# Patient Record
Sex: Male | Born: 1955
Health system: Southern US, Community
[De-identification: ages and names within clinical notes are randomized; demographics above are authoritative.]

## PROBLEM LIST (undated history)

## (undated) DIAGNOSIS — E119 Type 2 diabetes mellitus without complications: Secondary | ICD-10-CM

## (undated) DIAGNOSIS — T8859XA Other complications of anesthesia, initial encounter: Secondary | ICD-10-CM

## (undated) DIAGNOSIS — R011 Cardiac murmur, unspecified: Secondary | ICD-10-CM

## (undated) DIAGNOSIS — U071 COVID-19: Secondary | ICD-10-CM

## (undated) DIAGNOSIS — E785 Hyperlipidemia, unspecified: Secondary | ICD-10-CM

## (undated) DIAGNOSIS — G4730 Sleep apnea, unspecified: Secondary | ICD-10-CM

## (undated) DIAGNOSIS — I10 Essential (primary) hypertension: Secondary | ICD-10-CM

## (undated) HISTORY — PX: EYE SURGERY: SHX253

## (undated) HISTORY — DX: Type 2 diabetes mellitus without complications: E11.9

## (undated) HISTORY — DX: Cardiac murmur, unspecified: R01.1

## (undated) HISTORY — PX: CORNEAL TRANSPLANT: SHX108

## (undated) HISTORY — DX: Sleep apnea, unspecified: G47.30

## (undated) HISTORY — DX: Essential (primary) hypertension: I10

## (undated) HISTORY — DX: Hyperlipidemia, unspecified: E78.5

---

## 2004-05-31 ENCOUNTER — Ambulatory Visit: Payer: Self-pay | Admitting: Family Medicine

## 2004-06-11 ENCOUNTER — Encounter: Payer: Self-pay | Admitting: Family Medicine

## 2006-06-09 ENCOUNTER — Encounter: Payer: Self-pay | Admitting: Anesthesiology

## 2006-06-11 ENCOUNTER — Encounter: Payer: Self-pay | Admitting: Anesthesiology

## 2007-01-26 ENCOUNTER — Encounter: Payer: Self-pay | Admitting: General Practice

## 2007-02-11 ENCOUNTER — Encounter: Payer: Self-pay | Admitting: General Practice

## 2007-02-16 ENCOUNTER — Ambulatory Visit: Payer: Self-pay | Admitting: Family Medicine

## 2007-03-11 ENCOUNTER — Encounter: Payer: Self-pay | Admitting: General Practice

## 2007-12-21 ENCOUNTER — Ambulatory Visit: Payer: Self-pay | Admitting: Family Medicine

## 2008-01-09 ENCOUNTER — Ambulatory Visit: Payer: Self-pay | Admitting: Nephrology

## 2008-03-26 ENCOUNTER — Ambulatory Visit: Payer: Self-pay | Admitting: Family Medicine

## 2012-12-11 ENCOUNTER — Ambulatory Visit: Payer: Self-pay | Admitting: Unknown Physician Specialty

## 2012-12-11 LAB — BASIC METABOLIC PANEL
Anion Gap: 7 (ref 7–16)
BUN: 28 mg/dL — ABNORMAL HIGH (ref 7–18)
Calcium, Total: 9.2 mg/dL (ref 8.5–10.1)
Chloride: 100 mmol/L (ref 98–107)
Co2: 25 mmol/L (ref 21–32)
Creatinine: 1.58 mg/dL — ABNORMAL HIGH (ref 0.60–1.30)
EGFR (African American): 56 — ABNORMAL LOW
EGFR (Non-African Amer.): 48 — ABNORMAL LOW
Glucose: 396 mg/dL — ABNORMAL HIGH (ref 65–99)
Osmolality: 287 (ref 275–301)
Potassium: 3.7 mmol/L (ref 3.5–5.1)
Sodium: 132 mmol/L — ABNORMAL LOW (ref 136–145)

## 2012-12-14 ENCOUNTER — Encounter: Payer: Self-pay | Admitting: *Deleted

## 2012-12-17 ENCOUNTER — Encounter: Payer: Self-pay | Admitting: Cardiovascular Disease

## 2012-12-17 ENCOUNTER — Encounter: Payer: Self-pay | Admitting: *Deleted

## 2012-12-17 ENCOUNTER — Ambulatory Visit (INDEPENDENT_AMBULATORY_CARE_PROVIDER_SITE_OTHER): Payer: BC Managed Care – PPO | Admitting: Cardiovascular Disease

## 2012-12-17 ENCOUNTER — Encounter (INDEPENDENT_AMBULATORY_CARE_PROVIDER_SITE_OTHER): Payer: Self-pay

## 2012-12-17 VITALS — BP 138/94 | HR 76 | Ht 76.0 in | Wt 336.5 lb

## 2012-12-17 DIAGNOSIS — I10 Essential (primary) hypertension: Secondary | ICD-10-CM

## 2012-12-17 DIAGNOSIS — Z01818 Encounter for other preprocedural examination: Secondary | ICD-10-CM

## 2012-12-17 NOTE — Assessment & Plan Note (Addendum)
Trevor [name] presents for preoperative evaluation prior to having urologic surgery. Has history of hypertension, diabetes mellitus and hyperlipidemia. His son have a mildly abnormal EKG. His EKG reveals mild left ventricular hypertrophy. He was also found to have possible incomplete right bundle branch block.  He has no cardiac symptoms.  He's had an echocardiogram which reveals moderate left ventricular hypertrophy but otherwise the echocardiogram was fairly unremarkable.  We did discuss at some length a healthy diet and exercise regimen. I've given him some guidelines for exercise program and also some some dietary changes. He will followup with his general medical Dr. Diamantina Providence see him on an as-needed basis.   he is at low risk for his upcoming circumcision.

## 2012-12-17 NOTE — Progress Notes (Signed)
     Trevor [name] Date of Birth  15-Sep-1955       [facility] Office [address]  [address] [place], [place]  [address]   [place], [place]  [address] [phone]     [phone]   Fax  [phone]    Fax [phone]  Problem List: 1. Hypertension 2. Diabetes mellitus 3. Hyperlipidemia 4. Obstructive sleep apnea  History of Present Illness:  Trevor [name] is a 57 yo who presents for pre-op  Evaluation prior to having urologic surgery. He has a history of hypertension, diabetes and hyperlipidemia.  He's able to do all of his normal activities without any difficulties.  He does not get any regular exercise. 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  He eats a fairly unrestricted diet.    No current outpatient prescriptions on file prior to visit.   No current facility-administered medications on file prior to visit.    No Active Allergies  Past Medical History  Diagnosis Date  . Hyperlipidemia   . Diabetes mellitus without complication   . Heart murmur   . Hypertension   . Sleep apnea     Past Surgical History  Procedure Laterality Date  . Corneal transplant      History  Smoking status  . Never Smoker   Smokeless tobacco  . Not on file    History  Alcohol Use No    Family History  Problem Relation Age of Onset  . Family history unknown: Yes    Reviw of Systems:  Reviewed in the HPI.  All other systems are negative.  Physical Exam: [name] pressure 138/94, pulse 76, height 6\' 4"  (1.93 m), weight 336 lb 8 oz (152.635 kg). General: Well developed, well nourished, in no acute distress.  Head: Normocephalic, atraumatic, sclera non-icteric, mucus membranes are moist,   Neck: Supple. Carotids are 2 + without bruits. No JVD   Lungs: Clear   Heart: RR, normal S1, S2  Abdomen: Soft, non-tender, non-distended with normal bowel sounds.  Msk:  Strength and tone are normal   Extremities: No clubbing  or cyanosis. No edema.  Distal pedal pulses are 2+ and equal   Neuro: CN II - XII intact.  Alert and oriented X 3.   Psych:  Normal   ECG: Dec. 8,2014:  NSR at 77,  LAD   Assessment / Plan:

## 2012-12-17 NOTE — Patient Instructions (Addendum)
  Follow up as needed     [facility] Low Glycemic Diet (Source: [facility], 2006) Low Glycemic Foods (20-49) (Decrease risk of developing heart disease) Breakfast Cereals: All-Bran All-Bran Fruit 'n Oats Fiber One Oatmeal (not instant) Oat bran Fruits and fruit juices: (Limit to 1-2 servings per day) Apples Apricots (fresh & dried) Blackberries Blueberries Cherries Cranberries Peaches Pears Plums Prunes Grapefruit Raspberries Strawberries Tangerine Apple juice Grapefruit juice Tomato juice Beans and legumes (fresh-cooked): Black-eyed peas Butter beans Chick peas Lentils  Green beans Lima beans Kidney beans Navy beans Pinto beans Snow peas Non-starchy vegetables: Asparagus, avocado, broccoli, cabbage, cauliflower, celery, cucumber, greens, lettuce, mushrooms, peppers, tomatoes, okra, onions, spinach, summer squash Grains: Barley Bulgur Rye Wild rice Nuts and oils : Almonds Peanuts Sunflower seeds Hazelnuts Pecans Walnuts Oils that are liquid at room temperature Dairy, fish, meat, soy, and eggs: Milk, skim Lowfat cheese Yogurt, lowfat, fruit sugar sweetened Lean red meat Fish  Skinless chicken & [place] Shellfish Egg whites (up to 3 daily) Soy products  Egg yolks (up to 7 or _____ per week) Moderate Glycemic Foods (50-69) Breakfast Cereals: Bran Buds Bran Chex Just Right Mini-Wheats  Special K Swiss muesli Fruits: Banana (under-ripe) Dates Figs Grapes Kiwi Mango Oranges Raisins Fruit Juices: Cranberry juice Orange juice Beans and legumes: Boston-type baked beans Canned pinto, kidney, or navy beans Green peas Vegetables: Beets Carrots  Sweet potato Yam Corn on the cob Breads: Pita (pocket) bread Oat bran bread Pumpernickel bread Rye bread Wheat bread, high fiber  Grains: Cornmeal Rice, brown Rice, white Couscous Pasta: Macaroni Pizza, cheese Ravioli, meat filled Spaghetti, white  Nuts: Cashews Macadamia Snacks: Chocolate  Ice cream, lowfat Muffin Popcorn High Glycemic Foods (70-100)  Breakfast Cereals: Cheerios Corn Chex Corn Flakes Cream of Wheat Grape Nuts Grape Nut Flakes Grits Nutri-Grain Puffed Rice Puffed Wheat Rice Chex Rice Krispies Shredded Wheat Team Total Fruits: Pineapple Watermelon Banana (over-ripe) Beverages: Sodas, sweet tea, pineapple juice Vegetables: Potato, baked, boiled, fried, mashed [place] fries Canned or frozen corn Parsnips Winter squash Breads: Most breads (white and whole grain) Bagels Bread sticks Bread stuffing Kaiser roll Dinner rolls Grains: Rice, instant Tapioca, with milk Candy and most cookies Snacks: Donuts Corn chips Jelly beans Pretzels Pastries

## 2012-12-21 ENCOUNTER — Ambulatory Visit: Payer: Self-pay | Admitting: Urology

## 2013-01-04 ENCOUNTER — Encounter: Payer: Self-pay | Admitting: *Deleted

## 2013-05-10 ENCOUNTER — Ambulatory Visit (INDEPENDENT_AMBULATORY_CARE_PROVIDER_SITE_OTHER): Payer: BC Managed Care – PPO | Admitting: Podiatry

## 2013-05-10 ENCOUNTER — Encounter: Payer: Self-pay | Admitting: Podiatry

## 2013-05-10 VITALS — Resp 16 | Ht 76.0 in | Wt 333.0 lb

## 2013-05-10 DIAGNOSIS — M79609 Pain in unspecified limb: Secondary | ICD-10-CM

## 2013-05-10 DIAGNOSIS — Q828 Other specified congenital malformations of skin: Secondary | ICD-10-CM

## 2013-05-10 DIAGNOSIS — E1159 Type 2 diabetes mellitus with other circulatory complications: Secondary | ICD-10-CM

## 2013-05-10 DIAGNOSIS — B351 Tinea unguium: Secondary | ICD-10-CM

## 2013-05-10 DIAGNOSIS — M2010 Hallux valgus (acquired), unspecified foot: Secondary | ICD-10-CM

## 2013-05-10 NOTE — Progress Notes (Signed)
   Subjective:    Patient ID: Trevor [name], male    DOB: [birth year], 58 y.o.   MRN: [id number]  HPI Comments: i need my toenails and callus trimmed. My toenails do not hurt but my callus does hurt. They have been growing out for years. i shave the callus off and i have pedicures.      Review of Systems  Constitutional:       Weight change  All other systems reviewed and are negative.      Objective:   Physical Exam        Assessment & Plan:

## 2013-05-12 NOTE — Progress Notes (Signed)
Subjective:     Patient ID: Trevor [name], male   DOB: 23-Aug-1955, 58 y.o.   MRN: [id number]  HPI patient presents stating that he is a diabetic and cannot take care of his nails or his calluses and they become bothersome and make it hard for him to walk. Also complains of structural bunion deformity right over left it can bother him at different times and that this is been an ongoing issue   Review of Systems  All other systems reviewed and are negative.      Objective:   Physical Exam  Nursing note and vitals reviewed. Constitutional: He is oriented to person, place, and time.  Cardiovascular: Intact distal pulses.   Musculoskeletal: Normal range of motion.  Neurological: He is oriented to person, place, and time.  Skin: Skin is dry.   patient is found to have diminishment of circulatory status DP and PT pulses with digits well perfused and slight diminishment in hair growth. Neurological there is mild diminishment of sharp toe vibratory and there is found to be normal range of motion and slightly reduce muscle strength of all extensors and flexors. 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 I have recommended debridement which was accomplished of the lesions on both feet and nailbeds 1-5 both feet with no bleeding noted and long-term diabetic shoes to try to prevent him from ulcerating. He is casted for diabetic shoes

## 2013-06-21 ENCOUNTER — Ambulatory Visit (INDEPENDENT_AMBULATORY_CARE_PROVIDER_SITE_OTHER): Payer: BC Managed Care – PPO | Admitting: *Deleted

## 2013-06-21 ENCOUNTER — Encounter: Payer: Self-pay | Admitting: *Deleted

## 2013-06-21 VITALS — BP 167/97 | HR 81 | Resp 16

## 2013-06-21 DIAGNOSIS — E1159 Type 2 diabetes mellitus with other circulatory complications: Secondary | ICD-10-CM

## 2013-06-21 DIAGNOSIS — M2010 Hallux valgus (acquired), unspecified foot: Secondary | ICD-10-CM

## 2013-06-21 NOTE — Progress Notes (Signed)
Pt presents to pick up diabetic shoes. Shoes do fit patient properly.

## 2013-09-13 ENCOUNTER — Ambulatory Visit: Payer: BC Managed Care – PPO | Admitting: Podiatry

## 2013-09-24 DIAGNOSIS — M79609 Pain in unspecified limb: Secondary | ICD-10-CM

## 2013-12-13 ENCOUNTER — Ambulatory Visit: Payer: BC Managed Care – PPO | Admitting: Podiatry

## 2014-05-02 NOTE — Op Note (Signed)
PATIENT NAME:  AKAI, DOLLARD MR#:  [id number] DATE OF BIRTH:  11/15/1955  DATE OF PROCEDURE:  12/21/2012   PREOPERATIVE DIAGNOSIS:  Phimosis.   POSTOPERATIVE DIAGNOSIS:  Phimosis.  PROCEDURE:  Adult circumcision.   ANESTHESIA: General.   DESCRIPTION OF PROCEDURE:  The patient was sterilely prepped and draped in the supine position to do a circumferential block in the base of the penis. I marked the area to be excised from his foreskin. Grouped the foreskin making it very flat, around circumferentially and then with a sharp knife and a #15 Bard-Parker blade circumferential incision on the skin. I then incised around the lateral bands all the way around so I can remove the underlying fascia and foreskin. Foreskin is not sent for pathology. Bleeding is controlled with electrocautery. The subcoronal mucosa is reattached to the skin of the penis utilizing interrupted 2-0 chromic sutures. A sterile dressing was placed and the patient sent to recovery room in satisfactory condition.    ____________________________ Janice Coffin. Elnoria Howard, [place] rdh:ce D: 12/21/2012 19:40:47 ET T: 12/21/2012 20:01:05 ET JOB#: [id number]  cc: Janice Coffin. Elnoria Howard, DO, <Dictator> RICHARD D HART DO ELECTRONICALLY SIGNED 12/28/2012 15:48

## 2015-02-13 DIAGNOSIS — M48061 Spinal stenosis, lumbar region without neurogenic claudication: Secondary | ICD-10-CM | POA: Insufficient documentation

## 2015-03-27 ENCOUNTER — Encounter (INDEPENDENT_AMBULATORY_CARE_PROVIDER_SITE_OTHER): Payer: Self-pay

## 2015-03-27 ENCOUNTER — Ambulatory Visit: Payer: Self-pay

## 2016-08-09 ENCOUNTER — Telehealth: Payer: Self-pay | Admitting: Pharmacist

## 2016-08-09 NOTE — Telephone Encounter (Signed)
08/09/16 Faxed Novo Nordisk form for Dose Change on Levemir Vials Max daily dose 120 units, Inject 60 units under the skin two times daily, # 15 vials. [name]

## 2016-09-01 ENCOUNTER — Ambulatory Visit: Payer: Self-pay | Admitting: Pharmacist

## 2016-09-01 ENCOUNTER — Encounter: Payer: Self-pay | Admitting: Pharmacist

## 2016-09-01 ENCOUNTER — Encounter (INDEPENDENT_AMBULATORY_CARE_PROVIDER_SITE_OTHER): Payer: Self-pay

## 2016-09-01 VITALS — BP 140/79 | Wt 326.0 lb

## 2016-09-01 DIAGNOSIS — Z79899 Other long term (current) drug therapy: Secondary | ICD-10-CM

## 2016-09-01 NOTE — Progress Notes (Signed)
[facility] Visit Note  Patient: Trevor [name] MRN: [id number] Date of Birth: [birth year] PCP: [name], MD   Trevor [name] 61 y.o. male presents for an initial MTM visit today.  BP 140/79   Wt (!) 326 lb (147.9 kg)   BMI 39.68 kg/m   Patient Information   Past Medical History:  Diagnosis Date  . Diabetes mellitus without complication ([place])   . Heart murmur   . Hyperlipidemia   . Hypertension   . Sleep apnea       Past Surgical History:  Procedure Laterality Date  . CORNEAL TRANSPLANT      No family history on file.  New Diagnoses (since last visit): n/a   Lifestyle Diet: Breakfast:over easy eggs with brown rice Lunch:skips Dinner:chicken and veggies, summer squash, zucchni, onions Drinks:kool-aid w/stevia, soda    Current Exercise Habits: The patient does not participate in regular exercise at present       History  Alcohol Use No      History  Smoking Status  . Never Smoker  Smokeless Tobacco  . Never Used      Health Maintenance  Topic Date Due  . HEMOGLOBIN A1C  October 28, 1955  . Hepatitis C Screening  11-May-1955  . PNEUMOCOCCAL POLYSACCHARIDE VACCINE (1) 05/20/1958  . FOOT EXAM  05/20/1966  . OPHTHALMOLOGY EXAM  05/20/1966  . HIV Screening  05/20/1971  . TETANUS/TDAP  05/20/1975  . COLONOSCOPY  05/20/2006  . INFLUENZA VACCINE  08/10/2016   Outpatient Encounter Prescriptions as of 09/01/2016  Medication Sig  . atenolol (TENORMIN) 100 MG tablet Take 100 mg by mouth daily.  [name] atorvastatin (LIPITOR) 10 MG tablet Take 10 mg by mouth at bedtime.  . cyclobenzaprine (FLEXERIL) 10 MG tablet Take 10 mg by mouth 3 (three) times daily as needed for muscle spasms.  . fluticasone (FLONASE) 50 MCG/ACT nasal spray Place 2 sprays into both nostrils daily.  . furosemide (LASIX) 40 MG tablet Take 40 mg by mouth as needed.  [name] glipiZIDE (GLUCOTROL) 10 MG tablet Take 10 mg by mouth 2 (two) times daily.  . hydrochlorothiazide  (HYDRODIURIL) 25 MG tablet Take 25 mg by mouth daily.  . insulin detemir (LEVEMIR) 100 UNIT/ML injection Inject 60 Units into the skin 2 (two) times daily.  [name] losartan (COZAAR) 25 MG tablet Take 100 mg by mouth daily.  . meloxicam (MOBIC) 15 MG tablet Take 15 mg by mouth daily.  . metFORMIN (GLUCOPHAGE) 1000 MG tablet Take 1,000 mg by mouth 2 (two) times daily.  [name] NIFEdipine (PROCARDIA XL/ADALAT-CC) 90 MG 24 hr tablet Take 90 mg by mouth daily.  . potassium chloride SA (K-DUR,KLOR-CON) 20 MEQ tablet Take 20 mEq by mouth 2 (two) times daily.  . traMADol (ULTRAM) 50 MG tablet Take 50 mg by mouth every 6 (six) hours as needed.  . [DISCONTINUED] atenolol (TENORMIN) 100 MG tablet Take 100 mg by mouth daily.  . [DISCONTINUED] furosemide (LASIX) 40 MG tablet Take 40 mg by mouth daily.  . [DISCONTINUED] glipiZIDE (GLUCOTROL) 10 MG tablet Take 10 mg by mouth 2 (two) times daily before a meal.   . [DISCONTINUED] LOSARTAN POTASSIUM PO Take by mouth daily.  . [DISCONTINUED] metFORMIN (GLUCOPHAGE) 1000 MG tablet Take 1,000 mg by mouth 2 (two) times daily with a meal.  . [DISCONTINUED] NIFEdipine (PROCARDIA XL/ADALAT-CC) 90 MG 24 hr tablet   . [DISCONTINUED] potassium chloride SA (K-DUR,KLOR-CON) 20 MEQ tablet Take 20 mEq by mouth 2 (two) times daily.  . [DISCONTINUED] spironolactone (  ALDACTONE) 25 MG tablet Take 25 mg by mouth daily.   No facility-administered encounter medications on file as of 09/01/2016.     Assessment and Plan:  DM: could not find recent A1c. Pt states that he checks BG 2x/day. BG range 180 in the am high 300's twice a week.  HTN: BP today was 140/79 pt at goal BP of <140/90. Stable on regimen of ARB, CCB, HCTZ. Pt denies any SEs such as dizziness or h/a.  HLD: pt on Atrovastatin and pt is on a low-fat diet.  Sleep Apnea: pt states he sleeps about 6 hours a day but it many times has trouble falling asleep. Pt is not on a CPAP, does not have a tv in his room. He does work 3rd shift  and this can contribute to his sleep issues.  Chronic Back Pain: pain today 0/10. Denies discomfort while standing and walking. States that he does  Not get much relief from Tramadol in the past. Has a current RX for prednisone which he states he stopped taking. Counseled pt that prednisone may increase BG but would resolve after treatment is finished. Also reminded pt that he could see some mild swelling also.   Netta Neat, PharmD, [facility]) [phone]

## 2016-09-07 ENCOUNTER — Telehealth: Payer: Self-pay | Admitting: Pharmacy Technician

## 2016-09-07 NOTE — Telephone Encounter (Signed)
Patient eligible to receive medication assistance at [facility] through 2018, as long as eligibility requirements continue to be met.  Zachrey Deutscher J. Tavia Stave Care Manager [facility] 

## 2016-10-14 ENCOUNTER — Telehealth: Payer: Self-pay | Admitting: Pharmacist

## 2016-10-14 NOTE — Telephone Encounter (Signed)
10/14/16 Received patient portion of Novo Nordisk application back, faxed complete application to [employer] for [employer] Max daily dose 120 units--Inject 60 units under the skin two times daily # 150 vials.[name]

## 2016-11-18 ENCOUNTER — Ambulatory Visit: Payer: Self-pay | Admitting: Nurse Practitioner

## 2016-11-22 ENCOUNTER — Ambulatory Visit: Payer: Self-pay | Attending: Nurse Practitioner | Admitting: Nurse Practitioner

## 2016-11-22 ENCOUNTER — Encounter: Payer: Self-pay | Admitting: Nurse Practitioner

## 2016-11-22 ENCOUNTER — Ambulatory Visit: Payer: Self-pay | Admitting: Licensed Clinical Social Worker

## 2016-11-22 VITALS — BP 102/68 | HR 64 | Temp 98.3°F | Resp 18 | Ht 76.0 in | Wt 327.0 lb

## 2016-11-22 DIAGNOSIS — F439 Reaction to severe stress, unspecified: Secondary | ICD-10-CM

## 2016-11-22 DIAGNOSIS — L97519 Non-pressure chronic ulcer of other part of right foot with unspecified severity: Secondary | ICD-10-CM | POA: Insufficient documentation

## 2016-11-22 DIAGNOSIS — Z794 Long term (current) use of insulin: Secondary | ICD-10-CM | POA: Insufficient documentation

## 2016-11-22 DIAGNOSIS — E785 Hyperlipidemia, unspecified: Secondary | ICD-10-CM | POA: Insufficient documentation

## 2016-11-22 DIAGNOSIS — E1165 Type 2 diabetes mellitus with hyperglycemia: Secondary | ICD-10-CM | POA: Insufficient documentation

## 2016-11-22 DIAGNOSIS — E1169 Type 2 diabetes mellitus with other specified complication: Secondary | ICD-10-CM | POA: Insufficient documentation

## 2016-11-22 DIAGNOSIS — Z79899 Other long term (current) drug therapy: Secondary | ICD-10-CM | POA: Insufficient documentation

## 2016-11-22 DIAGNOSIS — G4730 Sleep apnea, unspecified: Secondary | ICD-10-CM | POA: Insufficient documentation

## 2016-11-22 DIAGNOSIS — R011 Cardiac murmur, unspecified: Secondary | ICD-10-CM | POA: Insufficient documentation

## 2016-11-22 DIAGNOSIS — G4733 Obstructive sleep apnea (adult) (pediatric): Secondary | ICD-10-CM | POA: Insufficient documentation

## 2016-11-22 DIAGNOSIS — E13621 Other specified diabetes mellitus with foot ulcer: Secondary | ICD-10-CM

## 2016-11-22 DIAGNOSIS — E11621 Type 2 diabetes mellitus with foot ulcer: Secondary | ICD-10-CM | POA: Insufficient documentation

## 2016-11-22 DIAGNOSIS — L97509 Non-pressure chronic ulcer of other part of unspecified foot with unspecified severity: Secondary | ICD-10-CM

## 2016-11-22 DIAGNOSIS — I10 Essential (primary) hypertension: Secondary | ICD-10-CM | POA: Insufficient documentation

## 2016-11-22 LAB — POCT URINALYSIS DIPSTICK

## 2016-11-22 LAB — POCT GLYCOSYLATED HEMOGLOBIN (HGB A1C): HEMOGLOBIN A1C: 10.2

## 2016-11-22 LAB — GLUCOSE, POCT (MANUAL RESULT ENTRY): POC Glucose: 164 mg/dl — AB (ref 70–99)

## 2016-11-22 MED ORDER — GLUCOSE BLOOD VI STRP: ORAL_STRIP | 12 refills | Status: DC

## 2016-11-22 MED FILL — TRUE METRIX TEST STRIP: 30 days supply | Qty: 100 | Fill #0

## 2016-11-22 NOTE — Patient Instructions (Addendum)
Cholesterol Cholesterol is a fat. Your body needs a small amount of cholesterol. Cholesterol (plaque) may build up in your blood vessels (arteries). That makes you more likely to have a heart attack or stroke. You cannot feel your cholesterol level. Having a blood test is the only way to find out if your level is high. Keep your test results. Work with your doctor to keep your cholesterol at a good level. What do the results mean?  Total cholesterol is how much cholesterol is in your blood.  LDL is bad cholesterol. This is the type that can build up. Try to have low LDL.  HDL is good cholesterol. It cleans your blood vessels and carries LDL away. Try to have high HDL.  Triglycerides are fat that the body can store or burn for energy. What are good levels of cholesterol?  Total cholesterol below 200.  LDL below 100 is good for people who have health risks. LDL below 70 is good for people who have very high risks.  HDL above 40 is good. It is best to have HDL of 60 or higher.  Triglycerides below 150. How can I lower my cholesterol? Diet Follow your diet program as told by your doctor.  Choose fish, white meat chicken, or [place] that is roasted or baked. Try not to eat red meat, fried foods, sausage, or lunch meats.  Eat lots of fresh fruits and vegetables.  Choose whole grains, beans, pasta, potatoes, and cereals.  Choose olive oil, corn oil, or canola oil. Only use small amounts.  Try not to eat butter, mayonnaise, shortening, or palm kernel oils.  Try not to eat foods with trans fats.  Choose low-fat or nonfat dairy foods. ? Drink skim or nonfat milk. ? Eat low-fat or nonfat yogurt and cheeses. ? Try not to drink whole milk or cream. ? Try not to eat ice cream, egg yolks, or full-fat cheeses.  Healthy desserts include angel food cake, ginger snaps, animal crackers, hard candy, popsicles, and low-fat or nonfat frozen yogurt. Try not to eat pastries, cakes, pies, and  cookies.  Exercise Follow your exercise program as told by your doctor.  Be more active. Try gardening, walking, and taking the stairs.  Ask your doctor about ways that you can be more active.  Medicine  Take over-the-counter and prescription medicines only as told by your doctor.  This information is not intended to replace advice given to you by your health care provider. Make sure you discuss any questions you have with your health care provider. Document Released: 03/25/2008 Document Revised: 07/29/2015 Document Reviewed: 07/09/2015 Elsevier Interactive Patient Education  2017 [employer]  Dyslipidemia Dyslipidemia is an imbalance of waxy, fat-like substances (lipids) in the blood. The body needs lipids in small amounts. Dyslipidemia often involves a high level of cholesterol or triglycerides, which are types of lipids. Common forms of dyslipidemia include:  High levels of bad cholesterol (LDL cholesterol). LDL is the type of cholesterol that causes fatty deposits (plaques) to build up in the blood vessels that carry blood away from your heart (arteries).  Low levels of good cholesterol (HDL cholesterol). HDL cholesterol is the type of cholesterol that protects against heart disease. High levels of HDL remove the LDL buildup from arteries.  High levels of triglycerides. Triglycerides are a fatty substance in the blood that is linked to a buildup of plaques in the arteries.  You can develop dyslipidemia because of the genes you are born with (primary dyslipidemia) or changes that  occur during your life (secondary dyslipidemia), or as a side effect of certain medical treatments. What are the causes? Primary dyslipidemia is caused by changes (mutations) in genes that are passed down through families (inherited). These mutations cause several types of dyslipidemia. Mutations can result in disorders that make the body produce too much LDL cholesterol or triglycerides, or not enough HDL  cholesterol. These disorders may lead to heart disease, arterial disease, or stroke at an early age. Causes of secondary dyslipidemia include certain lifestyle choices and diseases that lead to dyslipidemia, such as:  Eating a diet that is high in animal fat.  Not getting enough activity or exercise (having a sedentary lifestyle).  Having diabetes, kidney disease, liver disease, or thyroid disease.  Drinking large amounts of alcohol.  Using certain types of drugs.  What increases the risk? You may be at greater risk for dyslipidemia if you are an older man or if you are a woman who has gone through menopause. Other risk factors include:  Having a family history of dyslipidemia.  Taking certain medicines, including birth control pills, steroids, some diuretics, beta-blockers, and some medicines forHIV.  Smoking cigarettes.  Eating a high-fat diet.  Drinking large amounts of alcohol.  Having certain medical conditions such as diabetes, polycystic ovary syndrome (PCOS), pregnancy, kidney disease, liver disease, or hypothyroidism.  Not exercising regularly.  Being overweight or obese with too much belly fat.  What are the signs or symptoms? Dyslipidemia does not usually cause any symptoms. Very high lipid levels can cause fatty bumps under the skin (xanthomas) or a white or gray ring around the black center (pupil) of the eye. Very high triglyceride levels can cause inflammation of the pancreas (pancreatitis). How is this diagnosed? Your health care provider may diagnose dyslipidemia based on a routine blood test (fasting blood test). Because most people do not have symptoms of the condition, this blood testing (lipid profile) is done on adults age 58 and older and is repeated every 5 years. This test checks:  Total cholesterol. This is a measure of the total amount of cholesterol in your blood, including LDL cholesterol, HDL cholesterol, and triglycerides. A healthy number is  below 200.  LDL cholesterol. The target number for LDL cholesterol is different for each person, depending on individual risk factors. For most people, a number below 100 is healthy. Ask your health care provider what your LDL cholesterol number should be.  HDL cholesterol. An HDL level of 60 or higher is best because it helps to protect against heart disease. A number below 48 for men or below 57 for women increases the risk for heart disease.  Triglycerides. A healthy triglyceride number is below 150.  If your lipid profile is abnormal, your health care provider may do other blood tests to get more information about your condition. How is this treated? Treatment depends on the type of dyslipidemia that you have and your other risk factors for heart disease and stroke. Your health care provider will have a target range for your lipid levels based on this information. For many people, treatment starts with lifestyle changes, such as diet and exercise. Your health care provider may recommend that you:  Get regular exercise.  Make changes to your diet.  Quit smoking if you smoke.  If diet changes and exercise do not help you reach your goals, your health care provider may also prescribe medicine to lower lipids. The most commonly prescribed type of medicine lowers your LDL cholesterol (statin drug). If  you have a high triglyceride level, your provider may prescribe another type of drug (fibrate) or an omega-3 fish oil supplement, or both. Follow these instructions at home:  Take over-the-counter and prescription medicines only as told by your health care provider. This includes supplements.  Get regular exercise. Start an aerobic exercise and strength training program as told by your health care provider. Ask your health care provider what activities are safe for you. Your health care provider may recommend: ? 30 minutes of aerobic activity 4-6 days a week. Brisk walking is an example of  aerobic activity. ? Strength training 2 days a week.  Eat a healthy diet as told by your health care provider. This can help you reach and maintain a healthy weight, lower your LDL cholesterol, and raise your HDL cholesterol. It may help to work with a diet and nutrition specialist (dietitian) to make a plan that is right for you. Your dietitian or health care provider may recommend: ? Limiting your calories, if you are overweight. ? Eating more fruits, vegetables, whole grains, fish, and lean meats. ? Limiting saturated fat, trans fat, and cholesterol.  Follow instructions from your health care provider or dietitian about eating or drinking restrictions.  Limit alcohol intake to no more than one drink per day for nonpregnant women and two drinks per day for men. One drink equals 12 oz of beer, 5 oz of wine, or 1 oz of hard liquor.  Do not use any products that contain nicotine or tobacco, such as cigarettes and e-cigarettes. If you need help quitting, ask your health care provider.  Keep all follow-up visits as told by your health care provider. This is important. Contact a health care provider if:  You are having trouble sticking to your exercise or diet plan.  You are struggling to quit smoking or control your use of alcohol. Summary  Dyslipidemia is an imbalance of waxy, fat-like substances (lipids) in the blood. The body needs lipids in small amounts. Dyslipidemia often involves a high level of cholesterol or triglycerides, which are types of lipids.  Treatment depends on the type of dyslipidemia that you have and your other risk factors for heart disease and stroke.  For many people, treatment starts with lifestyle changes, such as diet and exercise. Your health care provider may also prescribe medicine to lower lipids. This information is not intended to replace advice given to you by your health care provider. Make sure you discuss any questions you have with your health care  provider. Document Released: 01/01/2013 Document Revised: 08/24/2015 Document Reviewed: 08/24/2015 Elsevier Interactive Patient Education  2018 [employer].  How to Avoid Diabetes Mellitus Problems You can take action to prevent or slow down problems that are caused by diabetes (diabetes mellitus). Following your diabetes plan and taking care of yourself can reduce your risk of serious or life-threatening complications. Manage your diabetes  Follow instructions from your health care providers about managing your diabetes. Your diabetes may be managed by a team of health care providers who can teach you how to care for yourself and can answer questions that you have.  Educate yourself about your condition so you can make healthy choices about eating and physical activity.  Check your blood sugar (glucose) levels as often as directed. Your health care provider will help you decide how often to check your blood glucose level depending on your treatment goals and how well you are meeting them.  Ask your health care provider if you should take  low-dose aspirin daily and what dose is recommended for you. Taking low-dose aspirin daily is recommended to help prevent cardiovascular disease. Do not use nicotine or tobacco Do not use any products that contain nicotine or tobacco, such as cigarettes and e-cigarettes. If you need help quitting, ask your health care provider. Nicotine raises your risk for diabetes problems. If you quit using nicotine:  You will lower your risk for heart attack, stroke, nerve disease, and kidney disease.  Your cholesterol and blood pressure may improve.  Your blood circulation will improve.  Keep your blood pressure under control To control your blood pressure:  Follow instructions from your health care provider about meal planning, exercise, and medicines.  Make sure your health care provider checks your blood pressure at every medical visit.  A blood pressure  reading consists of two numbers. Generally, the goal is to keep your top number (systolic pressure) at or below 130, and your bottom number (diastolic pressure) at or below 80. Your health care provider may recommend a lower target blood pressure. Your individualized target blood pressure is determined based on:  Your age.  Your medicines.  How long you have had diabetes.  Any other medical conditions you have.  Keep your cholesterol under control To control your cholesterol:  Follow instructions from your health care provider about meal planning, exercise, and medicines.  Have your cholesterol checked at least once a year.  You may be prescribed medicine to lower cholesterol (statin). If you are not taking a statin, ask your health care provider if you should be.  Controlling your cholesterol may:  Help prevent heart disease and stroke. These are the most common health problems for people with diabetes.  Improve your blood flow.  Schedule and keep yearly physical exams and eye exams Your health care provider will tell you how often you need medical visits depending on your diabetes management plan. Keep all follow-up visits as directed. This is important so possible problems can be identified early and complications can be avoided or treated.  Every visit with your health care provider should include measuring your: ? Weight. ? Blood pressure. ? Blood glucose control.  Your A1c (hemoglobin A1c) level should be checked: ? At least 2 times a year, if you are meeting your treatment goals. ? 4 times a year, if you are not meeting treatment goals or if your treatment goals have changed.  Your blood lipids (lipid profile) should be checked yearly. You should also be checked yearly for protein in your urine (urine microalbumin).  If you have type 1 diabetes, get an eye exam 3-5 years after you are diagnosed, and then once a year after your first exam.  If you have type 2 diabetes,  get an eye exam as soon as you are diagnosed, and then once a year after your first exam.  Keep your vaccines current It is recommended that you receive:  pneumonia (pneumococcal) vaccine and a hepatitis B vaccine. If you are age 103 or older, you may get the pneumonia vaccine as a series of two separate shots.  Ask your health care provider which other vaccines may be recommended. Take care of your feet Diabetes may cause you to have poor blood circulation to your legs and feet. Because of this, taking care of your feet is very important. Diabetes can cause:  The skin on the feet to get thinner, break more easily, and heal more slowly.  Nerve damage in your legs and feet, which results in  decreased feeling. You may not notice minor injuries that could lead to serious problems.  To avoid foot problems:  Check your skin and feet every day for cuts, bruises, redness, blisters, or sores.  Schedule a foot exam with your health care provider once every year. This exam includes: ? Inspecting of the structure and skin of your feet. ? Checking the pulses and sensation in your feet.  Make sure that your health care provider performs a visual foot exam at every medical visit.  Take care of your teeth People with poorly controlled diabetes are more likely to have gum (periodontal) disease. Diabetes can make periodontal diseases harder to control. If not treated, periodontal diseases can lead to tooth loss. To prevent this:  Brush your teeth twice a day.  Floss at least once a day.  Visit your dentist 2 times a year.  Drink responsibly Limit alcohol intake to no more than 1 drink a day for nonpregnant women and 2 drinks a day for men. One drink equals 12 oz of beer, 5 oz of wine, or 1 oz of hard liquor. It is important to eat food when you drink alcohol to avoid low blood glucose (hypoglycemia). Avoid alcohol if you:  Have a history of alcohol abuse or dependence.  Are pregnant.  Have  liver disease, pancreatitis, advanced neuropathy, or severe hypertriglyceridemia.  Lessen stress Living with diabetes can be stressful. When you are experiencing stress, your blood glucose may be affected in two ways:  Stress hormones may cause your blood glucose to rise.  You may be distracted from taking good care of yourself.  Be aware of your stress level and make changes to help you manage challenging situations. To lower your stress levels:  Consider joining a support group.  Do planned relaxation or meditation.  Do a hobby that you enjoy.  Maintain healthy relationships.  Exercise regularly.  Work with your health care provider or a mental health professional.  Summary  You can take action to prevent or slow down problems that are caused by diabetes (diabetes mellitus). Following your diabetes plan and taking care of yourself can reduce your risk of serious or life-threatening complications.  Follow instructions from your health care providers about managing your diabetes. Your diabetes may be managed by a team of health care providers who can teach you how to care for yourself and can answer questions that you have.  Your health care provider will tell you how often you need medical visits depending on your diabetes management plan. Keep all follow-up visits as directed. This is important so possible problems can be identified early and complications can be avoided or treated. This information is not intended to replace advice given to you by your health care provider. Make sure you discuss any questions you have with your health care provider. Document Released: 09/14/2010 Document Revised: 09/26/2015 Document Reviewed: 09/26/2015 Elsevier Interactive Patient Education  2018 [employer].  Hypertension Hypertension is another name for high blood pressure. High blood pressure forces your heart to work harder to pump blood. This can cause problems over time. There are two  numbers in a blood pressure reading. There is a top number (systolic) over a bottom number (diastolic). It is best to have a blood pressure below 120/80. Healthy choices can help lower your blood pressure. You may need medicine to help lower your blood pressure if:  Your blood pressure cannot be lowered with healthy choices.  Your blood pressure is higher than 130/80.  Follow  these instructions at home: Eating and drinking  If directed, follow the DASH eating plan. This diet includes: ? Filling half of your plate at each meal with fruits and vegetables. ? Filling one quarter of your plate at each meal with whole grains. Whole grains include whole wheat pasta, brown rice, and whole grain bread. ? Eating or drinking low-fat dairy products, such as skim milk or low-fat yogurt. ? Filling one quarter of your plate at each meal with low-fat (lean) proteins. Low-fat proteins include fish, skinless chicken, eggs, beans, and tofu. ? Avoiding fatty meat, cured and processed meat, or chicken with skin. ? Avoiding premade or processed food.  Eat less than 1,500 mg of salt (sodium) a day.  Limit alcohol use to no more than 1 drink a day for nonpregnant women and 2 drinks a day for men. One drink equals 12 oz of beer, 5 oz of wine, or 1 oz of hard liquor. Lifestyle  Work with your doctor to stay at a healthy weight or to lose weight. Ask your doctor what the best weight is for you.  Get at least 30 minutes of exercise that causes your heart to beat faster (aerobic exercise) most days of the week. This may include walking, swimming, or biking.  Get at least 30 minutes of exercise that strengthens your muscles (resistance exercise) at least 3 days a week. This may include lifting weights or pilates.  Do not use any products that contain nicotine or tobacco. This includes cigarettes and e-cigarettes. If you need help quitting, ask your doctor.  Check your blood pressure at home as told by your  doctor.  Keep all follow-up visits as told by your doctor. This is important. Medicines  Take over-the-counter and prescription medicines only as told by your doctor. Follow directions carefully.  Do not skip doses of blood pressure medicine. The medicine does not work as well if you skip doses. Skipping doses also puts you at risk for problems.  Ask your doctor about side effects or reactions to medicines that you should watch for. Contact a doctor if:  You think you are having a reaction to the medicine you are taking.  You have headaches that keep coming back (recurring).  You feel dizzy.  You have swelling in your ankles.  You have trouble with your vision. Get help right away if:  You get a very bad headache.  You start to feel confused.  You feel weak or numb.  You feel faint.  You get very bad pain in your: ? Chest. ? Belly (abdomen).  You throw up (vomit) more than once.  You have trouble breathing. Summary  Hypertension is another name for high blood pressure.  Making healthy choices can help lower blood pressure. If your blood pressure cannot be controlled with healthy choices, you may need to take medicine. This information is not intended to replace advice given to you by your health care provider. Make sure you discuss any questions you have with your health care provider. Document Released: 06/15/2007 Document Revised: 11/25/2015 Document Reviewed: 11/25/2015 Elsevier Interactive Patient Education  2018 [place] for Eating Away From Home If You Have Diabetes Controlling your level of blood glucose, also known as blood sugar, can be challenging. It can be even more difficult when you do not prepare your own meals. The following tips can help you manage your diabetes when you eat away from home. Planning ahead Plan ahead if you know you will be eating  away from home:  Ask your health care provider how to time meals and medicine if you are  taking insulin.  Make a list of restaurants near you that offer healthy choices. If they have a carry-out menu, take it home and plan what you will order ahead of time.  Look up the restaurant you want to eat at online. Many chain and fast-food restaurants list nutritional information online. Use this information to choose the healthiest options and to calculate how many carbohydrates will be in your meal.  Use a carbohydrate-counting book or mobile app to look up the carbohydrate content and serving size of the foods you want to eat.  Become familiar with serving sizes and learn to recognize how many servings are in a portion. This will allow you to estimate how many carbohydrates you can eat.  Free foods A "free food" is any food or drink that has less than 5 g of carbohydrates per serving. Free foods include:  Many vegetables.  Hard boiled eggs.  Nuts or seeds.  Olives.  Cheeses.  Meats.  These types of foods make good appetizer choices and are often available at salad bars. Lemon juice, vinegar, or a low-calorie salad dressing of fewer than 20 calories per serving can be used as a "free" salad dressing. Choices to reduce carbohydrates  Substitute nonfat sweetened yogurt with a sugar-free yogurt. Yogurt made from soy milk may also be used, but you will still want a sugar-free or plain option to choose a lower carbohydrate amount.  Ask your server to take away the bread basket or chips from your table.  Order fresh fruit. A salad bar often offers fresh fruit choices. Avoid canned fruit because it is usually packed in sugar or syrup.  Order a salad, and eat it without dressing. Or, create a "free" salad dressing.  Ask for substitutions. For example, instead of [place] fries, request an order of a vegetable such as salad, green beans, or broccoli. Other tips  If you take insulin, take the insulin once your food arrives to your table. This will ensure your insulin and food are  timed correctly.  Ask your server about the portion size before your order, and ask for a take-out box if the portion has more servings than you should have. When your food comes, leave the amount you should have on the plate, and put the rest in the take-out box.  Consider splitting an entree with someone and ordering a side salad. This information is not intended to replace advice given to you by your health care provider. Make sure you discuss any questions you have with your health care provider. Document Released: 12/27/2004 Document Revised: 06/04/2015 Document Reviewed: 03/26/2013 Elsevier Interactive Patient Education  2018 [employer].  Diabetes and Foot Care Diabetes may cause you to have problems because of poor blood supply (circulation) to your feet and legs. This may cause the skin on your feet to become thinner, break easier, and heal more slowly. Your skin may become dry, and the skin may peel and crack. You may also have nerve damage in your legs and feet causing decreased feeling in them. You may not notice minor injuries to your feet that could lead to infections or more serious problems. Taking care of your feet is one of the most important things you can do for yourself. Follow these instructions at home:  Wear shoes at all times, even in the house. Do not go barefoot. Bare feet are easily injured.  Check your feet daily for blisters, cuts, and redness. If you cannot see the bottom of your feet, use a mirror or ask someone for help.  Wash your feet with warm water (do not use hot water) and mild soap. Then pat your feet and the areas between your toes until they are completely dry. Do not soak your feet as this can dry your skin.  Apply a moisturizing lotion or petroleum jelly (that does not contain alcohol and is unscented) to the skin on your feet and to dry, brittle toenails. Do not apply lotion between your toes.  Trim your toenails straight across. Do not dig under  them or around the cuticle. File the edges of your nails with an emery board or nail file.  Do not cut corns or calluses or try to remove them with medicine.  Wear clean socks or stockings every day. Make sure they are not too tight. Do not wear knee-high stockings since they may decrease blood flow to your legs.  Wear shoes that fit properly and have enough cushioning. To break in new shoes, wear them for just a few hours a day. This prevents you from injuring your feet. Always look in your shoes before you put them on to be sure there are no objects inside.  Do not cross your legs. This may decrease the blood flow to your feet.  If you find a minor scrape, cut, or break in the skin on your feet, keep it and the skin around it clean and dry. These areas may be cleansed with mild soap and water. Do not cleanse the area with peroxide, alcohol, or iodine.  When you remove an adhesive bandage, be sure not to damage the skin around it.  If you have a wound, look at it several times a day to make sure it is healing.  Do not use heating pads or hot water bottles. They may burn your skin. If you have lost feeling in your feet or legs, you may not know it is happening until it is too late.  Make sure your health care provider performs a complete foot exam at least annually or more often if you have foot problems. Report any cuts, sores, or bruises to your health care provider immediately. Contact a health care provider if:  You have an injury that is not healing.  You have cuts or breaks in the skin.  You have an ingrown nail.  You notice redness on your legs or feet.  You feel burning or tingling in your legs or feet.  You have pain or cramps in your legs and feet.  Your legs or feet are numb.  Your feet always feel cold. Get help right away if:  There is increasing redness, swelling, or pain in or around a wound.  There is a red line that goes up your leg.  Pus is coming from a  wound.  You develop a fever or as directed by your health care provider.  You notice a bad smell coming from an ulcer or wound. This information is not intended to replace advice given to you by your health care provider. Make sure you discuss any questions you have with your health care provider. Document Released: 12/25/1999 Document Revised: 06/04/2015 Document Reviewed: 06/05/2012 Elsevier Interactive Patient Education  2017 [employer].

## 2016-11-22 NOTE — BH Specialist Note (Signed)
Type of Service: Clinical Social Work Consult  Trevor [name] is a 61 yo male referred by [employer] for stress. Patient was accompanied by self. Patient reports :concerns with Financial difficulties Health problems.  He has type 2 diabetes resulting in foot ulcer. Pt shared that he has been referred to a Podiatrist; however, he is unable to pay the copay of $100. Pt disclosed stress managing physical health and financial strain.   Family & Social:patient lives alone  School / Work /Fun: Pt is employed  Life changes: Pt experiencing stress from management of health and finances Issues discussed: strengthening support system and linking to community resources  Intervention: Screening Tool(s)  Administered, Supportive Counseling and Referral to [employer] ; Solution-Focused Strategies  ASSESSMENT:.Patient may benefit from psychoeducation on correlation between one's physical and mental health. Pt is experiencing increased stress from managing health and financial strain. He is not interested in initiating psychotherapy at this time. Pt is in agreement to scheduling appointment with Financial Counseling to apply for [facility] and [facility] to assist with referrals to specialists. PLAN: 1. Pt will call on Monday, November 19, 18 to schedule appointment with Financial Counseling 2. Pt was encouraged to contact [facility] if symptoms worsen or fail to improve to schedule behavioral appointments at [facility] 3. Referral: Community Resources:  Finances 4. No other services needs at this time

## 2016-11-22 NOTE — Progress Notes (Signed)
P 

## 2016-11-22 NOTE — Progress Notes (Signed)
Assessment & Plan:  Trevor [name] was seen today for establish care.  Diagnoses and all orders for this visit:  Foot ulcer due to secondary DM [facility]) -     Ambulatory referral to Podiatry  Essential hypertension -     Basic metabolic panel -     potassium chloride SA (K-DUR,KLOR-CON) 20 MEQ tablet; Take 1 tablet (20 mEq total) 2 (two) times daily by mouth.  Check blood pressure daily at the same time. Keep a log of all blood pressure readings.  DASH DIET; No salt or low sodium diet  If pressure if greater than 150/100 notify me here at the office.  If it's persistently greater 180/90, go to the Emergency Department.  Take all medication as prescribed.   Avoid smoked meats which are high in sodium content.  Avoid soda which contains sodium and are high in sugar which increases your risk for diabetes. Hyperlipidemia associated with type 2 diabetes mellitus (HCC) -     HgB A1c -     Glucose (CBG) -     Urinalysis Dipstick -     Lipid panel -     CBC -     Microalbumin / creatinine urine ratio Exercise at least 150 minutes per week.  Take all medications as prescribed. Do not skip doses.   Other orders -     glucose blood (TRUE METRIX BLOOD GLUCOSE TEST) test strip; Use as instructed     Subjective:   Chief Complaint  Patient presents with  . Establish Care    Patient is here for ulcer on right foot  Trevor [name] 61 y.o. male presents to office today for follow up and with concerns of right foot ulcer.    Checking fasting blood sugars 120 30 day average 250  Hypertension Patient here for follow-up of hypertension. He is not exercising and is adherent to low salt diet.  He does not have a blood pressure log today.  Blood pressure is well controlled at home.  Cardiac symptoms none. Patient denies chest pain, dyspnea and lower extremity edema.  Cardiovascular risk factors: advanced age (older than 46 for men, 57 for women), diabetes mellitus, dyslipidemia,  hypertension, male gender and obesity (BMI >= 30 kg/m2). Use of agents associated with hypertension: none.  History of target organ damage: none. BP Readings from Last 3 Encounters: 11/22/16 : 102/68 09/01/16 : 140/79 06/21/13 : (!) 167/97   Hyperlipidemia Patient presents for follow up to hyperlipidemia.  He  denies chest pain, dyspnea, exertional chest pressure/discomfort and lower extremity edema or statin intolerance including myalgias.  Lab Results      Component                Value               Date                      CHOL                     192                 11/22/2016           Lab Results      Component                Value               Date  HDL                      40                  11/22/2016           Lab Results      Component                Value               Date                      LDLCALC                  101 (H)             11/22/2016           Lab Results      Component                Value               Date                      TRIG                     257 (H)             11/22/2016           Lab Results      Component                Value               Date                      CHOLHDL                  4.8                 11/22/2016            Diabetes Mellitus Type II Patient here for follow-up of Type 2 diabetes mellitus.  Current symptoms/problems include hyperglycemia and have been improving.  Known diabetic complications: cardiovascular disease Eye exam current (within one year): no Weight trend: stable Current monitoring regimen: home blood tests - daily Home blood sugar records: fasting range: 140 Any episodes of hypoglycemia? no Is He on ACE inhibitor or angiotensin II receptor blocker?  Yes  Lab Results      Component                Value               Date                      HGBA1C                   10.2                11/22/2016                Foot Ulcer: Patient complains of a foot ulcer. Reports he has  very dry calloused feet. He was attempting to remove some of the calloused area from the plantar side of his foot with a pocket knife. He attempted to remove too much skin and his foot immediately began bleeding profusely.  He was seen in the ED and treated. Today his foot has healed but still has some bruising under the skin and there still remains a moderate amount of raised calloused area. The patient has had an ulcer of right plantar foot for several weeks. This has been treated in the hospital with silvadene dressing changes. Past history significant for diabetes, morbid obesity and peripheral vascular disease. The patient  denies drainage, fever, pain and redness. Patient does have a history of diabetes mellitus.    Past Medical History:  Diagnosis Date  . Diabetes mellitus without complication ([place])   . Heart murmur   . Hyperlipidemia   . Hypertension   . Sleep apnea     Past Surgical History:  Procedure Laterality Date  . CORNEAL TRANSPLANT      History reviewed. No pertinent family history.  Social History   Socioeconomic History  . Marital status: Single    Spouse name: Not on file  . Number of children: Not on file  . Years of education: Not on file  . Highest education level: Not on file  Social Needs  . Financial resource strain: Not on file  . Food insecurity - worry: Not on file  . Food insecurity - inability: Not on file  . Transportation needs - medical: Not on file  . Transportation needs - non-medical: Not on file  Occupational History  . Not on file  Tobacco Use  . Smoking status: Never Smoker  . Smokeless tobacco: Never Used  Substance and Sexual Activity  . Alcohol use: No  . Drug use: No  . Sexual activity: Not on file  Other Topics Concern  . Not on file  Social History Narrative  . Not on file    Outpatient Medications Prior to Visit  Medication Sig Dispense Refill  . atenolol (TENORMIN) 100 MG tablet Take 100 mg by mouth daily.    .  fluticasone (FLONASE) 50 MCG/ACT nasal spray Place 2 sprays into both nostrils daily.    . furosemide (LASIX) 40 MG tablet Take 40 mg by mouth as needed.    [name] glipiZIDE (GLUCOTROL) 10 MG tablet Take 10 mg by mouth 2 (two) times daily.    . insulin detemir (LEVEMIR) 100 UNIT/ML injection Inject 60 Units into the skin 2 (two) times daily.    [name] losartan (COZAAR) 25 MG tablet Take 100 mg by mouth daily.    . metFORMIN (GLUCOPHAGE) 1000 MG tablet Take 1,000 mg by mouth 2 (two) times daily.    [name] NIFEdipine (PROCARDIA XL/ADALAT-CC) 90 MG 24 hr tablet Take 90 mg by mouth daily.    . hydrochlorothiazide (HYDRODIURIL) 25 MG tablet Take 25 mg by mouth daily.    . potassium chloride SA (K-DUR,KLOR-CON) 20 MEQ tablet Take 20 mEq by mouth 2 (two) times daily.    [name] atorvastatin (LIPITOR) 10 MG tablet Take 10 mg by mouth at bedtime.    . cyclobenzaprine (FLEXERIL) 10 MG tablet Take 10 mg by mouth 3 (three) times daily as needed for muscle spasms.    . meloxicam (MOBIC) 15 MG tablet Take 15 mg by mouth daily.    . traMADol (ULTRAM) 50 MG tablet Take 50 mg by mouth every 6 (six) hours as needed.     No facility-administered medications prior to visit.     Allergies  Allergen Reactions  . Enalapril Cough    Review of Systems  Constitutional: Negative for fever, malaise/fatigue and weight loss.  HENT: Negative.  Negative for nosebleeds.  Eyes: Negative.  Negative for blurred vision, double vision and photophobia.  Respiratory: Negative.  Negative for cough and shortness of breath.   Cardiovascular: Negative.  Negative for chest pain, palpitations and leg swelling.  Gastrointestinal: Negative.  Negative for abdominal pain, constipation, diarrhea, heartburn, nausea and vomiting.  Musculoskeletal: Negative.  Negative for myalgias.  Skin:       Diabetic foot ulcer   Neurological: Negative.  Negative for dizziness, focal weakness, seizures and headaches.  Endo/Heme/Allergies: Negative for environmental  allergies.  Psychiatric/Behavioral: Negative.  Negative for suicidal ideas.       Objective:    Physical Exam  Constitutional: He is oriented to person, place, and time. He appears well-developed and well-nourished. He is cooperative.  HENT:  Head: Normocephalic and atraumatic.  Eyes: EOM are normal.  Neck: Normal range of motion.  Cardiovascular: Normal rate, regular rhythm, normal heart sounds and intact distal pulses. Exam reveals no gallop and no friction rub.  No murmur heard. Pulmonary/Chest: Effort normal and breath sounds normal. No tachypnea. No respiratory distress. He has no decreased breath sounds. He has no wheezes. He has no rhonchi. He has no rales. He exhibits no tenderness.  Abdominal: Soft. Bowel sounds are normal.  Musculoskeletal: Normal range of motion. He exhibits no edema.       Feet:  Neurological: He is alert and oriented to person, place, and time. Coordination normal.  Skin: Skin is warm and dry.  Psychiatric: He has a normal mood and affect. His behavior is normal. Judgment and thought content normal.  Nursing note and vitals reviewed.   BP 102/68 (BP Location: Right Arm, Patient Position: Sitting, Cuff Size: Normal)   Pulse 64   Temp 98.3 F (36.8 C) (Oral)   Resp 18   Ht 6\' 4"  (1.93 m)   Wt (!) 327 lb (148.3 kg)   SpO2 96%   BMI 39.80 kg/m  Wt Readings from Last 3 Encounters:  11/22/16 (!) 327 lb (148.3 kg)  09/01/16 (!) 326 lb (147.9 kg)  05/10/13 (!) 333 lb (151 kg)        Patient has been counseled extensively about nutrition and exercise as well as the importance of adherence with medications and regular follow-up. The patient was given clear instructions to go to ER or return to medical center if symptoms don't improve, worsen or new problems develop. The patient verbalized understanding.   Follow-up: Return in about 3 months (around 02/22/2017).   [name], FNP-BC [facility] and [facility] [place],  [place]

## 2016-11-23 LAB — CBC
HEMATOCRIT: 42.1 % (ref 37.5–51.0)
HEMOGLOBIN: 14 g/dL (ref 13.0–17.7)
MCH: 27 pg (ref 26.6–33.0)
MCHC: 33.3 g/dL (ref 31.5–35.7)
MCV: 81 fL (ref 79–97)
Platelets: 260 10*3/uL (ref 150–379)
RBC: 5.19 x10E6/uL (ref 4.14–5.80)
RDW: 14 % (ref 12.3–15.4)
WBC: 8 10*3/uL (ref 3.4–10.8)

## 2016-11-23 LAB — LIPID PANEL
CHOL/HDL RATIO: 4.8 ratio (ref 0.0–5.0)
CHOLESTEROL TOTAL: 192 mg/dL (ref 100–199)
HDL: 40 mg/dL (ref >39)
LDL Calculated: 101 mg/dL — ABNORMAL HIGH (ref 0–99)
TRIGLYCERIDES: 257 mg/dL — AB (ref 0–149)
VLDL Cholesterol Cal: 51 mg/dL — ABNORMAL HIGH (ref 5–40)

## 2016-11-23 LAB — MICROALBUMIN / CREATININE URINE RATIO
Creatinine, Urine: 265.1 mg/dL
MICROALBUM., U, RANDOM: 13.9 ug/mL
Microalb/Creat Ratio: 5.2 mg/g creat (ref 0.0–30.0)

## 2016-11-23 LAB — BASIC METABOLIC PANEL
BUN/Creatinine Ratio: 17 (ref 10–24)
BUN: 38 mg/dL — ABNORMAL HIGH (ref 8–27)
CO2: 28 mmol/L (ref 20–29)
Calcium: 9.7 mg/dL (ref 8.6–10.2)
Chloride: 98 mmol/L (ref 96–106)
Creatinine, Ser: 2.18 mg/dL — ABNORMAL HIGH (ref 0.76–1.27)
GFR calc Af Amer: 37 mL/min/{1.73_m2} — ABNORMAL LOW (ref >59)
GFR calc non Af Amer: 32 mL/min/{1.73_m2} — ABNORMAL LOW (ref >59)
GLUCOSE: 115 mg/dL — AB (ref 65–99)
POTASSIUM: 3 mmol/L — AB (ref 3.5–5.2)
SODIUM: 143 mmol/L (ref 134–144)

## 2016-11-23 MED ORDER — POTASSIUM CHLORIDE CRYS ER 20 MEQ PO TBCR: 20.0000 meq | EXTENDED_RELEASE_TABLET | Freq: Two times a day (BID) | ORAL | 3 refills | Status: DC

## 2016-11-24 ENCOUNTER — Telehealth: Payer: Self-pay

## 2016-11-24 NOTE — Telephone Encounter (Signed)
Patient informed on lab result. Patient is aware of stop taking HCTZ medication and new Rx was sent to his pharmacy. Patient is ware to take Cholesterol medication at night and start taking 20 mg.  Patient verified DOB.

## 2016-11-24 NOTE — Telephone Encounter (Signed)
-----   Message from [name], NP sent at 11/23/2016 11:55 PM EST ----- Your Cr+ (creatinine) and GFR which are indicators of your kidney function have worsened since 3 years ago. I will need to place a referral to nephrology. At this time I would like for you to STOP taking the HCTZ.  Also your potassium is low. I will send in a prescription for you to take for the next few weeks. Please make sure you are drinking plenty of water and avoiding excessive salt in your diet as well as avoiding medications like ibuprofen, advil, aleve, naproxen or any NSAIDs as they can also affect your kidney function. Please call patient: Tests show increased cholesterol/lipid levels. Will need to increase your statin or cholesterol/lipid lowering medication to 20mg  instead of 10. Please make sure you take your cholesterol medicine at night.  Prescription has been sent to the pharmacy. Patient should continue to work on low fat, heart healthy diet and participate in regular aerobic exercise program to control as well. Please come into the office in 2 weeks to have your labs drawn again to make sure your potassium has increased and your renal function has improved.

## 2016-11-26 ENCOUNTER — Encounter: Payer: Self-pay | Admitting: Nurse Practitioner

## 2016-11-29 ENCOUNTER — Other Ambulatory Visit: Payer: Self-pay | Admitting: Nurse Practitioner

## 2016-11-29 MED ORDER — TRUEPLUS LANCETS 28G MISC: 3 refills | Status: DC

## 2016-11-29 MED ORDER — TRUE METRIX METER W/DEVICE KIT: PACK | 0 refills | Status: DC

## 2016-11-29 MED FILL — !TRUE METRIX BLOOD GLUCOSE: 1 days supply | Qty: 1 | Fill #0

## 2016-11-29 MED FILL — TRUEplus LANCETS 28G MISC: 30 days supply | Qty: 100 | Fill #0

## 2016-12-12 ENCOUNTER — Ambulatory Visit: Payer: Self-pay | Attending: Nurse Practitioner

## 2017-01-25 ENCOUNTER — Telehealth: Payer: Self-pay | Admitting: Pharmacist

## 2017-01-25 NOTE — Telephone Encounter (Signed)
01/25/17 Faxed Novo Nordisk a refill request for Levemir Vial-Inject 60 units twice daily # 15 (Max daily dose 120 units).[name]

## 2017-03-29 ENCOUNTER — Emergency Department (HOSPITAL_COMMUNITY): Payer: Medicare Other

## 2017-03-29 ENCOUNTER — Other Ambulatory Visit: Payer: Self-pay

## 2017-03-29 ENCOUNTER — Ambulatory Visit (HOSPITAL_BASED_OUTPATIENT_CLINIC_OR_DEPARTMENT_OTHER): Payer: Medicare Other | Admitting: Physician Assistant

## 2017-03-29 ENCOUNTER — Inpatient Hospital Stay (HOSPITAL_COMMUNITY)
Admission: EM | Admit: 2017-03-29 | Discharge: 2017-04-04 | DRG: 853 | Disposition: A | Discharge status: Rehabilitation Facility | Payer: Medicare Other | Attending: Internal Medicine | Admitting: Internal Medicine

## 2017-03-29 ENCOUNTER — Encounter (HOSPITAL_COMMUNITY): Payer: Self-pay | Admitting: Emergency Medicine

## 2017-03-29 VITALS — BP 110/72 | HR 79 | Temp 98.4°F | Resp 16

## 2017-03-29 DIAGNOSIS — E1142 Type 2 diabetes mellitus with diabetic polyneuropathy: Secondary | ICD-10-CM | POA: Diagnosis not present

## 2017-03-29 DIAGNOSIS — E1165 Type 2 diabetes mellitus with hyperglycemia: Secondary | ICD-10-CM | POA: Insufficient documentation

## 2017-03-29 DIAGNOSIS — E0810 Diabetes mellitus due to underlying condition with ketoacidosis without coma: Secondary | ICD-10-CM

## 2017-03-29 DIAGNOSIS — Z888 Allergy status to other drugs, medicaments and biological substances status: Secondary | ICD-10-CM | POA: Insufficient documentation

## 2017-03-29 DIAGNOSIS — E1169 Type 2 diabetes mellitus with other specified complication: Secondary | ICD-10-CM | POA: Diagnosis not present

## 2017-03-29 DIAGNOSIS — N179 Acute kidney failure, unspecified: Secondary | ICD-10-CM | POA: Diagnosis present

## 2017-03-29 DIAGNOSIS — Z89511 Acquired absence of right leg below knee: Secondary | ICD-10-CM | POA: Diagnosis not present

## 2017-03-29 DIAGNOSIS — Z79899 Other long term (current) drug therapy: Secondary | ICD-10-CM

## 2017-03-29 DIAGNOSIS — A408 Other streptococcal sepsis: Principal | ICD-10-CM | POA: Diagnosis present

## 2017-03-29 DIAGNOSIS — E11621 Type 2 diabetes mellitus with foot ulcer: Secondary | ICD-10-CM | POA: Diagnosis present

## 2017-03-29 DIAGNOSIS — R7881 Bacteremia: Secondary | ICD-10-CM | POA: Diagnosis not present

## 2017-03-29 DIAGNOSIS — I878 Other specified disorders of veins: Secondary | ICD-10-CM

## 2017-03-29 DIAGNOSIS — E08621 Diabetes mellitus due to underlying condition with foot ulcer: Secondary | ICD-10-CM | POA: Diagnosis not present

## 2017-03-29 DIAGNOSIS — G8918 Other acute postprocedural pain: Secondary | ICD-10-CM | POA: Diagnosis not present

## 2017-03-29 DIAGNOSIS — K5900 Constipation, unspecified: Secondary | ICD-10-CM | POA: Diagnosis not present

## 2017-03-29 DIAGNOSIS — E1152 Type 2 diabetes mellitus with diabetic peripheral angiopathy with gangrene: Secondary | ICD-10-CM | POA: Diagnosis not present

## 2017-03-29 DIAGNOSIS — I10 Essential (primary) hypertension: Secondary | ICD-10-CM

## 2017-03-29 DIAGNOSIS — G4730 Sleep apnea, unspecified: Secondary | ICD-10-CM

## 2017-03-29 DIAGNOSIS — Z794 Long term (current) use of insulin: Secondary | ICD-10-CM

## 2017-03-29 DIAGNOSIS — I129 Hypertensive chronic kidney disease with stage 1 through stage 4 chronic kidney disease, or unspecified chronic kidney disease: Secondary | ICD-10-CM | POA: Diagnosis not present

## 2017-03-29 DIAGNOSIS — N183 Chronic kidney disease, stage 3 unspecified: Secondary | ICD-10-CM | POA: Diagnosis present

## 2017-03-29 DIAGNOSIS — G9349 Other encephalopathy: Secondary | ICD-10-CM | POA: Diagnosis not present

## 2017-03-29 DIAGNOSIS — E785 Hyperlipidemia, unspecified: Secondary | ICD-10-CM | POA: Diagnosis present

## 2017-03-29 DIAGNOSIS — Z978 Presence of other specified devices: Secondary | ICD-10-CM | POA: Diagnosis not present

## 2017-03-29 DIAGNOSIS — I96 Gangrene, not elsewhere classified: Secondary | ICD-10-CM | POA: Diagnosis not present

## 2017-03-29 DIAGNOSIS — R143 Flatulence: Secondary | ICD-10-CM | POA: Diagnosis not present

## 2017-03-29 DIAGNOSIS — Z6836 Body mass index (BMI) 36.0-36.9, adult: Secondary | ICD-10-CM

## 2017-03-29 DIAGNOSIS — Z23 Encounter for immunization: Secondary | ICD-10-CM

## 2017-03-29 DIAGNOSIS — Z89611 Acquired absence of right leg above knee: Secondary | ICD-10-CM | POA: Diagnosis not present

## 2017-03-29 DIAGNOSIS — E1122 Type 2 diabetes mellitus with diabetic chronic kidney disease: Secondary | ICD-10-CM | POA: Diagnosis present

## 2017-03-29 DIAGNOSIS — Z9119 Patient's noncompliance with other medical treatment and regimen: Secondary | ICD-10-CM

## 2017-03-29 DIAGNOSIS — D62 Acute posthemorrhagic anemia: Secondary | ICD-10-CM | POA: Diagnosis not present

## 2017-03-29 DIAGNOSIS — B955 Unspecified streptococcus as the cause of diseases classified elsewhere: Secondary | ICD-10-CM | POA: Diagnosis present

## 2017-03-29 DIAGNOSIS — R000 Tachycardia, unspecified: Secondary | ICD-10-CM

## 2017-03-29 DIAGNOSIS — A419 Sepsis, unspecified organism: Secondary | ICD-10-CM | POA: Diagnosis not present

## 2017-03-29 DIAGNOSIS — E669 Obesity, unspecified: Secondary | ICD-10-CM | POA: Diagnosis present

## 2017-03-29 DIAGNOSIS — M86271 Subacute osteomyelitis, right ankle and foot: Secondary | ICD-10-CM

## 2017-03-29 DIAGNOSIS — L03115 Cellulitis of right lower limb: Secondary | ICD-10-CM | POA: Diagnosis present

## 2017-03-29 DIAGNOSIS — G9340 Encephalopathy, unspecified: Secondary | ICD-10-CM | POA: Diagnosis not present

## 2017-03-29 DIAGNOSIS — L02611 Cutaneous abscess of right foot: Secondary | ICD-10-CM | POA: Diagnosis present

## 2017-03-29 DIAGNOSIS — S88111S Complete traumatic amputation at level between knee and ankle, right lower leg, sequela: Secondary | ICD-10-CM | POA: Diagnosis not present

## 2017-03-29 DIAGNOSIS — M869 Osteomyelitis, unspecified: Secondary | ICD-10-CM | POA: Diagnosis present

## 2017-03-29 DIAGNOSIS — Z1339 Encounter for screening examination for other mental health and behavioral disorders: Secondary | ICD-10-CM | POA: Diagnosis not present

## 2017-03-29 DIAGNOSIS — E876 Hypokalemia: Secondary | ICD-10-CM | POA: Diagnosis present

## 2017-03-29 DIAGNOSIS — H548 Legal blindness, as defined in USA: Secondary | ICD-10-CM | POA: Diagnosis not present

## 2017-03-29 DIAGNOSIS — E46 Unspecified protein-calorie malnutrition: Secondary | ICD-10-CM | POA: Diagnosis not present

## 2017-03-29 DIAGNOSIS — K922 Gastrointestinal hemorrhage, unspecified: Secondary | ICD-10-CM | POA: Diagnosis not present

## 2017-03-29 DIAGNOSIS — R509 Fever, unspecified: Secondary | ICD-10-CM | POA: Diagnosis not present

## 2017-03-29 DIAGNOSIS — L97509 Non-pressure chronic ulcer of other part of unspecified foot with unspecified severity: Secondary | ICD-10-CM | POA: Diagnosis not present

## 2017-03-29 DIAGNOSIS — L97519 Non-pressure chronic ulcer of other part of right foot with unspecified severity: Secondary | ICD-10-CM

## 2017-03-29 DIAGNOSIS — B954 Other streptococcus as the cause of diseases classified elsewhere: Secondary | ICD-10-CM | POA: Diagnosis not present

## 2017-03-29 DIAGNOSIS — E1110 Type 2 diabetes mellitus with ketoacidosis without coma: Secondary | ICD-10-CM | POA: Diagnosis present

## 2017-03-29 DIAGNOSIS — Z4781 Encounter for orthopedic aftercare following surgical amputation: Secondary | ICD-10-CM | POA: Diagnosis not present

## 2017-03-29 DIAGNOSIS — R7309 Other abnormal glucose: Secondary | ICD-10-CM | POA: Diagnosis not present

## 2017-03-29 DIAGNOSIS — R011 Cardiac murmur, unspecified: Secondary | ICD-10-CM | POA: Diagnosis not present

## 2017-03-29 DIAGNOSIS — M19071 Primary osteoarthritis, right ankle and foot: Secondary | ICD-10-CM | POA: Diagnosis not present

## 2017-03-29 LAB — URINALYSIS, ROUTINE W REFLEX MICROSCOPIC
BILIRUBIN URINE: NEGATIVE
Ketones, ur: NEGATIVE mg/dL
LEUKOCYTES UA: NEGATIVE
Nitrite: NEGATIVE
PH: 5 (ref 5.0–8.0)
Protein, ur: NEGATIVE mg/dL
SPECIFIC GRAVITY, URINE: 1.009 (ref 1.005–1.030)

## 2017-03-29 LAB — I-STAT CG4 LACTIC ACID, ED
Lactic Acid, Venous: 3.21 mmol/L (ref 0.5–1.9)
Lactic Acid, Venous: 4.67 mmol/L (ref 0.5–1.9)

## 2017-03-29 LAB — BASIC METABOLIC PANEL
ANION GAP: 17 — AB (ref 5–15)
BUN: 53 mg/dL — ABNORMAL HIGH (ref 6–20)
CALCIUM: 8.9 mg/dL (ref 8.9–10.3)
CO2: 22 mmol/L (ref 22–32)
CREATININE: 2.8 mg/dL — AB (ref 0.61–1.24)
Chloride: 85 mmol/L — ABNORMAL LOW (ref 101–111)
GFR calc non Af Amer: 23 mL/min — ABNORMAL LOW (ref >60)
GFR, EST AFRICAN AMERICAN: 27 mL/min — AB (ref >60)
Glucose, Bld: 542 mg/dL (ref 65–99)
Potassium: 3.4 mmol/L — ABNORMAL LOW (ref 3.5–5.1)
SODIUM: 124 mmol/L — AB (ref 135–145)

## 2017-03-29 LAB — CBG MONITORING, ED: Glucose-Capillary: 362 mg/dL — ABNORMAL HIGH (ref 65–99)

## 2017-03-29 LAB — CBC
HCT: 37.8 % — ABNORMAL LOW (ref 39.0–52.0)
HEMOGLOBIN: 13.3 g/dL (ref 13.0–17.0)
MCH: 27.4 pg (ref 26.0–34.0)
MCHC: 35.2 g/dL (ref 30.0–36.0)
MCV: 77.9 fL — ABNORMAL LOW (ref 78.0–100.0)
PLATELETS: 533 10*3/uL — AB (ref 150–400)
RBC: 4.85 MIL/uL (ref 4.22–5.81)
RDW: 12.9 % (ref 11.5–15.5)
WBC: 23.8 10*3/uL — ABNORMAL HIGH (ref 4.0–10.5)

## 2017-03-29 LAB — POCT GLYCOSYLATED HEMOGLOBIN (HGB A1C): Hemoglobin A1C: 12.5

## 2017-03-29 LAB — GLUCOSE, POCT (MANUAL RESULT ENTRY): POC Glucose: 516 mg/dl — AB (ref 70–99)

## 2017-03-29 MED ORDER — INSULIN ASPART 100 UNIT/ML ~~LOC~~ SOLN
10.0000 [IU] | Freq: Once | SUBCUTANEOUS | Status: AC
  Administered 2017-03-29: 10 [IU] via SUBCUTANEOUS
  Filled 2017-03-29: qty 1

## 2017-03-29 MED ORDER — SODIUM CHLORIDE 0.9 % IV BOLUS (SEPSIS)
1000.0000 mL | Freq: Once | INTRAVENOUS | Status: AC
  Administered 2017-03-29: 1000 mL via INTRAVENOUS

## 2017-03-29 MED ORDER — SODIUM CHLORIDE 0.45 % IV SOLN: INTRAVENOUS | Status: DC

## 2017-03-29 MED ORDER — SENNOSIDES-DOCUSATE SODIUM 8.6-50 MG PO TABS: 1.0000 | ORAL_TABLET | Freq: Every evening | ORAL | Status: DC | PRN

## 2017-03-29 MED ORDER — LIDOCAINE-EPINEPHRINE (PF) 2 %-1:200000 IJ SOLN
10.0000 mL | Freq: Once | INTRAMUSCULAR | Status: AC
  Administered 2017-03-29: 10 mL via INTRADERMAL
  Filled 2017-03-29: qty 20

## 2017-03-29 MED ORDER — VANCOMYCIN HCL 10 G IV SOLR
2000.0000 mg | Freq: Once | INTRAVENOUS | Status: AC
  Administered 2017-03-29: 2000 mg via INTRAVENOUS
  Filled 2017-03-29: qty 2000

## 2017-03-29 MED ORDER — LACTATED RINGERS IV SOLN
INTRAVENOUS | Status: AC
  Administered 2017-03-29: 23:00:00 via INTRAVENOUS

## 2017-03-29 MED ORDER — VANCOMYCIN HCL 10 G IV SOLR: 2000.0000 mg | INTRAVENOUS | Status: DC

## 2017-03-29 MED ORDER — VANCOMYCIN HCL IN DEXTROSE 1-5 GM/200ML-% IV SOLN
1000.0000 mg | Freq: Once | INTRAVENOUS | Status: DC
  Filled 2017-03-29: qty 200

## 2017-03-29 MED ORDER — INSULIN ASPART 100 UNIT/ML ~~LOC~~ SOLN: 4.0000 [IU] | Freq: Three times a day (TID) | SUBCUTANEOUS | Status: DC

## 2017-03-29 MED ORDER — INSULIN GLARGINE 100 UNIT/ML ~~LOC~~ SOLN
45.0000 [IU] | Freq: Two times a day (BID) | SUBCUTANEOUS | Status: DC
  Filled 2017-03-29 (×3): qty 0.45

## 2017-03-29 MED ORDER — POTASSIUM CHLORIDE 10 MEQ/100ML IV SOLN
10.0000 meq | INTRAVENOUS | Status: AC
  Administered 2017-03-29 – 2017-03-30 (×4): 10 meq via INTRAVENOUS
  Filled 2017-03-29 (×5): qty 100

## 2017-03-29 MED ORDER — INSULIN ASPART 100 UNIT/ML ~~LOC~~ SOLN
20.0000 [IU] | Freq: Once | SUBCUTANEOUS | Status: AC
Start: 2017-03-29 — End: 2017-03-29
  Administered 2017-03-29: 20 [IU] via SUBCUTANEOUS

## 2017-03-29 MED ORDER — PIPERACILLIN-TAZOBACTAM 3.375 G IVPB
3.3750 g | Freq: Three times a day (TID) | INTRAVENOUS | Status: DC
  Administered 2017-03-30: 3.375 g via INTRAVENOUS
  Filled 2017-03-29 (×3): qty 50

## 2017-03-29 MED ORDER — TETANUS-DIPHTH-ACELL PERTUSSIS 5-2.5-18.5 LF-MCG/0.5 IM SUSP
0.5000 mL | Freq: Once | INTRAMUSCULAR | Status: AC
  Administered 2017-03-29: 0.5 mL via INTRAMUSCULAR
  Filled 2017-03-29: qty 0.5

## 2017-03-29 MED ORDER — DEXTROSE-NACL 5-0.45 % IV SOLN: INTRAVENOUS | Status: DC

## 2017-03-29 MED ORDER — SENNOSIDES-DOCUSATE SODIUM 8.6-50 MG PO TABS
1.0000 | ORAL_TABLET | Freq: Every day | ORAL | Status: DC
  Administered 2017-03-29 – 2017-04-01 (×4): 1 via ORAL
  Filled 2017-03-29 (×5): qty 1

## 2017-03-29 MED ORDER — SODIUM CHLORIDE 0.9 % IV SOLN
INTRAVENOUS | Status: DC
  Administered 2017-03-29: 3 [IU]/h via INTRAVENOUS
  Filled 2017-03-29: qty 1

## 2017-03-29 MED ORDER — HYDROMORPHONE HCL 1 MG/ML IJ SOLN
1.0000 mg | INTRAMUSCULAR | Status: DC | PRN
Start: 2017-03-29 — End: 2017-04-04
  Administered 2017-03-30 – 2017-04-03 (×11): 1 mg via INTRAVENOUS
  Filled 2017-03-29 (×13): qty 1

## 2017-03-29 MED ORDER — SODIUM CHLORIDE 0.9 % IV SOLN
INTRAVENOUS | Status: DC
  Filled 2017-03-29: qty 1

## 2017-03-29 MED ORDER — ACETAMINOPHEN 325 MG PO TABS
650.0000 mg | ORAL_TABLET | Freq: Four times a day (QID) | ORAL | Status: DC | PRN
  Administered 2017-03-30 – 2017-04-03 (×5): 650 mg via ORAL
  Filled 2017-03-29 (×6): qty 2

## 2017-03-29 MED ORDER — INSULIN ASPART 100 UNIT/ML ~~LOC~~ SOLN
0.0000 [IU] | SUBCUTANEOUS | Status: AC
  Administered 2017-03-30: 11 [IU] via SUBCUTANEOUS

## 2017-03-29 MED ORDER — PIPERACILLIN-TAZOBACTAM 3.375 G IVPB 30 MIN
3.3750 g | Freq: Once | INTRAVENOUS | Status: AC
  Administered 2017-03-29: 3.375 g via INTRAVENOUS
  Filled 2017-03-29: qty 50

## 2017-03-29 MED ORDER — SODIUM CHLORIDE 0.9 % IV BOLUS (SEPSIS)
500.0000 mL | Freq: Once | INTRAVENOUS | Status: AC
  Administered 2017-03-29: 500 mL via INTRAVENOUS

## 2017-03-29 MED ORDER — ONDANSETRON HCL 4 MG/2ML IJ SOLN
4.0000 mg | Freq: Once | INTRAMUSCULAR | Status: AC
  Administered 2017-03-29: 4 mg via INTRAVENOUS
  Filled 2017-03-29: qty 2

## 2017-03-29 MED ORDER — INSULIN ASPART 100 UNIT/ML ~~LOC~~ SOLN: 0.0000 [IU] | Freq: Three times a day (TID) | SUBCUTANEOUS | Status: DC

## 2017-03-29 MED ORDER — ACETAMINOPHEN 650 MG RE SUPP: 650.0000 mg | Freq: Four times a day (QID) | RECTAL | Status: DC | PRN

## 2017-03-29 MED ORDER — MORPHINE SULFATE (PF) 4 MG/ML IV SOLN
6.0000 mg | Freq: Once | INTRAVENOUS | Status: AC
  Administered 2017-03-29: 6 mg via INTRAVENOUS
  Filled 2017-03-29: qty 2

## 2017-03-29 MED ORDER — POLYETHYLENE GLYCOL 3350 17 G PO PACK
17.0000 g | PACK | Freq: Every day | ORAL | Status: DC
  Administered 2017-04-02: 17 g via ORAL
  Filled 2017-03-29 (×3): qty 1

## 2017-03-29 MED ORDER — HEPARIN SODIUM (PORCINE) 5000 UNIT/ML IJ SOLN: 5000.0000 [IU] | Freq: Three times a day (TID) | INTRAMUSCULAR | Status: DC

## 2017-03-29 NOTE — ED Notes (Signed)
Admitting at the bedside.  

## 2017-03-29 NOTE — ED Notes (Signed)
Dr Tomi Bamberger and Gertie Fey, PA at bedside

## 2017-03-29 NOTE — ED Notes (Addendum)
Doppler, suture cart, and lidocaine at bedside

## 2017-03-29 NOTE — ED Notes (Signed)
Phlebotomy at bedside.

## 2017-03-29 NOTE — ED Notes (Signed)
Unsuccessful attempt to start second IV x 2

## 2017-03-29 NOTE — ED Notes (Signed)
Sepsis @ 1954 (RN aware)

## 2017-03-29 NOTE — ED Provider Notes (Signed)
Pt presents with complains of foot pain.  History of diabetes and poorly controlled blood sugar.    Exam shows a very malodorous wound on the foot.  Obvious fluctuant tissue.  Sx concerning for deep space infection in the foot.  No signs of severe sepsis with normal BP but lactic acid elevated.  Will start abx.  Likely will need surgical debridement.  Medical screening examination/treatment/procedure(s) were conducted as a shared visit with non-physician practitioner(s) and myself.  I personally evaluated the patient during the encounter.     Dorie Rank, MD 03/29/17 [id number]

## 2017-03-29 NOTE — ED Notes (Signed)
Pt given urinal.

## 2017-03-29 NOTE — Progress Notes (Signed)
Pharmacy Antibiotic Note  Trevor [name] is a 62 y.o. male admitted on 03/29/2017 with sepsis and cellulitis.  Pharmacy has been consulted for Vancomycin and Zosyn dosing. WBC 23.8, LA 3.21. SCr 2.80, normalized CrCl ~28.5 mL/min.   Plan: Zosyn 3.375g IV x1 over 14min, then 3.375g IV q8h-4hr infusion. Vancomycin 2g IV every 48 hours.  Follow-up renal function, culture results, and clinical status.   Height: 6\' 4"  (193 cm) Weight: (!) 330 lb (149.7 kg) IBW/kg (Calculated) : 86.8  Temp (24hrs), Avg:98.2 F (36.8 C), Min:98 F (36.7 C), Max:98.4 F (36.9 C)  Recent Labs  Lab 03/29/17 1638 03/29/17 1707  WBC 23.8*  --   CREATININE 2.80*  --   LATICACIDVEN  --  3.21*    Estimated Creatinine Clearance: 44.4 mL/min (A) (by C-G formula based on SCr of 2.8 mg/dL (H)).    Allergies  Allergen Reactions  . Enalapril Cough    Antimicrobials this admission: Vancomycin 3/20 >> Zosyn 3/20 >>  Dose adjustments this admission:   Microbiology results: 3/20 Blood >> 3/20 Urine >>  Thank you for allowing pharmacy to be a part of this patient's care.  Sloan Leiter, PharmD, BCPS, BCCCP Clinical Pharmacist Clinical phone 03/29/2017 until 11PM [phone] After hours, please call #[phone] 03/29/2017 7:57 PM

## 2017-03-29 NOTE — ED Provider Notes (Signed)
[facility] EMERGENCY DEPARTMENT Provider Note   CSN: [id number] Arrival date & time: 03/29/17  1604     History   Chief Complaint Chief Complaint  Patient presents with  . Hyperglycemia  . Foot Ulcer    HPI Trevor [name] is a 62 y.o. male.  HPI   62 year old male with history of insulin-dependent diabetes, hypertension, hyperlipidemia presenting for evaluation of right foot infection.  Patient report last year around November, he was cutting a callus of his right foot when it started to bleed.  Initially it was healing but within the past month, he noticed increasing pain swelling redness and discharge coming from his right foot with associated foul odor.  Pain is currently 9 out of 10, throbbing and achy and have been persistent.  Denies numbness.  Endorsed chills, and generalized fatigue.  Also endorsed decrease in appetite.  Patient mentioned not taking his diabetic medication for the past 3 weeks due to persistent foot pain, not due to lack of medication access.  He was brought here by his nephew for further evaluation.  He has not been evaluated for this wound before.  He is not up-to-date with tetanus.  He admits that his blood sugar have not been well controlled due to noncompliance.  He does check his sugar on an inconsistent basis and is usually in the 200s.  He denies any specific treatment tried at home.  Past Medical History:  Diagnosis Date  . Diabetes mellitus without complication ([place])   . Heart murmur   . Hyperlipidemia   . Hypertension   . Sleep apnea     Patient Active Problem List   Diagnosis Date Noted  . Heart murmur 11/22/2016  . Hyperlipidemia associated with type 2 diabetes mellitus ([place]) 11/22/2016  . Obstructive sleep apnea syndrome 11/22/2016  . Essential hypertension 12/17/2012    Past Surgical History:  Procedure Laterality Date  . CORNEAL TRANSPLANT         Home Medications    Prior to Admission medications     Medication Sig Start Date End Date Taking? Authorizing Provider  atenolol (TENORMIN) 100 MG tablet Take 100 mg by mouth daily.    [provider]  atorvastatin (LIPITOR) 10 MG tablet Take 10 mg by mouth at bedtime.    [provider]  Blood Glucose Monitoring Suppl (TRUE METRIX METER) w/Device KIT Use as instructed 11/29/16   [name], NP  cyclobenzaprine (FLEXERIL) 10 MG tablet Take 10 mg by mouth 3 (three) times daily as needed for muscle spasms.    [provider]  fluticasone (FLONASE) 50 MCG/ACT nasal spray Place 2 sprays into both nostrils daily.    [provider]  furosemide (LASIX) 40 MG tablet Take 40 mg by mouth as needed.    [provider]  glipiZIDE (GLUCOTROL) 10 MG tablet Take 10 mg by mouth 2 (two) times daily.    [provider]  glucose blood (TRUE METRIX BLOOD GLUCOSE TEST) test strip Use as instructed 11/22/16   [name], NP  insulin detemir (LEVEMIR) 100 UNIT/ML injection Inject 60 Units into the skin 2 (two) times daily.    [provider]  losartan (COZAAR) 25 MG tablet Take 100 mg by mouth daily.    [provider]  meloxicam (MOBIC) 15 MG tablet Take 15 mg by mouth daily. 03/25/15   [provider]  metFORMIN (GLUCOPHAGE) 1000 MG tablet Take 1,000 mg by mouth 2 (two) times daily.  [provider]  NIFEdipine (PROCARDIA XL/ADALAT-CC) 90 MG 24 hr tablet Take 90 mg by mouth daily. 04/24/13   [provider]  potassium chloride SA (K-DUR,KLOR-CON) 20 MEQ tablet Take 1 tablet (20 mEq total) 2 (two) times daily by mouth. 11/23/16 12/23/16  [name], NP  traMADol (ULTRAM) 50 MG tablet Take 50 mg by mouth every 6 (six) hours as needed. 01/16/15   [provider]  TRUEPLUS LANCETS 28G MISC Use as instructed 11/29/16   [name], NP    Family History No family history on file.  Social History Social History   Tobacco Use  . Smoking  status: Never Smoker  . Smokeless tobacco: Never Used  Substance Use Topics  . Alcohol use: No  . Drug use: No     Allergies   Enalapril   Review of Systems Review of Systems  All other systems reviewed and are negative.    Physical Exam Updated Vital Signs BP 139/69 (BP Location: Right Arm)   Pulse 81   Temp 98 F (36.7 C) (Oral)   Resp 20   Ht 6' 4"  (1.93 m)   Wt (!) 149.7 kg (330 lb)   SpO2 98%   BMI 40.17 kg/m   Physical Exam  Constitutional: He appears well-developed and well-nourished. No distress.  Ill-appearing male laying in bed  HENT:  Head: Atraumatic.  Eyes: Conjunctivae are normal.  Neck: Neck supple.  Cardiovascular: Normal rate and regular rhythm. Exam reveals no gallop and no friction rub.  Murmur (3 out of 6 systolic heart murmur) heard. Pulmonary/Chest: Effort normal and breath sounds normal.  Abdominal: Soft. He exhibits no distension. There is no tenderness.  Musculoskeletal: He exhibits tenderness (Right foot: Gangrene appearing skin changes noted to the first MTP as evidenced by necrotic skin that is indurated, fluctuated with foul odor and surrounding 2+ pitting edema extending towards the mid calf.  Pedal pulses difficult to palpate).  Neurological: He is alert.  Skin: No rash noted.  Psychiatric: He has a normal mood and affect.  Nursing note and vitals reviewed.          ED Treatments / Results  Labs (all labs ordered are listed, but only abnormal results are displayed) Labs Reviewed  BASIC METABOLIC PANEL - Abnormal; Notable for the following components:      Result Value   Sodium 124 (*)    Potassium 3.4 (*)    Chloride 85 (*)    Glucose, Bld 542 (*)    BUN 53 (*)    Creatinine, Ser 2.80 (*)    GFR calc non Af Amer 23 (*)    GFR calc Af Amer 27 (*)    Anion gap 17 (*)    All other components within normal limits  CBC - Abnormal; Notable for the following components:   WBC 23.8 (*)    HCT 37.8 (*)    MCV 77.9 (*)     Platelets 533 (*)    All other components within normal limits  CBG MONITORING, ED - Abnormal; Notable for the following components:   Glucose-Capillary 362 (*)    All other components within normal limits  I-STAT CG4 LACTIC ACID, ED - Abnormal; Notable for the following components:   Lactic Acid, Venous 3.21 (*)    All other components within normal limits  I-STAT CG4 LACTIC ACID, ED - Abnormal; Notable for the following components:   Lactic Acid, Venous 4.67 (*)    All other components within normal  limits  CULTURE, BLOOD (ROUTINE X 2)  CULTURE, BLOOD (ROUTINE X 2)  URINE CULTURE  URINALYSIS, ROUTINE W REFLEX MICROSCOPIC    EKG  EKG Interpretation None       Radiology Dg Chest Port 1 View  Result Date: 03/29/2017 CLINICAL DATA:  Fever EXAM: PORTABLE CHEST 1 VIEW COMPARISON:  None. FINDINGS: Mildly low lung volumes with atelectasis at the left base. No focal consolidation. Heart size upper normal. No pneumothorax. IMPRESSION: Low lung volumes with minimal atelectasis at the left base. Electronically Signed   By: Donavan Foil M.D.   On: 03/29/2017 20:58   Dg Foot Complete Right  Result Date: 03/29/2017 CLINICAL DATA:  Patient with a wound on the plantar surface of the right foot for 3 weeks. Pain and swelling. EXAM: RIGHT FOOT COMPLETE - 3+ VIEW COMPARISON:  None. FINDINGS: Soft tissue wound is seen adjacent to the head of the first metatarsal. Soft tissues are swollen and there is gas extending from the IP joint of the great toe to medial aspect of the talar neck. There is bony destructive change in the metatarsal head medially consistent with osteomyelitis. Bony destructive change is also seen in the base of the proximal phalanx of the great toe. No fracture is identified. IMPRESSION: Findings consistent with osteomyelitis in the head of the first metatarsal and base of the proximal phalanx of the great toe. Marked soft tissue swelling with extensive soft tissue gas extending  from the IP joint of the great toe to the neck of the talus. Electronically Signed   By: Inge Rise M.D.   On: 03/29/2017 18:24    Procedures .Marland KitchenIncision and Drainage Date/Time: 03/29/2017 9:15 PM Performed by: Domenic Moras, PA-C Authorized by: Domenic Moras, PA-C   Consent:    Consent obtained:  Verbal   Consent given by:  Patient   Risks discussed:  Incomplete drainage   Alternatives discussed:  No treatment Location:    Type:  Abscess   Size:  4cm   Location:  Lower extremity   Lower extremity location:  Foot   Foot location:  R foot Pre-procedure details:    Skin preparation:  Betadine Anesthesia (see MAR for exact dosages):    Anesthesia method:  Local infiltration   Local anesthetic:  Lidocaine 2% WITH epi Procedure type:    Complexity:  Complex Procedure details:    Incision types:  Stab incision   Incision depth:  Subcutaneous   Scalpel blade:  11   Wound management:  Probed and deloculated   Drainage:  Purulent   Drainage amount:  Moderate   Wound treatment:  Wound left open   Packing materials:  None Post-procedure details:    Patient tolerance of procedure:  Tolerated well, no immediate complications   (including critical care time)  Medications Ordered in ED Medications  sodium chloride 0.9 % bolus 1,000 mL (0 mLs Intravenous Stopped 03/29/17 2150)    And  sodium chloride 0.9 % bolus 1,000 mL (0 mLs Intravenous Stopped 03/29/17 2033)    And  sodium chloride 0.9 % bolus 1,000 mL (1,000 mLs Intravenous New Bag/Given 03/29/17 2034)    And  sodium chloride 0.9 % bolus 1,000 mL (1,000 mLs Intravenous New Bag/Given 03/29/17 2151)    And  sodium chloride 0.9 % bolus 500 mL (not administered)  dextrose 5 %-0.45 % sodium chloride infusion (not administered)  insulin regular (NOVOLIN R,HUMULIN R) 100 Units in sodium chloride 0.9 % 100 mL (1 Units/mL) infusion (not administered)  vancomycin (VANCOCIN)  2,000 mg in sodium chloride 0.9 % 500 mL IVPB (2,000 mg  Intravenous New Bag/Given 03/29/17 2012)    Followed by  vancomycin (VANCOCIN) 2,000 mg in sodium chloride 0.9 % 500 mL IVPB (not administered)  piperacillin-tazobactam (ZOSYN) IVPB 3.375 g (not administered)  piperacillin-tazobactam (ZOSYN) IVPB 3.375 g (0 g Intravenous Stopped 03/29/17 2042)  Tdap (BOOSTRIX) injection 0.5 mL (0.5 mLs Intramuscular Given 03/29/17 2028)  lidocaine-EPINEPHrine (XYLOCAINE W/EPI) 2 %-1:200000 (PF) injection 10 mL (10 mLs Intradermal Given by Other 03/29/17 2028)  morphine 4 MG/ML injection 6 mg (6 mg Intravenous Given 03/29/17 2028)  ondansetron (ZOFRAN) injection 4 mg (4 mg Intravenous Given 03/29/17 2028)     Initial Impression / Assessment and Plan / ED Course  I have reviewed the triage vital signs and the nursing notes.  Pertinent labs & imaging results that were available during my care of the patient were reviewed by me and considered in my medical decision making (see chart for details).  Clinical Course as of Mar 29 2112  Wed Mar 29, 2017  2102 Repeat lactic is 4.67.  BP remains stable.  Abx and fluids have been ordered  [JK]    Clinical Course User Index [JK] Dorie Rank, MD    BP 123/67   Pulse 78   Temp 98 F (36.7 C) (Oral)   Resp 14   Ht 6' 4"  (1.93 m)   Wt (!) 149.7 kg (330 lb)   SpO2 97%   BMI 40.17 kg/m    Final Clinical Impressions(s) / ED Diagnoses   Final diagnoses:  Osteomyelitis of right foot, unspecified type ([place])  Type 2 diabetes mellitus with right diabetic foot ulcer ([place])  Diabetic ketoacidosis without coma associated with diabetes mellitus due to underlying condition ([place])  Sepsis affecting skin [facility])    ED Discharge Orders    None     8:04 PM uncontrolled diabetic patient not taking his diabetic medication here with right foot pain concerning for gangrene diabetic foot ulcer.  Otherwise vital signs stable, he has an elevated lactic acid of 3.21, and elevated white count of 23.8,And a right foot x-ray with finding  consistent with osteomyelitis involving the head of the first metatarsal and the base of the proximal phalanx of the great toe.  He also has evidence of an abscess amenable for incision and drainage.  Will perform I&D.  His current CBG is 542 with an anion gap of 17.  Code sepsis initiated, IV fluid bolus at 30 mL/kg, antibiotic started including vancomycin and Zosyn.  Will update Tdap.  Plan to consult orthopedist as well as admit patient for further management.  Care discussed with Dr. Tomi Bamberger  9:14 PM Incision and drainage performed by me with moderate amount of purulent discharge and foul gas.  Appreciate consultation from on-call orthopedist, Dr.Xu who agrees patient will need to be admitted.  Dr. Sharol Given will see patient in the morning.   9:43 PM Appreciate consultation from Internal Medicine resident who agrees to see pt in the ER and will admit for further management of his condition.  Pt is made aware of plan and agrees with plan.  Pt currently receiving IVF and abx.  Repeat lactic acid at 4.67, increased from prior.  BP stable, no hypotension or tachycardia.  Doubt septic shock.    9:45 PM Sepsis reassessment done.    CRITICAL CARE Performed by: Domenic Moras Total critical care time: 35 minutes Critical care time was exclusive of separately billable procedures and treating other patients.  Critical care was necessary to treat or prevent imminent or life-threatening deterioration. Critical care was time spent personally by me on the following activities: development of treatment plan with patient and/or surrogate as well as nursing, discussions with consultants, evaluation of patient's response to treatment, examination of patient, obtaining history from patient or surrogate, ordering and performing treatments and interventions, ordering and review of laboratory studies, ordering and review of radiographic studies, pulse oximetry and re-evaluation of patient's condition.    Domenic Moras,  PA-C 03/29/17 2156    Dorie Rank, MD 03/30/17 [phone]

## 2017-03-29 NOTE — ED Provider Notes (Signed)
Patient placed in Quick Look pathway, seen and evaluated   Chief Complaint: foot ulcer  HPI:   Chronic right foot wound worsening x 2 weeks associated with pain, swelling, redness, order, drainage.   ROS: No fevers. H/o DM.  Physical Exam:   Gen: No distress  Neuro: Awake and Alert  Skin: Warm    Focused Exam: Fould smelling draining ulcer to plantar aspect of right MTP with edema, warmth and erythema. Erythema extend past right ankle. Afebrile. HR and SBP normal.          Initiation of care has begun. The patient has been counseled on the process, plan, and necessity for staying for the completion/evaluation, and the remainder of the medical screening examination    Kinnie Feil, PA-C 03/29/17 1654

## 2017-03-29 NOTE — H&P (Addendum)
Date: 03/29/2017               Patient Name:  Trevor [name] MRN: [id number]  DOB: 11/24/1955 Age / Sex: 62 y.o., male   PCP: [name], NP         Medical Service: Internal Medicine Teaching Service         Attending Physician: Dr. Dorie Rank, MD;Hoffman, *    First Contact: Dr. Colbert Ewing Pager: [phone]  Second Contact: Dr. Alphonzo Grieve Pager: [phone]       After Hours (After 5p/  First Contact Pager: [phone]  weekends / holidays): Second Contact Pager: [phone]    Chief Complaint: R foot pain  History of Present Illness:61 yo male with PMH significant for T2DM poorly controlled, HTN, presents with a worsening R diabetic foot ulcer.  He reports first developing this back in October when he saw what appeared to be a callous on his right first metatarsal.  He picked it off and felt it "never really healed right".  He reports two weeks ago it started "looking terrible" and became painful and more edematous.  He denies any recent fevers or chills, SOB, CP, abd pain.  He has not had a bowel movement for the last 4 days.    ED course: Pt arrived with normal vitals and gangrenous right foot found by x-ray to have osteomyelitis.  He had an elevated lactate 3.21, and white blood cell count 23.8k with an increase in his creatinine from a baseline of 2 to 2.80.  He was hyperglycemic with anion gap of 17 with bicarb of 22.  He was given 4L of NS, 10 units of novolog, started on vanc/zosyn and ortho was consulted.    Meds:  Current Meds  Medication Sig  . atenolol (TENORMIN) 100 MG tablet Take 100 mg by mouth daily.  . cyclobenzaprine (FLEXERIL) 10 MG tablet Take 10 mg by mouth 3 (three) times daily as needed for muscle spasms.  . fluticasone (FLONASE) 50 MCG/ACT nasal spray Place 2 sprays into both nostrils daily.  . furosemide (LASIX) 40 MG tablet Take 40 mg by mouth as needed.  [name] glipiZIDE (GLUCOTROL) 10 MG tablet Take 10 mg by mouth 2 (two) times daily.  . insulin  detemir (LEVEMIR) 100 UNIT/ML injection Inject 60 Units into the skin 2 (two) times daily.  [name] losartan (COZAAR) 25 MG tablet Take 100 mg by mouth daily.  . meloxicam (MOBIC) 15 MG tablet Take 15 mg by mouth daily.  . metFORMIN (GLUCOPHAGE) 1000 MG tablet Take 1,000 mg by mouth 2 (two) times daily.  [name] NIFEdipine (PROCARDIA XL/ADALAT-CC) 90 MG 24 hr tablet Take 90 mg by mouth daily.  . potassium chloride SA (K-DUR,KLOR-CON) 20 MEQ tablet Take 1 tablet (20 mEq total) 2 (two) times daily by mouth.  . silver sulfADIAZINE (SILVADENE) 1 % cream Apply 1 application topically daily.  . [DISCONTINUED] traMADol (ULTRAM) 50 MG tablet Take 50 mg by mouth every 6 (six) hours as needed.     Allergies: Allergies as of 03/29/2017 - Review Complete 03/29/2017  Allergen Reaction Noted  . Enalapril Cough 11/22/2016   Past Medical History:  Diagnosis Date  . Diabetes mellitus without complication ([place])   . Heart murmur   . Hyperlipidemia   . Hypertension   . Sleep apnea     Family History: No family history on file.  Social History:  Social History   Socioeconomic History  . Marital status: Single  Spouse name: Not on file  . Number of children: Not on file  . Years of education: Not on file  . Highest education level: Not on file  Social Needs  . Financial resource strain: Not on file  . Food insecurity - worry: Not on file  . Food insecurity - inability: Not on file  . Transportation needs - medical: Not on file  . Transportation needs - non-medical: Not on file  Occupational History  . Not on file  Tobacco Use  . Smoking status: Never Smoker  . Smokeless tobacco: Never Used  Substance and Sexual Activity  . Alcohol use: No  . Drug use: No  . Sexual activity: Not on file  Other Topics Concern  . Not on file  Social History Narrative  . Not on file   Never smoker No alcohol use No illicit drug use  Review of Systems: A complete ROS was negative except as per HPI.    Physical Exam: Blood pressure 136/69, pulse 77, temperature 98 F (36.7 C), temperature source Oral, resp. rate 16, height 6\' 4"  (1.93 m), weight (!) 330 lb (149.7 kg), SpO2 97 %. Physical Exam  Constitutional: He is oriented to person, place, and time. He appears well-developed and well-nourished.  HENT:  Head: Normocephalic and atraumatic.  Eyes: Right eye exhibits no discharge. Left eye exhibits no discharge. No scleral icterus.  Neck: JVD (0.5-1cm above clavicle) present.  Cardiovascular: Normal rate, regular rhythm and intact distal pulses. Exam reveals no gallop and no friction rub.  Murmur (2/6 tricuspid area systolic) heard. Pulmonary/Chest: Effort normal and breath sounds normal. No respiratory distress. He has no wheezes. He has no rales.  Abdominal: Soft. Bowel sounds are normal. He exhibits no distension and no mass. There is no tenderness. There is no guarding.  Musculoskeletal: He exhibits edema (RLE edema 2+).  Neurological: He is alert and oriented to person, place, and time.  Skin:  Right foot erythematous, status pots I and D now draining, edematous small ulcer with deep hole at base see photos  Media Information   Document Information   Photos    03/29/2017 16:43  Attached To:  Hospital Encounter on 03/29/17  Source Information   Arlean Hopping  [facility]    Media Information   Document Information   Photos    03/29/2017 16:42  Attached To:  Hospital Encounter on 03/29/17  Source Information   Kinnie Feil, PA-C  [facility]     EKG: personally reviewed my interpretation is none available  CXR: personally reviewed my interpretation is extremely poor study, can't confidently comment about findings  Assessment & Plan by Problem: Active Problems:   Osteomyelitis (HCC)  R gangrenous foot w/osteomyelitis: pt started on empiric abx, IV fluids, orthopedic surgery consulted  -continue vanc/zosyn -continue IV  fluids -trend lactate -NPO after midnight -AM dvt prophylaxis held -Dilaudid PRN for pain -Dr. Sharol Given will see in am  T2DM: poorly controlled at baseline, arrived hyperglycemic with no ketones, anion gap elevated but normal bicarb.    -SSI resistant Q4    HTN: initially low to normotensive  -will restart bp meds when bp comes up avoiding renal toxic medications    AKI on CKD 3: pt appeared dehydrated on initial admission likely 2/2 hyperglycemia and infection  -fluid resuscitated appropriately -continue maintenance fluids    Dispo: Admit patient to Inpatient with expected length of stay greater than 2 midnights.  Signed: Katherine Roan, MD 03/29/2017, 10:11 PM

## 2017-03-29 NOTE — ED Notes (Signed)
XR at bedside

## 2017-03-29 NOTE — ED Triage Notes (Signed)
Pt in from [facility] health clinic via [place] with CBG of 554 and open, draining dorsal foot ulcer x 2 wks - sent by physician. Afebrile, BP 110/72. A&ox4

## 2017-03-29 NOTE — Progress Notes (Signed)
Patient ID: Trevor [name], male   DOB: 01/12/1955, 62 y.o.   MRN: [id number]    Trevor [name], is a 62 y.o. male  TFT:[id number]  HCW:[id number]  DOB - July 18, 1955  Subjective:  Chief Complaint and HPI: Trevor [name] is a 62 y.o. male here today for C/o 2 week h/o growing ulceration on his R foot.  +uncontrolled DM.  Not compliant with medication regimen.  No f/c.  Unable to ambulate and difficult to get in and out of car.  His great nephew is with him.  Not taking insulin in at least 3 days.    ROS:   Constitutional:  No f/c, No night sweats, No unexplained weight loss. EENT:  No vision changes, No blurry vision, No hearing changes. No mouth, throat, or ear problems.  Respiratory: No cough, No SOB Cardiac: No CP, no palpitations GI:  No abd pain, No N/V/D. GU: No Urinary s/sx Musculoskeletal: +R foot and leg pain Neuro: No headache, no dizziness, no motor weakness.  Skin: No rash Endocrine:  No polydipsia. No polyuria.  Psych: Denies SI/HI  No problems updated.  ALLERGIES: Allergies  Allergen Reactions  . Enalapril Cough    PAST MEDICAL HISTORY: Past Medical History:  Diagnosis Date  . Diabetes mellitus without complication ([place])   . Heart murmur   . Hyperlipidemia   . Hypertension   . Sleep apnea     MEDICATIONS AT HOME: Prior to Admission medications   Medication Sig Start Date End Date Taking? Authorizing Provider  atenolol (TENORMIN) 100 MG tablet Take 100 mg by mouth daily.    [provider]  atorvastatin (LIPITOR) 10 MG tablet Take 10 mg by mouth at bedtime.    [provider]  Blood Glucose Monitoring Suppl (TRUE METRIX METER) w/Device KIT Use as instructed 11/29/16   [name], NP  cyclobenzaprine (FLEXERIL) 10 MG tablet Take 10 mg by mouth 3 (three) times daily as needed for muscle spasms.    [provider]  fluticasone (FLONASE) 50 MCG/ACT nasal spray Place 2 sprays into both nostrils daily.    [provider]  furosemide (LASIX) 40 MG tablet Take 40 mg by mouth as needed.    [provider]  glipiZIDE (GLUCOTROL) 10 MG tablet Take 10 mg by mouth 2 (two) times daily.    [provider]  glucose blood (TRUE METRIX BLOOD GLUCOSE TEST) test strip Use as instructed 11/22/16   [name], NP  insulin detemir (LEVEMIR) 100 UNIT/ML injection Inject 60 Units into the skin 2 (two) times daily.    [provider]  losartan (COZAAR) 25 MG tablet Take 100 mg by mouth daily.    [provider]  meloxicam (MOBIC) 15 MG tablet Take 15 mg by mouth daily. 03/25/15   [provider]  metFORMIN (GLUCOPHAGE) 1000 MG tablet Take 1,000 mg by mouth 2 (two) times daily.    [provider]  NIFEdipine (PROCARDIA XL/ADALAT-CC) 90 MG 24 hr tablet Take 90 mg by mouth daily. 04/24/13   [provider]  potassium chloride SA (K-DUR,KLOR-CON) 20 MEQ tablet Take 1 tablet (20 mEq total) 2 (two) times daily by mouth. 11/23/16 12/23/16  [name], NP  traMADol (ULTRAM) 50 MG tablet Take 50 mg by mouth every 6 (six) hours as needed. 01/16/15   [provider]  TRUEPLUS LANCETS 28G MISC Use as instructed 11/29/16   [name], NP     Objective:  Jasmine December:   Vitals:  03/29/17 1530  BP: 110/72  Pulse: 79  Resp: 16  Temp: 98.4 F (36.9 C)  TempSrc: Oral  SpO2: 94%    General appearance : A&OX3. NAD. Non-toxic-appearing.  In wheel chair HEENT: Atraumatic and Normocephalic.  PERRLA. EOM intact.   Neck: supple, no JVD. No cervical lymphadenopathy. No thyromegaly Chest/Lungs:  Breathing-non-labored, Good air entry bilaterally, breath sounds normal without rales, rhonchi, or wheezing  CVS: S1 S2 regular, no murmurs, gallops, rubs  Extremities: R foot swollen, foul odor, large ulceration over MTP.  Poor vascularization and skin is dark and mottled. Neurology:  CN II-XII grossly intact, Non focal.   Psych:  TP linear. J/I WNL. Normal speech.  Appropriate eye contact and affect.  Skin:  No Rash  Data Review Lab Results  Component Value Date   HGBA1C 10.2 11/22/2016     Assessment & Plan   1. Diabetic ulcer of other part of right foot associated with diabetes mellitus due to underlying condition, unspecified ulcer stage ([place]) To ED via EMS-concern for gangrene.    2. Type 2 diabetes mellitus with hyperglycemia, unspecified whether long term insulin use ([place]) A1C=12.5 today Compliance imperative but to ED today via EMS(per family preference).  No dose changes today because he isn't taking his current doses.   - Glucose (CBG) - HgB A1c - insulin aspart (novoLOG) injection 20 Units now   Patient have been counseled extensively about nutrition and exercise  Return for f/up after hospital stay with Geryl Rankins.  The patient was given clear instructions to go to ER or return to medical center if symptoms don't improve, worsen or new problems develop. The patient verbalized understanding. The patient was told to call to get lab results if they haven't heard anything in the next week.     Freeman Caldron, PA-C [facility] [place], [place]   03/29/2017, 3:41 PM

## 2017-03-30 ENCOUNTER — Encounter (HOSPITAL_COMMUNITY): Payer: Self-pay | Admitting: General Practice

## 2017-03-30 ENCOUNTER — Other Ambulatory Visit (INDEPENDENT_AMBULATORY_CARE_PROVIDER_SITE_OTHER): Payer: Self-pay | Admitting: Orthopedic Surgery

## 2017-03-30 ENCOUNTER — Other Ambulatory Visit: Payer: Self-pay

## 2017-03-30 DIAGNOSIS — L97519 Non-pressure chronic ulcer of other part of right foot with unspecified severity: Secondary | ICD-10-CM

## 2017-03-30 DIAGNOSIS — N179 Acute kidney failure, unspecified: Secondary | ICD-10-CM | POA: Diagnosis present

## 2017-03-30 DIAGNOSIS — E1152 Type 2 diabetes mellitus with diabetic peripheral angiopathy with gangrene: Secondary | ICD-10-CM

## 2017-03-30 DIAGNOSIS — I96 Gangrene, not elsewhere classified: Secondary | ICD-10-CM

## 2017-03-30 DIAGNOSIS — E1165 Type 2 diabetes mellitus with hyperglycemia: Secondary | ICD-10-CM

## 2017-03-30 DIAGNOSIS — N183 Chronic kidney disease, stage 3 unspecified: Secondary | ICD-10-CM | POA: Diagnosis present

## 2017-03-30 DIAGNOSIS — I129 Hypertensive chronic kidney disease with stage 1 through stage 4 chronic kidney disease, or unspecified chronic kidney disease: Secondary | ICD-10-CM

## 2017-03-30 DIAGNOSIS — M86271 Subacute osteomyelitis, right ankle and foot: Secondary | ICD-10-CM

## 2017-03-30 DIAGNOSIS — M869 Osteomyelitis, unspecified: Secondary | ICD-10-CM

## 2017-03-30 DIAGNOSIS — Z79899 Other long term (current) drug therapy: Secondary | ICD-10-CM

## 2017-03-30 DIAGNOSIS — E1142 Type 2 diabetes mellitus with diabetic polyneuropathy: Secondary | ICD-10-CM | POA: Diagnosis present

## 2017-03-30 DIAGNOSIS — E1122 Type 2 diabetes mellitus with diabetic chronic kidney disease: Secondary | ICD-10-CM | POA: Diagnosis present

## 2017-03-30 DIAGNOSIS — A419 Sepsis, unspecified organism: Secondary | ICD-10-CM | POA: Diagnosis present

## 2017-03-30 DIAGNOSIS — E876 Hypokalemia: Secondary | ICD-10-CM

## 2017-03-30 DIAGNOSIS — E11621 Type 2 diabetes mellitus with foot ulcer: Secondary | ICD-10-CM

## 2017-03-30 DIAGNOSIS — Z794 Long term (current) use of insulin: Secondary | ICD-10-CM

## 2017-03-30 DIAGNOSIS — R011 Cardiac murmur, unspecified: Secondary | ICD-10-CM

## 2017-03-30 LAB — CBC
HCT: 33.9 % — ABNORMAL LOW (ref 39.0–52.0)
Hemoglobin: 11.4 g/dL — ABNORMAL LOW (ref 13.0–17.0)
MCH: 26.4 pg (ref 26.0–34.0)
MCHC: 33.6 g/dL (ref 30.0–36.0)
MCV: 78.5 fL (ref 78.0–100.0)
Platelets: 454 10*3/uL — ABNORMAL HIGH (ref 150–400)
RBC: 4.32 MIL/uL (ref 4.22–5.81)
RDW: 13.2 % (ref 11.5–15.5)
WBC: 21.6 10*3/uL — ABNORMAL HIGH (ref 4.0–10.5)

## 2017-03-30 LAB — SURGICAL PCR SCREEN
MRSA, PCR: NEGATIVE
Staphylococcus aureus: NEGATIVE

## 2017-03-30 LAB — HIV ANTIBODY (ROUTINE TESTING W REFLEX): HIV Screen 4th Generation wRfx: NONREACTIVE

## 2017-03-30 LAB — BASIC METABOLIC PANEL
ANION GAP: 11 (ref 5–15)
Anion gap: 9 (ref 5–15)
BUN: 43 mg/dL — ABNORMAL HIGH (ref 6–20)
BUN: 44 mg/dL — ABNORMAL HIGH (ref 6–20)
CHLORIDE: 101 mmol/L (ref 101–111)
CO2: 25 mmol/L (ref 22–32)
CO2: 22 mmol/L (ref 22–32)
CREATININE: 2.06 mg/dL — AB (ref 0.61–1.24)
Calcium: 8.3 mg/dL — ABNORMAL LOW (ref 8.9–10.3)
Calcium: 8.4 mg/dL — ABNORMAL LOW (ref 8.9–10.3)
Chloride: 99 mmol/L — ABNORMAL LOW (ref 101–111)
Creatinine, Ser: 2 mg/dL — ABNORMAL HIGH (ref 0.61–1.24)
GFR calc Af Amer: 39 mL/min — ABNORMAL LOW (ref >60)
GFR calc non Af Amer: 33 mL/min — ABNORMAL LOW (ref >60)
GFR, EST AFRICAN AMERICAN: 40 mL/min — AB (ref >60)
GFR, EST NON AFRICAN AMERICAN: 35 mL/min — AB (ref >60)
GLUCOSE: 296 mg/dL — AB (ref 65–99)
Glucose, Bld: 100 mg/dL — ABNORMAL HIGH (ref 65–99)
POTASSIUM: 3.2 mmol/L — AB (ref 3.5–5.1)
Potassium: 2.8 mmol/L — ABNORMAL LOW (ref 3.5–5.1)
SODIUM: 135 mmol/L (ref 135–145)
Sodium: 132 mmol/L — ABNORMAL LOW (ref 135–145)

## 2017-03-30 LAB — BLOOD CULTURE ID PANEL (REFLEXED)
ACINETOBACTER BAUMANNII: NOT DETECTED
CANDIDA ALBICANS: NOT DETECTED
CANDIDA KRUSEI: NOT DETECTED
CANDIDA PARAPSILOSIS: NOT DETECTED
CANDIDA TROPICALIS: NOT DETECTED
Candida glabrata: NOT DETECTED
ENTEROBACTERIACEAE SPECIES: NOT DETECTED
ENTEROCOCCUS SPECIES: NOT DETECTED
ESCHERICHIA COLI: NOT DETECTED
Enterobacter cloacae complex: NOT DETECTED
Haemophilus influenzae: NOT DETECTED
KLEBSIELLA PNEUMONIAE: NOT DETECTED
Klebsiella oxytoca: NOT DETECTED
LISTERIA MONOCYTOGENES: NOT DETECTED
Neisseria meningitidis: NOT DETECTED
Proteus species: NOT DETECTED
Pseudomonas aeruginosa: NOT DETECTED
SERRATIA MARCESCENS: NOT DETECTED
STREPTOCOCCUS PYOGENES: NOT DETECTED
Staphylococcus aureus (BCID): NOT DETECTED
Staphylococcus species: NOT DETECTED
Streptococcus agalactiae: NOT DETECTED
Streptococcus pneumoniae: NOT DETECTED
Streptococcus species: DETECTED — AB

## 2017-03-30 LAB — LACTIC ACID, PLASMA
LACTIC ACID, VENOUS: 2.8 mmol/L — AB (ref 0.5–1.9)
Lactic Acid, Venous: 1.9 mmol/L (ref 0.5–1.9)

## 2017-03-30 LAB — GLUCOSE, CAPILLARY
GLUCOSE-CAPILLARY: 130 mg/dL — AB (ref 65–99)
GLUCOSE-CAPILLARY: 197 mg/dL — AB (ref 65–99)
Glucose-Capillary: 266 mg/dL — ABNORMAL HIGH (ref 65–99)
Glucose-Capillary: 289 mg/dL — ABNORMAL HIGH (ref 65–99)
Glucose-Capillary: 291 mg/dL — ABNORMAL HIGH (ref 65–99)

## 2017-03-30 MED ORDER — INSULIN GLARGINE 100 UNIT/ML ~~LOC~~ SOLN: 15.0000 [IU] | Freq: Every day | SUBCUTANEOUS | Status: DC

## 2017-03-30 MED ORDER — INSULIN DETEMIR 100 UNIT/ML ~~LOC~~ SOLN
26.0000 [IU] | Freq: Every day | SUBCUTANEOUS | Status: DC
  Administered 2017-03-30 – 2017-04-03 (×4): 26 [IU] via SUBCUTANEOUS
  Filled 2017-03-30 (×5): qty 0.26

## 2017-03-30 MED ORDER — INFLUENZA VAC SPLIT QUAD 0.5 ML IM SUSY
0.5000 mL | PREFILLED_SYRINGE | INTRAMUSCULAR | Status: AC
  Administered 2017-04-02: 0.5 mL via INTRAMUSCULAR
  Filled 2017-03-30: qty 0.5

## 2017-03-30 MED ORDER — INSULIN ASPART 100 UNIT/ML ~~LOC~~ SOLN: 0.0000 [IU] | Freq: Three times a day (TID) | SUBCUTANEOUS | Status: DC

## 2017-03-30 MED ORDER — INSULIN ASPART 100 UNIT/ML ~~LOC~~ SOLN: 0.0000 [IU] | Freq: Every day | SUBCUTANEOUS | Status: DC

## 2017-03-30 MED ORDER — POTASSIUM CHLORIDE CRYS ER 20 MEQ PO TBCR
40.0000 meq | EXTENDED_RELEASE_TABLET | Freq: Two times a day (BID) | ORAL | Status: DC
  Administered 2017-03-30: 40 meq via ORAL
  Filled 2017-03-30: qty 2

## 2017-03-30 MED ORDER — METRONIDAZOLE IN NACL 5-0.79 MG/ML-% IV SOLN
500.0000 mg | Freq: Three times a day (TID) | INTRAVENOUS | Status: DC
  Administered 2017-03-30 – 2017-04-01 (×6): 500 mg via INTRAVENOUS
  Filled 2017-03-30 (×8): qty 100

## 2017-03-30 MED ORDER — CHLORHEXIDINE GLUCONATE 4 % EX LIQD
60.0000 mL | Freq: Once | CUTANEOUS | Status: AC
  Administered 2017-03-31: 4 via TOPICAL
  Filled 2017-03-30: qty 60

## 2017-03-30 MED ORDER — INSULIN ASPART 100 UNIT/ML ~~LOC~~ SOLN
0.0000 [IU] | Freq: Three times a day (TID) | SUBCUTANEOUS | Status: DC
  Administered 2017-03-30: 11 [IU] via SUBCUTANEOUS
  Administered 2017-03-31: 7 [IU] via SUBCUTANEOUS
  Administered 2017-03-31: 4 [IU] via SUBCUTANEOUS
  Administered 2017-04-01: 7 [IU] via SUBCUTANEOUS
  Administered 2017-04-01: 11 [IU] via SUBCUTANEOUS
  Administered 2017-04-01 – 2017-04-02 (×2): 4 [IU] via SUBCUTANEOUS
  Administered 2017-04-02: 11 [IU] via SUBCUTANEOUS
  Administered 2017-04-02 – 2017-04-03 (×4): 7 [IU] via SUBCUTANEOUS
  Administered 2017-04-04: 4 [IU] via SUBCUTANEOUS
  Administered 2017-04-04: 7 [IU] via SUBCUTANEOUS
  Administered 2017-04-04: 11 [IU] via SUBCUTANEOUS

## 2017-03-30 MED ORDER — INSULIN ASPART 100 UNIT/ML ~~LOC~~ SOLN
6.0000 [IU] | Freq: Three times a day (TID) | SUBCUTANEOUS | Status: DC
  Administered 2017-03-30 – 2017-04-02 (×7): 6 [IU] via SUBCUTANEOUS

## 2017-03-30 MED ORDER — SODIUM CHLORIDE 0.9 % IV SOLN
INTRAVENOUS | Status: AC
  Administered 2017-03-30: 16:00:00 via INTRAVENOUS

## 2017-03-30 MED ORDER — POTASSIUM CHLORIDE 10 MEQ/100ML IV SOLN
10.0000 meq | INTRAVENOUS | Status: AC
  Administered 2017-03-30 (×4): 10 meq via INTRAVENOUS
  Filled 2017-03-30 (×4): qty 100

## 2017-03-30 MED ORDER — INSULIN ASPART 100 UNIT/ML ~~LOC~~ SOLN
0.0000 [IU] | Freq: Three times a day (TID) | SUBCUTANEOUS | Status: DC
  Administered 2017-03-30: 4 [IU] via SUBCUTANEOUS

## 2017-03-30 MED ORDER — SODIUM CHLORIDE 0.9 % IV SOLN
2.0000 g | INTRAVENOUS | Status: DC
  Administered 2017-03-30 – 2017-04-01 (×2): 2 g via INTRAVENOUS
  Filled 2017-03-30 (×4): qty 20

## 2017-03-30 MED ORDER — POTASSIUM CHLORIDE CRYS ER 20 MEQ PO TBCR
40.0000 meq | EXTENDED_RELEASE_TABLET | Freq: Once | ORAL | Status: AC
Start: 2017-03-30 — End: 2017-03-30
  Administered 2017-03-30: 40 meq via ORAL
  Filled 2017-03-30: qty 2

## 2017-03-30 MED ORDER — VANCOMYCIN HCL 10 G IV SOLR
1500.0000 mg | INTRAVENOUS | Status: DC
  Filled 2017-03-30: qty 1500

## 2017-03-30 MED ORDER — VANCOMYCIN HCL 10 G IV SOLR
1500.0000 mg | Freq: Once | INTRAVENOUS | Status: AC
  Administered 2017-03-31: 1500 mg via INTRAVENOUS
  Filled 2017-03-30: qty 1500

## 2017-03-30 MED ORDER — INSULIN ASPART 100 UNIT/ML ~~LOC~~ SOLN
0.0000 [IU] | Freq: Every day | SUBCUTANEOUS | Status: DC
  Administered 2017-03-30 – 2017-04-01 (×3): 3 [IU] via SUBCUTANEOUS

## 2017-03-30 MED ORDER — INSULIN ASPART 100 UNIT/ML ~~LOC~~ SOLN: 0.0000 [IU] | SUBCUTANEOUS | Status: DC

## 2017-03-30 MED ORDER — CEFAZOLIN SODIUM-DEXTROSE 2-4 GM/100ML-% IV SOLN
2.0000 g | INTRAVENOUS | Status: DC
  Filled 2017-03-30 (×2): qty 100

## 2017-03-30 NOTE — Progress Notes (Addendum)
   Subjective:  Trevor [name] was seen lying in bed this morning. He endorses pain in his right foot, but feels that the pain medication is helping some. Discussed need for amputation, patient amenable to plan. Plan for surgery Friday.  Objective:  Vital signs in last 24 hours: Vitals:   03/29/17 2315 03/29/17 2330 03/30/17 0035 03/30/17 0408  BP: (!) 157/78 (!) 157/80 (!) 157/81 112/61  Pulse: 82 77 93 81  Resp: 20 19 19 18   Temp:   99.7 F (37.6 C) 98.6 F (37 C)  TempSrc:   Oral Oral  SpO2: 94% 94% 95% 92%  Weight:   296 lb 15.4 oz (134.7 kg)   Height:   6\' 4"  (1.93 m)    GEN: Well-nourished. Alert and oriented. No acute distress.  RESP: Clear to auscultation bilaterally. No wheezes, rales, or rhonchi. No increased work of breathing. CV: Normal rate and regular rhythm. 2/6 systolic murmur in tricuspid area. Swelling of RLE. ABD: Soft. Non-tender. Non-distended. Normoactive bowel sounds. EXT: RLE edema. Warm and well perfused. Right foot erythematous and warm up to mid-shin. Foot wrapped in bandage. NEURO: Cranial nerves II-XII grossly intact. Able to lift all four extremities against gravity. No apparent audiovisual hallucinations. Speech fluent and appropriate. PSYCH: Patient is calm and pleasant. Appropriate affect. Well-groomed; speech is appropriate and on-subject.  Assessment/Plan:  Active Problems:   Osteomyelitis [facility])  Mr. Geraghty is a 62yo male with PMH of poorly controlled T2DM and HTN who presented with 5 months of worsening R foot ulcer, found to have osteomyelitis, currently on Vanc/Zosyn. Noted to have AKI and lactic acidosis, improving with fluids. Ortho has been consulted, plan for amputation 3/22.  R gangrenous foot with osteomyelitis Currently on Vanc/Zosyn. Afebrile, but found to have elevated WBC. Lactic acidosis resolved with fluids. Ortho has been consulted for assistance. BCx drawn and pending. Will change empiric antibiotics to vancomycin, ceftriaxone, and  metronidazole. - Ortho consulted; appreciate their assistance - Ortho to take him to the OR Friday - NPO at MN - Switch antibiotics to vancomycin, ceftriaxone, and metronidazole (3/20 -> ) - F/u BCx 3/20 -> pending - IV dilaudid 1mg  q3h PRN for pain - Monitor fever curve  AKI on CKD 3 Cr 2.8 on admission, improved to 2.08 this AM with IVF. baseline unclear, but most recent labs were 1.5-2.1. Most likely pre-renal secondary to dehydration  - Continue IV LR 140mL/hr - BMP in AM - Avoid nephrotoxic agents  Hypokalemia K 2.8 this AM - PO K 6mEq x2 - IV K 17mEq x4 - BMP in AM  T2DM, poorly controlled at baseline A1c 12.5. Hyperglycemic on admission, now improved after insulin. BG 100-362. - SSI-R TID WC + QHS - CBG monitoring  HTN BP 112/61. Home regimen includes losartan 100mg  daily, nifedipine 90mg  daily, and lasix 40mg  daily PRN - Hold home losartan 2/2 AKI - Hold home nifedipine given normotension, can restart if elevated BP  Dispo: Anticipated discharge in approximately 2-3 day(s).  Colbert Ewing, MD 03/30/2017, 6:34 AM Pager: Mamie Nick [phone]

## 2017-03-30 NOTE — Progress Notes (Signed)
Subjective: The medical team met with Trevor [name] today who was admitted overnight for worsening R diabetic foot ulcer. Patient was lying in bed and tired during the interview, likely due to recent pain medication administration. Discussed the possibility of foot amputation with patient pending ortho consult. Patient was amenable to the plan. Patient scheduled to undergo surgery on Friday.   Objective: Vital signs in last 24 hours: Vitals:   03/29/17 2315 03/29/17 2330 03/30/17 0035 03/30/17 0408  BP: (!) 157/78 (!) 157/80 (!) 157/81 112/61  Pulse: 82 77 93 81  Resp: _0 Temp:   99.7 F (37.6 C) 98.6 F (37 C)  TempSrc:   Oral Oral  SpO2: 94% 94% 95% 92%  Weight:   296 lb 15.4 oz (134.7 kg)   Height:   _1  (1.93 m)    Weight change:   Intake/Output Summary (Last 24 hours) at 03/30/2017 1337 Last data filed at 03/29/2017 2255 Gross per 24 hour  Intake 4546 ml  Output -  Net 4546 ml   BP 112/61 (BP Location: Left Arm)   Pulse 81   Temp 98.6 F (37 C) (Oral)   Resp 18   Ht _2  (1.93 m)   Wt 296 lb 15.4 oz (134.7 kg)   SpO2 92%   BMI 36.15 kg/m   General Appearance:    Alert, cooperative, no distress, appears stated age, lying comfortably in bed, noticeably tired  Lungs:     Clear to auscultation bilaterally, respirations unlabored  Chest wall:    No tenderness or deformity  Heart:    Regular rate and rhythm, S1 and S2 normal, no murmur, rub   or gallop  Abdomen:     Soft, non-tender, bowel sounds active all four quadrants,    no masses, no organomegaly  Extremities:   RLE edematous, warm, and well-perfused. Wrapped in bandage, malodorous  Pulses:   2+ and symmetric all extremities   Lab Results: Recent Results (from the past 2160 hour(s))  Glucose (CBG)     Status: Abnormal   Collection Time: 03/29/17  3:43 PM  Result Value Ref Range   POC Glucose 516 (A) 70 - 99 mg/dl  HgB A1c     Status: Abnormal   Collection Time: 03/29/17  3:43 PM  Result Value Ref  Range   Hemoglobin A1C 26.2   Basic metabolic panel     Status: Abnormal   Collection Time: 03/29/17  4:38 PM  Result Value Ref Range   Sodium 124 (L) 135 - 145 mmol/L   Potassium 3.4 (L) 3.5 - 5.1 mmol/L   Chloride 85 (L) 101 - 111 mmol/L   CO2 22 22 - 32 mmol/L   Glucose, Bld 542 (HH) 65 - 99 mg/dL    Comment: CRITICAL RESULT CALLED TO, READ BACK BY AND VERIFIED WITH: BSabra Heck RN [phone] 1718 GREEN R    BUN 53 (H) 6 - 20 mg/dL   Creatinine, Ser 2.80 (H) 0.61 - 1.24 mg/dL   Calcium 8.9 8.9 - 10.3 mg/dL   GFR calc non Af Amer 23 (L) >60 mL/min   GFR calc Af Amer 27 (L) >60 mL/min    Comment: (NOTE) The eGFR has been calculated using the CKD EPI equation. This calculation has not been validated in all clinical situations. eGFR's persistently <60 mL/min signify possible Chronic Kidney Disease.    Anion gap 17 (H) 5 - 15    Comment: Performed at [facility], [place]  [address] [place], [place] [address]  CBC     Status: Abnormal   Collection Time: 03/29/17  4:38 PM  Result Value Ref Range   WBC 23.8 (H) 4.0 - 10.5 K/uL   RBC 4.85 4.22 - 5.81 MIL/uL   Hemoglobin 13.3 13.0 - 17.0 g/dL   HCT 37.8 (L) 39.0 - 52.0 %   MCV 77.9 (L) 78.0 - 100.0 fL   MCH 27.4 26.0 - 34.0 pg   MCHC 35.2 30.0 - 36.0 g/dL   RDW 12.9 11.5 - 15.5 %   Platelets 533 (H) 150 - 400 K/uL    Comment: Performed at [facility], [place] [address], [place], [place] [address]  I-Stat CG4 Lactic Acid, ED     Status: Abnormal   Collection Time: 03/29/17  5:07 PM  Result Value Ref Range   Lactic Acid, Venous 3.21 (HH) 0.5 - 1.9 mmol/L   Comment NOTIFIED PHYSICIAN   I-Stat CG4 Lactic Acid, ED     Status: Abnormal   Collection Time: 03/29/17  9:00 PM  Result Value Ref Range   Lactic Acid, Venous 4.67 (HH) 0.5 - 1.9 mmol/L   Comment NOTIFIED PHYSICIAN   Urinalysis, Routine w reflex microscopic     Status: Abnormal   Collection Time: 03/29/17  9:37 PM  Result Value Ref Range   Color, Urine YELLOW YELLOW    APPearance CLEAR CLEAR   Specific Gravity, Urine 1.009 1.005 - 1.030   pH 5.0 5.0 - 8.0   Glucose, UA >=500 (A) NEGATIVE mg/dL   Hgb urine dipstick LARGE (A) NEGATIVE   Bilirubin Urine NEGATIVE NEGATIVE   Ketones, ur NEGATIVE NEGATIVE mg/dL   Protein, ur NEGATIVE NEGATIVE mg/dL   Nitrite NEGATIVE NEGATIVE   Leukocytes, UA NEGATIVE NEGATIVE   RBC / HPF TOO NUMEROUS TO COUNT 0 - 5 RBC/hpf   WBC, UA 0-5 0 - 5 WBC/hpf   Bacteria, UA RARE (A) NONE SEEN   Squamous Epithelial / LPF 0-5 (A) NONE SEEN   Mucus PRESENT    Hyaline Casts, UA PRESENT     Comment: Performed at [facility], [address] [place],  [address]  CBG monitoring, ED     Status: Abnormal   Collection Time: 03/29/17  9:48 PM  Result Value Ref Range   Glucose-Capillary 362 (H) 65 - 99 mg/dL  Glucose, capillary     Status: Abnormal   Collection Time: 03/30/17 12:37 AM  Result Value Ref Range   Glucose-Capillary 266 (H) 65 - 99 mg/dL  HIV antibody (Routine Testing)     Status: None   Collection Time: 03/30/17  2:44 AM  Result Value Ref Range   HIV Screen 4th Generation wRfx Non Reactive Non Reactive    Comment: (NOTE) Performed At: [facility] [place], [place] [id number] [address] Trevor [name] [phone] Performed at [facility], [place] [address], [place], [place] [address]   Lactic acid, plasma     Status: Abnormal   Collection Time: 03/30/17  2:44 AM  Result Value Ref Range   Lactic Acid, Venous 2.8 (HH) 0.5 - 1.9 mmol/L    Comment: CRITICAL RESULT CALLED TO, READ BACK BY AND VERIFIED WITH: Marylouise Stacks RN 03/30/2017 0345 JORDANS Performed at [facility], [place] [address] [place], [place] [address]   Glucose, capillary     Status: Abnormal   Collection Time: 03/30/17  4:06 AM  Result Value Ref Range   Glucose-Capillary 130 (H) 65 - 99  mg/dL  Basic metabolic panel     Status: Abnormal   Collection Time: 03/30/17  6:03 AM  Result Value Ref Range   Sodium 135 135  - 145 mmol/L   Potassium 2.8 (L) 3.5 - 5.1 mmol/L   Chloride 101 101 - 111 mmol/L   CO2 25 22 - 32 mmol/L   Glucose, Bld 100 (H) 65 - 99 mg/dL   BUN 43 (H) 6 - 20 mg/dL   Creatinine, Ser 2.06 (H) 0.61 - 1.24 mg/dL   Calcium 8.3 (L) 8.9 - 10.3 mg/dL   GFR calc non Af Amer 33 (L) >60 mL/min   GFR calc Af Amer 39 (L) >60 mL/min    Comment: (NOTE) The eGFR has been calculated using the CKD EPI equation. This calculation has not been validated in all clinical situations. eGFR's persistently <60 mL/min signify possible Chronic Kidney Disease.    Anion gap 9.0 5 - 15    Comment: Performed at [facility] [address], [place], [place] [address]  Lactic acid, plasma     Status: None   Collection Time: 03/30/17  6:03 AM  Result Value Ref Range   Lactic Acid, Venous 1.9 0.5 - 1.9 mmol/L    Comment: Performed at [facility] [address] [place], [place] [address]  CBC     Status: Abnormal   Collection Time: 03/30/17  6:03 AM  Result Value Ref Range   WBC 21.6 (H) 4.0 - 10.5 K/uL   RBC 4.32 4.22 - 5.81 MIL/uL   Hemoglobin 11.4 (L) 13.0 - 17.0 g/dL   HCT 33.9 (L) 39.0 - 52.0 %   MCV 78.5 78.0 - 100.0 fL   MCH 26.4 26.0 - 34.0 pg   MCHC 33.6 30.0 - 36.0 g/dL   RDW 13.2 11.5 - 15.5 %   Platelets 454 (H) 150 - 400 K/uL    Comment: Performed at [facility], [place] [address] [place], [place] [address]  Glucose, capillary     Status: Abnormal   Collection Time: 03/30/17 12:47 PM  Result Value Ref Range   Glucose-Capillary 197 (H) 65 - 99 mg/dL    Micro Results: No results found for this or any previous visit (from the past 240 hour(s)). Studies/Results: Dg Chest Port 1 View  Result Date: 03/29/2017 CLINICAL DATA:  Fever EXAM: PORTABLE CHEST 1 VIEW COMPARISON:  None. FINDINGS: Mildly low lung volumes with atelectasis at the left base. No focal consolidation. Heart size upper normal. No pneumothorax. IMPRESSION: Low lung volumes with minimal atelectasis at the left  base. Electronically Signed   By: Donavan Foil M.D.   On: 03/29/2017 20:58   Dg Foot Complete Right  Result Date: 03/29/2017 CLINICAL DATA:  Patient with a wound on the plantar surface of the right foot for 3 weeks. Pain and swelling. EXAM: RIGHT FOOT COMPLETE - 3+ VIEW COMPARISON:  None. FINDINGS: Soft tissue wound is seen adjacent to the head of the first metatarsal. Soft tissues are swollen and there is gas extending from the IP joint of the great toe to medial aspect of the talar neck. There is bony destructive change in the metatarsal head medially consistent with osteomyelitis. Bony destructive change is also seen in the base of the proximal phalanx of the great toe. No fracture is identified. IMPRESSION: Findings consistent with osteomyelitis in the head of the first metatarsal and base of the proximal phalanx of the great toe. Marked soft tissue swelling with extensive soft  tissue gas extending from the IP joint of the great toe to the neck of the talus. Electronically Signed   By: Inge Rise M.D.   On: 03/29/2017 18:24   Medications: I have reviewed the patient's current medications. Scheduled Meds: . [START ON 03/31/2017] Influenza vac split quadrivalent PF  0.5 mL Intramuscular Tomorrow-1000  . insulin aspart  0-20 Units Subcutaneous TID WC  . insulin aspart  0-5 Units Subcutaneous QHS  . polyethylene glycol  17 g Oral Daily  . potassium chloride  40 mEq Oral BID  . senna-docusate  1 tablet Oral QHS   Continuous Infusions: . cefTRIAXone (ROCEPHIN)  IV    . lactated ringers 125 mL/hr at 03/29/17 2316  . metronidazole     PRN Meds:.acetaminophen **OR** acetaminophen, HYDROmorphone (DILAUDID) injection Assessment/Plan: Active Problems:   Osteomyelitis [facility])  Trevor [name] is a 62 y.o. Male with PMH of poorly controlled T2DM and HTN p/w 5 months of worsening R foot ulcer. On imaging, the patient was found to have osteomyelitis and started on Vanc/Zosyn. Ortho has been consulted  with plan to amputate on 03/22.   #R Gangrenous Foot with Osteomyelitis. Currently on Vancomycin/Zosyn. Afebrile, but has an elevated WBC this morning at 21.6 (down from 23.8 from 03/20). Lactic acid levels at 1.9, stabilized from yesterday. Ortho has seen patient and planning on surgery Friday to salvage foot. Blood cultures are pending. Empiric abx changed to vancomycin, ceftriaxone, and metronidazole.  - Ortho consulted, recs appreciated.  - Surgery on 03/22, NPO at midnight - Abx switched from Vanc/Zosyn >> Vanc/Ceftriaxone/Metronidazole - F/u blood cultures - Monitor fevers - IV Dilaudid 1 mg q3h PRN for pain  #AKI on CKD Stage 3. Most recent Creatinine at 2.06 (down from 2.80). Baseline creatinine unclear.  - Continue IV LR 125 cc/hr - Repeat BMP in AM - Avoid nephrotoxic agents  #Hypokalemia K 2.8 this AM - PO Potassium 104mq x2 - IV Potassium 143m x4 - BMP in AM  #T2DM Current A1c at 12.5, hyperglycemic on admission with CBG via [facility] at 554. Currently improved after insulin with BGs at 100-362 while admitted. - SSI-R TID WC + QHS - CBG monitoring  #Hypertension BP 112/61. Home regimen includes losartan 1001maily, nifedipine 99m60mily, and lasix 40mg91mly PRN - Hold home losartan 2/2 AKI - Hold home nifedipine given normotension, can restart if elevated BP   Dispo: Anticipated ~2-3 days  This is a [employer] information officer.  The care of the patient was discussed with Dr. HuangRonalee Redthe assessment and plan formulated with their assistance.  Please see their attached note for official documentation of the daily encounter.   LOS: 1 day   GhodkToy Cookeyical Student 03/30/2017, 1:37 PM

## 2017-03-30 NOTE — Progress Notes (Signed)
PHARMACY - PHYSICIAN COMMUNICATION CRITICAL VALUE ALERT - BLOOD CULTURE IDENTIFICATION (BCID)  Trevor [name] is an 63 y.o. male who presented to [facility] on 03/29/2017.  Assessment:  Gangrenous foot with osteomyelitis   Name of physician (or Provider) Contacted: IM resident  Current antibiotics: Ceftriaxone/Flagyl/vancomycin  Changes to prescribed antibiotics recommended:  Patient is on recommended antibiotics - No changes needed  Results for orders placed or performed during the hospital encounter of 03/29/17  Blood Culture ID Panel (Reflexed) (Collected: 03/29/2017  8:15 PM)  Result Value Ref Range   Enterococcus species NOT DETECTED NOT DETECTED   Listeria monocytogenes NOT DETECTED NOT DETECTED   Staphylococcus species NOT DETECTED NOT DETECTED   Staphylococcus aureus NOT DETECTED NOT DETECTED   Streptococcus species DETECTED (A) NOT DETECTED   Streptococcus agalactiae NOT DETECTED NOT DETECTED   Streptococcus pneumoniae NOT DETECTED NOT DETECTED   Streptococcus pyogenes NOT DETECTED NOT DETECTED   Acinetobacter baumannii NOT DETECTED NOT DETECTED   Enterobacteriaceae species NOT DETECTED NOT DETECTED   Enterobacter cloacae complex NOT DETECTED NOT DETECTED   Escherichia coli NOT DETECTED NOT DETECTED   Klebsiella oxytoca NOT DETECTED NOT DETECTED   Klebsiella pneumoniae NOT DETECTED NOT DETECTED   Proteus species NOT DETECTED NOT DETECTED   Serratia marcescens NOT DETECTED NOT DETECTED   Haemophilus influenzae NOT DETECTED NOT DETECTED   Neisseria meningitidis NOT DETECTED NOT DETECTED   Pseudomonas aeruginosa NOT DETECTED NOT DETECTED   Candida albicans NOT DETECTED NOT DETECTED   Candida glabrata NOT DETECTED NOT DETECTED   Candida krusei NOT DETECTED NOT DETECTED   Candida parapsilosis NOT DETECTED NOT DETECTED   Candida tropicalis NOT DETECTED NOT DETECTED    Harvel Quale 03/30/2017  11:07 PM

## 2017-03-30 NOTE — H&P (View-Only) (Signed)
ORTHOPAEDIC CONSULTATION  REQUESTING PHYSICIAN: Lucious Groves, DO  Chief Complaint: Purulent abscess and swelling right foot.  HPI: Trevor [name] is a 62 y.o. male who presents with diabetic insensate neuropathy uncontrolled states he has had a 68-month history of ulceration drainage swelling right great toe first metatarsal head.  Past Medical History:  Diagnosis Date  . Diabetes mellitus without complication ([place])   . Heart murmur   . Hyperlipidemia   . Hypertension   . Sleep apnea    Past Surgical History:  Procedure Laterality Date  . CORNEAL TRANSPLANT     Social History   Socioeconomic History  . Marital status: Single    Spouse name: Not on file  . Number of children: Not on file  . Years of education: Not on file  . Highest education level: Not on file  Occupational History  . Not on file  Social Needs  . Financial resource strain: Not on file  . Food insecurity:    Worry: Not on file    Inability: Not on file  . Transportation needs:    Medical: Not on file    Non-medical: Not on file  Tobacco Use  . Smoking status: Never Smoker  . Smokeless tobacco: Never Used  Substance and Sexual Activity  . Alcohol use: No  . Drug use: No  . Sexual activity: Not on file  Lifestyle  . Physical activity:    Days per week: Not on file    Minutes per session: Not on file  . Stress: Not on file  Relationships  . Social connections:    Talks on phone: Not on file    Gets together: Not on file    Attends religious service: Not on file    Active member of club or organization: Not on file    Attends meetings of clubs or organizations: Not on file    Relationship status: Not on file  Other Topics Concern  . Not on file  Social History Narrative  . Not on file   History reviewed. No pertinent family history. - negative except otherwise stated in the family history section Allergies  Allergen Reactions  . Enalapril Cough   Prior to Admission  medications   Medication Sig Start Date End Date Taking? Authorizing Provider  atenolol (TENORMIN) 100 MG tablet Take 100 mg by mouth daily.   Yes [provider]  cyclobenzaprine (FLEXERIL) 10 MG tablet Take 10 mg by mouth 3 (three) times daily as needed for muscle spasms.   Yes [provider]  fluticasone (FLONASE) 50 MCG/ACT nasal spray Place 2 sprays into both nostrils daily.   Yes [provider]  furosemide (LASIX) 40 MG tablet Take 40 mg by mouth as needed.   Yes [provider]  glipiZIDE (GLUCOTROL) 10 MG tablet Take 10 mg by mouth 2 (two) times daily.   Yes [provider]  insulin detemir (LEVEMIR) 100 UNIT/ML injection Inject 60 Units into the skin 2 (two) times daily.   Yes [provider]  losartan (COZAAR) 25 MG tablet Take 100 mg by mouth daily.   Yes [provider]  meloxicam (MOBIC) 15 MG tablet Take 15 mg by mouth daily. 03/25/15  Yes [provider]  metFORMIN (GLUCOPHAGE) 1000 MG tablet Take 1,000 mg by mouth 2 (two) times daily.   Yes [provider]  NIFEdipine (PROCARDIA XL/ADALAT-CC) 90 MG 24 hr tablet Take 90 mg by mouth daily. 04/24/13  Yes [provider]  potassium chloride SA (K-DUR,KLOR-CON) 20 MEQ tablet Take 1 tablet (20 mEq total) 2 (two) times daily by mouth. 11/23/16 03/29/17 Yes [name], NP  silver sulfADIAZINE (SILVADENE) 1 % cream Apply 1 application topically daily.   Yes [provider]  glucose blood (TRUE METRIX BLOOD GLUCOSE TEST) test strip Use as instructed Patient not taking: Reported on 03/29/2017 11/22/16   [name], NP   Dg Chest Port 1 View  Result Date: 03/29/2017 CLINICAL DATA:  Fever EXAM: PORTABLE CHEST 1 VIEW COMPARISON:  None. FINDINGS: Mildly low lung volumes with atelectasis at the left base. No focal consolidation. Heart size upper normal. No pneumothorax. IMPRESSION: Low lung volumes with minimal atelectasis at the left base.  Electronically Signed   By: Donavan Foil M.D.   On: 03/29/2017 20:58   Dg Foot Complete Right  Result Date: 03/29/2017 CLINICAL DATA:  Patient with a wound on the plantar surface of the right foot for 3 weeks. Pain and swelling. EXAM: RIGHT FOOT COMPLETE - 3+ VIEW COMPARISON:  None. FINDINGS: Soft tissue wound is seen adjacent to the head of the first metatarsal. Soft tissues are swollen and there is gas extending from the IP joint of the great toe to medial aspect of the talar neck. There is bony destructive change in the metatarsal head medially consistent with osteomyelitis. Bony destructive change is also seen in the base of the proximal phalanx of the great toe. No fracture is identified. IMPRESSION: Findings consistent with osteomyelitis in the head of the first metatarsal and base of the proximal phalanx of the great toe. Marked soft tissue swelling with extensive soft tissue gas extending from the IP joint of the great toe to the neck of the talus. Electronically Signed   By: Inge Rise M.D.   On: 03/29/2017 18:24   - pertinent xrays, CT, MRI studies were reviewed and independently interpreted  Positive ROS: All other systems have been reviewed and were otherwise negative with the exception of those mentioned in the HPI and as above.  Physical Exam: General: Alert, no acute distress Psychiatric: Patient is competent for consent with normal mood and affect Lymphatic: No axillary or cervical lymphadenopathy Cardiovascular: No pedal edema Respiratory: No cyanosis, no use of accessory musculature GI: No organomegaly, abdomen is soft and non-tender  Skin: Patient has necrotic tissue globally around the first metatarsal head and second metatarsal head with purulent drainage cellulitis blister and necrotic skin.  Images:  @ENCIMAGES @   Neurologic: Patient does not have protective sensation bilateral lower extremities.   MUSCULOSKELETAL:  Patient has a good dorsalis pedis and  posterior tibial pulse.  There is purulent drainage from the first metatarsal head.  Patient has infected soft tissue envelope around the forefoot with no ascending cellulitis.  Radiographs are consistent with osteomyelitis of the first metatarsal head.  Patient has uncontrolled diabetes with a hemoglobin A1c of 12.5.  Assessment: Assessment: Diabetic insensate neuropathy with large abscess around the first metatarsal head right foot.  Plan: Plan: We will plan for foot salvage attempt with amputation of the first and second ray right foot.  Discussed that if the infection extends beyond the first and second rays that with patient's size he would not be a candidate for any further foot salvage intervention.  Plan for surgery on Friday.  Thank you for the consult and the opportunity to see Trevor [name], [place] [phone] 8:55 AM

## 2017-03-30 NOTE — Progress Notes (Signed)
Chaplain visited with PT for prayer and to complete an AD.  Family was present at signing and asked for prayer

## 2017-03-30 NOTE — Consult Note (Signed)
ORTHOPAEDIC CONSULTATION  REQUESTING PHYSICIAN: Lucious Groves, DO  Chief Complaint: Purulent abscess and swelling right foot.  HPI: Trevor [name] is a 62 y.o. male who presents with diabetic insensate neuropathy uncontrolled states he has had a 38-month history of ulceration drainage swelling right great toe first metatarsal head.  Past Medical History:  Diagnosis Date  . Diabetes mellitus without complication ([place])   . Heart murmur   . Hyperlipidemia   . Hypertension   . Sleep apnea    Past Surgical History:  Procedure Laterality Date  . CORNEAL TRANSPLANT     Social History   Socioeconomic History  . Marital status: Single    Spouse name: Not on file  . Number of children: Not on file  . Years of education: Not on file  . Highest education level: Not on file  Occupational History  . Not on file  Social Needs  . Financial resource strain: Not on file  . Food insecurity:    Worry: Not on file    Inability: Not on file  . Transportation needs:    Medical: Not on file    Non-medical: Not on file  Tobacco Use  . Smoking status: Never Smoker  . Smokeless tobacco: Never Used  Substance and Sexual Activity  . Alcohol use: No  . Drug use: No  . Sexual activity: Not on file  Lifestyle  . Physical activity:    Days per week: Not on file    Minutes per session: Not on file  . Stress: Not on file  Relationships  . Social connections:    Talks on phone: Not on file    Gets together: Not on file    Attends religious service: Not on file    Active member of club or organization: Not on file    Attends meetings of clubs or organizations: Not on file    Relationship status: Not on file  Other Topics Concern  . Not on file  Social History Narrative  . Not on file   History reviewed. No pertinent family history. - negative except otherwise stated in the family history section Allergies  Allergen Reactions  . Enalapril Cough   Prior to Admission  medications   Medication Sig Start Date End Date Taking? Authorizing Provider  atenolol (TENORMIN) 100 MG tablet Take 100 mg by mouth daily.   Yes [provider]  cyclobenzaprine (FLEXERIL) 10 MG tablet Take 10 mg by mouth 3 (three) times daily as needed for muscle spasms.   Yes [provider]  fluticasone (FLONASE) 50 MCG/ACT nasal spray Place 2 sprays into both nostrils daily.   Yes [provider]  furosemide (LASIX) 40 MG tablet Take 40 mg by mouth as needed.   Yes [provider]  glipiZIDE (GLUCOTROL) 10 MG tablet Take 10 mg by mouth 2 (two) times daily.   Yes [provider]  insulin detemir (LEVEMIR) 100 UNIT/ML injection Inject 60 Units into the skin 2 (two) times daily.   Yes [provider]  losartan (COZAAR) 25 MG tablet Take 100 mg by mouth daily.   Yes [provider]  meloxicam (MOBIC) 15 MG tablet Take 15 mg by mouth daily. 03/25/15  Yes [provider]  metFORMIN (GLUCOPHAGE) 1000 MG tablet Take 1,000 mg by mouth 2 (two) times daily.   Yes [provider]  NIFEdipine (PROCARDIA XL/ADALAT-CC) 90 MG 24 hr tablet Take 90 mg by mouth daily. 04/24/13  Yes [provider]  potassium chloride SA (K-DUR,KLOR-CON) 20 MEQ tablet Take 1 tablet (20 mEq total) 2 (two) times daily by mouth. 11/23/16 03/29/17 Yes [name], NP  silver sulfADIAZINE (SILVADENE) 1 % cream Apply 1 application topically daily.   Yes [provider]  glucose blood (TRUE METRIX BLOOD GLUCOSE TEST) test strip Use as instructed Patient not taking: Reported on 03/29/2017 11/22/16   [name], NP   Dg Chest Port 1 View  Result Date: 03/29/2017 CLINICAL DATA:  Fever EXAM: PORTABLE CHEST 1 VIEW COMPARISON:  None. FINDINGS: Mildly low lung volumes with atelectasis at the left base. No focal consolidation. Heart size upper normal. No pneumothorax. IMPRESSION: Low lung volumes with minimal atelectasis at the left base.  Electronically Signed   By: Donavan Foil M.D.   On: 03/29/2017 20:58   Dg Foot Complete Right  Result Date: 03/29/2017 CLINICAL DATA:  Patient with a wound on the plantar surface of the right foot for 3 weeks. Pain and swelling. EXAM: RIGHT FOOT COMPLETE - 3+ VIEW COMPARISON:  None. FINDINGS: Soft tissue wound is seen adjacent to the head of the first metatarsal. Soft tissues are swollen and there is gas extending from the IP joint of the great toe to medial aspect of the talar neck. There is bony destructive change in the metatarsal head medially consistent with osteomyelitis. Bony destructive change is also seen in the base of the proximal phalanx of the great toe. No fracture is identified. IMPRESSION: Findings consistent with osteomyelitis in the head of the first metatarsal and base of the proximal phalanx of the great toe. Marked soft tissue swelling with extensive soft tissue gas extending from the IP joint of the great toe to the neck of the talus. Electronically Signed   By: Inge Rise M.D.   On: 03/29/2017 18:24   - pertinent xrays, CT, MRI studies were reviewed and independently interpreted  Positive ROS: All other systems have been reviewed and were otherwise negative with the exception of those mentioned in the HPI and as above.  Physical Exam: General: Alert, no acute distress Psychiatric: Patient is competent for consent with normal mood and affect Lymphatic: No axillary or cervical lymphadenopathy Cardiovascular: No pedal edema Respiratory: No cyanosis, no use of accessory musculature GI: No organomegaly, abdomen is soft and non-tender  Skin: Patient has necrotic tissue globally around the first metatarsal head and second metatarsal head with purulent drainage cellulitis blister and necrotic skin.  Images:  @ENCIMAGES @   Neurologic: Patient does not have protective sensation bilateral lower extremities.   MUSCULOSKELETAL:  Patient has a good dorsalis pedis and  posterior tibial pulse.  There is purulent drainage from the first metatarsal head.  Patient has infected soft tissue envelope around the forefoot with no ascending cellulitis.  Radiographs are consistent with osteomyelitis of the first metatarsal head.  Patient has uncontrolled diabetes with a hemoglobin A1c of 12.5.  Assessment: Assessment: Diabetic insensate neuropathy with large abscess around the first metatarsal head right foot.  Plan: Plan: We will plan for foot salvage attempt with amputation of the first and second ray right foot.  Discussed that if the infection extends beyond the first and second rays that with patient's size he would not be a candidate for any further foot salvage intervention.  Plan for surgery on Friday.  Thank you for the consult and the opportunity to see Trevor [name], [place] [phone] 8:55 AM

## 2017-03-30 NOTE — Progress Notes (Signed)
Pharmacy Antibiotic Note  Trevor [name] is a 62 y.o. male admitted on 03/29/2017 with sepsis and cellulitis.  Pharmacy dosing Vancomycin and Zosyn. WBC 23.8>21.6, LA 3.21>1.9. SCr 2.80>2.06, normalized CrCl 35-40 mL/min.   Plan: Zosyn 3.375g IV x1 over 43min, then 3.375g IV q8h-4hr infusion. Increase vancomycin to 1250 mg IV Q 24 hours  Follow-up renal function, culture results, and clinical status.   Height: 6\' 4"  (193 cm) Weight: 296 lb 15.4 oz (134.7 kg) IBW/kg (Calculated) : 86.8  Temp (24hrs), Avg:98.7 F (37.1 C), Min:98 F (36.7 C), Max:99.7 F (37.6 C)  Recent Labs  Lab 03/29/17 1638 03/29/17 1707 03/29/17 2100 03/30/17 0244 03/30/17 0603  WBC 23.8*  --   --   --  21.6*  CREATININE 2.80*  --   --   --  2.06*  LATICACIDVEN  --  3.21* 4.67* 2.8* 1.9    Estimated Creatinine Clearance: 57.2 mL/min (A) (by C-G formula based on SCr of 2.06 mg/dL (H)).    Allergies  Allergen Reactions  . Enalapril Cough    Antimicrobials this admission: Vancomycin 3/20 >> Zosyn 3/20 >>  Dose adjustments this admission:   Microbiology results: 3/20 Blood >> 3/20 Urine >>  Thank you for allowing pharmacy to be a part of this patient's care.  Albertina Parr, PharmD., BCPS Clinical Pharmacist Clinical phone for 03/30/17 until 3:30pm: [phone] If after 3:30pm, please call main pharmacy at: [phone]

## 2017-03-31 ENCOUNTER — Encounter (HOSPITAL_COMMUNITY): Payer: Self-pay | Admitting: *Deleted

## 2017-03-31 ENCOUNTER — Inpatient Hospital Stay (HOSPITAL_COMMUNITY): Payer: Medicare Other | Admitting: Anesthesiology

## 2017-03-31 ENCOUNTER — Encounter (HOSPITAL_COMMUNITY): Payer: Self-pay

## 2017-03-31 ENCOUNTER — Encounter (HOSPITAL_COMMUNITY)
Admission: EM | Disposition: A | Discharge status: Rehabilitation Facility | Payer: Self-pay | Source: Home / Self Care | Attending: Internal Medicine

## 2017-03-31 DIAGNOSIS — Z89421 Acquired absence of other right toe(s): Secondary | ICD-10-CM

## 2017-03-31 DIAGNOSIS — B955 Unspecified streptococcus as the cause of diseases classified elsewhere: Secondary | ICD-10-CM

## 2017-03-31 DIAGNOSIS — E1165 Type 2 diabetes mellitus with hyperglycemia: Secondary | ICD-10-CM

## 2017-03-31 HISTORY — PX: AMPUTATION: SHX166

## 2017-03-31 LAB — GLUCOSE, CAPILLARY
GLUCOSE-CAPILLARY: 225 mg/dL — AB (ref 65–99)
GLUCOSE-CAPILLARY: 193 mg/dL — AB (ref 65–99)
GLUCOSE-CAPILLARY: 253 mg/dL — AB (ref 65–99)
Glucose-Capillary: 187 mg/dL — ABNORMAL HIGH (ref 65–99)
Glucose-Capillary: 173 mg/dL — ABNORMAL HIGH (ref 65–99)

## 2017-03-31 LAB — BASIC METABOLIC PANEL
ANION GAP: 12 (ref 5–15)
BUN: 33 mg/dL — ABNORMAL HIGH (ref 6–20)
CALCIUM: 8.2 mg/dL — AB (ref 8.9–10.3)
CHLORIDE: 101 mmol/L (ref 101–111)
CO2: 21 mmol/L — AB (ref 22–32)
Creatinine, Ser: 1.62 mg/dL — ABNORMAL HIGH (ref 0.61–1.24)
GFR calc Af Amer: 52 mL/min — ABNORMAL LOW (ref >60)
GFR calc non Af Amer: 45 mL/min — ABNORMAL LOW (ref >60)
GLUCOSE: 220 mg/dL — AB (ref 65–99)
Potassium: 3.1 mmol/L — ABNORMAL LOW (ref 3.5–5.1)
Sodium: 134 mmol/L — ABNORMAL LOW (ref 135–145)

## 2017-03-31 LAB — URINE CULTURE: Culture: <10000 — AB

## 2017-03-31 LAB — CBC
HCT: 32.9 % — ABNORMAL LOW (ref 39.0–52.0)
HEMOGLOBIN: 11 g/dL — AB (ref 13.0–17.0)
MCH: 26.3 pg (ref 26.0–34.0)
MCHC: 33.4 g/dL (ref 30.0–36.0)
MCV: 78.5 fL (ref 78.0–100.0)
Platelets: 461 10*3/uL — ABNORMAL HIGH (ref 150–400)
RBC: 4.19 MIL/uL — ABNORMAL LOW (ref 4.22–5.81)
RDW: 13.3 % (ref 11.5–15.5)
WBC: 16.3 10*3/uL — ABNORMAL HIGH (ref 4.0–10.5)

## 2017-03-31 SURGERY — AMPUTATION, FOOT, RAY
Anesthesia: Monitor Anesthesia Care | Site: Foot | Laterality: Right

## 2017-03-31 MED ORDER — KETOROLAC TROMETHAMINE 30 MG/ML IJ SOLN: 30.0000 mg | Freq: Once | INTRAMUSCULAR | Status: DC | PRN

## 2017-03-31 MED ORDER — ONDANSETRON HCL 4 MG/2ML IJ SOLN: 4.0000 mg | Freq: Four times a day (QID) | INTRAMUSCULAR | Status: DC | PRN

## 2017-03-31 MED ORDER — SODIUM CHLORIDE 0.9 % IV SOLN
INTRAVENOUS | Status: DC | PRN
  Administered 2017-03-31: 12:00:00 via INTRAVENOUS

## 2017-03-31 MED ORDER — FENTANYL CITRATE (PF) 100 MCG/2ML IJ SOLN: 25.0000 ug | INTRAMUSCULAR | Status: DC | PRN

## 2017-03-31 MED ORDER — DOCUSATE SODIUM 100 MG PO CAPS
100.0000 mg | ORAL_CAPSULE | Freq: Two times a day (BID) | ORAL | Status: DC
  Administered 2017-03-31: 100 mg via ORAL
  Filled 2017-03-31: qty 1

## 2017-03-31 MED ORDER — PROPOFOL 10 MG/ML IV BOLUS
INTRAVENOUS | Status: DC | PRN
  Administered 2017-03-31: 20 mg via INTRAVENOUS
  Administered 2017-03-31: 30 mg via INTRAVENOUS
  Administered 2017-03-31: 20 mg via INTRAVENOUS

## 2017-03-31 MED ORDER — FENTANYL CITRATE (PF) 100 MCG/2ML IJ SOLN
INTRAMUSCULAR | Status: AC
  Administered 2017-03-31: 50 ug via INTRAVENOUS
  Filled 2017-03-31: qty 2

## 2017-03-31 MED ORDER — OXYCODONE HCL 5 MG/5ML PO SOLN: 5.0000 mg | Freq: Once | ORAL | Status: DC | PRN

## 2017-03-31 MED ORDER — ROPIVACAINE HCL 5 MG/ML IJ SOLN
INTRAMUSCULAR | Status: DC | PRN
  Administered 2017-03-31 (×6): 5 mL via PERINEURAL

## 2017-03-31 MED ORDER — POLYETHYLENE GLYCOL 3350 17 G PO PACK: 17.0000 g | PACK | Freq: Every day | ORAL | Status: DC | PRN

## 2017-03-31 MED ORDER — METOCLOPRAMIDE HCL 5 MG PO TABS: 5.0000 mg | ORAL_TABLET | Freq: Three times a day (TID) | ORAL | Status: DC | PRN

## 2017-03-31 MED ORDER — MIDAZOLAM HCL 2 MG/2ML IJ SOLN
1.0000 mg | Freq: Once | INTRAMUSCULAR | Status: AC
  Administered 2017-03-31: 1 mg via INTRAVENOUS

## 2017-03-31 MED ORDER — OXYCODONE HCL 5 MG PO TABS: 5.0000 mg | ORAL_TABLET | Freq: Once | ORAL | Status: DC | PRN

## 2017-03-31 MED ORDER — METOCLOPRAMIDE HCL 5 MG/ML IJ SOLN: 5.0000 mg | Freq: Three times a day (TID) | INTRAMUSCULAR | Status: DC | PRN

## 2017-03-31 MED ORDER — POTASSIUM CHLORIDE CRYS ER 20 MEQ PO TBCR
40.0000 meq | EXTENDED_RELEASE_TABLET | Freq: Two times a day (BID) | ORAL | Status: AC
  Administered 2017-03-31 (×2): 40 meq via ORAL
  Filled 2017-03-31 (×2): qty 2

## 2017-03-31 MED ORDER — SODIUM CHLORIDE 0.9 % IV SOLN
Freq: Once | INTRAVENOUS | Status: AC
  Administered 2017-03-31: 11:00:00 via INTRAVENOUS

## 2017-03-31 MED ORDER — METHOCARBAMOL 500 MG PO TABS
500.0000 mg | ORAL_TABLET | Freq: Four times a day (QID) | ORAL | Status: DC | PRN
  Filled 2017-03-31: qty 1

## 2017-03-31 MED ORDER — ACETAMINOPHEN 325 MG PO TABS: 325.0000 mg | ORAL_TABLET | ORAL | Status: DC | PRN

## 2017-03-31 MED ORDER — ONDANSETRON HCL 4 MG/2ML IJ SOLN
INTRAMUSCULAR | Status: AC
  Filled 2017-03-31: qty 2

## 2017-03-31 MED ORDER — BISACODYL 10 MG RE SUPP: 10.0000 mg | Freq: Every day | RECTAL | Status: DC | PRN

## 2017-03-31 MED ORDER — FENTANYL CITRATE (PF) 250 MCG/5ML IJ SOLN
INTRAMUSCULAR | Status: AC
  Filled 2017-03-31: qty 5

## 2017-03-31 MED ORDER — ACETAMINOPHEN 160 MG/5ML PO SOLN: 325.0000 mg | ORAL | Status: DC | PRN

## 2017-03-31 MED ORDER — VANCOMYCIN HCL IN DEXTROSE 1-5 GM/200ML-% IV SOLN
1000.0000 mg | INTRAVENOUS | Status: DC
  Administered 2017-04-01: 1000 mg via INTRAVENOUS
  Filled 2017-03-31: qty 200

## 2017-03-31 MED ORDER — ONDANSETRON HCL 4 MG PO TABS: 4.0000 mg | ORAL_TABLET | Freq: Four times a day (QID) | ORAL | Status: DC | PRN

## 2017-03-31 MED ORDER — FENTANYL CITRATE (PF) 100 MCG/2ML IJ SOLN
50.0000 ug | Freq: Once | INTRAMUSCULAR | Status: AC
  Administered 2017-03-31: 50 ug via INTRAVENOUS

## 2017-03-31 MED ORDER — CEFAZOLIN SODIUM-DEXTROSE 2-3 GM-%(50ML) IV SOLR
INTRAVENOUS | Status: DC | PRN
  Administered 2017-03-31: 2 g via INTRAVENOUS

## 2017-03-31 MED ORDER — ONDANSETRON HCL 4 MG/2ML IJ SOLN
INTRAMUSCULAR | Status: DC | PRN
  Administered 2017-03-31: 4 mg via INTRAVENOUS

## 2017-03-31 MED ORDER — PROPOFOL 10 MG/ML IV BOLUS
INTRAVENOUS | Status: AC
  Filled 2017-03-31: qty 20

## 2017-03-31 MED ORDER — MIDAZOLAM HCL 2 MG/2ML IJ SOLN
INTRAMUSCULAR | Status: AC
  Administered 2017-03-31: 1 mg via INTRAVENOUS
  Filled 2017-03-31: qty 2

## 2017-03-31 MED ORDER — 0.9 % SODIUM CHLORIDE (POUR BTL) OPTIME
TOPICAL | Status: DC | PRN
  Administered 2017-03-31: 1000 mL

## 2017-03-31 MED ORDER — ONDANSETRON HCL 4 MG/2ML IJ SOLN: 4.0000 mg | Freq: Once | INTRAMUSCULAR | Status: DC | PRN

## 2017-03-31 MED ORDER — MEPERIDINE HCL 50 MG/ML IJ SOLN: 6.2500 mg | INTRAMUSCULAR | Status: DC | PRN

## 2017-03-31 MED ORDER — MAGNESIUM CITRATE PO SOLN: 1.0000 | Freq: Once | ORAL | Status: DC | PRN

## 2017-03-31 MED ORDER — METHOCARBAMOL 1000 MG/10ML IJ SOLN
500.0000 mg | Freq: Four times a day (QID) | INTRAVENOUS | Status: DC | PRN
  Filled 2017-03-31: qty 5

## 2017-03-31 MED ORDER — SODIUM CHLORIDE 0.9 % IV SOLN
INTRAVENOUS | Status: DC
  Administered 2017-03-31: 14:00:00 via INTRAVENOUS

## 2017-03-31 SURGICAL SUPPLY — 31 items
BLADE SAW SGTL MED 73X18.5 STR (BLADE) IMPLANT
BLADE SURG 21 STRL SS (BLADE) ×3 IMPLANT
BNDG COHESIVE 4X5 TAN STRL (GAUZE/BANDAGES/DRESSINGS) ×3 IMPLANT
BNDG GAUZE ELAST 4 BULKY (GAUZE/BANDAGES/DRESSINGS) ×5 IMPLANT
COVER SURGICAL LIGHT HANDLE (MISCELLANEOUS) ×6 IMPLANT
DRAPE U-SHAPE 47X51 STRL (DRAPES) ×6 IMPLANT
DRSG ADAPTIC 3X8 NADH LF (GAUZE/BANDAGES/DRESSINGS) ×3 IMPLANT
DRSG PAD ABDOMINAL 8X10 ST (GAUZE/BANDAGES/DRESSINGS) ×6 IMPLANT
DURAPREP 26ML APPLICATOR (WOUND CARE) ×3 IMPLANT
ELECT CAUTERY BLADE 6.4 (BLADE) ×2 IMPLANT
ELECT REM PT RETURN 9FT ADLT (ELECTROSURGICAL) ×3
ELECTRODE REM PT RTRN 9FT ADLT (ELECTROSURGICAL) ×1 IMPLANT
GAUZE SPONGE 4X4 12PLY STRL (GAUZE/BANDAGES/DRESSINGS) ×3 IMPLANT
GLOVE BIOGEL PI IND STRL 9 (GLOVE) ×1 IMPLANT
GLOVE BIOGEL PI INDICATOR 9 (GLOVE) ×2
GLOVE SURG ORTHO 9.0 STRL STRW (GLOVE) ×3 IMPLANT
GOWN STRL REUS W/ TWL XL LVL3 (GOWN DISPOSABLE) ×2 IMPLANT
GOWN STRL REUS W/TWL XL LVL3 (GOWN DISPOSABLE) ×4
KIT BASIN OR (CUSTOM PROCEDURE TRAY) ×3 IMPLANT
KIT ROOM TURNOVER OR (KITS) ×3 IMPLANT
NS IRRIG 1000ML POUR BTL (IV SOLUTION) ×3 IMPLANT
PACK ORTHO EXTREMITY (CUSTOM PROCEDURE TRAY) ×3 IMPLANT
PAD ABD 8X10 STRL (GAUZE/BANDAGES/DRESSINGS) ×2 IMPLANT
PAD ARMBOARD 7.5X6 YLW CONV (MISCELLANEOUS) ×6 IMPLANT
SPONGE LAP 18X18 X RAY DECT (DISPOSABLE) ×2 IMPLANT
STOCKINETTE IMPERVIOUS LG (DRAPES) IMPLANT
SUT ETHILON 2 0 PSLX (SUTURE) ×3 IMPLANT
TOWEL OR 17X26 10 PK STRL BLUE (TOWEL DISPOSABLE) ×3 IMPLANT
TUBE CONNECTING 12'X1/4 (SUCTIONS) ×1
TUBE CONNECTING 12X1/4 (SUCTIONS) ×2 IMPLANT
YANKAUER SUCT BULB TIP NO VENT (SUCTIONS) ×3 IMPLANT

## 2017-03-31 NOTE — Anesthesia Preprocedure Evaluation (Signed)
Anesthesia Evaluation  Patient identified by MRN, date of birth, ID band Patient awake    Reviewed: Allergy & Precautions, NPO status , Patient's Chart, lab work & pertinent test results  Airway Mallampati: I       Dental no notable dental hx. (+) Teeth Intact   Pulmonary sleep apnea ,    Pulmonary exam normal breath sounds clear to auscultation       Cardiovascular hypertension, Pt. on medications and Pt. on home beta blockers Normal cardiovascular exam Rhythm:Regular Rate:Normal     Neuro/Psych negative neurological ROS  negative psych ROS   GI/Hepatic negative GI ROS, Neg liver ROS,   Endo/Other  diabetes, Poorly Controlled, Type 2, Insulin Dependent, Oral Hypoglycemic Agents  Renal/GU Renal InsufficiencyRenal disease  negative genitourinary   Musculoskeletal negative musculoskeletal ROS (+)   Abdominal (+) + obese,   Peds  Hematology  (+) Blood dyscrasia, anemia ,   Anesthesia Other Findings   Reproductive/Obstetrics                             Anesthesia Physical Anesthesia Plan  ASA: III  Anesthesia Plan: Regional and MAC   Post-op Pain Management:    Induction:   PONV Risk Score and Plan: 1 and Ondansetron  Airway Management Planned: Natural Airway and Nasal Cannula  Additional Equipment:   Intra-op Plan:   Post-operative Plan:   Informed Consent: I have reviewed the patients History and Physical, chart, labs and discussed the procedure including the risks, benefits and alternatives for the proposed anesthesia with the patient or authorized representative who has indicated his/her understanding and acceptance.     Plan Discussed with: CRNA and Surgeon  Anesthesia Plan Comments:         Anesthesia Quick Evaluation

## 2017-03-31 NOTE — Progress Notes (Signed)
Subjective: The medical team met with Trevor [name] this morning prior to his surgery. He reports poor sleep d/t not being able to turn off the tv overnight and due to his position in bed. He feels good otherwise and denies any apprehension to the upcoming surgery. We discussed the importance of blood sugar management following the procedure.   Objective: Vital signs in last 24 hours: Vitals:   03/31/17 1130 03/31/17 1135 03/31/17 1140 03/31/17 1145  BP: (!) 157/93 (!) 158/96 (!) 153/87 139/78  Pulse: 90 92 89 85  Resp: 13 15 12 12   Temp:      TempSrc:      SpO2: 97% 97% 96% 98%  Weight:      Height:       Weight change:   Intake/Output Summary (Last 24 hours) at 03/31/2017 1303 Last data filed at 03/31/2017 0650 Gross per 24 hour  Intake 4876.67 ml  Output 5000 ml  Net -123.33 ml   BP 139/78   Pulse 85   Temp 98.8 F (37.1 C)   Resp 12   Ht 6' 4"  (1.93 m)   Wt 296 lb 15.4 oz (134.7 kg)   SpO2 98%   BMI 36.15 kg/m   General Appearance:    Alert, cooperative, no distress, appears stated age  Back:     Symmetric, no curvature, ROM normal, no CVA tenderness  Lungs:     Clear to auscultation bilaterally, respirations unlabored  Chest wall:    No tenderness or deformity  Heart:    Regular rate and rhythm, S1 and S2 normal, no murmur, rub   or gallop  Abdomen:     Soft, non-tender, bowel sounds active all four quadrants,    no masses, no organomegaly  Extremities:   RLE edematous, warm, but well-perfused. Wrapped in bandage, still malodorous.  Neurologic:   AO x 3, no focal neurological deficits noted.    Lab Results: Recent Results  Basic metabolic panel     Status: Abnormal   Collection Time: 03/31/17  6:08 AM  Result Value Ref Range   Sodium 134 (L) 135 - 145 mmol/L   Potassium 3.1 (L) 3.5 - 5.1 mmol/L   Chloride 101 101 - 111 mmol/L   CO2 21 (L) 22 - 32 mmol/L   Glucose, Bld 220 (H) 65 - 99 mg/dL   BUN 33 (H) 6 - 20 mg/dL   Creatinine, Ser 1.62 (H) 0.61 - 1.24  mg/dL   Calcium 8.2 (L) 8.9 - 10.3 mg/dL   GFR calc non Af Amer 45 (L) >60 mL/min   GFR calc Af Amer 52 (L) >60 mL/min    Comment: (NOTE) The eGFR has been calculated using the CKD EPI equation. This calculation has not been validated in all clinical situations. eGFR's persistently <60 mL/min signify possible Chronic Kidney Disease.    Anion gap 12 5 - 15    Comment: Performed at [facility] [address] [place], [place] [address]  CBC     Status: Abnormal   Collection Time: 03/31/17  6:08 AM  Result Value Ref Range   WBC 16.3 (H) 4.0 - 10.5 K/uL   RBC 4.19 (L) 4.22 - 5.81 MIL/uL   Hemoglobin 11.0 (L) 13.0 - 17.0 g/dL   HCT 32.9 (L) 39.0 - 52.0 %   MCV 78.5 78.0 - 100.0 fL   MCH 26.3 26.0 - 34.0 pg   MCHC 33.4 30.0 - 36.0 g/dL   RDW 13.3 11.5 - 15.5 %  Platelets 461 (H) 150 - 400 K/uL    Comment: Performed at [facility], [place] [address] [place], [place] [address]  Glucose, capillary     Status: Abnormal   Collection Time: 03/31/17  6:29 AM  Result Value Ref Range   Glucose-Capillary 225 (H) 65 - 99 mg/dL  Glucose, capillary     Status: Abnormal   Collection Time: 03/31/17 10:14 AM  Result Value Ref Range   Glucose-Capillary 187 (H) 65 - 99 mg/dL    Micro Results: Recent Results (from the past 240 hour(s))  Blood Culture (routine x 2)     Status: None (Preliminary result)   Collection Time: 03/29/17  8:15 PM  Result Value Ref Range Status   Specimen Description BLOOD BLOOD RIGHT FOREARM  Final   Special Requests Blood Culture adequate volume IN PEDIATRIC BOTTLE  Final   Culture   Final    NO GROWTH < 24 HOURS Performed at [facility], [place] [address] [place], [place] [address]    Report Status PENDING  Incomplete  Blood Culture (routine x 2)     Status: None (Preliminary result)   Collection Time: 03/29/17  8:15 PM  Result Value Ref Range Status   Specimen Description BLOOD RIGHT ANTECUBITAL  Final   Special Requests   Final    Blood  Culture adequate volume BOTTLES DRAWN AEROBIC AND ANAEROBIC   Culture  Setup Time   Final    GRAM POSITIVE COCCI IN CHAINS AEROBIC BOTTLE ONLY CRITICAL RESULT CALLED TO, READ BACK BY AND VERIFIED WITH: A. MASTERS PHARMD, AT 2304 03/30/17 BY D. VANHOOK    Culture   Final    GRAM POSITIVE COCCI CULTURE REINCUBATED FOR BETTER GROWTH Performed at [facility], [place] [address], [place],  [address]    Report Status PENDING  Incomplete  Blood Culture ID Panel (Reflexed)     Status: Abnormal   Collection Time: 03/29/17  8:15 PM  Result Value Ref Range Status   Enterococcus species NOT DETECTED NOT DETECTED Final   Listeria monocytogenes NOT DETECTED NOT DETECTED Final   Staphylococcus species NOT DETECTED NOT DETECTED Final   Staphylococcus aureus NOT DETECTED NOT DETECTED Final   Streptococcus species DETECTED (A) NOT DETECTED Final    Comment: Not Enterococcus species, Streptococcus agalactiae, Streptococcus pyogenes, or Streptococcus pneumoniae. CRITICAL RESULT CALLED TO, READ BACK BY AND VERIFIED WITH: A. MASTERS PHARMD, AT 2304 03/30/17 BY D. VANHOOK    Streptococcus agalactiae NOT DETECTED NOT DETECTED Final   Streptococcus pneumoniae NOT DETECTED NOT DETECTED Final   Streptococcus pyogenes NOT DETECTED NOT DETECTED Final   Acinetobacter baumannii NOT DETECTED NOT DETECTED Final   Enterobacteriaceae species NOT DETECTED NOT DETECTED Final   Enterobacter cloacae complex NOT DETECTED NOT DETECTED Final   Escherichia coli NOT DETECTED NOT DETECTED Final   Klebsiella oxytoca NOT DETECTED NOT DETECTED Final   Klebsiella pneumoniae NOT DETECTED NOT DETECTED Final   Proteus species NOT DETECTED NOT DETECTED Final   Serratia marcescens NOT DETECTED NOT DETECTED Final   Haemophilus influenzae NOT DETECTED NOT DETECTED Final   Neisseria meningitidis NOT DETECTED NOT DETECTED Final   Pseudomonas aeruginosa NOT DETECTED NOT DETECTED Final   Candida albicans NOT DETECTED NOT DETECTED  Final   Candida glabrata NOT DETECTED NOT DETECTED Final   Candida krusei NOT DETECTED NOT DETECTED Final   Candida parapsilosis NOT DETECTED NOT DETECTED Final   Candida tropicalis NOT DETECTED NOT DETECTED Final    Comment:  Performed at [facility], [place] [address], [place], [place] [address]  Urine culture     Status: Abnormal   Collection Time: 03/29/17  9:52 PM  Result Value Ref Range Status   Specimen Description URINE, CLEAN CATCH  Final   Special Requests NONE  Final   Culture (A)  Final    <10,000 COLONIES/mL INSIGNIFICANT GROWTH Performed at [facility], [place] [address] [place], [place] [address]    Report Status 03/31/2017 FINAL  Final  Surgical pcr screen     Status: None   Collection Time: 03/30/17 12:22 PM  Result Value Ref Range Status   MRSA, PCR NEGATIVE NEGATIVE Final   Staphylococcus aureus NEGATIVE NEGATIVE Final    Comment: (NOTE) The Xpert SA Assay (FDA approved for NASAL specimens in patients 4 years of age and older), is one component of a comprehensive surveillance program. It is not intended to diagnose infection nor to guide or monitor treatment. Performed at [facility], [place] [address] [place], [place] [address]    Studies/Results: Dg Chest Port 1 View  Result Date: 03/29/2017 CLINICAL DATA:  Fever EXAM: PORTABLE CHEST 1 VIEW COMPARISON:  None. FINDINGS: Mildly low lung volumes with atelectasis at the left base. No focal consolidation. Heart size upper normal. No pneumothorax. IMPRESSION: Low lung volumes with minimal atelectasis at the left base. Electronically Signed   By: Donavan Foil M.D.   On: 03/29/2017 20:58   Dg Foot Complete Right  Result Date: 03/29/2017 CLINICAL DATA:  Patient with a wound on the plantar surface of the right foot for 3 weeks. Pain and swelling. EXAM: RIGHT FOOT COMPLETE - 3+ VIEW COMPARISON:  None. FINDINGS: Soft tissue wound is seen adjacent to the head of the first metatarsal. Soft tissues are  swollen and there is gas extending from the IP joint of the great toe to medial aspect of the talar neck. There is bony destructive change in the metatarsal head medially consistent with osteomyelitis. Bony destructive change is also seen in the base of the proximal phalanx of the great toe. No fracture is identified. IMPRESSION: Findings consistent with osteomyelitis in the head of the first metatarsal and base of the proximal phalanx of the great toe. Marked soft tissue swelling with extensive soft tissue gas extending from the IP joint of the great toe to the neck of the talus. Electronically Signed   By: Inge Rise M.D.   On: 03/29/2017 18:24   Medications: I have reviewed the patient's current medications. Scheduled Meds: . [MAR Hold] Influenza vac split quadrivalent PF  0.5 mL Intramuscular Tomorrow-1000  . [MAR Hold] insulin aspart  0-20 Units Subcutaneous TID WC  . [MAR Hold] insulin aspart  0-5 Units Subcutaneous QHS  . [MAR Hold] insulin aspart  6 Units Subcutaneous TID WC  . [MAR Hold] insulin detemir  26 Units Subcutaneous QHS  . [MAR Hold] polyethylene glycol  17 g Oral Daily  . [MAR Hold] senna-docusate  1 tablet Oral QHS   Continuous Infusions: .  ceFAZolin (ANCEF) IV    . [MAR Hold] cefTRIAXone (ROCEPHIN)  IV Stopped (03/30/17 1633)  . [MAR Hold] metronidazole Stopped (03/31/17 0721)  . [MAR Hold] vancomycin     PRN Meds:.0.9 % irrigation (POUR BTL), [MAR Hold] acetaminophen **OR** [MAR Hold] acetaminophen, [MAR Hold]  HYDROmorphone (DILAUDID) injection Assessment/Plan: Principal Problem:   Diabetic foot ulcer with osteomyelitis (HCC) Active Problems:   Essential hypertension   Hypokalemia   Uncontrolled type 2 diabetes  mellitus with hyperglycemia, with long-term current use of insulin (HCC)   AKI (acute kidney injury) ([place])   CKD stage 3 due to type 2 diabetes mellitus ([place])   Sepsis [facility])  Trevor [name] is a 62 y.o. Male w/ PMH of poorly controlled T2DM and HTN p/w  5-6 months of worsening R foot ulcer. Patient found to have osteomyelitis on imaging and started on broad-spectrum abx, currently on Ceftriaxone/Flagyl/Vancomycin. Ortho performing surgery today with plan for R foot 1st/2nd ray amputation.   #Gangrenous Foot with Osteomyelitis. Prior to surgery, patient on Vancomycin/Flagyl/Ceftriaxone. Afebrile. Elevated WBC this morning at 16.3, but down-trending from prior few days. Lactic acid levels stable at 1.9, stabilized. Ortho completing surgery today.  - Ortho consulted, appreciate assistance - Resume diet post-surgery - Monitor fevers - Continue Vancomycin, Ceftriaxone, Metronidazole - D/c Abx within 48 hours per patient stability - IV Dilaudid 1 mg q3hrs PRN for pain - F/u blood cultures  #Hypokalemia. Potassium at 3.1 today.  - PO Potassium 40 mEq x 2 - IV Potassium 10 mEq x 4 - BMP in AM  #T2DM. Current A1c at 12.5. Hyperglycemic on admission with CBG via [facility] at 554. Much improved on insulin regimen, BG at 225 this morning. BGs trending between 100 - 362.  - Levemir 26 units / daily - Novolog 6 units TIDAC, will titrate accordingly following re-initiation of regular diet - CBG monitoring  #AKI/CKD Stage 3. Most recent Creatinine stabilizing at 2.00, BUN at 44. Baseline creatinine unclear at this time.  - Continue IV LR 125 cc/hr - Repeat BMP in AM - Avoid nephrotoxic agents  #Sepsis. Blood cultures showed Streptococcus species in one of two blood draws. Patient's temperature has been stable throughout admission, most recent at 98.18F. No signs of bacteremia at this time. Will continue abx though due to concurrent osteomyelitis.  - Continue to monitor  - D/c abx 24-48 hrs after surgery  #Essential HTN. BP elevated recently at 149/69. Home regimen includes Losartan 100 mg /daily. Nifedipine 90 mg daily and Furosemide 40 mg daily PRN.  - Hold home Losartan 2/2 AKI - Hold home Nifedipine, restart if BP continues to be elevated  Dispo:  Approximately 2-3 days  This is a Careers information officer Note.  The care of the patient was discussed with Dr. Ronalee Red and the assessment and plan formulated with their assistance.  Please see their attached note for official documentation of the daily encounter.   LOS: 2 days   Toy Cookey, Medical Student 03/31/2017, 1:03 PM

## 2017-03-31 NOTE — Social Work (Signed)
RNCM and CSW met with patient at bedside along with niece, and friend as they had questions about medicaid. CSW advied that financial counseling would assist with the application process for medicaid and patient that financial counselor has met with him briefly. CSW validated and indicated that they will f/u with him. Pt then inquired of utility assistance. CSW referred patient to his local social service office in [place] for assistance. Pt confirmed that he has a Education officer, museum in the community. Niece also confirmed that they are following up for social security benefits.  CSW answered all questions.  CSW will sign off for now as social work intervention is no longer needed. Please consult [place] again if new need arises.  Elissa Hefty, LCSW Clinical Social Worker [phone]

## 2017-03-31 NOTE — Progress Notes (Addendum)
Subjective:  Trevor [name] was seen lying in bed this morning. He endorses pain in his right foot, but feels that the pain medication helps bring his pain down to a 2/10. Endorsed subjective chills earlier this morning, but improved after receiving a couple of warm blankets. Discussed plan for amputation today.  Objective:  Vital signs in last 24 hours: Vitals:   03/30/17 0408 03/30/17 1409 03/30/17 2030 03/31/17 0600  BP: 112/61 (!) 122/59 (!) 145/94 (!) 149/69  Pulse: 81 86 90 (!) 101  Resp: 18 18    Temp: 98.6 F (37 C) 97.9 F (36.6 C) 98.7 F (37.1 C) 98.8 F (37.1 C)  TempSrc: Oral Oral Oral   SpO2: 92% 92% 92% 92%  Weight:      Height:       GEN: Well-nourished. Alert and oriented. No acute distress.  RESP: Clear to auscultation bilaterally. No wheezes, rales, or rhonchi. No increased work of breathing. CV: Normal rate and regular rhythm. 2/6 systolic murmur in tricuspid area.  ABD: Soft. Non-tender. Non-distended. Normoactive bowel sounds. EXT: RLE edema. Warm and well perfused. Right foot erythematous and warm up to mid-shin. Foot wrapped in bandage. NEURO: Cranial nerves II-XII grossly intact. Able to lift all four extremities against gravity. No apparent audiovisual hallucinations. Speech fluent and appropriate. PSYCH: Patient is calm and pleasant. Appropriate affect. Well-groomed; speech is appropriate and on-subject.  Assessment/Plan:  Principal Problem:   Diabetic foot ulcer with osteomyelitis ([place]) Active Problems:   Essential hypertension   Hypokalemia   Uncontrolled type 2 diabetes mellitus with hyperglycemia, with long-term current use of insulin (HCC)   AKI (acute kidney injury) ([place])   CKD stage 3 due to type 2 diabetes mellitus ([place])   Sepsis [facility])  Trevor [name] is a 62yo male with PMH of poorly controlled T2DM and HTN who presented with 5 months of worsening R foot ulcer, found to have osteomyelitis. Initially on Vanc/zosyn, which has been changed to  vancomycin, ceftriaxone, and metronidazole. 1/2 BCx drawn on admission growing Strep species, likely contaminant. Will continue antibiotics and re-assess plan after surgery. Ortho has been consulted, plan for amputation today. AKI continuing to improve with fluids.  R gangrenous foot with osteomyelitis, s/p 1st and 2nd metatarsal amputation 3/22 Currently afebrile. WBC elevated at 16. Plan for amputation today, per Ortho. 1/2 BCx drawn on admission grew Strep species, likely contaminant. Will f/u on sensitivities and continue empiric antibiotics for now. 1st and 2nd metatarsal ray amputation today, will need transtibial amputation either Sat or Sun this weekend, per Ortho. - Ortho consulted; appreciate their assistance - Back to OR for transtibial amputation Sat or Sun, per Ortho - Resume diet after surgery - NPO at MN - Continue vancomycin, ceftriaxone, and metronidazole (3/20 -> ) - F/u surgical Cx 3/22 -> pending - Continue to follow BCx 3/20 - IV dilaudid 1mg  q3h PRN for pain - Monitor fever curve - CBC, BMP in AM  AKI, resolved CKD 3 Cr 2.8 on admission, continues to improve to 1.62 this AM with IVF (likely back to baseline of ~1.5-2.1). - Continue IV LR 161mL/hr - BMP in AM - Avoid nephrotoxic agents  Hypokalemia K 3.1 this AM - PO K 61mEq x2 - BMP and Mg in AM  T2DM, poorly controlled at baseline A1c 12.5. Hyperglycemic to 500s on admission, now improved after insulin. BG 187-291. - Started on levemir 26u QHS and Novolog 6u TID WC yesterday, will likely increase post-op when eating again - SSI-R TID WC +  QHS - CBG monitoring  HTN BP 149/69. Home regimen includes losartan 100mg  daily, nifedipine 90mg  daily, and lasix 40mg  daily PRN - Hold home losartan - Hold home nifedipine given normotension, can restart if elevated BP  Dispo: Anticipated discharge in approximately 2-3 day(s).  Colbert Ewing, MD 03/31/2017, 7:28 AM Pager: Mamie Nick [phone]

## 2017-03-31 NOTE — Transfer of Care (Signed)
Immediate Anesthesia Transfer of Care Note  Patient: Trevor [name]  Procedure(s) Performed: RIGHT FOOT 1ST AND 2ND RAY AMPUTATION (Right Foot)  Patient Location: PACU  Anesthesia Type:MAC combined with regional for post-op pain  Level of Consciousness: awake, alert  and oriented  Airway & Oxygen Therapy: Patient Spontanous Breathing and Patient connected to face mask oxygen  Post-op Assessment: Report given to RN and Post -op Vital signs reviewed and stable  Post vital signs: Reviewed and stable  Last Vitals:  Vitals Value Taken Time  BP 138/84 03/31/2017  1:29 PM  Temp    Pulse 82 03/31/2017  1:31 PM  Resp 18 03/31/2017  1:31 PM  SpO2 100 % 03/31/2017  1:31 PM  Vitals shown include unvalidated device data.  Last Pain:  Vitals:   03/31/17 1145  TempSrc:   PainSc: 0-No pain      Patients Stated Pain Goal: 3 (67/20/94 7096)  Complications: No apparent anesthesia complications

## 2017-03-31 NOTE — H&P (View-Only) (Signed)
Patient ID: Trevor [name], male   DOB: 12/23/1955, 62 y.o.   MRN: [id number] Plan for a right transtibial amputation tomorrow Saturday morning.  I have discussed this with the patient's niece and have reviewed this with the patient.  N.p.o. after midnight.  Patient had an abscess that extended proximal to the ankle.

## 2017-03-31 NOTE — Anesthesia Procedure Notes (Addendum)
Anesthesia Regional Block: Popliteal block   Pre-Anesthetic Checklist: ,, timeout performed, Correct Patient, Correct Site, Correct Laterality, Correct Procedure, Correct Position, site marked, Risks and benefits discussed,  Surgical consent,  Pre-op evaluation,  At surgeon's request and post-op pain management  Laterality: Right and Lower  Prep: chloraprep       Needles:  Injection technique: Single-shot  Needle Type: Echogenic Stimulator Needle     Needle Length: 10cm  Needle Gauge: 21   Needle insertion depth: 2.5 cm   Additional Needles:   Procedures:,,,, ultrasound used (permanent image in chart),,,,  Narrative:  Start time: 03/31/2017 11:35 AM End time: 03/31/2017 11:43 AM Injection made incrementally with aspirations every 5 mL.  Performed by: Personally  Anesthesiologist: Lyn Hollingshead, MD

## 2017-03-31 NOTE — Op Note (Addendum)
03/31/2017  1:25 PM  PATIENT:  Trevor [name]    PRE-OPERATIVE DIAGNOSIS:  Osteomyelitis , Abscess Right Foot  POST-OPERATIVE DIAGNOSIS:  Same  PROCEDURE:  RIGHT FOOT 1ST AND 2ND RAY AMPUTATION  SURGEON:  Newt Minion, MD  PHYSICIAN ASSISTANT:None ANESTHESIA:   General  PREOPERATIVE INDICATIONS:  Trevor [name] is a  62 y.o. male with a diagnosis of Osteomyelitis , Abscess Right Foot who failed conservative measures and elected for surgical management.    The risks benefits and alternatives were discussed with the patient preoperatively including but not limited to the risks of infection, bleeding, nerve injury, cardiopulmonary complications, the need for revision surgery, among others, and the patient was willing to proceed.  OPERATIVE IMPLANTS: None.  @ENCIMAGES @  OPERATIVE FINDINGS: Abscess extended proximal to the ankle.  OPERATIVE PROCEDURE: Patient was brought the operating room after undergoing a regional block.  After adequate levels of anesthesia were obtained patient's right lower extremity was prepped using DuraPrep draped into a sterile field a timeout was called.  A racquet incision was made along the medial column of the right foot to encompass the first and second toes.  Patient had a large abscess cultures were obtained and sent.  The first and second rays were resected.  The wound was irrigated electrocautery was used for hemostasis.  Dissection more proximal showed the abscess extended proximal to the ankle.  This was opened irrigated and debrided.  The wound was packed open with gauze and a dressing was applied.  Patient was taken the PACU in stable condition.  Patient will require a transtibial amputation.  We will plan for surgery this weekend either Saturday or Sunday.   DISCHARGE PLANNING:  Antibiotic duration: Continue IV antibiotics new cultures obtained.  Weightbearing: Nonweightbearing right lower extremity.  Pain medication: Continue current  medications.  Dressing care/ Wound VAC: Reinforce dressing as needed.  Ambulatory devices: Walker.  Discharge to: Plan for return to the operating room for transtibial amputation either Saturday or Sunday.  Follow-up: In the office 1 week post operative.

## 2017-03-31 NOTE — Anesthesia Postprocedure Evaluation (Signed)
Anesthesia Post Note  Patient: Trevor [name]  Procedure(s) Performed: RIGHT FOOT 1ST AND 2ND RAY AMPUTATION (Right Foot)     Patient location during evaluation: PACU Anesthesia Type: Regional and MAC Level of consciousness: awake and sedated Pain management: pain level controlled Vital Signs Assessment: post-procedure vital signs reviewed and stable Respiratory status: spontaneous breathing Cardiovascular status: stable Postop Assessment: no apparent nausea or vomiting Anesthetic complications: no    Last Vitals:  Vitals:   03/31/17 1408 03/31/17 1424  BP:  (!) 152/68  Pulse: 85 88  Resp: 15 16  Temp: (!) 36.1 C 36.7 C  SpO2: 96% 97%    Last Pain:  Vitals:   03/31/17 1424  TempSrc: Axillary  PainSc: 6    Pain Goal: Patients Stated Pain Goal: 3 (03/30/17 2149)               Trevor [name],JOHN Mateo Flow

## 2017-03-31 NOTE — Interval H&P Note (Signed)
History and Physical Interval Note:  03/31/2017 7:25 AM  Trevor [name]  has presented today for surgery, with the diagnosis of Osteomyelitis , Abscess Right Foot  The various methods of treatment have been discussed with the patient and family. After consideration of risks, benefits and other options for treatment, the patient has consented to  Procedure(s): RIGHT FOOT 1ST AND 2ND RAY AMPUTATION (Right) as a surgical intervention .  The patient's history has been reviewed, patient examined, no change in status, stable for surgery.  I have reviewed the patient's chart and labs.  Questions were answered to the patient's satisfaction.     Newt Minion

## 2017-03-31 NOTE — Discharge Summary (Signed)
Name: Trevor [name] MRN: [id number] DOB: [birth year] 62 y.o. PCP: [name], NP  Date of Admission: 03/29/2017  4:07 PM Date of Discharge: 04/04/2017 Attending Physician: Lucious Groves, DO  Discharge Diagnosis: 1. Strep sanguinis bacteremia secondary to 2. 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 Lactic acidosis 4. Type 2 diabetes 5. Hypokalemia 6. AKI 7. HTN 8. Acute blood loss anemia  Principal Problem:   Diabetic foot ulcer with osteomyelitis ([place]) Active Problems:   Essential hypertension   Hypokalemia   Uncontrolled type 2 diabetes mellitus with hyperglycemia, with long-term current use of insulin (HCC)   AKI (acute kidney injury) ([place])   CKD stage 3 due to type 2 diabetes mellitus (HCC)   Sepsis ([employer])   Streptococcal bacteremia   Type 2 diabetes mellitus with right diabetic foot ulcer (HCC)   Post-operative pain   Legally blind   Acute blood loss anemia   Discharge Medications: Allergies as of 04/04/2017      Reactions   Enalapril Cough      Medication List    STOP taking these medications   atenolol 100 MG tablet Commonly known as:  TENORMIN   furosemide 40 MG tablet Commonly known as:  LASIX   glipiZIDE 10 MG tablet Commonly known as:  GLUCOTROL   meloxicam 15 MG tablet Commonly known as:  MOBIC   potassium chloride SA 20 MEQ tablet Commonly known as:  K-DUR,KLOR-CON   silver sulfADIAZINE 1 % cream Commonly known as:  SILVADENE     TAKE these medications   ceFAZolin 2-4 GM/100ML-% IVPB Commonly known as:  ANCEF Inject 100 mLs (2 g total) into the vein every 6 (six) hours for 11 days.   cyclobenzaprine 10 MG tablet Commonly known as:  FLEXERIL Take 10 mg by mouth 3 (three) times daily as needed for muscle spasms.   fluticasone 50 MCG/ACT nasal spray Commonly known as:  FLONASE Place 2 sprays into both nostrils daily.   glucose blood test strip Commonly known as:  TRUE METRIX BLOOD GLUCOSE TEST Use as  instructed   guaiFENesin 600 MG 12 hr tablet Commonly known as:  MUCINEX Take 1 tablet (600 mg total) by mouth 2 (two) times daily as needed for cough or to loosen phlegm.   hydrochlorothiazide 12.5 MG capsule Commonly known as:  MICROZIDE Take 1 capsule (12.5 mg total) by mouth daily. Start taking on:  04/05/2017   insulin aspart 100 UNIT/ML injection Commonly known as:  novoLOG Inject 0-20 Units into the skin 3 (three) times daily with meals.   insulin aspart 100 UNIT/ML injection Commonly known as:  novoLOG Inject 0-5 Units into the skin at bedtime.   insulin aspart 100 UNIT/ML injection Commonly known as:  novoLOG Inject 10 Units into the skin 3 (three) times daily with meals.   insulin detemir 100 UNIT/ML injection Commonly known as:  LEVEMIR Inject 0.4 mLs (40 Units total) into the skin at bedtime. What changed:    how much to take  when to take this   losartan 25 MG tablet Commonly known as:  COZAAR Take 100 mg by mouth daily.   metFORMIN 1000 MG tablet Commonly known as:  GLUCOPHAGE Take 1,000 mg by mouth 2 (two) times daily.   NIFEdipine 90 MG 24 hr tablet Commonly known as:  PROCARDIA XL/ADALAT-CC Take 1 tablet (90 mg total) by mouth daily. Start taking on:  04/05/2017   oxyCODONE-acetaminophen 5-325 MG tablet Commonly known as:  PERCOCET/ROXICET Take 1-2  tablets by mouth every 6 (six) hours as needed for moderate pain or severe pain.   polyethylene glycol packet Commonly known as:  MIRALAX / GLYCOLAX Take 17 g by mouth daily as needed for mild constipation.   senna-docusate 8.6-50 MG tablet Commonly known as:  Senokot-S Take 1 tablet by mouth at bedtime.            Durable Medical Equipment  (From admission, onward)        Start     Ordered   04/03/17 1519  For home use only DME wheelchair cushion (seat and back)  Once     04/03/17 1518   04/03/17 1515  For home use only DME Negative pressure wound device  Once    Comments:  Praveena  wound vac, per Ortho Dr. Sharol Given  Question Answer Comment  Frequency of dressing change Other see comments   Length of need Other see comments   Dressing type Foam   Amount of suction Other(see comments)   Pressure application Intermittent pressure   Supplies 10 canisters and 15 dressings per month for duration of therapy      04/03/17 1515      Disposition and follow-up:   Trevor [name] was discharged from [facility] in Stable condition.  At the hospital follow up visit please address:  1.  Diabetes. Please ensure compliance with diabetes medications and adjust as needed. Discharged from hospital with metformin 1000mg  BID, levemir 40 units QHS, and novolog 10 units TID WC. Glipizide discontinued.  2.  HTN. Please manage BP - discharged with losartan 100mg  daily, HCTZ 12.5mg  daily, and nifedipine 90mg  daily. Can uptitrate HCTZ as needed.  3.  Viridans strep bacteremia s/p R BKA. Please check and ensure he is getting appropriate rehab/therapy for R BKA. PICC placed for IV cefazolin x2 weeks (3/23 -> 4/6). Please make sure he sees Orthopedics for f/u in 1 week.  4.  AKI. Creatinine 1.17 on discharge. He had a prior diagnosis of CKD 3 secondary to type 2 diabetes. Please re-check Cr to see whether he truly does have CKD.  5.  Acute blood loss anemia. Hb 9.7 prior to discharge (baseline ~13-14). Please re-check CBC.  6.  Cough. Patient complaining of chronic cough and phlegm during his stay. Gave Mucinex to see if it would help. Advised patient to continue following up with PCP to evaluate for etiologies of chronic cough.  7.  Labs / imaging needed at time of follow-up: BMP (Cr), BP, CBC  8.  Pending labs/ test needing follow-up: BCx 3/23 (NGTD x3d on discharge), Surgical Cx 3/22 (moderate viridans strep; holding for anaerobic growth on discharge)  Follow-up Appointments: Follow-up Information    Newt Minion, MD In 1 week.   Specialty:  Orthopedic  Surgery Contact information: [place] [address] [phone]        [facility] Follow up.   Specialty:  [place] Why:  A representative from [facility] will contact you to arrange date and time for Home Health Nurse to come out for IV antibiotics. Contact information: [address] [place] [address] [place] [facility] Course by problem list: Principal Problem:   Diabetic foot ulcer with osteomyelitis ([place]) Active Problems:   Essential hypertension   Hypokalemia   Uncontrolled type 2 diabetes mellitus with hyperglycemia, with long-term current use of insulin (HCC)   AKI (acute kidney injury) (  [place])   CKD stage 3 due to type 2 diabetes mellitus ([place])   Sepsis ([place])   Streptococcal bacteremia   Type 2 diabetes mellitus with right diabetic foot ulcer (HCC)   Post-operative pain   Legally blind   Acute blood loss anemia   1. 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 He had a leukocytosis and lactic acidosis. He was afebrile and HDS on admission. He had a bedside I&D performed by EDP on 3/20. He was started on empiric vancomycin and zosyn, which was changed to vancomycin, ceftriaxone, and flagyl the following day. Lactic acidosis resolved with IVF administration. Orthopedics was consulted and he had 1st and 2nd ray amputation performed on 3/22. During the procedure, it was noted that the abscess extended proximal to the ankle, and thus plans for more proximal amputation were needed. On 3/23, he underwent transtibial amputation. He did spike a fever to 101.8 on 3/22 PM, which resolved the following day and he continued to remain afebrile. One out of 2 blood culture bottles on admission grew Strep sanguinis, which was the same species that grew on surgical culture from 3/22. His antibiotics were  thus narrowed to IV cefazolin on 3/23. Due to bacteremia, this was discussed with on-call ID providers, who recommended continuing IV cefazolin for 2 weeks (3/23 -> 4/6). Leukocytosis improved with IV antibiotics. PICC line was placed to receive outpatient IV antibiotics. Repeat BCx 3/23 were NGTD x3d on discharge. PT/OT were consulted and recommended CIR. He was discharged to [facility] for continued rehab. He was discharged with plan for 1 week post-op follow up with Orthopedics, as well as plan for portable Praveena pump on R BKA x1 week, then can discontinue.  2. Type 2 diabetes Poorly controlled at baseline. Hemoglobin A1c 12.5. He was hyperglycemic to 500s on admission without evidence of DKA or HHS. His home regimen was levemir 60u BID, metformin 1000mg  BID, and glipizide 10mg  BID. He was managed on a much lower dose of insulin while inpatient, thus he likely was non-compliant with his home regimen. We did achieve better glycemic control while he was inpatient. He was discharged on metformin 1000mg  BID, levemir 40u QHS, and Novolog 10u TID. We emphasized the importance of good glycemic control to prevent further complications. Please continue to adjust anti-glycemics as an outpatient as needed.  3. Hypokalemia He was hypokalemic during his admission, which was repleted with PO and IV potassium.  4. AKI Cr 2.8 on admission. Prior to admission, he had a variable baseline of ~1.5-2.1 and had a diagnosis of CKD stage III. AKI was likely secondary to hypovolemia, as his Cr improved to 1.17 on discharge with administration of IVF. Recommend follow up on whether he has a true diagnosis of CKD.  5. HTN BP elevated throughout hospital stay. He was discharged on losartan 100mg  daily, nifedipine 90mg  daily, and started on HCTZ 12.5mg  daily. Please continue to uptitrate as needed.  6. Acute blood loss anemia Likely post-op. Hb 9.7 on discharge (baseline ~13-14). Please re-check CBC as needed.  Discharge Vitals:    BP (!) 175/110 (BP Location: Left Arm)   Pulse (!) 121   Temp 98.2 F (36.8 C) (Oral)   Resp 18   Ht 6\' 4"  (1.93 m)   Wt 296 lb 15.4 oz (134.7 kg)   SpO2 99%   BMI 36.15 kg/m   Pertinent Labs, Studies, and Procedures:  CBC Latest Ref  Rng & Units 04/02/2017 04/01/2017 03/31/2017  WBC 4.0 - 10.5 K/uL 8.1 14.8(H) 16.3(H)  Hemoglobin 13.0 - 17.0 g/dL 9.7(L) 10.6(L) 11.0(L)  Hematocrit 39.0 - 52.0 % 29.0(L) 32.1(L) 32.9(L)  Platelets 150 - 400 K/uL 431(H) 431(H) 461(H)   CMP Latest Ref Rng & Units 04/04/2017 04/03/2017 04/02/2017  Glucose 65 - 99 mg/dL 168(H) 220(H) 159(H)  BUN 6 - 20 mg/dL 21(H) 19 20  Creatinine 0.61 - 1.24 mg/dL 1.17 1.30(H) 1.44(H)  Sodium 135 - 145 mmol/L 138 136 135  Potassium 3.5 - 5.1 mmol/L 3.7 3.6 3.3(L)  Chloride 101 - 111 mmol/L 106 105 105  CO2 22 - 32 mmol/L 24 23 24   Calcium 8.9 - 10.3 mg/dL 8.0(L) 8.1(L) 8.0(L)   Lactic acid 4.67 -> 2.8 -> 1.9 UA with 500 glucose, large Hgb, rare bacteria, 0-5 squam epithelial cells HIV negative BCx 3/20 with 1/2 Viridans streptococcus BCx 3/23 NGTD x3d Surgical Cx 3/22 with moderate Viridans streptococcus  Right foot x-ray 03/29/2017 - Findings consistent with osteomyelitis in the head of the first metatarsal and base of the proximal phalanx of the great toe. - Marked soft tissue swelling with extensive soft tissue gas extending from the IP joint of the great toe to the neck of the talus.  CXR 03/29/2017 Low lung volumes with minimal atelectasis at the left base.  Discharge Instructions: Discharge Instructions    Call MD for:  redness, tenderness, or signs of infection (pain, swelling, redness, odor or green/yellow discharge around incision site)   Complete by:  As directed    Call MD for:  temperature >100.4   Complete by:  As directed    Diet - low sodium heart healthy   Complete by:  As directed    Discharge instructions   Complete by:  As directed    Trevor [name],  It was a pleasure to take care of  you while you were in the hospital. While you were here, we treated you for an infection of your foot that caused an infection in your blood. You required an amputation of your right foot and will continue IV antibiotics until April 6. You will follow up with the surgeons in 1 week. You will also continue to work on therapy at inpatient rehab.  To try to help prevent infections like this from happening again, it is very important to take care of your blood sugar and your blood pressure. It is important to follow up with your primary doctor so that they can help manage this. We have made a number of changes to your medicines. - Please START hydrochlorothiazide 12.5mg  once a day - Please CONTINUE losartan and nifedipine. - For your insulin, please take Levemir 40 units ONCE A DAY. Please use Novolog 10 units three times a day with meals.  - Please CONTINUE metformin 1000mg  twice a day. - Please STOP glipizide.  Please ask your primary doctor about your cough and phlegm. They will be able to help figure out why you are having a cough.  Please call your primary care doctor to schedule a hospital follow up appointment for as soon as you can after you leave the inpatient rehab center.   Increase activity slowly   Complete by:  As directed      Signed: Colbert Ewing, MD 04/04/2017, 2:04 PM   Pager: Mamie Nick [phone]

## 2017-03-31 NOTE — Progress Notes (Signed)
Patient ID: Trevor [name], male   DOB: 06/09/1955, 62 y.o.   MRN: [id number] Plan for a right transtibial amputation tomorrow Saturday morning.  I have discussed this with the patient's niece and have reviewed this with the patient.  N.p.o. after midnight.  Patient had an abscess that extended proximal to the ankle.

## 2017-04-01 ENCOUNTER — Inpatient Hospital Stay (HOSPITAL_COMMUNITY): Payer: Medicare Other | Admitting: Certified Registered"

## 2017-04-01 ENCOUNTER — Encounter (HOSPITAL_COMMUNITY)
Admission: EM | Disposition: A | Discharge status: Rehabilitation Facility | Payer: Self-pay | Source: Home / Self Care | Attending: Internal Medicine

## 2017-04-01 ENCOUNTER — Encounter (HOSPITAL_COMMUNITY): Payer: Self-pay | Admitting: Orthopedic Surgery

## 2017-04-01 DIAGNOSIS — R7881 Bacteremia: Secondary | ICD-10-CM | POA: Diagnosis present

## 2017-04-01 DIAGNOSIS — Z89511 Acquired absence of right leg below knee: Secondary | ICD-10-CM

## 2017-04-01 DIAGNOSIS — B955 Unspecified streptococcus as the cause of diseases classified elsewhere: Secondary | ICD-10-CM | POA: Diagnosis present

## 2017-04-01 DIAGNOSIS — E1169 Type 2 diabetes mellitus with other specified complication: Secondary | ICD-10-CM

## 2017-04-01 DIAGNOSIS — B954 Other streptococcus as the cause of diseases classified elsewhere: Secondary | ICD-10-CM

## 2017-04-01 DIAGNOSIS — L97509 Non-pressure chronic ulcer of other part of unspecified foot with unspecified severity: Secondary | ICD-10-CM

## 2017-04-01 HISTORY — PX: AMPUTATION: SHX166

## 2017-04-01 LAB — CBC
HCT: 32.1 % — ABNORMAL LOW (ref 39.0–52.0)
Hemoglobin: 10.6 g/dL — ABNORMAL LOW (ref 13.0–17.0)
MCH: 26.1 pg (ref 26.0–34.0)
MCHC: 33 g/dL (ref 30.0–36.0)
MCV: 79.1 fL (ref 78.0–100.0)
PLATELETS: 431 10*3/uL — AB (ref 150–400)
RBC: 4.06 MIL/uL — ABNORMAL LOW (ref 4.22–5.81)
RDW: 13.4 % (ref 11.5–15.5)
WBC: 14.8 10*3/uL — ABNORMAL HIGH (ref 4.0–10.5)

## 2017-04-01 LAB — GLUCOSE, CAPILLARY
GLUCOSE-CAPILLARY: 202 mg/dL — AB (ref 65–99)
GLUCOSE-CAPILLARY: 203 mg/dL — AB (ref 65–99)
GLUCOSE-CAPILLARY: 207 mg/dL — AB (ref 65–99)
Glucose-Capillary: 221 mg/dL — ABNORMAL HIGH (ref 65–99)
Glucose-Capillary: 165 mg/dL — ABNORMAL HIGH (ref 65–99)
Glucose-Capillary: 196 mg/dL — ABNORMAL HIGH (ref 65–99)
Glucose-Capillary: 267 mg/dL — ABNORMAL HIGH (ref 65–99)
Glucose-Capillary: 257 mg/dL — ABNORMAL HIGH (ref 65–99)

## 2017-04-01 LAB — BASIC METABOLIC PANEL
Anion gap: 11 (ref 5–15)
BUN: 21 mg/dL — AB (ref 6–20)
CO2: 22 mmol/L (ref 22–32)
CREATININE: 1.44 mg/dL — AB (ref 0.61–1.24)
Calcium: 7.9 mg/dL — ABNORMAL LOW (ref 8.9–10.3)
Chloride: 99 mmol/L — ABNORMAL LOW (ref 101–111)
GFR calc Af Amer: 60 mL/min — ABNORMAL LOW (ref >60)
GFR, EST NON AFRICAN AMERICAN: 51 mL/min — AB (ref >60)
GLUCOSE: 220 mg/dL — AB (ref 65–99)
Potassium: 3.5 mmol/L (ref 3.5–5.1)
SODIUM: 132 mmol/L — AB (ref 135–145)

## 2017-04-01 LAB — MAGNESIUM: MAGNESIUM: 1.7 mg/dL (ref 1.7–2.4)

## 2017-04-01 SURGERY — AMPUTATION BELOW KNEE
Anesthesia: General | Site: Leg Lower | Laterality: Right

## 2017-04-01 MED ORDER — HYDROMORPHONE HCL 1 MG/ML IJ SOLN
0.2500 mg | INTRAMUSCULAR | Status: DC | PRN
  Administered 2017-04-01 (×2): 0.5 mg via INTRAVENOUS

## 2017-04-01 MED ORDER — ONDANSETRON HCL 4 MG/2ML IJ SOLN
INTRAMUSCULAR | Status: AC
  Filled 2017-04-01: qty 2

## 2017-04-01 MED ORDER — ONDANSETRON HCL 4 MG PO TABS: 4.0000 mg | ORAL_TABLET | Freq: Four times a day (QID) | ORAL | Status: DC | PRN

## 2017-04-01 MED ORDER — DOCUSATE SODIUM 100 MG PO CAPS
100.0000 mg | ORAL_CAPSULE | Freq: Two times a day (BID) | ORAL | Status: DC
  Administered 2017-04-01 – 2017-04-04 (×6): 100 mg via ORAL
  Filled 2017-04-01 (×6): qty 1

## 2017-04-01 MED ORDER — MIDAZOLAM HCL 2 MG/2ML IJ SOLN
INTRAMUSCULAR | Status: DC | PRN
  Administered 2017-04-01: 2 mg via INTRAVENOUS

## 2017-04-01 MED ORDER — ONDANSETRON HCL 4 MG/2ML IJ SOLN
INTRAMUSCULAR | Status: DC | PRN
  Administered 2017-04-01: 4 mg via INTRAVENOUS

## 2017-04-01 MED ORDER — BISACODYL 10 MG RE SUPP: 10.0000 mg | Freq: Every day | RECTAL | Status: DC | PRN

## 2017-04-01 MED ORDER — SODIUM CHLORIDE 0.9 % IV SOLN
INTRAVENOUS | Status: DC
  Administered 2017-04-01: 08:00:00 via INTRAVENOUS

## 2017-04-01 MED ORDER — ONDANSETRON HCL 4 MG/2ML IJ SOLN: 4.0000 mg | Freq: Once | INTRAMUSCULAR | Status: DC | PRN

## 2017-04-01 MED ORDER — VANCOMYCIN HCL 500 MG IV SOLR
500.0000 mg | Freq: Once | INTRAVENOUS | Status: AC
  Administered 2017-04-01: 500 mg via INTRAVENOUS
  Filled 2017-04-01: qty 500

## 2017-04-01 MED ORDER — METHOCARBAMOL 1000 MG/10ML IJ SOLN
500.0000 mg | Freq: Four times a day (QID) | INTRAVENOUS | Status: DC | PRN
  Filled 2017-04-01: qty 5

## 2017-04-01 MED ORDER — VANCOMYCIN HCL 10 G IV SOLR: 1500.0000 mg | INTRAVENOUS | Status: DC

## 2017-04-01 MED ORDER — METOCLOPRAMIDE HCL 5 MG/ML IJ SOLN: 5.0000 mg | Freq: Three times a day (TID) | INTRAMUSCULAR | Status: DC | PRN

## 2017-04-01 MED ORDER — LIDOCAINE HCL (CARDIAC) 20 MG/ML IV SOLN
INTRAVENOUS | Status: AC
  Filled 2017-04-01: qty 5

## 2017-04-01 MED ORDER — PROPOFOL 10 MG/ML IV BOLUS
INTRAVENOUS | Status: AC
  Filled 2017-04-01: qty 20

## 2017-04-01 MED ORDER — SODIUM CHLORIDE 0.9 % IV SOLN
INTRAVENOUS | Status: DC
  Administered 2017-04-01: 12:00:00 via INTRAVENOUS

## 2017-04-01 MED ORDER — MAGNESIUM CITRATE PO SOLN: 1.0000 | Freq: Once | ORAL | Status: DC | PRN

## 2017-04-01 MED ORDER — CEFAZOLIN SODIUM-DEXTROSE 2-4 GM/100ML-% IV SOLN
2.0000 g | Freq: Three times a day (TID) | INTRAVENOUS | Status: DC
  Administered 2017-04-01 – 2017-04-04 (×8): 2 g via INTRAVENOUS
  Filled 2017-04-01 (×11): qty 100

## 2017-04-01 MED ORDER — METOCLOPRAMIDE HCL 5 MG PO TABS: 5.0000 mg | ORAL_TABLET | Freq: Three times a day (TID) | ORAL | Status: DC | PRN

## 2017-04-01 MED ORDER — METHOCARBAMOL 500 MG PO TABS
500.0000 mg | ORAL_TABLET | Freq: Four times a day (QID) | ORAL | Status: DC | PRN
  Administered 2017-04-02 – 2017-04-04 (×4): 500 mg via ORAL
  Filled 2017-04-01 (×4): qty 1

## 2017-04-01 MED ORDER — ONDANSETRON HCL 4 MG/2ML IJ SOLN: 4.0000 mg | Freq: Four times a day (QID) | INTRAMUSCULAR | Status: DC | PRN

## 2017-04-01 MED ORDER — FENTANYL CITRATE (PF) 250 MCG/5ML IJ SOLN
INTRAMUSCULAR | Status: DC | PRN
  Administered 2017-04-01: 100 ug via INTRAVENOUS

## 2017-04-01 MED ORDER — MEPERIDINE HCL 50 MG/ML IJ SOLN: 6.2500 mg | INTRAMUSCULAR | Status: DC | PRN

## 2017-04-01 MED ORDER — PROPOFOL 10 MG/ML IV BOLUS
INTRAVENOUS | Status: DC | PRN
  Administered 2017-04-01: 150 mg via INTRAVENOUS

## 2017-04-01 MED ORDER — HYDROMORPHONE HCL 1 MG/ML IJ SOLN
INTRAMUSCULAR | Status: AC
  Filled 2017-04-01: qty 1

## 2017-04-01 MED ORDER — LIDOCAINE 2% (20 MG/ML) 5 ML SYRINGE
INTRAMUSCULAR | Status: DC | PRN
  Administered 2017-04-01: 100 mg via INTRAVENOUS

## 2017-04-01 MED ORDER — POLYETHYLENE GLYCOL 3350 17 G PO PACK: 17.0000 g | PACK | Freq: Every day | ORAL | Status: DC | PRN

## 2017-04-01 MED ORDER — 0.9 % SODIUM CHLORIDE (POUR BTL) OPTIME
TOPICAL | Status: DC | PRN
  Administered 2017-04-01: 1000 mL

## 2017-04-01 MED ORDER — MIDAZOLAM HCL 2 MG/2ML IJ SOLN
INTRAMUSCULAR | Status: AC
  Filled 2017-04-01: qty 2

## 2017-04-01 MED ORDER — FENTANYL CITRATE (PF) 250 MCG/5ML IJ SOLN
INTRAMUSCULAR | Status: AC
Start: 2017-04-01 — End: 2017-04-01
  Filled 2017-04-01: qty 5

## 2017-04-01 SURGICAL SUPPLY — 35 items
BENZOIN TINCTURE PRP APPL 2/3 (GAUZE/BANDAGES/DRESSINGS) ×6 IMPLANT
BLADE SAW RECIP 87.9 MT (BLADE) ×3 IMPLANT
BLADE SURG 21 STRL SS (BLADE) ×3 IMPLANT
BNDG COHESIVE 6X5 TAN STRL LF (GAUZE/BANDAGES/DRESSINGS) ×6 IMPLANT
BNDG GAUZE ELAST 4 BULKY (GAUZE/BANDAGES/DRESSINGS) ×6 IMPLANT
CANISTER PREVENA PLUS 150 (CANNISTER) ×2 IMPLANT
COVER SURGICAL LIGHT HANDLE (MISCELLANEOUS) ×3 IMPLANT
CUFF TOURNIQUET SINGLE 34IN LL (TOURNIQUET CUFF) IMPLANT
CUFF TOURNIQUET SINGLE 44IN (TOURNIQUET CUFF) IMPLANT
DRAPE INCISE IOBAN 66X45 STRL (DRAPES) IMPLANT
DRAPE U-SHAPE 47X51 STRL (DRAPES) ×3 IMPLANT
DRESSING PREVENA PLUS CUSTOM (GAUZE/BANDAGES/DRESSINGS) ×1 IMPLANT
DRSG PREVENA PLUS CUSTOM (GAUZE/BANDAGES/DRESSINGS) ×3
ELECT REM PT RETURN 9FT ADLT (ELECTROSURGICAL) ×3
ELECTRODE REM PT RTRN 9FT ADLT (ELECTROSURGICAL) ×1 IMPLANT
GLOVE BIOGEL PI IND STRL 9 (GLOVE) ×1 IMPLANT
GLOVE BIOGEL PI INDICATOR 9 (GLOVE) ×2
GLOVE SURG ORTHO 9.0 STRL STRW (GLOVE) ×3 IMPLANT
GOWN STRL REUS W/ TWL XL LVL3 (GOWN DISPOSABLE) ×2 IMPLANT
GOWN STRL REUS W/TWL XL LVL3 (GOWN DISPOSABLE) ×4
KIT BASIN OR (CUSTOM PROCEDURE TRAY) ×3 IMPLANT
KIT DRSG PREVENA PLUS 7DAY 125 (MISCELLANEOUS) ×2 IMPLANT
KIT ROOM TURNOVER OR (KITS) ×3 IMPLANT
MANIFOLD NEPTUNE II (INSTRUMENTS) ×3 IMPLANT
NS IRRIG 1000ML POUR BTL (IV SOLUTION) ×3 IMPLANT
PACK ORTHO EXTREMITY (CUSTOM PROCEDURE TRAY) ×3 IMPLANT
PAD ARMBOARD 7.5X6 YLW CONV (MISCELLANEOUS) ×3 IMPLANT
SPONGE LAP 18X18 X RAY DECT (DISPOSABLE) IMPLANT
STAPLER VISISTAT 35W (STAPLE) IMPLANT
STOCKINETTE IMPERVIOUS LG (DRAPES) ×3 IMPLANT
SUT ETHILON 2 0 PSLX (SUTURE) ×2 IMPLANT
SUT SILK 2 0 (SUTURE) ×2
SUT SILK 2-0 18XBRD TIE 12 (SUTURE) ×1 IMPLANT
SUT VIC AB 1 CTX 27 (SUTURE) ×4 IMPLANT
TOWEL OR 17X26 10 PK STRL BLUE (TOWEL DISPOSABLE) ×3 IMPLANT

## 2017-04-01 NOTE — Progress Notes (Signed)
Pharmacy Antibiotic Note  Trevor [name] is a 62 y.o. male admitted on 03/29/2017 with bacteremia.  Pharmacy has been consulted for cefazolin dosing.  Blood culture from 3/20 and abscess from R foot growing viridans streptococcus. Has been on vancomycin, zosyn>CTX + metronidazole since 3/20. WBC has decreased from 23.8 to 14.8. Scr was 2.8 down to 1.44 (CrCl ~80, normCrCl ~50 mL/min). Underwent BKA today on 3/23.  Plan to change to cefazolin.   Plan: Cefazolin 2 g every 8 hours starting on 3/23 at 1700  Monitor renal fx, clinical pic, cx results  Height: 6\' 4"  (193 cm) Weight: 296 lb 15.4 oz (134.7 kg) IBW/kg (Calculated) : 86.8  Temp (24hrs), Avg:98.9 F (37.2 C), Min:97.6 F (36.4 C), Max:101.8 F (38.8 C)  Recent Labs  Lab 03/29/17 1638 03/29/17 1707 03/29/17 2100 03/30/17 0244 03/30/17 0603 03/30/17 1543 03/31/17 0608 04/01/17 0721  WBC 23.8*  --   --   --  21.6*  --  16.3* 14.8*  CREATININE 2.80*  --   --   --  2.06* 2.00* 1.62* 1.44*  LATICACIDVEN  --  3.21* 4.67* 2.8* 1.9  --   --   --     Estimated Creatinine Clearance: 81.8 mL/min (A) (by C-G formula based on SCr of 1.44 mg/dL (H)).    Allergies  Allergen Reactions  . Enalapril Cough    Antimicrobials this admission: Zosyn 3/20 >> 3/21 CTX 3/21 >> 3/23 Flagyl 3/21 >> 3/23 Vanc 3/20 >> 3/21, restarted 3/22 >>3/23 Cefazolin 3/23 >>  Dose adjustments this admission: N/A  Microbiology results: 3/20 BCx - 1 of 2 viridans strep (BCID Strep spp) 3/20 UCx - negative 3/21 surgical screen PCR - negative 3/22 right foot abscess (deep cx) - viridans strep  Thank you for allowing pharmacy to be a part of this patient's care.  Doylene Canard, PharmD Clinical Pharmacist  Pager: [phone] Clinical Phone for 04/01/2017 until 3:30pm: [phone] If after 3:30pm, please call main pharmacy at [phone] 04/01/2017 3:53 PM

## 2017-04-01 NOTE — Progress Notes (Signed)
   Subjective:  Patient seen post-op. He denies pain.  Objective:  Vital signs in last 24 hours: Vitals:   04/01/17 1030 04/01/17 1045 04/01/17 1100 04/01/17 1130  BP:  (!) 152/70  (!) 161/79  Pulse: 86 87 86 89  Resp: 11 12 12 15   Temp:   97.9 F (36.6 C) 97.6 F (36.4 C)  TempSrc:    Oral  SpO2: 97% 98% 98% 97%  Weight:      Height:       Constitutional: NAD CV: RRR, no M/R/G Resp: no increased work of breathing, CTAB Abd: soft, NDNT Ext: s/p R BKA  Assessment/Plan:  Principal Problem:   Diabetic foot ulcer with osteomyelitis (HCC) Active Problems:   Essential hypertension   Hypokalemia   Uncontrolled type 2 diabetes mellitus with hyperglycemia, with long-term current use of insulin (HCC)   AKI (acute kidney injury) ([place])   CKD stage 3 due to type 2 diabetes mellitus ([place])   Sepsis ([place])  Trevor [name] is a 62yo male with PMH of poorly controlled T2DM and HTN who presented with 5 months of worsening R foot ulcer, found to have osteomyelitis. Initially on Vanc/zosyn, which has been changed to vancomycin, ceftriaxone, and metronidazole. 1/2 BCx drawn at admission growing viridans strep, same as from intraoperative wound culture. He is s/p R BKA with Dr. Sharol Given 3/23. AKI continuing to improve with fluids.  R gangrenous foot with osteomyelitis s/p R BKA Viridans strep bacteremia Blood cultures positive 1/2 for strep sanguinis; surgical culture from 3/22 growing viridans strep - strep sanguinis confirmed on phone with micro lab. - Stop vancomycin, ceftriaxone, and metronidazole (3/20 ->3/23) - ancef x2 wks; will f/u susceptibilitiesfollow - IV dilaudid 1mg  q3h PRN for pain - Monitor fever curve - CBC, BMP in AM - CM consulted for HH abx - repeat BCx ordered; will need PICC placed if neg  AKI, resolved CKD 3 Cr 2.8 on admission, continues to improve to 1.4 this AM with IVF (likely back to baseline of ~1.5-2.1). - BMP in AM - Avoid nephrotoxic  agents  Hypokalemia Resolved - f/u AM Bmet  T2DM, poorly controlled at baseline A1c 12.5. Hyperglycemic to 500s on admission, now improved after insulin. BG 187-291. - On levemir 26u QHS and Novolog 6u TID WC - SSI-R TID WC + QHS - CBG monitoring  HTN BP 149/69. Home regimen includes losartan 100mg  daily, nifedipine 90mg  daily, and lasix 40mg  daily PRN - Hold home losartan - Hold home nifedipine given normotension, can restart if elevated BP  Dispo: Anticipated discharge in approximately 1-2 day(s).  Trevor Grieve, MD 04/01/2017, 2:44 PM Pager: Mamie Nick [phone]

## 2017-04-01 NOTE — Anesthesia Postprocedure Evaluation (Signed)
Anesthesia Post Note  Patient: Trevor [name]  Procedure(s) Performed: AMPUTATION BELOW KNEE (Right Leg Lower)     Patient location during evaluation: PACU Anesthesia Type: General Level of consciousness: awake and alert Pain management: pain level controlled Vital Signs Assessment: post-procedure vital signs reviewed and stable Respiratory status: spontaneous breathing, nonlabored ventilation, respiratory function stable and patient connected to nasal cannula oxygen Cardiovascular status: stable and blood pressure returned to baseline Postop Assessment: no apparent nausea or vomiting Anesthetic complications: no    Last Vitals:  Vitals:   04/01/17 1100 04/01/17 1130  BP:  (!) 161/79  Pulse: 86 89  Resp: 12 15  Temp: 36.6 C 36.4 C  SpO2: 98% 97%    Last Pain:  Vitals:   04/01/17 1130  TempSrc: Oral  PainSc: 2                  [name] DAVID

## 2017-04-01 NOTE — Transfer of Care (Signed)
Immediate Anesthesia Transfer of Care Note  Patient: Trevor [name]  Procedure(s) Performed: AMPUTATION BELOW KNEE (Right Leg Lower)  Patient Location: PACU  Anesthesia Type:General  Level of Consciousness: lethargic and responds to stimulation  Airway & Oxygen Therapy: Patient Spontanous Breathing and Patient connected to nasal cannula oxygen  Post-op Assessment: Report given to RN  Post vital signs: Reviewed and stable  Last Vitals:  Vitals Value Taken Time  BP    Temp    Pulse 95 04/01/2017  9:48 AM  Resp 12 04/01/2017  9:48 AM  SpO2 97 % 04/01/2017  9:48 AM  Vitals shown include unvalidated device data.  Last Pain:  Vitals:   04/01/17 0735  TempSrc:   PainSc: 3       Patients Stated Pain Goal: 3 (75/17/00 1749)  Complications: No apparent anesthesia complications

## 2017-04-01 NOTE — Progress Notes (Signed)
PT Cancellation Note  Patient Details Name: JASAIAH KARWOWSKI MRN: [id number] DOB: [birth year]   Cancelled Treatment:    Reason Eval/Treat Not Completed: Patient at procedure or test/unavailable. Pt in OR. Undergoing R BKA this AM. PT to re-attempt eval at later time/date.   Lorriane Shire 04/01/2017, 8:00 AM   Lorrin Goodell, PT  Office # [phone] Pager [phone]

## 2017-04-01 NOTE — Anesthesia Preprocedure Evaluation (Signed)
Anesthesia Evaluation  Patient identified by MRN, date of birth, ID band Patient awake    Reviewed: Allergy & Precautions, NPO status , Patient's Chart, lab work & pertinent test results  Airway Mallampati: I  TM Distance: >3 FB Neck ROM: Full    Dental   Pulmonary sleep apnea ,    Pulmonary exam normal        Cardiovascular hypertension, Pt. on medications Normal cardiovascular exam     Neuro/Psych    GI/Hepatic   Endo/Other  diabetes, Type 2, Insulin Dependent  Renal/GU Renal InsufficiencyRenal disease     Musculoskeletal   Abdominal   Peds  Hematology   Anesthesia Other Findings   Reproductive/Obstetrics                             Anesthesia Physical Anesthesia Plan  ASA: III  Anesthesia Plan: General   Post-op Pain Management:    Induction: Intravenous  PONV Risk Score and Plan: 2 and Ondansetron and Treatment may vary due to age or medical condition  Airway Management Planned: LMA  Additional Equipment:   Intra-op Plan:   Post-operative Plan: Extubation in OR  Informed Consent: I have reviewed the patients History and Physical, chart, labs and discussed the procedure including the risks, benefits and alternatives for the proposed anesthesia with the patient or authorized representative who has indicated his/her understanding and acceptance.     Plan Discussed with: CRNA and Surgeon  Anesthesia Plan Comments:         Anesthesia Quick Evaluation

## 2017-04-01 NOTE — Op Note (Signed)
   Date of Surgery: 04/01/2017  INDICATIONS: Mr. Trevor [name] is a 62 y.o.-year-old male who underwent attempted foot salvage intervention however patient had an abscess that extended proximal to his ankle.  The wound was packed open and patient presents at this time for definitive transtibial amputation.[name]  PREOPERATIVE DIAGNOSIS: Osteomyelitis abscess right foot.  POSTOPERATIVE DIAGNOSIS: Same.  PROCEDURE: Transtibial amputation Application of Prevena wound VAC  SURGEON: Sharol Given, M.D.  ANESTHESIA:  general  IV FLUIDS AND URINE: See anesthesia.  ESTIMATED BLOOD LOSS: Minimal mL.  COMPLICATIONS: None.  DESCRIPTION OF PROCEDURE: The patient was brought to the operating room and underwent a general anesthetic. After adequate levels of anesthesia were obtained patient's lower extremity was prepped using DuraPrep draped into a sterile field. A timeout was called. The foot was draped out of the sterile field with impervious stockinette. A transverse incision was made 11 cm distal to the tibial tubercle. This curved proximally and a large posterior flap was created. The tibia was transected 1 cm proximal to the skin incision. The fibula was transected just proximal to the tibial incision. The tibia was beveled anteriorly. A large posterior flap was created. The sciatic nerve was pulled cut and allowed to retract. The vascular bundles were suture ligated with 2-0 silk. The deep and superficial fascial layers were closed using #1 Vicryl. The skin was closed using staples and 2-0 nylon. The wound was covered with a Prevena wound VAC. There was a good suction fit. . Patient was extubated taken to the PACU in stable condition.   DISCHARGE PLANNING:  Antibiotic duration: Continue antibiotics for 24 hours postoperatively.  Weightbearing: Nonweightbearing on the right.  Pain medication: As needed.  Dressing care/ Wound VAC: Patient will be discharged with a Praveena portable wound VAC.  Discharge to:  Patient states he does not have insurance and does have family support should be able to discharge to home.  Follow-up: In the office 1 week post operative.  Meridee Score, MD [place] 9:49 AM

## 2017-04-01 NOTE — Interval H&P Note (Signed)
History and Physical Interval Note:  04/01/2017 8:18 AM  Trevor [name]  has presented today for surgery, with the diagnosis of ABCESS RIGHT FOOT  The various methods of treatment have been discussed with the patient and family. After consideration of risks, benefits and other options for treatment, the patient has consented to  Procedure(s): AMPUTATION BELOW KNEE (Right) as a surgical intervention .  The patient's history has been reviewed, patient examined, no change in status, stable for surgery.  I have reviewed the patient's chart and labs.  Questions were answered to the patient's satisfaction.     Newt Minion

## 2017-04-01 NOTE — Anesthesia Procedure Notes (Signed)
Procedure Name: LMA Insertion Date/Time: 04/01/2017 8:51 AM Performed by: Barrington Ellison, CRNA Pre-anesthesia Checklist: Patient identified, Emergency Drugs available, Suction available and Patient being monitored Patient Re-evaluated:Patient Re-evaluated prior to induction Oxygen Delivery Method: Circle System Utilized Preoxygenation: Pre-oxygenation with 100% oxygen Induction Type: IV induction Ventilation: Mask ventilation without difficulty LMA: LMA inserted LMA Size: 5.0 Number of attempts: 1 Placement Confirmation: positive ETCO2 Tube secured with: Tape Dental Injury: Teeth and Oropharynx as per pre-operative assessment

## 2017-04-01 NOTE — Plan of Care (Signed)
  Problem: Elimination: Goal: Will not experience complications related to bowel motility Outcome: Progressing   Problem: Safety: Goal: Ability to remain free from injury will improve Outcome: Progressing   Problem: Skin Integrity: Goal: Risk for impaired skin integrity will decrease Outcome: Progressing   Problem: Education: Goal: Knowledge of General Education information will improve Outcome: Progressing   Problem: Pain Managment: Goal: General experience of comfort will improve Outcome: Progressing

## 2017-04-02 ENCOUNTER — Encounter (HOSPITAL_COMMUNITY): Payer: Self-pay | Admitting: Orthopedic Surgery

## 2017-04-02 DIAGNOSIS — Z978 Presence of other specified devices: Secondary | ICD-10-CM

## 2017-04-02 LAB — BASIC METABOLIC PANEL
Anion gap: 6 (ref 5–15)
BUN: 20 mg/dL (ref 6–20)
CO2: 24 mmol/L (ref 22–32)
Calcium: 8 mg/dL — ABNORMAL LOW (ref 8.9–10.3)
Chloride: 105 mmol/L (ref 101–111)
Creatinine, Ser: 1.44 mg/dL — ABNORMAL HIGH (ref 0.61–1.24)
GFR calc Af Amer: 60 mL/min — ABNORMAL LOW (ref >60)
GFR calc non Af Amer: 51 mL/min — ABNORMAL LOW (ref >60)
Glucose, Bld: 159 mg/dL — ABNORMAL HIGH (ref 65–99)
Potassium: 3.3 mmol/L — ABNORMAL LOW (ref 3.5–5.1)
Sodium: 135 mmol/L (ref 135–145)

## 2017-04-02 LAB — CBC
HCT: 29 % — ABNORMAL LOW (ref 39.0–52.0)
Hemoglobin: 9.7 g/dL — ABNORMAL LOW (ref 13.0–17.0)
MCH: 26.6 pg (ref 26.0–34.0)
MCHC: 33.4 g/dL (ref 30.0–36.0)
MCV: 79.5 fL (ref 78.0–100.0)
Platelets: 431 10*3/uL — ABNORMAL HIGH (ref 150–400)
RBC: 3.65 MIL/uL — ABNORMAL LOW (ref 4.22–5.81)
RDW: 13.9 % (ref 11.5–15.5)
WBC: 8.1 10*3/uL (ref 4.0–10.5)

## 2017-04-02 LAB — CULTURE, BLOOD (ROUTINE X 2): SPECIAL REQUESTS: ADEQUATE

## 2017-04-02 LAB — GLUCOSE, CAPILLARY
GLUCOSE-CAPILLARY: 247 mg/dL — AB (ref 65–99)
GLUCOSE-CAPILLARY: 276 mg/dL — AB (ref 65–99)
Glucose-Capillary: 178 mg/dL — ABNORMAL HIGH (ref 65–99)
Glucose-Capillary: 187 mg/dL — ABNORMAL HIGH (ref 65–99)

## 2017-04-02 MED ORDER — HYDRALAZINE HCL 20 MG/ML IJ SOLN
10.0000 mg | Freq: Once | INTRAMUSCULAR | Status: AC
  Administered 2017-04-02: 10 mg via INTRAVENOUS
  Filled 2017-04-02: qty 1

## 2017-04-02 MED ORDER — HYDRALAZINE HCL 20 MG/ML IJ SOLN: 10.0000 mg | INTRAMUSCULAR | Status: DC | PRN

## 2017-04-02 MED ORDER — ZOLPIDEM TARTRATE 5 MG PO TABS: 5.0000 mg | ORAL_TABLET | Freq: Every evening | ORAL | Status: DC | PRN

## 2017-04-02 MED ORDER — HYDROMORPHONE HCL 2 MG PO TABS
2.0000 mg | ORAL_TABLET | ORAL | Status: DC | PRN
Start: 2017-04-02 — End: 2017-04-04
  Administered 2017-04-02 – 2017-04-04 (×4): 2 mg via ORAL
  Filled 2017-04-02 (×4): qty 1

## 2017-04-02 MED ORDER — POTASSIUM CHLORIDE 20 MEQ PO PACK
40.0000 meq | PACK | Freq: Once | ORAL | Status: AC
  Administered 2017-04-02: 40 meq via ORAL
  Filled 2017-04-02: qty 2

## 2017-04-02 MED ORDER — POTASSIUM CHLORIDE CRYS ER 20 MEQ PO TBCR
40.0000 meq | EXTENDED_RELEASE_TABLET | Freq: Once | ORAL | Status: DC
  Filled 2017-04-02: qty 2

## 2017-04-02 MED ORDER — ENOXAPARIN SODIUM 40 MG/0.4ML ~~LOC~~ SOLN
40.0000 mg | SUBCUTANEOUS | Status: DC
  Administered 2017-04-02 – 2017-04-04 (×3): 40 mg via SUBCUTANEOUS
  Filled 2017-04-02 (×3): qty 0.4

## 2017-04-02 NOTE — Evaluation (Signed)
Physical Therapy Evaluation Patient Details Name: Trevor [name] MRN: [id number] DOB: May 05, 1955 Today's Date: 04/02/2017   History of Present Illness  Pt is a 62 year old male with a past medical history of poorly controlled type 2 diabetes, hypertension, CKD stage III, hyperlipidemia, and sleep apnea.  He presented for evaluation of right foot pain and ulcer.  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     Clinical Impression  Pt admitted with above diagnosis. Pt currently with functional limitations due to the deficits listed below (see PT Problem List). PTA pt was independent with all functional mobility. On eval, he required min guard assist bed mobility. Pt sat EOB x 3-5 minutes with good sitting balance to complete LE exercises. He declined OOB to chair due to pain. Pt will benefit from skilled PT to increase their independence and safety with mobility to allow discharge to the venue listed below.       Follow Up Recommendations CIR    Equipment Recommendations  (TBD)    Recommendations for Other Services       Precautions / Restrictions Precautions Precautions: Fall Restrictions RLE Weight Bearing: Non weight bearing      Mobility  Bed Mobility Overal bed mobility: Needs Assistance Bed Mobility: Supine to Sit;Sit to Supine     Supine to sit: Min guard;HOB elevated Sit to supine: Min guard;HOB elevated   General bed mobility comments: +rail, increased time and effort, verbal cues for sequencing   Transfers                 General transfer comment: unable to progress beyond EOB due to pain/pt refusal  Ambulation/Gait             General Gait Details: unable  Stairs            Wheelchair Mobility    Modified Rankin (Stroke Patients Only)       Balance Overall balance assessment: Needs assistance Sitting-balance support: No upper extremity supported;Feet unsupported Sitting balance-Leahy Scale:  Good Sitting balance - Comments: Pt sat EOB x 3-5 minutes for LE exercises.                                     Pertinent Vitals/Pain Pain Assessment: 0-10 Pain Score: 6  Pain Location: RLE Pain Descriptors / Indicators: Sore Pain Intervention(s): Limited activity within patient's tolerance;Monitored during session    Home Living Family/patient expects to be discharged to:: Private residence Living Arrangements: Other relatives Available Help at Discharge: Family;Available PRN/intermittently Type of Home: House Home Access: Ramped entrance     Home Layout: One level Home Equipment: None      Prior Function Level of Independence: Independent               Hand Dominance        Extremity/Trunk Assessment   Upper Extremity Assessment Upper Extremity Assessment: Overall WFL for tasks assessed    Lower Extremity Assessment Lower Extremity Assessment: RLE deficits/detail RLE Deficits / Details: s/p BKA with wound vac in place    Cervical / Trunk Assessment Cervical / Trunk Assessment: Normal  Communication   Communication: No difficulties  Cognition Arousal/Alertness: Awake/alert Behavior During Therapy: WFL for tasks assessed/performed Overall Cognitive Status: Within Functional Limits for tasks assessed  General Comments      Exercises Amputee Exercises Quad Sets: AROM;Both;10 reps;Supine Gluteal Sets: AROM;Both;10 reps;Supine Hip ABduction/ADduction: AROM;Right;5 reps;Seated Knee Flexion: AROM;Right;Left;5 reps;Seated Knee Extension: AROM;Right;Left;5 reps;Seated   Assessment/Plan    PT Assessment Patient needs continued PT services  PT Problem List Decreased mobility;Decreased knowledge of precautions;Decreased activity tolerance;Pain;Decreased balance;Decreased knowledge of use of DME       PT Treatment Interventions DME instruction;Therapeutic activities;Therapeutic  exercise;Gait training;Patient/family education;Balance training;Functional mobility training    PT Goals (Current goals can be found in the Care Plan section)  Acute Rehab PT Goals Patient Stated Goal: not stated PT Goal Formulation: With patient Time For Goal Achievement: 04/16/17 Potential to Achieve Goals: Good    Frequency Min 3X/week   Barriers to discharge        Co-evaluation               AM-PAC PT "6 Clicks" Daily Activity  Outcome Measure Difficulty turning over in bed (including adjusting bedclothes, sheets and blankets)?: A Little Difficulty moving from lying on back to sitting on the side of the bed? : A Little Difficulty sitting down on and standing up from a chair with arms (e.g., wheelchair, bedside commode, etc,.)?: Unable Help needed moving to and from a bed to chair (including a wheelchair)?: Total Help needed walking in hospital room?: Total Help needed climbing 3-5 steps with a railing? : Total 6 Click Score: 10    End of Session   Activity Tolerance: Patient limited by pain Patient left: in bed;with call bell/phone within reach Nurse Communication: Mobility status PT Visit Diagnosis: Other abnormalities of gait and mobility (R26.89);Difficulty in walking, not elsewhere classified (R26.2);Pain Pain - Right/Left: Right Pain - part of body: Leg    Time: 6122-4497 PT Time Calculation (min) (ACUTE ONLY): 17 min   Charges:   PT Evaluation $PT Eval Moderate Complexity: 1 Mod     PT G Codes:        Lorrin Goodell, PT  Office # [phone] Pager [phone]   Lorriane Shire 04/02/2017, 12:27 PM

## 2017-04-02 NOTE — Plan of Care (Signed)
  Problem: Nutrition: Goal: Adequate nutrition will be maintained Outcome: Progressing   Problem: Elimination: Goal: Will not experience complications related to bowel motility Outcome: Progressing   Problem: Safety: Goal: Ability to remain free from injury will improve Outcome: Progressing   Problem: Education: Goal: Knowledge of General Education information will improve Outcome: Progressing

## 2017-04-02 NOTE — Progress Notes (Signed)
Subjective: 1 Day Post-Op Procedure(s) (LRB): AMPUTATION BELOW KNEE (Right) Patient reports pain as moderate.    Objective: Vital signs in last 24 hours: Temp:  [97.6 F (36.4 C)-98.8 F (37.1 C)] 98.3 F (36.8 C) (03/24 0441) Pulse Rate:  [84-96] 86 (03/24 0441) Resp:  [11-19] 16 (03/23 1636) BP: (136-161)/(70-94) 149/84 (03/24 0441) SpO2:  [96 %-99 %] 98 % (03/24 0441)  Intake/Output from previous day: 03/23 0701 - 03/24 0700 In: 537.3 [I.V.:537.3] Out: 950 [Urine:700; Blood:250] Intake/Output this shift: No intake/output data recorded.  Recent Labs    03/31/17 0608 04/01/17 0721  HGB 11.0* 10.6*   Recent Labs    03/31/17 0608 04/01/17 0721  WBC 16.3* 14.8*  RBC 4.19* 4.06*  HCT 32.9* 32.1*  PLT 461* 431*   Recent Labs    03/31/17 0608 04/01/17 0721  NA 134* 132*  K 3.1* 3.5  CL 101 99*  CO2 21* 22  BUN 33* 21*  CREATININE 1.62* 1.44*  GLUCOSE 220* 220*  CALCIUM 8.2* 7.9*   No results for input(s): LABPT, INR in the last 72 hours.  ABD soft No cellulitis present Compartment soft  Assessment/Plan: 1 Day Post-Op Procedure(s) (LRB): AMPUTATION BELOW KNEE (Right) Will Rx dilaudid po for pain and ambien for sleep-comfortable  Garald Balding 04/02/2017, 6:54 AM

## 2017-04-02 NOTE — Progress Notes (Addendum)
Trevor [name] called stating social worker wanted him to sign a document and he is legally blind she was very upset I checked for signs on door and notified her it would not happen again, and spoke with her son.

## 2017-04-02 NOTE — Care Management Note (Addendum)
Case Management Note  Patient Details  Name: Trevor [name] MRN: [id number] Date of Birth: 04/10/1955  Subjective/Objective:    Pt presented from home for right foot ulcer and underwent BKA 04/01/17.  Pt lives with a teenage nephew at home and is uninsured.  Pt has walker and cane at home,and lives in a 1-story home with a ramp.        Discussed d/c plans with patient.  He states his first choice would be to go to [facility] and is open to other SNFs or CIR.  CIR recommended by PT.  Pt states his nephew's mother may be willing to help at home if no other arrangements can be made.        Pt states he is in the process of applying for disability and hopes that his lawyer will get it straightened out so that he has a payor for these services.         Action/Plan: Advised patient he may need 2 weeks of IV abx and CM will follow to assist with D/c planning.  CSW also consulted to assist with SNF placement if CIR unable to take patient.   Expected Discharge Date:  04/04/17               Expected Discharge Plan:  [place]  In-House Referral:  NA  Discharge planning Services  CM Consult  Post Acute Care Choice:    Choice offered to:     DME Arranged:    DME Agency:     HH Arranged:    HH Agency:     Status of Service:  In process, will continue to follow  If discussed at Long Length of Stay Meetings, dates discussed:    Additional Comments:  Claudie Leach, RN 04/02/2017, 5:31 PM

## 2017-04-02 NOTE — Progress Notes (Addendum)
   Subjective:  Trevor [name] was seen lying in bed comfortably this morning. He endorsed mild pain in his RLE that was relieved by his pain medication regimen. He denies subjective fevers or chills. Has not had a BM, advised him that he has Miralax PRN. Tolerating PO intake.  Objective:  Vital signs in last 24 hours: Vitals:   04/01/17 1636 04/01/17 2006 04/02/17 0026 04/02/17 0441  BP: (!) 149/85 (!) 143/91 (!) 158/94 (!) 149/84  Pulse: 90 84 87 86  Resp: 16     Temp: 98 F (36.7 C) 98.1 F (36.7 C) 98.8 F (37.1 C) 98.3 F (36.8 C)  TempSrc: Oral Oral Oral Oral  SpO2: 96% 99% 99% 98%  Weight:      Height:       GEN: lying comfortably in bed in NAD CV: NR & RR, no M/R/G RESP: no increased work of breathing, CTAB ABD: soft, non-tender, non-distended, normal BS EXT: s/p R BKA. Wound vac in place.  Assessment/Plan:  Principal Problem:   Diabetic foot ulcer with osteomyelitis ([place]) Active Problems:   Essential hypertension   Hypokalemia   Uncontrolled type 2 diabetes mellitus with hyperglycemia, with long-term current use of insulin ([facility])   AKI (acute kidney injury) ([place])   CKD stage 3 due to type 2 diabetes mellitus ([place])   Sepsis ([place])   Streptococcal bacteremia  Trevor [name] is a 62yo male with PMH of poorly controlled T2DM and HTN who presented with 5 months of worsening R foot ulcer, found to have osteomyelitis, s/p R BKA 3/23. 1/2 BCx drawn at admission growing viridans strep, same as from intraoperative wound culture. Empiric vancomycin, ceftriaxone, and metronidazole changed to cefazolin given culture results.  R gangrenous foot with osteomyelitis, s/p R BKA 3/23 Viridans strep bacteremia Blood cultures positive 1/2 for strep sanguinis; surgical culture from 3/22 growing viridans strep - strep sanguinis confirmed on phone with micro lab. No leukocytosis, afebrile, HDS. - Continue IV cefazolin x2 wks (3/23 -> 4/6) - BCx positive 1/2 for strep sanguinis, surgical  culture 3/22 with same species - F/u susceptibilities - PO dilaudid 2mg  q4h PRN for severe pain - IV dilaudid 1mg  q3h PRN for breakthrough pain - Monitor fever curve - CBC, BMP in AM - CM consulted for HH abx - F/u repeat BCx 3/23; will need PICC placed if neg - PT/OT eval - F/u with Ortho in 1 week  AKI, resolved CKD 3 Cr 2.8 on admission, stable at 1.44 this AM (likely back to baseline of ~1.5-2.1). - BMP in AM - Avoid nephrotoxic agents  Hypokalemia K 3.3 this AM - PO K 79mEq x1 - BMP in AM  T2DM, poorly controlled at baseline A1c 12.5. Hyperglycemic to 500s on admission, now improved after insulin. BG 159-267. - On levemir 26u QHS and Novolog 6u TID WC - SSI-R TID WC + QHS - CBG monitoring  HTN BP 149/84. Home regimen includes losartan 100mg  daily, nifedipine 90mg  daily, and lasix 40mg  daily PRN - Hold home losartan, can restart if BP continues to be elevated - Hold home nifedipine given normotension  Dispo: Anticipated discharge in approximately 2-3 day(s).  Colbert Ewing, MD 04/02/2017, 7:07 AM Pager: Mamie Nick [phone]

## 2017-04-03 ENCOUNTER — Inpatient Hospital Stay: Payer: Self-pay

## 2017-04-03 DIAGNOSIS — R000 Tachycardia, unspecified: Secondary | ICD-10-CM

## 2017-04-03 DIAGNOSIS — E1122 Type 2 diabetes mellitus with diabetic chronic kidney disease: Secondary | ICD-10-CM

## 2017-04-03 DIAGNOSIS — G8918 Other acute postprocedural pain: Secondary | ICD-10-CM

## 2017-04-03 DIAGNOSIS — R7881 Bacteremia: Secondary | ICD-10-CM

## 2017-04-03 DIAGNOSIS — D62 Acute posthemorrhagic anemia: Secondary | ICD-10-CM

## 2017-04-03 DIAGNOSIS — L97519 Non-pressure chronic ulcer of other part of right foot with unspecified severity: Secondary | ICD-10-CM

## 2017-04-03 DIAGNOSIS — K922 Gastrointestinal hemorrhage, unspecified: Secondary | ICD-10-CM | POA: Diagnosis not present

## 2017-04-03 DIAGNOSIS — E11621 Type 2 diabetes mellitus with foot ulcer: Secondary | ICD-10-CM | POA: Diagnosis present

## 2017-04-03 DIAGNOSIS — N183 Chronic kidney disease, stage 3 (moderate): Secondary | ICD-10-CM

## 2017-04-03 DIAGNOSIS — I10 Essential (primary) hypertension: Secondary | ICD-10-CM

## 2017-04-03 DIAGNOSIS — H548 Legal blindness, as defined in USA: Secondary | ICD-10-CM | POA: Diagnosis present

## 2017-04-03 LAB — GLUCOSE, CAPILLARY
GLUCOSE-CAPILLARY: 189 mg/dL — AB (ref 65–99)
GLUCOSE-CAPILLARY: 196 mg/dL — AB (ref 65–99)
GLUCOSE-CAPILLARY: 213 mg/dL — AB (ref 65–99)
Glucose-Capillary: 239 mg/dL — ABNORMAL HIGH (ref 65–99)
Glucose-Capillary: 242 mg/dL — ABNORMAL HIGH (ref 65–99)
Glucose-Capillary: 166 mg/dL — ABNORMAL HIGH (ref 65–99)

## 2017-04-03 LAB — BASIC METABOLIC PANEL
Anion gap: 8 (ref 5–15)
BUN: 19 mg/dL (ref 6–20)
CO2: 23 mmol/L (ref 22–32)
Calcium: 8.1 mg/dL — ABNORMAL LOW (ref 8.9–10.3)
Chloride: 105 mmol/L (ref 101–111)
Creatinine, Ser: 1.3 mg/dL — ABNORMAL HIGH (ref 0.61–1.24)
GFR calc Af Amer: >60 mL/min (ref >60)
GFR calc non Af Amer: 58 mL/min — ABNORMAL LOW (ref >60)
Glucose, Bld: 220 mg/dL — ABNORMAL HIGH (ref 65–99)
Potassium: 3.6 mmol/L (ref 3.5–5.1)
Sodium: 136 mmol/L (ref 135–145)

## 2017-04-03 LAB — CULTURE, BLOOD (ROUTINE X 2)
CULTURE: NO GROWTH
SPECIAL REQUESTS: ADEQUATE

## 2017-04-03 MED ORDER — INSULIN DETEMIR 100 UNIT/ML ~~LOC~~ SOLN
36.0000 [IU] | Freq: Every day | SUBCUTANEOUS | Status: DC
  Administered 2017-04-03: 36 [IU] via SUBCUTANEOUS
  Filled 2017-04-03: qty 0.36

## 2017-04-03 MED ORDER — NIFEDIPINE ER OSMOTIC RELEASE 90 MG PO TB24
90.0000 mg | ORAL_TABLET | Freq: Every day | ORAL | Status: DC
  Administered 2017-04-03 – 2017-04-04 (×2): 90 mg via ORAL
  Filled 2017-04-03 (×3): qty 1

## 2017-04-03 MED ORDER — LOSARTAN POTASSIUM 50 MG PO TABS
100.0000 mg | ORAL_TABLET | Freq: Every day | ORAL | Status: DC
  Administered 2017-04-03 – 2017-04-04 (×2): 100 mg via ORAL
  Filled 2017-04-03 (×3): qty 2

## 2017-04-03 MED ORDER — INSULIN ASPART 100 UNIT/ML ~~LOC~~ SOLN
8.0000 [IU] | Freq: Three times a day (TID) | SUBCUTANEOUS | Status: DC
  Administered 2017-04-03 – 2017-04-04 (×3): 8 [IU] via SUBCUTANEOUS

## 2017-04-03 NOTE — Progress Notes (Signed)
Spoke with patient's daughter who was in the room and she explained that she is the POA and would like for all updates regarding the patient for her to be called to discuss anything further regarding his care [phone].

## 2017-04-03 NOTE — Progress Notes (Signed)
Physical Therapy Note  Discussed case with RN, and reviewed most recent PT note;   Patient suffers from R BKA 04-01-17 due to R foot ulcer with sepsis and osteomyelitis which impairs their ability to perform daily activities like walking, bathing, dressing, transfers in the home.  A walker alone will not resolve the issues with performing activities of daily living. A wheelchair will allow patient to safely perform daily activities.  The patient can self propel in the home or has a caregiver who can provide assistance.    PT will continue to follow,   Roney Marion, [place] Pager [phone] Office [phone]

## 2017-04-03 NOTE — Progress Notes (Signed)
Inpatient Rehabilitation  Please see consult by Dr. Posey Pronto for full details.  I will plan to follow up with patient tomorrow, 3/26 in hopes of better pain control (patient's need to be transitioned to oral pain medications prior to an IP Rehab admission) and post acute rehab therapy needs.    Carmelia Roller., CCC/SLP Admission Coordinator  [facility]  Cell [phone]

## 2017-04-03 NOTE — Progress Notes (Signed)
Inpatient Diabetes Program Recommendations  AACE/ADA: New Consensus Statement on Inpatient Glycemic Control (2015)  Target Ranges:  Prepandial:   less than 140 mg/dL      [facility] postprandial:   less than 180 mg/dL (1-2 hours)      Critically ill patients:  140 - 180 mg/dL   Lab Results  Component Value Date   GLUCAP 239 (H) 04/03/2017   HGBA1C 12.5 03/29/2017    Results for [name], [name] (MRN [id number]) as of 04/03/2017 14:27  Ref. Range 04/02/2017 10:57 04/02/2017 16:54 04/03/2017 00:18 04/03/2017 06:38 04/03/2017 11:53  Glucose-Capillary Latest Ref Range: 65 - 99 mg/dL 247 (H) 276 (H) 196 (H) 213 (H) 239 (H)  Review of Glycemic Control  Diabetes history: Type 2 DM Outpatient Diabetes medications: Levemir 60 units bid, Glipizide 10 mg bid, Metformin 1000 mg bid Current orders for Inpatient glycemic control:  Novolog resistant tid with meals and HS, Levemir 26 units q HS, Novolog 8 units tid with meals  Inpatient Diabetes Program Recommendations:    Note upon d/c patient had not taken insulin for atleast 3 days.  Blood sugars remain elevated.  May consider increasing Levemir to 20 units bid (note this is much less than home dose).  Paged MD to discuss.  Thanks,  Adah Perl, RN, BC-ADM Inpatient Diabetes Coordinator Pager [phone] (8a-5p)

## 2017-04-03 NOTE — Clinical Social Work Note (Signed)
Clinical Social Work Assessment  Patient Details  Name: Trevor [name] MRN: [id number] Date of Birth: Feb 16, 1955  Date of referral:  04/03/17               Reason for consult:  Facility Placement                Permission sought to share information with:  Chartered certified accountant granted to share information::  Yes, Verbal Permission Granted  Name::     Marcine Matar  Agency::     Relationship::     Contact Information:     Housing/Transportation Living arrangements for the past 2 months:  [place] of Information:  Patient Patient Interpreter Needed:  None Criminal Activity/Legal Involvement Pertinent to Current Situation/Hospitalization:  No - Comment as needed Significant Relationships:  Other Family Members, Friend Lives with:  Relatives Do you feel safe going back to the place where you live?  No Need for family participation in patient care:  Yes (Comment)  Care giving concerns:  Pt has transtibial amputation and will need SNF placement. Pt agreeable to SNF.  Social Worker assessment / plan:  Pt resides with family, however given new impairment and health needs, will need short term rehab. Pt is legally blind. 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 CSW explained the Insurance barrier and patient indicated that he is following up for disability and has not received it yet. CSW obtained permission to speak with niece. CSW explained the CSW role, SNF process/placement. CSW advised that there will be barriers due to insurance. Pt desires [facility] as that is near home, but is amenable to [facility] as well.   CSW will f/u with Asst. Clin. Director on [place] for SNF.  Employment status:  Unemployed Forensic scientist:  Self Pay (Medicaid Pending) PT Recommendations:  [place] / Referral to community resources:  [place]  Patient/Family's  Response to care:  Pt appreciative of CSW meeting to discuss SNF options and is agreeable to SNF placement near home.  Patient/Family's Understanding of and Emotional Response to Diagnosis, Current Treatment, and Prognosis:  Patient has good understanding of his impairment and acknowledges that he needs SNF placement. Pt desires [place] as that is close to family and church. CSW will f/u and assist with LOG placement as patient is uninsured. No issues at this time. CSW will f/u for disposition.  Emotional Assessment Appearance:  Appears older than stated age Attitude/Demeanor/Rapport:  (Cooperative) Affect (typically observed):  Accepting, Appropriate Orientation:  Oriented to Situation, Oriented to  Time, Oriented to Place, Oriented to Self Alcohol / Substance use:  Not Applicable Psych involvement (Current and /or in the community):  No (Comment)  Discharge Needs  Concerns to be addressed:  Discharge Planning Concerns Readmission within the last 30 days:  No Current discharge risk:  Dependent with Mobility, Physical Impairment Barriers to Discharge:  No Barriers Identified   Normajean Baxter, LCSW 04/03/2017, 4:15 PM

## 2017-04-03 NOTE — Consult Note (Signed)
Physical Medicine and Rehabilitation Consult Reason for Consult: Decreased functional mobility Referring Physician: Internal medicine   HPI: Trevor [name] is a 62 y.o. right-handed male with history of diabetes mellitus, hypertension,legally blind. Per chart review and patient, patient lives with nephew in [place]. Nephew attends school and works. Family in area work. Presented 03/29/2017 with findings of osteomyelitis abscess of the right foot.  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 Sharol Given.  Hospital course pain management.  Acute blood loss anemia 9.7 and monitored.SQ lovenox for DVT prophylaxis.  Physical therapy evaluation completed with recommendations of physical medicine rehab consult.  Review of Systems  Constitutional: Negative for chills and fever.  HENT: Negative for hearing loss.   Eyes: Negative for blurred vision and double vision.       Legally blind  Respiratory: Negative for cough and shortness of breath.   Cardiovascular: Negative for chest pain and palpitations.  Gastrointestinal: Positive for constipation. Negative for nausea and vomiting.  Genitourinary: Negative for dysuria, flank pain and hematuria.  Musculoskeletal: Positive for joint pain and myalgias.  All other systems reviewed and are negative.  Past Medical History:  Diagnosis Date  . Diabetes mellitus without complication ([place])   . Heart murmur   . Hyperlipidemia   . Hypertension   . Sleep apnea    Past Surgical History:  Procedure Laterality Date  . AMPUTATION Right 03/31/2017   Procedure: RIGHT FOOT 1ST AND 2ND RAY AMPUTATION;  Surgeon: Newt Minion, MD;  Location: [facility];  Service: Orthopedics;  Laterality: Right;  . AMPUTATION Right 04/01/2017   Procedure: AMPUTATION BELOW KNEE;  Surgeon: Newt Minion, MD;  Location: [facility];  Service:  Orthopedics;  Laterality: Right;  . CORNEAL TRANSPLANT     No pertinent family history of amputation. Social History:  reports that he has never smoked. He has never used smokeless tobacco. He reports that he does not drink alcohol or use drugs. Allergies:  Allergies  Allergen Reactions  . Enalapril Cough   Medications Prior to Admission  Medication Sig Dispense Refill  . atenolol (TENORMIN) 100 MG tablet Take 100 mg by mouth daily.    . cyclobenzaprine (FLEXERIL) 10 MG tablet Take 10 mg by mouth 3 (three) times daily as needed for muscle spasms.    . fluticasone (FLONASE) 50 MCG/ACT nasal spray Place 2 sprays into both nostrils daily.    . furosemide (LASIX) 40 MG tablet Take 40 mg by mouth as needed.    [name] glipiZIDE (GLUCOTROL) 10 MG tablet Take 10 mg by mouth 2 (two) times daily.    . insulin detemir (LEVEMIR) 100 UNIT/ML injection Inject 60 Units into the skin 2 (two) times daily.    [name] losartan (COZAAR) 25 MG tablet Take 100 mg by mouth daily.    . meloxicam (MOBIC) 15 MG tablet Take 15 mg by mouth daily.    . metFORMIN (GLUCOPHAGE) 1000 MG tablet Take 1,000 mg by mouth 2 (two) times daily.    [name] NIFEdipine (PROCARDIA XL/ADALAT-CC) 90 MG 24 hr tablet Take 90 mg by mouth daily.    . potassium chloride SA (K-DUR,KLOR-CON) 20 MEQ tablet Take 1 tablet (20 mEq total) 2 (two) times daily by mouth. 60 tablet 3  . silver sulfADIAZINE (SILVADENE) 1 % cream Apply 1 application topically daily.    [name] glucose blood (TRUE METRIX BLOOD GLUCOSE TEST) test strip  Use as instructed (Patient not taking: Reported on 03/29/2017) 100 each 12    Home: [place] expects to be discharged to:: Private residence Living Arrangements: Other relatives Available Help at Discharge: Family, Available PRN/intermittently Type of Home: House Home Access: Ramped entrance [place]: One level Bathroom Shower/Tub: Chiropodist: Standard Home Equipment: None  Functional  History: Prior Function Level of Independence: Independent Functional Status:  Mobility: Bed Mobility Overal bed mobility: Needs Assistance Bed Mobility: Supine to Sit, Sit to Supine Supine to sit: Min guard, HOB elevated Sit to supine: Min guard, HOB elevated General bed mobility comments: +rail, increased time and effort, verbal cues for sequencing  Transfers General transfer comment: unable to progress beyond EOB due to pain/pt refusal Ambulation/Gait General Gait Details: unable    ADL:    Cognition: Cognition Overall Cognitive Status: Within Functional Limits for tasks assessed Orientation Level: Oriented X4 Cognition Arousal/Alertness: Awake/alert Behavior During Therapy: WFL for tasks assessed/performed Overall Cognitive Status: Within Functional Limits for tasks assessed  Blood pressure (!) 154/100, pulse 100, temperature 98.1 F (36.7 C), temperature source Oral, resp. rate 16, height 6\' 4"  (1.93 m), weight 134.7 kg (296 lb 15.4 oz), SpO2 98 %. Physical Exam  Vitals reviewed. Constitutional: He is oriented to person, place, and time. He appears well-developed.  Obese  HENT:  Head: Normocephalic and atraumatic.  Eyes: EOM are normal.  Pupils sluggish to light  Neck: Normal range of motion. Neck supple. No thyromegaly present.  Cardiovascular: Regular rhythm and normal heart sounds.  +Tachycardia  Respiratory: Effort normal and breath sounds normal. No respiratory distress.  GI: Soft. Bowel sounds are normal. He exhibits no distension.  Musculoskeletal:  Right stump TTP  Neurological: He is alert and oriented to person, place, and time.  Motor: B/l UE, LLE 5/5 proximal to distal RLE: HF 4/5 (pain inhibition)  Skin:  Right AKA with VAC and appropriately tender  Psychiatric: His affect is blunt.    Results for orders placed or performed during the hospital encounter of 03/29/17 (from the past 24 hour(s))  Glucose, capillary     Status: Abnormal    Collection Time: 04/02/17 10:57 AM  Result Value Ref Range   Glucose-Capillary 247 (H) 65 - 99 mg/dL  Glucose, capillary     Status: Abnormal   Collection Time: 04/02/17  4:54 PM  Result Value Ref Range   Glucose-Capillary 276 (H) 65 - 99 mg/dL  Glucose, capillary     Status: Abnormal   Collection Time: 04/03/17 12:18 AM  Result Value Ref Range   Glucose-Capillary 196 (H) 65 - 99 mg/dL  Glucose, capillary     Status: Abnormal   Collection Time: 04/03/17  6:38 AM  Result Value Ref Range   Glucose-Capillary 213 (H) 65 - 99 mg/dL  Basic metabolic panel     Status: Abnormal   Collection Time: 04/03/17  6:40 AM  Result Value Ref Range   Sodium 136 135 - 145 mmol/L   Potassium 3.6 3.5 - 5.1 mmol/L   Chloride 105 101 - 111 mmol/L   CO2 23 22 - 32 mmol/L   Glucose, Bld 220 (H) 65 - 99 mg/dL   BUN 19 6 - 20 mg/dL   Creatinine, Ser 1.30 (H) 0.61 - 1.24 mg/dL   Calcium 8.1 (L) 8.9 - 10.3 mg/dL   GFR calc non Af Amer 58 (L) >60 mL/min   GFR calc Af Amer >60 >60 mL/min   Anion gap 8 5 - 15  No results found.  Assessment/Plan: Diagnosis: Right BKA Labs independently reviewed.  Records reviewed and summated above. Clean amputation daily with soap and water Monitor incision site for signs of infection or impending skin breakdown. Staples to remain in place for 3-4 weeks Stump shrinker, for edema control  Scar mobilization massaging to prevent soft tissue adherence Stump protector during therapies Prevent flexion contractures by implementing the following:   Encourage prone lying for 20-30 mins per day BID to avoid hip flexion  Contractures if medically appropriate;  Avoid pillow under knees when patient is lying in bed in order to prevent both  knee and hip flexion contractures;  Avoid prolonged sitting Post surgical pain control with oral medication Phantom limb pain control with physical modalities including desensitization techniques (gentle self massage to the residual stump,hot  packs if sensation intact, [place]) and mirror therapy, TENS. If ineffective, consider pharmacological treatment for neuropathic pain (e.g gabapentin, pregabalin, amytriptalyine, duloxetine).  When using wheelchair, patient should have knee on amputated side fully extended with board under the seat cushion. Avoid injury to contralateral side  1. Does the need for close, 24 hr/day medical supervision in concert with the patient's rehab needs make it unreasonable for this patient to be served in a less intensive setting? Potentially  2. Co-Morbidities requiring supervision/potential complications: diabetes mellitus (Monitor in accordance with exercise and adjust meds as necessary), HTN (monitor and provide prns in accordance with increased physical exertion and pain), legally blind, post-op pain management (Biofeedback training with therapies to help reduce reliance on opiate pain medications, particularly IV dilaudid, monitor pain control during therapies, and sedation at rest and titrate to maximum efficacy to ensure participation and gains in therapies), Acute blood loss anemia (transfuse if necessary to ensure appropriate perfusion for increased activity tolerance), Tachycardia (monitor in accordance with pain and increasing, CKD (avoid nephrotoxic meds) 3. Due to safety, skin/wound care, disease management, pain management and patient education, does the patient require 24 hr/day rehab nursing? Potentially 4. Does the patient require coordinated care of a physician, rehab nurse, PT (1-2 hrs/day, 5 days/week) and OT (1-2 hrs/day, 5 days/week) to address physical and functional deficits in the context of the above medical diagnosis(es)? Potentially Addressing deficits in the following areas: balance, endurance, locomotion, strength, bathing, dressing, toileting and psychosocial support 5. Can the patient actively participate in an intensive therapy program of at least 3 hrs of therapy per day at least 5 days  per week? Yes 6. The potential for patient to make measurable gains while on inpatient rehab is good and fair 7. Anticipated functional outcomes upon discharge from inpatient rehab are modified independent  with PT, modified independent with OT, n/a with SLP. 8. Estimated rehab length of stay to reach the above functional goals is: 3-5 days. 9. Anticipated D/C setting: Home 10. Anticipated post D/C treatments: HH therapy and Home excercise program 11. Overall Rehab/Functional Prognosis: good  RECOMMENDATIONS: This patient's condition is appropriate for continued rehabilitative care in the following setting: Anticipate pt will not require CIR once pain better controlled.  Recommend home with HH. Further, pt would still like to consider if he wants CIR, but not today, as he prefers no one else speak to him today regarding rehab.   Patient has agreed to participate in recommended program. Potentially Note that insurance prior authorization may be required for reimbursement for recommended care.  Comment: Rehab Admissions Coordinator to follow up tomorrow.  Delice Lesch, MD, ABPMR Lavon Paganini Angiulli, PA-C 04/03/2017

## 2017-04-03 NOTE — Progress Notes (Signed)
   Subjective:  Trevor [name] was seen lying in bed comfortably this morning. He endorsed pain in his RLE that is relieved by his pain medication regimen. Denies subjective fevers or chills currently but did feel warm earlier. Tolerating PO intake. Amenable to discharge to inpatient rehab if accepted. Reports that he is in the process of filling out Medicaid application.  Objective:  Vital signs in last 24 hours: Vitals:   04/02/17 0441 04/02/17 1317 04/02/17 2004 04/03/17 0443  BP: (!) 149/84 (!) 151/94 (!) 194/100 (!) 154/100  Pulse: 86 84 97 100  Resp:  18 18 16   Temp: 98.3 F (36.8 C) 98 F (36.7 C) 98.3 F (36.8 C) 98.1 F (36.7 C)  TempSrc: Oral Oral Oral Oral  SpO2: 98% 97% 100% 98%  Weight:      Height:       GEN: lying comfortably in bed in NAD CV: NR & RR, no M/R/G RESP: no increased work of breathing, CTAB ABD: soft, non-tender, non-distended, normal BS EXT: s/p R BKA. Surgical site appears clean and dry with wound vac in place.  Assessment/Plan:  Principal Problem:   Diabetic foot ulcer with osteomyelitis ([place]) Active Problems:   Essential hypertension   Hypokalemia   Uncontrolled type 2 diabetes mellitus with hyperglycemia, with long-term current use of insulin ([facility])   AKI (acute kidney injury) ([place])   CKD stage 3 due to type 2 diabetes mellitus ([place])   Sepsis ([place])   Streptococcal bacteremia  Trevor [name] is a 62yo male with PMH of poorly controlled T2DM and HTN who presented with 5 months of worsening R foot ulcer, found to have osteomyelitis, s/p R BKA 3/23 with Viridans strep bacteremia. Empiric vancomycin, ceftriaxone, and metronidazole changed to cefazolin given culture results. Repeat BCx drawn 3/23 with NGTD x24h  Viridans strep bacteremia 2/2 R gangrenous foot with osteomyelitis, s/p R BKA 3/23 Blood cultures positive 1/2 for Strep sanguinis; surgical culture from 3/22 growing Strep sanguinis, sensitive to penicillin. No leukocytosis, afebrile, HDS.  Repeat BCx 3/23 NGTD x24h - Ortho consulted; appreciate their assistance - Continue IV cefazolin x2 wks (3/23 -> 4/6) - BCx positive 1/2 for strep sanguinis, surgical culture 3/22 with same species - PO dilaudid 2mg  q4h PRN for severe pain - IV dilaudid 1mg  q3h PRN for breakthrough pain - Restarted lovenox for DVT PPx 24h post-op - Monitor fever curve - CBC, BMP in AM - F/u repeat BCx 3/23 (NGTD x24h); will need PICC placed if neg - PT/OT eval -> CIR - F/u with Ortho outpatient in 1 week - CIR consulted  AKI, resolved CKD 3 Cr 2.8 on admission, stable at 1.30 this AM (likely back to baseline of ~1.5-2.1). - BMP in AM - Avoid nephrotoxic agents  Hypokalemia K 3.6 this AM - BMP in AM  T2DM, poorly controlled at baseline A1c 12.5. Hyperglycemic to 500s on admission, now improved after insulin. BG 196-276 - On levemir 26u QHS - Increase Novolog to 8u TID WC - SSI-R TID WC + QHS - CBG monitoring  HTN BP elevated to 194/100 overnight, given hydralazine PRN with improvement to 154/100. Home regimen includes losartan 100mg  daily, nifedipine 90mg  daily, and lasix 40mg  daily PRN - D/c PRN hydralazine - Restart home losartan 100mg  daily - Hold home nifedipine, can restart if elevated BP  Dispo: Anticipated discharge in approximately 2-3 day(s).  Colbert Ewing, MD 04/03/2017, 6:36 AM Pager: Mamie Nick [phone]

## 2017-04-03 NOTE — Social Work (Signed)
CSW obtained approval for LOG SNF bed.  [facility] and left message for admission staff and CSW sent to facilities in [employer] as well.  CSW f/u with SNF's on placement.  Elissa Hefty, LCSW Clinical Social Worker [phone]

## 2017-04-03 NOTE — NC FL2 (Signed)
[place] LEVEL OF CARE SCREENING TOOL     IDENTIFICATION  Patient Name: Trevor [name] Birthdate: 29-Mar-1955 Sex: male Admission Date (Current Location): 03/29/2017  [facility] and [place] Number:  [facility] [employer] and Address:  The [facility] [place] [address] [place], [place] [address]      Provider Number: [id number]  Attending Physician Name and Address:  Lucious Groves, DO  Relative Name and Phone Number:  Marcine Matar, neice, phone#    Current Level of Care: Hospital Recommended Level of Care: [place] Prior Approval Number:    Date Approved/Denied:   PASRR Number: [id number]  Discharge Plan: SNF    Current Diagnoses: Patient Active Problem List   Diagnosis Date Noted  . Type 2 diabetes mellitus with right diabetic foot ulcer ([place])   . Post-operative pain   . Legally blind   . Acute blood loss anemia   . Tachycardia   . Streptococcal bacteremia 04/01/2017  . Hypokalemia 03/30/2017  . Uncontrolled type 2 diabetes mellitus with hyperglycemia, with long-term current use of insulin ([place]) 03/30/2017  . Diabetic foot ulcer with osteomyelitis ([place]) 03/30/2017  . AKI (acute kidney injury) ([place]) 03/30/2017  . CKD stage 3 due to type 2 diabetes mellitus ([place]) 03/30/2017  . Sepsis ([place]) 03/30/2017  . Heart murmur 11/22/2016  . Hyperlipidemia associated with type 2 diabetes mellitus ([place]) 11/22/2016  . Obstructive sleep apnea syndrome 11/22/2016  . Essential hypertension 12/17/2012    Orientation RESPIRATION BLADDER Height & Weight     Self, Time, Situation, Place  Normal Continent Weight: 296 lb 15.4 oz (134.7 kg) Height:  6\' 4"  (193 cm)  BEHAVIORAL SYMPTOMS/MOOD NEUROLOGICAL BOWEL NUTRITION STATUS      Continent Diet(See DC Summary)  AMBULATORY STATUS COMMUNICATION OF NEEDS Skin   Extensive Assist Verbally Surgical wounds, Wound Vac                       Personal Care Assistance Level of Assistance   Dressing, Bathing, Feeding Bathing Assistance: Limited assistance Feeding assistance: Independent Dressing Assistance: Limited assistance     Functional Limitations Info  Sight, Hearing, Speech Sight Info: Impaired Hearing Info: Adequate Speech Info: Adequate    SPECIAL CARE FACTORS FREQUENCY  PT (By licensed PT), OT (By licensed OT)     PT Frequency: 5x week OT Frequency: 5x week            Contractures      Additional Factors Info  Code Status, Allergies, Insulin Sliding Scale Code Status Info: Full  Allergies Info: Enalapril   Insulin Sliding Scale Info: Insulin Daily       Current Medications (04/03/2017):  This is the current hospital active medication list Current Facility-Administered Medications  Medication Dose Route Frequency Provider Last Rate Last Dose  . 0.9 %  sodium chloride infusion   Intravenous Continuous Newt Minion, MD 10 mL/hr at 03/31/17 1424    . 0.9 %  sodium chloride infusion   Intravenous Continuous Newt Minion, MD 10 mL/hr at 04/01/17 [phone]    . 0.9 %  sodium chloride infusion   Intravenous Continuous Newt Minion, MD 10 mL/hr at 04/01/17 1216    . acetaminophen (TYLENOL) tablet 650 mg  650 mg Oral Q6H PRN Newt Minion, MD   650 mg at 04/03/17 1244   Or  . acetaminophen (TYLENOL) suppository 650 mg  650 mg Rectal Q6H PRN Newt Minion, MD      .  bisacodyl (DULCOLAX) suppository 10 mg  10 mg Rectal Daily PRN Newt Minion, MD      . ceFAZolin (ANCEF) IVPB 2g/100 mL premix  2 g Intravenous Q8H Oda Kilts, MD   Stopped at 04/03/17 1015  . docusate sodium (COLACE) capsule 100 mg  100 mg Oral BID Newt Minion, MD   100 mg at 04/03/17 0943  . enoxaparin (LOVENOX) injection 40 mg  40 mg Subcutaneous Q24H Colbert Ewing, MD   40 mg at 04/03/17 1239  . HYDROmorphone (DILAUDID) injection 1 mg  1 mg Intravenous Q3H PRN Newt Minion, MD   1 mg at 04/03/17 0734  . HYDROmorphone (DILAUDID) tablet 2 mg  2 mg Oral Q4H PRN Garald Balding, MD   2 mg at 04/02/17 2052  . insulin aspart (novoLOG) injection 0-20 Units  0-20 Units Subcutaneous TID WC Newt Minion, MD   7 Units at 04/03/17 1650  . insulin aspart (novoLOG) injection 0-5 Units  0-5 Units Subcutaneous QHS Newt Minion, MD   3 Units at 04/01/17 2230  . insulin aspart (novoLOG) injection 8 Units  8 Units Subcutaneous TID WC Colbert Ewing, MD   8 Units at 04/03/17 1650  . insulin detemir (LEVEMIR) injection 36 Units  36 Units Subcutaneous QHS Colbert Ewing, MD      . losartan (COZAAR) tablet 100 mg  100 mg Oral Daily Colbert Ewing, MD   100 mg at 04/03/17 1242  . magnesium citrate solution 1 Bottle  1 Bottle Oral Once PRN Newt Minion, MD      . methocarbamol (ROBAXIN) tablet 500 mg  500 mg Oral Q6H PRN Newt Minion, MD   500 mg at 04/03/17 1740   Or  . methocarbamol (ROBAXIN) 500 mg in dextrose 5 % 50 mL IVPB  500 mg Intravenous Q6H PRN Newt Minion, MD      . metoCLOPramide (REGLAN) tablet 5-10 mg  5-10 mg Oral Q8H PRN Newt Minion, MD       Or  . metoCLOPramide (REGLAN) injection 5-10 mg  5-10 mg Intravenous Q8H PRN Newt Minion, MD      . NIFEdipine (PROCARDIA XL/ADALAT-CC) 24 hr tablet 90 mg  90 mg Oral Daily Alphonzo Grieve, MD   90 mg at 04/03/17 1244  . ondansetron (ZOFRAN) tablet 4 mg  4 mg Oral Q6H PRN Newt Minion, MD       Or  . ondansetron [facility]) injection 4 mg  4 mg Intravenous Q6H PRN Newt Minion, MD      . polyethylene glycol (MIRALAX / GLYCOLAX) packet 17 g  17 g Oral Daily Newt Minion, MD   17 g at 04/02/17 0909  . polyethylene glycol (MIRALAX / GLYCOLAX) packet 17 g  17 g Oral Daily PRN Newt Minion, MD      . senna-docusate (Senokot-S) tablet 1 tablet  1 tablet Oral QHS Newt Minion, MD   1 tablet at 04/01/17 2203  . zolpidem (AMBIEN) tablet 5 mg  5 mg Oral QHS PRN Garald Balding, MD         Discharge Medications: Please see discharge summary for a list of discharge medications.  Relevant Imaging  Results:  Relevant Lab Results:   Additional Information SS#: [place], LCSW

## 2017-04-03 NOTE — Progress Notes (Signed)
Patient ID: Trevor [name], male   DOB: April 04, 1955, 62 y.o.   MRN: [id number] Patient is status post transtibial amputation on Saturday.  The wound VAC has no drainage.  From an orthopedic standpoint patient may discontinue the IV antibiotics.  Anticipate discharge with home health physical therapy.  Patient states he has no insurance.  Ideally skilled nursing discharge would be the best.

## 2017-04-03 NOTE — Progress Notes (Signed)
Spoke with [facility] Internal medicine doctor who stated patient is to receive IV antibiotic and will go home with PICC line.

## 2017-04-04 ENCOUNTER — Inpatient Hospital Stay (HOSPITAL_COMMUNITY)
Admission: RE | Admit: 2017-04-04 | Discharge: 2017-04-08 | DRG: 560 | Disposition: A | Discharge status: Other Acute Inpatient Hospital | Payer: Medicare Other | Source: Intra-hospital | Attending: Physical Medicine & Rehabilitation | Admitting: Physical Medicine & Rehabilitation

## 2017-04-04 ENCOUNTER — Encounter (HOSPITAL_COMMUNITY): Payer: Self-pay

## 2017-04-04 DIAGNOSIS — N183 Chronic kidney disease, stage 3 unspecified: Secondary | ICD-10-CM

## 2017-04-04 DIAGNOSIS — G4730 Sleep apnea, unspecified: Secondary | ICD-10-CM | POA: Diagnosis present

## 2017-04-04 DIAGNOSIS — K5900 Constipation, unspecified: Secondary | ICD-10-CM | POA: Diagnosis present

## 2017-04-04 DIAGNOSIS — I10 Essential (primary) hypertension: Secondary | ICD-10-CM | POA: Diagnosis not present

## 2017-04-04 DIAGNOSIS — E1122 Type 2 diabetes mellitus with diabetic chronic kidney disease: Secondary | ICD-10-CM | POA: Diagnosis present

## 2017-04-04 DIAGNOSIS — I129 Hypertensive chronic kidney disease with stage 1 through stage 4 chronic kidney disease, or unspecified chronic kidney disease: Secondary | ICD-10-CM | POA: Diagnosis present

## 2017-04-04 DIAGNOSIS — E1169 Type 2 diabetes mellitus with other specified complication: Secondary | ICD-10-CM | POA: Diagnosis not present

## 2017-04-04 DIAGNOSIS — Z8619 Personal history of other infectious and parasitic diseases: Secondary | ICD-10-CM | POA: Diagnosis not present

## 2017-04-04 DIAGNOSIS — E669 Obesity, unspecified: Secondary | ICD-10-CM | POA: Diagnosis present

## 2017-04-04 DIAGNOSIS — K5903 Drug induced constipation: Secondary | ICD-10-CM

## 2017-04-04 DIAGNOSIS — H5509 Other forms of nystagmus: Secondary | ICD-10-CM | POA: Diagnosis not present

## 2017-04-04 DIAGNOSIS — Z89511 Acquired absence of right leg below knee: Secondary | ICD-10-CM | POA: Diagnosis not present

## 2017-04-04 DIAGNOSIS — Z794 Long term (current) use of insulin: Secondary | ICD-10-CM | POA: Diagnosis not present

## 2017-04-04 DIAGNOSIS — Z23 Encounter for immunization: Secondary | ICD-10-CM

## 2017-04-04 DIAGNOSIS — B954 Other streptococcus as the cause of diseases classified elsewhere: Secondary | ICD-10-CM | POA: Diagnosis present

## 2017-04-04 DIAGNOSIS — E1142 Type 2 diabetes mellitus with diabetic polyneuropathy: Secondary | ICD-10-CM | POA: Diagnosis present

## 2017-04-04 DIAGNOSIS — E785 Hyperlipidemia, unspecified: Secondary | ICD-10-CM | POA: Diagnosis present

## 2017-04-04 DIAGNOSIS — E873 Alkalosis: Secondary | ICD-10-CM | POA: Diagnosis not present

## 2017-04-04 DIAGNOSIS — H548 Legal blindness, as defined in USA: Secondary | ICD-10-CM | POA: Diagnosis present

## 2017-04-04 DIAGNOSIS — Z888 Allergy status to other drugs, medicaments and biological substances status: Secondary | ICD-10-CM

## 2017-04-04 DIAGNOSIS — R7881 Bacteremia: Secondary | ICD-10-CM

## 2017-04-04 DIAGNOSIS — Z4781 Encounter for orthopedic aftercare following surgical amputation: Secondary | ICD-10-CM | POA: Diagnosis present

## 2017-04-04 DIAGNOSIS — Z947 Corneal transplant status: Secondary | ICD-10-CM | POA: Diagnosis not present

## 2017-04-04 DIAGNOSIS — I739 Peripheral vascular disease, unspecified: Secondary | ICD-10-CM

## 2017-04-04 DIAGNOSIS — G9349 Other encephalopathy: Secondary | ICD-10-CM | POA: Diagnosis not present

## 2017-04-04 DIAGNOSIS — R5383 Other fatigue: Secondary | ICD-10-CM

## 2017-04-04 DIAGNOSIS — D62 Acute posthemorrhagic anemia: Secondary | ICD-10-CM | POA: Diagnosis present

## 2017-04-04 DIAGNOSIS — R7309 Other abnormal glucose: Secondary | ICD-10-CM

## 2017-04-04 DIAGNOSIS — B955 Unspecified streptococcus as the cause of diseases classified elsewhere: Secondary | ICD-10-CM | POA: Diagnosis not present

## 2017-04-04 DIAGNOSIS — Z7984 Long term (current) use of oral hypoglycemic drugs: Secondary | ICD-10-CM | POA: Diagnosis not present

## 2017-04-04 DIAGNOSIS — Z6836 Body mass index (BMI) 36.0-36.9, adult: Secondary | ICD-10-CM | POA: Diagnosis not present

## 2017-04-04 DIAGNOSIS — Z9889 Other specified postprocedural states: Secondary | ICD-10-CM | POA: Diagnosis not present

## 2017-04-04 DIAGNOSIS — E119 Type 2 diabetes mellitus without complications: Secondary | ICD-10-CM | POA: Diagnosis not present

## 2017-04-04 DIAGNOSIS — Z791 Long term (current) use of non-steroidal anti-inflammatories (NSAID): Secondary | ICD-10-CM | POA: Diagnosis not present

## 2017-04-04 DIAGNOSIS — Z7951 Long term (current) use of inhaled steroids: Secondary | ICD-10-CM | POA: Diagnosis not present

## 2017-04-04 DIAGNOSIS — R079 Chest pain, unspecified: Secondary | ICD-10-CM | POA: Diagnosis not present

## 2017-04-04 DIAGNOSIS — Z89611 Acquired absence of right leg above knee: Secondary | ICD-10-CM

## 2017-04-04 DIAGNOSIS — D649 Anemia, unspecified: Secondary | ICD-10-CM | POA: Diagnosis not present

## 2017-04-04 DIAGNOSIS — R4182 Altered mental status, unspecified: Secondary | ICD-10-CM

## 2017-04-04 DIAGNOSIS — M869 Osteomyelitis, unspecified: Secondary | ICD-10-CM | POA: Diagnosis not present

## 2017-04-04 DIAGNOSIS — R011 Cardiac murmur, unspecified: Secondary | ICD-10-CM | POA: Diagnosis present

## 2017-04-04 DIAGNOSIS — G9340 Encephalopathy, unspecified: Secondary | ICD-10-CM | POA: Diagnosis not present

## 2017-04-04 DIAGNOSIS — R195 Other fecal abnormalities: Secondary | ICD-10-CM | POA: Diagnosis not present

## 2017-04-04 DIAGNOSIS — R0989 Other specified symptoms and signs involving the circulatory and respiratory systems: Secondary | ICD-10-CM

## 2017-04-04 DIAGNOSIS — Z79899 Other long term (current) drug therapy: Secondary | ICD-10-CM | POA: Diagnosis not present

## 2017-04-04 LAB — GLUCOSE, CAPILLARY
GLUCOSE-CAPILLARY: 291 mg/dL — AB (ref 65–99)
GLUCOSE-CAPILLARY: 169 mg/dL — AB (ref 65–99)
Glucose-Capillary: 242 mg/dL — ABNORMAL HIGH (ref 65–99)
Glucose-Capillary: 155 mg/dL — ABNORMAL HIGH (ref 65–99)

## 2017-04-04 LAB — BASIC METABOLIC PANEL
Anion gap: 8 (ref 5–15)
BUN: 21 mg/dL — ABNORMAL HIGH (ref 6–20)
CO2: 24 mmol/L (ref 22–32)
Calcium: 8 mg/dL — ABNORMAL LOW (ref 8.9–10.3)
Chloride: 106 mmol/L (ref 101–111)
Creatinine, Ser: 1.17 mg/dL (ref 0.61–1.24)
GFR calc Af Amer: >60 mL/min (ref >60)
GFR calc non Af Amer: >60 mL/min (ref >60)
Glucose, Bld: 168 mg/dL — ABNORMAL HIGH (ref 65–99)
Potassium: 3.7 mmol/L (ref 3.5–5.1)
Sodium: 138 mmol/L (ref 135–145)

## 2017-04-04 MED ORDER — INSULIN ASPART 100 UNIT/ML ~~LOC~~ SOLN: 10.0000 [IU] | Freq: Three times a day (TID) | SUBCUTANEOUS | 11 refills | Status: DC

## 2017-04-04 MED ORDER — SORBITOL 70 % SOLN: 30.0000 mL | Freq: Every day | Status: DC | PRN

## 2017-04-04 MED ORDER — ONDANSETRON 4 MG PO TBDP: 4.0000 mg | ORAL_TABLET | Freq: Three times a day (TID) | ORAL | Status: DC | PRN

## 2017-04-04 MED ORDER — METHOCARBAMOL 1000 MG/10ML IJ SOLN
500.0000 mg | Freq: Four times a day (QID) | INTRAVENOUS | Status: DC | PRN
  Filled 2017-04-04: qty 5

## 2017-04-04 MED ORDER — POLYETHYLENE GLYCOL 3350 17 G PO PACK: 17.0000 g | PACK | Freq: Every day | ORAL | 0 refills | Status: AC | PRN

## 2017-04-04 MED ORDER — INSULIN ASPART 100 UNIT/ML ~~LOC~~ SOLN
10.0000 [IU] | Freq: Three times a day (TID) | SUBCUTANEOUS | Status: DC
  Administered 2017-04-04: 10 [IU] via SUBCUTANEOUS

## 2017-04-04 MED ORDER — ONDANSETRON HCL 4 MG/2ML IJ SOLN: 4.0000 mg | Freq: Three times a day (TID) | INTRAMUSCULAR | Status: DC | PRN

## 2017-04-04 MED ORDER — FLEET ENEMA 7-19 GM/118ML RE ENEM: 1.0000 | ENEMA | Freq: Every day | RECTAL | Status: DC | PRN

## 2017-04-04 MED ORDER — OXYCODONE-ACETAMINOPHEN 5-325 MG PO TABS: 1.0000 | ORAL_TABLET | Freq: Four times a day (QID) | ORAL | 0 refills | Status: DC | PRN

## 2017-04-04 MED ORDER — ACETAMINOPHEN 650 MG RE SUPP: 650.0000 mg | Freq: Four times a day (QID) | RECTAL | Status: DC | PRN

## 2017-04-04 MED ORDER — TRAZODONE HCL 50 MG PO TABS: 50.0000 mg | ORAL_TABLET | Freq: Every evening | ORAL | Status: DC | PRN

## 2017-04-04 MED ORDER — SENNOSIDES-DOCUSATE SODIUM 8.6-50 MG PO TABS
1.0000 | ORAL_TABLET | Freq: Every day | ORAL | Status: DC
Start: 2017-04-04 — End: 2017-04-08
  Filled 2017-04-04 (×2): qty 1

## 2017-04-04 MED ORDER — DOCUSATE SODIUM 100 MG PO CAPS
100.0000 mg | ORAL_CAPSULE | Freq: Two times a day (BID) | ORAL | Status: DC
  Filled 2017-04-04 (×6): qty 1

## 2017-04-04 MED ORDER — LOSARTAN POTASSIUM 50 MG PO TABS
100.0000 mg | ORAL_TABLET | Freq: Every day | ORAL | Status: DC
  Administered 2017-04-05 – 2017-04-08 (×4): 100 mg via ORAL
  Filled 2017-04-04 (×4): qty 2

## 2017-04-04 MED ORDER — CEFAZOLIN SODIUM-DEXTROSE 2-4 GM/100ML-% IV SOLN
2.0000 g | Freq: Four times a day (QID) | INTRAVENOUS | Status: DC
  Administered 2017-04-04 (×2): 2 g via INTRAVENOUS
  Filled 2017-04-04 (×4): qty 100

## 2017-04-04 MED ORDER — NIFEDIPINE ER OSMOTIC RELEASE 90 MG PO TB24: 90.0000 mg | ORAL_TABLET | Freq: Every day | ORAL | 0 refills | Status: DC

## 2017-04-04 MED ORDER — ENOXAPARIN SODIUM 40 MG/0.4ML ~~LOC~~ SOLN
40.0000 mg | SUBCUTANEOUS | Status: DC
  Administered 2017-04-05: 40 mg via SUBCUTANEOUS
  Filled 2017-04-04: qty 0.4

## 2017-04-04 MED ORDER — GUAIFENESIN ER 600 MG PO TB12: 600.0000 mg | ORAL_TABLET | Freq: Two times a day (BID) | ORAL | 0 refills | Status: DC | PRN

## 2017-04-04 MED ORDER — GUAIFENESIN ER 600 MG PO TB12
600.0000 mg | ORAL_TABLET | Freq: Two times a day (BID) | ORAL | Status: DC
  Administered 2017-04-04: 600 mg via ORAL
  Filled 2017-04-04: qty 1

## 2017-04-04 MED ORDER — SENNOSIDES-DOCUSATE SODIUM 8.6-50 MG PO TABS: 2.0000 | ORAL_TABLET | Freq: Every evening | ORAL | Status: DC | PRN

## 2017-04-04 MED ORDER — POLYETHYLENE GLYCOL 3350 17 G PO PACK
17.0000 g | PACK | Freq: Every day | ORAL | Status: DC
  Administered 2017-04-05: 17 g via ORAL
  Filled 2017-04-04 (×3): qty 1

## 2017-04-04 MED ORDER — INSULIN DETEMIR 100 UNIT/ML ~~LOC~~ SOLN: 40.0000 [IU] | Freq: Every day | SUBCUTANEOUS | 11 refills | Status: DC

## 2017-04-04 MED ORDER — INSULIN ASPART 100 UNIT/ML ~~LOC~~ SOLN
10.0000 [IU] | Freq: Three times a day (TID) | SUBCUTANEOUS | Status: DC
  Administered 2017-04-05 – 2017-04-07 (×6): 10 [IU] via SUBCUTANEOUS

## 2017-04-04 MED ORDER — METHOCARBAMOL 500 MG PO TABS
500.0000 mg | ORAL_TABLET | Freq: Four times a day (QID) | ORAL | Status: DC | PRN
  Administered 2017-04-05 – 2017-04-07 (×6): 500 mg via ORAL
  Filled 2017-04-04 (×6): qty 1

## 2017-04-04 MED ORDER — NIFEDIPINE ER OSMOTIC RELEASE 90 MG PO TB24
90.0000 mg | ORAL_TABLET | Freq: Every day | ORAL | Status: DC
Start: 2017-04-05 — End: 2017-04-08
  Administered 2017-04-05 – 2017-04-08 (×4): 90 mg via ORAL
  Filled 2017-04-04 (×5): qty 1

## 2017-04-04 MED ORDER — HYDROCHLOROTHIAZIDE 12.5 MG PO CAPS: 12.5000 mg | ORAL_CAPSULE | Freq: Every day | ORAL | 0 refills | Status: DC

## 2017-04-04 MED ORDER — HYDROCHLOROTHIAZIDE 12.5 MG PO CAPS
12.5000 mg | ORAL_CAPSULE | Freq: Every day | ORAL | Status: DC
  Administered 2017-04-04: 12.5 mg via ORAL
  Filled 2017-04-04: qty 1

## 2017-04-04 MED ORDER — OXYCODONE-ACETAMINOPHEN 5-325 MG PO TABS
1.0000 | ORAL_TABLET | Freq: Four times a day (QID) | ORAL | Status: DC | PRN
  Administered 2017-04-05 – 2017-04-06 (×3): 1 via ORAL
  Administered 2017-04-06: 2 via ORAL
  Filled 2017-04-04: qty 2
  Filled 2017-04-04 (×2): qty 1
  Filled 2017-04-04: qty 2

## 2017-04-04 MED ORDER — ALUM & MAG HYDROXIDE-SIMETH 200-200-20 MG/5ML PO SUSP
30.0000 mL | ORAL | Status: DC | PRN
Start: 2017-04-04 — End: 2017-04-08

## 2017-04-04 MED ORDER — SENNOSIDES-DOCUSATE SODIUM 8.6-50 MG PO TABS: 1.0000 | ORAL_TABLET | Freq: Every day | ORAL | 0 refills | Status: DC

## 2017-04-04 MED ORDER — SODIUM CHLORIDE 0.9% FLUSH: 10.0000 mL | INTRAVENOUS | Status: DC | PRN

## 2017-04-04 MED ORDER — CEFAZOLIN SODIUM-DEXTROSE 2-4 GM/100ML-% IV SOLN: 2.0000 g | Freq: Four times a day (QID) | INTRAVENOUS | Status: DC

## 2017-04-04 MED ORDER — OXYCODONE-ACETAMINOPHEN 5-325 MG PO TABS
1.0000 | ORAL_TABLET | Freq: Four times a day (QID) | ORAL | Status: DC | PRN
  Administered 2017-04-04: 2 via ORAL
  Filled 2017-04-04: qty 2

## 2017-04-04 MED ORDER — INSULIN ASPART 100 UNIT/ML ~~LOC~~ SOLN
0.0000 [IU] | Freq: Three times a day (TID) | SUBCUTANEOUS | Status: DC
  Administered 2017-04-05: 4 [IU] via SUBCUTANEOUS
  Administered 2017-04-05: 7 [IU] via SUBCUTANEOUS
  Administered 2017-04-06: 15 [IU] via SUBCUTANEOUS
  Administered 2017-04-06 (×2): 3 [IU] via SUBCUTANEOUS
  Administered 2017-04-07: 4 [IU] via SUBCUTANEOUS
  Administered 2017-04-08: 3 [IU] via SUBCUTANEOUS

## 2017-04-04 MED ORDER — INSULIN DETEMIR 100 UNIT/ML ~~LOC~~ SOLN
40.0000 [IU] | Freq: Every day | SUBCUTANEOUS | Status: DC
  Administered 2017-04-04 – 2017-04-07 (×4): 40 [IU] via SUBCUTANEOUS
  Filled 2017-04-04 (×5): qty 0.4

## 2017-04-04 MED ORDER — INSULIN ASPART 100 UNIT/ML ~~LOC~~ SOLN: 0.0000 [IU] | Freq: Every day | SUBCUTANEOUS | 11 refills | Status: DC

## 2017-04-04 MED ORDER — INSULIN DETEMIR 100 UNIT/ML ~~LOC~~ SOLN
40.0000 [IU] | Freq: Every day | SUBCUTANEOUS | Status: DC
  Filled 2017-04-04: qty 0.4

## 2017-04-04 MED ORDER — POLYETHYLENE GLYCOL 3350 17 G PO PACK: 17.0000 g | PACK | Freq: Every day | ORAL | Status: DC | PRN

## 2017-04-04 MED ORDER — INSULIN ASPART 100 UNIT/ML ~~LOC~~ SOLN: 0.0000 [IU] | Freq: Three times a day (TID) | SUBCUTANEOUS | 11 refills | Status: DC

## 2017-04-04 MED ORDER — ACETAMINOPHEN 325 MG PO TABS: 650.0000 mg | ORAL_TABLET | Freq: Four times a day (QID) | ORAL | Status: DC | PRN

## 2017-04-04 MED ORDER — CEFAZOLIN SODIUM-DEXTROSE 2-4 GM/100ML-% IV SOLN
2.0000 g | Freq: Four times a day (QID) | INTRAVENOUS | Status: DC
  Administered 2017-04-05 – 2017-04-08 (×14): 2 g via INTRAVENOUS
  Filled 2017-04-04 (×16): qty 100

## 2017-04-04 MED ORDER — BISACODYL 10 MG RE SUPP: 10.0000 mg | Freq: Every day | RECTAL | Status: DC | PRN

## 2017-04-04 MED ORDER — ACETAMINOPHEN 325 MG PO TABS
650.0000 mg | ORAL_TABLET | Freq: Four times a day (QID) | ORAL | Status: DC | PRN
  Administered 2017-04-05 – 2017-04-07 (×3): 650 mg via ORAL
  Filled 2017-04-04 (×2): qty 2

## 2017-04-04 MED ORDER — HYDROMORPHONE HCL 1 MG/ML IJ SOLN: 1.0000 mg | Freq: Four times a day (QID) | INTRAMUSCULAR | Status: DC | PRN

## 2017-04-04 NOTE — Progress Notes (Signed)
Peripherally Inserted Central Catheter/Midline Placement  The IV Nurse has discussed with the patient and/or persons authorized to consent for the patient, the purpose of this procedure and the potential benefits and risks involved with this procedure.  The benefits include less needle sticks, lab draws from the catheter, and the patient may be discharged home with the catheter. Risks include, but not limited to, infection, bleeding, blood clot (thrombus formation), and puncture of an artery; nerve damage and irregular heartbeat and possibility to perform a PICC exchange if needed/ordered by physician.  Alternatives to this procedure were also discussed.  Bard Power PICC patient education guide, fact sheet on infection prevention and patient information card has been provided to patient /or left at bedside.    PICC/Midline Placement Documentation  PICC Single Lumen 20/94/70 PICC Right Basilic 48 cm 0 cm (Active)  Indication for Insertion or Continuance of Line Prolonged intravenous therapies 04/04/2017 11:00 AM  Exposed Catheter (cm) 0 cm 04/04/2017 11:00 AM  Site Assessment Dry;Clean;Intact 04/04/2017 11:00 AM  Line Status Flushed;Blood return noted 04/04/2017 11:00 AM  Dressing Type Transparent 04/04/2017 11:00 AM  Dressing Status Intact;Dry;Clean;Antimicrobial disc in place 04/04/2017 11:00 AM  Dressing Intervention New dressing 04/04/2017 11:00 AM  Dressing Change Due 04/11/17 04/04/2017 11:00 AM       Trevor [name] 04/04/2017, 11:35 AM

## 2017-04-04 NOTE — Progress Notes (Signed)
Subjective:  Trevor [name] was seen lying in bed comfortably this morning. He endorsed pain in his RLE that was a 5/10 in severity. He had a compression sleeve placed this morning and stated that was causing more pain in his RLE. Denies subjective fevers or chills. Tolerating PO intake. Continues to endorse phlegm that is chronic. Amenable to discharge to inpatient rehab if accepted. Discussed the importance of taking insulin as prescribed and the plan for IV antibiotics for 2 weeks. PICC to be placed today.  Objective:  Vital signs in last 24 hours: Vitals:   04/03/17 0443 04/03/17 1845 04/03/17 2102 04/04/17 0552  BP: (!) 154/100  (!) 152/87 (!) 153/86  Pulse: 100  98 (!) 103  Resp: 16  17 18   Temp: 98.1 F (36.7 C) 98.1 F (36.7 C) 98.7 F (37.1 C) 98.8 F (37.1 C)  TempSrc: Oral Oral Oral Oral  SpO2: 98%  100% 97%  Weight:      Height:       GEN: lying comfortably in bed in NAD CV: NR & RR, 2/6 systolic murmur, best heard at tricuspid area. No rubs/gallops RESP: no increased work of breathing on RA, CTAB ABD: soft, non-tender, non-distended, normal BS EXT: s/p R BKA. Surgical site appears clean and dry with compression sleeve in place.  Assessment/Plan:  Principal Problem:   Diabetic foot ulcer with osteomyelitis ([place]) Active Problems:   Essential hypertension   Hypokalemia   Uncontrolled type 2 diabetes mellitus with hyperglycemia, with long-term current use of insulin (HCC)   AKI (acute kidney injury) ([place])   CKD stage 3 due to type 2 diabetes mellitus ([place])   Sepsis ([place])   Streptococcal bacteremia   Type 2 diabetes mellitus with right diabetic foot ulcer ([place])   Post-operative pain   Legally blind   Acute blood loss anemia   Tachycardia  Trevor [name] is a 62yo male with PMH of poorly controlled T2DM and HTN who presented with 5 months of worsening R foot ulcer, found to have osteomyelitis, s/p R BKA 3/23 with Viridans strep bacteremia. Empiric vancomycin,  ceftriaxone, and metronidazole changed to cefazolin given culture results. Repeat BCx drawn 3/23 with NGTD x3d. Given patient's bacteremia, will need 2 weeks of IV cefazolin (3/23 -> 4/6). PICC line ordered.  Viridans strep bacteremia 2/2 R gangrenous foot with osteomyelitis, s/p R BKA 3/23 Blood cultures positive 1/2 for Strep sanguinis; surgical culture from 3/22 growing Strep sanguinis, sensitive to penicillin. No leukocytosis, afebrile, HDS. Repeat BCx 3/23 NGTD x3d. Given patient's bacteremia, have discussed case with ID and they agree with 2 weeks of IV cefazolin. PICC line has been ordered. - Ortho consulted; appreciate their assistance - Continue IV cefazolin x2 wks (3/23 -> 4/6) - BCx positive 1/2 for strep sanguinis, surgical culture 3/22 with same species - Changing pain regimen to PO oxy 5-10mg  q6h PRN for pain - Monitor fever curve - F/u repeat BCx 3/23 NGTD x3d - F/u surgical Cx 3/22 with moderate Viridans Streptococcus, holding for anaerobe - PICC line ordered - PT/OT eval -> CIR - CIR consulted - Wound VAC -> portable Praveena pump x1 week, then d/c - F/u with Ortho outpatient in 1 week  AKI, resolved Cr 2.8 on admission, has improved to normal at 1.17 this AM. Had a prior diagnosis of CKD, this may not be the case. Will continue to monitor. - BMP in AM  Hypokalemia, resolved K 3.7 this AM  T2DM, poorly controlled at baseline A1c 12.5. Hyperglycemic to 500s  on admission, now improved after insulin. BG better controlled with adjustment of his insulin regimen. BG [id number]. Will increase basal and bolus doses today. - On levemir 40u QHS - Increase Novolog to 10u TID WC - SSI-R TID WC + QHS - CBG monitoring - Discharge with metformin - Will discontinue sulfonylurea on discharge  HTN BP 153/86. Home regimen includes losartan 100mg  daily, nifedipine 90mg  daily, and lasix 40mg  daily PRN - Continue home losartan 100mg  daily - Continue home nifedipine 90mg  daily - Start  HCTZ 12.5mg  daily  Dispo: Anticipated discharge in approximately 0-1 day(s).  Trevor Ewing, MD 04/04/2017, 7:26 AM Pager: Mamie Nick [phone]

## 2017-04-04 NOTE — Progress Notes (Signed)
Orthopedic Tech Progress Note Patient Details:  CLOYD RAGAS 1955-07-29 [id number]  Patient ID: [name], male   DOB: [birth year], 62 y.o.   MRN: [id number]   Hildred Priest 04/04/2017, 9:21 AM Called in bio-tech brace order; spoke with Bella Kennedy

## 2017-04-04 NOTE — Social Work (Addendum)
CSW discussed case with IP Rehab and it appears that patient will be accepted. The family will meet with IP Rehab staff to discuss the admission process.  CSW will continue to follow as needed.  11:50am: [facility] denied to take patient with letter of guarantee at short term rehab facility.   Elissa Hefty, LCSW Clinical Social Worker [phone]

## 2017-04-04 NOTE — Progress Notes (Signed)
Inpatient Rehabilitation  Discussed rehab options with patient and PT this morning; both are in agreement with plan for IP Rehab.  Later returned to meet with patient, niece-Chavonne, and pastor at bedside to discuss team's recommendation for IP Rehab.  Shared booklets, insurance verification letter, answered questions, and toured the rehab center.  All are in agreement with plan for IP rehab in order to maximize functional independence prior to discharge home with assist from niece and church members.  I have discussed case with MD and received medical clearance for admit today.  Have a bed to offer and will proceed with admission.  Discussed with team; call if questions.   Carmelia Roller., CCC/SLP Admission Coordinator  [facility]  Cell [phone]

## 2017-04-04 NOTE — IPOC Note (Addendum)
Overall Plan of Care [facility]) Patient Details Name: Trevor [name] MRN: [id number] DOB: September 05, 1955  Admitting Diagnosis: Right BKA  Hospital Problems: Active Problems:   Type 2 diabetes mellitus with peripheral neuropathy (HCC)   S/P unilateral BKA (below knee amputation), right (HCC)   PAD (peripheral artery disease) ([place])   Drug induced constipation   Stage 3 chronic kidney disease ([place])   Bacteremia     Functional Problem List: Nursing Medication Management, Pain, Safety, Skin Integrity, Motor  PT Balance, Endurance, Motor, Nutrition, Safety, Edema, Behavior, Sensory, Skin Integrity, Pain, Perception  OT Balance, Cognition, Endurance, Motor, Pain, Safety  SLP    TR         Basic ADL's: OT Grooming, Dressing, Bathing, Toileting     Advanced  ADL's: OT Simple Meal Preparation     Transfers: PT Bed Mobility, Bed to Chair, Car, Furniture, Floor  OT Toilet, Metallurgist: PT Ambulation, Emergency planning/management officer, Stairs     Additional Impairments: OT    SLP        TR      Anticipated Outcomes Item Anticipated Outcome  Self Feeding    Swallowing      Basic self-care  Mod I  Toileting  Mod I   Bathroom Transfers Mod I - supervision shower transfers  Bowel/Bladder  Continent of bowel and bladder during admission  Transfers  Supervision assist   Locomotion  Mod I in [place]. Min assist ambulatory for short distances.   Communication     Cognition     Pain  Pain free or pain less than 3  Safety/Judgment  Free from fall and adhere to safety plan   Therapy Plan: PT Intensity: Minimum of 1-2 x/day ,45 to 90 minutes PT Frequency: 5 out of 7 days PT Duration Estimated Length of Stay: 8-12days  OT Intensity: Minimum of 1-2 x/day, 45 to 90 minutes OT Frequency: 5 out of 7 days OT Duration/Estimated Length of Stay: 8-12 days      Team Interventions: Nursing Interventions Patient/Family Education, Pain Management, Skin Care/Wound Management,  Psychosocial Support, Disease Management/Prevention  PT interventions Ambulation/gait training, Training and development officer, Cognitive remediation/compensation, Community reintegration, Discharge planning, DME/adaptive equipment instruction, Functional mobility training, Patient/family education, Pain management, Skin care/wound management, Splinting/orthotics, Stair training, Therapeutic Activities, Therapeutic Exercise, Visual/perceptual remediation/compensation, Disease management/prevention, UE/LE Coordination activities, UE/LE Strength taining/ROM, Wheelchair propulsion/positioning, Psychosocial support  OT Interventions Training and development officer, Cognitive remediation/compensation, Discharge planning, DME/adaptive equipment instruction, Disease mangement/prevention, Functional mobility training, Pain management, Patient/family education, Psychosocial support, Self Care/advanced ADL retraining, Skin care/wound managment, Splinting/orthotics, Therapeutic Activities, Therapeutic Exercise, UE/LE Strength taining/ROM, Wheelchair propulsion/positioning  SLP Interventions    TR Interventions    SW/CM Interventions Discharge Planning, Psychosocial Support, Patient/Family Education   Barriers to Discharge MD  Medical stability, Home enviroment access/loayout, Lack of/limited family support and Weight bearing restrictions  Nursing      PT Decreased caregiver support, Home environment access/layout, Weight, IV antibiotics    OT Decreased caregiver support, Home environment access/layout    SLP      SW       Team Discharge Planning: Destination: PT-Home ,OT-   Home, SLP-  Projected Follow-up: PT-Home health PT, OT-   Home Health OT, SLP-  Projected Equipment Needs: PT-Wheelchair cushion (measurements), Wheelchair (measurements), Rolling walker with 5" wheels, OT-  Tub/shower bench, Drop arm BSC, SLP-  Equipment Details: PT- , OT-  Patient/family involved in discharge planning: PT- Patient,  OT-  Patient, Family member (niece and nephew), SLP-  MD ELOS: 8-13 days. Medical Rehab Prognosis:  Good  Assessment: 62 y.o.right-handed malewith history of diabetes mellitus, hypertension,legally blind, CKD stage III. Presented 03/29/2017 with findings of osteomyelitis abscess of the right foot. 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 Sharol Given. Hospital course pain managementand transition to oral Dilaudid as needed.Wound VAC appliedfor 1 week postoperatively.Plan stump shrinker per [employer] prosthetics.Blood cultures +1 of 2 for strep VIRIDANSbacteremia and advised IV ceftezole and through 04/15/2017.Acute blood loss anemia monitored. Creatinine stable with history of CKD stage III. Patient with resulting functional deficits with mobility, transfers, self-care.  Will set goals for Mod I/Supervision with PT/OT.  See Team Conference Notes for weekly updates to the plan of care

## 2017-04-04 NOTE — PMR Pre-admission (Signed)
PMR Admission Coordinator Pre-Admission Assessment  Patient: Trevor [name] is an 62 y.o., male MRN: [id number] DOB: 18-Jan-1955 Height: 6\' 4"  (193 cm) Weight: 134.7 kg (296 lb 15.4 oz)              Insurance Information HMO:     PPO:      PCP:      IPA:      80/20:      OTHER:  PRIMARY: Uninsured/Self-pay      Policy#:       Subscriber:  CM Name:       Phone#:      Fax#:  Pre-Cert#:       Employer:  Benefits:  Phone #:      Name:  Eff. Date:      Deduct:       Out of Pocket Max:       Life Max:  CIR:       SNF:  Outpatient:      Co-Pay:  Home Health:       Co-Pay:  DME:      Co-Pay:  Providers:   Medicaid Application Date:       Case Manager:  Disability Application Date:       Case Worker:  Applications are being handled by an attorney the niece is working with  Emergency Contact Information Contact Information    Name Relation Home Work Trevor [name], [place] Niece   [phone]   [place],Trevor [name] Aunt [phone]       Current Medical History  Patient Admitting Diagnosis: Right BKA   History of Present Illness: Trevor [name] a 61 y.o.right-handed malewith history of diabetes mellitus, hypertension, legally blind, CKD stage III.Per chartreview and patient, nephew has stayed with patient in [place], but recently got a girlfriend. Nephew attends school and works.Supportive niece in the area also works.Presented 03/29/2017 with findings of osteomyelitis abscess of the right foot. 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 Sharol Given. Hospital course pain management and transition to oral Dilaudid as needed. Wound VAC applied for 1 week postoperatively.  Plan stump shrinker per [employer] prosthetics.  Blood cultures +1 of 2 for strep VIRIDANS bacteremia and advised IV ceftezole and through 04/15/2017.Acute blood loss anemia 9.7 and  monitored. SQ lovenox forDVT prophylaxis. Creatinine stable with history of CKD stage III latest creatinine 1.17 from baseline 2.06.Physical therapy evaluation completed with recommendations of physical medicine rehab consult.  Patient was admitted for a comprehensive rehab program 04/04/17.         Past Medical History  Past Medical History:  Diagnosis Date  . Diabetes mellitus without complication ([place])   . Heart murmur   . Hyperlipidemia   . Hypertension   . Sleep apnea     Family History  family history is not on file.  Prior Rehab/Hospitalizations:  Has the patient had major surgery during 100 days prior to admission? No  Current Medications   Current Facility-Administered Medications:  .  0.9 %  sodium chloride infusion, , Intravenous, Continuous, Newt Minion, MD, Last Rate: 10 mL/hr at 03/31/17 1424 .  0.9 %  sodium chloride infusion, , Intravenous, Continuous, Newt Minion, MD, Last Rate: 10 mL/hr at 04/01/17 0823 .  0.9 %  sodium chloride infusion, , Intravenous, Continuous, Newt Minion, MD, Last Rate: 10 mL/hr at 04/01/17 1216 .  acetaminophen (TYLENOL) tablet 650 mg, 650 mg,  Oral, Q6H PRN, 650 mg at 04/03/17 1838 **OR** acetaminophen (TYLENOL) suppository 650 mg, 650 mg, Rectal, Q6H PRN, Newt Minion, MD .  bisacodyl (DULCOLAX) suppository 10 mg, 10 mg, Rectal, Daily PRN, Newt Minion, MD .  ceFAZolin (ANCEF) IVPB 2g/100 mL premix, 2 g, Intravenous, Q6H, Karren Cobble, RPH, Last Rate: 200 mL/hr at 04/04/17 1321, 2 g at 04/04/17 1321 .  docusate sodium (COLACE) capsule 100 mg, 100 mg, Oral, BID, Newt Minion, MD, 100 mg at 04/04/17 0930 .  enoxaparin (LOVENOX) injection 40 mg, 40 mg, Subcutaneous, Q24H, Colbert Ewing, MD, 40 mg at 04/04/17 1322 .  guaiFENesin (MUCINEX) 12 hr tablet 600 mg, 600 mg, Oral, BID, Svalina, Gorica, MD, 600 mg at 04/04/17 1332 .  hydrochlorothiazide (MICROZIDE) capsule 12.5 mg, 12.5 mg, Oral, Daily, Colbert Ewing, MD,  12.5 mg at 04/04/17 1332 .  insulin aspart (novoLOG) injection 0-20 Units, 0-20 Units, Subcutaneous, TID WC, Newt Minion, MD, 7 Units at 04/04/17 1320 .  insulin aspart (novoLOG) injection 0-5 Units, 0-5 Units, Subcutaneous, QHS, Newt Minion, MD, 3 Units at 04/01/17 2230 .  insulin aspart (novoLOG) injection 10 Units, 10 Units, Subcutaneous, TID WC, Alphonzo Grieve, MD, 10 Units at 04/04/17 1304 .  insulin detemir (LEVEMIR) injection 40 Units, 40 Units, Subcutaneous, QHS, Svalina, Gorica, MD .  losartan (COZAAR) tablet 100 mg, 100 mg, Oral, Daily, Colbert Ewing, MD, 100 mg at 04/04/17 0929 .  magnesium citrate solution 1 Bottle, 1 Bottle, Oral, Once PRN, Newt Minion, MD .  methocarbamol (ROBAXIN) tablet 500 mg, 500 mg, Oral, Q6H PRN, 500 mg at 04/04/17 1033 **OR** methocarbamol (ROBAXIN) 500 mg in dextrose 5 % 50 mL IVPB, 500 mg, Intravenous, Q6H PRN, Newt Minion, MD .  metoCLOPramide (REGLAN) tablet 5-10 mg, 5-10 mg, Oral, Q8H PRN **OR** metoCLOPramide (REGLAN) injection 5-10 mg, 5-10 mg, Intravenous, Q8H PRN, Newt Minion, MD .  NIFEdipine (PROCARDIA XL/ADALAT-CC) 24 hr tablet 90 mg, 90 mg, Oral, Daily, Svalina, Gorica, MD, 90 mg at 04/04/17 1232 .  ondansetron (ZOFRAN) tablet 4 mg, 4 mg, Oral, Q6H PRN **OR** ondansetron (ZOFRAN) injection 4 mg, 4 mg, Intravenous, Q6H PRN, Newt Minion, MD .  oxyCODONE-acetaminophen (PERCOCET/ROXICET) 5-325 MG per tablet 1-2 tablet, 1-2 tablet, Oral, Q6H PRN, Jari Favre, Gorica, MD .  polyethylene glycol (MIRALAX / GLYCOLAX) packet 17 g, 17 g, Oral, Daily, Newt Minion, MD, 17 g at 04/02/17 0909 .  polyethylene glycol (MIRALAX / GLYCOLAX) packet 17 g, 17 g, Oral, Daily PRN, Newt Minion, MD .  senna-docusate (Senokot-S) tablet 1 tablet, 1 tablet, Oral, QHS, Newt Minion, MD, 1 tablet at 04/01/17 2203 .  sodium chloride flush (NS) 0.9 % injection 10-40 mL, 10-40 mL, Intracatheter, PRN, Lucious Groves, DO .  zolpidem (AMBIEN) tablet 5 mg, 5 mg,  Oral, QHS PRN, Garald Balding, MD  Patients Current Diet: Diet Carb Modified Fluid consistency: Thin; Room service appropriate? 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  He has a niece that is local and helps out as needed.    Home Assistive Devices / Equipment Home Assistive Devices/Equipment: Cane (specify quad or straight) Home Equipment:  None  Prior Device Use: Indicate devices/aids used by the patient prior to current illness, exacerbation or injury? None of the above  Prior Functional Level Prior Function Level of Independence: Independent  Self Care: Did the patient need help bathing, dressing, using the toilet or eating? Independent  Indoor Mobility: Did the patient need assistance with walking from room to room (with or without device)? Independent  Stairs: Did the patient need assistance with internal or external stairs (with or without device)? Independent  Functional Cognition: Did the patient need help planning regular tasks such as shopping or remembering to take medications? Independent  Current Functional Level Cognition  Overall Cognitive Status: Within Functional Limits for tasks assessed Orientation Level: Oriented X4    Extremity Assessment (includes Sensation/Coordination)  Upper Extremity Assessment: Overall WFL for tasks assessed  Lower Extremity Assessment: RLE deficits/detail RLE Deficits / Details: s/p BKA with wound vac in place    ADLs       Mobility  Overal bed mobility: Needs Assistance Bed Mobility: Supine to Sit, Sit to Supine Supine to sit: Min guard, HOB elevated Sit to supine:  Min guard, HOB elevated General bed mobility comments: +rail, increased time and effort, verbal cues for sequencing     Transfers  Overall transfer level: Needs assistance Equipment used: Rolling walker (2 wheeled) Transfers: Sit to/from Stand Sit to Stand: Mod assist General transfer comment: Light mod assist to power up; cues for hand placement, then took time for pt to try different options for hand placement    Ambulation / Gait / Stairs / Wheelchair Mobility  Ambulation/Gait General Gait Details: unable    Posture / Balance Dynamic Sitting Balance Sitting balance - Comments: Pt sat EOB x 3-5 minutes for LE exercises. Balance Overall balance assessment: Needs assistance Sitting-balance support: No upper extremity supported, Feet unsupported Sitting balance-Leahy Scale: Good Sitting balance - Comments: Pt sat EOB x 3-5 minutes for LE exercises. Standing balance-Leahy Scale: Poor    Special needs/care consideration BiPAP/CPAP: No CPM: No Continuous Drip IV: No Dialysis: No         Life Vest: No Oxygen: No Special Bed: No Trach Size: No Wound Vac (area): Yes      Location: Right BKA stump Skin: Dry and monitoring left foot, may require podiatry follow up as an outpatient for management of toe nails.                                 Bowel mgmt: Continent, last BM 04/04/17 Bladder mgmt: External catheter  Diabetic mgmt: Yes, PTA occasional CBG checks, oral medications, and Levamir; HgbA1c 12.5     Previous Home Environment Living Arrangements: Other relatives Available Help at Discharge: Family, Available PRN/intermittently Type of Home: House Home Layout: One level Home Access: Ramped entrance Bathroom Shower/Tub: Chiropodist: [place]: No  Discharge Living Setting Plans for Discharge Living Setting: Patient's home, House Type of Home at Discharge: House Discharge Home Layout: One level Discharge Home Access: [place]  entrance Discharge Bathroom Shower/Tub: Pender unit, Curtain Discharge Bathroom Toilet: Standard Discharge Bathroom Accessibility: No Does the patient have any problems obtaining your medications?: Yes (Describe)(he is uninsured with pending SSI/SSD with an attorney )  [place] Patient Roles: Other (Comment)(Uncle) Contact Information: Niece, Dixon Boos [phone] Anticipated Caregiver: Niece and fellow church memebers as needed  Anticipated Caregiver's Contact Information: See above  Ability/Limitations of Caregiver: Niece has 3 children and works  part time  Caregiver Availability: Intermittent Discharge Plan Discussed with Primary Caregiver: Yes Is Caregiver In Agreement with Plan?: Yes Does Caregiver/Family have Issues with Lodging/Transportation while Pt is in Rehab?: No  Goals/Additional Needs Patient/Family Goal for Rehab: PT/OT: Mod I  Expected length of stay: 5-7 days  Cultural Considerations: Christian with observance on Saturday with no therapy  Dietary Needs: Carb. Mod. diet restrictions  Equipment Needs: TBD Pt/Family Agrees to Admission and willing to participate: Yes Program Orientation Provided & Reviewed with Pt/Caregiver Including Roles  & Responsibilities: Yes Additional Information Needs: Pt. with attorney for SSI/SSD  Information Needs to be Provided By: FYI for CSW and niece has been handeling   Barriers to Discharge: Home environment access/layout, Medication compliance, Decreased caregiver support  Decrease burden of Care through IP rehab admission: No  Possible need for SNF placement upon discharge: No  Patient Condition: This patient's medical and functional status has changed since the consult dated 04/03/17 in which the Rehabilitation Physician determined and documented that the patient was potentially appropriate for intensive rehabilitative care in an inpatient rehabilitation facility. Issues have been addressed and update  has been discussed with Dr. Naaman Plummer and patient now appropriate for inpatient rehabilitation. Will admit to inpatient rehab today.   Preadmission Screen Completed By:  Gunnar Fusi, 04/04/2017 2:02 PM ______________________________________________________________________   Discussed status with Dr. Naaman Plummer on 04/04/17 at 1400 and received telephone approval for admission today.  Admission Coordinator:  Gunnar Fusi, time 1400/Date 04/04/17

## 2017-04-04 NOTE — H&P (Signed)
Physical Medicine and Rehabilitation Admission H&P       Chief Complaint  Patient presents with  . Code Sepsis  . Hyperglycemia  : HPI: Trevor Dolores Murdockis a 62 y.o.right-handed malewith history of diabetes mellitus, hypertension,legally blind, CKD stage III.Per chartreview and patient, patientlives with nephew in [name]. Nephew attends school and works.Family in area work.Presented 03/29/2017 with findings of osteomyelitis abscess of the right foot. 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 Sharol Given. Hospital course pain management and transition to oral Dilaudid as needed.Wound VAC applied for 1 week postoperatively.  Plan stump shrinker per [employer] prosthetics.  Blood cultures +1 of 2 for strep VIRIDANS bacteremia and advised IV ceftezole and through 04/15/2017.Acute blood loss anemia 9.7 and monitored.SQ lovenox forDVT prophylaxis.  Creatinine stable with history of CKD stage III latest creatinine 1.17 from baseline 2.06Physical therapy evaluation completed with recommendations of physical medicine rehab consult.  Patient was admitted for a comprehensive rehab program    Review of Systems  Constitutional: Negative for chills and fever.  HENT: Negative for hearing loss.   Eyes:       Legally blind  Respiratory: Negative for cough and shortness of breath.   Cardiovascular: Positive for leg swelling. Negative for chest pain and palpitations.  Gastrointestinal: Positive for constipation. Negative for nausea and vomiting.  Genitourinary: Positive for urgency. Negative for dysuria, flank pain and hematuria.  Musculoskeletal: Positive for joint pain and myalgias.  Skin: Negative for rash.  All other systems reviewed and are negative.      Past Medical History:  Diagnosis Date  . Diabetes mellitus without complication ([place])     . Heart murmur   . Hyperlipidemia   . Hypertension   . Sleep apnea         Past Surgical History:  Procedure Laterality Date  . AMPUTATION Right 03/31/2017   Procedure: RIGHT FOOT 1ST AND 2ND RAY AMPUTATION;  Surgeon: Newt Minion, MD;  Location: [facility];  Service: Orthopedics;  Laterality: Right;  . AMPUTATION Right 04/01/2017   Procedure: AMPUTATION BELOW KNEE;  Surgeon: Newt Minion, MD;  Location: [facility];  Service: Orthopedics;  Laterality: Right;  . CORNEAL TRANSPLANT     History reviewed. No pertinent family history. Social History:  reports that he has never smoked. He has never used smokeless tobacco. He reports that he does not drink alcohol or use drugs. Allergies:      Allergies  Allergen Reactions  . Enalapril Cough   Medications Prior to Admission  Medication Sig Dispense Refill  . atenolol (TENORMIN) 100 MG tablet Take 100 mg by mouth daily.    . cyclobenzaprine (FLEXERIL) 10 MG tablet Take 10 mg by mouth 3 (three) times daily as needed for muscle spasms.    . fluticasone (FLONASE) 50 MCG/ACT nasal spray Place 2 sprays into both nostrils daily.    . furosemide (LASIX) 40 MG tablet Take 40 mg by mouth as needed.    [name] glipiZIDE (GLUCOTROL) 10 MG tablet Take 10 mg by mouth 2 (two) times daily.    . insulin detemir (LEVEMIR) 100 UNIT/ML injection Inject 60 Units into the skin 2 (two) times daily.    [name] losartan (COZAAR) 25 MG tablet Take 100 mg by mouth daily.    . meloxicam (MOBIC) 15 MG tablet Take 15 mg by mouth daily.    . metFORMIN (GLUCOPHAGE) 1000 MG tablet Take 1,000  mg by mouth 2 (two) times daily.    [name] NIFEdipine (PROCARDIA XL/ADALAT-CC) 90 MG 24 hr tablet Take 90 mg by mouth daily.    . potassium chloride SA (K-DUR,KLOR-CON) 20 MEQ tablet Take 1 tablet (20 mEq total) 2 (two) times daily by mouth. 60 tablet 3  . silver sulfADIAZINE (SILVADENE) 1 % cream Apply 1 application topically daily.    [name] glucose blood (TRUE METRIX BLOOD  GLUCOSE TEST) test strip Use as instructed (Patient not taking: Reported on 03/29/2017) 100 each 12    Drug Regimen Review Drug regimen was reviewed and remains appropriate with no significant issues identified  Home: Home Living Family/patient expects to be discharged to:: Private residence Living Arrangements: Other relatives Available Help at Discharge: Family, Available PRN/intermittently Type of Home: House Home Access: Ramped entrance Home Layout: One level Bathroom Shower/Tub: Chiropodist: Standard Home Equipment: None   Functional History: Prior Function Level of Independence: Independent  Functional Status:  Mobility: Bed Mobility Overal bed mobility: Needs Assistance Bed Mobility: Supine to Sit, Sit to Supine Supine to sit: Min guard, HOB elevated Sit to supine: Min guard, HOB elevated General bed mobility comments: +rail, increased time and effort, verbal cues for sequencing  Transfers General transfer comment: unable to progress beyond EOB due to pain/pt refusal Ambulation/Gait General Gait Details: unable  ADL:  Cognition: Cognition Overall Cognitive Status: Within Functional Limits for tasks assessed Orientation Level: Oriented X4 Cognition Arousal/Alertness: Awake/alert Behavior During Therapy: WFL for tasks assessed/performed Overall Cognitive Status: Within Functional Limits for tasks assessed  Physical Exam: Blood pressure (!) 153/86, pulse (!) 103, temperature 98.8 F (37.1 C), temperature source Oral, resp. rate 18, height 6' 4"  (1.93 m), weight 134.7 kg (296 lb 15.4 oz), SpO2 97 %. Physical Exam  Vitals reviewed. Constitutional:  obese  HENT:  Head: Normocephalic and atraumatic.  Eyes: EOM are normal. Right eye exhibits no discharge. Left eye exhibits no discharge.  Vision limited but functional  Neck: Normal range of motion. Neck supple. No tracheal deviation present. No thyromegaly present.  Cardiovascular:  Regular rhythm. Exam reveals no friction rub.  No murmur heard. tachy  Respiratory: Effort normal. No respiratory distress.  GI: Soft. Bowel sounds are normal. He exhibits no distension.  Skin.  Right AKA , stump protector in place.  Neurological.  Patient is alert and oriented to person place and time.  Follows simple commands.  Bilateral upper extremities 5/5 prox to distal. LLE: 4/5 HF, KE and ADF/PF. Can lift Right leg against gravity.  Psych: pleasant and appropriate     LabResultsLast48Hours        Results for orders placed or performed during the hospital encounter of 03/29/17 (from the past 48 hour(s))  CBC     Status: Abnormal   Collection Time: 04/02/17  6:51 AM  Result Value Ref Range   WBC 8.1 4.0 - 10.5 K/uL   RBC 3.65 (L) 4.22 - 5.81 MIL/uL   Hemoglobin 9.7 (L) 13.0 - 17.0 g/dL   HCT 29.0 (L) 39.0 - 52.0 %   MCV 79.5 78.0 - 100.0 fL   MCH 26.6 26.0 - 34.0 pg   MCHC 33.4 30.0 - 36.0 g/dL   RDW 13.9 11.5 - 15.5 %   Platelets 431 (H) 150 - 400 K/uL    Comment: Performed at [facility], [place] [address], [place],  [address]  Basic metabolic panel     Status: Abnormal   Collection Time: 04/02/17  6:51 AM  Result  Value Ref Range   Sodium 135 135 - 145 mmol/L   Potassium 3.3 (L) 3.5 - 5.1 mmol/L   Chloride 105 101 - 111 mmol/L   CO2 24 22 - 32 mmol/L   Glucose, Bld 159 (H) 65 - 99 mg/dL   BUN 20 6 - 20 mg/dL   Creatinine, Ser 1.44 (H) 0.61 - 1.24 mg/dL   Calcium 8.0 (L) 8.9 - 10.3 mg/dL   GFR calc non Af Amer 51 (L) >60 mL/min   GFR calc Af Amer 60 (L) >60 mL/min    Comment: (NOTE) The eGFR has been calculated using the CKD EPI equation. This calculation has not been validated in all clinical situations. eGFR's persistently <60 mL/min signify possible Chronic Kidney Disease.    Anion gap 6 5 - 15    Comment: Performed at [facility] [address], [place], [place] [address]  Glucose, capillary      Status: Abnormal   Collection Time: 04/02/17 10:57 AM  Result Value Ref Range   Glucose-Capillary 247 (H) 65 - 99 mg/dL  Glucose, capillary     Status: Abnormal   Collection Time: 04/02/17  4:54 PM  Result Value Ref Range   Glucose-Capillary 276 (H) 65 - 99 mg/dL  Glucose, capillary     Status: Abnormal   Collection Time: 04/03/17 12:18 AM  Result Value Ref Range   Glucose-Capillary 196 (H) 65 - 99 mg/dL  Glucose, capillary     Status: Abnormal   Collection Time: 04/03/17  6:38 AM  Result Value Ref Range   Glucose-Capillary 213 (H) 65 - 99 mg/dL  Basic metabolic panel     Status: Abnormal   Collection Time: 04/03/17  6:40 AM  Result Value Ref Range   Sodium 136 135 - 145 mmol/L   Potassium 3.6 3.5 - 5.1 mmol/L   Chloride 105 101 - 111 mmol/L   CO2 23 22 - 32 mmol/L   Glucose, Bld 220 (H) 65 - 99 mg/dL   BUN 19 6 - 20 mg/dL   Creatinine, Ser 1.30 (H) 0.61 - 1.24 mg/dL   Calcium 8.1 (L) 8.9 - 10.3 mg/dL   GFR calc non Af Amer 58 (L) >60 mL/min   GFR calc Af Amer >60 >60 mL/min    Comment: (NOTE) The eGFR has been calculated using the CKD EPI equation. This calculation has not been validated in all clinical situations. eGFR's persistently <60 mL/min signify possible Chronic Kidney Disease.    Anion gap 8 5 - 15    Comment: Performed at [facility] [address], [place], [place] [address]  Glucose, capillary     Status: Abnormal   Collection Time: 04/03/17 11:53 AM  Result Value Ref Range   Glucose-Capillary 239 (H) 65 - 99 mg/dL  Glucose, capillary     Status: Abnormal   Collection Time: 04/03/17  4:36 PM  Result Value Ref Range   Glucose-Capillary 242 (H) 65 - 99 mg/dL  Glucose, capillary     Status: Abnormal   Collection Time: 04/03/17  9:30 PM  Result Value Ref Range   Glucose-Capillary 189 (H) 65 - 99 mg/dL  Glucose, capillary     Status: Abnormal   Collection Time: 04/03/17 11:05 PM  Result Value Ref Range    Glucose-Capillary 166 (H) 65 - 99 mg/dL      ImagingResults(Last48hours)  [place] Ekg Site Rite  Result Date: 04/03/2017 If Site Rite image not attached, placement could not be confirmed due to  current cardiac rhythm.       Medical Problem List and Plan: 1.  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  Status post wound VAC             -beginning therapies today 2.  DVT Prophylaxis/Anticoagulation: Subcutaneous Lovenox.  Monitor for any bleeding episodes 3. Pain Management: Dilaudid 2 mg every 4 hours and Robaxin as needed 4. Mood: Provide emotional support 5. Neuropsych: This patient is capable of making decisions on his own behalf. 6. Skin/Wound Care: Routine skin checks 7. Fluids/Electrolytes/Nutrition: Routine I&O's with follow-up chemistries 8.  Acute blood loss anemia.  Follow-up CBC 9.ID.  Blood cultures 1 of 2 viridans strep bacteremia.  Plan to continue Ancef through 04/15/2017             -afebrile at present 10.  Hypertension.  Cozaar 100 mg daily, nifedipine 90 mg daily, HCTZ 12.5 mg daily.  Monitor with increased mobility 11.  Diabetes mellitus with peripheral neuropathy.  Hemoglobin A1c 12.5.  Levemir 40 units nightly, NovoLog 8 units 3 times daily.  Check blood sugars before meals and at bedtime.  Diabetic teaching 12.CKD III.Follow up chemestries 13.  Legally blind.  Patient has adapted well to home environment 14.  Constipation.  Laxative assist    Post Admission Physician Evaluation: 1. Functional deficits secondary  to right BKA. 2. Patient is admitted to receive collaborative, interdisciplinary care between the physiatrist, rehab nursing staff, and therapy team. 3. Patient's level of medical complexity and substantial therapy needs in context of that medical necessity cannot be provided at a lesser intensity of care such as a SNF. 4. Patient has experienced substantial functional loss from his/her baseline which  was documented above under the "Functional History" and "Functional Status" headings.  Judging by the patient's diagnosis, physical exam, and functional history, the patient has potential for functional progress which will result in measurable gains while on inpatient rehab.  These gains will be of substantial and practical use upon discharge  in facilitating mobility and self-care at the household level. 5. Physiatrist will provide 24 hour management of medical needs as well as oversight of the therapy plan/treatment and provide guidance as appropriate regarding the interaction of the two. 6. The Preadmission Screening has been reviewed and patient status is unchanged unless otherwise stated above. 7. 24 hour rehab nursing will assist with bladder management, bowel management, safety, skin/wound care, disease management, medication administration, pain management and patient education  and help integrate therapy concepts, techniques,education, etc. 8. PT will assess and treat for/with: Lower extremity strength, range of motion, stamina, balance, functional mobility, safety, adaptive techniques and equipment, pre-prosthetic education, pain control, wound care.   Goals are: mod I. 9. OT will assess and treat for/with: ADL's, functional mobility, safety, upper extremity strength, adaptive techniques and equipment, pain mgt, pre-prosthetic education, community reentry.   Goals are: mod I. Therapy may proceed with showering this patient. 10. SLP will assess and treat for/with: n/a.  Goals are: n/a. 11. Case Management and Social Worker will assess and treat for psychological issues and discharge planning. 12. Team conference will be held weekly to assess progress toward goals and to determine barriers to discharge. 13. Patient will receive at least 3 hours of therapy per day at least 5 days per week. 14. ELOS: 7 days       15. Prognosis:  excellent     Meredith Staggers, MD, [facility]  Physical Medicine & Rehabilitation 04/04/2017  Lavon Paganini  [place], PA-C 04/04/2017

## 2017-04-04 NOTE — Progress Notes (Signed)
ANTIBIOTIC CONSULT NOTE   Pharmacy Consult for ancef Indication: Viridans streip bacteremia  Allergies  Allergen Reactions  . Enalapril Cough    Patient Measurements: Height: 6\' 4"  (193 cm) Weight: 296 lb 15.4 oz (134.7 kg) IBW/kg (Calculated) : 86.8 Adjusted Body Weight:    Vital Signs: Temp: 98.8 F (37.1 C) (03/26 0552) Temp Source: Oral (03/26 0552) BP: 153/86 (03/26 0552) Pulse Rate: 103 (03/26 0552) Intake/Output from previous day: 03/25 0701 - 03/26 0700 In: 878 [P.O.:360; I.V.:284; IV Piggyback:100] Out: 2350 [Urine:2350] Intake/Output from this shift: No intake/output data recorded.  Labs: Recent Labs    04/02/17 0651 04/03/17 0640 04/04/17 0557  WBC 8.1  --   --   HGB 9.7*  --   --   PLT 431*  --   --   CREATININE 1.44* 1.30* 1.17   Estimated Creatinine Clearance: 100.7 mL/min (by C-G formula based on SCr of 1.17 mg/dL). No results for input(s): VANCOTROUGH, VANCOPEAK, VANCORANDOM, GENTTROUGH, GENTPEAK, GENTRANDOM, TOBRATROUGH, TOBRAPEAK, TOBRARND, AMIKACINPEAK, AMIKACINTROU, AMIKACIN in the last 72 hours.   Microbiology:   Medical History: Past Medical History:  Diagnosis Date  . Diabetes mellitus without complication ([employer])   . Heart murmur   . Hyperlipidemia   . Hypertension   . Sleep apnea     Assessment:  ID: D#[id number] for viridans strep bacteremia - now afebrile, WBC normalized, LA down 1.9 Diabetic foot infxn + first MT/great toe osteo, s/p right 1st/2nd ray amputation 3/22 and R BKA 3/23 >> source controlled. Scr 1.17 down - 3/26: lost IV access. Awaiting PICC.  Ancef 3/23 >> (4/6) Zosyn 3/20 >> 3/21 CTX 3/21 >> 3/23 Flagyl 3/21 >> 3/23 Vanc 3/20 >> 3/21, restarted 3/22 >> 3/23 *loading dose 3/20, order d/c'ed by MD by mistake on 3/21 so didn't get a dose  3/20 BCx - 1 of 2 viridans strep (BCID Strep spp) 3/20 UCx - negative 3/21 surgical screen PCR - negative 3/22 right foot abscess (deep cx) - viridans strep 3/23 BCx -   Goal of  Therapy:  Eradication of infection  Plan:  Increase to Ancef 2gm IV Q6H.  Awaiting PICC Rehab  Trevor Talarico S. Trevor [name], PharmD, BCPS Clinical Staff Pharmacist Pager [phone]  Trevor [name] 04/04/2017,11:04 AM

## 2017-04-04 NOTE — Progress Notes (Signed)
Physical Therapy Treatment Patient Details Name: Trevor [name] MRN: [id number] DOB: 1955/12/07 Today's Date: 04/04/2017    History of Present Illness Pt is a 62 year old male with a past medical history of poorly controlled type 2 diabetes, hypertension, CKD stage III, hyperlipidemia, and sleep apnea.  He presented for evaluation of right foot pain and ulcer.  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     PT Comments    Continuing work on functional mobility and activity tolerance;  Much improved activity tolerance today, including therex and initial transfer training; Able to stand with RW in single limb stance approx 1 minute at bedside; Able to simulate later scoot transfers at EOB; Continue to recommend comprehensive inpatient rehab (CIR) for post-acute therapy needs.    Follow Up Recommendations  CIR     Equipment Recommendations  Rolling walker with 5" wheels;3in1 (PT)    Recommendations for Other Services       Precautions / Restrictions Precautions Precautions: Fall Required Braces or Orthoses: Other Brace/Splint Other Brace/Splint: shrinker ordered Restrictions Weight Bearing Restrictions: Yes RLE Weight Bearing: Non weight bearing    Mobility  Bed Mobility Overal bed mobility: Needs Assistance Bed Mobility: Supine to Sit;Sit to Supine     Supine to sit: Min guard;HOB elevated Sit to supine: Min guard;HOB elevated   General bed mobility comments: +rail, increased time and effort, verbal cues for sequencing   Transfers Overall transfer level: Needs assistance Equipment used: Rolling walker (2 wheeled) Transfers: Sit to/from Stand Sit to Stand: Mod assist         General transfer comment: Light mod assist to power up; cues for hand placement, then took time for pt to try different options for hand placement  Ambulation/Gait                 Stairs            Wheelchair Mobility    Modified Rankin  (Stroke Patients Only)       Balance Overall balance assessment: Needs assistance   Sitting balance-Leahy Scale: Good       Standing balance-Leahy Scale: Poor                              Cognition Arousal/Alertness: Awake/alert Behavior During Therapy: WFL for tasks assessed/performed;Flat affect Overall Cognitive Status: Within Functional Limits for tasks assessed                                        Exercises Amputee Exercises Quad Sets: AROM;Both;10 reps;Supine Knee Flexion: AROM;Right;10 reps;Seated Knee Extension: AROM;Right;10 reps;Seated    General Comments General comments (skin integrity, edema, etc.): Discussed desensitization of R residual limb      Pertinent Vitals/Pain Pain Assessment: 0-10 Pain Score: 5  Pain Location: RLE Pain Descriptors / Indicators: Sore Pain Intervention(s): Monitored during session;Patient requesting pain meds-RN notified    Home Living                      Prior Function            PT Goals (current goals can now be found in the care plan section) Acute Rehab PT Goals Patient Stated Goal: not stated PT Goal Formulation: With patient Time For Goal Achievement: 04/16/17 Potential to Achieve Goals: Good  Progress towards PT goals: Progressing toward goals    Frequency    Min 3X/week      PT Plan Current plan remains appropriate    Co-evaluation              AM-PAC PT "6 Clicks" Daily Activity  Outcome Measure  Difficulty turning over in bed (including adjusting bedclothes, sheets and blankets)?: A Little Difficulty moving from lying on back to sitting on the side of the bed? : A Little Difficulty sitting down on and standing up from a chair with arms (e.g., wheelchair, bedside commode, etc,.)?: Unable Help needed moving to and from a bed to chair (including a wheelchair)?: A Lot Help needed walking in hospital room?: Total Help needed climbing 3-5 steps with a  railing? : Total 6 Click Score: 11    End of Session Equipment Utilized During Treatment: Gait belt Activity Tolerance: Patient tolerated treatment well Patient left: in bed;with call bell/phone within reach Nurse Communication: Mobility status PT Visit Diagnosis: Other abnormalities of gait and mobility (R26.89);Difficulty in walking, not elsewhere classified (R26.2);Pain Pain - Right/Left: Right Pain - part of body: Leg     Time: 4098-1191 PT Time Calculation (min) (ACUTE ONLY): 35 min  Charges:  $Therapeutic Exercise: 8-22 mins $Therapeutic Activity: 8-22 mins                    G Codes:       Roney Marion, PT  Acute Rehabilitation Services Pager [phone] Office [phone]    Colletta [place] 04/04/2017, 12:42 PM

## 2017-04-04 NOTE — Progress Notes (Signed)
Patient ID: Trevor [name], male   DOB: Mar 09, 1955, 62 y.o.   MRN: [id number] No drainage in the wound VAC.  At time of discharge patient's hospital wound VAC will be changed to the portable Praveena pump that is in his room.  This will remain in place for 1 week and then discontinue.  Orders are written for  [employer] to provide a stump shrinker and a wound protector.  With patient being legally blind I feel he is an increased risk of falling.  Possible discharge to inpatient versus outpatient rehabilitation.  Patient requests advanced home care for home health needs.

## 2017-04-04 NOTE — H&P (Signed)
Physical Medicine and Rehabilitation Admission H&P    Chief Complaint  Patient presents with  . Code Sepsis  . Hyperglycemia  : HPI: Trevor [name] is a 62 y.o. right-handed male with history of diabetes mellitus, hypertension,legally blind, CKD stage III. Per chart review and patient, patient lives with nephew in [place]. Nephew attends school and works. Family in area work. Presented 03/29/2017 with findings of osteomyelitis abscess of the right foot.  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 Sharol Given.  Hospital course pain management and transition to oral Dilaudid as needed.Wound VAC applied for 1 week postoperatively.  Plan stump shrinker per [employer] prosthetics.  Blood cultures +1 of 2 for strep VIRIDANS bacteremia and advised IV ceftezole and through 04/15/2017. Acute blood loss anemia 9.7 and monitored.SQ lovenox for DVT prophylaxis.  Creatinine stable with history of CKD stage III latest creatinine 1.17 from baseline 2.06 Physical therapy evaluation completed with recommendations of physical medicine rehab consult.  Patient was admitted for a comprehensive rehab program    Review of Systems  Constitutional: Negative for chills and fever.  HENT: Negative for hearing loss.   Eyes:       Legally blind  Respiratory: Negative for cough and shortness of breath.   Cardiovascular: Positive for leg swelling. Negative for chest pain and palpitations.  Gastrointestinal: Positive for constipation. Negative for nausea and vomiting.  Genitourinary: Positive for urgency. Negative for dysuria, flank pain and hematuria.  Musculoskeletal: Positive for joint pain and myalgias.  Skin: Negative for rash.  All other systems reviewed and are negative.  Past Medical History:  Diagnosis Date  . Diabetes mellitus without complication ([place])   . Heart murmur     . Hyperlipidemia   . Hypertension   . Sleep apnea    Past Surgical History:  Procedure Laterality Date  . AMPUTATION Right 03/31/2017   Procedure: RIGHT FOOT 1ST AND 2ND RAY AMPUTATION;  Surgeon: Newt Minion, MD;  Location: [place];  Service: Orthopedics;  Laterality: Right;  . AMPUTATION Right 04/01/2017   Procedure: AMPUTATION BELOW KNEE;  Surgeon: Newt Minion, MD;  Location: [facility];  Service: Orthopedics;  Laterality: Right;  . CORNEAL TRANSPLANT     History reviewed. No pertinent family history. Social History:  reports that he has never smoked. He has never used smokeless tobacco. He reports that he does not drink alcohol or use drugs. Allergies:  Allergies  Allergen Reactions  . Enalapril Cough   Medications Prior to Admission  Medication Sig Dispense Refill  . atenolol (TENORMIN) 100 MG tablet Take 100 mg by mouth daily.    . cyclobenzaprine (FLEXERIL) 10 MG tablet Take 10 mg by mouth 3 (three) times daily as needed for muscle spasms.    . fluticasone (FLONASE) 50 MCG/ACT nasal spray Place 2 sprays into both nostrils daily.    . furosemide (LASIX) 40 MG tablet Take 40 mg by mouth as needed.    [name] glipiZIDE (GLUCOTROL) 10 MG tablet Take 10 mg by mouth 2 (two) times daily.    . insulin detemir (LEVEMIR) 100 UNIT/ML injection Inject 60 Units into the skin 2 (two) times daily.    [name] losartan (COZAAR) 25 MG tablet Take 100 mg by mouth daily.    . meloxicam (MOBIC) 15 MG tablet Take 15 mg by mouth daily.    . metFORMIN (GLUCOPHAGE) 1000 MG tablet Take 1,000 mg by  mouth 2 (two) times daily.    [name] NIFEdipine (PROCARDIA XL/ADALAT-CC) 90 MG 24 hr tablet Take 90 mg by mouth daily.    . potassium chloride SA (K-DUR,KLOR-CON) 20 MEQ tablet Take 1 tablet (20 mEq total) 2 (two) times daily by mouth. 60 tablet 3  . silver sulfADIAZINE (SILVADENE) 1 % cream Apply 1 application topically daily.    [name] glucose blood (TRUE METRIX BLOOD GLUCOSE TEST) test strip Use as instructed (Patient not taking:  Reported on 03/29/2017) 100 each 12    Drug Regimen Review Drug regimen was reviewed and remains appropriate with no significant issues identified  Home: Home Living Family/patient expects to be discharged to:: Private residence Living Arrangements: Other relatives Available Help at Discharge: Family, Available PRN/intermittently Type of Home: House Home Access: Ramped entrance Home Layout: One level Bathroom Shower/Tub: Chiropodist: Standard Home Equipment: None   Functional History: Prior Function Level of Independence: Independent  Functional Status:  Mobility: Bed Mobility Overal bed mobility: Needs Assistance Bed Mobility: Supine to Sit, Sit to Supine Supine to sit: Min guard, HOB elevated Sit to supine: Min guard, HOB elevated General bed mobility comments: +rail, increased time and effort, verbal cues for sequencing  Transfers General transfer comment: unable to progress beyond EOB due to pain/pt refusal Ambulation/Gait General Gait Details: unable    ADL:    Cognition: Cognition Overall Cognitive Status: Within Functional Limits for tasks assessed Orientation Level: Oriented X4 Cognition Arousal/Alertness: Awake/alert Behavior During Therapy: WFL for tasks assessed/performed Overall Cognitive Status: Within Functional Limits for tasks assessed  Physical Exam: Blood pressure (!) 153/86, pulse (!) 103, temperature 98.8 F (37.1 C), temperature source Oral, resp. rate 18, height '6\' 4"'$  (1.93 m), weight 134.7 kg (296 lb 15.4 oz), SpO2 97 %. Physical Exam  Vitals reviewed. Constitutional:  obese  HENT:  Head: Normocephalic and atraumatic.  Eyes: EOM are normal. Right eye exhibits no discharge. Left eye exhibits no discharge.  Vision limited but functional  Neck: Normal range of motion. Neck supple. No tracheal deviation present. No thyromegaly present.  Cardiovascular: Regular rhythm. Exam reveals no friction rub.  No murmur  heard. tachy  Respiratory: Effort normal. No respiratory distress.  GI: Soft. Bowel sounds are normal. He exhibits no distension.  Skin.  Right AKA , stump protector in place.  Neurological.  Patient is alert and oriented to person place and time.  Follows simple commands.  Bilateral upper extremities 5/5 prox to distal. LLE: 4/5 HF, KE and ADF/PF. Can lift Right leg against gravity.  Psych: pleasant and appropriate     Results for orders placed or performed during the hospital encounter of 03/29/17 (from the past 48 hour(s))  CBC     Status: Abnormal   Collection Time: 04/02/17  6:51 AM  Result Value Ref Range   WBC 8.1 4.0 - 10.5 K/uL   RBC 3.65 (L) 4.22 - 5.81 MIL/uL   Hemoglobin 9.7 (L) 13.0 - 17.0 g/dL   HCT 29.0 (L) 39.0 - 52.0 %   MCV 79.5 78.0 - 100.0 fL   MCH 26.6 26.0 - 34.0 pg   MCHC 33.4 30.0 - 36.0 g/dL   RDW 13.9 11.5 - 15.5 %   Platelets 431 (H) 150 - 400 K/uL    Comment: Performed at [facility], [place] [address] [place], [place] [address]  Basic metabolic panel     Status: Abnormal   Collection Time: 04/02/17  6:51 AM  Result Value Ref Range   Sodium  135 135 - 145 mmol/L   Potassium 3.3 (L) 3.5 - 5.1 mmol/L   Chloride 105 101 - 111 mmol/L   CO2 24 22 - 32 mmol/L   Glucose, Bld 159 (H) 65 - 99 mg/dL   BUN 20 6 - 20 mg/dL   Creatinine, Ser 1.44 (H) 0.61 - 1.24 mg/dL   Calcium 8.0 (L) 8.9 - 10.3 mg/dL   GFR calc non Af Amer 51 (L) >60 mL/min   GFR calc Af Amer 60 (L) >60 mL/min    Comment: (NOTE) The eGFR has been calculated using the CKD EPI equation. This calculation has not been validated in all clinical situations. eGFR's persistently <60 mL/min signify possible Chronic Kidney Disease.    Anion gap 6 5 - 15    Comment: Performed at [facility] [address] [place], [place] [address]  Glucose, capillary     Status: Abnormal   Collection Time: 04/02/17 10:57 AM  Result Value Ref Range   Glucose-Capillary 247 (H) 65 - 99 mg/dL   Glucose, capillary     Status: Abnormal   Collection Time: 04/02/17  4:54 PM  Result Value Ref Range   Glucose-Capillary 276 (H) 65 - 99 mg/dL  Glucose, capillary     Status: Abnormal   Collection Time: 04/03/17 12:18 AM  Result Value Ref Range   Glucose-Capillary 196 (H) 65 - 99 mg/dL  Glucose, capillary     Status: Abnormal   Collection Time: 04/03/17  6:38 AM  Result Value Ref Range   Glucose-Capillary 213 (H) 65 - 99 mg/dL  Basic metabolic panel     Status: Abnormal   Collection Time: 04/03/17  6:40 AM  Result Value Ref Range   Sodium 136 135 - 145 mmol/L   Potassium 3.6 3.5 - 5.1 mmol/L   Chloride 105 101 - 111 mmol/L   CO2 23 22 - 32 mmol/L   Glucose, Bld 220 (H) 65 - 99 mg/dL   BUN 19 6 - 20 mg/dL   Creatinine, Ser 1.30 (H) 0.61 - 1.24 mg/dL   Calcium 8.1 (L) 8.9 - 10.3 mg/dL   GFR calc non Af Amer 58 (L) >60 mL/min   GFR calc Af Amer >60 >60 mL/min    Comment: (NOTE) The eGFR has been calculated using the CKD EPI equation. This calculation has not been validated in all clinical situations. eGFR's persistently <60 mL/min signify possible Chronic Kidney Disease.    Anion gap 8 5 - 15    Comment: Performed at [facility] [address], [place], [place] [address]  Glucose, capillary     Status: Abnormal   Collection Time: 04/03/17 11:53 AM  Result Value Ref Range   Glucose-Capillary 239 (H) 65 - 99 mg/dL  Glucose, capillary     Status: Abnormal   Collection Time: 04/03/17  4:36 PM  Result Value Ref Range   Glucose-Capillary 242 (H) 65 - 99 mg/dL  Glucose, capillary     Status: Abnormal   Collection Time: 04/03/17  9:30 PM  Result Value Ref Range   Glucose-Capillary 189 (H) 65 - 99 mg/dL  Glucose, capillary     Status: Abnormal   Collection Time: 04/03/17 11:05 PM  Result Value Ref Range   Glucose-Capillary 166 (H) 65 - 99 mg/dL   [place] Ekg Site Rite  Result Date: 04/03/2017 If Site Rite image not attached, placement could not be confirmed due to current  cardiac rhythm.      Medical Problem List  and Plan: 1.  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  Status post wound VAC   -beginning therapies today 2.  DVT Prophylaxis/Anticoagulation: Subcutaneous Lovenox.  Monitor for any bleeding episodes 3. Pain Management: Dilaudid 2 mg every 4 hours and Robaxin as needed 4. Mood: Provide emotional support 5. Neuropsych: This patient is capable of making decisions on his own behalf. 6. Skin/Wound Care: Routine skin checks 7. Fluids/Electrolytes/Nutrition: Routine I&O's with follow-up chemistries 8.  Acute blood loss anemia.  Follow-up CBC 9.ID.  Blood cultures 1 of 2 viridans strep bacteremia.  Plan to continue Ancef through 04/15/2017   -afebrile at present 10.  Hypertension.  Cozaar 100 mg daily, nifedipine 90 mg daily, HCTZ 12.5 mg daily.  Monitor with increased mobility 11.  Diabetes mellitus with peripheral neuropathy.  Hemoglobin A1c 12.5.  Levemir 40 units nightly, NovoLog 8 units 3 times daily.  Check blood sugars before meals and at bedtime.  Diabetic teaching 12.CKD III.Follow up chemestries 13.  Legally blind.  Patient has adapted well to home environment 14.  Constipation.  Laxative assist    Post Admission Physician Evaluation: 1. Functional deficits secondary  to right BKA. 2. Patient is admitted to receive collaborative, interdisciplinary care between the physiatrist, rehab nursing staff, and therapy team. 3. Patient's level of medical complexity and substantial therapy needs in context of that medical necessity cannot be provided at a lesser intensity of care such as a SNF. 4. Patient has experienced substantial functional loss from his/her baseline which was documented above under the "Functional History" and "Functional Status" headings.  Judging by the patient's diagnosis, physical exam, and functional history, the patient has potential for functional progress which will result in  measurable gains while on inpatient rehab.  These gains will be of substantial and practical use upon discharge  in facilitating mobility and self-care at the household level. 5. Physiatrist will provide 24 hour management of medical needs as well as oversight of the therapy plan/treatment and provide guidance as appropriate regarding the interaction of the two. 6. The Preadmission Screening has been reviewed and patient status is unchanged unless otherwise stated above. 7. 24 hour rehab nursing will assist with bladder management, bowel management, safety, skin/wound care, disease management, medication administration, pain management and patient education  and help integrate therapy concepts, techniques,education, etc. 8. PT will assess and treat for/with: Lower extremity strength, range of motion, stamina, balance, functional mobility, safety, adaptive techniques and equipment, pre-prosthetic education, pain control, wound care.   Goals are: mod I. 9. OT will assess and treat for/with: ADL's, functional mobility, safety, upper extremity strength, adaptive techniques and equipment, pain mgt, pre-prosthetic education, community reentry.   Goals are: mod I. Therapy may proceed with showering this patient. 10. SLP will assess and treat for/with: n/a.  Goals are: n/a. 11. Case Management and Social Worker will assess and treat for psychological issues and discharge planning. 12. Team conference will be held weekly to assess progress toward goals and to determine barriers to discharge. 13. Patient will receive at least 3 hours of therapy per day at least 5 days per week. 14. ELOS: 7 days       15. Prognosis:  excellent     Meredith Staggers, MD, [facility] 04/04/2017  Lavon Paganini [place], PA-C 04/04/2017

## 2017-04-04 NOTE — Progress Notes (Signed)
Inpatient Diabetes Program Recommendations  AACE/ADA: New Consensus Statement on Inpatient Glycemic Control (2015)  Target Ranges:  Prepandial:   less than 140 mg/dL      Peak postprandial:   less than 180 mg/dL (1-2 hours)      Critically ill patients:  140 - 180 mg/dL   Lab Results  Component Value Date   GLUCAP 242 (H) 04/04/2017   HGBA1C 12.5 03/29/2017    Review of Glycemic ControlResults for MABRY, TIFT (MRN [id number]) as of 04/04/2017 16:18  Ref. Range 04/03/2017 16:36 04/03/2017 21:30 04/03/2017 23:05 04/04/2017 09:04 04/04/2017 12:07  Glucose-Capillary Latest Ref Range: 65 - 99 mg/dL 242 (H) 189 (H) 166 (H) 291 (H) 242 (H)    Diabetes history: Type 2 DM Outpatient Diabetes medications: Levemir 60 units bid, Glipizide 10 mg bid, Metformin 1000 mg bid Current orders for Inpatient glycemic control:  Novolog resistant tid with meals and HS, Levemir 40 units q HS, Novolog 10 units tid with meals  Inpatient Diabetes Program Recommendations:    Please consider increasing Levemir to 30 units bid.  Talked with patient regarding A1C results.  He states that his blood sugars have been "bad" the last few days.  I explained the results of the A1C and the goal A1C.  Patient states that he gets his insulin from the "medication clinic" in [place].  He see's MD at the [facility] clinic.  We discussed the importance of controlling blood sugars and diabetes. I encouraged patient to check blood sugars and follow-up with PCP.  He verbalized understanding.     Thanks,  Adah Perl, RN, BC-ADM Inpatient Diabetes Coordinator Pager [phone] (8a-5p)

## 2017-04-05 ENCOUNTER — Telehealth: Payer: Self-pay | Admitting: Pharmacist

## 2017-04-05 ENCOUNTER — Other Ambulatory Visit: Payer: Self-pay

## 2017-04-05 ENCOUNTER — Inpatient Hospital Stay (HOSPITAL_COMMUNITY): Payer: Medicare Other | Admitting: Occupational Therapy

## 2017-04-05 ENCOUNTER — Inpatient Hospital Stay (HOSPITAL_COMMUNITY): Payer: Self-pay | Admitting: Occupational Therapy

## 2017-04-05 ENCOUNTER — Inpatient Hospital Stay (HOSPITAL_COMMUNITY): Payer: Self-pay | Admitting: Physical Therapy

## 2017-04-05 DIAGNOSIS — K5903 Drug induced constipation: Secondary | ICD-10-CM

## 2017-04-05 DIAGNOSIS — N183 Chronic kidney disease, stage 3 unspecified: Secondary | ICD-10-CM

## 2017-04-05 DIAGNOSIS — D62 Acute posthemorrhagic anemia: Secondary | ICD-10-CM

## 2017-04-05 DIAGNOSIS — H548 Legal blindness, as defined in USA: Secondary | ICD-10-CM

## 2017-04-05 DIAGNOSIS — R7881 Bacteremia: Secondary | ICD-10-CM

## 2017-04-05 LAB — COMPREHENSIVE METABOLIC PANEL
ALT: 44 U/L (ref 17–63)
AST: 75 U/L — ABNORMAL HIGH (ref 15–41)
Albumin: 2.1 g/dL — ABNORMAL LOW (ref 3.5–5.0)
Alkaline Phosphatase: 77 U/L (ref 38–126)
Anion gap: 10 (ref 5–15)
BUN: 22 mg/dL — ABNORMAL HIGH (ref 6–20)
CO2: 24 mmol/L (ref 22–32)
Calcium: 8.5 mg/dL — ABNORMAL LOW (ref 8.9–10.3)
Chloride: 102 mmol/L (ref 101–111)
Creatinine, Ser: 1.25 mg/dL — ABNORMAL HIGH (ref 0.61–1.24)
Glucose, Bld: 231 mg/dL — ABNORMAL HIGH (ref 65–99)
POTASSIUM: 3.6 mmol/L (ref 3.5–5.1)
SODIUM: 136 mmol/L (ref 135–145)
Total Bilirubin: 0.5 mg/dL (ref 0.3–1.2)
Total Protein: 6.2 g/dL — ABNORMAL LOW (ref 6.5–8.1)

## 2017-04-05 LAB — GLUCOSE, CAPILLARY
GLUCOSE-CAPILLARY: 167 mg/dL — AB (ref 65–99)
GLUCOSE-CAPILLARY: 226 mg/dL — AB (ref 65–99)
GLUCOSE-CAPILLARY: 183 mg/dL — AB (ref 65–99)
GLUCOSE-CAPILLARY: 168 mg/dL — AB (ref 65–99)

## 2017-04-05 LAB — CBC WITH DIFFERENTIAL/PLATELET
BASOS ABS: 0 10*3/uL (ref 0.0–0.1)
Basophils Relative: 0 %
EOS ABS: 0.1 10*3/uL (ref 0.0–0.7)
Eosinophils Relative: 1 %
HCT: 27.8 % — ABNORMAL LOW (ref 39.0–52.0)
Hemoglobin: 9.2 g/dL — ABNORMAL LOW (ref 13.0–17.0)
LYMPHS ABS: 1.7 10*3/uL (ref 0.7–4.0)
LYMPHS PCT: 10 %
MCH: 26.5 pg (ref 26.0–34.0)
MCHC: 33.1 g/dL (ref 30.0–36.0)
MCV: 80.1 fL (ref 78.0–100.0)
MONO ABS: 1.1 10*3/uL — AB (ref 0.1–1.0)
Monocytes Relative: 6 %
Neutro Abs: 15 10*3/uL — ABNORMAL HIGH (ref 1.7–7.7)
Neutrophils Relative %: 83 %
Platelets: 588 10*3/uL — ABNORMAL HIGH (ref 150–400)
RBC: 3.47 MIL/uL — AB (ref 4.22–5.81)
RDW: 14 % (ref 11.5–15.5)
WBC: 17.9 10*3/uL — AB (ref 4.0–10.5)

## 2017-04-05 MED ORDER — SODIUM CHLORIDE 0.9% FLUSH
10.0000 mL | INTRAVENOUS | Status: DC | PRN
  Administered 2017-04-06: 10 mL
  Filled 2017-04-05: qty 40

## 2017-04-05 MED ORDER — PNEUMOCOCCAL VAC POLYVALENT 25 MCG/0.5ML IJ INJ
0.5000 mL | INJECTION | INTRAMUSCULAR | Status: AC
Start: 2017-04-06 — End: 2017-04-06
  Administered 2017-04-06: 0.5 mL via INTRAMUSCULAR
  Filled 2017-04-05: qty 0.5

## 2017-04-05 NOTE — Progress Notes (Signed)
PMR Admission Coordinator Pre-Admission Assessment  Patient: Trevor [name] is an 62 y.o., male MRN: [id number] DOB: 1955/10/28 Height: 6\' 4"  (193 cm) Weight: 134.7 kg (296 lb 15.4 oz)                                                                                                                                                  Insurance Information HMO:     PPO:      PCP:      IPA:      80/20:      OTHER:  PRIMARY: Uninsured/Self-pay      Policy#:       Subscriber:  CM Name:       Phone#:      Fax#:  Pre-Cert#:       Employer:  Benefits:  Phone #:      Name:  Eff. Date:      Deduct:       Out of Pocket Max:       Life Max:  CIR:       SNF:  Outpatient:      Co-Pay:  Home Health:       Co-Pay:  DME:      Co-Pay:  Providers:   Medicaid Application Date:       Case Manager:  Disability Application Date:       Case Worker:  Applications are being handled by an attorney the niece is working with  Emergency Contact Information         Contact Information    Name Relation Home Work [place], [place] Niece   [phone]   Trevor [name],Trevor [name] Aunt [phone]       Current Medical History  Patient Admitting Diagnosis: Right BKA   History of Present Illness: Trevor Ruan Murdockis a 62 y.o.right-handed malewith history of diabetes mellitus, hypertension, legally blind, CKD stage III.Per chartreview and patient, nephew has stayed with patient in [place], but recently got a girlfriend. Nephew attends school and works.Supportive niece in the area also works.Presented 03/29/2017 with findings of osteomyelitis abscess of the right foot. 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 Sharol Given. Hospital course pain managementand transition to oral Dilaudid as needed. Wound VAC appliedfor 1 week postoperatively.Plan stump shrinker per  [employer] prosthetics.Blood cultures +1 of 2 for strep VIRIDANSbacteremia and advised IV ceftezole and through 04/15/2017.Acute blood loss anemia 9.7 and monitored. SQ lovenox forDVT prophylaxis.Creatinine stable with history of CKD stage III latest creatinine 1.40from baseline 2.06.Physical therapy evaluation completed with recommendations of physical medicine rehab consult.Patient was admitted for a comprehensive rehab program 04/04/17.     Past Medical History      Past Medical History:  Diagnosis Date  . Diabetes mellitus without complication ([place])   .  Heart murmur   . Hyperlipidemia   . Hypertension   . Sleep apnea     Family History  family history is not on file.  Prior Rehab/Hospitalizations:  Has the patient had major surgery during 100 days prior to admission? No  Current Medications   Current Facility-Administered Medications:  .  0.9 %  sodium chloride infusion, , Intravenous, Continuous, Newt Minion, MD, Last Rate: 10 mL/hr at 03/31/17 1424 .  0.9 %  sodium chloride infusion, , Intravenous, Continuous, Newt Minion, MD, Last Rate: 10 mL/hr at 04/01/17 0823 .  0.9 %  sodium chloride infusion, , Intravenous, Continuous, Newt Minion, MD, Last Rate: 10 mL/hr at 04/01/17 1216 .  acetaminophen (TYLENOL) tablet 650 mg, 650 mg, Oral, Q6H PRN, 650 mg at 04/03/17 1838 **OR** acetaminophen (TYLENOL) suppository 650 mg, 650 mg, Rectal, Q6H PRN, Newt Minion, MD .  bisacodyl (DULCOLAX) suppository 10 mg, 10 mg, Rectal, Daily PRN, Newt Minion, MD .  ceFAZolin (ANCEF) IVPB 2g/100 mL premix, 2 g, Intravenous, Q6H, Robertson, Crystal S, RPH, Last Rate: 200 mL/hr at 04/04/17 1321, 2 g at 04/04/17 1321 .  docusate sodium (COLACE) capsule 100 mg, 100 mg, Oral, BID, Newt Minion, MD, 100 mg at 04/04/17 0930 .  enoxaparin (LOVENOX) injection 40 mg, 40 mg, Subcutaneous, Q24H, Colbert Ewing, MD, 40 mg at 04/04/17 1322 .  guaiFENesin (MUCINEX) 12 hr tablet 600  mg, 600 mg, Oral, BID, Svalina, Gorica, MD, 600 mg at 04/04/17 1332 .  hydrochlorothiazide (MICROZIDE) capsule 12.5 mg, 12.5 mg, Oral, Daily, Colbert Ewing, MD, 12.5 mg at 04/04/17 1332 .  insulin aspart (novoLOG) injection 0-20 Units, 0-20 Units, Subcutaneous, TID WC, Newt Minion, MD, 7 Units at 04/04/17 1320 .  insulin aspart (novoLOG) injection 0-5 Units, 0-5 Units, Subcutaneous, QHS, Newt Minion, MD, 3 Units at 04/01/17 2230 .  insulin aspart (novoLOG) injection 10 Units, 10 Units, Subcutaneous, TID WC, Alphonzo Grieve, MD, 10 Units at 04/04/17 1304 .  insulin detemir (LEVEMIR) injection 40 Units, 40 Units, Subcutaneous, QHS, Svalina, Gorica, MD .  losartan (COZAAR) tablet 100 mg, 100 mg, Oral, Daily, Colbert Ewing, MD, 100 mg at 04/04/17 0929 .  magnesium citrate solution 1 Bottle, 1 Bottle, Oral, Once PRN, Newt Minion, MD .  methocarbamol (ROBAXIN) tablet 500 mg, 500 mg, Oral, Q6H PRN, 500 mg at 04/04/17 1033 **OR** methocarbamol (ROBAXIN) 500 mg in dextrose 5 % 50 mL IVPB, 500 mg, Intravenous, Q6H PRN, Newt Minion, MD .  metoCLOPramide (REGLAN) tablet 5-10 mg, 5-10 mg, Oral, Q8H PRN **OR** metoCLOPramide (REGLAN) injection 5-10 mg, 5-10 mg, Intravenous, Q8H PRN, Newt Minion, MD .  NIFEdipine (PROCARDIA XL/ADALAT-CC) 24 hr tablet 90 mg, 90 mg, Oral, Daily, Svalina, Gorica, MD, 90 mg at 04/04/17 1232 .  ondansetron (ZOFRAN) tablet 4 mg, 4 mg, Oral, Q6H PRN **OR** ondansetron (ZOFRAN) injection 4 mg, 4 mg, Intravenous, Q6H PRN, Newt Minion, MD .  oxyCODONE-acetaminophen (PERCOCET/ROXICET) 5-325 MG per tablet 1-2 tablet, 1-2 tablet, Oral, Q6H PRN, Jari Favre, Gorica, MD .  polyethylene glycol (MIRALAX / GLYCOLAX) packet 17 g, 17 g, Oral, Daily, Newt Minion, MD, 17 g at 04/02/17 0909 .  polyethylene glycol (MIRALAX / GLYCOLAX) packet 17 g, 17 g, Oral, Daily PRN, Newt Minion, MD .  senna-docusate (Senokot-S) tablet 1 tablet, 1 tablet, Oral, QHS, Newt Minion, MD, 1 tablet  at 04/01/17 2203 .  sodium chloride flush (NS) 0.9 % injection 10-40 mL, 10-40 mL, Intracatheter,  PRN, Lucious Groves, DO .  zolpidem (AMBIEN) tablet 5 mg, 5 mg, Oral, QHS PRN, Garald Balding, MD  Patients Current Diet: Diet Carb Modified Fluid consistency: Thin; Room service appropriate? 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  He has a niece that is local and helps out as needed.    Home Assistive Devices / Equipment Home Assistive Devices/Equipment: Cane (specify quad or straight) Home Equipment: None  Prior Device Use: Indicate devices/aids used by the patient prior to current illness, exacerbation or injury? None of the above  Prior Functional Level Prior Function Level of Independence: Independent  Self Care: Did the patient need help bathing, dressing, using the toilet or eating? Independent  Indoor Mobility: Did the patient need assistance with walking from room to room (with or without device)? Independent  Stairs: Did the patient need assistance with internal or external stairs (with or without device)? Independent  Functional Cognition: Did the patient need help planning regular tasks such as shopping or remembering to take medications? Independent  Current Functional Level Cognition  Overall Cognitive Status: Within Functional Limits for tasks assessed Orientation Level: Oriented X4    Extremity Assessment (includes Sensation/Coordination)  Upper Extremity Assessment: Overall WFL for tasks assessed  Lower Extremity Assessment: RLE deficits/detail RLE Deficits / Details: s/p BKA with wound vac  in place    ADLs       Mobility  Overal bed mobility: Needs Assistance Bed Mobility: Supine to Sit, Sit to Supine Supine to sit: Min guard, HOB elevated Sit to supine: Min guard, HOB elevated General bed mobility comments: +rail, increased time and effort, verbal cues for sequencing     Transfers  Overall transfer level: Needs assistance Equipment used: Rolling walker (2 wheeled) Transfers: Sit to/from Stand Sit to Stand: Mod assist General transfer comment: Light mod assist to power up; cues for hand placement, then took time for pt to try different options for hand placement    Ambulation / Gait / Stairs / Wheelchair Mobility  Ambulation/Gait General Gait Details: unable    Posture / Balance Dynamic Sitting Balance Sitting balance - Comments: Pt sat EOB x 3-5 minutes for LE exercises. Balance Overall balance assessment: Needs assistance Sitting-balance support: No upper extremity supported, Feet unsupported Sitting balance-Leahy Scale: Good Sitting balance - Comments: Pt sat EOB x 3-5 minutes for LE exercises. Standing balance-Leahy Scale: Poor    Special needs/care consideration BiPAP/CPAP: No CPM: No Continuous Drip IV: No Dialysis: No         Life Vest: No Oxygen: No Special Bed: No Trach Size: No Wound Vac (area): Yes      Location: Right BKA stump Skin: Dry and monitoring left foot, may require podiatry follow up as an outpatient for management of toe nails.                                 Bowel mgmt: Continent, last BM 04/04/17 Bladder mgmt: External catheter  Diabetic mgmt: Yes, PTA occasional CBG checks, oral medications, and Levamir; HgbA1c 12.5     Previous Home Environment Living Arrangements: Other relatives Available Help at  Discharge: Family, Available PRN/intermittently Type of Home: House Home Layout: One level Home Access: Ramped entrance Bathroom Shower/Tub: Chiropodist: [place]:  No  Discharge Living Setting Plans for Discharge Living Setting: Patient's home, House Type of Home at Discharge: House Discharge Home Layout: One level Discharge Home Access: [place] entrance Discharge Bathroom Shower/Tub: [place] unit, Curtain Discharge Bathroom Toilet: Standard Discharge Bathroom Accessibility: No Does the patient have any problems obtaining your medications?: Yes (Describe)(he is uninsured with pending SSI/SSD with an attorney )  [place] Patient Roles: Other (Comment)(Uncle) Contact Information: Niece, Dixon Boos [phone] Anticipated Caregiver: Niece and fellow church memebers as needed  Anticipated Caregiver's Contact Information: See above  Ability/Limitations of Caregiver: Niece has 3 children and works part time  Careers adviser: Intermittent Discharge Plan Discussed with Primary Caregiver: Yes Is Caregiver In Agreement with Plan?: Yes Does Caregiver/Family have Issues with Lodging/Transportation while Pt is in Rehab?: No  Goals/Additional Needs Patient/Family Goal for Rehab: PT/OT: Mod I  Expected length of stay: 5-7 days  Cultural Considerations: Christian with observance on Saturday with no therapy  Dietary Needs: Carb. Mod. diet restrictions  Equipment Needs: TBD Pt/Family Agrees to Admission and willing to participate: Yes Program Orientation Provided & Reviewed with Pt/Caregiver Including Roles  & Responsibilities: Yes Additional Information Needs: Pt. with attorney for SSI/SSD  Information Needs to be Provided By: FYI for CSW and niece has been handeling   Barriers to Discharge: Home environment access/layout, Medication compliance, Decreased caregiver support  Decrease burden of Care through IP rehab admission: No  Possible need for SNF placement upon discharge: No  Patient Condition: This patient's medical and functional status has changed since the consult dated 04/03/17 in which the Rehabilitation  Physician determined and documented that the patient was potentially appropriate for intensive rehabilitative care in an inpatient rehabilitation facility. Issues have been addressed and update has been discussed with Dr. Naaman Plummer and patient now appropriate for inpatient rehabilitation. Will admit to inpatient rehab today.   Preadmission Screen Completed By:  Gunnar Fusi, 04/04/2017 2:02 PM ______________________________________________________________________   Discussed status with Dr. Naaman Plummer on 04/04/17 at 1400 and received telephone approval for admission today.  Admission Coordinator:  Gunnar Fusi, time 1400/Date 04/04/17             Cosigned by: Meredith Staggers, MD at 04/04/2017 2:40 PM  Revision History

## 2017-04-05 NOTE — Evaluation (Signed)
Physical Therapy Assessment and Plan  Patient Details  Name: Trevor [name] MRN: [id number] Date of Birth: 01-Nov-1955  PT Diagnosis: Difficulty walking, Impaired cognition and Muscle weakness Rehab Potential: Good ELOS: 8-12days    Today's Date: 04/06/2017 PT Individual Time: 800-915    75 min   Problem List:  Patient Active Problem List   Diagnosis Date Noted  . Drug induced constipation   . Stage 3 chronic kidney disease ([place])   . Bacteremia   . S/P unilateral BKA (below knee amputation), right ([place]) 04/04/2017  . PAD (peripheral artery disease) ([place])   . Type 2 diabetes mellitus with right diabetic foot ulcer ([place])   . Post-operative pain   . Legally blind   . Acute blood loss anemia   . Streptococcal bacteremia 04/01/2017  . Hypokalemia 03/30/2017  . Uncontrolled type 2 diabetes mellitus with hyperglycemia, with long-term current use of insulin ([place]) 03/30/2017  . Type 2 diabetes mellitus with peripheral neuropathy ([place]) 03/30/2017  . AKI (acute kidney injury) ([place]) 03/30/2017  . CKD stage 3 due to type 2 diabetes mellitus ([place]) 03/30/2017  . Sepsis ([place]) 03/30/2017  . Heart murmur 11/22/2016  . Hyperlipidemia associated with type 2 diabetes mellitus ([place]) 11/22/2016  . Obstructive sleep apnea syndrome 11/22/2016  . Essential hypertension 12/17/2012    Past Medical History:  Past Medical History:  Diagnosis Date  . Diabetes mellitus without complication ([place])   . Heart murmur   . Hyperlipidemia   . Hypertension   . Sleep apnea    Past Surgical History:  Past Surgical History:  Procedure Laterality Date  . AMPUTATION Right 03/31/2017   Procedure: RIGHT FOOT 1ST AND 2ND RAY AMPUTATION;  Surgeon: Newt Minion, MD;  Location: [facility];  Service: Orthopedics;  Laterality: Right;  . AMPUTATION Right 04/01/2017   Procedure: AMPUTATION BELOW KNEE;  Surgeon: Newt Minion, MD;  Location: [facility];  Service: Orthopedics;  Laterality: Right;  . CORNEAL TRANSPLANT       Assessment & Plan Clinical Impression: Patient is a 62 y.o.right-handed malewith history of diabetes mellitus, hypertension,legally blind, CKD stage III.Per chartreview and patient, patientlives with nephew in [place]. Nephew attends school and works.Family in area work.Presented 03/29/2017 with findings of osteomyelitis abscess of the right foot. 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 Sharol Given. Hospital course pain managementand transition to oral Dilaudid as needed.Wound VAC appliedfor 1 week postoperatively.Plan stump shrinker per [employer] prost[employer].Blood cultures +1 of 2 for strep VIRIDANSbacteremia and advised IV ceftezole and through 04/15/2017.Acute blood loss anemia 9.7 and monitored.  Patient transferred to CIR on 04/04/2017 .   Patient currently requires mod with mobility secondary to muscle weakness, decreased cardiorespiratoy endurance, decreased awareness, decreased problem solving, decreased safety awareness, decreased memory and delayed processing and decreased sitting balance, decreased standing balance and decreased balance strategies.  Prior to hospitalization, patient was independent  with mobility and lived with   in a House home.  Home access is  Ramped entrance.  Patient will benefit from skilled PT intervention to maximize safe functional mobility, minimize fall risk and decrease caregiver burden for planned discharge home with intermittent assist.  Anticipate patient will benefit from follow up [facility] at discharge.  PT - End of Session Activity Tolerance: Tolerates 10 - 20 min activity with multiple rests Endurance Deficit: Yes PT Assessment Rehab Potential (ACUTE/IP ONLY): Good PT Barriers to Discharge: Decreased caregiver support;Home environment access/layout;Weight;IV  antibiotics PT Patient demonstrates impairments  in the following area(s): Balance;Endurance;Motor;Nutrition;Safety;Edema;Behavior;Sensory;Skin Integrity;Pain;Perception PT Transfers Functional Problem(s): Bed Mobility;Bed to Chair;Car;Furniture;Floor PT Locomotion Functional Problem(s): Ambulation;Wheelchair Mobility;Stairs PT Plan PT Intensity: Minimum of 1-2 x/day ,45 to 90 minutes PT Frequency: 5 out of 7 days PT Duration Estimated Length of Stay: 8-12days  PT Treatment/Interventions: Ambulation/gait training;Balance/vestibular training;Cognitive remediation/compensation;Community reintegration;Discharge planning;DME/adaptive equipment instruction;Functional mobility training;Patient/family education;Pain management;Skin care/wound management;Splinting/orthotics;Stair training;Therapeutic Activities;Therapeutic Exercise;Visual/perceptual remediation/compensation;Disease management/prevention;UE/LE Coordination activities;UE/LE Strength taining/ROM;Wheelchair propulsion/positioning;Psychosocial support PT Transfers Anticipated Outcome(s): Supervision assist  PT Locomotion Anticipated Outcome(s): Mod I in [place]. Min assist ambulatory for short distances.  PT Recommendation Recommendations for Other Services: Speech consult;Neuropsych consult;Therapeutic Recreation consult Follow Up Recommendations: Home health PT Patient destination: Home Equipment Recommended: Wheelchair cushion (measurements);Wheelchair (measurements);Rolling walker with 5" wheels  Skilled Therapeutic Intervention Pt received supine in bed and agreeable to PT. Supine>sit transfer with supervision assist and min cues for safety. PT instructed patient in PT Evaluation and initiated treatment intervention; see below for results. PT educated patient in [place], rehab potential, rehab goals, and discharge recommendations. Pt instructed in WC mobility, bed mobility, and transfers as listed below. Unable to initiate gait training due to fatigue and BUE weakness. Pt returned to room and  performed SB transfer to bed with mod assist and cues for safety and technque. Sit>supine completed with supervision assist with cues to improve safety awareness and protection of RLE. Pt left supine in bed with call bell in reach and all needs met.       PT Evaluation Precautions/Restrictions Precautions Precautions: Fall Required Braces or Orthoses: Other Brace/Splint Other Brace/Splint: shrinker ordered Restrictions Weight Bearing Restrictions: Yes RLE Weight Bearing: Non weight bearing General   Vital Signs Pain Pain Assessment Pain Scale: 0-10 Pain Score: 2  Pain Location: Leg Pain Orientation: Right Pain Descriptors / Indicators: Aching Home Living/Prior Functioning Home Living Available Help at Discharge: Family;Available PRN/intermittently Type of Home: House Home Access: Ramped entrance Home Layout: One level Bathroom Shower/Tub: Chiropodist: Standard Prior Function Level of Independence: Independent with basic ADLs;Independent with homemaking with ambulation;Independent with gait  Able to Take Stairs?: Yes Driving: Yes Vocation: Part time employment 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  Perception Perception: Within Functional Limits Praxis Praxis: Intact  Cognition Overall Cognitive Status: Impaired/Different from baseline Orientation Level: Oriented X4 Memory: Impaired Memory Impairment: Decreased short term memory Awareness: Appears intact Problem Solving: Impaired Problem Solving Impairment: Functional complex;Verbal complex Safety/Judgment: Impaired Comments: new short term memory deficits.  Sensation Sensation Light Touch: Impaired by gross assessment(mild distal neuropathy in the LLE and mild numbness near incisiion site of the RLE) Proprioception: Appears Intact Coordination Gross Motor Movements are Fluid and  Coordinated: Yes Fine Motor Movements are Fluid and Coordinated: Yes Finger Nose Finger Test: Visual deficts limit smoothness Motor  Motor Motor: Other (comment) Motor - Skilled Clinical Observations: Generalized weakness  Mobility Bed Mobility Bed Mobility: Rolling Right;Rolling Left;Sit to Supine;Supine to Sit Rolling Right: 5: Supervision;5: Set up Rolling Right Details: Verbal cues for technique;Verbal cues for precautions/safety Rolling Left: 5: Supervision;5: Set up Rolling Left Details: Verbal cues for technique;Verbal cues for precautions/safety Supine to Sit: 5: Supervision Supine to Sit Details: Verbal cues for technique;Verbal cues for precautions/safety Transfers Transfers: Yes Sit to Stand: 3: Mod assist Sit to Stand Details: Manual facilitation for weight shifting;Manual facilitation for placement;Verbal cues for precautions/safety;Visual cues/gestures for sequencing;Verbal cues for sequencing Stand to Sit: 3: Mod assist  Stand to Sit Details (indicate cue type and reason): Manual facilitation for weight shifting;Manual facilitation for placement;Verbal cues for technique;Verbal cues for precautions/safety;Verbal cues for safe use of DME/AE;Verbal cues for sequencing Squat Pivot Transfers: 3: Mod assist Squat Pivot Transfer Details: Verbal cues for technique;Verbal cues for precautions/safety;Verbal cues for safe use of DME/AE;Manual facilitation for weight shifting;Manual facilitation for placement Lateral/Scoot Transfers: 4: Min assist Lateral/Scoot Transfer Details: Verbal cues for technique;Verbal cues for precautions/safety;Verbal cues for safe use of DME/AE;Manual facilitation for weight shifting;Manual facilitation for placement Locomotion  Ambulation Ambulation: No Gait Gait: No Stairs / Additional Locomotion Stairs: No Wheelchair Mobility Wheelchair Mobility: Yes Wheelchair Assistance: 5: Supervision Wheelchair Assistance Details: Verbal cues for  technique;Verbal cues for safe use of DME/AE Wheelchair Propulsion: Both upper extremities Distance: 190f  Trunk/Postural Assessment  Cervical Assessment Cervical Assessment: Within Functional Limits Thoracic Assessment Thoracic Assessment: Within Functional Limits Lumbar Assessment Lumbar Assessment: Exceptions to WFL(decreased lorosis) Postural Control Postural Control: Within Functional Limits  Balance Balance Balance Assessed: Yes Static Sitting Balance Static Sitting - Level of Assistance: 6: Modified independent (Device/Increase time) Dynamic Sitting Balance Dynamic Sitting - Level of Assistance: 5: Stand by assistance Static Standing Balance Static Standing - Level of Assistance: 4: Min assist Extremity Assessment      RLE Assessment RLE Assessment: Exceptions to [facility] Strength RLE Overall Strength Comments: Grossly 4/5 in hip, unable to assess Knee due to pain and limb guard LLE Assessment LLE Assessment: Within Functional Limits   See Function Navigator for Current Functional Status.   Refer to Care Plan for Long Term Goals  Recommendations for other services: Neuropsych and Therapeutic Recreation  Stress management and Outing/community reintegration  Discharge Criteria: Patient will be discharged from PT if patient refuses treatment 3 consecutive times without medical reason, if treatment goals not met, if there is a change in medical status, if patient makes no progress towards goals or if patient is discharged from hospital.  The above assessment, treatment plan, treatment alternatives and goals were discussed and mutually agreed upon: by patient  ALorie Phenix3/27/2019, 10:11 AM

## 2017-04-05 NOTE — Progress Notes (Signed)
Patient admitted at [facility] on prior shift alert oriented x4 denies pains ,Wound vac intact to right lel, Admission process reviewed with patient verbalized understanding of admission process. Call bell and bed alarm in place,Continue plan of care Trevor [name]

## 2017-04-05 NOTE — Telephone Encounter (Signed)
04/05/2017 11:32:22 AM - Novolog Vials New Med  04/05/17 I have received a pharmacy printout for New Med-Novolog Vials Inject 10 units three times a day with meals, use sliding scale as directed with meals and at bedtime. Max daily dose 70 units, 9 vials. Printed page 3 of application with New Med on top, mailing to provider Dr. Tomasa Hose @ [facility] to sign and return, will then fax to [employer] since patient already enrolled with Novo, patient id# [id number]  04/05/2017 11:31:05 AM - Levemir Vials DOSE CHANGE  04/05/17 Received a pharmacy printout for Levemir Vials-Dose Change Inject 40 units under the skin daily at bedtime, #5. -- Novo Nordisk shipped [place] 15 vials 01/27/17, patient picked up this med 02/13/17, since dose decreased [from 60 units twice daily] patient will have plenty till reorder end of April.[name]

## 2017-04-05 NOTE — Progress Notes (Signed)
Occupational Therapy Session Note  Patient Details  Name: BLESS BELSHE MRN: [id number] Date of Birth: 1955/11/02  Today's Date: 04/05/2017 OT Individual Time: 1310-1410 OT Individual Time Calculation (min): 60 min    Short Term Goals: Week 1:  OT Short Term Goal 1 (Week 1): Pt will complete LB dressing with min assist OT Short Term Goal 2 (Week 1): Pt will complete toilet transfer with min assist with RW OT Short Term Goal 3 (Week 1): Pt will complete clothing management with toileting with min assist OT Short Term Goal 4 (Week 1): Pt will complete bathing with min assist at sit > stand level  Skilled Therapeutic Interventions/Progress Updates:  Balance/vestibular training;Cognitive remediation/compensation;Discharge planning;DME/adaptive equipment instruction;Disease mangement/prevention;Functional mobility training;Pain management;Patient/family education;Psychosocial support;Self Care/advanced ADL retraining;Skin care/wound managment;Splinting/orthotics;Therapeutic Activities;Therapeutic Exercise;UE/LE Strength taining/ROM;Wheelchair propulsion/positioning   1:1 Focus on transfer training. Pt performed squat pivot transfer with min A with extra time. AT EOB pt performed sit to stands with RW for UB support. Pt required min to mod A and min A to maintain standing balance; performed 3 times. Pt also able to transition to small hops forwards and backwards. Transitioned then from EOB to w/c with min A stand step transfer. Pt does require prolonged rest breaks in between each activity. Left resting up in w/c with call bell.   Therapy Documentation Precautions:  Precautions Precautions: Fall Required Braces or Orthoses: Other Brace/Splint Other Brace/Splint: shrinker ordered Restrictions Weight Bearing Restrictions: Yes RLE Weight Bearing: Non weight bearing General:   Vital Signs: Therapy Vitals Temp: 97.6 F (36.4 C) Temp Source: Oral Pulse Rate: (!) 114 Resp: 18 BP:  125/61 Patient Position (if appropriate): Sitting Oxygen Therapy SpO2: 100 % O2 Device: Room Air Pain:   ADL:   Vision Wears Glasses: (wears contacts, has glasses here) Perception    Praxis Praxis: Intact Exercises:   Other Treatments:    See Function Navigator for Current Functional Status.   Therapy/Group: Individual Therapy  [facility] 04/05/2017, 3:36 PM

## 2017-04-05 NOTE — Progress Notes (Signed)
[place] PHYSICAL MEDICINE & REHABILITATION     PROGRESS NOTE  Subjective/Complaints:  Patient seen sitting up in bed this morning. He states he did not sleep well overnight due to frequent bowel movements secondary to having an increase in his laxatives yesterday.  ROS: Denies CP, SOB, nausea, vomiting.  Objective: Vital Signs: Blood pressure (!) 169/81, pulse (!) 114, temperature 98 F (36.7 C), temperature source Oral, resp. rate 17, weight (!) 136.9 kg (301 lb 13 oz), SpO2 98 %. [place] Ekg Site Rite  Result Date: 04/03/2017 If Site Rite image not attached, placement could not be confirmed due to current cardiac rhythm.  No results for input(s): WBC, HGB, HCT, PLT in the last 72 hours. Recent Labs    04/03/17 0640 04/04/17 0557  NA 136 138  K 3.6 3.7  CL 105 106  GLUCOSE 220* 168*  BUN 19 21*  CREATININE 1.30* 1.17  CALCIUM 8.1* 8.0*   CBG (last 3)  Recent Labs    04/04/17 1621 04/04/17 2156 04/05/17 0659  GLUCAP 155* 169* 167*    Wt Readings from Last 3 Encounters:  04/04/17 (!) 136.9 kg (301 lb 13 oz)  03/31/17 134.7 kg (296 lb 15.4 oz)  11/22/16 (!) 148.3 kg (327 lb)    Physical Exam:  BP (!) 169/81 (BP Location: Left Arm)   Pulse (!) 114   Temp 98 F (36.7 C) (Oral)   Resp 17   Wt (!) 136.9 kg (301 lb 13 oz)   SpO2 98%   BMI 36.74 kg/m  Constitutional: NAD. Well-developed. Obese HENT: Normocephalicand atraumatic.  Eyes:EOMare normal. No discharge.Vision limited but functional. Cardiovascular:+ tachycardia. Regular rhythm.  Respiratory:Effort normal. Norespiratory distress.  GI: Bowel sounds are normal. He exhibitsno distension. Musc: Left stump TTP Neurological. Patient is alert and oriented Follows simple commands.  Bilateral upper extremities, LLE 5/5prox to distal.  RLE: HF 4/5 Skin. 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 Psych: pleasant and appropriate  Assessment/Plan: 1. Functional deficits secondary to right BKA which  require 3+ hours per day of interdisciplinary therapy in a comprehensive inpatient rehab setting. Physiatrist is providing close team supervision and 24 hour management of active medical problems listed below. Physiatrist and rehab team continue to assess barriers to discharge/monitor patient progress toward functional and medical goals.  Function:  Bathing Bathing position      Bathing parts      Bathing assist        Upper Body Dressing/Undressing Upper body dressing                    Upper body assist        Lower Body Dressing/Undressing Lower body dressing                                  Lower body assist        Toileting Toileting          Toileting assist     Transfers Chair/bed transfer             Locomotion Ambulation           Wheelchair          Cognition Comprehension    Expression    Social Interaction    Problem Solving    Memory      Medical Problem List and Plan: 1.Decreased functional mobilitysecondary to 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    Begin  CIR 2. DVT Prophylaxis/Anticoagulation: Subcutaneous Lovenox. Monitor for any bleeding episodes 3. Pain Management:Percocet every 6 hours and Robaxin as needed 4. Mood:Provide emotional support 5. Neuropsych: This patientiscapable of making decisions on hisown behalf. 6. Skin/Wound Care:Routine skin checks   Plan to D/c Vac on 3/30 7. Fluids/Electrolytes/Nutrition:Routine I&O's  8.Acute blood loss anemia.    Hemoglobin 9.7 on 3/24, labs pending for today 9.ID.Blood cultures 1 of 2 viridans strep bacteremia.  Plan to continue Ancef through 04/15/2017 10.Hypertension. Cozaar 100 mg daily, nifedipine 90 mg daily, HCTZ 12.5 mg daily.    Monitor with increased mobility 11.Diabetes mellitus with peripheral neuropathy. Hemoglobin A1c 12.5. Levemir 40units nightly, NovoLog 8 units 3 times daily. Check blood sugars before meals and at  bedtime. Diabetic teaching   Monitor with increased mobility 12.CKD III.    Creatinine 1.7 on 3/26, labs pending for today 13.Legally blind. Patient has adapted well to home environment 14.Constipation due to drugs. Laxative assist  LOS (Days) 1 A FACE TO FACE EVALUATION WAS PERFORMED  Trevor [name] Lorie Phenix 04/05/2017 8:43 AM

## 2017-04-05 NOTE — Progress Notes (Signed)
Physical Medicine and Rehabilitation Consult Reason for Consult: Decreased functional mobility Referring Physician: Internal medicine   HPI: Trevor [name] is a 62 y.o. right-handed male with history of diabetes mellitus, hypertension,legally blind. Per chart review and patient, patient lives with nephew in [place]. Nephew attends school and works. Family in area work. Presented 03/29/2017 with findings of osteomyelitis abscess of the right foot.  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 Sharol Given.  Hospital course pain management.  Acute blood loss anemia 9.7 and monitored.SQ lovenox for DVT prophylaxis.  Physical therapy evaluation completed with recommendations of physical medicine rehab consult.  Review of Systems  Constitutional: Negative for chills and fever.  HENT: Negative for hearing loss.   Eyes: Negative for blurred vision and double vision.       Legally blind  Respiratory: Negative for cough and shortness of breath.   Cardiovascular: Negative for chest pain and palpitations.  Gastrointestinal: Positive for constipation. Negative for nausea and vomiting.  Genitourinary: Negative for dysuria, flank pain and hematuria.  Musculoskeletal: Positive for joint pain and myalgias.  All other systems reviewed and are negative.      Past Medical History:  Diagnosis Date  . Diabetes mellitus without complication ([place])   . Heart murmur   . Hyperlipidemia   . Hypertension   . Sleep apnea         Past Surgical History:  Procedure Laterality Date  . AMPUTATION Right 03/31/2017   Procedure: RIGHT FOOT 1ST AND 2ND RAY AMPUTATION;  Surgeon: Newt Minion, MD;  Location: [place];  Service: Orthopedics;  Laterality: Right;  . AMPUTATION Right 04/01/2017   Procedure: AMPUTATION BELOW KNEE;  Surgeon: Newt Minion, MD;  Location: [place];   Service: Orthopedics;  Laterality: Right;  . CORNEAL TRANSPLANT     No pertinent family history of amputation. Social History:  reports that he has never smoked. He has never used smokeless tobacco. He reports that he does not drink alcohol or use drugs. Allergies:      Allergies  Allergen Reactions  . Enalapril Cough   Medications Prior to Admission  Medication Sig Dispense Refill  . atenolol (TENORMIN) 100 MG tablet Take 100 mg by mouth daily.    . cyclobenzaprine (FLEXERIL) 10 MG tablet Take 10 mg by mouth 3 (three) times daily as needed for muscle spasms.    . fluticasone (FLONASE) 50 MCG/ACT nasal spray Place 2 sprays into both nostrils daily.    . furosemide (LASIX) 40 MG tablet Take 40 mg by mouth as needed.    [name] glipiZIDE (GLUCOTROL) 10 MG tablet Take 10 mg by mouth 2 (two) times daily.    . insulin detemir (LEVEMIR) 100 UNIT/ML injection Inject 60 Units into the skin 2 (two) times daily.    [name] losartan (COZAAR) 25 MG tablet Take 100 mg by mouth daily.    . meloxicam (MOBIC) 15 MG tablet Take 15 mg by mouth daily.    . metFORMIN (GLUCOPHAGE) 1000 MG tablet Take 1,000 mg by mouth 2 (two) times daily.    [name] NIFEdipine (PROCARDIA XL/ADALAT-CC) 90 MG 24 hr tablet Take 90 mg by mouth daily.    . potassium chloride SA (K-DUR,KLOR-CON) 20 MEQ tablet Take 1 tablet (20 mEq total) 2 (two) times daily by mouth. 60 tablet 3  . silver sulfADIAZINE (SILVADENE) 1 % cream Apply 1 application topically daily.    [name]  glucose blood (TRUE METRIX BLOOD GLUCOSE TEST) test strip Use as instructed (Patient not taking: Reported on 03/29/2017) 100 each 12    Home: [place] expects to be discharged to:: Private residence Living Arrangements: Other relatives Available Help at Discharge: Family, Available PRN/intermittently Type of Home: House Home Access: Ramped entrance Dietrich: One level Bathroom Shower/Tub: Chiropodist:  Standard Home Equipment: None  Functional History: Prior Function Level of Independence: Independent Functional Status:  Mobility: Bed Mobility Overal bed mobility: Needs Assistance Bed Mobility: Supine to Sit, Sit to Supine Supine to sit: Min guard, HOB elevated Sit to supine: Min guard, HOB elevated General bed mobility comments: +rail, increased time and effort, verbal cues for sequencing  Transfers General transfer comment: unable to progress beyond EOB due to pain/pt refusal Ambulation/Gait General Gait Details: unable  ADL:  Cognition: Cognition Overall Cognitive Status: Within Functional Limits for tasks assessed Orientation Level: Oriented X4 Cognition Arousal/Alertness: Awake/alert Behavior During Therapy: WFL for tasks assessed/performed Overall Cognitive Status: Within Functional Limits for tasks assessed  Blood pressure (!) 154/100, pulse 100, temperature 98.1 F (36.7 C), temperature source Oral, resp. rate 16, height 6\' 4"  (1.93 m), weight 134.7 kg (296 lb 15.4 oz), SpO2 98 %. Physical Exam  Vitals reviewed. Constitutional: He is oriented to person, place, and time. He appears well-developed.  Obese  HENT:  Head: Normocephalic and atraumatic.  Eyes: EOM are normal.  Pupils sluggish to light  Neck: Normal range of motion. Neck supple. No thyromegaly present.  Cardiovascular: Regular rhythm and normal heart sounds.  +Tachycardia  Respiratory: Effort normal and breath sounds normal. No respiratory distress.  GI: Soft. Bowel sounds are normal. He exhibits no distension.  Musculoskeletal:  Right stump TTP  Neurological: He is alert and oriented to person, place, and time.  Motor: B/l UE, LLE 5/5 proximal to distal RLE: HF 4/5 (pain inhibition)  Skin:  Right AKA with VAC and appropriately tender  Psychiatric: His affect is blunt.  Assessment/Plan: Diagnosis: Right BKA Labs independently reviewed.  Records reviewed and summated above. Clean amputation  daily with soap and water Monitor incision site for signs of infection or impending skin breakdown. Staples to remain in place for 3-4 weeks Stump shrinker, for edema control  Scar mobilization massaging to prevent soft tissue adherence Stump protector during therapies Prevent flexion contractures by implementing the following:              Encourage prone lying for 20-30 mins per day BID to avoid hip flexion       Contractures if medically appropriate;             Avoid pillow under knees when patient is lying in bed in order to prevent both        knee and hip flexion contractures;             Avoid prolonged sitting Post surgical pain control with oral medication Phantom limb pain control with physical modalities including desensitization techniques (gentle self massage to the residual stump,hot packs if sensation intact, [place]) and mirror therapy, TENS. If ineffective, consider pharmacological treatment for neuropathic pain (e.g gabapentin, pregabalin, amytriptalyine, duloxetine).  When using wheelchair, patient should have knee on amputated side fully extended with board under the seat cushion. Avoid injury to contralateral side  1. Does the need for close, 24 hr/day medical supervision in concert with the patient's rehab needs make it unreasonable for this patient to be served in a less intensive setting? Potentially  2. Co-Morbidities requiring supervision/potential complications: diabetes mellitus (Monitor in accordance with exercise and adjust meds as necessary), HTN (monitor and provide prns in accordance with increased physical exertion and pain), legally blind, post-op pain management (Biofeedback training with therapies to help reduce reliance on opiate pain medications, particularly IV dilaudid, monitor pain control during therapies, and sedation at rest and titrate to maximum efficacy to ensure participation and gains in therapies), Acute blood loss anemia (transfuse if necessary to  ensure appropriate perfusion for increased activity tolerance), Tachycardia (monitor in accordance with pain and increasing, CKD (avoid nephrotoxic meds) 3. Due to safety, skin/wound care, disease management, pain management and patient education, does the patient require 24 hr/day rehab nursing? Potentially 4. Does the patient require coordinated care of a physician, rehab nurse, PT (1-2 hrs/day, 5 days/week) and OT (1-2 hrs/day, 5 days/week) to address physical and functional deficits in the context of the above medical diagnosis(es)? Potentially Addressing deficits in the following areas: balance, endurance, locomotion, strength, bathing, dressing, toileting and psychosocial support 5. Can the patient actively participate in an intensive therapy program of at least 3 hrs of therapy per day at least 5 days per week? Yes 6. The potential for patient to make measurable gains while on inpatient rehab is good and fair 7. Anticipated functional outcomes upon discharge from inpatient rehab are modified independent  with PT, modified independent with OT, n/a with SLP. 8. Estimated rehab length of stay to reach the above functional goals is: 3-5 days. 9. Anticipated D/C setting: Home 10. Anticipated post D/C treatments: HH therapy and Home excercise program 11. Overall Rehab/Functional Prognosis: good  RECOMMENDATIONS: This patient's condition is appropriate for continued rehabilitative care in the following setting: Anticipate pt will not require CIR once pain better controlled.  Recommend home with HH. Further, pt would still like to consider if he wants CIR, but not today, as he prefers no one else speak to him today regarding rehab.   Patient has agreed to participate in recommended program. Potentially Note that insurance prior authorization may be required for reimbursement for recommended care.  Comment: Rehab Admissions Coordinator to follow up tomorrow.  Delice Lesch, MD, ABPMR Lavon Paganini  Angiulli, PA-C 04/03/2017          Revision History                        Routing History

## 2017-04-05 NOTE — Evaluation (Signed)
Occupational Therapy Assessment and Plan  Patient Details  Name: Trevor [name] MRN: [id number] Date of Birth: 07/23/1955  OT Diagnosis: acute pain, cognitive deficits and muscle weakness (generalized) Rehab Potential: Rehab Potential (ACUTE ONLY): Good ELOS: 8-12 days   Today's Date: 04/05/2017 OT Individual Time: 6144-3154 OT Individual Time Calculation (min): 80 min     Problem List:  Patient Active Problem List   Diagnosis Date Noted  . Drug induced constipation   . Stage 3 chronic kidney disease ([place])   . Bacteremia   . S/P unilateral BKA (below knee amputation), right ([place]) 04/04/2017  . PAD (peripheral artery disease) ([place])   . Type 2 diabetes mellitus with right diabetic foot ulcer ([place])   . Post-operative pain   . Legally blind   . Acute blood loss anemia   . Streptococcal bacteremia 04/01/2017  . Hypokalemia 03/30/2017  . Uncontrolled type 2 diabetes mellitus with hyperglycemia, with long-term current use of insulin ([place]) 03/30/2017  . Type 2 diabetes mellitus with peripheral neuropathy ([place]) 03/30/2017  . AKI (acute kidney injury) ([place]) 03/30/2017  . CKD stage 3 due to type 2 diabetes mellitus ([place]) 03/30/2017  . Sepsis ([place]) 03/30/2017  . Heart murmur 11/22/2016  . Hyperlipidemia associated with type 2 diabetes mellitus ([place]) 11/22/2016  . Obstructive sleep apnea syndrome 11/22/2016  . Essential hypertension 12/17/2012    Past Medical History:  Past Medical History:  Diagnosis Date  . Diabetes mellitus without complication ([place])   . Heart murmur   . Hyperlipidemia   . Hypertension   . Sleep apnea    Past Surgical History:  Past Surgical History:  Procedure Laterality Date  . AMPUTATION Right 03/31/2017   Procedure: RIGHT FOOT 1ST AND 2ND RAY AMPUTATION;  Surgeon: Newt Minion, MD;  Location: [facility];  Service: Orthopedics;  Laterality: Right;  . AMPUTATION Right 04/01/2017   Procedure: AMPUTATION BELOW KNEE;  Surgeon: Newt Minion, MD;  Location: [facility];  Service: Orthopedics;  Laterality: Right;  . CORNEAL TRANSPLANT      Assessment & Plan Clinical Impression: Patient is a 62 y.o. right-handed malewith history of diabetes mellitus, hypertension,legally blind, CKD stage III.Per chartreview and patient, patientlives with nephew in [place]. Nephew attends school and works.Family in area work.Presented 03/29/2017 with findings of osteomyelitis abscess of the right foot. 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 Sharol Given. Hospital course pain managementand transition to oral Dilaudid as needed.Wound VAC appliedfor 1 week postoperatively.Plan stump shrinker per [employer] prosthetics.Blood cultures +1 of 2 for strep VIRIDANSbacteremia and advised IV ceftezole and through 04/15/2017.Acute blood loss anemia 9.7 and monitored.SQ lovenox forDVT prophylaxis.Creatinine stable with history of CKD stage III latest creatinine 1.80fom baseline 2.06Physical therapy evaluation completed with recommendations of physical medicine rehab consult.Patient was admitted for a comprehensive rehab program.   Patient transferred to CIR on 04/04/2017 .    Patient currently requires max with basic self-care skills secondary to muscle weakness, decreased cardiorespiratoy endurance, decreased awareness, decreased problem solving, decreased safety awareness, decreased memory and delayed processing and decreased standing balance and decreased balance strategies.  Prior to hospitalization, patient could complete ADLs with independent .  Patient will benefit from skilled intervention to decrease level of assist with basic self-care skills and increase independence with basic self-care skills prior to discharge home with care partner.  Anticipate patient will require intermittent supervision and follow up home health.  OT - End  of Session Activity Tolerance: Tolerates 30+ min activity with multiple rests Endurance Deficit: Yes Endurance Deficit Description: required frequent rest breaks OT Assessment Rehab Potential (ACUTE ONLY): Good OT Barriers to Discharge: Decreased caregiver support;Home environment access/layout OT Patient demonstrates impairments in the following area(s): Balance;Cognition;Endurance;Motor;Pain;Safety OT Basic ADL's Functional Problem(s): Grooming;Dressing;Bathing;Toileting OT Advanced ADL's Functional Problem(s): Simple Meal Preparation OT Transfers Functional Problem(s): Toilet;Tub/Shower OT Plan OT Intensity: Minimum of 1-2 x/day, 45 to 90 minutes OT Frequency: 5 out of 7 days OT Duration/Estimated Length of Stay: 8-12 days OT Treatment/Interventions: Balance/vestibular training;Cognitive remediation/compensation;Discharge planning;DME/adaptive equipment instruction;Disease mangement/prevention;Functional mobility training;Pain management;Patient/family education;Psychosocial support;Self Care/advanced ADL retraining;Skin care/wound managment;Splinting/orthotics;Therapeutic Activities;Therapeutic Exercise;UE/LE Strength taining/ROM;Wheelchair propulsion/positioning OT Basic Self-Care Anticipated Outcome(s): Mod I OT Toileting Anticipated Outcome(s): Mod I OT Bathroom Transfers Anticipated Outcome(s): Mod I - supervision shower transfers   Skilled Therapeutic Intervention OT eval completed with discussion of rehab process, OT purpose, POC, ELOS, and goals.  ADL assessment completed with bathing seated at EOB with lateral leans for hygiene and LB dressing at sit > stand level.  Mod-max assist sit > stand, requiring +2 for clothing management due to fatigue and decreased dynamic standing balance.  Utilized slide board for transfer bed > w/c with min-mod assist, pt demonstrating good weight shifting and ability to lift buttocks during transitional movement.  Question pt's cognition as he was  often slow with word finding and pt's family reports cognition is different from baseline.  OT Evaluation Precautions/Restrictions  Precautions Precautions: Fall Required Braces or Orthoses: Other Brace/Splint Other Brace/Splint: shrinker ordered Restrictions Weight Bearing Restrictions: Yes RLE Weight Bearing: Non weight bearing Pain Pain Assessment Pain Scale: 0-10 Pain Score: 2  Pain Location: Leg Pain Orientation: Right Pain Descriptors / Indicators: Aching Home Living/Prior Functioning Home Living Available Help at Discharge: Family, Available PRN/intermittently Type of Home: House Home Access: Ramped entrance Home Layout: One level Bathroom Shower/Tub: Tub/shower unit, Architectural technologist: Standard  Lives With: Family(nephew) IADL History Homemaking Responsibilities: Yes Meal Prep Responsibility: Primary Laundry Responsibility: Primary Cleaning Responsibility: Primary Prior Function Level of Independence: Independent with basic ADLs, Independent with homemaking with ambulation, Independent with gait  Able to Take Stairs?: Yes Driving: Yes Vocation: Part time employment 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  Perception  Perception: Within Functional Limits Praxis Praxis: Intact Cognition Overall Cognitive Status: Impaired/Different from baseline Arousal/Alertness: Awake/alert Orientation Level: Person;Place;Situation Person: Oriented Place: Oriented Situation: Oriented Year: 2019 Month: March Day of Week: Incorrect(Thursday) Memory: Impaired Memory Impairment: Decreased short term memory Immediate Memory Recall: Sock;Blue;Bed Memory Recall: Sock;Blue Memory  Recall Sock: Without Cue Memory Recall Blue: Without Cue Awareness: Appears intact Problem Solving: Impaired Problem Solving Impairment: Functional complex;Verbal complex Safety/Judgment: Impaired Comments: new short term memory deficits and difficulty with word finding Sensation Sensation Light Touch: Impaired by gross assessment(mild distal neuropathy in the LLE and mild numbness near incisiion site of the RLE) Proprioception: Appears Intact Coordination Gross Motor Movements are Fluid and Coordinated: Yes Fine Motor Movements are Fluid and Coordinated: Yes Finger Nose Finger Test: Visual deficts limit smoothness Motor  Motor Motor: Other (comment) Motor - Skilled Clinical Observations: Generalized weakness Mobility  Bed Mobility Bed Mobility: Rolling Right;Rolling Left;Sit to Supine;Supine to Sit Rolling Right: 5: Supervision;5: Set up Rolling Right Details: Verbal cues for technique;Verbal cues for precautions/safety Rolling  Left: 5: Supervision;5: Set up Rolling Left Details: Verbal cues for technique;Verbal cues for precautions/safety Supine to Sit: 5: Supervision Supine to Sit Details: Verbal cues for technique;Verbal cues for precautions/safety Transfers Sit to Stand: 3: Mod assist Sit to Stand Details: Manual facilitation for weight shifting;Manual facilitation for placement;Verbal cues for precautions/safety;Visual cues/gestures for sequencing;Verbal cues for sequencing Stand to Sit: 3: Mod assist Stand to Sit Details (indicate cue type and reason): Manual facilitation for weight shifting;Manual facilitation for placement;Verbal cues for technique;Verbal cues for precautions/safety;Verbal cues for safe use of DME/AE;Verbal cues for sequencing  Trunk/Postural Assessment  Cervical Assessment Cervical Assessment: Within Functional Limits Thoracic Assessment Thoracic Assessment: Within Functional Limits Lumbar Assessment Lumbar Assessment: Exceptions to WFL(decreased  lorosis) Postural Control Postural Control: Within Functional Limits  Balance Balance Balance Assessed: Yes Static Sitting Balance Static Sitting - Level of Assistance: 6: Modified independent (Device/Increase time) Dynamic Sitting Balance Dynamic Sitting - Level of Assistance: 5: Stand by assistance Static Standing Balance Static Standing - Level of Assistance: 4: Min assist Extremity/Trunk Assessment RUE Assessment RUE Assessment: Within Functional Limits LUE Assessment LUE Assessment: Within Functional Limits   See Function Navigator for Current Functional Status.   Refer to Care Plan for Long Term Goals  Recommendations for other services: None    Discharge Criteria: Patient will be discharged from OT if patient refuses treatment 3 consecutive times without medical reason, if treatment goals not met, if there is a change in medical status, if patient makes no progress towards goals or if patient is discharged from hospital.  The above assessment, treatment plan, treatment alternatives and goals were discussed and mutually agreed upon: by patient and by family  Ellwood Dense [facility] 04/05/2017, 12:33 PM

## 2017-04-06 ENCOUNTER — Inpatient Hospital Stay (HOSPITAL_COMMUNITY): Payer: Self-pay | Admitting: Occupational Therapy

## 2017-04-06 ENCOUNTER — Inpatient Hospital Stay (HOSPITAL_COMMUNITY): Payer: Self-pay | Admitting: Physical Therapy

## 2017-04-06 DIAGNOSIS — G8918 Other acute postprocedural pain: Secondary | ICD-10-CM

## 2017-04-06 DIAGNOSIS — D72829 Elevated white blood cell count, unspecified: Secondary | ICD-10-CM

## 2017-04-06 LAB — GLUCOSE, CAPILLARY
GLUCOSE-CAPILLARY: 125 mg/dL — AB (ref 65–99)
GLUCOSE-CAPILLARY: 175 mg/dL — AB (ref 65–99)
Glucose-Capillary: 147 mg/dL — ABNORMAL HIGH (ref 65–99)
Glucose-Capillary: 303 mg/dL — ABNORMAL HIGH (ref 65–99)

## 2017-04-06 LAB — CBC
HEMATOCRIT: 25.6 % — AB (ref 39.0–52.0)
Hemoglobin: 8.2 g/dL — ABNORMAL LOW (ref 13.0–17.0)
MCH: 25.8 pg — ABNORMAL LOW (ref 26.0–34.0)
MCHC: 32 g/dL (ref 30.0–36.0)
MCV: 80.5 fL (ref 78.0–100.0)
Platelets: 582 10*3/uL — ABNORMAL HIGH (ref 150–400)
RBC: 3.18 MIL/uL — ABNORMAL LOW (ref 4.22–5.81)
RDW: 13.8 % (ref 11.5–15.5)
WBC: 14 10*3/uL — ABNORMAL HIGH (ref 4.0–10.5)

## 2017-04-06 LAB — CULTURE, BLOOD (ROUTINE X 2)
Culture: NO GROWTH
Culture: NO GROWTH
Special Requests: ADEQUATE
Special Requests: ADEQUATE

## 2017-04-06 LAB — AEROBIC/ANAEROBIC CULTURE (SURGICAL/DEEP WOUND)

## 2017-04-06 LAB — AEROBIC/ANAEROBIC CULTURE W GRAM STAIN (SURGICAL/DEEP WOUND)

## 2017-04-06 LAB — OCCULT BLOOD X 1 CARD TO LAB, STOOL: FECAL OCCULT BLD: POSITIVE — AB

## 2017-04-06 MED ORDER — PANTOPRAZOLE SODIUM 40 MG PO TBEC
40.0000 mg | DELAYED_RELEASE_TABLET | Freq: Two times a day (BID) | ORAL | Status: DC
  Administered 2017-04-06 – 2017-04-08 (×5): 40 mg via ORAL
  Filled 2017-04-06 (×5): qty 1

## 2017-04-06 NOTE — Progress Notes (Addendum)
Occupational Therapy Session Note  Patient Details  Name: Trevor [name] MRN: [id number] Date of Birth: [birth year]  Today's Date: 04/06/2017 OT Individual Time: 6153-7943 OT Individual Time Calculation (min): 75 min    Short Term Goals: Week 1:  OT Short Term Goal 1 (Week 1): Pt will complete LB dressing with min assist OT Short Term Goal 2 (Week 1): Pt will complete toilet transfer with min assist with RW OT Short Term Goal 3 (Week 1): Pt will complete clothing management with toileting with min assist OT Short Term Goal 4 (Week 1): Pt will complete bathing with min assist at sit > stand level  Skilled Therapeutic Interventions/Progress Updates:    Pt presents supine in bed agreeable to OT tx session. No c/o pain. Pt completing bed mobility to sit EOB with overall supervision. Focus of session on bathing/dressing ADLs completed while sitting EOB. Pt washing UB with setup assist, washes LB with close minguard for safety; with cues pt utilizing lateral leans to wash buttocks. Pt dons overhead shirt with setup assist, though found shirt to be dirty, trialed paper scrub top however pt ultimately most comfortable wearing hospital gown. Pt dons underwear and pants with overall modA to thread over LEs, pt using lateral leans sitting EOB to advance clothing over hips with increased time/effort, assist provided for footwear. Pt with increased fatigue after ADL completion and requiring intermittent rest breaks. Pt reporting experiencing discomfort yesterday with sitting on current w/c cushion. Obtained new w/c cushion to trial while OOB today. Pt completed squat pivot transfer EOB>w/c to the L with verbal cues for technique and overall MinA. Pt does require increased time to process instructions and to verbalize needs during session. Pt left seated in w/c end of session, call bell and needs within reach, RN present for labwork.   Therapy Documentation Precautions:  Precautions Precautions:  Fall Required Braces or Orthoses: Other Brace/Splint Other Brace/Splint: shrinker ordered Restrictions Weight Bearing Restrictions: Yes RLE Weight Bearing: Non weight bearing  See Function Navigator for Current Functional Status.   Therapy/Group: Individual Therapy  Raymondo Band 04/06/2017, 8:14 AM

## 2017-04-06 NOTE — Progress Notes (Signed)
Occupational Therapy Session Note  Patient Details  Name: Trevor [name] MRN: [id number] Date of Birth: 1955-06-10  Today's Date: 04/06/2017 OT Individual Time: 1310-1405 OT Individual Time Calculation (min): 55 min    Short Term Goals: Week 1:  OT Short Term Goal 1 (Week 1): Pt will complete LB dressing with min assist OT Short Term Goal 2 (Week 1): Pt will complete toilet transfer with min assist with RW OT Short Term Goal 3 (Week 1): Pt will complete clothing management with toileting with min assist OT Short Term Goal 4 (Week 1): Pt will complete bathing with min assist at sit > stand level  Skilled Therapeutic Interventions/Progress Updates:    Treatment session with focus on BUE strengthening, endurance, and sit > stand. Pt received upright in w/c with niece present - but leaving to go to school.  Engaged in sit > stand with min assist and stand step transfer to therapy mat with RW with min assist. Engaged in BUE strengthening and endurance with 4# dowel rod completing 3 sets of 10 chest presses, bicep curls, and overhead presses with prolonged rest break between each set.  Utilized 4# medicine ball completing 3 sets of 10 PNF pattern diagonals in sitting for BUE strengthening.  3 sets of 10 tricep/chair pushups to increase strength and independence with sit > stand.  Upon attempt to stand from therapy mat at end of session, pt required mod assist due to fatigue from BUE strengthening exercises.  Once upright, min assist stand pivot transfer with RW back to w/c.  Pt continued to demonstrate slowed speech with increased time for word finding, but was overall appropriate and able to recall therapists' names and a few activities completed during treatment sessions.  Therapy Documentation Precautions:  Precautions Precautions: Fall Required Braces or Orthoses: Other Brace/Splint Other Brace/Splint: shrinker ordered Restrictions Weight Bearing Restrictions: Yes RLE Weight Bearing: Non  weight bearing Pain:  Pt with no c/o pain  See Function Navigator for Current Functional Status.   Therapy/Group: Individual Therapy  Simonne Come 04/06/2017, 2:21 PM

## 2017-04-06 NOTE — Discharge Instructions (Signed)
Inpatient Rehab Discharge Instructions  Trevor [name] Discharge date and time:    Activities/Precautions/ Functional Status: Activity: activity as tolerated Diet: diabetic diet Wound Care: keep wound clean and dry   Functional status:  ___ No restrictions     ___ Walk up steps independently ___ 24/7 supervision/assistance   ___ Walk up steps with assistance ___ Intermittent supervision/assistance  ___ Bathe/dress independently ___ Walk with walker     _x__ Bathe/dress with assistance ___ Walk Independently    ___ Shower independently ___ Walk with assistance    ___ Shower with assistance ___ No alcohol     ___ Return to work/school ________  Special Instructions:    My questions have been answered and I understand these instructions. I will adhere to these goals and the provided educational materials after my discharge from the hospital.  Patient/Caregiver Signature _______________________________ Date __________  Clinician Signature _______________________________________ Date __________  Please bring this form and your medication list with you to all your follow-up doctor's appointments.

## 2017-04-06 NOTE — Progress Notes (Signed)
[facility]     PROGRESS NOTE  Subjective/Complaints:  Pt seen lying in bed this AM.  He slept fairly overnight.  He just had a bowel movement, which was noted to be black.    ROS: denies CP, SOB, nausea, vomiting.  Objective: Vital Signs: Blood pressure (!) 159/83, pulse 100, temperature 97.9 F (36.6 C), temperature source Oral, resp. rate 17, weight (!) 136.9 kg (301 lb 13 oz), SpO2 100 %. No results found. Recent Labs    04/05/17 1140  WBC 17.9*  HGB 9.2*  HCT 27.8*  PLT 588*   Recent Labs    04/04/17 0557 04/05/17 1140  NA 138 136  K 3.7 3.6  CL 106 102  GLUCOSE 168* 231*  BUN 21* 22*  CREATININE 1.17 1.25*  CALCIUM 8.0* 8.5*   CBG (last 3)  Recent Labs    04/05/17 1649 04/05/17 2115 04/06/17 0617  GLUCAP 183* 168* 125*    Wt Readings from Last 3 Encounters:  04/04/17 (!) 136.9 kg (301 lb 13 oz)  03/31/17 134.7 kg (296 lb 15.4 oz)  11/22/16 (!) 148.3 kg (327 lb)    Physical Exam:  BP (!) 159/83 (BP Location: Left Arm)   Pulse 100   Temp 97.9 F (36.6 C) (Oral)   Resp 17   Wt (!) 136.9 kg (301 lb 13 oz)   SpO2 100%   BMI 36.74 kg/m  Constitutional: NAD. Well-developed. Obese HENT: Normocephalicand atraumatic.  Eyes:EOMare normal. No discharge.Vision limited but functional. Cardiovascular:+ tachycardia. Regular rhythm.  Respiratory:Effort normal. Norespiratory distress.  GI: Bowel sounds are normal. He exhibitsno distension. Musc: Left stump TTP Neurological. Patient is alert and oriented Follows simple commands.  Bilateral upper extremities, LLE 5/5prox to distal.  RLE: HF 4/5 (stable) Skin. 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 Psych: pleasant and appropriate  Assessment/Plan: 1. Functional deficits secondary to right BKA which require 3+ hours per day of interdisciplinary therapy in a comprehensive inpatient rehab setting. Physiatrist is providing close team supervision and 24 hour  management of active medical problems listed below. Physiatrist and rehab team continue to assess barriers to discharge/monitor patient progress toward functional and medical goals.  Function:  Bathing Bathing position   Position: Sitting EOB  Bathing parts Body parts bathed by patient: Right arm, Left arm, Chest, Abdomen, Front perineal area, Right upper leg, Left upper leg Body parts bathed by helper: Buttocks, Left lower leg, Back  Bathing assist Assist Level: Touching or steadying assistance(Pt > 75%)      Upper Body Dressing/Undressing Upper body dressing   What is the patient wearing?: Pull over shirt/dress     Pull over shirt/dress - Perfomed by patient: Thread/unthread right sleeve, Thread/unthread left sleeve, Put head through opening          Upper body assist Assist Level: Set up, Touching or steadying assistance(Pt > 75%)   Set up : To obtain clothing/put away  Lower Body Dressing/Undressing Lower body dressing   What is the patient wearing?: Pants, Non-skid slipper socks     Pants- Performed by patient: Thread/unthread left pants leg Pants- Performed by helper: Thread/unthread right pants leg, Pull pants up/down Non-skid slipper socks- Performed by patient: Don/doff left sock                    Lower body assist Assist for lower body dressing: 2 Helpers      Toileting Toileting          Toileting assist  Transfers Chair/bed transfer   Chair/bed transfer method: Squat pivot, Lateral scoot Chair/bed transfer assist level: Moderate assist (Pt 50 - 74%/lift or lower) Chair/bed transfer assistive device: Armrests, Sliding board     Locomotion Ambulation Ambulation activity did not occur: Safety/medical concerns         Wheelchair   Type: Manual Max wheelchair distance: 150ft  Assist Level: Supervision or verbal cues  Cognition Comprehension Comprehension assist level: Follows basic conversation/direction with extra time/assistive  device  Expression Expression assist level: Expresses basic needs/ideas: With extra time/assistive device  Social Interaction Social Interaction assist level: Interacts appropriately 75 - 89% of the time - Needs redirection for appropriate language or to initiate interaction.  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    Continue CIR 2. DVT Prophylaxis/Anticoagulation:    Subcutaneous Lovenox DC'd on 3/28 due to black stools.    Monitor for any bleeding episodes 3. Pain Management:Percocet every 6 hours and Robaxin as needed 4. Mood:Provide emotional support 5. Neuropsych: This patientiscapable of making decisions on hisown behalf. 6. Skin/Wound Care:Routine skin checks   Plan to D/c Vac on 3/30 7. Fluids/Electrolytes/Nutrition:Routine I&O's  8.Acute blood loss anemia.    Hemoglobin 9.2 on 3/27, trending down labs ordered for today   Hemoccult ordered   PPI twice a day started on 3/28   Will discuss with GI 9.ID.Blood cultures 1 of 2 viridans strep bacteremia.  Plan to continue Ancef through 04/15/2017 10.Hypertension. Cozaar 100 mg daily, nifedipine 90 mg daily, HCTZ 12.5 mg daily.    Monitor with increased mobility 11.Diabetes mellitus with peripheral neuropathy. Hemoglobin A1c 12.5. Levemir 40units nightly, NovoLog 8 units 3 times daily. Check blood sugars before meals and at bedtime. Diabetic teaching   Labile on 3/28 12.CKD III.    Creatinine 1.25 on 3/27   Encourage fluids 13.Legally blind. Patient has adapted well to home environment 14.Constipation due to drugs. Laxative assist 15. Leukocytosis   WBCs 17.9 on 3/27, labs ordered for tomorrow    Afebrile   On IV antibiotics   UA/Ucx ordered   CXR ordered     LOS (Days) 2 A FACE TO FACE EVALUATION WAS PERFORMED  Shamir Sedlar Lorie Phenix 04/06/2017 8:41 AM

## 2017-04-06 NOTE — Consult Note (Signed)
[facility] Gastroenterology Consultation Note  Referring Provider: Dr. Delice Lesch Primary Care Physician:  [name], NP  Reason for Consultation:  Anemia, melena  HPI: Trevor [name] is a 62 y.o. male admitted to rehab from hospital after osteomyelitis requiring right below knee amputation.  Is on enoxaparin for DVT prophylaxis; no prior anticoagulants.  Hgb slow downtrend over past several days.  Not on PPI previously just started today.  Reports of possible dark stools; hemoccult-positive.  Prior issues pre-admission from constipation, but controlled at present.  No prior known GI bleeding.  No prior endoscopy or colonoscopy.   Past Medical History:  Diagnosis Date  . Diabetes mellitus without complication ([place])   . Heart murmur   . Hyperlipidemia   . Hypertension   . Sleep apnea     Past Surgical History:  Procedure Laterality Date  . AMPUTATION Right 03/31/2017   Procedure: RIGHT FOOT 1ST AND 2ND RAY AMPUTATION;  Surgeon: Newt Minion, MD;  Location: [place];  Service: Orthopedics;  Laterality: Right;  . AMPUTATION Right 04/01/2017   Procedure: AMPUTATION BELOW KNEE;  Surgeon: Newt Minion, MD;  Location: [place];  Service: Orthopedics;  Laterality: Right;  . CORNEAL TRANSPLANT      Prior to Admission medications   Medication Sig Start Date End Date Taking? Authorizing Provider  ceFAZolin (ANCEF) 2-4 GM/100ML-% IVPB Inject 100 mLs (2 g total) into the vein every 6 (six) hours for 11 days. 04/04/17 04/15/17  Colbert Ewing, MD  cyclobenzaprine (FLEXERIL) 10 MG tablet Take 10 mg by mouth 3 (three) times daily as needed for muscle spasms.    [provider]  fluticasone (FLONASE) 50 MCG/ACT nasal spray Place 2 sprays into both nostrils daily.    [provider]  glucose blood (TRUE METRIX BLOOD GLUCOSE TEST) test strip Use as instructed Patient not taking: Reported on 03/29/2017 11/22/16   [name], NP  guaiFENesin (MUCINEX) 600 MG 12 hr tablet Take 1  tablet (600 mg total) by mouth 2 (two) times daily as needed for cough or to loosen phlegm. 04/04/17   Colbert Ewing, MD  hydrochlorothiazide (MICROZIDE) 12.5 MG capsule Take 1 capsule (12.5 mg total) by mouth daily. 04/05/17   Colbert Ewing, MD  insulin aspart (NOVOLOG) 100 UNIT/ML injection Inject 0-20 Units into the skin 3 (three) times daily with meals. 04/04/17   Colbert Ewing, MD  insulin aspart (NOVOLOG) 100 UNIT/ML injection Inject 0-5 Units into the skin at bedtime. 04/04/17   Colbert Ewing, MD  insulin aspart (NOVOLOG) 100 UNIT/ML injection Inject 10 Units into the skin 3 (three) times daily with meals. 04/04/17   Colbert Ewing, MD  insulin detemir (LEVEMIR) 100 UNIT/ML injection Inject 0.4 mLs (40 Units total) into the skin at bedtime. 04/04/17   Colbert Ewing, MD  losartan (COZAAR) 25 MG tablet Take 100 mg by mouth daily.    [provider]  metFORMIN (GLUCOPHAGE) 1000 MG tablet Take 1,000 mg by mouth 2 (two) times daily.    [provider]  NIFEdipine (PROCARDIA XL/ADALAT-CC) 90 MG 24 hr tablet Take 1 tablet (90 mg total) by mouth daily. 04/05/17   Colbert Ewing, MD  oxyCODONE-acetaminophen (PERCOCET/ROXICET) 5-325 MG tablet Take 1-2 tablets by mouth every 6 (six) hours as needed for moderate pain or severe pain. 04/04/17   Colbert Ewing, MD  polyethylene glycol [facility] / Floria Raveling) packet Take 17 g by mouth daily as needed for mild constipation. 04/04/17   Colbert Ewing, MD  senna-docusate (SENOKOT-S) 8.6-50 MG  tablet Take 1 tablet by mouth at bedtime. 04/04/17   Colbert Ewing, MD    Current Facility-Administered Medications  Medication Dose Route Frequency Provider Last Rate Last Dose  . acetaminophen (TYLENOL) tablet 650 mg  650 mg Oral Q6H PRN Cathlyn Parsons, PA-C   650 mg at 04/05/17 2344   Or  . acetaminophen (TYLENOL) suppository 650 mg  650 mg Rectal Q6H PRN Angiulli, Lavon Paganini, PA-C      . alum & mag hydroxide-simeth (MAALOX/MYLANTA) 200-200-20  MG/5ML suspension 30 mL  30 mL Oral Q4H PRN Love, Pamela S, PA-C      . bisacodyl (DULCOLAX) suppository 10 mg  10 mg Rectal Daily PRN Angiulli, Lavon Paganini, PA-C      . ceFAZolin (ANCEF) IVPB 2g/100 mL premix  2 g Intravenous Q6H Jamse Arn, MD 200 mL/hr at 04/06/17 1235 2 g at 04/06/17 1235  . docusate sodium (COLACE) capsule 100 mg  100 mg Oral BID Angiulli, Lavon Paganini, PA-C      . insulin aspart (novoLOG) injection 0-20 Units  0-20 Units Subcutaneous TID WC AngiulliLavon Paganini, PA-C   3 Units at 04/06/17 1235  . insulin aspart (novoLOG) injection 10 Units  10 Units Subcutaneous TID WC AngiulliLavon Paganini, PA-C   10 Units at 04/06/17 1235  . insulin detemir (LEVEMIR) injection 40 Units  40 Units Subcutaneous QHS Cathlyn Parsons, PA-C   40 Units at 04/05/17 2105  . losartan (COZAAR) tablet 100 mg  100 mg Oral Daily Cathlyn Parsons, PA-C   100 mg at 04/06/17 0815  . methocarbamol (ROBAXIN) tablet 500 mg  500 mg Oral Q6H PRN Cathlyn Parsons, PA-C   500 mg at 04/06/17 0019   Or  . methocarbamol (ROBAXIN) 500 mg in dextrose 5 % 50 mL IVPB  500 mg Intravenous Q6H PRN Angiulli, Lavon Paganini, PA-C      . NIFEdipine (PROCARDIA XL/ADALAT-CC) 24 hr tablet 90 mg  90 mg Oral Daily Cathlyn Parsons, PA-C   90 mg at 04/06/17 0815  . ondansetron (ZOFRAN-ODT) disintegrating tablet 4 mg  4 mg Oral Q8H PRN Love, Pamela S, PA-C       Or  . ondansetron (ZOFRAN) injection 4 mg  4 mg Intravenous Q8H PRN Love, Pamela S, PA-C      . oxyCODONE-acetaminophen (PERCOCET/ROXICET) 5-325 MG per tablet 1-2 tablet  1-2 tablet Oral Q6H PRN Cathlyn Parsons, PA-C   2 tablet at 04/06/17 0831  . pantoprazole (PROTONIX) EC tablet 40 mg  40 mg Oral BID Jamse Arn, MD   40 mg at 04/06/17 1231  . polyethylene glycol (MIRALAX / GLYCOLAX) packet 17 g  17 g Oral Daily PRN Angiulli, Lavon Paganini, PA-C      . polyethylene glycol (MIRALAX / GLYCOLAX) packet 17 g  17 g Oral Daily Cathlyn Parsons, PA-C   17 g at 04/05/17 1052  .  senna-docusate (Senokot-S) tablet 1 tablet  1 tablet Oral QHS Angiulli, Lavon Paganini, PA-C      . senna-docusate (Senokot-S) tablet 2 tablet  2 tablet Oral QHS PRN Love, Pamela S, PA-C      . sodium chloride flush (NS) 0.9 % injection 10-40 mL  10-40 mL Intracatheter PRN Jamse Arn, MD   10 mL at 04/06/17 0041  . sodium phosphate (FLEET) 7-19 GM/118ML enema 1 enema  1 enema Rectal Daily PRN Love, Pamela S, PA-C      . sorbitol 70 % solution 30 mL  30 mL Oral Daily PRN Love, Pamela S, PA-C      . traZODone (DESYREL) tablet 50 mg  50 mg Oral QHS PRN Bary Leriche, PA-C        Allergies as of 04/04/2017 - Review Complete 04/04/2017  Allergen Reaction Noted  . Enalapril Cough 11/22/2016    History reviewed. No pertinent family history.  Social History   Socioeconomic History  . Marital status: Single    Spouse name: Not on file  . Number of children: Not on file  . Years of education: Not on file  . Highest education level: Not on file  Occupational History  . Not on file  Social Needs  . Financial resource strain: Not on file  . Food insecurity:    Worry: Not on file    Inability: Not on file  . Transportation needs:    Medical: Not on file    Non-medical: Not on file  Tobacco Use  . Smoking status: Never Smoker  . Smokeless tobacco: Never Used  Substance and Sexual Activity  . Alcohol use: No  . Drug use: No  . Sexual activity: Not on file  Lifestyle  . Physical activity:    Days per week: Not on file    Minutes per session: Not on file  . Stress: Not on file  Relationships  . Social connections:    Talks on phone: Not on file    Gets together: Not on file    Attends religious service: Not on file    Active member of club or organization: Not on file    Attends meetings of clubs or organizations: Not on file    Relationship status: Not on file  . Intimate partner violence:    Fear of current or ex partner: Not on file    Emotionally abused: Not on file     Physically abused: Not on file    Forced sexual activity: Not on file  Other Topics Concern  . Not on file  Social History Narrative  . Not on file    Review of Systems: As per HPI, all others negative.  Physical Exam: Vital signs in last 24 hours: Temp:  [97.6 F (36.4 C)-97.9 F (36.6 C)] 97.9 F (36.6 C) (03/28 0322) Pulse Rate:  [100-114] 100 (03/28 0322) Resp:  [17-18] 17 (03/28 0322) BP: (125-159)/(61-83) 159/83 (03/28 0322) SpO2:  [100 %] 100 % (03/28 0322) Last BM Date: 04/06/17 General:   Alert, overweight, Well-developed, well-nourished, pleasant and cooperative in NAD Head:  Normocephalic and atraumatic. Eyes:  Sclera clear, no icterus.   Conjunctiva pink. Ears:  Normal auditory acuity. Nose:  No deformity, discharge,  or lesions. Mouth:  No deformity or lesions.  Oropharynx pink & moist. Neck:  Supple; no masses or thyromegaly. Lungs:  Clear throughout to auscultation.   No wheezes, crackles, or rhonchi. No acute distress. Heart:  Regular rate and rhythm; no murmurs, clicks, rubs,  or gallops. Abdomen:  Soft, protuberant, nontender and nondistended. No masses, hepatosplenomegaly or hernias noted. Normal bowel sounds, without guarding, and without rebound.     Msk:  Symmetrical without gross deformities. Normal posture. Pulses:  Normal pulses noted. Extremities:  Right below knee amputation; Without clubbing or edema. Neurologic:  Alert and  oriented x4; diffusely weak, otherwise grossly normal neurologically. Skin:  Intact without significant lesions or rashes. Psych:  Alert and cooperative. Normal mood and affect.   Lab Results: Recent Labs    04/05/17 1140 04/06/17 1031  WBC  17.9* 14.0*  HGB 9.2* 8.2*  HCT 27.8* 25.6*  PLT 588* 582*   BMET Recent Labs    04/04/17 0557 04/05/17 1140  NA 138 136  K 3.7 3.6  CL 106 102  CO2 24 24  GLUCOSE 168* 231*  BUN 21* 22*  CREATININE 1.17 1.25*  CALCIUM 8.0* 8.5*   LFT Recent Labs    04/05/17 1140   PROT 6.2*  ALBUMIN 2.1*  AST 75*  ALT 44  ALKPHOS 77  BILITOT 0.5   PT/INR No results for input(s): LABPROT, INR in the last 72 hours.  Studies/Results: No results found.  Impression:  1.  Anemia, slowly progressive. 2.  Hemoccult positive stools. 3.  Dark stools per report. 4.  Recent right below knee amputation for complications from diabetic-related right lower extremity osteomyelitis. 5.  Diabetes, sleep apnea, other comorbidities.  Plan:  1.  Supportive care:  PPI, serial CBCs, IV fluids. 2.  Since patient hasn't even been on a PPI, and since patient seems to have only had one episode of possible melena, would like to see how things do on PPI prior to consideration of endoscopy.  If ongoing melena and worsening anemia, then would do anemia; if melena does not persist and hgb stabilizes on PPI, then would probably treat medically. 3.  [facility] will follow.   LOS: 2 days   Xaviera Flaten M  04/06/2017, 2:16 PM  Cell [phone] If no answer or after 5 PM call [phone]

## 2017-04-06 NOTE — Progress Notes (Signed)
Physical Therapy Session Note  Patient Details  Name: Trevor [name] MRN: [id number] Date of Birth: 1955/01/15  Today's Date: 04/06/2017 PT Individual Time: 1108-1203  PT Individual Time Calculation (min): 55 min   Short Term Goals: Week 1:  PT Short Term Goal 1 (Week 1): Pt will perform bed<>wc trasnfer to min assist PT Short Term Goal 2 (Week 1): Pt will ambulate 5 ft with mod assist and LRAD  PT Short Term Goal 3 (Week 1): Pt will perform bed mobility without cues or assist from PT PT Short Term Goal 4 (Week 1): Pt will propell WC 130f without cues or assist from PT.   Skilled Therapeutic Interventions/Progress Updates:      Pt received sitting in WC and agreeable to PT  PT instructed pt in WC mobility x 1573fwith supervision assist to rehab gym. Min cues from PT to improve pt awareness of obstacles due to visual impairments. PT provided pt with 20x20WC to improve seating position and reduce risk of pressure ulcers and safety while sitting in WC.   Sit<>stand from WC x 8 with RW and mod assist progressing to min assist. Moderate cues for proper UE placement to  Improve WB through LLE as well as improve anterior weight shift.   Stand pivot with RW and mod assist x 2. Moderate cues for AD mangement, step pattern, and posture to improve safety.   Scooting EOB with supervision assist. Sit<>supine with supervision assist. Pt required min cues for safety awareness and proper use of BUE .   Supine BLE therex:  SAQ SLR Hip abduction/adduction. Bridge through thighs on bolster with 3 sec hold.  All exercises completed 2x 12 BLE with moderate cues for proper ROM and decreased speed of eccentric movement to improve strengthening aspect of exercise.   [facility] returned to room and [facility] reach and all needs met.          Therapy Documentation Precautions:  Precautions Precautions: Fall Required Braces or Orthoses: Other Brace/Splint Other  Brace/Splint: shrinker ordered Restrictions Weight Bearing Restrictions: Yes RLE Weight Bearing: Non weight bearing General:   Vital Signs:  HR:105bpm. SpO2 100% Pain: 0/10  See Function Navigator for Current Functional Status.   Therapy/Group: Individual Therapy  AuLorie Phenix/28/2019, 12:01 PM

## 2017-04-07 ENCOUNTER — Inpatient Hospital Stay (HOSPITAL_COMMUNITY): Payer: Self-pay | Admitting: Physical Therapy

## 2017-04-07 ENCOUNTER — Inpatient Hospital Stay (HOSPITAL_COMMUNITY): Payer: Self-pay | Admitting: Occupational Therapy

## 2017-04-07 DIAGNOSIS — R0989 Other specified symptoms and signs involving the circulatory and respiratory systems: Secondary | ICD-10-CM

## 2017-04-07 DIAGNOSIS — R7309 Other abnormal glucose: Secondary | ICD-10-CM

## 2017-04-07 LAB — PREPARE RBC (CROSSMATCH)

## 2017-04-07 LAB — GLUCOSE, CAPILLARY
GLUCOSE-CAPILLARY: 308 mg/dL — AB (ref 65–99)
Glucose-Capillary: 103 mg/dL — ABNORMAL HIGH (ref 65–99)
Glucose-Capillary: 86 mg/dL (ref 65–99)
Glucose-Capillary: 176 mg/dL — ABNORMAL HIGH (ref 65–99)

## 2017-04-07 LAB — CBC WITH DIFFERENTIAL/PLATELET
Basophils Absolute: 0 10*3/uL (ref 0.0–0.1)
Basophils Relative: 0 %
EOS ABS: 0.2 10*3/uL (ref 0.0–0.7)
EOS PCT: 2 %
HCT: 22.1 % — ABNORMAL LOW (ref 39.0–52.0)
Hemoglobin: 7.1 g/dL — ABNORMAL LOW (ref 13.0–17.0)
LYMPHS PCT: 28 %
Lymphs Abs: 2.6 10*3/uL (ref 0.7–4.0)
MCH: 25.8 pg — AB (ref 26.0–34.0)
MCHC: 32.1 g/dL (ref 30.0–36.0)
MCV: 80.4 fL (ref 78.0–100.0)
MONO ABS: 0.6 10*3/uL (ref 0.1–1.0)
Monocytes Relative: 7 %
Neutro Abs: 5.6 10*3/uL (ref 1.7–7.7)
Neutrophils Relative %: 63 %
PLATELETS: 454 10*3/uL — AB (ref 150–400)
RBC: 2.75 MIL/uL — ABNORMAL LOW (ref 4.22–5.81)
RDW: 14.1 % (ref 11.5–15.5)
WBC: 9 10*3/uL (ref 4.0–10.5)

## 2017-04-07 LAB — ABO/RH: ABO/RH(D): O POS

## 2017-04-07 MED ORDER — DIPHENHYDRAMINE HCL 25 MG PO CAPS
25.0000 mg | ORAL_CAPSULE | Freq: Once | ORAL | Status: AC
Start: 2017-04-07 — End: 2017-04-07
  Administered 2017-04-07: 25 mg via ORAL
  Filled 2017-04-07: qty 1

## 2017-04-07 MED ORDER — SODIUM CHLORIDE 0.9 % IV SOLN
Freq: Once | INTRAVENOUS | Status: AC
  Administered 2017-04-07: 23:00:00 via INTRAVENOUS

## 2017-04-07 MED ORDER — FUROSEMIDE 10 MG/ML IJ SOLN
20.0000 mg | Freq: Once | INTRAMUSCULAR | Status: AC
  Administered 2017-04-08: 20 mg via INTRAVENOUS
  Filled 2017-04-07: qty 2

## 2017-04-07 MED ORDER — ACETAMINOPHEN 325 MG PO TABS
650.0000 mg | ORAL_TABLET | Freq: Once | ORAL | Status: AC
  Administered 2017-04-07: 650 mg via ORAL
  Filled 2017-04-07: qty 2

## 2017-04-07 NOTE — Progress Notes (Signed)
No reported melena (but patient has difficulty seeing); Hgb down another gram; no other complaints.  Would continue PPI and revisit tomorrow.  If continued drop in Hgb despite PPI and no anticoagulation, would consider endoscopy some time over the next few days.  [facility] will follow.

## 2017-04-07 NOTE — Patient Care Conference (Signed)
Inpatient RehabilitationTeam Conference and Plan of Care Update Date: 04/05/2017   Time: 2:55 PM    Patient Name: Trevor [name]      Medical Record Number: [id number]  Date of Birth: Nov 28, 1955 Sex: Male         Room/Bed: 4M09C/4M09C-01 Payor Info: Payor: MEDICAID POTENTIAL / Plan: MEDICAID POTENTIAL / Product Type: *No Product type* /    Admitting Diagnosis: R BKA  Admit Date/Time:  04/04/2017  6:32 PM Admission Comments: No comment available   Primary Diagnosis:  <principal problem not specified> Principal Problem: <principal problem not specified>  Patient Active Problem List   Diagnosis Date Noted  . Labile blood pressure   . Labile blood glucose   . Drug induced constipation   . Stage 3 chronic kidney disease ([place])   . Bacteremia   . S/P unilateral BKA (below knee amputation), right ([place]) 04/04/2017  . PAD (peripheral artery disease) ([place])   . Type 2 diabetes mellitus with right diabetic foot ulcer ([place])   . Post-operative pain   . Legally blind   . Acute blood loss anemia   . Streptococcal bacteremia 04/01/2017  . Hypokalemia 03/30/2017  . Uncontrolled type 2 diabetes mellitus with hyperglycemia, with long-term current use of insulin ([place]) 03/30/2017  . Type 2 diabetes mellitus with peripheral neuropathy ([place]) 03/30/2017  . AKI (acute kidney injury) ([place]) 03/30/2017  . CKD stage 3 due to type 2 diabetes mellitus ([place]) 03/30/2017  . Sepsis ([place]) 03/30/2017  . Heart murmur 11/22/2016  . Hyperlipidemia associated with type 2 diabetes mellitus ([place]) 11/22/2016  . Obstructive sleep apnea syndrome 11/22/2016  . Essential hypertension 12/17/2012    Expected Discharge Date: Expected Discharge Date: 04/15/17  Team Members Present: Physician leading conference: Dr. Delice Lesch Social Worker Present: Lennart Pall, LCSW Nurse Present: Arelia Sneddon, RN PT Present: Roderic Ovens, PT;Rosita Dechalus, PTA;Barrie Folk, PT OT Present: Simonne Come, OT SLP Present: Windell Moulding, SLP PPS Coordinator present : [name], RN, CRRN     Current Status/Progress Goal Weekly Team Focus  Medical   Decreased functional mobility secondary to right transtibial amputation 04/01/2017  Improve mobility, transfers, self-care, wound, bacteremia, HTN  See above   Bowel/Bladder   Bowel/bladder continence LBM 04/04/2017, on regular schedule meds and PRN stool softener/ laxatives  Maintain continence  Assess QS and prn medicate   Swallow/Nutrition/ Hydration             ADL's   mod assist transfer with slide board, setup bathing and UB dressing, +2 LB dressing at sit > stand level  Mod I dressing and toileting, Supervision bathing and shower transfer  ADL retraining, transfers, sit <> stand   Mobility   Mod assist Squat pivot transfer. Supervision assist WC mobility, supervision assist bed mobilty   Mod I WC and bed mobility, Supervision assist transfers, Min assist ambulation for short distances,   Safety with transfers and WC mobility, improved strength and endurance to progress to stand pivot transfers and initiation of gait training,    Communication             Safety/Cognition/ Behavioral Observations            Pain   No complain of pain  < 3  Assess QS and prn, medicate with medication and re- evaluate   Skin   s/p Right BKA with wound vac   maintain and improve skin integrity  Assess QS and prn    Rehab Goals Patient on target to meet rehab  goals: Yes *See Care Plan and progress notes for long and short-term goals.     Barriers to Discharge  Current Status/Progress Possible Resolutions Date Resolved   Physician    Medical stability;Wound Care;Weight bearing restrictions     See above  Therapies, d/c VAC this week, follow labs, IV abx, optimize HTN/DM meds      Nursing                  PT  Decreased caregiver support;Home environment access/layout;Weight;IV antibiotics                 OT Decreased caregiver support;Home environment access/layout                 SLP                SW                Discharge Planning/Teaching Needs:  Family only able to provide intermittent support - will discuss further.      Team Discussion:  Tyler Aas;  MD notes medical issues of HTN, CKD and bactaremia. Noted plans for VAC and abx.  Fatigues easily and showing some confusion.  Per family, the confusion is improving.  +2 for LB b/d on eval.  By family report there may be concerns about home environment and team not sure if he could reach a mod ind level of function.  SW to confirm family support and environment.  See above for goals.  Revisions to Treatment Plan:  None    Continued Need for Acute Rehabilitation Level of Care: The patient requires daily medical management by a physician with specialized training in physical medicine and rehabilitation for the following conditions: Daily direction of a multidisciplinary physical rehabilitation program to ensure safe treatment while eliciting the highest outcome that is of practical value to the patient.: Yes Daily medical management of patient stability for increased activity during participation in an intensive rehabilitation regime.: Yes Daily analysis of laboratory values and/or radiology reports with any subsequent need for medication adjustment of medical intervention for : Post surgical problems;Wound care problems;Blood pressure problems;Renal problems;Diabetes problems  Trevor [name], [place] 04/07/2017, 4:01 PM

## 2017-04-07 NOTE — Progress Notes (Addendum)
Pt and Pt's niece were educated to the risks and benefits of transfusion with PRBC all questions answered. Pt was sleeping easily arouses.  . Remains confused blood transfusion started VSS see CHL Lungs clear diminished at 2305, 2320 blood at 15 minute infusion VSS see [facility] pt denies any complaints.

## 2017-04-07 NOTE — Progress Notes (Signed)
[place] PHYSICAL MEDICINE & REHABILITATION     PROGRESS NOTE  Subjective/Complaints:  Patient seen lying in bed this morning. He states he slept well overnight. He then states that he was disturbed by his breakfast tray this morning and wanted to sleep more.  ROS: denies CP, SOB, nausea, vomiting.  Objective: Vital Signs: Blood pressure (!) 141/82, pulse (!) 106, temperature 98.1 F (36.7 C), temperature source Oral, resp. rate 18, weight (!) 137.8 kg (303 lb 12.7 oz), SpO2 98 %. No results found. Recent Labs    04/06/17 1031 04/07/17 0455  WBC 14.0* 9.0  HGB 8.2* 7.1*  HCT 25.6* 22.1*  PLT 582* 454*   Recent Labs    04/05/17 1140  NA 136  K 3.6  CL 102  GLUCOSE 231*  BUN 22*  CREATININE 1.25*  CALCIUM 8.5*   CBG (last 3)  Recent Labs    04/06/17 1633 04/06/17 2202 04/07/17 0640  GLUCAP 303* 175* 103*    Wt Readings from Last 3 Encounters:  04/07/17 (!) 137.8 kg (303 lb 12.7 oz)  03/31/17 134.7 kg (296 lb 15.4 oz)  11/22/16 (!) 148.3 kg (327 lb)    Physical Exam:  BP (!) 141/82 (BP Location: Left Arm)   Pulse (!) 106   Temp 98.1 F (36.7 C) (Oral)   Resp 18   Wt (!) 137.8 kg (303 lb 12.7 oz)   SpO2 98%   BMI 36.98 kg/m  Constitutional: NAD. Well-developed. Obese HENT: Normocephalicand atraumatic.  Eyes:EOMare normal. No discharge.Vision limited but functional. Cardiovascular:+Tachycardia. Regular rhythm.  Respiratory:Effort normal. Norespiratory distress.  GI: Bowel sounds are normal. He exhibitsno distension. Musc: Left stump TTP Neurological. Patient is alert and oriented Follows simple commands.  Bilateral upper extremities LLE 4-4+5/5prox to distal.  RLE: HF 4/5 (unchanged) Skin. Right BKA VAC , stump protector in place. Psych: flat and slowed.  Assessment/Plan: 1. Functional deficits secondary to right BKA which require 3+ hours per day of interdisciplinary therapy in a comprehensive inpatient rehab  setting. Physiatrist is providing close team supervision and 24 hour management of active medical problems listed below. Physiatrist and rehab team continue to assess barriers to discharge/monitor patient progress toward functional and medical goals.  Function:  Bathing Bathing position   Position: Sitting EOB  Bathing parts Body parts bathed by patient: Right arm, Left arm, Chest, Abdomen, Front perineal area, Right upper leg, Left upper leg, Left lower leg, Buttocks Body parts bathed by helper: Back  Bathing assist Assist Level: Touching or steadying assistance(Pt > 75%)      Upper Body Dressing/Undressing Upper body dressing   What is the patient wearing?: Hospital gown, Pull over shirt/dress     Pull over shirt/dress - Perfomed by patient: Thread/unthread right sleeve, Thread/unthread left sleeve, Put head through opening          Upper body assist Assist Level: Set up   Set up : To obtain clothing/put away  Lower Body Dressing/Undressing Lower body dressing   What is the patient wearing?: Pants, Non-skid slipper socks, Underwear Underwear - Performed by patient: Thread/unthread left underwear leg, Pull underwear up/down Underwear - Performed by helper: Thread/unthread right underwear leg Pants- Performed by patient: Pull pants up/down Pants- Performed by helper: Thread/unthread right pants leg, Thread/unthread left pants leg Non-skid slipper socks- Performed by patient: Don/doff left sock Non-skid slipper socks- Performed by helper: Don/doff left sock                  Lower body assist Assist  for lower body dressing: Touching or steadying assistance (Pt > 75%)      Toileting Toileting          Toileting assist     Transfers Chair/bed transfer   Chair/bed transfer method: Squat pivot Chair/bed transfer assist level: Touching or steadying assistance (Pt > 75%) Chair/bed transfer assistive device: Armrests     Locomotion Ambulation Ambulation activity  did not occur: Safety/medical concerns         Wheelchair   Type: Manual Max wheelchair distance: 137ft  Assist Level: Supervision or verbal cues  Cognition Comprehension Comprehension assist level: Follows basic conversation/direction with extra time/assistive device  Expression Expression assist level: Expresses basic needs/ideas: With no assist  Social Interaction Social Interaction assist level: Interacts appropriately 90% of the time - Needs monitoring or encouragement for participation or interaction.  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    Continue CIR 2. DVT Prophylaxis/Anticoagulation:    Subcutaneous Lovenox DC'd on 3/28 due to black stools.    Monitor for any bleeding episodes 3. Pain Management:Percocet every 6 hours and Robaxin as needed 4. Mood:Provide emotional support 5. Neuropsych: This patientiscapable of making decisions on hisown behalf. 6. Skin/Wound Care:Routine skin checks   Plan to D/c Vac on 3/30 7. Fluids/Electrolytes/Nutrition:Routine I&O's  8.Acute blood loss anemia.    Hemoglobin 7.1 on 3/29, continues to trend down   Hemoccult positive   PPI twice a day started on 3/28   Appreciate GI recs, recommended IVF, plan for endoscopy   Daily CBCs ordered 9.ID.Blood cultures 1 of 2 viridans strep bacteremia.  Plan to continue Ancef through 04/15/2017 10.Hypertension. Cozaar 100 mg daily, nifedipine 90 mg daily, HCTZ 12.5 mg daily.    Slightly labile on 3/29 11.Diabetes mellitus with peripheral neuropathy. Hemoglobin A1c 12.5. Levemir 40units nightly, NovoLog 8 units 3 times daily. Check blood sugars before meals and at bedtime.  Diabetic teaching   Labile on 3/29, with one significant elevation 12.CKD III.    Creatinine 1.25 on 3/27   Labs ordered for Monday   Encourage fluids 13.Legally blind. Patient has adapted well to home environment 14.Constipation due to drugs. Laxative assist 15. Leukocytosis:? Resolved   WBCs 9.0 on 3/29   Afebrile   On IV antibiotics    LOS (Days) 3 A FACE TO FACE EVALUATION WAS PERFORMED  Trevor [name] 04/07/2017 8:42 AM

## 2017-04-07 NOTE — Progress Notes (Signed)
Physical Therapy Session Note  Patient Details  Name: Trevor [name] MRN: [id number] Date of Birth: August 03, 1955  Today's Date: 04/07/2017 PT Individual Time: 0800-0915 AND 1445-1545 PT Individual Time Calculation (min): 75 min   Short Term Goals: Week 1:  PT Short Term Goal 1 (Week 1): Pt will perform bed<>wc trasnfer to min assist PT Short Term Goal 2 (Week 1): Pt will ambulate 5 ft with mod assist and LRAD  PT Short Term Goal 3 (Week 1): Pt will perform bed mobility without cues or assist from PT PT Short Term Goal 4 (Week 1): Pt will propell WC 170f without cues or assist from PT.   Skilled Therapeutic Interventions/Progress Updates:   Pt received supine in bed and agreeable to PT. Supine>sit transfer with supervision assist with bed rail and min cues.   Lower body dressing EOB with sit<>stand technique. PT required to manage clothing. min assist for sit<>stand at EOB with RW with moderate cues for LE positioning and proper UE placement.    [facility]. Moderate cues and facilitation from PT to improve anterior weight shift and technique to improve safety. Pt perseverative on "washing up" prior to mobility training. PT educated pt on purpose of PT treatment. Pt then agreeable to initiate mobility training, but when asked to initiate WC mobility pt continued to request to perform bathing task first.   Personal care at sink with moderate cues organization of task from PT to perform facial and oral hygiene. Throughout bathing task, pt repeatedly told PT that he could leave, regardless of education on therapeutic aspect of bathing task and need for PT present.   Upon completion of bathing task, Pt transported to rehab gym. Gait in parallel bars 165fforward and backward with min assist, moderate verbal and tactile cues for safety awareness and proper use of parallel bars to allow advancement of sound LE. Gait with RW 1048fith min assist and max cues for AD management  and safety.     Patient returned to room and [facility] reach and all needs met.    Session 2.     Pt received sitting in WC and agreeable to PT  PT instructed pt in WC [facility] training with supervision assist and WC parts management perform by PT. Min cues to maintain straight path as well as awareness of obstacles due to visual impairments.   PT instructed pt in gait training with RW x [id number] min assist. Moderate cues for gait pattern, improve use of BUE, and AD management to improve safety of gait training. Pt required prolonged rest break following gait training. While PT obtaining equipment for balance tasks, pt noted to have near LOB sitting on EOB by another therapist. He reports that he was trying to fix IV line, and unaware of near LOB. PT provided education to Pt encouraging him to allow hospital RN to manage all IV issues.   Stand pivot transfer x 2 with RW. Min assist from PT and moderate verbal and tactile cues from PT to control RW and improve safety.   Car transfer instructed by PT with Min assist. Moderate verbal and tactile cues to improve safety of transfer. Pt noted to have increased difficulty to exit car, due to poor problem solving abilities.   Patient returned to room and left sitting in WC [facility] in reach and all needs met.     Therapy Documentation Precautions:  Precautions Precautions: Fall  Required Braces or Orthoses: Other Brace/Splint Other Brace/Splint: shrinker ordered Restrictions Weight Bearing Restrictions: Yes RLE Weight Bearing: Non weight bearing  Pain: Pain Assessment Pain Score: 0-No pain   See Function Navigator for Current Functional Status.   Therapy/Group: Individual Therapy  Lorie Phenix 04/07/2017, 9:27 AM

## 2017-04-07 NOTE — Progress Notes (Signed)
Occupational Therapy Session Note  Patient Details  Name: Trevor [name] MRN: [id number] Date of Birth: November 26, 1955  Today's Date: 04/07/2017 OT Individual Time: 0630-1601 OT Individual Time Calculation (min): 55 min    Short Term Goals: Week 1:  OT Short Term Goal 1 (Week 1): Pt will complete LB dressing with min assist OT Short Term Goal 2 (Week 1): Pt will complete toilet transfer with min assist with RW OT Short Term Goal 3 (Week 1): Pt will complete clothing management with toileting with min assist OT Short Term Goal 4 (Week 1): Pt will complete bathing with min assist at sit > stand level  Skilled Therapeutic Interventions/Progress Updates:    Treatment session with focus on ADL retraining with bathing/dressing and transitional movement of sit <> stand.  Pt received upright in w/c, perseverating on not being able to wash prior to first therapy (PT) session this AM.  Engaged in bathing seated at sink with increased time due to impaired organization and sequencing of familiar bathing task.  Pt required question cues for sequencing and organization of task.  Pt required increased time for initiation of tasks.  Engaged in sit > stand with focus on proper hand placement to increase safety.  Despite multimodal cues and explanation, pt required same amount of cuing (max) each attempt for hand placement due to decreased recall of new information.  Pt reports fatigue post sit <> stand practice.  Pt left seated upright in w/c with all needs in reach and chair alarm on.  Therapy Documentation Precautions:  Precautions Precautions: Fall Required Braces or Orthoses: Other Brace/Splint Other Brace/Splint: shrinker ordered Restrictions Weight Bearing Restrictions: Yes RLE Weight Bearing: Non weight bearing General:   Vital Signs: Therapy Vitals Temp: 97.9 F (36.6 C) Temp Source: Oral Pulse Rate: (!) 108 Resp: 18 BP: 130/76 Patient Position (if appropriate): Sitting Oxygen  Therapy SpO2: 99 % O2 Device: Room Air Pain:  Pt with no c/o pain  See Function Navigator for Current Functional Status.   Therapy/Group: Individual Therapy  Simonne Come 04/07/2017, 3:50 PM

## 2017-04-08 ENCOUNTER — Inpatient Hospital Stay (HOSPITAL_COMMUNITY)
Admission: AD | Admit: 2017-04-08 | Discharge: 2017-04-12 | DRG: 092 | Disposition: A | Discharge status: Rehabilitation Facility | Payer: Medicare Other | Source: Ambulatory Visit | Attending: Internal Medicine | Admitting: Internal Medicine

## 2017-04-08 ENCOUNTER — Inpatient Hospital Stay (HOSPITAL_COMMUNITY): Payer: Medicare Other

## 2017-04-08 DIAGNOSIS — T361X5A Adverse effect of cephalosporins and other beta-lactam antibiotics, initial encounter: Secondary | ICD-10-CM | POA: Diagnosis present

## 2017-04-08 DIAGNOSIS — Z6837 Body mass index (BMI) 37.0-37.9, adult: Secondary | ICD-10-CM

## 2017-04-08 DIAGNOSIS — B955 Unspecified streptococcus as the cause of diseases classified elsewhere: Secondary | ICD-10-CM | POA: Diagnosis present

## 2017-04-08 DIAGNOSIS — G9349 Other encephalopathy: Secondary | ICD-10-CM | POA: Insufficient documentation

## 2017-04-08 DIAGNOSIS — R079 Chest pain, unspecified: Secondary | ICD-10-CM

## 2017-04-08 DIAGNOSIS — G92 Toxic encephalopathy: Principal | ICD-10-CM | POA: Diagnosis present

## 2017-04-08 DIAGNOSIS — Z79899 Other long term (current) drug therapy: Secondary | ICD-10-CM

## 2017-04-08 DIAGNOSIS — I10 Essential (primary) hypertension: Secondary | ICD-10-CM | POA: Diagnosis present

## 2017-04-08 DIAGNOSIS — N183 Chronic kidney disease, stage 3 unspecified: Secondary | ICD-10-CM | POA: Diagnosis present

## 2017-04-08 DIAGNOSIS — I129 Hypertensive chronic kidney disease with stage 1 through stage 4 chronic kidney disease, or unspecified chronic kidney disease: Secondary | ICD-10-CM | POA: Diagnosis present

## 2017-04-08 DIAGNOSIS — K922 Gastrointestinal hemorrhage, unspecified: Secondary | ICD-10-CM | POA: Diagnosis present

## 2017-04-08 DIAGNOSIS — E1122 Type 2 diabetes mellitus with diabetic chronic kidney disease: Secondary | ICD-10-CM | POA: Diagnosis present

## 2017-04-08 DIAGNOSIS — E785 Hyperlipidemia, unspecified: Secondary | ICD-10-CM | POA: Diagnosis present

## 2017-04-08 DIAGNOSIS — Z794 Long term (current) use of insulin: Secondary | ICD-10-CM

## 2017-04-08 DIAGNOSIS — D62 Acute posthemorrhagic anemia: Secondary | ICD-10-CM

## 2017-04-08 DIAGNOSIS — G9340 Encephalopathy, unspecified: Secondary | ICD-10-CM | POA: Diagnosis present

## 2017-04-08 DIAGNOSIS — H5509 Other forms of nystagmus: Secondary | ICD-10-CM

## 2017-04-08 DIAGNOSIS — Z888 Allergy status to other drugs, medicaments and biological substances status: Secondary | ICD-10-CM

## 2017-04-08 DIAGNOSIS — R4182 Altered mental status, unspecified: Secondary | ICD-10-CM

## 2017-04-08 DIAGNOSIS — E669 Obesity, unspecified: Secondary | ICD-10-CM | POA: Diagnosis present

## 2017-04-08 DIAGNOSIS — Z9889 Other specified postprocedural states: Secondary | ICD-10-CM

## 2017-04-08 DIAGNOSIS — E873 Alkalosis: Secondary | ICD-10-CM | POA: Diagnosis present

## 2017-04-08 DIAGNOSIS — R7881 Bacteremia: Secondary | ICD-10-CM | POA: Diagnosis present

## 2017-04-08 DIAGNOSIS — G4733 Obstructive sleep apnea (adult) (pediatric): Secondary | ICD-10-CM | POA: Diagnosis present

## 2017-04-08 DIAGNOSIS — E1165 Type 2 diabetes mellitus with hyperglycemia: Secondary | ICD-10-CM

## 2017-04-08 DIAGNOSIS — E119 Type 2 diabetes mellitus without complications: Secondary | ICD-10-CM

## 2017-04-08 DIAGNOSIS — R195 Other fecal abnormalities: Secondary | ICD-10-CM | POA: Diagnosis present

## 2017-04-08 DIAGNOSIS — Z89511 Acquired absence of right leg below knee: Secondary | ICD-10-CM

## 2017-04-08 LAB — BASIC METABOLIC PANEL
ANION GAP: 15 (ref 5–15)
BUN: 11 mg/dL (ref 6–20)
CO2: 22 mmol/L (ref 22–32)
Calcium: 8.8 mg/dL — ABNORMAL LOW (ref 8.9–10.3)
Chloride: 100 mmol/L — ABNORMAL LOW (ref 101–111)
Creatinine, Ser: 1.17 mg/dL (ref 0.61–1.24)
GFR calc Af Amer: >60 mL/min (ref >60)
GFR calc non Af Amer: >60 mL/min (ref >60)
GLUCOSE: 158 mg/dL — AB (ref 65–99)
POTASSIUM: 3.4 mmol/L — AB (ref 3.5–5.1)
Sodium: 137 mmol/L (ref 135–145)

## 2017-04-08 LAB — GLUCOSE, CAPILLARY
GLUCOSE-CAPILLARY: 146 mg/dL — AB (ref 65–99)
GLUCOSE-CAPILLARY: 108 mg/dL — AB (ref 65–99)
GLUCOSE-CAPILLARY: 148 mg/dL — AB (ref 65–99)
Glucose-Capillary: 126 mg/dL — ABNORMAL HIGH (ref 65–99)
Glucose-Capillary: 154 mg/dL — ABNORMAL HIGH (ref 65–99)

## 2017-04-08 LAB — CBC
HCT: 30.4 % — ABNORMAL LOW (ref 39.0–52.0)
HEMATOCRIT: 28.6 % — AB (ref 39.0–52.0)
HEMOGLOBIN: 9.3 g/dL — AB (ref 13.0–17.0)
HEMOGLOBIN: 9.9 g/dL — AB (ref 13.0–17.0)
MCH: 26.6 pg (ref 26.0–34.0)
MCH: 26.8 pg (ref 26.0–34.0)
MCHC: 32.5 g/dL (ref 30.0–36.0)
MCHC: 32.6 g/dL (ref 30.0–36.0)
MCV: 81.9 fL (ref 78.0–100.0)
MCV: 82.4 fL (ref 78.0–100.0)
PLATELETS: 506 10*3/uL — AB (ref 150–400)
Platelets: 545 10*3/uL — ABNORMAL HIGH (ref 150–400)
RBC: 3.49 MIL/uL — AB (ref 4.22–5.81)
RBC: 3.69 MIL/uL — AB (ref 4.22–5.81)
RDW: 15.2 % (ref 11.5–15.5)
RDW: 15.2 % (ref 11.5–15.5)
WBC: 7.9 10*3/uL (ref 4.0–10.5)
WBC: 10.9 10*3/uL — ABNORMAL HIGH (ref 4.0–10.5)

## 2017-04-08 LAB — URINALYSIS, ROUTINE W REFLEX MICROSCOPIC
Bacteria, UA: NONE SEEN
Bilirubin Urine: NEGATIVE
GLUCOSE, UA: 50 mg/dL — AB
KETONES UR: NEGATIVE mg/dL
LEUKOCYTES UA: NEGATIVE
Nitrite: NEGATIVE
PH: 7 (ref 5.0–8.0)
Protein, ur: NEGATIVE mg/dL
SPECIFIC GRAVITY, URINE: 1.006 (ref 1.005–1.030)

## 2017-04-08 LAB — BLOOD GAS, ARTERIAL
ACID-BASE EXCESS: 4 mmol/L — AB (ref 0.0–2.0)
Bicarbonate: 27.4 mmol/L (ref 20.0–28.0)
Drawn by: 365291
FIO2: 21
O2 Saturation: 95.6 %
PATIENT TEMPERATURE: 98.6
PO2 ART: 75.8 mmHg — AB (ref 83.0–108.0)
pCO2 arterial: 36.8 mmHg (ref 32.0–48.0)
pH, Arterial: 7.484 — ABNORMAL HIGH (ref 7.350–7.450)

## 2017-04-08 LAB — OCCULT BLOOD X 1 CARD TO LAB, STOOL: Fecal Occult Bld: POSITIVE — AB

## 2017-04-08 LAB — TROPONIN I: Troponin I: <0.03 ng/mL (ref <0.03)

## 2017-04-08 LAB — CK: Total CK: 69 U/L (ref 49–397)

## 2017-04-08 MED ORDER — INSULIN ASPART 100 UNIT/ML ~~LOC~~ SOLN
0.0000 [IU] | SUBCUTANEOUS | Status: DC
  Administered 2017-04-09: 3 [IU] via SUBCUTANEOUS
  Administered 2017-04-09: 5 [IU] via SUBCUTANEOUS

## 2017-04-08 MED ORDER — ENOXAPARIN SODIUM 40 MG/0.4ML ~~LOC~~ SOLN
40.0000 mg | SUBCUTANEOUS | Status: DC
  Administered 2017-04-08 – 2017-04-11 (×4): 40 mg via SUBCUTANEOUS
  Filled 2017-04-08 (×4): qty 0.4

## 2017-04-08 MED ORDER — ACETAMINOPHEN 650 MG RE SUPP: 650.0000 mg | Freq: Four times a day (QID) | RECTAL | Status: DC | PRN

## 2017-04-08 MED ORDER — CEFAZOLIN SODIUM-DEXTROSE 2-4 GM/100ML-% IV SOLN
2.0000 g | Freq: Four times a day (QID) | INTRAVENOUS | Status: DC
  Administered 2017-04-08 (×2): 2 g via INTRAVENOUS
  Filled 2017-04-08 (×4): qty 100

## 2017-04-08 MED ORDER — INSULIN DETEMIR 100 UNIT/ML ~~LOC~~ SOLN
40.0000 [IU] | Freq: Every day | SUBCUTANEOUS | Status: DC
  Administered 2017-04-09 – 2017-04-11 (×3): 40 [IU] via SUBCUTANEOUS
  Filled 2017-04-08 (×5): qty 0.4

## 2017-04-08 MED ORDER — STROKE: EARLY STAGES OF RECOVERY BOOK
Freq: Once | Status: AC
  Administered 2017-04-09: 02:00:00

## 2017-04-08 MED ORDER — CEFAZOLIN SODIUM-DEXTROSE 2-4 GM/100ML-% IV SOLN
2.0000 g | Freq: Three times a day (TID) | INTRAVENOUS | Status: DC
  Administered 2017-04-09 – 2017-04-10 (×5): 2 g via INTRAVENOUS
  Filled 2017-04-08 (×7): qty 100

## 2017-04-08 MED ORDER — ACETAMINOPHEN 325 MG PO TABS
650.0000 mg | ORAL_TABLET | Freq: Four times a day (QID) | ORAL | Status: DC | PRN
  Administered 2017-04-09 – 2017-04-11 (×2): 650 mg via ORAL
  Filled 2017-04-08 (×3): qty 2

## 2017-04-08 MED ORDER — CEFAZOLIN SODIUM-DEXTROSE 2-4 GM/100ML-% IV SOLN
2.0000 g | Freq: Four times a day (QID) | INTRAVENOUS | Status: DC
  Filled 2017-04-08 (×2): qty 100

## 2017-04-08 MED ORDER — HYDROCHLOROTHIAZIDE 10 MG/ML ORAL SUSPENSION
6.2500 mg | Freq: Every day | ORAL | Status: DC
  Administered 2017-04-08: 6.25 mg via ORAL
  Filled 2017-04-08 (×2): qty 1.25

## 2017-04-08 NOTE — Progress Notes (Signed)
Speech Language Pathology Note  Patient Details  Name: MURICE BARBAR MRN: [id number] Date of Birth: 1955/10/26 Today's Date: 04/08/2017  Received orders for SLP evaluation and will follow up at earliest available appointment.     Matisyn Cabeza, Selinda Orion 04/08/2017, 4:37 PM

## 2017-04-08 NOTE — Consult Note (Signed)
NEURO HOSPITALIST CONSULT NOTE   Requesting physician: Dr. Heber   Reason for Consult: Aphasia  History obtained from:  Chart    HPI:                                                                                                                                          Trevor [name] is an 62 y.o. male who was recently discharged to [facility] on 3/26 after admission for diabetic foot ulcer and osteomyelitis, s/p right BKA complicated by bacteremia. He was in his USOH until Friday when his niece noted that his speech was becoming slower and he was not swallowing his food. Also had flat affect with very slow responses to questions. He required suction as he was not swallowing to command. On Saturday morning he was incontinent x 2. Deficits worsened so rapid response was called with the following noted: "Patient is fully alert but slow to verbally respond.  When asked who the ladies in the room were, he was able to state their names but it took a while to answer.  When asked to identify objects he never answered.  No focal weakness identified."  The patient was readmitted to the hospital floor under the internal medicine teaching service. They note that he has been afebrile but hemodynamically stable. Chest x-ray had revealed a left lower lobe opacity concerning for atelectasis versus pneumonia.    PMHx includes uncontrolled DM, HTN, HLD, sleep apnea and acute blood loss anemia.   Past Medical History:  Diagnosis Date  . Diabetes mellitus without complication ([place])   . Heart murmur   . Hyperlipidemia   . Hypertension   . Sleep apnea     Past Surgical History:  Procedure Laterality Date  . AMPUTATION Right 03/31/2017   Procedure: RIGHT FOOT 1ST AND 2ND RAY AMPUTATION;  Surgeon: Newt Minion, MD;  Location: [facility];  Service: Orthopedics;  Laterality: Right;  . AMPUTATION Right 04/01/2017   Procedure: AMPUTATION BELOW KNEE;  Surgeon: Newt Minion, MD;  Location: [facility];   Service: Orthopedics;  Laterality: Right;  . CORNEAL TRANSPLANT      No family history on file.            Social History:  reports that he has never smoked. He has never used smokeless tobacco. He reports that he does not drink alcohol or use drugs.  Allergies  Allergen Reactions  . Enalapril Cough    MEDICATIONS:  Scheduled: .  stroke: mapping our early stages of recovery book   Does not apply Once  . enoxaparin (LOVENOX) injection  40 mg Subcutaneous Q24H  . insulin aspart  0-9 Units Subcutaneous Q4H  . insulin detemir  40 Units Subcutaneous QHS   Continuous: . [START ON 04/09/2017] ceFAZolin     ZDG:[id number] **OR** acetaminophen   ROS:                                                                                                                                       Unable to obtain due to aphasia.    Blood pressure (!) 163/86, pulse (!) 113, temperature 98.7 F (37.1 C), temperature source Oral, resp. rate 18, height 6\' 4"  (1.93 m), weight (!) 138.3 kg (304 lb 14.3 oz), SpO2 100 %.   General Examination:                                                                                                      HEENT-  [place]/AT    Lungs- Respirations unlabored Extremities- Right BKA  Neurological Examination Mental Status: Awake and alert. One word answers to some questions only, with speech output not consistently pertaining to questions asked or commands given. Increased latency of verbal responses. Was able to name "thumb" and lift LUE to grip examiner's hand; otherwise, does not follow verbal commands. Cranial Nerves: II: Blinks to threat bilaterally. Fixates on examiner's face and objects presented to him. PERRL.  III,IV, VI: No ptosis. Will visually track objects to left and right, up and down. No nystagmus.  V,VII: Smile symmetric. Reacts to tactile  stimulation bilaterally  VIII: hearing intact to voice IX,X: no hoarseness or hypophonia XI: head at midline  XII: Does not follow command Motor/Sensory: Raises bilateral upper extremities antigravity without drift or asymmetry. Does not follow commands for strength testing.  RLE: Does not move to command. Guarding when examiner palpates thigh.  LLE: Withdraws briskly to noxious stimuli. Does not follow commands for volitional movement.  Deep Tendon Reflexes: 1+ brachioradialis, biceps bilaterally. 1+ left patella. 0 left achilles. RLE patellar reflex not tested due to device placement over amputation stub.  Plantars: Left toe downgoing.  Cerebellar: Does not follow commands for testing.  Gait: Unable to assess.    Lab Results: Basic Metabolic Panel: Recent Labs  Lab 04/02/17 0651 04/03/17 0640 04/04/17 0557 04/05/17 1140  NA 135 136 138 136  K 3.3* 3.6 3.7 3.6  CL 105 105 106 102  CO2  24 23 24 24   GLUCOSE 159* 220* 168* 231*  BUN 20 19 21* 22*  CREATININE 1.44* 1.30* 1.17 1.25*  CALCIUM 8.0* 8.1* 8.0* 8.5*    CBC: Recent Labs  Lab 04/02/17 0651 04/05/17 1140 04/06/17 1031 04/07/17 0455 04/08/17 1050  WBC 8.1 17.9* 14.0* 9.0 7.9  NEUTROABS  --  15.0*  --  5.6  --   HGB 9.7* 9.2* 8.2* 7.1* 9.3*  HCT 29.0* 27.8* 25.6* 22.1* 28.6*  MCV 79.5 80.1 80.5 80.4 81.9  PLT 431* 588* 582* 454* 506*    Cardiac Enzymes: No results for input(s): CKTOTAL, CKMB, CKMBINDEX, TROPONINI in the last 168 hours.  Lipid Panel: No results for input(s): CHOL, TRIG, HDL, CHOLHDL, VLDL, LDLCALC in the last 168 hours.  Imaging: Dg Chest 2 View  Result Date: 04/08/2017 CLINICAL DATA:  Lethargy. EXAM: CHEST - 2 VIEW COMPARISON:  03/29/2017 FINDINGS: Right arm PICC line tip projects over the SVC. Mild cardiac enlargement a new opacity overlies the lateral left lower lobe. Right lung appears clear. IMPRESSION: 1. New opacity overlying the lateral left lower lobe may represent pneumonia or  atelectasis. Electronically Signed   By: Kerby Moors M.D.   On: 04/08/2017 15:45   Ct Head Wo Contrast  Result Date: 04/08/2017 CLINICAL DATA:  Acute confusion. EXAM: CT HEAD WITHOUT CONTRAST TECHNIQUE: Contiguous axial images were obtained from the base of the skull through the vertex without intravenous contrast. COMPARISON:  None. FINDINGS: Brain: There is no evidence of acute infarct, intracranial hemorrhage, mass, midline shift, or extra-axial fluid collection. The ventricles and sulci are normal. Vascular: No hyperdense vessel. Skull: No fracture or focal osseous lesion. Sinuses/Orbits: Visualized paranasal sinuses and mastoid air cells are clear. Visualized orbits are unremarkable. Other: None. IMPRESSION: Negative head CT. Electronically Signed   By: Logan Bores M.D.   On: 04/08/2017 19:18    Assessment: 62 year old male with new onset of dense receptive and expressive aphasia.  1. Unclear regarding exact time of onset. Noted to be confused Saturday AM, with speech deficit first noted by family on Friday evening; may have had deficits > 24 hours ago. Gradual worsening would be atypical for stroke. Has infection, which would predispose to encephalopathy. DDx includes toxic/metabolic encephalopathy, subclinical seizure with postictal state and new distal left MCA stroke involving frontal operculum and/or the perisylvian cortices. No motor findings, neglect, gaze deviation, visual field cut or facial droop to suggest LVO.  2. S/P right BKA  Recommendations: 1. MRI brain without contrast.  2. EEG in AM (ordered).   Electronically signed: Dr. Kerney Elbe 04/08/2017, 9:51 PM

## 2017-04-08 NOTE — Progress Notes (Signed)
Second blood transfusion completed. Pt has VSS no complaints no s/sx of adverse reaction noted.

## 2017-04-08 NOTE — Significant Event (Signed)
Rapid Response Event Note  Overview: Time Called: 1741 Arrival Time: 3762 Event Type: Neurologic  Initial Focused Assessment: Patient is fully alert but slow to verbally respond.  When asked who the ladies in the room were, he was able to state their names but it took a while to answer.  When asked to identify objects he never answered.  No focal weakness identified.  CBG 126 BP 158/92  HR 106  RR 18  O2 sat 99% on RA Lung sounds clear, heart tones regular.    Interventions: ABG done:  7.4/36/75/27 Labs done, UA done, PCXR done Head CT done  Plan readmit to hospital:  MD at bedside to assess patient RN to call if assistance needed.   Plan of Care (if not transferred):  Event Summary: Name of Physician Notified: Kirstens at    Name of Consulting Physician Notified: svalina at 1740  Outcome: Transferred (Comment)  Event End Time: 1815  Raliegh Ip

## 2017-04-08 NOTE — Progress Notes (Signed)
PHARMACY NOTE:  ANTIMICROBIAL RENAL DOSAGE ADJUSTMENT  Current antimicrobial regimen includes a mismatch between antimicrobial dosage and estimated renal function.  As per policy approved by the Pharmacy & Therapeutics and Medical Executive Committees, the antimicrobial dosage will be adjusted accordingly.  Current antimicrobial dosage:  Cefazolin 2g IV q6h  Indication: strep viridans bacteremia  Renal Function:  Estimated Creatinine Clearance: 95.5 mL/min (A) (by C-G formula based on SCr of 1.25 mg/dL (H)).    Antimicrobial dosage has been changed to:  Cefazolin 2g IV q8h   Elicia Lamp, PharmD, BCPS Clinical Pharmacist 04/08/2017 8:37 PM

## 2017-04-08 NOTE — Progress Notes (Signed)
Patient with poor appetite and seems to have processing difficulties. Patient incontinent x2 this a.m Patient alert and oriented to self only for me. Patient slow to respond and seems to have some word finding difficulties. Generalized weakness noted. MD Kirstiens notified of patient current status. Continue to monitor.

## 2017-04-08 NOTE — Progress Notes (Signed)
MD Kirstiens notified of patient continued processing difficulties, delayed responses, and confusion. Family at bedside. New orders received. Continue to monitor.

## 2017-04-08 NOTE — H&P (Signed)
Date: 04/08/2017               Patient Name:  Trevor [name] MRN: [id number]  DOB: 02-Dec-1955 Age / Sex: 62 y.o., male   PCP: [name], NP         Medical Service: Internal Medicine Teaching Service         Attending Physician: Dr. Lucious Groves, DO    First Contact: Dr. Frederico Hamman  Pager: [phone]  Second Contact: Dr. Jari Favre  Pager: [phone]       After Hours (After 5p/  First Contact Pager: [phone]  weekends / holidays): Second Contact Pager: [phone]   Chief Complaint: Altered mental status   History of Present Illness:  Trevor [name] is a 62 year old male with history of uncontrolled T2DM, ?CKD, HTN, acute blood loss anemia and recent admission for diabetic foot ulcer, osteomyelitis s/p R BKA complicated by Strep.sanguinis bacteremia who was discharged to CIR on 3/26 on Ancef x 4 weeks. Patient was in his usual state of health until yesterday morning when his niece noticed he was not swallowing his food and his speech was becoming slower. At 9PM yesterday she found him in his room with his phone in his hands confused and without know what to do with it. This morning he did not recognize her, his speech worsened and he stopped responding to questions. Per CIR attending, patient appeared lethargic and somnolent.  Has been afebrile hemodynamically stable.  Chest x-ray revealed a left lower lobe opacity concerning for atelectasis versus pneumonia.  CIR hospital course is remarkable for ongoing blood loss requiring 2 units of blood with appropriate response in Hgb 7.1-->9.3. FOBT positive. He does report dark stools as well as chest pain (which he later denied), though his very unreliable. GI was consulted by CIR.    Review of Systems: A complete ROS was negative except as per HPI.   Meds:  No outpatient medications have been marked as taking for the 04/08/17 encounter [facility] Encounter).     Allergies: Allergies as of 04/08/2017 - Review Complete 04/04/2017    Allergen Reaction Noted  . Enalapril Cough 11/22/2016   Past Medical History:  Diagnosis Date  . Diabetes mellitus without complication ([place])   . Heart murmur   . Hyperlipidemia   . Hypertension   . Sleep apnea     Family History:  No family history on file.  Social History:  Social History   Tobacco Use  . Smoking status: Never Smoker  . Smokeless tobacco: Never Used  Substance Use Topics  . Alcohol use: No  . Drug use: No     Physical Exam: Blood pressure (!) 163/86, pulse (!) 113, temperature 98.7 F (37.1 C), temperature source Oral, resp. rate 18, height 6\' 4"  (1.93 m), weight (!) 304 lb 14.3 oz (138.3 kg), SpO2 100 %.  Physical Exam  Constitutional:  Obese, well-developed male who appears confused sitting up in bed in no acute distress  HENT:  Head: Normocephalic and atraumatic.  Mouth/Throat: Oropharynx is clear and moist. No oropharyngeal exudate.  Eyes: Conjunctivae and EOM are normal.  Neck: Normal range of motion. Neck supple.  Cardiovascular: Regular rhythm and normal heart sounds. Tachycardia present. Exam reveals no gallop and no friction rub.  No murmur heard. Pulmonary/Chest:  Poor air movement due to poor patient effort.  No wheezes or crackles noted.  No increased work of breathing.  Abdominal: Soft. Bowel sounds are normal. He exhibits no distension. There is  no tenderness.  Musculoskeletal: He exhibits no edema.  Neurological:  Patient is alert and oriented to self and place.  Neurological exam difficult to perform due to patient's inability to follow commands. He is able to hold his hands up and lift both lower extremities against gravity. He does have a slight tremor on bilateral upper extremities when lifting arms. No dysmetria noted. He is unable to name objects and appears to have receptive and expressive aphasia.     EKG: personally reviewed my interpretation is sinus tachycardia, LAD no ST changes   CXR: personally reviewed my  interpretation is L opacity on lower lobe    Assessment & Plan by Problem: Principal Problem:   Other encephalopathy Active Problems:   Essential hypertension   Uncontrolled type 2 diabetes mellitus with hyperglycemia, with long-term current use of insulin (HCC)   CKD stage 3 due to type 2 diabetes mellitus ([place])   Streptococcal bacteremia   Upper GI bleed   Encephalopathy  # Altered mental status: Patient presents with a 2-day history of altered mentation including expressive aphasia, slowed speech, and trouble swallowing and following commands.  He is tachycardic and hypertensive, but has been afebrile and hemodynamically stable.  Neurological exam difficult to perform as patient is unable to follow commands at times. Workup remarkable for Hgb 9.3 s/p 2 unit of blood and CXR with LLL opacity (atelectasis). ABG 7.48/36.8/75.8/27. UA with glucosuria and Hgb but no nitrites, leuks or bacteria. Head CT negative. He is not on any narcotics/pain medications. Infectious etiology possible given for recent Strep sanguinis bacteremia but currently on Ancef. Unclear etiology at this time.  Workup as below. - Neurology consulted, appreciate recommendations  - CBC and BMP  - MRI brain  - SLP evaluation   # Normocytic anemia: Patient was noted to be anemic during previous admission that was thought to be secondary to acute blood loss after surgery for R BKA. While at CIR, his Hgb has continued to drop and required 2 units of blood today. FOBT positive and he does report melena though he is unreliable due to confusion. GI consulted recommended monitoring H/H and EGD on 4/1 if hemoglobin continues to drop.  - GI following, appreciate recommendations  - Will continue to monitor Hgb    # Chest pain: Patient reports chest pain but then denies it 10 minutes after. Unclear time of onset or associated symptoms. EKG with ST but no ST changed concerning for acute ischemia.  - On telemetry  - Trending troponin    - Repeat EKG in AM   # Osteomyelitis of R foot s/p R BKA 3/23:  - PT/OT consult for ongoing therapy   # History of Streptococcus sanguinis:  - Continue Ancef until 4/6 - Consider blood cultures and broadening antibiotics if clinically decompensated   # HTN: Currently hypertensive  - Holding given concern for acute CVA  - Will resume home medications if negative MRI brain   # Uncontrolled T2DM: Hemoglobin A1c 12.5. His home regimen was levemir 60u BID, metformin 1000mg  BID, and glipizide 10mg  BID.  He was discharged on metformin 1000mg  BID, levemir 40u QHS, and Novolog 10u TID.   - Levemir 40U QHS  - SSI-S + CBG monitoring    # CKD Stage III: Presented with AKI during previous admission with Cr 2.8, which improved to 1.17 on day of discharge. No history of CKD prior. Most recent BMP on 3/27 with Cr 1.25. Will continue to monitor renal function.  - BMP in AM  F: none  E: monitoring  N: NPO pending  SLP eval --> CM diet   VTE ppx: SQ Lovenox   Code status: Full code, not confirmed on admission     Dispo: Admit patient to Observation with expected length of stay less than 2 midnights.  SignedWelford Roche, MD 04/08/2017, 9:07 PM  Pager: [phone]

## 2017-04-08 NOTE — Progress Notes (Signed)
[facility] Progress Note  Trevor [name] 62 y.o. 04-06-1955   Subjective: Denies abdominal pain. Feels ok. Sitting up in bed eating breakfast.   Objective: Vital signs: Vitals:   04/08/17 0427 04/08/17 0800  BP: (!) 165/86 (!) 165/91  Pulse: 100 (!) 101  Resp: 18 18  Temp: 97.9 F (36.6 C) 98.2 F (36.8 C)  SpO2: 97% 97%    Physical Exam: Gen: lethargic, no acute distress, well-nourished HEENT: anicteric sclera CV: RRR Chest: CTA B Abd: soft, nontender, nondistended, +BS Ext: right BKA; 1+ pitting edema in LLE  Lab Results: Recent Labs    04/05/17 1140  NA 136  K 3.6  CL 102  CO2 24  GLUCOSE 231*  BUN 22*  CREATININE 1.25*  CALCIUM 8.5*   Recent Labs    04/05/17 1140  AST 75*  ALT 44  ALKPHOS 77  BILITOT 0.5  PROT 6.2*  ALBUMIN 2.1*   Recent Labs    04/05/17 1140 04/06/17 1031 04/07/17 0455  WBC 17.9* 14.0* 9.0  NEUTROABS 15.0*  --  5.6  HGB 9.2* 8.2* 7.1*  HCT 27.8* 25.6* 22.1*  MCV 80.1 80.5 80.4  PLT 588* 582* 454*      Assessment/Plan: Anemia with heme positive stool - Hgb drop to 7.1 yesterday and s/p 2U PRBCs last night. Post-transfusion Hgb 9.3. Follow H/Hs and continue supportive care. No signs of overt bleeding. If Hgb continues to drop then consider an EGD on 04/10/17.   [employer] C. 04/08/2017, 11:07 AM  Pager [phone]  AFTER 5 PM or on weekends please call [phone] ID: Trevor [name], male   DOB: October 16, 1955, 62 y.o.   MRN: [id number]

## 2017-04-08 NOTE — Plan of Care (Signed)
  Problem: Consults Goal: RH LIMB LOSS PATIENT EDUCATION Description Description: See Patient Education module for eduction specifics. Outcome: Progressing Goal: Skin Care Protocol Initiated - if Braden Score 18 or less Description If consults are not indicated, leave blank or document N/A Outcome: Progressing Goal: Diabetes Guidelines if Diabetic/Glucose > 140 Description If diabetic or lab glucose is > 140 mg/dl - Initiate Diabetes/Hyperglycemia Guidelines & Document Interventions  Outcome: Progressing   Problem: RH BOWEL ELIMINATION Goal: RH STG MANAGE BOWEL WITH ASSISTANCE Description STG Manage Bowel with Min. Assistance.  Outcome: Progressing   Problem: RH SKIN INTEGRITY Goal: RH STG SKIN FREE OF INFECTION/BREAKDOWN Description With min. assist.  Outcome: Progressing Goal: RH STG MAINTAIN SKIN INTEGRITY WITH ASSISTANCE Description STG Maintain Skin Integrity With Min.Assistance.  Outcome: Progressing Goal: RH STG ABLE TO PERFORM INCISION/WOUND CARE W/ASSISTANCE Description STG Able To Perform Incision/Wound Care With Mod. Assistance.  Outcome: Progressing   Problem: RH SAFETY Goal: RH STG ADHERE TO SAFETY PRECAUTIONS W/ASSISTANCE/DEVICE Description STG Adhere to Safety Precautions With Mod.Assistance/Device.  Outcome: Progressing   Problem: RH PAIN MANAGEMENT Goal: RH STG PAIN MANAGED AT OR BELOW PT'S PAIN GOAL Description Less than 3, on 1 to 10 scale.  Outcome: Progressing   Problem: RH KNOWLEDGE DEFICIT LIMB LOSS Goal: RH STG INCREASE KNOWLEDGE OF SELF CARE AFTER LIMB LOSS Description Family and pt. Will be able to verbalized the education about limb care,dressing and precautions.  Outcome: Progressing   Problem: RH BLADDER ELIMINATION Goal: RH STG MANAGE BLADDER WITH ASSISTANCE Description STG Manage Bladder With Min.Assistance  Outcome: Not Progressing

## 2017-04-08 NOTE — Progress Notes (Signed)
Approximately 1730 patient noted by family to not be able to swallow his food. Patient alert but flat affect and very slow/delayed to respond to questions. Patient not swallow on command. Patient required suction. MD Kirsteins notified again. New orders received. Rapid response notified and was able to see patient. Internal medicine contacted by MD and saw patient this evening. Patient to be transferred to 3W34. Family at bedside.

## 2017-04-08 NOTE — Progress Notes (Addendum)
[place] PHYSICAL MEDICINE & REHABILITATION     PROGRESS NOTE  Subjective/Complaints:   Blood just finsihed transfusing denies abd pain no dyspnea or orthopnea  ROS: denies CP, SOB, nausea, vomiting.  Objective: Vital Signs: Blood pressure (!) 165/86, pulse 100, temperature 97.9 F (36.6 C), temperature source Oral, resp. rate 18, weight (!) 137.2 kg (302 lb 8 oz), SpO2 97 %. No results found. Recent Labs    04/06/17 1031 04/07/17 0455  WBC 14.0* 9.0  HGB 8.2* 7.1*  HCT 25.6* 22.1*  PLT 582* 454*   Recent Labs    04/05/17 1140  NA 136  K 3.6  CL 102  GLUCOSE 231*  BUN 22*  CREATININE 1.25*  CALCIUM 8.5*   CBG (last 3)  Recent Labs    04/07/17 1646 04/07/17 2107 04/08/17 0620  GLUCAP 176* 308* 146*    Wt Readings from Last 3 Encounters:  04/08/17 (!) 137.2 kg (302 lb 8 oz)  03/31/17 134.7 kg (296 lb 15.4 oz)  11/22/16 (!) 148.3 kg (327 lb)    Physical Exam:  BP (!) 165/86   Pulse 100   Temp 97.9 F (36.6 C) (Oral)   Resp 18   Wt (!) 137.2 kg (302 lb 8 oz)   SpO2 97%   BMI 36.82 kg/m  Constitutional: NAD. Well-developed. Obese HENT: Normocephalicand atraumatic.  Eyes:EOMare normal. No discharge.Vision limited but functional. Cardiovascular:+Tachycardia. Regular rhythm.  Respiratory:Effort normal. Norespiratory distress.  GI: Bowel sounds are normal. He exhibitsno distension. Musc: Left stump TTP Neurological. Patient is alert and oriented Follows simple commands.  Bilateral upper extremities LLE 4-4+5/5prox to distal.  RLE: HF 4/5 (unchanged) Skin. Right BKA VAC , stump protector in place. Psych: flat and slowed.  Assessment/Plan: 1. Functional deficits secondary to right BKA which require 3+ hours per day of interdisciplinary therapy in a comprehensive inpatient rehab setting. Physiatrist is providing close team supervision and 24 hour management of active medical problems listed below. Physiatrist and rehab team continue  to assess barriers to discharge/monitor patient progress toward functional and medical goals.  Function:  Bathing Bathing position   Position: Sitting EOB  Bathing parts Body parts bathed by patient: Right arm, Left arm, Chest, Abdomen, Front perineal area, Right upper leg, Left upper leg, Left lower leg, Buttocks Body parts bathed by helper: Back  Bathing assist Assist Level: Touching or steadying assistance(Pt > 75%)      Upper Body Dressing/Undressing Upper body dressing   What is the patient wearing?: Hospital gown, Pull over shirt/dress     Pull over shirt/dress - Perfomed by patient: Thread/unthread right sleeve, Thread/unthread left sleeve, Put head through opening          Upper body assist Assist Level: Set up   Set up : To obtain clothing/put away  Lower Body Dressing/Undressing Lower body dressing   What is the patient wearing?: Pants, Non-skid slipper socks, Underwear Underwear - Performed by patient: Thread/unthread left underwear leg, Pull underwear up/down Underwear - Performed by helper: Thread/unthread right underwear leg Pants- Performed by patient: Pull pants up/down Pants- Performed by helper: Thread/unthread right pants leg, Thread/unthread left pants leg Non-skid slipper socks- Performed by patient: Don/doff left sock Non-skid slipper socks- Performed by helper: Don/doff left sock                  Lower body assist Assist for lower body dressing: Touching or steadying assistance (Pt > 75%)      Toileting Toileting  Toileting assist     Transfers Chair/bed transfer   Chair/bed transfer method: Squat pivot Chair/bed transfer assist level: Touching or steadying assistance (Pt > 75%) Chair/bed transfer assistive device: Armrests     Locomotion Ambulation Ambulation activity did not occur: Safety/medical concerns         Wheelchair   Type: Manual Max wheelchair distance: 177ft  Assist Level: Supervision or verbal cues   Cognition Comprehension Comprehension assist level: Follows basic conversation/direction with extra time/assistive device  Expression Expression assist level: Expresses basic needs/ideas: With extra time/assistive device  Social Interaction Social Interaction assist level: Interacts appropriately 75 - 89% of the time - Needs redirection for appropriate language or to initiate interaction.  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    Continue CIR PT, OT 2. DVT Prophylaxis/Anticoagulation:    Subcutaneous Lovenox DC'd on 3/28 due to black stools.    Monitor for any bleeding episodes 3. Pain Management:Percocet every 6 hours and Robaxin as needed 4. Mood:Provide emotional support 5. Neuropsych: This patientiscapable of making decisions on hisown behalf. 6. Skin/Wound Care:Routine skin checks   Plan to D/c Vac on 3/30 7. Fluids/Electrolytes/Nutrition:Routine I&O's  8.Acute blood loss anemia.    Hemoglobin 7.1 on 3/29,Transfusion just finished    Hemoccult positive   PPI twice a day started on 3/28   Appreciate GI recs, recommended IVF, plan for endoscopy   Daily CBCs ordered 9.ID.Blood cultures 1 of 2 viridans strep bacteremia.  Plan to continue Ancef through 04/15/2017 10.Hypertension. Cozaar 100 mg daily, nifedipine 90 mg daily, (HCTZ 12.5 mg daily is on hold but will resume).     Vitals:   04/08/17 0427 04/08/17 0800  BP: (!) 165/86 (!) 165/91  Pulse: 100 (!) 101  Resp: 18 18  Temp: 97.9 F (36.6 C) 98.2 F (36.8 C)  SpO2: 97% 97%   11.Diabetes mellitus with peripheral neuropathy. Hemoglobin A1c 12.5. Levemir 40units nightly, NovoLog 8 units 3 times daily.  Check blood sugars before meals and at bedtime. Diabetic teaching    CBG (last 3)  Recent Labs    04/07/17 1646 04/07/17 2107 04/08/17 0620  GLUCAP 176* 308* 146*  Still labile 3/30 monitor for pattern       12.CKD III.    Creatinine 1.25 on 3/27   Labs ordered for Monday   Encourage fluids 13.Legally blind. Patient has adapted well to home environment 14.Constipation due to drugs. Laxative assist    15,  Per nursing very slow today, difficulty using urinal, slow to respond, not oriented.  On IV abx for bacteremia due to LE infection, no focal neuro def, Afebrile, nl WBCs, no sedating meds.  Will check UA C and S to R/O UTI, CHeck CXR to R/o infiltrate  LOS (Days) 4 A FACE TO FACE EVALUATION WAS PERFORMED  Charlett Blake 04/08/2017 8:10 AM

## 2017-04-09 ENCOUNTER — Observation Stay (HOSPITAL_COMMUNITY): Payer: Medicare Other

## 2017-04-09 ENCOUNTER — Inpatient Hospital Stay (HOSPITAL_COMMUNITY): Payer: Self-pay

## 2017-04-09 ENCOUNTER — Encounter (HOSPITAL_COMMUNITY): Payer: Self-pay

## 2017-04-09 LAB — GLUCOSE, CAPILLARY
GLUCOSE-CAPILLARY: 166 mg/dL — AB (ref 65–99)
GLUCOSE-CAPILLARY: 208 mg/dL — AB (ref 65–99)
Glucose-Capillary: 203 mg/dL — ABNORMAL HIGH (ref 65–99)
Glucose-Capillary: 186 mg/dL — ABNORMAL HIGH (ref 65–99)
Glucose-Capillary: 270 mg/dL — ABNORMAL HIGH (ref 65–99)
Glucose-Capillary: 220 mg/dL — ABNORMAL HIGH (ref 65–99)

## 2017-04-09 LAB — BASIC METABOLIC PANEL
ANION GAP: 16 — AB (ref 5–15)
BUN: 8 mg/dL (ref 6–20)
CO2: 23 mmol/L (ref 22–32)
Calcium: 8.5 mg/dL — ABNORMAL LOW (ref 8.9–10.3)
Chloride: 99 mmol/L — ABNORMAL LOW (ref 101–111)
Creatinine, Ser: 1.17 mg/dL (ref 0.61–1.24)
GLUCOSE: 204 mg/dL — AB (ref 65–99)
POTASSIUM: 3.3 mmol/L — AB (ref 3.5–5.1)
Sodium: 138 mmol/L (ref 135–145)

## 2017-04-09 LAB — BPAM RBC
BLOOD PRODUCT EXPIRATION DATE: 201904242359
Blood Product Expiration Date: 201904242359
ISSUE DATE / TIME: 201903300359
ISSUE DATE / TIME: 201903292239
Unit Type and Rh: 5100
Unit Type and Rh: 5100

## 2017-04-09 LAB — TYPE AND SCREEN
ABO/RH(D): O POS
ANTIBODY SCREEN: NEGATIVE
UNIT DIVISION: 0
Unit division: 0

## 2017-04-09 LAB — CBC
HCT: 29.5 % — ABNORMAL LOW (ref 39.0–52.0)
Hemoglobin: 9.5 g/dL — ABNORMAL LOW (ref 13.0–17.0)
MCH: 26.8 pg (ref 26.0–34.0)
MCHC: 32.2 g/dL (ref 30.0–36.0)
MCV: 83.3 fL (ref 78.0–100.0)
Platelets: 541 10*3/uL — ABNORMAL HIGH (ref 150–400)
RBC: 3.54 MIL/uL — ABNORMAL LOW (ref 4.22–5.81)
RDW: 15.5 % (ref 11.5–15.5)
WBC: 9.3 10*3/uL (ref 4.0–10.5)

## 2017-04-09 LAB — TROPONIN I

## 2017-04-09 LAB — AMMONIA: Ammonia: 20 umol/L (ref 9–35)

## 2017-04-09 MED ORDER — LOSARTAN POTASSIUM 50 MG PO TABS
100.0000 mg | ORAL_TABLET | Freq: Every day | ORAL | Status: DC
  Administered 2017-04-09 – 2017-04-12 (×4): 100 mg via ORAL
  Filled 2017-04-09 (×4): qty 2

## 2017-04-09 MED ORDER — SODIUM CHLORIDE 0.9% FLUSH
10.0000 mL | Freq: Two times a day (BID) | INTRAVENOUS | Status: DC
  Administered 2017-04-09 – 2017-04-12 (×6): 10 mL

## 2017-04-09 MED ORDER — IOPAMIDOL (ISOVUE-370) INJECTION 76%
INTRAVENOUS | Status: AC
  Administered 2017-04-09: 100 mL
  Filled 2017-04-09: qty 100

## 2017-04-09 MED ORDER — INSULIN ASPART 100 UNIT/ML ~~LOC~~ SOLN
10.0000 [IU] | Freq: Three times a day (TID) | SUBCUTANEOUS | Status: DC
  Administered 2017-04-09 – 2017-04-12 (×5): 10 [IU] via SUBCUTANEOUS

## 2017-04-09 MED ORDER — THIAMINE HCL 100 MG/ML IJ SOLN
500.0000 mg | Freq: Three times a day (TID) | INTRAVENOUS | Status: AC
  Administered 2017-04-09 – 2017-04-12 (×9): 500 mg via INTRAVENOUS
  Filled 2017-04-09 (×9): qty 5

## 2017-04-09 MED ORDER — POTASSIUM CHLORIDE 10 MEQ/100ML IV SOLN
10.0000 meq | INTRAVENOUS | Status: AC
  Administered 2017-04-09 (×4): 10 meq via INTRAVENOUS
  Filled 2017-04-09 (×4): qty 100

## 2017-04-09 MED ORDER — NIFEDIPINE ER OSMOTIC RELEASE 90 MG PO TB24: 90.0000 mg | ORAL_TABLET | Freq: Every day | ORAL | Status: DC

## 2017-04-09 MED ORDER — INSULIN ASPART 100 UNIT/ML ~~LOC~~ SOLN
0.0000 [IU] | Freq: Three times a day (TID) | SUBCUTANEOUS | Status: DC
  Administered 2017-04-09: 5 [IU] via SUBCUTANEOUS
  Administered 2017-04-10: 3 [IU] via SUBCUTANEOUS
  Administered 2017-04-11: 11 [IU] via SUBCUTANEOUS
  Administered 2017-04-12 (×2): 5 [IU] via SUBCUTANEOUS
  Administered 2017-04-12: 2 [IU] via SUBCUTANEOUS

## 2017-04-09 NOTE — Evaluation (Signed)
Speech Language Pathology Evaluation Patient Details Name: Trevor [name] MRN: [id number] DOB: 07/21/1955 Today's Date: 04/09/2017 Time: 1300-1320 SLP Time Calculation (min) (ACUTE ONLY): 20 min  Problem List:  Patient Active Problem List   Diagnosis Date Noted  . Other encephalopathy 04/08/2017  . Encephalopathy 04/08/2017  . Altered mental status 04/08/2017  . Labile blood pressure   . Labile blood glucose   . Drug induced constipation   . Stage 3 chronic kidney disease ([place])   . Bacteremia   . S/P unilateral BKA (below knee amputation), right ([place]) 04/04/2017  . PAD (peripheral artery disease) ([place])   . Type 2 diabetes mellitus with right diabetic foot ulcer ([place])   . Post-operative pain   . Legally blind   . Upper GI bleed   . Streptococcal bacteremia 04/01/2017  . Hypokalemia 03/30/2017  . Uncontrolled type 2 diabetes mellitus with hyperglycemia, with long-term current use of insulin ([place]) 03/30/2017  . Type 2 diabetes mellitus with peripheral neuropathy ([place]) 03/30/2017  . AKI (acute kidney injury) ([place]) 03/30/2017  . CKD stage 3 due to type 2 diabetes mellitus ([place]) 03/30/2017  . Sepsis ([place]) 03/30/2017  . Heart murmur 11/22/2016  . Hyperlipidemia associated with type 2 diabetes mellitus ([place]) 11/22/2016  . Obstructive sleep apnea syndrome 11/22/2016  . Essential hypertension 12/17/2012   Past Medical History:  Past Medical History:  Diagnosis Date  . Diabetes mellitus without complication ([place])   . Heart murmur   . Hyperlipidemia   . Hypertension   . Sleep apnea    Past Surgical History:  Past Surgical History:  Procedure Laterality Date  . AMPUTATION Right 03/31/2017   Procedure: RIGHT FOOT 1ST AND 2ND RAY AMPUTATION;  Surgeon: Newt Minion, MD;  Location: [place];  Service: Orthopedics;  Laterality: Right;  . AMPUTATION Right 04/01/2017   Procedure: AMPUTATION BELOW KNEE;  Surgeon: Newt Minion, MD;  Location: [place];  Service: Orthopedics;  Laterality:  Right;  . CORNEAL TRANSPLANT     HPI:  62 year old male admitted 04/08/17. PMH significant for DM, HTN, legally blind, CKD3. MRI normal   Assessment / Plan / Recommendation Clinical Impression  Pt presents with mild receptive and significant expressive language deficits. Pt yes/no responses are not reliable. Pt could answer simple yes/no questions with  only ~50% accuracy. "No" responses were not elicited - pt stated the correct answer rather than answer "no". Pt could follow simple one step directions, but accuracy declined with increased complexity. Gestures facilitate accuracy.   Expressively, pt was able to complete some automatic sequences (name, social exchanges, counting), but not others (days, months) despite max cues. He could repeat single words, but not phrase length material. Pt was able to name common objects, but significant processing time was required. Responsive naming tasks were also highly inconsistent. Pt exhibits language of confusion, perseverating on being a "part of the miracle", but then has periods where he will stare at you, making no verbal responses at all. ST will continue to follow acutely.     SLP Assessment  SLP Recommendation/Assessment: Patient needs continued Speech Language Pathology Services SLP Visit Diagnosis: Cognitive communication deficit (R41.841)    Follow Up Recommendations  Inpatient Rehab    Frequency and Duration min 2x/week  2 weeks      SLP Evaluation Cognition  Overall Cognitive Status: Impaired/Different from baseline(unable to fully assess due to expressive language deficits) Arousal/Alertness: Awake/alert       Comprehension  Auditory Comprehension Overall Auditory Comprehension: Impaired Yes/No  Questions: Impaired Basic Biographical Questions: 26-50% accurate Basic Immediate Environment Questions: 25-49% accurate Complex Questions: 25-49% accurate Commands: Impaired One Step Basic Commands: 75-100% accurate Two Step Basic  Commands: 25-49% accurate Conversation: Simple Interfering Components: Processing speed;Visual impairments EffectiveTechniques: Extra processing time Visual Recognition/Discrimination Discrimination: Not tested Reading Comprehension Reading Status: Not tested    Expression Expression Primary Mode of Expression: Verbal Verbal Expression Overall Verbal Expression: Impaired Initiation: Impaired Automatic Speech: Name;Social Response;Counting(unable to provide DOW, MOY) Level of Generative/Spontaneous Verbalization: Phrase Repetition: No impairment Naming: Impairment Responsive: 26-50% accurate Confrontation: (significant delay in response time) Verbal Errors: Echolalia;Language of confusion Written Expression Dominant Hand: Right Written Expression: Not tested   Oral / Motor  Oral Motor/Sensory Function Overall Oral Motor/Sensory Function: Within functional limits Motor Speech Overall Motor Speech: Appears within functional limits for tasks assessed   GO                   Celia B. Quentin Ore [facility], CCC-SLP Speech Language Pathologist [phone]  Shonna Chock 04/09/2017, 1:53 PM

## 2017-04-09 NOTE — Progress Notes (Signed)
Dr. Reesa Chew was made aware that Trevor [name], Pt's niece, wants to know whats going on with the pt and the results of pt's tests.

## 2017-04-09 NOTE — Progress Notes (Signed)
EEG Completed; Results Pending  

## 2017-04-09 NOTE — Discharge Summary (Deleted)
  The note originally documented on this encounter has been moved the the encounter in which it belongs.  

## 2017-04-09 NOTE — Discharge Summary (Signed)
Discharge summary job # [id number]

## 2017-04-09 NOTE — Progress Notes (Signed)
Report received, agree with assessment as documented. 

## 2017-04-09 NOTE — Progress Notes (Signed)
SLP Cancellation Note  Patient Details Name: Trevor [name] MRN: [id number] DOB: April 08, 1955   Cancelled treatment:       Reason Eval/Treat Not Completed: Patient at procedure or test/unavailable. Will continue efforts.  Shanya Ferriss B. Quentin Ore [facility], [place] Speech Language Pathologist [phone]  Shonna Chock 04/09/2017, 10:56 AM

## 2017-04-09 NOTE — Progress Notes (Signed)
[facility] Gastroenterology Progress Note  Trevor [name] 62 y.o. 10-09-1955   Subjective: Resting in bedside chair. Easily arousable. Denies abdominal pain. Aphasia yesterday and transferred to 3W for workup for a possible stroke.  Objective: Vital signs: Vitals:   04/09/17 0556 04/09/17 0839  BP: (!) 165/102 (!) 179/100  Pulse:  (!) 115  Resp: 16 18  Temp: (!) 97.2 F (36.2 C) 98.8 F (37.1 C)  SpO2: 98%     Physical Exam: Gen: somnolent, obese, no acute distress  HEENT: anicteric sclera CV: RRR Chest: CTA B Abd: soft, nontender, nondistended, +BS   Lab Results: Recent Labs    04/08/17 2108 04/09/17 0731  NA 137 138  K 3.4* 3.3*  CL 100* 99*  CO2 22 23  GLUCOSE 158* 204*  BUN 11 8  CREATININE 1.17 1.17  CALCIUM 8.8* 8.5*   No results for input(s): AST, ALT, ALKPHOS, BILITOT, PROT, ALBUMIN in the last 72 hours. Recent Labs    04/07/17 0455  04/08/17 2108 04/09/17 0731  WBC 9.0   < > 10.9* 9.3  NEUTROABS 5.6  --   --   --   HGB 7.1*   < > 9.9* 9.5*  HCT 22.1*   < > 30.4* 29.5*  MCV 80.4   < > 82.4 83.3  PLT 454*   < > 545* 541*   < > = values in this interval not displayed.      Assessment/Plan: Anemia - stable. S/P 2 U PRBCs on 04/07/17. Since Hgb is stable and with evaluation for possible stroke would NOT recommend an EGD. NPO due to stroke work up. Supportive care. [facility] will sign off. Call us back if questions arise.   [place] C. 04/09/2017, 10:16 AM  Pager [phone]  AFTER 5 PM or on weekends please call [phone] ID: Trevor [name], male   DOB: 1955/03/07, 62 y.o.   MRN: [id number]

## 2017-04-09 NOTE — Plan of Care (Signed)
  Problem: Education: Goal: Knowledge of disease or condition will improve Outcome: Progressing   

## 2017-04-09 NOTE — Evaluation (Signed)
Clinical/Bedside Swallow Evaluation Patient Details  Name: Trevor [name] MRN: [id number] Date of Birth: 12-Oct-1955  Today's Date: 04/09/2017 Time: SLP Start Time (ACUTE ONLY): 32 SLP Stop Time (ACUTE ONLY): 1340 SLP Time Calculation (min) (ACUTE ONLY): 20 min  Past Medical History:  Past Medical History:  Diagnosis Date  . Diabetes mellitus without complication ([place])   . Heart murmur   . Hyperlipidemia   . Hypertension   . Sleep apnea    Past Surgical History:  Past Surgical History:  Procedure Laterality Date  . AMPUTATION Right 03/31/2017   Procedure: RIGHT FOOT 1ST AND 2ND RAY AMPUTATION;  Surgeon: Newt Minion, MD;  Location: [place];  Service: Orthopedics;  Laterality: Right;  . AMPUTATION Right 04/01/2017   Procedure: AMPUTATION BELOW KNEE;  Surgeon: Newt Minion, MD;  Location: [place];  Service: Orthopedics;  Laterality: Right;  . CORNEAL TRANSPLANT     HPI:  Trevor [name] admitted 04/08/17. PMH significant for DM, HTN, legally blind, CKD3. MRI normal   Assessment / Plan / Recommendation Clinical Impression  Pt presents with adequate oral motor strength and function. Trials of thin liquid, puree, and solid consistencies were presented. Pt tolerated all items without oral difficulty or overt s/s aspiration. Recommend beginning regular diet and thin liquids. ST will follow for assessment of diet tolerance and education.  SLP Visit Diagnosis: Dysphagia, unspecified (R13.10)    Aspiration Risk  Mild aspiration risk    Diet Recommendation Regular;Thin liquid   Liquid Administration via: Cup;Straw Medication Administration: Whole meds with liquid Supervision: Patient able to self feed Compensations: Minimize environmental distractions;Small sips/bites;Slow rate Postural Changes: Seated upright at 90 degrees    Other  Recommendations Oral Care Recommendations: Oral care BID   Follow up Recommendations Inpatient Rehab      Frequency and Duration min 2x/week  2  weeks       Prognosis Prognosis for Safe Diet Advancement: Good      Swallow Study   General Date of Onset: 04/08/17 HPI: Trevor [name] admitted 04/08/17. PMH significant for DM, HTN, legally blind, CKD3. MRI normal Type of Study: Bedside Swallow Evaluation Previous Swallow Assessment: none Diet Prior to this Study: NPO Temperature Spikes Noted: No Respiratory Status: Room air History of Recent Intubation: No Behavior/Cognition: Alert;Doesn't follow directions;Requires cueing Oral Cavity Assessment: Within Functional Limits Oral Care Completed by SLP: No Oral Cavity - Dentition: Adequate natural dentition Vision: Functional for self-feeding Self-Feeding Abilities: Able to feed self Patient Positioning: Upright in chair Baseline Vocal Quality: Normal Volitional Cough: Strong Volitional Swallow: Able to elicit    Oral/Motor/Sensory Function Overall Oral Motor/Sensory Function: Within functional limits   Ice Chips Ice chips: Not tested   Thin Liquid Thin Liquid: Within functional limits Presentation: Straw    Nectar Thick Nectar Thick Liquid: Not tested   Honey Thick Honey Thick Liquid: Not tested   Puree Puree: Within functional limits Presentation: Spoon   Solid   GO   Solid: Within functional limits Presentation: Trevor [name], [facility], [place] Speech Language Pathologist [phone]  Shonna Chock 04/09/2017,1:59 PM

## 2017-04-09 NOTE — Progress Notes (Signed)
Rehab Admissions Coordinator Note:  Patient was screened by Retta Diones for appropriateness for an Inpatient Acute Rehab Consult.  At this time, we are recommending Inpatient Rehab consult.  Retta Diones 04/09/2017, 9:24 AM  I can be reached at [phone].

## 2017-04-09 NOTE — Progress Notes (Addendum)
Subjective:  No acute events overnight.  Patient remains confused but states he is feeling better than yesterday.  When asked what happened yesterday he stated he told the nurses they were going to witness a miracle. Denies chest pain and shortness of breath.  Did not voice any other complaints this morning.  Objective:  Vital signs in last 24 hours: Vitals:   04/09/17 0237 04/09/17 0430 04/09/17 0556 04/09/17 0839  BP: (!) 167/96 (!) 159/101 (!) 165/102 (!) 179/100  Pulse:    (!) 115  Resp:  18 16 18   Temp:   (!) 97.2 F (36.2 C) 98.8 F (37.1 C)  TempSrc:   Axillary Oral  SpO2: 99% 97% 98%   Weight:      Height:       Physical Exam  Constitutional:  Elderly, obese male sitting up in chair in no acute distress.  HENT:  Head: Normocephalic and atraumatic.  Eyes: EOM are normal. No scleral icterus.  Neck: Normal range of motion. Neck supple.  Cardiovascular: Regular rhythm and normal heart sounds. Tachycardia present. Exam reveals no gallop and no friction rub.  No murmur heard. Pulmonary/Chest: Breath sounds normal. No respiratory distress. He has no wheezes. He has no rales.  Abdominal: Soft. Bowel sounds are normal. He exhibits no distension. There is no tenderness.  Musculoskeletal: He exhibits deformity (S/p R BKA). He exhibits no edema.  Neurological: He is alert.  Patient is alert and oriented to self and place.  Thought process remains delayed but improved from yesterday.  Unable to recall yesterday's events and states he is here to witness a miracle.  He is able to follow commands today.  No cranial nerve deficits noted.  He does have L beating nystagmus.  He is able to move all 4 extremities and has full strength on bilateral upper extremities.  No sensory deficits noted.     Assessment/Plan:  Principal Problem:   Other encephalopathy Active Problems:   Essential hypertension   Uncontrolled type 2 diabetes mellitus with hyperglycemia, with long-term current use  of insulin (HCC)   CKD stage 3 due to type 2 diabetes mellitus ([place])   Streptococcal bacteremia   Upper GI bleed   Encephalopathy   #Acute encephalopathy of unclear etiology:Patient presented with a 2-day history of altered mentation including expressive aphasia, slow speech, and trouble swallowing and following commands.Initial concern for acute intracranial process and neurology consulted.  Head CT and MRI brain without evidence of acute intracranial process.  Neurology recommended EEG to assess for seizure activity.  Expressive aphasia appears to be resolved today and he is able to follow commands, but he continues to have significant delayed thought process.  He is also unable to recall yesterday's events. He remains afebrile but is tachycardic with HR in the 140s concerning for a systemic infectious process vs PE in the setting of recent surgery.  He has been on Ancef since 3/22 for Streptococcus sanguinous bacteremia during previous admission.  His repeat blood cultures were negative after starting antibiotic therapy.  Has been afebrile and hemodynamically stable at CIR, but lab work yesterday revealed a mild leukocytosis.  We are repeating blood cultures from both PIV and [place] line. CXR with LLL opacity (atelectasis vs PNA) but does not report respiratory symptoms.  We will continue Ancef as below and consider broadening antibiotic therapy if he decompensates clinically. - Neurology following, appreciate assistance and recommendations  - SLP evaluation - Follow up EEG  - Follow up blood cultures   -  Checking ammonia levels  - Spoke to niece Dixon Boos [phone]) at 1030AM and updated her on patient's status and workup. She would like to be updated daily.   # Normocytic anemia:Patient was noted to be anemic during previous admission that was thought to be secondary to acute blood loss after surgery for R BKA. While at CIR, his Hgb has continued to drop and required 2 units of blood  today. FOBT positive and he does report melena though he is unreliable due to confusion. GI consulted and recommended monitoring H/H and EGD on 4/1 if hemoglobin continues to drop.  Hemoglobin stable 9.9 this morning.  Per GI, no EGD tomorrow as hemoglobin is stable patient is undergoing workup for acute encephalopathy.   - GI signed off, recommended continuing supportive care - Will continue to monitor Hgb   # Chest pain: Continues to be tachycardic on exam, but denies chest pain this morning. Troponin negative x 3. Telemetry reviewed this morning with episodes of sinus tachycardia with HR up to 140s.  No A. fib noted. We will continue to monitor chest pain and telemetry. Unclear etiology for tachycardia, but high suspicion for ongoing systemic infectious process.  Infectious workup as above. Will order CTA chest to r/o PE given recent surgery for R BKA on 3/23. ABG with respiratory alkalosis. [place] 7.  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  CBC yesterday revealed a mild leukocytosis of 10.9.  We will continue to monitor.  We will get blood cultures as below.  We will continue current antibiotic therapy with Ancef and consider broadening if he becomes febrile and clinically decompensates. - Continue Ancef until 4/6 - Follow up blood cx from PIV and PICC line  - Consider broadening antibiotic therapy if patient becomes febrile and clinically decompensates  # HTN:  Continues to be hypertensive in the setting of holding antihypertensive medications due to stroke workup.  CT and brain MRI without evidence of acute acute process.  Will resume home medications. - Resume home losartan 25 mg QD  - Holding home HCTZ 12.5 mg QD and nifedipine 90 mg QD as concern for possible infectious etiology as the cause of  his acute encephalopathy  # Uncontrolled T2DM: Hemoglobin A1c 12.5. His home regimen was levemir 60u BID,metformin 1000mg  BID, and glipizide 10mg  BID. He was discharged on 3/26 onmetformin 1000mg  BID, levemir 40u QHS, and Novolog 10u TID.   CBGs at goal on current regimen.  Will continue as below. - Levemir 40U QHS  - SSI-S + CBG monitoring    # CKD Stage III: Presented with AKI during previous admission with Cr 2.8, which improved to 1.17 on day of discharge. No history of CKD prior. Most recent BMP on 3/27 with Cr 1.25. Renal function currently stable with Cr 1.1 today.  Will continue to monitor renal function.    Dispo: Anticipated discharge in approximately 2-4 day(s).   Welford Roche, MD 04/09/2017, 10:44 AM Pager: [phone]

## 2017-04-09 NOTE — Progress Notes (Signed)
Pt was transferred from [place]. Pt Alert but aphasic. VS taken. Pt placed on tele. NIH stroke scale completed. Neuro assessment initiated (See Epic). Pt is NPO

## 2017-04-09 NOTE — Progress Notes (Signed)
Pt's insulin coverage held; Pt is NPO. On call IM cover was made aware.

## 2017-04-09 NOTE — Evaluation (Signed)
Physical Therapy Evaluation Patient Details Name: Trevor [name] MRN: [id number] DOB: Sep 24, 1955 Today's Date: 04/09/2017   History of Present Illness  Patient is a 62 y/o male who was recently d/ced to CIR on 3/26 after Right BKA (7/02) complicated by bacteremia presents back with AMS- aphasia, difficulty swallowing and not able to follow commands. Head CT and MRI-unremarkable. PMH includes DM, HTN, CKD stage III, HLD, sleep apnea.  Clinical Impression  Patient presents with severe cognitive deficits- impaired communication (appears to have both expressive/receptive aphasia?), difficulty following commands consistently, apraxia, delayed processing, impaired attention- tachycardia and impaired mobility s/p above. Does well with repetition of cues mixed with demonstrational cues to perform functional tasks. Easily distracted. Able to perform lateral scoot transfer to chair with Min A with constant cues for sequencing. Recommend pt return to CIR to maximize independence and mobility prior to return home.     Follow Up Recommendations CIR    Equipment Recommendations  Other (comment)(defer to next venue)    Recommendations for Other Services       Precautions / Restrictions Precautions Precautions: Fall Required Braces or Orthoses: Other Brace/Splint Other Brace/Splint: Shrinker      Mobility  Bed Mobility Overal bed mobility: Needs Assistance Bed Mobility: Supine to Sit;Sit to Supine     Supine to sit: Min assist;HOB elevated Sit to supine: Min guard;HOB elevated   General bed mobility comments: +rail, increased time and effort, verbal/tactile cues for sequencing to perform task. Difficulty sequencing movement- got to EOB x2 and quickly returned to supine x2 despite cues to stay sitting up. Not able to follow verbal commands to sit EOB, needed tactile and gestural cues. Apraxia noted.  Transfers Overall transfer level: Needs assistance Equipment used: None Transfers:  Lateral/Scoot Transfers          Lateral/Scoot Transfers: Min assist General transfer comment: Able to scoot to chair towards left with arm rest lowered with constant cues for sequencing to scoot, if no cues, pt returning to bed.   Ambulation/Gait             General Gait Details: unable  Stairs            Wheelchair Mobility    Modified Rankin (Stroke Patients Only) Modified Rankin (Stroke Patients Only) Pre-Morbid Rankin Score: No significant disability Modified Rankin: Severe disability     Balance Overall balance assessment: Needs assistance Sitting-balance support: Feet supported;No upper extremity supported Sitting balance-Leahy Scale: Fair                                       Pertinent Vitals/Pain Pain Assessment: Faces Faces Pain Scale: No hurt    Home Living Family/patient expects to be discharged to:: Private residence Living Arrangements: Other relatives Available Help at Discharge: Family;Available PRN/intermittently Type of Home: House Home Access: Ramped entrance     Home Layout: One level Home Equipment: None      Prior Function Level of Independence: Independent               Hand Dominance        Extremity/Trunk Assessment   Upper Extremity Assessment Upper Extremity Assessment: Defer to OT evaluation    Lower Extremity Assessment Lower Extremity Assessment: RLE deficits/detail;LLE deficits/detail RLE Deficits / Details: s/p BKA with wound vac in place LLE Deficits / Details: Able to lift LLE off bed against gravity without difficulty.     Cervical /  Trunk Assessment Cervical / Trunk Assessment: Normal  Communication   Communication: Expressive difficulties  Cognition Arousal/Alertness: Awake/alert Behavior During Therapy: WFL for tasks assessed/performed Overall Cognitive Status: Impaired/Different from baseline Area of Impairment: Orientation;Attention;Problem solving;Following commands                  Orientation Level: Disoriented to;Time Current Attention Level: Sustained   Following Commands: Follows one step commands with increased time;Follows one step commands inconsistently     Problem Solving: Slow processing;Requires verbal cues;Difficulty sequencing;Requires tactile cues General Comments: Easily distracted. Requires repetition and demonstrational cues to perform tasks. Demonstrates some motor apraxia. Delayed response time and processing to answer questions.  Expressive/receptive deficits noted.       General Comments General comments (skin integrity, edema, etc.): HR ranged from 110-152 bpm during session.    Exercises     Assessment/Plan    PT Assessment Patient needs continued PT services  PT Problem List Decreased mobility;Decreased knowledge of precautions;Decreased activity tolerance;Pain;Decreased balance;Decreased knowledge of use of DME;Decreased cognition;Decreased skin integrity       PT Treatment Interventions DME instruction;Therapeutic activities;Therapeutic exercise;Gait training;Patient/family education;Balance training;Functional mobility training;Cognitive remediation;Neuromuscular re-education;Wheelchair mobility training    PT Goals (Current goals can be found in the Care Plan section)  Acute Rehab PT Goals Patient Stated Goal: not stated PT Goal Formulation: With patient Time For Goal Achievement: 04/23/17 Potential to Achieve Goals: Good    Frequency Min 3X/week   Barriers to discharge        Co-evaluation               AM-PAC PT "6 Clicks" Daily Activity  Outcome Measure Difficulty turning over in bed (including adjusting bedclothes, sheets and blankets)?: None Difficulty moving from lying on back to sitting on the side of the bed? : Unable Difficulty sitting down on and standing up from a chair with arms (e.g., wheelchair, bedside commode, etc,.)?: Unable Help needed moving to and from a bed to chair (including a  wheelchair)?: A Little Help needed walking in hospital room?: Total Help needed climbing 3-5 steps with a railing? : Total 6 Click Score: 11    End of Session Equipment Utilized During Treatment: Other (comment)(shrinker) Activity Tolerance: Treatment limited secondary to medical complications (Comment)(tachycardia) Patient left: in chair;with call bell/phone within reach;with chair alarm set Nurse Communication: Mobility status;Other (comment)(need for wound vac cord) PT Visit Diagnosis: Other abnormalities of gait and mobility (R26.89);Difficulty in walking, not elsewhere classified (R26.2);Apraxia (R48.2)    Time: 4656-8127 PT Time Calculation (min) (ACUTE ONLY): 24 min   Charges:   PT Evaluation $PT Eval Moderate Complexity: 1 Mod PT Treatments $Therapeutic Activity: 8-22 mins   PT G Codes:        Wray Kearns, PT, DPT [phone]    Trevor [name] 04/09/2017, 9:15 AM

## 2017-04-09 NOTE — Progress Notes (Addendum)
NEUROHOSPITALISTS - DAILY PROGRESS NOTE  SUBJECTIVE: No family is at the bedside. Patient is found laying in bed in NAD. Overall he feels his condition is unchanged. Voices no new complaints. No new/acute events reported overnight. Patient tell examiner, "You will witness a miracle today, when the weatherman gives you time off because of the weather, you will see a miracle today".  Physical Examination   Vitals:   04/09/17 0237 04/09/17 0430 04/09/17 0556 04/09/17 0839  BP: (!) 167/96 (!) 159/101 (!) 165/102 (!) 179/100  Pulse:    (!) 115  Resp:  18 16 18   Temp:   (!) 97.2 F (36.2 C) 98.8 F (37.1 C)  TempSrc:   Axillary Oral  SpO2: 99% 97% 98%   Weight:      Height:       General - Well nourished, well developed, in no apparent distress HEENT-  Normocephalic, Cardiovascular - Regular rate and rhythm  Respiratory -  No wheezing. Abdomen - soft and non-tender, Extremities- no edema. Right BKA  Neurological Examination  Mental Status: Alert, oriented to self only, thought content is off topic and not appropriate.  Speech fluent without evidence of aphasia, dysarthria or neglect.  Able to follow simple commands without difficulty, not cooperative with 2-3 step commands Cranial Nerves: II: Visual Fields appear full. Pupils are equal, round, and reactive to light.   III,IV, VI: EOMI without ptosis. No nystagmus.  +tracking V: Facial sensation appears symmetric to touch/ temperature VII: Facial movement is symmetric.  VIII: hearing is intact to voice X: does not cooperate for testing. No hoarseness or hypophonia noted XI: Shoulder shrug is symmetric. XII: does not cooperate for testing Motor: Tone is normal. Bulk is normal.  Does not full cooperate for strength testing. Antigravity in UE's.   RLE: Does not move to command. Guarding when examiner palpates thigh.  LLE: Withdraws briskly to noxious stimuli. Does not follow commands for movement/mimic  Sensor: withdraws  briskly to noxious stimuli throughout.  Cerebellar: not cooperate for testing Gait: not tested. No obvious truncal ataxia  Laboratory Results  CBC:  Recent Labs  Lab 04/08/17 1050 04/08/17 2108 04/09/17 0731  WBC 7.9 10.9* 9.3  HGB 9.3* 9.9* 9.5*  HCT 28.6* 30.4* 29.5*  MCV 81.9 82.4 83.3  PLT 506* 545* 541*   BMP: Recent Labs  Lab 04/03/17 0640 04/04/17 0557 04/05/17 1140 04/08/17 2108 04/09/17 0731  NA 136 138 136 137 138  K 3.6 3.7 3.6 3.4* 3.3*  CL 105 106 102 100* 99*  CO2 23 24 24 22 23   GLUCOSE 220* 168* 231* 158* 204*  BUN 19 21* 22* 11 8  CREATININE 1.30* 1.17 1.25* 1.17 1.17  CALCIUM 8.1* 8.0* 8.5* 8.8* 8.5*   Liver Function Tests:  Recent Labs  Lab 04/05/17 1140  AST 75*  ALT 44  ALKPHOS 77  BILITOT 0.5  PROT 6.2*  ALBUMIN 2.1*   Cardiac Enzymes:  Recent Labs  Lab 04/08/17 2108 04/09/17 0216 04/09/17 0731  CKTOTAL 69  --   --   TROPONINI <0.03 <0.03 <0.03   Microbiology:  Recent Results (from the past 240 hour(s))  Surgical pcr screen     Status: None   Collection Time: 03/30/17 12:22 PM  Result Value Ref Range Status   MRSA, PCR NEGATIVE NEGATIVE Final   Staphylococcus aureus NEGATIVE NEGATIVE Final    Comment: (NOTE) The Xpert SA Assay (FDA approved for NASAL specimens in patients 85 years of age and older), is one component of  a comprehensive surveillance program. It is not intended to diagnose infection nor to guide or monitor treatment. Performed at [facility], [place] [address] [place], [place] [address]   Aerobic/Anaerobic Culture (surgical/deep wound)     Status: None   Collection Time: 03/31/17  1:09 PM  Result Value Ref Range Status   Specimen Description ABSCESS RIGHT FOOT  Final   Special Requests PATIENT ON ANCEF  Final   Gram Stain   Final    ABUNDANT WBC PRESENT, PREDOMINANTLY PMN ABUNDANT GRAM POSITIVE COCCI MODERATE GRAM NEGATIVE RODS Performed at [facility], [place] [address], [place], [place]  [address]    Culture   Final    MODERATE STREPTOCOCCUS ANGINOSIS MODERATE BACTEROIDES FRAGILIS BETA LACTAMASE POSITIVE MODERATE PEPTOSTREPTOCOCCUS MICROS    Report Status 04/06/2017 FINAL  Final   Organism ID, Bacteria STREPTOCOCCUS ANGINOSIS  Final      Susceptibility   Streptococcus anginosis - MIC*    PENICILLIN 0.12 SENSITIVE Sensitive     CEFTRIAXONE 0.5 SENSITIVE Sensitive     ERYTHROMYCIN <=0.12 SENSITIVE Sensitive     LEVOFLOXACIN 0.5 SENSITIVE Sensitive     VANCOMYCIN 0.5 SENSITIVE Sensitive     * MODERATE STREPTOCOCCUS ANGINOSIS  Culture, blood (routine x 2)     Status: None   Collection Time: 04/01/17  4:12 PM  Result Value Ref Range Status   Specimen Description BLOOD RIGHT HAND  Final   Special Requests   Final    BOTTLES DRAWN AEROBIC ONLY Blood Culture adequate volume   Culture   Final    NO GROWTH 5 DAYS Performed at [facility], [place] [address], [place], [place] [address]    Report Status 04/06/2017 FINAL  Final  Culture, blood (routine x 2)     Status: None   Collection Time: 04/01/17  4:16 PM  Result Value Ref Range Status   Specimen Description BLOOD RIGHT HAND  Final   Special Requests   Final    BOTTLES DRAWN AEROBIC ONLY Blood Culture adequate volume   Culture   Final    NO GROWTH 5 DAYS Performed at [facility], [place] [address] [place], [place] [address]    Report Status 04/06/2017 FINAL  Final    Imaging Results  Dg Chest 2 View  Result Date: 04/08/2017 CLINICAL DATA:  Lethargy. EXAM: CHEST - 2 VIEW COMPARISON:  03/29/2017 FINDINGS: Right arm PICC line tip projects over the SVC. Mild cardiac enlargement a new opacity overlies the lateral left lower lobe. Right lung appears clear. IMPRESSION: 1. New opacity overlying the lateral left lower lobe may represent pneumonia or atelectasis. Electronically Signed   By: Kerby Moors M.D.   On: 04/08/2017 15:45   Ct Head Wo Contrast  Result Date: 04/08/2017 CLINICAL DATA:  Acute confusion.  EXAM: CT HEAD WITHOUT CONTRAST TECHNIQUE: Contiguous axial images were obtained from the base of the skull through the vertex without intravenous contrast. COMPARISON:  None. FINDINGS: Brain: There is no evidence of acute infarct, intracranial hemorrhage, mass, midline shift, or extra-axial fluid collection. The ventricles and sulci are normal. Vascular: No hyperdense vessel. Skull: No fracture or focal osseous lesion. Sinuses/Orbits: Visualized paranasal sinuses and mastoid air cells are clear. Visualized orbits are unremarkable. Other: None. IMPRESSION: Negative head CT. Electronically Signed   By: Logan Bores M.D.   On: 04/08/2017 19:18   Mr Brain Wo Contrast  Result Date: 04/09/2017 CLINICAL DATA:  Dysphagia, altered speech and altered mental status. History of  diabetes, recent osteomyelitis and RIGHT BKA, hypertension. EXAM: MRI HEAD WITHOUT CONTRAST TECHNIQUE: Multiplanar, multiecho pulse sequences of the brain and surrounding structures were obtained without intravenous contrast. COMPARISON:  CT HEAD April 08, 2017 FINDINGS: INTRACRANIAL CONTENTS: No reduced diffusion to suggest acute ischemia. No susceptibility artifact to suggest hemorrhage. The ventricles and sulci are normal for patient's age. A few punctate supratentorial white matter FLAIR T2 hyperintensities are nonspecific, normal for age. No suspicious parenchymal signal, masses, mass effect. No abnormal extra-axial fluid collections. No extra-axial masses. VASCULAR: Normal major intracranial vascular flow voids present at skull base. SKULL AND UPPER CERVICAL SPINE: No abnormal sellar expansion. No suspicious calvarial bone marrow signal. Craniocervical junction maintained. SINUSES/ORBITS: Trace paranasal sinus mucosal thickening without air-fluid levels. Mastoid air cells are well aerated. Included ocular globes and orbital contents are non-suspicious. OTHER: None. IMPRESSION: Normal noncontrast MRI of the head for age. Electronically Signed    By: Elon Alas M.D.   On: 04/09/2017 01:43    IMPRESSION AND PLAN   Assessment: 62 year old male with new onset of dense receptive and expressive aphasia. Presents with acute onset confusion and aphasia. All imaging thus far negative for acute findings.   Toxic/metabolic encephalopathy    MRI and CT negative for acute findings. EEG suggestive of encephalopathy, epileptiform activity was seen. No aphasia or obvious weakness noted on exam today. Remains confused and somewhat uncooperative for formal neurological testing. No seizure activity reported overnight. Unknown baseline Mentation and Psych history.Patient also being treated for osteomyelitis and on cefazolin which can also cause encephalopathy.   Check B12, TSH, B1 levels, folate Will empirically treat with high dose Thiamine x 3days     Hospital day # 0 FAMILY UPDATES: No family at bedside  TEAM UPDATES: Lucious Groves, DO    Assessment & plan discussed with with attending physician and they are in agreement.    Mary A Costello. ANP-C [facility] 04/09/2017, 10:21 AM   04/09/2017:  ATTENDING ASSESSMENT  Seen and examined the patient today. Patient does not recall event or why he is in the hospital. Keeps stating that a miracle will happen. No other focal neurological deficits on exam. Given recent history of blood loss anemia will check B12, folate, B1 levels.  EEG consistent with encephalopathy and MRI negative for acute stroke. Also on antibiotics and has infection which could play a role.    I reviewed the above history and exam as documented by ARNP, and agree with the above documentation as well as formulated plan.  Karena Addison Aroor MD [facility] [phone]  If 7pm to 7am, please call on call as listed on AMION.

## 2017-04-09 NOTE — Procedures (Signed)
EEG Report  Clinical History:  Altered mental status and confusion.  MRI Brain is negative.  Technical Summary:  A 19 channel digital EEG recording was performed using the 10-20 international system of electrode placement.  Bipolar and Referential montages were used.  The total recording time was approx 20 minutes.  Findings:  There is a posterior dominant rhythm of 9-10 Hz reactive to eye opening and closure. While awake, there is intermittent frontally predominant rhythmic delta activity (FIRDA) as well as theta frequency intermittent generalized slowing.  There are no epileptiform discharges or electrographic seizures.  Sleep is not recorded.   Impression:  This is an abnormal EEG.  There is evidence of intermittent moderate to severe generalized slowing of brain activity consistent with a global encephalopathy with involvement of deep midline structures (eg thalamus).  This may be due to etiologies such as  toxic, infectious, metabolic, or raised intracranial pressure.  Clinical correlation is advised.  The patient is not in non-convulsive status epilepticus.    Rogue Jury, MS, MD

## 2017-04-10 ENCOUNTER — Inpatient Hospital Stay (HOSPITAL_COMMUNITY): Payer: Self-pay

## 2017-04-10 ENCOUNTER — Observation Stay (HOSPITAL_COMMUNITY): Payer: Medicare Other

## 2017-04-10 ENCOUNTER — Inpatient Hospital Stay (HOSPITAL_COMMUNITY): Payer: Self-pay | Admitting: Occupational Therapy

## 2017-04-10 ENCOUNTER — Inpatient Hospital Stay (HOSPITAL_COMMUNITY): Payer: Self-pay | Admitting: Physical Therapy

## 2017-04-10 DIAGNOSIS — E876 Hypokalemia: Secondary | ICD-10-CM | POA: Diagnosis not present

## 2017-04-10 DIAGNOSIS — R7881 Bacteremia: Secondary | ICD-10-CM | POA: Diagnosis present

## 2017-04-10 DIAGNOSIS — I10 Essential (primary) hypertension: Secondary | ICD-10-CM | POA: Diagnosis not present

## 2017-04-10 DIAGNOSIS — E46 Unspecified protein-calorie malnutrition: Secondary | ICD-10-CM | POA: Diagnosis present

## 2017-04-10 DIAGNOSIS — R4701 Aphasia: Secondary | ICD-10-CM

## 2017-04-10 DIAGNOSIS — B955 Unspecified streptococcus as the cause of diseases classified elsewhere: Secondary | ICD-10-CM | POA: Diagnosis present

## 2017-04-10 DIAGNOSIS — H548 Legal blindness, as defined in USA: Secondary | ICD-10-CM | POA: Diagnosis present

## 2017-04-10 DIAGNOSIS — Z79899 Other long term (current) drug therapy: Secondary | ICD-10-CM | POA: Diagnosis not present

## 2017-04-10 DIAGNOSIS — G8918 Other acute postprocedural pain: Secondary | ICD-10-CM | POA: Diagnosis not present

## 2017-04-10 DIAGNOSIS — I129 Hypertensive chronic kidney disease with stage 1 through stage 4 chronic kidney disease, or unspecified chronic kidney disease: Secondary | ICD-10-CM | POA: Diagnosis present

## 2017-04-10 DIAGNOSIS — Z888 Allergy status to other drugs, medicaments and biological substances status: Secondary | ICD-10-CM | POA: Diagnosis not present

## 2017-04-10 DIAGNOSIS — T361X5A Adverse effect of cephalosporins and other beta-lactam antibiotics, initial encounter: Secondary | ICD-10-CM | POA: Diagnosis present

## 2017-04-10 DIAGNOSIS — D649 Anemia, unspecified: Secondary | ICD-10-CM | POA: Diagnosis not present

## 2017-04-10 DIAGNOSIS — K5900 Constipation, unspecified: Secondary | ICD-10-CM | POA: Diagnosis present

## 2017-04-10 DIAGNOSIS — G9349 Other encephalopathy: Secondary | ICD-10-CM | POA: Diagnosis not present

## 2017-04-10 DIAGNOSIS — K921 Melena: Secondary | ICD-10-CM | POA: Diagnosis present

## 2017-04-10 DIAGNOSIS — G4733 Obstructive sleep apnea (adult) (pediatric): Secondary | ICD-10-CM | POA: Diagnosis present

## 2017-04-10 DIAGNOSIS — D62 Acute posthemorrhagic anemia: Secondary | ICD-10-CM | POA: Diagnosis present

## 2017-04-10 DIAGNOSIS — E873 Alkalosis: Secondary | ICD-10-CM | POA: Diagnosis present

## 2017-04-10 DIAGNOSIS — Z794 Long term (current) use of insulin: Secondary | ICD-10-CM | POA: Diagnosis not present

## 2017-04-10 DIAGNOSIS — G92 Toxic encephalopathy: Secondary | ICD-10-CM | POA: Diagnosis present

## 2017-04-10 DIAGNOSIS — Y92230 Patient room in hospital as the place of occurrence of the external cause: Secondary | ICD-10-CM | POA: Diagnosis present

## 2017-04-10 DIAGNOSIS — S88111S Complete traumatic amputation at level between knee and ankle, right lower leg, sequela: Secondary | ICD-10-CM | POA: Diagnosis not present

## 2017-04-10 DIAGNOSIS — E1122 Type 2 diabetes mellitus with diabetic chronic kidney disease: Secondary | ICD-10-CM | POA: Diagnosis present

## 2017-04-10 DIAGNOSIS — R011 Cardiac murmur, unspecified: Secondary | ICD-10-CM | POA: Diagnosis present

## 2017-04-10 DIAGNOSIS — M869 Osteomyelitis, unspecified: Secondary | ICD-10-CM | POA: Diagnosis not present

## 2017-04-10 DIAGNOSIS — Z89511 Acquired absence of right leg below knee: Secondary | ICD-10-CM | POA: Diagnosis not present

## 2017-04-10 DIAGNOSIS — G4730 Sleep apnea, unspecified: Secondary | ICD-10-CM | POA: Diagnosis present

## 2017-04-10 DIAGNOSIS — Z4781 Encounter for orthopedic aftercare following surgical amputation: Secondary | ICD-10-CM | POA: Diagnosis present

## 2017-04-10 DIAGNOSIS — Z6837 Body mass index (BMI) 37.0-37.9, adult: Secondary | ICD-10-CM | POA: Diagnosis not present

## 2017-04-10 DIAGNOSIS — E785 Hyperlipidemia, unspecified: Secondary | ICD-10-CM | POA: Diagnosis present

## 2017-04-10 DIAGNOSIS — R195 Other fecal abnormalities: Secondary | ICD-10-CM | POA: Diagnosis present

## 2017-04-10 DIAGNOSIS — E1165 Type 2 diabetes mellitus with hyperglycemia: Secondary | ICD-10-CM | POA: Diagnosis present

## 2017-04-10 DIAGNOSIS — E1169 Type 2 diabetes mellitus with other specified complication: Secondary | ICD-10-CM | POA: Diagnosis not present

## 2017-04-10 DIAGNOSIS — G9340 Encephalopathy, unspecified: Secondary | ICD-10-CM | POA: Diagnosis present

## 2017-04-10 DIAGNOSIS — Z6834 Body mass index (BMI) 34.0-34.9, adult: Secondary | ICD-10-CM | POA: Diagnosis not present

## 2017-04-10 DIAGNOSIS — R1310 Dysphagia, unspecified: Secondary | ICD-10-CM

## 2017-04-10 DIAGNOSIS — Z947 Corneal transplant status: Secondary | ICD-10-CM | POA: Diagnosis not present

## 2017-04-10 DIAGNOSIS — E669 Obesity, unspecified: Secondary | ICD-10-CM | POA: Diagnosis present

## 2017-04-10 DIAGNOSIS — N183 Chronic kidney disease, stage 3 (moderate): Secondary | ICD-10-CM | POA: Diagnosis present

## 2017-04-10 DIAGNOSIS — Z79891 Long term (current) use of opiate analgesic: Secondary | ICD-10-CM | POA: Diagnosis not present

## 2017-04-10 DIAGNOSIS — E1142 Type 2 diabetes mellitus with diabetic polyneuropathy: Secondary | ICD-10-CM | POA: Diagnosis present

## 2017-04-10 LAB — GLUCOSE, CAPILLARY
GLUCOSE-CAPILLARY: 166 mg/dL — AB (ref 65–99)
GLUCOSE-CAPILLARY: 94 mg/dL (ref 65–99)
Glucose-Capillary: 72 mg/dL (ref 65–99)
Glucose-Capillary: 93 mg/dL (ref 65–99)

## 2017-04-10 LAB — CBC
HEMATOCRIT: 29.7 % — AB (ref 39.0–52.0)
HEMOGLOBIN: 9.6 g/dL — AB (ref 13.0–17.0)
MCH: 27.5 pg (ref 26.0–34.0)
MCHC: 32.3 g/dL (ref 30.0–36.0)
MCV: 85.1 fL (ref 78.0–100.0)
Platelets: 551 10*3/uL — ABNORMAL HIGH (ref 150–400)
RBC: 3.49 MIL/uL — ABNORMAL LOW (ref 4.22–5.81)
RDW: 16.6 % — ABNORMAL HIGH (ref 11.5–15.5)
WBC: 11.8 10*3/uL — AB (ref 4.0–10.5)

## 2017-04-10 LAB — BASIC METABOLIC PANEL
ANION GAP: 8 (ref 5–15)
BUN: 15 mg/dL (ref 6–20)
CHLORIDE: 102 mmol/L (ref 101–111)
CO2: 28 mmol/L (ref 22–32)
Calcium: 8.7 mg/dL — ABNORMAL LOW (ref 8.9–10.3)
Creatinine, Ser: 1.31 mg/dL — ABNORMAL HIGH (ref 0.61–1.24)
GFR calc non Af Amer: 58 mL/min — ABNORMAL LOW (ref >60)
Glucose, Bld: 176 mg/dL — ABNORMAL HIGH (ref 65–99)
POTASSIUM: 3.4 mmol/L — AB (ref 3.5–5.1)
Sodium: 138 mmol/L (ref 135–145)

## 2017-04-10 LAB — TSH: TSH: 3.913 u[IU]/mL (ref 0.350–4.500)

## 2017-04-10 LAB — VITAMIN B12: Vitamin B-12: 1119 pg/mL — ABNORMAL HIGH (ref 180–914)

## 2017-04-10 MED ORDER — SODIUM CHLORIDE 0.9 % IV SOLN
3.0000 g | Freq: Four times a day (QID) | INTRAVENOUS | Status: DC
  Administered 2017-04-10 – 2017-04-12 (×8): 3 g via INTRAVENOUS
  Filled 2017-04-10 (×12): qty 3

## 2017-04-10 MED ORDER — NIFEDIPINE ER OSMOTIC RELEASE 90 MG PO TB24
90.0000 mg | ORAL_TABLET | Freq: Every day | ORAL | Status: DC
  Administered 2017-04-10 – 2017-04-12 (×3): 90 mg via ORAL
  Filled 2017-04-10 (×5): qty 1

## 2017-04-10 NOTE — Progress Notes (Signed)
Carotid artery duplex has been completed. 1-39% ICA stenosis bilaterally.  04/10/17 12:20 PM Trevor [name] RVT

## 2017-04-10 NOTE — Progress Notes (Addendum)
Subjective:  No acute events overnight.  Patient remains confused, aphasic.   Objective:  Vital signs in last 24 hours: Vitals:   04/09/17 2025 04/10/17 0009 04/10/17 0100 04/10/17 0408  BP: (!) 162/98 (!) 205/100 (!) 168/105 (!) 182/100  Pulse: (!) 105 98 (!) 104 (!) 105  Resp: 18 18 17 20   Temp: 98.3 F (36.8 C) 98.1 F (36.7 C)  98.5 F (36.9 C)  TempSrc: Oral Oral  Oral  SpO2: 98% 99% 98% 99%  Weight:      Height:       Physical Exam  Constitutional:  Elderly, obese male sitting up in chair in no acute distress.  HENT:  Head: Normocephalic and atraumatic.  Eyes: EOM are normal. No scleral icterus.  Neck: Normal range of motion. Neck supple.  Cardiovascular: Regular rhythm and normal heart sounds. Tachycardia present. Exam reveals no gallop and no friction rub.  No murmur heard. Pulmonary/Chest: Breath sounds normal. No respiratory distress. He has no wheezes. He has no rales.  Abdominal: Soft. Bowel sounds are normal. He exhibits no distension. There is no tenderness.  Musculoskeletal: He exhibits deformity (S/p R BKA). He exhibits no edema.  Neurological: He is alert.  Patient is alert and oriented to self and place.  Thought process remains delayed and he will also say phrases that are not relevant to the question posed.   He is able to follow commands.  No cranial nerve deficits noted.  Normal strength, No focal weakness   CBC Latest Ref Rng & Units 04/10/2017 04/09/2017 04/08/2017  WBC 4.0 - 10.5 K/uL 11.8(H) 9.3 10.9(H)  Hemoglobin 13.0 - 17.0 g/dL 9.6(L) 9.5(L) 9.9(L)  Hematocrit 39.0 - 52.0 % 29.7(L) 29.5(L) 30.4(L)  Platelets 150 - 400 K/uL 551(H) 541(H) 545(H)     Assessment/Plan:  Principal Problem:   Other encephalopathy Active Problems:   Essential hypertension   Uncontrolled type 2 diabetes mellitus with hyperglycemia, with long-term current use of insulin (HCC)   CKD stage 3 due to type 2 diabetes mellitus (HCC)   Streptococcal bacteremia   Upper  GI bleed   Encephalopathy   Acute encephalopathy of unclear etiology:Patient presented with a 2-day history of altered mentation including expressive aphasia, slow speech, and trouble swallowing and following commands.Initial concern for acute intracranial process and neurology consulted.  Head CT and MRI brain without evidence of acute intracranial process.  Neurology recommended EEG to assess for seizure activity.  Expressive aphasia appears to be resolved today and he is able to follow commands, but he continues to have significant delayed thought process.  He is also unable to recall yesterday's events. He remains afebrile but is tachycardic with HR in the 140s concerning for a systemic infectious process vs PE in the setting of recent surgery.  He has been on Ancef since 3/22 for Streptococcus sanguinous bacteremia during previous admission.  His repeat blood cultures were negative after starting antibiotic therapy.  Has been afebrile and hemodynamically stable at CIR, but lab work yesterday revealed a mild leukocytosis.  We are repeating blood cultures from both PIV and [place] line. CXR with LLL opacity (atelectasis vs PNA) but does not report respiratory symptoms.  We will continue Ancef as below and consider broadening antibiotic therapy if he decompensates clinically.  - Neurology following, appreciate assistance and recommendations  - ancef continued - passed swallow eval - EEG shows intermittent moderate to severe generalized slowing consistent with global encephalopathy ddx toxic, infectious, metabolic, increased ICP - Blood cultures recollected 3/30 no  results yet   - WBC 11.8k today - ammonia level normal - ECHO pending - Further workup to include lumbar puncture  - Will attempt to speak to niece Dixon Boos [phone])   Normocytic anemia:Patient was noted to be anemic during previous admission that was thought to be secondary to acute blood loss after surgery for R BKA. While  at CIR, his Hgb has continued to drop and required 2 units of blood today. FOBT positive and he does report melena though he is unreliable due to confusion. GI consulted and recommended monitoring H/H and EGD on 4/1 if hemoglobin continues to drop.  Hemoglobin stable.  Per GI, no EGD  as hemoglobin is stable patient is undergoing workup for acute encephalopathy.    - GI signed off, recommended continuing supportive care - Will continue to monitor Hgb   Chest pain: Continues to be tachycardic on exam, but denies chest pain this morning. Troponin negative x 3. high suspicion for ongoing systemic infectious process causing tachycardia.  Infectious workup as above. Will order CTA chest to r/o PE given recent surgery for R BKA on 3/23. ABG with respiratory alkalosis. [place] 7.  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  CBC yesterday revealed a mild leukocytosis of 10.9.  We will continue to monitor.  We will get blood cultures as below.  We will continue current antibiotic therapy with Ancef and consider broadening if he becomes febrile and clinically decompensates. - Continue Ancef until 4/6 - Follow up blood cx from PIV and PICC line  - Consider broadening antibiotic therapy if patient becomes febrile and clinically decompensates  HTN:  Continues to be hypertensive in the setting of holding antihypertensive medications due to stroke workup.  CT and brain MRI without evidence of acute acute process.  Will resume home medications. - Resume home losartan 25 mg QD - restarted home nifedipine 90mg  - Holding home HCTZ 12.5 mg QD    Uncontrolled T2DM: Hemoglobin A1c 12.5. His home regimen was levemir 60u BID,metformin 1000mg  BID, and glipizide 10mg  BID. He was discharged on 3/26 onmetformin  1000mg  BID, levemir 40u QHS, and Novolog 10u TID.   CBGs at goal on current regimen.  Will continue as below. - Levemir 40U QHS  - SSI-S + CBG monitoring    CKD Stage III: Presented with AKI during previous admission with Cr 2.8, which improved to 1.17 on day of discharge. No history of CKD prior. Most recent BMP on 3/27 with Cr 1.25. Renal function currently stable.   -continue to monitor  Dispo: Anticipated discharge in approximately 2-4 day(s).   Katherine Roan, MD 04/10/2017, 6:36 AM Vickki Muff MD PGY-1 Internal Medicine Pager # [phone]

## 2017-04-10 NOTE — Plan of Care (Signed)
  Problem: Health Behavior/Discharge Planning: Goal: Ability to manage health-related needs will improve Outcome: Progressing   Problem: Education: Goal: Knowledge of disease or condition will improve Outcome: Progressing   

## 2017-04-10 NOTE — Discharge Summary (Signed)
NAMEMIKLE, STERNBERG               ACCOUNT NO.:  [id number]  MEDICAL RECORD NO.:  [id number]  LOCATION:                                 FACILITY:  PHYSICIAN:  Delice Lesch, MD        DATE OF BIRTH:  [birth year]  DATE OF ADMISSION:  04/04/2017 DATE OF DISCHARGE:  04/08/2017                              DISCHARGE SUMMARY   DISCHARGE DIAGNOSES: 1. 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 2. Encephalopathy. 3. Subcutaneous Lovenox for deep venous thrombosis prophylaxis. 4. Acute blood loss anemia. 5. Blood cultures viridans strep bacteremia. 6. Hypertension. 7. Diabetes mellitus. 8. Peripheral neuropathy. 9. Legally blind. 10.Constipation.  HISTORY OF PRESENT ILLNESS:  This is a 62 year old right-handed male with history of diabetes mellitus, hypertension, legally blind, and CKD, stage 3, who lives with his nephew.  Presented on March 29, 2017, with continued progressive ischemic changes.  Osteomyelitis abscess of the right foot after right first  __________ ray amputation on March 31, 2017, and later underwent right transtibial amputation on April 01, 2017, per Dr. Sharol Given.  Hospital course, pain management transitioned from intravenous narcotics to oral Dilaudid.  Wound cultures had a VAC applied for 1 week postoperatively.  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  Blood cultures showed strep viridans bacteremia.  Placed on intravenous Ancef through April 15, 2017.  Acute blood loss anemia 9.7 and monitored.  Subcutaneous Lovenox for DVT prophylaxis.  Creatinine monitored.  CKD, stage 3.  Physical and occupational therapy ongoing. The patient was admitted for comprehensive rehab program.  PAST MEDICAL HISTORY:  See discharge diagnoses.  SOCIAL HISTORY:  Lives with nephew.  Nephew attends school and works. Functional status upon admission to rehab services was minimal guard sit to supine, minimal guard supine to sit, mod to max assist activities of daily living.  PHYSICAL  EXAMINATION:  VITAL SIGNS:  Blood pressure 153/86, pulse 103, temperature 98, and respirations 18. GENERAL:  This was an alert male, mood was flat, but appropriate. Legally blind.  He could provide his name and age. EXTREMITIES:  Bilateral upper extremities 5/5.  Right AKA site was dressed.  REHABILITATION HOSPITAL COURSE:  The patient was admitted to Inpatient Rehab Services.  Therapies initiated on a 3-hour daily basis, consisting of physical therapy, occupational therapy, and rehabilitation nursing. The following issues were addressed during the patient's rehabilitation stay.  Pertaining to Mr. Moat's right transtibial amputation on April 01, 2017, surgical site healing nicely.  He would follow up Dr. Sharol Given. Initially on subcutaneous Lovenox for DVT prophylaxis.  Discontinued on March 28, due to black tarry stools of which Gastroenterology was consulted.  He did require transfusion and supportive care.  PPI is advised.  Due to the fact the patient had only 1 episode of melena, would like to see how things went prior to any plan for endoscopy at this time.  As noted, the patient had been transfused.  Blood pressures were monitored.  Continued on Cozaar and nifedipine.  CKD, stage 3. Monitoring of renal function.  Hemoglobin A1c of 12.5.  Insulin therapy is recommended.  Noted on April 08, 2017, the patient more somnolent. Rapid response consulted.  CBC had improved to 9.3.  Urine culture was pending.  Troponin negative.  Neurology Service was consulted.  MRI and EEG were ordered.  Due to the patient's change in mental status, he was discharged to [name] for ongoing monitoring.  All medication changes made as per medicine service.     Lauraine Rinne, P.A.   ______________________________ Delice Lesch, MD    DA/MEDQ  D:  04/09/2017  T:  04/09/2017  Job:  [id number]

## 2017-04-10 NOTE — Evaluation (Signed)
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 Head CT and MRI-unremarkable. PMH includes DM, HTN, CKD stage III, HLD, sleep apnea.   Clinical Impression   PTA, pt was independent prior to recent admission for BKA and dc to CIR. Pt currently requiring set up and supervision for grooming at bed level and Max A for LB ADLs at bed level. Pt presenting with significant cognitive deficits and perseverating on specific topics. Pt becoming agitated when therapist touched IV and stated "Lose. License." Pt will require acute OT to increase safety and independence with ADLs and functional mobility. Recommend dc to CIR for intensive OT to optimize safety and independence with ADLs, decreased caregiver burden, and facilitate progress to Mod I level before transitioning home.     Follow Up Recommendations  CIR;Supervision/Assistance - 24 hour    Equipment Recommendations  Other (comment)(Defer to next venue)    Recommendations for Other Services Rehab consult;PT consult;Speech consult     Precautions / Restrictions Precautions Precautions: Fall Required Braces or Orthoses: Other Brace/Splint Other Brace/Splint: Shrinker Restrictions Weight Bearing Restrictions: Yes RLE Weight Bearing: Non weight bearing      Mobility Bed Mobility               General bed mobility comments: Pt declining bed mobility. Able to adduct LLE with tactile cues  Transfers                 General transfer comment: Declining to sit in bed repeating "mistake, mistake. mistake"    Balance                                           ADL either performed or assessed with clinical  judgement   ADL Overall ADL's : Needs assistance/impaired   Eating/Feeding Details (indicate cue type and reason): Pt stating "I hate the food" when moving his table to bedside.  Grooming: Dance movement psychotherapist;Set up;Bed level Grooming Details (indicate cue type and reason): Pt washing his face at bed level demosntrating WFL for BUEs. Pt able to follow simple cue to "take wash clothe" and "wash your face"             Lower Body Dressing: Maximal assistance;Bed level Lower Body Dressing Details (indicate cue type and reason): Pt able to elevate LLE for donning sock with Max A.               General ADL Comments: Limited evaluation due to pt aggitation and perseveration. Pt with increased awarness of deficits and became frustrated with expressive aphasia.     Vision Baseline Vision/History: Legally blind;Wears glasses Wears Glasses: (wears contacts, has glasses here) Vision Assessment?: Vision impaired- to be further tested in functional context Additional Comments: Per chart, pt legally blind. Pt able to make eye contact duirng session and tracking therapist arounsa room     Perception     Praxis      Pertinent Vitals/Pain Pain Assessment: No/denies pain Pain Intervention(s): Monitored during session     Hand Dominance Right   Extremity/Trunk Assessment Upper Extremity Assessment Upper Extremity Assessment: Overall WFL for tasks assessed  Lower Extremity Assessment Lower Extremity Assessment: Defer to PT evaluation RLE Deficits / Details: s/p BKA with wound vac in place LLE Deficits / Details: Able to lift LLE off bed against gravity without difficulty.    Cervical / Trunk Assessment Cervical / Trunk Assessment: Normal   Communication Communication Communication: Expressive difficulties   Cognition Arousal/Alertness: Awake/alert Behavior During Therapy: WFL for tasks assessed/performed Overall Cognitive Status: Impaired/Different from baseline(unable to fully  assess due to expressive language deficits) Area of Impairment: Orientation;Attention;Problem solving;Following commands;Awareness                 Orientation Level: Disoriented to;Time Current Attention Level: Focused   Following Commands: Follows one step commands inconsistently   Awareness: Intellectual Problem Solving: Slow processing;Requires verbal cues;Difficulty sequencing;Requires tactile cues;Decreased initiation General Comments: Perseverating on multiple topics including "echo location" "lose your lisence" "please remember you are part of the miracle". Inconsistantly answering simple questions. Pt becoming aggitated when therpist would silence IV.    General Comments       Exercises     Shoulder Instructions      Home Living Family/patient expects to be discharged to:: Private residence Living Arrangements: Other relatives Available Help at Discharge: Family;Available PRN/intermittently Type of Home: House Home Access: Ramped entrance     Home Layout: One level     Bathroom Shower/Tub: Tub/shower unit;Curtain   Biochemist, clinical: Standard     Home Equipment: None      Lives With: Family    Prior Functioning/Environment Level of Independence: Independent        Comments: Independent prior to BKA and dc to CIR.         OT Problem List: Decreased activity tolerance;Impaired balance (sitting and/or standing);Decreased cognition      OT Treatment/Interventions: Self-care/ADL training;Therapeutic exercise;Energy conservation;DME and/or AE instruction;Therapeutic activities;Patient/family education    OT Goals(Current goals can be found in the care plan section) Acute Rehab OT Goals Patient Stated Goal: not stated OT Goal Formulation: With patient Time For Goal Achievement: 04/24/17 Potential to Achieve Goals: Good ADL Goals Pt Will Perform Grooming: with min guard assist;sitting Pt Will Perform Upper Body Dressing: with min guard  assist;sitting Pt Will Transfer to Toilet: with mod assist;with transfer board;bedside commode(drop arm) Additional ADL Goal #1: Pt will perform bed mobility with Min A +2 in preparation for ADLs  OT Frequency: Min 3X/week   Barriers to D/C:            Co-evaluation              AM-PAC PT "6 Clicks" Daily Activity     Outcome Measure Help from another person eating meals?: A Little Help from another person taking care of personal grooming?: A Little Help from another person toileting, which includes using toliet, bedpan, or urinal?: A Lot Help from another person bathing (including washing, rinsing, drying)?: A Lot Help from another person to put on and taking off regular upper body clothing?: A Lot Help from another person to put on and taking off regular lower body clothing?: A Lot 6 Click Score: 14   End of Session    Activity Tolerance:   Patient left:    OT Visit Diagnosis: Unsteadiness on feet (R26.81);Other abnormalities of gait and mobility (R26.89);Other symptoms and signs involving cognitive function                Time: 0922-0953 OT Time Calculation (min): 31 min Charges:  OT General Charges $OT Visit: 1 Visit OT Evaluation $OT Eval  Moderate Complexity: 1 Mod G-Codes:     Myonna Chisom MSOT, OTR/L Acute Rehab Pager: [phone] Office: [place] 04/10/2017, 10:14 AM

## 2017-04-10 NOTE — Plan of Care (Signed)
progressing 

## 2017-04-10 NOTE — Progress Notes (Signed)
Physical Therapy Treatment Patient Details Name: Trevor [name] MRN: [id number] DOB: 08-09-1955 Today's Date: 04/10/2017    History of Present Illness Patient is a 62 y/o male who was recently d/ced to CIR on 3/26 after Right BKA (2/70) complicated by bacteremia presents back with AMS- aphasia, difficulty swallowing and not able to follow commands. Head CT and MRI-unremarkable. EEG-abnormal global encephalopathy. PMH includes DM, HTN, CKD stage III, HLD, sleep apnea.    PT Comments    Patient emotionally labile upon arrival due to having church member present praying with him. Session limited today secondary to irritation and perseveration of certain phrases leading to agitation with pt yelling "mistake, mistake," "lose your license, " with any sort of progression with mobility or movement of lines. Able to follow simple 1 step commands inconsistently with repetition and tactile cues. More verbal today but not always appropriate or in context. Impaired attention and awareness. Does well with automatic tasks- able to wash face with wash cloth and lift LE when sock held up. Easily distracted. Still with aphasia. Refused any mobility despite max cues and effort for greater than 15 mins. Will follow.   Follow Up Recommendations  CIR     Equipment Recommendations  None recommended by PT    Recommendations for Other Services       Precautions / Restrictions Precautions Precautions: Fall Required Braces or Orthoses: Other Brace/Splint Other Brace/Splint: Shrinker Restrictions Weight Bearing Restrictions: Yes RLE Weight Bearing: Non weight bearing    Mobility  Bed Mobility               General bed mobility comments: Attempted mobility with max verbal and tactile cues, able to initiate movement of LLE but moved back into bed each attempted movement. Declined any further movement after 15 mins of effort.   Transfers                 General transfer comment: Declining to  sit in bed repeating "mistake, mistake. mistake"  Ambulation/Gait                 Stairs            Wheelchair Mobility    Modified Rankin (Stroke Patients Only) Modified Rankin (Stroke Patients Only) Pre-Morbid Rankin Score: No significant disability Modified Rankin: Severe disability     Balance Overall balance assessment: No apparent balance deficits (not formally assessed)                                          Cognition Arousal/Alertness: Awake/alert Behavior During Therapy: (irritated; emotionally labile) Overall Cognitive Status: Impaired/Different from baseline Area of Impairment: Orientation;Attention;Problem solving;Following commands;Awareness                 Orientation Level: Disoriented to;Time Current Attention Level: Focused   Following Commands: (able to lift RLE with tactile cues to donn sock)   Awareness: Intellectual Problem Solving: Slow processing;Requires verbal cues;Difficulty sequencing;Requires tactile cues;Decreased initiation General Comments: Perseverating on multiple topics including "echo location" "lose your license" "please remember you are part of the miracle".  Inconsistantly answering simple questions. Easily distracted. Pt becoming irritated and agitated when therapist would silence IV or move foley bag. Squeezing therapist's hands aggressviely at times during irritation.       Exercises      General Comments        Pertinent Vitals/Pain Pain Assessment: Faces  Faces Pain Scale: No hurt Pain Intervention(s): Monitored during session    Home Living Family/patient expects to be discharged to:: Private residence Living Arrangements: Other relatives Available Help at Discharge: Family;Available PRN/intermittently Type of Home: House Home Access: Ramped entrance   Home Layout: One level Home Equipment: None      Prior Function Level of Independence: Independent      Comments:  Independent prior to BKA and dc to CIR.    PT Goals (current goals can now be found in the care plan section) Acute Rehab PT Goals Patient Stated Goal: not stated Progress towards PT goals: Not progressing toward goals - comment(secondary to irritation, not cooperative)    Frequency    Min 3X/week      PT Plan Current plan remains appropriate    Co-evaluation              AM-PAC PT "6 Clicks" Daily Activity  Outcome Measure  Difficulty turning over in bed (including adjusting bedclothes, sheets and blankets)?: None Difficulty moving from lying on back to sitting on the side of the bed? : Unable Difficulty sitting down on and standing up from a chair with arms (e.g., wheelchair, bedside commode, etc,.)?: Unable Help needed moving to and from a bed to chair (including a wheelchair)?: A Little Help needed walking in hospital room?: Total Help needed climbing 3-5 steps with a railing? : Total 6 Click Score: 11    End of Session Equipment Utilized During Treatment: Other (comment)(shrinker) Activity Tolerance: Treatment limited secondary to agitation Patient left: in bed;with call bell/phone within reach;with bed alarm set Nurse Communication: Other (comment)(irritation; not cooperative) PT Visit Diagnosis: Other abnormalities of gait and mobility (R26.89);Difficulty in walking, not elsewhere classified (R26.2);Apraxia (R48.2)     Time: 4193-7902 PT Time Calculation (min) (ACUTE ONLY): 29 min  Charges:  $Therapeutic Activity: 8-22 mins                    G Codes:       Trevor [name], PT, DPT [phone]     Trevor [name] 04/10/2017, 10:26 AM

## 2017-04-10 NOTE — Progress Notes (Signed)
Subjective: Continues to be encephaloapthic  Exam: Vitals:   04/10/17 0408 04/10/17 0816  BP: (!) 182/100 (!) 143/104  Pulse: (!) 105 95  Resp: 20 14  Temp: 98.5 F (36.9 C) 98.4 F (36.9 C)  SpO2: 99% 99%   Gen: In bed, NAD Resp: non-labored breathing, no acute distress Abd: soft, nt  Neuro: MS: Awake, perseverative sepech, but when he does have an idea, he communicates it clearly with fluent speech.  CN:VFF, face symmetric Motor: moves all extremities well.  Sensory:intact to LT  Pertinent Labs: CMP - cr 1.3 Ammonia 20 TSH normal.   Impression: 62 yo M with likely gram negative associated encephalopathy.  Also possible given his renal dysfunction  Would be cephalosporin associated neurotoxicity which would fit with the timeframe. CNS infection is less likley, but I do think that given AMS with known bacteremia, CSF sampling would be reasonable.   Recommendations: 1) Agree with LP for protein, glucose, cells, hsv, cultures.  2) If no other cause identified, would consider changing antibiotics to a non-fluoroquinalone antibiotic.  3) Will continue to follow  Roland Rack, MD [facility] [phone]  If 7pm- 7am, please page neurology on call as listed in [place].

## 2017-04-11 ENCOUNTER — Encounter (HOSPITAL_COMMUNITY): Payer: Self-pay | Admitting: General Practice

## 2017-04-11 DIAGNOSIS — M869 Osteomyelitis, unspecified: Secondary | ICD-10-CM

## 2017-04-11 DIAGNOSIS — Z8619 Personal history of other infectious and parasitic diseases: Secondary | ICD-10-CM

## 2017-04-11 DIAGNOSIS — N183 Chronic kidney disease, stage 3 (moderate): Secondary | ICD-10-CM

## 2017-04-11 DIAGNOSIS — I129 Hypertensive chronic kidney disease with stage 1 through stage 4 chronic kidney disease, or unspecified chronic kidney disease: Secondary | ICD-10-CM

## 2017-04-11 DIAGNOSIS — G9349 Other encephalopathy: Secondary | ICD-10-CM

## 2017-04-11 DIAGNOSIS — E1169 Type 2 diabetes mellitus with other specified complication: Secondary | ICD-10-CM

## 2017-04-11 DIAGNOSIS — E1122 Type 2 diabetes mellitus with diabetic chronic kidney disease: Secondary | ICD-10-CM

## 2017-04-11 DIAGNOSIS — D649 Anemia, unspecified: Secondary | ICD-10-CM

## 2017-04-11 LAB — COMPREHENSIVE METABOLIC PANEL
ALK PHOS: 58 U/L (ref 38–126)
ALT: 17 U/L (ref 17–63)
ANION GAP: 11 (ref 5–15)
AST: 30 U/L (ref 15–41)
Albumin: 2.4 g/dL — ABNORMAL LOW (ref 3.5–5.0)
BILIRUBIN TOTAL: 0.4 mg/dL (ref 0.3–1.2)
BUN: 10 mg/dL (ref 6–20)
CALCIUM: 8.8 mg/dL — AB (ref 8.9–10.3)
CO2: 29 mmol/L (ref 22–32)
CREATININE: 1.14 mg/dL (ref 0.61–1.24)
Chloride: 102 mmol/L (ref 101–111)
GFR calc non Af Amer: >60 mL/min (ref >60)
GLUCOSE: 158 mg/dL — AB (ref 65–99)
Potassium: 3.1 mmol/L — ABNORMAL LOW (ref 3.5–5.1)
Sodium: 142 mmol/L (ref 135–145)
Total Protein: 5.7 g/dL — ABNORMAL LOW (ref 6.5–8.1)

## 2017-04-11 LAB — GLUCOSE, CAPILLARY
GLUCOSE-CAPILLARY: 90 mg/dL (ref 65–99)
Glucose-Capillary: 117 mg/dL — ABNORMAL HIGH (ref 65–99)
Glucose-Capillary: 145 mg/dL — ABNORMAL HIGH (ref 65–99)
Glucose-Capillary: 322 mg/dL — ABNORMAL HIGH (ref 65–99)
Glucose-Capillary: 95 mg/dL (ref 65–99)

## 2017-04-11 LAB — FOLATE RBC
FOLATE, RBC: 1043 ng/mL (ref >498)
Folate, Hemolysate: 331.6 ng/mL
HEMATOCRIT: 31.8 % — AB (ref 37.5–51.0)

## 2017-04-11 MED ORDER — POTASSIUM CHLORIDE 20 MEQ PO PACK
40.0000 meq | PACK | Freq: Two times a day (BID) | ORAL | Status: AC
  Administered 2017-04-11: 40 meq via ORAL
  Filled 2017-04-11 (×2): qty 2

## 2017-04-11 MED ORDER — TRAMADOL HCL 50 MG PO TABS
50.0000 mg | ORAL_TABLET | Freq: Once | ORAL | Status: AC
  Administered 2017-04-11: 50 mg via ORAL
  Filled 2017-04-11: qty 1

## 2017-04-11 MED ORDER — KETOROLAC TROMETHAMINE 15 MG/ML IJ SOLN
15.0000 mg | Freq: Once | INTRAMUSCULAR | Status: AC
  Administered 2017-04-11: 15 mg via INTRAVENOUS
  Filled 2017-04-11: qty 1

## 2017-04-11 NOTE — Care Management Note (Signed)
Case Management Note  Patient Details  Name: Trevor [name] MRN: [id number] Date of Birth: September 24, 1955  Subjective/Objective:     Pt admitted with encephalopathy. He was admitted from CIR. He has a supportive niece and sister.    Pt will continue to need IV therapy at d/c and currently still has wound vac.            Action/Plan: PT/OT recommending return to CIR when medically ready. CM following for d/c disposition.   Expected Discharge Date:                  Expected Discharge Plan:  [place]  In-House Referral:     Discharge planning Services  CM Consult  Post Acute Care Choice:    Choice offered to:     DME Arranged:    DME Agency:     HH Arranged:    [place] Agency:     Status of Service:  In process, will continue to follow  If discussed at Long Length of Stay Meetings, dates discussed:    Additional Comments:  Pollie Friar, RN 04/11/2017, 10:47 AM

## 2017-04-11 NOTE — Progress Notes (Signed)
Occupational Therapy Treatment Patient Details Name: Trevor [name] MRN: [id number] DOB: October 02, 1955 Today's Date: 04/11/2017    History of present illness Patient is a 62 y/o male who was recently d/ced to CIR on 3/26 after Right BKA (8/65) complicated by bacteremia presents back with AMS- aphasia, difficulty swallowing and not able to follow commands. Head CT and MRI-unremarkable. EEG-abnormal global encephalopathy. PMH includes DM, HTN, CKD stage III, HLD, sleep apnea.   OT comments    Follow Up Recommendations  CIR;Supervision/Assistance - 24 hour    Equipment Recommendations  Other (comment)    Recommendations for Other Services Rehab consult;PT consult;Speech consult    Precautions / Restrictions Precautions Precautions: Fall Required Braces or Orthoses: Other Brace/Splint Other Brace/Splint: Shrinker Restrictions Weight Bearing Restrictions: Yes RLE Weight Bearing: Non weight bearing       Mobility Bed Mobility Overal bed mobility: Needs Assistance      pt refused           Transfers                 General transfer comment: declined        ADL either performed or assessed with clinical judgement   ADL Overall ADL's : Needs assistance/impaired Eating/Feeding: Minimal assistance;Bed level   Grooming: Wash/dry face;Sitting;Supervision/safety                                 General ADL Comments: encouraged pt to sit EOB.  Pt initially agreed then declined.  Re Explained role of OT ann importance of participation. Pt agreed to BUE Exercise.  Pt provided with blue theraband and instructed in use of shoulder flexion. Pt was agreable 1 st 2 exercises then became agitated when RN came in the room and he didnt invite him in.  OT session ended at this point               Cognition Arousal/Alertness: Awake/alert Behavior During Therapy: Agitated Overall Cognitive Status: Impaired/Different from baseline                                           Exercises General Exercises - Upper Extremity Shoulder Flexion: AROM;Theraband;10 reps;Both;Supine Theraband Level (Shoulder Flexion): Level 4 (Blue)   Shoulder Instructions       General Comments      Pertinent Vitals/ Pain       Pain Score: 3  Pain Location: RLE Pain Descriptors / Indicators: Sore Pain Intervention(s): Monitored during session     Prior Functioning/Environment              Frequency  Min 3X/week        Progress Toward Goals  OT Goals(current goals can now be found in the care plan section)  Progress towards OT goals: OT to reassess next treatment     Plan Discharge plan remains appropriate          End of Session    OT Visit Diagnosis: Unsteadiness on feet (R26.81);Other abnormalities of gait and mobility (R26.89);Other symptoms and signs involving cognitive function   Activity Tolerance Treatment limited secondary to agitation   Patient Left in bed;with call bell/phone within reach;with family/visitor present;with bed alarm set   Nurse Communication Other (comment)(pts agitation)        Time: 7846-9629 OT Time Calculation (min): 25 min  Charges:  OT General Charges $OT Visit: 1 Visit OT Treatments $Self Care/Home Management : 8-22 mins $Therapeutic Exercise: 8-22 mins  [place], [place]   Betsy Pries 04/11/2017, 1:18 PM

## 2017-04-11 NOTE — Progress Notes (Signed)
Subjective:  No acute events overnight.  Patient thinking much more clearly today.  He states he remembers Dr. Sharol Given and going to the OR for amputation but beyond that can't remember.  He is able to tell me the differences and similarities between an apple and orange and was oriented x3.    Objective:  Vital signs in last 24 hours: Vitals:   04/11/17 0540 04/11/17 0841 04/11/17 1236 04/11/17 1242  BP:  (!) 155/90 (!) 149/123 132/90  Pulse: 90 96 (!) 102   Resp: (!) 0 14 16   Temp:  98.5 F (36.9 C) 98.5 F (36.9 C)   TempSrc:  Oral Oral   SpO2: 98% 100% 100%   Weight:      Height:       Physical Exam  Constitutional:  Elderly, obese male sitting up in chair in no acute distress.  HENT:  Head: Normocephalic and atraumatic.  Eyes: EOM are normal. No scleral icterus.  Neck: Normal range of motion. Neck supple.  Cardiovascular: Regular rhythm and normal heart sounds. Exam reveals no gallop and no friction rub.  No murmur heard. Pulmonary/Chest: Breath sounds normal. No respiratory distress. He has no wheezes. He has no rales.  Abdominal: Soft. Bowel sounds are normal. He exhibits no distension. There is no tenderness.  Musculoskeletal: He exhibits deformity (S/p R BKA). He exhibits no edema.  Neurological: He is alert.  Patient is alert and oriented x3 today.  Able to comprehend and much less delayed with answers.  Able to tell me the differences and similarities between an apple and an orange.   He is able to follow commands.  No cranial nerve deficits noted.  Normal strength, No focal weakness   CBC Latest Ref Rng & Units 04/10/2017 04/09/2017 04/08/2017  WBC 4.0 - 10.5 K/uL 11.8(H) 9.3 10.9(H)  Hemoglobin 13.0 - 17.0 g/dL 9.6(L) 9.5(L) 9.9(L)  Hematocrit 39.0 - 52.0 % 29.7(L) 29.5(L) 30.4(L)  Platelets 150 - 400 K/uL 551(H) 541(H) 545(H)     Assessment/Plan:  Principal Problem:   Other encephalopathy Active Problems:   Essential hypertension   Uncontrolled type 2  diabetes mellitus with hyperglycemia, with long-term current use of insulin (HCC)   CKD stage 3 due to type 2 diabetes mellitus (HCC)   Streptococcal bacteremia   Upper GI bleed   Encephalopathy   Acute encephalopathy of unclear etiology:Patient presented with a 2-day history of altered mentation including expressive aphasia, slow speech, and trouble swallowing and following commands.Neurology consulted, extensive workup negative, EEG shows intermittent moderate to severe generalized slowing consistent with global encephalopathy ddx toxic, infectious, metabolic, increased ICP, most likely cephalosporin toxicity, switched pt to unasyn, pt improved dramatically the following day.    - passed swallow eval - Blood cultures recollected 3/30 no growth to date   - ammonia level normal - Pt refused ECHO and LP - Will attempt to speak to niece Dixon Boos [phone])   Normocytic anemia:Patient was noted to be anemic during previous admission that was thought to be secondary to acute blood loss after surgery for R BKA. While at CIR, his Hgb has continued to drop and required 2 units of blood.  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  CBC yesterday revealed  a mild leukocytosis of 10.9.  We will continue to monitor.  We will get blood cultures as below.  We will continue current antibiotic therapy with unasyn and consider broadening if he becomes febrile and clinically decompensates.  - Continue Unasyn until 4/6 - Follow up repeat blood cx  - Consider broadening antibiotic therapy if patient becomes febrile and clinically decompensates  HTN:  Continues to be hypertensive in the setting of holding antihypertensive medications due to  stroke workup.  CT and brain MRI without evidence of acute acute process.  Will resume home medications.  - Resume home losartan 25 mg QD - restarted home nifedipine 90mg  - Holding home HCTZ 12.5 mg QD    Uncontrolled T2DM: Hemoglobin A1c 12.5. His home regimen was levemir 60u BID,metformin 1000mg  BID, and glipizide 10mg  BID. He was discharged on 3/26 onmetformin 1000mg  BID, levemir 40u QHS, and Novolog 10u TID.   CBGs at goal on current regimen.  Will continue as below.  - Levemir 40U QHS  - SSI-M + 10 units with meals    CKD Stage III: Presented with AKI during previous admission with Cr 2.8, which improved to 1.17 on day of discharge. No history of CKD prior. Most recent BMP on 3/27 with Cr 1.25. Renal function currently stable.   -continue to monitor  Dispo: Anticipate return to [facility] tomorrow   Katherine Roan, MD 04/11/2017, 1:27 PM Vickki Muff MD PGY-1 Internal Medicine Pager # [phone]

## 2017-04-11 NOTE — Progress Notes (Signed)
Patient is greatly improved.  He refused LP yesterday.  He is awake, alert, oriented to person place and time, able to have normal conversation, though there is some occasional latency of response.  I suspect that this is related to cephalosporin toxicity, he had his dose decreased 2 days ago, then stopped yesterday and I feel that the improvement is in keeping with this.  At this time, no further recommendations, please call with further questions or concerns.  Roland Rack, MD [facility] [phone]  If 7pm- 7am, please page neurology on call as listed in [place].

## 2017-04-11 NOTE — Progress Notes (Signed)
  Speech Language Pathology Treatment: Cognitive-Linquistic  Patient Details Name: REGAN LLORENTE MRN: [id number] DOB: [birth year] Today's Date: 04/11/2017 Time: 8588-5027 SLP Time Calculation (min) (ACUTE ONLY): 19 min  Assessment / Plan / Recommendation Clinical Impression  Treatment focused primarily on pt's communication. Pt becoming much more lucid compared to previous notes. He reports "not remembering the past 3 days." The severity of pt's receptive and expressive language deficits has improved and now exhibits difficulty with higher level/abstract comprehension/concepts and semantics from more of a cognitive standpoint with his encephalopathy. His affect is flat, intonation monotone and suspect he is not at baseline from a pragmatic standpoint. Awareness improving and states "I am slower" in regards to his cognition. Followed 3 step command independently and required assist with higher level/abstract 2 step command. He would continue to benefit from ST for cognition/communication and for dysphagia treat. Goals to be updated.   HPI HPI: 62 year old male admitted 04/08/17. PMH significant for DM, HTN, legally blind, CKD3. MRI normal      SLP Plan  Continue with current plan of care       Recommendations                   Oral Care Recommendations: Oral care BID Follow up Recommendations: Inpatient Rehab SLP Visit Diagnosis: Cognitive communication deficit (X41.287) Plan: Continue with current plan of care       GO                Houston Siren 04/11/2017, 1:37 PM  Orbie Pyo Colvin Caroli.[employer] [phone]

## 2017-04-11 NOTE — Progress Notes (Signed)
Inpatient Rehabilitation  Patient is known to me from recent IP Rehab admission.  Received notification that the acute medical team has adjusted IV antibiotics.  Suspect altered mental status due to cephalosporin toxicity and switched to unasyn, with notes improvements.  However, patient needs to demonstrate ability to participate in and tolerate 3 hours of therapy.  Plan to follow up with patient and team tomorrow regarding readiness and tolerance.  Call if questions.   Carmelia Roller., CCC/SLP Admission Coordinator  [facility]  Cell [phone]

## 2017-04-12 ENCOUNTER — Inpatient Hospital Stay (HOSPITAL_COMMUNITY)
Admission: RE | Admit: 2017-04-12 | Discharge: 2017-04-28 | DRG: 559 | Disposition: A | Discharge status: Home Health | Payer: Medicare Other | Source: Intra-hospital | Attending: Physical Medicine & Rehabilitation | Admitting: Physical Medicine & Rehabilitation

## 2017-04-12 ENCOUNTER — Encounter (HOSPITAL_COMMUNITY): Payer: Self-pay | Admitting: *Deleted

## 2017-04-12 ENCOUNTER — Other Ambulatory Visit: Payer: Self-pay

## 2017-04-12 DIAGNOSIS — R143 Flatulence: Secondary | ICD-10-CM

## 2017-04-12 DIAGNOSIS — H548 Legal blindness, as defined in USA: Secondary | ICD-10-CM | POA: Diagnosis present

## 2017-04-12 DIAGNOSIS — N183 Chronic kidney disease, stage 3 (moderate): Secondary | ICD-10-CM | POA: Diagnosis present

## 2017-04-12 DIAGNOSIS — E669 Obesity, unspecified: Secondary | ICD-10-CM | POA: Diagnosis present

## 2017-04-12 DIAGNOSIS — K921 Melena: Secondary | ICD-10-CM | POA: Diagnosis present

## 2017-04-12 DIAGNOSIS — E46 Unspecified protein-calorie malnutrition: Secondary | ICD-10-CM | POA: Diagnosis present

## 2017-04-12 DIAGNOSIS — E1165 Type 2 diabetes mellitus with hyperglycemia: Secondary | ICD-10-CM

## 2017-04-12 DIAGNOSIS — S88111S Complete traumatic amputation at level between knee and ankle, right lower leg, sequela: Secondary | ICD-10-CM

## 2017-04-12 DIAGNOSIS — F4320 Adjustment disorder, unspecified: Secondary | ICD-10-CM | POA: Diagnosis present

## 2017-04-12 DIAGNOSIS — E1122 Type 2 diabetes mellitus with diabetic chronic kidney disease: Secondary | ICD-10-CM | POA: Diagnosis present

## 2017-04-12 DIAGNOSIS — R7881 Bacteremia: Secondary | ICD-10-CM | POA: Diagnosis present

## 2017-04-12 DIAGNOSIS — Z888 Allergy status to other drugs, medicaments and biological substances status: Secondary | ICD-10-CM

## 2017-04-12 DIAGNOSIS — I129 Hypertensive chronic kidney disease with stage 1 through stage 4 chronic kidney disease, or unspecified chronic kidney disease: Secondary | ICD-10-CM | POA: Diagnosis present

## 2017-04-12 DIAGNOSIS — Z794 Long term (current) use of insulin: Secondary | ICD-10-CM | POA: Diagnosis not present

## 2017-04-12 DIAGNOSIS — D62 Acute posthemorrhagic anemia: Secondary | ICD-10-CM | POA: Diagnosis present

## 2017-04-12 DIAGNOSIS — Z947 Corneal transplant status: Secondary | ICD-10-CM

## 2017-04-12 DIAGNOSIS — G4730 Sleep apnea, unspecified: Secondary | ICD-10-CM | POA: Diagnosis present

## 2017-04-12 DIAGNOSIS — E1142 Type 2 diabetes mellitus with diabetic polyneuropathy: Secondary | ICD-10-CM | POA: Diagnosis present

## 2017-04-12 DIAGNOSIS — K922 Gastrointestinal hemorrhage, unspecified: Secondary | ICD-10-CM

## 2017-04-12 DIAGNOSIS — Y92230 Patient room in hospital as the place of occurrence of the external cause: Secondary | ICD-10-CM | POA: Diagnosis present

## 2017-04-12 DIAGNOSIS — M19071 Primary osteoarthritis, right ankle and foot: Secondary | ICD-10-CM | POA: Diagnosis not present

## 2017-04-12 DIAGNOSIS — B955 Unspecified streptococcus as the cause of diseases classified elsewhere: Secondary | ICD-10-CM | POA: Diagnosis present

## 2017-04-12 DIAGNOSIS — E11649 Type 2 diabetes mellitus with hypoglycemia without coma: Secondary | ICD-10-CM | POA: Diagnosis not present

## 2017-04-12 DIAGNOSIS — G8918 Other acute postprocedural pain: Secondary | ICD-10-CM | POA: Diagnosis not present

## 2017-04-12 DIAGNOSIS — Z4781 Encounter for orthopedic aftercare following surgical amputation: Principal | ICD-10-CM

## 2017-04-12 DIAGNOSIS — T361X5A Adverse effect of cephalosporins and other beta-lactam antibiotics, initial encounter: Secondary | ICD-10-CM | POA: Diagnosis present

## 2017-04-12 DIAGNOSIS — R011 Cardiac murmur, unspecified: Secondary | ICD-10-CM | POA: Diagnosis present

## 2017-04-12 DIAGNOSIS — Z79891 Long term (current) use of opiate analgesic: Secondary | ICD-10-CM

## 2017-04-12 DIAGNOSIS — I10 Essential (primary) hypertension: Secondary | ICD-10-CM

## 2017-04-12 DIAGNOSIS — Z6834 Body mass index (BMI) 34.0-34.9, adult: Secondary | ICD-10-CM | POA: Diagnosis not present

## 2017-04-12 DIAGNOSIS — K5900 Constipation, unspecified: Secondary | ICD-10-CM | POA: Diagnosis present

## 2017-04-12 DIAGNOSIS — R509 Fever, unspecified: Secondary | ICD-10-CM | POA: Diagnosis not present

## 2017-04-12 DIAGNOSIS — E876 Hypokalemia: Secondary | ICD-10-CM | POA: Diagnosis not present

## 2017-04-12 DIAGNOSIS — Z79899 Other long term (current) drug therapy: Secondary | ICD-10-CM | POA: Diagnosis not present

## 2017-04-12 DIAGNOSIS — Z1339 Encounter for screening examination for other mental health and behavioral disorders: Secondary | ICD-10-CM | POA: Diagnosis not present

## 2017-04-12 DIAGNOSIS — E785 Hyperlipidemia, unspecified: Secondary | ICD-10-CM | POA: Diagnosis present

## 2017-04-12 DIAGNOSIS — R197 Diarrhea, unspecified: Secondary | ICD-10-CM | POA: Diagnosis not present

## 2017-04-12 DIAGNOSIS — E1169 Type 2 diabetes mellitus with other specified complication: Secondary | ICD-10-CM | POA: Diagnosis present

## 2017-04-12 DIAGNOSIS — B954 Other streptococcus as the cause of diseases classified elsewhere: Secondary | ICD-10-CM | POA: Diagnosis present

## 2017-04-12 DIAGNOSIS — F329 Major depressive disorder, single episode, unspecified: Secondary | ICD-10-CM | POA: Diagnosis present

## 2017-04-12 DIAGNOSIS — Z89511 Acquired absence of right leg below knee: Secondary | ICD-10-CM

## 2017-04-12 DIAGNOSIS — G92 Toxic encephalopathy: Secondary | ICD-10-CM | POA: Diagnosis present

## 2017-04-12 LAB — CBC
HEMATOCRIT: 26.5 % — AB (ref 39.0–52.0)
HEMATOCRIT: 28.1 % — AB (ref 39.0–52.0)
HEMATOCRIT: 27.9 % — AB (ref 39.0–52.0)
HEMOGLOBIN: 8.4 g/dL — AB (ref 13.0–17.0)
HEMOGLOBIN: 8.7 g/dL — AB (ref 13.0–17.0)
Hemoglobin: 8.7 g/dL — ABNORMAL LOW (ref 13.0–17.0)
MCH: 26.9 pg (ref 26.0–34.0)
MCH: 26.5 pg (ref 26.0–34.0)
MCH: 26.8 pg (ref 26.0–34.0)
MCHC: 31.7 g/dL (ref 30.0–36.0)
MCHC: 31 g/dL (ref 30.0–36.0)
MCHC: 31.2 g/dL (ref 30.0–36.0)
MCV: 84.9 fL (ref 78.0–100.0)
MCV: 85.7 fL (ref 78.0–100.0)
MCV: 85.8 fL (ref 78.0–100.0)
Platelets: 461 10*3/uL — ABNORMAL HIGH (ref 150–400)
Platelets: 479 10*3/uL — ABNORMAL HIGH (ref 150–400)
Platelets: 471 10*3/uL — ABNORMAL HIGH (ref 150–400)
RBC: 3.12 MIL/uL — ABNORMAL LOW (ref 4.22–5.81)
RBC: 3.28 MIL/uL — ABNORMAL LOW (ref 4.22–5.81)
RBC: 3.25 MIL/uL — ABNORMAL LOW (ref 4.22–5.81)
RDW: 16.9 % — AB (ref 11.5–15.5)
RDW: 17 % — AB (ref 11.5–15.5)
RDW: 17 % — AB (ref 11.5–15.5)
WBC: 7.4 10*3/uL (ref 4.0–10.5)
WBC: 8 10*3/uL (ref 4.0–10.5)
WBC: 8.4 10*3/uL (ref 4.0–10.5)

## 2017-04-12 LAB — GLUCOSE, CAPILLARY
GLUCOSE-CAPILLARY: 230 mg/dL — AB (ref 65–99)
Glucose-Capillary: 121 mg/dL — ABNORMAL HIGH (ref 65–99)
Glucose-Capillary: 217 mg/dL — ABNORMAL HIGH (ref 65–99)

## 2017-04-12 LAB — MAGNESIUM: Magnesium: 1.6 mg/dL — ABNORMAL LOW (ref 1.7–2.4)

## 2017-04-12 LAB — BASIC METABOLIC PANEL
Anion gap: 8 (ref 5–15)
BUN: 11 mg/dL (ref 6–20)
CALCIUM: 8.3 mg/dL — AB (ref 8.9–10.3)
CHLORIDE: 105 mmol/L (ref 101–111)
CO2: 27 mmol/L (ref 22–32)
CREATININE: 1.09 mg/dL (ref 0.61–1.24)
GFR calc Af Amer: >60 mL/min (ref >60)
GFR calc non Af Amer: >60 mL/min (ref >60)
GLUCOSE: 106 mg/dL — AB (ref 65–99)
Potassium: 3 mmol/L — ABNORMAL LOW (ref 3.5–5.1)
Sodium: 140 mmol/L (ref 135–145)

## 2017-04-12 LAB — VITAMIN B1: VITAMIN B1 (THIAMINE): 322.1 nmol/L — AB (ref 66.5–200.0)

## 2017-04-12 LAB — CREATININE, SERUM
Creatinine, Ser: 1.32 mg/dL — ABNORMAL HIGH (ref 0.61–1.24)
GFR calc non Af Amer: 57 mL/min — ABNORMAL LOW (ref >60)

## 2017-04-12 MED ORDER — INSULIN DETEMIR 100 UNIT/ML ~~LOC~~ SOLN
40.0000 [IU] | Freq: Every day | SUBCUTANEOUS | Status: DC
  Administered 2017-04-12 – 2017-04-22 (×11): 40 [IU] via SUBCUTANEOUS
  Filled 2017-04-12 (×11): qty 0.4

## 2017-04-12 MED ORDER — BARRIER CREAM NON-SPECIFIED
1.0000 "application " | TOPICAL_CREAM | Freq: Two times a day (BID) | TOPICAL | Status: DC | PRN
  Filled 2017-04-12: qty 1

## 2017-04-12 MED ORDER — INSULIN ASPART 100 UNIT/ML ~~LOC~~ SOLN: 0.0000 [IU] | Freq: Three times a day (TID) | SUBCUTANEOUS | 11 refills | Status: DC

## 2017-04-12 MED ORDER — BARRIER CREAM NON-SPECIFIED: 1.0000 "application " | TOPICAL_CREAM | Freq: Two times a day (BID) | TOPICAL | Status: DC | PRN

## 2017-04-12 MED ORDER — SUCRALFATE 1 G PO TABS
1.0000 g | ORAL_TABLET | Freq: Once | ORAL | Status: AC
  Administered 2017-04-12: 1 g via ORAL
  Filled 2017-04-12: qty 1

## 2017-04-12 MED ORDER — ENOXAPARIN SODIUM 40 MG/0.4ML ~~LOC~~ SOLN: 40.0000 mg | SUBCUTANEOUS | Status: DC

## 2017-04-12 MED ORDER — TRAMADOL HCL 50 MG PO TABS: 50.0000 mg | ORAL_TABLET | Freq: Three times a day (TID) | ORAL | Status: DC | PRN

## 2017-04-12 MED ORDER — TRAMADOL HCL 50 MG PO TABS
50.0000 mg | ORAL_TABLET | Freq: Three times a day (TID) | ORAL | Status: DC | PRN
  Administered 2017-04-12: 50 mg via ORAL
  Filled 2017-04-12: qty 1

## 2017-04-12 MED ORDER — SORBITOL 70 % SOLN: 30.0000 mL | Freq: Every day | Status: DC | PRN

## 2017-04-12 MED ORDER — LOSARTAN POTASSIUM 50 MG PO TABS
100.0000 mg | ORAL_TABLET | Freq: Every day | ORAL | Status: DC
  Administered 2017-04-13 – 2017-04-28 (×16): 100 mg via ORAL
  Filled 2017-04-12 (×17): qty 2

## 2017-04-12 MED ORDER — SODIUM CHLORIDE 0.9 % IV SOLN
3.0000 g | Freq: Four times a day (QID) | INTRAVENOUS | Status: DC
  Administered 2017-04-12 – 2017-04-20 (×30): 3 g via INTRAVENOUS
  Filled 2017-04-12 (×33): qty 3

## 2017-04-12 MED ORDER — ACETAMINOPHEN 650 MG RE SUPP: 650.0000 mg | Freq: Four times a day (QID) | RECTAL | 0 refills | Status: DC | PRN

## 2017-04-12 MED ORDER — SUCRALFATE 1 G PO TABS: 1.0000 g | ORAL_TABLET | Freq: Every day | ORAL | Status: DC

## 2017-04-12 MED ORDER — ONDANSETRON HCL 4 MG/2ML IJ SOLN: 4.0000 mg | Freq: Four times a day (QID) | INTRAMUSCULAR | Status: DC | PRN

## 2017-04-12 MED ORDER — NIFEDIPINE ER OSMOTIC RELEASE 90 MG PO TB24
90.0000 mg | ORAL_TABLET | Freq: Every day | ORAL | Status: DC
  Administered 2017-04-13 – 2017-04-28 (×16): 90 mg via ORAL
  Filled 2017-04-12 (×16): qty 1

## 2017-04-12 MED ORDER — POTASSIUM CHLORIDE CRYS ER 20 MEQ PO TBCR
40.0000 meq | EXTENDED_RELEASE_TABLET | Freq: Two times a day (BID) | ORAL | Status: AC
  Administered 2017-04-12 (×2): 40 meq via ORAL
  Filled 2017-04-12 (×2): qty 2

## 2017-04-12 MED ORDER — PANTOPRAZOLE SODIUM 40 MG PO TBEC: 40.0000 mg | DELAYED_RELEASE_TABLET | Freq: Two times a day (BID) | ORAL | Status: DC

## 2017-04-12 MED ORDER — INSULIN ASPART 100 UNIT/ML ~~LOC~~ SOLN
10.0000 [IU] | Freq: Three times a day (TID) | SUBCUTANEOUS | Status: DC
  Administered 2017-04-13 – 2017-04-17 (×12): 10 [IU] via SUBCUTANEOUS
  Administered 2017-04-18: 5 [IU] via SUBCUTANEOUS
  Administered 2017-04-18 – 2017-04-23 (×13): 10 [IU] via SUBCUTANEOUS

## 2017-04-12 MED ORDER — ENOXAPARIN SODIUM 40 MG/0.4ML ~~LOC~~ SOLN
40.0000 mg | SUBCUTANEOUS | Status: DC
  Administered 2017-04-12 – 2017-04-27 (×16): 40 mg via SUBCUTANEOUS
  Filled 2017-04-12 (×16): qty 0.4

## 2017-04-12 MED ORDER — MAGNESIUM SULFATE 2 GM/50ML IV SOLN
2.0000 g | Freq: Once | INTRAVENOUS | Status: DC
  Administered 2017-04-12: 2 g via INTRAVENOUS
  Filled 2017-04-12: qty 50

## 2017-04-12 MED ORDER — ACETAMINOPHEN 650 MG RE SUPP: 650.0000 mg | Freq: Four times a day (QID) | RECTAL | Status: DC | PRN

## 2017-04-12 MED ORDER — SODIUM CHLORIDE 0.9% FLUSH: 10.0000 mL | Freq: Two times a day (BID) | INTRAVENOUS | Status: DC

## 2017-04-12 MED ORDER — ACETAMINOPHEN 325 MG PO TABS
650.0000 mg | ORAL_TABLET | Freq: Four times a day (QID) | ORAL | Status: DC | PRN
  Administered 2017-04-12 – 2017-04-26 (×9): 650 mg via ORAL
  Filled 2017-04-12 (×10): qty 2

## 2017-04-12 MED ORDER — ONDANSETRON HCL 4 MG PO TABS: 4.0000 mg | ORAL_TABLET | Freq: Four times a day (QID) | ORAL | Status: DC | PRN

## 2017-04-12 MED ORDER — PANTOPRAZOLE SODIUM 40 MG PO TBEC
40.0000 mg | DELAYED_RELEASE_TABLET | Freq: Two times a day (BID) | ORAL | Status: DC
  Administered 2017-04-12 – 2017-04-28 (×32): 40 mg via ORAL
  Filled 2017-04-12 (×32): qty 1

## 2017-04-12 MED ORDER — SODIUM CHLORIDE 0.9 % IV SOLN: 3.0000 g | Freq: Four times a day (QID) | INTRAVENOUS | Status: DC

## 2017-04-12 MED ORDER — MAGNESIUM SULFATE 2 GM/50ML IV SOLN: 2.0000 g | Freq: Once | INTRAVENOUS | 0 refills | Status: DC

## 2017-04-12 MED ORDER — PANTOPRAZOLE SODIUM 40 MG PO TBEC
40.0000 mg | DELAYED_RELEASE_TABLET | Freq: Two times a day (BID) | ORAL | Status: DC
  Administered 2017-04-12: 40 mg via ORAL
  Filled 2017-04-12: qty 1

## 2017-04-12 MED ORDER — ACETAMINOPHEN 325 MG PO TABS: 650.0000 mg | ORAL_TABLET | Freq: Four times a day (QID) | ORAL | Status: AC | PRN

## 2017-04-12 MED ORDER — POTASSIUM CHLORIDE 20 MEQ PO PACK: 40.0000 meq | PACK | Freq: Two times a day (BID) | ORAL | Status: DC

## 2017-04-12 MED ORDER — INSULIN ASPART 100 UNIT/ML ~~LOC~~ SOLN: 10.0000 [IU] | Freq: Three times a day (TID) | SUBCUTANEOUS | 11 refills | Status: DC

## 2017-04-12 MED ORDER — INSULIN ASPART 100 UNIT/ML ~~LOC~~ SOLN
0.0000 [IU] | Freq: Three times a day (TID) | SUBCUTANEOUS | Status: DC
  Administered 2017-04-13: 3 [IU] via SUBCUTANEOUS
  Administered 2017-04-14 (×2): 2 [IU] via SUBCUTANEOUS
  Administered 2017-04-16: 3 [IU] via SUBCUTANEOUS
  Administered 2017-04-16: 2 [IU] via SUBCUTANEOUS
  Administered 2017-04-17 – 2017-04-19 (×3): 3 [IU] via SUBCUTANEOUS
  Administered 2017-04-19: 2 [IU] via SUBCUTANEOUS
  Administered 2017-04-20: 3 [IU] via SUBCUTANEOUS
  Administered 2017-04-22: 8 [IU] via SUBCUTANEOUS
  Administered 2017-04-22: 5 [IU] via SUBCUTANEOUS
  Administered 2017-04-23 – 2017-04-24 (×3): 2 [IU] via SUBCUTANEOUS
  Administered 2017-04-24 – 2017-04-28 (×6): 3 [IU] via SUBCUTANEOUS

## 2017-04-12 MED ORDER — POTASSIUM CHLORIDE CRYS ER 20 MEQ PO TBCR: 40.0000 meq | EXTENDED_RELEASE_TABLET | Freq: Two times a day (BID) | ORAL | Status: DC

## 2017-04-12 NOTE — PMR Pre-admission (Signed)
PMR Admission Coordinator Pre-Admission Assessment  Patient: Trevor [name] is an 62 y.o., male MRN: [id number] DOB: 04/25/1955 Height: 6\' 4"  (193 cm) Weight: (!) 138.3 kg (304 lb 14.3 oz)              Insurance Information HMO:     PPO:      PCP:      IPA:      80/20:      OTHER:  PRIMARY: Uninsured/Self-pay      Policy#:       Subscriber:  CM Name:       Phone#:      Fax#:  Pre-Cert#:       Employer:  Benefits:  Phone #:      Name:  Eff. Date:      Deduct:       Out of Pocket Max:       Life Max:  CIR:       SNF:  Outpatient:      Co-Pay:  Home Health:       Co-Pay:  DME:      Co-Pay:  Providers:   Medicaid Application Date:       Case Manager:  Disability Application Date:       Case Worker:   Emergency Contact Information Contact Information    Name Relation Home Work [place], [place] Niece   [phone]   Murcock,Peggy Aunt [phone]       Current Medical History  Patient Admitting Diagnosis: Right BKA   History of Present Illness: Trevor Capri Murdockis a 62 y.o.right-handed malewith history of diabetes mellitus, hypertension,legally blind, CKD stage III.Per chartreview and patient, patientlives in [place]. 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 Sharol Given. Hospital course pain managementand transition to oral Dilaudid as needed.Wound VAC appliedfor 1 week postoperatively.Plan stump shrinker per [employer] prosthetics.Blood cultures +1 of 2 for strep VIRIDANSbacteremia and advised IV ceftezole and through 04/15/2017.Acute blood loss anemia 9.7 and monitored.SQ lovenox forDVT prophylaxis.Creatinine stable with history of CKD stage III  latest creatinine 1.3from baseline 2.06Physical therapy evaluation completed with recommendations of physical medicine rehab consult.Patient was admitted for a comprehensive rehab program 04/04/2017.  Initially needing some encouragement to participate.    During his rehabilitation course patient with noted anemia, melena with gastroenterology consulted and advised supportive care with PPI.  He was transfused 2 units of packed red blood cells.  On 04/08/2017 patient with altered mental status and more somnolent.  Discharged to acute care services 04/08/2017 for ongoing workup. MRI of the brain reviewed, unremarkable for acute intracranial process. CT Angio of the chest showed no pulmonary emboli.  EEG negative.  Carotid Dopplers with no ICA stenosis. Per neurology follow-up workup it was felt that altered mental status was related to his cephalosporin toxicity and Ancef was changed to Unasyn.  Latest blood cultures were collected 04/08/2017 no growth to date.  Mental status has continued to improve.  Physical and occupational therapy has resumed and patient was re-admitted for comprehensive rehab program 04/12/17.      Past Medical History  Past Medical History:  Diagnosis Date  . Diabetes mellitus without complication ([place])   . Heart murmur   . Hyperlipidemia   . Hypertension   .  Sleep apnea     Family History  family history is not on file.  Prior Rehab/Hospitalizations:  Has the patient had major surgery during 100 days prior to admission? No  Current Medications   Current Facility-Administered Medications:  .  acetaminophen (TYLENOL) tablet 650 mg, 650 mg, Oral, Q6H PRN, 650 mg at 04/11/17 0249 **OR** acetaminophen (TYLENOL) suppository 650 mg, 650 mg, Rectal, Q6H PRN, Jari Favre, Gorica, MD .  Ampicillin-Sulbactam (UNASYN) 3 g in sodium chloride 0.9 % 100 mL IVPB, 3 g, Intravenous, Q6H, Hoffman, Jessica Ratliff, DO, Last Rate: 200 mL/hr at 04/12/17 1322, 3 g at 04/12/17 1322 .  barrier  cream (non-specified) 1 application, 1 application, Topical, BID PRN, Katherine Roan, MD .  enoxaparin (LOVENOX) injection 40 mg, 40 mg, Subcutaneous, Q24H, Svalina, Gorica, MD, 40 mg at 04/11/17 2034 .  insulin aspart (novoLOG) injection 0-15 Units, 0-15 Units, Subcutaneous, TID WC, Santos-Sanchez, Idalys, MD, 5 Units at 04/12/17 1323 .  insulin aspart (novoLOG) injection 10 Units, 10 Units, Subcutaneous, TID WC, Santos-Sanchez, Idalys, MD, 10 Units at 04/12/17 1325 .  insulin detemir (LEVEMIR) injection 40 Units, 40 Units, Subcutaneous, QHS, Alphonzo Grieve, MD, 40 Units at 04/11/17 2109 .  losartan (COZAAR) tablet 100 mg, 100 mg, Oral, Daily, Santos-Sanchez, Idalys, MD, 100 mg at 04/12/17 0910 .  NIFEdipine (PROCARDIA XL/ADALAT-CC) 24 hr tablet 90 mg, 90 mg, Oral, Daily, Katherine Roan, MD, 90 mg at 04/12/17 0910 .  pantoprazole (PROTONIX) EC tablet 40 mg, 40 mg, Oral, BID, Katherine Roan, MD, 40 mg at 04/12/17 1321 .  potassium chloride SA (K-DUR,KLOR-CON) CR tablet 40 mEq, 40 mEq, Oral, BID, Katherine Roan, MD, 40 mEq at 04/12/17 0908 .  sodium chloride flush (NS) 0.9 % injection 10-40 mL, 10-40 mL, Intracatheter, Q12H, Hoffman, Erik C, DO, 10 mL at 04/12/17 0911 .  [START ON 04/13/2017] sucralfate (CARAFATE) tablet 1 g, 1 g, Oral, Daily, Winfrey, Jenne Pane, MD  Patients Current Diet: Fall precautions Diet heart healthy/carb modified Room service appropriate? 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  He has a supportive niece and nephew in the area.    Home Assistive Devices / Equipment Home Assistive  Devices/Equipment: None Home Equipment: None  Prior Device Use: Indicate devices/aids used by the patient prior to current illness, exacerbation or injury? None  Prior Functional Level Prior Function Level of Independence: Independent Comments: Independent prior to BKA and dc to CIR.   Self Care: Did the patient need help bathing, dressing, using the toilet or eating? Independent  Indoor Mobility: Did the patient need assistance with walking from room to room (with or without device)? Independent  Stairs: Did the patient need assistance with internal or external stairs (with or without device)? Independent  Functional Cognition: Did the patient need help planning regular tasks such as shopping or remembering to take medications? Independent  Current Functional Level Cognition  Arousal/Alertness: Awake/alert Overall Cognitive Status: Impaired/Different from baseline Difficult to assess due to: Impaired communication Current Attention Level: Alternating Orientation Level: Oriented to person, Oriented to place, Disoriented to time, Disoriented to situation Following Commands: Follows multi-step commands with increased time General Comments: Perseverating on multiple topics  including "echo location" "lose your license" "please remember you are part of the miracle".  Inconsistantly answering simple questions. Easily distracted. Pt becoming irritated and agitated when therapist would silence IV or move foley bag. Squeezing therapist's hands aggressviely at times during irritation.     Extremity Assessment (includes Sensation/Coordination)  Upper Extremity Assessment: Overall WFL for tasks assessed  Lower Extremity Assessment: Defer to PT evaluation RLE Deficits / Details: s/p BKA with wound vac in place LLE Deficits / Details: Able to lift LLE off bed against gravity without difficulty.     ADLs  Overall ADL's : Needs assistance/impaired Eating/Feeding: Minimal assistance, Bed  level Eating/Feeding Details (indicate cue type and reason): Pt stating "I hate the food" when moving his table to bedside.  Grooming: Wash/dry face, Sitting, Supervision/safety Grooming Details (indicate cue type and reason): Pt washing his face at bed level demosntrating WFL for BUEs. Pt able to follow simple cue to "take wash clothe" and "wash your face" Upper Body Bathing: Moderate assistance, Bed level Lower Body Bathing: Maximal assistance, Bed level Upper Body Dressing : Moderate assistance, Bed level Lower Body Dressing: Maximal assistance, Bed level Lower Body Dressing Details (indicate cue type and reason): Pt able to elevate LLE for donning sock with Max A. General ADL Comments: encouraged pt to sit EOB.  Pt initially agreed then declined.  Re Explained role of OT ann importance of participation. Pt agreed to BUE Exercise.  Pt provided with blue theraband and instructed in use of shoulder flexion. Pt was agreable 1 st 2 exercises then became agitated when RN came in the room and he didnt invite him in.  OT session ended at this point    Mobility  Overal bed mobility: Needs Assistance Bed Mobility: Supine to Sit Supine to sit: Supervision Sit to supine: Min guard, HOB elevated General bed mobility comments: supervision for safety, required bedrail assist to pull to upright    Transfers  Overall transfer level: Needs assistance Equipment used: None Transfers: Lateral/Scoot Transfers  Lateral/Scoot Transfers: Min assist General transfer comment: min A for 3 "bump" lateral scoot transfer to recliner    Ambulation / Gait / Stairs / Wheelchair Mobility  Ambulation/Gait General Gait Details: unable    Posture / Balance Balance Overall balance assessment: Needs assistance Sitting-balance support: Feet supported, No upper extremity supported Sitting balance-Leahy Scale: Fair    Special needs/care consideration BiPAP/CPAP: No CPM: No Continuous Drip IV: No Dialysis: No          Life Vest: No Oxygen: No Special Bed: No Trach Size: No Wound Vac (area): Yes      Location: Right BKA stump Skin: Dry monitoring left foot, may require podiatry follow up as an outpatient for management of toe nails                               Bowel mgmt: Intermittent incontinence, last BM 04/11/17 Bladder mgmt: Intermittent incontinence  Diabetic mgmt: Yes PTA with occasional CBG checks, oral medication and Levamir; HgbA1c12.5     Previous Home Environment Living Arrangements: Other relatives  Lives With: Family Available Help at Discharge: Family, Available PRN/intermittently Type of Home: House Home Layout: One level Home Access: Ramped entrance Bathroom Shower/Tub: Tub/shower unit, Architectural technologist: Standard  Discharge Living Setting Plans for Discharge Living Setting: Patient's home, House Type of Home at Discharge: House Discharge Home Layout: One level Discharge Home Access: [place] entrance Discharge Bathroom Shower/Tub: [place] unit, Curtain  Discharge Bathroom Toilet: Standard Discharge Bathroom Accessibility: No Does the patient have any problems obtaining your medications?: Yes (Describe)(uninsured with application pending; has an attroney as well)  Social/Family/Support Systems Patient Roles: Other (Comment)(uncle) Contact Information: Niece, Dixon Boos [phone] Anticipated Caregiver: Niece and fellow church memebers as needed  Anticipated Caregiver's Contact Information: see above Ability/Limitations of Caregiver: Niece has 3 children and works part time  Careers adviser: Intermittent Discharge Plan Discussed with Primary Caregiver: Yes Is Caregiver In Agreement with Plan?: Yes Does Caregiver/Family have Issues with Lodging/Transportation while Pt is in Rehab?: No  Goals/Additional Needs Patient/Family Goal for Rehab: PT/OT: Mod I  Expected length of stay: 5-7 days  Cultural Considerations: Christian with observance on Saturday  with no therapy  Dietary Needs: Carb. Mod. diet restrictions  Equipment Needs: TBD Additional Information: Wound vac due to be removed Pt/Family Agrees to Admission and willing to participate: Yes Program Orientation Provided & Reviewed with Pt/Caregiver Including Roles  & Responsibilities: Yes Additional Information Needs: Pt. with attorney for SSI/SSD  Information Needs to be Provided By: FYI for CSW and niece has been handeling   Barriers to Discharge: Home environment access/layout, Medication compliance, Decreased caregiver support  Decrease burden of Care through IP rehab admission: No  Possible need for SNF placement upon discharge: No  Patient Condition: This patient's medical and functional status has changed since the consult dated: 04/04/17 in which the Rehabilitation Physician determined and documented that the patient's condition is appropriate for intensive rehabilitative care in an inpatient rehabilitation facility. See "History of Present Illness" (above) for medical update. Functional changes are: Min A scoot transfer. Patient's medical and functional status update has been discussed with the Rehabilitation physician and patient remains appropriate for inpatient rehabilitation. Will admit to inpatient rehab today.  Preadmission Screen Completed By:  Gunnar Fusi, 04/12/2017 4:33 PM ______________________________________________________________________   Discussed status with Dr. Posey Pronto on 04/12/17 at 1645 and received telephone approval for admission today.  Admission Coordinator:  Gunnar Fusi, time 1645/Date 04/12/17

## 2017-04-12 NOTE — Progress Notes (Signed)
Inpatient Rehabilitation  I have received acute medical clearance and plan to proceed with admission to IP Rehab today.  Call if questions.   Carmelia Roller., CCC/SLP Admission Coordinator  [facility]  Cell [phone]

## 2017-04-12 NOTE — Progress Notes (Signed)
Inpatient Rehabilitation  Met with patient at bedside today.  He is alert and able to express his needs with extra time. He asked appropriate questions about his care and is wanting to return to IP Rehab to complete his rehab course.  He is agreeable to getting up with therapy today in order to ready himself to return to IP Rehab.  I await medical clearance prior to potential admission today.    Melissa Bowie, M.A., CCC/SLP Admission Coordinator  [facility]  Cell [phone]  

## 2017-04-12 NOTE — Progress Notes (Signed)
Pt c/o of pain to surgical incision but no other prn except for tylenol; pt refused and wanted something different. MD paged notified. Will closely monitor. Delia Heady RN

## 2017-04-12 NOTE — Discharge Summary (Signed)
Name: Trevor [name] MRN: [id number] DOB: 07-13-1955 62 y.o. PCP: [name], NP  Date of Admission: 04/08/2017  8:18 PM Date of Discharge:  Attending Physician: Aldine Contes, MD  Discharge Diagnosis: 1.  Principal Problem:   Other encephalopathy Active Problems:   Essential hypertension   Uncontrolled type 2 diabetes mellitus with hyperglycemia, with long-term current use of insulin (HCC)   CKD stage 3 due to type 2 diabetes mellitus (HCC)   Streptococcal bacteremia   Upper GI bleed   Encephalopathy   Acute blood loss anemia   Discharge Medications: Allergies as of 04/12/2017      Reactions   Enalapril Cough      Medication List    STOP taking these medications   ceFAZolin 2-4 GM/100ML-% IVPB Commonly known as:  ANCEF   cyclobenzaprine 10 MG tablet Commonly known as:  FLEXERIL   glucose blood test strip Commonly known as:  TRUE METRIX BLOOD GLUCOSE TEST   hydrochlorothiazide 12.5 MG capsule Commonly known as:  MICROZIDE   metFORMIN 1000 MG tablet Commonly known as:  GLUCOPHAGE   oxyCODONE-acetaminophen 5-325 MG tablet Commonly known as:  PERCOCET/ROXICET     TAKE these medications   acetaminophen 325 MG tablet Commonly known as:  TYLENOL Take 2 tablets (650 mg total) by mouth every 6 (six) hours as needed for mild pain (or Fever >/= 101).   acetaminophen 650 MG suppository Commonly known as:  TYLENOL Place 1 suppository (650 mg total) rectally every 6 (six) hours as needed for mild pain (or Fever >/= 101).   Ampicillin-Sulbactam 3 g in sodium chloride 0.9 % 100 mL Inject 3 g into the vein every 6 (six) hours for 3 days.   barrier cream Crea Commonly known as:  non-specified Apply 1 application topically 2 (two) times daily as needed.   enoxaparin 40 MG/0.4ML injection Commonly known as:  LOVENOX Inject 0.4 mLs (40 mg total) into the skin daily.   fluticasone 50 MCG/ACT nasal spray Commonly known as:  FLONASE Place 2 sprays into  both nostrils daily.   guaiFENesin 600 MG 12 hr tablet Commonly known as:  MUCINEX Take 1 tablet (600 mg total) by mouth 2 (two) times daily as needed for cough or to loosen phlegm.   insulin aspart 100 UNIT/ML injection Commonly known as:  novoLOG Inject 0-15 Units into the skin 3 (three) times daily with meals. What changed:    how much to take  Another medication with the same name was removed. Continue taking this medication, and follow the directions you see here.   insulin aspart 100 UNIT/ML injection Commonly known as:  novoLOG Inject 10 Units into the skin 3 (three) times daily with meals. What changed:    Another medication with the same name was changed. Make sure you understand how and when to take each.  Another medication with the same name was removed. Continue taking this medication, and follow the directions you see here.   insulin detemir 100 UNIT/ML injection Commonly known as:  LEVEMIR Inject 0.4 mLs (40 Units total) into the skin at bedtime.   losartan 25 MG tablet Commonly known as:  COZAAR Take 100 mg by mouth daily.   magnesium sulfate 2 GM/50ML Soln infusion Inject 50 mLs (2 g total) into the vein once for 1 dose.   NIFEdipine 90 MG 24 hr tablet Commonly known as:  PROCARDIA XL/ADALAT-CC Take 1 tablet (90 mg total) by mouth daily.   pantoprazole 40 MG tablet Commonly known as:  PROTONIX Take 1 tablet (40 mg total) by mouth 2 (two) times daily.   polyethylene glycol packet Commonly known as:  MIRALAX / GLYCOLAX Take 17 g by mouth daily as needed for mild constipation.   potassium chloride SA 20 MEQ tablet Commonly known as:  K-DUR,KLOR-CON Take 2 tablets (40 mEq total) by mouth 2 (two) times daily.   senna-docusate 8.6-50 MG tablet Commonly known as:  Senokot-S Take 1 tablet by mouth at bedtime.   sodium chloride flush 0.9 % Soln Commonly known as:  NS 10-40 mLs by Intracatheter route every 12 (twelve) hours.   sucralfate 1 g  tablet Commonly known as:  CARAFATE Take 1 tablet (1 g total) by mouth daily. Start taking on:  04/13/2017   traMADol 50 MG tablet Commonly known as:  ULTRAM Take 1 tablet (50 mg total) by mouth every 8 (eight) hours as needed for severe pain.       Disposition and follow-up:   Mr.Trevor [name] was discharged from [facility] in Stable condition.  At the hospital follow up visit please address:  Encephalopathy   -Due to cephalosporin toxicity.  Pt switched to unasyn  -complete course of IV abx on 04/15/17   Acute blood loss anemia 2/2 presumed upper GI bleed  -continue with daily CBC -continue protonix, carafate -reconsult GI for EGD if pt requires further transfusions  Streptococcal bacteremia  -Continue Unasyn until 04/15/17 -repeat blood cultures from 3/31 negative  2.  Labs / imaging needed at time of follow-up: CBC, BMP, magnesium  3.  Pending labs/ test needing follow-up: none  Follow-up Appointments:   Hospital Course by problem list: Principal Problem:   Other encephalopathy Active Problems:   Essential hypertension   Uncontrolled type 2 diabetes mellitus with hyperglycemia, with long-term current use of insulin (HCC)   CKD stage 3 due to type 2 diabetes mellitus (HCC)   Streptococcal bacteremia   Upper GI bleed   Encephalopathy   Acute blood loss anemia   Patient was admitted from [facility] inpatient rehab due to suffering from encephalopathy during the beginning portion of his rehab following his 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  Extensive workup for the patient's encephalopathy revealed no organic abnormalities.  He had a CT and MRI of his brain which showed no acute abnormalities.  His metabolic workup was negative for any derangements and those lab results are shown below.  He had an EEG that demonstrated diffuse slowing of brain activity with a differential of toxic metabolic versus infectious.  Neurology was  consulted, we also tried to do an lumbar puncture and echo but the patient refused.  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  The patient subsequently improved and returned to a baseline of mental function with only mild delay in thought process which improved daily.  At that time it was felt that the patient was now medically stable and would greatly benefit from finishing his stent in [facility] inpatient rehab.  [facility] inpatient rehab graciously saw the patient quickly and efficiently and we admitted him to their service for further physical therapy and rehabilitation.    Discharge Vitals:   BP 133/81 (BP Location: Left Arm)   Pulse (!) 103   Temp 98.1 F (36.7 C) (Oral)   Resp 16   Ht 6\' 4"  (1.93 m)   Wt (!) 304 lb 14.3 oz (138.3 kg)   SpO2 100%  BMI 37.11 kg/m   Pertinent Labs, Studies, and Procedures:   CBC Latest Ref Rng & Units 04/13/2017 04/13/2017 04/12/2017  WBC 4.0 - 10.5 K/uL 9.4 6.0 8.4  Hemoglobin 13.0 - 17.0 g/dL 8.5(L) 7.9(L) 8.7(L)  Hematocrit 39.0 - 52.0 % 26.9(L) 24.4(L) 27.9(L)  Platelets 150 - 400 K/uL 462(H) 397 471(H)   BMP Latest Ref Rng & Units 04/13/2017 04/12/2017 04/12/2017  Glucose 65 - 99 mg/dL 89 - 106(H)  BUN 6 - 20 mg/dL 12 - 11  Creatinine 0.61 - 1.24 mg/dL 1.13 1.32(H) 1.09  BUN/Creat Ratio 10 - 24 - - -  Sodium 135 - 145 mmol/L 140 - 140  Potassium 3.5 - 5.1 mmol/L 3.2(L) - 3.0(L)  Chloride 101 - 111 mmol/L 107 - 105  CO2 22 - 32 mmol/L 26 - 27  Calcium 8.9 - 10.3 mg/dL 8.1(L) - 8.3(L)    Ref Range & Units 4d ago  Vitamin B-12 180 - 914 pg/mL 1,119High         Ref Range & Units4d ago Vitamin B1 (Thiamine)66.5 - 200.0 nmol/L   322.1   High    Ref Range & Units4d ago Ammonia9 - 35 umol/L20 Comment: Performed at [facility], [place] [address] [place], [place] [address]   Component4d ago Specimen DescriptionBLOOD LEFT ANTECUBITAL Special RequestsBOTTLES DRAWN  AEROBIC AND ANAEROBIC Blood Culture adequate volume Culture  NO GROWTH 4 DAYS  Performed at [place] [address], [place],  [address]   Report StatusPENDING Resulting [facility]   CT angio    FINDINGS: Cardiovascular: Multifactorial degradation. Limitations include patient body habitus and arm position, not raised above the head. The quality of this exam for evaluation of pulmonary embolism is moderate. The bolus is well timed. Pulmonary artery enlargement, outflow tract 4.1 cm. No pulmonary embolism to the large segmental level.   Normal aortic caliber, without dissection. Mild cardiomegaly with left ventricular hypertrophy.   Mediastinum/Nodes: No mediastinal or hilar adenopathy.   Lungs/Pleura: No pleural fluid. patchy bibasilar airspace disease, most consistent with atelectasis.   Upper Abdomen: Artifact degradation continuing into the upper abdomen. Focal steatosis adjacent the falciform ligament. Grossly normal imaged spleen, stomach, pancreas, gallbladder, adrenal glands, kidneys.   Musculoskeletal: Moderate thoracic spondylosis.   Review of the MIP images confirms the above findings.   IMPRESSION: 1. Mild to moderate degradation, as detailed above. No pulmonary embolism with above limitations. 2. Bibasilar atelectasis. 3. Pulmonary artery enlargement suggests pulmonary arterial hypertension. 4. Mild cardiomegaly with left ventricular hypertrophy.   CT Head  EXAM: CT HEAD WITHOUT CONTRAST   TECHNIQUE: Contiguous axial images were obtained from the base of the skull through the vertex without intravenous contrast.   COMPARISON:  None.   FINDINGS: Brain: There is no evidence of acute infarct, intracranial hemorrhage, mass, midline shift, or extra-axial fluid collection. The ventricles and sulci are normal.   Vascular: No hyperdense vessel.   Skull: No fracture or focal osseous lesion.   Sinuses/Orbits: Visualized paranasal  sinuses and mastoid air cells are clear. Visualized orbits are unremarkable.   Other: None.   IMPRESSION: Negative head CT.     Electronically Signed   By: Logan Bores M.D.   On: 04/08/2017 19:18  MRI brain  CLINICAL DATA:  Dysphagia, altered speech and altered mental status. History of diabetes, recent osteomyelitis and RIGHT BKA, hypertension.   EXAM: MRI HEAD WITHOUT CONTRAST   TECHNIQUE: Multiplanar, multiecho pulse sequences of the brain and surrounding structures were obtained  without intravenous contrast.   COMPARISON:  CT HEAD April 08, 2017   FINDINGS: INTRACRANIAL CONTENTS: No reduced diffusion to suggest acute ischemia. No susceptibility artifact to suggest hemorrhage. The ventricles and sulci are normal for patient's age. A few punctate supratentorial white matter FLAIR T2 hyperintensities are nonspecific, normal for age. No suspicious parenchymal signal, masses, mass effect. No abnormal extra-axial fluid collections. No extra-axial masses.   VASCULAR: Normal major intracranial vascular flow voids present at skull base.   SKULL AND UPPER CERVICAL SPINE: No abnormal sellar expansion. No suspicious calvarial bone marrow signal. Craniocervical junction maintained.   SINUSES/ORBITS: Trace paranasal sinus mucosal thickening without air-fluid levels. Mastoid air cells are well aerated. Included ocular globes and orbital contents are non-suspicious.   OTHER: None.   IMPRESSION: Normal noncontrast MRI of the head for age.     Electronically Signed   By: Elon Alas M.D.   On: 04/09/2017 01:43  Carotid Doppler Ultrasound  Final Interpretation: Right Carotid: Velocities in the right ICA are consistent with a 1-39% stenosis.  Left Carotid: Velocities in the left ICA are consistent with a 1-39% stenosis.  Vertebrals: Bilateral vertebral arteries demonstrate antegrade flow.   EEG Report   Clinical History:  Altered mental status and  confusion.  MRI Brain is negative.   Technical Summary:  A 19 channel digital EEG recording was performed using the 10-20 international system of electrode placement.  Bipolar and Referential montages were used.  The total recording time was approx 20 minutes.   Findings:  There is a posterior dominant rhythm of 9-10 Hz reactive to eye opening and closure. While awake, there is intermittent frontally predominant rhythmic delta activity (FIRDA) as well as theta frequency intermittent generalized slowing.  There are no epileptiform discharges or electrographic seizures.  Sleep is not recorded.    Impression:  This is an abnormal EEG.  There is evidence of intermittent moderate to severe generalized slowing of brain activity consistent with a global encephalopathy with involvement of deep midline structures (eg thalamus).  This may be due to etiologies such as  toxic, infectious, metabolic, or raised intracranial pressure.  Clinical correlation is advised.  The patient is not in non-convulsive status epilepticus.       Discharge Instructions: Discharge Instructions    Diet - low sodium heart healthy   Complete by:  As directed    Discharge instructions   Complete by:  As directed    Please see discharge summary for further instructions   Increase activity slowly   Complete by:  As directed       Signed: Katherine Roan, MD 04/12/2017, 5:18 PM

## 2017-04-12 NOTE — IPOC Note (Signed)
Overall Plan of Care [facility]) Patient Details Name: Trevor [name] MRN: [id number] DOB: 03/23/1955  Admitting Diagnosis: Right BKA  Hospital Problems: Active Problems:   S/P below knee amputation, right (HCC)   Unilateral complete BKA, right, sequela ([place])   Benign essential HTN   Hypoalbuminemia due to protein-calorie malnutrition [facility])     Functional Problem List: Nursing Medication Management, Pain, Safety, Skin Integrity, Motor  PT Balance, Edema, Endurance, Motor, Pain, Safety, Sensory, Skin Integrity  OT Balance, Safety, Cognition, Edema, Skin Integrity, Endurance, Motor, Pain  SLP    TR         Basic ADL's: OT Grooming, Bathing, Dressing, Toileting     Advanced  ADL's: OT       Transfers: PT Bed Mobility, Bed to Chair, Car, Manufacturing systems engineer, Metallurgist: PT Ambulation, Stairs     Additional Impairments: OT None  SLP        TR      Anticipated Outcomes Item Anticipated Outcome  Self Feeding n/a  Swallowing      Basic self-care  supervision  Toileting  supervision   Bathroom Transfers supervision  Bowel/Bladder  continent of bowel and bladder during admission  Transfers  Supervision assist with LRAD   Locomotion  Mod I in [place]. supervision assist Ambulatory for short distances.   Communication     Cognition     Pain  pain free or pain less than 3  Safety/Judgment  free from fall and adhere to safety plan   Therapy Plan: PT Intensity: Minimum of 1-2 x/day ,45 to 90 minutes PT Frequency: 5 out of 7 days PT Duration Estimated Length of Stay: 7-10days  OT Intensity: Minimum of 1-2 x/day, 45 to 90 minutes OT Frequency: 5 out of 7 days OT Duration/Estimated Length of Stay: 7-10 days      Team Interventions: Nursing Interventions Patient/Family Education, Pain Management, Skin Care/Wound Management, Psychosocial Support, Disease Management/Prevention, Discharge Planning  PT interventions Ambulation/gait training, Discharge  planning, Balance/vestibular training, Cognitive remediation/compensation, Disease management/prevention, Community reintegration, DME/adaptive equipment instruction, Functional electrical stimulation, Functional mobility training, Pain management, Patient/family education, Neuromuscular re-education, Stair training, Psychosocial support, Therapeutic Activities, Skin care/wound management, Splinting/orthotics, Therapeutic Exercise, Visual/perceptual remediation/compensation, UE/LE Coordination activities, UE/LE Strength taining/ROM, Wheelchair propulsion/positioning  OT Interventions Training and development officer, Cognitive remediation/compensation, Discharge planning, DME/adaptive equipment instruction, Disease mangement/prevention, Functional mobility training, Pain management, Patient/family education, Psychosocial support, Self Care/advanced ADL retraining, Skin care/wound managment, Splinting/orthotics, Therapeutic Activities, Therapeutic Exercise, UE/LE Strength taining/ROM, Wheelchair propulsion/positioning  SLP Interventions    TR Interventions    SW/CM Interventions Discharge Planning, Psychosocial Support, Patient/Family Education   Barriers to Discharge MD  Medical stability, Wound care and Weight bearing restrictions  Nursing Decreased caregiver support, Medical stability, Weight bearing restrictions    PT Inaccessible home environment, Decreased caregiver support, Home environment access/layout, IV antibiotics    OT      SLP      SW       Team Discharge Planning: Destination: PT-Home ,OT- Home , SLP-  Projected Follow-up: PT-Home health PT, OT-  Home health OT, SLP-  Projected Equipment Needs: PT-Rolling walker with 5" wheels, Wheelchair cushion (measurements), Wheelchair (measurements), OT- Tub/shower bench, Other (comment), SLP-  Equipment Details: PT- , OT-drop arm BSC  Patient/family involved in discharge planning: PT- Patient,  OT-Patient, SLP-   MD ELOS: 5-8 days. Medical  Rehab Prognosis:  Good Assessment: 62 y.o.right-handed malewith history of diabetes mellitus, hypertension,legally blind, CKD stage III.Presented 03/29/2017 with findings of  osteomyelitis abscess of the right foot. 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 Sharol Given. Hospital course pain management and transition to oral Dilaudid as needed.Wound VAC applied for 1 week postoperatively.  Plan stump shrinker per [employer] prosthetics.  Blood cultures +1 of 2 for strep VIRIDANS bacteremia and advised IV ceftezole and through 04/15/2017.Acute blood loss anemia monitored. Creatinine stable with history of CKD stage III, baseline 2.06Physical therapy evaluation completed with recommendations of physical medicine rehab consult.  Patient was admitted for a comprehensive rehab program 04/04/2017.  Initially needing some encouragement to participate.    During his rehabilitation course patient with noted anemia, melena with gastroenterology consulted and advised supportive care with PPI.  He was transfused 2 units of packed red blood cells.  On 04/08/2017 patient with altered mental status and more somnolent.  Discharged to acute care services 04/08/2017 for ongoing workup. MRI of the brain reviewed, unremarkable for acute intracranial process. CT Angio of the chest showed no pulmonary emboli.  EEG negative.  Carotid Dopplers with no ICA stenosis.  Initial plan for lumbar puncture but discontinued.  Echocardiogram ?pending. Per neurology follow-up workup it was felt that altered mental status was related to his cephalosporin toxicity and Ancef was changed to Unasyn.  Latest blood cultures were collected 04/08/2017 no growth to date.  Mental status has continued to improve.  Patient with resulting functional deficits with mobility, transfers, and self-care.  Will set goals for Supervision/Mod  I at wheelchair level for most tasks with PT/OT.  See Team Conference Notes for weekly updates to the plan of care

## 2017-04-12 NOTE — Progress Notes (Signed)
Physical Therapy Treatment Patient Details Name: Trevor [name] MRN: [id number] DOB: 1955-04-28 Today's Date: 04/12/2017    History of Present Illness Patient is a 62 y/o male who was recently d/ced to CIR on 3/26 after Right BKA (2/75) complicated by bacteremia presents back with AMS- aphasia, difficulty swallowing and not able to follow commands. Head CT and MRI-unremarkable. EEG-abnormal global encephalopathy. PMH includes DM, HTN, CKD stage III, HLD, sleep apnea.    PT Comments    Pt limited today in his mobility by nausea, however willing to transfer to recliner with PT. Pt required increased verbal cues for procedure to scoot transfer to recliner. Pt did not recall transferring to chair with PT earlier in week. Once pt able to fully understand procedure required only min physical assist to laterally scoot transfer to recliner, requiring 3 scoots to achieve sitting in recliner. Pt continues to be a good candidate for CIR level care given his motivation for increased mobility and eventual prosthetic limb placement. PT will continue to follow acutely until d/c.     Follow Up Recommendations  CIR     Equipment Recommendations  None recommended by PT    Recommendations for Other Services       Precautions / Restrictions Precautions Precautions: Fall Required Braces or Orthoses: Other Brace/Splint Other Brace/Splint: Shrinker Restrictions Weight Bearing Restrictions: Yes RLE Weight Bearing: Non weight bearing    Mobility  Bed Mobility Overal bed mobility: Needs Assistance Bed Mobility: Supine to Sit     Supine to sit: Supervision     General bed mobility comments: supervision for safety, required bedrail assist to pull to upright  Transfers Overall transfer level: Needs assistance Equipment used: None Transfers: Lateral/Scoot Transfers          Lateral/Scoot Transfers: Min assist General transfer comment: min A for 3 "bump" lateral scoot transfer to  recliner   Modified Rankin (Stroke Patients Only) Modified Rankin (Stroke Patients Only) Pre-Morbid Rankin Score: No significant disability Modified Rankin: Severe disability     Balance Overall balance assessment: Needs assistance Sitting-balance support: Feet supported;No upper extremity supported Sitting balance-Leahy Scale: Fair                                      Cognition Arousal/Alertness: Awake/alert Behavior During Therapy: Flat affect Overall Cognitive Status: Impaired/Different from baseline Area of Impairment: Problem solving;Following commands;Awareness                   Current Attention Level: Alternating   Following Commands: Follows multi-step commands with increased time   Awareness: Intellectual Problem Solving: Slow processing;Requires verbal cues;Difficulty sequencing;Requires tactile cues;Decreased initiation               Pertinent Vitals/Pain Pain Assessment: Faces Faces Pain Scale: Hurts little more Pain Location: RLE with movement Pain Descriptors / Indicators: Guarding;Grimacing;Moaning Pain Intervention(s): Monitored during session;Limited activity within patient's tolerance;Repositioned           PT Goals (current goals can now be found in the care plan section) Acute Rehab PT Goals PT Goal Formulation: With patient Time For Goal Achievement: 04/23/17 Potential to Achieve Goals: Good Progress towards PT goals: Progressing toward goals    Frequency    Min 3X/week      PT Plan Current plan remains appropriate       AM-PAC PT "6 Clicks" Daily Activity  Outcome Measure  Difficulty turning over in bed (  including adjusting bedclothes, sheets and blankets)?: None Difficulty moving from lying on back to sitting on the side of the bed? : Unable Difficulty sitting down on and standing up from a chair with arms (e.g., wheelchair, bedside commode, etc,.)?: Unable Help needed moving to and from a bed to chair  (including a wheelchair)?: A Little Help needed walking in hospital room?: Total Help needed climbing 3-5 steps with a railing? : Total 6 Click Score: 11    End of Session Equipment Utilized During Treatment: Other (comment)(shrinker) Activity Tolerance: Other (comment)(treatment limited secondary to nausea) Patient left: with call bell/phone within reach;in chair;with chair alarm set Nurse Communication: Mobility status PT Visit Diagnosis: Other abnormalities of gait and mobility (R26.89);Difficulty in walking, not elsewhere classified (R26.2);Apraxia (R48.2) Pain - Right/Left: Right Pain - part of body: Ankle and joints of foot     Time: 2993-7169 PT Time Calculation (min) (ACUTE ONLY): 17 min  Charges:  $Therapeutic Activity: 8-22 mins                    G Codes:       Versia Mignogna B. Migdalia Dk PT, DPT Acute Rehabilitation  [phone] Pager [phone]     [place] 04/12/2017, 12:35 PM

## 2017-04-12 NOTE — Progress Notes (Signed)
Patient rested throughout the night, seems he is having mild discomfort in leg with amputation, obtained one time order for Toradol 15 mg IV which helped him sleep.

## 2017-04-12 NOTE — Progress Notes (Signed)
Patient ID: Trevor [name], male   DOB: Dec 31, 1955, 62 y.o.   MRN: [id number] Patient arrived to the unit and re-oriented to rehab. Fall prevention plan signed and rehab safety plan filled out. Patient resting comfortably in bed with bed alarm on and call bell at patient's side.

## 2017-04-12 NOTE — H&P (Addendum)
Physical Medicine and Rehabilitation Admission H&P    Chief complaint: Stump pain  HPI: Trevor Gladish Murdockis a 62 y.o.right-handed malewith history of diabetes mellitus, hypertension,legally blind, CKD stage III.Per chartreview and patient, patientlives with nephew in [place]. Nephew attends school and works.Family in area work.Presented 03/29/2017 with findings of osteomyelitis abscess of the right foot. 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 Sharol Given. Hospital course pain management and transition to oral Dilaudid as needed.Wound VAC applied for 1 week postoperatively.  Plan stump shrinker per [employer] prosthetics.  Blood cultures +1 of 2 for strep VIRIDANS bacteremia and advised IV ceftezole and through 04/15/2017.Acute blood loss anemia monitored. Creatinine stable with history of CKD stage III, baseline 2.06Physical therapy evaluation completed with recommendations of physical medicine rehab consult.  Patient was admitted for a comprehensive rehab program 04/04/2017.  Initially needing some encouragement to participate.    During his rehabilitation course patient with noted anemia, melena with gastroenterology consulted and advised supportive care with PPI.  He was transfused 2 units of packed red blood cells.  On 04/08/2017 patient with altered mental status and more somnolent.  Discharged to acute care services 04/08/2017 for ongoing workup. MRI of the brain reviewed, unremarkable for acute intracranial process. CT Angio of the chest showed no pulmonary emboli.  EEG negative.  Carotid Dopplers with no ICA stenosis.  Initial plan for lumbar puncture but discontinued.  Echocardiogram ?pending. Per neurology follow-up workup it was felt that altered mental status was related to his cephalosporin toxicity and Ancef was changed to Unasyn.  Latest blood  cultures were collected 04/08/2017 no growth to date.  Mental status has continued to improve.  Physical and occupational therapy has resumed and patient was readmitted for a comprehensive rehab program.    Review of Systems  Constitutional: Negative for chills and fever.  HENT: Negative for hearing loss.   Eyes:       Legally blind  Respiratory: Negative for cough and shortness of breath.   Cardiovascular: Negative for chest pain and palpitations.  Gastrointestinal: Positive for blood in stool. Negative for nausea and vomiting.  Genitourinary: Negative for dysuria, flank pain and hematuria.  Musculoskeletal: Positive for joint pain.  Neurological: Negative for seizures.  All other systems reviewed and are negative.  Past Medical History:  Diagnosis Date  . Diabetes mellitus without complication ([facility])   . Heart murmur   . Hyperlipidemia   . Hypertension   . Sleep apnea    Past Surgical History:  Procedure Laterality Date  . AMPUTATION Right 03/31/2017   Procedure: RIGHT FOOT 1ST AND 2ND RAY AMPUTATION;  Surgeon: Newt Minion, MD;  Location: [facility];  Service: Orthopedics;  Laterality: Right;  . AMPUTATION Right 04/01/2017   Procedure: AMPUTATION BELOW KNEE;  Surgeon: Newt Minion, MD;  Location: [facility];  Service: Orthopedics;  Laterality: Right;  . CORNEAL TRANSPLANT     History reviewed. No pertinent family history of amputation. Social History:  reports that he has never smoked. He has never used smokeless tobacco. He reports that he does not drink alcohol or use drugs. Allergies:  Allergies  Allergen Reactions  . Enalapril Cough   Medications Prior to Admission  Medication Sig Dispense Refill  . ceFAZolin (ANCEF) 2-4 GM/100ML-% IVPB Inject 100 mLs (2 g total) into the vein every 6 (six) hours for 11 days. 1 each   . cyclobenzaprine (FLEXERIL) 10 MG  tablet Take 10 mg by mouth 3 (three) times daily as needed for muscle spasms.    . fluticasone (FLONASE) 50 MCG/ACT nasal  spray Place 2 sprays into both nostrils daily.    [name] glucose blood (TRUE METRIX BLOOD GLUCOSE TEST) test strip Use as instructed (Patient not taking: Reported on 03/29/2017) 100 each 12  . guaiFENesin (MUCINEX) 600 MG 12 hr tablet Take 1 tablet (600 mg total) by mouth 2 (two) times daily as needed for cough or to loosen phlegm. 30 tablet 0  . hydrochlorothiazide (MICROZIDE) 12.5 MG capsule Take 1 capsule (12.5 mg total) by mouth daily. 30 capsule 0  . insulin aspart (NOVOLOG) 100 UNIT/ML injection Inject 0-20 Units into the skin 3 (three) times daily with meals. 10 mL 11  . insulin aspart (NOVOLOG) 100 UNIT/ML injection Inject 0-5 Units into the skin at bedtime. 10 mL 11  . insulin aspart (NOVOLOG) 100 UNIT/ML injection Inject 10 Units into the skin 3 (three) times daily with meals. 10 mL 11  . insulin detemir (LEVEMIR) 100 UNIT/ML injection Inject 0.4 mLs (40 Units total) into the skin at bedtime. 10 mL 11  . losartan (COZAAR) 25 MG tablet Take 100 mg by mouth daily.    . metFORMIN (GLUCOPHAGE) 1000 MG tablet Take 1,000 mg by mouth 2 (two) times daily.    [name] NIFEdipine (PROCARDIA XL/ADALAT-CC) 90 MG 24 hr tablet Take 1 tablet (90 mg total) by mouth daily. 30 tablet 0  . oxyCODONE-acetaminophen (PERCOCET/ROXICET) 5-325 MG tablet Take 1-2 tablets by mouth every 6 (six) hours as needed for moderate pain or severe pain. 30 tablet 0  . polyethylene glycol (MIRALAX / GLYCOLAX) packet Take 17 g by mouth daily as needed for mild constipation. 14 each 0  . senna-docusate (SENOKOT-S) 8.6-50 MG tablet Take 1 tablet by mouth at bedtime. 30 tablet 0    Drug Regimen Review Drug regimen was reviewed and remains appropriate with no significant issues identified  Home: Home Living Family/patient expects to be discharged to:: Private residence Living Arrangements: Other relatives Available Help at Discharge: Family, Available PRN/intermittently Type of Home: House Home Access: Ramped entrance Home Layout: One  level Bathroom Shower/Tub: Tub/shower unit, Architectural technologist: Standard Home Equipment: None  Lives With: Family   Functional History: Prior Function Level of Independence: Independent Comments: Independent prior to BKA and dc to CIR.   Functional Status:  Mobility: Bed Mobility Overal bed mobility: Needs Assistance Bed Mobility: Supine to Sit, Sit to Supine Supine to sit: Min assist, HOB elevated Sit to supine: Min guard, HOB elevated General bed mobility comments: Attempted mobility with max verbal and tactile cues, able to initiate movement of LLE but moved back into bed each attempted movement. Declined any further movement after 15 mins of effort.  Transfers Overall transfer level: Needs assistance Equipment used: None Transfers: Lateral/Scoot Transfers  Lateral/Scoot Transfers: Min assist General transfer comment: declined Ambulation/Gait General Gait Details: unable    ADL: ADL Overall ADL's : Needs assistance/impaired Eating/Feeding: Minimal assistance, Bed level Eating/Feeding Details (indicate cue type and reason): Pt stating "I hate the food" when moving his table to bedside.  Grooming: Wash/dry face, Sitting, Supervision/safety Grooming Details (indicate cue type and reason): Pt washing his face at bed level demosntrating WFL for BUEs. Pt able to follow simple cue to "take wash clothe" and "wash your face" Upper Body Bathing: Moderate assistance, Bed level Lower Body Bathing: Maximal assistance, Bed level Upper Body Dressing : Moderate assistance, Bed level Lower Body Dressing: Maximal  assistance, Bed level Lower Body Dressing Details (indicate cue type and reason): Pt able to elevate LLE for donning sock with Max A. General ADL Comments: encouraged pt to sit EOB.  Pt initially agreed then declined.  Re Explained role of OT ann importance of participation. Pt agreed to BUE Exercise.  Pt provided with blue theraband and instructed in use of shoulder flexion.  Pt was agreable 1 st 2 exercises then became agitated when RN came in the room and he didnt invite him in.  OT session ended at this point  Cognition: Cognition Overall Cognitive Status: Impaired/Different from baseline Arousal/Alertness: Awake/alert Orientation Level: Oriented to person, Oriented to place, Disoriented to time, Disoriented to situation(periods of confusion) Cognition Arousal/Alertness: Awake/alert Behavior During Therapy: Agitated Overall Cognitive Status: Impaired/Different from baseline Area of Impairment: Orientation, Attention, Problem solving, Following commands, Awareness Orientation Level: Disoriented to, Time Current Attention Level: Focused Following Commands: (able to lift RLE with tactile cues to donn sock) Awareness: Intellectual Problem Solving: Slow processing, Requires verbal cues, Difficulty sequencing, Requires tactile cues, Decreased initiation General Comments: Perseverating on multiple topics including "echo location" "lose your license" "please remember you are part of the miracle".  Inconsistantly answering simple questions. Easily distracted. Pt becoming irritated and agitated when therapist would silence IV or move foley bag. Squeezing therapist's hands aggressviely at times during irritation.  Difficult to assess due to: Impaired communication  Physical Exam: Blood pressure (!) 128/101, pulse (!) 103, temperature 98.6 F (37 C), temperature source Oral, resp. rate 16, height _0  (1.93 m), weight (!) 138.3 kg (304 lb 14.3 oz), SpO2 100 %. Physical Exam  Vitals reviewed. Constitutional: He is oriented to person, place, and time. He appears well-developed.  62 year old right-handed obese male   HENT:  Head: Normocephalic and atraumatic.  Eyes: EOM are normal. Right eye exhibits no discharge. Left eye exhibits no discharge.  Pupils sluggish but reactive to light  Neck: Normal range of motion. Neck supple. No thyromegaly present.  Cardiovascular:  Normal rate, regular rhythm and normal heart sounds.  Respiratory: Effort normal and breath sounds normal. No respiratory distress.  GI: Soft. Bowel sounds are normal. He exhibits no distension.  Musculoskeletal:  TTP right stump  Neurological: He is alert and oriented to person, place, and time.  He recalled the due date on when to remove his wound VAC.   Followed simple commands. Motor: B/l UE, LLE 5/5 proximal to distal Right HF: 4+/5  Skin: Skin is warm and dry.  Right BKA site clean and dry with wound VAC in place  Psychiatric: His affect is blunt. His speech is delayed. He is slowed.    Results for orders placed or performed during the hospital encounter of 04/08/17 (from the past 48 hour(s))  Glucose, capillary     Status: None   Collection Time: 04/10/17  1:15 PM  Result Value Ref Range   Glucose-Capillary 94 65 - 99 mg/dL   Comment 1 Notify RN    Comment 2 Document in Chart   Glucose, capillary     Status: None   Collection Time: 04/10/17  4:13 PM  Result Value Ref Range   Glucose-Capillary 72 65 - 99 mg/dL   Comment 1 Notify RN    Comment 2 Document in Chart   Glucose, capillary     Status: Abnormal   Collection Time: 04/10/17  5:49 PM  Result Value Ref Range   Glucose-Capillary 117 (H) 65 - 99 mg/dL  Glucose, capillary     Status:  None   Collection Time: 04/10/17  9:28 PM  Result Value Ref Range   Glucose-Capillary 93 65 - 99 mg/dL   Comment 1 Notify RN    Comment 2 Document in Chart   Comprehensive metabolic panel     Status: Abnormal   Collection Time: 04/11/17  4:29 AM  Result Value Ref Range   Sodium 142 135 - 145 mmol/L   Potassium 3.1 (L) 3.5 - 5.1 mmol/L   Chloride 102 101 - 111 mmol/L   CO2 29 22 - 32 mmol/L   Glucose, Bld 158 (H) 65 - 99 mg/dL   BUN 10 6 - 20 mg/dL   Creatinine, Ser 1.14 0.61 - 1.24 mg/dL   Calcium 8.8 (L) 8.9 - 10.3 mg/dL   Total Protein 5.7 (L) 6.5 - 8.1 g/dL   Albumin 2.4 (L) 3.5 - 5.0 g/dL   AST 30 15 - 41 U/L   ALT 17 17 -  63 U/L   Alkaline Phosphatase 58 38 - 126 U/L   Total Bilirubin 0.4 0.3 - 1.2 mg/dL   GFR calc non Af Amer >60 >60 mL/min   GFR calc Af Amer >60 >60 mL/min    Comment: (NOTE) The eGFR has been calculated using the CKD EPI equation. This calculation has not been validated in all clinical situations. eGFR's persistently <60 mL/min signify possible Chronic Kidney Disease.    Anion gap 11 5 - 15    Comment: Performed at [facility] [address], [place], [place] [address]  Glucose, capillary     Status: Abnormal   Collection Time: 04/11/17  6:10 AM  Result Value Ref Range   Glucose-Capillary 145 (H) 65 - 99 mg/dL   Comment 1 Notify RN    Comment 2 Document in Chart   Glucose, capillary     Status: Abnormal   Collection Time: 04/11/17 11:32 AM  Result Value Ref Range   Glucose-Capillary 322 (H) 65 - 99 mg/dL   Comment 1 Notify RN    Comment 2 Document in Chart   Glucose, capillary     Status: None   Collection Time: 04/11/17  5:13 PM  Result Value Ref Range   Glucose-Capillary 95 65 - 99 mg/dL   Comment 1 Notify RN    Comment 2 Document in Chart   Glucose, capillary     Status: None   Collection Time: 04/11/17  9:06 PM  Result Value Ref Range   Glucose-Capillary 90 65 - 99 mg/dL   Comment 1 Notify RN    Comment 2 Document in Chart   CBC     Status: Abnormal   Collection Time: 04/12/17  4:03 AM  Result Value Ref Range   WBC 7.4 4.0 - 10.5 K/uL   RBC 3.12 (L) 4.22 - 5.81 MIL/uL   Hemoglobin 8.4 (L) 13.0 - 17.0 g/dL   HCT 26.5 (L) 39.0 - 52.0 %   MCV 84.9 78.0 - 100.0 fL   MCH 26.9 26.0 - 34.0 pg   MCHC 31.7 30.0 - 36.0 g/dL   RDW 16.9 (H) 11.5 - 15.5 %   Platelets 461 (H) 150 - 400 K/uL    Comment: Performed at [facility], [address], [place], [place] [address]  Basic metabolic panel     Status: Abnormal   Collection Time: 04/12/17  4:03 AM  Result Value Ref Range   Sodium 140 135 - 145 mmol/L   Potassium 3.0 (L) 3.5 - 5.1 mmol/L  Chloride 105  101 - 111 mmol/L   CO2 27 22 - 32 mmol/L   Glucose, Bld 106 (H) 65 - 99 mg/dL   BUN 11 6 - 20 mg/dL   Creatinine, Ser 1.09 0.61 - 1.24 mg/dL   Calcium 8.3 (L) 8.9 - 10.3 mg/dL   GFR calc non Af Amer >60 >60 mL/min   GFR calc Af Amer >60 >60 mL/min    Comment: (NOTE) The eGFR has been calculated using the CKD EPI equation. This calculation has not been validated in all clinical situations. eGFR's persistently <60 mL/min signify possible Chronic Kidney Disease.    Anion gap 8 5 - 15    Comment: Performed at [facility] [address] [place], [place] [address]  Magnesium     Status: Abnormal   Collection Time: 04/12/17  4:28 AM  Result Value Ref Range   Magnesium 1.6 (L) 1.7 - 2.4 mg/dL    Comment: Performed at [facility] [address], [place], [place] [address]  Glucose, capillary     Status: Abnormal   Collection Time: 04/12/17  6:14 AM  Result Value Ref Range   Glucose-Capillary 121 (H) 65 - 99 mg/dL   Comment 1 Notify RN    Comment 2 Document in Chart    No results found.  Medical Problem List and Plan: 1.  Decreased functional mobility secondary to right transtibial amputation 9/38/1829 complicated by acute encephalopathy related to Cephalosporin toxicity.  Wound VAC can be discontinued. 2.  DVT Prophylaxis/Anticoagulation: Subcutaneous Lovenox resumed for DVT prophylaxis 3. Pain Management: Tylenol as needed 4. Mood: Provide emotional support 5. Neuropsych: This patient is capable of making decisions on his own behalf. 6. Skin/Wound Care: Routine skin checks 7. Fluids/Electrolytes/Nutrition: Routine I/O's with follow-up chemistries 8.  ID.  Viridans strep bacteremia.  Presently on Unasyn after Ancef toxicity.  Will discuss duration of antibiotic therapy 9.  Anemia/melena.  Follow-up CBC.  Conservative care by GI.  Continue Protonix twice daily 10.  Hypertension.  Cozaar 100 mg daily, Procardia 90 mg daily.  Monitor with increased mobility 11.  Diabetes  mellitus with peripheral neuropathy.  Hemoglobin A1c 12.5.  Levemir 40 units nightly, NovoLog 10 units 3 times daily.  Check blood sugars before meals and at bedtime. 12.  Constipation.  Laxative assistance 13.  Legally blind. 14.  CKD stage III.  Follow-up chemistries   Post Admission Physician Evaluation: 1. Preadmission assessment reviewed and changes made below. 2. Functional deficits secondary  to right transtibial amputation 9/37/1696 complicated by acute encephalopathy. 3. Patient is admitted to receive collaborative, interdisciplinary care between the physiatrist, rehab nursing staff, and therapy team. 4. Patient's level of medical complexity and substantial therapy needs in context of that medical necessity cannot be provided at a lesser intensity of care such as a SNF. 5. Patient has experienced substantial functional loss from his/her baseline which was documented above under the "Functional History" and "Functional Status" headings.  Judging by the patient's diagnosis, physical exam, and functional history, the patient has potential for functional progress which will result in measurable gains while on inpatient rehab.  These gains will be of substantial and practical use upon discharge  in facilitating mobility and self-care at the household level. 6. Physiatrist will provide 24 hour management of medical needs as well as oversight of the therapy plan/treatment and provide guidance as appropriate regarding the interaction of the two. 7. 24 hour rehab nursing will assist with bladder management, bowel management, safety, skin/wound  care, disease management, pain management and patient education  and help integrate therapy concepts, techniques,education, etc. 8. PT will assess and treat for/with: Lower extremity strength, range of motion, stamina, balance, functional mobility, safety, adaptive techniques and equipment, woundcare, coping skills, pain control, pre-prosthetic education.    Goals are: Mod I. 9. OT will assess and treat for/with: ADL's, functional mobility, safety, upper extremity strength, adaptive techniques and equipment, wound mgt, ego support, and community reintegration.   Goals are: Mod I. Therapy may not proceed with showering this patient. 10. Case Management and Social Worker will assess and treat for psychological issues and discharge planning. 11. Team conference will be held weekly to assess progress toward goals and to determine barriers to discharge. 12. Patient will receive at least 3 hours of therapy per day at least 5 days per week. 13. ELOS: 7-10 days.       14. Prognosis:  good  Delice Lesch, MD, ABPMR Lavon Paganini Angiulli, PA-C 04/12/2017

## 2017-04-12 NOTE — Progress Notes (Signed)
Pt discharge education and instructions completed. Pt discharge to CIR and report called off to nurse Stacey in [place]. Pt transported off unit via bed with IV intact and transfusing. Wound vac on to Right BKA stump intact with no drainage noted. Pt belongings to the side along with family. Delia Heady RN

## 2017-04-12 NOTE — Progress Notes (Signed)
Subjective:  No acute events overnight. Alert and oriented x 3, can add change, answers abstract thinking questions, no nausea/vomiting or diarrhea, no chest pain.     Objective:  Vital signs in last 24 hours: Vitals:   04/11/17 1242 04/11/17 1608 04/11/17 2030 04/12/17 0323  BP: 132/90 (!) 131/103 (!) 148/91 130/85  Pulse:  98 98 (!) 103  Resp:  16 17 16   Temp:  98.5 F (36.9 C) 98.4 F (36.9 C) 98 F (36.7 C)  TempSrc:  Oral Oral Oral  SpO2:  100% 100% 100%  Weight:      Height:       Physical Exam  Constitutional:  Elderly, obese male sitting up in chair in no acute distress.  HENT:  Head: Normocephalic and atraumatic.  Eyes: EOM are normal. No scleral icterus.  Neck: Normal range of motion. Neck supple.  Cardiovascular: Regular rhythm and normal heart sounds. Exam reveals no gallop and no friction rub.  No murmur heard. Pulmonary/Chest: Breath sounds normal. No respiratory distress. He has no wheezes. He has no rales.  Abdominal: Soft. Bowel sounds are normal. He exhibits no distension. There is no tenderness.  Musculoskeletal: He exhibits deformity (S/p R BKA). He exhibits no edema.  Neurological: He is alert.  Patient is alert and oriented x3.  Able to comprehend well and much less delayed with answers.  Can perform counting, abstract questions (meaning of grass is always greener on other side) .   He is able to follow commands.  No cranial nerve deficits noted.  Normal strength, No focal weakness   CBC Latest Ref Rng & Units 04/12/2017 04/10/2017 04/09/2017  WBC 4.0 - 10.5 K/uL 7.4 11.8(H) 9.3  Hemoglobin 13.0 - 17.0 g/dL 8.4(L) 9.6(L) 9.5(L)  Hematocrit 39.0 - 52.0 % 26.5(L) 29.7(L) 29.5(L)  Platelets 150 - 400 K/uL 461(H) 551(H) 541(H)     Assessment/Plan:  Principal Problem:   Other encephalopathy Active Problems:   Essential hypertension   Uncontrolled type 2 diabetes mellitus with hyperglycemia, with long-term current use of insulin (HCC)   CKD stage 3  due to type 2 diabetes mellitus ([facility])   Streptococcal bacteremia   Upper GI bleed   Encephalopathy   Acute encephalopathy of unclear etiology:Patient presented with a 2-day history of altered mentation including expressive aphasia, slow speech, and trouble swallowing and following commands.Neurology consulted, extensive workup negative, EEG shows intermittent moderate to severe generalized slowing consistent with global encephalopathy ddx toxic, infectious, metabolic, increased ICP, most likely cephalosporin toxicity, switched pt to unasyn, pt improved dramatically the following day.    - passed swallow eval - Blood cultures recollected 3/30 no growth to date   - ammonia level normal   Normocytic anemia:Patient was noted to be anemic during previous admission that was thought to be secondary to acute blood loss after surgery for R BKA. While at CIR, his Hgb has continued to drop and required 2 units of blood.  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  CBC yesterday revealed a mild leukocytosis of 10.9.  We will continue to monitor.  We will get blood cultures as below.  We will continue current antibiotic therapy  with unasyn and consider broadening if he becomes febrile and clinically decompensates.  - Continue Unasyn until 4/6 - repeat blood cultures negative - Consider broadening antibiotic therapy if patient becomes febrile and clinically decompensates  HTN:  Continues to be hypertensive in the setting of holding antihypertensive medications due to stroke workup.  CT and brain MRI without evidence of acute acute process.  Will resume home  medications.  - Resume home losartan 25 mg QD - restarted home nifedipine 90mg  - Holding home HCTZ 12.5 mg QD    Uncontrolled T2DM: Hemoglobin A1c 12.5. His home regimen was levemir 60u BID,metformin 1000mg  BID, and glipizide 10mg  BID. He was discharged on 3/26 onmetformin 1000mg  BID, levemir 40u QHS, and Novolog 10u TID.   CBGs at goal on current regimen.  Will continue as below.  - Levemir 40U QHS  - SSI-M + 10 units with meals    CKD Stage III: Presented with AKI during previous admission with Cr 2.8, which improved to 1.17 on day of discharge. No history of CKD prior. Most recent BMP on 3/27 with Cr 1.25. Renal function currently stable.   -continue to monitor  Dispo: Anticipate return to CIR later today vs tomorrow   Katherine Roan, MD 04/12/2017, 7:02 AM Vickki Muff MD PGY-1 Internal Medicine Pager # [phone]

## 2017-04-13 ENCOUNTER — Inpatient Hospital Stay (HOSPITAL_COMMUNITY): Payer: Medicare Other

## 2017-04-13 ENCOUNTER — Inpatient Hospital Stay (HOSPITAL_COMMUNITY): Payer: Self-pay | Admitting: Occupational Therapy

## 2017-04-13 ENCOUNTER — Inpatient Hospital Stay (HOSPITAL_COMMUNITY): Payer: Self-pay | Admitting: Physical Therapy

## 2017-04-13 DIAGNOSIS — S88111S Complete traumatic amputation at level between knee and ankle, right lower leg, sequela: Secondary | ICD-10-CM

## 2017-04-13 DIAGNOSIS — I10 Essential (primary) hypertension: Secondary | ICD-10-CM

## 2017-04-13 DIAGNOSIS — E8809 Other disorders of plasma-protein metabolism, not elsewhere classified: Secondary | ICD-10-CM

## 2017-04-13 DIAGNOSIS — Z89511 Acquired absence of right leg below knee: Secondary | ICD-10-CM

## 2017-04-13 DIAGNOSIS — E46 Unspecified protein-calorie malnutrition: Secondary | ICD-10-CM

## 2017-04-13 LAB — CBC WITH DIFFERENTIAL/PLATELET
BASOS ABS: 0 10*3/uL (ref 0.0–0.1)
Basophils Relative: 0 %
EOS PCT: 4 %
Eosinophils Absolute: 0.2 10*3/uL (ref 0.0–0.7)
HEMATOCRIT: 24.4 % — AB (ref 39.0–52.0)
HEMOGLOBIN: 7.9 g/dL — AB (ref 13.0–17.0)
LYMPHS ABS: 1.8 10*3/uL (ref 0.7–4.0)
LYMPHS PCT: 30 %
MCH: 27.8 pg (ref 26.0–34.0)
MCHC: 32.4 g/dL (ref 30.0–36.0)
MCV: 85.9 fL (ref 78.0–100.0)
MONOS PCT: 7 %
Monocytes Absolute: 0.4 10*3/uL (ref 0.1–1.0)
Neutro Abs: 3.6 10*3/uL (ref 1.7–7.7)
Neutrophils Relative %: 59 %
Platelets: 397 10*3/uL (ref 150–400)
RBC: 2.84 MIL/uL — AB (ref 4.22–5.81)
RDW: 17.4 % — ABNORMAL HIGH (ref 11.5–15.5)
WBC: 6 10*3/uL (ref 4.0–10.5)

## 2017-04-13 LAB — GLUCOSE, CAPILLARY
GLUCOSE-CAPILLARY: 93 mg/dL (ref 65–99)
Glucose-Capillary: 96 mg/dL (ref 65–99)
Glucose-Capillary: 155 mg/dL — ABNORMAL HIGH (ref 65–99)
Glucose-Capillary: 105 mg/dL — ABNORMAL HIGH (ref 65–99)

## 2017-04-13 LAB — COMPREHENSIVE METABOLIC PANEL
ALBUMIN: 2.3 g/dL — AB (ref 3.5–5.0)
ALK PHOS: 52 U/L (ref 38–126)
ALT: 14 U/L — AB (ref 17–63)
AST: 23 U/L (ref 15–41)
Anion gap: 7 (ref 5–15)
BILIRUBIN TOTAL: 0.5 mg/dL (ref 0.3–1.2)
BUN: 12 mg/dL (ref 6–20)
CALCIUM: 8.1 mg/dL — AB (ref 8.9–10.3)
CO2: 26 mmol/L (ref 22–32)
CREATININE: 1.13 mg/dL (ref 0.61–1.24)
Chloride: 107 mmol/L (ref 101–111)
GFR calc Af Amer: >60 mL/min (ref >60)
GLUCOSE: 89 mg/dL (ref 65–99)
Potassium: 3.2 mmol/L — ABNORMAL LOW (ref 3.5–5.1)
Sodium: 140 mmol/L (ref 135–145)
TOTAL PROTEIN: 5.2 g/dL — AB (ref 6.5–8.1)

## 2017-04-13 LAB — CBC
HCT: 26.9 % — ABNORMAL LOW (ref 39.0–52.0)
Hemoglobin: 8.5 g/dL — ABNORMAL LOW (ref 13.0–17.0)
MCH: 27.2 pg (ref 26.0–34.0)
MCHC: 31.6 g/dL (ref 30.0–36.0)
MCV: 85.9 fL (ref 78.0–100.0)
PLATELETS: 462 10*3/uL — AB (ref 150–400)
RBC: 3.13 MIL/uL — AB (ref 4.22–5.81)
RDW: 17.1 % — AB (ref 11.5–15.5)
WBC: 9.4 10*3/uL (ref 4.0–10.5)

## 2017-04-13 MED ORDER — POTASSIUM CHLORIDE CRYS ER 20 MEQ PO TBCR
20.0000 meq | EXTENDED_RELEASE_TABLET | Freq: Two times a day (BID) | ORAL | Status: DC
  Administered 2017-04-13 – 2017-04-17 (×10): 20 meq via ORAL
  Filled 2017-04-13 (×10): qty 1

## 2017-04-13 MED ORDER — NAPHAZOLINE-GLYCERIN 0.012-0.2 % OP SOLN: 1.0000 [drp] | Freq: Four times a day (QID) | OPHTHALMIC | Status: DC | PRN

## 2017-04-13 MED ORDER — PRO-STAT SUGAR FREE PO LIQD
30.0000 mL | Freq: Two times a day (BID) | ORAL | Status: DC
  Administered 2017-04-13 – 2017-04-28 (×31): 30 mL via ORAL
  Filled 2017-04-13 (×31): qty 30

## 2017-04-13 NOTE — Progress Notes (Signed)
Patient information reviewed and entered into eRehab system by Zoha Spranger, RN, CRRN, PPS Coordinator.  Information including medical coding and functional independence measure will be reviewed and updated through discharge.    

## 2017-04-13 NOTE — Evaluation (Signed)
Occupational Therapy Assessment and Plan  Patient Details  Name: Trevor [name] MRN: [id number] Date of Birth: September 05, 1955  OT Diagnosis: acute pain, cognitive deficits and muscle weakness (generalized) Rehab Potential: Rehab Potential (ACUTE ONLY): Good ELOS: 7-10 days   Today's Date: 04/13/2017 OT Individual Time:  - 10:00- 11:00 (45mn)       Problem List:  Patient Active Problem List   Diagnosis Date Noted  . Unilateral complete BKA, right, sequela ([place]   . Benign essential HTN   . Hypoalbuminemia due to protein-calorie malnutrition (HP[place]   . S/P below knee amputation, right ([place] 04/12/2017  . Acute blood loss anemia   . Other encephalopathy 04/08/2017  . Encephalopathy 04/08/2017  . Altered mental status 04/08/2017  . Labile blood pressure   . Labile blood glucose   . Drug induced constipation   . Stage 3 chronic kidney disease ([place]   . Bacteremia   . S/P unilateral BKA (below knee amputation), right ([place] 04/04/2017  . PAD (peripheral artery disease) (HP[place]   . Type 2 diabetes mellitus with right diabetic foot ulcer (HW[place]   . Post-operative pain   . Legally blind   . Upper GI bleed   . Streptococcal bacteremia 04/01/2017  . Hypokalemia 03/30/2017  . Uncontrolled type 2 diabetes mellitus with hyperglycemia, with long-term current use of insulin ([place] 03/30/2017  . Type 2 diabetes mellitus with peripheral neuropathy (HF[place] 03/30/2017  . AKI (acute kidney injury) ([facility] 03/30/2017  . CKD stage 3 due to type 2 diabetes mellitus (HP[place] 03/30/2017  . Sepsis (HC[place] 03/30/2017  . Heart murmur 11/22/2016  . Hyperlipidemia associated with type 2 diabetes mellitus ([place] 11/22/2016  . Obstructive sleep apnea syndrome 11/22/2016  . Essential hypertension 12/17/2012    Past Medical History:  Past Medical History:  Diagnosis Date  . Diabetes mellitus without complication ([facility]   . Heart murmur   . Hyperlipidemia   . Hypertension   . Sleep apnea    Past Surgical History:   Past Surgical History:  Procedure Laterality Date  . AMPUTATION Right 03/31/2017   Procedure: RIGHT FOOT 1ST AND 2ND RAY AMPUTATION;  Surgeon: DNewt Minion MD;  [place]  Service: Orthopedics;  Laterality: Right;  . AMPUTATION Right 04/01/2017   Procedure: AMPUTATION BELOW KNEE;  Surgeon: DNewt Minion MD;  [place]  Service: Orthopedics;  Laterality: Right;  . CORNEAL TRANSPLANT      Assessment & Plan Clinical Impression: Patient is a 62y.o. year old male right-handed malewith history of diabetes mellitus, hypertension,legally blind, CKD stage III.Per chartreview and patient, [place] Nephew att[place] school and works.Family in area work.Presented 03/29/2017 with findings of osteomyelitis abscess of the right foot. 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 DSharol Given Hospital course pain managementand transition to oral Dilaudid as needed.Wound VAC appliedfor 1 week postoperatively.Plan stump shrinker per [facility]prosthetics.Blood cultures +1 of 2 for strep VIRIDANSbacteremia and advised IV ceftezole and through 04/15/2017.Acute blood loss anemia monitored. Creatinine stable with history of CKD stage III, baseline 2.06Physical therapy evaluation completed with recommendations of physical medicine rehab consult.Patient was admitted for a comprehensive rehab program 04/04/2017.  Initially needing some encouragement to participate.    During his rehabilitation course patient with noted anemia, melena with gastroenterology consulted and advised supportive care with PPI.  He was transfused 2 units of packed red blood cells.  On 04/08/2017 patient  with altered mental status and more somnolent.  Discharged to acute care services 04/08/2017 for ongoing workup. MRI of the brain reviewed, unremarkable for acute  intracranial process. CT Angio of the chest showed no pulmonary emboli.  EEG negative.  Carotid Dopplers with no ICA stenosis.  Initial plan for lumbar puncture but discontinued.  Echocardiogram ?pending. Per neurology follow-up workup it was felt that altered mental status was related to his cephalosporin toxicity and Ancef was changed to Unasyn.  Latest blood cultures were collected 04/08/2017 no growth to date.  Mental status has continued to improve.    Patient transferred to CIR on 04/12/2017 .    Patient currently requires min with basic self-care skills and basic mobility  secondary to muscle weakness, decreased cardiorespiratoy endurance and decreased sitting balance, decreased standing balance, decreased balance strategies and difficulty maintaining precautions.  Prior to hospitalization, patient could complete ADL with modified independent .  Patient will benefit from skilled intervention to decrease level of assist with basic self-care skills and increase independence with basic self-care skills prior to discharge home with care partner.  Anticipate patient will require intermittent supervision and follow up home health.  OT - End of Session Activity Tolerance: Tolerates 30+ min activity with multiple rests Endurance Deficit: Yes Endurance Deficit Description: required frequent rest breaks OT Assessment Rehab Potential (ACUTE ONLY): Good OT Patient demonstrates impairments in the following area(s): Balance;Safety;Cognition;Edema;Skin Integrity;Endurance;Motor;Pain OT Basic ADL's Functional Problem(s): Grooming;Bathing;Dressing;Toileting OT Transfers Functional Problem(s): Toilet;Tub/Shower OT Additional Impairment(s): None OT Plan OT Intensity: Minimum of 1-2 x/day, 45 to 90 minutes OT Frequency: 5 out of 7 days OT Duration/Estimated Length of Stay: 7-10 days OT Treatment/Interventions: Balance/vestibular training;Cognitive remediation/compensation;Discharge planning;DME/adaptive  equipment instruction;Disease mangement/prevention;Functional mobility training;Pain management;Patient/family education;Psychosocial support;Self Care/advanced ADL retraining;Skin care/wound managment;Splinting/orthotics;Therapeutic Activities;Therapeutic Exercise;UE/LE Strength taining/ROM;Wheelchair propulsion/positioning OT Self Feeding Anticipated Outcome(s): n/a OT Basic Self-Care Anticipated Outcome(s): supervision OT Toileting Anticipated Outcome(s): supervision OT Bathroom Transfers Anticipated Outcome(s): supervision OT Recommendation Recommendations for Other Services: Speech consult Patient destination: Home Follow Up Recommendations: Home health OT Equipment Recommended: Tub/shower bench;Other (comment) Equipment Details: drop arm BSC    Skilled Therapeutic Intervention 1:1 Ot eval initiated with OT purpose, goals and role reviewed with pt. This patient familiar to this therapist from previous admission. Focus on transfer training, sit to stands, standing balance and ADL retraining at sink level. Pt Perform stand pivot transfer with grab bar from w/c to [facility] over commode with A for clothing management. PT able to perform hygiene with lateral leans and extra time and setup for supplies. Pt able to propel self to sink to engaged in bathing and dressing. PT able to come into standing with min A with min A for balance while standing. Pt does required rest break throughout session due to fatigue. Pt left finishing brushing teeth at sink with NT.   OT Evaluation Precautions/Restrictions  Precautions Precautions: Fall Required Braces or Orthoses: Other Brace/Splint Restrictions Weight Bearing Restrictions: Yes RLE Weight Bearing: Non weight bearing General Chart Reviewed: Yes Family/Caregiver Present: No    Pain  no c/o pain in session  Home Living/Prior Jeff expects to be discharged to:: Unsure Living Arrangements: Other relatives Available  Help at Discharge: Family, Available PRN/intermittently Type of Home: House Home Access: Ramped entrance Home Layout: One level Bathroom Shower/Tub: Chiropodist: Standard  Lives With: Family Prior Function Level of Independence: Independent with basic ADLs, Independent with homemaking with ambulation, Independent with gait  Able to Take Stairs?: Yes Driving: Yes Vocation: Part  time employment 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 ADL ADL ADL Comments: see functional navigator Vision Baseline Vision/History: Legally blind;Wears glasses Wears Glasses: At all times Patient Visual Report: No change from baseline Vision Assessment?: Vision impaired- to be further tested in functional context Additional Comments: Pt reports vision better with contacts over glasses Perception  Perception: Within Functional Limits Praxis Praxis: Intact Cognition Overall Cognitive Status: Impaired/Different from baseline Arousal/Alertness: Awake/alert Orientation Level: Person;Place;Situation Person: Oriented Place: Oriented Situation: Oriented Year: 2019 Month: April Day of Week: Correct Memory: Appears intact Memory Impairment: Decreased short term memory Immediate Memory Recall: Sock;Blue;Bed Memory Recall: Sock;Blue;Bed Memory Recall Sock: Without Cue Memory Recall Blue: Without Cue Memory Recall Bed: Without Cue Attention: Selective Selective Attention: Appears intact Awareness: Appears intact Problem Solving: Impaired Problem Solving Impairment: Functional complex Executive Function: Writer: Impaired Organizing Impairment: Functional complex Safety/Judgment: Appears intact Sensation Sensation Light Touch: Impaired by gross assessment Proprioception: Appears Intact Additional Comments: reports occasional phantom pain and sensations  Coordination Gross Motor Movements are Fluid and Coordinated: Yes Fine Motor Movements are Fluid and  Coordinated: Yes Finger Nose Finger Test: Visual deficts limit smoothness Motor  Motor Motor: Other (comment) Motor - Skilled Clinical Observations: Generalized weakness Mobility  Bed Mobility Bed Mobility: Rolling Right;Rolling Left;Supine to Sit;Sit to Supine Rolling Right: 5: Supervision Rolling Right Details: Verbal cues for precautions/safety Rolling Left: 5: Supervision Rolling Left Details: Verbal cues for precautions/safety Supine to Sit Details: Verbal cues for safe use of DME/AE Transfers Transfers: Sit to Stand;Stand to Sit Sit to Stand: 4: Min assist Sit to Stand Details: Verbal cues for technique;Verbal cues for precautions/safety;Verbal cues for safe use of DME/AE Stand to Sit: 4: Min assist  Trunk/Postural Assessment  Cervical Assessment Cervical Assessment: Within Functional Limits Thoracic Assessment Thoracic Assessment: Within Functional Limits Lumbar Assessment Lumbar Assessment: (posterior pelvic tilt) Postural Control Postural Control: Within Functional Limits  Balance Balance Balance Assessed: Yes Static Sitting Balance Static Sitting - Level of Assistance: 6: Modified independent (Device/Increase time) Dynamic Sitting Balance Dynamic Sitting - Level of Assistance: 5: Stand by assistance Static Standing Balance Static Standing - Level of Assistance: 4: Min assist Dynamic Standing Balance Dynamic Standing - Level of Assistance: 3: Mod assist;4: Min assist Extremity/Trunk Assessment RUE Assessment RUE Assessment: Within Functional Limits LUE Assessment LUE Assessment: Within Functional Limits   See Function Navigator for Current Functional Status.   Refer to Care Plan for Long Term Goals  Recommendations for other services: None    Discharge Criteria: Patient will be discharged from OT if patient refuses treatment 3 consecutive times without medical reason, if treatment goals not met, if there is a change in medical status, if patient makes no  progress towards goals or if patient is discharged from hospital.  The above assessment, treatment plan, treatment alternatives and goals were discussed and mutually agreed upon: by patient  Nicoletta Ba 04/13/2017, 10:44 AM

## 2017-04-13 NOTE — Progress Notes (Signed)
PMR Admission Coordinator Pre-Admission Assessment  Patient: Trevor [name] is an 62 y.o., male MRN: [id number] DOB: Aug 11, 1955 Height: 6\' 4"  (193 cm) Weight: (!) 138.3 kg (304 lb 14.3 oz)                                                                                                                                                  Insurance Information HMO:     PPO:      PCP:      IPA:      80/20:      OTHER:  PRIMARY: Uninsured/Self-pay      Policy#:       Subscriber:  CM Name:       Phone#:      Fax#:  Pre-Cert#:       Employer:  Benefits:  Phone #:      Name:  Eff. Date:      Deduct:       Out of Pocket Max:       Life Max:  CIR:       SNF:  Outpatient:      Co-Pay:  Home Health:       Co-Pay:  DME:      Co-Pay:  Providers:   Medicaid Application Date:       Case Manager:  Disability Application Date:       Case Worker:   Emergency Contact Information         Contact Information    Name Relation Home Work [place], [place] Niece   [phone]   Trevor [name] Aunt [phone]       Current Medical History  Patient Admitting Diagnosis: Right BKA   History of Present Illness: Trevor Vermeer Murdockis a 62 y.o.right-handed malewith history of diabetes mellitus, hypertension,legally blind, CKD stage III.Per chartreview and patient, [place]. 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 Sharol Given. Hospital course pain managementand transition to oral Dilaudid as needed.Wound VAC appliedfor 1 week postoperatively.Plan stump shrinker per [employer] prosthetics.Blood cultures +1 of 2 for strep VIRIDANSbacteremia and advised IV  ceftezole and through 04/15/2017.Acute blood loss anemia 9.7 and monitored.SQ lovenox forDVT prophylaxis.Creatinine stable with history of CKD stage III latest creatinine 1.58from baseline 2.06Physical therapy evaluation completed with recommendations of physical medicine rehab consult.Patient was admitted for a comprehensive rehab program3/26/2019. Initially needing some encouragement to participate. During his rehabilitation course patient with noted anemia, melena with gastroenterology consulted and advised supportive care with PPI. He was transfused 2 units of packed red blood cells.On 04/08/2017 patient with altered mental status and more somnolent. Discharged to  acute care services 04/08/2017 for ongoing workup. MRI of the brain reviewed, unremarkable for acute intracranial process.CT Angioof the chest showed no pulmonary emboli. EEG negative. Carotid Dopplers with no ICA stenosis.Per neurology follow-up workup it was felt that altered mental status was related to his cephalosporin toxicity and Ancef was changed to Unasyn. Latest blood cultures were collected 04/08/2017 no growth to date. Mental status has continued to improve. Physical and occupational therapy has resumed and patient was re-admitted for comprehensive rehab program 04/12/17.    Past Medical History      Past Medical History:  Diagnosis Date  . Diabetes mellitus without complication ([place])   . Heart murmur   . Hyperlipidemia   . Hypertension   . Sleep apnea     Family History  family history is not on file.  Prior Rehab/Hospitalizations:  Has the patient had major surgery during 100 days prior to admission? No  Current Medications   Current Facility-Administered Medications:  .  acetaminophen (TYLENOL) tablet 650 mg, 650 mg, Oral, Q6H PRN, 650 mg at 04/11/17 0249 **OR** acetaminophen (TYLENOL) suppository 650 mg, 650 mg, Rectal, Q6H PRN, Jari Favre, Gorica, MD .  Ampicillin-Sulbactam (UNASYN)  3 g in sodium chloride 0.9 % 100 mL IVPB, 3 g, Intravenous, Q6H, Hoffman, Jessica Ratliff, DO, Last Rate: 200 mL/hr at 04/12/17 1322, 3 g at 04/12/17 1322 .  barrier cream (non-specified) 1 application, 1 application, Topical, BID PRN, Katherine Roan, MD .  enoxaparin (LOVENOX) injection 40 mg, 40 mg, Subcutaneous, Q24H, Svalina, Gorica, MD, 40 mg at 04/11/17 2034 .  insulin aspart (novoLOG) injection 0-15 Units, 0-15 Units, Subcutaneous, TID WC, Santos-Sanchez, Idalys, MD, 5 Units at 04/12/17 1323 .  insulin aspart (novoLOG) injection 10 Units, 10 Units, Subcutaneous, TID WC, Santos-Sanchez, Idalys, MD, 10 Units at 04/12/17 1325 .  insulin detemir (LEVEMIR) injection 40 Units, 40 Units, Subcutaneous, QHS, Alphonzo Grieve, MD, 40 Units at 04/11/17 2109 .  losartan (COZAAR) tablet 100 mg, 100 mg, Oral, Daily, Santos-Sanchez, Idalys, MD, 100 mg at 04/12/17 0910 .  NIFEdipine (PROCARDIA XL/ADALAT-CC) 24 hr tablet 90 mg, 90 mg, Oral, Daily, Katherine Roan, MD, 90 mg at 04/12/17 0910 .  pantoprazole (PROTONIX) EC tablet 40 mg, 40 mg, Oral, BID, Katherine Roan, MD, 40 mg at 04/12/17 1321 .  potassium chloride SA (K-DUR,KLOR-CON) CR tablet 40 mEq, 40 mEq, Oral, BID, Katherine Roan, MD, 40 mEq at 04/12/17 0908 .  sodium chloride flush (NS) 0.9 % injection 10-40 mL, 10-40 mL, Intracatheter, Q12H, Hoffman, Erik C, DO, 10 mL at 04/12/17 0911 .  [START ON 04/13/2017] sucralfate (CARAFATE) tablet 1 g, 1 g, Oral, Daily, Winfrey, Jenne Pane, MD  Patients Current Diet: Fall precautions Diet heart healthy/carb modified Room service appropriate? 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  He has a supportive niece and nephew in the area.    Home Assistive Devices / Equipment Home Assistive Devices/Equipment: None Home Equipment: None  Prior Device Use: Indicate devices/aids used by the patient prior to current illness, exacerbation or injury? None  Prior Functional Level Prior Function Level of Independence: Independent Comments: Independent prior to BKA and dc to CIR.  Self Care: Did the patient need help bathing, dressing, using the toilet or eating? Independent  Indoor Mobility: Did the patient need assistance with walking from room to room (with or without device)? Independent  Stairs: Did the patient need assistance with internal or external stairs (with or without device)? Independent  Functional Cognition: Did the patient need help planning regular tasks such as shopping or remembering to take medications? Independent  Current Functional Level Cognition  Arousal/Alertness: Awake/alert Overall Cognitive Status: Impaired/Different from baseline Difficult to assess due to: Impaired communication Current Attention Level: Alternating Orientation Level: Oriented to person, Oriented to place, Disoriented to time, Disoriented to situation Following Commands: Follows multi-step commands with increased time General Comments: Perseverating on multiple topics including "echo location" "lose your license" "please remember you are part of the miracle".  Inconsistantly answering simple questions. Easily distracted. Pt becoming irritated and agitated when therapist would silence IV or move foley bag. Squeezing therapist's hands aggressviely at times during irritation.     Extremity Assessment (includes Sensation/Coordination)  Upper Extremity Assessment: Overall WFL for tasks assessed  Lower Extremity Assessment: Defer to PT evaluation RLE Deficits / Details: s/p BKA with wound vac in place LLE Deficits /  Details: Able to lift LLE off bed against gravity without difficulty.     ADLs  Overall ADL's : Needs assistance/impaired Eating/Feeding: Minimal assistance, Bed level Eating/Feeding Details (indicate cue type and reason): Pt stating "I hate the food" when moving his table to bedside.  Grooming: Wash/dry face, Sitting, Supervision/safety Grooming Details (indicate cue type and reason): Pt washing his face at bed level demosntrating WFL for BUEs. Pt able to follow simple cue to "take wash clothe" and "wash your face" Upper Body Bathing: Moderate assistance, Bed level Lower Body Bathing: Maximal assistance, Bed level Upper Body Dressing : Moderate assistance, Bed level Lower Body Dressing: Maximal assistance, Bed level Lower Body Dressing Details (indicate cue type and reason): Pt able to elevate LLE for donning sock with Max A. General ADL Comments: encouraged pt to sit EOB.  Pt initially agreed then declined.  Re Explained role of OT ann importance of participation. Pt agreed to BUE Exercise.  Pt provided with blue theraband and instructed in use of shoulder flexion. Pt was agreable 1 st 2 exercises then became agitated when RN came in the room and he didnt invite him in.  OT session ended at this point    Mobility  Overal bed mobility: Needs Assistance Bed Mobility: Supine to Sit Supine to sit: Supervision Sit to supine: Min guard, HOB elevated General bed mobility comments: supervision for safety, required bedrail assist to pull to upright    Transfers  Overall transfer level: Needs assistance Equipment used: None Transfers: Lateral/Scoot Transfers  Lateral/Scoot Transfers: Min assist General transfer comment: min A for 3 "bump" lateral scoot transfer to recliner    Ambulation / Gait / Stairs / Wheelchair Mobility  Ambulation/Gait General Gait Details: unable    Posture / Balance Balance Overall balance assessment: Needs assistance Sitting-balance support: Feet  supported, No upper extremity supported Sitting balance-Leahy Scale: Fair    Special needs/care consideration BiPAP/CPAP: No CPM: No Continuous Drip IV: No Dialysis: No         Life Vest: No Oxygen: No Special Bed: No Trach Size: No Wound Vac (area): Yes      Location: Right BKA stump Skin: Dry monitoring left foot, may require podiatry follow up as an outpatient for management of toe nails  Bowel mgmt: Intermittent incontinence, last BM 04/11/17 Bladder mgmt: Intermittent incontinence  Diabetic mgmt: Yes PTA with occasional CBG checks, oral medication and Levamir; [id number]     Previous Home Environment Living Arrangements: Other relatives  Lives With: Family Available Help at Discharge: Family, Available PRN/intermittently Type of Home: House Home Layout: One level Home Access: Ramped entrance Bathroom Shower/Tub: Tub/shower unit, Architectural technologist: Standard  Discharge Living Setting Plans for Discharge Living Setting: Patient's home, House Type of Home at Discharge: House Discharge Home Layout: One level Discharge Home Access: [place] entrance Discharge Bathroom Shower/Tub: [place] unit, Curtain Discharge Bathroom Toilet: Standard Discharge Bathroom Accessibility: No Does the patient have any problems obtaining your medications?: Yes (Describe)(uninsured with application pending; has an attroney as well)  Social/Family/Support Systems Patient Roles: Other (Comment)(uncle) Contact Information: Niece, Dixon Boos [phone] Anticipated Caregiver: Niece and fellow church memebers as needed  Anticipated Caregiver's Contact Information: see above Ability/Limitations of Caregiver: Niece has 3 children and works part time  Careers adviser: Intermittent Discharge Plan Discussed with Primary Caregiver: Yes Is Caregiver In Agreement with Plan?: Yes Does Caregiver/Family have Issues with Lodging/Transportation while Pt is  in Rehab?: No  Goals/Additional Needs Patient/Family Goal for Rehab: PT/OT: Mod I  Expected length of stay: 5-7 days  Cultural Considerations: Christian with observance on Saturday with no therapy  Dietary Needs: Carb. Mod. diet restrictions  Equipment Needs: TBD Additional Information: Wound vac due to be removed Pt/Family Agrees to Admission and willing to participate: Yes Program Orientation Provided & Reviewed with Pt/Caregiver Including Roles  & Responsibilities: Yes Additional Information Needs: Pt. with attorney for SSI/SSD  Information Needs to be Provided By: FYI for CSW and niece has been handeling   Barriers to Discharge: Home environment access/layout, Medication compliance, Decreased caregiver support  Decrease burden of Care through IP rehab admission: No  Possible need for SNF placement upon discharge: No  Patient Condition: This patient's medical and functional status has changed since the consult dated: 04/04/17 in which the Rehabilitation Physician determined and documented that the patient's condition is appropriate for intensive rehabilitative care in an inpatient rehabilitation facility. See "History of Present Illness" (above) for medical update. Functional changes are: Min A scoot transfer. Patient's medical and functional status update has been discussed with the Rehabilitation physician and patient remains appropriate for inpatient rehabilitation. Will admit to inpatient rehab today.  Preadmission Screen Completed By:  Gunnar Fusi, 04/12/2017 4:33 PM ______________________________________________________________________   Discussed status with Dr. Posey Pronto on 04/12/17 at 1645 and received telephone approval for admission today.  Admission Coordinator:  Gunnar Fusi, time 1645/Date 04/12/17             Cosigned by: Jamse Arn, MD at 04/12/2017 4:52 PM  Revision History

## 2017-04-13 NOTE — Progress Notes (Signed)
Physical Therapy Session Note  Patient Details  Name: Trevor [name] MRN: [id number] Date of Birth: 1955/02/21  Today's Date: 04/13/2017 PT Individual Time: 1415-1440 PT Individual Time Calculation (min): 25 min   Short Term Goals: Week 1:  PT Short Term Goal 1 (Week 1): STG=LTG due to ELOS   Skilled Therapeutic Interventions/Progress Updates:    Pt requests to stay in room this session. Focused on supine and sidelying therex for functional strengthening including SLR, hip abduction (supine and sidelying) and hip extension x 10 reps each x 2 sets with cues for technique. Pt declines taking off limb guard for knee ROM at this time as pt thought he needed to wear it all the time. Education provided on importance of extension but also need for ROM to occur at knee during therapy sessions for functional strengthening and in preparation for prosthesis. Educated about general prosthetic process. Pt verbalized understanding. Left in supine with all needs in reach.   Therapy Documentation Precautions:  Precautions Precautions: Fall Required Braces or Orthoses: Other Brace/Splint Restrictions Weight Bearing Restrictions: Yes RLE Weight Bearing: Non weight bearing Pain:  Reports pain is ok at the moment.    See Function Navigator for Current Functional Status.   Therapy/Group: Individual Therapy  Canary Brim Ivory Broad, PT, DPT  04/13/2017, 2:44 PM

## 2017-04-13 NOTE — Evaluation (Signed)
Physical Therapy Assessment and Plan  Patient Details  Name: Trevor [name] MRN: [id number] Date of Birth: 27-Sep-1955  PT Diagnosis: Difficulty walking, Edema, Impaired cognition, Muscle weakness and Pain in LLE Rehab Potential:   good  ELOS: 7-10days    Today's Date: 04/13/2017 PT Individual Time: 0802-0900 AND 1525-1605 PT Individual Time Calculation (min): 58 min  AND 40 min  Problem List:  Patient Active Problem List   Diagnosis Date Noted  . Unilateral complete BKA, right, sequela ([place])   . Benign essential HTN   . Hypoalbuminemia due to protein-calorie malnutrition ([place])   . S/P below knee amputation, right ([place]) 04/12/2017  . Acute blood loss anemia   . Other encephalopathy 04/08/2017  . Encephalopathy 04/08/2017  . Altered mental status 04/08/2017  . Labile blood pressure   . Labile blood glucose   . Drug induced constipation   . Stage 3 chronic kidney disease ([place])   . Bacteremia   . S/P unilateral BKA (below knee amputation), right ([place]) 04/04/2017  . PAD (peripheral artery disease) ([place])   . Type 2 diabetes mellitus with right diabetic foot ulcer ([place])   . Post-operative pain   . Legally blind   . Upper GI bleed   . Streptococcal bacteremia 04/01/2017  . Hypokalemia 03/30/2017  . Uncontrolled type 2 diabetes mellitus with hyperglycemia, with long-term current use of insulin ([place]) 03/30/2017  . Type 2 diabetes mellitus with peripheral neuropathy ([place]) 03/30/2017  . AKI (acute kidney injury) ([place]) 03/30/2017  . CKD stage 3 due to type 2 diabetes mellitus ([place]) 03/30/2017  . Sepsis ([place]) 03/30/2017  . Heart murmur 11/22/2016  . Hyperlipidemia associated with type 2 diabetes mellitus ([place]) 11/22/2016  . Obstructive sleep apnea syndrome 11/22/2016  . Essential hypertension 12/17/2012    Past Medical History:  Past Medical History:  Diagnosis Date  . Diabetes mellitus without complication ([place])   . Heart murmur   . Hyperlipidemia   . Hypertension   . Sleep  apnea    Past Surgical History:  Past Surgical History:  Procedure Laterality Date  . AMPUTATION Right 03/31/2017   Procedure: RIGHT FOOT 1ST AND 2ND RAY AMPUTATION;  Surgeon: Newt Minion, MD;  Location: [facility];  Service: Orthopedics;  Laterality: Right;  . AMPUTATION Right 04/01/2017   Procedure: AMPUTATION BELOW KNEE;  Surgeon: Newt Minion, MD;  Location: [facility];  Service: Orthopedics;  Laterality: Right;  . CORNEAL TRANSPLANT      Assessment & Plan Clinical Impression: Patient is a 62 y.o.right-handed malewith history of diabetes mellitus, hypertension,legally blind, CKD stage III.Per chartreview and patient, patientlives with nephew in [place]. Nephew attends school and works.Family in area work.Presented 03/29/2017 with findings of osteomyelitis abscess of the right foot. 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 Sharol Given. Hospital course pain managementand transition to oral Dilaudid as needed.Wound VAC appliedfor 1 week postoperatively.Plan stump shrinker per [employer] prosthetics.Blood cultures +1 of 2 for strep VIRIDANSbacteremia and advised IV ceftezole and through 04/15/2017.Acute blood loss anemia monitored. Creatinine stable with history of CKD stage III, baseline 2.06Physical therapy evaluation completed with recommendations of physical medicine rehab consult.Patient was admitted for a comprehensive rehab program 04/04/2017.  Initially needing some encouragement to participate.    During his rehabilitation course patient with noted anemia, melena with gastroenterology consulted and advised supportive care with PPI.  He was transfused 2 units of packed red blood cells.  On  04/08/2017 patient with altered mental status and more somnolent.  Discharged to acute care services 04/08/2017 for ongoing workup. MRI of the brain reviewed,  unremarkable for acute intracranial process. CT Angio of the chest showed no pulmonary emboli.  EEG negative.  Carotid Dopplers with no ICA stenosis.  Initial plan for lumbar puncture but discontinued.  Echocardiogram ?pending. Per neurology follow-up workup it was felt that altered mental status was related to his cephalosporin toxicity and Ancef was changed to Unasyn.  Latest blood cultures were collected 04/08/2017 no growth to date.  Mental status has continued to improve.     Patient transferred to CIR on 04/12/2017 .   Patient currently requires min with mobility secondary to muscle weakness, decreased cardiorespiratoy endurance, decreased visual acuity, decreased problem solving and decreased safety awareness and decreased standing balance, decreased balance strategies and difficulty maintaining precautions.  Prior to hospitalization, patient was independent  with mobility and lived with   in a House home.  Home access is  Ramped entrance.  Patient will benefit from skilled PT intervention to maximize safe functional mobility, minimize fall risk and decrease caregiver burden for planned discharge home with intermittent assist.  Anticipate patient will benefit from follow up [facility] at discharge.  PT - End of Session Activity Tolerance: Tolerates 10 - 20 min activity with multiple rests Endurance Deficit: Yes PT Assessment PT Barriers to Discharge: Inaccessible home environment;Decreased caregiver support;Home environment access/layout;IV antibiotics PT Patient demonstrates impairments in the following area(s): Balance;Edema;Endurance;Motor;Pain;Safety;Sensory;Skin Integrity PT Transfers Functional Problem(s): Bed Mobility;Bed to Chair;Car;Furniture PT Locomotion Functional Problem(s): Ambulation;Stairs PT Plan PT Intensity: Minimum of 1-2 x/day ,45 to 90 minutes PT Frequency: 5 out of 7 days PT Duration Estimated Length of Stay: 7-10days  PT Treatment/Interventions: Ambulation/gait  training;Discharge planning;Balance/vestibular training;Cognitive remediation/compensation;Disease management/prevention;Community reintegration;DME/adaptive equipment instruction;Functional electrical stimulation;Functional mobility training;Pain management;Patient/family education;Neuromuscular re-education;Stair training;Psychosocial support;Therapeutic Activities;Skin care/wound management;Splinting/orthotics;Therapeutic Exercise;Visual/perceptual remediation/compensation;UE/LE Coordination activities;UE/LE Strength taining/ROM;Wheelchair propulsion/positioning PT Transfers Anticipated Outcome(s): Supervision assist with LRAD  PT Locomotion Anticipated Outcome(s): Mod I in [place]. supervision assist Ambulatory for short distances.  PT Recommendation Recommendations for Other Services: Therapeutic Recreation consult Therapeutic Recreation Interventions: Kitchen group Follow Up Recommendations: Home health PT Patient destination: Home Equipment Recommended: Rolling walker with 5" wheels;Wheelchair cushion (measurements);Wheelchair (measurements)  Skilled Therapeutic Intervention Pt received supine in bed and agreeable to PT. Supine>sit transfer with supervision assist and min cues for safety and cues from PT for safety.  PT instructed patient in PT Evaluation and initiated treatment intervention; see below for details. PT educated patient in [place], rehab potential, rehab goals, and discharge recommendations.  Patient returned to room and left sitting in [facility] with call bell in reach and all needs met.     Session 2.   Pt received supine in bed and agreeable to PT. Supine>sit transfer with supervision assist and min cues for safety.   PT instructed pt in stand pivot transfer to [facility] with min assist. Min cues for AD management.   WC mobility training instructed by PT x 162f with supervision assist, min cues from PT to improve safety in turns and awareness of obstacles when distracted.   BUE ergometer  level 4.5, 3 min forward, 3 min backward. 2 min rest break between bouts. Pt rates fatgiue 15/200 on [employer] RPE upon completion.   Pt returned to room and performed squat pivot transfer to bed with min assist and heavy use of bed rails.  Sit>supine completed with supervision assist for safety, and pt left supine in bed with call bell  in reach and all needs met.          PT Evaluation Precautions/Restrictions   fall. NWB LLE.  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 Vision/Perception  Vision - Assessment Additional Comments: Pt reports vision better with contacts over glasses Perception Perception: Within Functional Limits Praxis Praxis: Intact  Cognition Memory: Appears intact Awareness: Appears intact Problem Solving: Impaired Problem Solving Impairment: Functional complex Executive Function: Writer: Impaired Organizing Impairment: Functional complex Safety/Judgment: Appears intact Sensation Sensation Light Touch: Impaired by gross assessment(mild distal neuropathy in the LLE and mild numbness near incisiion site of the RLE) Proprioception: Appears Intact Additional Comments: reports occasional phantom pain and sensations  Coordination Gross Motor Movements are Fluid and Coordinated: Yes Fine Motor Movements are Fluid and Coordinated: Yes Finger Nose Finger Test: Visual deficts limit smoothness Motor  Motor Motor: Other (comment) Motor - Skilled Clinical Observations: Generalized  weakness  Mobility Bed Mobility Bed Mobility: Rolling Right;Rolling Left;Supine to Sit;Sit to Supine Rolling Right: 5: Supervision Rolling Right Details: Verbal cues for precautions/safety Rolling Left: 5: Supervision Rolling Left Details: Verbal cues for precautions/safety Supine to Sit Details: Verbal cues for safe use of DME/AE Transfers Transfers: Yes Sit to Stand: 4: Min assist Sit to Stand Details: Verbal cues for technique;Verbal cues for precautions/safety;Verbal cues for safe use of DME/AE Stand to Sit: 4: Min assist Stand Pivot Transfers: 4: Min assist Stand Pivot Transfer Details: Verbal cues for technique;Verbal cues for precautions/safety;Verbal cues for safe use of DME/AE Locomotion  Ambulation Ambulation: Yes Ambulation/Gait Assistance: 4: Min assist Assistive device: Rolling walker Ambulation/Gait Assistance Details: Verbal cues for precautions/safety;Verbal cues for safe use of DME/AE;Verbal cues for technique;Verbal cues for gait pattern Gait Gait: Yes Gait Pattern: Impaired Gait Pattern: Step-to pattern;Antalgic(swing-to with RW) Stairs / Additional Locomotion Stairs: No Architect: Yes Wheelchair Assistance: 5: Careers information officer: Both upper extremities Wheelchair Parts Management: Supervision/cueing Distance: 268f   Trunk/Postural Assessment  Cervical Assessment Cervical Assessment: Within FScientist, physiologicalAssessment: Within Functional Limits Lumbar Assessment Lumbar Assessment: Exceptions to WFL(decreased lorosis) Postural Control Postural Control: Within Functional Limits  Balance Balance Balance Assessed: Yes Static Sitting Balance Static Sitting - Level of Assistance: 6: Modified independent (Device/Increase time) Dynamic Sitting Balance Dynamic Sitting - Level of Assistance: 5: Stand by assistance Static Standing Balance Static Standing - Level of Assistance: 4: Min  assist Dynamic Standing Balance Dynamic Standing - Level of Assistance: 3: Mod assist;4: Min assist Extremity Assessment      RLE Assessment RLE Assessment: Exceptions to [facility] Strength RLE Overall Strength Comments: Grossly 4/5 in hip, unable to assess Knee due to pain and limb guard LLE Assessment LLE Assessment: Within Functional Limits   See Function Navigator for Current Functional Status.   Refer to Care Plan for Long Term Goals  Recommendations for other services: Therapeutic Recreation  Kitchen group  Discharge Criteria: Patient will be discharged from PT if patient refuses treatment 3 consecutive times without medical reason, if treatment goals not met, if there is a change in medical status, if patient makes no progress towards goals or if patient is discharged from hospital.  The above assessment, treatment plan, treatment alternatives and goals were discussed and mutually agreed upon: by patient  Lorie Phenix 04/13/2017, 9:00 AM

## 2017-04-13 NOTE — Progress Notes (Signed)
Physical Medicine and Rehabilitation Consult Reason for Consult: Decreased functional mobility Referring Physician: Internal medicine   HPI: Trevor [name] is a 62 y.o. right-handed male with history of diabetes mellitus, hypertension,legally blind. Per chart review and patient, patient lives with nephew in [place]. Nephew attends school and works. Family in area work. Presented 03/29/2017 with findings of osteomyelitis abscess of the right foot.  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 Sharol Given.  Hospital course pain management.  Acute blood loss anemia 9.7 and monitored.SQ lovenox for DVT prophylaxis.  Physical therapy evaluation completed with recommendations of physical medicine rehab consult.  Review of Systems  Constitutional: Negative for chills and fever.  HENT: Negative for hearing loss.   Eyes: Negative for blurred vision and double vision.       Legally blind  Respiratory: Negative for cough and shortness of breath.   Cardiovascular: Negative for chest pain and palpitations.  Gastrointestinal: Positive for constipation. Negative for nausea and vomiting.  Genitourinary: Negative for dysuria, flank pain and hematuria.  Musculoskeletal: Positive for joint pain and myalgias.  All other systems reviewed and are negative.      Past Medical History:  Diagnosis Date  . Diabetes mellitus without complication ([place])   . Heart murmur   . Hyperlipidemia   . Hypertension   . Sleep apnea         Past Surgical History:  Procedure Laterality Date  . AMPUTATION Right 03/31/2017   Procedure: RIGHT FOOT 1ST AND 2ND RAY AMPUTATION;  Surgeon: Newt Minion, MD;  Location: [facility];  Service: Orthopedics;  Laterality: Right;  . AMPUTATION Right 04/01/2017   Procedure: AMPUTATION BELOW KNEE;  Surgeon: Newt Minion, MD;  Location: [facility];   Service: Orthopedics;  Laterality: Right;  . CORNEAL TRANSPLANT     No pertinent family history of amputation. Social History:  reports that he has never smoked. He has never used smokeless tobacco. He reports that he does not drink alcohol or use drugs. Allergies:      Allergies  Allergen Reactions  . Enalapril Cough   Medications Prior to Admission  Medication Sig Dispense Refill  . atenolol (TENORMIN) 100 MG tablet Take 100 mg by mouth daily.    . cyclobenzaprine (FLEXERIL) 10 MG tablet Take 10 mg by mouth 3 (three) times daily as needed for muscle spasms.    . fluticasone (FLONASE) 50 MCG/ACT nasal spray Place 2 sprays into both nostrils daily.    . furosemide (LASIX) 40 MG tablet Take 40 mg by mouth as needed.    [name] glipiZIDE (GLUCOTROL) 10 MG tablet Take 10 mg by mouth 2 (two) times daily.    . insulin detemir (LEVEMIR) 100 UNIT/ML injection Inject 60 Units into the skin 2 (two) times daily.    [name] losartan (COZAAR) 25 MG tablet Take 100 mg by mouth daily.    . meloxicam (MOBIC) 15 MG tablet Take 15 mg by mouth daily.    . metFORMIN (GLUCOPHAGE) 1000 MG tablet Take 1,000 mg by mouth 2 (two) times daily.    [name] NIFEdipine (PROCARDIA XL/ADALAT-CC) 90 MG 24 hr tablet Take 90 mg by mouth daily.    . potassium chloride SA (K-DUR,KLOR-CON) 20 MEQ tablet Take 1 tablet (20 mEq total) 2 (two) times daily by mouth. 60 tablet 3  . silver sulfADIAZINE (SILVADENE) 1 % cream Apply 1 application topically daily.    [name]  glucose blood (TRUE METRIX BLOOD GLUCOSE TEST) test strip Use as instructed (Patient not taking: Reported on 03/29/2017) 100 each 12    Home: [place] expects to be discharged to:: Private residence Living Arrangements: Other relatives Available Help at Discharge: Family, Available PRN/intermittently Type of Home: House Home Access: Ramped entrance [place]: One level Bathroom Shower/Tub: Chiropodist:  Standard Home Equipment: None  Functional History: Prior Function Level of Independence: Independent Functional Status:  Mobility: Bed Mobility Overal bed mobility: Needs Assistance Bed Mobility: Supine to Sit, Sit to Supine Supine to sit: Min guard, HOB elevated Sit to supine: Min guard, HOB elevated General bed mobility comments: +rail, increased time and effort, verbal cues for sequencing  Transfers General transfer comment: unable to progress beyond EOB due to pain/pt refusal Ambulation/Gait General Gait Details: unable  ADL:  Cognition: Cognition Overall Cognitive Status: Within Functional Limits for tasks assessed Orientation Level: Oriented X4 Cognition Arousal/Alertness: Awake/alert Behavior During Therapy: WFL for tasks assessed/performed Overall Cognitive Status: Within Functional Limits for tasks assessed  Blood pressure (!) 154/100, pulse 100, temperature 98.1 F (36.7 C), temperature source Oral, resp. rate 16, height 6\' 4"  (1.93 m), weight 134.7 kg (296 lb 15.4 oz), SpO2 98 %. Physical Exam  Vitals reviewed. Constitutional: He is oriented to person, place, and time. He appears well-developed.  Obese  HENT:  Head: Normocephalic and atraumatic.  Eyes: EOM are normal.  Pupils sluggish to light  Neck: Normal range of motion. Neck supple. No thyromegaly present.  Cardiovascular: Regular rhythm and normal heart sounds.  +Tachycardia  Respiratory: Effort normal and breath sounds normal. No respiratory distress.  GI: Soft. Bowel sounds are normal. He exhibits no distension.  Musculoskeletal:  Right stump TTP  Neurological: He is alert and oriented to person, place, and time.  Motor: B/l UE, LLE 5/5 proximal to distal RLE: HF 4/5 (pain inhibition)  Skin:  Right AKA with VAC and appropriately tender  Psychiatric: His affect is blunt.         Results for orders placed or performed during the hospital encounter of 03/29/17 (from the past 24 hour(s))   Glucose, capillary     Status: Abnormal   Collection Time: 04/02/17 10:57 AM  Result Value Ref Range   Glucose-Capillary 247 (H) 65 - 99 mg/dL  Glucose, capillary     Status: Abnormal   Collection Time: 04/02/17  4:54 PM  Result Value Ref Range   Glucose-Capillary 276 (H) 65 - 99 mg/dL  Glucose, capillary     Status: Abnormal   Collection Time: 04/03/17 12:18 AM  Result Value Ref Range   Glucose-Capillary 196 (H) 65 - 99 mg/dL  Glucose, capillary     Status: Abnormal   Collection Time: 04/03/17  6:38 AM  Result Value Ref Range   Glucose-Capillary 213 (H) 65 - 99 mg/dL  Basic metabolic panel     Status: Abnormal   Collection Time: 04/03/17  6:40 AM  Result Value Ref Range   Sodium 136 135 - 145 mmol/L   Potassium 3.6 3.5 - 5.1 mmol/L   Chloride 105 101 - 111 mmol/L   CO2 23 22 - 32 mmol/L   Glucose, Bld 220 (H) 65 - 99 mg/dL   BUN 19 6 - 20 mg/dL   Creatinine, Ser 1.30 (H) 0.61 - 1.24 mg/dL   Calcium 8.1 (L) 8.9 - 10.3 mg/dL   GFR calc non Af Amer 58 (L) >60 mL/min   GFR calc Af Amer >60 >60 mL/min  Anion gap 8 5 - 15   No results found.  Assessment/Plan: Diagnosis: Right BKA Labs independently reviewed.  Records reviewed and summated above. Clean amputation daily with soap and water Monitor incision site for signs of infection or impending skin breakdown. Staples to remain in place for 3-4 weeks Stump shrinker, for edema control  Scar mobilization massaging to prevent soft tissue adherence Stump protector during therapies Prevent flexion contractures by implementing the following:              Encourage prone lying for 20-30 mins per day BID to avoid hip flexion       Contractures if medically appropriate;             Avoid pillow under knees when patient is lying in bed in order to prevent both        knee and hip flexion contractures;             Avoid prolonged sitting Post surgical pain control with oral medication Phantom limb pain control  with physical modalities including desensitization techniques (gentle self massage to the residual stump,hot packs if sensation intact, [place]) and mirror therapy, TENS. If ineffective, consider pharmacological treatment for neuropathic pain (e.g gabapentin, pregabalin, amytriptalyine, duloxetine).  When using wheelchair, patient should have knee on amputated side fully extended with board under the seat cushion. Avoid injury to contralateral side  1. Does the need for close, 24 hr/day medical supervision in concert with the patient's rehab needs make it unreasonable for this patient to be served in a less intensive setting? Potentially  2. Co-Morbidities requiring supervision/potential complications: diabetes mellitus (Monitor in accordance with exercise and adjust meds as necessary), HTN (monitor and provide prns in accordance with increased physical exertion and pain), legally blind, post-op pain management (Biofeedback training with therapies to help reduce reliance on opiate pain medications, particularly IV dilaudid, monitor pain control during therapies, and sedation at rest and titrate to maximum efficacy to ensure participation and gains in therapies), Acute blood loss anemia (transfuse if necessary to ensure appropriate perfusion for increased activity tolerance), Tachycardia (monitor in accordance with pain and increasing, CKD (avoid nephrotoxic meds) 3. Due to safety, skin/wound care, disease management, pain management and patient education, does the patient require 24 hr/day rehab nursing? Potentially 4. Does the patient require coordinated care of a physician, rehab nurse, PT (1-2 hrs/day, 5 days/week) and OT (1-2 hrs/day, 5 days/week) to address physical and functional deficits in the context of the above medical diagnosis(es)? Potentially Addressing deficits in the following areas: balance, endurance, locomotion, strength, bathing, dressing, toileting and psychosocial support 5. Can the  patient actively participate in an intensive therapy program of at least 3 hrs of therapy per day at least 5 days per week? Yes 6. The potential for patient to make measurable gains while on inpatient rehab is good and fair 7. Anticipated functional outcomes upon discharge from inpatient rehab are modified independent  with PT, modified independent with OT, n/a with SLP. 8. Estimated rehab length of stay to reach the above functional goals is: 3-5 days. 9. Anticipated D/C setting: Home 10. Anticipated post D/C treatments: HH therapy and Home excercise program 11. Overall Rehab/Functional Prognosis: good  RECOMMENDATIONS: This patient's condition is appropriate for continued rehabilitative care in the following setting: Anticipate pt will not require CIR once pain better controlled.  Recommend home with HH. Further, pt would still like to consider if he wants CIR, but not today, as he prefers no one else speak to  him today regarding rehab.   Patient has agreed to participate in recommended program. Potentially Note that insurance prior authorization may be required for reimbursement for recommended care.  Comment: Rehab Admissions Coordinator to follow up tomorrow.  Delice Lesch, MD, ABPMR Lavon Paganini Angiulli, PA-C 04/03/2017          Revision History                        Routing History

## 2017-04-13 NOTE — Progress Notes (Signed)
[facility] Infusion Coordinator will continue to follow for possible home IV ABX and [facility] services based on how pt progresses while in CIR.  If patient discharges after hours, please call [phone].   Larry Sierras 04/13/2017, 8:11 AM

## 2017-04-13 NOTE — Progress Notes (Signed)
[place] PHYSICAL MEDICINE & REHABILITATION     PROGRESS NOTE  Subjective/Complaints:  Patient seen lying in bed this morning. He states he slept well overnight. He states he is ready to resume therapies. He is happy about having his wound VAC DC'd.  ROS: denies CP, SOB, nausea, vomiting, diarrhea.  Objective: Vital Signs: Blood pressure 135/75, pulse 100, temperature 98.5 F (36.9 C), temperature source Oral, height 6\' 4"  (1.93 m), weight 130 kg (286 lb 9.6 oz), SpO2 98 %. No results found. Recent Labs    04/12/17 1903 04/13/17 0655  WBC 8.4 6.0  HGB 8.7* 7.9*  HCT 27.9* 24.4*  PLT 471* 397   Recent Labs    04/12/17 0403 04/12/17 1903 04/13/17 0655  NA 140  --  140  K 3.0*  --  3.2*  CL 105  --  107  GLUCOSE 106*  --  89  BUN 11  --  12  CREATININE 1.09 1.32* 1.13  CALCIUM 8.3*  --  8.1*   CBG (last 3)  Recent Labs    04/12/17 1108 04/12/17 1630 04/13/17 0657  GLUCAP 230* 217* 93    Wt Readings from Last 3 Encounters:  04/12/17 130 kg (286 lb 9.6 oz)  04/08/17 (!) 138.3 kg (304 lb 14.3 oz)  04/08/17 (!) 137.2 kg (302 lb 8 oz)    Physical Exam:  BP 135/75 (BP Location: Left Arm)   Pulse 100   Temp 98.5 F (36.9 C) (Oral)   Ht 6\' 4"  (1.93 m)   Wt 130 kg (286 lb 9.6 oz)   SpO2 98%   BMI 34.89 kg/m  Constitutional: He appears well-developed. Obese  HENT: Normocephalic and atraumatic.  Eyes: EOM are normal. No discharge.  Cardiovascular: Normal rate, regular rhythm and no JVD. Respiratory: Effort normal and breath sounds normal.  GI: Bowel sounds are normal. He exhibits no distension.  Musculoskeletal: TTP right stump  Neurological: He is alert and oriented.  Followed simple commands. Motor: B/l UE, LLE 5/5 proximal to distal LLE: 5/5 proximal to distal Right HF: 4+/5  Skin: Skin is warm and dry.  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 His speech is delayed. He is slowed, improving   Assessment/Plan: 1.  Functional deficits secondary to right BKA which require 3+ hours per day of interdisciplinary therapy in a comprehensive inpatient rehab setting. Physiatrist is providing close team supervision and 24 hour management of active medical problems listed below. Physiatrist and rehab team continue to assess barriers to discharge/monitor patient progress toward functional and medical goals.  Function:  Bathing Bathing position      Bathing parts      Bathing assist        Upper Body Dressing/Undressing Upper body dressing                    Upper body assist        Lower Body Dressing/Undressing Lower body dressing                                  Lower body assist        Toileting Toileting          Toileting assist     Transfers Chair/bed Clinical biochemist  Cognition Comprehension Comprehension assist level: Follows basic conversation/direction with extra time/assistive device  Expression Expression assist level: Expresses basic needs/ideas: With extra time/assistive device  Social Interaction Social Interaction assist level: Interacts appropriately 75 - 89% of the time - Needs redirection for appropriate language or to initiate interaction.  Problem Solving Problem solving assist level: Solves basic 25 - 49% of the time - needs direction more than half the time to initiate, plan or complete simple activities  Memory Memory assist level: Recognizes or recalls 25 - 49% of the time/requires cueing 50 - 75% of the time    Medical Problem List and Plan: 1.  Decreased functional mobility secondary to right transtibial amputation 7/67/3419 complicated by acute encephalopathy related to Cephalosporin toxicity.     Begin CIR 2.  DVT Prophylaxis/Anticoagulation: Subcutaneous Lovenox resumed for DVT prophylaxis 3. Pain Management: Tylenol as needed 4. Mood: Provide emotional support 5.  Neuropsych: This patient is capable of making decisions on his own behalf. 6. Skin/Wound Care: Routine skin checks 7. Fluids/Electrolytes/Nutrition: Routine I/O's 8.  ID.  Viridans strep bacteremia.  Presently on Unasyn after Cephalosporin toxicity.     Will discuss duration of antibiotic therapy 9.  Anemia/melena.  Conservative care by GI.  Continue Protonix twice daily   Hemoglobin 7.9 on 4/4, will order repeat 10.  Hypertension.  Cozaar 100 mg daily, Procardia 90 mg daily.  Monitor with increased mobility   Monitor with increased mobility 11.  Diabetes mellitus with peripheral neuropathy.  Hemoglobin A1c 12.5.  Levemir 40 units nightly, NovoLog 10 units 3 times daily.  Check blood sugars before meals and at bedtime.   Monitor with increased mobility 12.  Constipation.  Laxative assistance 13.  Legally blind. 14.  CKD stage III.  Follow-up chemistries    Creatinine 1.13 on 4/4 15. Hypokalemia   Potassium 3.2 on 4/4  Will add daily supplement 16. Hypoalbuminemia  Supplement initiated on 4/4  LOS (Days) 1 A FACE TO FACE EVALUATION WAS PERFORMED  Ankit Lorie Phenix 04/13/2017 8:15 AM

## 2017-04-14 ENCOUNTER — Inpatient Hospital Stay (HOSPITAL_COMMUNITY): Payer: Self-pay | Admitting: Physical Therapy

## 2017-04-14 ENCOUNTER — Telehealth: Payer: Self-pay | Admitting: Pharmacy Technician

## 2017-04-14 ENCOUNTER — Inpatient Hospital Stay (HOSPITAL_COMMUNITY): Payer: Self-pay | Admitting: Occupational Therapy

## 2017-04-14 ENCOUNTER — Inpatient Hospital Stay (HOSPITAL_COMMUNITY): Payer: Self-pay

## 2017-04-14 DIAGNOSIS — M19071 Primary osteoarthritis, right ankle and foot: Secondary | ICD-10-CM

## 2017-04-14 DIAGNOSIS — R509 Fever, unspecified: Secondary | ICD-10-CM

## 2017-04-14 LAB — CULTURE, BLOOD (ROUTINE X 2)
CULTURE: NO GROWTH
Culture: NO GROWTH
SPECIAL REQUESTS: ADEQUATE
Special Requests: ADEQUATE

## 2017-04-14 LAB — GLUCOSE, CAPILLARY
GLUCOSE-CAPILLARY: 121 mg/dL — AB (ref 65–99)
GLUCOSE-CAPILLARY: 121 mg/dL — AB (ref 65–99)
Glucose-Capillary: 101 mg/dL — ABNORMAL HIGH (ref 65–99)
Glucose-Capillary: 90 mg/dL (ref 65–99)

## 2017-04-14 MED ORDER — TRAMADOL HCL 50 MG PO TABS
25.0000 mg | ORAL_TABLET | Freq: Three times a day (TID) | ORAL | Status: DC | PRN
  Administered 2017-04-17 – 2017-04-24 (×2): 50 mg via ORAL
  Filled 2017-04-14 (×4): qty 1

## 2017-04-14 NOTE — Progress Notes (Signed)
Occupational Therapy Session Note  Patient Details  Name: DELFIN SQUILLACE MRN: [id number] Date of Birth: 1955-02-19  Today's Date: 04/14/2017 OT Individual Time: 1105-1200 OT Individual Time Calculation (min): 55 min    Short Term Goals: Week 1:  OT Short Term Goal 1 (Week 1): LTG-STG  Skilled Therapeutic Interventions/Progress Updates:    Treatment session with focus on sit <> stand and dynamic standing balance during self-care tasks.  Pt received upright in w/c reporting no pain.  Pt reports he still has difficulty with word finding and feels like he is still "slower" than he used to be with his thinking.  Per recommendation from Acute SLP and pt report, requested speech eval from PA who was in agreement.  Engaged in bathing/dressing at sit > stand level at sink.  Pt demonstrating improved sequencing during self-care tasks compared to previous admission (last week).  Min assist sit > stand and to maintain standing balance while pt washed buttocks and when pulling pants over hips.  Engaged in sit > stand x4 with min assist-min guard.  Left upright in w/c with nurse tech present to set up for lunch.  Therapy Documentation Precautions:  Precautions Precautions: Fall Required Braces or Orthoses: Other Brace/Splint Restrictions Weight Bearing Restrictions: Yes RLE Weight Bearing: Non weight bearing Pain:  Pt with no c/o pain  See Function Navigator for Current Functional Status.   Therapy/Group: Individual Therapy  Simonne Come 04/14/2017, 12:52 PM

## 2017-04-14 NOTE — Progress Notes (Addendum)
[place] PHYSICAL MEDICINE & REHABILITATION     PROGRESS NOTE  Subjective/Complaints:  Up in chair. States he needs something more for pain other than tylenol. Also would like an incentive spirometer  ROS: Patient denies fever, rash, sore throat, blurred vision, nausea, vomiting, diarrhea, cough, shortness of breath or chest pain, joint or back pain, headache, or mood change.   Objective: Vital Signs: Blood pressure 131/88, pulse 95, temperature 99 F (37.2 C), temperature source Oral, resp. rate 16, height 6\' 4"  (1.93 m), weight 130 kg (286 lb 9.6 oz), SpO2 98 %. No results found. Recent Labs    04/13/17 0655 04/13/17 1343  WBC 6.0 9.4  HGB 7.9* 8.5*  HCT 24.4* 26.9*  PLT 397 462*   Recent Labs    04/12/17 0403 04/12/17 1903 04/13/17 0655  NA 140  --  140  K 3.0*  --  3.2*  CL 105  --  107  GLUCOSE 106*  --  89  BUN 11  --  12  CREATININE 1.09 1.32* 1.13  CALCIUM 8.3*  --  8.1*   CBG (last 3)  Recent Labs    04/13/17 1635 04/13/17 2057 04/14/17 0653  GLUCAP 155* 105* 101*    Wt Readings from Last 3 Encounters:  04/12/17 130 kg (286 lb 9.6 oz)  04/08/17 (!) 138.3 kg (304 lb 14.3 oz)  04/08/17 (!) 137.2 kg (302 lb 8 oz)    Physical Exam:  BP 131/88 (BP Location: Left Arm)   Pulse 95   Temp 99 F (37.2 C) (Oral)   Resp 16   Ht 6\' 4"  (1.93 m)   Wt 130 kg (286 lb 9.6 oz)   SpO2 98%   BMI 34.89 kg/m  Constitutional: No distress . Vital signs reviewed. HEENT: EOMI, oral membranes moist Cardiovascular: RRR without murmur. No JVD    Respiratory: CTA Bilaterally without wheezes or rales. Normal effort    GI: BS +, non-tender, non-distended  Musculoskeletal: remainsTTP right stump  Neurological: He is alert and oriented.  Followed simple commands. Motor: B/l UE, LLE 5/5 proximal to distal LLE: 5/5 proximal to distal Right HF: 4+/5  Skin: Skin is warm and dry.  Right BKA with stump guard in place Psychiatric: flat but  interactive   Assessment/Plan: 1. Functional deficits secondary to right BKA which require 3+ hours per day of interdisciplinary therapy in a comprehensive inpatient rehab setting. Physiatrist is providing close team supervision and 24 hour management of active medical problems listed below. Physiatrist and rehab team continue to assess barriers to discharge/monitor patient progress toward functional and medical goals.  Function:  Bathing Bathing position      Bathing parts      Bathing assist        Upper Body Dressing/Undressing Upper body dressing                    Upper body assist        Lower Body Dressing/Undressing Lower body dressing                                  Lower body assist        Toileting Toileting   Toileting steps completed by patient: Adjust clothing prior to toileting, Performs perineal hygiene Toileting steps completed by helper: Adjust clothing after toileting    Toileting assist     Transfers Chair/bed transfer   Chair/bed transfer method: Stand  pivot Chair/bed transfer assist level: Touching or steadying assistance (Pt > 75%) Chair/bed transfer assistive device: Armrests, Medical sales representative     Max distance: 91ft Assist level: Touching or steadying assistance (Pt > 75%)   Wheelchair   Type: Manual Max wheelchair distance: 231ft Assist Level: Supervision or verbal cues  Cognition Comprehension Comprehension assist level: Follows basic conversation/direction with extra time/assistive device  Expression Expression assist level: Expresses basic needs/ideas: With extra time/assistive device  Social Interaction Social Interaction assist level: Interacts appropriately 75 - 89% of the time - Needs redirection for appropriate language or to initiate interaction.  Problem Solving Problem solving assist level: Solves basic 50 - 74% of the time/requires cueing 25 - 49% of the time  Memory Memory assist  level: Recognizes or recalls 50 - 74% of the time/requires cueing 25 - 49% of the time    Medical Problem List and Plan: 1.  Decreased functional mobility secondary to right transtibial amputation 3/81/8299 complicated by acute encephalopathy related to Cephalosporin toxicity.     -continue therapies  -limb guard to protect incision/leg 2.  DVT Prophylaxis/Anticoagulation: Subcutaneous Lovenox resumed for DVT prophylaxis 3. Pain Management: Tylenol as needed   - will add LOW dose tramadol, watching mental status closely 4. Mood: Provide emotional support 5. Neuropsych: This patient is capable of making decisions on his own behalf. 6. Skin/Wound Care: Routine skin checks 7. Fluids/Electrolytes/Nutrition: Routine I/O's 8.  ID.  Viridans strep bacteremia.  Presently on Unasyn after Cephalosporin toxicity.    -low grade temp this morning    -continue thru weekend  -recheck cbc monday 9.  Anemia/melena.  Conservative care by GI.  Continue Protonix twice daily   Hemoglobin 8.5 on 4/4--follow up monday 10.  Hypertension.  Cozaar 100 mg daily, Procardia 90 mg daily.  Monitor with increased mobility   Monitor with increased mobility 11.  Diabetes mellitus with peripheral neuropathy.  Hemoglobin A1c 12.5.  Levemir 40 units nightly, NovoLog 10 units 3 times daily.  Check blood sugars before meals and at bedtime.   Monitor with increased mobility 12.  Constipation.  Laxative assistance 13.  Legally blind. 14.  CKD stage III. Recheck Monday    Creatinine 1.13 on 4/4 15. Hypokalemia   Potassium 3.2 on 4/4  continue daily supplement 16. Hypoalbuminemia  Supplement initiated on 4/4  LOS (Days) 2 A FACE TO FACE EVALUATION WAS PERFORMED  Meredith Staggers 04/14/2017 9:20 AM

## 2017-04-14 NOTE — Evaluation (Signed)
Speech Language Pathology Assessment and Plan  Patient Details  Name: Trevor [name] MRN: [id number] Date of Birth: October 14, 1955  SLP Diagnosis: Cognitive Impairments  Rehab Potential: Good ELOS: 7-10 days     Today's Date: 04/14/2017 SLP Individual Time: 1352-1430 SLP Individual Time Calculation (min): 38 min   Problem List:  Patient Active Problem List   Diagnosis Date Noted  . Unilateral complete BKA, right, sequela ([place])   . Benign essential HTN   . Hypoalbuminemia due to protein-calorie malnutrition ([place])   . S/P below knee amputation, right ([place]) 04/12/2017  . Acute blood loss anemia   . Other encephalopathy 04/08/2017  . Encephalopathy 04/08/2017  . Altered mental status 04/08/2017  . Labile blood pressure   . Labile blood glucose   . Drug induced constipation   . Stage 3 chronic kidney disease ([place])   . Bacteremia   . S/P unilateral BKA (below knee amputation), right ([place]) 04/04/2017  . PAD (peripheral artery disease) ([place])   . Type 2 diabetes mellitus with right diabetic foot ulcer ([place])   . Post-operative pain   . Legally blind   . Upper GI bleed   . Streptococcal bacteremia 04/01/2017  . Hypokalemia 03/30/2017  . Uncontrolled type 2 diabetes mellitus with hyperglycemia, with long-term current use of insulin ([place]) 03/30/2017  . Type 2 diabetes mellitus with peripheral neuropathy ([place]) 03/30/2017  . AKI (acute kidney injury) ([place]) 03/30/2017  . CKD stage 3 due to type 2 diabetes mellitus ([place]) 03/30/2017  . Sepsis ([place]) 03/30/2017  . Heart murmur 11/22/2016  . Hyperlipidemia associated with type 2 diabetes mellitus ([place]) 11/22/2016  . Obstructive sleep apnea syndrome 11/22/2016  . Essential hypertension 12/17/2012   Past Medical History:  Past Medical History:  Diagnosis Date  . Diabetes mellitus without complication ([place])   . Heart murmur   . Hyperlipidemia   . Hypertension   . Sleep apnea    Past Surgical History:  Past Surgical History:  Procedure  Laterality Date  . AMPUTATION Right 03/31/2017   Procedure: RIGHT FOOT 1ST AND 2ND RAY AMPUTATION;  Surgeon: Newt Minion, MD;  Location: [facility];  Service: Orthopedics;  Laterality: Right;  . AMPUTATION Right 04/01/2017   Procedure: AMPUTATION BELOW KNEE;  Surgeon: Newt Minion, MD;  Location: [facility];  Service: Orthopedics;  Laterality: Right;  . CORNEAL TRANSPLANT      Assessment / Plan / Recommendation Clinical Impression   Trevor Nakama Murdockis a 62 y.o.right-handed malewith history of diabetes mellitus, hypertension,legally blind, CKD stage III.Per chartreview and patient, patientlives with nephew in [place]. Nephew attends school and works.Family in area work.Presented 03/29/2017 with findings of osteomyelitis abscess of the right foot. 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 Sharol Given. Hospital course pain managementand transition to oral Dilaudid as needed.Wound VAC appliedfor 1 week postoperatively.Plan stump shrinker per [employer] prosthetics.Blood cultures +1 of 2 for strep VIRIDANSbacteremia and advised IV ceftezole and through 04/15/2017.Acute blood loss anemia monitored. Creatinine stable with history of CKD stage III, baseline 2.06Physical therapy evaluation completed with recommendations of physical medicine rehab consult.Patient was admitted for a comprehensive rehab program 04/04/2017.  Initially needing some encouragement to participate.    During his rehabilitation course patient with noted anemia, melena with gastroenterology consulted and advised supportive care with PPI.  He was transfused 2 units of packed red blood cells.  On 04/08/2017 patient with altered mental status and more somnolent.  Discharged to acute care services 04/08/2017 for ongoing workup. MRI of the brain reviewed, unremarkable for acute intracranial process. CT  Angio of the chest showed no pulmonary emboli.  EEG negative.  Carotid Dopplers with no ICA stenosis.  Initial plan for lumbar puncture but discontinued.  Echocardiogram ?pending. Per neurology follow-up workup it was felt that altered mental status was related to his cephalosporin toxicity and Ancef was changed to Unasyn.  Latest blood cultures were collected 04/08/2017 no growth to date.  Mental status has continued to improve.  Physical and occupational therapy has resumed and patient was readmitted for a comprehensive rehab program.  SLP evaluation completed on 04/14/2017 following acute ST recommendations and ongoing cognitive-linguistic concerns from PT/OT.  Results are as follows: Pt presents with mild cognitive linguistic deficits characterized by word finding difficulty, decreased recall of new information, and decreased functional problem solving, all of which appear to be related to slowed processing.  As a result, pt needed min assist-supervision to complete task measures.  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  Anticipate that pt will need ST follow up at next level of care.      Skilled Therapeutic Interventions          Cognitive-linguistic evaluation completed with results and recommendations reviewed with family.     SLP Assessment  Patient will need skilled [facility] during CIR admission    Recommendations  Recommendations for Other Services: Neuropsych consult Patient destination: Home Follow up Recommendations: Outpatient SLP Equipment Recommended: None recommended by SLP    SLP Frequency 3 to 5 out of 7 days   SLP Duration  SLP Intensity  SLP Treatment/Interventions 7-10 days   Minumum of 1-2 x/day, 30 to 90 minutes  Cognitive remediation/compensation;Cueing  hierarchy;Internal/external aids;Multimodal communication approach;Patient/family education    Pain Pain Assessment Pain Scale: 0-10 Pain Score: 0-No pain  Prior Functioning Cognitive/Linguistic Baseline: Within functional limits Type of Home: House  Lives With: Family Available Help at Discharge: Family;Available PRN/intermittently Education: RN Vocation: Part time employment  Function:  Eating Eating                 Cognition Comprehension Comprehension assist level: Follows basic conversation/direction with extra time/assistive device  Expression   Expression assist level: Expresses basic needs/ideas: With extra time/assistive device  Social Interaction Social Interaction assist level: Interacts appropriately 75 - 89% of the time - Needs redirection for appropriate language or to initiate interaction.  Problem Solving Problem solving assist level: Solves basic 50 - 74% of the time/requires cueing 25 - 49% of the time  Memory Memory assist level: Recognizes or recalls 50 - 74% of the time/requires cueing 25 - 49% of the time   Short Term Goals: Week 1: SLP Short Term Goal 1 (Week 1): STG=LTG due to ELOS  Refer to Care Plan for Long Term Goals  Recommendations for other services: Neuropsych  Discharge Criteria: Patient will be discharged from SLP if patient refuses treatment 3 consecutive times without medical reason, if treatment goals not met, if there is a change in medical status, if patient makes no progress towards goals or if patient is discharged from hospital.  The above assessment, treatment plan, treatment alternatives and goals were discussed and mutually agreed upon: by patient  Emilio Math 04/14/2017, 2:40 PM

## 2017-04-14 NOTE — Progress Notes (Signed)
Physical Therapy Session Note  Patient Details  Name: Trevor [name] MRN: [id number] Date of Birth: 25-Nov-1955  Today's Date: 04/14/2017 PT Individual Time: 0802-0900 PT Individual Time Calculation (min): 58 min   Short Term Goals: Week 1:  PT Short Term Goal 1 (Week 1): STG=LTG due to ELOS   Skilled Therapeutic Interventions/Progress Updates:   Pt received supine in bed and agreeable to PT. Supine>sit transfer without assist or cues.   Sitting balance EOB for PT to don grip sock. Sit<>stand with sueprvision assist from EOB and 1 UE support on RW. Standing balance with supervision assist and BUE support for PT to assist pt to don brief. Pt required min assist when attempting to use LUE to manage clothing.   Stand pivot transfer to [facility] with min assist and min cues for AD management.   WC mobility instructed by PT x 186f with supervision assist. Min cues for obstacle navigation in tight space of pt room.   PT instructed pt in LE and UE therex.  LAQ x 15 with 3 sec hold.  Hip abduction with level 2 tband x 12 Reciprocal marches in sitting x 12 BLE.  Standing hip extension x 12 with 3 sec hold.  Press [facility] from [facility] x 10, 3 sec hold Chest press/ shoulder press with 5lb bar weight x 20   Min cues from PT throughout LE therex to decrease speed of eccentric movement to improve stregnthening aspect of exercise.   Gait training x 247fwith RW and min assist from PT. Min cues from PT for AD management with turning to sit WC as well as proper step length to reduce risk of fall.    Patient returned to room and [facility] bell in reach and all needs met.         Therapy Documentation Precautions:  Precautions Precautions: Fall Required Braces or Orthoses: Other Brace/Splint Restrictions Weight Bearing Restrictions: Yes RLE Weight Bearing: Non weight bearing Pain:  Denies  See Function Navigator for Current Functional Status.   Therapy/Group: Individual  Therapy  AuLorie Phenix/05/2017, 9:36 AM

## 2017-04-14 NOTE — Progress Notes (Signed)
Occupational Therapy Session Note  Patient Details  Name: Trevor [name] MRN: [id number] Date of Birth: 04/11/1955  Today's Date: 04/14/2017 OT Individual Time: 1505-1605 OT Individual Time Calculation (min): 60 min    Short Term Goals: Week 1:  OT Short Term Goal 1 (Week 1): LTG-STG  Skilled Therapeutic Interventions/Progress Updates:    1:1. Pt originally reporting fatigue and 2/10 pain in LLE, however with encouragement participates in tx. Pt requesting to use urinal despite OT encouraging pt to practice toilet transfer. After voiding bladder, pt completes stand pivot transfer with min A for steadying with VC for safety awareness during transition reclienr>w/c. Pt propels w/c to ADL apartment to discuss  Bathroom set up, shower access and DME available. Pt practices w/c<>TTB set up similarly to home bathroom requiring 180 degree turn with RW. PT requires MOD A overall for steadying during turn. Pt requires multiple demonstrations before feeling comfortable and confident with participating in transfer. OT educates pt on leg rest managmenet/amputee pad management on w/c. Pt would benefit from continued practice. Exited session after pt transfering back to bed similar as above with call light in reach, all needs met and bed exit alarm on.  Therapy Documentation Precautions:  Precautions Precautions: Fall Required Braces or Orthoses: Other Brace/Splint Restrictions Weight Bearing Restrictions: Yes RLE Weight Bearing: Non weight bearing 1 See Function Navigator for Current Functional Status.   Therapy/Group: Individual Therapy  Tonny Branch 04/14/2017, 4:11 PM

## 2017-04-14 NOTE — Care Management Note (Signed)
Inpatient [facility] Individual Statement of Services  Patient Name:  LILIANA BRENTLINGER  Date:  04/14/2017  Welcome to the [facility].  Our goal is to provide you with an individualized program based on your diagnosis and situation, designed to meet your specific needs.  With this comprehensive rehabilitation program, you will be expected to participate in at least 3 hours of rehabilitation therapies Monday-Friday, with modified therapy programming on the weekends.  Your rehabilitation program will include the following services:  Physical Therapy (PT), Occupational Therapy (OT), Speech Therapy (ST), 24 hour per day rehabilitation nursing, Therapeutic Recreaction (TR), Neuropsychology, Case Management (Social Worker), Rehabilitation Medicine, Nutrition Services and Pharmacy Services  Weekly team conferences will be held on Wednesdays to discuss your progress.  Your Social Worker will talk with you frequently to get your input and to update you on team discussions.  Team conferences with you and your family in attendance may also be held.  Expected length of stay: 7-10 days    Overall anticipated outcome: supervision to modified independent  Depending on your progress and recovery, your program may change. Your Social Worker will coordinate services and will keep you informed of any changes. Your Social Worker's name and contact numbers are listed  below.  The following services may also be recommended but are not provided by the [facility] will be made to provide these services after discharge if needed.  Arrangements include referral to agencies that provide these services.  Your insurance has been verified to be:  None  Your primary doctor is:  [facility]  Pertinent information will be shared with  your doctor and your insurance company.  Social Worker:  [place], [place] or ([phone]   Information discussed with and copy given to patient by: Lennart Pall, 04/14/2017, 5:10 PM

## 2017-04-14 NOTE — IPOC Note (Deleted)
Overall Plan of Care [facility]) Patient Details Name: Trevor [name] MRN: [id number] DOB: Jun 04, 1955  Admitting Diagnosis: <principal problem not specified>right bka, encephalopathy  Hospital Problems: Active Problems:   S/P below knee amputation, right (HCC)   Unilateral complete BKA, right, sequela ([place])   Benign essential HTN   Hypoalbuminemia due to protein-calorie malnutrition [facility])     Functional Problem List: Nursing Medication Management, Pain, Safety, Skin Integrity, Motor  PT Balance, Edema, Endurance, Motor, Pain, Safety, Sensory, Skin Integrity  OT Balance, Safety, Cognition, Edema, Skin Integrity, Endurance, Motor, Pain  SLP    TR         Basic ADL's: OT Grooming, Bathing, Dressing, Toileting     Advanced  ADL's: OT       Transfers: PT Bed Mobility, Bed to Chair, Car, Manufacturing systems engineer, Metallurgist: PT Ambulation, Stairs     Additional Impairments: OT None  SLP Communication, Social Cognition expression Problem Solving, Memory  TR      Anticipated Outcomes Item Anticipated Outcome  Self Feeding n/a  Swallowing      Basic self-care  supervision  Toileting  supervision   Bathroom Transfers supervision  Bowel/Bladder  continent of bowel and bladder during admission  Transfers  Supervision assist with LRAD   Locomotion  Mod I in [place]. supervision assist Ambulatory for short distances.   Communication     Cognition  mod I  Pain  pain free or pain less than 3  Safety/Judgment  free from fall and adhere to safety plan   Therapy Plan: PT Intensity: Minimum of 1-2 x/day ,45 to 90 minutes PT Frequency: 5 out of 7 days PT Duration Estimated Length of Stay: 7-10days  OT Intensity: Minimum of 1-2 x/day, 45 to 90 minutes OT Frequency: 5 out of 7 days OT Duration/Estimated Length of Stay: 7-10 days SLP Intensity: Minumum of 1-2 x/day, 30 to 90 minutes SLP Frequency: 3 to 5 out of 7 days SLP Duration/Estimated Length of Stay:  7-10 days     Team Interventions: Nursing Interventions Patient/Family Education, Pain Management, Skin Care/Wound Management, Psychosocial Support, Disease Management/Prevention, Discharge Planning  PT interventions Ambulation/gait training, Discharge planning, Balance/vestibular training, Cognitive remediation/compensation, Disease management/prevention, Community reintegration, DME/adaptive equipment instruction, Functional electrical stimulation, Functional mobility training, Pain management, Patient/family education, Neuromuscular re-education, Stair training, Psychosocial support, Therapeutic Activities, Skin care/wound management, Splinting/orthotics, Therapeutic Exercise, Visual/perceptual remediation/compensation, UE/LE Coordination activities, UE/LE Strength taining/ROM, Wheelchair propulsion/positioning  OT Interventions Training and development officer, Cognitive remediation/compensation, Discharge planning, DME/adaptive equipment instruction, Disease mangement/prevention, Functional mobility training, Pain management, Patient/family education, Psychosocial support, Self Care/advanced ADL retraining, Skin care/wound managment, Splinting/orthotics, Therapeutic Activities, Therapeutic Exercise, UE/LE Strength taining/ROM, Wheelchair propulsion/positioning  SLP Interventions Cognitive remediation/compensation, Cueing hierarchy, Internal/external aids, Multimodal communication approach, Patient/family education  TR Interventions    SW/CM Interventions Discharge Planning, Psychosocial Support, Patient/Family Education   Barriers to Discharge MD  Medical stability  Nursing Decreased caregiver support, Medical stability, Weight bearing restrictions    PT Inaccessible home environment, Decreased caregiver support, Home environment access/layout, IV antibiotics    OT      SLP      SW       Team Discharge Planning: Destination: PT-Home ,OT- Home , SLP-Home Projected Follow-up: PT-Home health  PT, OT-  Home health OT, SLP-Outpatient SLP Projected Equipment Needs: PT-Rolling walker with 5" wheels, Wheelchair cushion (measurements), Wheelchair (measurements), OT- Tub/shower bench, Other (comment), SLP-None recommended by SLP Equipment Details: PT- , OT-drop arm BSC  Patient/family involved in discharge  planning: PT- Patient,  OT-Patient, SLP-Patient  MD ELOS: 7-10 days Medical Rehab Prognosis:  Excellent Assessment: The patient has been admitted for CIR therapies with the diagnosis of right BKA. The team will be addressing functional mobility, strength, stamina, balance, safety, adaptive techniques and equipment, self-care, bowel and bladder mgt, patient and caregiver education, pre-prosthetic education, pain mgt, cognition, community reentry. Goals have been set at mod I for mobility, self care and cognition.    Meredith Staggers, MD, FAAPMR      See Team Conference Notes for weekly updates to the plan of care

## 2017-04-14 NOTE — Progress Notes (Signed)
Physical Therapy Session Note  Patient Details  Name: Trevor [name] MRN: [id number] Date of Birth: 12/16/1955  Today's Date: 04/14/2017 PT Individual Time: 1000-1030 PT Individual Time Calculation (min): 30 min   Short Term Goals: Week 1:  PT Short Term Goal 1 (Week 1): STG=LTG due to ELOS   Skilled Therapeutic Interventions/Progress Updates:    Pt seated in w/c in room, agreeable to participate in therapy session. Pt reports no pain this AM. Reviewed management of w/c parts with patient: removing leg rests and armrest closest to transfer side prior to transfer, locking brakes. Pt requires hand-over-hand cues to accurately locate w/c parts, will continue to review. Stand pivot transfer bed to w/c with RW and CGA to min assist for balance. Sit to/from supine from flat bed with Supervision. Reviewed donning/doffing limb guard for R residual limb, pt requires some assist, will continue to review. Supine RLE therex x 15 reps: SLR, quad sets, hip abd, hip add squeeze; seated RLE therex x 15 reps: marches, LAQ. Stand pivot transfer back to w/c at end of session with RW and CGA for balance. Pt left seated in w/c in room with needs in reach.  Therapy Documentation Precautions:  Precautions Precautions: Fall Required Braces or Orthoses: Other Brace/Splint Restrictions Weight Bearing Restrictions: Yes RLE Weight Bearing: Non weight bearing  See Function Navigator for Current Functional Status.   Therapy/Group: Individual Therapy  Excell Seltzer, PT, DPT  04/14/2017, 12:10 PM

## 2017-04-14 NOTE — Telephone Encounter (Signed)
Patient failed to provide 2019 financial documentation.  No additional medication assistance will be provided by [facility] without the required proof of income documentation.  Patient notified by letter.  Rosaisela Jamroz J. Aveon Colquhoun Care Manager [facility] 

## 2017-04-15 DIAGNOSIS — G8918 Other acute postprocedural pain: Secondary | ICD-10-CM

## 2017-04-15 DIAGNOSIS — R7881 Bacteremia: Secondary | ICD-10-CM

## 2017-04-15 DIAGNOSIS — B955 Unspecified streptococcus as the cause of diseases classified elsewhere: Secondary | ICD-10-CM

## 2017-04-15 DIAGNOSIS — S88111S Complete traumatic amputation at level between knee and ankle, right lower leg, sequela: Secondary | ICD-10-CM

## 2017-04-15 DIAGNOSIS — E1142 Type 2 diabetes mellitus with diabetic polyneuropathy: Secondary | ICD-10-CM

## 2017-04-15 LAB — GLUCOSE, CAPILLARY
GLUCOSE-CAPILLARY: 90 mg/dL (ref 65–99)
GLUCOSE-CAPILLARY: 80 mg/dL (ref 65–99)
GLUCOSE-CAPILLARY: 114 mg/dL — AB (ref 65–99)
GLUCOSE-CAPILLARY: 263 mg/dL — AB (ref 65–99)
Glucose-Capillary: 58 mg/dL — ABNORMAL LOW (ref 65–99)

## 2017-04-15 NOTE — Progress Notes (Signed)
Trevor [name] is a 62 y.o. male Oct 21, 1955 [id number]  Subjective: Patient reports feeling well today.  Wants to know if infection has resolved and how many additional days of antibiotics remain.  Also request pretreatment for pain associated with therapy with tramadol, specifically requested not to be given Percocet or anything stronger than tramadol  Objective: Vital signs in last 24 hours: Temp:  [98.2 F (36.8 C)] 98.2 F (36.8 C) (04/05 1415) Pulse Rate:  [94] 94 (04/05 1415) Resp:  [16] 16 (04/05 1415) BP: (133)/(85) 133/85 (04/05 1415) SpO2:  [100 %] 100 % (04/05 1415) Weight change:  Last BM Date: 04/14/17  Intake/Output from previous day: 04/05 0701 - 04/06 0700 In: 614 [P.O.:564; IV Piggyback:50] Out: 1000 [Urine:1000]  Physical Exam General: No apparent distress   sitting upright in bed. Lungs: Normal effort. Lungs clear to auscultation, no crackles or wheezes. Cardiovascular: Regular rate and rhythm, no edema Wounds: R stump dressing clean, dry, intact.   Lab Results: BMET    Component Value Date/Time   NA 140 04/13/2017 0655   NA 143 11/22/2016 1212   NA 132 (L) 12/11/2012 0836   K 3.2 (L) 04/13/2017 0655   K 3.7 12/11/2012 0836   CL 107 04/13/2017 0655   CL 100 12/11/2012 0836   CO2 26 04/13/2017 0655   CO2 25 12/11/2012 0836   GLUCOSE 89 04/13/2017 0655   GLUCOSE 396 (H) 12/11/2012 0836   BUN 12 04/13/2017 0655   BUN 38 (H) 11/22/2016 1212   BUN 28 (H) 12/11/2012 0836   CREATININE 1.13 04/13/2017 0655   CREATININE 1.58 (H) 12/11/2012 0836   CALCIUM 8.1 (L) 04/13/2017 0655   CALCIUM 9.2 12/11/2012 0836   GFRNONAA >60 04/13/2017 0655   GFRNONAA 48 (L) 12/11/2012 0836   GFRAA >60 04/13/2017 0655   GFRAA 56 (L) 12/11/2012 0836   CBC    Component Value Date/Time   WBC 9.4 04/13/2017 1343   RBC 3.13 (L) 04/13/2017 1343   HGB 8.5 (L) 04/13/2017 1343   HGB 14.0 11/22/2016 1212   HCT 26.9 (L) 04/13/2017 1343   HCT 31.8 (L) 04/09/2017 0030   PLT  462 (H) 04/13/2017 1343   PLT 260 11/22/2016 1212   MCV 85.9 04/13/2017 1343   MCV 81 11/22/2016 1212   MCH 27.2 04/13/2017 1343   MCHC 31.6 04/13/2017 1343   RDW 17.1 (H) 04/13/2017 1343   RDW 14.0 11/22/2016 1212   LYMPHSABS 1.8 04/13/2017 0655   MONOABS 0.4 04/13/2017 0655   EOSABS 0.2 04/13/2017 0655   BASOSABS 0.0 04/13/2017 0655   CBG's (last 3):   Recent Labs    04/14/17 2059 04/15/17 0615 04/15/17 1158  GLUCAP 90 90 80   LFT's Lab Results  Component Value Date   ALT 14 (L) 04/13/2017   AST 23 04/13/2017   ALKPHOS 52 04/13/2017   BILITOT 0.5 04/13/2017    Studies/Results: No results found.  Medications:  I have reviewed the patient's current medications. Scheduled Medications: . enoxaparin (LOVENOX) injection  40 mg Subcutaneous Q24H  . feeding supplement (PRO-STAT SUGAR FREE 64)  30 mL Oral BID  . insulin aspart  0-15 Units Subcutaneous TID WC  . insulin aspart  10 Units Subcutaneous TID WC  . insulin detemir  40 Units Subcutaneous QHS  . losartan  100 mg Oral Daily  . NIFEdipine  90 mg Oral Daily  . pantoprazole  40 mg Oral BID  . potassium chloride  20 mEq Oral BID   PRN  Medications: acetaminophen **OR** acetaminophen, barrier cream, ondansetron **OR** ondansetron (ZOFRAN) IV, sorbitol, traMADol  Assessment/Plan: Principal Problem:   Unilateral complete BKA, right, sequela (HCC) Active Problems:   Essential hypertension   Type 2 diabetes mellitus with peripheral neuropathy (HCC)   Streptococcal bacteremia   Post-operative pain   Benign essential HTN   Hypoalbuminemia due to protein-calorie malnutrition (HCC)   Length of stay, days: 3  1. Right BKA March 23. Mow admitted to CIR undergoing PT and OT therapies.  Continue wound care and inpatient rehab as outlined, no changes. 2.  Viridans strep bacteremia.  Currently on Unasyn having a reaction to cephalosporins.  We will need to review number of antibiotic days remaining.  WBC yesterday with  downward, improved trend and patient remains afebrile. 3.  Type 2 diabetes with peripheral neuropathy.  Continue current therapies with sliding scale insulin.  CBGs remain in desired range   Valerie A. Asa Lente, MD 04/15/2017, 12:37 PM   ;

## 2017-04-15 NOTE — Progress Notes (Signed)
Hypoglycemic Event  CBG: 58  Treatment: dinner  Symptoms: none  Follow-up CBG: Time:1703 CBG Result:114  Possible Reasons for event: unknown  Comments/MD notified:[place]    Trevor [name], Trevor [name]

## 2017-04-16 ENCOUNTER — Inpatient Hospital Stay (HOSPITAL_COMMUNITY): Payer: Medicare Other | Admitting: Physical Therapy

## 2017-04-16 ENCOUNTER — Inpatient Hospital Stay (HOSPITAL_COMMUNITY): Payer: Self-pay

## 2017-04-16 DIAGNOSIS — I10 Essential (primary) hypertension: Secondary | ICD-10-CM

## 2017-04-16 LAB — GLUCOSE, CAPILLARY
GLUCOSE-CAPILLARY: 169 mg/dL — AB (ref 65–99)
Glucose-Capillary: 121 mg/dL — ABNORMAL HIGH (ref 65–99)
Glucose-Capillary: 52 mg/dL — ABNORMAL LOW (ref 65–99)
Glucose-Capillary: 203 mg/dL — ABNORMAL HIGH (ref 65–99)

## 2017-04-16 NOTE — Progress Notes (Signed)
Occupational Therapy Session Note  Patient Details  Name: Trevor [name] MRN: [id number] Date of Birth: [birth year]  Today's Date: 04/16/2017 OT Individual Time: 9102-8902 OT Individual Time Calculation (min): 57 min    Short Term Goals: Week 1:  OT Short Term Goal 1 (Week 1): LTG-STG  Skilled Therapeutic Interventions/Progress Updates:    1;1. Pt agreeable to tx and requesting to go outside. Pt propels w/c to/from all tx destinations for improved endurance and BUE strengthening required for BADl engagement and community mobility. Pt completes BUE therex with 5# dumbbells :shoulder flex/ext, elbow flex/ext, horizontal ab/adduct, int/ext rotation, shoudler press, chest press and trunk rotation for 1x15 reps. Pt completes stand pivot transfer back to bed with bed rail and Vc for locking brakes prior to standing. Exited session with pt seated in w/c, call light in reach and all needs met  Therapy Documentation Precautions:  Precautions Precautions: Fall Required Braces or Orthoses: Other Brace/Splint Restrictions Weight Bearing Restrictions: Yes RLE Weight Bearing: Non weight bearing General:   Vital Signs: Therapy Vitals Temp: 98 F (36.7 C) Temp Source: Oral Pulse Rate: 94 Resp: 18 BP: (!) 150/82 Patient Position (if appropriate): Lying Oxygen Therapy SpO2: 100 % O2 Device: Room Air Pain: Pain Assessment Pain Score: 0-No pain ADL:  See Function Navigator for Current Functional Status.   Therapy/Group: Individual Therapy  Tonny Branch 04/16/2017, 3:43 PM

## 2017-04-16 NOTE — Progress Notes (Signed)
Occupational Therapy Session Note  Patient Details  Name: Trevor [name] MRN: [id number] Date of Birth: Feb 06, 1955  Today's Date: 04/16/2017 OT Individual Time: 0802-2336 OT Individual Time Calculation (min): 55 min    Short Term Goals: Week 1:  OT Short Term Goal 1 (Week 1): LTG-STG  Skilled Therapeutic Interventions/Progress Updates:    1:1. Pain 2/10 in RLE but its "manageable." Pt agreeable to bathing and dressing at sink level this date. Pt able to direct set up of squat pivot transfer to L with VC for locking breaks prior to transfer. Pt propels w/c to sink and bathes UB with set up and no VC for sequencing this date. Pt dons shirt with set up. Pt bathes LB with touching A for peri care for standing balance. Pt requires reminder for locking breaks during task completion. Pt begins to LB clothing threading BLE into pants, however stops before standing up to complete clothing management to brush teeth at sink. Pt completes standing at sink to advance patns past hips with CGA. Exited session with pt seated in w/c with breakfast tray set up, all needs met and call light in reach  Therapy Documentation Precautions:  Precautions Precautions: Fall Required Braces or Orthoses: Other Brace/Splint Restrictions Weight Bearing Restrictions: Yes RLE Weight Bearing: Non weight bearing General:   VSee Function Navigator for Current Functional Status.   Therapy/Group: Individual Therapy  Tonny Branch 04/16/2017, 8:18 AM

## 2017-04-16 NOTE — Progress Notes (Signed)
Physical Therapy Session Note  Patient Details  Name: Trevor [name] MRN: [id number] Date of Birth: 1955-08-26  Today's Date: 04/16/2017 PT Individual Time: 1045-1200 PT Individual Time Calculation (min): 75 min   Short Term Goals: Week 1:  PT Short Term Goal 1 (Week 1): STG=LTG due to ELOS   Skilled Therapeutic Interventions/Progress Updates:  Pt was sleepy but willing to participate with therapy. Pt propelled w/c to and from gym with B UEs and S with increased time. Pt performed multiple stand pivot and sit to stand transfers with rolling walker and min guard to min A with verbal cues. Pt performed supine to edge of mat and edge of mat to supine with c/s to min A and verbal cues. On mat performed LE exercises, 3 sets x 10 reps each, heel slides, hip abd/add, SAQs, bridging, and R quad sets. Pt returned to room. Pt transferred w/c to edge of bed with rolling walker and min guard to min A. Pt transferred edge of bed to supine with S. Pt left sitting up in bed with all needs within reach.   Therapy Documentation Precautions:  Precautions Precautions: Fall Required Braces or Orthoses: Other Brace/Splint Restrictions Weight Bearing Restrictions: Yes RLE Weight Bearing: Non weight bearing General:   Pain: No c/o pain.   See Function Navigator for Current Functional Status.   Therapy/Group: Individual Therapy  Dub Amis 04/16/2017, 12:18 PM

## 2017-04-16 NOTE — Progress Notes (Signed)
Trevor [name] is a 62 y.o. male December 08, 1955 [id number]  Subjective: Reports poor sleep due to disruption from IV last night. Also still having 2-3 loose BM each day, annoyed by waking from sleep because of same  Objective: Vital signs in last 24 hours: Temp:  [98.2 F (36.8 C)-98.4 F (36.9 C)] 98.2 F (36.8 C) (04/07 0500) Pulse Rate:  [91-96] 96 (04/07 0500) Resp:  [17-18] 18 (04/07 0500) BP: (138-139)/(86-93) 139/86 (04/07 0500) SpO2:  [99 %-100 %] 99 % (04/07 0500) Weight change:  Last BM Date: 04/15/17  Intake/Output from previous day: 04/06 0701 - 04/07 0700 In: 644 [P.O.:644] Out: 1600 [Urine:1600]  Physical Exam General: No apparent distress   In [facility] Lungs: Normal effort. Lungs clear to auscultation, no crackles or wheezes. Cardiovascular: Regular rate and rhythm, no edema Musculoskeletal:  R BKA Wounds: dressing is clean, dry, intact.   Lab Results: BMET    Component Value Date/Time   NA 140 04/13/2017 0655   NA 143 11/22/2016 1212   NA 132 (L) 12/11/2012 0836   K 3.2 (L) 04/13/2017 0655   K 3.7 12/11/2012 0836   CL 107 04/13/2017 0655   CL 100 12/11/2012 0836   CO2 26 04/13/2017 0655   CO2 25 12/11/2012 0836   GLUCOSE 89 04/13/2017 0655   GLUCOSE 396 (H) 12/11/2012 0836   BUN 12 04/13/2017 0655   BUN 38 (H) 11/22/2016 1212   BUN 28 (H) 12/11/2012 0836   CREATININE 1.13 04/13/2017 0655   CREATININE 1.58 (H) 12/11/2012 0836   CALCIUM 8.1 (L) 04/13/2017 0655   CALCIUM 9.2 12/11/2012 0836   GFRNONAA >60 04/13/2017 0655   GFRNONAA 48 (L) 12/11/2012 0836   GFRAA >60 04/13/2017 0655   GFRAA 56 (L) 12/11/2012 0836   CBC    Component Value Date/Time   WBC 9.4 04/13/2017 1343   RBC 3.13 (L) 04/13/2017 1343   HGB 8.5 (L) 04/13/2017 1343   HGB 14.0 11/22/2016 1212   HCT 26.9 (L) 04/13/2017 1343   HCT 31.8 (L) 04/09/2017 0030   PLT 462 (H) 04/13/2017 1343   PLT 260 11/22/2016 1212   MCV 85.9 04/13/2017 1343   MCV 81 11/22/2016 1212   MCH 27.2  04/13/2017 1343   MCHC 31.6 04/13/2017 1343   RDW 17.1 (H) 04/13/2017 1343   RDW 14.0 11/22/2016 1212   LYMPHSABS 1.8 04/13/2017 0655   MONOABS 0.4 04/13/2017 0655   EOSABS 0.2 04/13/2017 0655   BASOSABS 0.0 04/13/2017 0655   CBG's (last 3):   Recent Labs    04/15/17 1703 04/15/17 2236 04/16/17 0650  GLUCAP 114* 263* 169*   LFT's Lab Results  Component Value Date   ALT 14 (L) 04/13/2017   AST 23 04/13/2017   ALKPHOS 52 04/13/2017   BILITOT 0.5 04/13/2017    Studies/Results: No results found.  Medications:  I have reviewed the patient's current medications. Scheduled Medications: . enoxaparin (LOVENOX) injection  40 mg Subcutaneous Q24H  . feeding supplement (PRO-STAT SUGAR FREE 64)  30 mL Oral BID  . insulin aspart  0-15 Units Subcutaneous TID WC  . insulin aspart  10 Units Subcutaneous TID WC  . insulin detemir  40 Units Subcutaneous QHS  . losartan  100 mg Oral Daily  . NIFEdipine  90 mg Oral Daily  . pantoprazole  40 mg Oral BID  . potassium chloride  20 mEq Oral BID   PRN Medications: acetaminophen **OR** acetaminophen, barrier cream, ondansetron **OR** ondansetron (ZOFRAN) IV, sorbitol, traMADol  Assessment/Plan:  Principal Problem:   Unilateral complete BKA, right, sequela (HCC) Active Problems:   Type 2 diabetes mellitus with peripheral neuropathy (HCC)   Streptococcal bacteremia   Post-operative pain   Benign essential HTN   Hypoalbuminemia due to protein-calorie malnutrition (HCC)   Length of stay, days: 4  See problems as listed 4/6 note- unchanged.  On chart review, I note initial Bld cx positive 3/20 with Strep, same Strep as Wound cx of osteomyelitis prompting R BKA - Initiated on Ancef which was to run through 4/6 according to original notes. Since change to Unasyn, end date was less clearly defined.  Will recheck CBC in AM and propose to DC Unasyn after today's doses if WBC normalized and remains afebrile  Review other meds for  contribution to "loose" stools and DC any non-essential meds  Wes Lezotte A. Asa Lente, MD 04/16/2017, 11:24 AM

## 2017-04-17 ENCOUNTER — Inpatient Hospital Stay (HOSPITAL_COMMUNITY): Payer: Self-pay

## 2017-04-17 ENCOUNTER — Inpatient Hospital Stay (HOSPITAL_COMMUNITY): Payer: Medicare Other | Admitting: Occupational Therapy

## 2017-04-17 LAB — BASIC METABOLIC PANEL
ANION GAP: 9 (ref 5–15)
BUN: 11 mg/dL (ref 6–20)
CO2: 25 mmol/L (ref 22–32)
Calcium: 8.4 mg/dL — ABNORMAL LOW (ref 8.9–10.3)
Chloride: 104 mmol/L (ref 101–111)
Creatinine, Ser: 1.21 mg/dL (ref 0.61–1.24)
GLUCOSE: 229 mg/dL — AB (ref 65–99)
POTASSIUM: 3.3 mmol/L — AB (ref 3.5–5.1)
Sodium: 138 mmol/L (ref 135–145)

## 2017-04-17 LAB — CBC
HEMATOCRIT: 25.7 % — AB (ref 39.0–52.0)
Hemoglobin: 8 g/dL — ABNORMAL LOW (ref 13.0–17.0)
MCH: 26.9 pg (ref 26.0–34.0)
MCHC: 31.1 g/dL (ref 30.0–36.0)
MCV: 86.5 fL (ref 78.0–100.0)
Platelets: 321 10*3/uL (ref 150–400)
RBC: 2.97 MIL/uL — AB (ref 4.22–5.81)
RDW: 17.3 % — ABNORMAL HIGH (ref 11.5–15.5)
WBC: 4.4 10*3/uL (ref 4.0–10.5)

## 2017-04-17 LAB — GLUCOSE, CAPILLARY
GLUCOSE-CAPILLARY: 109 mg/dL — AB (ref 65–99)
GLUCOSE-CAPILLARY: 157 mg/dL — AB (ref 65–99)
Glucose-Capillary: 197 mg/dL — ABNORMAL HIGH (ref 65–99)
Glucose-Capillary: 139 mg/dL — ABNORMAL HIGH (ref 65–99)

## 2017-04-17 NOTE — Progress Notes (Signed)
Physical Therapy Session Note  Patient Details  Name: TARVARIS PUGLIA MRN: [id number] Date of Birth: [birth year]  Today's Date: 04/17/2017 PT Individual Time: 3716-9678 PT Individual Time Calculation (min): 45 min   Short Term Goals: Week 1:  PT Short Term Goal 1 (Week 1): STG=LTG due to ELOS   Skilled Therapeutic Interventions/Progress Updates:    Pt supine in bed upon PT arrival, agreeable to therapy tx and reports pain 2/10 in R distal limb. Pt transferred to sitting with supervision and verbal cues for techniques. Pt performed squat pivot transfer from bed>w/c with supervision and verbal cues for techniques. Pt propelled w/c from room>gym with supervision using B UEs. Pt performed squat pivot transfer from w/c<>mat with supervision and verbal cues for techniques. Pt transferred to supine with supervision, in supine performed LE therex for strengthening, 2 x 10 each: SLR, hip abduction, SAQ. Pt transferred from supine>prone, educated on the importance of prone position for hip flexor stretching, pt able to hold prone on elbows x 60 sec. In prone pt performed 2 x 10 hip extension for strengthening. Pt transferred back to supine and to sitting with supervision. Transferred to w/c squat pivot and propelled w/c back to room.   Therapy Documentation Precautions:  Precautions Precautions: Fall Required Braces or Orthoses: Other Brace/Splint Restrictions Weight Bearing Restrictions: Yes RLE Weight Bearing: Non weight bearing   See Function Navigator for Current Functional Status.   Therapy/Group: Individual Therapy  Netta Corrigan, PT, DPT 04/17/2017, 7:40 AM

## 2017-04-17 NOTE — Progress Notes (Signed)
Social Work  Social Work Assessment and Plan  Patient Details  Name: Trevor [name] MRN: [id number] Date of Birth: 1955-08-27  Today's Date: 04/14/2017  Problem List:  Patient Active Problem List   Diagnosis Date Noted  . Unilateral complete BKA, right, sequela ([place])   . Benign essential HTN   . Hypoalbuminemia due to protein-calorie malnutrition ([place])   . S/P below knee amputation, right ([place]) 04/12/2017  . Acute blood loss anemia   . Other encephalopathy 04/08/2017  . Encephalopathy 04/08/2017  . Altered mental status 04/08/2017  . Labile blood pressure   . Labile blood glucose   . Drug induced constipation   . Stage 3 chronic kidney disease ([place])   . Bacteremia   . S/P unilateral BKA (below knee amputation), right ([place]) 04/04/2017  . PAD (peripheral artery disease) ([place])   . Type 2 diabetes mellitus with right diabetic foot ulcer ([place])   . Post-operative pain   . Legally blind   . Upper GI bleed   . Streptococcal bacteremia 04/01/2017  . Hypokalemia 03/30/2017  . Uncontrolled type 2 diabetes mellitus with hyperglycemia, with long-term current use of insulin ([place]) 03/30/2017  . Type 2 diabetes mellitus with peripheral neuropathy ([place]) 03/30/2017  . AKI (acute kidney injury) ([place]) 03/30/2017  . CKD stage 3 due to type 2 diabetes mellitus ([place]) 03/30/2017  . Sepsis ([place]) 03/30/2017  . Heart murmur 11/22/2016  . Hyperlipidemia associated with type 2 diabetes mellitus ([place]) 11/22/2016  . Obstructive sleep apnea syndrome 11/22/2016  . Essential hypertension 12/17/2012   Past Medical History:  Past Medical History:  Diagnosis Date  . Diabetes mellitus without complication ([place])   . Heart murmur   . Hyperlipidemia   . Hypertension   . Sleep apnea    Past Surgical History:  Past Surgical History:  Procedure Laterality Date  . AMPUTATION Right 03/31/2017   Procedure: RIGHT FOOT 1ST AND 2ND RAY AMPUTATION;  Surgeon: Newt Minion, MD;  Location: [facility];  Service:  Orthopedics;  Laterality: Right;  . AMPUTATION Right 04/01/2017   Procedure: AMPUTATION BELOW KNEE;  Surgeon: Newt Minion, MD;  Location: [facility];  Service: Orthopedics;  Laterality: Right;  . CORNEAL TRANSPLANT     Social History:  reports that he has never smoked. He has never used smokeless tobacco. He reports that he does not drink alcohol or use drugs.  Family / Support Systems Marital Status: Single Patient Roles: Other (Comment)(uncle) Other Supports: neice and [place], [place] [employer])  @ (C) [phone];  aunt, Delvis Kau (local) @ [phone];  nephew, Elberta Fortis Anticipated Caregiver: Niece and fellow church memebers as needed  Ability/Limitations of Caregiver: Niece has 3 children and works part time  Careers adviser: Intermittent Family Dynamics: Pt notes his neice provide as much support as she is able.  He does consider her his primary support person.  Social History Preferred language: English Religion: None Cultural Background: NA Read: Yes Write: Yes Employment Status: Employed Name of Employer: [employer] - was working prn as [facility] [employer] Return to Work Plans: doubtful - pt reports he does have a Recruitment consultant with [employer] - need to confirm with neice. Legal Hisotry/Current Legal Issues: none Guardian/Conservator: none - per MD, pt is capable of making decisions on his own behalf.   Abuse/Neglect Abuse/Neglect Assessment Can Be Completed: Yes Physical Abuse: Denies Verbal Abuse: Denies Sexual Abuse: Denies Exploitation of patient/patient's resources: Denies Self-Neglect: Denies  Emotional Status Pt's affect, behavior adn adjustment status: Pt lying in bed  and appears fatigued but able to complete assessment interview without much difficulty.  Was unclear on some specifics i.e. [employer] app status, primary medical provider, etc and I will follow up with neice.  He denies any significant emotional distress, however, may benefit from neuropsychology  consult for added support. Recent Psychosocial Issues: None Pyschiatric History: None Substance Abuse History: None  Patient / Family Perceptions, Expectations & Goals Pt/Family understanding of illness & functional limitations: Pt and neice with good understanding of the need for BKA following rapid deterioration of existing wound on his leg.   Premorbid pt/family roles/activities: Prior to ~ 3 weeks PTA, pt was completely independent and working p/t - f/t as assignments were made. Anticipated changes in roles/activities/participation: Little change if pt able to reach mod ind goals, however, neice may need to increase her involvement or arrange for other support. Pt/family expectations/goals: "I hope I can get home and live on my own."  [employer]: None Premorbid Home Care/DME Agencies: None Transportation available at discharge: yes Resource referrals recommended: Neuropsychology, Support group (specify)  Discharge Planning Living Arrangements: Alone Support Systems: Other relatives, Water engineer, Social worker community Type of Residence: Private residence Insurance Resources: Teacher, adult education Resources: Employment, Secondary school teacher Screen Referred: Previously completed Living Expenses: Education officer, community Management: Patient Does the patient have any problems obtaining your medications?: Yes (Describe)(no insurance) Home Management: pt Patient/Family Preliminary Plans: Pt plans to d/c to his home with intermittent support of neice and others. Sw Barriers to Discharge: Lack of/limited family support Sw Barriers to Discharge Comments: need to confirm with neice if she is able to coordinated increased support for pt in home at least initially. Social Work Anticipated Follow Up Needs: HH/OP Expected length of stay: 7-10 days  Clinical Impression Unfortunate gentleman here following a BKA due to abscess of right foot.  He has support of a local  niece and a few other family members, however, only intermittent support available.  Pt hopeful he can reach a mod ind level of function.  He denies any significant emotional distress - will monitor and refer for neuropsychology support as needed.  SW to follow for support and d/c planning needs.  Katianna Mcclenney 04/14/2017, 4:35 PM

## 2017-04-17 NOTE — Progress Notes (Signed)
[facility] PHYSICAL MEDICINE & REHABILITATION     PROGRESS NOTE  Subjective/Complaints:  Patient up in his chair.  Just finished working with occupational therapy and dressed.  States that his pain is under better control.  He is only using the tramadol occasionally.  ROS: Patient denies fever, rash, sore throat, blurred vision, nausea, vomiting, diarrhea, cough, shortness of breath or chest pain, joint or back pain, headache, or mood change.   Objective: Vital Signs: Blood pressure (!) 143/94, pulse 96, temperature 98.8 F (37.1 C), temperature source Oral, resp. rate 20, height 6\' 4"  (1.93 m), weight 130 kg (286 lb 9.6 oz), SpO2 100 %. No results found. Recent Labs    04/17/17 0420  WBC 4.4  HGB 8.0*  HCT 25.7*  PLT 321   Recent Labs    04/17/17 0420  NA 138  K 3.3*  CL 104  GLUCOSE 229*  BUN 11  CREATININE 1.21  CALCIUM 8.4*   CBG (last 3)  Recent Labs    04/16/17 1625 04/16/17 2125 04/17/17 0653  GLUCAP 52* 203* 197*    Wt Readings from Last 3 Encounters:  04/12/17 130 kg (286 lb 9.6 oz)  04/08/17 (!) 138.3 kg (304 lb 14.3 oz)  04/08/17 (!) 137.2 kg (302 lb 8 oz)    Physical Exam:  BP (!) 143/94 (BP Location: Left Arm)   Pulse 96   Temp 98.8 F (37.1 C) (Oral)   Resp 20   Ht 6\' 4"  (1.93 m)   Wt 130 kg (286 lb 9.6 oz)   SpO2 100%   BMI 34.89 kg/m  Constitutional: No distress . Vital signs reviewed. HEENT: EOMI, oral membranes moist Cardiovascular: RRR without murmur. No JVD    Respiratory: CTA Bilaterally without wheezes or rales. Normal effort    GI: BS +, non-tender, non-distended   Musculoskeletal: Stump less tender Neurological: He is alert and oriented.  Followed simple commands. Motor: B/l UE, LLE 5/5 proximal to distal LLE: 5/5 proximal to distal Right HF: 4+/5  Skin: Skin is warm and dry.    Wound clean and intact Psychiatric: Alert and appropriate   Assessment/Plan: 1. Functional deficits secondary to right BKA which require 3+  hours per day of interdisciplinary therapy in a comprehensive inpatient rehab setting. Physiatrist is providing close team supervision and 24 hour management of active medical problems listed below. Physiatrist and rehab team continue to assess barriers to discharge/monitor patient progress toward functional and medical goals.  Function:  Bathing Bathing position   Position: Wheelchair/chair at sink  Bathing parts Body parts bathed by patient: Right arm, Left arm, Chest, Abdomen, Front perineal area, Buttocks, Right upper leg, Left upper leg, Left lower leg Body parts bathed by helper: Back  Bathing assist Assist Level: Touching or steadying assistance(Pt > 75%)      Upper Body Dressing/Undressing Upper body dressing                    Upper body assist        Lower Body Dressing/Undressing Lower body dressing                                  Lower body assist        Toileting Toileting   Toileting steps completed by patient: Adjust clothing prior to toileting, Performs perineal hygiene Toileting steps completed by helper: Adjust clothing prior to toileting, Performs perineal hygiene, Adjust clothing after  toileting    Toileting assist Assist level: Touching or steadying assistance (Pt.75%)   Transfers Chair/bed transfer   Chair/bed transfer method: Stand pivot Chair/bed transfer assist level: Touching or steadying assistance (Pt > 75%) Chair/bed transfer assistive device: Armrests, Medical sales representative     Max distance: 67ft Assist level: Touching or steadying assistance (Pt > 75%)   Wheelchair   Type: Manual Max wheelchair distance: 150 Assist Level: Supervision or verbal cues  Cognition Comprehension Comprehension assist level: Understands basic 90% of the time/cues < 10% of the time  Expression Expression assist level: Expresses basic needs/ideas: With no assist  Social Interaction Social Interaction assist level: Interacts  appropriately 90% of the time - Needs monitoring or encouragement for participation or interaction.  Problem Solving Problem solving assist level: Solves basic 90% of the time/requires cueing < 10% of the time  Memory Memory assist level: Recognizes or recalls 75 - 89% of the time/requires cueing 10 - 24% of the time    Medical Problem List and Plan: 1.  Decreased functional mobility secondary to right transtibial amputation 7/56/4332 complicated by acute encephalopathy related to Cephalosporin toxicity.     -continue therapies  -limb guard to protect incision/leg 2.  DVT Prophylaxis/Anticoagulation: Subcutaneous Lovenox resumed for DVT prophylaxis 3. Pain Management: Tylenol as needed   -Tolerating low-dose tramadol as needed 4. Mood: Provide emotional support 5. Neuropsych: This patient is capable of making decisions on his own behalf. 6. Skin/Wound Care: Routine skin checks 7. Fluids/Electrolytes/Nutrition: Routine I/O's 8.  ID.  Viridans strep bacteremia.  Presently on Unasyn after Cephalosporin toxicity.    -Now afebrile   -White blood cell count down to 4.4 today   -Discontinue antibiotics  9.  Anemia/melena.  Conservative care by GI.  Continue Protonix twice daily   Hemoglobin 7.9 on 4/4, then repeat was 8.5 on 4/4--now 8.0 today 10.  Hypertension.  Cozaar 100 mg daily, Procardia 90 mg daily.  Monitor with increased mobility    need to monitor diastolic blood pressure 11.  Diabetes mellitus with peripheral neuropathy.  Hemoglobin A1c 12.5.  Levemir 40 units nightly, NovoLog 10 units 3 times daily.  Check blood sugars before meals and at bedtime.   Sugars have been inconsistent and labile  -Continue with same regimen with sliding scale insulin for coverage 12.  Constipation.  Laxative assistance 13.  Legally blind. 14.  CKD stage III. Recheck Monday    Creatinine 1.21 on 4/8 15. Hypokalemia   Potassium 3.3 on 4/8  continue daily supplement 16. Hypoalbuminemia  Supplement  initiated on 4/4  LOS (Days) 5 A FACE TO FACE EVALUATION WAS PERFORMED  Meredith Staggers 04/17/2017 9:28 AM

## 2017-04-17 NOTE — Progress Notes (Signed)
Occupational Therapy Session Note  Patient Details  Name: Trevor [name] MRN: [id number] Date of Birth: [birth year]  Today's Date: 04/17/2017 OT Individual Time: 0730-0830 OT Individual Time Calculation (min): 60 min    Short Term Goals: Week 1:  OT Short Term Goal 1 (Week 1): LTG-STG  Skilled Therapeutic Interventions/Progress Updates:    Treatment session with focus on ADL retraining with functional transfers and dynamic standing balance. Pt received in bed asleep, required increased time to fully arouse to participate in treatment session.  Pt requested to start therapies later in AM and requesting PT session first, as he reports he is stronger in the AM.  Min guard sit > stand with RW and stand pivot to w/c.  Engaged in bathing/dressing at sit > stand level at sink with setup and then min guard when standing to wash buttocks and pull pants over hips.  Pt demonstrating improved dynamic standing balance to manage clothing.  Pt left upright in w/c finishing grooming tasks and awaiting breakfast tray.  Therapy Documentation Precautions:  Precautions Precautions: Fall Required Braces or Orthoses: Other Brace/Splint Restrictions Weight Bearing Restrictions: Yes RLE Weight Bearing: Non weight bearing Pain:  Pt with no c/o pain  See Function Navigator for Current Functional Status.   Therapy/Group: Individual Therapy  Simonne Come 04/17/2017, 9:42 AM

## 2017-04-17 NOTE — Progress Notes (Signed)
Speech Language Pathology Daily Session Note  Patient Details  Name: OLEGARIO EMBERSON MRN: [id number] Date of Birth: Apr 16, 1955  Today's Date: 04/17/2017 SLP Individual Time: 1400-1430 / 1100-1200 SLP Individual Time Calculation (min): 30 min / 60 min  Short Term Goals: Week 1: SLP Short Term Goal 1 (Week 1): STG=LTG due to ELOS  Skilled Therapeutic Interventions: 1# Skilled ST services focused on cognitive skills. SLP facilitated semi-complex problem solving skills utilizing ALFA 's daily math problems and money management at Mod I level. SLP facilitated word finding in structured tasks, divergent naming requring extra time and min A verbal cues and supervision A verbal cues for convergent tasks. SLP facilitated recall of novel information, 5 words, pt recalled after 20 minute delay and 45 minute delay requiring min A verbal cues. Pt was left in room with call bell within reach. Recommend to continue skilled ST services.   #2 Skilled ST services focused on speech skills. SLP facilitated word finding skills in communication barrier task requiring extra time. SLP facilitated medication management, pt required supervision A question cues during moderately distracting environment to monitor errors. Pt was left in room with call bell within reach. Recommend to continue skilled ST services.        Function:  Eating Eating                 Cognition Comprehension Comprehension assist level: Understands basic 90% of the time/cues < 10% of the time  Expression   Expression assist level: Expresses basic needs/ideas: With no assist;Expresses complex 90% of the time/cues < 10% of the time  Social Interaction Social Interaction assist level: Interacts appropriately 90% of the time - Needs monitoring or encouragement for participation or interaction.  Problem Solving Problem solving assist level: Solves complex problems: With extra time  Memory Memory assist level: Recognizes or recalls 75 - 89%  of the time/requires cueing 10 - 24% of the time    Pain Pain Assessment Pain Score: 0-No pain  Therapy/Group: Individual Therapy  Anairis Knick  [facility] 04/17/2017, 3:33 PM

## 2017-04-18 ENCOUNTER — Inpatient Hospital Stay (HOSPITAL_COMMUNITY): Payer: Self-pay

## 2017-04-18 ENCOUNTER — Inpatient Hospital Stay (HOSPITAL_COMMUNITY): Payer: Self-pay | Admitting: Occupational Therapy

## 2017-04-18 ENCOUNTER — Inpatient Hospital Stay (HOSPITAL_COMMUNITY): Payer: Self-pay | Admitting: Physical Therapy

## 2017-04-18 ENCOUNTER — Inpatient Hospital Stay (HOSPITAL_COMMUNITY): Payer: Self-pay | Admitting: Speech Pathology

## 2017-04-18 LAB — GLUCOSE, CAPILLARY
GLUCOSE-CAPILLARY: 112 mg/dL — AB (ref 65–99)
Glucose-Capillary: 94 mg/dL (ref 65–99)
Glucose-Capillary: 92 mg/dL (ref 65–99)
Glucose-Capillary: 144 mg/dL — ABNORMAL HIGH (ref 65–99)

## 2017-04-18 MED ORDER — POTASSIUM CHLORIDE CRYS ER 20 MEQ PO TBCR
30.0000 meq | EXTENDED_RELEASE_TABLET | Freq: Two times a day (BID) | ORAL | Status: DC
  Administered 2017-04-18 – 2017-04-20 (×5): 30 meq via ORAL
  Filled 2017-04-18 (×5): qty 1

## 2017-04-18 NOTE — Progress Notes (Signed)
Trevor [name] PHYSICAL MEDICINE & REHABILITATION     PROGRESS NOTE  Subjective/Complaints:  Pt seen lying in bed this AM.  He slept well overnight.  He denies complaints.   ROS: Denies CP, SOB, N/V/D.  Objective: Vital Signs: Blood pressure (!) 142/89, pulse 87, temperature 98 F (36.7 C), temperature source Oral, resp. rate 16, height 6\' 4"  (1.93 m), weight 130 kg (286 lb 9.6 oz), SpO2 98 %. No results found. Recent Labs    04/17/17 0420  WBC 4.4  HGB 8.0*  HCT 25.7*  PLT 321   Recent Labs    04/17/17 0420  NA 138  K 3.3*  CL 104  GLUCOSE 229*  BUN 11  CREATININE 1.21  CALCIUM 8.4*   CBG (last 3)  Recent Labs    04/17/17 1645 04/17/17 2058 04/18/17 0656  GLUCAP 157* 139* 112*    Wt Readings from Last 3 Encounters:  04/12/17 130 kg (286 lb 9.6 oz)  04/08/17 (!) 138.3 kg (304 lb 14.3 oz)  04/08/17 (!) 137.2 kg (302 lb 8 oz)    Physical Exam:  BP (!) 142/89 (BP Location: Left Arm)   Pulse 87   Temp 98 F (36.7 C) (Oral)   Resp 16   Ht 6\' 4"  (1.93 m)   Wt 130 kg (286 lb 9.6 oz)   SpO2 98%   BMI 34.89 kg/m  Constitutional: No distress . Vital signs reviewed. HEENT: EOMI, oral membranes moist Cardiovascular: RRR. No JVD    Respiratory: CTA Bilaterally. Normal effort    GI: BS +, non-distended   Musculoskeletal: Stump less tender Neurological: He is alert and oriented.  Followed simple commands. Motor: B/l UE, LLE 5/5 proximal to distal LLE: 5/5 proximal to distal Right HF: 4+/5  Skin: Incision c/d/i Psychiatric: Alert and appropriate   Assessment/Plan: 1. Functional deficits secondary to right BKA which require 3+ hours per day of interdisciplinary therapy in a comprehensive inpatient rehab setting. Physiatrist is providing close team supervision and 24 hour management of active medical problems listed below. Physiatrist and rehab team continue to assess barriers to discharge/monitor patient progress toward functional and medical  goals.  Function:  Bathing Bathing position   Position: Wheelchair/chair at sink  Bathing parts Body parts bathed by patient: Right arm, Left arm, Chest, Abdomen, Front perineal area, Buttocks, Right upper leg, Left upper leg, Left lower leg Body parts bathed by helper: Back  Bathing assist Assist Level: Touching or steadying assistance(Pt > 75%)      Upper Body Dressing/Undressing Upper body dressing   What is the patient wearing?: Pull over shirt/dress     Pull over shirt/dress - Perfomed by patient: Thread/unthread right sleeve, Thread/unthread left sleeve, Put head through opening, Pull shirt over trunk          Upper body assist Assist Level: Set up   Set up : To obtain clothing/put away  Lower Body Dressing/Undressing Lower body dressing   What is the patient wearing?: Pants, Non-skid slipper socks     Pants- Performed by patient: Thread/unthread right pants leg, Thread/unthread left pants leg, Pull pants up/down   Non-skid slipper socks- Performed by patient: Don/doff left sock                    Lower body assist Assist for lower body dressing: Touching or steadying assistance (Pt > 75%)      Toileting Toileting   Toileting steps completed by patient: Performs perineal hygiene, Adjust clothing prior to toileting Toileting  steps completed by helper: Adjust clothing after toileting    Toileting assist Assist level: Touching or steadying assistance (Pt.75%)   Transfers Chair/bed transfer   Chair/bed transfer method: Squat pivot Chair/bed transfer assist level: Supervision or verbal cues Chair/bed transfer assistive device: Armrests, Bedrails     Locomotion Ambulation     Max distance: 33ft Assist level: Touching or steadying assistance (Pt > 75%)   Wheelchair   Type: Manual Max wheelchair distance: 150 Assist Level: Supervision or verbal cues  Cognition Comprehension Comprehension assist level: Follows basic conversation/direction with no  assist  Expression Expression assist level: Expresses basic needs/ideas: With no assist  Social Interaction Social Interaction assist level: Interacts appropriately 90% of the time - Needs monitoring or encouragement for participation or interaction.  Problem Solving Problem solving assist level: Solves basic problems with no assist  Memory Memory assist level: Recognizes or recalls 90% of the time/requires cueing < 10% of the time    Medical Problem List and Plan: 1.  Decreased functional mobility secondary to right transtibial amputation 6/50/3546 complicated by acute encephalopathy related to Cephalosporin toxicity.     -continue CIR   -limb guard to protect incision/leg   Notes reviewed 2.  DVT Prophylaxis/Anticoagulation: Subcutaneous Lovenox resumed for DVT prophylaxis 3. Pain Management: Tylenol as needed   -Tolerating low-dose tramadol as needed 4. Mood: Provide emotional support 5. Neuropsych: This patient is capable of making decisions on his own behalf. 6. Skin/Wound Care: Routine skin checks 7. Fluids/Electrolytes/Nutrition: Routine I/O's 8.  ID.  Viridans strep bacteremia.  Completed Unasyn after Cephalosporin toxicity.    -Now afebrile  -Discontinue antibiotics  9.  Anemia/melena.  Conservative care by GI.  Continue Protonix twice daily   Hemoglobin 8.0 on 4/8   Cont to monitor 10.  Hypertension.  Cozaar 100 mg daily, Procardia 90 mg daily.  Monitor with increased mobility    Relatively controlled on 4/9 11.  Diabetes mellitus with peripheral neuropathy.  Hemoglobin A1c 12.5.  Levemir 40 units nightly, NovoLog 10 units 3 times daily.  Check blood sugars before meals and at bedtime.  -Continue with same regimen with sliding scale insulin for coverage   Improving control on 4/9 12.  Constipation.  Laxative assistance 13.  Legally blind. 14.  CKD stage III.     Creatinine 1.21 on 4/8 15. Hypokalemia   Potassium 3.3 on 4/8  Daily supplement increased on 4/9 16.  Hypoalbuminemia  Supplement initiated on 4/4  LOS (Days) 6 A FACE TO FACE EVALUATION WAS PERFORMED  Somtochukwu Woollard Lorie Phenix 04/18/2017 8:01 AM

## 2017-04-18 NOTE — Progress Notes (Signed)
Speech Language Pathology Daily Session Note  Patient Details  Name: Trevor [name] MRN: [id number] Date of Birth: 1955/12/27  Today's Date: 04/18/2017 SLP Individual Time: 0910-1005 SLP Individual Time Calculation (min): 55 min  Short Term Goals: Week 1: SLP Short Term Goal 1 (Week 1): STG=LTG due to ELOS  Skilled Therapeutic Interventions:   Pt was seen for skilled ST targeting communication goals.  SLP facilitated the session with an abstract verbal reasoning task to address higher level word finding.  Pt completed task with x1 subtle/supervision cue for specific word finding as pt became circumlocutory.   Otherwise, pt was mod I for abstract word finding and his speech subjectively appeared to become more fluent as he became more conversant with therapist.   Pt was  left in wheelchair with call bell within reach.  Continue per current plan of care.     Function:  Eating Eating                 Cognition Comprehension Comprehension assist level: Understands complex 90% of the time/cues 10% of the time  Expression   Expression assist level: Expresses basic needs/ideas: With extra time/assistive device  Social Interaction Social Interaction assist level: Interacts appropriately with others with medication or extra time (anti-anxiety, antidepressant).  Problem Solving Problem solving assist level: Solves basic 90% of the time/requires cueing < 10% of the time  Memory Memory assist level: More than reasonable amount of time    Pain Pain Assessment Pain Scale: 0-10 Pain Score: 0-No pain   Therapy/Group: Individual Therapy  Kenia Teagarden, Selinda Orion 04/18/2017, 11:44 AM

## 2017-04-18 NOTE — Progress Notes (Signed)
Physical Therapy Session Note  Patient Details  Name: Trevor [name] MRN: [id number] Date of Birth: January 10, 1956  Today's Date: 04/18/2017 PT Individual Time: 4259-5638 PT Individual Time Calculation (min): 25 min   Short Term Goals: Week 1:  PT Short Term Goal 1 (Week 1): STG=LTG due to ELOS   Skilled Therapeutic Interventions/Progress Updates:   Pt in supine using urinal, requesting to wait until he was finished. Session focused on functional transfers and UE strength and conditioning. Transferred to EOB w/ supervision and to w/c via stand pivot using RW w/ min guard. Verbal cues on technique and encouragement to practice technique as pt wanted to scoot over instead. Self-propelled w/c to/from gym to work on UE strengthening and performed UE strengthening exercises included 1kg ball trunk rotation 2x20 and 1kg ball press 2x5. Returned to room and ended session in w/c, call bell within reach and all needs met.   Therapy Documentation Precautions:  Precautions Precautions: Fall Required Braces or Orthoses: Other Brace/Splint Restrictions Weight Bearing Restrictions: Yes RLE Weight Bearing: Non weight bearing Vital Signs: Therapy Vitals Temp: 98.5 F (36.9 C) Temp Source: Oral Pulse Rate: (!) 102 Resp: 19 BP: 127/83 Patient Position (if appropriate): Sitting Oxygen Therapy SpO2: 100 % O2 Device: Room Air  See Function Navigator for Current Functional Status.   Therapy/Group: Individual Therapy  Saja Bartolini K Arnette 04/18/2017, 4:39 PM

## 2017-04-18 NOTE — Progress Notes (Signed)
Physical Therapy Session Note  Patient Details  Name: Trevor [name] MRN: [id number] Date of Birth: [birth year]  Today's Date: 04/18/2017 PT Individual Time: 0800-0857 PT Individual Time Calculation (min): 57 min   Short Term Goals: Week 1:  PT Short Term Goal 1 (Week 1): STG=LTG due to ELOS   Skilled Therapeutic Interventions/Progress Updates:    Pt supine in bed upon PT arrival, agreeable to therapy tx and reports pain 2/10 in R distal limb. Pt donned briefs and pants in supine with min assist, transferred to sitting EOB with supervision. Pt transferred from bed>w/c squat pivot with supervision and verbal cues for techniques/set up. Pt propelled w/c to sink and brushed teeth with supervision. Pt propelled w/c from room>gym x 150 ft with supervision using B UEs. Pt transferred from w/c<>squat pivot with supervision. Pt performed sit<>stands from mat x 3 with min assist using RW. In standing pt worked on standing balance alternating single UE support to toss horse shoes x 2 trials. Pt performed standing therex for R LE strengthening to include 2 x 10 of each: hip abduction, hip flexion, hip extension. In sitting pt performed 2 x 10 LAQ bilaterally for strengthening. Pt transferred to w/c and propelled to rehab apartment with supervision. Pt performed stand pivot transfer from w/c<>bed with RW and min assist. Pt propelled back to room and left seated in w/c with needs in reach.   Therapy Documentation Precautions:  Precautions Precautions: Fall Required Braces or Orthoses: Other Brace/Splint Restrictions Weight Bearing Restrictions: Yes RLE Weight Bearing: Non weight bearing   See Function Navigator for Current Functional Status.   Therapy/Group: Individual Therapy  Netta Corrigan, PT, DPT 04/18/2017, 7:43 AM

## 2017-04-18 NOTE — Progress Notes (Signed)
Occupational Therapy Session Note  Patient Details  Name: Trevor [name] MRN: [id number] Date of Birth: 08/15/1955  Today's Date: 04/18/2017 OT Individual Time: 1105-1200 OT Individual Time Calculation (min): 55 min    Short Term Goals: Week 1:  OT Short Term Goal 1 (Week 1): LTG-STG  Skilled Therapeutic Interventions/Progress Updates:    Treatment session with focus on ADL retraining with dynamic standing balance and w/c positioning.  Pt received upright in w/c reporting no pain.  Engaged in bathing/dressing at sit > stand level at sink with supervision.  Pt able to complete all aspects of bathing with supervision, close supervision when standing to wash buttocks and pull pants over hips.  Pt asking questions about maneuvering w/c on ramp.  Engaged in mobility on ramp with pt able to propel w/c uphill with moderate exertion and min cues and min assist when maneuvering downhill to manage speed.  Educated on managing speed with pt able to complete with more control during 2nd attempt.  Returned to room and completed squat pivot transfer back to bed with min guard.    Therapy Documentation Precautions:  Precautions Precautions: Fall Required Braces or Orthoses: Other Brace/Splint Restrictions Weight Bearing Restrictions: Yes RLE Weight Bearing: Non weight bearing Pain: Pain Assessment Pain Scale: 0-10 Pain Score: 0-No pain  See Function Navigator for Current Functional Status.   Therapy/Group: Individual Therapy  Simonne Come 04/18/2017, 12:55 PM

## 2017-04-19 ENCOUNTER — Inpatient Hospital Stay (HOSPITAL_COMMUNITY): Payer: Self-pay | Admitting: Physical Therapy

## 2017-04-19 ENCOUNTER — Inpatient Hospital Stay (HOSPITAL_COMMUNITY): Payer: Medicare Other | Admitting: Occupational Therapy

## 2017-04-19 ENCOUNTER — Inpatient Hospital Stay (HOSPITAL_COMMUNITY): Payer: Self-pay | Admitting: Speech Pathology

## 2017-04-19 LAB — CREATININE, SERUM
Creatinine, Ser: 1.17 mg/dL (ref 0.61–1.24)
GFR calc Af Amer: >60 mL/min (ref >60)
GFR calc non Af Amer: >60 mL/min (ref >60)

## 2017-04-19 LAB — GLUCOSE, CAPILLARY
GLUCOSE-CAPILLARY: 168 mg/dL — AB (ref 65–99)
GLUCOSE-CAPILLARY: 174 mg/dL — AB (ref 65–99)
GLUCOSE-CAPILLARY: 216 mg/dL — AB (ref 65–99)
Glucose-Capillary: 134 mg/dL — ABNORMAL HIGH (ref 65–99)
Glucose-Capillary: 92 mg/dL (ref 65–99)

## 2017-04-19 MED ORDER — ALTEPLASE 2 MG IJ SOLR: 2.0000 mg | Freq: Once | INTRAMUSCULAR | Status: DC

## 2017-04-19 MED ORDER — SIMETHICONE 80 MG PO CHEW: 80.0000 mg | CHEWABLE_TABLET | Freq: Four times a day (QID) | ORAL | Status: DC | PRN

## 2017-04-19 NOTE — Progress Notes (Signed)
Physical Therapy Session Note  Patient Details  Name: Trevor [name] MRN: [id number] Date of Birth: Aug 30, 1955  Today's Date: 04/19/2017 PT Individual Time: 0800-0900 PT Individual Time Calculation (min): 60 min   Short Term Goals: Week 1:  PT Short Term Goal 1 (Week 1): STG=LTG due to ELOS   Skilled Therapeutic Interventions/Progress Updates:   Pt received supine in bed and agreeable to PT. Supine>sit transfer without assist or cues   Sit<>stand from elevated bed with supervision assist and RW. Stand pivot to anf from [facility] with CGA from PT to manage clothing throughout treatment. Min cues for improved control of descent into seat.   WC mobility instructed by PT x 287f, [id number] [id number] assist for awareness of obstacles without glasses.   Gait training with RW x [id number] assist from PT min cues for decreased force of impact with heel contact on the LLE by improving use of UE  Nustep endurance training 6 min +4 min level 5 with BUE and LLE. Min cues for increased use LLE and improved full ROM pt rates Borg RPE 15/20 upon completion.   Patient returned to room and [facility] in reach and all needs met.           Therapy Documentation Precautions:  Precautions Precautions: Fall Required Braces or Orthoses: Other Brace/Splint Restrictions Weight Bearing Restrictions: Yes RLE Weight Bearing: Non weight bearing Pain:   2/10 RLE. Surgical    See Function Navigator for Current Functional Status.   Therapy/Group: Individual Therapy  AustLorie Phenix0/2019, 9:06 AM

## 2017-04-19 NOTE — Progress Notes (Signed)
Physical Therapy Session Note  Patient Details  Name: Trevor [name] MRN: [id number] Date of Birth: [birth year]  Today's Date: 04/19/2017 PT Individual Time: 1000-1045 PT Individual Time Calculation (min): 45 min   Short Term Goals: Week 1:  PT Short Term Goal 1 (Week 1): STG=LTG due to ELOS   Skilled Therapeutic Interventions/Progress Updates:    no c/o pain.  Session focus on transfers, balance, and strengthening.    Pt requesting to use bathroom, maneuvers w/c into bathroom with supervision. Stand/pivot to and from toilet with steady assist, and pt requires assist to pull pants over hips following toileting.  Standing balance/therex for 2x15 hip flexion, hip abduction, and hip extension, +15 reps LAQ with hold at end range.  Pt returned to room at end of session and positioned upright in w/c with call bell in reach and needs met.   Therapy Documentation Precautions:  Precautions Precautions: Fall Required Braces or Orthoses: Other Brace/Splint Restrictions Weight Bearing Restrictions: Yes RLE Weight Bearing: Non weight bearing   See Function Navigator for Current Functional Status.   Therapy/Group: Individual Therapy  Michel Santee 04/19/2017, 10:51 AM

## 2017-04-19 NOTE — Progress Notes (Signed)
Speech Language Pathology Daily Session Note  Patient Details  Name: Trevor [name] MRN: [id number] Date of Birth: 1955/01/14  Today's Date: 04/19/2017 SLP Individual Time: 0935-1000 SLP Individual Time Calculation (min): 25 min  Short Term Goals: Week 1: SLP Short Term Goal 1 (Week 1): STG=LTG due to ELOS  Skilled Therapeutic Interventions:  Pt was seen for skilled ST targeting cognitive goals.  Pt reports that he feels his thinking is "clearer" from initial evaluation but not yet back to baseline.  SLP initiated skilled education regarding memory compensatory strategies emphasizing environmental organization, routines, written aids, and visual reminders.  Pt able to immediately recall 4/5 strategies discussed with mod I.  All questions were answered to pt's satisfaction at this time.  Pt was left resting in wiheelchair with call bell within reach.  Continue per current plan of care.    Function:  Eating Eating                 Cognition Comprehension Comprehension assist level: Follows complex conversation/direction with extra time/assistive device  Expression   Expression assist level: Expresses complex 90% of the time/cues < 10% of the time  Social Interaction Social Interaction assist level: Interacts appropriately with others with medication or extra time (anti-anxiety, antidepressant).  Problem Solving Problem solving assist level: Solves basic 90% of the time/requires cueing < 10% of the time  Memory Memory assist level: More than reasonable amount of time    Pain Pain Assessment Pain Scale: 0-10 Pain Score: 0-No pain  Therapy/Group: Individual Therapy  Carmaleta Youngers, Selinda Orion 04/19/2017, 12:25 PM

## 2017-04-19 NOTE — Progress Notes (Signed)
Occupational Therapy Session Note  Patient Details  Name: Trevor [name] MRN: [id number] Date of Birth: [birth year]  Today's Date: 04/19/2017 OT Individual Time: 1100-1200 OT Individual Time Calculation (min): 60 min    Short Term Goals: Week 1:  OT Short Term Goal 1 (Week 1): LTG-STG  Skilled Therapeutic Interventions/Progress Updates:    Treatment session with focus on functional transfers and BUE strengthening/endurance.  Pt received upright in w/c already dressed this session.  Pt reports need to toilet.  Min assist for obstacle negotiation to access bathroom via w/c due to tight turns.  Mod cues for setup of w/c and parts management to increase safety with transfer.  Min guard stand pivot transfer w/c <> toilet with use of grab bar.  Pt completed clothing management with min guard.  Pt propelled w/c to therapy gym without assist.  Squat pivot transfer to therapy mat with min cues for w/c parts management and setup for safe transfer.  Engaged in therex in sitting on edge of mat completing 3 sets of 10 chest presses, overhead presses, overhead triceps, and PNF diagonals with 4# medicine ball.  Pt took rest break between each set.  Squat pivot mat > w/c > bed with min cues for setup/positioning of w/c.  Pt reports not sleeping well overnight, therefore left supine in bed with all needs in reach.  Therapy Documentation Precautions:  Precautions Precautions: Fall Required Braces or Orthoses: Other Brace/Splint Restrictions Weight Bearing Restrictions: Yes RLE Weight Bearing: Non weight bearing Pain: Pain Assessment Pain Scale: 0-10 Pain Score: 0-No pain  See Function Navigator for Current Functional Status.   Therapy/Group: Individual Therapy  Simonne Come 04/19/2017, 12:45 PM

## 2017-04-19 NOTE — Progress Notes (Signed)
[facility] [facility] PHYSICAL MEDICINE & REHABILITATION     PROGRESS NOTE  Subjective/Complaints:  Patient seen lying in bed this morning. He states he slept fairly overnight because his IV was beeping.  ROS: Denies CP, SOB, N/V/D.  Objective: Vital Signs: Blood pressure (!) 153/98, pulse (!) 105, temperature 98.9 F (37.2 C), temperature source Oral, resp. rate 18, height 6\' 4"  (1.93 m), weight 130 kg (286 lb 9.6 oz), SpO2 97 %. No results found. Recent Labs    04/17/17 0420  WBC 4.4  HGB 8.0*  HCT 25.7*  PLT 321   Recent Labs    04/17/17 0420 04/19/17 0428  NA 138  --   K 3.3*  --   CL 104  --   GLUCOSE 229*  --   BUN 11  --   CREATININE 1.21 1.17  CALCIUM 8.4*  --    CBG (last 3)  Recent Labs    04/18/17 2045 04/19/17 0324 04/19/17 0625  GLUCAP 144* 168* 174*    Wt Readings from Last 3 Encounters:  04/12/17 130 kg (286 lb 9.6 oz)  04/08/17 (!) 138.3 kg (304 lb 14.3 oz)  04/08/17 (!) 137.2 kg (302 lb 8 oz)    Physical Exam:  BP (!) 153/98 (BP Location: Right Arm)   Pulse (!) 105   Temp 98.9 F (37.2 C) (Oral)   Resp 18   Ht 6\' 4"  (1.93 m)   Wt 130 kg (286 lb 9.6 oz)   SpO2 97%   BMI 34.89 kg/m  Constitutional: No distress . Vital signs reviewed. HEENT: EOMI, oral membranes moist Cardiovascular: RRR. No JVD    Respiratory: CTA Bilaterally. Normal effort    GI: BS +, non-distended   Musculoskeletal: Stump less tender Neurological: He is alert and oriented.  Followed simple commands. Motor: B/l UE, LLE 5/5 proximal to distal LLE: 5/5 proximal to distal Right HF: 4+-5/5  Skin: stump with dressing C/D/I Psychiatric: Alert and appropriate   Assessment/Plan: 1. Functional deficits secondary to right BKA which require 3+ hours per day of interdisciplinary therapy in a comprehensive inpatient rehab setting. Physiatrist is providing close team supervision and 24 hour management of active medical problems listed below. Physiatrist and rehab team continue to  assess barriers to discharge/monitor patient progress toward functional and medical goals.  Function:  Bathing Bathing position   Position: Wheelchair/chair at sink  Bathing parts Body parts bathed by patient: Right arm, Left arm, Chest, Abdomen, Front perineal area, Buttocks, Right upper leg, Left upper leg, Left lower leg Body parts bathed by helper: Back  Bathing assist Assist Level: Touching or steadying assistance(Pt > 75%)      Upper Body Dressing/Undressing Upper body dressing   What is the patient wearing?: Pull over shirt/dress     Pull over shirt/dress - Perfomed by patient: Thread/unthread right sleeve, Thread/unthread left sleeve, Put head through opening, Pull shirt over trunk          Upper body assist Assist Level: Set up   Set up : To obtain clothing/put away  Lower Body Dressing/Undressing Lower body dressing   What is the patient wearing?: Pants, Non-skid slipper socks     Pants- Performed by patient: Thread/unthread right pants leg, Thread/unthread left pants leg, Pull pants up/down   Non-skid slipper socks- Performed by patient: Don/doff left sock                    Lower body assist Assist for lower body dressing: Touching or steadying assistance (  Pt > 75%)      Toileting Toileting   Toileting steps completed by patient: Performs perineal hygiene, Adjust clothing prior to toileting Toileting steps completed by helper: Adjust clothing after toileting    Toileting assist Assist level: Touching or steadying assistance (Pt.75%)   Transfers Chair/bed transfer   Chair/bed transfer method: Stand pivot Chair/bed transfer assist level: Touching or steadying assistance (Pt > 75%) Chair/bed transfer assistive device: Armrests, Bedrails, Walker     Locomotion Ambulation     Max distance: 52ft Assist level: Touching or steadying assistance (Pt > 75%)   Wheelchair   Type: Manual Max wheelchair distance: 150 Assist Level: Supervision or verbal  cues  Cognition Comprehension Comprehension assist level: Understands basic 90% of the time/cues < 10% of the time  Expression Expression assist level: Expresses basic needs/ideas: With extra time/assistive device  Social Interaction Social Interaction assist level: Interacts appropriately with others with medication or extra time (anti-anxiety, antidepressant).  Problem Solving Problem solving assist level: Solves basic 90% of the time/requires cueing < 10% of the time  Memory Memory assist level: More than reasonable amount of time    Medical Problem List and Plan: 1.  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     Continue CIR   Limb guard to protect incision/leg  2.  DVT Prophylaxis/Anticoagulation: Subcutaneous Lovenox resumed for DVT prophylaxis 3. Pain Management: Tylenol as needed   -Tolerating low-dose tramadol as needed 4. Mood: Provide emotional support 5. Neuropsych: This patient is capable of making decisions on his own behalf. 6. Skin/Wound Care: Routine skin checks 7. Fluids/Electrolytes/Nutrition: Routine I/O's 8.  ID.  Viridans strep bacteremia.  Unasyn after Cephalosporin toxicity, to be completed today.    -Now afebrile 9.  Anemia/melena.  Conservative care by GI.  Continue Protonix twice daily   Hemoglobin 8.0 on 4/8  Labs ordered for tomorrow   Cont to monitor 10.  Hypertension.  Cozaar 100 mg daily, Procardia 90 mg daily.  Monitor with increased mobility    slightly labile on 4/10 11.  Diabetes mellitus with peripheral neuropathy.  Hemoglobin A1c 12.5.  Levemir 40 units nightly, NovoLog 10 units 3 times daily.  Check blood sugars before meals and at bedtime.  -Continue with same regimen with sliding scale insulin for coverage   Remained labile and 4/10, will consider medication adjustments if persistently elevated 12.  Constipation.  Laxative  assistance 13.  Legally blind. 14.  CKD stage III.     Creatinine 1.174/10 15. Hypokalemia   Potassium 3.3 on 4/8   Labs ordered for tomorrow  Daily supplement increased on 4/9 16. Hypoalbuminemia  Supplement initiated on 4/4  LOS (Days) 7 A FACE TO FACE EVALUATION WAS PERFORMED  Ender Rorke Lorie Phenix 04/19/2017 7:54 AM

## 2017-04-20 ENCOUNTER — Inpatient Hospital Stay (HOSPITAL_COMMUNITY): Payer: Self-pay | Admitting: Physical Therapy

## 2017-04-20 ENCOUNTER — Inpatient Hospital Stay (HOSPITAL_COMMUNITY): Payer: Self-pay | Admitting: Occupational Therapy

## 2017-04-20 ENCOUNTER — Inpatient Hospital Stay (HOSPITAL_COMMUNITY): Payer: Self-pay

## 2017-04-20 ENCOUNTER — Inpatient Hospital Stay (HOSPITAL_COMMUNITY): Payer: Self-pay | Admitting: Speech Pathology

## 2017-04-20 LAB — GLUCOSE, CAPILLARY
GLUCOSE-CAPILLARY: 98 mg/dL (ref 65–99)
GLUCOSE-CAPILLARY: 81 mg/dL (ref 65–99)
GLUCOSE-CAPILLARY: 171 mg/dL — AB (ref 65–99)
Glucose-Capillary: 154 mg/dL — ABNORMAL HIGH (ref 65–99)

## 2017-04-20 LAB — CBC WITH DIFFERENTIAL/PLATELET
BASOS ABS: 0 10*3/uL (ref 0.0–0.1)
BASOS PCT: 0 %
Eosinophils Absolute: 0.2 10*3/uL (ref 0.0–0.7)
Eosinophils Relative: 4 %
HEMATOCRIT: 26.8 % — AB (ref 39.0–52.0)
HEMOGLOBIN: 8.5 g/dL — AB (ref 13.0–17.0)
LYMPHS PCT: 32 %
Lymphs Abs: 1.4 10*3/uL (ref 0.7–4.0)
MCH: 27.7 pg (ref 26.0–34.0)
MCHC: 31.7 g/dL (ref 30.0–36.0)
MCV: 87.3 fL (ref 78.0–100.0)
Monocytes Absolute: 0.5 10*3/uL (ref 0.1–1.0)
Monocytes Relative: 11 %
NEUTROS ABS: 2.3 10*3/uL (ref 1.7–7.7)
NEUTROS PCT: 53 %
Platelets: 319 10*3/uL (ref 150–400)
RBC: 3.07 MIL/uL — AB (ref 4.22–5.81)
RDW: 17.4 % — ABNORMAL HIGH (ref 11.5–15.5)
WBC: 4.3 10*3/uL (ref 4.0–10.5)

## 2017-04-20 LAB — BASIC METABOLIC PANEL
ANION GAP: 7 (ref 5–15)
BUN: 12 mg/dL (ref 6–20)
CO2: 25 mmol/L (ref 22–32)
Calcium: 8.6 mg/dL — ABNORMAL LOW (ref 8.9–10.3)
Chloride: 108 mmol/L (ref 101–111)
Creatinine, Ser: 1.1 mg/dL (ref 0.61–1.24)
Glucose, Bld: 151 mg/dL — ABNORMAL HIGH (ref 65–99)
POTASSIUM: 3.2 mmol/L — AB (ref 3.5–5.1)
SODIUM: 140 mmol/L (ref 135–145)

## 2017-04-20 MED ORDER — POTASSIUM CHLORIDE CRYS ER 20 MEQ PO TBCR
40.0000 meq | EXTENDED_RELEASE_TABLET | Freq: Two times a day (BID) | ORAL | Status: DC
  Administered 2017-04-20 – 2017-04-27 (×15): 40 meq via ORAL
  Filled 2017-04-20 (×15): qty 2

## 2017-04-20 NOTE — Progress Notes (Signed)
Physical Therapy Weekly Progress Note  Patient Details  Name: Trevor [name] MRN: [id number] Date of Birth: [birth year]  Beginning of progress report period: April 13, 2017 End of progress report period: April 20, 2017  Today's Date: 04/20/2017 PT Individual Time: 8032-1224 AND 1425-1505 PT Individual Time Calculation (min): 30 min AND 40 min   Patient has met 1 of 1 short term goals. Pt making steady progress towards long term goals. Pt has progressed to close supervision assist for bed mobility, sit<>stand and stand pivot transfers with RW. Pt currently able to ambulate up to 46f with RW and close supervision-min assist from PT. Pts cognitive deficits post surgery and visual deficits, prevent increased progress at this time.   Patient continues to demonstrate the following deficits muscle weakness and muscle joint tightness, decreased cardiorespiratoy endurance, decreased visual acuity, decreased attention, decreased awareness, decreased problem solving, decreased safety awareness, decreased memory and delayed processing and decreased sitting balance, decreased standing balance, decreased balance strategies and difficulty maintaining precautions and therefore will continue to benefit from skilled PT intervention to increase functional independence with mobility.  Patient progressing toward long term goals..  Continue plan of care.  PT Short Term Goals Week 1:  PT Short Term Goal 1 (Week 1): STG=LTG due to ELOS  Week 2:    STG =LTG due to ELOS    Skilled Therapeutic Interventions/Progress Updates:   Pt received sitting in WC and agreeable to PT  WC mobility instructed by PT with supervision assist and min cues for obstacel awareness due to  Visual deficits and refusing use of glasses this AM.    PT instructed pt in Standing balance and tolerance with fine motor task to force 1 UE support  2 x 2 minutes. Min-supervision assist from PT for safety. Min cues for proper weight shifting and  posture to prevent LOB to the L.   [facility] to [facility] and all needs  met.     Session 2. Pt received sitting in WC and agreeable to PT.   PT mobility training instructed by PT over level surface in hospital x 2046fwithout cues or assist  From PT. Additional WC mobility over unlevel surface of cemnt sidewalk at hospital entrance x [id number] up/down ramp to Parking garage ~[id number] cues from PT for posture and improved ROM with propulsion using BUE to improve safety, prevent posterior tipping and increase mechanical advantage of each propulsion.   PT instructed pt in Car transfer with supervision assist and Use of RW. Min cues for safety and WC parts management as well as AD management in turn to and from car.   [facility] returned to room and [facility] and all needs met.             Therapy Documentation Precautions:  Precautions Precautions: Fall Required Braces or Orthoses: Other Brace/Splint Restrictions Weight Bearing Restrictions: Yes RLE Weight Bearing: Non weight bearing    Vital Signs: Therapy Vitals Temp: 98.2 F (36.8 C) Temp Source: Oral Pulse Rate: 97 Resp: 18 BP: 104/76 Patient Position (if appropriate): Sitting Oxygen Therapy SpO2: 100 % O2 Device: Room Air Pain: Denies.   See Function Navigator for Current Functional Status.  Therapy/Group: Individual Therapy  AustLorie Phenix1/2019, 2:21 PM

## 2017-04-20 NOTE — Progress Notes (Signed)
Occupational Therapy Weekly Progress Note  Patient Details  Name: Trevor [name] MRN: [id number] Date of Birth: August 04, 1955  Beginning of progress report period: April 13, 2017 End of progress report period: April 20, 2017    Short term goals not set due to estimated length of stay.  Patient is progress towards long term goals.  Pt currently requires min guard with toilet transfer and tub/shower transfers from w/c.  Min guard to close supervision with standing balance for LB bathing and dressing.  Pt currently requires mod cues for sequencing and management of w/c parts and locking brakes for safety.    Patient continues to demonstrate the following deficits: muscle weakness, decreased cardiorespiratoy endurance and decreased sitting balance, decreased standing balance, decreased balance strategies and difficulty maintaining precautions and therefore will continue to benefit from skilled OT intervention to enhance overall performance with BADL and Reduce care partner burden.  See Patient's Care Plan for progression toward long term goals.  Patient progressing toward long term goals..  Continue plan of care.   See Function Navigator for Current Functional Status.    Simonne Come 04/20/2017, 4:26 PM

## 2017-04-20 NOTE — Progress Notes (Signed)
Physical Therapy Session Note  Patient Details  Name: Trevor [name] MRN: [id number] Date of Birth: 18-Oct-1955  Today's Date: 04/20/2017 PT Individual Time: 1300-1330 PT Individual Time Calculation (min): 30 min   Short Term Goals: Week 1:  PT Short Term Goal 1 (Week 1): STG=LTG due to ELOS   Skilled Therapeutic Interventions/Progress Updates:    Focused on functional strengthening of BUE, core musculature, and RLE during w/c propulsion, seated PNF diagnoals with 1kg weighted medicine ball x 10 reps x 2 sets, trunk rotation with weighted medicine ball 1 kg x 10 reps x 2 sets each direction, anterior floor reaches with weighted medicine ball 1 kg x 10 reps x 2 sets, and standing therex for RLE hip flexion/extension/abduction x 10 reps x 2 sets. Pt requires steadying assist for sit <> stand with cues for hand placement when returning to sit for safety. Focused on squat pivot and stand pivot transfers with pt setting up w/c positioning as well as parts management overall supervision to steadying assist for transfers.   Therapy Documentation Precautions:  Precautions Precautions: Fall Required Braces or Orthoses: Other Brace/Splint Restrictions Weight Bearing Restrictions: Yes RLE Weight Bearing: Non weight bearing   Pain:  No complaints.    See Function Navigator for Current Functional Status.   Therapy/Group: Individual Therapy  Canary Brim Ivory Broad, PT, DPT  04/20/2017, 1:35 PM

## 2017-04-20 NOTE — Progress Notes (Signed)
[place] PHYSICAL MEDICINE & REHABILITATION     PROGRESS NOTE  Subjective/Complaints:  Patient seen lying in bed this morning. He states he slept well overnight. He states that he has a questionably cannot think of it. Later notified by nursing regarding niece with several concerns.  ROS: denies CP, SOB, N/V/D.  Objective: Vital Signs: Blood pressure (!) 144/92, pulse 95, temperature 98.2 F (36.8 C), temperature source Oral, resp. rate 18, height 6\' 4"  (1.93 m), weight 130 kg (286 lb 9.6 oz), SpO2 99 %. No results found. Recent Labs    04/20/17 0518  WBC 4.3  HGB 8.5*  HCT 26.8*  PLT 319   Recent Labs    04/19/17 0428 04/20/17 0518  NA  --  140  K  --  3.2*  CL  --  108  GLUCOSE  --  151*  BUN  --  12  CREATININE 1.17 1.10  CALCIUM  --  8.6*   CBG (last 3)  Recent Labs    04/19/17 1648 04/19/17 2104 04/20/17 0629  GLUCAP 92 216* 154*    Wt Readings from Last 3 Encounters:  04/12/17 130 kg (286 lb 9.6 oz)  04/08/17 (!) 138.3 kg (304 lb 14.3 oz)  04/08/17 (!) 137.2 kg (302 lb 8 oz)    Physical Exam:  BP (!) 144/92 (BP Location: Left Arm)   Pulse 95   Temp 98.2 F (36.8 C) (Oral)   Resp 18   Ht 6\' 4"  (1.93 m)   Wt 130 kg (286 lb 9.6 oz)   SpO2 99%   BMI 34.89 kg/m  Constitutional: No distress . Vital signs reviewed. HEENT: EOMI, oral membranes moist Cardiovascular: RRR. No JVD    Respiratory: CTA Bilaterally. Normal effort    GI: BS +, non-distended   Musculoskeletal: Stump less tender Neurological: He is alert and oriented.  Followed simple commands. Motor: B/l UE, LLE 5/5 proximal to distal LLE: 5/5 proximal to distal Right HF: 4+-5/5 (stable) Skin: stump with dressing C/D/I Psychiatric: Alert and appropriate   Assessment/Plan: 1. Functional deficits secondary to right BKA which require 3+ hours per day of interdisciplinary therapy in a comprehensive inpatient rehab setting. Physiatrist is providing close team supervision and 24 hour  management of active medical problems listed below. Physiatrist and rehab team continue to assess barriers to discharge/monitor patient progress toward functional and medical goals.  Function:  Bathing Bathing position   Position: Wheelchair/chair at sink  Bathing parts Body parts bathed by patient: Right arm, Left arm, Chest, Abdomen, Front perineal area, Buttocks, Right upper leg, Left upper leg, Left lower leg Body parts bathed by helper: Back  Bathing assist Assist Level: Touching or steadying assistance(Pt > 75%)      Upper Body Dressing/Undressing Upper body dressing   What is the patient wearing?: Pull over shirt/dress     Pull over shirt/dress - Perfomed by patient: Thread/unthread right sleeve, Thread/unthread left sleeve, Put head through opening, Pull shirt over trunk          Upper body assist Assist Level: Set up   Set up : To obtain clothing/put away  Lower Body Dressing/Undressing Lower body dressing   What is the patient wearing?: Pants, Non-skid slipper socks     Pants- Performed by patient: Thread/unthread right pants leg, Thread/unthread left pants leg, Pull pants up/down   Non-skid slipper socks- Performed by patient: Don/doff left sock  Lower body assist Assist for lower body dressing: Touching or steadying assistance (Pt > 75%)      Toileting Toileting   Toileting steps completed by patient: Performs perineal hygiene, Adjust clothing prior to toileting Toileting steps completed by helper: Adjust clothing after toileting    Toileting assist Assist level: Touching or steadying assistance (Pt.75%)   Transfers Chair/bed transfer   Chair/bed transfer method: Stand pivot Chair/bed transfer assist level: Touching or steadying assistance (Pt > 75%) Chair/bed transfer assistive device: Armrests, Medical sales representative     Max distance: 32 Assist level: Supervision or verbal cues   Wheelchair   Type:  Manual Max wheelchair distance: 110 Assist Level: Supervision or verbal cues  Cognition Comprehension Comprehension assist level: Follows complex conversation/direction with extra time/assistive device  Expression Expression assist level: Expresses complex 90% of the time/cues < 10% of the time  Social Interaction Social Interaction assist level: Interacts appropriately with others with medication or extra time (anti-anxiety, antidepressant).  Problem Solving Problem solving assist level: Solves basic 90% of the time/requires cueing < 10% of the time  Memory Memory assist level: More than reasonable amount of time    Medical Problem List and Plan: 1.  Decreased functional mobility secondary to right transtibial amputation 4/62/7035 complicated by acute encephalopathy related to Cephalosporin toxicity.     Continue CIR   Limb guard to protect incision/leg  2.  DVT Prophylaxis/Anticoagulation: Subcutaneous Lovenox resumed for DVT prophylaxis 3. Pain Management: Tylenol as needed   -Tolerating low-dose tramadol as needed 4. Mood: Provide emotional support 5. Neuropsych: This patient is capable of making decisions on his own behalf. 6. Skin/Wound Care: Routine skin checks 7. Fluids/Electrolytes/Nutrition: Routine I/O's 8.  ID.  Viridans strep bacteremia.  Unasyn after Cephalosporin toxicity completed on 4/10.  9.  Anemia/melena.  Conservative care by GI.  Continue Protonix twice daily   Hemoglobin 8.5 on 4/11   Cont to monitor 10.  Hypertension.  Cozaar 100 mg daily, Procardia 90 mg daily.  Monitor with increased mobility    slightly elevated on 4/11 11.  Diabetes mellitus with peripheral neuropathy.  Hemoglobin A1c 12.5.  Levemir 40 units nightly, NovoLog 10 units 3 times daily.  Check blood sugars before meals and at bedtime.  -Continue with same regimen with sliding scale insulin for coverage   Blade on 4/11, will consider medication adjustments if persistently elevated 12.   Constipation.  Laxative assistance 13.  Legally blind. 14.  CKD stage III.     Creatinine 1.10 on 4/11 15. Hypokalemia   Potassium 3.2 on 4/11  Daily supplement increased again on 4/11  Will order magnesium with next set of labs 16. Hypoalbuminemia  Supplement initiated on 4/4  LOS (Days) 8 A FACE TO FACE EVALUATION WAS PERFORMED  Ankit Lorie Phenix 04/20/2017 8:42 AM

## 2017-04-20 NOTE — Progress Notes (Signed)
Occupational Therapy Session Note  Patient Details  Name: Trevor [name] MRN: [id number] Date of Birth: 03-Nov-1955  Today's Date: 04/20/2017 OT Individual Time: 1100-1200 OT Individual Time Calculation (min): 60 min    Short Term Goals: Week 1:  OT Short Term Goal 1 (Week 1): LTG-STG  Skilled Therapeutic Interventions/Progress Updates:    Treatment session with focus on functional mobility and transfers.  Pt received upright in w/c reporting already dressed and declining bathing/dressing.  Pt willing to engage in functional transfers in ADL apt.  Min guard squat pivot transfers w/c <> tub transfer bench in ADL apt tub/shower.  Pt required increased time for setup of w/c, managing leg rests and brakes with min cues.  Engaged in sit > stand in tub/shower with min guard utilizing grab bars.  Educated on use of grab bars and recommended purchasing grab bars.  Educated on furniture transfers to low couch.  Min guard transfer to low couch, requiring mod assist squat pivot transfer back to w/c due to low surface.  Attempted transfer with RW to assess increased safety, pt reports increased security and confidence with transfer with RW however still requiring mod assist.  Pt propelled w/c up/down ramp x2 with supervision.  Pt left upright in w/c with all needs in reach.  Therapy Documentation Precautions:  Precautions Precautions: Fall Required Braces or Orthoses: Other Brace/Splint Restrictions Weight Bearing Restrictions: Yes RLE Weight Bearing: Non weight bearing Pain:  Pt reports pain 3/10 in residual limb.  Premedicated  See Function Navigator for Current Functional Status.   Therapy/Group: Individual Therapy  Simonne Come 04/20/2017, 12:05 PM

## 2017-04-21 ENCOUNTER — Inpatient Hospital Stay (HOSPITAL_COMMUNITY): Payer: Self-pay

## 2017-04-21 ENCOUNTER — Inpatient Hospital Stay (HOSPITAL_COMMUNITY): Payer: Self-pay | Admitting: Occupational Therapy

## 2017-04-21 ENCOUNTER — Inpatient Hospital Stay (HOSPITAL_COMMUNITY): Payer: Self-pay | Admitting: Physical Therapy

## 2017-04-21 DIAGNOSIS — R143 Flatulence: Secondary | ICD-10-CM

## 2017-04-21 LAB — GLUCOSE, CAPILLARY
GLUCOSE-CAPILLARY: 83 mg/dL (ref 65–99)
GLUCOSE-CAPILLARY: 166 mg/dL — AB (ref 65–99)
Glucose-Capillary: 135 mg/dL — ABNORMAL HIGH (ref 65–99)

## 2017-04-21 MED ORDER — SIMETHICONE 80 MG PO CHEW
80.0000 mg | CHEWABLE_TABLET | Freq: Four times a day (QID) | ORAL | Status: DC
  Administered 2017-04-21 – 2017-04-28 (×29): 80 mg via ORAL
  Filled 2017-04-21 (×29): qty 1

## 2017-04-21 NOTE — Progress Notes (Signed)
[place] PHYSICAL MEDICINE & REHABILITATION     PROGRESS NOTE  Subjective/Complaints:  Patient seen lying in bed this morning. He states he slept well overnight. He states his only complaint is gas and flatulence and requests to medications. He is happy about the fact that he does not have to receive IV antibiotics anymore.  ROS: Denies CP, SOB, N/V/D.  Objective: Vital Signs: Blood pressure (!) 155/87, pulse 92, temperature 98.9 F (37.2 C), temperature source Oral, resp. rate 18, height 6\' 4"  (1.93 m), weight 130 kg (286 lb 9.6 oz), SpO2 99 %. No results found. Recent Labs    04/20/17 0518  WBC 4.3  HGB 8.5*  HCT 26.8*  PLT 319   Recent Labs    04/19/17 0428 04/20/17 0518  NA  --  140  K  --  3.2*  CL  --  108  GLUCOSE  --  151*  BUN  --  12  CREATININE 1.17 1.10  CALCIUM  --  8.6*   CBG (last 3)  Recent Labs    04/20/17 1627 04/20/17 2113 04/21/17 0700  GLUCAP 81 171* 135*    Wt Readings from Last 3 Encounters:  04/12/17 130 kg (286 lb 9.6 oz)  04/08/17 (!) 138.3 kg (304 lb 14.3 oz)  04/08/17 (!) 137.2 kg (302 lb 8 oz)    Physical Exam:  BP (!) 155/87 (BP Location: Left Arm)   Pulse 92   Temp 98.9 F (37.2 C) (Oral)   Resp 18   Ht 6\' 4"  (1.93 m)   Wt 130 kg (286 lb 9.6 oz)   SpO2 99%   BMI 34.89 kg/m  Constitutional: No distress . Vital signs reviewed. HEENT: EOMI, oral membranes moist Cardiovascular: RRR. No JVD    Respiratory: CTA Bilaterally. Normal effort    GI: BS +, non-distended   Musculoskeletal: Stump less tender Neurological: He is alert and oriented.  Followed simple commands. Motor: B/l UE, LLE 5/5 proximal to distal LLE: 5/5 proximal to distal Right HF: 5/5 Skin: stump with dressing C/D/I Psychiatric: Alert and appropriate   Assessment/Plan: 1. Functional deficits secondary to right BKA which require 3+ hours per day of interdisciplinary therapy in a comprehensive inpatient rehab setting. Physiatrist is providing close team  supervision and 24 hour management of active medical problems listed below. Physiatrist and rehab team continue to assess barriers to discharge/monitor patient progress toward functional and medical goals.  Function:  Bathing Bathing position   Position: Wheelchair/chair at sink  Bathing parts Body parts bathed by patient: Right arm, Left arm, Chest, Abdomen, Front perineal area, Buttocks, Right upper leg, Left upper leg, Left lower leg Body parts bathed by helper: Back  Bathing assist Assist Level: Touching or steadying assistance(Pt > 75%)      Upper Body Dressing/Undressing Upper body dressing   What is the patient wearing?: Pull over shirt/dress     Pull over shirt/dress - Perfomed by patient: Thread/unthread right sleeve, Thread/unthread left sleeve, Put head through opening, Pull shirt over trunk          Upper body assist Assist Level: Set up   Set up : To obtain clothing/put away  Lower Body Dressing/Undressing Lower body dressing   What is the patient wearing?: Pants, Non-skid slipper socks     Pants- Performed by patient: Thread/unthread right pants leg, Thread/unthread left pants leg, Pull pants up/down   Non-skid slipper socks- Performed by patient: Don/doff left sock  Lower body assist Assist for lower body dressing: Touching or steadying assistance (Pt > 75%)      Toileting Toileting   Toileting steps completed by patient: Performs perineal hygiene, Adjust clothing prior to toileting Toileting steps completed by helper: Adjust clothing after toileting    Toileting assist Assist level: Touching or steadying assistance (Pt.75%)   Transfers Chair/bed transfer   Chair/bed transfer method: Squat pivot Chair/bed transfer assist level: Supervision or verbal cues Chair/bed transfer assistive device: Armrests     Locomotion Ambulation     Max distance: 32 Assist level: Supervision or verbal cues   Wheelchair   Type: Manual Max  wheelchair distance: 130ft  Assist Level: Supervision or verbal cues  Cognition Comprehension Comprehension assist level: Follows complex conversation/direction with extra time/assistive device  Expression Expression assist level: Expresses complex 90% of the time/cues < 10% of the time  Social Interaction Social Interaction assist level: Interacts appropriately with others with medication or extra time (anti-anxiety, antidepressant).  Problem Solving Problem solving assist level: Solves basic 90% of the time/requires cueing < 10% of the time  Memory Memory assist level: More than reasonable amount of time    Medical Problem List and Plan: 1.  Decreased functional mobility secondary to right transtibial amputation 0/34/7425 complicated by acute encephalopathy related to Cephalosporin toxicity.     Continue CIR   Limb guard to protect incision/leg  2.  DVT Prophylaxis/Anticoagulation: Subcutaneous Lovenox resumed for DVT prophylaxis 3. Pain Management: Tylenol as needed   -Tolerating low-dose tramadol as needed 4. Mood: Provide emotional support 5. Neuropsych: This patient is capable of making decisions on his own behalf. 6. Skin/Wound Care: Routine skin checks 7. Fluids/Electrolytes/Nutrition: Routine I/O's 8.  ID.  Viridans strep bacteremia.  Unasyn after Cephalosporin toxicity completed on 4/10.  9.  Anemia/melena.  Conservative care by GI.  Continue Protonix twice daily   Hemoglobin 8.5 on 4/11  Labs ordered for Monday   Cont to monitor 10.  Hypertension.  Cozaar 100 mg daily, Procardia 90 mg daily.  Monitor with increased mobility    labile on 4/12, will consider medications if persistently elevated 11.  Diabetes mellitus with peripheral neuropathy.  Hemoglobin A1c 12.5.  Levemir 40 units nightly, NovoLog 10 units 3 times daily.  Check blood sugars before meals and at bedtime.  -Continue with same regimen with sliding scale insulin for coverage   Labile on 4/12, will consider  medication adjustments if persistently elevated 12.  Constipation.  Laxative assistance 13.  Legally blind. 14.  CKD stage III.     Creatinine 1.10 on 4/11 15. Hypokalemia   Potassium 3.2 on 4/11  Daily supplement increased again on 4/11  Labs ordered for Monday  Will order magnesium with next set of labs 16. Hypoalbuminemia  Supplement initiated on 4/4 17. Gas  Simethicone Scheduled on 4/12  LOS (Days) 9 A FACE TO FACE EVALUATION WAS PERFORMED  Kellan Boehlke Lorie Phenix 04/21/2017 8:15 AM

## 2017-04-21 NOTE — Progress Notes (Signed)
Occupational Therapy Session Note  Patient Details  Name: Trevor [name] MRN: [id number] Date of Birth: 09/09/1955  Today's Date: 04/21/2017 OT Individual Time: 1000-1057 OT Individual Time Calculation (min): 57 min   Short Term Goals: Week 2:  OT Short Term Goal 1 (Week 2): STG = LTGs due to remaining LOS  Skilled Therapeutic Interventions/Progress Updates:    Pt greeted semi-reclined in bed and agreeable to OT. Pt came to sitting EOB with supervision. OT set-up wc and OT raised bed 2/2 pt height. Pt completed squat-pivot transfer bed>wc with close supervision/min guard A. Pt brought to the sink and was set-up for bathing tasks. Pt completed UB bathing seated in wc without OT assist. Pt very particular about how he does his BADL tasks. Pt able to achieve figure 4 position seated in wc to wash L foot and place non-skid sock. Sit<>stand with close supervision, and continued supervision while reaching to wash buttocks in dynamic standing. Pt uses sink as hip support to maintain balance. Pt able to thread pants and stand to pull them up supervision. Pt declined to remain standing for toothbrushing task stating "I do enough of that in PT." Grooming tasks completed sitting in wc. Pt left seated in wc at end of session with needs met.  Therapy Documentation Precautions:  Precautions Precautions: Fall Required Braces or Orthoses: Other Brace/Splint Restrictions Weight Bearing Restrictions: Yes RLE Weight Bearing: Non weight bearing Pain: Pain Assessment Pain Scale: 0-10 Pain Score: 0-No pain ADL: ADL ADL Comments: see functional navigator  See Function Navigator for Current Functional Status.   Therapy/Group: Individual Therapy  Valma Cava 04/21/2017, 10:22 AM

## 2017-04-21 NOTE — Progress Notes (Signed)
Speech Language Pathology Daily Session Note  Patient Details  Name: Trevor [name] MRN: [id number] Date of Birth: 1955/11/15  Today's Date: 04/21/2017 SLP Individual Time: 0800-0900 SLP Individual Time Calculation (min): 60 min  Short Term Goals: Week 2: SLP Short Term Goal 1 (Week 2): continue working towards mod I LTG  Skilled Therapeutic Interventions: Skilled ST services focused on speech skills. SLP facilitated word finding and recall skills in novel task, Call it. Pt demonstrated ability to create appropriate word association with object at Mod I demonstrating problem solving skills. Pt demonstrated recall of basic associations, category name and symbol at Mod I level and required supervision A verbal cues for delayed recall of 5-10 previously listed category members. SLP instructed pt in word finding and recall strategies including associations and categorizing, pt returned demonstration utilizing strategies for word finding in divergent naming task, requiring extra time and supervision A verbal cues. Pt was left in room with call bell within reach. Recommend to continue skilled ST services.      Function:  Eating Eating                 Cognition Comprehension Comprehension assist level: Follows complex conversation/direction with extra time/assistive device  Expression   Expression assist level: Expresses complex 90% of the time/cues < 10% of the time  Social Interaction Social Interaction assist level: Interacts appropriately with others with medication or extra time (anti-anxiety, antidepressant).  Problem Solving Problem solving assist level: Solves basic problems with no assist  Memory Memory assist level: More than reasonable amount of time    Pain Pain Assessment Pain Score: 0-No pain  Therapy/Group: Individual Therapy  Shenell Rogalski  [facility] 04/21/2017, 12:58 PM

## 2017-04-21 NOTE — Progress Notes (Signed)
Physical Therapy Session Note  Patient Details  Name: Trevor [name] MRN: [id number] Date of Birth: [birth year]  Today's Date: 04/21/2017 PT Individual Time: 1102-1200 PT Individual Time Calculation (min): 58 min   Short Term Goals: Week 1:  PT Short Term Goal 1 (Week 1): STG=LTG due to ELOS   Skilled Therapeutic Interventions/Progress Updates:   Pt received sitting in WC and agreeable to PT  WC mobility instructed by PT in various environments including through hall of hospital x 247f and through simulated community environment of hospital gift gift shop x 1523f Supervision assist through gift shop with min cues for technique to reduce turning radius. Distant supervision in hall of hospital with cues for obstacles due to visual impairments.   Squat pivot transfer to mat table with distant supervision assist from PT safety. Pt able to appropriately manage WC parts with significant increase in time. Cues frmo PT folowing transfer for removal of arm rest prior to reduced risk of skin tear.   Sit<>supine transfer without cues or assist from PT, but increased time required to come to sitting.   PT instructed pt in supine therex:  SAQ, SLR, quad sets, glute sets, sidelying hip abduction, sidelying hip extension All completed x 15 BLE with min assist to prevent compensation through trunk and improve activation of target musculature.   [facility] to [facility] and [facility] and all needs met.         Therapy Documentation Precautions:  Precautions Precautions: Fall Required Braces or Orthoses: Other Brace/Splint Restrictions Weight Bearing Restrictions: Yes RLE Weight Bearing: Non weight bearing Pain: Pain Assessment Pain Scale: 0-10 Pain Score: 0-No pain   See Function Navigator for Current Functional Status.   Therapy/Group: Individual Therapy  AuLorie Phenix/12/2017, 12:01 PM

## 2017-04-21 NOTE — Progress Notes (Signed)
Speech Language Pathology Weekly Progress and Session Note  Patient Details  Name: Trevor [name] MRN: [id number] Date of Birth: Oct 14, 1955  Beginning of progress report period: April 14, 2017 End of progress report period: April 20, 2017   Today's Date: 04/21/2017 SLP Individual Time: 9030- 1330     Short Term Goals: Week 1: SLP Short Term Goal 1 (Week 1): STG=LTG due to ELOS    New Short Term Goals: Week 2: SLP Short Term Goal 1 (Week 2): continue working towards mod I LTG  Weekly Progress Updates:   Pt has made functional gains this reporting period and is progressing well towards Mod I LTG.  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  Pt has demonstrated improved processing speed and use of word finding strategies.  Pt reports he is ~80% back to his baseline level of function   Pt education is ongoing.  Pt would continue to benefit from skilled ST while inpatient in order to maximize functional independence and reduce burden of care prior to discharge.   Do not anticipate that pt will need ST follow up at next level of care.      Intensity: Minumum of 1-2 x/day, 30 to 90 minutes Frequency: 3 to 5 out of 7 daysd Duration/Length of Stay: 7-10 days  Treatment/Interventions: Cognitive remediation/compensation;Cueing hierarchy;Internal/external aids;Multimodal communication approach;Patient/family education   Daily Session  Skilled Therapeutic Interventions: Pt was seen for skilled ST targeting language goals.  SLP facilitated the session with meal planning task to address word finding.  Pt called in his dinner order over the phone with mod I.  At the end of today's therapy session, pt requested to use the bathroom appropriately and demonstrated good use of safety precautions when transferring onto bedside commode over toilet seat using grab bars.  Pt was left on commode with pull string within reach.   Nurse tech made aware.  Continue per current plan of care.         Function:   Eating Eating                 Cognition Comprehension Comprehension assist level: Follows complex conversation/direction with extra time/assistive device  Expression   Expression assist level: Expresses complex 90% of the time/cues < 10% of the time  Social Interaction Social Interaction assist level: Interacts appropriately with others with medication or extra time (anti-anxiety, antidepressant).  Problem Solving Problem solving assist level: Solves basic 90% of the time/requires cueing < 10% of the time  Memory Memory assist level: More than reasonable amount of time   General    Pain Pain Assessment Pain Scale: 0-10 Pain Score: 0-No pain  Therapy/Group: Individual Therapy  Rae Plotner, Selinda Orion 04/21/2017, 6:54 AM

## 2017-04-21 NOTE — Progress Notes (Signed)
Physical Therapy Session Note  Patient Details  Name: Trevor [name] MRN: [id number] Date of Birth: 11-Oct-1955  Today's Date: 04/21/2017 PT Individual Time: 1400-1430 PT Individual Time Calculation (min): 30 min   Short Term Goals: Week 1:  PT Short Term Goal 1 (Week 1): STG=LTG due to ELOS   Skilled Therapeutic Interventions/Progress Updates:    Pt supine in bed, agreeable to PT. No complaints of pain. Supine to sit Mod I with use of bedrails. Sit to stand with Supervision to RW. Standing RLE therex: hip flex, marches, hip abd, hip ext x 30 reps each. Seated RLE therex: LAQ, marches. Discussed pt's discharge plan and options for once he leaves the hospital. Pt disappointed he is unable to safely d/c home but states his understanding of the situation. Pt left seated EOB with friend in room visiting, needs in reach.  Therapy Documentation Precautions:  Precautions Precautions: Fall Required Braces or Orthoses: Other Brace/Splint Restrictions Weight Bearing Restrictions: Yes RLE Weight Bearing: Non weight bearing  See Function Navigator for Current Functional Status.   Therapy/Group: Individual Therapy  Excell Seltzer, PT, DPT  04/21/2017, 3:15 PM

## 2017-04-21 NOTE — Progress Notes (Signed)
Social Work Patient ID: Trevor [name], male   DOB: [birth year], 62 y.o.   MRN: [id number]   Met with pt and spoke with niece, Trevor [name], yesterday to review team conference.  Both aware that team had targeted a d/c date of 4/16 and supervision goals.  Both state that family cannot provide the needed assistance and insistent that the d/c plan needs to be changed to SNF.  Discussed fact that pt is currently uninsured and, IF I can proceed with SNF placment (via LOG if approved) then he may end up in facility far from home.  They state they understand but both feel this is needed plan.  Will follow up with rehab director about this situation and see if we can get LOG ok'd in order to proceed with SNF placement.  Tena Linebaugh, LCSW

## 2017-04-21 NOTE — Patient Care Conference (Signed)
Inpatient RehabilitationTeam Conference and Plan of Care Update Date: 04/19/2017   Time: 2:55 PM    Patient Name: Trevor [name]      Medical Record Number: [id number]  Date of Birth: 11/14/1955 Sex: Male         Room/Bed: 4M01C/4M01C-01 Payor Info: Payor: MEDICAID POTENTIAL / Plan: MEDICAID POTENTIAL / Product Type: *No Product type* /    Admitting Diagnosis: BKA  Admit Date/Time:  04/12/2017  6:40 PM Admission Comments: No comment available   Primary Diagnosis:  Unilateral complete BKA, right, sequela (HCC) Principal Problem: Unilateral complete BKA, right, sequela [facility])  Patient Active Problem List   Diagnosis Date Noted  . Flatulence   . Hypomagnesemia   . Unilateral complete BKA, right, sequela ([place])   . Benign essential HTN   . Hypoalbuminemia due to protein-calorie malnutrition ([place])   . S/P below knee amputation, right ([place]) 04/12/2017  . Acute blood loss anemia   . Other encephalopathy 04/08/2017  . Encephalopathy 04/08/2017  . Altered mental status 04/08/2017  . Labile blood pressure   . Labile blood glucose   . Drug induced constipation   . Stage 3 chronic kidney disease ([place])   . Bacteremia   . S/P unilateral BKA (below knee amputation), right ([place]) 04/04/2017  . PAD (peripheral artery disease) ([place])   . Type 2 diabetes mellitus with right diabetic foot ulcer ([place])   . Post-operative pain   . Legally blind   . Upper GI bleed   . Streptococcal bacteremia 04/01/2017  . Hypokalemia 03/30/2017  . Uncontrolled type 2 diabetes mellitus with hyperglycemia, with long-term current use of insulin ([place]) 03/30/2017  . Type 2 diabetes mellitus with peripheral neuropathy ([place]) 03/30/2017  . AKI (acute kidney injury) ([place]) 03/30/2017  . CKD stage 3 due to type 2 diabetes mellitus ([place]) 03/30/2017  . Sepsis ([place]) 03/30/2017  . Heart murmur 11/22/2016  . Hyperlipidemia associated with type 2 diabetes mellitus ([place]) 11/22/2016  . Obstructive sleep apnea syndrome 11/22/2016   . Essential hypertension 12/17/2012    Expected Discharge Date: Expected Discharge Date: 04/25/17  Team Members Present: Physician leading conference: Dr. Delice Lesch Social Worker Present: Lennart Pall, LCSW Nurse Present: Dorthula Nettles, RN PT Present: Barrie Folk, PT OT Present: Simonne Come, OT SLP Present: Windell Moulding, SLP PPS Coordinator present : Ileana Ladd, PT     Current Status/Progress Goal Weekly Team Focus  Medical   Decreased functional mobility secondary to right transtibial amputation 5/42/7062 complicated by acute encephalopathy related to Cephalosporin toxicity  Improve mobility, transfers, bacteremia, ABLA, DM, hypokalemia  See above   Bowel/Bladder   LBM 04/18/2017; Continent of bowel and bladder   To remain continent of bowel and bladder   Continue to monitor and give prn medication when needed    Swallow/Nutrition/ Hydration             ADL's   min guard stand pivot/squat pivot transfers, supervision/setup bathing and dressing at sink  Supervision/setup  ADL retraining, activity tolerance, dynamic standing balance, transfers   Mobility   Supervision assist bed mobililty. supervision-min assist Stand pivot and squat pivot trasnfers. Pt able to ambualte up to 31ft with min-supervision assist and RW.   supervision assist gait and transfers. mod I bed mobility, Mod I wc mobility up to 147ft    improved endurance, safety awareness with transfers and improved independence with with gait at house hold distances.    Communication   supervision for word finding   mod I   education  and carryover of compensatory strategies    Safety/Cognition/ Behavioral Observations  supervision for complex  mod I   memory, problem solving    Pain   No c/o pain at this time`  Pain level stay at or below 2(1-10)   Continue to assess pain every shift and give prn medications as indicated    Skin   Right BKA with sutures and staples no drainage   To remain free of infection  Assess  on each shift and prn; notify MD of any changes     Rehab Goals Patient on target to meet rehab goals: Yes *See Care Plan and progress notes for long and short-term goals.     Barriers to Discharge  Current Status/Progress Possible Resolutions Date Resolved   Physician    Medical stability;Wound Care;Weight bearing restrictions     See above  Therapies, follow labs, IV abx to be completed today, optimize HTN/DM meds      Nursing                  PT                    OT                  SLP                SW                Discharge Planning/Teaching Needs:  Family only able to provide intermittent support - will discuss further with neice.  TBD   Team Discussion:  Continue to adjust DM meds;  abx end today and will be cleared for showers.  Min-guard for transfers but very poor safety awareness.  Min-mod cues for safe set-up for transfers.  Supervision for bed mobility;  Amb 30'with min -guard.  Slow cognition;  Still with some word finding issues.  Recommend 24/7 supervision. SW to follow up with pt and niece about d/c plan.  Revisions to Treatment Plan:  None    Continued Need for Acute Rehabilitation Level of Care: The patient requires daily medical management by a physician with specialized training in physical medicine and rehabilitation for the following conditions: Daily direction of a multidisciplinary physical rehabilitation program to ensure safe treatment while eliciting the highest outcome that is of practical value to the patient.: Yes Daily medical management of patient stability for increased activity during participation in an intensive rehabilitation regime.: Yes Daily analysis of laboratory values and/or radiology reports with any subsequent need for medication adjustment of medical intervention for : Post surgical problems;Wound care problems;Blood pressure problems;Renal problems;Diabetes problems  Rachelle Edwards, [place] 04/21/2017, 1:49 PM

## 2017-04-22 LAB — GLUCOSE, CAPILLARY
GLUCOSE-CAPILLARY: 208 mg/dL — AB (ref 65–99)
Glucose-Capillary: 97 mg/dL (ref 65–99)
Glucose-Capillary: 251 mg/dL — ABNORMAL HIGH (ref 65–99)
Glucose-Capillary: 225 mg/dL — ABNORMAL HIGH (ref 65–99)

## 2017-04-22 NOTE — Progress Notes (Signed)
[place] PHYSICAL MEDICINE & REHABILITATION     PROGRESS NOTE  Subjective/Complaints:  Patient seen lying in bed this morning. He states he slept well overnight. He states his gas has improved. He is also pleased with the fact that his PICC line was DC'd.  ROS: denies CP, SOB, N/V/D.  Objective: Vital Signs: Blood pressure (!) 141/96, pulse 91, temperature 98.9 F (37.2 C), temperature source Oral, resp. rate 18, height 6\' 4"  (1.93 m), weight 130 kg (286 lb 9.6 oz), SpO2 100 %. No results found. Recent Labs    04/20/17 0518  WBC 4.3  HGB 8.5*  HCT 26.8*  PLT 319   Recent Labs    04/20/17 0518  NA 140  K 3.2*  CL 108  GLUCOSE 151*  BUN 12  CREATININE 1.10  CALCIUM 8.6*   CBG (last 3)  Recent Labs    04/21/17 1659 04/21/17 2145 04/22/17 0527  GLUCAP 83 166* 208*    Wt Readings from Last 3 Encounters:  04/12/17 130 kg (286 lb 9.6 oz)  04/08/17 (!) 138.3 kg (304 lb 14.3 oz)  04/08/17 (!) 137.2 kg (302 lb 8 oz)    Physical Exam:  BP (!) 141/96 (BP Location: Left Arm)   Pulse 91   Temp 98.9 F (37.2 C) (Oral)   Resp 18   Ht 6\' 4"  (1.93 m)   Wt 130 kg (286 lb 9.6 oz)   SpO2 100%   BMI 34.89 kg/m  Constitutional: No distress . Vital signs reviewed. HEENT: EOMI, oral membranes moist Cardiovascular: RRR. No JVD    Respiratory: CTA Bilaterally. Normal effort    GI: BS +, non-distended   Musculoskeletal: Stump less tender Neurological: He is alert and oriented.  Followed simple commands. Motor: B/l UE, LLE 5/5 proximal to distal LLE: 5/5 proximal to distal (stable) Right HF: 5/5 (stable) Skin: stump with dressing C/D/I Psychiatric: Alert and appropriate   Assessment/Plan: 1. Functional deficits secondary to right BKA which require 3+ hours per day of interdisciplinary therapy in a comprehensive inpatient rehab setting. Physiatrist is providing close team supervision and 24 hour management of active medical problems listed below. Physiatrist and rehab  team continue to assess barriers to discharge/monitor patient progress toward functional and medical goals.  Function:  Bathing Bathing position   Position: Wheelchair/chair at sink  Bathing parts Body parts bathed by patient: Right arm, Left arm, Chest, Abdomen, Front perineal area, Buttocks, Right upper leg, Left upper leg, Left lower leg Body parts bathed by helper: Back  Bathing assist Assist Level: Supervision or verbal cues      Upper Body Dressing/Undressing Upper body dressing   What is the patient wearing?: Pull over shirt/dress     Pull over shirt/dress - Perfomed by patient: Thread/unthread left sleeve, Thread/unthread right sleeve, Put head through opening, Pull shirt over trunk          Upper body assist Assist Level: Set up   Set up : To obtain clothing/put away  Lower Body Dressing/Undressing Lower body dressing   What is the patient wearing?: Pants, Non-skid slipper socks     Pants- Performed by patient: Thread/unthread right pants leg, Pull pants up/down, Thread/unthread left pants leg   Non-skid slipper socks- Performed by patient: Don/doff left sock                    Lower body assist Assist for lower body dressing: Supervision or verbal cues      Child psychotherapist  steps completed by patient: Performs perineal hygiene, Adjust clothing prior to toileting Toileting steps completed by helper: Adjust clothing after toileting    Toileting assist Assist level: Touching or steadying assistance (Pt.75%)   Transfers Chair/bed transfer   Chair/bed transfer method: Squat pivot, Stand pivot Chair/bed transfer assist level: Supervision or verbal cues Chair/bed transfer assistive device: Armrests, Medical sales representative     Max distance: 32 Assist level: Supervision or verbal cues   Wheelchair   Type: Manual Max wheelchair distance: 265ft Assist Level: No help, No cues, assistive device, takes more than reasonable  amount of time  Cognition Comprehension Comprehension assist level: Follows complex conversation/direction with extra time/assistive device  Expression Expression assist level: Expresses complex 90% of the time/cues < 10% of the time  Social Interaction Social Interaction assist level: Interacts appropriately with others with medication or extra time (anti-anxiety, antidepressant).  Problem Solving Problem solving assist level: Solves basic problems with no assist  Memory Memory assist level: More than reasonable amount of time    Medical Problem List and Plan: 1.  Decreased functional mobility secondary to right transtibial amputation 3/49/1791 complicated by acute encephalopathy related to Cephalosporin toxicity.     Continue CIR   Limb guard to protect incision/leg  2.  DVT Prophylaxis/Anticoagulation: Subcutaneous Lovenox resumed for DVT prophylaxis 3. Pain Management: Tylenol as needed   Tolerating low-dose tramadol as needed 4. Mood: Provide emotional support 5. Neuropsych: This patient is capable of making decisions on his own behalf. 6. Skin/Wound Care: Routine skin checks 7. Fluids/Electrolytes/Nutrition: Routine I/O's 8.  ID.  Viridans strep bacteremia.  Unasyn after Cephalosporin toxicity completed on 4/10.  9.  Anemia/melena.  Conservative care by GI.  Continue Protonix twice daily   Hemoglobin 8.5 on 4/11  Labs ordered for Monday   Cont to monitor 10.  Hypertension.  Cozaar 100 mg daily, Procardia 90 mg daily.  Monitor with increased mobility   Relatively controlled on 4/13 11.  Diabetes mellitus with peripheral neuropathy.  Hemoglobin A1c 12.5.  Levemir 40 units nightly, NovoLog 10 units 3 times daily.  Check blood sugars before meals and at bedtime.  -Continue with same regimen with sliding scale insulin for coverage   Labile on 4/13, will consider medication adjustments if persistently elevated 12.  Constipation.  Laxative assistance 13.  Legally blind. 14.  CKD stage  III.     Creatinine 1.10 on 4/11 15. Hypokalemia   Potassium 3.2 on 4/11  Daily supplement increased again on 4/11  Labs ordered for Monday  Magnesium ordered for Monday 16. Hypoalbuminemia  Supplement initiated on 4/4 17. Gas  Simethicone Scheduled on 4/12  LOS (Days) 10 A FACE TO FACE EVALUATION WAS PERFORMED  Ankit Lorie Phenix 04/22/2017 10:41 AM

## 2017-04-23 ENCOUNTER — Inpatient Hospital Stay (HOSPITAL_COMMUNITY): Payer: Self-pay

## 2017-04-23 DIAGNOSIS — E1142 Type 2 diabetes mellitus with diabetic polyneuropathy: Secondary | ICD-10-CM

## 2017-04-23 DIAGNOSIS — R143 Flatulence: Secondary | ICD-10-CM

## 2017-04-23 DIAGNOSIS — E1165 Type 2 diabetes mellitus with hyperglycemia: Secondary | ICD-10-CM

## 2017-04-23 LAB — GLUCOSE, CAPILLARY
GLUCOSE-CAPILLARY: 137 mg/dL — AB (ref 65–99)
GLUCOSE-CAPILLARY: 186 mg/dL — AB (ref 65–99)
Glucose-Capillary: 110 mg/dL — ABNORMAL HIGH (ref 65–99)
Glucose-Capillary: 135 mg/dL — ABNORMAL HIGH (ref 65–99)

## 2017-04-23 MED ORDER — INSULIN ASPART 100 UNIT/ML ~~LOC~~ SOLN
13.0000 [IU] | Freq: Three times a day (TID) | SUBCUTANEOUS | Status: DC
  Administered 2017-04-23 – 2017-04-25 (×7): 13 [IU] via SUBCUTANEOUS

## 2017-04-23 MED ORDER — INSULIN DETEMIR 100 UNIT/ML ~~LOC~~ SOLN
30.0000 [IU] | Freq: Every day | SUBCUTANEOUS | Status: DC
  Administered 2017-04-23 – 2017-04-24 (×2): 30 [IU] via SUBCUTANEOUS
  Filled 2017-04-23 (×2): qty 0.3

## 2017-04-23 MED ORDER — CHLORTHALIDONE 25 MG PO TABS
12.5000 mg | ORAL_TABLET | Freq: Every day | ORAL | Status: DC
  Administered 2017-04-23 – 2017-04-28 (×6): 12.5 mg via ORAL
  Filled 2017-04-23 (×6): qty 0.5

## 2017-04-23 NOTE — Progress Notes (Signed)
Physical Therapy Session Note  Patient Details  Name: Trevor [name] MRN: [id number] Date of Birth: 15-Jul-1955  Today's Date: 04/23/2017 PT Individual Time: 0945-1030 PT Individual Time Calculation (min): 45 min   Short Term Goals: Week 1:  PT Short Term Goal 1 (Week 1): STG=LTG due to ELOS   Skilled Therapeutic Interventions/Progress Updates:    Pt seated in w/c upon PT arrival, agreeable to therapy tx and denies pain. Pt propels w/c from room<>gym x 150 ft each direction Mod I using B UEs. Pt worked on dynamic standing balance this session with single UE support to complete peg board puzzle and to play checkers, supervision. Pt performed therex for LE strengthening, 2 x 10 each: seated LAQ, R LE standing hip abduction/flexion/extension. Pt transferred from w/c<>mat squat pivot with supervision. Pt transferred to prone with supervision and verbal cues, pt maintained prone press up on elbows x 5 minutes for hip flexor stretch. In prone pt performed 2 x 10 hip extension and hamstring curls with B LEs for strengthening. Pt transferred back to sitting and transferred to w/c. Pt propelled w/c back to room Mod I and left seated with needs in reach, QRB in place.   Therapy Documentation Precautions:  Precautions Precautions: Fall Required Braces or Orthoses: Other Brace/Splint Restrictions Weight Bearing Restrictions: Yes RLE Weight Bearing: Non weight bearing   See Function Navigator for Current Functional Status.   Therapy/Group: Individual Therapy  Netta Corrigan, PT, DPT 04/23/2017, 7:41 AM

## 2017-04-23 NOTE — Progress Notes (Signed)
Occupational Therapy Session Note  Patient Details  Name: Trevor [name] MRN: [id number] Date of Birth: 06/06/1955  Today's Date: 04/23/2017 OT Individual Time: 1500-1545 OT Individual Time Calculation (min): 45 min    Short Term Goals: Week 1:  OT Short Term Goal 1 (Week 1): LTG-STG OT Short Term Goal 1 - Progress (Week 1): Progressing toward goal  Skilled Therapeutic Interventions/Progress Updates:     1:1. Pt asleep upon arrival with increased time to arouse and initiate getting up to EOB. Pt completes stand pivot transfer with RW EOB<>w/c with supervision and set up of w/c. Pt requires increased time to process cues and set up throughout session. Pt propels w/c to/from dayroom iwith MOD I to improve BUE strength. PT plays wii bowling in seated with max fading to min cueing for demonstration of VC for use of remote to play game for cognition. Exited session with pt seated in bed/exit alarm on, call light in reach and all needs met.   Therapy Documentation Precautions:  Precautions Precautions: Fall Required Braces or Orthoses: Other Brace/Splint Restrictions Weight Bearing Restrictions: Yes RLE Weight Bearing: Non weight bearing General:   Vital Signs: Therapy Vitals Temp: 99.2 F (37.3 C) Temp Source: Oral Pulse Rate: 87 Resp: 16 BP: (!) 153/98 Patient Position (if appropriate): Lying Oxygen Therapy SpO2: 98 % O2 Device: Room Air Pain:   See Function Navigator for Current Functional Status.   Therapy/Group: Individual Therapy  Tonny Branch 04/23/2017, 3:50 PM

## 2017-04-23 NOTE — Progress Notes (Signed)
[place] PHYSICAL MEDICINE & REHABILITATION     PROGRESS NOTE  Subjective/Complaints:  Patient seen lying in bed this morning. [name] states [name] slept well overnight. [name] states [name] enjoyed his day of rest yesterday.  ROS: denies CP, SOB, N/V/D.  Objective: Vital Signs: Blood pressure (!) 154/94, pulse 93, temperature 98.2 F (36.8 C), temperature source Oral, resp. rate 18, height 6\' 4"  (1.93 m), weight 130 kg (286 lb 9.6 oz), SpO2 99 %. No results found. No results for input(s): WBC, HGB, HCT, PLT in the last 72 hours. No results for input(s): NA, K, CL, GLUCOSE, BUN, CREATININE, CALCIUM in the last 72 hours.  Invalid input(s): CO CBG (last 3)  Recent Labs    04/22/17 1647 04/22/17 2115 04/23/17 0642  GLUCAP 251* 225* 110*    Wt Readings from Last 3 Encounters:  04/12/17 130 kg (286 lb 9.6 oz)  04/08/17 (!) 138.3 kg (304 lb 14.3 oz)  04/08/17 (!) 137.2 kg (302 lb 8 oz)    Physical Exam:  BP (!) 154/94 (BP Location: Left Arm)   Pulse 93   Temp 98.2 F (36.8 C) (Oral)   Resp 18   Ht 6\' 4"  (1.93 m)   Wt 130 kg (286 lb 9.6 oz)   SpO2 99%   BMI 34.89 kg/m  Constitutional: No distress . Vital signs reviewed. HEENT: EOMI, oral membranes moist Cardiovascular: RRR. No JVD    Respiratory: CTA Bilaterally. Normal effort    GI: BS +, non-distended   Musculoskeletal: Stump less tender Neurological: [name] is alert and oriented.  Followed simple commands. Motor: B/l UE, LLE 5/5 proximal to distal LLE: 5/5 proximal to distal (unchanged) Right HF: 5/5 (unchanged) Skin: stump with dressing C/D/I Psychiatric: Alert and appropriate   Assessment/Plan: 1. Functional deficits secondary to right BKA which require 3+ hours per day of interdisciplinary therapy in a comprehensive inpatient rehab setting. Physiatrist is providing close team supervision and 24 hour management of active medical problems listed below. Physiatrist and rehab team continue to assess barriers to discharge/monitor  patient progress toward functional and medical goals.  Function:  Bathing Bathing position   Position: Wheelchair/chair at sink  Bathing parts Body parts bathed by patient: Right arm, Left arm, Chest, Abdomen, Front perineal area, Buttocks, Right upper leg, Left upper leg, Left lower leg Body parts bathed by helper: Back  Bathing assist Assist Level: Supervision or verbal cues      Upper Body Dressing/Undressing Upper body dressing   What is the patient wearing?: Pull over shirt/dress     Pull over shirt/dress - Perfomed by patient: Thread/unthread left sleeve, Thread/unthread right sleeve, Put head through opening, Pull shirt over trunk          Upper body assist Assist Level: Set up   Set up : To obtain clothing/put away  Lower Body Dressing/Undressing Lower body dressing   What is the patient wearing?: Pants, Non-skid slipper socks     Pants- Performed by patient: Thread/unthread right pants leg, Pull pants up/down, Thread/unthread left pants leg   Non-skid slipper socks- Performed by patient: Don/doff left sock                    Lower body assist Assist for lower body dressing: Supervision or verbal cues      Toileting Toileting   Toileting steps completed by patient: Adjust clothing prior to toileting, Performs perineal hygiene, Adjust clothing after toileting Toileting steps completed by helper: Adjust clothing after toileting Toileting Assistive Devices:  Grab bar or rail  Toileting assist Assist level: More than reasonable time   Transfers Chair/bed transfer   Chair/bed transfer method: Squat pivot, Stand pivot Chair/bed transfer assist level: Supervision or verbal cues Chair/bed transfer assistive device: Armrests, Medical sales representative     Max distance: 32 Assist level: Supervision or verbal cues   Wheelchair   Type: Manual Max wheelchair distance: 294ft Assist Level: No help, No cues, assistive device, takes more than  reasonable amount of time  Cognition Comprehension Comprehension assist level: Follows complex conversation/direction with extra time/assistive device  Expression Expression assist level: Expresses complex 90% of the time/cues < 10% of the time  Social Interaction Social Interaction assist level: Interacts appropriately with others with medication or extra time (anti-anxiety, antidepressant).  Problem Solving Problem solving assist level: Solves basic problems with no assist  Memory Memory assist level: More than reasonable amount of time    Medical Problem List and Plan: 1.  Decreased functional mobility secondary to right transtibial amputation 2/97/9892 complicated by acute encephalopathy related to Cephalosporin toxicity.     Continue CIR   Limb guard to protect incision/leg  2.  DVT Prophylaxis/Anticoagulation: Subcutaneous Lovenox resumed for DVT prophylaxis 3. Pain Management: Tylenol as needed   Tolerating low-dose tramadol as needed 4. Mood: Provide emotional support 5. Neuropsych: This patient is capable of making decisions on his own behalf. 6. Skin/Wound Care: Routine skin checks 7. Fluids/Electrolytes/Nutrition: Routine I/O's 8.  ID.  Viridans strep bacteremia.  Unasyn after Cephalosporin toxicity completed on 4/10.  9.  Anemia/melena.  Conservative care by GI.  Continue Protonix twice daily   Hemoglobin 8.5 on 4/11  Labs ordered for tomorrow   Cont to monitor 10.  Hypertension.  Cozaar 100 mg daily, Procardia 90 mg daily.  Monitor with increased mobility   ? Trending up   Chlorthalidone 12.5 started on 4/14 11.  Diabetes mellitus with peripheral neuropathy.  Hemoglobin A1c 12.5.     Levemir 40 units nightly, decreased to 30 on 4/14   NovoLog 10 units 3 times daily, increased to 13 on 4/14.     Check blood sugars before meals and at bedtime.  -Continue with same regimen with sliding scale insulin for coverage   Remained labile and 4/14 12.  Constipation.  Laxative  assistance 13.  Legally blind. 14.  CKD stage III.     Creatinine 1.10 on 4/11 15. Hypokalemia   Potassium 3.2 on 4/11  Daily supplement increased again on 4/11  Labs ordered for Monday  Magnesium ordered for tomorrow 16. Hypoalbuminemia  Supplement initiated on 4/4 17. Gas  Simethicone Scheduled on 4/12   improved  LOS (Days) 11 A FACE TO FACE EVALUATION WAS PERFORMED  Elois Averitt Lorie Phenix 04/23/2017 8:01 AM

## 2017-04-24 ENCOUNTER — Inpatient Hospital Stay (HOSPITAL_COMMUNITY): Payer: Medicare Other

## 2017-04-24 ENCOUNTER — Inpatient Hospital Stay (HOSPITAL_COMMUNITY): Payer: Self-pay | Admitting: Occupational Therapy

## 2017-04-24 ENCOUNTER — Encounter (HOSPITAL_COMMUNITY): Payer: Self-pay | Admitting: Psychology

## 2017-04-24 ENCOUNTER — Inpatient Hospital Stay (HOSPITAL_COMMUNITY): Payer: Self-pay

## 2017-04-24 DIAGNOSIS — Z1339 Encounter for screening examination for other mental health and behavioral disorders: Secondary | ICD-10-CM

## 2017-04-24 DIAGNOSIS — H548 Legal blindness, as defined in USA: Secondary | ICD-10-CM

## 2017-04-24 LAB — CBC WITH DIFFERENTIAL/PLATELET
BASOS ABS: 0 10*3/uL (ref 0.0–0.1)
BASOS PCT: 0 %
EOS ABS: 0.2 10*3/uL (ref 0.0–0.7)
Eosinophils Relative: 4 %
HCT: 29.6 % — ABNORMAL LOW (ref 39.0–52.0)
HEMOGLOBIN: 9.3 g/dL — AB (ref 13.0–17.0)
Lymphocytes Relative: 31 %
Lymphs Abs: 1.2 10*3/uL (ref 0.7–4.0)
MCH: 27.5 pg (ref 26.0–34.0)
MCHC: 31.4 g/dL (ref 30.0–36.0)
MCV: 87.6 fL (ref 78.0–100.0)
MONOS PCT: 9 %
Monocytes Absolute: 0.4 10*3/uL (ref 0.1–1.0)
NEUTROS PCT: 56 %
Neutro Abs: 2.3 10*3/uL (ref 1.7–7.7)
Platelets: 314 10*3/uL (ref 150–400)
RBC: 3.38 MIL/uL — ABNORMAL LOW (ref 4.22–5.81)
RDW: 16.5 % — ABNORMAL HIGH (ref 11.5–15.5)
WBC: 4.1 10*3/uL (ref 4.0–10.5)

## 2017-04-24 LAB — GLUCOSE, CAPILLARY
GLUCOSE-CAPILLARY: 156 mg/dL — AB (ref 65–99)
GLUCOSE-CAPILLARY: 126 mg/dL — AB (ref 65–99)
GLUCOSE-CAPILLARY: 179 mg/dL — AB (ref 65–99)
Glucose-Capillary: 78 mg/dL (ref 65–99)

## 2017-04-24 LAB — BASIC METABOLIC PANEL
Anion gap: 10 (ref 5–15)
BUN: 14 mg/dL (ref 6–20)
CALCIUM: 8.8 mg/dL — AB (ref 8.9–10.3)
CO2: 23 mmol/L (ref 22–32)
CREATININE: 1.32 mg/dL — AB (ref 0.61–1.24)
Chloride: 105 mmol/L (ref 101–111)
GFR calc non Af Amer: 57 mL/min — ABNORMAL LOW (ref >60)
Glucose, Bld: 197 mg/dL — ABNORMAL HIGH (ref 65–99)
Potassium: 3.5 mmol/L (ref 3.5–5.1)
SODIUM: 138 mmol/L (ref 135–145)

## 2017-04-24 LAB — MAGNESIUM: MAGNESIUM: 1.7 mg/dL (ref 1.7–2.4)

## 2017-04-24 NOTE — Consult Note (Signed)
Neuropsychological Consultation   Patient:   Trevor [name]   DOB:   04-06-1955  MR Number:  [id number]  Location:  [place] [address] [facility] [address] [id number] [place] [address] Dept: [place]: [phone]           Date of Service:   04/24/2017  Start Time:   8 AM End Time:   9 AM  Provider/Observer:  Ilean Skill, Psy.D.       Clinical Neuropsychologist       Billing Code/Service: [phone] 4 Units  Chief Complaint:    Trevor [name] is a 62 year old male with history of diabetes, hypertension, legally blind.  CKD stage II as well.  Presented 03/29/2017 with osteomyelitis abscess of right foot.  Underwent right transtibial amputation.  Patient's family report that Trevor [name] may not have been taking care of self very well prior.  Patient's cognitive functioning also poor at time of admission.  On 04/08/2017 altered mental status and somnolence.  Mental status has improved as overall medial status has improved.    Reason for Service:  Trevor [name] was referred due to issues of coping and adjustment with concerns about depression.  While mental status has improved this was also assessed during visit.  Below is the HPI for the current admission.  HPI: Trevor [name] a 62 y.o.right-handed malewith history of diabetes mellitus, hypertension,legally blind, CKD stage III.Per chartreview and patient, patientlives with nephew in [place]. Nephew attends school and works.Family in area work.Presented 03/29/2017 with findings of osteomyelitis abscess of the right foot. Initially underwent right foot first and second ray amputation 3/22/2019that continued to show progressive ischemic changes and underwent right transtibial amputation 04/01/2017 per Trevor [name]. Hospital course pain managementand transition to oral Dilaudid as needed.Wound VAC appliedfor 1 week postoperatively.Plan stump shrinker per [employer]  prosthetics.Blood cultures +1 of 2 for strep VIRIDANSbacteremia and advised IV ceftezole and through 04/15/2017.Acute blood loss anemia monitored. Creatinine stable with history of CKD stage III, baseline 2.06Physical therapy evaluation completed with recommendations of physical medicine rehab consult.Patient was admitted for a comprehensive rehab program 04/04/2017.  Initially needing some encouragement to participate.    During his rehabilitation course patient with noted anemia, melena with gastroenterology consulted and advised supportive care with PPI.  Trevor [name] was transfused 2 units of packed red blood cells.  On 04/08/2017 patient with altered mental status and more somnolent.  Discharged to acute care services 04/08/2017 for ongoing workup. MRI of the brain reviewed, unremarkable for acute intracranial process. CT Angio of the chest showed no pulmonary emboli.  EEG negative.  Carotid Dopplers with no ICA stenosis.  Initial plan for lumbar puncture but discontinued.  Echocardiogram ?pending. Per neurology follow-up workup it was felt that altered mental status was related to his cephalosporin toxicity and Ancef was changed to Unasyn.  Latest blood cultures were collected 04/08/2017 no growth to date.  Mental status has continued to improve.  Physical and occupational therapy has resumed and patient was readmitted for a comprehensive rehab program.  Current Status:  Today, Trevor [name].  Trevor [name] acknowledged that Trevor [name] was not as consistent with self care, especially the wound on foot as Trevor [name] should have been.  Trevor [name] reports that Trevor [name] did not think Trevor [name] was clinically depressed then but does have improved mood now.  The patient has concerns about going to skilled nursing vs home and while Trevor [name] is ok with skilled nursing placement Trevor [name] wants  to eventually get back home.  Trevor [name] is coping with current situation to an adequate degree and as cognitive functioning has improved his motivation has  improved.  Behavioral Observation: Trevor [name]  [name] as a 62 y.o.-year-old Right African American Male who appeared his stated age. his dress was Appropriate and Trevor [name] was Well Groomed and his manners were Appropriate to the situation.  his participation was indicative of Appropriate and Attentive behaviors.  There were any physical disabilities noted.  Trevor [name] displayed an appropriate level of cooperation and motivation.     Interactions:    Active Appropriate and Attentive  Attention:   within normal limits and attention span and concentration were age appropriate  Memory:   within normal limits; recent and remote memory intact  Visuo-spatial:  not examined  Speech (Volume):  normal  Speech:   normal; normal  Thought Process:  Coherent and Relevant  Though Content:  WNL; not suicidal and not homicidal  Orientation:   person, place, time/date and situation  Judgment:   Fair  Planning:   Fair  Affect:    Appropriate  Mood:    Dysphoric  Insight:   Fair  Intelligence:   normal  Medical History:   Past Medical History:  Diagnosis Date  . Diabetes mellitus without complication ([place])   . Heart murmur   . Hyperlipidemia   . Hypertension   . Sleep apnea    Psychiatric History:  No prior history of documented depression or other psychiatric issues but question of possible depression vs medical illness prior to admission.    Family Med/Psych History: History reviewed. No pertinent family history.  Risk of Suicide/Violence: virtually non-existent   Impression/DX:  Trevor [name] is a 62 year old male with history of diabetes, hypertension, legally blind.  CKD stage II as well.  Presented 03/29/2017 with osteomyelitis abscess of right foot.  Underwent right transtibial amputation.  Patient's family report that Trevor [name] may not have been taking care of self very well prior.  Patient's cognitive functioning also poor at time of admission.  On 04/08/2017 altered mental status and  somnolence.  Mental status has improved as overall medial status has improved.    Today, Trevor [name] was oriented and [name].  Trevor [name] acknowledged that Trevor [name] was not as consistent with self care, especially the wound on foot as Trevor [name] should have been.  Trevor [name] reports that Trevor [name] did not think Trevor [name] was clinically depressed then but does have improved mood now.  The patient has concerns about going to skilled nursing vs home and while Trevor [name] is ok with skilled nursing placement Trevor [name] wants to eventually get back home.  Trevor [name] is coping with current situation to an adequate degree and as cognitive functioning has improved his motivation has improved.  Diagnosis:    BKA        Electronically Signed   _______________________ Ilean Skill, Psy.D.

## 2017-04-24 NOTE — Progress Notes (Signed)
Speech Language Pathology Discharge Summary  Patient Details  Name: Trevor [name] MRN: [id number] Date of Birth: 10-Sep-1955  Today's Date: 04/24/2017 SLP Individual Time: 0950-1045 SLP Individual Time Calculation (min): 55 min   Skilled Therapeutic Interventions:  Skilled ST services focused on cognitive skills. SLP facilitated delayed recall of complex information, pt demonstrated recall of Friday's ST activity including some the items he named during divergent word finding task at Mod I given extra time. SLP facilitated complex problem solving and working memory utilizing deductive reasoning puzzle, pt demonstrated Mod I. Pt demonstrated return demonstration of word finding and recall strategies for completion of education. Pt agreed he at Mod I and appropriate for discharge. Pt was left in room with call bell within and all questions answered to satisfaction.     Patient has met   of   long term goals.  Patient to discharge at overall   level.  Reasons goals not met:     Clinical Impression/Discharge Summary:    Pt made great progress meeting 3 out 3 goals, discharging at Mod I level in complex problem solving, recall and abstract though word finding skills. Pt demonstrated ability to compensate for visual deficits and high level of safety awareness. Pt has been educated on recall, word finding and complex problem solving strategies. Pt benefited from skilled ST services in order to maximize functional independence and reduce burden of care requiring no further skilled ST services.  Care Partner:        Recommendation:         Equipment:     Reasons for discharge:         Function:  Eating Eating                 Cognition Comprehension Comprehension assist level: Understands complex 90% of the time/cues 10% of the time  Expression   Expression assist level: Expresses complex ideas: With extra time/assistive device  Social Interaction Social Interaction assist  level: Interacts appropriately 90% of the time - Needs monitoring or encouragement for participation or interaction.  Problem Solving Problem solving assist level: Solves complex problems: With extra time  Memory Memory assist level: More than reasonable amount of time   Arneshia Ade  [facility] 04/24/2017, 4:12 PM

## 2017-04-24 NOTE — Progress Notes (Signed)
[place] PHYSICAL MEDICINE & REHABILITATION     PROGRESS NOTE  Subjective/Complaints:  Pt seen lying in bed this AM.  He slept well overnight.  He is looking forward to discharge tomorrow.    ROS: Denies CP, SOB, N/V/D.  Objective: Vital Signs: Blood pressure (!) 148/95, pulse 93, temperature 98.7 F (37.1 C), temperature source Oral, resp. rate 18, height 6\' 4"  (1.93 m), weight 130 kg (286 lb 9.6 oz), SpO2 98 %. No results found. No results for input(s): WBC, HGB, HCT, PLT in the last 72 hours. No results for input(s): NA, K, CL, GLUCOSE, BUN, CREATININE, CALCIUM in the last 72 hours.  Invalid input(s): CO CBG (last 3)  Recent Labs    04/23/17 1633 04/23/17 2051 04/24/17 0614  GLUCAP 137* 186* 156*    Wt Readings from Last 3 Encounters:  04/12/17 130 kg (286 lb 9.6 oz)  04/08/17 (!) 138.3 kg (304 lb 14.3 oz)  04/08/17 (!) 137.2 kg (302 lb 8 oz)    Physical Exam:  BP (!) 148/95 (BP Location: Left Arm)   Pulse 93   Temp 98.7 F (37.1 C) (Oral)   Resp 18   Ht 6\' 4"  (1.93 m)   Wt 130 kg (286 lb 9.6 oz)   SpO2 98%   BMI 34.89 kg/m  Constitutional: No distress . Vital signs reviewed. HEENT: EOMI, oral membranes moist Cardiovascular: RRR. No JVD    Respiratory: CTA Bilaterally. Normal effort    GI: BS +, non-distended   Musculoskeletal: Stump less tender Neurological: He is alert and oriented.  Followed simple commands. Motor: B/l UE, LLE 5/5 proximal to distal LLE: 5/5 proximal to distal (stable) Right HF: 5/5 (stable) Skin: stump incision C/D/I Psychiatric: Alert and appropriate   Assessment/Plan: 1. Functional deficits secondary to right BKA which require 3+ hours per day of interdisciplinary therapy in a comprehensive inpatient rehab setting. Physiatrist is providing close team supervision and 24 hour management of active medical problems listed below. Physiatrist and rehab team continue to assess barriers to discharge/monitor patient progress toward  functional and medical goals.  Function:  Bathing Bathing position   Position: Wheelchair/chair at sink  Bathing parts Body parts bathed by patient: Right arm, Left arm, Chest, Abdomen, Front perineal area, Buttocks, Right upper leg, Left upper leg, Left lower leg Body parts bathed by helper: Back  Bathing assist Assist Level: Supervision or verbal cues      Upper Body Dressing/Undressing Upper body dressing   What is the patient wearing?: Pull over shirt/dress     Pull over shirt/dress - Perfomed by patient: Thread/unthread left sleeve, Thread/unthread right sleeve, Put head through opening, Pull shirt over trunk          Upper body assist Assist Level: Set up   Set up : To obtain clothing/put away  Lower Body Dressing/Undressing Lower body dressing   What is the patient wearing?: Pants, Non-skid slipper socks     Pants- Performed by patient: Thread/unthread right pants leg, Pull pants up/down, Thread/unthread left pants leg   Non-skid slipper socks- Performed by patient: Don/doff left sock                    Lower body assist Assist for lower body dressing: Supervision or verbal cues      Toileting Toileting   Toileting steps completed by patient: Adjust clothing prior to toileting, Performs perineal hygiene, Adjust clothing after toileting Toileting steps completed by helper: Adjust clothing after toileting Toileting Assistive Devices: Grab  bar or rail  Toileting assist Assist level: Supervision or verbal cues   Transfers Chair/bed transfer   Chair/bed transfer method: Squat pivot Chair/bed transfer assist level: Supervision or verbal cues Chair/bed transfer assistive device: Armrests, Medical sales representative     Max distance: 32 Assist level: Supervision or verbal cues   Wheelchair   Type: Manual Max wheelchair distance: 150 ft Assist Level: No help, No cues, assistive device, takes more than reasonable amount of time   Cognition Comprehension Comprehension assist level: Follows complex conversation/direction with extra time/assistive device  Expression Expression assist level: Expresses complex 90% of the time/cues < 10% of the time  Social Interaction Social Interaction assist level: Interacts appropriately with others with medication or extra time (anti-anxiety, antidepressant).  Problem Solving Problem solving assist level: Solves basic problems with no assist  Memory Memory assist level: More than reasonable amount of time    Medical Problem List and Plan: 1.  Decreased functional mobility secondary to right transtibial amputation 0/94/7096 complicated by acute encephalopathy related to Cephalosporin toxicity.     Continue CIR   Limb guard to protect incision/leg  2.  DVT Prophylaxis/Anticoagulation: Subcutaneous Lovenox resumed for DVT prophylaxis 3. Pain Management: Tylenol as needed   Tolerating low-dose tramadol as needed 4. Mood: Provide emotional support 5. Neuropsych: This patient is capable of making decisions on his own behalf. 6. Skin/Wound Care: Routine skin checks 7. Fluids/Electrolytes/Nutrition: Routine I/O's 8.  ID.  Viridans strep bacteremia.  Unasyn after Cephalosporin toxicity completed on 4/10.  9.  Anemia/melena.  Conservative care by GI.  Continue Protonix twice daily   Hemoglobin 8.5 on 4/11  Labs pending   Cont to monitor 10.  Hypertension.  Cozaar 100 mg daily, Procardia 90 mg daily.  Monitor with increased mobility   Chlorthalidone 12.5 started on 4/14   May need further ambulatory adjustments 11.  Diabetes mellitus with peripheral neuropathy.  Hemoglobin A1c 12.5.     Levemir 40 units nightly, decreased to 30 on 4/14   NovoLog 10 units 3 times daily, increased to 13 on 4/14.     Check blood sugars before meals and at bedtime.   Labile, but overall improved on 4/15 12.  Constipation.  Laxative assistance 13.  Legally blind. 14.  CKD stage III.     Creatinine 1.10 on  4/11 15. Hypokalemia   Potassium 3.2 on 4/11  Daily supplement increased again on 4/11  Labs pending  Magnesium pending 16. Hypoalbuminemia  Supplement initiated on 4/4 17. Gas  Simethicone Scheduled on 4/12   Improved  LOS (Days) 12 A FACE TO FACE EVALUATION WAS PERFORMED  Zayla Agar Lorie Phenix 04/24/2017 8:18 AM

## 2017-04-24 NOTE — Progress Notes (Addendum)
Physical Therapy Note  Patient Details  Name: Trevor [name] MRN: [id number] Date of Birth: 20-Jun-1955 Today's Date: 04/24/2017  1045-1200, 75 min individual tx Pain: 5/10 posterior residual limb; premedicated  Seated in w/c, pt donned shoe over non slip sock, with extra time and supervision, due to vision deficits.    Session focused on w/c propulsion, simulated car transfer, gait training on level tile, therapeutic exercises performed with LE to increase strength for functional mobility: seated heel raises, toe raises, L long arc quad knee extension with ankle pumps at end range, R/L hip flexion. X 15 each.  Seated in w/c: use of Kinetron with LLE unilaterally, with resistance 40 cm/sec and resistance from PT x 20 cycles x 4 for strengthening.  Simulated car transfer and basic transfer with close supervision, RW.   W/c propulsion on level tile x 150' modified independent; he needed cues for brakes during transfers. Gait training on level tile x 30' with close supervision; pt needed cues from PT to stop and sit down for safety, when LLE foot clearance decreased.   Bed mobility on firm mat without rails, modified independent. Dynamic seated balance activity, reaching out of BOS in all directions to tap a small object, with stand by assistance and extra time due to vision deficits.  Pt left resting in w/c, set up for lunch with all needs at hand and quick release belt donned.  See function navigator for current status.   Aerie Donica 04/24/2017, 7:56 AM

## 2017-04-24 NOTE — Progress Notes (Signed)
Occupational Therapy Session Note  Patient Details  Name: Trevor [name] MRN: [id number] Date of Birth: [birth year]  Today's Date: 04/24/2017 OT Individual Time: 1405-1500 OT Individual Time Calculation (min): 55 min   Short Term Goals: Week 1:  OT Short Term Goal 1 (Week 1): LTG-STG OT Short Term Goal 1 - Progress (Week 1): Progressing toward goal  Skilled Therapeutic Interventions/Progress Updates:    Pt greeted on toilet post BM. Able to complete hygiene and clothing mgt with steady assist using grab bar, then transferred to w/c via stand pivot. While washing hands, he reported that he stood/ambulated a lot today and wanted to complete his therapy seated. Therefore tx focus on UB strengthening. Had him self propel w/c to and from outdoor setting while weaving around therapist to increase challenge. Outdoors, he completed bilateral UE therapeutic exercise with use of 4# bar x15 reps with cues for technique. Once back in room, pt transferred back to bed via stand pivot with steady assist. Pt was repositioned for comfort and left with all needs and bed alarm set.   Therapy Documentation Precautions:  Precautions Precautions: Fall Required Braces or Orthoses: Other Brace/Splint Restrictions Weight Bearing Restrictions: Yes RLE Weight Bearing: Non weight bearing Vital Signs: Therapy Vitals Temp: 97.7 F (36.5 C) Temp Source: Oral Pulse Rate: 93 BP: 131/86 Patient Position (if appropriate): Lying Oxygen Therapy SpO2: 100 % O2 Device: Room Air Pain: No c/o pain during session    ADL: ADL ADL Comments: see functional navigator    See Function Navigator for Current Functional Status.   Therapy/Group: Individual Therapy  [name] A [name] 04/24/2017, 3:52 PM

## 2017-04-25 ENCOUNTER — Inpatient Hospital Stay (HOSPITAL_COMMUNITY): Payer: Self-pay | Admitting: Physical Therapy

## 2017-04-25 ENCOUNTER — Inpatient Hospital Stay (HOSPITAL_COMMUNITY): Payer: Self-pay

## 2017-04-25 ENCOUNTER — Inpatient Hospital Stay (HOSPITAL_COMMUNITY): Payer: Medicare Other | Admitting: Occupational Therapy

## 2017-04-25 LAB — GLUCOSE, CAPILLARY
GLUCOSE-CAPILLARY: 43 mg/dL — AB (ref 65–99)
GLUCOSE-CAPILLARY: 93 mg/dL (ref 65–99)
GLUCOSE-CAPILLARY: 178 mg/dL — AB (ref 65–99)
Glucose-Capillary: 185 mg/dL — ABNORMAL HIGH (ref 65–99)
Glucose-Capillary: 178 mg/dL — ABNORMAL HIGH (ref 65–99)
Glucose-Capillary: 65 mg/dL (ref 65–99)

## 2017-04-25 MED ORDER — MAGNESIUM OXIDE 400 (241.3 MG) MG PO TABS
200.0000 mg | ORAL_TABLET | Freq: Every day | ORAL | Status: DC
  Administered 2017-04-25 – 2017-04-28 (×4): 200 mg via ORAL
  Filled 2017-04-25 (×4): qty 1

## 2017-04-25 MED ORDER — ATENOLOL 25 MG PO TABS
25.0000 mg | ORAL_TABLET | Freq: Every day | ORAL | Status: DC
  Administered 2017-04-25: 25 mg via ORAL
  Filled 2017-04-25 (×2): qty 1

## 2017-04-25 MED ORDER — INSULIN DETEMIR 100 UNIT/ML ~~LOC~~ SOLN
34.0000 [IU] | Freq: Every day | SUBCUTANEOUS | Status: DC
  Administered 2017-04-25 – 2017-04-27 (×3): 34 [IU] via SUBCUTANEOUS
  Filled 2017-04-25 (×3): qty 0.34

## 2017-04-25 NOTE — Progress Notes (Signed)
Occupational Therapy Session Note  Patient Details  Name: Trevor [name] MRN: [id number] Date of Birth: 03/02/1955  Today's Date: 04/25/2017 OT Individual Time: 1100-1200 and 1400-1426 OT Individual Time Calculation (min): 60 min and 26 min   Short Term Goals: Week 2:  OT Short Term Goal 1 (Week 2): STG = LTGs due to remaining LOS  Skilled Therapeutic Interventions/Progress Updates:    1) Treatment session with focus on functional transfers and d/c planning as pt will not have 24/7 supervision available.  Engaged in transfers in ADL apt w/c <> couch as pt reports he has low couch that he would prefer to sit on.  Supervision- mod I transfer to couch, however min assist to supervision transferring back to w/c.  Pt reports his couch is more firm, plan to attempt furniture transfers in another part of hospital to better simulate home setup.  Pt required increased time pre/post transfers as therapist requiring pt to manage w/c parts and setup.  Pt with increased difficulty with managing leg rests due to vision impairment.  Pt reports need to toilet.  Completed transfer to [facility] over toilet in bathroom with pt setting up w/c and removing leg rests, pt utilized grab bar to steady self while managing clothing prior to toileting.  Mod I transfer, supervision with clothing management.  Pt will benefit from continued blocked practice with transfers and clothing management pre/post toileting.  2) Treatment session with focus on tub/shower transfer.  Pt reports bathroom doors being widened at home to allow w/c to fit into bathroom.  Pt completed squat pivot transfer w/c <> tub bench x2 with initial cue for positioning of w/c followed by supervision with transfer.  Discussed recommendation for supervision with shower transfers and bathing at shower level, with pt reporting understanding.  Returned to room and pt transferred back to bed with pt setting up w/c with increased time and no cues.  Therapy  Documentation Precautions:  Precautions Precautions: Fall Required Braces or Orthoses: Other Brace/Splint Restrictions Weight Bearing Restrictions: Yes RLE Weight Bearing: Non weight bearing Pain:  Pt with no c/o pain  See Function Navigator for Current Functional Status.   Therapy/Group: Individual Therapy  Simonne Come 04/25/2017, 1:00 PM

## 2017-04-25 NOTE — Progress Notes (Signed)
Physical Therapy Session Note  Patient Details  Name: RAYLIN WINER MRN: [id number] Date of Birth: April 26, 1955  Today's Date: 04/25/2017 PT Individual Time: 1300-1400 PT Individual Time Calculation (min): 60 min   Short Term Goals: Week 1:  PT Short Term Goal 1 (Week 1): STG=LTG due to ELOS  Week 2:   = LTG due to LOS - upgraded transfer goals to modified independent basic w/c level  Skilled Therapeutic Interventions/Progress Updates:    Session focused on d/c planning and home environment set up for simulation in preparation for upgrading transfer goals to modified independent as pt will not have 24/7 supervision available. In ADL apartment, simulated bed transfer how pt could access at home (from foot of bed due to not enough room next to bed for w/c) with pt able to set up w/c parts with extra time and complete transfer with supervision (uneven soft surface, so required supervision for safety but pt reports his bed at home is lower so this should not be as much of an issue). Pt able to scoot up in the bed and perform bed mobility modified independent from the foot of the bed and return back for supervision transfer back to w/c managing legrests with extra time. Problem solved through furniture transfer as none of our recliners or couches are close to what pt has at home (located an option in other part of hospital after session over for pt to attempt tomorrow during therapies) and discussed importance of setting things up prior to family leaving to make more accessible for patient. For pt's armchair, recommending to use an easily moveable chair to keep on R side for pt to prop residual limb with pillow to keep elevated and in extension while watching tv (pt reports this is likely where he will spend a lot of his day). Also discussed home set up in kitchen for access to easy meals during day and recommendation to only walk with RW when family is available to provide supervision level assistance for  short distances. Pt verbalized understanding. Pt will continue to benefit from continued blocked practice home environment transfers (uneven and furniture) to prepare for home with transfer goal upgraded to modified independent.   Therapy Documentation Precautions:  Precautions Precautions: Fall Required Braces or Orthoses: Other Brace/Splint Restrictions Weight Bearing Restrictions: Yes RLE Weight Bearing: Non weight bearing  Pain: No complaints.   See Function Navigator for Current Functional Status.   Therapy/Group: Individual Therapy  Canary Brim Ivory Broad, PT, DPT  04/25/2017, 2:27 PM

## 2017-04-25 NOTE — Progress Notes (Signed)
Physical Therapy Weekly Progress Note  Patient Details  Name: Trevor [name] MRN: [id number] Date of Birth: [birth year]  Beginning of progress report period: April 13, 2017 End of progress report period: April 25, 2017  Today's Date: 04/25/2017 PT Individual Time: 0900-1000 PT Individual Time Calculation (min): 60 min   Patient has met 6 out 11 goals.  Pt has made good progression during current course of therapy. He is currently at an overall supervision level with good potential for mod I at w/c level. He demonstrates improving safety awareness with w/c and demonstrates improving w/c management of brakes and leg rests. Pt is mod I for bed mobility on firmer surface and able to ambulate distances up to 18f with close supervision. He continues to be limited by endurance and strength.    Patient continues to demonstrate the following deficits muscle weakness and muscle joint tightness, decreased coordination, decreased memory and delayed processing and decreased standing balance and decreased balance strategies and therefore will continue to benefit from skilled PT intervention to increase functional independence with mobility.  Patient progressing toward long term goals..  Plan of care revisions: upgraded bed <> chair and furniture transfers to modified independent..  PT Short Term Goals Week 1:  PT Short Term Goal 1 (Week 1): STG=LTG due to ELOS  Week 2:  PT Short Term Goal 1 (Week 2): = LTGs; bed <> chair transfer goals upgraded to modified independent  Skilled Therapeutic Interventions/Progress Updates: Pt presented sitting at sink bathing UE and ageeable to therapy after completion of bathing. PTA assisted with washing back and pt was able to perform sit to stand at sink with supervision and perform peri-hygiene with single UE support. Pt was able to don sock with set up and required modA for donning brief but supervision for pulling up pants. After completion of bathing/dressing pt  propelled to rehab gym mod I and participated in w/c mobility obstacle course simulating narrow spaces and congested areas. Pt able to perform with mod I. Pt demonstrated ascend/descend ramp with x 1 occurrence of front casters popping up with demonstration of forceful velocity over ramp. Pt instructed in w/c management and placement of leg rests. PTA placed co-band to provide visual aide and pt able to place/lock and remove with supervision and increased time. Pt ambulated 265fwith RW with supervision and demonstrating fair safe foot clearance with distance noted. Pt returned to room at end of session and remained in w/c with call bell within reach and needs met.      Therapy Documentation Precautions:  Precautions Precautions: Fall Required Braces or Orthoses: Other Brace/Splint Restrictions Weight Bearing Restrictions: Yes RLE Weight Bearing: Non weight bearing  See Function Navigator for Current Functional Status.  Therapy/Group: Individual Therapy  Rosita DeChalus  Rosita DeChalus, PTA    Treatment note written by Rosita DeChalus. Weekly note and goals set by AlCanary BrimAlLars MassonPT, DPT 2:44 PM 04/25/17   04/25/2017, 2:10 PM

## 2017-04-25 NOTE — Progress Notes (Signed)
[place] PHYSICAL MEDICINE & REHABILITATION     PROGRESS NOTE  Subjective/Complaints:  Patient seen lying in bed this AM.  He tells me he is not going home today and instead going to a SNF.  He slept well overnight and requests his home beta blocker be resumed.   ROS: Denies CP, SOB, N/V/D.  Objective: Vital Signs: Blood pressure (!) 144/91, pulse 90, temperature 98.5 F (36.9 C), temperature source Oral, resp. rate 18, height 6\' 4"  (1.93 m), weight 130 kg (286 lb 9.6 oz), SpO2 100 %. No results found. Recent Labs    04/24/17 0931  WBC 4.1  HGB 9.3*  HCT 29.6*  PLT 314   Recent Labs    04/24/17 0931  NA 138  K 3.5  CL 105  GLUCOSE 197*  BUN 14  CREATININE 1.32*  CALCIUM 8.8*   CBG (last 3)  Recent Labs    04/24/17 1631 04/24/17 2057 04/25/17 0649  GLUCAP 78 179* 185*    Wt Readings from Last 3 Encounters:  04/12/17 130 kg (286 lb 9.6 oz)  04/08/17 (!) 138.3 kg (304 lb 14.3 oz)  04/08/17 (!) 137.2 kg (302 lb 8 oz)    Physical Exam:  BP (!) 144/91 (BP Location: Left Arm)   Pulse 90   Temp 98.5 F (36.9 C) (Oral)   Resp 18   Ht 6\' 4"  (1.93 m)   Wt 130 kg (286 lb 9.6 oz)   SpO2 100%   BMI 34.89 kg/m  Constitutional: No distress . Vital signs reviewed. HEENT: EOMI, oral membranes moist Cardiovascular: RRR. No JVD    Respiratory: CTA Bilaterally. Normal effort    GI: BS +, non-distended   Musculoskeletal: Stump less tender Neurological: He is alert and oriented.  Followed simple commands. Motor: B/l UE, LLE 5/5 proximal to distal LLE: 5/5 proximal to distal (unchanged) Right HF: 5/5 (unchanged) Skin: stump with dressing C/D/I Psychiatric: Alert and appropriate   Assessment/Plan: 1. Functional deficits secondary to right BKA which require 3+ hours per day of interdisciplinary therapy in a comprehensive inpatient rehab setting. Physiatrist is providing close team supervision and 24 hour management of active medical problems listed  below. Physiatrist and rehab team continue to assess barriers to discharge/monitor patient progress toward functional and medical goals.  Function:  Bathing Bathing position   Position: Wheelchair/chair at sink  Bathing parts Body parts bathed by patient: Right arm, Left arm, Chest, Abdomen, Front perineal area, Buttocks, Right upper leg, Left upper leg, Left lower leg Body parts bathed by helper: Back  Bathing assist Assist Level: Supervision or verbal cues      Upper Body Dressing/Undressing Upper body dressing   What is the patient wearing?: Pull over shirt/dress     Pull over shirt/dress - Perfomed by patient: Thread/unthread left sleeve, Thread/unthread right sleeve, Put head through opening, Pull shirt over trunk          Upper body assist Assist Level: Set up   Set up : To obtain clothing/put away  Lower Body Dressing/Undressing Lower body dressing   What is the patient wearing?: Pants, Non-skid slipper socks     Pants- Performed by patient: Thread/unthread right pants leg, Pull pants up/down, Thread/unthread left pants leg   Non-skid slipper socks- Performed by patient: Don/doff left sock                    Lower body assist Assist for lower body dressing: Supervision or verbal cues  Toileting Toileting   Toileting steps completed by patient: Adjust clothing prior to toileting, Performs perineal hygiene, Adjust clothing after toileting Toileting steps completed by helper: Adjust clothing after toileting Toileting Assistive Devices: Grab bar or rail  Toileting assist Assist level: Supervision or verbal cues   Transfers Chair/bed transfer   Chair/bed transfer method: Squat pivot, Stand pivot Chair/bed transfer assist level: Supervision or verbal cues Chair/bed transfer assistive device: Armrests, Medical sales representative     Max distance: 30 Assist level: Supervision or verbal cues   Wheelchair   Type: Manual Max wheelchair  distance: 150 ft Assist Level: No help, No cues, assistive device, takes more than reasonable amount of time  Cognition Comprehension Comprehension assist level: Understands complex 90% of the time/cues 10% of the time  Expression Expression assist level: Expresses complex ideas: With extra time/assistive device  Social Interaction Social Interaction assist level: Interacts appropriately 90% of the time - Needs monitoring or encouragement for participation or interaction.  Problem Solving Problem solving assist level: Solves complex problems: With extra time  Memory Memory assist level: More than reasonable amount of time    Medical Problem List and Plan: 1.  Decreased functional mobility secondary to right transtibial amputation 2/53/6644 complicated by acute encephalopathy related to Cephalosporin toxicity.     Continue CIR, plan for d/c to SNF now  Limb guard to protect incision/leg  2.  DVT Prophylaxis/Anticoagulation: Subcutaneous Lovenox resumed for DVT prophylaxis 3. Pain Management: Tylenol as needed   Tolerating low-dose tramadol as needed 4. Mood: Provide emotional support 5. Neuropsych: This patient is capable of making decisions on his own behalf. 6. Skin/Wound Care: Routine skin checks 7. Fluids/Electrolytes/Nutrition: Routine I/O's 8.  ID.  Viridans strep bacteremia.  Unasyn after Cephalosporin toxicity completed on 4/10.  9.  Anemia/melena.  Conservative care by GI.  Continue Protonix twice daily   Hemoglobin 9.3 on 4/15   Cont to monitor 10.  Hypertension.  Cozaar 100 mg daily, Procardia 90 mg daily.  Monitor with increased mobility   Chlorthalidone 12.5 started on 4/14   Atenolol 25 daily started on 4/16 11.  Diabetes mellitus with peripheral neuropathy.  Hemoglobin A1c 12.5.     Levemir 40 units nightly, decreased to 30 on 4/14, increased to 34 on 4/16   NovoLog 10 units 3 times daily, increased to 13 on 4/14.     Check blood sugars before meals and at bedtime. 12.   Constipation.  Laxative assistance 13.  Legally blind. 14.  CKD stage III.     Creatinine 1.32 on 4/15   Encourage fluids 15. Hypokalemia   Potassium 3.5 on 4/15  Daily supplement increased again on 4/11  Magnesium 1.7 on 4/15, supplement initiated 16. Hypoalbuminemia  Supplement initiated on 4/4 17. Gas  Simethicone Scheduled on 4/12   Improved  LOS (Days) 13 A FACE TO FACE EVALUATION WAS PERFORMED  Haddon Fyfe Lorie Phenix 04/25/2017 7:37 AM

## 2017-04-25 NOTE — Progress Notes (Signed)
Social Work Patient ID: Trevor [name], male   DOB: 10/09/1955, 62 y.o.   MRN: [id number]   After discussion with therapies yesterday, I met with pt and discussed plan at this time.  Explained that he is currently at a supervision LOC which does not qualify him for SNF placement even if he had insurance coverage and that he is really a RHP level placement.  I have asked that therapies (still to discuss with nursing) examine closely if he could possibly reach the originally targeted d/c goals of mod ind.  Pt already has a ramped home entry and family has widened bathroom door (but will also order bedside commode).  Pt understands this and is agreeable with plan to see if mod ind level is realistic for him.  Will monitor closely along with tx team and pt.  Dajai Wahlert, LCSW

## 2017-04-25 NOTE — Progress Notes (Signed)
Hypoglycemic Event  CBG: 43  Treatment: 15 GM carbohydrate snack  Pt had dinner and ensure.   Symptoms: None  Follow-up CBG: Time:1747 CBG Result:93  Possible Reasons for Event: Medication regimen: meal coverage insulin + low carb meal.  Comments/MD notified: Danella Sensing, NP notified. No new orders. Pt stable will recheck before bedtime.     Trevor [name] S

## 2017-04-26 ENCOUNTER — Inpatient Hospital Stay (HOSPITAL_COMMUNITY): Payer: Self-pay | Admitting: Physical Therapy

## 2017-04-26 ENCOUNTER — Inpatient Hospital Stay (HOSPITAL_COMMUNITY): Payer: Medicare Other | Admitting: Occupational Therapy

## 2017-04-26 LAB — CREATININE, SERUM
CREATININE: 1.44 mg/dL — AB (ref 0.61–1.24)
GFR, EST AFRICAN AMERICAN: 60 mL/min — AB (ref >60)
GFR, EST NON AFRICAN AMERICAN: 51 mL/min — AB (ref >60)

## 2017-04-26 LAB — GLUCOSE, CAPILLARY
GLUCOSE-CAPILLARY: 176 mg/dL — AB (ref 65–99)
GLUCOSE-CAPILLARY: 139 mg/dL — AB (ref 65–99)
GLUCOSE-CAPILLARY: 170 mg/dL — AB (ref 65–99)
Glucose-Capillary: 78 mg/dL (ref 65–99)

## 2017-04-26 MED ORDER — ATENOLOL 50 MG PO TABS
50.0000 mg | ORAL_TABLET | Freq: Two times a day (BID) | ORAL | Status: DC
  Administered 2017-04-26 – 2017-04-28 (×5): 50 mg via ORAL
  Filled 2017-04-26 (×5): qty 1

## 2017-04-26 MED ORDER — INSULIN ASPART 100 UNIT/ML ~~LOC~~ SOLN
10.0000 [IU] | Freq: Three times a day (TID) | SUBCUTANEOUS | Status: DC
  Administered 2017-04-26 – 2017-04-28 (×6): 10 [IU] via SUBCUTANEOUS

## 2017-04-26 NOTE — Progress Notes (Signed)
Occupational Therapy Session Note  Patient Details  Name: Trevor [name] MRN: [id number] Date of Birth: 1955/09/09  Today's Date: 04/26/2017 OT Individual Time: 3748-2707 and 1348-1430 OT Individual Time Calculation (min): 60 min and 42 min   Short Term Goals: Week 2:  OT Short Term Goal 1 (Week 2): STG = LTGs due to remaining LOS  Skilled Therapeutic Interventions/Progress Updates:    1) Treatment session with focus on ADL retraining in ADL apt to simulate home environment.  Pt received supine in bed reporting ready for shower.  Pt completed transfer bed > w/c with setup.  Gathered all items at w/c level and pt propelled w/c to ADL apt without assist.  Pt demonstrated improved setup of w/c prior to transfer to tub bench, completed transfer with supervision.  Bathing completed with lateral leans to wash buttocks.  Discussed recommendation for supervision with tub/shower transfers and when bathing at shower level.  Returned to w/c supervision and donned hospital gown (no clean shirts) and hospital socks with setup.  Returned to room where pt donned pants at sit > stand level at sink.  Discussed home setup for dressing, with pt reporting he will most likely get dressed at the sink when bathing at sink or even after shower to allow UE support when standing to pull pants over hips.  2) Treatment session with focus on functional transfers and standing balance.  Pt received in bed upon arrival, completed squat pivot transfer bed > w/c after setup of w/c.  Pt propelled w/c to elevators and accessed elevators with supervision.  Engaged in furniture transfers on 1st floor atrium with focus on setup of w/c, parts management, and safety with transfers.  Pt demonstrating improved safety awareness with improved setup of w/c prior to transfer.  Discussed setup of bathroom with pt reporting concerns of standing at sink in home bathroom.  Engaged in sit > stand in ADL kitchen to simulate countertop in home  bathroom.  Pt able to complete sit <> stand with Mod I and adjusted pants while in standing to address dynamic standing with alternating UE support.  Discussed recommendation to have grab bar installed next to commode in bathroom to increase safety with transfers and dynamic standing balance.  Therapy Documentation Precautions:  Precautions Precautions: Fall Required Braces or Orthoses: Other Brace/Splint Restrictions Weight Bearing Restrictions: Yes RLE Weight Bearing: Non weight bearing Pain: Pain Assessment Pain Score: 2   See Function Navigator for Current Functional Status.   Therapy/Group: Individual Therapy  Simonne Come 04/26/2017, 12:54 PM

## 2017-04-26 NOTE — Progress Notes (Signed)
CRITICAL VALUE ALERT  Critical Value:  Potassium  Date & Time Notied:  04/26/17. 12:15  Provider Notified: Reesa Chew  Orders Received/Actions taken: Yes

## 2017-04-26 NOTE — Progress Notes (Signed)
[place] PHYSICAL MEDICINE & REHABILITATION     PROGRESS NOTE  Subjective/Complaints:  Patient seen lying in bed this morning. He states he slept well overnight.  ROS: denies CP, SOB, N/V/D.  Objective: Vital Signs: Blood pressure (!) 147/89, pulse 82, temperature 98.6 F (37 C), temperature source Oral, resp. rate 18, height 6\' 4"  (1.93 m), weight 130 kg (286 lb 9.6 oz), SpO2 98 %. No results found. Recent Labs    04/24/17 0931  WBC 4.1  HGB 9.3*  HCT 29.6*  PLT 314   Recent Labs    04/24/17 0931 04/26/17 0554  NA 138  --   K 3.5  --   CL 105  --   GLUCOSE 197*  --   BUN 14  --   CREATININE 1.32* 1.44*  CALCIUM 8.8*  --    CBG (last 3)  Recent Labs    04/25/17 1747 04/25/17 2114 04/26/17 0626  GLUCAP 93 178* 176*    Wt Readings from Last 3 Encounters:  04/12/17 130 kg (286 lb 9.6 oz)  04/08/17 (!) 138.3 kg (304 lb 14.3 oz)  04/08/17 (!) 137.2 kg (302 lb 8 oz)    Physical Exam:  BP (!) 147/89 (BP Location: Left Arm)   Pulse 82   Temp 98.6 F (37 C) (Oral)   Resp 18   Ht 6\' 4"  (1.93 m)   Wt 130 kg (286 lb 9.6 oz)   SpO2 98%   BMI 34.89 kg/m  Constitutional: No distress . Vital signs reviewed. HEENT: EOMI, oral membranes moist Cardiovascular: RRR. No JVD    Respiratory: CTA Bilaterally. Normal effort    GI: BS +, non-distended   Musculoskeletal: Stump less tender Neurological: He is alert and oriented.  Followed simple commands. Motor: B/l UE, LLE 5/5 proximal to distal LLE: 5/5 proximal to distal (stable) Right HF: 5/5 (stable) Skin: stump with dressing C/D/I Psychiatric: Alert and appropriate   Assessment/Plan: 1. Functional deficits secondary to right BKA which require 3+ hours per day of interdisciplinary therapy in a comprehensive inpatient rehab setting. Physiatrist is providing close team supervision and 24 hour management of active medical problems listed below. Physiatrist and rehab team continue to assess barriers to  discharge/monitor patient progress toward functional and medical goals.  Function:  Bathing Bathing position   Position: Wheelchair/chair at sink  Bathing parts Body parts bathed by patient: Right arm, Left arm, Chest, Abdomen, Front perineal area, Buttocks Body parts bathed by helper: Back  Bathing assist Assist Level: Supervision or verbal cues, Set up   Set up : To obtain items  Upper Body Dressing/Undressing Upper body dressing   What is the patient wearing?: Pull over shirt/dress     Pull over shirt/dress - Perfomed by patient: Thread/unthread left sleeve, Thread/unthread right sleeve, Put head through opening, Pull shirt over trunk          Upper body assist Assist Level: Set up   Set up : To obtain clothing/put away  Lower Body Dressing/Undressing Lower body dressing   What is the patient wearing?: Pants, Non-skid slipper socks     Pants- Performed by patient: Thread/unthread right pants leg, Pull pants up/down, Thread/unthread left pants leg   Non-skid slipper socks- Performed by patient: Don/doff left sock                    Lower body assist Assist for lower body dressing: Supervision or verbal cues      Toileting Toileting   Toileting steps  completed by patient: Adjust clothing prior to toileting, Performs perineal hygiene, Adjust clothing after toileting Toileting steps completed by helper: Adjust clothing after toileting Toileting Assistive Devices: Grab bar or rail  Toileting assist Assist level: Supervision or verbal cues   Transfers Chair/bed transfer   Chair/bed transfer method: Squat pivot Chair/bed transfer assist level: Supervision or verbal cues Chair/bed transfer assistive device: Armrests     Locomotion Ambulation     Max distance: 30 Assist level: Supervision or verbal cues   Wheelchair   Type: Manual Max wheelchair distance: 200' Assist Level: No help, No cues, assistive device, takes more than reasonable amount of time   Cognition Comprehension Comprehension assist level: Understands complex 90% of the time/cues 10% of the time  Expression Expression assist level: Expresses complex ideas: With extra time/assistive device  Social Interaction Social Interaction assist level: Interacts appropriately 90% of the time - Needs monitoring or encouragement for participation or interaction.  Problem Solving Problem solving assist level: Solves complex problems: With extra time  Memory Memory assist level: More than reasonable amount of time    Medical Problem List and Plan: 1.  Decreased functional mobility secondary to right transtibial amputation 5/85/2778 complicated by acute encephalopathy related to Cephalosporin toxicity.     Continue CIR, per nursing, patient to high-level for SNF, will need to be mod I prior to discharge  Limb guard to protect incision/leg  2.  DVT Prophylaxis/Anticoagulation: Subcutaneous Lovenox resumed for DVT prophylaxis 3. Pain Management: Tylenol as needed   Tolerating low-dose tramadol as needed 4. Mood: Provide emotional support 5. Neuropsych: This patient is capable of making decisions on his own behalf. 6. Skin/Wound Care: Routine skin checks 7. Fluids/Electrolytes/Nutrition: Routine I/O's 8.  ID.  Viridans strep bacteremia.  Unasyn after Cephalosporin toxicity completed on 4/10.  9.  Anemia/melena.  Conservative care by GI.  Continue Protonix twice daily   Hemoglobin 9.3 on 4/15   Cont to monitor 10.  Hypertension.  Cozaar 100 mg daily, Procardia 90 mg daily.  Monitor with increased mobility   Chlorthalidone 12.5 started on 4/14   Atenolol 25 daily started on 4/16, increased to 50 on 4/17 11.  Diabetes mellitus with peripheral neuropathy.  Hemoglobin A1c 12.5.     Levemir 40 units nightly, decreased to 30 on 4/14, increased to 34 on 4/16   NovoLog 10 units 3 times daily, increased to 13 on 4/14, decreased to 10 on 4/17.     Check blood sugars before meals and at bedtime.    Hypoglycemia overnight 12.  Constipation.  Laxative assistance 13.  Legally blind. 14.  CKD stage III.     Creatinine 1.44 on 4/17   Encourage fluids 15. Hypokalemia   Potassium 3.5 on 4/15  Daily supplement increased again on 4/11  Magnesium 1.7 on 4/15, supplement initiated 16. Hypoalbuminemia  Supplement initiated on 4/4 17. Gas  Simethicone Scheduled on 4/12   Improved  LOS (Days) 14 A FACE TO FACE EVALUATION WAS PERFORMED  Trevor [name] Lorie Phenix 04/26/2017 7:50 AM

## 2017-04-26 NOTE — Progress Notes (Signed)
Physical Therapy Session Note  Patient Details  Name: Trevor [name] MRN: [id number] Date of Birth: 1955-04-20  Today's Date: 04/26/2017 PT Individual Time: 0915-1000  AND 8421-0312 PT Individual Time Calculation (min): 45 min AND 30 min   Short Term Goals: Week 1:  PT Short Term Goal 1 (Week 1): STG=LTG due to ELOS  Week 2:  PT Short Term Goal 1 (Week 2): = LTGs; bed <> chair transfer goals upgraded to modified independent  Skilled Therapeutic Interventions/Progress Updates:  Session 1.  Pt received sitting in WC and agreeable to PT  WC mobility x 145f and 2034fwithout cues or assist from PT on this day. Pt able to appropriately navigate all obstacles with mild increase in time.   Sit<>stand tranfers without cues or assist from PT x  Throughout treatment, slight increase in time to allow proper set up. . Squat pivot transfer to mat table without cues or assist from PT.  [facility] to engage in 2 bouts of connect four; alternating use of R UE and LUE to improve dynamic standing balance. .   Gait training with RW x [id number] distant supervision assist assist from PT for safety.   UBE endurance training x 4 min forward + 4 min reverse. Pt rates Borg RPE 14/20 at end of endurance training.   Patient returned to room and [facility] reach and all needs met.     Session 2.   Pt received sitting in WC and agreeable to PT  PT instructed [facility] in [facility] without cues or assist through various environments of hospital including food court, hall of [facility] and pt room [id number] practice transfer training to arm chair x 4 with supervision assist progressing to no cues or assist. Min cues initially for set up to improve safety and allow increased point oc contact to reduce risk of fall.   Pt returned to room and performed squat pivot  transfer to bed with increased time for set up, but no cue sor assist.  Sit>supine completed with out assist, and left supine in bed with call bell in reach and all needs met.         Therapy Documentation Precautions:  Precautions Precautions: Fall Required Braces or Orthoses: Other Brace/Splint Restrictions Weight Bearing Restrictions: Yes RLE Weight Bearing: Non weight bearing Pain: Pain Assessment Pain Score: 2    See Function Navigator for Current Functional Status.   Therapy/Group: Individual Therapy  AustinLorie Phenix2019, 1:55 PM

## 2017-04-27 ENCOUNTER — Inpatient Hospital Stay (HOSPITAL_COMMUNITY): Payer: Self-pay | Admitting: Physical Therapy

## 2017-04-27 ENCOUNTER — Inpatient Hospital Stay (HOSPITAL_COMMUNITY): Payer: Medicare Other | Admitting: Occupational Therapy

## 2017-04-27 DIAGNOSIS — N183 Chronic kidney disease, stage 3 (moderate): Secondary | ICD-10-CM

## 2017-04-27 LAB — GLUCOSE, CAPILLARY
GLUCOSE-CAPILLARY: 174 mg/dL — AB (ref 65–99)
GLUCOSE-CAPILLARY: 181 mg/dL — AB (ref 65–99)
Glucose-Capillary: 175 mg/dL — ABNORMAL HIGH (ref 65–99)
Glucose-Capillary: 101 mg/dL — ABNORMAL HIGH (ref 65–99)
Glucose-Capillary: 211 mg/dL — ABNORMAL HIGH (ref 65–99)

## 2017-04-27 MED ORDER — MAGNESIUM OXIDE 400 (241.3 MG) MG PO TABS: 200.0000 mg | ORAL_TABLET | Freq: Every day | ORAL | 0 refills | Status: DC

## 2017-04-27 MED ORDER — INSULIN ASPART 100 UNIT/ML FLEXPEN: 10.0000 [IU] | PEN_INJECTOR | Freq: Three times a day (TID) | SUBCUTANEOUS | 11 refills | Status: DC

## 2017-04-27 MED ORDER — LOSARTAN POTASSIUM 25 MG PO TABS: 100.0000 mg | ORAL_TABLET | Freq: Every day | ORAL | 0 refills | Status: DC

## 2017-04-27 MED ORDER — ATENOLOL 50 MG PO TABS: 50.0000 mg | ORAL_TABLET | Freq: Two times a day (BID) | ORAL | 0 refills | Status: DC

## 2017-04-27 MED ORDER — NIFEDIPINE ER OSMOTIC RELEASE 90 MG PO TB24: 90.0000 mg | ORAL_TABLET | Freq: Every day | ORAL | 0 refills | Status: DC

## 2017-04-27 MED ORDER — CHLORTHALIDONE 25 MG PO TABS: 12.5000 mg | ORAL_TABLET | Freq: Every day | ORAL | 0 refills | Status: DC

## 2017-04-27 MED ORDER — TRAMADOL HCL 50 MG PO TABS: 25.0000 mg | ORAL_TABLET | Freq: Three times a day (TID) | ORAL | 0 refills | Status: DC | PRN

## 2017-04-27 MED ORDER — PANTOPRAZOLE SODIUM 40 MG PO TBEC: 40.0000 mg | DELAYED_RELEASE_TABLET | Freq: Two times a day (BID) | ORAL | 0 refills | Status: DC

## 2017-04-27 MED ORDER — INSULIN DETEMIR 100 UNIT/ML FLEXPEN: 34.0000 [IU] | Freq: Every day | SUBCUTANEOUS | 1 refills | Status: DC

## 2017-04-27 NOTE — Plan of Care (Signed)
  Problem: RH BOWEL ELIMINATION Goal: RH STG MANAGE BOWEL WITH ASSISTANCE Description STG Manage Bowel with Assistance. Mod I assist   Outcome: Progressing   Problem: RH BLADDER ELIMINATION Goal: RH STG MANAGE BLADDER WITH ASSISTANCE Description STG Manage Bladder With Assistance Mod I assist   Outcome: Progressing   Problem: RH SKIN INTEGRITY Goal: RH STG SKIN FREE OF INFECTION/BREAKDOWN Description Skin to remain free from infection and breakdown With mod I  assist while on rehab.   Outcome: Progressing   Problem: RH SAFETY Goal: RH STG DECREASED RISK OF FALL WITH ASSISTANCE Description STG Decreased Risk of Fall With Assistance with mod I assist   Outcome: Progressing   Problem: RH PAIN MANAGEMENT Goal: RH STG PAIN MANAGED AT OR BELOW PT'S PAIN GOAL Description Pain to be ,= 3/10  Outcome: Progressing

## 2017-04-27 NOTE — Progress Notes (Signed)
Physical Therapy Discharge Summary  Patient Details  Name: Trevor [name] MRN: [id number] Date of Birth: [birth year]  Today's Date: 04/27/2017 PT Individual Time: 1000-1100 AND 1415-1445 PT Individual Time Calculation (min): 60 min and 30 min    Patient has met 8 of 8 long term goals due to improved activity tolerance, improved balance, improved postural control, increased strength, increased range of motion, decreased pain and ability to compensate for deficits.  Patient to discharge at a wheelchair level Modified Independent.   Patient's care partner unavailable to provide the necessary physical assistance at discharge.  Reasons goals not met: All PT goals met   Recommendation:  Patient will benefit from ongoing skilled PT services in home health setting to continue to advance safe functional mobility, address ongoing impairments in balance, strength, transfers, gait, and minimize fall risk.  Equipment: WC and RW  Reasons for discharge: treatment goals met and discharge from hospital  Patient/family agrees with progress made and goals achieved: Yes   PT treatment: Pt received sitting in WC and agreeable to PT. PT instructed pt in Grad day assessment to measure progress toward goals. See below for details. Patient returned to room and left sitting On toilet with call bell in reach and all needs met.    Session 2.   Pt received sitting in WC and agreeable to PT. PT treatment focused on WC mobility in community environment. WC propulsion instructed by PT over unlevel sidewalk x 126f as well as up/down handicap ramp to parking lot x 1041f Pt able to complete without cues or assist from PT, but noted to require increased time with uphill grade. Patient returned to [place] and [place] and all needs met.       PT Discharge Precautions/Restrictions Restrictions Weight Bearing Restrictions: Yes RLE Weight Bearing: Non weight bearing Vital  Signs  Pain Pain Assessment Pain Scale: 0-10 Pain Score: 0-No pain Vision/Perception  Vision - Assessment Additional Comments: Pt legally blind  Perception Perception: Within Functional Limits Praxis Praxis: Intact  Cognition Overall Cognitive Status: Within Functional Limits for tasks assessed Orientation Level: Oriented X4 Selective Attention: Appears intact Memory: Appears intact Awareness: Appears intact Problem Solving: Appears intact Organizing: Appears intact Self Monitoring: Appears intact Self Correcting: Appears intact Safety/Judgment: Appears intact Sensation Sensation Light Touch: Impaired by gross assessment(reports mild parathesia in distal BLE ) Proprioception: Appears Intact Additional Comments: reports occasional phantom sensations, as well as decreased appreciation to light tough.  Coordination Gross Motor Movements are Fluid and Coordinated: Yes Fine Motor Movements are Fluid and Coordinated: Yes Finger Nose Finger Test: Visual deficts limit smoothness Motor  Motor Motor: Other (comment) Motor - Discharge Observations: Generalized weakness, improved from Eval   Mobility Bed Mobility Bed Mobility: Rolling Right;Rolling Left;Supine to Sit;Sit to Supine Rolling Right: 6: Modified independent (Device/Increase time) Rolling Left: 6: Modified independent (Device/Increase time) Supine to Sit: 6: Modified independent (Device/Increase time) Sit to Supine: 6: Modified independent (Device/Increase time) Transfers Transfers: Yes Sit to Stand: 6: Modified independent (Device/Increase time)(with RW) Stand to Sit: 6: Modified independent (Device/Increase time) Squat Pivot Transfers: 6: Modified independent (Device/Increase time)  Car transfers: 6 : mod I with RW Locomotion  Ambulation Ambulation: Yes Ambulation/Gait Assistance: 5: Supervision Ambulation Distance (Feet): 50 Feet Assistive device: Rolling walker Gait Gait: Yes Gait Pattern: Impaired Gait  Pattern: Step-to pattern(Swing to ) Stairs / Additional Locomotion Stairs: Yes Stairs Assistance: 4: Min assist Stairs Assistance Details: Verbal cues for technique;Visual cues for safe use  of DME/AE;Verbal cues for precautions/safety;Verbal cues for gait pattern;Tactile cues for weight shifting Stair Management Technique: Two rails Number of Stairs: 2 Height of Stairs: 4 Wheelchair Mobility Wheelchair Mobility: Yes Wheelchair Assistance: 6: Modified independent (Device/Increase time) Environmental health practitioner: Both upper extremities Wheelchair Parts Management: Independent Distance: 236f   Trunk/Postural Assessment  Cervical Assessment Cervical Assessment: Within Functional Limits Thoracic Assessment Thoracic Assessment: Within Functional Limits Lumbar Assessment Lumbar Assessment: Exceptions to WFL(posterior pelvic tilt) Postural Control Postural Control: Within Functional Limits  Balance Balance Balance Assessed: Yes Static Sitting Balance Static Sitting - Level of Assistance: 7: Independent Dynamic Sitting Balance Dynamic Sitting - Level of Assistance: 6: Modified independent (Device/Increase time) Static Standing Balance Static Standing - Level of Assistance: 6: Modified independent (Device/Increase time) Dynamic Standing Balance Dynamic Standing - Level of Assistance: 6: Modified independent (Device/Increase time)(with RW ) Extremity Assessment      RLE Assessment RLE Assessment: Exceptions to [facility] Strength RLE Overall Strength Comments: grossly in sitting: 4+/5 hip flex, abduction, adduction; at least 4-/5 knee extension and knee flexion LLE Assessment LLE Assessment: Within Functional Limits   See Function Navigator for Current Functional Status.  ALorie Phenix4/18/2019, 11:04 AM

## 2017-04-27 NOTE — Progress Notes (Signed)
Occupational Therapy Session Note  Patient Details  Name: Trevor [name] MRN: [id number] Date of Birth: [birth year]  Today's Date: 04/27/2017 OT Individual Time: 1638-4665 and 1330-1415 OT Individual Time Calculation (min): 60 min and 45 min   Short Term Goals: Week 2:  OT Short Term Goal 1 (Week 2): STG = LTGs due to remaining LOS  Skilled Therapeutic Interventions/Progress Updates:    1) Completed ADL retraining (bathing and dressing) at overall Mod I level.  Pt received upright in at sink beginning bathing with pt having obtained all necessary items from w/c level prior to beginning bathing.  Pt completed all bathing at Mod I level, even standing to wash buttocks and washing back.  Completed dressing at Mod I level, even donning underwear this session.  Discussed bathroom setup at home and plan to place drop arm BSC next to bed.  Encouraged use of urinal for urinating and only using toilet/BSC for BM with pt reporting understanding.  Completed w/c > bed > drop arm BSC in ADL apt set up to simulate home.  Pt completed all transfers at Mod I level this session.  Discussed lateral leans for clothing management with toileting when on BSC with no grab bar or UE support to complete in standing, with pt reporting understanding.  Returned BSC > bed > w/c with Mod I.  Pt returned to room and left setup with breakfast tray.  Pt requesting to d/c tomorrow as Sat is the Sabbath.  Pt should be ready to d/c tomorrow from OT standpoint.  2) Treatment session with focus on bathroom transfers and w/c mobility in home environment.  Pt propelled w/c to ADL apt without assistance.  Pt setup w/c for transfer on/off tub bench in ADL apt bathroom with increased time and no cues.  Completed transfer onto tub bench with supervision, again reiterated recommendation for supervision with tub/shower transfers and bathing at shower level.  Completed toilet transfer in ADL apt bathroom with discussion to problem solve setup  to simulate home environment.  Pt able to complete squat pivot transfer w/c > 3 in 1 over commode to allow pt to have hand hold.  Discussed placement of grab bar in bathroom to increase safety with transfers.  Also discussed recommendation for use of drop arm BSC in bedroom as primary toilet due to tight setup of bathroom, with pt reporting understanding.  Pt completed w/c mobility in ADL kitchen, accessing items from refrigerator and cabinets with increased time for setup of w/c.  Discussed rearranging items to increase success and safety with obtaining most used items.  Pt passed off to PT.  Therapy Documentation Precautions:  Precautions Precautions: Fall Required Braces or Orthoses: Other Brace/Splint Restrictions Weight Bearing Restrictions: Yes RLE Weight Bearing: Non weight bearing Pain: Pain Assessment Pain Scale: 0-10 Pain Score: 0-No pain  See Function Navigator for Current Functional Status.   Therapy/Group: Individual Therapy  Simonne Come 04/27/2017, 9:52 AM

## 2017-04-27 NOTE — Discharge Summary (Signed)
Discharge summary job # [id number]

## 2017-04-27 NOTE — Discharge Summary (Addendum)
Trevor [name], Trevor [name]               ACCOUNT NO.:  [id number]  MEDICAL RECORD NO.:  [id number]  LOCATION:                                 FACILITY:  PHYSICIAN:  Trevor Lesch, MD        DATE OF BIRTH:  Jun 19, 1955  DATE OF ADMISSION:  04/12/2017 DATE OF DISCHARGE:  04/28/2017                              DISCHARGE SUMMARY   DISCHARGE DIAGNOSES: 1. 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 2. Subcutaneous Lovenox for deep venous thrombosis prophylaxis pain     management.  ID with Viridans strep bacteremia. 3. Anemia. 4. Hypertension. 5. Diabetes mellitus. 6. Peripheral neuropathy. 7. Constipation. 8. Legally blind. 9. Chronic kidney disease, stage 3. 10.Hypokalemia.  HISTORY OF PRESENT ILLNESS:  This is a 62 year old right-handed male with history of diabetes mellitus, hypertension, legally blind, and CKD stage 3.  Lives with nephew in [place], [place], has good assistance from his niece.  Presented on March 29, 2017, with findings of osteomyelitis, abscess of the right foot.  Initially underwent right 1st and 2nd ray amputation on March 31, 2017, continued to show progressive ischemic changes.  Underwent right transtibial amputation on April 01, 2017, per Trevor [name].  HOSPITAL COURSE:  Pain management.  Wound VAC applied.  Blood cultures 1 of 2 for strep viridans bacteremia, advised intravenous antibiotics through April 15, 2017.  Creatinine closely monitored.  Physical and occupational therapy ongoing.  The patient was admitted for an inpatient rehab service program April 04, 2017.  During his hospital rehab course, noted anemia, melena with Gastroenterology consulted, advised supportive care with PPI initiated.  He was transfused 2 units of packed red blood cells.  On April 08, 2017, altered mental status, more somnolent. Discharged to Acute Care Services.  MRI of the brain, unremarkable.   CT angiogram of the chest showed no pulmonary emboli.  EEG negative. Carotid Dopplers unremarkable.  Initial plan for lumbar puncture discontinued.  Per Neurology Services, workup felt to be altered mental status related to cephalosporin toxicity and his Ancef was discontinued, changed to Unasyn.  Blood cultures followup, no growth.  The patient was readmitted to resume inpatient rehab services.  PAST MEDICAL HISTORY:  See discharge diagnoses.  SOCIAL HISTORY:  Lives alone.  He lives with his nephew, supportive is niece.  Functional status upon admission to [facility], minimal assist, lateral scoot transfers, needing assist for bed mobility, min to moderate assist activities of daily living.  PHYSICAL EXAMINATION:  VITAL SIGNS:  Blood pressure 128/90, pulse 103, temperature 98, respirations 16. GENERAL:  This was an alert male.  He was able to recall being on rehab. Provides his name and place. NECK:  Supple.  Nontender.  No JVD. CARDIAC:  Rate controlled. ABDOMEN:  Soft, nontender.  Good bowel sounds. LUNGS:  Clear to auscultation without wheeze. EXTREMITIES:  Right BKA site was dressed with a wound VAC in place.  REHABILITATION HOSPITAL COURSE:  The patient was admitted to Inpatient Rehab Services.  Therapies initiated on a 3-hour daily basis consisting of physical therapy, occupational therapy, speech therapy, and rehabilitation nursing.  The following issues were  addressed during the patient's rehabilitation stay.  Pertaining to Trevor [name]'s right transtibial amputation, surgical site healing nicely.  Plan was discharge to home and follow up with Orthopedic Services, Trevor [name]. Subcutaneous Lovenox for DVT prophylaxis.  No bleeding episodes.  Pain management with low-dose Ultram and good results.  He had completed his course of Unasyn for viridans strep bacteremia.  He remained afebrile. Acute blood loss anemia.  Conservative care.  Hemoglobin 9.3.  He would remain on  Protonix.  Blood pressures controlled with present regimen and followup with PCP.  Noted diabetes mellitus.  Hemoglobin A1c of 12.5, insulin therapy as directed with full education and insulin therapy. The patient's CKD stage 3, creatinine stable at 1.44.  Bouts of hypokalemia with latest potassium level 3.4.  Patient discharged on 30 mEq 3 times daily of potassium with follow-up chemistries one week.  The patient received weekly collaborative interdisciplinary team conferences to discuss estimated length of stay, family teaching, and any barriers to discharge.  Propels his wheelchair without cues, sit-to-stand transfers without cues, standing balance improved, ambulating 25 feet distance supervision with rolling walker, gather belongings for activities of daily living and homemaking.  The patient is a supervision level.  There was initially some thought that skilled nursing facility not needed due to supervision level.  This was discussed with the patient and family. The patient has a ramp to his home and family has widened his bathroom door.  Plan was for discharge to home on April 28, 2017.  DISCHARGE MEDICATIONS: 1. Tenormin 50 mg p.o. b.i.d. 2. Chlorthalidone 12.5 mg p.o. daily. 3. Levemir 34 units at bedtime. 4. NovoLog 10 units t.i.d. 5. Cozaar 100 mg p.o. daily. 6. Magnesium oxide 200 mg p.o. daily. 7. Procardia 90 mg p.o. daily. 8. Protonix 40 mg p.o. b.i.d. 9. Mylicon 80 mg p.o. q.i.d. as needed for gas. 10.Ultram 50 mg every 8 hours as needed pain. 11.  Potassium chloride 30 mEq 3 times daily  DIET:  His diet was a diabetic diet.  FOLLOWUP:  He would follow up Dr. Delice [name] at the Outpatient Rehab Service office as directed; Dr. Meridee [name], call for appointment; and Dr. Geryl [name] of [facility] and [facility].    SPECIAL INSTRUCTIONS:  Keep BKA site clean and dry. Home health nurse to check BMET 1 week to monitor potassium call results to  Dr. Delice [name] [phone]        Lauraine Rinne, P.A.   ______________________________ Trevor Lesch, MD    DA/MEDQ  D:  04/27/2017  T:  04/27/2017  Job:  [id number]  cc:   Trevor Minion, MD Trevor Lesch, MD

## 2017-04-27 NOTE — Progress Notes (Signed)
Occupational Therapy Discharge Summary  Patient Details  Name: Trevor [name] MRN: [id number] Date of Birth: Mar 31, 1955  Patient has met 9 of 9 long term goals due to improved activity tolerance, improved balance, postural control, ability to compensate for deficits and improved awareness.  Patient to discharge at overall Modified Independent level.  Patient's care partner is independent to provide the necessary intermittent supervision assistance at discharge, recommend supervision for tub/shower transfers and bathing at shower level.    Reasons goals not met: N/A  Recommendation:  Patient will benefit from ongoing skilled OT services in home health setting to continue to advance functional skills in the area of BADL and Reduce care partner burden.  Equipment: tub transfer bench and wide drop arm BSC  Reasons for discharge: treatment goals met and discharge from hospital  Patient/family agrees with progress made and goals achieved: Yes  OT Discharge Precautions/Restrictions  Precautions Precautions: Fall Required Braces or Orthoses: Other Brace/Splint Other Brace/Splint: shrinker and residual limb protector Restrictions Weight Bearing Restrictions: Yes RLE Weight Bearing: Non weight bearing General   Vital Signs Therapy Vitals Temp: 98.2 F (36.8 C) Temp Source: Oral Pulse Rate: 64 Resp: 16 BP: 117/77 Patient Position (if appropriate): Lying Oxygen Therapy SpO2: 100 % O2 Device: Room Air Pain Pain Assessment Pain Scale: 0-10 Pain Score: 0-No pain ADL ADL ADL Comments: see functional navigator Vision Baseline Vision/History: Wears glasses Wears Glasses: At all times Patient Visual Report: No change from baseline Vision Assessment?: Vision impaired- to be further tested in functional context Additional Comments: Pt legally blind Perception  Perception: Within Functional Limits Praxis Praxis: Intact Cognition Overall Cognitive Status: Within Functional  Limits for tasks assessed Arousal/Alertness: Awake/alert Orientation Level: Oriented X4 Attention: Selective Selective Attention: Appears intact Memory: Appears intact Awareness: Appears intact Problem Solving: Appears intact Organizing: Appears intact Self Monitoring: Appears intact Self Correcting: Appears intact Safety/Judgment: Appears intact Sensation Sensation Light Touch: Impaired by gross assessment(reports mild parathesia in distal BLE) Proprioception: Appears Intact Additional Comments: reports occasional phantom sensations, as well as decreased appreciation to light tough.  Coordination Gross Motor Movements are Fluid and Coordinated: Yes Fine Motor Movements are Fluid and Coordinated: Yes Finger Nose Finger Test: Visual deficts limit smoothness Extremity/Trunk Assessment RUE Assessment RUE Assessment: Within Functional Limits LUE Assessment LUE Assessment: Within Functional Limits   See Function Navigator for Current Functional Status.  Simonne Come 04/27/2017, 4:13 PM

## 2017-04-27 NOTE — Progress Notes (Signed)
[place] PHYSICAL MEDICINE & REHABILITATION     PROGRESS NOTE  Subjective/Complaints:  Patient seen lying in bed this morning. He states he slept fairly overnight. When informed about his discharge date, he states he prefers to leave on Friday, stating that Saturday is a "high day."  ROS: Denies CP, SOB, N/V/D.  Objective: Vital Signs: Blood pressure (!) 145/86, pulse 70, temperature 98.4 F (36.9 C), temperature source Oral, resp. rate 18, height 6\' 4"  (1.93 m), weight 130 kg (286 lb 9.6 oz), SpO2 99 %. No results found. Recent Labs    04/24/17 0931  WBC 4.1  HGB 9.3*  HCT 29.6*  PLT 314   Recent Labs    04/24/17 0931 04/26/17 0554  NA 138  --   K 3.5  --   CL 105  --   GLUCOSE 197*  --   BUN 14  --   CREATININE 1.32* 1.44*  CALCIUM 8.8*  --    CBG (last 3)  Recent Labs    04/26/17 1654 04/26/17 2028 04/27/17 0546  GLUCAP 78 170* 175*    Wt Readings from Last 3 Encounters:  04/12/17 130 kg (286 lb 9.6 oz)  04/08/17 (!) 138.3 kg (304 lb 14.3 oz)  04/08/17 (!) 137.2 kg (302 lb 8 oz)    Physical Exam:  BP (!) 145/86 (BP Location: Left Arm)   Pulse 70   Temp 98.4 F (36.9 C) (Oral)   Resp 18   Ht 6\' 4"  (1.93 m)   Wt 130 kg (286 lb 9.6 oz)   SpO2 99%   BMI 34.89 kg/m  Constitutional: No distress . Vital signs reviewed. HEENT: EOMI, oral membranes moist Cardiovascular: RRR. No JVD    Respiratory: CTA Bilaterally. Normal effort    GI: BS +, non-distended   Musculoskeletal: Stump less tender Neurological: He is alert and oriented.  Followed simple commands. Motor: B/l UE, LLE 5/5 proximal to distal LLE: 5/5 proximal to distal (stable) Right HF: 5/5 (unchanged) Skin: stump with dressing C/D/I Psychiatric: Alert and appropriate   Assessment/Plan: 1. Functional deficits secondary to right BKA which require 3+ hours per day of interdisciplinary therapy in a comprehensive inpatient rehab setting. Physiatrist is providing close team supervision and 24  hour management of active medical problems listed below. Physiatrist and rehab team continue to assess barriers to discharge/monitor patient progress toward functional and medical goals.  Function:  Bathing Bathing position   Position: Shower  Bathing parts Body parts bathed by patient: Right arm, Left arm, Abdomen, Chest, Front perineal area, Buttocks, Right upper leg, Left upper leg, Left lower leg Body parts bathed by helper: Back  Bathing assist Assist Level: Supervision or verbal cues, Set up   Set up : To obtain items  Upper Body Dressing/Undressing Upper body dressing   What is the patient wearing?: Watson over shirt/dress - Perfomed by patient: Thread/unthread left sleeve, Thread/unthread right sleeve, Put head through opening, Pull shirt over trunk          Upper body assist Assist Level: Set up   Set up : To obtain clothing/put away  Lower Body Dressing/Undressing Lower body dressing   What is the patient wearing?: Pants, Non-skid slipper socks     Pants- Performed by patient: Thread/unthread right pants leg, Pull pants up/down, Thread/unthread left pants leg   Non-skid slipper socks- Performed by patient: Don/doff left sock  Lower body assist Assist for lower body dressing: More than reasonable time      Toileting Toileting   Toileting steps completed by patient: Adjust clothing prior to toileting, Performs perineal hygiene, Adjust clothing after toileting Toileting steps completed by helper: Adjust clothing after toileting Toileting Assistive Devices: Grab bar or rail  Toileting assist Assist level: Supervision or verbal cues   Transfers Chair/bed transfer   Chair/bed transfer method: Squat pivot Chair/bed transfer assist level: No Help, no cues, assistive device, takes more than a reasonable amount of time Chair/bed transfer assistive device: Armrests     Locomotion Ambulation     Max distance: 25 Assist  level: Supervision or verbal cues   Wheelchair   Type: Manual Max wheelchair distance: 363ft Assist Level: No help, No cues, assistive device, takes more than reasonable amount of time  Cognition Comprehension Comprehension assist level: Understands basic 90% of the time/cues < 10% of the time  Expression Expression assist level: Expresses basic 90% of the time/requires cueing < 10% of the time.  Social Interaction Social Interaction assist level: Interacts appropriately 90% of the time - Needs monitoring or encouragement for participation or interaction.  Problem Solving Problem solving assist level: Solves basic 90% of the time/requires cueing < 10% of the time  Memory Memory assist level: Recognizes or recalls 75 - 89% of the time/requires cueing 10 - 24% of the time    Medical Problem List and Plan: 1.  Decreased functional mobility secondary to right transtibial amputation 0/86/5784 complicated by acute encephalopathy related to Cephalosporin toxicity.     Continue CIR, per nursing, patient to high-level for SNF, will need to be mod I prior to discharge  Limb guard to protect incision/leg  2.  DVT Prophylaxis/Anticoagulation: Subcutaneous Lovenox resumed for DVT prophylaxis 3. Pain Management: Tylenol as needed   Tolerating low-dose tramadol as needed 4. Mood: Provide emotional support 5. Neuropsych: This patient is capable of making decisions on his own behalf. 6. Skin/Wound Care: Routine skin checks 7. Fluids/Electrolytes/Nutrition: Routine I/O's 8.  ID.  Viridans strep bacteremia.  Unasyn after Cephalosporin toxicity completed on 4/10.  9.  Anemia/melena.  Conservative care by GI.  Continue Protonix twice daily   Hemoglobin 9.3 on 4/15  Labs ordered for tomorrow   Cont to monitor 10.  Hypertension.  Cozaar 100 mg daily, Procardia 90 mg daily.  Monitor with increased mobility   Chlorthalidone 12.5 started on 4/14   Atenolol 25 daily started on 4/16, increased to 50 on 4/17    Labile on 4/18 11.  Diabetes mellitus with peripheral neuropathy.  Hemoglobin A1c 12.5.     Levemir 40 units nightly, decreased to 30 on 4/14, increased to 34 on 4/16   NovoLog 10 units 3 times daily, increased to 13 on 4/14, decreased to 10 on 4/17.     Check blood sugars before meals and at bedtime.   Labile on 4/18 12.  Constipation.  Laxative assistance 13.  Legally blind. 14.  CKD stage III.    Creatinine 1.44 on 4/17   Labs ordered for tomorrow   Encourage fluids 15. Hypokalemia   Potassium 3.5 on 4/15  Labs ordered for tomorrow  Daily supplement increased again on 4/11  Magnesium 1.7 on 4/15, supplement initiated 16. Hypoalbuminemia  Supplement initiated on 4/4 17. Gas  Simethicone Scheduled on 4/12   Improved  LOS (Days) 15 A FACE TO FACE EVALUATION WAS PERFORMED  Tenise Stetler Lorie Phenix 04/27/2017 7:46 AM

## 2017-04-27 NOTE — Patient Care Conference (Signed)
Inpatient RehabilitationTeam Conference and Plan of Care Update Date: 04/26/2017   Time: 2:30 PM    Patient Name: Trevor [name]      Medical Record Number: [id number]  Date of Birth: 12-19-1955 Sex: Male         Room/Bed: 4M01C/4M01C-01 Payor Info: Payor: MEDICAID POTENTIAL / Plan: MEDICAID POTENTIAL / Product Type: *No Product type* /    Admitting Diagnosis: BKA  Admit Date/Time:  04/12/2017  6:40 PM Admission Comments: No comment available   Primary Diagnosis:  Unilateral complete BKA, right, sequela (HCC) Principal Problem: Unilateral complete BKA, right, sequela [facility])  Patient Active Problem List   Diagnosis Date Noted  . Poorly controlled type 2 diabetes mellitus with peripheral neuropathy ([place])   . Flatulence   . Hypomagnesemia   . Unilateral complete BKA, right, sequela ([place])   . Benign essential HTN   . Hypoalbuminemia due to protein-calorie malnutrition ([place])   . S/P below knee amputation, right ([place]) 04/12/2017  . Acute blood loss anemia   . Other encephalopathy 04/08/2017  . Encephalopathy 04/08/2017  . Altered mental status 04/08/2017  . Labile blood pressure   . Labile blood glucose   . Drug induced constipation   . Stage 3 chronic kidney disease ([place])   . Bacteremia   . S/P unilateral BKA (below knee amputation), right ([place]) 04/04/2017  . PAD (peripheral artery disease) ([place])   . Type 2 diabetes mellitus with right diabetic foot ulcer ([place])   . Post-operative pain   . Legally blind   . Upper GI bleed   . Streptococcal bacteremia 04/01/2017  . Hypokalemia 03/30/2017  . Uncontrolled type 2 diabetes mellitus with hyperglycemia, with long-term current use of insulin ([place]) 03/30/2017  . Type 2 diabetes mellitus with peripheral neuropathy ([place]) 03/30/2017  . AKI (acute kidney injury) ([place]) 03/30/2017  . CKD stage 3 due to type 2 diabetes mellitus ([place]) 03/30/2017  . Sepsis ([place]) 03/30/2017  . Heart murmur 11/22/2016  . Hyperlipidemia associated with type 2  diabetes mellitus ([place]) 11/22/2016  . Obstructive sleep apnea syndrome 11/22/2016  . Essential hypertension 12/17/2012    Expected Discharge Date: Expected Discharge Date: 04/29/17  Team Members Present: Physician leading conference: Dr. Delice Lesch Social Worker Present: Lennart Pall, LCSW Nurse Present: Other (comment)(Angelina Hollice Espy, RN) PT Present: Barrie Folk, PT OT Present: Simonne Come, OT SLP Present: Windell Moulding, SLP PPS Coordinator present : [name], RN, CRRN     Current Status/Progress Goal Weekly Team Focus  Medical   Decreased functional mobility secondary to right transtibial amputation 05/06/8339 complicated by acute encephalopathy related to Cephalosporin toxicity.  Improve mobility, transfers, BP/HR, DM, CKD  See above   Bowel/Bladder   Continent of B/B LBM 4/17  Maintain continence  Assist as needed   Swallow/Nutrition/ Hydration             ADL's   Supervision bathing/dressing at shower level, Supervision toilet transfers and min guard toileting  Supervision/setup shower, Mod I toileting and toilet transfers  dynamic standing balance for toileting and LB dressing, transfers   Mobility   Mod I bed mobility. Mod I squat pivot transfers. supervision  assist stand pivot transfers, Supervision assist ambulation with RW x 69ft. supervision-mod I WC mobility.   Mod I transfers, bed mobility, and WC mobility. Supevision assist ambulation.   Improved safety with with transfers and WC mobility in functional environment    Communication             Safety/Cognition/ Behavioral Observations  Pain   C/o pain 3/10 at surgical site  Pain < 3  Assess Qshift and PRN   Skin   No drainage at surgical site, flaking/peeling skin to buttocks and perineum  Prevent infection and breakdown  Assess Qshift and PRN    Rehab Goals Patient on target to meet rehab goals: Yes *See Care Plan and progress notes for long and short-term goals.     Barriers to Discharge   Current Status/Progress Possible Resolutions Date Resolved   Physician    Medical stability;Wound Care;Weight bearing restrictions     See above  Therapies, follow labs, optimize HTN/DM/HR meds      Nursing                  PT  Decreased caregiver support                 OT                  SLP                SW                Discharge Planning/Teaching Needs:  Pt continues to make gains and team feels can re-establish mod ind goals.  Plan to d/c home alone.  NA   Team Discussion:  Team nows believes it is reasonable to plan for mod ind goals except supervision for ambulation.  Home is w/c accessible with completed modifications.  Pt agreeable with this plan for home d/c.  Revisions to Treatment Plan:  Most goals upgraded to mod independent    Continued Need for Acute Rehabilitation Level of Care: The patient requires daily medical management by a physician with specialized training in physical medicine and rehabilitation for the following conditions: Daily direction of a multidisciplinary physical rehabilitation program to ensure safe treatment while eliciting the highest outcome that is of practical value to the patient.: Yes Daily medical management of patient stability for increased activity during participation in an intensive rehabilitation regime.: Yes Daily analysis of laboratory values and/or radiology reports with any subsequent need for medication adjustment of medical intervention for : Post surgical problems;Wound care problems;Blood pressure problems;Renal problems;Diabetes problems;Other  Winnell Bento 04/27/2017, 2:08 PM

## 2017-04-27 NOTE — Progress Notes (Signed)
Social Work Patient ID: Trevor [name], male   DOB: 1955-12-05, 62 y.o.   MRN: [id number]   Spoke with pt yesterday and left VM for niece.  Pt aware and agreeable with team now feels with his gains that most goals can be upgraded to modified independent.  He understands that ambulation and shower goals are still for supervision.  He feels he has made good progress and planning for d/c tomorrow.  He also understands that, under Medicaid guidelines, he does not have a qualifying diagnosis for [facility] therapies.  I will refer for [facility] visits.  Continue to follow.  Aiman Noe, LCSW

## 2017-04-28 DIAGNOSIS — R7309 Other abnormal glucose: Secondary | ICD-10-CM

## 2017-04-28 DIAGNOSIS — D62 Acute posthemorrhagic anemia: Secondary | ICD-10-CM

## 2017-04-28 DIAGNOSIS — E876 Hypokalemia: Secondary | ICD-10-CM

## 2017-04-28 DIAGNOSIS — E1165 Type 2 diabetes mellitus with hyperglycemia: Secondary | ICD-10-CM

## 2017-04-28 LAB — CBC WITH DIFFERENTIAL/PLATELET
BASOS ABS: 0 10*3/uL (ref 0.0–0.1)
BASOS PCT: 0 %
Eosinophils Absolute: 0.1 10*3/uL (ref 0.0–0.7)
Eosinophils Relative: 3 %
HEMATOCRIT: 30.6 % — AB (ref 39.0–52.0)
HEMOGLOBIN: 9.5 g/dL — AB (ref 13.0–17.0)
Lymphocytes Relative: 36 %
Lymphs Abs: 1.7 10*3/uL (ref 0.7–4.0)
MCH: 26.7 pg (ref 26.0–34.0)
MCHC: 31 g/dL (ref 30.0–36.0)
MCV: 86 fL (ref 78.0–100.0)
MONOS PCT: 12 %
Monocytes Absolute: 0.6 10*3/uL (ref 0.1–1.0)
NEUTROS ABS: 2.4 10*3/uL (ref 1.7–7.7)
NEUTROS PCT: 49 %
Platelets: 327 10*3/uL (ref 150–400)
RBC: 3.56 MIL/uL — AB (ref 4.22–5.81)
RDW: 15.6 % — ABNORMAL HIGH (ref 11.5–15.5)
WBC: 4.8 10*3/uL (ref 4.0–10.5)

## 2017-04-28 LAB — BASIC METABOLIC PANEL
ANION GAP: 9 (ref 5–15)
BUN: 27 mg/dL — ABNORMAL HIGH (ref 6–20)
CO2: 22 mmol/L (ref 22–32)
Calcium: 8.9 mg/dL (ref 8.9–10.3)
Chloride: 107 mmol/L (ref 101–111)
Creatinine, Ser: 1.56 mg/dL — ABNORMAL HIGH (ref 0.61–1.24)
GFR, EST AFRICAN AMERICAN: 54 mL/min — AB (ref >60)
GFR, EST NON AFRICAN AMERICAN: 47 mL/min — AB (ref >60)
Glucose, Bld: 147 mg/dL — ABNORMAL HIGH (ref 65–99)
Potassium: 3.4 mmol/L — ABNORMAL LOW (ref 3.5–5.1)
Sodium: 138 mmol/L (ref 135–145)

## 2017-04-28 LAB — GLUCOSE, CAPILLARY
Glucose-Capillary: 152 mg/dL — ABNORMAL HIGH (ref 65–99)
Glucose-Capillary: 154 mg/dL — ABNORMAL HIGH (ref 65–99)

## 2017-04-28 MED ORDER — POTASSIUM CHLORIDE CRYS ER 15 MEQ PO TBCR: 30.0000 meq | EXTENDED_RELEASE_TABLET | Freq: Three times a day (TID) | ORAL | 0 refills | Status: DC

## 2017-04-28 MED ORDER — POTASSIUM CHLORIDE CRYS ER 20 MEQ PO TBCR
30.0000 meq | EXTENDED_RELEASE_TABLET | Freq: Three times a day (TID) | ORAL | Status: DC
  Administered 2017-04-28: 30 meq via ORAL
  Filled 2017-04-28: qty 1

## 2017-04-28 NOTE — Progress Notes (Signed)
[place] PHYSICAL MEDICINE & REHABILITATION     PROGRESS NOTE  Subjective/Complaints:  Pt seen lying in bed this AM.  He slept well overnight.  He states he is ready for discharge.   ROS: Denies CP, SOB, N/V/D.  Objective: Vital Signs: Blood pressure 137/67, pulse 62, temperature 97.8 F (36.6 C), temperature source Oral, resp. rate 12, height 6\' 4"  (1.93 m), weight 130 kg (286 lb 9.6 oz), SpO2 97 %. No results found. Recent Labs    04/28/17 0527  WBC 4.8  HGB 9.5*  HCT 30.6*  PLT 327   Recent Labs    04/26/17 0554 04/28/17 0527  NA  --  138  K  --  3.4*  CL  --  107  GLUCOSE  --  147*  BUN  --  27*  CREATININE 1.44* 1.56*  CALCIUM  --  8.9   CBG (last 3)  Recent Labs    04/27/17 1700 04/27/17 2130 04/28/17 0602  GLUCAP 101* 211* 152*    Wt Readings from Last 3 Encounters:  04/12/17 130 kg (286 lb 9.6 oz)  04/08/17 (!) 138.3 kg (304 lb 14.3 oz)  04/08/17 (!) 137.2 kg (302 lb 8 oz)    Physical Exam:  BP 137/67 (BP Location: Right Arm)   Pulse 62   Temp 97.8 F (36.6 C) (Oral)   Resp 12   Ht 6\' 4"  (1.93 m)   Wt 130 kg (286 lb 9.6 oz)   SpO2 97%   BMI 34.89 kg/m  Constitutional: No distress . Vital signs reviewed. HEENT: EOMI, oral membranes moist Cardiovascular: RRR. No JVD    Respiratory: CTA Bilaterally. Normal effort    GI: BS +, non-distended   Musculoskeletal: Stump less tender Neurological: He is alert and oriented.  Followed simple commands. Motor: B/l UE, LLE 5/5 proximal to distal LLE: 5/5 proximal to distal (unchanged) Right HF: 5/5 (unchanged) Skin: stump with dressing C/D/I Psychiatric: Alert and appropriate   Assessment/Plan: 1. Functional deficits secondary to right BKA which require 3+ hours per day of interdisciplinary therapy in a comprehensive inpatient rehab setting. Physiatrist is providing close team supervision and 24 hour management of active medical problems listed below. Physiatrist and rehab team continue to assess  barriers to discharge/monitor patient progress toward functional and medical goals.  Function:  Bathing Bathing position   Position: Wheelchair/chair at sink  Bathing parts Body parts bathed by patient: Right arm, Left arm, Abdomen, Chest, Front perineal area, Buttocks, Right upper leg, Left upper leg, Left lower leg, Back Body parts bathed by helper: Back  Bathing assist Assist Level: More than reasonable time   Set up : To obtain items  Upper Body Dressing/Undressing Upper body dressing   What is the patient wearing?: Pull over shirt/dress     Pull over shirt/dress - Perfomed by patient: Thread/unthread left sleeve, Thread/unthread right sleeve, Put head through opening, Pull shirt over trunk          Upper body assist Assist Level: No help, No cues   Set up : To obtain clothing/put away  Lower Body Dressing/Undressing Lower body dressing   What is the patient wearing?: Underwear, Pants, Non-skid slipper socks Underwear - Performed by patient: Thread/unthread right underwear leg, Thread/unthread left underwear leg, Pull underwear up/down   Pants- Performed by patient: Thread/unthread right pants leg, Thread/unthread left pants leg, Pull pants up/down   Non-skid slipper socks- Performed by patient: Don/doff left sock  Lower body assist Assist for lower body dressing: More than reasonable time      Toileting Toileting   Toileting steps completed by patient: Adjust clothing prior to toileting, Performs perineal hygiene, Adjust clothing after toileting Toileting steps completed by helper: Adjust clothing after toileting Toileting Assistive Devices: Grab bar or rail  Toileting assist Assist level: Supervision or verbal cues   Transfers Chair/bed transfer   Chair/bed transfer method: Squat pivot Chair/bed transfer assist level: No Help, no cues, assistive device, takes more than a reasonable amount of time Chair/bed transfer assistive device:  Armrests     Locomotion Ambulation     Max distance: 49ft Assist level: Supervision or verbal cues   Wheelchair   Type: Manual Max wheelchair distance: 266ft  Assist Level: No help, No cues, assistive device, takes more than reasonable amount of time  Cognition Comprehension Comprehension assist level: Understands complex 90% of the time/cues 10% of the time  Expression Expression assist level: Expresses complex ideas: With extra time/assistive device  Social Interaction Social Interaction assist level: Interacts appropriately 90% of the time - Needs monitoring or encouragement for participation or interaction.  Problem Solving Problem solving assist level: Solves complex 90% of the time/cues < 10% of the time  Memory Memory assist level: More than reasonable amount of time    Medical Problem List and Plan: 1.  Decreased functional mobility secondary to right transtibial amputation 1/82/9937 complicated by acute encephalopathy related to Cephalosporin toxicity.     D/c today  Will see patient for transitional care management in 1-2 weeks  Limb guard to protect incision/leg  2.  DVT Prophylaxis/Anticoagulation: Subcutaneous Lovenox resumed for DVT prophylaxis 3. Pain Management: Tylenol as needed   Tolerating low-dose tramadol as needed 4. Mood: Provide emotional support 5. Neuropsych: This patient is capable of making decisions on his own behalf. 6. Skin/Wound Care: Routine skin checks 7. Fluids/Electrolytes/Nutrition: Routine I/O's 8.  ID.  Viridans strep bacteremia.  Unasyn after Cephalosporin toxicity completed on 4/10.  9.  Anemia/melena.  Conservative care by GI.  Continue Protonix twice daily   Hemoglobin 9.5 on 4/19   Cont to monitor 10.  Hypertension.  Cozaar 100 mg daily, Procardia 90 mg daily.  Monitor with increased mobility   Chlorthalidone 12.5 started on 4/14   Atenolol 25 daily started on 4/16, increased to 50 on 4/17   Improved on 4/19 11.  Diabetes mellitus  with peripheral neuropathy.  Hemoglobin A1c 12.5.     Levemir 40 units nightly, decreased to 30 on 4/14, increased to 34 on 4/16   NovoLog 10 units 3 times daily, increased to 13 on 4/14, decreased to 10 on 4/17.     Check blood sugars before meals and at bedtime.   Labile on 4/19, will need ambulatory adjustments 12.  Constipation.  Laxative assistance 13.  Legally blind. 14.  CKD stage III.    Creatinine 1.56 on 4/19   Encourage fluids 15. Hypokalemia   Potassium 3.4 on 4/19  Daily supplement increased again on 4/11, again on 4/19  Magnesium 1.7 on 4/15, supplement initiated 16. Hypoalbuminemia  Supplement initiated on 4/4 17. Gas  Simethicone Scheduled on 4/12   Improved  LOS (Days) 16 A FACE TO FACE EVALUATION WAS PERFORMED  Trevor [name] Trevor [name] 04/28/2017 7:43 AM

## 2017-04-28 NOTE — Discharge Instructions (Signed)
Inpatient Rehab Discharge Instructions  Trevor [name] Discharge date and time: No discharge date for patient encounter.   Activities/Precautions/ Functional Status: Activity: activity as tolerated Diet: diabetic diet Wound Care: keep wound clean and dry Functional status:  ___ No restrictions     ___ Walk up steps independently ___ 24/7 supervision/assistance   ___ Walk up steps with assistance ___ Intermittent supervision/assistance  ___ Bathe/dress independently ___ Walk with walker     _x__ Bathe/dress with assistance ___ Walk Independently    ___ Shower independently ___ Walk with assistance    ___ Shower with assistance ___ No alcohol     ___ Return to work/school ________    COMMUNITY REFERRALS UPON DISCHARGE:    Home Health:    RN      SW                      Agency:  [facility]  Phone: [phone]   Medical Equipment/Items Ordered:  Wheelchair, cushion, walker, drop arm commode and tub bench                                                      Agency/Supplier:  [facility] @ [phone]   GENERAL COMMUNITY RESOURCES FOR PATIENT/FAMILY:  Support Groups:  Amputee Support Group  (handout)        Special Instructions:    My questions have been answered and I understand these instructions. I will adhere to these goals and the provided educational materials after my discharge from the hospital.  Patient/Caregiver Signature _______________________________ Date __________  Clinician Signature _______________________________________ Date __________  Please bring this form and your medication list with you to all your follow-up doctor's appointments.

## 2017-04-28 NOTE — Progress Notes (Signed)
Patient and family discussed all the discharged instructions with  Trevor [name] before they left the hospital.

## 2017-05-01 NOTE — Progress Notes (Signed)
Social Work  Discharge Note  The overall goal for the admission was met for:   Discharge location: Yes - home with intermittent support of niece/ family  Length of Stay: Yes - 16 days  Discharge activity level: Yes - modified independent wheelchair level overall (pt/fam aware recommendation for supervision with ambulation and showering)  Home/community participation: Yes  Services provided included: MD, RD, PT, OT, SLP, RN, TR, Pharmacy, Neuropsych and SW  Financial Services: None - Medicaid and SSD applications pending  Follow-up services arranged: Home Health: [facility] via [place], DME: 20x18 lightweight w/c with right amputee support pad, basic cushion, wide rolling walker, wide drop arm commode and tub bench via [place] and Patient/Family has no preference for HH/DME agencies  Comments (or additional information): Pt aware that he does not have a qualifying diagnosis under Medicaid guidelines for [facility] therapies.  He is unable to pay privately.  Understands only service arranged is nursing.  Patient/Family verbalized understanding of follow-up arrangements: Yes  Individual responsible for coordination of the follow-up plan: pt  Confirmed correct DME delivered: Cael Worth 05/01/2017    Kelden Lavallee

## 2017-05-04 ENCOUNTER — Telehealth (INDEPENDENT_AMBULATORY_CARE_PROVIDER_SITE_OTHER): Payer: Self-pay | Admitting: Orthopedic Surgery

## 2017-05-04 NOTE — Telephone Encounter (Signed)
I called and sw HHN Amy to advise of for orders below. Also verbal ok for physical therapy to eval and treat for safety assessment  in home  S/p BKA

## 2017-05-04 NOTE — Telephone Encounter (Signed)
Amy, nurse with [facility] said patient was admitted yesterday and she needs verbal approval for plan of care  1 x for 3 weeks  CB # [phone]

## 2017-05-10 ENCOUNTER — Telehealth (INDEPENDENT_AMBULATORY_CARE_PROVIDER_SITE_OTHER): Payer: Self-pay | Admitting: Orthopedic Surgery

## 2017-05-10 NOTE — Telephone Encounter (Signed)
Jana Half, PT, with [facility] called requesting VO for the following:  1x a week for 3 weeks.  CB#[phone].  Thank you.

## 2017-05-11 ENCOUNTER — Encounter (INDEPENDENT_AMBULATORY_CARE_PROVIDER_SITE_OTHER): Payer: Self-pay | Admitting: Orthopedic Surgery

## 2017-05-11 ENCOUNTER — Encounter: Payer: Medicaid Other | Attending: Physical Medicine & Rehabilitation | Admitting: Physical Medicine & Rehabilitation

## 2017-05-11 ENCOUNTER — Encounter: Payer: Self-pay | Admitting: Physical Medicine & Rehabilitation

## 2017-05-11 ENCOUNTER — Ambulatory Visit (INDEPENDENT_AMBULATORY_CARE_PROVIDER_SITE_OTHER): Payer: Self-pay | Admitting: Orthopedic Surgery

## 2017-05-11 ENCOUNTER — Telehealth (INDEPENDENT_AMBULATORY_CARE_PROVIDER_SITE_OTHER): Payer: Self-pay | Admitting: Orthopedic Surgery

## 2017-05-11 VITALS — BP 149/83 | HR 71 | Resp 14

## 2017-05-11 DIAGNOSIS — G4730 Sleep apnea, unspecified: Secondary | ICD-10-CM | POA: Diagnosis not present

## 2017-05-11 DIAGNOSIS — H548 Legal blindness, as defined in USA: Secondary | ICD-10-CM | POA: Insufficient documentation

## 2017-05-11 DIAGNOSIS — R269 Unspecified abnormalities of gait and mobility: Secondary | ICD-10-CM | POA: Diagnosis not present

## 2017-05-11 DIAGNOSIS — S88111S Complete traumatic amputation at level between knee and ankle, right lower leg, sequela: Secondary | ICD-10-CM

## 2017-05-11 DIAGNOSIS — Z89511 Acquired absence of right leg below knee: Secondary | ICD-10-CM | POA: Diagnosis not present

## 2017-05-11 DIAGNOSIS — N189 Chronic kidney disease, unspecified: Secondary | ICD-10-CM | POA: Insufficient documentation

## 2017-05-11 DIAGNOSIS — E785 Hyperlipidemia, unspecified: Secondary | ICD-10-CM | POA: Insufficient documentation

## 2017-05-11 DIAGNOSIS — E876 Hypokalemia: Secondary | ICD-10-CM | POA: Diagnosis not present

## 2017-05-11 DIAGNOSIS — E1142 Type 2 diabetes mellitus with diabetic polyneuropathy: Secondary | ICD-10-CM | POA: Diagnosis not present

## 2017-05-11 DIAGNOSIS — K921 Melena: Secondary | ICD-10-CM | POA: Insufficient documentation

## 2017-05-11 DIAGNOSIS — E1122 Type 2 diabetes mellitus with diabetic chronic kidney disease: Secondary | ICD-10-CM | POA: Insufficient documentation

## 2017-05-11 DIAGNOSIS — Z4781 Encounter for orthopedic aftercare following surgical amputation: Secondary | ICD-10-CM | POA: Insufficient documentation

## 2017-05-11 DIAGNOSIS — I10 Essential (primary) hypertension: Secondary | ICD-10-CM | POA: Diagnosis not present

## 2017-05-11 DIAGNOSIS — D649 Anemia, unspecified: Secondary | ICD-10-CM | POA: Diagnosis not present

## 2017-05-11 DIAGNOSIS — I129 Hypertensive chronic kidney disease with stage 1 through stage 4 chronic kidney disease, or unspecified chronic kidney disease: Secondary | ICD-10-CM | POA: Diagnosis not present

## 2017-05-11 DIAGNOSIS — E1165 Type 2 diabetes mellitus with hyperglycemia: Secondary | ICD-10-CM

## 2017-05-11 MED FILL — MAGNESIUM OXIDE 400 MG TAB: 400 (240 MG | 30 days supply | Qty: 15 | Fill #0

## 2017-05-11 MED FILL — traMADol HCL 50 MG TABS: 50 | 10 days supply | Qty: 30 | Fill #0

## 2017-05-11 NOTE — Progress Notes (Signed)
Office Visit Note   Patient: Trevor [name]           Date of Birth: October 21, 1955           MRN: [id number] Visit Date: 05/11/2017              Requested by: [name], NP [address] [place], [place] [address] PCP: [name], NP  Chief Complaint  Patient presents with  . Right Leg - Follow-up      HPI: Patient is a 62 year old gentleman 5 weeks status post right transtibial amputation he has no complaints.  Assessment & Plan: Visit Diagnoses:  1. Acquired absence of right leg below knee [facility])     Plan: Patient was given a note for biotech for a K3 level prosthesis.  Patient was given a note that he would be out of work for at least 6 months.  This may be greater than  a year depending on how patient progresses with his prosthesis.  Follow-Up Instructions: Return in about 1 month (around 06/08/2017).   Ortho Exam  Patient is alert, oriented, no adenopathy, well-dressed, normal affect, normal respiratory effort. Examination the incision is healed nicely and the limb is consolidating well.  We will harvest the sutures and staples today he will continue with the stump shrinker he will wear the limb guard when he is transferring or ambulating.  Imaging: No results found. No images are attached to the encounter.  Labs: Lab Results  Component Value Date   HGBA1C 12.5 03/29/2017   HGBA1C 10.2 11/22/2016   REPTSTATUS 04/14/2017 FINAL 04/09/2017   GRAMSTAIN  03/31/2017    ABUNDANT WBC PRESENT, PREDOMINANTLY PMN ABUNDANT GRAM POSITIVE COCCI MODERATE GRAM NEGATIVE RODS Performed at [facility], [place] [address] [place], [place] [address]    CULT  04/09/2017    NO GROWTH 5 DAYS Performed at [facility] [address] [place], [place] [address]    LABORGA STREPTOCOCCUS ANGINOSIS 03/31/2017    Lab Results  Component Value Date/Time   HGBA1C 12.5 03/29/2017 03:43 PM   HGBA1C 10.2 11/22/2016 11:16 AM    There is no height or weight on  file to calculate BMI.  Orders:  No orders of the defined types were placed in this encounter.  No orders of the defined types were placed in this encounter.    Procedures: No procedures performed  Clinical Data: No additional findings.  ROS:  All other systems negative, except as noted in the HPI. Review of Systems  Objective: Vital Signs: There were no vitals taken for this visit.  Specialty Comments:  No specialty comments available.  PMFS History: Patient Active Problem List   Diagnosis Date Noted  . Poorly controlled type 2 diabetes mellitus with peripheral neuropathy ([place])   . Flatulence   . Hypomagnesemia   . Unilateral complete BKA, right, sequela ([place])   . Benign essential HTN   . Hypoalbuminemia due to protein-calorie malnutrition ([place])   . S/P below knee amputation, right ([place]) 04/12/2017  . Acute blood loss anemia   . Other encephalopathy 04/08/2017  . Encephalopathy 04/08/2017  . Altered mental status 04/08/2017  . Labile blood pressure   . Labile blood glucose   . Drug induced constipation   . Stage 3 chronic kidney disease ([place])   . Bacteremia   . S/P unilateral BKA (below knee amputation), right ([place]) 04/04/2017  . PAD (peripheral artery disease) ([place])   . Type 2 diabetes mellitus  with right diabetic foot ulcer ([place])   . Post-operative pain   . Legally blind   . Upper GI bleed   . Streptococcal bacteremia 04/01/2017  . Hypokalemia 03/30/2017  . Uncontrolled type 2 diabetes mellitus with hyperglycemia, with long-term current use of insulin ([place]) 03/30/2017  . Type 2 diabetes mellitus with peripheral neuropathy ([place]) 03/30/2017  . AKI (acute kidney injury) ([place]) 03/30/2017  . CKD stage 3 due to type 2 diabetes mellitus ([place]) 03/30/2017  . Sepsis ([place]) 03/30/2017  . Heart murmur 11/22/2016  . Hyperlipidemia associated with type 2 diabetes mellitus ([place]) 11/22/2016  . Obstructive sleep apnea syndrome 11/22/2016  . Essential hypertension  12/17/2012   Past Medical History:  Diagnosis Date  . Diabetes mellitus without complication ([place])   . Heart murmur   . Hyperlipidemia   . Hypertension   . Sleep apnea     History reviewed. No pertinent family history.  Past Surgical History:  Procedure Laterality Date  . AMPUTATION Right 03/31/2017   Procedure: RIGHT FOOT 1ST AND 2ND RAY AMPUTATION;  Surgeon: Newt Minion, MD;  Location: [facility];  Service: Orthopedics;  Laterality: Right;  . AMPUTATION Right 04/01/2017   Procedure: AMPUTATION BELOW KNEE;  Surgeon: Newt Minion, MD;  Location: [facility];  Service: Orthopedics;  Laterality: Right;  . CORNEAL TRANSPLANT     Social History   Occupational History  . Not on file  Tobacco Use  . Smoking status: Never Smoker  . Smokeless tobacco: Never Used  Substance and Sexual Activity  . Alcohol use: No  . Drug use: No  . Sexual activity: Not on file

## 2017-05-11 NOTE — Progress Notes (Addendum)
Subjective:    Patient ID: Trevor [name], male    DOB: 06/24/1955, 62 y.o.   MRN: [id number]  HPI 62 year old right-handed male with history of diabetes mellitus, hypertension, legally blind, and CKD presents for hospital follow up after receiving CIR for right BKA.    DATE OF ADMISSION:  04/12/2017 DATE OF DISCHARGE:  04/28/2017  At discharge, he was instructed to follow diabetic diet, which he states he has been.  He sees Ortho today. He sees PCP next week. He has not had his labs checked. Pain is controlled. Denies falls. BP slightly elevated. CBGs have been controlled.  Therapies: 1/week DME: Bedside commode, shower bench Mobility: Wheelchair at all times  Pain Inventory Average Pain hospital f/u Pain Right Now 3 My pain is burning  In the last 24 hours, has pain interfered with the following? General activity 2 Relation with others 1 Enjoyment of life 1 What TIME of day is your pain at its worst? daytime Sleep (in general) Fair  Pain is worse with: unsure Pain improves with: rest and medication Relief from Meds: 10  Mobility ability to climb steps?  no do you drive?  no use a wheelchair transfers alone  Function not employed: date last employed . I need assistance with the following:  bathing, meal prep, household duties and shopping  Neuro/Psych numbness tingling  Physicians involved in your care hospital f/u   History reviewed. No pertinent family history. Social History   Socioeconomic History  . Marital status: Single    Spouse name: Not on file  . Number of children: Not on file  . Years of education: Not on file  . Highest education level: Not on file  Occupational History  . Not on file  Social Needs  . Financial resource strain: Not on file  . Food insecurity:    Worry: Not on file    Inability: Not on file  . Transportation needs:    Medical: Not on file    Non-medical: Not on file  Tobacco Use  . Smoking status: Never Smoker    . Smokeless tobacco: Never Used  Substance and Sexual Activity  . Alcohol use: No  . Drug use: No  . Sexual activity: Not on file  Lifestyle  . Physical activity:    Days per week: Not on file    Minutes per session: Not on file  . Stress: Not on file  Relationships  . Social connections:    Talks on phone: Not on file    Gets together: Not on file    Attends religious service: Not on file    Active member of club or organization: Not on file    Attends meetings of clubs or organizations: Not on file    Relationship status: Not on file  Other Topics Concern  . Not on file  Social History Narrative  . Not on file   Past Surgical History:  Procedure Laterality Date  . AMPUTATION Right 03/31/2017   Procedure: RIGHT FOOT 1ST AND 2ND RAY AMPUTATION;  Surgeon: Newt Minion, MD;  Location: [facility];  Service: Orthopedics;  Laterality: Right;  . AMPUTATION Right 04/01/2017   Procedure: AMPUTATION BELOW KNEE;  Surgeon: Newt Minion, MD;  Location: [facility];  Service: Orthopedics;  Laterality: Right;  . CORNEAL TRANSPLANT     Past Medical History:  Diagnosis Date  . Diabetes mellitus without complication ([place])   . Heart murmur   . Hyperlipidemia   . Hypertension   .  Sleep apnea    BP (!) 149/83 (BP Location: Right Arm, Patient Position: Sitting, Cuff Size: Large)   Pulse 71   Resp 14   SpO2 95%   Opioid Risk Score:   Fall Risk Score:  `1  Depression screen PHQ 2/9  Depression screen [facility] 2/9 05/11/2017 03/29/2017 11/22/2016  Decreased Interest 0 2 0  Down, Depressed, Hopeless 0 0 0  PHQ - 2 Score 0 2 0  Altered sleeping 1 0 1  Tired, decreased energy 0 3 1  Change in appetite 0 1 0  Feeling bad or failure about yourself  0 0 0  Trouble concentrating 0 0 0  Moving slowly or fidgety/restless 0 1 1  Suicidal thoughts 0 0 0  PHQ-9 Score 1 7 3     Review of Systems  Constitutional: Positive for unexpected weight change.  Eyes: Negative.   Respiratory: Negative.    Cardiovascular: Negative.   Gastrointestinal: Negative.   Endocrine: Negative.        High blood sugar  Genitourinary: Negative.   Skin: Negative.   Allergic/Immunologic: Negative.   Neurological: Positive for numbness.       Tingling   All other systems reviewed and are negative.      Objective:   Physical Exam Constitutional: No distress . Vital signs reviewed. HEENT: EOMI, oral membranes moist Cardiovascular: RRR. No JVD    Respiratory: CTA Bilaterally. Normal  effort    GI: BS +, non-distended   Musculoskeletal: Stump less tender Neurological: He is alert and oriented.  Followed simple commands. Motor: B/l UE, LLE 5/5 proximal to distal LLE: 5/5 proximal to distal  Right HF: 5/5  Skin: Stump with dressing C/D/I Psychiatric: Alert and appropriate    Assessment & Plan:  62 year old right-handed male with history of diabetes mellitus, hypertension, legally blind, and CKD presents for hospital follow up after receiving CIR for right BKA.    1.  Right transtibial amputation   Cont therapies  Follow up with Ortho  Follow up with podiatrist for left foot toenails  2. Pain Management  Controlled at present  3.  Anemia/melena.    Follow up with [facility]  4.  Hypertension  Slightly elevated  Cont meds  Follow up with PCP for further adjustments  5.  Diabetes mellitus with peripheral neuropathy.    Cont meds  Appears relatively controlled at present  6. Gait abnormality  Cont therapies  Wheelchair for safety  7. Hypokalemia  Labs ordered  Meds reviewed Referrals reviewed All questions answered

## 2017-05-11 NOTE — Telephone Encounter (Signed)
Patient called requesting a handicap plaquered.  Please call patient when the application is ready.  CB#[phone].  Thank you.

## 2017-05-11 NOTE — Telephone Encounter (Signed)
I called and sw Jana Half and gave verbal ok for physical therapy orders per Dr. Sharol Given.

## 2017-05-12 ENCOUNTER — Telehealth (INDEPENDENT_AMBULATORY_CARE_PROVIDER_SITE_OTHER): Payer: Self-pay

## 2017-05-12 ENCOUNTER — Inpatient Hospital Stay: Payer: Self-pay | Admitting: Physical Medicine & Rehabilitation

## 2017-05-12 ENCOUNTER — Other Ambulatory Visit (INDEPENDENT_AMBULATORY_CARE_PROVIDER_SITE_OTHER): Payer: Self-pay

## 2017-05-12 LAB — BASIC METABOLIC PANEL
BUN / CREAT RATIO: 13 (ref 10–24)
BUN: 17 mg/dL (ref 8–27)
CO2: 23 mmol/L (ref 20–29)
CREATININE: 1.28 mg/dL — AB (ref 0.76–1.27)
Calcium: 9.5 mg/dL (ref 8.6–10.2)
Chloride: 105 mmol/L (ref 96–106)
GFR calc Af Amer: 70 mL/min/{1.73_m2} (ref >59)
GFR calc non Af Amer: 60 mL/min/{1.73_m2} (ref >59)
GLUCOSE: 58 mg/dL — AB (ref 65–99)
Potassium: 3.8 mmol/L (ref 3.5–5.2)
Sodium: 144 mmol/L (ref 134–144)

## 2017-05-12 NOTE — Telephone Encounter (Signed)
I called and lm on vm to advise that form is complete and available at the front desk for pick up

## 2017-05-12 NOTE — Telephone Encounter (Signed)
Pt request that his out of work note is faxed to his disability lawyer Darlin Coco. This was faxed to [phone] he will remain out of work for the next 6 months.

## 2017-05-18 ENCOUNTER — Telehealth (INDEPENDENT_AMBULATORY_CARE_PROVIDER_SITE_OTHER): Payer: Self-pay | Admitting: Orthopedic Surgery

## 2017-05-18 NOTE — Telephone Encounter (Signed)
Sharee Pimple with [facility] is asking for one more visit for this patient, she said he was discharged yesterday 05/17/17 and that today patient was cleaning the incision and rubbed it and now it is hurting so he is asking the nurse to visit him again. Please call Sharee Pimple back with orders for 1 visit.  CB # [phone]

## 2017-05-18 NOTE — Telephone Encounter (Signed)
IC and got NA, will call again tomorrow.

## 2017-05-19 NOTE — Telephone Encounter (Signed)
IC and LMVM for Trevor [name] and advised orders for 1 visit are ok.

## 2017-05-22 ENCOUNTER — Ambulatory Visit (INDEPENDENT_AMBULATORY_CARE_PROVIDER_SITE_OTHER): Payer: Self-pay | Admitting: Nurse Practitioner

## 2017-05-25 ENCOUNTER — Telehealth (INDEPENDENT_AMBULATORY_CARE_PROVIDER_SITE_OTHER): Payer: Self-pay | Admitting: Orthopedic Surgery

## 2017-05-25 ENCOUNTER — Telehealth: Payer: Self-pay | Admitting: *Deleted

## 2017-05-25 NOTE — Telephone Encounter (Signed)
Lynn-(PT) with [facility] called needing verbal orders to extend (PT) 1  Wk 3   The number to contact Jeani Hawking is [phone]

## 2017-05-25 NOTE — Telephone Encounter (Signed)
Pt called asking for ext on HHPT 1week3. Called PT back, and reminded that previous orders were placed by Dr, Gavin Potters office.  PT said she would check with supervisor and call back if needed

## 2017-05-26 NOTE — Telephone Encounter (Signed)
I called and lm on vm to advise verbal ok for extension of physical therapy orders.

## 2017-06-08 ENCOUNTER — Telehealth: Payer: Self-pay | Admitting: Pharmacy Technician

## 2017-06-08 NOTE — Telephone Encounter (Signed)
Received updated proof of income.  Patient eligible to receive medication assistance at [facility] through 2019, as long as eligibility requirements continue to be met.  [facility]

## 2017-06-12 ENCOUNTER — Ambulatory Visit (INDEPENDENT_AMBULATORY_CARE_PROVIDER_SITE_OTHER): Payer: Self-pay | Admitting: Orthopedic Surgery

## 2017-06-21 ENCOUNTER — Ambulatory Visit (INDEPENDENT_AMBULATORY_CARE_PROVIDER_SITE_OTHER): Payer: Self-pay | Admitting: Orthopedic Surgery

## 2017-06-22 ENCOUNTER — Inpatient Hospital Stay (INDEPENDENT_AMBULATORY_CARE_PROVIDER_SITE_OTHER): Payer: Self-pay | Admitting: Physician Assistant

## 2017-06-26 ENCOUNTER — Encounter: Payer: Self-pay | Admitting: Nurse Practitioner

## 2017-06-26 ENCOUNTER — Ambulatory Visit (INDEPENDENT_AMBULATORY_CARE_PROVIDER_SITE_OTHER): Payer: Medicaid Other | Admitting: Orthopedic Surgery

## 2017-06-26 ENCOUNTER — Encounter (INDEPENDENT_AMBULATORY_CARE_PROVIDER_SITE_OTHER): Payer: Self-pay | Admitting: Orthopedic Surgery

## 2017-06-26 ENCOUNTER — Ambulatory Visit: Payer: Medicaid Other | Attending: Nurse Practitioner | Admitting: Nurse Practitioner

## 2017-06-26 VITALS — Ht 76.0 in | Wt 286.0 lb

## 2017-06-26 VITALS — BP 137/84 | HR 62 | Temp 98.7°F | Ht 76.0 in

## 2017-06-26 DIAGNOSIS — E119 Type 2 diabetes mellitus without complications: Secondary | ICD-10-CM | POA: Diagnosis not present

## 2017-06-26 DIAGNOSIS — Z79899 Other long term (current) drug therapy: Secondary | ICD-10-CM | POA: Diagnosis not present

## 2017-06-26 DIAGNOSIS — G4730 Sleep apnea, unspecified: Secondary | ICD-10-CM | POA: Diagnosis not present

## 2017-06-26 DIAGNOSIS — Z89511 Acquired absence of right leg below knee: Secondary | ICD-10-CM

## 2017-06-26 DIAGNOSIS — Z794 Long term (current) use of insulin: Secondary | ICD-10-CM | POA: Diagnosis not present

## 2017-06-26 DIAGNOSIS — I10 Essential (primary) hypertension: Secondary | ICD-10-CM | POA: Diagnosis not present

## 2017-06-26 DIAGNOSIS — E785 Hyperlipidemia, unspecified: Secondary | ICD-10-CM | POA: Diagnosis not present

## 2017-06-26 DIAGNOSIS — Z79891 Long term (current) use of opiate analgesic: Secondary | ICD-10-CM | POA: Insufficient documentation

## 2017-06-26 DIAGNOSIS — L84 Corns and callosities: Secondary | ICD-10-CM | POA: Insufficient documentation

## 2017-06-26 LAB — POCT GLYCOSYLATED HEMOGLOBIN (HGB A1C): Hemoglobin A1C: 6.4 % — AB (ref 4.0–5.6)

## 2017-06-26 LAB — GLUCOSE, POCT (MANUAL RESULT ENTRY): POC Glucose: 89 mg/dl (ref 70–99)

## 2017-06-26 NOTE — Progress Notes (Signed)
Assessment & Plan:  Trevor [name] was seen today for follow-up.  Diagnoses and all orders for this visit:  Foot callus -     Glucose (CBG) -     HgB A1c -     Ambulatory referral to Podiatry    Patient has been counseled on age-appropriate routine health concerns for screening and prevention. These are reviewed and up-to-date. Referrals have been placed accordingly. Immunizations are up-to-date or declined.    Subjective:   Chief Complaint  Patient presents with  . Follow-up    Pt. is here to follow-up on diabetes and would like a referral for podiatry.    HPI Trevor [name] 62 y.o. male presents to office today referral to podiatry.  He tells me he has a PCP in [place] he has seen in the past and will be continuing to follow-up with.  He states he has no idea why he is here today as he already has a PCP whom he is seen in 2 days however he would like a referral to a podiatrist for foot calluses and routine foot care due to his diabetes. As of today he will no longer be under my care. He states he has always had a PCP established in [place] and the only reason he came here initially is because he was sent here from the hospital last November 2018. He has 2+ pitting edema of the left foot and lower leg. He was taken off HCTZ 25mg  by me in November 2018 due to worsening renal function and hypokalemia. He was instructed to take potassium chloride which I sent to the pharmacy however he did not pick up the potassium and he was also instructed to follow up with me in 2 weeks for lab recheck as well as BP however he did not follow back up until March 2019 when he was seen for right foot ulcer. He is currently taking chlorthalidone 12.5mg  but may likely need lasix or increase in chlorthalidone. He was instructed to have his PCP follow up on Wednesday. Denies chest pain, shortness of breath, palpitations, lightheadedness, dizziness, headaches or visual disturbances. He has a history of DM. A1c down  from 12 to 6 today.   Review of Systems  Constitutional: Negative for fever, malaise/fatigue and weight loss.  HENT: Negative.  Negative for nosebleeds.   Eyes: Negative.  Negative for blurred vision, double vision and photophobia.  Respiratory: Negative.  Negative for cough and shortness of breath.   Cardiovascular: Negative.  Negative for chest pain, palpitations and leg swelling.  Gastrointestinal: Negative.  Negative for heartburn, nausea and vomiting.  Musculoskeletal: Negative.  Negative for myalgias.  Neurological: Negative.  Negative for dizziness, focal weakness, seizures and headaches.  Psychiatric/Behavioral: Negative.  Negative for suicidal ideas.    Past Medical History:  Diagnosis Date  . Diabetes mellitus without complication ([place])   . Heart murmur   . Hyperlipidemia   . Hypertension   . Sleep apnea     Past Surgical History:  Procedure Laterality Date  . AMPUTATION Right 03/31/2017   Procedure: RIGHT FOOT 1ST AND 2ND RAY AMPUTATION;  Surgeon: Newt Minion, MD;  Location: [facility];  Service: Orthopedics;  Laterality: Right;  . AMPUTATION Right 04/01/2017   Procedure: AMPUTATION BELOW KNEE;  Surgeon: Newt Minion, MD;  Location: [facility];  Service: Orthopedics;  Laterality: Right;  . CORNEAL TRANSPLANT      History reviewed. No pertinent family history.  Social History Reviewed with no changes to be made  today.   Outpatient Medications Prior to Visit  Medication Sig Dispense Refill  . acetaminophen (TYLENOL) 325 MG tablet Take 2 tablets (650 mg total) by mouth every 6 (six) hours as needed for mild pain (or Fever >/= 101).    [name] atenolol (TENORMIN) 50 MG tablet Take 1 tablet (50 mg total) by mouth 2 (two) times daily. 60 tablet 0  . chlorthalidone (HYGROTON) 25 MG tablet Take 0.5 tablets (12.5 mg total) by mouth daily. 30 tablet 0  . insulin aspart (NOVOLOG) 100 UNIT/ML FlexPen Inject 10 Units into the skin 3 (three) times daily with meals. 15 mL 11  . insulin  detemir (LEVEMIR) 100 unit/ml SOLN Inject 0.34 mLs (34 Units total) into the skin at bedtime. 34 mL 1  . losartan (COZAAR) 25 MG tablet Take 4 tablets (100 mg total) by mouth daily. 180 tablet 0  . NIFEdipine (PROCARDIA XL/ADALAT-CC) 90 MG 24 hr tablet Take 1 tablet (90 mg total) by mouth daily. 30 tablet 0  . potassium chloride (KLOR-CON M15) 15 MEQ tablet Take 2 tablets (30 mEq total) by mouth 3 (three) times daily. 120 tablet 0  . traMADol (ULTRAM) 50 MG tablet Take 0.5-1 tablets (25-50 mg total) by mouth every 8 (eight) hours as needed for severe pain. 30 tablet 0  . magnesium oxide (MAG-OX) 400 (241.3 Mg) MG tablet Take 0.5 tablets (200 mg total) by mouth daily. 30 tablet 0  . pantoprazole (PROTONIX) 40 MG tablet Take 1 tablet (40 mg total) by mouth 2 (two) times daily. 60 tablet 0  . polyethylene glycol (MIRALAX / GLYCOLAX) packet Take 17 g by mouth daily as needed for mild constipation. 14 each 0  . senna-docusate (SENOKOT-S) 8.6-50 MG tablet Take 1 tablet by mouth at bedtime. 30 tablet 0   No facility-administered medications prior to visit.     Allergies  Allergen Reactions  . Enalapril Cough       Objective:    BP 137/84 (BP Location: Left Arm, Patient Position: Sitting, Cuff Size: Large)   Pulse 62   Temp 98.7 F (37.1 C) (Oral)   Ht 6\' 4"  (1.93 m)   SpO2 97%   BMI 34.89 kg/m  Wt Readings from Last 3 Encounters:  06/26/17 286 lb (129.7 kg)  04/12/17 286 lb 9.6 oz (130 kg)  04/08/17 (!) 304 lb 14.3 oz (138.3 kg)    Physical Exam  Constitutional: He is oriented to person, place, and time. He appears well-developed and well-nourished. He is cooperative.  HENT:  Head: Normocephalic and atraumatic.  Eyes: EOM are normal.  Neck: Normal range of motion.  Cardiovascular: Normal rate, regular rhythm and normal heart sounds. Exam reveals no gallop and no friction rub.  No murmur heard. Pulmonary/Chest: Effort normal and breath sounds normal. No tachypnea. No respiratory  distress. He has no decreased breath sounds. He has no wheezes. He has no rhonchi. He has no rales. He exhibits no tenderness.  Abdominal: Bowel sounds are normal.  Musculoskeletal: Normal range of motion. He exhibits edema (2+pitting edema).       Right knee: He exhibits deformity (R BKA).       Left foot: There is swelling. There is normal range of motion, no tenderness, no bony tenderness, no crepitus, no deformity and no laceration.  Feet:  Left Foot:  Skin Integrity: Positive for callus (poor foot hygiene with deformed toenails and onychomycosis) and dry skin. Negative for ulcer, skin breakdown or erythema.  Neurological: He is alert and oriented to person,  place, and time. Coordination normal.  Skin: Skin is warm and dry.  Psychiatric: He has a normal mood and affect. His behavior is normal. Judgment and thought content normal.  Nursing note and vitals reviewed.     Patient has been counseled extensively about nutrition and exercise as well as the importance of adherence with medications and regular follow-up. The patient was given clear instructions to go to ER or return to medical center if symptoms don't improve, worsen or new problems develop. The patient verbalized understanding.   Follow-up: please schedule with [facility] podiatry.   [name], FNP-BC [facility] and [place], [place]   06/26/2017, 1:10 PM

## 2017-06-26 NOTE — Patient Instructions (Signed)

## 2017-06-26 NOTE — Progress Notes (Signed)
Office Visit Note   Patient: Trevor [name]           Date of Birth: February 06, 1955           MRN: [id number] Visit Date: 06/26/2017              Requested by: [name], NP [address] [place], [place] [address] PCP: [name], NP  Chief Complaint  Patient presents with  . Right Leg - Routine Post Op    04/01/17 right BKA      HPI: Patient is a 62 year old gentleman who presents in follow-up status post a right transtibial amputation.  Patient is about 3 months out from surgery he has no complaints.  Assessment & Plan: Visit Diagnoses:  1. Acquired absence of right leg below knee (HCC)     Plan: Patient's residual limb is well consolidated he will follow with biotech is a few weeks to be fit with a prosthesis.  Follow-Up Instructions: Return in about 1 month (around 07/24/2017).   Ortho Exam  Patient is alert, oriented, no adenopathy, well-dressed, normal affect, normal respiratory effort. Examination the residual limb is well consolidated there is no redness no cellulitis no open ulcers no signs of infection.  Imaging: No results found. No images are attached to the encounter.  Labs: Lab Results  Component Value Date   HGBA1C 6.4 (A) 06/26/2017   HGBA1C 12.5 03/29/2017   HGBA1C 10.2 11/22/2016   REPTSTATUS 04/14/2017 FINAL 04/09/2017   GRAMSTAIN  03/31/2017    ABUNDANT WBC PRESENT, PREDOMINANTLY PMN ABUNDANT GRAM POSITIVE COCCI MODERATE GRAM NEGATIVE RODS Performed at [facility], [place] [address] [place], [place] [address]    CULT  04/09/2017    NO GROWTH 5 DAYS Performed at [facility] [address], [place], [place] [address]    LABORGA STREPTOCOCCUS ANGINOSIS 03/31/2017     Lab Results  Component Value Date   ALBUMIN 2.3 (L) 04/13/2017   ALBUMIN 2.4 (L) 04/11/2017   ALBUMIN 2.1 (L) 04/05/2017    Body mass index is 34.81 kg/m.  Orders:  No orders of the defined types were placed in this encounter.  No orders  of the defined types were placed in this encounter.    Procedures: No procedures performed  Clinical Data: No additional findings.  ROS:  All other systems negative, except as noted in the HPI. Review of Systems  Objective: Vital Signs: Ht 6\' 4"  (1.93 m)   Wt 286 lb (129.7 kg)   BMI 34.81 kg/m   Specialty Comments:  No specialty comments available.  PMFS History: Patient Active Problem List   Diagnosis Date Noted  . Poorly controlled type 2 diabetes mellitus with peripheral neuropathy ([place])   . Flatulence   . Hypomagnesemia   . Unilateral complete BKA, right, sequela ([place])   . Benign essential HTN   . Hypoalbuminemia due to protein-calorie malnutrition ([place])   . S/P below knee amputation, right ([place]) 04/12/2017  . Acute blood loss anemia   . Other encephalopathy 04/08/2017  . Encephalopathy 04/08/2017  . Altered mental status 04/08/2017  . Labile blood pressure   . Labile blood glucose   . Drug induced constipation   . Stage 3 chronic kidney disease ([place])   . Bacteremia   . S/P unilateral BKA (below knee amputation), right ([place]) 04/04/2017  . PAD (peripheral artery disease) ([place])   . Type 2 diabetes mellitus with right diabetic foot ulcer ([place])   . Post-operative  pain   . Legally blind   . Upper GI bleed   . Streptococcal bacteremia 04/01/2017  . Hypokalemia 03/30/2017  . Uncontrolled type 2 diabetes mellitus with hyperglycemia, with long-term current use of insulin ([place]) 03/30/2017  . Type 2 diabetes mellitus with peripheral neuropathy ([place]) 03/30/2017  . AKI (acute kidney injury) ([place]) 03/30/2017  . CKD stage 3 due to type 2 diabetes mellitus ([place]) 03/30/2017  . Sepsis ([place]) 03/30/2017  . Heart murmur 11/22/2016  . Hyperlipidemia associated with type 2 diabetes mellitus ([place]) 11/22/2016  . Obstructive sleep apnea syndrome 11/22/2016  . Essential hypertension 12/17/2012   Past Medical History:  Diagnosis Date  . Diabetes mellitus without complication  ([place])   . Heart murmur   . Hyperlipidemia   . Hypertension   . Sleep apnea     History reviewed. No pertinent family history.  Past Surgical History:  Procedure Laterality Date  . AMPUTATION Right 03/31/2017   Procedure: RIGHT FOOT 1ST AND 2ND RAY AMPUTATION;  Surgeon: Newt Minion, MD;  Location: [place] [facility];  Service: Orthopedics;  Laterality: Right;  . AMPUTATION Right 04/01/2017   Procedure: AMPUTATION BELOW KNEE;  Surgeon: Newt Minion, MD;  Location: [facility];  Service: Orthopedics;  Laterality: Right;  . CORNEAL TRANSPLANT     Social History   Occupational History  . Not on file  Tobacco Use  . Smoking status: Never Smoker  . Smokeless tobacco: Never Used  Substance and Sexual Activity  . Alcohol use: No  . Drug use: No  . Sexual activity: Not on file

## 2017-07-06 ENCOUNTER — Ambulatory Visit: Payer: Medicaid Other | Admitting: Podiatry

## 2017-07-10 ENCOUNTER — Encounter: Payer: Self-pay | Admitting: Podiatry

## 2017-07-10 ENCOUNTER — Ambulatory Visit: Payer: Medicaid Other | Admitting: Podiatry

## 2017-07-10 DIAGNOSIS — B351 Tinea unguium: Secondary | ICD-10-CM

## 2017-07-10 DIAGNOSIS — M79674 Pain in right toe(s): Secondary | ICD-10-CM | POA: Diagnosis not present

## 2017-07-10 DIAGNOSIS — E1142 Type 2 diabetes mellitus with diabetic polyneuropathy: Secondary | ICD-10-CM

## 2017-07-10 DIAGNOSIS — Q828 Other specified congenital malformations of skin: Secondary | ICD-10-CM | POA: Diagnosis not present

## 2017-07-10 DIAGNOSIS — M79675 Pain in left toe(s): Secondary | ICD-10-CM | POA: Diagnosis not present

## 2017-07-10 NOTE — Progress Notes (Signed)
This patient presents the office with chief  complaint of long thick painful nails. Patient states the nails have not been worked on for over a year.  He says that the nails are painful walking and wearing his shoes.  He is unable to self treat.  This patient is diabetic with neuropathy.  He has a history of an amputation performed below-knee amputation right leg.  He says he had a callus which he worked on and cut himself.  He says that the right foot then became infected and ultimately had a below-knee amputation performed by Dr Sharol Given.  He presents the office today stating that he has a painful callus on the bottom of his left forefoot.  He presents the office today for preventative foot care services on his nails and callus.  General Appearance  Alert, conversant and in no acute stress.  Vascular  Dorsalis pedis and posterior tibial  pulses are not palpable  Left foot..  Capillary return is within normal limits  left. Temperature is within normal limits  left  Neurologic  Senn-Weinstein monofilament wire test within diminished left foot.. Muscle power within normal limits left foot..  Nails Thick disfigured discolored nails with subungual debris  from hallux to fifth toes left foot.. No evidence of bacterial infection or drainage bilaterally.  Orthopedic  No limitations of motion of motion left foot. [name]  No crepitus or effusions noted.  No bony pathology or digital deformities noted.  Skin  normotropic skin with porokeratosis left forefoot..  No signs of infections or ulcers noted.    Onychomycosis   Porokeratosis left foot.  IE  Debride porokeratosis  Debride onychomycosis  X 5.  RTC 3 months   Gardiner Barefoot DPM

## 2017-07-12 ENCOUNTER — Encounter: Payer: Medicaid Other | Admitting: Physical Medicine & Rehabilitation

## 2017-07-26 ENCOUNTER — Ambulatory Visit (INDEPENDENT_AMBULATORY_CARE_PROVIDER_SITE_OTHER): Payer: Medicaid Other | Admitting: Family

## 2017-08-03 ENCOUNTER — Encounter (INDEPENDENT_AMBULATORY_CARE_PROVIDER_SITE_OTHER): Payer: Self-pay | Admitting: Orthopedic Surgery

## 2017-08-03 ENCOUNTER — Ambulatory Visit (INDEPENDENT_AMBULATORY_CARE_PROVIDER_SITE_OTHER): Payer: Medicaid Other | Admitting: Orthopedic Surgery

## 2017-08-03 DIAGNOSIS — Z89511 Acquired absence of right leg below knee: Secondary | ICD-10-CM | POA: Diagnosis not present

## 2017-08-03 NOTE — Progress Notes (Signed)
Office Visit Note   Patient: Trevor [name]           Date of Birth: Mar 03, 1955           MRN: [id number] Visit Date: 08/03/2017              Requested by: [name], NP [address] [place], [place] [address] PCP: [name], NP  Chief Complaint  Patient presents with  . 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  Assessment & Plan: Visit Diagnoses:  1. Acquired absence of right leg below knee [facility])     Plan: Patient states he would like to follow-up in [facility] for his gait training.  This will be set up with his rehab physician.  Patient is to continue with the stump shrinker continue with knee extension exercises.  Follow-Up Instructions: Return in about 2 months (around 10/04/2017).   Ortho Exam  Patient is alert, oriented, no adenopathy, well-dressed, normal affect, normal respiratory effort. Examination patient ambulates in a wheelchair.  His residual limb is consolidating nicely he has minimal swelling there is no ulcerations no drainage he has full extension of the knee.  There are no dystrophic changes no signs of infection.  Imaging: No results found. No images are attached to the encounter.  Labs: Lab Results  Component Value Date   HGBA1C 6.4 (A) 06/26/2017   HGBA1C 12.5 03/29/2017   HGBA1C 10.2 11/22/2016   REPTSTATUS 04/14/2017 FINAL 04/09/2017   GRAMSTAIN  03/31/2017    ABUNDANT WBC PRESENT, PREDOMINANTLY PMN ABUNDANT GRAM POSITIVE COCCI MODERATE GRAM NEGATIVE RODS Performed at [facility], [place] [address], [place], [place] [address]    CULT  04/09/2017    NO GROWTH 5 DAYS Performed at [facility] [address] [place], [place] [address]    LABORGA STREPTOCOCCUS ANGINOSIS 03/31/2017     Lab  Results  Component Value Date   ALBUMIN 2.3 (L) 04/13/2017   ALBUMIN 2.4 (L) 04/11/2017   ALBUMIN 2.1 (L) 04/05/2017    There is no height or weight on file to calculate BMI.  Orders:  No orders of the defined types were placed in this encounter.  No orders of the defined types were placed in this encounter.    Procedures: No procedures performed  Clinical Data: No additional findings.  ROS:  All other systems negative, except as noted in the HPI. Review of Systems  Objective: Vital Signs: There were no vitals taken for this visit.  Specialty Comments:  No specialty comments available.  PMFS History: Patient Active Problem List   Diagnosis Date Noted  . Poorly controlled type 2 diabetes mellitus with peripheral neuropathy ([place])   . Flatulence   . Hypomagnesemia   . Unilateral complete BKA, right, sequela ([place])   . Benign essential HTN   . Hypoalbuminemia due to protein-calorie malnutrition ([place])   . S/P below knee amputation, right ([place]) 04/12/2017  . Acute blood loss anemia   . Other encephalopathy 04/08/2017  . Encephalopathy 04/08/2017  . Altered mental status 04/08/2017  . Labile blood pressure   . Labile blood glucose   . Drug induced constipation   . Stage 3 chronic kidney disease ([place])   . Bacteremia   .  S/P unilateral BKA (below knee amputation), right ([place]) 04/04/2017  . PAD (peripheral artery disease) ([place])   . Type 2 diabetes mellitus with right diabetic foot ulcer ([place])   . Post-operative pain   . Legally blind   . Upper GI bleed   . Streptococcal bacteremia 04/01/2017  . Hypokalemia 03/30/2017  . Uncontrolled type 2 diabetes mellitus with hyperglycemia, with long-term current use of insulin ([place]) 03/30/2017  . Type 2 diabetes mellitus with peripheral neuropathy ([place]) 03/30/2017  . AKI (acute kidney injury) ([place]) 03/30/2017  . CKD stage 3 due to type 2 diabetes mellitus ([place]) 03/30/2017  . Sepsis ([place]) 03/30/2017  . Heart murmur 11/22/2016    . Hyperlipidemia associated with type 2 diabetes mellitus ([place]) 11/22/2016  . Obstructive sleep apnea syndrome 11/22/2016  . Essential hypertension 12/17/2012   Past Medical History:  Diagnosis Date  . Diabetes mellitus without complication ([place])   . Heart murmur   . Hyperlipidemia   . Hypertension   . Sleep apnea     History reviewed. No pertinent family history.  Past Surgical History:  Procedure Laterality Date  . AMPUTATION Right 03/31/2017   Procedure: RIGHT FOOT 1ST AND 2ND RAY AMPUTATION;  Surgeon: Newt Minion, MD;  Location: [facility];  Service: Orthopedics;  Laterality: Right;  . AMPUTATION Right 04/01/2017   Procedure: AMPUTATION BELOW KNEE;  Surgeon: Newt Minion, MD;  Location: [place];  Service: Orthopedics;  Laterality: Right;  . CORNEAL TRANSPLANT     Social History   Occupational History  . Not on file  Tobacco Use  . Smoking status: Never Smoker  . Smokeless tobacco: Never Used  Substance and Sexual Activity  . Alcohol use: No  . Drug use: No  . Sexual activity: Not on file

## 2017-08-10 ENCOUNTER — Other Ambulatory Visit: Payer: Self-pay

## 2017-08-10 ENCOUNTER — Encounter: Payer: Medicaid Other | Attending: Physical Medicine & Rehabilitation | Admitting: Physical Medicine & Rehabilitation

## 2017-08-10 ENCOUNTER — Encounter: Payer: Self-pay | Admitting: Physical Medicine & Rehabilitation

## 2017-08-10 VITALS — BP 157/89 | HR 59

## 2017-08-10 DIAGNOSIS — I10 Essential (primary) hypertension: Secondary | ICD-10-CM | POA: Diagnosis not present

## 2017-08-10 DIAGNOSIS — E1142 Type 2 diabetes mellitus with diabetic polyneuropathy: Secondary | ICD-10-CM | POA: Insufficient documentation

## 2017-08-10 DIAGNOSIS — H548 Legal blindness, as defined in USA: Secondary | ICD-10-CM | POA: Insufficient documentation

## 2017-08-10 DIAGNOSIS — K921 Melena: Secondary | ICD-10-CM | POA: Insufficient documentation

## 2017-08-10 DIAGNOSIS — G4730 Sleep apnea, unspecified: Secondary | ICD-10-CM | POA: Insufficient documentation

## 2017-08-10 DIAGNOSIS — D649 Anemia, unspecified: Secondary | ICD-10-CM | POA: Diagnosis not present

## 2017-08-10 DIAGNOSIS — E1122 Type 2 diabetes mellitus with diabetic chronic kidney disease: Secondary | ICD-10-CM | POA: Diagnosis not present

## 2017-08-10 DIAGNOSIS — Z4781 Encounter for orthopedic aftercare following surgical amputation: Secondary | ICD-10-CM | POA: Insufficient documentation

## 2017-08-10 DIAGNOSIS — I129 Hypertensive chronic kidney disease with stage 1 through stage 4 chronic kidney disease, or unspecified chronic kidney disease: Secondary | ICD-10-CM | POA: Diagnosis not present

## 2017-08-10 DIAGNOSIS — Z89511 Acquired absence of right leg below knee: Secondary | ICD-10-CM | POA: Insufficient documentation

## 2017-08-10 DIAGNOSIS — S88111S Complete traumatic amputation at level between knee and ankle, right lower leg, sequela: Secondary | ICD-10-CM

## 2017-08-10 DIAGNOSIS — E785 Hyperlipidemia, unspecified: Secondary | ICD-10-CM | POA: Insufficient documentation

## 2017-08-10 DIAGNOSIS — E876 Hypokalemia: Secondary | ICD-10-CM | POA: Diagnosis not present

## 2017-08-10 DIAGNOSIS — R269 Unspecified abnormalities of gait and mobility: Secondary | ICD-10-CM | POA: Diagnosis not present

## 2017-08-10 DIAGNOSIS — N189 Chronic kidney disease, unspecified: Secondary | ICD-10-CM | POA: Insufficient documentation

## 2017-08-10 NOTE — Progress Notes (Signed)
Subjective:    Patient ID: Trevor [name], male    DOB: 28-Apr-1955, 62 y.o.   MRN: [id number]  HPI 62 year old right-handed male with history of diabetes mellitus, hypertension, legally blind, and CKD presents for hospital follow up for right BKA.    Last clinic visit 05/11/17.  Since that time, he saw Ortho, notes reviewed. Pt states that he needs gait training and wants it at [facility].  He is following up with Ortho. Pain is controlled. BP is elevated, but states normally controlled. Denies falls. He did obtain potassium level which was WNL after last visit.    Pain Inventory Average Pain 1 Pain Right Now 1 My pain is dull  In the last 24 hours, has pain interfered with the following? General activity 0 Relation with others 0 Enjoyment of life 0 What TIME of day is your pain at its worst? evening Sleep (in general) Fair  Pain is worse with: none Pain improves with: rest and medication Relief from Meds: 10  Mobility ability to climb steps?  no do you drive?  no use a wheelchair transfers alone  Function not employed: date last employed . I need assistance with the following:  bathing, meal prep, household duties and shopping  Neuro/Psych numbness tingling  Physicians involved in your care hospital f/u   No family history on file. Social History   Socioeconomic History  . Marital status: Single    Spouse name: Not on file  . Number of children: Not on file  . Years of education: Not on file  . Highest education level: Not on file  Occupational History  . Not on file  Social Needs  . Financial resource strain: Not on file  . Food insecurity:    Worry: Not on file    Inability: Not on file  . Transportation needs:    Medical: Not on file    Non-medical: Not on file  Tobacco Use  . Smoking status: Never Smoker  . Smokeless tobacco: Never Used  Substance and Sexual Activity  . Alcohol use: No  . Drug use: No  . Sexual activity: Not on file  Lifestyle   . Physical activity:    Days per week: Not on file    Minutes per session: Not on file  . Stress: Not on file  Relationships  . Social connections:    Talks on phone: Not on file    Gets together: Not on file    Attends religious service: Not on file    Active member of club or organization: Not on file    Attends meetings of clubs or organizations: Not on file    Relationship status: Not on file  Other Topics Concern  . Not on file  Social History Narrative  . Not on file   Past Surgical History:  Procedure Laterality Date  . AMPUTATION Right 03/31/2017   Procedure: RIGHT FOOT 1ST AND 2ND RAY AMPUTATION;  Surgeon: Newt Minion, MD;  Location: [facility];  Service: Orthopedics;  Laterality: Right;  . AMPUTATION Right 04/01/2017   Procedure: AMPUTATION BELOW KNEE;  Surgeon: Newt Minion, MD;  Location: [facility];  Service: Orthopedics;  Laterality: Right;  . CORNEAL TRANSPLANT     Past Medical History:  Diagnosis Date  . Diabetes mellitus without complication ([place])   . Heart murmur   . Hyperlipidemia   . Hypertension   . Sleep apnea    BP (!) 157/89   Pulse (!) 59   SpO2  96%   Opioid Risk Score:   Fall Risk Score:  `1  Depression screen PHQ 2/9  Depression screen [facility] 2/9 08/10/2017 06/26/2017 05/11/2017 03/29/2017 11/22/2016  Decreased Interest 0 0 0 2 0  Down, Depressed, Hopeless 0 0 0 0 0  PHQ - 2 Score 0 0 0 2 0  Altered sleeping - 1 1 0 1  Tired, decreased energy - 1 0 3 1  Change in appetite - - 0 1 0  Feeling bad or failure about yourself  - 0 0 0 0  Trouble concentrating - 0 0 0 0  Moving slowly or fidgety/restless - 0 0 1 1  Suicidal thoughts - 0 0 0 0  PHQ-9 Score - 2 1 7 3     Review of Systems  Constitutional: Positive for unexpected weight change.  Eyes: Negative.   Respiratory: Negative.   Cardiovascular: Negative.   Gastrointestinal: Negative.   Endocrine: Negative.        High blood sugar  Genitourinary: Negative.   Skin: Negative.     Allergic/Immunologic: Negative.   Neurological: Positive for numbness.       Tingling   All other systems reviewed and are negative.      Objective:   Physical Exam Constitutional: No distress . Vital signs reviewed. HEENT: EOMI, oral membranes moist Cardiovascular: RRR. No JVD    Respiratory: CTA bilaterally. Normal  effort    GI: BS +, non-distended   Musculoskeletal: Stump less tender Neurological: He is alert and oriented.  Followed simple commands. Motor: B/l UE, LLE 5/5 proximal to distal LLE: 5/5 proximal to distal  Right HF: 5/5  Skin: Stump healed Psychiatric: Alert and appropriate    Assessment & Plan:  62 year old right-handed male with history of diabetes mellitus, hypertension, legally blind, and CKD presents for hospital follow up for right BKA.    1.  Right transtibial amputation   Will order PT for gait training  Cont to follow up with Ortho  2. Pain Management  Controlled at present  3.  Hypertension  Elevated, states controlled at home  Cont meds  4. Gait abnormality  Therapies ordered  Cont wheelchair for safety for time being

## 2017-08-23 ENCOUNTER — Other Ambulatory Visit: Payer: Self-pay

## 2017-08-23 ENCOUNTER — Ambulatory Visit: Payer: Medicaid Other | Attending: Physical Medicine & Rehabilitation

## 2017-08-23 DIAGNOSIS — M6281 Muscle weakness (generalized): Secondary | ICD-10-CM | POA: Insufficient documentation

## 2017-08-23 DIAGNOSIS — R2681 Unsteadiness on feet: Secondary | ICD-10-CM | POA: Insufficient documentation

## 2017-08-23 DIAGNOSIS — R2689 Other abnormalities of gait and mobility: Secondary | ICD-10-CM | POA: Diagnosis not present

## 2017-08-23 NOTE — Patient Instructions (Signed)
Access Code: [email or web address]  URL: [email or web address]  Date: 08/23/2017  Prepared by: Janna Arch   Exercises  Side to Side Weight Shift with Counter Support - 10 reps - 1 sets - 5 hold - 1x daily - 7x weekly  Seated March - 10 reps - 1 sets - 5 hold - 1x daily - 7x weekly  Seated Long Arc Quad - 10 reps - 1 sets - 5 hold - 1x daily - 7x weekly  Prone Lying with Towel Roll (Hip Flexor Stretch) (BKA) - 1 reps - 2 sets - 30 hold - 1x daily - 7x weekly

## 2017-08-23 NOTE — Therapy (Signed)
[place] MAIN [facility] SERVICES [address] [place], [place], [address] Phone: [phone]   Fax:  [phone]  Physical Therapy Evaluation  Patient Details  Name: Trevor [name] MRN: [id number] Date of Birth: 08/22/1955 Referring Provider: Jamse Arn, DR   Encounter Date: 08/23/2017  PT End of Session - 08/23/17 1458    Visit Number  1    Number of Visits  8    Date for PT Re-Evaluation  09/20/17    Authorization Type  1/3 retest goals at 3rd visit , PN 1/10 start at 8/14    PT Start Time  1059    PT Stop Time  1158    PT Time Calculation (min)  59 min    Equipment Utilized During Treatment  Gait belt   R prosthesis   Activity Tolerance  Patient tolerated treatment well;Other (comment)    Behavior During Therapy  WFL for tasks assessed/performed       Past Medical History:  Diagnosis Date  . Diabetes mellitus without complication ([place])   . Heart murmur   . Hyperlipidemia   . Hypertension   . Sleep apnea     Past Surgical History:  Procedure Laterality Date  . AMPUTATION Right 03/31/2017   Procedure: RIGHT FOOT 1ST AND 2ND RAY AMPUTATION;  Surgeon: Newt Minion, MD;  Location: [place];  Service: Orthopedics;  Laterality: Right;  . AMPUTATION Right 04/01/2017   Procedure: AMPUTATION BELOW KNEE;  Surgeon: Newt Minion, MD;  Location: [place];  Service: Orthopedics;  Laterality: Right;  . CORNEAL TRANSPLANT      There were no vitals filed for this visit.     Subjective Assessment - 08/23/17 1114    Subjective  Patient is a pleasant 62 year old male who presents s/p R BKA    Pertinent History  62 year old right-handed male with history of diabetes mellitus, hypertension, legally blind, and CKD presenting for BKA 04/01/17. Patient wears contacts and can see better now.  Patient had two surgeries, one was a couple toes then they decided to go below the knee. 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 Has ambulated with RW in hospital with PT. Patient has had home health therapy and was in inpatient rehab about a month. Patient reports he was mainly sitting down, with occasional walking, last home health therapy in May. Patient reports some standing up but no exercises. Patient lives alone in the house and is scared they might fall. Patient received prosthetic leg Tuesday of last week.    Limitations  Standing;Walking;House hold activities;Other (comment)    How long can you stand comfortably?  10-15 minutes    How long can you walk comfortably?  10-15 minutes    Patient Stated Goals  going up steps and driving car.     Currently in Pain?  No/denies      PAIN: No pain currently Worst pain: 3-4/10   POSTURE: Seated: trunk flexion weight shift L Standing> excessive UE support , weight shift over LLE, trunk flexion  PROM/AROM:  R : 108 flexion , -5 extension   STRENGTH:  Graded on a 0-5 scale Muscle Group Left Right  Shoulder flex    Shoulder Abd    Shoulder Ext    Shoulder IR/ER    Elbow    Wrist/hand    Hip Flex 4+/5 4-/5  Hip Abd 4+/5 4-/5  Hip Add 4/5 3+/5  Hip Ext 4-/5 3-/5  Hip IR/ER    Knee Flex 4+/5 4-/5  Knee Ext 4+/5 4-/5  Ankle DF 4/5   Ankle PF 4/5    SENSATION: WFL  SPECIAL TESTS: EDEMA: circumferential measurement:  L inferior patella pole: 40.5 cm R: inferior patella pole 46.5cm   FUNCTIONAL MOBILITY:  Sit to stand with LLE and one hand on walker, other hand on WC. Require Mod A for walker stability.  BALANCE: Requires BUE support for standing balance with heavy reliance upon UE's for dynamic stability.  Dynamic Sitting Balance  Normal Able to sit unsupported and weight shift across midline maximally x  Good Able to sit unsupported and weight shift across midline moderately   Good-/Fair+ Able to sit unsupported and weight shift across midline minimally   Fair Minimal weight shifting ipsilateral/front,  difficulty crossing midline   Fair- Reach to ipsilateral side and unable to weight shift   Poor + Able to sit unsupported with min A and reach to ipsilateral side, unable to weight shift   Poor Able to sit unsupported with mod A and reach ipsilateral/front-can't cross midline     Standing Dynamic Balance  Normal Stand independently unsupported, able to weight shift and cross midline maximally   Good Stand independently unsupported, able to weight shift and cross midline moderately   Good-/Fair+ Stand independently unsupported, able to weight shift across midline minimally   Fair Stand independently unsupported, weight shift, and reach ipsilaterally, loss of balance when crossing midline   Poor+ Able to stand with Min A and reach ipsilaterally, unable to weight shift   Poor Able to stand with Mod A and minimally reach ipsilaterally, unable to cross midline. x    Static Sitting Balance  Normal Able to maintain balance against maximal resistance   Good Able to maintain balance against moderate resistance x  Good-/Fair+ Accepts minimal resistance   Fair Able to sit unsupported without balance loss and without UE support   Poor+ Able to maintain with Minimal assistance from individual or chair   Poor Unable to maintain balance-requires mod/max support from individual or chair     Static Standing Balance  Normal Able to maintain standing balance against maximal resistance   Good Able to maintain standing balance against moderate resistance   Good-/Fair+ Able to maintain standing balance against minimal resistance   Fair Able to stand unsupported without UE support and without LOB for 1-2 min   Fair- Requires Min A and UE support to maintain standing without loss of balance x  Poor+ Requires mod A and UE support to maintain standing without loss of balance   Poor Requires max A and UE support to maintain standing balance without loss       Education; Donning doffing R prosthesis:  demonstrates then teach back method  GAIT:  in // bars :  Patient requires bilateral UE support when ambulating in // bars with R prosthesis. Able to perform step through movement however demonstrates significantly less weight shift onto RLE with higher reliance upon UE's in stance phase of RLE.    OUTCOME MEASURES: TEST Outcome Interpretation  5 times sit<>stand 61 sec require walker to be held; BUE support >25 yo, >15 sec indicates increased risk for falls  LEFS 29/80                     ,      [facility] PT Assessment - 08/23/17 0001  Assessment   Medical Diagnosis  R BKA    Referring Provider  Jamse Arn, DR    Onset Date/Surgical Date  04/01/17    Hand Dominance  Left    Prior Therapy  Yes      Precautions   Precautions  Fall    Required Braces or Orthoses  --   R prosthesis      Restrictions   Weight Bearing Restrictions  No      Balance Screen   Has the patient fallen in the past 6 months  Yes    How many times?  2    Has the patient had a decrease in activity level because of a fear of falling?   Yes    Is the patient reluctant to leave their home because of a fear of falling?   Yes      [place] residence    Living Arrangements  Alone    Type of [place]  One level    [place] - standard;Wheelchair - manual;Tub bench;Bedside commode      Prior Function   Level of Independence  Independent with basic ADLs      Cognition   Overall Cognitive Status  Within Functional Limits for tasks assessed       Treat:    Access Code: [email or web address]  URL: [email or web address]  Date: 08/23/2017  Prepared by: Janna Arch   Exercises  Side to Side Weight Shift with Counter Support - 10 reps - 1 sets - 5 hold - 1x daily - 7x weekly  Seated March - 10 reps - 1 sets - 5 hold - 1x daily - 7x weekly  Seated Long Arc Quad - 10 reps - 1 sets - 5 hold  - 1x daily - 7x weekly  Prone Lying with Towel Roll (Hip Flexor Stretch) (BKA) - 1 reps - 2 sets - 30 hold - 1x daily - 7x weekly          Objective measurements completed on examination: See above findings.              PT Education - 08/23/17 1457    Education Details  donning/doffing prosthesis, HEP, weight shift. wearing of prosthesis     Person(s) Educated  Patient    Methods  Explanation;Demonstration;Verbal cues    Comprehension  Verbalized understanding;Returned demonstration       PT Short Term Goals - 08/23/17 1751      PT SHORT TERM GOAL #1   Title  Patient will be independent in home exercise program to improve strength/mobility for better functional independence with ADLs.    Baseline  HEP given     Time  2    Period  Weeks    Status  New    Target Date  09/06/17      PT SHORT TERM GOAL #2   Title  Patient will don/doff prosthesis independently to allow for increased mobility in home.     Baseline  8/14: requires PT to guide/direct through process    Time  2    Period  Weeks    Status  New    Target Date  09/06/17      PT SHORT TERM GOAL #3   Title  Patient will ambulate 10 meters with least assistive device and prosthesis to improve mobility in home.  Baseline  8/14: not walking outside of // bars yet     Time  2    Period  Weeks    Status  New    Target Date  09/06/17        PT Long Term Goals - 08/23/17 1753      PT LONG TERM GOAL #1   Title  Patient will ambulate 60 ft with least assistive AD and prosthesis to improve mobility around home and increase independence.     Baseline  8/14: requires use of // bars    Time  4    Period  Weeks    Status  New    Target Date  09/20/17      PT LONG TERM GOAL #2   Title  Patient (> 41 years old) will complete five times sit to stand test in < 15 seconds indicating an increased LE strength and improved balance.    Baseline  8/14: 61 seconds     Time  4    Period  Weeks    Status  New     Target Date  09/20/17      PT LONG TERM GOAL #3   Title  Patient will reduce timed up and go to <11 seconds to reduce fall risk and demonstrate improved transfer/gait ability.    Baseline  unable to perform     Time  4    Period  Weeks    Status  New    Target Date  09/20/17      PT LONG TERM GOAL #4   Title  Patient will increase lower extremity functional scale to >60/80 to demonstrate improved functional mobility and increased tolerance with ADLs.     Baseline  814: 29/80    Time  4    Period  Weeks    Status  New    Target Date  09/20/17      PT LONG TERM GOAL #5   Title  Patient will increase BLE gross strength to 4+/5 as to improve functional strength for independent gait, increased standing tolerance and increased ADL ability.    Baseline  8/14: 4-/15    Time  4    Period  Weeks    Status  New    Target Date  09/20/17             Plan - 08/23/17 1459    Clinical Impression Statement  Patient is a pleasant 62 year old male who presents with R BKA 04/01/17. Patient was educated about donning/doffing prosthesis with patient demonstrating understanding as well as discussed wear patterns during the week. Patient has not worn prosthesis or ambulated without prosthesis since May due to fear of falling. Patient educated on weight shift in standing to increase tolerance of body weight for carryover to ambulation. Ambulating in // bars indicated excessive UE assistance when weightbearing on new prosthetic limb. Poor tolerance/capacity for standing, ambulating,and transfers noted. Patient will benefit from skilled physical therapy to increase functional mobility, improve prosthetic use, and increase independent ambulation for return to PLOF.     History and Personal Factors relevant to plan of care:  This patient presents with  3, personal factors/ comorbidities and 3 ,  body elements including body structures and functions, activity limitations and or participation restrictions.  Patient's condition is, evolving    Clinical Presentation  Evolving    Clinical Presentation due to:  new BKA with progressively shrinking residual limb, new prosthesis  Clinical Decision Making  Moderate    Rehab Potential  Fair    Clinical Impairments Affecting Rehab Potential  (+) previous independence, motivation to return to walking (-) lives alone, hx of diabetes, limited vision     PT Frequency  2x / week    PT Duration  4 weeks    PT Treatment/Interventions  ADLs/Self Care Home Management;Cryotherapy;Electrical Stimulation;Ultrasound;Traction;Moist Heat;DME Instruction;Gait training;Stair training;Functional mobility training;Therapeutic activities;Therapeutic exercise;Patient/family education;Neuromuscular re-education;Balance training;Prosthetic Training;Wheelchair mobility training;Manual techniques;Manual lymph drainage;Compression bandaging;Taping;Energy conservation;Passive range of motion    PT Next Visit Plan  Standing in // bars with prosthesis, standing marches, abduction, extension    PT Home Exercise Plan  see sheet    Recommended Other Services  n/a     Consulted and Agree with Plan of Care  Patient       Patient will benefit from skilled therapeutic intervention in order to improve the following deficits and impairments:  Abnormal gait, Decreased activity tolerance, Decreased balance, Decreased knowledge of precautions, Decreased endurance, Decreased knowledge of use of DME, Decreased mobility, Decreased range of motion, Difficulty walking, Decreased strength, Increased edema, Impaired flexibility, Impaired perceived functional ability, Prosthetic Dependency, Postural dysfunction, Improper body mechanics, Pain  Visit Diagnosis: Other abnormalities of gait and mobility  Muscle weakness (generalized)  Unsteadiness on feet     Problem List Patient Active Problem List   Diagnosis Date Noted  . Poorly controlled type 2 diabetes mellitus with peripheral neuropathy  ([place])   . Flatulence   . Hypomagnesemia   . Unilateral complete BKA, right, sequela ([place])   . Benign essential HTN   . Hypoalbuminemia due to protein-calorie malnutrition ([place])   . S/P below knee amputation, right ([place]) 04/12/2017  . Acute blood loss anemia   . Other encephalopathy 04/08/2017  . Encephalopathy 04/08/2017  . Altered mental status 04/08/2017  . Labile blood pressure   . Labile blood glucose   . Drug induced constipation   . Stage 3 chronic kidney disease ([place])   . Bacteremia   . S/P unilateral BKA (below knee amputation), right ([place]) 04/04/2017  . PAD (peripheral artery disease) ([place])   . Type 2 diabetes mellitus with right diabetic foot ulcer ([place])   . Post-operative pain   . Legally blind   . Upper GI bleed   . Streptococcal bacteremia 04/01/2017  . Hypokalemia 03/30/2017  . Uncontrolled type 2 diabetes mellitus with hyperglycemia, with long-term current use of insulin ([place]) 03/30/2017  . Type 2 diabetes mellitus with peripheral neuropathy ([place]) 03/30/2017  . AKI (acute kidney injury) ([place]) 03/30/2017  . CKD stage 3 due to type 2 diabetes mellitus ([place]) 03/30/2017  . Sepsis ([place]) 03/30/2017  . Heart murmur 11/22/2016  . Hyperlipidemia associated with type 2 diabetes mellitus ([place]) 11/22/2016  . Obstructive sleep apnea syndrome 11/22/2016  . Essential hypertension 12/17/2012   Janna Arch, PT, DPT   08/23/2017, 6:02 PM  [place] MAIN [facility] SERVICES [address] [place], [place], [address] Phone: [phone]   Fax:  [phone]  Name: Trevor [name] MRN: [id number] Date of Birth: [birth year]

## 2017-08-30 ENCOUNTER — Ambulatory Visit: Payer: Medicaid Other

## 2017-08-30 DIAGNOSIS — R2689 Other abnormalities of gait and mobility: Secondary | ICD-10-CM

## 2017-08-30 DIAGNOSIS — R2681 Unsteadiness on feet: Secondary | ICD-10-CM

## 2017-08-30 DIAGNOSIS — M6281 Muscle weakness (generalized): Secondary | ICD-10-CM

## 2017-08-30 NOTE — Therapy (Signed)
[place] MAIN [facility] SERVICES [address] [place], [place], [address] Phone: [phone]   Fax:  [phone]  Physical Therapy Treatment  Patient Details  Name: Trevor [name] MRN: [id number] Date of Birth: 1955/08/05 Referring Provider: Jamse Arn, DR   Encounter Date: 08/30/2017  PT End of Session - 08/30/17 1107    Visit Number  2    Number of Visits  8    Date for PT Re-Evaluation  09/20/17    Authorization Type  1/3 retest goals at 3rd visit , PN 2/10 start at 8/14    PT Start Time  1102    PT Stop Time  1145    PT Time Calculation (min)  43 min    Equipment Utilized During Treatment  Gait belt   R prosthesis   Activity Tolerance  Patient tolerated treatment well;Other (comment)    Behavior During Therapy  WFL for tasks assessed/performed       Past Medical History:  Diagnosis Date  . Diabetes mellitus without complication ([place])   . Heart murmur   . Hyperlipidemia   . Hypertension   . Sleep apnea     Past Surgical History:  Procedure Laterality Date  . AMPUTATION Right 03/31/2017   Procedure: RIGHT FOOT 1ST AND 2ND RAY AMPUTATION;  Surgeon: Newt Minion, MD;  Location: [place];  Service: Orthopedics;  Laterality: Right;  . AMPUTATION Right 04/01/2017   Procedure: AMPUTATION BELOW KNEE;  Surgeon: Newt Minion, MD;  Location: [place];  Service: Orthopedics;  Laterality: Right;  . CORNEAL TRANSPLANT      There were no vitals filed for this visit.  Subjective Assessment - 08/30/17 1106    Subjective  Patient reports he has not been compliant with HEP however has been wearing his prosthesis everyday for an hour.     Pertinent History  62 year old right-handed male with history of diabetes mellitus, hypertension, legally blind, and CKD presenting for BKA 04/01/17. Patient wears contacts and can see better now.  Patient had two surgeries, one was a couple toes then they decided to go below the knee. He is working with  bi[employer] has a tan stump shrinker he has a limb protector he is to be fit with a prosthesis. Has ambulated with RW in hospital with PT. Patient has had home health therapy and was in inpatient rehab about a month. Patient reports he was mainly sitting down, with occasional walking, last home health therapy in May. Patient reports some standing up but no exercises. Patient lives alone in the house and is scared they might fall. Patient received prosthetic leg Tuesday of last week.    Limitations  Standing;Walking;House hold activities;Other (comment)    How long can you stand comfortably?  10-15 minutes    How long can you walk comfortably?  10-15 minutes    Patient Stated Goals  going up steps and driving car.     Currently in Pain?  No/denies       // bars:  Weight shift with prosthetic on: left and right x15; BUE support  Marches in // bars 15x each leg; BUE support Ambulate in // bars 4x length of bars BUE support  Weight shift onto RLE with green pad under LLE. 2x30 seconds   Seated hamstring stretch RLE 60 seconds  Side stepping in // bars 4x length  Step over half foam roller 10x each leg BUE support   Standing hip extensions 10x each leg; needs to  bend right knee for movement.   Ambulate with  40 ft RW and CGA with WC follow :decreased weight shift onto RLE. Patient c/o of tightness in posterior leg with prosthesis.                           PT Education - 08/30/17 1106    Education Details  need for compliance with HEP. weight shift, exercise technique     Person(s) Educated  Patient    Methods  Explanation;Demonstration;Tactile cues;Verbal cues    Comprehension  Verbalized understanding;Returned demonstration       PT Short Term Goals - 08/23/17 1751      PT SHORT TERM GOAL #1   Title  Patient will be independent in home exercise program to improve strength/mobility for better functional independence with ADLs.    Baseline  HEP given     Time  2     Period  Weeks    Status  New    Target Date  09/06/17      PT SHORT TERM GOAL #2   Title  Patient will don/doff prosthesis independently to allow for increased mobility in home.     Baseline  8/14: requires PT to guide/direct through process    Time  2    Period  Weeks    Status  New    Target Date  09/06/17      PT SHORT TERM GOAL #3   Title  Patient will ambulate 10 meters with least assistive device and prosthesis to improve mobility in home.     Baseline  8/14: not walking outside of // bars yet     Time  2    Period  Weeks    Status  New    Target Date  09/06/17        PT Long Term Goals - 08/23/17 1753      PT LONG TERM GOAL #1   Title  Patient will ambulate 60 ft with least assistive AD and prosthesis to improve mobility around home and increase independence.     Baseline  8/14: requires use of // bars    Time  4    Period  Weeks    Status  New    Target Date  09/20/17      PT LONG TERM GOAL #2   Title  Patient (> 57 years old) will complete five times sit to stand test in < 15 seconds indicating an increased LE strength and improved balance.    Baseline  8/14: 61 seconds     Time  4    Period  Weeks    Status  New    Target Date  09/20/17      PT LONG TERM GOAL #3   Title  Patient will reduce timed up and go to <11 seconds to reduce fall risk and demonstrate improved transfer/gait ability.    Baseline  unable to perform     Time  4    Period  Weeks    Status  New    Target Date  09/20/17      PT LONG TERM GOAL #4   Title  Patient will increase lower extremity functional scale to >60/80 to demonstrate improved functional mobility and increased tolerance with ADLs.     Baseline  814: 29/80    Time  4    Period  Weeks    Status  New  Target Date  09/20/17      PT LONG TERM GOAL #5   Title  Patient will increase BLE gross strength to 4+/5 as to improve functional strength for independent gait, increased standing tolerance and increased ADL ability.     Baseline  8/14: 4-/15    Time  4    Period  Weeks    Status  New    Target Date  09/20/17            Plan - 08/30/17 1151    Clinical Impression Statement  Patient ambulated for the first time with RW and CGA with w/c follow. Good reciprocal pattern. Patient c/o of pain in posterior aspect of leg when weightbearing, but is going to prosthetist Tuesday. Patient will benefit from skilled physical therapy to increase functional mobility, improve prosthetic use, and increase independent ambulation for return to PLOF.     Rehab Potential  Fair    Clinical Impairments Affecting Rehab Potential  (+) previous independence, motivation to return to walking (-) lives alone, hx of diabetes, limited vision     PT Frequency  2x / week    PT Duration  4 weeks    PT Treatment/Interventions  ADLs/Self Care Home Management;Cryotherapy;Electrical Stimulation;Ultrasound;Traction;Moist Heat;DME Instruction;Gait training;Stair training;Functional mobility training;Therapeutic activities;Therapeutic exercise;Patient/family education;Neuromuscular re-education;Balance training;Prosthetic Training;Wheelchair mobility training;Manual techniques;Manual lymph drainage;Compression bandaging;Taping;Energy conservation;Passive range of motion    PT Next Visit Plan  Standing in // bars with prosthesis, standing marches, abduction, extension    PT Home Exercise Plan  see sheet    Consulted and Agree with Plan of Care  Patient       Patient will benefit from skilled therapeutic intervention in order to improve the following deficits and impairments:  Abnormal gait, Decreased activity tolerance, Decreased balance, Decreased knowledge of precautions, Decreased endurance, Decreased knowledge of use of DME, Decreased mobility, Decreased range of motion, Difficulty walking, Decreased strength, Increased edema, Impaired flexibility, Impaired perceived functional ability, Prosthetic Dependency, Postural dysfunction, Improper  body mechanics, Pain  Visit Diagnosis: Other abnormalities of gait and mobility  Muscle weakness (generalized)  Unsteadiness on feet     Problem List Patient Active Problem List   Diagnosis Date Noted  . Poorly controlled type 2 diabetes mellitus with peripheral neuropathy ([place])   . Flatulence   . Hypomagnesemia   . Unilateral complete BKA, right, sequela ([place])   . Benign essential HTN   . Hypoalbuminemia due to protein-calorie malnutrition ([place])   . S/P below knee amputation, right ([place]) 04/12/2017  . Acute blood loss anemia   . Other encephalopathy 04/08/2017  . Encephalopathy 04/08/2017  . Altered mental status 04/08/2017  . Labile blood pressure   . Labile blood glucose   . Drug induced constipation   . Stage 3 chronic kidney disease ([place])   . Bacteremia   . S/P unilateral BKA (below knee amputation), right ([place]) 04/04/2017  . PAD (peripheral artery disease) ([place])   . Type 2 diabetes mellitus with right diabetic foot ulcer ([place])   . Post-operative pain   . Legally blind   . Upper GI bleed   . Streptococcal bacteremia 04/01/2017  . Hypokalemia 03/30/2017  . Uncontrolled type 2 diabetes mellitus with hyperglycemia, with long-term current use of insulin ([place]) 03/30/2017  . Type 2 diabetes mellitus with peripheral neuropathy ([place]) 03/30/2017  . AKI (acute kidney injury) ([place]) 03/30/2017  . CKD stage 3 due to type 2 diabetes mellitus ([place]) 03/30/2017  . Sepsis ([place]) 03/30/2017  . Heart murmur 11/22/2016  .  Hyperlipidemia associated with type 2 diabetes mellitus ([place]) 11/22/2016  . Obstructive sleep apnea syndrome 11/22/2016  . Essential hypertension 12/17/2012   Janna Arch, PT, DPT   08/30/2017, 11:53 AM  [place] MAIN [facility] SERVICES [address] [place], [place], [address] Phone: [phone]   Fax:  [phone]  Name: Trevor [name] MRN: [id number] Date of Birth: 1955/03/15

## 2017-08-31 ENCOUNTER — Other Ambulatory Visit: Payer: Self-pay

## 2017-08-31 DIAGNOSIS — Z1211 Encounter for screening for malignant neoplasm of colon: Secondary | ICD-10-CM

## 2017-09-06 ENCOUNTER — Ambulatory Visit: Payer: Medicaid Other

## 2017-09-06 DIAGNOSIS — R2681 Unsteadiness on feet: Secondary | ICD-10-CM

## 2017-09-06 DIAGNOSIS — R2689 Other abnormalities of gait and mobility: Secondary | ICD-10-CM

## 2017-09-06 DIAGNOSIS — M6281 Muscle weakness (generalized): Secondary | ICD-10-CM

## 2017-09-06 NOTE — Therapy (Signed)
[facility] MAIN REHAB SERVICES [address] [place], [place], [address] Phone: [phone]   Fax:  [phone]  Physical Therapy Treatment  Patient Details  Name: Trevor F Draheim MRN: [id number] Date of Birth: 01/02/1956 Referring Provider: Ankit, Anil Patel, DR   Encounter Date: 09/06/2017  PT End of Session - 09/06/17 1107    Visit Number  3    Number of Visits  8    Date for PT Re-Evaluation  09/20/17    Authorization Type  3/3 retest goals at 3rd visit , PN 3/10 start at 8/14    PT Start Time  1101    PT Stop Time  1145    PT Time Calculation (min)  44 min    Equipment Utilized During Treatment  Gait belt   R prosthesis   Activity Tolerance  Patient tolerated treatment well;Other (comment)    Behavior During Therapy  WFL for tasks assessed/performed       Past Medical History:  Diagnosis Date  . Diabetes mellitus without complication (HCC)   . Heart murmur   . Hyperlipidemia   . Hypertension   . Sleep apnea     Past Surgical History:  Procedure Laterality Date  . AMPUTATION Right 03/31/2017   Procedure: RIGHT FOOT 1ST AND 2ND RAY AMPUTATION;  Surgeon: Duda, Marcus V, MD;  Location: [facility];  Service: Orthopedics;  Laterality: Right;  . AMPUTATION Right 04/01/2017   Procedure: AMPUTATION BELOW KNEE;  Surgeon: Duda, Marcus V, MD;  Location: [facility];  Service: Orthopedics;  Laterality: Right;  . CORNEAL TRANSPLANT      There were no vitals filed for this visit.   Vitals: 157/70 pulse 55  Subjective Assessment - 09/06/17 1104    Subjective  Patient went and saw prosthetist this week and had leg re-fitted. Has had some occasional pain in residual limb and feeling of tightness     Pertinent History  62-year-old right-handed male with history of diabetes mellitus, hypertension, legally blind, and CKD presenting for BKA 04/01/17. Patient wears contacts and can see better now.  Patient had two surgeries, one was a couple toes then they  decided to go below the knee. 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 Has ambulated with RW in hospital with PT. Patient has had home health therapy and was in inpatient rehab about a month. Patient reports he was mainly sitting down, with occasional walking, last home health therapy in May. Patient reports some standing up but no exercises. Patient lives alone in the house and is scared they might fall. Patient received prosthetic leg Tuesday of last week.    Limitations  Standing;Walking;House hold activities;Other (comment)    How long can you stand comfortably?  10-15 minutes    How long can you walk comfortably?  10-15 minutes    Patient Stated Goals  going up steps and driving car.     Currently in Pain?  No/denies      HEP Don/doff prosthesis: independent 10 MWT: 1 min 3 seconds:  60 ft walk : 30 ft 5x STS: 42 seconds no use of RLE TUG: 1min 30 seconds  X 2 attempts  LEFS: 20/80   Seated adduction ball squeezes 10x 5 second holds   Education on ambulating around cone: Min A for placement of walker and positioning of self in walker.                             PT Education - 09/06/17 1106    Education Details  goals, ambulation, weight shift.     Person(s) Educated  Patient    Methods  Explanation;Demonstration;Verbal cues    Comprehension  Verbalized understanding;Returned demonstration       PT Short Term Goals - 09/06/17 1108      PT SHORT TERM GOAL #1   Title  Patient will be independent in home exercise program to improve strength/mobility for better functional independence with ADLs.    Baseline  HEP given     Time  2    Period  Weeks    Status  Partially Met      PT SHORT TERM GOAL #2   Title  Patient will don/doff prosthesis independently to allow for increased mobility in home.     Baseline  8/14: requires PT to guide/direct through process 8/28: independent    Time  2     Period  Weeks    Status  Achieved      PT SHORT TERM GOAL #3   Title  Patient will ambulate 10 meters with least assistive device and prosthesis to improve mobility in home.     Baseline  8/14: not walking outside of // bars yet  8/28: 63 seconds with RW and CGA     Time  2    Period  Weeks    Status  Achieved        PT Long Term Goals - 09/06/17 1130      PT LONG TERM GOAL #1   Title  Patient will ambulate 60 ft with least assistive AD and prosthesis to improve mobility around home and increase independence.     Baseline  8/14: requires use of // bars 8/28: 30 ft with RW and CGA    Time  4    Period  Weeks    Status  Partially Met      PT LONG TERM GOAL #2   Title  Patient (> 85 years old) will complete five times sit to stand test in < 15 seconds indicating an increased LE strength and improved balance.    Baseline  8/14: 61 seconds 8/28; 42 seconds    Time  4    Period  Weeks    Status  Partially Met    Target Date  09/20/17      PT LONG TERM GOAL #3   Title  Patient will reduce timed up and go to <11 seconds to reduce fall risk and demonstrate improved transfer/gait ability.    Baseline  8/14: unable to perform 8/28: 1 min 30 seconds    Time  4    Period  Weeks    Status  Partially Met      PT LONG TERM GOAL #4   Title  Patient will increase lower extremity functional scale to >60/80 to demonstrate improved functional mobility and increased tolerance with ADLs.     Baseline  814: 29/80 8/28: 20/80    Time  4    Period  Weeks    Status  Partially Met      PT LONG TERM GOAL #5   Title  Patient will increase BLE gross strength to 4+/5 as to improve functional strength for independent gait, increased standing tolerance and increased ADL ability.    Baseline  8/14: 4-/15 8/28: 4-/5     Time  4    Period  Weeks    Status  New    Target Date  09/20/17      Additional Long Term Goals   Additional Long Term Goals  Yes      PT LONG TERM GOAL #6   Title  Patient  will perform 10 MWT in >.5 m/s for improved mobility and safety with negotiating natural environment    Baseline  8/28: .16 m/s with RW     Time  4    Period  Weeks    Status  New            Plan - 09/06/17 1205    Clinical Impression Statement  Patient is demonstrating improved mobility with ability to ambulate with RW. TUG performed in 1 min 30 seconds 10 MWT performed in 61 seconds with RW , CGA and WC follow, 5x STS improved to 42 seconds.with excessive UE support. Patient challenged by turning when ambulating requiring max verbal cues and occasional Min A for placement of walker. Limited weight shift onto prosthesis with ambulation noted with fatigue. Patient will benefit from skilled physical therapy to increase functional mobility, improve prosthetic use, and increase independent ambulation for return to PLOF.     Rehab Potential  Fair    Clinical Impairments Affecting Rehab Potential  (+) previous independence, motivation to return to walking (-) lives alone, hx of diabetes, limited vision     PT Frequency  2x / week    PT Duration  4 weeks    PT Treatment/Interventions  ADLs/Self Care Home Management;Cryotherapy;Electrical Stimulation;Ultrasound;Traction;Moist Heat;DME Instruction;Gait training;Stair training;Functional mobility training;Therapeutic activities;Therapeutic exercise;Patient/family education;Neuromuscular re-education;Balance training;Prosthetic Training;Wheelchair mobility training;Manual techniques;Manual lymph drainage;Compression bandaging;Taping;Energy conservation;Passive range of motion    PT Next Visit Plan  Standing in // bars with prosthesis, standing marches, abduction, extension    PT Home Exercise Plan  see sheet    Consulted and Agree with Plan of Care  Patient       Patient will benefit from skilled therapeutic intervention in order to improve the following deficits and impairments:  Abnormal gait, Decreased activity tolerance, Decreased balance,  Decreased knowledge of precautions, Decreased endurance, Decreased knowledge of use of DME, Decreased mobility, Decreased range of motion, Difficulty walking, Decreased strength, Increased edema, Impaired flexibility, Impaired perceived functional ability, Prosthetic Dependency, Postural dysfunction, Improper body mechanics, Pain  Visit Diagnosis: Other abnormalities of gait and mobility  Muscle weakness (generalized)  Unsteadiness on feet     Problem List Patient Active Problem List   Diagnosis Date Noted  . Poorly controlled type 2 diabetes mellitus with peripheral neuropathy (HCC)   . Flatulence   . Hypomagnesemia   . Unilateral complete BKA, right, sequela (HCC)   . Benign essential HTN   . Hypoalbuminemia due to protein-calorie malnutrition (HCC)   . S/P below knee amputation, right (HCC) 04/12/2017  . Acute blood loss anemia   . Other encephalopathy 04/08/2017  . Encephalopathy 04/08/2017  . Altered mental status 04/08/2017  . Labile blood pressure   . Labile blood glucose   . Drug induced constipation   . Stage 3 chronic kidney disease (HCC)   . Bacteremia   . S/P unilateral BKA (below knee amputation), right (HCC) 04/04/2017  . PAD (peripheral artery disease) (HCC)   . Type 2 diabetes mellitus with right diabetic foot ulcer (HCC)   . Post-operative pain   . Legally blind   . Upper GI bleed   . Streptococcal bacteremia 04/01/2017  . Hypokalemia 03/30/2017  . Uncontrolled type 2 diabetes mellitus with hyperglycemia, with long-term current use of insulin (HCC) 03/30/2017  . Type   2 diabetes mellitus with peripheral neuropathy (HCC) 03/30/2017  . AKI (acute kidney injury) (HCC) 03/30/2017  . CKD stage 3 due to type 2 diabetes mellitus (HCC) 03/30/2017  . Sepsis (HCC) 03/30/2017  . Heart murmur 11/22/2016  . Hyperlipidemia associated with type 2 diabetes mellitus (HCC) 11/22/2016  . Obstructive sleep apnea syndrome 11/22/2016  . Essential hypertension 12/17/2012     , PT, DPT   09/06/2017, 12:06 PM  [facility] MAIN REHAB SERVICES [address] [place], [place], [address] Phone: [phone]   Fax:  [phone]  Name: Marquize F Gillott MRN: [id number] Date of Birth: 03/22/1955   

## 2017-09-08 ENCOUNTER — Telehealth: Payer: Self-pay | Admitting: Gastroenterology

## 2017-09-08 NOTE — Telephone Encounter (Signed)
Pt is calling to r/s his procedure to a Tuesday or Thursday after the 18th

## 2017-09-12 ENCOUNTER — Telehealth: Payer: Self-pay

## 2017-09-12 NOTE — Telephone Encounter (Signed)
LVM for pt to notify him that his colonoscopy date can be moved to the 19th.  To call our office to let me know if the date works or does not work.  I've already moved it from the 16 th to the 19th.  Thanks [employer]

## 2017-09-12 NOTE — Telephone Encounter (Signed)
Patient states he has a court date on 9.18.19 and does not want to be on clear liquids that day. Patient requesting a call back to discuss another day.

## 2017-09-12 NOTE — Telephone Encounter (Signed)
Patient stated the 18th would not be a good date.  He will call or stop by the office tomorrow to discuss new date after his physical therapy.  Thanks [employer]

## 2017-09-13 ENCOUNTER — Ambulatory Visit: Payer: Medicaid Other | Attending: Physical Medicine & Rehabilitation

## 2017-09-13 DIAGNOSIS — R2681 Unsteadiness on feet: Secondary | ICD-10-CM

## 2017-09-13 DIAGNOSIS — R2689 Other abnormalities of gait and mobility: Secondary | ICD-10-CM | POA: Diagnosis present

## 2017-09-13 DIAGNOSIS — M6281 Muscle weakness (generalized): Secondary | ICD-10-CM | POA: Insufficient documentation

## 2017-09-13 NOTE — Therapy (Signed)
[place] MAIN [facility] SERVICES [address] [place], [place], [address] Phone: [phone]   Fax:  [phone]  Physical Therapy Treatment  Patient Details  Name: Trevor [name] MRN: [id number] Date of Birth: July 01, 1955 Referring Provider: Jamse Arn, DR   Encounter Date: 09/13/2017  PT End of Session - 09/13/17 1023    Visit Number  4    Number of Visits  8    Date for PT Re-Evaluation  09/20/17    Authorization Type   PN 4/10 start at 8/14    PT Start Time  1016    PT Stop Time  1101    PT Time Calculation (min)  45 min    Equipment Utilized During Treatment  Gait belt   R prosthesis   Activity Tolerance  Patient tolerated treatment well;Other (comment)    Behavior During Therapy  WFL for tasks assessed/performed       Past Medical History:  Diagnosis Date  . Diabetes mellitus without complication ([place])   . Heart murmur   . Hyperlipidemia   . Hypertension   . Sleep apnea     Past Surgical History:  Procedure Laterality Date  . AMPUTATION Right 03/31/2017   Procedure: RIGHT FOOT 1ST AND 2ND RAY AMPUTATION;  Surgeon: Newt Minion, MD;  Location: [place];  Service: Orthopedics;  Laterality: Right;  . AMPUTATION Right 04/01/2017   Procedure: AMPUTATION BELOW KNEE;  Surgeon: Newt Minion, MD;  Location: [place];  Service: Orthopedics;  Laterality: Right;  . CORNEAL TRANSPLANT      There were no vitals filed for this visit.  Subjective Assessment - 09/13/17 1021    Subjective  Patient reports no aches or pains right now. Still has occasional residual limb pain and states prosthesis feels too tight. Has been compliant with HEp and standing program.     Pertinent History  62 year old right-handed male with history of diabetes mellitus, hypertension, legally blind, and CKD presenting for BKA 04/01/17. Patient wears contacts and can see better now.  Patient had two surgeries, one was a couple toes then they decided to go below the  knee. 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 Has ambulated with RW in hospital with PT. Patient has had home health therapy and was in inpatient rehab about a month. Patient reports he was mainly sitting down, with occasional walking, last home health therapy in May. Patient reports some standing up but no exercises. Patient lives alone in the house and is scared they might fall. Patient received prosthetic leg Tuesday of last week.    Limitations  Standing;Walking;House hold activities;Other (comment)    How long can you stand comfortably?  10-15 minutes    How long can you walk comfortably?  10-15 minutes    Patient Stated Goals  going up steps and driving car.     Currently in Pain?  No/denies       Vitals: 140/79 pulse 62  Donning/doffing prosthesis in seated position, three attempts for    Ambulate with RW and WC follow with CGA in hallway; cues for weight shifting over RLE due to preference to mainly shift weight over LLE decreasing stance phase of RLE and swing phase of LLE for shortened steps.  106 ft  Standing marches 10x each leg at at time BUE support  Weight shift with LLE on green pad for weight shift over  RLE 2 minutes BUE support  Bed mobility: Sit <>prone  Prone hip flexor stretch 3 minute hold each LE  Seated adduction ball squeezes 10x 5 second holds                            PT Education - 09/13/17 1023    Education Details  ambulation, exercise technique     Person(s) Educated  Patient    Methods  Explanation;Demonstration;Verbal cues    Comprehension  Verbalized understanding;Returned demonstration       PT Short Term Goals - 09/06/17 1108      PT SHORT TERM GOAL #1   Title  Patient will be independent in home exercise program to improve strength/mobility for better functional independence with ADLs.    Baseline  HEP given     Time  2    Period  Weeks    Status   Partially Met      PT SHORT TERM GOAL #2   Title  Patient will don/doff prosthesis independently to allow for increased mobility in home.     Baseline  8/14: requires PT to guide/direct through process 8/28: independent    Time  2    Period  Weeks    Status  Achieved      PT SHORT TERM GOAL #3   Title  Patient will ambulate 10 meters with least assistive device and prosthesis to improve mobility in home.     Baseline  8/14: not walking outside of // bars yet  8/28: 63 seconds with RW and CGA     Time  2    Period  Weeks    Status  Achieved        PT Long Term Goals - 09/06/17 1130      PT LONG TERM GOAL #1   Title  Patient will ambulate 60 ft with least assistive AD and prosthesis to improve mobility around home and increase independence.     Baseline  8/14: requires use of // bars 8/28: 30 ft with RW and CGA    Time  4    Period  Weeks    Status  Partially Met      PT LONG TERM GOAL #2   Title  Patient (> 44 years old) will complete five times sit to stand test in < 15 seconds indicating an increased LE strength and improved balance.    Baseline  8/14: 61 seconds 8/28; 42 seconds    Time  4    Period  Weeks    Status  Partially Met    Target Date  09/20/17      PT LONG TERM GOAL #3   Title  Patient will reduce timed up and go to <11 seconds to reduce fall risk and demonstrate improved transfer/gait ability.    Baseline  8/14: unable to perform 8/28: 1 min 30 seconds    Time  4    Period  Weeks    Status  Partially Met      PT LONG TERM GOAL #4   Title  Patient will increase lower extremity functional scale to >60/80 to demonstrate improved functional mobility and increased tolerance with ADLs.     Baseline  814: 29/80 8/28: 20/80    Time  4    Period  Weeks    Status  Partially Met      PT LONG TERM GOAL #5   Title  Patient will  increase BLE gross strength to 4+/5 as to improve functional strength for independent gait, increased standing tolerance and increased  ADL ability.    Baseline  8/14: 4-/15 8/28: 4-/5     Time  4    Period  Weeks    Status  New    Target Date  09/20/17      Additional Long Term Goals   Additional Long Term Goals  Yes      PT LONG TERM GOAL #6   Title  Patient will perform 10 MWT in >.5 m/s for improved mobility and safety with negotiating natural environment    Baseline  8/28: .16 m/s with RW     Time  4    Period  Weeks    Status  New            Plan - 09/13/17 1108    Clinical Impression Statement  Patient demonstrates improved capacity for ambulation, ambulating in hallway with RW and CGA with WC follow. Continued difficulty with weight shift and acceptance over RLE with ambulation due to fear of stability of prosthesis. Bilateral hip flexors have limited muscle tissue length that was improved with prolonged holds in prone position. Patient will benefit from skilled physical therapy to increase functional mobility, improve prosthetic use, and increase independent ambulation for return to PLOF.     Rehab Potential  Fair    Clinical Impairments Affecting Rehab Potential  (+) previous independence, motivation to return to walking (-) lives alone, hx of diabetes, limited vision     PT Frequency  2x / week    PT Duration  4 weeks    PT Treatment/Interventions  ADLs/Self Care Home Management;Cryotherapy;Electrical Stimulation;Ultrasound;Traction;Moist Heat;DME Instruction;Gait training;Stair training;Functional mobility training;Therapeutic activities;Therapeutic exercise;Patient/family education;Neuromuscular re-education;Balance training;Prosthetic Training;Wheelchair mobility training;Manual techniques;Manual lymph drainage;Compression bandaging;Taping;Energy conservation;Passive range of motion    PT Next Visit Plan  Standing in // bars with prosthesis, standing marches, abduction, extension    PT Home Exercise Plan  see sheet    Consulted and Agree with Plan of Care  Patient       Patient will benefit from  skilled therapeutic intervention in order to improve the following deficits and impairments:  Abnormal gait, Decreased activity tolerance, Decreased balance, Decreased knowledge of precautions, Decreased endurance, Decreased knowledge of use of DME, Decreased mobility, Decreased range of motion, Difficulty walking, Decreased strength, Increased edema, Impaired flexibility, Impaired perceived functional ability, Prosthetic Dependency, Postural dysfunction, Improper body mechanics, Pain  Visit Diagnosis: Other abnormalities of gait and mobility  Muscle weakness (generalized)  Unsteadiness on feet     Problem List Patient Active Problem List   Diagnosis Date Noted  . Poorly controlled type 2 diabetes mellitus with peripheral neuropathy ([place])   . Flatulence   . Hypomagnesemia   . Unilateral complete BKA, right, sequela ([place])   . Benign essential HTN   . Hypoalbuminemia due to protein-calorie malnutrition ([place])   . S/P below knee amputation, right ([place]) 04/12/2017  . Acute blood loss anemia   . Other encephalopathy 04/08/2017  . Encephalopathy 04/08/2017  . Altered mental status 04/08/2017  . Labile blood pressure   . Labile blood glucose   . Drug induced constipation   . Stage 3 chronic kidney disease ([place])   . Bacteremia   . S/P unilateral BKA (below knee amputation), right ([place]) 04/04/2017  . PAD (peripheral artery disease) ([place])   . Type 2 diabetes mellitus with right diabetic foot ulcer ([place])   . Post-operative pain   .  Legally blind   . Upper GI bleed   . Streptococcal bacteremia 04/01/2017  . Hypokalemia 03/30/2017  . Uncontrolled type 2 diabetes mellitus with hyperglycemia, with long-term current use of insulin ([place]) 03/30/2017  . Type 2 diabetes mellitus with peripheral neuropathy ([place]) 03/30/2017  . AKI (acute kidney injury) ([place]) 03/30/2017  . CKD stage 3 due to type 2 diabetes mellitus ([place]) 03/30/2017  . Sepsis ([place]) 03/30/2017  . Heart murmur 11/22/2016  .  Hyperlipidemia associated with type 2 diabetes mellitus ([place]) 11/22/2016  . Obstructive sleep apnea syndrome 11/22/2016  . Essential hypertension 12/17/2012   Janna Arch, PT, DPT   09/13/2017, 11:10 AM  [place] MAIN [facility] SERVICES [address] [place], [place], [address] Phone: [phone]   Fax:  [phone]  Name: Trevor [name] MRN: [id number] Date of Birth: [birth year]

## 2017-09-14 ENCOUNTER — Telehealth: Payer: Self-pay | Admitting: Gastroenterology

## 2017-09-14 NOTE — Telephone Encounter (Signed)
LVM for pt to call back -if I'm not available to take the call for him to leave detailed dates that he would be available to have his procedure on.  Thanks [employer]

## 2017-09-14 NOTE — Telephone Encounter (Signed)
Pt left vm for Sharyn Lull he would like a call ASAP

## 2017-09-15 ENCOUNTER — Telehealth: Payer: Self-pay

## 2017-09-15 NOTE — Telephone Encounter (Signed)
Patient has rescheduled colonoscopy from 09/19 to 10/02/17.  Referral updated.  Trish in Endoscopy notified.  Thanks [employer]

## 2017-09-20 ENCOUNTER — Ambulatory Visit: Payer: Medicaid Other | Admitting: Physical Therapy

## 2017-09-21 ENCOUNTER — Ambulatory Visit: Payer: Medicaid Other | Admitting: Physical Therapy

## 2017-09-26 ENCOUNTER — Ambulatory Visit: Payer: Medicaid Other

## 2017-09-26 DIAGNOSIS — R2689 Other abnormalities of gait and mobility: Secondary | ICD-10-CM | POA: Diagnosis not present

## 2017-09-26 DIAGNOSIS — M6281 Muscle weakness (generalized): Secondary | ICD-10-CM

## 2017-09-26 DIAGNOSIS — R2681 Unsteadiness on feet: Secondary | ICD-10-CM

## 2017-09-26 NOTE — Therapy (Signed)
[place] MAIN [facility] SERVICES [address] [place], [place], [address] Phone: [phone]   Fax:  [phone]  Physical Therapy Treatment Physical Therapy Progress Note   Dates of reporting period  08/23/17   to  09/26/17  Patient Details  Name: Trevor [name] MRN: [id number] Date of Birth: July 06, 1955 Referring Provider: Jamse Arn, DR   Encounter Date: 09/26/2017  PT End of Session - 09/26/17 1039    Visit Number  5    Number of Visits  13    Date for PT Re-Evaluation  10/24/17    Authorization Type   PN 5/10 (next 1/10 start 9/17)     PT Start Time  1031    PT Stop Time  1115    PT Time Calculation (min)  44 min    Equipment Utilized During Treatment  Gait belt   R prosthesis   Activity Tolerance  Patient tolerated treatment well;Other (comment)    Behavior During Therapy  WFL for tasks assessed/performed       Past Medical History:  Diagnosis Date  . Diabetes mellitus without complication ([place])   . Heart murmur   . Hyperlipidemia   . Hypertension   . Sleep apnea     Past Surgical History:  Procedure Laterality Date  . AMPUTATION Right 03/31/2017   Procedure: RIGHT FOOT 1ST AND 2ND RAY AMPUTATION;  Surgeon: Newt Minion, MD;  Location: [place];  Service: Orthopedics;  Laterality: Right;  . AMPUTATION Right 04/01/2017   Procedure: AMPUTATION BELOW KNEE;  Surgeon: Newt Minion, MD;  Location: [place];  Service: Orthopedics;  Laterality: Right;  . CORNEAL TRANSPLANT      There were no vitals filed for this visit.  Subjective Assessment - 09/26/17 1036    Subjective  Patient reports he went to see the prosthetist last week who adjusted the angle of the prosthesis. Reports no pain or LOB since last session.     Pertinent History  61 year old right-handed male with history of diabetes mellitus, hypertension, legally blind, and CKD presenting for BKA 04/01/17. Patient wears contacts and can see better now.  Patient had two  surgeries, one was a couple toes then they decided to go below the knee. 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 Has ambulated with RW in hospital with PT. Patient has had home health therapy and was in inpatient rehab about a month. Patient reports he was mainly sitting down, with occasional walking, last home health therapy in May. Patient reports some standing up but no exercises. Patient lives alone in the house and is scared they might fall. Patient received prosthetic leg Tuesday of last week.    Limitations  Standing;Walking;House hold activities;Other (comment)    How long can you stand comfortably?  10-15 minutes    How long can you walk comfortably?  10-15 minutes    Patient Stated Goals  going up steps and driving car.     Currently in Pain?  No/denies     Vitals: 128/64 pulse 56     Donning/doffing prosthesis with 3 ply sock   Ambulate 60 ft with least assistive AD: 74 ft + 74 ft with verbal cues for keeping feet apart with RW and CGA.  5x STS: 24 seconds BUE support with LLE use, not utilizing RLE.  TUG: 49 seconds first trial : 38 seconds with RW and cues for  keeping feet within walker LEFS: 14/86 BLE strength : 4+/5 LLE 4/5 RLE  10 MWT  19 seconds= .53 m/s     Patient's condition has the potential to improve in response to therapy. Maximum improvement is yet to be obtained. The anticipated improvement is attainable and reasonable in a generally predictable time.  Patient reports he is feeling more steady on his feet and stronger.                     PT Education - 09/26/17 1037    Education Details  goal progression, exercise technique     Person(s) Educated  Patient    Methods  Explanation;Demonstration;Verbal cues    Comprehension  Verbalized understanding;Returned demonstration       PT Short Term Goals - 09/26/17 1054      PT SHORT TERM GOAL #1   Title  Patient will be  independent in home exercise program to improve strength/mobility for better functional independence with ADLs.    Baseline  HEP given     Time  2    Period  Weeks    Status  Partially Met      PT SHORT TERM GOAL #2   Title  Patient will don/doff prosthesis independently to allow for increased mobility in home.     Baseline  8/14: requires PT to guide/direct through process 8/28: independent    Time  2    Period  Weeks    Status  Achieved      PT SHORT TERM GOAL #3   Title  Patient will ambulate 10 meters with least assistive device and prosthesis to improve mobility in home.     Baseline  8/14: not walking outside of // bars yet  8/28: 63 seconds with RW and CGA     Time  2    Period  Weeks    Status  Achieved        PT Long Term Goals - 09/26/17 1054      PT LONG TERM GOAL #1   Title  Patient will ambulate 60 ft with least assistive AD and prosthesis to improve mobility around home and increase independence.     Baseline  8/14: requires use of // bars 8/28: 30 ft with RW and CGA 9/17: 74 ft x2 with CGA and RW     Time  4    Period  Weeks    Status  Achieved      PT LONG TERM GOAL #2   Title  Patient (> 85 years old) will complete five times sit to stand test in < 15 seconds indicating an increased LE strength and improved balance.    Baseline  8/14: 61 seconds 8/28; 42 seconds 9/17: 24 seconds BUE support with LLE    Time  4    Period  Weeks    Status  Partially Met    Target Date  10/24/17      PT LONG TERM GOAL #3   Title  Patient will reduce timed up and go to <11 seconds to reduce fall risk and demonstrate improved transfer/gait ability.    Baseline  8/14: unable to perform 8/28: 1 min 30 seconds 9/17: 38 seconds RW     Time  4    Period  Weeks    Status  Partially Met    Target Date  10/24/17      PT LONG TERM GOAL #4   Title  Patient will increase lower  extremity functional scale to >60/80 to demonstrate improved functional mobility and increased tolerance  with ADLs.     Baseline  814: 29/80 8/28: 20/80    Time  4    Period  Weeks    Status  Partially Met    Target Date  10/24/17      PT LONG TERM GOAL #5   Title  Patient will increase BLE gross strength to 4+/5 as to improve functional strength for independent gait, increased standing tolerance and increased ADL ability.    Baseline  8/14: 4-/15 8/28: 4-/5  9/17: L 4-/5 R 4/5     Time  4    Period  Weeks    Status  Achieved      PT LONG TERM GOAL #6   Title  Patient will perform 10 MWT in >.5 m/s for improved mobility and safety with negotiating natural environment    Baseline  8/28: .16 m/s with RW  9/17: .53 m/s     Time  4    Period  Weeks    Status  Achieved      PT LONG TERM GOAL #7   Title  Patient will perform 10 MWT in >1.0 m/s for improved mobility and safety with negotiating natural environment    Baseline  9/17: .53 m/s     Time  4    Period  Weeks    Status  New    Target Date  10/24/17      PT LONG TERM GOAL #8   Title  Patient will ambulate 300 ft with least assistive AD and prosthesis to improve mobility around home and increase independence.     Baseline  9/17: 74 ft CGA with RW     Time  4    Period  Weeks    Status  New    Target Date  10/24/17      PT LONG TERM GOAL  #9   TITLE  Patient will ascend/descend 4 stairs with rail assist independently without loss of balance to improve ability to get in/out of home.     Baseline  9/17: unable to negotiate stairs     Time  4    Period  Weeks    Status  New    Target Date  10/24/17            Plan - 09/26/17 1136    Clinical Impression Statement  Patient progressing towards goals with improved velocity of ambulation and transfers. 10 MWT improved to .53 m/s, TUG to 38 seconds, and 5x STS to 24 seconds with BUE support and LLE. Patient is able to ambulate for further duration demonstrating improved capacity of mobility. Patient's condition has the potential to improve in response to therapy. Maximum  improvement is yet to be obtained. The anticipated improvement is attainable and reasonable in a generally predictable time. Patient will benefit from skilled physical therapy to increase functional mobility, improve prosthetic use, and increase independent ambulation for return to PLOF.     Rehab Potential  Fair    Clinical Impairments Affecting Rehab Potential  (+) previous independence, motivation to return to walking (-) lives alone, hx of diabetes, limited vision     PT Frequency  2x / week    PT Duration  4 weeks    PT Treatment/Interventions  ADLs/Self Care Home Management;Cryotherapy;Electrical Stimulation;Ultrasound;Traction;Moist Heat;DME Instruction;Gait training;Stair training;Functional mobility training;Therapeutic activities;Therapeutic exercise;Patient/family education;Neuromuscular re-education;Balance training;Prosthetic Training;Wheelchair mobility training;Manual techniques;Manual lymph drainage;Compression bandaging;Taping;Energy conservation;Passive range of motion  PT Next Visit Plan  steps in // bars     PT Home Exercise Plan  see sheet    Consulted and Agree with Plan of Care  Patient       Patient will benefit from skilled therapeutic intervention in order to improve the following deficits and impairments:  Abnormal gait, Decreased activity tolerance, Decreased balance, Decreased knowledge of precautions, Decreased endurance, Decreased knowledge of use of DME, Decreased mobility, Decreased range of motion, Difficulty walking, Decreased strength, Increased edema, Impaired flexibility, Impaired perceived functional ability, Prosthetic Dependency, Postural dysfunction, Improper body mechanics, Pain  Visit Diagnosis: Other abnormalities of gait and mobility  Muscle weakness (generalized)  Unsteadiness on feet     Problem List Patient Active Problem List   Diagnosis Date Noted  . Poorly controlled type 2 diabetes mellitus with peripheral neuropathy ([place])   .  Flatulence   . Hypomagnesemia   . Unilateral complete BKA, right, sequela ([place])   . Benign essential HTN   . Hypoalbuminemia due to protein-calorie malnutrition ([place])   . S/P below knee amputation, right ([place]) 04/12/2017  . Acute blood loss anemia   . Other encephalopathy 04/08/2017  . Encephalopathy 04/08/2017  . Altered mental status 04/08/2017  . Labile blood pressure   . Labile blood glucose   . Drug induced constipation   . Stage 3 chronic kidney disease ([place])   . Bacteremia   . S/P unilateral BKA (below knee amputation), right ([place]) 04/04/2017  . PAD (peripheral artery disease) ([place])   . Type 2 diabetes mellitus with right diabetic foot ulcer ([place])   . Post-operative pain   . Legally blind   . Upper GI bleed   . Streptococcal bacteremia 04/01/2017  . Hypokalemia 03/30/2017  . Uncontrolled type 2 diabetes mellitus with hyperglycemia, with long-term current use of insulin ([place]) 03/30/2017  . Type 2 diabetes mellitus with peripheral neuropathy ([place]) 03/30/2017  . AKI (acute kidney injury) ([place]) 03/30/2017  . CKD stage 3 due to type 2 diabetes mellitus ([place]) 03/30/2017  . Sepsis ([place]) 03/30/2017  . Heart murmur 11/22/2016  . Hyperlipidemia associated with type 2 diabetes mellitus ([place]) 11/22/2016  . Obstructive sleep apnea syndrome 11/22/2016  . Essential hypertension 12/17/2012     Janna Arch, PT, DPT   09/26/2017, 11:38 AM  [place] MAIN [facility] SERVICES [address] [place], [place], [address] Phone: [phone]   Fax:  [phone]  Name: Trevor [name] MRN: [id number] Date of Birth: 1955/06/26

## 2017-09-28 ENCOUNTER — Encounter: Payer: Medicaid Other | Admitting: Physical Therapy

## 2017-09-28 ENCOUNTER — Ambulatory Visit: Payer: Medicaid Other

## 2017-09-28 DIAGNOSIS — R2681 Unsteadiness on feet: Secondary | ICD-10-CM

## 2017-09-28 DIAGNOSIS — R2689 Other abnormalities of gait and mobility: Secondary | ICD-10-CM | POA: Diagnosis not present

## 2017-09-28 DIAGNOSIS — M6281 Muscle weakness (generalized): Secondary | ICD-10-CM

## 2017-09-28 NOTE — Therapy (Signed)
[place] MAIN [facility] SERVICES [address] [place], [place], [address] Phone: [phone]   Fax:  [phone]  Physical Therapy Treatment  Patient Details  Name: Trevor [name] MRN: [id number] Date of Birth: Mar 06, 1955 Referring Provider: Jamse Arn, DR   Encounter Date: 09/28/2017  PT End of Session - 09/28/17 1025    Visit Number  6    Number of Visits  13    Date for PT Re-Evaluation  10/24/17    Authorization Type   PN  1/10 start 9/17)     PT Start Time  1020    PT Stop Time  1100    PT Time Calculation (min)  40 min    Equipment Utilized During Treatment  Gait belt   R prosthesis   Activity Tolerance  Patient tolerated treatment well;Other (comment)    Behavior During Therapy  WFL for tasks assessed/performed       Past Medical History:  Diagnosis Date  . Diabetes mellitus without complication ([place])   . Heart murmur   . Hyperlipidemia   . Hypertension   . Sleep apnea     Past Surgical History:  Procedure Laterality Date  . AMPUTATION Right 03/31/2017   Procedure: RIGHT FOOT 1ST AND 2ND RAY AMPUTATION;  Surgeon: Newt Minion, MD;  Location: [place];  Service: Orthopedics;  Laterality: Right;  . AMPUTATION Right 04/01/2017   Procedure: AMPUTATION BELOW KNEE;  Surgeon: Newt Minion, MD;  Location: [place];  Service: Orthopedics;  Laterality: Right;  . CORNEAL TRANSPLANT      There were no vitals filed for this visit.   Vitals at start of session: 126/66 pulse 58  Subjective Assessment - 09/28/17 1023    Subjective  Patient reports having pain in lateral portion of residual leg a few minutes ago with no exacerbation.     Pertinent History  62 year old right-handed male with history of diabetes mellitus, hypertension, legally blind, and CKD presenting for BKA 04/01/17. Patient wears contacts and can see better now.  Patient had two surgeries, one was a couple toes then they decided to go below the knee. 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 Has ambulated with RW in hospital with PT. Patient has had home health therapy and was in inpatient rehab about a month. Patient reports he was mainly sitting down, with occasional walking, last home health therapy in May. Patient reports some standing up but no exercises. Patient lives alone in the house and is scared they might fall. Patient received prosthetic leg Tuesday of last week.    Limitations  Standing;Walking;House hold activities;Other (comment)    How long can you stand comfortably?  10-15 minutes    How long can you walk comfortably?  10-15 minutes    Patient Stated Goals  going up steps and driving car.     Currently in Pain?  Yes    Pain Score  3     Pain Location  Leg    Pain Orientation  Right    Pain Descriptors / Indicators  Aching    Pain Type  Acute pain    Pain Onset  Today    Pain Frequency  Intermittent      Donning/doffing prosthesis in seated position, 3 ply sock added    Ambulate with RW and WC follow with CGA in hallway; cues for weight shifting over RLE  due to preference to mainly shift weight over LLE decreasing stance phase of RLE and swing phase of LLE for shortened steps.  150 ft x 2 trials for ~300 ft total ambulation    Bed mobility: Sit <>prone   Prone hip flexor stretch 3 minute hold each LE   Prone; hip extensions 10x each leg  Prone, hamstring curl 10x each leg.         Patient's lateral pain of residual limb monitored throughout session.                     PT Education - 09/28/17 1025    Education Details  exercise technique, ambulation     Person(s) Educated  Patient    Methods  Explanation;Demonstration;Verbal cues    Comprehension  Verbalized understanding;Returned demonstration;Need further instruction       PT Short Term Goals - 09/26/17 1054      PT SHORT TERM GOAL #1   Title  Patient will be independent in  home exercise program to improve strength/mobility for better functional independence with ADLs.    Baseline  HEP given     Time  2    Period  Weeks    Status  Partially Met      PT SHORT TERM GOAL #2   Title  Patient will don/doff prosthesis independently to allow for increased mobility in home.     Baseline  8/14: requires PT to guide/direct through process 8/28: independent    Time  2    Period  Weeks    Status  Achieved      PT SHORT TERM GOAL #3   Title  Patient will ambulate 10 meters with least assistive device and prosthesis to improve mobility in home.     Baseline  8/14: not walking outside of // bars yet  8/28: 63 seconds with RW and CGA     Time  2    Period  Weeks    Status  Achieved        PT Long Term Goals - 09/26/17 1054      PT LONG TERM GOAL #1   Title  Patient will ambulate 60 ft with least assistive AD and prosthesis to improve mobility around home and increase independence.     Baseline  8/14: requires use of // bars 8/28: 30 ft with RW and CGA 9/17: 74 ft x2 with CGA and RW     Time  4    Period  Weeks    Status  Achieved      PT LONG TERM GOAL #2   Title  Patient (> 53 years old) will complete five times sit to stand test in < 15 seconds indicating an increased LE strength and improved balance.    Baseline  8/14: 61 seconds 8/28; 42 seconds 9/17: 24 seconds BUE support with LLE    Time  4    Period  Weeks    Status  Partially Met    Target Date  10/24/17      PT LONG TERM GOAL #3   Title  Patient will reduce timed up and go to <11 seconds to reduce fall risk and demonstrate improved transfer/gait ability.    Baseline  8/14: unable to perform 8/28: 1 min 30 seconds 9/17: 38 seconds RW     Time  4    Period  Weeks    Status  Partially Met    Target Date  10/24/17  PT LONG TERM GOAL #4   Title  Patient will increase lower extremity functional scale to >60/80 to demonstrate improved functional mobility and increased tolerance with ADLs.      Baseline  814: 29/80 8/28: 20/80    Time  4    Period  Weeks    Status  Partially Met    Target Date  10/24/17      PT LONG TERM GOAL #5   Title  Patient will increase BLE gross strength to 4+/5 as to improve functional strength for independent gait, increased standing tolerance and increased ADL ability.    Baseline  8/14: 4-/15 8/28: 4-/5  9/17: L 4-/5 R 4/5     Time  4    Period  Weeks    Status  Achieved      PT LONG TERM GOAL #6   Title  Patient will perform 10 MWT in >.5 m/s for improved mobility and safety with negotiating natural environment    Baseline  8/28: .16 m/s with RW  9/17: .53 m/s     Time  4    Period  Weeks    Status  Achieved      PT LONG TERM GOAL #7   Title  Patient will perform 10 MWT in >1.0 m/s for improved mobility and safety with negotiating natural environment    Baseline  9/17: .53 m/s     Time  4    Period  Weeks    Status  New    Target Date  10/24/17      PT LONG TERM GOAL #8   Title  Patient will ambulate 300 ft with least assistive AD and prosthesis to improve mobility around home and increase independence.     Baseline  9/17: 74 ft CGA with RW     Time  4    Period  Weeks    Status  New    Target Date  10/24/17      PT LONG TERM GOAL  #9   TITLE  Patient will ascend/descend 4 stairs with rail assist independently without loss of balance to improve ability to get in/out of home.     Baseline  9/17: unable to negotiate stairs     Time  4    Period  Weeks    Status  New    Target Date  10/24/17            Plan - 09/28/17 1214    Clinical Impression Statement  Patient session was interrupted by bathroom break limiting session duration. Patient demonstrated increased ambulatory capacity ambulating ~300 ft with RW and CGA with one rest break. Bilateral hip flexors have limited muscle tissue length that was improved with prolonged holds in prone position. Patient will benefit from skilled physical therapy to increase functional  mobility, improve prosthetic use, and increase independent ambulation for return to PLOF    Rehab Potential  Fair    Clinical Impairments Affecting Rehab Potential  (+) previous independence, motivation to return to walking (-) lives alone, hx of diabetes, limited vision     PT Frequency  2x / week    PT Duration  4 weeks    PT Treatment/Interventions  ADLs/Self Care Home Management;Cryotherapy;Electrical Stimulation;Ultrasound;Traction;Moist Heat;DME Instruction;Gait training;Stair training;Functional mobility training;Therapeutic activities;Therapeutic exercise;Patient/family education;Neuromuscular re-education;Balance training;Prosthetic Training;Wheelchair mobility training;Manual techniques;Manual lymph drainage;Compression bandaging;Taping;Energy conservation;Passive range of motion    PT Next Visit Plan  steps in // bars     PT Home Exercise Plan  see sheet    Consulted and Agree with Plan of Care  Patient       Patient will benefit from skilled therapeutic intervention in order to improve the following deficits and impairments:  Abnormal gait, Decreased activity tolerance, Decreased balance, Decreased knowledge of precautions, Decreased endurance, Decreased knowledge of use of DME, Decreased mobility, Decreased range of motion, Difficulty walking, Decreased strength, Increased edema, Impaired flexibility, Impaired perceived functional ability, Prosthetic Dependency, Postural dysfunction, Improper body mechanics, Pain  Visit Diagnosis: Other abnormalities of gait and mobility  Muscle weakness (generalized)  Unsteadiness on feet     Problem List Patient Active Problem List   Diagnosis Date Noted  . Poorly controlled type 2 diabetes mellitus with peripheral neuropathy ([place])   . Flatulence   . Hypomagnesemia   . Unilateral complete BKA, right, sequela ([place])   . Benign essential HTN   . Hypoalbuminemia due to protein-calorie malnutrition ([place])   . S/P below knee amputation, right  ([place]) 04/12/2017  . Acute blood loss anemia   . Other encephalopathy 04/08/2017  . Encephalopathy 04/08/2017  . Altered mental status 04/08/2017  . Labile blood pressure   . Labile blood glucose   . Drug induced constipation   . Stage 3 chronic kidney disease ([place])   . Bacteremia   . S/P unilateral BKA (below knee amputation), right ([place]) 04/04/2017  . PAD (peripheral artery disease) ([place])   . Type 2 diabetes mellitus with right diabetic foot ulcer ([place])   . Post-operative pain   . Legally blind   . Upper GI bleed   . Streptococcal bacteremia 04/01/2017  . Hypokalemia 03/30/2017  . Uncontrolled type 2 diabetes mellitus with hyperglycemia, with long-term current use of insulin ([place]) 03/30/2017  . Type 2 diabetes mellitus with peripheral neuropathy ([place]) 03/30/2017  . AKI (acute kidney injury) ([place]) 03/30/2017  . CKD stage 3 due to type 2 diabetes mellitus ([place]) 03/30/2017  . Sepsis ([place]) 03/30/2017  . Heart murmur 11/22/2016  . Hyperlipidemia associated with type 2 diabetes mellitus ([place]) 11/22/2016  . Obstructive sleep apnea syndrome 11/22/2016  . Essential hypertension 12/17/2012   Janna Arch, PT, DPT   09/28/2017, 12:40 PM  [place] MAIN [facility] SERVICES [address] [place], [place], [address] Phone: [phone]   Fax:  [phone]  Name: Trevor [name] MRN: [id number] Date of Birth: [birth year]

## 2017-10-02 ENCOUNTER — Encounter: Payer: Self-pay | Admitting: Anesthesiology

## 2017-10-02 ENCOUNTER — Encounter
Admission: RE | Disposition: A | Discharge status: Home / Self Care | Payer: Self-pay | Source: Ambulatory Visit | Attending: Gastroenterology

## 2017-10-02 ENCOUNTER — Ambulatory Visit: Payer: Medicaid Other | Admitting: Anesthesiology

## 2017-10-02 ENCOUNTER — Ambulatory Visit
Admission: RE | Admit: 2017-10-02 | Discharge: 2017-10-02 | Disposition: A | Discharge status: Home / Self Care | Payer: Medicaid Other | Source: Ambulatory Visit | Attending: Gastroenterology | Admitting: Gastroenterology

## 2017-10-02 DIAGNOSIS — D123 Benign neoplasm of transverse colon: Secondary | ICD-10-CM | POA: Insufficient documentation

## 2017-10-02 DIAGNOSIS — Z79899 Other long term (current) drug therapy: Secondary | ICD-10-CM | POA: Diagnosis not present

## 2017-10-02 DIAGNOSIS — E119 Type 2 diabetes mellitus without complications: Secondary | ICD-10-CM | POA: Diagnosis not present

## 2017-10-02 DIAGNOSIS — Z794 Long term (current) use of insulin: Secondary | ICD-10-CM | POA: Insufficient documentation

## 2017-10-02 DIAGNOSIS — R011 Cardiac murmur, unspecified: Secondary | ICD-10-CM | POA: Diagnosis not present

## 2017-10-02 DIAGNOSIS — Z89431 Acquired absence of right foot: Secondary | ICD-10-CM | POA: Diagnosis not present

## 2017-10-02 DIAGNOSIS — G4730 Sleep apnea, unspecified: Secondary | ICD-10-CM | POA: Insufficient documentation

## 2017-10-02 DIAGNOSIS — K640 First degree hemorrhoids: Secondary | ICD-10-CM | POA: Insufficient documentation

## 2017-10-02 DIAGNOSIS — I10 Essential (primary) hypertension: Secondary | ICD-10-CM | POA: Diagnosis not present

## 2017-10-02 DIAGNOSIS — N289 Disorder of kidney and ureter, unspecified: Secondary | ICD-10-CM | POA: Diagnosis not present

## 2017-10-02 DIAGNOSIS — K621 Rectal polyp: Secondary | ICD-10-CM | POA: Diagnosis not present

## 2017-10-02 DIAGNOSIS — Z1211 Encounter for screening for malignant neoplasm of colon: Secondary | ICD-10-CM | POA: Diagnosis not present

## 2017-10-02 DIAGNOSIS — E785 Hyperlipidemia, unspecified: Secondary | ICD-10-CM | POA: Diagnosis not present

## 2017-10-02 DIAGNOSIS — G709 Myoneural disorder, unspecified: Secondary | ICD-10-CM | POA: Diagnosis not present

## 2017-10-02 DIAGNOSIS — D122 Benign neoplasm of ascending colon: Secondary | ICD-10-CM | POA: Insufficient documentation

## 2017-10-02 DIAGNOSIS — Z89511 Acquired absence of right leg below knee: Secondary | ICD-10-CM | POA: Insufficient documentation

## 2017-10-02 DIAGNOSIS — Z888 Allergy status to other drugs, medicaments and biological substances status: Secondary | ICD-10-CM | POA: Diagnosis not present

## 2017-10-02 HISTORY — PX: COLONOSCOPY WITH PROPOFOL: SHX5780

## 2017-10-02 LAB — GLUCOSE, CAPILLARY: Glucose-Capillary: 99 mg/dL (ref 70–99)

## 2017-10-02 SURGERY — COLONOSCOPY WITH PROPOFOL
Anesthesia: General

## 2017-10-02 MED ORDER — MIDAZOLAM HCL 2 MG/2ML IJ SOLN
INTRAMUSCULAR | Status: DC | PRN
  Administered 2017-10-02: 2 mg via INTRAVENOUS

## 2017-10-02 MED ORDER — SODIUM CHLORIDE 0.9 % IV SOLN
INTRAVENOUS | Status: DC
  Administered 2017-10-02: 1000 mL via INTRAVENOUS

## 2017-10-02 MED ORDER — MIDAZOLAM HCL 2 MG/2ML IJ SOLN
INTRAMUSCULAR | Status: AC
  Filled 2017-10-02: qty 2

## 2017-10-02 MED ORDER — FENTANYL CITRATE (PF) 100 MCG/2ML IJ SOLN
INTRAMUSCULAR | Status: AC
  Filled 2017-10-02: qty 2

## 2017-10-02 MED ORDER — PROPOFOL 500 MG/50ML IV EMUL
INTRAVENOUS | Status: AC
  Filled 2017-10-02: qty 50

## 2017-10-02 MED ORDER — FENTANYL CITRATE (PF) 100 MCG/2ML IJ SOLN
INTRAMUSCULAR | Status: DC | PRN
  Administered 2017-10-02 (×2): 50 ug via INTRAVENOUS

## 2017-10-02 MED ORDER — PROPOFOL 500 MG/50ML IV EMUL
INTRAVENOUS | Status: DC | PRN
  Administered 2017-10-02: 120 ug/kg/min via INTRAVENOUS

## 2017-10-02 NOTE — Transfer of Care (Signed)
Immediate Anesthesia Transfer of Care Note  Patient: Trevor [name]  Procedure(s) Performed: COLONOSCOPY WITH PROPOFOL (N/A )  Patient Location: PACU  Anesthesia Type:General  Level of Consciousness: awake and sedated  Airway & Oxygen Therapy: Patient Spontanous Breathing and Patient connected to nasal cannula oxygen  Post-op Assessment: Report given to RN and Post -op Vital signs reviewed and stable  Post vital signs: Reviewed and stable  Last Vitals:  Vitals Value Taken Time  BP    Temp    Pulse    Resp    SpO2      Last Pain:  Vitals:   10/02/17 0838  TempSrc: Tympanic  PainSc: 0-No pain         Complications: No apparent anesthesia complications

## 2017-10-02 NOTE — Anesthesia Preprocedure Evaluation (Signed)
Anesthesia Evaluation  Patient identified by MRN, date of birth, ID band Patient awake    Reviewed: Allergy & Precautions, H&P , NPO status , Patient's Chart, lab work & pertinent test results  History of Anesthesia Complications Negative for: history of anesthetic complications  Airway Mallampati: III  TM Distance: >3 FB Neck ROM: full    Dental  (+) Chipped, Poor Dentition   Pulmonary neg shortness of breath, sleep apnea ,           Cardiovascular hypertension, (-) angina+ Peripheral Vascular Disease  (-) Past MI + Valvular Problems/Murmurs      Neuro/Psych PSYCHIATRIC DISORDERS  Neuromuscular disease    GI/Hepatic negative GI ROS, Neg liver ROS,   Endo/Other  diabetes, Type 2  Renal/GU Renal disease  negative genitourinary   Musculoskeletal   Abdominal   Peds  Hematology negative hematology ROS (+)   Anesthesia Other Findings Past Medical History: No date: Diabetes mellitus without complication (HCC) No date: Heart murmur No date: Hyperlipidemia No date: Hypertension No date: Sleep apnea  Past Surgical History: 03/31/2017: AMPUTATION; Right     Comment:  Procedure: RIGHT FOOT 1ST AND 2ND RAY AMPUTATION;                Surgeon: Newt Minion, MD;  Location: [place];  Service:               Orthopedics;  Laterality: Right; 04/01/2017: AMPUTATION; Right     Comment:  Procedure: AMPUTATION BELOW KNEE;  Surgeon: Newt Minion, MD;  Location: [place];  Service: Orthopedics;                Laterality: Right; No date: CORNEAL TRANSPLANT  BMI    Body Mass Index:  34.81 kg/m      Reproductive/Obstetrics negative OB ROS                             Anesthesia Physical Anesthesia Plan  ASA: III  Anesthesia Plan: General   Post-op Pain Management:    Induction: Intravenous  PONV Risk Score and Plan: Propofol infusion and TIVA  Airway Management Planned: Natural  Airway and Nasal Cannula  Additional Equipment:   Intra-op Plan:   Post-operative Plan:   Informed Consent: I have reviewed the patients History and Physical, chart, labs and discussed the procedure including the risks, benefits and alternatives for the proposed anesthesia with the patient or authorized representative who has indicated his/her understanding and acceptance.   Dental Advisory Given  Plan Discussed with: Anesthesiologist, CRNA and Surgeon  Anesthesia Plan Comments: (Patient consented for risks of anesthesia including but not limited to:  - adverse reactions to medications - risk of intubation if required - damage to teeth, lips or other oral mucosa - sore throat or hoarseness - Damage to heart, brain, lungs or loss of life  Patient voiced understanding.)        Anesthesia Quick Evaluation

## 2017-10-02 NOTE — H&P (Signed)
Trevor Bellows, MD [address], [place], [place], [place], [address] [address], [place], [place], [place], [address] Phone: [phone]  Fax: [phone]  Primary Care Physician:  [name], MD   Pre-Procedure History & Physical: HPI:  Trevor [name] is a 62 y.o. male is here for an colonoscopy.   Past Medical History:  Diagnosis Date  . Diabetes mellitus without complication ([place])   . Heart murmur   . Hyperlipidemia   . Hypertension   . Sleep apnea     Past Surgical History:  Procedure Laterality Date  . AMPUTATION Right 03/31/2017   Procedure: RIGHT FOOT 1ST AND 2ND RAY AMPUTATION;  Surgeon: Newt Minion, MD;  Location: [place];  Service: Orthopedics;  Laterality: Right;  . AMPUTATION Right 04/01/2017   Procedure: AMPUTATION BELOW KNEE;  Surgeon: Newt Minion, MD;  Location: [place];  Service: Orthopedics;  Laterality: Right;  . CORNEAL TRANSPLANT      Prior to Admission medications   Medication Sig Start Date End Date Taking? Authorizing Provider  acetaminophen (TYLENOL) 325 MG tablet Take 2 tablets (650 mg total) by mouth every 6 (six) hours as needed for mild pain (or Fever >/= 101). 04/12/17  Yes Katherine Roan, MD  atenolol (TENORMIN) 100 MG tablet Take by mouth.   Yes [provider]  chlorthalidone (HYGROTON) 25 MG tablet Take 0.5 tablets (12.5 mg total) by mouth daily. 04/28/17  Yes Angiulli, Lavon Paganini, PA-C  furosemide (LASIX) 40 MG tablet Take by mouth.   Yes [provider]  glipiZIDE (GLUCOTROL) 10 MG tablet Take 10 mg by mouth 2 (two) times daily before a meal.   Yes [provider]  insulin aspart (NOVOLOG) 100 UNIT/ML FlexPen Inject 10 Units into the skin 3 (three) times daily with meals. 04/27/17  Yes Angiulli, Lavon Paganini, PA-C  insulin detemir (LEVEMIR) 100 unit/ml SOLN Inject 0.34 mLs (34 Units total) into the skin at bedtime. 04/27/17  Yes Angiulli, Lavon Paganini, PA-C  losartan (COZAAR) 25 MG tablet Take 4 tablets (100  mg total) by mouth daily. 04/27/17  Yes Angiulli, Lavon Paganini, PA-C  metFORMIN (GLUCOPHAGE) 1000 MG tablet Take 1,000 mg by mouth 2 (two) times daily with a meal.    Yes [provider]  potassium chloride (KLOR-CON M15) 15 MEQ tablet Take 2 tablets (30 mEq total) by mouth 3 (three) times daily. Patient taking differently: Take 30 mEq by mouth 2 (two) times daily.  04/28/17  Yes Angiulli, Lavon Paganini, PA-C  traMADol (ULTRAM) 50 MG tablet Take 0.5-1 tablets (25-50 mg total) by mouth every 8 (eight) hours as needed for severe pain. 04/27/17  Yes Angiulli, Lavon Paganini, PA-C  atenolol (TENORMIN) 50 MG tablet Take 1 tablet (50 mg total) by mouth 2 (two) times daily. Patient not taking: Reported on 10/02/2017 04/27/17   Angiulli, Lavon Paganini, PA-C  meloxicam (MOBIC) 15 MG tablet Take by mouth. 03/25/15   [provider]  NIFEdipine (PROCARDIA XL/ADALAT-CC) 90 MG 24 hr tablet Take 1 tablet (90 mg total) by mouth daily. Patient taking differently: Take 120 mg by mouth daily.  04/27/17   Angiulli, Lavon Paganini, PA-C  polyethylene glycol (MIRALAX / GLYCOLAX) packet Take 17 g by mouth daily as needed for mild constipation. 04/04/17   Colbert Ewing, MD    Allergies as of 08/31/2017 - Review Complete 08/30/2017  Allergen Reaction Noted  . Enalapril Cough 11/22/2016    History reviewed. No pertinent family history.  Social  History   Socioeconomic History  . Marital status: Single    Spouse name: Not on file  . Number of children: Not on file  . Years of education: Not on file  . Highest education level: Not on file  Occupational History  . Not on file  Social Needs  . Financial resource strain: Not on file  . Food insecurity:    Worry: Not on file    Inability: Not on file  . Transportation needs:    Medical: Not on file    Non-medical: Not on file  Tobacco Use  . Smoking status: Never Smoker  . Smokeless tobacco: Never Used  Substance and Sexual Activity  . Alcohol use: No  . Drug use:  Never  . Sexual activity: Not on file  Lifestyle  . Physical activity:    Days per week: Not on file    Minutes per session: Not on file  . Stress: Not on file  Relationships  . Social connections:    Talks on phone: Not on file    Gets together: Not on file    Attends religious service: Not on file    Active member of club or organization: Not on file    Attends meetings of clubs or organizations: Not on file    Relationship status: Not on file  . Intimate partner violence:    Fear of current or ex partner: Not on file    Emotionally abused: Not on file    Physically abused: Not on file    Forced sexual activity: Not on file  Other Topics Concern  . Not on file  Social History Narrative  . Not on file    Review of Systems: See HPI, otherwise negative ROS  Physical Exam: BP 126/78   Pulse (!) 57   Temp (!) 96 F (35.6 C) (Tympanic)   Resp 20   Ht 6\' 4"  (1.93 m)   Wt 129.7 kg   SpO2 99%   BMI 34.81 kg/m  General:   Alert,  pleasant and cooperative in NAD Head:  Normocephalic and atraumatic. Neck:  Supple; no masses or thyromegaly. Lungs:  Clear throughout to auscultation, normal respiratory effort.    Heart:  +S1, +S2, Regular rate and rhythm, No edema. Abdomen:  Soft, nontender and nondistended. Normal bowel sounds, without guarding, and without rebound.   Neurologic:  Alert and  oriented x4;  grossly normal neurologically.  Impression/Plan: Trevor [name] is here for an colonoscopy to be performed for Screening colonoscopy average risk   Risks, benefits, limitations, and alternatives regarding  colonoscopy have been reviewed with the patient.  Questions have been answered.  All parties agreeable.   Trevor Bellows, MD  10/02/2017, 9:21 AM

## 2017-10-02 NOTE — Anesthesia Procedure Notes (Signed)
Performed by: Vaughan Sine Pre-anesthesia Checklist: Patient identified, Emergency Drugs available, Suction available, Patient being monitored and Timeout performed Patient Re-evaluated:Patient Re-evaluated prior to induction Oxygen Delivery Method: Simple face mask Preoxygenation: Pre-oxygenation with 100% oxygen Induction Type: IV induction Ventilation: Oral airway inserted - appropriate to patient size Airway Equipment and Method: Oral airway Placement Confirmation: positive ETCO2 and CO2 detector

## 2017-10-02 NOTE — Anesthesia Post-op Follow-up Note (Signed)
Anesthesia QCDR form completed.        

## 2017-10-02 NOTE — Anesthesia Postprocedure Evaluation (Signed)
Anesthesia Post Note  Patient: Trevor [name]  Procedure(s) Performed: COLONOSCOPY WITH PROPOFOL (N/A )  Patient location during evaluation: Endoscopy Anesthesia Type: General Level of consciousness: awake and alert Pain management: pain level controlled Vital Signs Assessment: post-procedure vital signs reviewed and stable Respiratory status: spontaneous breathing, nonlabored ventilation, respiratory function stable and patient connected to nasal cannula oxygen Cardiovascular status: blood pressure returned to baseline and stable Postop Assessment: no apparent nausea or vomiting Anesthetic complications: no     Last Vitals:  Vitals:   10/02/17 1010 10/02/17 1020  BP: 128/77 (!) 143/81  Pulse: (!) 53 (!) 56  Resp: (!) 9 10  Temp:    SpO2: 100% 99%    Last Pain:  Vitals:   10/02/17 1020  TempSrc:   PainSc: 0-No pain                 Precious Haws [place]

## 2017-10-02 NOTE — Op Note (Signed)
[facility] Gastroenterology Patient Name: Trevor [name] Procedure Date: 10/02/2017 9:26 AM MRN: [id number] Account #: [id number] Date of Birth: 11-Dec-1955 Admit Type: Outpatient Age: 62 Room: [facility] ENDO ROOM 4 Gender: Male Note Status: Finalized Procedure:            Colonoscopy Indications:          Screening for colorectal malignant neoplasm Providers:            [name] MD, MD Referring MD:         Edmonia Lynch. Aycock MD (Referring MD) Medicines:            Monitored Anesthesia Care Complications:        No immediate complications. Procedure:            Pre-Anesthesia Assessment:                       - Prior to the procedure, a History and Physical was                        performed, and patient medications, allergies and                        sensitivities were reviewed. The patient's tolerance of                        previous anesthesia was reviewed.                       - The risks and benefits of the procedure and the                        sedation options and risks were discussed with the                        patient. All questions were answered and informed                        consent was obtained.                       - ASA Grade Assessment: II - A patient with mild                        systemic disease.                       After obtaining informed consent, the colonoscope was                        passed under direct vision. Throughout the procedure,                        the patient's blood pressure, pulse, and oxygen                        saturations were monitored continuously. The                        Colonoscope was introduced through the anus and  advanced to the the cecum, identified by the                        appendiceal orifice, IC valve and transillumination.                        The colonoscopy was performed with ease. The patient                        tolerated the procedure well. The  quality of the bowel                        preparation was good. Findings:      The perianal and digital rectal examinations were normal.      A 7 mm polyp was found in the proximal ascending colon. The polyp was       sessile. The polyp was removed with a hot snare. Resection and retrieval       were complete.      A 18 mm polyp was found in the proximal transverse colon. The polyp was       pedunculated. The polyp was removed with a hot snare. Resection and       retrieval were complete. To prevent bleeding after the polypectomy, one       hemostatic clip was successfully placed. There was no bleeding at the       end of the procedure.      A 5 mm polyp was found in the rectum. The polyp was sessile. The polyp       was removed with a cold biopsy forceps. Resection and retrieval were       complete.      Non-bleeding internal hemorrhoids were found during retroflexion. The       hemorrhoids were medium-sized and Grade I (internal hemorrhoids that do       not prolapse).      The exam was otherwise without abnormality on direct and retroflexion       views. Impression:           - One 7 mm polyp in the proximal ascending colon,                        removed with a hot snare. Resected and retrieved.                       - One 18 mm polyp in the proximal transverse colon,                        removed with a hot snare. Resected and retrieved. Clip                        was placed.                       - One 5 mm polyp in the rectum, removed with a cold                        biopsy forceps. Resected and retrieved.                       - Non-bleeding internal hemorrhoids.                       -  The examination was otherwise normal on direct and                        retroflexion views. Recommendation:       - Discharge patient to home (with escort).                       - Resume previous diet.                       - Continue present medications.                       - Await  pathology results.                       - Repeat colonoscopy in 3 years for surveillance. Procedure Code(s):    --- Professional ---                       [id number], Colonoscopy, flexible; with removal of tumor(s),                        polyp(s), or other lesion(s) by snare technique                       45380, 62, Colonoscopy, flexible; with biopsy, single                        or multiple Diagnosis Code(s):    --- Professional ---                       Z12.11, Encounter for screening for malignant neoplasm                        of colon                       D12.2, Benign neoplasm of ascending colon                       D12.3, Benign neoplasm of transverse colon (hepatic                        flexure or splenic flexure)                       K62.1, Rectal polyp                       K64.0, First degree hemorrhoids CPT copyright 2017 American Medical Association. All rights reserved. The codes documented in this report are preliminary and upon coder review may  be revised to meet current compliance requirements. [name], MD [name] MD, MD 10/02/2017 9:59:31 AM This report has been signed electronically. Number of Addenda: 0 Note Initiated On: 10/02/2017 9:26 AM Scope Withdrawal Time: 0 hours 17 minutes 28 seconds  Total Procedure Duration: 0 hours 21 minutes 54 seconds       [facility]

## 2017-10-03 ENCOUNTER — Ambulatory Visit: Payer: Medicaid Other

## 2017-10-03 DIAGNOSIS — R2689 Other abnormalities of gait and mobility: Secondary | ICD-10-CM

## 2017-10-03 DIAGNOSIS — R2681 Unsteadiness on feet: Secondary | ICD-10-CM

## 2017-10-03 DIAGNOSIS — M6281 Muscle weakness (generalized): Secondary | ICD-10-CM

## 2017-10-03 LAB — SURGICAL PATHOLOGY

## 2017-10-03 NOTE — Therapy (Signed)
[place] MAIN [facility] SERVICES [address] [place], [place], [address] Phone: [phone]   Fax:  [phone]  Physical Therapy Treatment  Patient Details  Name: Trevor [name] MRN: [id number] Date of Birth: 1955-10-09 Referring Provider: Jamse Arn, DR   Encounter Date: 10/03/2017  PT End of Session - 10/03/17 0950    Visit Number  7    Number of Visits  13    Date for PT Re-Evaluation  10/24/17    Authorization Type   PN  2/10 start 9/17)     PT Start Time  0946    PT Stop Time  1031    PT Time Calculation (min)  45 min    Equipment Utilized During Treatment  Gait belt   R prosthesis   Activity Tolerance  Patient tolerated treatment well;Other (comment)    Behavior During Therapy  WFL for tasks assessed/performed       Past Medical History:  Diagnosis Date  . Diabetes mellitus without complication ([place])   . Heart murmur   . Hyperlipidemia   . Hypertension   . Sleep apnea     Past Surgical History:  Procedure Laterality Date  . AMPUTATION Right 03/31/2017   Procedure: RIGHT FOOT 1ST AND 2ND RAY AMPUTATION;  Surgeon: Newt Minion, MD;  Location: [place];  Service: Orthopedics;  Laterality: Right;  . AMPUTATION Right 04/01/2017   Procedure: AMPUTATION BELOW KNEE;  Surgeon: Newt Minion, MD;  Location: [place];  Service: Orthopedics;  Laterality: Right;  . COLONOSCOPY WITH PROPOFOL N/A 10/02/2017   Procedure: COLONOSCOPY WITH PROPOFOL;  Surgeon: [name], MD;  Location: [facility] ENDOSCOPY;  Service: Gastroenterology;  Laterality: N/A;  . CORNEAL TRANSPLANT      There were no vitals filed for this visit.    Subjective Assessment - 10/03/17 0943    Subjective  Patient had a colonoscopy yesterday.  Patient reports doing some exercises after last session but was limited due to fear of toileting incident.     Pertinent History  62 year old right-handed male with history of diabetes mellitus, hypertension, legally blind, and  CKD presenting for BKA 04/01/17. Patient wears contacts and can see better now.  Patient had two surgeries, one was a couple toes then they decided to go below the knee. 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 Has ambulated with RW in hospital with PT. Patient has had home health therapy and was in inpatient rehab about a month. Patient reports he was mainly sitting down, with occasional walking, last home health therapy in May. Patient reports some standing up but no exercises. Patient lives alone in the house and is scared they might fall. Patient received prosthetic leg Tuesday of last week.    Limitations  Standing;Walking;House hold activities;Other (comment)    How long can you stand comfortably?  10-15 minutes    How long can you walk comfortably?  10-15 minutes    Patient Stated Goals  going up steps and driving car.     Currently in Pain?  No/denies     Vitals: 122/644 pulse 61  donning/doffing prosthesis.3 ply sock. : difficulty attaining alignment of sleeve for pin insert into prosthesis. -took off 3 ply sock to allow for better fit  Patient required education on sock use: only utilize when needed, leg will swell and shrink throughout the day, with the weather, and with exercise which  in turn will depend on how many ply sock will be required. Patient verbalized understanding.    ambulate across stable and unstable surface outside. Negotiating changing surfaces from sidewalk across brick with turns and obstacles in pathway without LOB. Turn L and R while keeping feet inside RW with verbval cues for decreasing UE support.  Utilization of RW with w/c follow and CGA. Cues for widening BOS for increased stability   education/performing ascending/descending incline (handicap grade of incline/decline) carpeted with RW, CGA, and w/c follow. Patient cued for ascending incline with "knee to ceiling" for foot clearance and widening  BOS. Patient required cueing for descending decline for widening BOS and maintaining upright posture via abdominal activation to decrease forward trunk tilt and momentum.  SPT from [facility] to bed x 2 with CGA and RW x 1,  Prone hip flexor stretch 3 minute hold each LE                       PT Education - 10/03/17 0943    Education Details  ambulate outside, exercise technique     Person(s) Educated  Patient    Methods  Explanation;Demonstration;Verbal cues    Comprehension  Verbalized understanding;Returned demonstration       PT Short Term Goals - 09/26/17 1054      PT SHORT TERM GOAL #1   Title  Patient will be independent in home exercise program to improve strength/mobility for better functional independence with ADLs.    Baseline  HEP given     Time  2    Period  Weeks    Status  Partially Met      PT SHORT TERM GOAL #2   Title  Patient will don/doff prosthesis independently to allow for increased mobility in home.     Baseline  8/14: requires PT to guide/direct through process 8/28: independent    Time  2    Period  Weeks    Status  Achieved      PT SHORT TERM GOAL #3   Title  Patient will ambulate 10 meters with least assistive device and prosthesis to improve mobility in home.     Baseline  8/14: not walking outside of // bars yet  8/28: 63 seconds with RW and CGA     Time  2    Period  Weeks    Status  Achieved        PT Long Term Goals - 09/26/17 1054      PT LONG TERM GOAL #1   Title  Patient will ambulate 60 ft with least assistive AD and prosthesis to improve mobility around home and increase independence.     Baseline  8/14: requires use of // bars 8/28: 30 ft with RW and CGA 9/17: 74 ft x2 with CGA and RW     Time  4    Period  Weeks    Status  Achieved      PT LONG TERM GOAL #2   Title  Patient (> 11 years old) will complete five times sit to stand test in < 15 seconds indicating an increased LE strength and improved balance.     Baseline  8/14: 61 seconds 8/28; 42 seconds 9/17: 24 seconds BUE support with LLE    Time  4    Period  Weeks    Status  Partially Met    Target Date  10/24/17      PT LONG TERM GOAL #3  Title  Patient will reduce timed up and go to <11 seconds to reduce fall risk and demonstrate improved transfer/gait ability.    Baseline  8/14: unable to perform 8/28: 1 min 30 seconds 9/17: 38 seconds RW     Time  4    Period  Weeks    Status  Partially Met    Target Date  10/24/17      PT LONG TERM GOAL #4   Title  Patient will increase lower extremity functional scale to >60/80 to demonstrate improved functional mobility and increased tolerance with ADLs.     Baseline  814: 29/80 8/28: 20/80    Time  4    Period  Weeks    Status  Partially Met    Target Date  10/24/17      PT LONG TERM GOAL #5   Title  Patient will increase BLE gross strength to 4+/5 as to improve functional strength for independent gait, increased standing tolerance and increased ADL ability.    Baseline  8/14: 4-/15 8/28: 4-/5  9/17: L 4-/5 R 4/5     Time  4    Period  Weeks    Status  Achieved      PT LONG TERM GOAL #6   Title  Patient will perform 10 MWT in >.5 m/s for improved mobility and safety with negotiating natural environment    Baseline  8/28: .16 m/s with RW  9/17: .53 m/s     Time  4    Period  Weeks    Status  Achieved      PT LONG TERM GOAL #7   Title  Patient will perform 10 MWT in >1.0 m/s for improved mobility and safety with negotiating natural environment    Baseline  9/17: .53 m/s     Time  4    Period  Weeks    Status  New    Target Date  10/24/17      PT LONG TERM GOAL #8   Title  Patient will ambulate 300 ft with least assistive AD and prosthesis to improve mobility around home and increase independence.     Baseline  9/17: 74 ft CGA with RW     Time  4    Period  Weeks    Status  New    Target Date  10/24/17      PT LONG TERM GOAL  #9   TITLE  Patient will ascend/descend 4 stairs  with rail assist independently without loss of balance to improve ability to get in/out of home.     Baseline  9/17: unable to negotiate stairs     Time  4    Period  Weeks    Status  New    Target Date  10/24/17            Plan - 10/03/17 1048    Clinical Impression Statement  Patient introduced to outside mobility and negotiating inclines/declines. Patient required frequent verbal cues for sequencing and body mechanics to improve upright alignment and decrease reliance upon UE's. Patient demonstrated carryover and understanding for ambulation in different settings with turns to L and R. Patient will benefit from skilled physical therapy to increase functional mobility, improve prosthetic use, and increase independent ambulation for return to PLOF    Rehab Potential  Fair    Clinical Impairments Affecting Rehab Potential  (+) previous independence, motivation to return to walking (-) lives alone, hx of diabetes, limited vision  PT Frequency  2x / week    PT Duration  4 weeks    PT Treatment/Interventions  ADLs/Self Care Home Management;Cryotherapy;Electrical Stimulation;Ultrasound;Traction;Moist Heat;DME Instruction;Gait training;Stair training;Functional mobility training;Therapeutic activities;Therapeutic exercise;Patient/family education;Neuromuscular re-education;Balance training;Prosthetic Training;Wheelchair mobility training;Manual techniques;Manual lymph drainage;Compression bandaging;Taping;Energy conservation;Passive range of motion    PT Next Visit Plan  steps in // bars     PT Home Exercise Plan  see sheet    Consulted and Agree with Plan of Care  Patient       Patient will benefit from skilled therapeutic intervention in order to improve the following deficits and impairments:  Abnormal gait, Decreased activity tolerance, Decreased balance, Decreased knowledge of precautions, Decreased endurance, Decreased knowledge of use of DME, Decreased mobility, Decreased range of  motion, Difficulty walking, Decreased strength, Increased edema, Impaired flexibility, Impaired perceived functional ability, Prosthetic Dependency, Postural dysfunction, Improper body mechanics, Pain  Visit Diagnosis: Other abnormalities of gait and mobility  Muscle weakness (generalized)  Unsteadiness on feet     Problem List Patient Active Problem List   Diagnosis Date Noted  . Poorly controlled type 2 diabetes mellitus with peripheral neuropathy ([place])   . Flatulence   . Hypomagnesemia   . Unilateral complete BKA, right, sequela ([place])   . Benign essential HTN   . Hypoalbuminemia due to protein-calorie malnutrition ([place])   . S/P below knee amputation, right ([place]) 04/12/2017  . Acute blood loss anemia   . Other encephalopathy 04/08/2017  . Encephalopathy 04/08/2017  . Altered mental status 04/08/2017  . Labile blood pressure   . Labile blood glucose   . Drug induced constipation   . Stage 3 chronic kidney disease ([place])   . Bacteremia   . S/P unilateral BKA (below knee amputation), right ([place]) 04/04/2017  . PAD (peripheral artery disease) ([place])   . Type 2 diabetes mellitus with right diabetic foot ulcer ([place])   . Post-operative pain   . Legally blind   . Upper GI bleed   . Streptococcal bacteremia 04/01/2017  . Hypokalemia 03/30/2017  . Uncontrolled type 2 diabetes mellitus with hyperglycemia, with long-term current use of insulin ([place]) 03/30/2017  . Type 2 diabetes mellitus with peripheral neuropathy ([place]) 03/30/2017  . AKI (acute kidney injury) ([place]) 03/30/2017  . CKD stage 3 due to type 2 diabetes mellitus ([place]) 03/30/2017  . Sepsis ([place]) 03/30/2017  . Heart murmur 11/22/2016  . Hyperlipidemia associated with type 2 diabetes mellitus ([place]) 11/22/2016  . Obstructive sleep apnea syndrome 11/22/2016  . Essential hypertension 12/17/2012   Janna Arch, PT, DPT    10/03/2017, 10:50 AM  [place] MAIN [facility] SERVICES [address] [place], [place], [address] Phone: [phone]   Fax:  [phone]  Name: JAHKARI MACLIN MRN: [id number] Date of Birth: 06/14/1955

## 2017-10-10 ENCOUNTER — Ambulatory Visit: Payer: Medicaid Other | Attending: Physical Medicine & Rehabilitation

## 2017-10-10 DIAGNOSIS — S88111S Complete traumatic amputation at level between knee and ankle, right lower leg, sequela: Secondary | ICD-10-CM | POA: Diagnosis present

## 2017-10-10 DIAGNOSIS — R2689 Other abnormalities of gait and mobility: Secondary | ICD-10-CM | POA: Diagnosis present

## 2017-10-10 DIAGNOSIS — M6281 Muscle weakness (generalized): Secondary | ICD-10-CM | POA: Diagnosis present

## 2017-10-10 DIAGNOSIS — R2681 Unsteadiness on feet: Secondary | ICD-10-CM | POA: Insufficient documentation

## 2017-10-10 NOTE — Therapy (Signed)
[place] MAIN [facility] SERVICES [address] [place], [place], [address] Phone: [phone]   Fax:  [phone]  Physical Therapy Treatment  Patient Details  Name: Trevor [name] MRN: [id number] Date of Birth: Nov 23, 1955 Referring Provider (PT): Jamse Arn, DR   Encounter Date: 10/10/2017  PT End of Session - 10/10/17 1043    Visit Number  8    Number of Visits  13    Date for PT Re-Evaluation  10/24/17    Authorization Type   PN  3/10 start 9/17)     PT Start Time  1037    PT Stop Time  1115    PT Time Calculation (min)  38 min    Equipment Utilized During Treatment  Gait belt   R prosthesis   Activity Tolerance  Patient tolerated treatment well;Other (comment)    Behavior During Therapy  WFL for tasks assessed/performed       Past Medical History:  Diagnosis Date  . Diabetes mellitus without complication ([place])   . Heart murmur   . Hyperlipidemia   . Hypertension   . Sleep apnea     Past Surgical History:  Procedure Laterality Date  . AMPUTATION Right 03/31/2017   Procedure: RIGHT FOOT 1ST AND 2ND RAY AMPUTATION;  Surgeon: Newt Minion, MD;  Location: [place];  Service: Orthopedics;  Laterality: Right;  . AMPUTATION Right 04/01/2017   Procedure: AMPUTATION BELOW KNEE;  Surgeon: Newt Minion, MD;  Location: [place];  Service: Orthopedics;  Laterality: Right;  . COLONOSCOPY WITH PROPOFOL N/A 10/02/2017   Procedure: COLONOSCOPY WITH PROPOFOL;  Surgeon: [name], MD;  Location: [facility];  Service: Gastroenterology;  Laterality: N/A;  . CORNEAL TRANSPLANT      There were no vitals filed for this visit.  Subjective Assessment - 10/10/17 1040    Subjective  Patient arrived late to session today because he had to go to the bathroom. Reports no falls or LOB.     Pertinent History  62 year old right-handed male with history of diabetes mellitus, hypertension, legally blind, and CKD presenting for BKA 04/01/17. Patient  wears contacts and can see better now.  Patient had two surgeries, one was a couple toes then they decided to go below the knee. 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 Has ambulated with RW in hospital with PT. Patient has had home health therapy and was in inpatient rehab about a month. Patient reports he was mainly sitting down, with occasional walking, last home health therapy in May. Patient reports some standing up but no exercises. Patient lives alone in the house and is scared they might fall. Patient received prosthetic leg Tuesday of last week.    Limitations  Standing;Walking;House hold activities;Other (comment)    How long can you stand comfortably?  10-15 minutes    How long can you walk comfortably?  10-15 minutes    Patient Stated Goals  going up steps and driving car.     Currently in Pain?  No/denies        Vitals 129/68 pulse 60      6" step in // bars 10x each leg donning/doffing prosthesis.3 ply sock. : difficulty attaining alignment of sleeve for pin insert into prosthesis. -took off 3 ply sock to allow for better fit  Ambulate in // bars with 2x4 plank between feet for widening BOS, ambulating with decreased  UE support to perform 4 consecutive with only SUE support.   March on either side of 2x4 plank 10x each leg with LUE support only  Side step in // bars 4x length of bars, CGA  Standing abduction 10x each leg in // bars  ambulate across stable and unstable surface outside. Negotiating changing surfaces from grass to sidewalk, across brick with turns and obstacles in pathway without LOB. utilizing RW and CGA with w/c follow. Cues for widening BOS and decrease UE reliance.                         PT Education - 10/10/17 1041    Education Details  ambulate longer duration, exercise technique     Person(s) Educated  Patient    Methods  Explanation;Demonstration;Verbal cues     Comprehension  Verbalized understanding;Returned demonstration       PT Short Term Goals - 09/26/17 1054      PT SHORT TERM GOAL #1   Title  Patient will be independent in home exercise program to improve strength/mobility for better functional independence with ADLs.    Baseline  HEP given     Time  2    Period  Weeks    Status  Partially Met      PT SHORT TERM GOAL #2   Title  Patient will don/doff prosthesis independently to allow for increased mobility in home.     Baseline  8/14: requires PT to guide/direct through process 8/28: independent    Time  2    Period  Weeks    Status  Achieved      PT SHORT TERM GOAL #3   Title  Patient will ambulate 10 meters with least assistive device and prosthesis to improve mobility in home.     Baseline  8/14: not walking outside of // bars yet  8/28: 63 seconds with RW and CGA     Time  2    Period  Weeks    Status  Achieved        PT Long Term Goals - 09/26/17 1054      PT LONG TERM GOAL #1   Title  Patient will ambulate 60 ft with least assistive AD and prosthesis to improve mobility around home and increase independence.     Baseline  8/14: requires use of // bars 8/28: 30 ft with RW and CGA 9/17: 74 ft x2 with CGA and RW     Time  4    Period  Weeks    Status  Achieved      PT LONG TERM GOAL #2   Title  Patient (> 44 years old) will complete five times sit to stand test in < 15 seconds indicating an increased LE strength and improved balance.    Baseline  8/14: 61 seconds 8/28; 42 seconds 9/17: 24 seconds BUE support with LLE    Time  4    Period  Weeks    Status  Partially Met    Target Date  10/24/17      PT LONG TERM GOAL #3   Title  Patient will reduce timed up and go to <11 seconds to reduce fall risk and demonstrate improved transfer/gait ability.    Baseline  8/14: unable to perform 8/28: 1 min 30 seconds 9/17: 38 seconds RW     Time  4    Period  Weeks    Status  Partially Met  Target Date  10/24/17      PT  LONG TERM GOAL #4   Title  Patient will increase lower extremity functional scale to >60/80 to demonstrate improved functional mobility and increased tolerance with ADLs.     Baseline  814: 29/80 8/28: 20/80    Time  4    Period  Weeks    Status  Partially Met    Target Date  10/24/17      PT LONG TERM GOAL #5   Title  Patient will increase BLE gross strength to 4+/5 as to improve functional strength for independent gait, increased standing tolerance and increased ADL ability.    Baseline  8/14: 4-/15 8/28: 4-/5  9/17: L 4-/5 R 4/5     Time  4    Period  Weeks    Status  Achieved      PT LONG TERM GOAL #6   Title  Patient will perform 10 MWT in >.5 m/s for improved mobility and safety with negotiating natural environment    Baseline  8/28: .16 m/s with RW  9/17: .53 m/s     Time  4    Period  Weeks    Status  Achieved      PT LONG TERM GOAL #7   Title  Patient will perform 10 MWT in >1.0 m/s for improved mobility and safety with negotiating natural environment    Baseline  9/17: .53 m/s     Time  4    Period  Weeks    Status  New    Target Date  10/24/17      PT LONG TERM GOAL #8   Title  Patient will ambulate 300 ft with least assistive AD and prosthesis to improve mobility around home and increase independence.     Baseline  9/17: 74 ft CGA with RW     Time  4    Period  Weeks    Status  New    Target Date  10/24/17      PT LONG TERM GOAL  #9   TITLE  Patient will ascend/descend 4 stairs with rail assist independently without loss of balance to improve ability to get in/out of home.     Baseline  9/17: unable to negotiate stairs     Time  4    Period  Weeks    Status  New    Target Date  10/24/17            Plan - 10/10/17 1113    Clinical Impression Statement  Patient demonstrated increased ability to ambulate with decreased UE support in // bars. Patient ambulated outside requiring frequent cueing for widening BOS and decreasing pressure through UE's. Patient  session limited in duration due to patient being late. Patient will benefit from skilled physical therapy to increase functional mobility, improve prosthetic use, and increase independent ambulation for return to PLOF    Rehab Potential  Fair    Clinical Impairments Affecting Rehab Potential  (+) previous independence, motivation to return to walking (-) lives alone, hx of diabetes, limited vision     PT Frequency  2x / week    PT Duration  4 weeks    PT Treatment/Interventions  ADLs/Self Care Home Management;Cryotherapy;Electrical Stimulation;Ultrasound;Traction;Moist Heat;DME Instruction;Gait training;Stair training;Functional mobility training;Therapeutic activities;Therapeutic exercise;Patient/family education;Neuromuscular re-education;Balance training;Prosthetic Training;Wheelchair mobility training;Manual techniques;Manual lymph drainage;Compression bandaging;Taping;Energy conservation;Passive range of motion    PT Next Visit Plan  steps in // bars     PT  Home Exercise Plan  see sheet    Consulted and Agree with Plan of Care  Patient       Patient will benefit from skilled therapeutic intervention in order to improve the following deficits and impairments:  Abnormal gait, Decreased activity tolerance, Decreased balance, Decreased knowledge of precautions, Decreased endurance, Decreased knowledge of use of DME, Decreased mobility, Decreased range of motion, Difficulty walking, Decreased strength, Increased edema, Impaired flexibility, Impaired perceived functional ability, Prosthetic Dependency, Postural dysfunction, Improper body mechanics, Pain  Visit Diagnosis: Other abnormalities of gait and mobility  Muscle weakness (generalized)  Unsteadiness on feet     Problem List Patient Active Problem List   Diagnosis Date Noted  . Poorly controlled type 2 diabetes mellitus with peripheral neuropathy ([place])   . Flatulence   . Hypomagnesemia   . Unilateral complete BKA, right, sequela  ([place])   . Benign essential HTN   . Hypoalbuminemia due to protein-calorie malnutrition ([place])   . S/P below knee amputation, right ([place]) 04/12/2017  . Acute blood loss anemia   . Other encephalopathy 04/08/2017  . Encephalopathy 04/08/2017  . Altered mental status 04/08/2017  . Labile blood pressure   . Labile blood glucose   . Drug induced constipation   . Stage 3 chronic kidney disease ([place])   . Bacteremia   . S/P unilateral BKA (below knee amputation), right ([place]) 04/04/2017  . PAD (peripheral artery disease) ([place])   . Type 2 diabetes mellitus with right diabetic foot ulcer ([place])   . Post-operative pain   . Legally blind   . Upper GI bleed   . Streptococcal bacteremia 04/01/2017  . Hypokalemia 03/30/2017  . Uncontrolled type 2 diabetes mellitus with hyperglycemia, with long-term current use of insulin ([place]) 03/30/2017  . Type 2 diabetes mellitus with peripheral neuropathy ([place]) 03/30/2017  . AKI (acute kidney injury) ([place]) 03/30/2017  . CKD stage 3 due to type 2 diabetes mellitus ([place]) 03/30/2017  . Sepsis ([place]) 03/30/2017  . Heart murmur 11/22/2016  . Hyperlipidemia associated with type 2 diabetes mellitus ([place]) 11/22/2016  . Obstructive sleep apnea syndrome 11/22/2016  . Essential hypertension 12/17/2012   Janna Arch, PT, DPT   10/10/2017, 11:30 AM  [place] MAIN [facility] SERVICES [address] [place], [place], [address] Phone: [phone]   Fax:  [phone]  Name: CRUZE ZINGARO MRN: [id number] Date of Birth: 1955/12/15

## 2017-10-12 ENCOUNTER — Encounter: Payer: Self-pay | Admitting: Physical Medicine & Rehabilitation

## 2017-10-12 ENCOUNTER — Encounter: Payer: Medicaid Other | Attending: Physical Medicine & Rehabilitation | Admitting: Physical Medicine & Rehabilitation

## 2017-10-12 ENCOUNTER — Other Ambulatory Visit: Payer: Self-pay

## 2017-10-12 ENCOUNTER — Ambulatory Visit: Payer: Medicaid Other | Admitting: Podiatry

## 2017-10-12 VITALS — BP 123/70 | HR 61

## 2017-10-12 DIAGNOSIS — E876 Hypokalemia: Secondary | ICD-10-CM | POA: Insufficient documentation

## 2017-10-12 DIAGNOSIS — E785 Hyperlipidemia, unspecified: Secondary | ICD-10-CM | POA: Diagnosis not present

## 2017-10-12 DIAGNOSIS — G4730 Sleep apnea, unspecified: Secondary | ICD-10-CM | POA: Diagnosis not present

## 2017-10-12 DIAGNOSIS — E1122 Type 2 diabetes mellitus with diabetic chronic kidney disease: Secondary | ICD-10-CM | POA: Insufficient documentation

## 2017-10-12 DIAGNOSIS — D649 Anemia, unspecified: Secondary | ICD-10-CM | POA: Diagnosis not present

## 2017-10-12 DIAGNOSIS — N189 Chronic kidney disease, unspecified: Secondary | ICD-10-CM | POA: Insufficient documentation

## 2017-10-12 DIAGNOSIS — Z89511 Acquired absence of right leg below knee: Secondary | ICD-10-CM | POA: Diagnosis not present

## 2017-10-12 DIAGNOSIS — I10 Essential (primary) hypertension: Secondary | ICD-10-CM

## 2017-10-12 DIAGNOSIS — K921 Melena: Secondary | ICD-10-CM | POA: Diagnosis not present

## 2017-10-12 DIAGNOSIS — H548 Legal blindness, as defined in USA: Secondary | ICD-10-CM | POA: Diagnosis not present

## 2017-10-12 DIAGNOSIS — Z4781 Encounter for orthopedic aftercare following surgical amputation: Secondary | ICD-10-CM | POA: Diagnosis present

## 2017-10-12 DIAGNOSIS — S88111S Complete traumatic amputation at level between knee and ankle, right lower leg, sequela: Secondary | ICD-10-CM | POA: Diagnosis not present

## 2017-10-12 DIAGNOSIS — E1142 Type 2 diabetes mellitus with diabetic polyneuropathy: Secondary | ICD-10-CM | POA: Insufficient documentation

## 2017-10-12 DIAGNOSIS — R269 Unspecified abnormalities of gait and mobility: Secondary | ICD-10-CM | POA: Diagnosis not present

## 2017-10-12 DIAGNOSIS — I129 Hypertensive chronic kidney disease with stage 1 through stage 4 chronic kidney disease, or unspecified chronic kidney disease: Secondary | ICD-10-CM | POA: Insufficient documentation

## 2017-10-12 NOTE — Progress Notes (Signed)
Subjective:    Patient ID: Trevor [name], male    DOB: 1955-10-01, 62 y.o.   MRN: [id number]  HPI 62 year old right-handed male with history of diabetes mellitus, hypertension, legally blind, and CKD presents for hospital follow up for right BKA.    Last clinic visit 08/10/17.  Since that time, pt states he is PT for gait training. He sees Ortho at the end of the month.  Pain is relatively controlled. BP is controlled. Denies falls. He is only using prosthesis with therapies at present.   Pain Inventory Average Pain 4 Pain Right Now 0 My pain is intermittent and dull  In the last 24 hours, has pain interfered with the following? General activity 2 Relation with others 0 Enjoyment of life 0 What TIME of day is your pain at its worst? evening Sleep (in general) Fair  Pain is worse with: none Pain improves with: rest and medication Relief from Meds: 10  Mobility ability to climb steps?  no do you drive?  no use a wheelchair transfers alone  Function not employed: date last employed . I need assistance with the following:  bathing, meal prep, household duties and shopping  Neuro/Psych numbness tingling  Physicians involved in your care hospital f/u   No family history on file. Social History   Socioeconomic History  . Marital status: Single    Spouse name: Not on file  . Number of children: Not on file  . Years of education: Not on file  . Highest education level: Not on file  Occupational History  . Not on file  Social Needs  . Financial resource strain: Not on file  . Food insecurity:    Worry: Not on file    Inability: Not on file  . Transportation needs:    Medical: Not on file    Non-medical: Not on file  Tobacco Use  . Smoking status: Never Smoker  . Smokeless tobacco: Never Used  Substance and Sexual Activity  . Alcohol use: No  . Drug use: Never  . Sexual activity: Not on file  Lifestyle  . Physical activity:    Days per week: Not on file      Minutes per session: Not on file  . Stress: Not on file  Relationships  . Social connections:    Talks on phone: Not on file    Gets together: Not on file    Attends religious service: Not on file    Active member of club or organization: Not on file    Attends meetings of clubs or organizations: Not on file    Relationship status: Not on file  Other Topics Concern  . Not on file  Social History Narrative  . Not on file   Past Surgical History:  Procedure Laterality Date  . AMPUTATION Right 03/31/2017   Procedure: RIGHT FOOT 1ST AND 2ND RAY AMPUTATION;  Surgeon: Newt Minion, MD;  Location: [place];  Service: Orthopedics;  Laterality: Right;  . AMPUTATION Right 04/01/2017   Procedure: AMPUTATION BELOW KNEE;  Surgeon: Newt Minion, MD;  Location: [place];  Service: Orthopedics;  Laterality: Right;  . COLONOSCOPY WITH PROPOFOL N/A 10/02/2017   Procedure: COLONOSCOPY WITH PROPOFOL;  Surgeon: [name], MD;  Location: [facility];  Service: Gastroenterology;  Laterality: N/A;  . CORNEAL TRANSPLANT     Past Medical History:  Diagnosis Date  . Diabetes mellitus without complication ([place])   . Heart murmur   . Hyperlipidemia   . Hypertension   .  Sleep apnea    BP 123/70   Pulse 61   SpO2 95%   Opioid Risk Score:   Fall Risk Score:  `1  Depression screen PHQ 2/9  Depression screen [facility] 2/9 10/12/2017 08/10/2017 06/26/2017 05/11/2017 03/29/2017 11/22/2016  Decreased Interest 0 0 0 0 2 0  Down, Depressed, Hopeless 0 0 0 0 0 0  PHQ - 2 Score 0 0 0 0 2 0  Altered sleeping - - 1 1 0 1  Tired, decreased energy - - 1 0 3 1  Change in appetite - - - 0 1 0  Feeling bad or failure about yourself  - - 0 0 0 0  Trouble concentrating - - 0 0 0 0  Moving slowly or fidgety/restless - - 0 0 1 1  Suicidal thoughts - - 0 0 0 0  PHQ-9 Score - - 2 1 7 3     Review of Systems  Constitutional: Positive for unexpected weight change.  Eyes: Negative.   Respiratory: Negative.   Cardiovascular:  Negative.   Gastrointestinal: Negative.   Endocrine: Negative.        High blood sugar  Genitourinary: Negative.   Musculoskeletal: Positive for gait problem.  Skin: Negative.   Allergic/Immunologic: Negative.   Neurological: Positive for numbness.       Tingling   All other systems reviewed and are negative.      Objective:   Physical Exam Constitutional: No distress . Vital signs reviewed. HEENT: EOMI, oral membranes moist Cardiovascular: RRR. No JVD    Respiratory: CTA bilaterally. Normal effort    GI: BS +, non-distended   Musculoskeletal: Stump less tender Neurological: He is alert and oriented.  Followed simple commands. Motor: B/l UE, LLE 5/5 proximal to distal LLE: 5/5 proximal to distal  Right HF, KE: 5/5  Skin: Stump healed Psychiatric: Alert and appropriate    Assessment & Plan:  62 year old right-handed male with history of diabetes mellitus, hypertension, legally blind, and CKD presents for hospital follow up for right BKA.    1.  Right transtibial amputation   Cont PT for gait training  Cont to follow up with Ortho  Prescription fo r  2. Pain Management with phantom limb pain  Relatively controlled at present  3.  Hypertension  Controlled today  Cont meds  4. Gait abnormality  Cont Therapies   Cont wheelchair for safety, using prosthesis with PT  5. Legally blind  Improved with contact lenses

## 2017-10-13 ENCOUNTER — Ambulatory Visit: Payer: Medicaid Other

## 2017-10-13 DIAGNOSIS — R2689 Other abnormalities of gait and mobility: Secondary | ICD-10-CM

## 2017-10-13 DIAGNOSIS — M6281 Muscle weakness (generalized): Secondary | ICD-10-CM

## 2017-10-13 DIAGNOSIS — R2681 Unsteadiness on feet: Secondary | ICD-10-CM

## 2017-10-13 NOTE — Therapy (Addendum)
[place] MAIN [facility] SERVICES [address] [place], [place], [address] Phone: [phone]   Fax:  [phone]  Physical Therapy Treatment  Patient Details  Name: Trevor [name] MRN: [id number] Date of Birth: 02/14/1955 Referring Provider (PT): Jamse Arn, DR   Encounter Date: 10/13/2017  PT End of Session - 10/13/17 1114    Visit Number  9    Number of Visits  13    Date for PT Re-Evaluation  10/24/17    Authorization Type   PN  4/10 start 9/17)     PT Start Time  1020    PT Stop Time  1106    PT Time Calculation (min)  46 min    Equipment Utilized During Treatment  Gait belt   R prosthesis   Activity Tolerance  Patient tolerated treatment well;Other (comment)    Behavior During Therapy  WFL for tasks assessed/performed       Past Medical History:  Diagnosis Date  . Diabetes mellitus without complication ([place])   . Heart murmur   . Hyperlipidemia   . Hypertension   . Sleep apnea     Past Surgical History:  Procedure Laterality Date  . AMPUTATION Right 03/31/2017   Procedure: RIGHT FOOT 1ST AND 2ND RAY AMPUTATION;  Surgeon: Newt Minion, MD;  Location: [place];  Service: Orthopedics;  Laterality: Right;  . AMPUTATION Right 04/01/2017   Procedure: AMPUTATION BELOW KNEE;  Surgeon: Newt Minion, MD;  Location: [place];  Service: Orthopedics;  Laterality: Right;  . COLONOSCOPY WITH PROPOFOL N/A 10/02/2017   Procedure: COLONOSCOPY WITH PROPOFOL;  Surgeon: [name], MD;  Location: [facility] ENDOSCOPY;  Service: Gastroenterology;  Laterality: N/A;  . CORNEAL TRANSPLANT      There were no vitals filed for this visit.  Subjective Assessment - 10/13/17 1024    Subjective  Patient reports he is doing well. States he hasn't been wearing prosthetic as much last couple days because he has had multiple doctors appointments.     Pertinent History  62 year old right-handed male with history of diabetes mellitus, hypertension, legally blind,  and CKD presenting for BKA 04/01/17. Patient wears contacts and can see better now.  Patient had two surgeries, one was a couple toes then they decided to go below the knee. 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 Has ambulated with RW in hospital with PT. Patient has had home health therapy and was in inpatient rehab about a month. Patient reports he was mainly sitting down, with occasional walking, last home health therapy in May. Patient reports some standing up but no exercises. Patient lives alone in the house and is scared they might fall. Patient received prosthetic leg Tuesday of last week.    Limitations  Standing;Walking;House hold activities;Other (comment)    How long can you stand comfortably?  10-15 minutes    How long can you walk comfortably?  10-15 minutes    Patient Stated Goals  going up steps and driving car.     Currently in Pain?  No/denies         Vitals 116/53mHg pulse 62bpm   Donning/doffing prosthesis.3 ply sock. :   Ambulating ascending/descending incline (handicap grade of incline/decline) carpeted with RW, CGA, and w/c follow. Patient cued for ascending incline with "knee to ceiling" for foot clearance and widening BOS. Patient required cueing for descending decline for widening BOS and  maintaining upright posture via abdominal activation to decrease forward trunk tilt and momentum. x4 with 1 seated rest break   6" step in // bars 10x. Verbal cues to go up with LLE and down with prosthetic leg.   Ambulate in // bars with 2x4 plank between feet for widening BOS, ambulating with decreased UE support to perform 4 consecutive with only SUE support.    March on either side of 2x4 plank 10x each leg with LUE support only in front of mirror.     Standing tapping 3 colored stepping stones with each LE. Completed 1-2-3- step commands. Also completed with eyes closed tapping with RLE for increased  spatial awareness of prosthetic.                     PT Education - 10/13/17 1017    Education Details  ambulate longer duration, exercise technique    Person(s) Educated  Patient    Methods  Explanation;Demonstration;Verbal cues    Comprehension  Verbalized understanding;Returned demonstration       PT Short Term Goals - 09/26/17 1054      PT SHORT TERM GOAL #1   Title  Patient will be independent in home exercise program to improve strength/mobility for better functional independence with ADLs.    Baseline  HEP given     Time  2    Period  Weeks    Status  Partially Met      PT SHORT TERM GOAL #2   Title  Patient will don/doff prosthesis independently to allow for increased mobility in home.     Baseline  8/14: requires PT to guide/direct through process 8/28: independent    Time  2    Period  Weeks    Status  Achieved      PT SHORT TERM GOAL #3   Title  Patient will ambulate 10 meters with least assistive device and prosthesis to improve mobility in home.     Baseline  8/14: not walking outside of // bars yet  8/28: 63 seconds with RW and CGA     Time  2    Period  Weeks    Status  Achieved        PT Long Term Goals - 09/26/17 1054      PT LONG TERM GOAL #1   Title  Patient will ambulate 60 ft with least assistive AD and prosthesis to improve mobility around home and increase independence.     Baseline  8/14: requires use of // bars 8/28: 30 ft with RW and CGA 9/17: 74 ft x2 with CGA and RW     Time  4    Period  Weeks    Status  Achieved      PT LONG TERM GOAL #2   Title  Patient (> 46 years old) will complete five times sit to stand test in < 15 seconds indicating an increased LE strength and improved balance.    Baseline  8/14: 61 seconds 8/28; 42 seconds 9/17: 24 seconds BUE support with LLE    Time  4    Period  Weeks    Status  Partially Met    Target Date  10/24/17      PT LONG TERM GOAL #3   Title  Patient will reduce timed up and go  to <11 seconds to reduce fall risk and demonstrate improved transfer/gait ability.    Baseline  8/14: unable to perform 8/28: 1 min  30 seconds 9/17: 38 seconds RW     Time  4    Period  Weeks    Status  Partially Met    Target Date  10/24/17      PT LONG TERM GOAL #4   Title  Patient will increase lower extremity functional scale to >60/80 to demonstrate improved functional mobility and increased tolerance with ADLs.     Baseline  814: 29/80 8/28: 20/80    Time  4    Period  Weeks    Status  Partially Met    Target Date  10/24/17      PT LONG TERM GOAL #5   Title  Patient will increase BLE gross strength to 4+/5 as to improve functional strength for independent gait, increased standing tolerance and increased ADL ability.    Baseline  8/14: 4-/15 8/28: 4-/5  9/17: L 4-/5 R 4/5     Time  4    Period  Weeks    Status  Achieved      PT LONG TERM GOAL #6   Title  Patient will perform 10 MWT in >.5 m/s for improved mobility and safety with negotiating natural environment    Baseline  8/28: .16 m/s with RW  9/17: .53 m/s     Time  4    Period  Weeks    Status  Achieved      PT LONG TERM GOAL #7   Title  Patient will perform 10 MWT in >1.0 m/s for improved mobility and safety with negotiating natural environment    Baseline  9/17: .53 m/s     Time  4    Period  Weeks    Status  New    Target Date  10/24/17      PT LONG TERM GOAL #8   Title  Patient will ambulate 300 ft with least assistive AD and prosthesis to improve mobility around home and increase independence.     Baseline  9/17: 74 ft CGA with RW     Time  4    Period  Weeks    Status  New    Target Date  10/24/17      PT LONG TERM GOAL  #9   TITLE  Patient will ascend/descend 4 stairs with rail assist independently without loss of balance to improve ability to get in/out of home.     Baseline  9/17: unable to negotiate stairs     Time  4    Period  Weeks    Status  New    Target Date  10/24/17             Plan - 10/13/17 1114    Clinical Impression Statement  Patient ambulated incline/decline with fewer cues but still needed occasional cueing for RLE foot clearance and increase BOS. Completed standing tasks in //bars in front of mirror to increase spatial awareness and proprioception of RLE. Was able to complete standing tapping with eyes closed successfully with eye closed. Patient will benefit from skilled physical therapy to increase functional mobility, improve prosthetic use, and increase ambulation for return to PLOF.     Rehab Potential  Fair    Clinical Impairments Affecting Rehab Potential  (+) previous independence, motivation to return to walking (-) lives alone, hx of diabetes, limited vision     PT Frequency  2x / week    PT Duration  4 weeks    PT Treatment/Interventions  ADLs/Self Care Home Management;Cryotherapy;Electrical Stimulation;Ultrasound;Traction;Moist  Heat;DME Instruction;Gait training;Stair training;Functional mobility training;Therapeutic activities;Therapeutic exercise;Patient/family education;Neuromuscular re-education;Balance training;Prosthetic Training;Wheelchair mobility training;Manual techniques;Manual lymph drainage;Compression bandaging;Taping;Energy conservation;Passive range of motion    PT Next Visit Plan  steps in // bars     PT Home Exercise Plan  see sheet    Consulted and Agree with Plan of Care  Patient       Patient will benefit from skilled therapeutic intervention in order to improve the following deficits and impairments:  Abnormal gait, Decreased activity tolerance, Decreased balance, Decreased knowledge of precautions, Decreased endurance, Decreased knowledge of use of DME, Decreased mobility, Decreased range of motion, Difficulty walking, Decreased strength, Increased edema, Impaired flexibility, Impaired perceived functional ability, Prosthetic Dependency, Postural dysfunction, Improper body mechanics, Pain  Visit Diagnosis: Other  abnormalities of gait and mobility  Muscle weakness (generalized)  Unsteadiness on feet     Problem List Patient Active Problem List   Diagnosis Date Noted  . Abnormality of gait 10/12/2017  . Poorly controlled type 2 diabetes mellitus with peripheral neuropathy ([place])   . Flatulence   . Hypomagnesemia   . Unilateral complete BKA, right, sequela ([place])   . Benign essential HTN   . Hypoalbuminemia due to protein-calorie malnutrition ([place])   . S/P below knee amputation, right ([place]) 04/12/2017  . Acute blood loss anemia   . Other encephalopathy 04/08/2017  . Encephalopathy 04/08/2017  . Altered mental status 04/08/2017  . Labile blood pressure   . Labile blood glucose   . Drug induced constipation   . Stage 3 chronic kidney disease ([place])   . Bacteremia   . S/P unilateral BKA (below knee amputation), right ([place]) 04/04/2017  . PAD (peripheral artery disease) ([place])   . Type 2 diabetes mellitus with right diabetic foot ulcer ([place])   . Post-operative pain   . Legally blind   . Upper GI bleed   . Streptococcal bacteremia 04/01/2017  . Hypokalemia 03/30/2017  . Uncontrolled type 2 diabetes mellitus with hyperglycemia, with long-term current use of insulin ([place]) 03/30/2017  . Type 2 diabetes mellitus with peripheral neuropathy ([place]) 03/30/2017  . AKI (acute kidney injury) ([place]) 03/30/2017  . CKD stage 3 due to type 2 diabetes mellitus ([place]) 03/30/2017  . Sepsis ([place]) 03/30/2017  . Heart murmur 11/22/2016  . Hyperlipidemia associated with type 2 diabetes mellitus ([place]) 11/22/2016  . Obstructive sleep apnea syndrome 11/22/2016  . Essential hypertension 12/17/2012    This entire session was performed under direct supervision and direction of a licensed therapist/therapist assistant . I have personally read, edited and approve of the note as written.   Phillips Grout PT, DPT, GCS  Erick Blinks, SPT 10/13/2017, 11:21 AM  [place] MAIN [facility]  SERVICES [address] [place], [place], [address] Phone: [phone]   Fax:  [phone]  Name: HALLIS MEDITZ MRN: [id number] Date of Birth: 1955-11-15

## 2017-10-16 ENCOUNTER — Ambulatory Visit: Payer: Medicaid Other

## 2017-10-16 DIAGNOSIS — M6281 Muscle weakness (generalized): Secondary | ICD-10-CM

## 2017-10-16 DIAGNOSIS — R2681 Unsteadiness on feet: Secondary | ICD-10-CM

## 2017-10-16 DIAGNOSIS — R2689 Other abnormalities of gait and mobility: Secondary | ICD-10-CM

## 2017-10-16 NOTE — Therapy (Signed)
[place] MAIN [facility] SERVICES [address] [place], [place], [address] Phone: [phone]   Fax:  [phone]  Physical Therapy Treatment  Patient Details  Name: Trevor [name] MRN: [id number] Date of Birth: 17-Dec-1955 Referring Provider (PT): Jamse Arn, DR   Encounter Date: 10/16/2017  PT End of Session - 10/16/17 1209    Visit Number  10    Number of Visits  13    Date for PT Re-Evaluation  10/24/17    Authorization Type   PN  5/10 start 9/17)     PT Start Time  1101    PT Stop Time  1147    PT Time Calculation (min)  46 min    Equipment Utilized During Treatment  Gait belt   R prosthesis   Activity Tolerance  Patient tolerated treatment well;Other (comment)    Behavior During Therapy  WFL for tasks assessed/performed       Past Medical History:  Diagnosis Date  . Diabetes mellitus without complication ([place])   . Heart murmur   . Hyperlipidemia   . Hypertension   . Sleep apnea     Past Surgical History:  Procedure Laterality Date  . AMPUTATION Right 03/31/2017   Procedure: RIGHT FOOT 1ST AND 2ND RAY AMPUTATION;  Surgeon: Newt Minion, MD;  Location: [place];  Service: Orthopedics;  Laterality: Right;  . AMPUTATION Right 04/01/2017   Procedure: AMPUTATION BELOW KNEE;  Surgeon: Newt Minion, MD;  Location: [place];  Service: Orthopedics;  Laterality: Right;  . COLONOSCOPY WITH PROPOFOL N/A 10/02/2017   Procedure: COLONOSCOPY WITH PROPOFOL;  Surgeon: [name], MD;  Location: [facility] ENDOSCOPY;  Service: Gastroenterology;  Laterality: N/A;  . CORNEAL TRANSPLANT      There were no vitals filed for this visit.  Subjective Assessment - 10/16/17 1105    Subjective  Patient reports having some pain in posterior aspect of R hip for the first time a few minutes ago. Patient reports compliance with his HEP.     Pertinent History  62 year old right-handed male with history of diabetes mellitus, hypertension, legally blind, and CKD  presenting for BKA 04/01/17. Patient wears contacts and can see better now.  Patient had two surgeries, one was a couple toes then they decided to go below the knee. 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 Has ambulated with RW in hospital with PT. Patient has had home health therapy and was in inpatient rehab about a month. Patient reports he was mainly sitting down, with occasional walking, last home health therapy in May. Patient reports some standing up but no exercises. Patient lives alone in the house and is scared they might fall. Patient received prosthetic leg Tuesday of last week.    Limitations  Standing;Walking;House hold activities;Other (comment)    How long can you stand comfortably?  10-15 minutes    How long can you walk comfortably?  10-15 minutes    Patient Stated Goals  going up steps and driving car.     Currently in Pain?  Yes    Pain Score  2     Pain Location  Hip    Pain Orientation  Right;Posterior    Pain Descriptors / Indicators  Aching    Pain Type  Acute pain       127/64 pulse 57    Patient requires continued education on utilizing different ply  sock for most adherent fit. Patient did not demonstrate carryover from previous education about changing ply with changing shape of leg.   Patient weighted: 279.9lb= 273  Ambulate 120 ft with CGA , utilized different ply sock to determine best fit    Ambulate across stable and unstable surface outside. Negotiating changing surfaces across sidewalk, across brick with turns and obstacles in pathway without LOB. utilizing RW and CGA with w/c follow. Cues for widening BOS and decrease UE reliance.   SPT from w/c to mat Mod I x 2  Prone: Hip flexor stretch 3 minutes each side Hip extensions 10x each leg ; 2 sets Hamstring curls 10x each leg, 2 sets                      PT Education - 10/16/17 1208    Education Details  prone hip  flexor stretch, ambulating across stable and unstable surfaces.     Person(s) Educated  Patient    Methods  Explanation;Demonstration;Verbal cues    Comprehension  Verbalized understanding;Returned demonstration       PT Short Term Goals - 09/26/17 1054      PT SHORT TERM GOAL #1   Title  Patient will be independent in home exercise program to improve strength/mobility for better functional independence with ADLs.    Baseline  HEP given     Time  2    Period  Weeks    Status  Partially Met      PT SHORT TERM GOAL #2   Title  Patient will don/doff prosthesis independently to allow for increased mobility in home.     Baseline  8/14: requires PT to guide/direct through process 8/28: independent    Time  2    Period  Weeks    Status  Achieved      PT SHORT TERM GOAL #3   Title  Patient will ambulate 10 meters with least assistive device and prosthesis to improve mobility in home.     Baseline  8/14: not walking outside of // bars yet  8/28: 63 seconds with RW and CGA     Time  2    Period  Weeks    Status  Achieved        PT Long Term Goals - 09/26/17 1054      PT LONG TERM GOAL #1   Title  Patient will ambulate 60 ft with least assistive AD and prosthesis to improve mobility around home and increase independence.     Baseline  8/14: requires use of // bars 8/28: 30 ft with RW and CGA 9/17: 74 ft x2 with CGA and RW     Time  4    Period  Weeks    Status  Achieved      PT LONG TERM GOAL #2   Title  Patient (> 69 years old) will complete five times sit to stand test in < 15 seconds indicating an increased LE strength and improved balance.    Baseline  8/14: 61 seconds 8/28; 42 seconds 9/17: 24 seconds BUE support with LLE    Time  4    Period  Weeks    Status  Partially Met    Target Date  10/24/17      PT LONG TERM GOAL #3   Title  Patient will reduce timed up and go to <11 seconds to reduce fall risk and demonstrate improved transfer/gait ability.    Baseline  8/14:  unable to perform 8/28: 1 min 30 seconds 9/17: 38 seconds RW     Time  4    Period  Weeks    Status  Partially Met    Target Date  10/24/17      PT LONG TERM GOAL #4   Title  Patient will increase lower extremity functional scale to >60/80 to demonstrate improved functional mobility and increased tolerance with ADLs.     Baseline  814: 29/80 8/28: 20/80    Time  4    Period  Weeks    Status  Partially Met    Target Date  10/24/17      PT LONG TERM GOAL #5   Title  Patient will increase BLE gross strength to 4+/5 as to improve functional strength for independent gait, increased standing tolerance and increased ADL ability.    Baseline  8/14: 4-/15 8/28: 4-/5  9/17: L 4-/5 R 4/5     Time  4    Period  Weeks    Status  Achieved      PT LONG TERM GOAL #6   Title  Patient will perform 10 MWT in >.5 m/s for improved mobility and safety with negotiating natural environment    Baseline  8/28: .16 m/s with RW  9/17: .53 m/s     Time  4    Period  Weeks    Status  Achieved      PT LONG TERM GOAL #7   Title  Patient will perform 10 MWT in >1.0 m/s for improved mobility and safety with negotiating natural environment    Baseline  9/17: .53 m/s     Time  4    Period  Weeks    Status  New    Target Date  10/24/17      PT LONG TERM GOAL #8   Title  Patient will ambulate 300 ft with least assistive AD and prosthesis to improve mobility around home and increase independence.     Baseline  9/17: 74 ft CGA with RW     Time  4    Period  Weeks    Status  New    Target Date  10/24/17      PT LONG TERM GOAL  #9   TITLE  Patient will ascend/descend 4 stairs with rail assist independently without loss of balance to improve ability to get in/out of home.     Baseline  9/17: unable to negotiate stairs     Time  4    Period  Weeks    Status  New    Target Date  10/24/17            Plan - 10/16/17 1255    Clinical Impression Statement  Patient ambulated outside with improved velocity  of momentum, however ambulation was terminated due to pain in residual knee. Patient requires frequent cueing for widening BOS. Patient re-educated on utilizing different ply socks for different fit. Patient will benefit from skilled physical therapy to increase functional mobility, improve prosthetic use, and increase ambulation for return to PLOF.     Rehab Potential  Fair    Clinical Impairments Affecting Rehab Potential  (+) previous independence, motivation to return to walking (-) lives alone, hx of diabetes, limited vision     PT Frequency  2x / week    PT Duration  4 weeks    PT Treatment/Interventions  ADLs/Self Care Home Management;Cryotherapy;Electrical Stimulation;Ultrasound;Traction;Moist Heat;DME Instruction;Gait training;Stair training;Functional mobility training;Therapeutic activities;Therapeutic  exercise;Patient/family education;Neuromuscular re-education;Balance training;Prosthetic Training;Wheelchair mobility training;Manual techniques;Manual lymph drainage;Compression bandaging;Taping;Energy conservation;Passive range of motion    PT Next Visit Plan  steps in // bars     PT Home Exercise Plan  see sheet    Consulted and Agree with Plan of Care  Patient       Patient will benefit from skilled therapeutic intervention in order to improve the following deficits and impairments:  Abnormal gait, Decreased activity tolerance, Decreased balance, Decreased knowledge of precautions, Decreased endurance, Decreased knowledge of use of DME, Decreased mobility, Decreased range of motion, Difficulty walking, Decreased strength, Increased edema, Impaired flexibility, Impaired perceived functional ability, Prosthetic Dependency, Postural dysfunction, Improper body mechanics, Pain  Visit Diagnosis: Other abnormalities of gait and mobility  Muscle weakness (generalized)  Unsteadiness on feet     Problem List Patient Active Problem List   Diagnosis Date Noted  . Abnormality of gait  10/12/2017  . Poorly controlled type 2 diabetes mellitus with peripheral neuropathy ([place])   . Flatulence   . Hypomagnesemia   . Unilateral complete BKA, right, sequela ([place])   . Benign essential HTN   . Hypoalbuminemia due to protein-calorie malnutrition ([place])   . S/P below knee amputation, right ([place]) 04/12/2017  . Acute blood loss anemia   . Other encephalopathy 04/08/2017  . Encephalopathy 04/08/2017  . Altered mental status 04/08/2017  . Labile blood pressure   . Labile blood glucose   . Drug induced constipation   . Stage 3 chronic kidney disease ([place])   . Bacteremia   . S/P unilateral BKA (below knee amputation), right ([place]) 04/04/2017  . PAD (peripheral artery disease) ([place])   . Type 2 diabetes mellitus with right diabetic foot ulcer ([place])   . Post-operative pain   . Legally blind   . Upper GI bleed   . Streptococcal bacteremia 04/01/2017  . Hypokalemia 03/30/2017  . Uncontrolled type 2 diabetes mellitus with hyperglycemia, with long-term current use of insulin ([place]) 03/30/2017  . Type 2 diabetes mellitus with peripheral neuropathy ([place]) 03/30/2017  . AKI (acute kidney injury) ([place]) 03/30/2017  . CKD stage 3 due to type 2 diabetes mellitus ([place]) 03/30/2017  . Sepsis ([place]) 03/30/2017  . Heart murmur 11/22/2016  . Hyperlipidemia associated with type 2 diabetes mellitus ([place]) 11/22/2016  . Obstructive sleep apnea syndrome 11/22/2016  . Essential hypertension 12/17/2012   Janna Arch, PT, DPT   10/16/2017, 12:56 PM  [place] MAIN [facility] SERVICES [address] [place], [place], [address] Phone: [phone]   Fax:  [phone]  Name: Trevor [name] MRN: [id number] Date of Birth: [birth year]

## 2017-10-17 ENCOUNTER — Telehealth: Payer: Self-pay

## 2017-10-17 NOTE — Telephone Encounter (Signed)
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 I think we should repeat colonoscopy in 6 months to ensure everything is ok   Please schedule and inform pcp of my message,.

## 2017-10-17 NOTE — Telephone Encounter (Signed)
Spoke with pt and informed him of colonoscopy biopsy results and Dr. Georgeann Oppenheim instructions to repeat colonoscopy in 6 months.

## 2017-10-19 ENCOUNTER — Ambulatory Visit: Payer: Medicaid Other

## 2017-10-19 DIAGNOSIS — R2681 Unsteadiness on feet: Secondary | ICD-10-CM

## 2017-10-19 DIAGNOSIS — M6281 Muscle weakness (generalized): Secondary | ICD-10-CM

## 2017-10-19 DIAGNOSIS — R2689 Other abnormalities of gait and mobility: Secondary | ICD-10-CM

## 2017-10-19 NOTE — Therapy (Signed)
Trevor [name] [facility] SERVICES [address] [place], [place], [address] Phone: [phone]   Fax:  [phone]  Physical Therapy Treatment Physical Therapy Progress Note   Dates of reporting period  09/26/17  to 10/19/17  Patient Details  Name: Trevor [name] MRN: [id number] Date of Birth: 01/10/1956 Referring Provider (PT): Jamse Arn, DR   Encounter Date: 10/19/2017  PT End of Session - 10/19/17 1026    Visit Number  11    Number of Visits  19    Date for PT Re-Evaluation  11/15/17    Authorization Type   PN 1/10 start 10/10     PT Start Time  0932    PT Stop Time  1016    PT Time Calculation (min)  44 min    Equipment Utilized During Treatment  Gait belt   R prosthesis   Activity Tolerance  Patient tolerated treatment well;Other (comment)    Behavior During Therapy  WFL for tasks assessed/performed       Past Medical History:  Diagnosis Date  . Diabetes mellitus without complication ([place])   . Heart murmur   . Hyperlipidemia   . Hypertension   . Sleep apnea     Past Surgical History:  Procedure Laterality Date  . AMPUTATION Right 03/31/2017   Procedure: RIGHT FOOT 1ST AND 2ND RAY AMPUTATION;  Surgeon: Newt Minion, MD;  Location: [place];  Service: Orthopedics;  Laterality: Right;  . AMPUTATION Right 04/01/2017   Procedure: AMPUTATION BELOW KNEE;  Surgeon: Newt Minion, MD;  Location: [place];  Service: Orthopedics;  Laterality: Right;  . COLONOSCOPY WITH PROPOFOL N/A 10/02/2017   Procedure: COLONOSCOPY WITH PROPOFOL;  Surgeon: [name], MD;  Location: [facility] ENDOSCOPY;  Service: Gastroenterology;  Laterality: N/A;  . CORNEAL TRANSPLANT      There were no vitals filed for this visit.  Subjective Assessment - 10/19/17 0937    Subjective  Patient fasted for [employer] yesterday. Will leave for trip next week, has been compliant with HEP. No pain or falls since last session. Patient saw prosthetist two days ago and had  some adjustments made, also was given 5 ply sock.     Pertinent History  62 year old right-handed male with history of diabetes mellitus, hypertension, legally blind, and CKD presenting for BKA 04/01/17. Patient wears contacts and can see better now.  Patient had two surgeries, one was a couple toes then they decided to go below the knee. 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 Has ambulated with RW in hospital with PT. Patient has had home health therapy and was in inpatient rehab about a month. Patient reports he was mainly sitting down, with occasional walking, last home health therapy in May. Patient reports some standing up but no exercises. Patient lives alone in the house and is scared they might fall. Patient received prosthetic leg Tuesday of last week.    Limitations  Standing;Walking;House hold activities;Other (comment)    How long can you stand comfortably?  10-15 minutes    How long can you walk comfortably?  10-15 minutes    Patient Stated Goals  going up steps and driving car.     Currently in Pain?  No/denies     Vitals: 136/72 pulse 57   Patient wearing 3 ply sock today   Recert 5x STS: 16 seconds excessive BUE support ; unable to  stand with just one hand support .  TUG: 25 seconds LEFS: 20/80 10 MWT: 16 seconds =.635 m/s BLE strength : RLE Hip flexor 4/5 Abductor 4/5 Adductor 4-/5 knee flexor  4-/5 knee extensor 4-/5   Ascend/descend 4 stairs: performed 2x: Ascend leading with LLE, step to pattern with BUE support. Descend with RLE leading step to pattern BUE support. CGA   Ambulate with single HHA in // bars 4x length of bars CGA, no LOB. Large step pattern with widened BOS   Ambulate with cane in // bars: Cane in LUE with 2 point pattern of ambulation (cane and R foot strike at same time) larger BOS with greater amplitude of step length.   Ambulate with cane outside of // bars: Ambulate with cane in  LUE with 2 point pattern of ambulation x 30 ft trial one and  70 ft second trial.  CGA with WC follow.  greater amplitude of step length bilaterally.    Patient's condition has the potential to improve in response to therapy. Maximum improvement is yet to be obtained. The anticipated improvement is attainable and reasonable in a generally predictable time.  Patient reports he is feeling more comfortable wearing and walking with his prosthesis. Feels like he can get more upright.                         PT Education - 10/19/17 0931    Education Details  goals, exercise technique, mobility     Person(s) Educated  Patient    Methods  Explanation;Demonstration;Verbal cues    Comprehension  Verbalized understanding;Returned demonstration       PT Short Term Goals - 10/19/17 1042      PT SHORT TERM GOAL #1   Title  Patient will be independent in home exercise program to improve strength/mobility for better functional independence with ADLs.    Baseline  HEP given     Time  2    Period  Weeks    Status  Partially Met      PT SHORT TERM GOAL #2   Title  Patient will don/doff prosthesis independently to allow for increased mobility in home.     Baseline  8/14: requires PT to guide/direct through process 8/28: independent    Time  2    Period  Weeks    Status  Achieved      PT SHORT TERM GOAL #3   Title  Patient will ambulate 10 meters with least assistive device and prosthesis to improve mobility in home.     Baseline  8/14: not walking outside of // bars yet  8/28: 63 seconds with RW and CGA     Time  2    Period  Weeks    Status  Achieved      PT SHORT TERM GOAL #4   Title  Patient will perform STS with single UE support to decrease reliance upon UE's for stability     Baseline  patient requires BUE support    Time  2    Period  Weeks    Status  New    Target Date  11/02/17        PT Long Term Goals - 10/19/17 0945      PT LONG TERM GOAL #1   Title   Patient will ambulate 60 ft with least assistive AD and prosthesis to improve mobility around home and increase independence.     Baseline  8/14: requires  use of // bars 8/28: 30 ft with RW and CGA 9/17: 74 ft x2 with CGA and RW     Time  4    Period  Weeks    Status  Achieved      PT LONG TERM GOAL #2   Title  Patient (> 37 years old) will complete five times sit to stand test in < 15 seconds indicating an increased LE strength and improved balance.    Baseline  8/14: 61 seconds 8/28; 42 seconds 9/17: 24 seconds BUE support with LLE 10/10: 17 seconds with excessive BUE support     Time  4    Period  Weeks    Status  Partially Met      PT LONG TERM GOAL #3   Title  Patient will reduce timed up and go to <11 seconds to reduce fall risk and demonstrate improved transfer/gait ability.    Baseline  8/14: unable to perform 8/28: 1 min 30 seconds 9/17: 38 seconds RW 10/10: 25 seconds with RW     Time  4    Period  Weeks    Status  Partially Met    Target Date  11/15/17      PT LONG TERM GOAL #4   Title  Patient will increase lower extremity functional scale to >60/80 to demonstrate improved functional mobility and increased tolerance with ADLs.     Baseline  814: 29/80 8/28: 20/80 10/10: 20/80    Time  4    Period  Weeks    Status  Partially Met    Target Date  11/15/17      PT LONG TERM GOAL #5   Title  Patient will increase BLE gross strength to 4+/5 as to improve functional strength for independent gait, increased standing tolerance and increased ADL ability.    Baseline  8/14: 4-/15 8/28: 4-/5  9/17: L 4-/5 R 4/5      Time  4    Period  Weeks    Status  Achieved      PT LONG TERM GOAL #6   Title  Patient will perform 10 MWT in >.5 m/s for improved mobility and safety with negotiating natural environment    Baseline  8/28: .16 m/s with RW  9/17: .53 m/s     Time  4    Period  Weeks    Status  Achieved      PT LONG TERM GOAL #7   Title  Patient will perform 10 MWT in >1.0 m/s  for improved mobility and safety with negotiating natural environment    Baseline  9/17: .53 m/s 10/10: .63 m/s with RW     Time  4    Period  Weeks    Status  Partially Met    Target Date  11/15/17      PT LONG TERM GOAL #8   Title  Patient will ambulate 300 ft with least assistive AD and prosthesis to improve mobility around home and increase independence.     Baseline  9/17: 74 ft CGA with RW 10/10 ambulated 280 ft with RW, 70 ft with SPC     Time  4    Period  Weeks    Status  Partially Met    Target Date  11/15/17      PT LONG TERM GOAL  #9   TITLE  Patient will ascend/descend 4 stairs with rail assist independently without loss of balance to improve ability to get in/out  of home.     Baseline  9/17: unable to negotiate stairs 10/10: ascend with step to pattern, BUE support, and CGA    Time  4    Period  Weeks    Status  Partially Met    Target Date  11/15/17            Plan - 10/19/17 1042    Clinical Impression Statement  Patient progressing towards goals at this time, 10 MWT improved to .63 m/s with RW, 5x STS 16 seconds with excessive UE support, and TUG performed in 25 seconds with RW. Patient is unable to perform sit to stand with single UE support at this time demonstrating high reliance upon UE's for transitions. Patient introduced to ambulation with a cane today to decrease reliance upon UE's for mobility. Patient demonstrated greater step length and improved base of support. Patient's condition has the potential to improve in response to therapy. Maximum improvement is yet to be obtained. The anticipated improvement is attainable and reasonable in a generally predictable time.   Patient will benefit from skilled physical therapy to increase functional mobility, improve prosthetic use, and increase ambulation for return to PLOF.     Rehab Potential  Fair    Clinical Impairments Affecting Rehab Potential  (+) previous independence, motivation to return to walking (-)  lives alone, hx of diabetes, limited vision     PT Frequency  2x / week    PT Duration  4 weeks    PT Treatment/Interventions  ADLs/Self Care Home Management;Cryotherapy;Electrical Stimulation;Ultrasound;Traction;Moist Heat;DME Instruction;Gait training;Stair training;Functional mobility training;Therapeutic activities;Therapeutic exercise;Patient/family education;Neuromuscular re-education;Balance training;Prosthetic Training;Wheelchair mobility training;Manual techniques;Manual lymph drainage;Compression bandaging;Taping;Energy conservation;Passive range of motion    PT Next Visit Plan  steps in // bars     PT Home Exercise Plan  see sheet    Consulted and Agree with Plan of Care  Patient       Patient will benefit from skilled therapeutic intervention in order to improve the following deficits and impairments:  Abnormal gait, Decreased activity tolerance, Decreased balance, Decreased knowledge of precautions, Decreased endurance, Decreased knowledge of use of DME, Decreased mobility, Decreased range of motion, Difficulty walking, Decreased strength, Increased edema, Impaired flexibility, Impaired perceived functional ability, Prosthetic Dependency, Postural dysfunction, Improper body mechanics, Pain  Visit Diagnosis: Other abnormalities of gait and mobility  Muscle weakness (generalized)  Unsteadiness on feet     Problem List Patient Active Problem List   Diagnosis Date Noted  . Abnormality of gait 10/12/2017  . Poorly controlled type 2 diabetes mellitus with peripheral neuropathy ([place])   . Flatulence   . Hypomagnesemia   . Unilateral complete BKA, right, sequela ([place])   . Benign essential HTN   . Hypoalbuminemia due to protein-calorie malnutrition ([place])   . S/P below knee amputation, right ([place]) 04/12/2017  . Acute blood loss anemia   . Other encephalopathy 04/08/2017  . Encephalopathy 04/08/2017  . Altered mental status 04/08/2017  . Labile blood pressure   . Labile blood  glucose   . Drug induced constipation   . Stage 3 chronic kidney disease ([place])   . Bacteremia   . S/P unilateral BKA (below knee amputation), right ([place]) 04/04/2017  . PAD (peripheral artery disease) ([place])   . Type 2 diabetes mellitus with right diabetic foot ulcer ([place])   . Post-operative pain   . Legally blind   . Upper GI bleed   . Streptococcal bacteremia 04/01/2017  . Hypokalemia 03/30/2017  . Uncontrolled type 2 diabetes mellitus  with hyperglycemia, with long-term current use of insulin ([place]) 03/30/2017  . Type 2 diabetes mellitus with peripheral neuropathy ([place]) 03/30/2017  . AKI (acute kidney injury) ([place]) 03/30/2017  . CKD stage 3 due to type 2 diabetes mellitus ([place]) 03/30/2017  . Sepsis ([place]) 03/30/2017  . Heart murmur 11/22/2016  . Hyperlipidemia associated with type 2 diabetes mellitus ([place]) 11/22/2016  . Obstructive sleep apnea syndrome 11/22/2016  . Essential hypertension 12/17/2012   Janna Arch, PT, DPT   10/19/2017, 10:47 AM  [place] [name] [facility] SERVICES [address] [place], [place], [address] Phone: [phone]   Fax:  [phone]  Name: RANVIR RENOVATO MRN: [id number] Date of Birth: [birth year]

## 2017-11-01 ENCOUNTER — Ambulatory Visit: Payer: Medicaid Other

## 2017-11-01 DIAGNOSIS — R2681 Unsteadiness on feet: Secondary | ICD-10-CM

## 2017-11-01 DIAGNOSIS — R2689 Other abnormalities of gait and mobility: Secondary | ICD-10-CM

## 2017-11-01 DIAGNOSIS — M6281 Muscle weakness (generalized): Secondary | ICD-10-CM

## 2017-11-01 DIAGNOSIS — S88111S Complete traumatic amputation at level between knee and ankle, right lower leg, sequela: Secondary | ICD-10-CM

## 2017-11-01 NOTE — Therapy (Addendum)
[facility] SERVICES [address] [place], [place], [address] Phone: [phone]   Fax:  [phone]  Physical Therapy Treatment  Patient Details  Name: Trevor [name] MRN: [id number] Date of Birth: 1955-02-21 Referring Provider (PT): Jamse Arn, DR   Encounter Date: 11/01/2017  PT End of Session - 11/01/17 1027    Visit Number  12    Number of Visits  19    Date for PT Re-Evaluation  11/15/17    Authorization Type   PN 2/10 start 10/10     PT Start Time  0935    PT Stop Time  1020    PT Time Calculation (min)  45 min    Equipment Utilized During Treatment  Gait belt   R prosthesis   Activity Tolerance  Patient tolerated treatment well;Other (comment)    Behavior During Therapy  WFL for tasks assessed/performed       Past Medical History:  Diagnosis Date  . Diabetes mellitus without complication ([place])   . Heart murmur   . Hyperlipidemia   . Hypertension   . Sleep apnea     Past Surgical History:  Procedure Laterality Date  . AMPUTATION Right 03/31/2017   Procedure: RIGHT FOOT 1ST AND 2ND RAY AMPUTATION;  Surgeon: Newt Minion, MD;  Location: [place];  Service: Orthopedics;  Laterality: Right;  . AMPUTATION Right 04/01/2017   Procedure: AMPUTATION BELOW KNEE;  Surgeon: Newt Minion, MD;  Location: [facility];  Service: Orthopedics;  Laterality: Right;  . COLONOSCOPY WITH PROPOFOL N/A 10/02/2017   Procedure: COLONOSCOPY WITH PROPOFOL;  Surgeon: [name], MD;  Location: [facility] ENDOSCOPY;  Service: Gastroenterology;  Laterality: N/A;  . CORNEAL TRANSPLANT      There were no vitals filed for this visit.  Subjective Assessment - 11/01/17 0939    Subjective  Patient states he had a great vacation. Reports he did walk while at the beach. States one day he walked so much he had pain below R knee. States he has also had recent difficulty getting prosthetic on. No falls since last visit.     Pertinent History  62 year old  right-handed male with history of diabetes mellitus, hypertension, legally blind, and CKD presenting for BKA 04/01/17. Patient wears contacts and can see better now.  Patient had two surgeries, one was a couple toes then they decided to go below the knee. 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 Has ambulated with RW in hospital with PT. Patient has had home health therapy and was in inpatient rehab about a month. Patient reports he was mainly sitting down, with occasional walking, last home health therapy in May. Patient reports some standing up but no exercises. Patient lives alone in the house and is scared they might fall. Patient received prosthetic leg Tuesday of last week.    Limitations  Standing;Walking;House hold activities;Other (comment)    How long can you stand comfortably?  10-15 minutes    How long can you walk comfortably?  10-15 minutes    Patient Stated Goals  going up steps and driving car.     Currently in Pain?  No/denies         Vitals: BP: 166/91mHg HR: 62bpm   Patient is not wearing ply sock today     Ambulate with cane in hallway. Cane in LUE with 2 point pattern of ambulation (cane and  R foot strike at same time) larger BOS with greater amplitude of step length. CGA with WC follow approximately 310f. 2 seated rest breaks (1 for increased discomfort, 1 for fatigue). Verbal cues for upright posture and neutral alignment of LLE. Cued to decrease gait speed for stability.   6" step in // bars 10x. Verbal cues to go up with LLE and down with prosthetic leg. CGA  Standing abduction 10x each leg in // bars. Verbal cues for upright posture and for LE foot alignment. BUE CGA  Standing hip extension x10 each leg in //bars. Verbal cues for upright posture. BUE CGA    Standing marches. 1st x10 each leg. 2nd set on either side of 2x4 plank 10x each leg with LUE support only in front of mirror. Verbal cues  to decrease speed of movement.    Doffing of prosthetic at end of session                      PT Education - 11/01/17 1027    Education Details  exercise technique, mobility, swelling, phantom sensation     Person(s) Educated  Patient    Methods  Explanation;Demonstration;Verbal cues    Comprehension  Verbalized understanding;Returned demonstration       PT Short Term Goals - 10/19/17 1042      PT SHORT TERM GOAL #1   Title  Patient will be independent in home exercise program to improve strength/mobility for better functional independence with ADLs.    Baseline  HEP given     Time  2    Period  Weeks    Status  Partially Met      PT SHORT TERM GOAL #2   Title  Patient will don/doff prosthesis independently to allow for increased mobility in home.     Baseline  8/14: requires PT to guide/direct through process 8/28: independent    Time  2    Period  Weeks    Status  Achieved      PT SHORT TERM GOAL #3   Title  Patient will ambulate 10 meters with least assistive device and prosthesis to improve mobility in home.     Baseline  8/14: not walking outside of // bars yet  8/28: 63 seconds with RW and CGA     Time  2    Period  Weeks    Status  Achieved      PT SHORT TERM GOAL #4   Title  Patient will perform STS with single UE support to decrease reliance upon UE's for stability     Baseline  patient requires BUE support    Time  2    Period  Weeks    Status  New    Target Date  11/02/17        PT Long Term Goals - 10/19/17 0945      PT LONG TERM GOAL #1   Title  Patient will ambulate 60 ft with least assistive AD and prosthesis to improve mobility around home and increase independence.     Baseline  8/14: requires use of // bars 8/28: 30 ft with RW and CGA 9/17: 74 ft x2 with CGA and RW     Time  4    Period  Weeks    Status  Achieved      PT LONG TERM GOAL #2   Title  Patient (> 681years old) will complete five times sit to stand test in <  15  seconds indicating an increased LE strength and improved balance.    Baseline  8/14: 61 seconds 8/28; 42 seconds 9/17: 24 seconds BUE support with LLE 10/10: 17 seconds with excessive BUE support     Time  4    Period  Weeks    Status  Partially Met      PT LONG TERM GOAL #3   Title  Patient will reduce timed up and go to <11 seconds to reduce fall risk and demonstrate improved transfer/gait ability.    Baseline  8/14: unable to perform 8/28: 1 min 30 seconds 9/17: 38 seconds RW 10/10: 25 seconds with RW     Time  4    Period  Weeks    Status  Partially Met    Target Date  11/15/17      PT LONG TERM GOAL #4   Title  Patient will increase lower extremity functional scale to >60/80 to demonstrate improved functional mobility and increased tolerance with ADLs.     Baseline  814: 29/80 8/28: 20/80 10/10: 20/80    Time  4    Period  Weeks    Status  Partially Met    Target Date  11/15/17      PT LONG TERM GOAL #5   Title  Patient will increase BLE gross strength to 4+/5 as to improve functional strength for independent gait, increased standing tolerance and increased ADL ability.    Baseline  8/14: 4-/15 8/28: 4-/5  9/17: L 4-/5 R 4/5      Time  4    Period  Weeks    Status  Achieved      PT LONG TERM GOAL #6   Title  Patient will perform 10 MWT in >.5 m/s for improved mobility and safety with negotiating natural environment    Baseline  8/28: .16 m/s with RW  9/17: .53 m/s     Time  4    Period  Weeks    Status  Achieved      PT LONG TERM GOAL #7   Title  Patient will perform 10 MWT in >1.0 m/s for improved mobility and safety with negotiating natural environment    Baseline  9/17: .53 m/s 10/10: .63 m/s with RW     Time  4    Period  Weeks    Status  Partially Met    Target Date  11/15/17      PT LONG TERM GOAL #8   Title  Patient will ambulate 300 ft with least assistive AD and prosthesis to improve mobility around home and increase independence.     Baseline  9/17: 74 ft  CGA with RW 10/10 ambulated 280 ft with RW, 70 ft with SPC     Time  4    Period  Weeks    Status  Partially Met    Target Date  11/15/17      PT LONG TERM GOAL  #9   TITLE  Patient will ascend/descend 4 stairs with rail assist independently without loss of balance to improve ability to get in/out of home.     Baseline  9/17: unable to negotiate stairs 10/10: ascend with step to pattern, BUE support, and CGA    Time  4    Period  Weeks    Status  Partially Met    Target Date  11/15/17            Plan - 11/01/17 1125  Clinical Impression Statement  Patient ambulated in hallway with [place] with step through gait pattern and increased gait speed than with RW. However patient still has decreased stance time on RLE with decreased glute activation. Patient requires frequent cueing for upright posture and decrease in gait speed due to instability. Patient will continue to benefit from skilled physical therapy to increase functional mobility, improve prosthetic use, and increase ambulation to return to PLOF.    Rehab Potential  Fair    Clinical Impairments Affecting Rehab Potential  (+) previous independence, motivation to return to walking (-) lives alone, hx of diabetes, limited vision     PT Frequency  2x / week    PT Duration  4 weeks    PT Treatment/Interventions  ADLs/Self Care Home Management;Cryotherapy;Electrical Stimulation;Ultrasound;Traction;Moist Heat;DME Instruction;Gait training;Stair training;Functional mobility training;Therapeutic activities;Therapeutic exercise;Patient/family education;Neuromuscular re-education;Balance training;Prosthetic Training;Wheelchair mobility training;Manual techniques;Manual lymph drainage;Compression bandaging;Taping;Energy conservation;Passive range of motion    PT Next Visit Plan  steps in // bars     PT Home Exercise Plan  see sheet    Consulted and Agree with Plan of Care  Patient       Patient will benefit from skilled therapeutic  intervention in order to improve the following deficits and impairments:  Abnormal gait, Decreased activity tolerance, Decreased balance, Decreased knowledge of precautions, Decreased endurance, Decreased knowledge of use of DME, Decreased mobility, Decreased range of motion, Difficulty walking, Decreased strength, Increased edema, Impaired flexibility, Impaired perceived functional ability, Prosthetic Dependency, Postural dysfunction, Improper body mechanics, Pain  Visit Diagnosis: Other abnormalities of gait and mobility  Muscle weakness (generalized)  Unsteadiness on feet  Unilateral complete BKA, right, sequela ([place])     Problem List Patient Active Problem List   Diagnosis Date Noted  . Abnormality of gait 10/12/2017  . Poorly controlled type 2 diabetes mellitus with peripheral neuropathy ([place])   . Flatulence   . Hypomagnesemia   . Unilateral complete BKA, right, sequela ([place])   . Benign essential HTN   . Hypoalbuminemia due to protein-calorie malnutrition ([name])   . S/P below knee amputation, right ([place]) 04/12/2017  . Acute blood loss anemia   . Other encephalopathy 04/08/2017  . Encephalopathy 04/08/2017  . Altered mental status 04/08/2017  . Labile blood pressure   . Labile blood glucose   . Drug induced constipation   . Stage 3 chronic kidney disease ([place])   . Bacteremia   . S/P unilateral BKA (below knee amputation), right ([place]) 04/04/2017  . PAD (peripheral artery disease) ([place])   . Type 2 diabetes mellitus with right diabetic foot ulcer ([place])   . Post-operative pain   . Legally blind   . Upper GI bleed   . Streptococcal bacteremia 04/01/2017  . Hypokalemia 03/30/2017  . Uncontrolled type 2 diabetes mellitus with hyperglycemia, with long-term current use of insulin ([place]) 03/30/2017  . Type 2 diabetes mellitus with peripheral neuropathy ([place]) 03/30/2017  . AKI (acute kidney injury) ([place]) 03/30/2017  . CKD stage 3 due to type 2 diabetes mellitus ([place]) 03/30/2017   . Sepsis ([place]) 03/30/2017  . Heart murmur 11/22/2016  . Hyperlipidemia associated with type 2 diabetes mellitus ([place]) 11/22/2016  . Obstructive sleep apnea syndrome 11/22/2016  . Essential hypertension 12/17/2012   Erick Blinks, SPT  This entire session was performed under direct supervision and direction of a licensed therapist/therapist assistant . I have personally read, edited and approve of the note as written.  Janna Arch, PT, DPT   11/01/2017, 12:55 PM  Tat Momoli  [place] [facility] SERVICES [place], [place], [address] Phone: [phone]   Fax:  [phone]  Name: MICAJAH DENNIN MRN: [id number] Date of Birth: [birth year]

## 2017-11-02 ENCOUNTER — Encounter: Payer: Self-pay | Admitting: Podiatry

## 2017-11-02 ENCOUNTER — Ambulatory Visit: Payer: Medicaid Other | Admitting: Podiatry

## 2017-11-02 DIAGNOSIS — S88119D Complete traumatic amputation at level between knee and ankle, unspecified lower leg, subsequent encounter: Secondary | ICD-10-CM | POA: Diagnosis not present

## 2017-11-02 DIAGNOSIS — B351 Tinea unguium: Secondary | ICD-10-CM | POA: Diagnosis not present

## 2017-11-02 DIAGNOSIS — M79675 Pain in left toe(s): Secondary | ICD-10-CM

## 2017-11-02 DIAGNOSIS — E1142 Type 2 diabetes mellitus with diabetic polyneuropathy: Secondary | ICD-10-CM

## 2017-11-02 NOTE — Progress Notes (Signed)
Complaint:  Visit Type: Patient returns to my office for continued preventative foot care services. Complaint: Patient states" my nails have grown long and thick and become painful to walk and wear shoes" Patient has been diagnosed with DM with neuropathy left foot.  Patient has amputation right foot.. The patient presents for preventative foot care services. No changes to ROS.  Patient has diabetic neuropathy and angiopathy.  Podiatric Exam: Vascular: dorsalis pedis and posterior tibial pulses are not  palpable bilateral. Capillary return is immediate. Temperature gradient is WNL. Skin turgor WNL  Sensorium: Diminished  Semmes Weinstein monofilament test. Normal tactile sensation bilaterally. Nail Exam: Pt has thick disfigured discolored nails with subungual debris noted left  entire nail hallux through fifth toenails Ulcer Exam: There is no evidence of ulcer or pre-ulcerative changes or infection. Orthopedic Exam: Muscle tone and strength are WNL. No limitations in general ROM. No crepitus or effusions noted. Foot type and digits show no abnormalities. Bony prominences are unremarkable. Skin: No Porokeratosis. No infection or ulcers  Diagnosis:  Onychomycosis, , Pain in right toe, pain in left toes  Treatment & Plan Procedures and Treatment: Consent by patient was obtained for treatment procedures.   Debridement of mycotic and hypertrophic toenails, 1 through 5 bilateral and clearing of subungual debris. No ulceration, no infection noted.  Return Visit-Office Procedure: Patient instructed to return to the office for a follow up visit 3 months for continued evaluation and treatment.    Gardiner Barefoot DPM

## 2017-11-07 ENCOUNTER — Ambulatory Visit: Payer: Medicaid Other

## 2017-11-07 VITALS — BP 148/85 | HR 58

## 2017-11-07 DIAGNOSIS — M6281 Muscle weakness (generalized): Secondary | ICD-10-CM

## 2017-11-07 DIAGNOSIS — R2689 Other abnormalities of gait and mobility: Secondary | ICD-10-CM

## 2017-11-07 DIAGNOSIS — R2681 Unsteadiness on feet: Secondary | ICD-10-CM

## 2017-11-07 NOTE — Therapy (Signed)
[place] MAIN [facility] SERVICES [address] [place], [place], [address] Phone: [phone]   Fax:  [phone]  Physical Therapy Treatment  Patient Details  Name: Trevor [name] MRN: [id number] Date of Birth: 29-Nov-1955 Referring Provider ([name]): Jamse Arn, DR   Encounter Date: 11/07/2017  [name] End of Session - 11/07/17 1343    Visit Number  13    Number of Visits  19    Date for [name] Re-Evaluation  11/15/17    Authorization Type   PN 3/10 start 10/10     [name] Start Time  1345    [name] Stop Time  1430    [name] Time Calculation (min)  45 min    Equipment Utilized During Treatment  Gait belt   R prosthesis   Activity Tolerance  Patient tolerated treatment well;Other (comment)    Behavior During Therapy  WFL for tasks assessed/performed       Past Medical History:  Diagnosis Date  . Diabetes mellitus without complication ([place])   . Heart murmur   . Hyperlipidemia   . Hypertension   . Sleep apnea     Past Surgical History:  Procedure Laterality Date  . AMPUTATION Right 03/31/2017   Procedure: RIGHT FOOT 1ST AND 2ND RAY AMPUTATION;  Surgeon: Newt Minion, MD;  Location: [place];  Service: Orthopedics;  Laterality: Right;  . AMPUTATION Right 04/01/2017   Procedure: AMPUTATION BELOW KNEE;  Surgeon: Newt Minion, MD;  Location: [place];  Service: Orthopedics;  Laterality: Right;  . COLONOSCOPY WITH PROPOFOL N/A 10/02/2017   Procedure: COLONOSCOPY WITH PROPOFOL;  Surgeon: [name], MD;  Location: [facility] ENDOSCOPY;  Service: Gastroenterology;  Laterality: N/A;  . CORNEAL TRANSPLANT      Vitals:   11/07/17 1350  BP: (!) 148/85  Pulse: (!) 58  SpO2: 100%    Subjective Assessment - 11/07/17 1342    Subjective  [name] reports that he is doing well on this date. No specific questions or concerns upon arrival. He is performing his HEP.     Pertinent History  62 year old right-handed male with history of diabetes mellitus, hypertension, legally  blind, and CKD presenting for BKA 04/01/17. Patient wears contacts and can see better now.  Patient had two surgeries, one was a couple toes then they decided to go below the knee. 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 Has ambulated with RW in hospital with [name]. Patient has had home health therapy and was in inpatient rehab about a month. Patient reports he was mainly sitting down, with occasional walking, last home health therapy in May. Patient reports some standing up but no exercises. Patient lives alone in the house and is scared they might fall. Patient received prosthetic leg Tuesday of last week.    Limitations  Standing;Walking;House hold activities;Other (comment)    How long can you stand comfortably?  10-15 minutes    How long can you walk comfortably?  10-15 minutes    Patient Stated Goals  going up steps and driving car.     Currently in Pain?  No/denies           TREATMENT    Gait Training Gait training with single point cane in hallway. Cane in LUE with 2 point pattern of ambulation (cane and R foot strike at same time) larger BOS with greater amplitude of step length. Cues for increased L  step length, CGA with WC follow approximately 316f. 1 seated rest break secondary to fatigue. Added three ply sock after initial 100' due to increase in pain. Pain resolves. Verbal cues for upright posture and neutral alignment of LLE.   Ther-ex  Toe taps to 2" Airex pad without UE support alternating LE 2 x 10 on each side; Supine R SLR with prosthetic donned 2 x 10; Supine R hip abduction with manual resistance 2 x 10; Supine R single leg bridges with bolster under R knee and L knee to chest 2 x 10, [name] unable to fully clear to bed demonstrates good gluteal contraction; Doffing of prosthetic at end of session   [name] educated throughout session about proper posture and technique with exercises. Improved exercise  technique, movement at target joints, use of target muscles after min to mod verbal, visual, tactile cues.    Continued gait training with patient utilizing single point cane in LUE. [place] gait pattern. Added three ply sock after initial 100' due to increase in pain and this resolves his pain. He is able to complete supine exercises as instructed and demonstrates decreased R glut strength with attempted single leg bridges. Patient will continue to benefit from skilled physical therapy to increase functional mobility, improve prosthetic use, and increase ambulation to return to PLOF.                     [name] Short Term Goals - 10/19/17 1042      [name] SHORT TERM GOAL #1   Title  Patient will be independent in home exercise program to improve strength/mobility for better functional independence with ADLs.    Baseline  HEP given     Time  2    Period  Weeks    Status  Partially Met      [name] SHORT TERM GOAL #2   Title  Patient will don/doff prosthesis independently to allow for increased mobility in home.     Baseline  8/14: requires [name] to guide/direct through process 8/28: independent    Time  2    Period  Weeks    Status  Achieved      [name] SHORT TERM GOAL #3   Title  Patient will ambulate 10 meters with least assistive device and prosthesis to improve mobility in home.     Baseline  8/14: not walking outside of // bars yet  8/28: 63 seconds with RW and CGA     Time  2    Period  Weeks    Status  Achieved      [name] SHORT TERM GOAL #4   Title  Patient will perform STS with single UE support to decrease reliance upon UE's for stability     Baseline  patient requires BUE support    Time  2    Period  Weeks    Status  New    Target Date  11/02/17        [name] Long Term Goals - 10/19/17 0945      [name] LONG TERM GOAL #1   Title  Patient will ambulate 60 ft with least assistive AD and prosthesis to improve mobility around home and  increase independence.     Baseline  8/14: requires use of // bars 8/28: 30 ft with RW and CGA 9/17: 74 ft x2 with CGA and RW     Time  4    Period  Weeks  Status  Achieved      [name] LONG TERM GOAL #2   Title  Patient (> 62 years old) will complete five times sit to stand test in < 15 seconds indicating an increased LE strength and improved balance.    Baseline  8/14: 61 seconds 8/28; 42 seconds 9/17: 24 seconds BUE support with LLE 10/10: 17 seconds with excessive BUE support     Time  4    Period  Weeks    Status  Partially Met      [name] LONG TERM GOAL #3   Title  Patient will reduce timed up and go to <11 seconds to reduce fall risk and demonstrate improved transfer/gait ability.    Baseline  8/14: unable to perform 8/28: 1 min 30 seconds 9/17: 38 seconds RW 10/10: 25 seconds with RW     Time  4    Period  Weeks    Status  Partially Met    Target Date  11/15/17      [name] LONG TERM GOAL #4   Title  Patient will increase lower extremity functional scale to >60/80 to demonstrate improved functional mobility and increased tolerance with ADLs.     Baseline  814: 29/80 8/28: 20/80 10/10: 20/80    Time  4    Period  Weeks    Status  Partially Met    Target Date  11/15/17      [name] LONG TERM GOAL #5   Title  Patient will increase BLE gross strength to 4+/5 as to improve functional strength for independent gait, increased standing tolerance and increased ADL ability.    Baseline  8/14: 4-/15 8/28: 4-/5  9/17: L 4-/5 R 4/5      Time  4    Period  Weeks    Status  Achieved      [name] LONG TERM GOAL #6   Title  Patient will perform 10 MWT in >.5 m/s for improved mobility and safety with negotiating natural environment    Baseline  8/28: .16 m/s with RW  9/17: .53 m/s     Time  4    Period  Weeks    Status  Achieved      [name] LONG TERM GOAL #7   Title  Patient will perform 10 MWT in >1.0 m/s for improved mobility and safety with negotiating natural environment    Baseline  9/17: .53 m/s  10/10: .63 m/s with RW     Time  4    Period  Weeks    Status  Partially Met    Target Date  11/15/17      [name] LONG TERM GOAL #8   Title  Patient will ambulate 300 ft with least assistive AD and prosthesis to improve mobility around home and increase independence.     Baseline  9/17: 74 ft CGA with RW 10/10 ambulated 280 ft with RW, 70 ft with SPC     Time  4    Period  Weeks    Status  Partially Met    Target Date  11/15/17      [name] LONG TERM GOAL  #9   TITLE  Patient will ascend/descend 4 stairs with rail assist independently without loss of balance to improve ability to get in/out of home.     Baseline  9/17: unable to negotiate stairs 10/10: ascend with step to pattern, BUE support, and CGA    Time  4    Period  Weeks  Status  Partially Met    Target Date  11/15/17            Plan - 11/07/17 1345    Clinical Impression Statement  Continued gait training with patient utilizing single point cane in LUE. He ambulated in hallway with [place] with step through gait pattern. Added three ply sock after initial 100' due to increase in pain and this resolves his pain. He is able to complete supine exercises as instructed and demonstrates decreased R glut strength with attempted single leg bridges. Patient will continue to benefit from skilled physical therapy to increase functional mobility, improve prosthetic use, and increase ambulation to return to PLOF.    Rehab Potential  Fair    Clinical Impairments Affecting Rehab Potential  (+) previous independence, motivation to return to walking (-) lives alone, hx of diabetes, limited vision     [name] Frequency  2x / week    [name] Duration  4 weeks    [name] Treatment/Interventions  ADLs/Self Care Home Management;Cryotherapy;Electrical Stimulation;Ultrasound;Traction;Moist Heat;DME Instruction;Gait training;Stair training;Functional mobility training;Therapeutic activities;Therapeutic exercise;Patient/family education;Neuromuscular re-education;Balance  training;Prosthetic Training;Wheelchair mobility training;Manual techniques;Manual lymph drainage;Compression bandaging;Taping;Energy conservation;Passive range of motion    [name] Next Visit Plan  steps in // bars     [name] Home Exercise Plan  see sheet    Consulted and Agree with Plan of Care  Patient       Patient will benefit from skilled therapeutic intervention in order to improve the following deficits and impairments:  Abnormal gait, Decreased activity tolerance, Decreased balance, Decreased knowledge of precautions, Decreased endurance, Decreased knowledge of use of DME, Decreased mobility, Decreased range of motion, Difficulty walking, Decreased strength, Increased edema, Impaired flexibility, Impaired perceived functional ability, Prosthetic Dependency, Postural dysfunction, Improper body mechanics, Pain  Visit Diagnosis: Other abnormalities of gait and mobility  Muscle weakness (generalized)  Unsteadiness on feet     Problem List Patient Active Problem List   Diagnosis Date Noted  . Abnormality of gait 10/12/2017  . Poorly controlled type 2 diabetes mellitus with peripheral neuropathy ([place])   . Flatulence   . Hypomagnesemia   . Unilateral complete BKA, right, sequela ([place])   . Benign essential HTN   . Hypoalbuminemia due to protein-calorie malnutrition ([place])   . S/P below knee amputation, right ([place]) 04/12/2017  . Acute blood loss anemia   . Other encephalopathy 04/08/2017  . Encephalopathy 04/08/2017  . Altered mental status 04/08/2017  . Labile blood pressure   . Labile blood glucose   . Drug induced constipation   . Stage 3 chronic kidney disease ([place])   . Bacteremia   . S/P unilateral BKA (below knee amputation), right ([place]) 04/04/2017  . PAD (peripheral artery disease) ([place])   . Type 2 diabetes mellitus with right diabetic foot ulcer ([place])   . Post-operative pain   . Legally blind   . Upper GI bleed   . Streptococcal bacteremia 04/01/2017  . Hypokalemia  03/30/2017  . Uncontrolled type 2 diabetes mellitus with hyperglycemia, with long-term current use of insulin ([place]) 03/30/2017  . Type 2 diabetes mellitus with peripheral neuropathy ([place]) 03/30/2017  . AKI (acute kidney injury) ([place]) 03/30/2017  . CKD stage 3 due to type 2 diabetes mellitus ([place]) 03/30/2017  . Sepsis ([place]) 03/30/2017  . Heart murmur 11/22/2016  . Hyperlipidemia associated with type 2 diabetes mellitus ([place]) 11/22/2016  . Obstructive sleep apnea syndrome 11/22/2016  . Essential hypertension 12/17/2012   Trevor [name], Trevor [name], Trevor [name]  Huprich,Jason 11/07/2017, 8:15 PM  [place] MAIN [facility] SERVICES [address] [place], [place], [address] Phone: [phone]   Fax:  [phone]  Name: YADEN SEITH MRN: [id number] Date of Birth: January 02, 1956

## 2017-11-09 ENCOUNTER — Ambulatory Visit: Payer: Medicaid Other

## 2017-11-09 ENCOUNTER — Ambulatory Visit (INDEPENDENT_AMBULATORY_CARE_PROVIDER_SITE_OTHER): Payer: Medicaid Other | Admitting: Orthopedic Surgery

## 2017-11-09 ENCOUNTER — Encounter (INDEPENDENT_AMBULATORY_CARE_PROVIDER_SITE_OTHER): Payer: Self-pay | Admitting: Physician Assistant

## 2017-11-09 VITALS — Ht 76.0 in | Wt 285.9 lb

## 2017-11-09 DIAGNOSIS — R2689 Other abnormalities of gait and mobility: Secondary | ICD-10-CM

## 2017-11-09 DIAGNOSIS — R2681 Unsteadiness on feet: Secondary | ICD-10-CM

## 2017-11-09 DIAGNOSIS — Z89511 Acquired absence of right leg below knee: Secondary | ICD-10-CM

## 2017-11-09 DIAGNOSIS — M6281 Muscle weakness (generalized): Secondary | ICD-10-CM

## 2017-11-09 NOTE — Therapy (Signed)
[place] [facility] [address] [place], [place], [address] Phone: [phone]   Fax:  [phone]  Physical Therapy Treatment  Patient Details  Name: Trevor [name] MRN: [id number] Date of Birth: 06/28/1955 Referring Provider (PT): Jamse Arn, DR   Encounter Date: 11/09/2017  PT End of Session - 11/09/17 1639    Visit Number  14    Number of Visits  19    Date for PT Re-Evaluation  11/15/17    Authorization Type   PN 4/10 start 10/10     PT Start Time  1345    PT Stop Time  1430    PT Time Calculation (min)  45 min    Equipment Utilized During Treatment  Gait belt   R prosthesis   Activity Tolerance  Patient tolerated treatment well;Other (comment)    Behavior During Therapy  WFL for tasks assessed/performed       Past Medical History:  Diagnosis Date  . Diabetes mellitus without complication ([place])   . Heart murmur   . Hyperlipidemia   . Hypertension   . Sleep apnea     Past Surgical History:  Procedure Laterality Date  . AMPUTATION Right 03/31/2017   Procedure: RIGHT FOOT 1ST AND 2ND RAY AMPUTATION;  Surgeon: Newt Minion, MD;  Location: [place];  Service: Orthopedics;  Laterality: Right;  . AMPUTATION Right 04/01/2017   Procedure: AMPUTATION BELOW KNEE;  Surgeon: Newt Minion, MD;  Location: [place];  Service: Orthopedics;  Laterality: Right;  . COLONOSCOPY WITH PROPOFOL N/A 10/02/2017   Procedure: COLONOSCOPY WITH PROPOFOL;  Surgeon: [name], MD;  Location: [facility] ENDOSCOPY;  Service: Gastroenterology;  Laterality: N/A;  . CORNEAL TRANSPLANT      There were no vitals filed for this visit.  Subjective Assessment - 11/09/17 1421    Subjective  Pt reports that he is doing well on this date. No specific questions or concerns upon arrival. He is performing his HEP.     Pertinent History  62 year old right-handed male with history of diabetes mellitus, hypertension, legally blind, and CKD presenting for BKA  04/01/17. Patient wears contacts and can see better now.  Patient had two surgeries, one was a couple toes then they decided to go below the knee. 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 Has ambulated with RW in hospital with PT. Patient has had home health therapy and was in inpatient rehab about a month. Patient reports he was mainly sitting down, with occasional walking, last home health therapy in May. Patient reports some standing up but no exercises. Patient lives alone in the house and is scared they might fall. Patient received prosthetic leg Tuesday of last week.    Limitations  Standing;Walking;House hold activities;Other (comment)    How long can you stand comfortably?  10-15 minutes    How long can you walk comfortably?  10-15 minutes    Patient Stated Goals  going up steps and driving car.     Currently in Pain?  No/denies         TREATMENT   Gait Training Gait training with single point cane in hallway. Cane in LUE with 2 point pattern of ambulation (cane and R foot strike at same time) larger BOS with greater amplitude of step length. Cues for increased L step length, CGA with WC followapproximately 464'. Pt ambulates 154', 160', and then  an additional 150.' 2 seated rest breaks secondary to fatigue. Multiple attempts initially to find the right sock thickness and placement of the pin. Ended up using a 5 ply sock and a slightly anterior pin placement. Pain improves. Verbal cues for upright posture and neutral alignment of LLE. Fatigue monitored.   Manual Therapy  Prone R hip flexor stretch 30s hold x 4; Prone R hip PA mobilizations to improve extension, grade III, 30s/bout x 3 bouts. Belt utilized to assist in extending LE.    Pt educated throughout session about proper posture and technique with exercises. Improved exercise technique, movement at target joints, use of target muscles after min to mod  verbal, visual, tactile cues.    Continued gait training with patient utilizing single point cane in LUE. He ambulated in hallway with [place] with step through gait pattern. Multiple attempts initially during ambulation to find the right sock thickness and placement of the pin. Ended up using a 5 ply sock and a slightly anterior pin placement. Pain improves. Prone hip flexor stretching and posterior to anterior hip mobilizations to improves hip extension. Patient will continue to benefit from skilled physical therapy to increase functional mobility, improve prosthetic use, and increase ambulation to return to PLOF.                        PT Short Term Goals - 10/19/17 1042      PT SHORT TERM GOAL #1   Title  Patient will be independent in home exercise program to improve strength/mobility for better functional independence with ADLs.    Baseline  HEP given     Time  2    Period  Weeks    Status  Partially Met      PT SHORT TERM GOAL #2   Title  Patient will don/doff prosthesis independently to allow for increased mobility in home.     Baseline  8/14: requires PT to guide/direct through process 8/28: independent    Time  2    Period  Weeks    Status  Achieved      PT SHORT TERM GOAL #3   Title  Patient will ambulate 10 meters with least assistive device and prosthesis to improve mobility in home.     Baseline  8/14: not walking outside of // bars yet  8/28: 63 seconds with RW and CGA     Time  2    Period  Weeks    Status  Achieved      PT SHORT TERM GOAL #4   Title  Patient will perform STS with single UE support to decrease reliance upon UE's for stability     Baseline  patient requires BUE support    Time  2    Period  Weeks    Status  New    Target Date  11/02/17        PT Long Term Goals - 10/19/17 0945      PT LONG TERM GOAL #1   Title  Patient will ambulate 60 ft with least assistive AD and prosthesis to improve mobility around home and increase  independence.     Baseline  8/14: requires use of // bars 8/28: 30 ft with RW and CGA 9/17: 74 ft x2 with CGA and RW     Time  4    Period  Weeks    Status  Achieved      PT LONG TERM GOAL #2  Title  Patient (> 50 years old) will complete five times sit to stand test in < 15 seconds indicating an increased LE strength and improved balance.    Baseline  8/14: 61 seconds 8/28; 42 seconds 9/17: 24 seconds BUE support with LLE 10/10: 17 seconds with excessive BUE support     Time  4    Period  Weeks    Status  Partially Met      PT LONG TERM GOAL #3   Title  Patient will reduce timed up and go to <11 seconds to reduce fall risk and demonstrate improved transfer/gait ability.    Baseline  8/14: unable to perform 8/28: 1 min 30 seconds 9/17: 38 seconds RW 10/10: 25 seconds with RW     Time  4    Period  Weeks    Status  Partially Met    Target Date  11/15/17      PT LONG TERM GOAL #4   Title  Patient will increase lower extremity functional scale to >60/80 to demonstrate improved functional mobility and increased tolerance with ADLs.     Baseline  814: 29/80 8/28: 20/80 10/10: 20/80    Time  4    Period  Weeks    Status  Partially Met    Target Date  11/15/17      PT LONG TERM GOAL #5   Title  Patient will increase BLE gross strength to 4+/5 as to improve functional strength for independent gait, increased standing tolerance and increased ADL ability.    Baseline  8/14: 4-/15 8/28: 4-/5  9/17: L 4-/5 R 4/5      Time  4    Period  Weeks    Status  Achieved      PT LONG TERM GOAL #6   Title  Patient will perform 10 MWT in >.5 m/s for improved mobility and safety with negotiating natural environment    Baseline  8/28: .16 m/s with RW  9/17: .53 m/s     Time  4    Period  Weeks    Status  Achieved      PT LONG TERM GOAL #7   Title  Patient will perform 10 MWT in >1.0 m/s for improved mobility and safety with negotiating natural environment    Baseline  9/17: .53 m/s 10/10: .63 m/s  with RW     Time  4    Period  Weeks    Status  Partially Met    Target Date  11/15/17      PT LONG TERM GOAL #8   Title  Patient will ambulate 300 ft with least assistive AD and prosthesis to improve mobility around home and increase independence.     Baseline  9/17: 74 ft CGA with RW 10/10 ambulated 280 ft with RW, 70 ft with SPC     Time  4    Period  Weeks    Status  Partially Met    Target Date  11/15/17      PT LONG TERM GOAL  #9   TITLE  Patient will ascend/descend 4 stairs with rail assist independently without loss of balance to improve ability to get in/out of home.     Baseline  9/17: unable to negotiate stairs 10/10: ascend with step to pattern, BUE support, and CGA    Time  4    Period  Weeks    Status  Partially Met    Target Date  11/15/17  Plan - 11/09/17 1640    Clinical Impression Statement  Continued gait training with patient utilizing single point cane in LUE. He ambulated in hallway with [place] with step through gait pattern. Multiple attempts initially during ambulation to find the right sock thickness and placement of the pin. Ended up using a 5 ply sock and a slightly anterior pin placement. Pain improves. Prone hip flexor stretching and posterior to anterior hip mobilizations to improves hip extension. Patient will continue to benefit from skilled physical therapy to increase functional mobility, improve prosthetic use, and increase ambulation to return to PLOF.    Rehab Potential  Fair    Clinical Impairments Affecting Rehab Potential  (+) previous independence, motivation to return to walking (-) lives alone, hx of diabetes, limited vision     PT Frequency  2x / week    PT Duration  4 weeks    PT Treatment/Interventions  ADLs/Self Care Home Management;Cryotherapy;Electrical Stimulation;Ultrasound;Traction;Moist Heat;DME Instruction;Gait training;Stair training;Functional mobility training;Therapeutic activities;Therapeutic exercise;Patient/family  education;Neuromuscular re-education;Balance training;Prosthetic Training;Wheelchair mobility training;Manual techniques;Manual lymph drainage;Compression bandaging;Taping;Energy conservation;Passive range of motion    PT Next Visit Plan  steps in // bars     PT Home Exercise Plan  see sheet    Consulted and Agree with Plan of Care  Patient       Patient will benefit from skilled therapeutic intervention in order to improve the following deficits and impairments:  Abnormal gait, Decreased activity tolerance, Decreased balance, Decreased knowledge of precautions, Decreased endurance, Decreased knowledge of use of DME, Decreased mobility, Decreased range of motion, Difficulty walking, Decreased strength, Increased edema, Impaired flexibility, Impaired perceived functional ability, Prosthetic Dependency, Postural dysfunction, Improper body mechanics, Pain  Visit Diagnosis: Other abnormalities of gait and mobility  Muscle weakness (generalized)  Unsteadiness on feet     Problem List Patient Active Problem List   Diagnosis Date Noted  . Abnormality of gait 10/12/2017  . Poorly controlled type 2 diabetes mellitus with peripheral neuropathy ([employer])   . Flatulence   . Hypomagnesemia   . Unilateral complete BKA, right, sequela ([place])   . Benign essential HTN   . Hypoalbuminemia due to protein-calorie malnutrition ([place])   . S/P below knee amputation, right ([place]) 04/12/2017  . Acute blood loss anemia   . Other encephalopathy 04/08/2017  . Encephalopathy 04/08/2017  . Altered mental status 04/08/2017  . Labile blood pressure   . Labile blood glucose   . Drug induced constipation   . Stage 3 chronic kidney disease ([place])   . Bacteremia   . S/P unilateral BKA (below knee amputation), right ([place]) 04/04/2017  . PAD (peripheral artery disease) ([place])   . Type 2 diabetes mellitus with right diabetic foot ulcer ([place])   . Post-operative pain   . Legally blind   . Upper GI bleed   . Streptococcal  bacteremia 04/01/2017  . Hypokalemia 03/30/2017  . Uncontrolled type 2 diabetes mellitus with hyperglycemia, with long-term current use of insulin ([place]) 03/30/2017  . Type 2 diabetes mellitus with peripheral neuropathy ([place]) 03/30/2017  . AKI (acute kidney injury) ([place]) 03/30/2017  . CKD stage 3 due to type 2 diabetes mellitus ([place]) 03/30/2017  . Sepsis ([place]) 03/30/2017  . Heart murmur 11/22/2016  . Hyperlipidemia associated with type 2 diabetes mellitus ([place]) 11/22/2016  . Obstructive sleep apnea syndrome 11/22/2016  . Essential hypertension 12/17/2012   Lyndel Safe Lovell Roe PT, DPT, GCS  Monita Swier 11/09/2017, 4:46 PM  [place] [facility] [place]  [place], [place], [address] Phone: [phone]   Fax:  [phone]  Name: GARHETT BERNHARD MRN: [id number] Date of Birth: 1955/12/31

## 2017-11-14 ENCOUNTER — Ambulatory Visit: Payer: Medicare Other | Attending: Physical Medicine & Rehabilitation

## 2017-11-14 ENCOUNTER — Encounter (INDEPENDENT_AMBULATORY_CARE_PROVIDER_SITE_OTHER): Payer: Self-pay | Admitting: Orthopedic Surgery

## 2017-11-14 DIAGNOSIS — R2681 Unsteadiness on feet: Secondary | ICD-10-CM | POA: Insufficient documentation

## 2017-11-14 DIAGNOSIS — R2689 Other abnormalities of gait and mobility: Secondary | ICD-10-CM | POA: Diagnosis present

## 2017-11-14 DIAGNOSIS — S88111S Complete traumatic amputation at level between knee and ankle, right lower leg, sequela: Secondary | ICD-10-CM | POA: Diagnosis present

## 2017-11-14 DIAGNOSIS — M6281 Muscle weakness (generalized): Secondary | ICD-10-CM | POA: Diagnosis present

## 2017-11-14 DIAGNOSIS — R5383 Other fatigue: Secondary | ICD-10-CM | POA: Insufficient documentation

## 2017-11-14 NOTE — Therapy (Addendum)
[place] MAIN [facility] SERVICES [address] [place], [place], [address] Phone: [phone]   Fax:  [phone]  Physical Therapy Treatment/ Recert  Patient Details  Name: Trevor [name] MRN: [id number] Date of Birth: October 23, 1955 Referring Provider (PT): Jamse Arn, DR   Encounter Date: 11/14/2017  PT End of Session - 11/14/17 1245    Visit Number  15    Number of Visits  27    Date for PT Re-Evaluation  12/12/17    Authorization Type   PN 5/10 start 10/10     PT Start Time  1030    PT Stop Time  1115    PT Time Calculation (min)  45 min    Equipment Utilized During Treatment  Gait belt   R prosthesis   Activity Tolerance  Patient tolerated treatment well;Other (comment)    Behavior During Therapy  WFL for tasks assessed/performed       Past Medical History:  Diagnosis Date  . Diabetes mellitus without complication ([place])   . Heart murmur   . Hyperlipidemia   . Hypertension   . Sleep apnea     Past Surgical History:  Procedure Laterality Date  . AMPUTATION Right 03/31/2017   Procedure: RIGHT FOOT 1ST AND 2ND RAY AMPUTATION;  Surgeon: Newt Minion, MD;  Location: [facility];  Service: Orthopedics;  Laterality: Right;  . AMPUTATION Right 04/01/2017   Procedure: AMPUTATION BELOW KNEE;  Surgeon: Newt Minion, MD;  Location: [facility];  Service: Orthopedics;  Laterality: Right;  . COLONOSCOPY WITH PROPOFOL N/A 10/02/2017   Procedure: COLONOSCOPY WITH PROPOFOL;  Surgeon: [name], MD;  Location[facility] ENDOSCOPY;  Service: Gastroenterology;  Laterality: N/A;  . CORNEAL TRANSPLANT      There were no vitals filed for this visit.  Subjective Assessment - 11/14/17 1032    Subjective  Patient states he is doing okay. States he had pain in residual limb that began on Sunday while walking. States he has recently had pain along distal end of residual limb while standing and walking.    Pertinent History  62 year old right-handed male with  history of diabetes mellitus, hypertension, legally blind, and CKD presenting for BKA 04/01/17. Patient wears contacts and can see better now.  Patient had two surgeries, one was a couple toes then they decided to go below the knee. 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 Has ambulated with RW in hospital with PT. Patient has had home health therapy and was in inpatient rehab about a month. Patient reports he was mainly sitting down, with occasional walking, last home health therapy in May. Patient reports some standing up but no exercises. Patient lives alone in the house and is scared they might fall. Patient received prosthetic leg Tuesday of last week.    Limitations  Standing;Walking;House hold activities;Other (comment)    How long can you stand comfortably?  10-15 minutes    How long can you walk comfortably?  10-15 minutes    Patient Stated Goals  going up steps and driving car.     Currently in Pain?  No/denies      Patient is wearing 5ply initially. Changed sock ply to adjust fit of prosthetic; multiple times to reduce pain. Patient complaints of pain along distal end of residual limb while weight bearing with no change with 3,4, and 5 ply.     Recert/PN  STS- achieved-decreasing UE support  5xsts-18 seconds with BUE support TUG- 20 seconds with cane LEFS- 22/80 55mt- 15 seconds with cane. 0.625m   Standing: Ambulate with different thickness socks to determine proper fit with decreased pain x 4 attempts with SPC  Seated Marches x10 with each LE with prosthetic on  Prone Hip flexor stretch 2x60 seconds on each LE. Mo[place]aor appropriate alignment Hip extension with leg straight x10 each LE     .                 PT Education - 11/14/17 1245    Education Details  exercise technique, mobility, swelling, ply wear, follow up with prosthetist     Person(s) Educated  Patient    Methods   Explanation;Demonstration;Verbal cues    Comprehension  Verbalized understanding;Returned demonstration       PT Short Term Goals - 11/14/17 1056      PT SHORT TERM GOAL #1   Title  Patient will be independent in home exercise program to improve strength/mobility for better functional independence with ADLs.    Baseline  HEP given     Time  2    Period  Weeks    Status  Achieved      PT SHORT TERM GOAL #2   Title  Patient will don/doff prosthesis independently to allow for increased mobility in home.     Baseline  8/14: requires PT to guide/direct through process 8/28: independent    Time  2    Period  Weeks    Status  Achieved      PT SHORT TERM GOAL #3   Title  Patient will ambulate 10 meters with least assistive device and prosthesis to improve mobility in home.     Baseline  8/14: not walking outside of // bars yet  8/28: 63 seconds with RW and CGA     Time  2    Period  Weeks    Status  Achieved      PT SHORT TERM GOAL #4   Title  Patient will perform STS with single UE support to decrease reliance upon UE's for stability     Baseline  patient requires BUE support 11/5: 1 UR support    Time  2    Period  Weeks    Status  Achieved        PT Long Term Goals - 11/14/17 1057      PT LONG TERM GOAL #1   Title  Patient will ambulate 60 ft with least assistive AD and prosthesis to improve mobility around home and increase independence.     Baseline  8/14: requires use of // bars 8/28: 30 ft with RW and CGA 9/17: 74 ft x2 with CGA and RW     Time  4    Period  Weeks    Status  Achieved      PT LONG TERM GOAL #2   Title  Patient (> 6058ears old) will complete five times sit to stand test in < 15 seconds indicating an increased LE strength and improved balance.    Baseline  8/14: 61 seconds 8/28; 42 seconds 9/17: 24 seconds BUE support with LLE 10/10: 17 seconds with excessive BUE support  11/5: 18seconds BUE support    Time  4    Period  Weeks    Status  Partially Met       PT LONG TERM GOAL #3   Title  Patient will  reduce timed up and go to <11 seconds to reduce fall risk and demonstrate improved transfer/gait ability.    Baseline  8/14: unable to perform 8/28: 1 min 30 seconds 9/17: 38 seconds RW 10/10: 25 seconds with RW 11/5: 20 seconds with cane    Time  4    Period  Weeks    Status  Partially Met      PT LONG TERM GOAL #4   Title  Patient will increase lower extremity functional scale to >60/80 to demonstrate improved functional mobility and increased tolerance with ADLs.     Baseline  814: 29/80 8/28: 20/80 10/10: 20/80 11/5: 22/80    Time  4    Period  Weeks    Status  Partially Met      PT LONG TERM GOAL #5   Title  Patient will increase BLE gross strength to 4+/5 as to improve functional strength for independent gait, increased standing tolerance and increased ADL ability.    Baseline  8/14: 4-/15 8/28: 4-/5  9/17: L 4-/5 R 4/5      Time  4    Period  Weeks    Status  Achieved      Additional Long Term Goals   Additional Long Term Goals  Yes      PT LONG TERM GOAL #6   Title  Patient will perform 10 MWT in >.5 m/s for improved mobility and safety with negotiating natural environment    Baseline  8/28: .16 m/s with RW  9/17: .53 m/s     Time  4    Period  Weeks    Status  Achieved      PT LONG TERM GOAL #7   Title  Patient will perform 10 MWT in >1.0 m/s for improved mobility and safety with negotiating natural environment    Baseline  9/17: .53 m/s 10/10: .63 m/s with RW 11/5: 0.35ms    Time  4    Period  Weeks    Status  Partially Met      PT LONG TERM GOAL #8   Title  Patient will ambulate 300 ft with least assistive AD and prosthesis to improve mobility around home and increase independence.     Baseline  9/17: 74 ft CGA with RW 10/10 ambulated 280 ft with RW, [facility]: ambulate 4635fon 10/31    Time  4    Period  Weeks    Status  Achieved      PT LONG TERM GOAL  #9   TITLE  Patient will ascend/descend 4  stairs with rail assist independently without loss of balance to improve ability to get in/out of home.     Baseline  9/17: unable to negotiate stairs 10/10: ascend with step to pattern, BUE support, and CGA 11/5: patient demonstrates ability to perform CGA and BUE support    Time  4    Period  Weeks    Status  Achieved      PT LONG TERM GOAL  #10   TITLE  Patient will ascend/descend 4 stairs with rail assist independently with reciprocating pattern without loss of balance to improve ability to get in/out of home.     Baseline  11/5: step to pattern BUE support CGA    Time  4    Period  Weeks    Status  New    Target Date  12/12/17      PT LONG TERM GOAL  #11  TITLE  Patient will ambulate >1057f during 681m with LRAD to demonstrate improved community ambulation and functional mobility     Baseline  11/5: will complete next session    Time  4    Period  Weeks    Status  New    Target Date  12/12/17            Plan - 11/14/17 1257    Clinical Impression Statement   Patient goals were updated. Patient can successfully stand from chair with 1 UE support demonstrating decreased reliance on UE for stability. Patient still unable to complete without UE support due to weakness and instability. Patient also improved TUG and 1086mtime and has currently progressed to using a cane instead of RW. Patient negotiates steps with step to pattern; not demonstrating ability to perform reciprocally at this time.Patient is currently experiencing pain along distal end of residual limb limiting weight bearing tolerance. Patient's condition has the potential to improve in response to therapy. Maximum improvement is yet to be obtained. The anticipated improvement is attainable and reasonable in a generally predictable time. Patient has follow up with prosthetist in next couple weeks to potentially adjust fit of prosthetic. Patient will continue to benefit from skilled physical therapy to increase functional  mobility, improve prosthetic use, and increase ambulation to return to PLOF    Rehab Potential  Fair    Clinical Impairments Affecting Rehab Potential  (+) previous independence, motivation to return to walking (-) lives alone, hx of diabetes, limited vision     PT Frequency  2x / week    PT Duration  4 weeks    PT Treatment/Interventions  ADLs/Self Care Home Management;Cryotherapy;Electrical Stimulation;Ultrasound;Traction;Moist Heat;DME Instruction;Gait training;Stair training;Functional mobility training;Therapeutic activities;Therapeutic exercise;Patient/family education;Neuromuscular re-education;Balance training;Prosthetic Training;Wheelchair mobility training;Manual techniques;Manual lymph drainage;Compression bandaging;Taping;Energy conservation;Passive range of motion    PT Next Visit Plan  steps in // bars     PT Home Exercise Plan  see sheet    Consulted and Agree with Plan of Care  Patient       Patient will benefit from skilled therapeutic intervention in order to improve the following deficits and impairments:  Abnormal gait, Decreased activity tolerance, Decreased balance, Decreased knowledge of precautions, Decreased endurance, Decreased knowledge of use of DME, Decreased mobility, Decreased range of motion, Difficulty walking, Decreased strength, Increased edema, Impaired flexibility, Impaired perceived functional ability, Prosthetic Dependency, Postural dysfunction, Improper body mechanics, Pain  Visit Diagnosis: Other abnormalities of gait and mobility  Muscle weakness (generalized)  Unsteadiness on feet  Unilateral complete BKA, right, sequela (HCCDickinson   Problem List Patient Active Problem List   Diagnosis Date Noted  . Abnormality of gait 10/12/2017  . Poorly controlled type 2 diabetes mellitus with peripheral neuropathy (HCCUnder[place] . Flatulence   . Hypomagnesemia   . Unilateral complete BKA, right, sequela (HCCWrens . Benign essential HTN   . Hypoalbuminemia due to  protein-calorie malnutrition (HCCOcta . S/P below knee amputation, right (HCCGravity4/03/2017  . Acute blood loss anemia   . Other encephalopathy 04/08/2017  . Encephalopathy 04/08/2017  . Altered mental status 04/08/2017  . Labile blood pressure   . Labile blood glucose   . Drug induced constipation   . Stage 3 chronic kidney disease (HCCDavis . Bacteremia   . S/P unilateral BKA (below knee amputation), right (HCCOld Orchard3/26/2019  . PAD (peripheral artery disease) (HCCAlpena . Type 2 diabetes mellitus with right diabetic foot ulcer (HCCSpring Valley .  Post-operative pain   . Legally blind   . Upper GI bleed   . Streptococcal bacteremia 04/01/2017  . Hypokalemia 03/30/2017  . Uncontrolled type 2 diabetes mellitus with hyperglycemia, with long-term current use of insulin ([place]) 03/30/2017  . Type 2 diabetes mellitus with peripheral neuropathy ([place]) 03/30/2017  . AKI (acute kidney injury) ([place]) 03/30/2017  . CKD stage 3 due to type 2 diabetes mellitus ([place]) 03/30/2017  . Sepsis ([place]) 03/30/2017  . Heart murmur 11/22/2016  . Hyperlipidemia associated with type 2 diabetes mellitus ([place]) 11/22/2016  . Obstructive sleep apnea syndrome 11/22/2016  . Essential hypertension 12/17/2012   Erick Blinks, SPT  This entire session was performed under direct supervision and direction of a licensed therapist/therapist assistant . I have personally read, edited and approve of the note as written.  Janna Arch, PT, DPT   11/14/2017, 1:14 PM  [place] MAIN [facility] SERVICES [address] [place], [place], [address] Phone: [phone]   Fax:  [phone]  Name: CHUKWUDI EWEN MRN: [id number] Date of Birth: [birth year]

## 2017-11-14 NOTE — Progress Notes (Signed)
Office Visit Note   Patient: Trevor [name]           Date of Birth: 1955/07/30           MRN: [id number] Visit Date: 11/09/2017              Requested by: Trevor Pounds, NP [place], [place] [address] PCP: Trevor Coffin, MD  Chief Complaint  Patient presents with  . Right Leg - Follow-up      HPI: Patient is a 62 year old gentleman who presents in follow-up possibly 7 months status post right transtibial amputation.  Patient complains of painful N bearing in his prosthetic socket.  He denies any pain ulceration or edema.  Assessment & Plan: Visit Diagnoses:  1. Acquired absence of right leg below knee [name])     Plan: Patient was given a prescription for biotech to modified the socket fitting patient needs adjustments to minimize developing ulcers from N bearing on the residual limb.  Follow-Up Instructions: Return in about 2 months (around 01/09/2018).   Ortho Exam  Patient is alert, oriented, no adenopathy, well-dressed, normal affect, normal respiratory effort. Examination patient is ambulating in a wheelchair.  Examination of the leg there is no ulcers there is no callus or cellulitis there is tenderness to palpation over the residual limb.  Patient does not have significant atrophy of the leg but is currently an bearing on the end of the residual limb in his socket without a good fit.  Imaging: No results found. No images are attached to the encounter.  Labs: Lab Results  Component Value Date   HGBA1C 6.4 (A) 06/26/2017   HGBA1C 12.5 03/29/2017   HGBA1C 10.2 11/22/2016   REPTSTATUS 04/14/2017 FINAL 04/09/2017   GRAMSTAIN  03/31/2017    ABUNDANT WBC PRESENT, PREDOMINANTLY PMN ABUNDANT GRAM POSITIVE COCCI MODERATE GRAM NEGATIVE RODS Performed at [facility], [place] [address] [place], [place] [address]    CULT  04/09/2017    NO GROWTH 5 DAYS Performed at [facility] [address] [place],  [address]    LABORGA  STREPTOCOCCUS ANGINOSIS 03/31/2017     Lab Results  Component Value Date   ALBUMIN 2.3 (L) 04/13/2017   ALBUMIN 2.4 (L) 04/11/2017   ALBUMIN 2.1 (L) 04/05/2017    Body mass index is 34.81 kg/m.  Orders:  No orders of the defined types were placed in this encounter.  No orders of the defined types were placed in this encounter.    Procedures: No procedures performed  Clinical Data: No additional findings.  ROS:  All other systems negative, except as noted in the HPI. Review of Systems  Objective: Vital Signs: Ht 6\' 4"  (1.93 m)   Wt 285 lb 15 oz (129.7 kg)   BMI 34.81 kg/m   Specialty Comments:  No specialty comments available.  PMFS History: Patient Active Problem List   Diagnosis Date Noted  . Abnormality of gait 10/12/2017  . Poorly controlled type 2 diabetes mellitus with peripheral neuropathy ([name])   . Flatulence   . Hypomagnesemia   . Unilateral complete BKA, right, sequela ([name])   . Benign essential HTN   . Hypoalbuminemia due to protein-calorie malnutrition ([name])   . S/P below knee amputation, right ([name]) 04/12/2017  . Acute blood loss anemia   . Other encephalopathy 04/08/2017  . Encephalopathy 04/08/2017  . Altered mental status 04/08/2017  . Labile blood pressure   . Labile blood glucose   .  Drug induced constipation   . Stage 3 chronic kidney disease ([place])   . Bacteremia   . S/P unilateral BKA (below knee amputation), right ([name]) 04/04/2017  . PAD (peripheral artery disease) ([place])   . Type 2 diabetes mellitus with right diabetic foot ulcer ([name])   . Post-operative pain   . Legally blind   . Upper GI bleed   . Streptococcal bacteremia 04/01/2017  . Hypokalemia 03/30/2017  . Uncontrolled type 2 diabetes mellitus with hyperglycemia, with long-term current use of insulin ([name]) 03/30/2017  . Type 2 diabetes mellitus with peripheral neuropathy ([name]) 03/30/2017  . AKI (acute kidney injury) ([name]) 03/30/2017  . CKD stage 3 due to type 2 diabetes  mellitus ([name]) 03/30/2017  . Sepsis ([name]) 03/30/2017  . Heart murmur 11/22/2016  . Hyperlipidemia associated with type 2 diabetes mellitus ([place]) 11/22/2016  . Obstructive sleep apnea syndrome 11/22/2016  . Essential hypertension 12/17/2012   Past Medical History:  Diagnosis Date  . Diabetes mellitus without complication ([name])   . Heart murmur   . Hyperlipidemia   . Hypertension   . Sleep apnea     History reviewed. No pertinent family history.  Past Surgical History:  Procedure Laterality Date  . AMPUTATION Right 03/31/2017   Procedure: RIGHT FOOT 1ST AND 2ND RAY AMPUTATION;  Surgeon: Newt Minion, MD;  Location: [name];  Service: Orthopedics;  Laterality: Right;  . AMPUTATION Right 04/01/2017   Procedure: AMPUTATION BELOW KNEE;  Surgeon: Newt Minion, MD;  Location: [name];  Service: Orthopedics;  Laterality: Right;  . COLONOSCOPY WITH PROPOFOL Trevor [name] 10/02/2017   Procedure: COLONOSCOPY WITH PROPOFOL;  Surgeon: [name], MD;  Location: [name] ENDOSCOPY;  Service: Gastroenterology;  Laterality: Trevor [name];  . CORNEAL TRANSPLANT     Social History   Occupational History  . Not on file  Tobacco Use  . Smoking status: Never Smoker  . Smokeless tobacco: Never Used  Substance and Sexual Activity  . Alcohol use: No  . Drug use: Never  . Sexual activity: Not on file

## 2017-11-24 ENCOUNTER — Ambulatory Visit: Payer: Medicare Other | Admitting: Physical Therapy

## 2017-11-24 DIAGNOSIS — R2681 Unsteadiness on feet: Secondary | ICD-10-CM

## 2017-11-24 DIAGNOSIS — S88111S Complete traumatic amputation at level between knee and ankle, right lower leg, sequela: Secondary | ICD-10-CM

## 2017-11-24 DIAGNOSIS — M6281 Muscle weakness (generalized): Secondary | ICD-10-CM

## 2017-11-24 DIAGNOSIS — R2689 Other abnormalities of gait and mobility: Secondary | ICD-10-CM

## 2017-11-24 NOTE — Therapy (Addendum)
[place] MAIN [facility] SERVICES [address] [place], [place], [address] Phone: [phone]   Fax:  [phone]  Physical Therapy Treatment  Patient Details  Name: Trevor [name] MRN: [id number] Date of Birth: 03-Jun-1955 Referring Provider (PT): Jamse Arn, DR   Encounter Date: 11/24/2017  PT End of Session - 11/24/17 1031    Visit Number  16    Number of Visits  27    Date for PT Re-Evaluation  12/12/17    Authorization Type   PN 6/10 start 10/10     PT Start Time  0944    PT Stop Time  1032    PT Time Calculation (min)  48 min    Equipment Utilized During Treatment  Gait belt   R prosthesis   Activity Tolerance  Patient tolerated treatment well;Other (comment)    Behavior During Therapy  WFL for tasks assessed/performed       Past Medical History:  Diagnosis Date  . Diabetes mellitus without complication ([place])   . Heart murmur   . Hyperlipidemia   . Hypertension   . Sleep apnea     Past Surgical History:  Procedure Laterality Date  . AMPUTATION Right 03/31/2017   Procedure: RIGHT FOOT 1ST AND 2ND RAY AMPUTATION;  Surgeon: Newt Minion, MD;  Location: [place];  Service: Orthopedics;  Laterality: Right;  . AMPUTATION Right 04/01/2017   Procedure: AMPUTATION BELOW KNEE;  Surgeon: Newt Minion, MD;  Location: [place];  Service: Orthopedics;  Laterality: Right;  . COLONOSCOPY WITH PROPOFOL N/A 10/02/2017   Procedure: COLONOSCOPY WITH PROPOFOL;  Surgeon: [name], MD;  Location: [facility] ENDOSCOPY;  Service: Gastroenterology;  Laterality: N/A;  . CORNEAL TRANSPLANT      There were no vitals filed for this visit.  Subjective Assessment - 11/24/17 0947    Subjective  Patient states he is doing well today. States he went to prosthetist yesterday and he made an adjustment to reduce the pain along distal end of residual limb. States he has not worn prosthetic since then. Denies any pain currently.     Pertinent History   62 year old right-handed male with history of diabetes mellitus, hypertension, legally blind, and CKD presenting for BKA 04/01/17. Patient wears contacts and can see better now.  Patient had two surgeries, one was a couple toes then they decided to go below the knee. 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 Has ambulated with RW in hospital with PT. Patient has had home health therapy and was in inpatient rehab about a month. Patient reports he was mainly sitting down, with occasional walking, last home health therapy in May. Patient reports some standing up but no exercises. Patient lives alone in the house and is scared they might fall. Patient received prosthetic leg Tuesday of last week.    Limitations  Standing;Walking;House hold activities;Other (comment)    How long can you stand comfortably?  10-15 minutes    How long can you walk comfortably?  10-15 minutes    Patient Stated Goals  going up steps and driving car.     Currently in Pain?  No/denies          Vitals: BP: 144/42mHg HR: 64bpm   Patient is wearing 5 ply sock today. Changed to 3 ply due to tightness within socket.       Ambulate with cane through hallway. Cane in  LUE with 2 point pattern of ambulation (cane and R foot strike at same time). Verbal cues for larger BOS with greater amplitude of step length. CGA with WC follow Approximately 530f  2 seated rest breaks due to fatigue. Verbal cues for upright posture and neutral alignment of LLE.     In //bars: 6" step in // bars 10x. Verbal cues to go up with LLE and down with prosthetic leg. 1 UE support CGA  Standing balloon taps reaching in and out of BOS without UE support. CGA 3 minutes   Ambulate without UE support x1 length of bars. CGA. No LOB   Doffing of prosthetic at end of session      PT Education - 11/24/17 0937    Education Details  exercise technique, mobility, swelling, ply wear,  follow up with prosthetist     Person(s) Educated  Patient    Methods  Explanation;Demonstration;Verbal cues    Comprehension  Verbalized understanding;Returned demonstration       PT Short Term Goals - 11/14/17 1056      PT SHORT TERM GOAL #1   Title  Patient will be independent in home exercise program to improve strength/mobility for better functional independence with ADLs.    Baseline  HEP given     Time  2    Period  Weeks    Status  Achieved      PT SHORT TERM GOAL #2   Title  Patient will don/doff prosthesis independently to allow for increased mobility in home.     Baseline  8/14: requires PT to guide/direct through process 8/28: independent    Time  2    Period  Weeks    Status  Achieved      PT SHORT TERM GOAL #3   Title  Patient will ambulate 10 meters with least assistive device and prosthesis to improve mobility in home.     Baseline  8/14: not walking outside of // bars yet  8/28: 63 seconds with RW and CGA     Time  2    Period  Weeks    Status  Achieved      PT SHORT TERM GOAL #4   Title  Patient will perform STS with single UE support to decrease reliance upon UE's for stability     Baseline  patient requires BUE support 11/5: 1 UR support    Time  2    Period  Weeks    Status  Achieved        PT Long Term Goals - 11/14/17 1057      PT LONG TERM GOAL #1   Title  Patient will ambulate 60 ft with least assistive AD and prosthesis to improve mobility around home and increase independence.     Baseline  8/14: requires use of // bars 8/28: 30 ft with RW and CGA 9/17: 74 ft x2 with CGA and RW     Time  4    Period  Weeks    Status  Achieved      PT LONG TERM GOAL #2   Title  Patient (> 660years old) will complete five times sit to stand test in < 15 seconds indicating an increased LE strength and improved balance.    Baseline  8/14: 61 seconds 8/28; 42 seconds 9/17: 24 seconds BUE support with LLE 10/10: 17 seconds with excessive BUE support  11/5:  18seconds BUE support    Time  4    Period  Weeks    Status  Partially Met      PT LONG TERM GOAL #3   Title  Patient will reduce timed up and go to <11 seconds to reduce fall risk and demonstrate improved transfer/gait ability.    Baseline  8/14: unable to perform 8/28: 1 min 30 seconds 9/17: 38 seconds RW 10/10: 25 seconds with RW 11/5: 20 seconds with cane    Time  4    Period  Weeks    Status  Partially Met      PT LONG TERM GOAL #4   Title  Patient will increase lower extremity functional scale to >60/80 to demonstrate improved functional mobility and increased tolerance with ADLs.     Baseline  814: 29/80 8/28: 20/80 10/10: 20/80 11/5: 22/80    Time  4    Period  Weeks    Status  Partially Met      PT LONG TERM GOAL #5   Title  Patient will increase BLE gross strength to 4+/5 as to improve functional strength for independent gait, increased standing tolerance and increased ADL ability.    Baseline  8/14: 4-/15 8/28: 4-/5  9/17: L 4-/5 R 4/5      Time  4    Period  Weeks    Status  Achieved      Additional Long Term Goals   Additional Long Term Goals  Yes      PT LONG TERM GOAL #6   Title  Patient will perform 10 MWT in >.5 m/s for improved mobility and safety with negotiating natural environment    Baseline  8/28: .16 m/s with RW  9/17: .53 m/s     Time  4    Period  Weeks    Status  Achieved      PT LONG TERM GOAL #7   Title  Patient will perform 10 MWT in >1.0 m/s for improved mobility and safety with negotiating natural environment    Baseline  9/17: .53 m/s 10/10: .63 m/s with RW 11/5: 0.7ms    Time  4    Period  Weeks    Status  Partially Met      PT LONG TERM GOAL #8   Title  Patient will ambulate 300 ft with least assistive AD and prosthesis to improve mobility around home and increase independence.     Baseline  9/17: 74 ft CGA with RW 10/10 ambulated 280 ft with RW, [facility]: ambulate 461fon 10/31    Time  4    Period  Weeks    Status   Achieved      PT LONG TERM GOAL  #9   TITLE  Patient will ascend/descend 4 stairs with rail assist independently without loss of balance to improve ability to get in/out of home.     Baseline  9/17: unable to negotiate stairs 10/10: ascend with step to pattern, BUE support, and CGA 11/5: patient demonstrates ability to perform CGA and BUE support    Time  4    Period  Weeks    Status  Achieved      PT LONG TERM GOAL  #10   TITLE  Patient will ascend/descend 4 stairs with rail assist independently with reciprocating pattern without loss of balance to improve ability to get in/out of home.     Baseline  11/5: step to pattern BUE support CGA    Time  4    Period  Weeks  Status  New    Target Date  12/12/17      PT LONG TERM GOAL  #11   TITLE  Patient will ambulate >1046f during 669m with LRAD to demonstrate improved community ambulation and functional mobility     Baseline  11/5: will complete next session    Time  4    Period  Weeks    Status  New    Target Date  12/12/17            Plan - 11/24/17 1040    Clinical Impression Statement  [facility] household and community ambulation with improved distal pain in residual limb following prosthetic adjustment (1-2/10). Patient completed standing balance balloon taps with CGA and without UE support or LOB demonstrating improved dynamic balance. Patient also successfully ambulated in //bars without UE assistance for a lap with CGA but with impaired gait mechanics with increased BOS and waddling noted. Patient continues to require education regarding ply wear/adjustment depending on swelling in RLE. Patient will continue to benefit from skilled physical therapy to improve functional mobility, improve prosthetic use, and increase ambulation to increase LOF and improve overall QOL.      Rehab Potential  Fair    Clinical Impairments Affecting Rehab Potential  (+) previous independence, motivation to  return to walking (-) lives alone, hx of diabetes, limited vision     PT Frequency  2x / week    PT Duration  4 weeks    PT Treatment/Interventions  ADLs/Self Care Home Management;Cryotherapy;Electrical Stimulation;Ultrasound;Traction;Moist Heat;DME Instruction;Gait training;Stair training;Functional mobility training;Therapeutic activities;Therapeutic exercise;Patient/family education;Neuromuscular re-education;Balance training;Prosthetic Training;Wheelchair mobility training;Manual techniques;Manual lymph drainage;Compression bandaging;Taping;Energy conservation;Passive range of motion    PT Next Visit Plan  steps in // bars     PT Home Exercise Plan  see sheet    Consulted and Agree with Plan of Care  Patient       Patient will benefit from skilled therapeutic intervention in order to improve the following deficits and impairments:  Abnormal gait, Decreased activity tolerance, Decreased balance, Decreased knowledge of precautions, Decreased endurance, Decreased knowledge of use of DME, Decreased mobility, Decreased range of motion, Difficulty walking, Decreased strength, Increased edema, Impaired flexibility, Impaired perceived functional ability, Prosthetic Dependency, Postural dysfunction, Improper body mechanics, Pain  Visit Diagnosis: Other abnormalities of gait and mobility  Muscle weakness (generalized)  Unilateral complete BKA, right, sequela (HCC)  Unsteadiness on feet     Problem List Patient Active Problem List   Diagnosis Date Noted  . Abnormality of gait 10/12/2017  . Poorly controlled type 2 diabetes mellitus with peripheral neuropathy (HCK[place]igl[place]  . Flatulence   . Hypomagnesemia   . Unilateral complete BKA, right, sequela (HCTyr[place]  . Benign essential HTN   . Hypoalbuminemia due to protein-calorie malnutrition (HCOconomowoc Lake  . S/P below knee amputation, right (HCJackson[place]04/03/2017  . Acute blood loss anemia   . Other encephalopathy 04/08/2017  . Encephalopathy 04/08/2017  . Altered  mental status 04/08/2017  . Labile blood pressure   . Labile blood glucose   . Drug induced constipation   . Stage 3 chronic kidney disease (HCSelinsgro[place]  . Bacteremia   . S/P unilateral BKA (below knee amputation), right (HCEvansville03/26/2019  . PAD (peripheral artery disease) (HCCom[place]  . Type 2 diabetes mellitus with right diabetic foot ulcer (HCWins[place]  . Post-operative pain   . Legally blind   . Upper GI bleed   . Streptococcal bacteremia 04/01/2017  .  Hypokalemia 03/30/2017  . Uncontrolled type 2 diabetes mellitus with hyperglycemia, with long-term current use of insulin ([place]) 03/30/2017  . Type 2 diabetes mellitus with peripheral neuropathy ([place]) 03/30/2017  . AKI (acute kidney injury) ([place]) 03/30/2017  . CKD stage 3 due to type 2 diabetes mellitus ([place]) 03/30/2017  . Sepsis ([place]) 03/30/2017  . Heart murmur 11/22/2016  . Hyperlipidemia associated with type 2 diabetes mellitus ([place]) 11/22/2016  . Obstructive sleep apnea syndrome 11/22/2016  . Essential hypertension 12/17/2012   Erick Blinks, SPT  This entire session was performed under direct supervision and direction of a licensed therapist/therapist assistant . I have personally read, edited and approve of the note as written.  Myles Gip PT, DPT [phone] 11/24/2017, 11:27 AM  [place] MAIN [facility] SERVICES [address] [place], [place], [address] Phone: [phone]   Fax:  [phone]  Name: DAXON KYNE MRN: [id number] Date of Birth: [birth year]

## 2017-11-28 ENCOUNTER — Encounter: Payer: Self-pay | Admitting: Physical Therapy

## 2017-11-28 ENCOUNTER — Ambulatory Visit: Payer: Medicare Other | Admitting: Physical Therapy

## 2017-11-28 VITALS — BP 137/73 | HR 54

## 2017-11-28 DIAGNOSIS — S88111S Complete traumatic amputation at level between knee and ankle, right lower leg, sequela: Secondary | ICD-10-CM

## 2017-11-28 DIAGNOSIS — R2689 Other abnormalities of gait and mobility: Secondary | ICD-10-CM

## 2017-11-28 DIAGNOSIS — R2681 Unsteadiness on feet: Secondary | ICD-10-CM

## 2017-11-28 DIAGNOSIS — M6281 Muscle weakness (generalized): Secondary | ICD-10-CM

## 2017-11-28 DIAGNOSIS — R5383 Other fatigue: Secondary | ICD-10-CM

## 2017-11-29 NOTE — Therapy (Signed)
[place] MAIN [facility] SERVICES [address] [place], [place], [address] Phone: [phone]   Fax:  [phone]  Physical Therapy Treatment  Patient Details  Name: Trevor [name] MRN: [id number] Date of Birth: 04/14/1955 Referring Provider (PT): Jamse Arn, DR   Encounter Date: 11/28/2017  PT End of Session - 11/28/17 1256    Visit Number  17    Number of Visits  27    Date for PT Re-Evaluation  12/12/17    Authorization Type   PN 7/10 start 10/10     PT Start Time  0855    PT Stop Time  0945    PT Time Calculation (min)  50 min    Equipment Utilized During Treatment  Gait belt   R prosthesis   Activity Tolerance  Patient tolerated treatment well;Other (comment)    Behavior During Therapy  WFL for tasks assessed/performed       Past Medical History:  Diagnosis Date  . Diabetes mellitus without complication ([place])   . Heart murmur   . Hyperlipidemia   . Hypertension   . Sleep apnea     Past Surgical History:  Procedure Laterality Date  . AMPUTATION Right 03/31/2017   Procedure: RIGHT FOOT 1ST AND 2ND RAY AMPUTATION;  Surgeon: Newt Minion, MD;  Location: [place];  Service: Orthopedics;  Laterality: Right;  . AMPUTATION Right 04/01/2017   Procedure: AMPUTATION BELOW KNEE;  Surgeon: Newt Minion, MD;  Location: [place];  Service: Orthopedics;  Laterality: Right;  . COLONOSCOPY WITH PROPOFOL N/A 10/02/2017   Procedure: COLONOSCOPY WITH PROPOFOL;  Surgeon: [name], MD;  Location: [facility] ENDOSCOPY;  Service: Gastroenterology;  Laterality: N/A;  . CORNEAL TRANSPLANT      Vitals:   11/28/17 0907  BP: 137/73  Pulse: (!) 54  SpO2: 100%    Subjective Assessment - 11/28/17 1253    Subjective  Patient reports no significant changes since his last session. He reports being disheartened by being stuck at home since he can't drive anymore, but on the whole feels he is making good progress in therapy.    Pertinent History   62 year old right-handed male with history of diabetes mellitus, hypertension, legally blind, and CKD presenting for BKA 04/01/17. Patient wears contacts and can see better now.  Patient had two surgeries, one was a couple toes then they decided to go below the knee. 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 Has ambulated with RW in hospital with PT. Patient has had home health therapy and was in inpatient rehab about a month. Patient reports he was mainly sitting down, with occasional walking, last home health therapy in May. Patient reports some standing up but no exercises. Patient lives alone in the house and is scared they might fall. Patient received prosthetic leg Tuesday of last week.    Limitations  Standing;Walking;House hold activities;Other (comment)    How long can you stand comfortably?  10-15 minutes    How long can you walk comfortably?  10-15 minutes    Patient Stated Goals  going up steps and driving car.     Currently in Pain?  No/denies       TREATMENT  Donning of prosthesis  Therapeutic Exercise: STS (w/c height plinth) 2x10, RW in front, decreased UE support, SBA Supine SLR BLE (RLE w/ prosthesis) L 2x10; R 1x15 SL hip circles (RLE w/ prosthesis)  L x5 each direction, R x10 each direction  Gait Training: RW gait for 25 feet w/ WC follow, SBA. Patient maintains even strides and demonstrates decreased reliance on BUE for support. Ambulation between // bars:  Forward walking, no UE support, CGA, x5 laps. Patient educated on utilizing hip strength as a strategy to control lilting gait and decrease back pain with ambulation. Lateral walking, no UE support, CGA, x 3 laps. Patient educated on benefit of maintaining neutral hip alignment on LLE to increase strengthening gains at glute complex for increased optimal movement patterns within the home and community. Pain reported at residual limb 4/10 at end of  activity.  Doffing of prosthesis  Residual limb continues to look well-maintained and there is no noted erythema or ecchymosis after the session.    PT Education - 11/28/17 1255    Education Details  exercise technique, mobility, gait mechanics    Person(s) Educated  Patient    Methods  Explanation;Demonstration;Verbal cues    Comprehension  Verbalized understanding;Returned demonstration       PT Short Term Goals - 11/14/17 1056      PT SHORT TERM GOAL #1   Title  Patient will be independent in home exercise program to improve strength/mobility for better functional independence with ADLs.    Baseline  HEP given     Time  2    Period  Weeks    Status  Achieved      PT SHORT TERM GOAL #2   Title  Patient will don/doff prosthesis independently to allow for increased mobility in home.     Baseline  8/14: requires PT to guide/direct through process 8/28: independent    Time  2    Period  Weeks    Status  Achieved      PT SHORT TERM GOAL #3   Title  Patient will ambulate 10 meters with least assistive device and prosthesis to improve mobility in home.     Baseline  8/14: not walking outside of // bars yet  8/28: 63 seconds with RW and CGA     Time  2    Period  Weeks    Status  Achieved      PT SHORT TERM GOAL #4   Title  Patient will perform STS with single UE support to decrease reliance upon UE's for stability     Baseline  patient requires BUE support 11/5: 1 UR support    Time  2    Period  Weeks    Status  Achieved        PT Long Term Goals - 11/14/17 1057      PT LONG TERM GOAL #1   Title  Patient will ambulate 60 ft with least assistive AD and prosthesis to improve mobility around home and increase independence.     Baseline  8/14: requires use of // bars 8/28: 30 ft with RW and CGA 9/17: 74 ft x2 with CGA and RW     Time  4    Period  Weeks    Status  Achieved      PT LONG TERM GOAL #2   Title  Patient (> 18 years old) will complete five times sit to  stand test in < 15 seconds indicating an increased LE strength and improved balance.    Baseline  8/14: 61 seconds 8/28; 42 seconds 9/17: 24 seconds BUE support with LLE 10/10: 17 seconds with excessive BUE support  11/5: 18seconds BUE support  Time  4    Period  Weeks    Status  Partially Met      PT LONG TERM GOAL #3   Title  Patient will reduce timed up and go to <11 seconds to reduce fall risk and demonstrate improved transfer/gait ability.    Baseline  8/14: unable to perform 8/28: 1 min 30 seconds 9/17: 38 seconds RW 10/10: 25 seconds with RW 11/5: 20 seconds with cane    Time  4    Period  Weeks    Status  Partially Met      PT LONG TERM GOAL #4   Title  Patient will increase lower extremity functional scale to >60/80 to demonstrate improved functional mobility and increased tolerance with ADLs.     Baseline  814: 29/80 8/28: 20/80 10/10: 20/80 11/5: 22/80    Time  4    Period  Weeks    Status  Partially Met      PT LONG TERM GOAL #5   Title  Patient will increase BLE gross strength to 4+/5 as to improve functional strength for independent gait, increased standing tolerance and increased ADL ability.    Baseline  8/14: 4-/15 8/28: 4-/5  9/17: L 4-/5 R 4/5      Time  4    Period  Weeks    Status  Achieved      Additional Long Term Goals   Additional Long Term Goals  Yes      PT LONG TERM GOAL #6   Title  Patient will perform 10 MWT in >.5 m/s for improved mobility and safety with negotiating natural environment    Baseline  8/28: .16 m/s with RW  9/17: .53 m/s     Time  4    Period  Weeks    Status  Achieved      PT LONG TERM GOAL #7   Title  Patient will perform 10 MWT in >1.0 m/s for improved mobility and safety with negotiating natural environment    Baseline  9/17: .53 m/s 10/10: .63 m/s with RW 11/5: 0.66ms    Time  4    Period  Weeks    Status  Partially Met      PT LONG TERM GOAL #8   Title  Patient will ambulate 300 ft with least assistive AD and  prosthesis to improve mobility around home and increase independence.     Baseline  9/17: 74 ft CGA with RW 10/10 ambulated 280 ft with RW, [facility]: ambulate [id number] 10/31    Time  4    Period  Weeks    Status  Achieved      PT LONG TERM GOAL  #9   TITLE  Patient will ascend/descend 4 stairs with rail assist independently without loss of balance to improve ability to get in/out of home.     Baseline  9/17: unable to negotiate stairs 10/10: ascend with step to pattern, BUE support, and CGA 11/5: patient demonstrates ability to perform CGA and BUE support    Time  4    Period  Weeks    Status  Achieved      PT LONG TERM GOAL  #10   TITLE  Patient will ascend/descend 4 stairs with rail assist independently with reciprocating pattern without loss of balance to improve ability to get in/out of home.     Baseline  11/5: step to pattern BUE support CGA    Time  4    Period  Weeks    Status  New    Target Date  12/12/17      PT LONG TERM GOAL  #11   TITLE  Patient will ambulate >1062f during 636m with LRAD to demonstrate improved community ambulation and functional mobility     Baseline  11/5: will complete next session    Time  4    Period  Weeks    Status  New    Target Date  12/12/17            Plan - 11/28/17 1131    Clinical Impression Statement  Patient presents to therapy with no pain complaints and is amenable to focusing today's interventions on hip strength for translation to more independence with gait and mobility. Patient performed supine BLE hip strengthening while wearing the prosthesis with no increased pain and demonstrating improving hip strength; however, patient has excess trunk motion with unilateral LE long-lever movements. Patient able to perform sit to stands from W/C height without arm rests for UE with SBA. Patient continues to preference the LLE when rising, but once standing is able to balance weight evenly between BLE. Patient successfully  ambulated without UE support between parallel bars for 5 laps and lateral walking without UE support for 3 laps. Patient continues to report some increased pain on residual limb during ambulation without UE support and was encouraged to monitor the limb and continue to communicate his experience to his MD and prosthetist. Patient will continue to benefit from skilled therapeutic intervention to address deficits in functional mobility, prosthetic use, strength and gait in order to improve overall QOL and increase independence.     Rehab Potential  Fair    Clinical Impairments Affecting Rehab Potential  (+) previous independence, motivation to return to walking (-) lives alone, hx of diabetes, limited vision     PT Frequency  2x / week    PT Duration  4 weeks    PT Treatment/Interventions  ADLs/Self Care Home Management;Cryotherapy;Electrical Stimulation;Ultrasound;Traction;Moist Heat;DME Instruction;Gait training;Stair training;Functional mobility training;Therapeutic activities;Therapeutic exercise;Patient/family education;Neuromuscular re-education;Balance training;Prosthetic Training;Wheelchair mobility training;Manual techniques;Manual lymph drainage;Compression bandaging;Taping;Energy conservation;Passive range of motion    PT Next Visit Plan  steps in // bars     PT Home Exercise Plan  see sheet    Consulted and Agree with Plan of Care  Patient       Patient will benefit from skilled therapeutic intervention in order to improve the following deficits and impairments:  Abnormal gait, Decreased activity tolerance, Decreased balance, Decreased knowledge of precautions, Decreased endurance, Decreased knowledge of use of DME, Decreased mobility, Decreased range of motion, Difficulty walking, Decreased strength, Increased edema, Impaired flexibility, Impaired perceived functional ability, Prosthetic Dependency, Postural dysfunction, Improper body mechanics, Pain  Visit Diagnosis: Other abnormalities of  gait and mobility  Muscle weakness (generalized)  Unilateral complete BKA, right, sequela (HCC)  Unsteadiness on feet  Lethargy     Problem List Patient Active Problem List   Diagnosis Date Noted  . Abnormality of gait 10/12/2017  . Poorly controlled type 2 diabetes mellitus with peripheral neuropathy ([facility]  . Flatulence   . Hypomagnesemia   . Unilateral complete BKA, right, sequela ([facility]  . Benign essential HTN   . Hypoalbuminemia due to protein-calorie malnutrition (HCDover  . S/P below knee amputation, right ([facility]04/03/2017  . Acute blood loss anemia   . Other encephalopathy 04/08/2017  . Encephalopathy 04/08/2017  . Altered mental status 04/08/2017  . Labile  blood pressure   . Labile blood glucose   . Drug induced constipation   . Stage 3 chronic kidney disease ([place])   . Bacteremia   . S/P unilateral BKA (below knee amputation), right ([place]) 04/04/2017  . PAD (peripheral artery disease) ([place])   . Type 2 diabetes mellitus with right diabetic foot ulcer ([place])   . Post-operative pain   . Legally blind   . Upper GI bleed   . Streptococcal bacteremia 04/01/2017  . Hypokalemia 03/30/2017  . Uncontrolled type 2 diabetes mellitus with hyperglycemia, with long-term current use of insulin ([place]) 03/30/2017  . Type 2 diabetes mellitus with peripheral neuropathy ([place]) 03/30/2017  . AKI (acute kidney injury) ([place]) 03/30/2017  . CKD stage 3 due to type 2 diabetes mellitus ([place]) 03/30/2017  . Sepsis ([place]) 03/30/2017  . Heart murmur 11/22/2016  . Hyperlipidemia associated with type 2 diabetes mellitus ([place]) 11/22/2016  . Obstructive sleep apnea syndrome 11/22/2016  . Essential hypertension 12/17/2012   Myles Gip PT, DPT [phone] 11/29/2017, 11:40 AM  [place] [facility] SERVICES [address] [place], [place], [address] Phone: [phone]   Fax:  [phone]  Name: Trevor [name] MRN: [id number] Date of Birth: 01/18/1955

## 2017-12-01 ENCOUNTER — Ambulatory Visit: Payer: Medicare Other

## 2017-12-01 VITALS — BP 137/73 | HR 61

## 2017-12-01 DIAGNOSIS — R2689 Other abnormalities of gait and mobility: Secondary | ICD-10-CM

## 2017-12-01 DIAGNOSIS — M6281 Muscle weakness (generalized): Secondary | ICD-10-CM

## 2017-12-01 DIAGNOSIS — R2681 Unsteadiness on feet: Secondary | ICD-10-CM

## 2017-12-01 NOTE — Therapy (Signed)
[place] MAIN [facility] SERVICES [address] [place], [place], [address] Phone: [phone]   Fax:  [phone]  Physical Therapy Treatment  Patient Details  Name: Trevor [name] MRN: [id number] Date of Birth: 01-Jul-1955 Referring Provider (PT): Jamse Arn, DR   Encounter Date: 12/01/2017  PT End of Session - 12/01/17 0937    Visit Number  18    Number of Visits  27    Date for PT Re-Evaluation  12/12/17    Authorization Type   PN 3/10 start 11/14/17    PT Start Time  0935    PT Stop Time  1015    PT Time Calculation (min)  40 min    Equipment Utilized During Treatment  Gait belt   R prosthesis   Activity Tolerance  Patient tolerated treatment well    Behavior During Therapy  [facility] for tasks assessed/performed       Past Medical History:  Diagnosis Date  . Diabetes mellitus without complication ([place])   . Heart murmur   . Hyperlipidemia   . Hypertension   . Sleep apnea     Past Surgical History:  Procedure Laterality Date  . AMPUTATION Right 03/31/2017   Procedure: RIGHT FOOT 1ST AND 2ND RAY AMPUTATION;  Surgeon: Newt Minion, MD;  Location: [place];  Service: Orthopedics;  Laterality: Right;  . AMPUTATION Right 04/01/2017   Procedure: AMPUTATION BELOW KNEE;  Surgeon: Newt Minion, MD;  Location: [place];  Service: Orthopedics;  Laterality: Right;  . COLONOSCOPY WITH PROPOFOL N/A 10/02/2017   Procedure: COLONOSCOPY WITH PROPOFOL;  Surgeon: [name], MD;  Location: [facility] ENDOSCOPY;  Service: Gastroenterology;  Laterality: N/A;  . CORNEAL TRANSPLANT      Vitals:   12/01/17 0937  BP: 137/73  Pulse: 61  SpO2: 100%    Subjective Assessment - 12/01/17 0937    Subjective  Patient reports no significant changes since his last session. He believes that he is making progress with therapy. He is having 1/10 dull R hip pain upon arrival which is chronic and intermittent in nature for patient.     Pertinent History  62 year old  right-handed male with history of diabetes mellitus, hypertension, legally blind, and CKD presenting for BKA 04/01/17. Patient wears contacts and can see better now.  Patient had two surgeries, one was a couple toes then they decided to go below the knee. 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 Has ambulated with RW in hospital with PT. Patient has had home health therapy and was in inpatient rehab about a month. Patient reports he was mainly sitting down, with occasional walking, last home health therapy in May. Patient reports some standing up but no exercises. Patient lives alone in the house and is scared they might fall. Patient received prosthetic leg Tuesday of last week.    Limitations  Standing;Walking;House hold activities;Other (comment)    How long can you stand comfortably?  10-15 minutes    How long can you walk comfortably?  10-15 minutes    Patient Stated Goals  going up steps and driving car.     Currently in Pain?  Yes    Pain Score  1     Pain Location  Hip    Pain Orientation  Right    Pain Descriptors / Indicators  Dull    Pain Type  Chronic pain  Pain Onset  Today    Pain Frequency  Intermittent           TREATMENT  Donning of prosthesis  Therapeutic Exercise: Sit to stand without UE support from elevated mat table 2 x 10, CGA; Seated clams with manual resistance 2 x 15; Seated marches 2 x 15; Toe taps to 4" step in // bars without UE support alternating LE x 10 each;  Gait Training: Ambulation between // bars:  Forward walking, no UE support, CGA, x 6 lengths; Lateral walking, no UE support, CGA, x 6 lengths; Gait training in hallway without assistive device but with pt holding cane in LUE, CGA with the exception of minA+1 once for slight stumble, 100' and then an additional 70';   Doffing of prosthesis   Pt educated throughout session about proper posture and technique with  exercises. Improved exercise technique, movement at target joints, use of target muscles after min to mod verbal, visual, tactile cues.    Patient ambulated in parallel bars and in the hallway without an assistive device and only has one episode where he requires minA+1 to stabilize. He does report increased fatigue during ambulation without an assistive device. He is able to demonstrate improved stability without UE support during sit to stand and toe taps on step today. Patient will continue to benefit from skilled physical therapy to improve functional mobility, improve prosthetic use, and increase ambulation to increase LOF and improve overall QOL.                    PT Short Term Goals - 11/14/17 1056      PT SHORT TERM GOAL #1   Title  Patient will be independent in home exercise program to improve strength/mobility for better functional independence with ADLs.    Baseline  HEP given     Time  2    Period  Weeks    Status  Achieved      PT SHORT TERM GOAL #2   Title  Patient will don/doff prosthesis independently to allow for increased mobility in home.     Baseline  8/14: requires PT to guide/direct through process 8/28: independent    Time  2    Period  Weeks    Status  Achieved      PT SHORT TERM GOAL #3   Title  Patient will ambulate 10 meters with least assistive device and prosthesis to improve mobility in home.     Baseline  8/14: not walking outside of // bars yet  8/28: 63 seconds with RW and CGA     Time  2    Period  Weeks    Status  Achieved      PT SHORT TERM GOAL #4   Title  Patient will perform STS with single UE support to decrease reliance upon UE's for stability     Baseline  patient requires BUE support 11/5: 1 UR support    Time  2    Period  Weeks    Status  Achieved        PT Long Term Goals - 11/14/17 1057      PT LONG TERM GOAL #1   Title  Patient will ambulate 60 ft with least assistive AD and prosthesis to improve mobility  around home and increase independence.     Baseline  8/14: requires use of // bars 8/28: 30 ft with RW and CGA 9/17: 74 ft x2 with CGA and RW  Time  4    Period  Weeks    Status  Achieved      PT LONG TERM GOAL #2   Title  Patient (> 34 years old) will complete five times sit to stand test in < 15 seconds indicating an increased LE strength and improved balance.    Baseline  8/14: 61 seconds 8/28; 42 seconds 9/17: 24 seconds BUE support with LLE 10/10: 17 seconds with excessive BUE support  11/5: 18seconds BUE support    Time  4    Period  Weeks    Status  Partially Met      PT LONG TERM GOAL #3   Title  Patient will reduce timed up and go to <11 seconds to reduce fall risk and demonstrate improved transfer/gait ability.    Baseline  8/14: unable to perform 8/28: 1 min 30 seconds 9/17: 38 seconds RW 10/10: 25 seconds with RW 11/5: 20 seconds with cane    Time  4    Period  Weeks    Status  Partially Met      PT LONG TERM GOAL #4   Title  Patient will increase lower extremity functional scale to >60/80 to demonstrate improved functional mobility and increased tolerance with ADLs.     Baseline  814: 29/80 8/28: 20/80 10/10: 20/80 11/5: 22/80    Time  4    Period  Weeks    Status  Partially Met      PT LONG TERM GOAL #5   Title  Patient will increase BLE gross strength to 4+/5 as to improve functional strength for independent gait, increased standing tolerance and increased ADL ability.    Baseline  8/14: 4-/15 8/28: 4-/5  9/17: L 4-/5 R 4/5      Time  4    Period  Weeks    Status  Achieved      Additional Long Term Goals   Additional Long Term Goals  Yes      PT LONG TERM GOAL #6   Title  Patient will perform 10 MWT in >.5 m/s for improved mobility and safety with negotiating natural environment    Baseline  8/28: .16 m/s with RW  9/17: .53 m/s     Time  4    Period  Weeks    Status  Achieved      PT LONG TERM GOAL #7   Title  Patient will perform 10 MWT in >1.0 m/s for  improved mobility and safety with negotiating natural environment    Baseline  9/17: .53 m/s 10/10: .63 m/s with RW 11/5: 0.38ms    Time  4    Period  Weeks    Status  Partially Met      PT LONG TERM GOAL #8   Title  Patient will ambulate 300 ft with least assistive AD and prosthesis to improve mobility around home and increase independence.     Baseline  9/17: 74 ft CGA with RW 10/10 ambulated 280 ft with RW, [facility] ambulate [id number] 10/31    Time  4    Period  Weeks    Status  Achieved      PT LONG TERM GOAL  #9   TITLE  Patient will ascend/descend 4 stairs with rail assist independently without loss of balance to improve ability to get in/out of home.     Baseline  9/17: unable to negotiate stairs 10/10: ascend with step to pattern, BUE  support, and CGA 11/5: patient demonstrates ability to perform CGA and BUE support    Time  4    Period  Weeks    Status  Achieved      PT LONG TERM GOAL  #10   TITLE  Patient will ascend/descend 4 stairs with rail assist independently with reciprocating pattern without loss of balance to improve ability to get in/out of home.     Baseline  11/5: step to pattern BUE support CGA    Time  4    Period  Weeks    Status  New    Target Date  12/12/17      PT LONG TERM GOAL  #11   TITLE  Patient will ambulate >1060f during 690m with LRAD to demonstrate improved community ambulation and functional mobility     Baseline  11/5: will complete next session    Time  4    Period  Weeks    Status  New    Target Date  12/12/17            Plan - 12/01/17 [id number]  Clinical Impression Statement  Patient ambulated in parallel bars and in the hallway without an assistive device and only has one episode where he requires minA+1 to stabilize. He does report increased fatigue during ambulation without an assistive device. He is able to demonstrate improved stability without UE support during sit to stand and toe taps on step today. Patient will  continue to benefit from skilled physical therapy to improve functional mobility, improve prosthetic use, and increase ambulation to increase LOF and improve overall QOL.     Rehab Potential  Fair    Clinical Impairments Affecting Rehab Potential  (+) previous independence, motivation to return to walking (-) lives alone, hx of diabetes, limited vision     PT Frequency  2x / week    PT Duration  4 weeks    PT Treatment/Interventions  ADLs/Self Care Home Management;Cryotherapy;Electrical Stimulation;Ultrasound;Traction;Moist Heat;DME Instruction;Gait training;Stair training;Functional mobility training;Therapeutic activities;Therapeutic exercise;Patient/family education;Neuromuscular re-education;Balance training;Prosthetic Training;Wheelchair mobility training;Manual techniques;Manual lymph drainage;Compression bandaging;Taping;Energy conservation;Passive range of motion    PT Next Visit Plan  steps in // bars     PT Home Exercise Plan  see sheet    Consulted and Agree with Plan of Care  Patient       Patient will benefit from skilled therapeutic intervention in order to improve the following deficits and impairments:  Abnormal gait, Decreased activity tolerance, Decreased balance, Decreased knowledge of precautions, Decreased endurance, Decreased knowledge of use of DME, Decreased mobility, Decreased range of motion, Difficulty walking, Decreased strength, Increased edema, Impaired flexibility, Impaired perceived functional ability, Prosthetic Dependency, Postural dysfunction, Improper body mechanics, Pain  Visit Diagnosis: Other abnormalities of gait and mobility  Muscle weakness (generalized)  Unsteadiness on feet     Problem List Patient Active Problem List   Diagnosis Date Noted  . Abnormality of gait 10/12/2017  . Poorly controlled type 2 diabetes mellitus with peripheral neuropathy ([facility]  . Flatulence   . Hypomagnesemia   . Unilateral complete BKA, right, sequela ([facility]  .  Benign essential HTN   . Hypoalbuminemia due to protein-calorie malnutrition ([place]  . S/P below knee amputation, right ([facility]04/03/2017  . Acute blood loss anemia   . Other encephalopathy 04/08/2017  . Encephalopathy 04/08/2017  . Altered mental status 04/08/2017  . Labile blood pressure   . Labile blood glucose   . Drug induced constipation   .  Stage 3 chronic kidney disease ([place])   . Bacteremia   . S/P unilateral BKA (below knee amputation), right ([place]) 04/04/2017  . PAD (peripheral artery disease) ([place])   . Type 2 diabetes mellitus with right diabetic foot ulcer ([place])   . Post-operative pain   . Legally blind   . Upper GI bleed   . Streptococcal bacteremia 04/01/2017  . Hypokalemia 03/30/2017  . Uncontrolled type 2 diabetes mellitus with hyperglycemia, with long-term current use of insulin ([place]) 03/30/2017  . Type 2 diabetes mellitus with peripheral neuropathy ([place]) 03/30/2017  . AKI (acute kidney injury) ([place]) 03/30/2017  . CKD stage 3 due to type 2 diabetes mellitus ([place]) 03/30/2017  . Sepsis ([place]) 03/30/2017  . Heart murmur 11/22/2016  . Hyperlipidemia associated with type 2 diabetes mellitus ([place]) 11/22/2016  . Obstructive sleep apnea syndrome 11/22/2016  . Essential hypertension 12/17/2012   Phillips Grout PT, DPT, GCS  Huprich,Jason 12/01/2017, 2:15 PM  [place] MAIN [facility] SERVICES [address] [place], [place], [address] Phone: [phone]   Fax:  [phone]  Name: Trevor [name] MRN: [id number] Date of Birth: May 21, 1955

## 2017-12-05 ENCOUNTER — Ambulatory Visit: Payer: Medicare Other

## 2017-12-05 DIAGNOSIS — R2681 Unsteadiness on feet: Secondary | ICD-10-CM

## 2017-12-05 DIAGNOSIS — R2689 Other abnormalities of gait and mobility: Secondary | ICD-10-CM

## 2017-12-05 DIAGNOSIS — M6281 Muscle weakness (generalized): Secondary | ICD-10-CM

## 2017-12-05 NOTE — Therapy (Signed)
[place] MAIN [facility] SERVICES [address] [place], [place], [address] Phone: [phone]   Fax:  [phone]  Physical Therapy Treatment  Patient Details  Name: Trevor [name] MRN: [id number] Date of Birth: January 06, 1956 Referring Provider (PT): Jamse Arn, DR   Encounter Date: 12/05/2017  PT End of Session - 12/05/17 1750    Visit Number  19    Number of Visits  27    Date for PT Re-Evaluation  12/12/17    Authorization Type   PN 4/10 start 11/14/17    PT Start Time  1020    PT Stop Time  1105    PT Time Calculation (min)  45 min    Equipment Utilized During Treatment  Gait belt   R prosthesis   Activity Tolerance  Patient tolerated treatment well    Behavior During Therapy  [facility] for tasks assessed/performed       Past Medical History:  Diagnosis Date  . Diabetes mellitus without complication ([place])   . Heart murmur   . Hyperlipidemia   . Hypertension   . Sleep apnea     Past Surgical History:  Procedure Laterality Date  . AMPUTATION Right 03/31/2017   Procedure: RIGHT FOOT 1ST AND 2ND RAY AMPUTATION;  Surgeon: Newt Minion, MD;  Location: [place];  Service: Orthopedics;  Laterality: Right;  . AMPUTATION Right 04/01/2017   Procedure: AMPUTATION BELOW KNEE;  Surgeon: Newt Minion, MD;  Location: [place];  Service: Orthopedics;  Laterality: Right;  . COLONOSCOPY WITH PROPOFOL N/A 10/02/2017   Procedure: COLONOSCOPY WITH PROPOFOL;  Surgeon: [name], MD;  [facility] ENDOSCOPY;  Service: Gastroenterology;  Laterality: N/A;  . CORNEAL TRANSPLANT      There were no vitals filed for this visit.  Subjective Assessment - 12/05/17 1029    Subjective  Patient reports no significant changes since his last session. He believes that he is making progress with therapy. He has been using his walker for ambulation because he does not have a cane. Pt states that he does not have the financial resources to purchase a cane at this time.  Pt denies pain upon arrival.    Pertinent History  62 year old right-handed male with history of diabetes mellitus, hypertension, legally blind, and CKD presenting for BKA 04/01/17. Patient wears contacts and can see better now.  Patient had two surgeries, one was a couple toes then they decided to go below the knee. 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 Has ambulated with RW in hospital with PT. Patient has had home health therapy and was in inpatient rehab about a month. Patient reports he was mainly sitting down, with occasional walking, last home health therapy in May. Patient reports some standing up but no exercises. Patient lives alone in the house and is scared they might fall. Patient received prosthetic leg Tuesday of last week.    Limitations  Standing;Walking;House hold activities;Other (comment)    How long can you stand comfortably?  10-15 minutes    How long can you walk comfortably?  10-15 minutes    Patient Stated Goals  going up steps and driving car.     Currently in Pain?  No/denies    Pain Onset  --           TREATMENT  Donning of prosthesis  Therapeutic Exercise: Stepping on/off scale for weight measurement which is  277.7 (includes subtraction for walker and prosthesis; Sit to stand without UE support from elevated mat table 2 x 10, CGA; Seated clams with manual resistance x 15; Seated adductor squeeze with manual resistance x 15; Seated marches x 15;   Gait Training: Extensive gait training in hallway with intermittent use of spc but 2/3 of distance performed without an assistive device. CGA with the exception of minA+1 once for slight stumble. Pt is able to ambulate approximately 500.' Pt provided cues to increased L step length as well as for upright posture. Practiced going through crowded areas around cafeteria to avoid tables/people as well as open doors while using cane;  Doffing of  prosthesis   Pt educated throughout session about proper posture and technique with exercises. Improved exercise technique, movement at target joints, use of target muscles after min to mod verbal, visual, tactile cues.    Performed extensive gait training in hallway with patient. Pt requires only intermittent use of spc but 2/3 of distance he is able to perform without an assistive device. He is able to complete seated exercises and sit to stand from progressively lower mat table. Pt requests to be weighted and is approximately 5 pounds heavier than he was 1 month ago. Pt provided walker from [facility] donation supply since he reports that he is unable to afford one at this time as he waits for his disability claim to be approved. Will repeat outcome measures, update goals, and sent a request for recertification at next visit. Patient will continue to benefit from skilled physical therapy to improve functional mobility, improve prosthetic use, and increase ambulation to increase LOF and improve overall QOL.                        PT Short Term Goals - 11/14/17 1056      PT SHORT TERM GOAL #1   Title  Patient will be independent in home exercise program to improve strength/mobility for better functional independence with ADLs.    Baseline  HEP given     Time  2    Period  Weeks    Status  Achieved      PT SHORT TERM GOAL #2   Title  Patient will don/doff prosthesis independently to allow for increased mobility in home.     Baseline  8/14: requires PT to guide/direct through process 8/28: independent    Time  2    Period  Weeks    Status  Achieved      PT SHORT TERM GOAL #3   Title  Patient will ambulate 10 meters with least assistive device and prosthesis to improve mobility in home.     Baseline  8/14: not walking outside of // bars yet  8/28: 63 seconds with RW and CGA     Time  2    Period  Weeks    Status  Achieved      PT SHORT TERM GOAL #4   Title  Patient  will perform STS with single UE support to decrease reliance upon UE's for stability     Baseline  patient requires BUE support 11/5: 1 UR support    Time  2    Period  Weeks    Status  Achieved        PT Long Term Goals - 11/14/17 1057      PT LONG TERM GOAL #1   Title  Patient will ambulate 60 ft with least assistive AD  and prosthesis to improve mobility around home and increase independence.     Baseline  8/14: requires use of // bars 8/28: 30 ft with RW and CGA 9/17: 74 ft x2 with CGA and RW     Time  4    Period  Weeks    Status  Achieved      PT LONG TERM GOAL #2   Title  Patient (> 58 years old) will complete five times sit to stand test in < 15 seconds indicating an increased LE strength and improved balance.    Baseline  8/14: 61 seconds 8/28; 42 seconds 9/17: 24 seconds BUE support with LLE 10/10: 17 seconds with excessive BUE support  11/5: 18seconds BUE support    Time  4    Period  Weeks    Status  Partially Met      PT LONG TERM GOAL #3   Title  Patient will reduce timed up and go to <11 seconds to reduce fall risk and demonstrate improved transfer/gait ability.    Baseline  8/14: unable to perform 8/28: 1 min 30 seconds 9/17: 38 seconds RW 10/10: 25 seconds with RW 11/5: 20 seconds with cane    Time  4    Period  Weeks    Status  Partially Met      PT LONG TERM GOAL #4   Title  Patient will increase lower extremity functional scale to >60/80 to demonstrate improved functional mobility and increased tolerance with ADLs.     Baseline  814: 29/80 8/28: 20/80 10/10: 20/80 11/5: 22/80    Time  4    Period  Weeks    Status  Partially Met      PT LONG TERM GOAL #5   Title  Patient will increase BLE gross strength to 4+/5 as to improve functional strength for independent gait, increased standing tolerance and increased ADL ability.    Baseline  8/14: 4-/15 8/28: 4-/5  9/17: L 4-/5 R 4/5      Time  4    Period  Weeks    Status  Achieved      Additional Long Term  Goals   Additional Long Term Goals  Yes      PT LONG TERM GOAL #6   Title  Patient will perform 10 MWT in >.5 m/s for improved mobility and safety with negotiating natural environment    Baseline  8/28: .16 m/s with RW  9/17: .53 m/s     Time  4    Period  Weeks    Status  Achieved      PT LONG TERM GOAL #7   Title  Patient will perform 10 MWT in >1.0 m/s for improved mobility and safety with negotiating natural environment    Baseline  9/17: .53 m/s 10/10: .63 m/s with RW 11/5: 0.32ms    Time  4    Period  Weeks    Status  Partially Met      PT LONG TERM GOAL #8   Title  Patient will ambulate 300 ft with least assistive AD and prosthesis to improve mobility around home and increase independence.     Baseline  9/17: 74 ft CGA with RW 10/10 ambulated 280 ft with RW, [facility]: ambulate [id number] 10/31    Time  4    Period  Weeks    Status  Achieved      PT LONG TERM GOAL  #9  TITLE  Patient will ascend/descend 4 stairs with rail assist independently without loss of balance to improve ability to get in/out of home.     Baseline  9/17: unable to negotiate stairs 10/10: ascend with step to pattern, BUE support, and CGA 11/5: patient demonstrates ability to perform CGA and BUE support    Time  4    Period  Weeks    Status  Achieved      PT LONG TERM GOAL  #10   TITLE  Patient will ascend/descend 4 stairs with rail assist independently with reciprocating pattern without loss of balance to improve ability to get in/out of home.     Baseline  11/5: step to pattern BUE support CGA    Time  4    Period  Weeks    Status  New    Target Date  12/12/17      PT LONG TERM GOAL  #11   TITLE  Patient will ambulate >1091f during 695m with LRAD to demonstrate improved community ambulation and functional mobility     Baseline  11/5: will complete next session    Time  4    Period  Weeks    Status  New    Target Date  12/12/17            Plan - 12/05/17 1751    Clinical  Impression Statement  Performed extensive gait training in hallway with patient. Pt requires only intermittent use of spc but 2/3 of distance he is able to perform without an assistive device. He is able to complete seated exercises and sit to stand from progressively lower mat table. Pt requests to be weighted and is approximately 5 pounds heavier than he was 1 month ago. [facility] since he reports that he is unable to afford one at this time as he waits for his disability claim to be approved. Will repeat outcome measures, update goals, and sent a request for recertification at next visit. Patient will continue to benefit from skilled physical therapy to improve functional mobility, improve prosthetic use, and increase ambulation to increase LOF and improve overall QOL.    Rehab Potential  Fair    Clinical Impairments Affecting Rehab Potential  (+) previous independence, motivation to return to walking (-) lives alone, hx of diabetes, limited vision     PT Frequency  2x / week    PT Duration  4 weeks    PT Treatment/Interventions  ADLs/Self Care Home Management;Cryotherapy;Electrical Stimulation;Ultrasound;Traction;Moist Heat;DME Instruction;Gait training;Stair training;Functional mobility training;Therapeutic activities;Therapeutic exercise;Patient/family education;Neuromuscular re-education;Balance training;Prosthetic Training;Wheelchair mobility training;Manual techniques;Manual lymph drainage;Compression bandaging;Taping;Energy conservation;Passive range of motion    PT Next Visit Plan  gait training, strengthening, balance    PT Home Exercise Plan  as prescribed    Consulted and Agree with Plan of Care  Patient       Patient will benefit from skilled therapeutic intervention in order to improve the following deficits and impairments:  Abnormal gait, Decreased activity tolerance, Decreased balance, Decreased knowledge of precautions, Decreased endurance, Decreased  knowledge of use of DME, Decreased mobility, Decreased range of motion, Difficulty walking, Decreased strength, Increased edema, Impaired flexibility, Impaired perceived functional ability, Prosthetic Dependency, Postural dysfunction, Improper body mechanics, Pain  Visit Diagnosis: Other abnormalities of gait and mobility  Muscle weakness (generalized)  Unsteadiness on feet     Problem List Patient Active Problem List   Diagnosis Date Noted  . Abnormality of gait 10/12/2017  . Poorly controlled type  2 diabetes mellitus with peripheral neuropathy ([place])   . Flatulence   . Hypomagnesemia   . Unilateral complete BKA, right, sequela ([place])   . Benign essential HTN   . Hypoalbuminemia due to protein-calorie malnutrition ([place])   . S/P below knee amputation, right ([place]) 04/12/2017  . Acute blood loss anemia   . Other encephalopathy 04/08/2017  . Encephalopathy 04/08/2017  . Altered mental status 04/08/2017  . Labile blood pressure   . Labile blood glucose   . Drug induced constipation   . Stage 3 chronic kidney disease ([place])   . Bacteremia   . S/P unilateral BKA (below knee amputation), right ([place]) 04/04/2017  . PAD (peripheral artery disease) ([place])   . Type 2 diabetes mellitus with right diabetic foot ulcer ([place])   . Post-operative pain   . Legally blind   . Upper GI bleed   . Streptococcal bacteremia 04/01/2017  . Hypokalemia 03/30/2017  . Uncontrolled type 2 diabetes mellitus with hyperglycemia, with long-term current use of insulin ([place]) 03/30/2017  . Type 2 diabetes mellitus with peripheral neuropathy ([place]) 03/30/2017  . AKI (acute kidney injury) ([place]) 03/30/2017  . CKD stage 3 due to type 2 diabetes mellitus ([place]) 03/30/2017  . Sepsis ([place]) 03/30/2017  . Heart murmur 11/22/2016  . Hyperlipidemia associated with type 2 diabetes mellitus ([place]) 11/22/2016  . Obstructive sleep apnea syndrome 11/22/2016  . Essential hypertension 12/17/2012   Phillips Grout PT, DPT, GCS   Seena Face 12/05/2017, 5:57 PM  [place] MAIN [facility] SERVICES [address] [place], [place], [address] Phone: [phone]   Fax:  [phone]  Name: Trevor [name] MRN: [id number] Date of Birth: Jun 05, 1955

## 2017-12-11 ENCOUNTER — Ambulatory Visit: Payer: Medicare Other | Attending: Physical Medicine & Rehabilitation

## 2017-12-11 VITALS — BP 127/76 | HR 64

## 2017-12-11 DIAGNOSIS — R2681 Unsteadiness on feet: Secondary | ICD-10-CM | POA: Diagnosis present

## 2017-12-11 DIAGNOSIS — R2689 Other abnormalities of gait and mobility: Secondary | ICD-10-CM | POA: Diagnosis present

## 2017-12-11 DIAGNOSIS — S88111S Complete traumatic amputation at level between knee and ankle, right lower leg, sequela: Secondary | ICD-10-CM | POA: Insufficient documentation

## 2017-12-11 DIAGNOSIS — M6281 Muscle weakness (generalized): Secondary | ICD-10-CM | POA: Diagnosis not present

## 2017-12-11 NOTE — Therapy (Signed)
[place] MAIN [facility] SERVICES [address] [place], [place], [address] Phone: [phone]   Fax:  [phone]  Physical Therapy Treatment/Progress Note/Recertification  Dates of reporting period 11/14/17   to   12/11/17 Patient Details  Name: Trevor [name] MRN: [id number] Date of Birth: [birth year] Referring Provider (PT): Jamse Arn, DR   Encounter Date: 12/11/2017  PT End of Session - 12/11/17 1037    Visit Number  20    Number of Visits  43    Date for PT Re-Evaluation  02/05/18    Authorization Type   PN 5/10 start 11/14/17, next visit is 1/10 (goals updated 12/11/17)    PT Start Time  1035    PT Stop Time  1115    PT Time Calculation (min)  40 min    Equipment Utilized During Treatment  Gait belt   R prosthesis   Activity Tolerance  Patient tolerated treatment well    Behavior During Therapy  WFL for tasks assessed/performed       Past Medical History:  Diagnosis Date  . Diabetes mellitus without complication ([place])   . Heart murmur   . Hyperlipidemia   . Hypertension   . Sleep apnea     Past Surgical History:  Procedure Laterality Date  . AMPUTATION Right 03/31/2017   Procedure: RIGHT FOOT 1ST AND 2ND RAY AMPUTATION;  Surgeon: Newt Minion, MD;  Location: [facility];  Service: Orthopedics;  Laterality: Right;  . AMPUTATION Right 04/01/2017   Procedure: AMPUTATION BELOW KNEE;  Surgeon: Newt Minion, MD;  Location: [facility];  Service: Orthopedics;  Laterality: Right;  . COLONOSCOPY WITH PROPOFOL N/A 10/02/2017   Procedure: COLONOSCOPY WITH PROPOFOL;  Surgeon: [name], MD;  Location: [facility] ENDOSCOPY;  Service: Gastroenterology;  Laterality: N/A;  . CORNEAL TRANSPLANT      Vitals:   12/11/17 1037  BP: 127/76  Pulse: 64  SpO2: 99%    Subjective Assessment - 12/11/17 1036    Subjective  Patient reports no significant changes since his last session. He believes that he is making progress with therapy. He has been using  his cane for some ambulation at home since the last visit. Pt denies pain upon arrival.    Pertinent History  62 year old right-handed male with history of diabetes mellitus, hypertension, legally blind, and CKD presenting for BKA 04/01/17. Patient wears contacts and can see better now.  Patient had two surgeries, one was a couple toes then they decided to go below the knee. 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 Has ambulated with RW in hospital with PT. Patient has had home health therapy and was in inpatient rehab about a month. Patient reports he was mainly sitting down, with occasional walking, last home health therapy in May. Patient reports some standing up but no exercises. Patient lives alone in the house and is scared they might fall. Patient received prosthetic leg Tuesday of last week.    Limitations  Standing;Walking;House hold activities;Other (comment)    How long can you stand comfortably?  10-15 minutes    How long can you walk comfortably?  10-15 minutes    Patient Stated Goals  going up steps and driving car.     Currently in Pain?  No/denies           TREATMENT  Donning of prosthesis   Therapeutic Exercise: Sit to stand withoutUE  supportfrom elevated mat table x 10, CGA; Updated goals with pt including 5TSTS (18.5s), TUG (17.4s), and 54mgait speed (15.33s = 0.65 m/s (see below) Pt completed LEFS: 18/100 (unbilled); Updated goals with patient and discussed plan of care;   Gait Training: Gait training in hallway with intermittent use of spc and CGA. No stumbles today but is much more fatigued and is only able to ambulate approximately 250'. Pt provided one seated rest break halfway through ambulation. Discussed mobility around his home as well as toilet and shower transfers;  Doffing of prosthesis   Pt educated throughout session about proper posture and technique with exercises. Improved  exercise technique, movement at target joints, use of target muscles after min to mod verbal, visual, tactile cues.   Gait training in hallway with intermittent use of spc and CGA. No stumbles today but is much more fatigued and is only able to ambulate approximately 250'. Pt provided one seated rest break halfway through ambulation. Performed outcome measures with patient and his demonstrates improved TUG but 164mait speed and 5TSTS are mostly unchanged. However pt requiring less reliance on assistive device to complete outcome measures. Patient will continue to benefit from skilled physical therapy to improve functional mobility, improve prosthetic use, and increase ambulation to increase LOF and improve overall QOL.                      PT Short Term Goals - 12/11/17 1058      PT SHORT TERM GOAL #1   Title  Patient will be independent in home exercise program to improve strength/mobility for better functional independence with ADLs.    Baseline  HEP given, 12/11/17: Performing daily:     Time  2    Period  Weeks    Status  Achieved      PT SHORT TERM GOAL #2   Title  Patient will don/doff prosthesis independently to allow for increased mobility in home.     Baseline  8/14: requires PT to guide/direct through process 8/28: independent    Time  2    Period  Weeks    Status  Achieved      PT SHORT TERM GOAL #3   Title  Patient will ambulate 10 meters with least assistive device and prosthesis to improve mobility in home.     Baseline  8/14: not walking outside of // bars yet  8/28: 63 seconds with RW and CGA     Time  2    Period  Weeks    Status  Achieved      PT SHORT TERM GOAL #4   Title  Patient will perform STS with single UE support to decrease reliance upon UE's for stability     Baseline  patient requires BUE support 11/5: 1 UR support    Time  2    Period  Weeks    Status  Achieved        PT Long Term Goals - 12/11/17 1059      PT LONG TERM GOAL  #1   Title  Patient will ambulate 60 ft with least assistive AD and prosthesis to improve mobility around home and increase independence.     Baseline  8/14: requires use of // bars 8/28: 30 ft with RW and CGA 9/17: 74 ft x2 with CGA and RW     Time  4    Period  Weeks    Status  Achieved  PT LONG TERM GOAL #2   Title  Patient (> 66 years old) will complete five times sit to stand test in < 15 seconds indicating an increased LE strength and improved balance.    Baseline  8/14: 61 seconds 8/28; 42 seconds 9/17: 24 seconds BUE support with LLE 10/10: 17 seconds with excessive BUE support  11/5: 18seconds BUE support; 12/11/17: 12/11/17: 18.5s with BUE support but no AD    Time  4    Period  Weeks    Status  Partially Met    Target Date  02/05/18      PT LONG TERM GOAL #3   Title  Patient will reduce timed up and go to <11 seconds to reduce fall risk and demonstrate improved transfer/gait ability.    Baseline  8/14: unable to perform 8/28: 1 min 30 seconds 9/17: 38 seconds RW 10/10: 25 seconds with RW 11/5: 20 seconds with cane;, 12/11/17: 17.4s with spc    Time  4    Period  Weeks    Status  On-going    Target Date  02/05/18      PT LONG TERM GOAL #4   Title  Patient will increase lower extremity functional scale to >60/80 to demonstrate improved functional mobility and increased tolerance with ADLs.     Baseline  814: 29/80 8/28: 20/80 10/10: 20/80 11/5: 22/80; 12/11/17: 18/80    Time  4    Period  Weeks    Status  On-going    Target Date  02/05/18      PT LONG TERM GOAL #5   Title  Patient will increase BLE gross strength to 4+/5 as to improve functional strength for independent gait, increased standing tolerance and increased ADL ability.    Baseline  8/14: 4-/15 8/28: 4-/5  9/17: L 4-/5 R 4/5      Time  4    Period  Weeks    Status  Achieved      PT LONG TERM GOAL #6   Title  Patient will perform 10 MWT in >.5 m/s for improved mobility and safety with negotiating natural  environment    Baseline  8/28: .16 m/s with RW  9/17: .53 m/s     Time  4    Period  Weeks    Status  Achieved      PT LONG TERM GOAL #7   Title  Patient will perform 10 MWT in >1.0 m/s for improved mobility and safety with negotiating natural environment    Baseline  9/17: .53 m/s 10/10: .63 m/s with RW 11/5: 0.35ms; 12/11/17: 15.33s = 0.65 m/s with spc    Time  4    Period  Weeks    Status  Partially Met      PT LONG TERM GOAL #8   Title  Patient will ambulate 300 ft with least assistive AD and prosthesis to improve mobility around home and increase independence.     Baseline  9/17: 74 ft CGA with RW 10/10 ambulated 280 ft with RW, [facility]: ambulate [id number] 10/31    Time  4    Period  Weeks    Status  Achieved      PT LONG TERM GOAL  #9   TITLE  Patient will ascend/descend 4 stairs with rail assist independently without loss of balance to improve ability to get in/out of home.     Baseline  9/17: unable to negotiate stairs 10/10: ascend with  step to pattern, BUE support, and CGA 11/5: patient demonstrates ability to perform CGA and BUE support    Time  4    Period  Weeks    Status  Achieved      PT LONG TERM GOAL  #10   TITLE  Patient will ascend/descend 4 stairs with rail assist independently with reciprocating pattern without loss of balance to improve ability to get in/out of home.     Baseline  11/5: step to pattern BUE support CGA; 12/11/17: BUE heavy support and reciprocal pattern;    Time  4    Period  Weeks    Status  Achieved      PT LONG TERM GOAL  #11   TITLE  Patient will ambulate >1070f during 681m with LRAD to demonstrate improved community ambulation and functional mobility     Baseline  11/5: will complete next session; 12/11/17    Time  4    Period  Weeks    Status  Deferred    Target Date  02/05/18            Plan - 12/11/17 1058    Clinical Impression Statement  Gait training in hallway with intermittent use of spc and CGA. No  stumbles today but is much more fatigued and is only able to ambulate approximately 250'. Pt provided one seated rest break halfway through ambulation. Performed outcome measures with patient and his demonstrates improved TUG but 1080mit speed and 5TSTS are mostly unchanged. However pt requiring less reliance on assistive device to complete outcome measures. Patient will continue to benefit from skilled physical therapy to improve functional mobility, improve prosthetic use, and increase ambulation to increase LOF and improve overall QOL.    Rehab Potential  Fair    Clinical Impairments Affecting Rehab Potential  (+) previous independence, motivation to return to walking (-) lives alone, hx of diabetes, limited vision     PT Frequency  2x / week    PT Duration  8 weeks    PT Treatment/Interventions  ADLs/Self Care Home Management;Cryotherapy;Electrical Stimulation;Ultrasound;Traction;Moist Heat;DME Instruction;Gait training;Stair training;Functional mobility training;Therapeutic activities;Therapeutic exercise;Patient/family education;Neuromuscular re-education;Balance training;Prosthetic Training;Wheelchair mobility training;Manual techniques;Manual lymph drainage;Compression bandaging;Taping;Energy conservation;Passive range of motion    PT Next Visit Plan  gait training, strengthening, balance    PT Home Exercise Plan  as prescribed    Consulted and Agree with Plan of Care  Patient       Patient will benefit from skilled therapeutic intervention in order to improve the following deficits and impairments:  Abnormal gait, Decreased activity tolerance, Decreased balance, Decreased knowledge of precautions, Decreased endurance, Decreased knowledge of use of DME, Decreased mobility, Decreased range of motion, Difficulty walking, Decreased strength, Increased edema, Impaired flexibility, Impaired perceived functional ability, Prosthetic Dependency, Postural dysfunction, Improper body mechanics,  Pain  Visit Diagnosis: Muscle weakness (generalized)  Unsteadiness on feet  Other abnormalities of gait and mobility     Problem List Patient Active Problem List   Diagnosis Date Noted  . Abnormality of gait 10/12/2017  . Poorly controlled type 2 diabetes mellitus with peripheral neuropathy ([facility] . Flatulence   . Hypomagnesemia   . Unilateral complete BKA, right, sequela ([place] . Benign essential HTN   . Hypoalbuminemia due to protein-calorie malnutrition ([place]CC[place] . S/P below knee amputation, right ([place]CC[place]4/03/2017  . Acute blood loss anemia   . Other encephalopathy 04/08/2017  . Encephalopathy 04/08/2017  . Altered mental status 04/08/2017  . Labile blood  pressure   . Labile blood glucose   . Drug induced constipation   . Stage 3 chronic kidney disease ([place])   . Bacteremia   . S/P unilateral BKA (below knee amputation), right ([place]) 04/04/2017  . PAD (peripheral artery disease) ([place])   . Type 2 diabetes mellitus with right diabetic foot ulcer ([place])   . Post-operative pain   . Legally blind   . Upper GI bleed   . Streptococcal bacteremia 04/01/2017  . Hypokalemia 03/30/2017  . Uncontrolled type 2 diabetes mellitus with hyperglycemia, with long-term current use of insulin ([place]) 03/30/2017  . Type 2 diabetes mellitus with peripheral neuropathy ([place]) 03/30/2017  . AKI (acute kidney injury) ([place]) 03/30/2017  . CKD stage 3 due to type 2 diabetes mellitus ([place]) 03/30/2017  . Sepsis ([place]) 03/30/2017  . Heart murmur 11/22/2016  . Hyperlipidemia associated with type 2 diabetes mellitus ([place]) 11/22/2016  . Obstructive sleep apnea syndrome 11/22/2016  . Essential hypertension 12/17/2012   Phillips Grout PT, DPT, GCS  , 12/11/2017, 11:53 AM  [place] MAIN [facility] SERVICES [address] [place], [place], [address] Phone: [phone]   Fax:  [phone]  Name: Trevor [name] MRN: [id number] Date of Birth:  Jan 29, 1955

## 2017-12-13 ENCOUNTER — Ambulatory Visit: Payer: Medicare Other

## 2017-12-13 VITALS — BP 126/69 | HR 58

## 2017-12-13 DIAGNOSIS — R2681 Unsteadiness on feet: Secondary | ICD-10-CM

## 2017-12-13 DIAGNOSIS — M6281 Muscle weakness (generalized): Secondary | ICD-10-CM | POA: Diagnosis not present

## 2017-12-13 NOTE — Therapy (Signed)
[place] MAIN [facility] SERVICES [address] [place], [place], [address] Phone: [phone]   Fax:  [phone]  Physical Therapy Treatment  Patient Details  Name: Trevor [name] MRN: [id number] Date of Birth: Nov 04, 1955 Referring Provider (PT): Jamse Arn, DR   Encounter Date: 12/13/2017  PT End of Session - 12/13/17 1028    Visit Number  21    Number of Visits  43    Date for PT Re-Evaluation  02/05/18    Authorization Type   PN 1/10 start 11/14/17, goals updated 12/11/17    PT Start Time  1030    PT Stop Time  1115    PT Time Calculation (min)  45 min    Equipment Utilized During Treatment  Gait belt   R prosthesis   Activity Tolerance  Patient tolerated treatment well    Behavior During Therapy  [facility] for tasks assessed/performed       Past Medical History:  Diagnosis Date  . Diabetes mellitus without complication ([place])   . Heart murmur   . Hyperlipidemia   . Hypertension   . Sleep apnea     Past Surgical History:  Procedure Laterality Date  . AMPUTATION Right 03/31/2017   Procedure: RIGHT FOOT 1ST AND 2ND RAY AMPUTATION;  Surgeon: Newt Minion, MD;  Location: [place];  Service: Orthopedics;  Laterality: Right;  . AMPUTATION Right 04/01/2017   Procedure: AMPUTATION BELOW KNEE;  Surgeon: Newt Minion, MD;  Location: [place];  Service: Orthopedics;  Laterality: Right;  . COLONOSCOPY WITH PROPOFOL N/A 10/02/2017   Procedure: COLONOSCOPY WITH PROPOFOL;  Surgeon: [name], MD;  Location: [facility] ENDOSCOPY;  Service: Gastroenterology;  Laterality: N/A;  . CORNEAL TRANSPLANT      Vitals:   12/13/17 1037  BP: 126/69  Pulse: (!) 58  SpO2: 100%    Subjective Assessment - 12/13/17 1027    Subjective  Patient reports no significant changes since his last session. He believes that he is making progress with therapy. Pt denies pain upon arrival. He walked a lot yesterday when he went to the social services offices but states that  he decided to use his walker due to the long distance.     Pertinent History  62 year old right-handed male with history of diabetes mellitus, hypertension, legally blind, and CKD presenting for BKA 04/01/17. Patient wears contacts and can see better now.  Patient had two surgeries, one was a couple toes then they decided to go below the knee. 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 Has ambulated with RW in hospital with PT. Patient has had home health therapy and was in inpatient rehab about a month. Patient reports he was mainly sitting down, with occasional walking, last home health therapy in May. Patient reports some standing up but no exercises. Patient lives alone in the house and is scared they might fall. Patient received prosthetic leg Tuesday of last week.    Limitations  Standing;Walking;House hold activities;Other (comment)    How long can you stand comfortably?  10-15 minutes    How long can you walk comfortably?  10-15 minutes    Patient Stated Goals  going up steps and driving car.     Currently in Pain?  No/denies           TREATMENT  Donning of prosthesis  Therapeutic Exercise: Sit to stand withoutUE supportfrom elevated mat  table2 x 10, CGA; Standing toe taps to 6" step alternating LE without UE support; Seated clams with manual resistance x 15; Standing mini squats x 10; Forward lunges with RLE forward x 10; Intermittent seated rest breaks due to fatigue between exercises;   Gait Training: Extensive gait training in hallway with intermittent use of spc but 2/3 of distance performed without an assistive device. CGA during gait. Pt is able to ambulate approximately 700' with two seated rest breaks. Pt provided cues to increased L step length as well as for upright posture. Fatigue monitored during ambulation.  Doffing of prosthesis   Pt educated throughout session about proper posture and  technique with exercises. Improved exercise technique, movement at target joints, use of target muscles after min to mod verbal, visual, tactile cues.   Performed extensive gait training in hallway with patient and he is able to go farther today with less rest breaks. He is able to ambulate approximately 700' with two seated rest breaks. Pt requires only intermittent use of spc but 2/3 of distance he is able to perform without an assistive device. More standing exercise performed today with patient.Patient will continue to benefit from skilled physical therapy to improve functional mobility, improve prosthetic use, and increase ambulation to increase LOF and improve overall QOL.                      PT Short Term Goals - 12/11/17 1058      PT SHORT TERM GOAL #1   Title  Patient will be independent in home exercise program to improve strength/mobility for better functional independence with ADLs.    Baseline  HEP given, 12/11/17: Performing daily:     Time  2    Period  Weeks    Status  Achieved      PT SHORT TERM GOAL #2   Title  Patient will don/doff prosthesis independently to allow for increased mobility in home.     Baseline  8/14: requires PT to guide/direct through process 8/28: independent    Time  2    Period  Weeks    Status  Achieved      PT SHORT TERM GOAL #3   Title  Patient will ambulate 10 meters with least assistive device and prosthesis to improve mobility in home.     Baseline  8/14: not walking outside of // bars yet  8/28: 63 seconds with RW and CGA     Time  2    Period  Weeks    Status  Achieved      PT SHORT TERM GOAL #4   Title  Patient will perform STS with single UE support to decrease reliance upon UE's for stability     Baseline  patient requires BUE support 11/5: 1 UR support    Time  2    Period  Weeks    Status  Achieved        PT Long Term Goals - 12/11/17 1059      PT LONG TERM GOAL #1   Title  Patient will ambulate 60 ft  with least assistive AD and prosthesis to improve mobility around home and increase independence.     Baseline  8/14: requires use of // bars 8/28: 30 ft with RW and CGA 9/17: 74 ft x2 with CGA and RW     Time  4    Period  Weeks    Status  Achieved  PT LONG TERM GOAL #2   Title  Patient (> 38 years old) will complete five times sit to stand test in < 15 seconds indicating an increased LE strength and improved balance.    Baseline  8/14: 61 seconds 8/28; 42 seconds 9/17: 24 seconds BUE support with LLE 10/10: 17 seconds with excessive BUE support  11/5: 18seconds BUE support; 12/11/17: 12/11/17: 18.5s with BUE support but no AD    Time  4    Period  Weeks    Status  Partially Met    Target Date  02/05/18      PT LONG TERM GOAL #3   Title  Patient will reduce timed up and go to <11 seconds to reduce fall risk and demonstrate improved transfer/gait ability.    Baseline  8/14: unable to perform 8/28: 1 min 30 seconds 9/17: 38 seconds RW 10/10: 25 seconds with RW 11/5: 20 seconds with cane;, 12/11/17: 17.4s with spc    Time  4    Period  Weeks    Status  On-going    Target Date  02/05/18      PT LONG TERM GOAL #4   Title  Patient will increase lower extremity functional scale to >60/80 to demonstrate improved functional mobility and increased tolerance with ADLs.     Baseline  814: 29/80 8/28: 20/80 10/10: 20/80 11/5: 22/80; 12/11/17: 18/80    Time  4    Period  Weeks    Status  On-going    Target Date  02/05/18      PT LONG TERM GOAL #5   Title  Patient will increase BLE gross strength to 4+/5 as to improve functional strength for independent gait, increased standing tolerance and increased ADL ability.    Baseline  8/14: 4-/15 8/28: 4-/5  9/17: L 4-/5 R 4/5      Time  4    Period  Weeks    Status  Achieved      PT LONG TERM GOAL #6   Title  Patient will perform 10 MWT in >.5 m/s for improved mobility and safety with negotiating natural environment    Baseline  8/28: .16 m/s with  RW  9/17: .53 m/s     Time  4    Period  Weeks    Status  Achieved      PT LONG TERM GOAL #7   Title  Patient will perform 10 MWT in >1.0 m/s for improved mobility and safety with negotiating natural environment    Baseline  9/17: .53 m/s 10/10: .63 m/s with RW 11/5: 0.20ms; 12/11/17: 15.33s = 0.65 m/s with spc    Time  4    Period  Weeks    Status  Partially Met      PT LONG TERM GOAL #8   Title  Patient will ambulate 300 ft with least assistive AD and prosthesis to improve mobility around home and increase independence.     Baseline  9/17: 74 ft CGA with RW 10/10 ambulated 280 ft with RW, [facility]: ambulate 4686fon 10/31    Time  4    Period  Weeks    Status  Achieved      PT LONG TERM GOAL  #9   TITLE  Patient will ascend/descend 4 stairs with rail assist independently without loss of balance to improve ability to get in/out of home.     Baseline  9/17: unable to negotiate stairs 10/10: ascend with  step to pattern, BUE support, and CGA 11/5: patient demonstrates ability to perform CGA and BUE support    Time  4    Period  Weeks    Status  Achieved      PT LONG TERM GOAL  #10   TITLE  Patient will ascend/descend 4 stairs with rail assist independently with reciprocating pattern without loss of balance to improve ability to get in/out of home.     Baseline  11/5: step to pattern BUE support CGA; 12/11/17: BUE heavy support and reciprocal pattern;    Time  4    Period  Weeks    Status  Achieved      PT LONG TERM GOAL  #11   TITLE  Patient will ambulate >1048f during 642m with LRAD to demonstrate improved community ambulation and functional mobility     Baseline  11/5: will complete next session; 12/11/17    Time  4    Period  Weeks    Status  Deferred    Target Date  02/05/18            Plan - 12/13/17 1029    Clinical Impression Statement  Performed extensive gait training in hallway with patient and he is able to go farther today with less rest breaks. He  is able to ambulate approximately 700' with two seated rest breaks. Pt requires only intermittent use of spc but 2/3 of distance he is able to perform without an assistive device. More standing exercise performed today with patient.Patient will continue to benefit from skilled physical therapy to improve functional mobility, improve prosthetic use, and increase ambulation to increase LOF and improve overall QOL.    Rehab Potential  Fair    Clinical Impairments Affecting Rehab Potential  (+) previous independence, motivation to return to walking (-) lives alone, hx of diabetes, limited vision     PT Frequency  2x / week    PT Duration  8 weeks    PT Treatment/Interventions  ADLs/Self Care Home Management;Cryotherapy;Electrical Stimulation;Ultrasound;Traction;Moist Heat;DME Instruction;Gait training;Stair training;Functional mobility training;Therapeutic activities;Therapeutic exercise;Patient/family education;Neuromuscular re-education;Balance training;Prosthetic Training;Wheelchair mobility training;Manual techniques;Manual lymph drainage;Compression bandaging;Taping;Energy conservation;Passive range of motion    PT Next Visit Plan  gait training, strengthening, balance    PT Home Exercise Plan  as prescribed    Consulted and Agree with Plan of Care  Patient       Patient will benefit from skilled therapeutic intervention in order to improve the following deficits and impairments:  Abnormal gait, Decreased activity tolerance, Decreased balance, Decreased knowledge of precautions, Decreased endurance, Decreased knowledge of use of DME, Decreased mobility, Decreased range of motion, Difficulty walking, Decreased strength, Increased edema, Impaired flexibility, Impaired perceived functional ability, Prosthetic Dependency, Postural dysfunction, Improper body mechanics, Pain  Visit Diagnosis: Muscle weakness (generalized)  Unsteadiness on feet     Problem List Patient Active Problem List    Diagnosis Date Noted  . Abnormality of gait 10/12/2017  . Poorly controlled type 2 diabetes mellitus with peripheral neuropathy ([facility] [place]  . Flatulence   . Hypomagnesemia   . Unilateral complete BKA, right, sequela ([place]  . Benign essential HTN   . Hypoalbuminemia due to protein-calorie malnutrition (HCGreat Neck  . S/P below knee amputation, right (HCRiple[place]04/03/2017  . Acute blood loss anemia   . Other encephalopathy 04/08/2017  . Encephalopathy 04/08/2017  . Altered mental status 04/08/2017  . Labile blood pressure   . Labile blood glucose   . Drug induced constipation   .  Stage 3 chronic kidney disease ([place])   . Bacteremia   . S/P unilateral BKA (below knee amputation), right ([place]) 04/04/2017  . PAD (peripheral artery disease) ([place])   . Type 2 diabetes mellitus with right diabetic foot ulcer ([place])   . Post-operative pain   . Legally blind   . Upper GI bleed   . Streptococcal bacteremia 04/01/2017  . Hypokalemia 03/30/2017  . Uncontrolled type 2 diabetes mellitus with hyperglycemia, with long-term current use of insulin ([place]) 03/30/2017  . Type 2 diabetes mellitus with peripheral neuropathy ([place]) 03/30/2017  . AKI (acute kidney injury) ([place]) 03/30/2017  . CKD stage 3 due to type 2 diabetes mellitus ([place]) 03/30/2017  . Sepsis ([place]) 03/30/2017  . Heart murmur 11/22/2016  . Hyperlipidemia associated with type 2 diabetes mellitus ([place]) 11/22/2016  . Obstructive sleep apnea syndrome 11/22/2016  . Essential hypertension 12/17/2012   Phillips Grout PT, DPT, GCS  Dianey Suchy 12/13/2017, 1:10 PM  [place] MAIN [facility] SERVICES [address] [place], [place], [address] Phone: [phone]   Fax:  [phone]  Name: Trevor [name] MRN: [id number] Date of Birth: [birth year]

## 2017-12-18 ENCOUNTER — Ambulatory Visit: Payer: Medicare Other

## 2017-12-18 DIAGNOSIS — R2689 Other abnormalities of gait and mobility: Secondary | ICD-10-CM

## 2017-12-18 DIAGNOSIS — M6281 Muscle weakness (generalized): Secondary | ICD-10-CM | POA: Diagnosis not present

## 2017-12-18 DIAGNOSIS — R2681 Unsteadiness on feet: Secondary | ICD-10-CM

## 2017-12-18 NOTE — Therapy (Signed)
[place] MAIN [facility] SERVICES [address] [place], [place], [address] Phone: [phone]   Fax:  [phone]  Physical Therapy Treatment  Patient Details  Name: Trevor [name] MRN: [id number] Date of Birth: 19-Nov-1955 Referring Provider (PT): Jamse Arn, DR   Encounter Date: 12/18/2017  PT End of Session - 12/18/17 1112    Visit Number  22    Number of Visits  43    Date for PT Re-Evaluation  02/05/18    Authorization Type   PN 2/10 start 11/14/17, goals updated 12/11/17    PT Start Time  1030    PT Stop Time  1105    PT Time Calculation (min)  35 min    Equipment Utilized During Treatment  Gait belt;Other (comment)   R prosthesis   Activity Tolerance  Patient tolerated treatment well    Behavior During Therapy  WFL for tasks assessed/performed       Past Medical History:  Diagnosis Date  . Diabetes mellitus without complication ([place])   . Heart murmur   . Hyperlipidemia   . Hypertension   . Sleep apnea     Past Surgical History:  Procedure Laterality Date  . AMPUTATION Right 03/31/2017   Procedure: RIGHT FOOT 1ST AND 2ND RAY AMPUTATION;  Surgeon: Newt Minion, MD;  Location: [place];  Service: Orthopedics;  Laterality: Right;  . AMPUTATION Right 04/01/2017   Procedure: AMPUTATION BELOW KNEE;  Surgeon: Newt Minion, MD;  Location: [place];  Service: Orthopedics;  Laterality: Right;  . COLONOSCOPY WITH PROPOFOL N/A 10/02/2017   Procedure: COLONOSCOPY WITH PROPOFOL;  Surgeon: [name], MD;  Location: [facility] ENDOSCOPY;  Service: Gastroenterology;  Laterality: N/A;  . CORNEAL TRANSPLANT      There were no vitals filed for this visit.  Subjective Assessment - 12/18/17 1036    Subjective  Patient reported pain at start of session, able to point to medial patellar tendon as painful. Stated it doesn't always hurt, but when it does, it greatly inhibits his ability to walk.     Pertinent History  62 year old right-handed male with  history of diabetes mellitus, hypertension, legally blind, and CKD presenting for BKA 04/01/17. Patient wears contacts and can see better now.  Patient had two surgeries, one was a couple toes then they decided to go below the knee. 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 Has ambulated with RW in hospital with PT. Patient has had home health therapy and was in inpatient rehab about a month. Patient reports he was mainly sitting down, with occasional walking, last home health therapy in May. Patient reports some standing up but no exercises. Patient lives alone in the house and is scared they might fall. Patient received prosthetic leg Tuesday of last week.    Limitations  Standing;Walking;House hold activities;Other (comment)    How long can you stand comfortably?  10-15 minutes    How long can you walk comfortably?  10-15 minutes    Patient Stated Goals  going up steps and driving car.     Currently in Pain?  Yes    Pain Score  4     Pain Location  Hip    Pain Orientation  Right    Pain Descriptors / Indicators  Tender;Sore    Pain Type  Chronic pain    Pain Onset  More than a month ago  Pain Frequency  Intermittent       Pt ambulated into clinic with RW and R prosthesis in place.   TREATMENT:  Therapeutic Exercise: Sit to stand without UE support from elevated mat table 2 x 10, CGA; Standing mini squats 2 x 10; Forward lunges with RLE forward x 10; Intermittent seated rest breaks between each set of exercises due to fatigue.      Gait Training: Pt ambulated into clinic with RW, able to ambulate ~350f with SPC, further gait training deferred today due to increased pain with weight bearing of R knee. Pt is seeing prosthesist tomorrow, and will be informing them to assess.  Pt needed mod verbal/visual cues for proper exercise technique with good carry over. Supervision-CGA for exercises for safety.     PT  Education - 12/18/17 1039    Education Details  exercise technique, equal weight  bearing through both LE during exercises as much as possible    Person(s) Educated  Patient    Methods  Explanation;Demonstration;Verbal cues    Comprehension  Verbalized understanding       PT Short Term Goals - 12/11/17 1058      PT SHORT TERM GOAL #1   Title  Patient will be independent in home exercise program to improve strength/mobility for better functional independence with ADLs.    Baseline  HEP given, 12/11/17: Performing daily:     Time  2    Period  Weeks    Status  Achieved      PT SHORT TERM GOAL #2   Title  Patient will don/doff prosthesis independently to allow for increased mobility in home.     Baseline  8/14: requires PT to guide/direct through process 8/28: independent    Time  2    Period  Weeks    Status  Achieved      PT SHORT TERM GOAL #3   Title  Patient will ambulate 10 meters with least assistive device and prosthesis to improve mobility in home.     Baseline  8/14: not walking outside of // bars yet  8/28: 63 seconds with RW and CGA     Time  2    Period  Weeks    Status  Achieved      PT SHORT TERM GOAL #4   Title  Patient will perform STS with single UE support to decrease reliance upon UE's for stability     Baseline  patient requires BUE support 11/5: 1 UR support    Time  2    Period  Weeks    Status  Achieved        PT Long Term Goals - 12/11/17 1059      PT LONG TERM GOAL #1   Title  Patient will ambulate 60 ft with least assistive AD and prosthesis to improve mobility around home and increase independence.     Baseline  8/14: requires use of // bars 8/28: 30 ft with RW and CGA 9/17: 74 ft x2 with CGA and RW     Time  4    Period  Weeks    Status  Achieved      PT LONG TERM GOAL #2   Title  Patient (> 61 years old) will complete five times sit to stand test in < 15 seconds indicating an increased LE strength and improved balance.    Baseline  8/14: 61  seconds 8/28; 42 seconds 9/17: 24 seconds BUE support with LLE 10/10: 17  seconds with excessive BUE support  11/5: 18seconds BUE support; 12/11/17: 12/11/17: 18.5s with BUE support but no AD    Time  4    Period  Weeks    Status  Partially Met    Target Date  02/05/18      PT LONG TERM GOAL #3   Title  Patient will reduce timed up and go to <11 seconds to reduce fall risk and demonstrate improved transfer/gait ability.    Baseline  8/14: unable to perform 8/28: 1 min 30 seconds 9/17: 38 seconds RW 10/10: 25 seconds with RW 11/5: 20 seconds with cane;, 12/11/17: 17.4s with spc    Time  4    Period  Weeks    Status  On-going    Target Date  02/05/18      PT LONG TERM GOAL #4   Title  Patient will increase lower extremity functional scale to >60/80 to demonstrate improved functional mobility and increased tolerance with ADLs.     Baseline  814: 29/80 8/28: 20/80 10/10: 20/80 11/5: 22/80; 12/11/17: 18/80    Time  4    Period  Weeks    Status  On-going    Target Date  02/05/18      PT LONG TERM GOAL #5   Title  Patient will increase BLE gross strength to 4+/5 as to improve functional strength for independent gait, increased standing tolerance and increased ADL ability.    Baseline  8/14: 4-/15 8/28: 4-/5  9/17: L 4-/5 R 4/5      Time  4    Period  Weeks    Status  Achieved      PT LONG TERM GOAL #6   Title  Patient will perform 10 MWT in >.5 m/s for improved mobility and safety with negotiating natural environment    Baseline  8/28: .16 m/s with RW  9/17: .53 m/s     Time  4    Period  Weeks    Status  Achieved      PT LONG TERM GOAL #7   Title  Patient will perform 10 MWT in >1.0 m/s for improved mobility and safety with negotiating natural environment    Baseline  9/17: .53 m/s 10/10: .63 m/s with RW 11/5: 0.17ms; 12/11/17: 15.33s = 0.65 m/s with spc    Time  4    Period  Weeks    Status  Partially Met      PT LONG TERM GOAL #8   Title  Patient will ambulate 300 ft with least  assistive AD and prosthesis to improve mobility around home and increase independence.     Baseline  9/17: 74 ft CGA with RW 10/10 ambulated 280 ft with RW, [facility]: ambulate [id number] 10/31    Time  4    Period  Weeks    Status  Achieved      PT LONG TERM GOAL  #9   TITLE  Patient will ascend/descend 4 stairs with rail assist independently without loss of balance to improve ability to get in/out of home.     Baseline  9/17: unable to negotiate stairs 10/10: ascend with step to pattern, BUE support, and CGA 11/5: patient demonstrates ability to perform CGA and BUE support    Time  4    Period  Weeks    Status  Achieved      PT LONG TERM GOAL  #10   TITLE  Patient will ascend/descend 4  stairs with rail assist independently with reciprocating pattern without loss of balance to improve ability to get in/out of home.     Baseline  11/5: step to pattern BUE support CGA; 12/11/17: BUE heavy support and reciprocal pattern;    Time  4    Period  Weeks    Status  Achieved      PT LONG TERM GOAL  #11   TITLE  Patient will ambulate >1065f during 662m with LRAD to demonstrate improved community ambulation and functional mobility     Baseline  11/5: will complete next session; 12/11/17    Time  4    Period  Weeks    Status  Deferred    Target Date  02/05/18            Plan - 12/18/17 1550    Clinical Impression Statement  Session focused on strength training due to complaints of R knee pain with weight bearing/ambulation today. Able to do 1 bout of ambulation with SPC ~30032fmoderately antalgic on R.  Pt most challenged by static lunges with RLE forward. Fatigued at end of session.    Rehab Potential  Fair    Clinical Impairments Affecting Rehab Potential  (+) previous independence, motivation to return to walking (-) lives alone, hx of diabetes, limited vision     PT Frequency  2x / week    PT Duration  8 weeks    PT Treatment/Interventions  ADLs/Self Care Home  Management;Cryotherapy;Electrical Stimulation;Ultrasound;Traction;Moist Heat;DME Instruction;Gait training;Stair training;Functional mobility training;Therapeutic activities;Therapeutic exercise;Patient/family education;Neuromuscular re-education;Balance training;Prosthetic Training;Wheelchair mobility training;Manual techniques;Manual lymph drainage;Compression bandaging;Taping;Energy conservation;Passive range of motion    PT Next Visit Plan  gait training, strengthening, balance    PT Home Exercise Plan  as prescribed    Consulted and Agree with Plan of Care  Patient       Patient will benefit from skilled therapeutic intervention in order to improve the following deficits and impairments:  Abnormal gait, Decreased activity tolerance, Decreased balance, Decreased knowledge of precautions, Decreased endurance, Decreased knowledge of use of DME, Decreased mobility, Decreased range of motion, Difficulty walking, Decreased strength, Increased edema, Impaired flexibility, Impaired perceived functional ability, Prosthetic Dependency, Postural dysfunction, Improper body mechanics, Pain  Visit Diagnosis: Muscle weakness (generalized)  Unsteadiness on feet  Other abnormalities of gait and mobility     Problem List Patient Active Problem List   Diagnosis Date Noted  . Abnormality of gait 10/12/2017  . Poorly controlled type 2 diabetes mellitus with peripheral neuropathy ([place] . Flatulence   . Hypomagnesemia   . Unilateral complete BKA, right, sequela ([place]CC[place] . Benign essential HTN   . Hypoalbuminemia due to protein-calorie malnutrition ([facility]CC[place] . S/P below knee amputation, right ([place]CC[place]4/03/2017  . Acute blood loss anemia   . Other encephalopathy 04/08/2017  . Encephalopathy 04/08/2017  . Altered mental status 04/08/2017  . Labile blood pressure   . Labile blood glucose   . Drug induced constipation   . Stage 3 chronic kidney disease ([place]CC[place] . Bacteremia   . S/P unilateral BKA (below  knee amputation), right ([place]CC[place]3/26/2019  . PAD (peripheral artery disease) ([facility]CC[place] [place] . Type 2 diabetes mellitus with right diabetic foot ulcer ([place]CC[place] . Post-operative pain   . Legally blind   . Upper GI bleed   . Streptococcal bacteremia 04/01/2017  . Hypokalemia 03/30/2017  . Uncontrolled type 2 diabetes mellitus with hyperglycemia, with long-term current use of insulin (HCCKatherine3/21/2019  .  Type 2 diabetes mellitus with peripheral neuropathy ([place]) 03/30/2017  . AKI (acute kidney injury) ([place]) 03/30/2017  . CKD stage 3 due to type 2 diabetes mellitus ([place]) 03/30/2017  . Sepsis ([place]) 03/30/2017  . Heart murmur 11/22/2016  . Hyperlipidemia associated with type 2 diabetes mellitus ([place]) 11/22/2016  . Obstructive sleep apnea syndrome 11/22/2016  . Essential hypertension 12/17/2012   Lieutenant Diego PT, DPT 3:53 PM,12/18/17 [place] MAIN [facility] SERVICES [address] [place], [place], [address] Phone: [phone]   Fax:  [phone]  Name: Trevor [name] MRN: [id number] Date of Birth: Jun 29, 1955

## 2017-12-20 ENCOUNTER — Ambulatory Visit: Payer: Medicare Other

## 2017-12-20 DIAGNOSIS — M6281 Muscle weakness (generalized): Secondary | ICD-10-CM

## 2017-12-20 DIAGNOSIS — R2689 Other abnormalities of gait and mobility: Secondary | ICD-10-CM

## 2017-12-20 DIAGNOSIS — R2681 Unsteadiness on feet: Secondary | ICD-10-CM

## 2017-12-20 NOTE — Therapy (Signed)
[place] MAIN [facility] SERVICES [address] [place], [place], [address] Phone: [phone]   Fax:  [phone]  Physical Therapy Treatment  Patient Details  Name: Trevor [name] MRN: [id number] Date of Birth: [birth year] Referring Provider (PT): Jamse Arn, DR   Encounter Date: 12/20/2017  PT End of Session - 12/20/17 1028    Visit Number  23    Number of Visits  43    Date for PT Re-Evaluation  02/05/18    Authorization Type   PN 3/10 start 11/14/17, goals updated 12/11/17    PT Start Time  1030    PT Stop Time  1115    PT Time Calculation (min)  45 min    Equipment Utilized During Treatment  Gait belt;Other (comment)   R prosthesis   Activity Tolerance  Patient tolerated treatment well    Behavior During Therapy  WFL for tasks assessed/performed       Past Medical History:  Diagnosis Date  . Diabetes mellitus without complication ([place])   . Heart murmur   . Hyperlipidemia   . Hypertension   . Sleep apnea     Past Surgical History:  Procedure Laterality Date  . AMPUTATION Right 03/31/2017   Procedure: RIGHT FOOT 1ST AND 2ND RAY AMPUTATION;  Surgeon: Newt Minion, MD;  Location: [place];  Service: Orthopedics;  Laterality: Right;  . AMPUTATION Right 04/01/2017   Procedure: AMPUTATION BELOW KNEE;  Surgeon: Newt Minion, MD;  Location: [place];  Service: Orthopedics;  Laterality: Right;  . COLONOSCOPY WITH PROPOFOL N/A 10/02/2017   Procedure: COLONOSCOPY WITH PROPOFOL;  Surgeon: [name], MD;  Location: [facility] ENDOSCOPY;  Service: Gastroenterology;  Laterality: N/A;  . CORNEAL TRANSPLANT      There were no vitals filed for this visit.  Subjective Assessment - 12/20/17 1028    Subjective  Pt states that he is doing well today. He went to see his prosthetist since the last appointment who made some adjustments to his prosthesis but pt is unable to recall what was modified. Pt states that he is not having any further anterior  knee pain. He has not tried walking with his prosthesis since it was modified.     Pertinent History  62 year old right-handed male with history of diabetes mellitus, hypertension, legally blind, and CKD presenting for BKA 04/01/17. Patient wears contacts and can see better now.  Patient had two surgeries, one was a couple toes then they decided to go below the knee. 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 Has ambulated with RW in hospital with PT. Patient has had home health therapy and was in inpatient rehab about a month. Patient reports he was mainly sitting down, with occasional walking, last home health therapy in May. Patient reports some standing up but no exercises. Patient lives alone in the house and is scared they might fall. Patient received prosthetic leg Tuesday of last week.    Limitations  Standing;Walking;House hold activities;Other (comment)    How long can you stand comfortably?  10-15 minutes    How long can you walk comfortably?  10-15 minutes    Patient Stated Goals  going up steps and driving car.     Currently in Pain?  No/denies    Pain Onset  --         TREATMENT  Donning of prosthesis  Therapeutic Exercise: Sit to  stand withoutUE supportfrom elevated mat table2 x 10, CGA; Seated clams with manual resistance x 15; Seated adductor squeeze with manual resistance x 15 Quantum R single leg press 75# 2 x 15; Conversation with patient regarding transportation challenges;   Gait Training: Extensive gait trainingin hallway with intermittent use of spc but 90% of distance performed without anassistive device.CGA during gait. Pt is able to ambulate approximately 700' with two seated rest breaks. Pt provided cues to increase L step length as well as for upright posture. Fatigue monitored during ambulation.   Doffing of prosthesis   Pt educated throughout session about proper posture and  technique with exercises. Improved exercise technique, movement at target joints, use of target muscles after min to mod verbal, visual, tactile cues.   Performed extensive gait trainingin hallway with patient and he reports no increase in R knee pain like he was having last session.  He is able to ambulate approximately 700' with two seated rest breaks. Pt requires onlyintermittent use of spc but 90% of distancehe is able toperform without an assistive device.Continued with LE strengthening and lowered mat table for sit to stand. Patient will continue to benefit from skilled physical therapy to improve functional mobility, improve prosthetic use, and increase ambulation to increase LOF and improve overall QOL.                         PT Short Term Goals - 12/11/17 1058      PT SHORT TERM GOAL #1   Title  Patient will be independent in home exercise program to improve strength/mobility for better functional independence with ADLs.    Baseline  HEP given, 12/11/17: Performing daily:     Time  2    Period  Weeks    Status  Achieved      PT SHORT TERM GOAL #2   Title  Patient will don/doff prosthesis independently to allow for increased mobility in home.     Baseline  8/14: requires PT to guide/direct through process 8/28: independent    Time  2    Period  Weeks    Status  Achieved      PT SHORT TERM GOAL #3   Title  Patient will ambulate 10 meters with least assistive device and prosthesis to improve mobility in home.     Baseline  8/14: not walking outside of // bars yet  8/28: 63 seconds with RW and CGA     Time  2    Period  Weeks    Status  Achieved      PT SHORT TERM GOAL #4   Title  Patient will perform STS with single UE support to decrease reliance upon UE's for stability     Baseline  patient requires BUE support 11/5: 1 UR support    Time  2    Period  Weeks    Status  Achieved        PT Long Term Goals - 12/11/17 1059      PT LONG TERM  GOAL #1   Title  Patient will ambulate 60 ft with least assistive AD and prosthesis to improve mobility around home and increase independence.     Baseline  8/14: requires use of // bars 8/28: 30 ft with RW and CGA 9/17: 74 ft x2 with CGA and RW     Time  4    Period  Weeks    Status  Achieved  PT LONG TERM GOAL #2   Title  Patient (> 66 years old) will complete five times sit to stand test in < 15 seconds indicating an increased LE strength and improved balance.    Baseline  8/14: 61 seconds 8/28; 42 seconds 9/17: 24 seconds BUE support with LLE 10/10: 17 seconds with excessive BUE support  11/5: 18seconds BUE support; 12/11/17: 12/11/17: 18.5s with BUE support but no AD    Time  4    Period  Weeks    Status  Partially Met    Target Date  02/05/18      PT LONG TERM GOAL #3   Title  Patient will reduce timed up and go to <11 seconds to reduce fall risk and demonstrate improved transfer/gait ability.    Baseline  8/14: unable to perform 8/28: 1 min 30 seconds 9/17: 38 seconds RW 10/10: 25 seconds with RW 11/5: 20 seconds with cane;, 12/11/17: 17.4s with spc    Time  4    Period  Weeks    Status  On-going    Target Date  02/05/18      PT LONG TERM GOAL #4   Title  Patient will increase lower extremity functional scale to >60/80 to demonstrate improved functional mobility and increased tolerance with ADLs.     Baseline  814: 29/80 8/28: 20/80 10/10: 20/80 11/5: 22/80; 12/11/17: 18/80    Time  4    Period  Weeks    Status  On-going    Target Date  02/05/18      PT LONG TERM GOAL #5   Title  Patient will increase BLE gross strength to 4+/5 as to improve functional strength for independent gait, increased standing tolerance and increased ADL ability.    Baseline  8/14: 4-/15 8/28: 4-/5  9/17: L 4-/5 R 4/5      Time  4    Period  Weeks    Status  Achieved      PT LONG TERM GOAL #6   Title  Patient will perform 10 MWT in >.5 m/s for improved mobility and safety with negotiating natural  environment    Baseline  8/28: .16 m/s with RW  9/17: .53 m/s     Time  4    Period  Weeks    Status  Achieved      PT LONG TERM GOAL #7   Title  Patient will perform 10 MWT in >1.0 m/s for improved mobility and safety with negotiating natural environment    Baseline  9/17: .53 m/s 10/10: .63 m/s with RW 11/5: 0.35ms; 12/11/17: 15.33s = 0.65 m/s with spc    Time  4    Period  Weeks    Status  Partially Met      PT LONG TERM GOAL #8   Title  Patient will ambulate 300 ft with least assistive AD and prosthesis to improve mobility around home and increase independence.     Baseline  9/17: 74 ft CGA with RW 10/10 ambulated 280 ft with RW, [facility]: ambulate [id number] 10/31    Time  4    Period  Weeks    Status  Achieved      PT LONG TERM GOAL  #9   TITLE  Patient will ascend/descend 4 stairs with rail assist independently without loss of balance to improve ability to get in/out of home.     Baseline  9/17: unable to negotiate stairs 10/10: ascend with  step to pattern, BUE support, and CGA 11/5: patient demonstrates ability to perform CGA and BUE support    Time  4    Period  Weeks    Status  Achieved      PT LONG TERM GOAL  #10   TITLE  Patient will ascend/descend 4 stairs with rail assist independently with reciprocating pattern without loss of balance to improve ability to get in/out of home.     Baseline  11/5: step to pattern BUE support CGA; 12/11/17: BUE heavy support and reciprocal pattern;    Time  4    Period  Weeks    Status  Achieved      PT LONG TERM GOAL  #11   TITLE  Patient will ambulate >1061f during 633m with LRAD to demonstrate improved community ambulation and functional mobility     Baseline  11/5: will complete next session; 12/11/17    Time  4    Period  Weeks    Status  Deferred    Target Date  02/05/18            Plan - 12/20/17 1029    Clinical Impression Statement  Performed extensive gait trainingin hallway with patient and he reports  no increase in R knee pain like he was having last session.  He is able to ambulate approximately 700' with two seated rest breaks. Pt requires onlyintermittent use of spc but 90% of distancehe is able toperform without an assistive device.Continued with LE strengthening and lowered mat table for sit to stand. Patient will continue to benefit from skilled physical therapy to improve functional mobility, improve prosthetic use, and increase ambulation to increase LOF and improve overall QOL.    Rehab Potential  Fair    Clinical Impairments Affecting Rehab Potential  (+) previous independence, motivation to return to walking (-) lives alone, hx of diabetes, limited vision     PT Frequency  2x / week    PT Duration  8 weeks    PT Treatment/Interventions  ADLs/Self Care Home Management;Cryotherapy;Electrical Stimulation;Ultrasound;Traction;Moist Heat;DME Instruction;Gait training;Stair training;Functional mobility training;Therapeutic activities;Therapeutic exercise;Patient/family education;Neuromuscular re-education;Balance training;Prosthetic Training;Wheelchair mobility training;Manual techniques;Manual lymph drainage;Compression bandaging;Taping;Energy conservation;Passive range of motion    PT Next Visit Plan  gait training, strengthening, balance    PT Home Exercise Plan  as prescribed    Consulted and Agree with Plan of Care  Patient       Patient will benefit from skilled therapeutic intervention in order to improve the following deficits and impairments:  Abnormal gait, Decreased activity tolerance, Decreased balance, Decreased knowledge of precautions, Decreased endurance, Decreased knowledge of use of DME, Decreased mobility, Decreased range of motion, Difficulty walking, Decreased strength, Increased edema, Impaired flexibility, Impaired perceived functional ability, Prosthetic Dependency, Postural dysfunction, Improper body mechanics, Pain  Visit Diagnosis: Muscle weakness  (generalized)  Unsteadiness on feet  Other abnormalities of gait and mobility     Problem List Patient Active Problem List   Diagnosis Date Noted  . Abnormality of gait 10/12/2017  . Poorly controlled type 2 diabetes mellitus with peripheral neuropathy ([place]  . Flatulence   . Hypomagnesemia   . Unilateral complete BKA, right, sequela ([place]  . Benign essential HTN   . Hypoalbuminemia due to protein-calorie malnutrition ([place]CA[place]  . S/P below knee amputation, right (HCLevelland04/03/2017  . Acute blood loss anemia   . Other encephalopathy 04/08/2017  . Encephalopathy 04/08/2017  . Altered mental status 04/08/2017  . Labile blood pressure   .  Labile blood glucose   . Drug induced constipation   . Stage 3 chronic kidney disease ([place])   . Bacteremia   . S/P unilateral BKA (below knee amputation), right ([place]) 04/04/2017  . PAD (peripheral artery disease) ([place])   . Type 2 diabetes mellitus with right diabetic foot ulcer ([place])   . Post-operative pain   . Legally blind   . Upper GI bleed   . Streptococcal bacteremia 04/01/2017  . Hypokalemia 03/30/2017  . Uncontrolled type 2 diabetes mellitus with hyperglycemia, with long-term current use of insulin ([place]) 03/30/2017  . Type 2 diabetes mellitus with peripheral neuropathy ([place]) 03/30/2017  . AKI (acute kidney injury) ([place]) 03/30/2017  . CKD stage 3 due to type 2 diabetes mellitus ([place]) 03/30/2017  . Sepsis ([place]) 03/30/2017  . Heart murmur 11/22/2016  . Hyperlipidemia associated with type 2 diabetes mellitus ([place]) 11/22/2016  . Obstructive sleep apnea syndrome 11/22/2016  . Essential hypertension 12/17/2012   Phillips Grout PT, DPT, GCS  Lyliana Dicenso 12/21/2017, 9:32 AM  [place] MAIN [facility] SERVICES [address] [place], [place], [address] Phone: [phone]   Fax:  [phone]  Name: GILLERMO POCH MRN: [id number] Date of Birth: May 29, 1955

## 2017-12-25 ENCOUNTER — Ambulatory Visit: Payer: Medicare Other

## 2017-12-25 DIAGNOSIS — M6281 Muscle weakness (generalized): Secondary | ICD-10-CM

## 2017-12-25 DIAGNOSIS — R2681 Unsteadiness on feet: Secondary | ICD-10-CM

## 2017-12-25 NOTE — Therapy (Signed)
[place] MAIN [facility] SERVICES [address] [place], [place], [address] Phone: [phone]   Fax:  [phone]  Physical Therapy Treatment  Patient Details  Name: Trevor [name] MRN: [id number] Date of Birth: 12-Jan-1955 Referring Provider (PT): Jamse Arn, DR   Encounter Date: 12/25/2017  PT End of Session - 12/25/17 1056    Visit Number  24    Number of Visits  43    Date for PT Re-Evaluation  02/05/18    Authorization Type   PN 4/10 start 11/14/17, goals updated 12/11/17    PT Start Time  1030    PT Stop Time  1115    PT Time Calculation (min)  45 min    Equipment Utilized During Treatment  Gait belt;Other (comment)   R prosthesis   Activity Tolerance  Patient tolerated treatment well    Behavior During Therapy  WFL for tasks assessed/performed       Past Medical History:  Diagnosis Date  . Diabetes mellitus without complication ([place])   . Heart murmur   . Hyperlipidemia   . Hypertension   . Sleep apnea     Past Surgical History:  Procedure Laterality Date  . AMPUTATION Right 03/31/2017   Procedure: RIGHT FOOT 1ST AND 2ND RAY AMPUTATION;  Surgeon: Newt Minion, MD;  Location: [place];  Service: Orthopedics;  Laterality: Right;  . AMPUTATION Right 04/01/2017   Procedure: AMPUTATION BELOW KNEE;  Surgeon: Newt Minion, MD;  Location: [place];  Service: Orthopedics;  Laterality: Right;  . COLONOSCOPY WITH PROPOFOL N/A 10/02/2017   Procedure: COLONOSCOPY WITH PROPOFOL;  Surgeon: [name], MD;  Location: [facility] ENDOSCOPY;  Service: Gastroenterology;  Laterality: N/A;  . CORNEAL TRANSPLANT      There were no vitals filed for this visit.  Subjective Assessment - 12/25/17 1056    Subjective  Pt states that he is doing well today. He denies any pain in his RLE upon arrival. No specific questions or concerns at this time.     Pertinent History  62 year old right-handed male with history of diabetes mellitus, hypertension, legally  blind, and CKD presenting for BKA 04/01/17. Patient wears contacts and can see better now.  Patient had two surgeries, one was a couple toes then they decided to go below the knee. 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 Has ambulated with RW in hospital with PT. Patient has had home health therapy and was in inpatient rehab about a month. Patient reports he was mainly sitting down, with occasional walking, last home health therapy in May. Patient reports some standing up but no exercises. Patient lives alone in the house and is scared they might fall. Patient received prosthetic leg Tuesday of last week.    Limitations  Standing;Walking;House hold activities;Other (comment)    How long can you stand comfortably?  10-15 minutes    How long can you walk comfortably?  10-15 minutes    Patient Stated Goals  going up steps and driving car.     Currently in Pain?  No/denies         TREATMENT  Donning of prosthesis  Therapeutic Exercise: Sit to stand withoutUE supportfrom low mat table2 x 10, CGA; Seated clams with manual resistance x 15; Seated adductor squeeze with manual resistance x 15 Seated marches alternating LE x 15; Quantum R single leg press 75# x 20, 90# x  10;   Gait Training: Extensive gait trainingin hallway with intermittent use of spc but 90% of distance performed without anassistive device.CGAduring gait.Pt is able to ambulate approximately 760'with two seated rest breaks.Pt provided cues to increase L step length as well as for upright posture. Fatigue monitored during ambulation. Cues to perform horizontal and vertical head turns during ambulation.    Doffing of prosthesis   Pt educated throughout session about proper posture and technique with exercises. Improved exercise technique, movement at target joints, use of target muscles after min to mod verbal, visual, tactile  cues.   Performed extensive gait trainingin hallway with patientand he reports no increase in R knee pain land he is able to increase his distance today. Heis able to ambulate approximately 760'with two seated rest breaks.Pt requires onlyintermittent use of spc but 90% of distancehe is able toperform without an assistive device.Continued with LE strengthening and lowered mat table for sit to stand. Pt able to increase resistance on leg press today. Patient will continue to benefit from skilled physical therapy to improve functional mobility, improve prosthetic use, and increase ambulation to increase his functional independence and improve overall QOL.                         PT Short Term Goals - 12/11/17 1058      PT SHORT TERM GOAL #1   Title  Patient will be independent in home exercise program to improve strength/mobility for better functional independence with ADLs.    Baseline  HEP given, 12/11/17: Performing daily:     Time  2    Period  Weeks    Status  Achieved      PT SHORT TERM GOAL #2   Title  Patient will don/doff prosthesis independently to allow for increased mobility in home.     Baseline  8/14: requires PT to guide/direct through process 8/28: independent    Time  2    Period  Weeks    Status  Achieved      PT SHORT TERM GOAL #3   Title  Patient will ambulate 10 meters with least assistive device and prosthesis to improve mobility in home.     Baseline  8/14: not walking outside of // bars yet  8/28: 63 seconds with RW and CGA     Time  2    Period  Weeks    Status  Achieved      PT SHORT TERM GOAL #4   Title  Patient will perform STS with single UE support to decrease reliance upon UE's for stability     Baseline  patient requires BUE support 11/5: 1 UR support    Time  2    Period  Weeks    Status  Achieved        PT Long Term Goals - 12/11/17 1059      PT LONG TERM GOAL #1   Title  Patient will ambulate 60 ft with least  assistive AD and prosthesis to improve mobility around home and increase independence.     Baseline  8/14: requires use of // bars 8/28: 30 ft with RW and CGA 9/17: 74 ft x2 with CGA and RW     Time  4    Period  Weeks    Status  Achieved      PT LONG TERM GOAL #2   Title  Patient (> 57 years old) will complete five times sit to  stand test in < 15 seconds indicating an increased LE strength and improved balance.    Baseline  8/14: 61 seconds 8/28; 42 seconds 9/17: 24 seconds BUE support with LLE 10/10: 17 seconds with excessive BUE support  11/5: 18seconds BUE support; 12/11/17: 12/11/17: 18.5s with BUE support but no AD    Time  4    Period  Weeks    Status  Partially Met    Target Date  02/05/18      PT LONG TERM GOAL #3   Title  Patient will reduce timed up and go to <11 seconds to reduce fall risk and demonstrate improved transfer/gait ability.    Baseline  8/14: unable to perform 8/28: 1 min 30 seconds 9/17: 38 seconds RW 10/10: 25 seconds with RW 11/5: 20 seconds with cane;, 12/11/17: 17.4s with spc    Time  4    Period  Weeks    Status  On-going    Target Date  02/05/18      PT LONG TERM GOAL #4   Title  Patient will increase lower extremity functional scale to >60/80 to demonstrate improved functional mobility and increased tolerance with ADLs.     Baseline  814: 29/80 8/28: 20/80 10/10: 20/80 11/5: 22/80; 12/11/17: 18/80    Time  4    Period  Weeks    Status  On-going    Target Date  02/05/18      PT LONG TERM GOAL #5   Title  Patient will increase BLE gross strength to 4+/5 as to improve functional strength for independent gait, increased standing tolerance and increased ADL ability.    Baseline  8/14: 4-/15 8/28: 4-/5  9/17: L 4-/5 R 4/5      Time  4    Period  Weeks    Status  Achieved      PT LONG TERM GOAL #6   Title  Patient will perform 10 MWT in >.5 m/s for improved mobility and safety with negotiating natural environment    Baseline  8/28: .16 m/s with RW  9/17:  .53 m/s     Time  4    Period  Weeks    Status  Achieved      PT LONG TERM GOAL #7   Title  Patient will perform 10 MWT in >1.0 m/s for improved mobility and safety with negotiating natural environment    Baseline  9/17: .53 m/s 10/10: .63 m/s with RW 11/5: 0.80ms; 12/11/17: 15.33s = 0.65 m/s with spc    Time  4    Period  Weeks    Status  Partially Met      PT LONG TERM GOAL #8   Title  Patient will ambulate 300 ft with least assistive AD and prosthesis to improve mobility around home and increase independence.     Baseline  9/17: 74 ft CGA with RW 10/10 ambulated 280 ft with RW, [facility] ambulate [id number] 10/31    Time  4    Period  Weeks    Status  Achieved      PT LONG TERM GOAL  #9   TITLE  Patient will ascend/descend 4 stairs with rail assist independently without loss of balance to improve ability to get in/out of home.     Baseline  9/17: unable to negotiate stairs 10/10: ascend with step to pattern, BUE support, and CGA 11/5: patient demonstrates ability to perform CGA and BUE support  Time  4    Period  Weeks    Status  Achieved      PT LONG TERM GOAL  #10   TITLE  Patient will ascend/descend 4 stairs with rail assist independently with reciprocating pattern without loss of balance to improve ability to get in/out of home.     Baseline  11/5: step to pattern BUE support CGA; 12/11/17: BUE heavy support and reciprocal pattern;    Time  4    Period  Weeks    Status  Achieved      PT LONG TERM GOAL  #11   TITLE  Patient will ambulate >1028f during 641m with LRAD to demonstrate improved community ambulation and functional mobility     Baseline  11/5: will complete next session; 12/11/17    Time  4    Period  Weeks    Status  Deferred    Target Date  02/05/18            Plan - 12/25/17 1057    Clinical Impression Statement  Performed extensive gait trainingin hallway with patientand he reports no increase in R knee pain land he is able to increase  his distance today. Heis able to ambulate approximately 760'with two seated rest breaks.Pt requires onlyintermittent use of spc but 90% of distancehe is able toperform without an assistive device.Continued with LE strengthening and lowered mat table for sit to stand. Pt able to increase resistance on leg press today. Patient will continue to benefit from skilled physical therapy to improve functional mobility, improve prosthetic use, and increase ambulation to increase his functional independence and improve overall QOL.    Rehab Potential  Fair    Clinical Impairments Affecting Rehab Potential  (+) previous independence, motivation to return to walking (-) lives alone, hx of diabetes, limited vision     PT Frequency  2x / week    PT Duration  8 weeks    PT Treatment/Interventions  ADLs/Self Care Home Management;Cryotherapy;Electrical Stimulation;Ultrasound;Traction;Moist Heat;DME Instruction;Gait training;Stair training;Functional mobility training;Therapeutic activities;Therapeutic exercise;Patient/family education;Neuromuscular re-education;Balance training;Prosthetic Training;Wheelchair mobility training;Manual techniques;Manual lymph drainage;Compression bandaging;Taping;Energy conservation;Passive range of motion    PT Next Visit Plan  gait training, strengthening, balance    PT Home Exercise Plan  as prescribed    Consulted and Agree with Plan of Care  Patient       Patient will benefit from skilled therapeutic intervention in order to improve the following deficits and impairments:  Abnormal gait, Decreased activity tolerance, Decreased balance, Decreased knowledge of precautions, Decreased endurance, Decreased knowledge of use of DME, Decreased mobility, Decreased range of motion, Difficulty walking, Decreased strength, Increased edema, Impaired flexibility, Impaired perceived functional ability, Prosthetic Dependency, Postural dysfunction, Improper body mechanics, Pain  Visit  Diagnosis: Muscle weakness (generalized)  Unsteadiness on feet     Problem List Patient Active Problem List   Diagnosis Date Noted  . Abnormality of gait 10/12/2017  . Poorly controlled type 2 diabetes mellitus with peripheral neuropathy ([place]  . Flatulence   . Hypomagnesemia   . Unilateral complete BKA, right, sequela (HCAcker[place]  . Benign essential HTN   . Hypoalbuminemia due to protein-calorie malnutrition (HCGlen [place]  . S/P below knee amputation, right (HCBosque Farms04/03/2017  . Acute blood loss anemia   . Other encephalopathy 04/08/2017  . Encephalopathy 04/08/2017  . Altered mental status 04/08/2017  . Labile blood pressure   . Labile blood glucose   . Drug induced constipation   . Stage 3 chronic kidney disease (HCIrvine  .  Bacteremia   . S/P unilateral BKA (below knee amputation), right ([place]) 04/04/2017  . PAD (peripheral artery disease) ([place])   . Type 2 diabetes mellitus with right diabetic foot ulcer ([place])   . Post-operative pain   . Legally blind   . Upper GI bleed   . Streptococcal bacteremia 04/01/2017  . Hypokalemia 03/30/2017  . Uncontrolled type 2 diabetes mellitus with hyperglycemia, with long-term current use of insulin ([place]) 03/30/2017  . Type 2 diabetes mellitus with peripheral neuropathy ([place]) 03/30/2017  . AKI (acute kidney injury) ([place]) 03/30/2017  . CKD stage 3 due to type 2 diabetes mellitus ([place]) 03/30/2017  . Sepsis ([place]) 03/30/2017  . Heart murmur 11/22/2016  . Hyperlipidemia associated with type 2 diabetes mellitus ([place]) 11/22/2016  . Obstructive sleep apnea syndrome 11/22/2016  . Essential hypertension 12/17/2012   Phillips Grout PT, DPT, GCS  Katrisha Segall 12/26/2017, 12:47 PM  [place] MAIN [facility] SERVICES [address] [place], [place], [address] Phone: [phone]   Fax:  [phone]  Name: Trevor [name] MRN: [id number] Date of Birth: Dec 04, 1955

## 2017-12-27 ENCOUNTER — Ambulatory Visit: Payer: Medicare Other

## 2017-12-27 VITALS — BP 130/73 | HR 57

## 2017-12-27 DIAGNOSIS — R2681 Unsteadiness on feet: Secondary | ICD-10-CM

## 2017-12-27 DIAGNOSIS — M6281 Muscle weakness (generalized): Secondary | ICD-10-CM | POA: Diagnosis not present

## 2017-12-27 NOTE — Therapy (Signed)
[place] MAIN [facility] SERVICES [address] [place], [place], [address] Phone: [phone]   Fax:  [phone]  Physical Therapy Treatment  Patient Details  Name: Trevor [name] MRN: [id number] Date of Birth: 04-May-1955 Referring Provider (PT): Jamse Arn, DR   Encounter Date: 12/27/2017  PT End of Session - 12/27/17 1038    Visit Number  25    Number of Visits  43    Date for PT Re-Evaluation  02/05/18    Authorization Type   PN 5/10 start 11/14/17, goals updated 12/11/17    PT Start Time  1035    PT Stop Time  1115    PT Time Calculation (min)  40 min    Equipment Utilized During Treatment  Gait belt;Other (comment)   R prosthesis   Activity Tolerance  Patient tolerated treatment well    Behavior During Therapy  WFL for tasks assessed/performed       Past Medical History:  Diagnosis Date  . Diabetes mellitus without complication ([place])   . Heart murmur   . Hyperlipidemia   . Hypertension   . Sleep apnea     Past Surgical History:  Procedure Laterality Date  . AMPUTATION Right 03/31/2017   Procedure: RIGHT FOOT 1ST AND 2ND RAY AMPUTATION;  Surgeon: Newt Minion, MD;  Location: [place];  Service: Orthopedics;  Laterality: Right;  . AMPUTATION Right 04/01/2017   Procedure: AMPUTATION BELOW KNEE;  Surgeon: Newt Minion, MD;  Location: [place];  Service: Orthopedics;  Laterality: Right;  . COLONOSCOPY WITH PROPOFOL N/A 10/02/2017   Procedure: COLONOSCOPY WITH PROPOFOL;  Surgeon: [name], MD;  Location: [facility] ENDOSCOPY;  Service: Gastroenterology;  Laterality: N/A;  . CORNEAL TRANSPLANT      Vitals:   12/27/17 1038  BP: 130/73  Pulse: (!) 57  SpO2: 99%    Subjective Assessment - 12/27/17 1038    Subjective  Pt states that he is doing well today. He denies any pain in his RLE upon arrival. No specific questions or concerns at this time. He does state that he had some upper R thigh pain yesterday and apparently he has  been having this discomfort since his surgery but never mentioned it to this therapist.     Pertinent History  62 year old right-handed male with history of diabetes mellitus, hypertension, legally blind, and CKD presenting for BKA 04/01/17. Patient wears contacts and can see better now.  Patient had two surgeries, one was a couple toes then they decided to go below the knee. 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 Has ambulated with RW in hospital with PT. Patient has had home health therapy and was in inpatient rehab about a month. Patient reports he was mainly sitting down, with occasional walking, last home health therapy in May. Patient reports some standing up but no exercises. Patient lives alone in the house and is scared they might fall. Patient received prosthetic leg Tuesday of last week.    Limitations  Standing;Walking;House hold activities;Other (comment)    How long can you stand comfortably?  10-15 minutes    How long can you walk comfortably?  10-15 minutes    Patient Stated Goals  going up steps and driving car.     Currently in Pain?  No/denies          TREATMENT  Donning of prosthesis  Therapeutic Exercise: NuStep L1-L4  x 5 minutes for warm-up during history;  Supine R hip extension stretch off edge of table 30s hold x 2 with extensive education with patient regarding how he can perform at home with proper technique. Discussed options for prone hip extension stretching as well. Therapist is able to reproduce patient's upper thigh pain with R hip extension stretch; Hooklying bridges x 10;   Gait Training: Extensive gait trainingin hallway with intermittent use of spc but90%of distance performed without anassistive device.CGAduring gait.Pt is able to ambulate approximately 700'with one seated rest breaks.Pt provided cues to increase L step length as well as for upright posture. Fatigue  monitored during ambulation. Cues to perform horizontal and vertical head turns during ambulation.    Doffing of prosthesis   Pt educated throughout session about proper posture and technique with exercises. Improved exercise technique, movement at target joints, use of target muscles after min to mod verbal, visual, tactile cues.   Pt continues to be able to ambulate in hallway with minimal use of assistive device. He is reporting upper R thigh pain during ambulation which is reproduced with hip extension stretch off edge of table which was added to his HEP. Utilized NuStep today to challenge his cardiopulmonary conditioning and he would likely be able to handle greater resistance at next session. Patient will continue to benefit from skilled physical therapy to improve functional mobility, improve prosthetic use, and increase ambulation to increase his functional independence and improve overall QOL.                       PT Short Term Goals - 12/11/17 1058      PT SHORT TERM GOAL #1   Title  Patient will be independent in home exercise program to improve strength/mobility for better functional independence with ADLs.    Baseline  HEP given, 12/11/17: Performing daily:     Time  2    Period  Weeks    Status  Achieved      PT SHORT TERM GOAL #2   Title  Patient will don/doff prosthesis independently to allow for increased mobility in home.     Baseline  8/14: requires PT to guide/direct through process 8/28: independent    Time  2    Period  Weeks    Status  Achieved      PT SHORT TERM GOAL #3   Title  Patient will ambulate 10 meters with least assistive device and prosthesis to improve mobility in home.     Baseline  8/14: not walking outside of // bars yet  8/28: 63 seconds with RW and CGA     Time  2    Period  Weeks    Status  Achieved      PT SHORT TERM GOAL #4   Title  Patient will perform STS with single UE support to decrease reliance upon UE's  for stability     Baseline  patient requires BUE support 11/5: 1 UR support    Time  2    Period  Weeks    Status  Achieved        PT Long Term Goals - 12/11/17 1059      PT LONG TERM GOAL #1   Title  Patient will ambulate 60 ft with least assistive AD and prosthesis to improve mobility around home and increase independence.     Baseline  8/14: requires use of // bars 8/28: 30 ft with RW and CGA 9/17: 74  ft x2 with CGA and RW     Time  4    Period  Weeks    Status  Achieved      PT LONG TERM GOAL #2   Title  Patient (> 50 years old) will complete five times sit to stand test in < 15 seconds indicating an increased LE strength and improved balance.    Baseline  8/14: 61 seconds 8/28; 42 seconds 9/17: 24 seconds BUE support with LLE 10/10: 17 seconds with excessive BUE support  11/5: 18seconds BUE support; 12/11/17: 12/11/17: 18.5s with BUE support but no AD    Time  4    Period  Weeks    Status  Partially Met    Target Date  02/05/18      PT LONG TERM GOAL #3   Title  Patient will reduce timed up and go to <11 seconds to reduce fall risk and demonstrate improved transfer/gait ability.    Baseline  8/14: unable to perform 8/28: 1 min 30 seconds 9/17: 38 seconds RW 10/10: 25 seconds with RW 11/5: 20 seconds with cane;, 12/11/17: 17.4s with spc    Time  4    Period  Weeks    Status  On-going    Target Date  02/05/18      PT LONG TERM GOAL #4   Title  Patient will increase lower extremity functional scale to >60/80 to demonstrate improved functional mobility and increased tolerance with ADLs.     Baseline  814: 29/80 8/28: 20/80 10/10: 20/80 11/5: 22/80; 12/11/17: 18/80    Time  4    Period  Weeks    Status  On-going    Target Date  02/05/18      PT LONG TERM GOAL #5   Title  Patient will increase BLE gross strength to 4+/5 as to improve functional strength for independent gait, increased standing tolerance and increased ADL ability.    Baseline  8/14: 4-/15 8/28: 4-/5  9/17: L  4-/5 R 4/5      Time  4    Period  Weeks    Status  Achieved      PT LONG TERM GOAL #6   Title  Patient will perform 10 MWT in >.5 m/s for improved mobility and safety with negotiating natural environment    Baseline  8/28: .16 m/s with RW  9/17: .53 m/s     Time  4    Period  Weeks    Status  Achieved      PT LONG TERM GOAL #7   Title  Patient will perform 10 MWT in >1.0 m/s for improved mobility and safety with negotiating natural environment    Baseline  9/17: .53 m/s 10/10: .63 m/s with RW 11/5: 0.76ms; 12/11/17: 15.33s = 0.65 m/s with spc    Time  4    Period  Weeks    Status  Partially Met      PT LONG TERM GOAL #8   Title  Patient will ambulate 300 ft with least assistive AD and prosthesis to improve mobility around home and increase independence.     Baseline  9/17: 74 ft CGA with RW 10/10 ambulated 280 ft with RW, [facility]: ambulate [id number] 10/31    Time  4    Period  Weeks    Status  Achieved      PT LONG TERM GOAL  #9   TITLE  Patient will ascend/descend 4 stairs  with rail assist independently without loss of balance to improve ability to get in/out of home.     Baseline  9/17: unable to negotiate stairs 10/10: ascend with step to pattern, BUE support, and CGA 11/5: patient demonstrates ability to perform CGA and BUE support    Time  4    Period  Weeks    Status  Achieved      PT LONG TERM GOAL  #10   TITLE  Patient will ascend/descend 4 stairs with rail assist independently with reciprocating pattern without loss of balance to improve ability to get in/out of home.     Baseline  11/5: step to pattern BUE support CGA; 12/11/17: BUE heavy support and reciprocal pattern;    Time  4    Period  Weeks    Status  Achieved      PT LONG TERM GOAL  #11   TITLE  Patient will ambulate >1038f during 658m with LRAD to demonstrate improved community ambulation and functional mobility     Baseline  11/5: will complete next session; 12/11/17    Time  4    Period   Weeks    Status  Deferred    Target Date  02/05/18            Plan - 12/27/17 1039    Clinical Impression Statement  Pt continues to be able to ambulate in hallway with minimal use of assistive device. He is reporting upper R thigh pain during ambulation which is reproduced with hip extension stretch off edge of table which was added to his HEP. Utilized NuStep today to challenge his cardiopulmonary conditioning and he would likely be able to handle greater resistance at next session. Patient will continue to benefit from skilled physical therapy to improve functional mobility, improve prosthetic use, and increase ambulation to increase his functional independence and improve overall QOL.    Rehab Potential  Fair    Clinical Impairments Affecting Rehab Potential  (+) previous independence, motivation to return to walking (-) lives alone, hx of diabetes, limited vision     PT Frequency  2x / week    PT Duration  8 weeks    PT Treatment/Interventions  ADLs/Self Care Home Management;Cryotherapy;Electrical Stimulation;Ultrasound;Traction;Moist Heat;DME Instruction;Gait training;Stair training;Functional mobility training;Therapeutic activities;Therapeutic exercise;Patient/family education;Neuromuscular re-education;Balance training;Prosthetic Training;Wheelchair mobility training;Manual techniques;Manual lymph drainage;Compression bandaging;Taping;Energy conservation;Passive range of motion    PT Next Visit Plan  gait training, strengthening, balance    PT Home Exercise Plan  as prescribed    Consulted and Agree with Plan of Care  Patient       Patient will benefit from skilled therapeutic intervention in order to improve the following deficits and impairments:  Abnormal gait, Decreased activity tolerance, Decreased balance, Decreased knowledge of precautions, Decreased endurance, Decreased knowledge of use of DME, Decreased mobility, Decreased range of motion, Difficulty walking, Decreased  strength, Increased edema, Impaired flexibility, Impaired perceived functional ability, Prosthetic Dependency, Postural dysfunction, Improper body mechanics, Pain  Visit Diagnosis: Muscle weakness (generalized)  Unsteadiness on feet     Problem List Patient Active Problem List   Diagnosis Date Noted  . Abnormality of gait 10/12/2017  . Poorly controlled type 2 diabetes mellitus with peripheral neuropathy ([place]  . Flatulence   . Hypomagnesemia   . Unilateral complete BKA, right, sequela ([facility]  . Benign essential HTN   . Hypoalbuminemia due to protein-calorie malnutrition ([facility]  . S/P below knee amputation, right ([place] Lakes04/03/2017  . Acute blood loss anemia   .  Other encephalopathy 04/08/2017  . Encephalopathy 04/08/2017  . Altered mental status 04/08/2017  . Labile blood pressure   . Labile blood glucose   . Drug induced constipation   . Stage 3 chronic kidney disease ([place])   . Bacteremia   . S/P unilateral BKA (below knee amputation), right ([place]) 04/04/2017  . PAD (peripheral artery disease) ([place])   . Type 2 diabetes mellitus with right diabetic foot ulcer ([place])   . Post-operative pain   . Legally blind   . Upper GI bleed   . Streptococcal bacteremia 04/01/2017  . Hypokalemia 03/30/2017  . Uncontrolled type 2 diabetes mellitus with hyperglycemia, with long-term current use of insulin ([place]) 03/30/2017  . Type 2 diabetes mellitus with peripheral neuropathy ([place]) 03/30/2017  . AKI (acute kidney injury) ([place]) 03/30/2017  . CKD stage 3 due to type 2 diabetes mellitus ([place]) 03/30/2017  . Sepsis ([place]) 03/30/2017  . Heart murmur 11/22/2016  . Hyperlipidemia associated with type 2 diabetes mellitus ([place]) 11/22/2016  . Obstructive sleep apnea syndrome 11/22/2016  . Essential hypertension 12/17/2012   Phillips Grout PT, DPT, GCS  Alejos Reinhardt 12/27/2017, 3:06 PM  [place] MAIN [facility] SERVICES [address] [place], [place],  [address] Phone: [phone]   Fax:  [phone]  Name: Trevor [name] MRN: [id number] Date of Birth: [birth year]

## 2018-01-01 ENCOUNTER — Ambulatory Visit: Payer: Medicare Other

## 2018-01-01 DIAGNOSIS — M6281 Muscle weakness (generalized): Secondary | ICD-10-CM | POA: Diagnosis not present

## 2018-01-01 DIAGNOSIS — S88111S Complete traumatic amputation at level between knee and ankle, right lower leg, sequela: Secondary | ICD-10-CM

## 2018-01-01 DIAGNOSIS — R2689 Other abnormalities of gait and mobility: Secondary | ICD-10-CM

## 2018-01-01 DIAGNOSIS — R2681 Unsteadiness on feet: Secondary | ICD-10-CM

## 2018-01-01 NOTE — Therapy (Signed)
[place] MAIN [facility] SERVICES [address] [place], [place], [address] Phone: [phone]   Fax:  [phone]  Physical Therapy Treatment  Patient Details  Name: Trevor [name] MRN: [id number] Date of Birth: 1955-06-12 Referring Provider (PT): Jamse Arn, DR   Encounter Date: 01/01/2018  PT End of Session - 01/01/18 1029    Visit Number  26    Number of Visits  43    Date for PT Re-Evaluation  02/05/18    Authorization Type   PN 6/10 start 11/14/17, goals updated 12/11/17    PT Start Time  1016    PT Stop Time  1102    PT Time Calculation (min)  46 min    Equipment Utilized During Treatment  Gait belt;Other (comment)   R prosthesis   Activity Tolerance  Patient tolerated treatment well    Behavior During Therapy  WFL for tasks assessed/performed       Past Medical History:  Diagnosis Date  . Diabetes mellitus without complication ([place])   . Heart murmur   . Hyperlipidemia   . Hypertension   . Sleep apnea     Past Surgical History:  Procedure Laterality Date  . AMPUTATION Right 03/31/2017   Procedure: RIGHT FOOT 1ST AND 2ND RAY AMPUTATION;  Surgeon: Newt Minion, MD;  Location: [place];  Service: Orthopedics;  Laterality: Right;  . AMPUTATION Right 04/01/2017   Procedure: AMPUTATION BELOW KNEE;  Surgeon: Newt Minion, MD;  Location: [place];  Service: Orthopedics;  Laterality: Right;  . COLONOSCOPY WITH PROPOFOL N/A 10/02/2017   Procedure: COLONOSCOPY WITH PROPOFOL;  Surgeon: [name], MD;  Location: [facility];  Service: Gastroenterology;  Laterality: N/A;  . CORNEAL TRANSPLANT      There were no vitals filed for this visit.  Subjective Assessment - 01/01/18 1027    Subjective  Patient has no complaints at start of session, medications up to date. No falls, wants review HEP this session.    Pertinent History  62 year old right-handed male with history of diabetes mellitus, hypertension, legally blind, and CKD  presenting for BKA 04/01/17. Patient wears contacts and can see better now.  Patient had two surgeries, one was a couple toes then they decided to go below the knee. 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 Has ambulated with RW in hospital with PT. Patient has had home health therapy and was in inpatient rehab about a month. Patient reports he was mainly sitting down, with occasional walking, last home health therapy in May. Patient reports some standing up but no exercises. Patient lives alone in the house and is scared they might fall. Patient received prosthetic leg Tuesday of last week.    Limitations  Standing;Walking;House hold activities;Other (comment)    How long can you stand comfortably?  10-15 minutes    How long can you walk comfortably?  10-15 minutes    Patient Stated Goals  going up steps and driving car.     Currently in Pain?  No/denies       TREATMENT  Donning of prosthesis  Therapeutic Exercise: NuStep L6 then L4 due to pain in R patellar area x 5 minutes for warm-up during history;  Supine R hip extension stretch off edge of table 30s hold x 2 with minimal verbal cues, without prosthesis, pt reported improved stretch without.   PT and pt reviewed HEP, handout  administered. Seated marching x30 bilaterally  Seated LAQ 2x15 bilaterally Sit to stands 2x10 without UE use, verbal cues for increased weight bearing on RLE.   Gait Training: Extensive gait trainingin hallway without use of spc for about 86f with one seated rest breaks at end of mobility.Pt provided cues to increase L step length as well as for upright posture. Fatigue monitored during ambulation. Able to initiate side stepping without use of AD. Cues for R hip flexion/knee flexion to improve foot clearance. Exhibited fatigue after 2 laps x146f  Doffing of prosthesis    PT Education - 01/01/18 1028    Education Details   HEP, exercise technique and weight bearing through RLE as much as possible, gait mechanics    Person(s) Educated  Patient    Methods  Explanation;Demonstration    Comprehension  Verbalized understanding       PT Short Term Goals - 12/11/17 1058      PT SHORT TERM GOAL #1   Title  Patient will be independent in home exercise program to improve strength/mobility for better functional independence with ADLs.    Baseline  HEP given, 12/11/17: Performing daily:     Time  2    Period  Weeks    Status  Achieved      PT SHORT TERM GOAL #2   Title  Patient will don/doff prosthesis independently to allow for increased mobility in home.     Baseline  8/14: requires PT to guide/direct through process 8/28: independent    Time  2    Period  Weeks    Status  Achieved      PT SHORT TERM GOAL #3   Title  Patient will ambulate 10 meters with least assistive device and prosthesis to improve mobility in home.     Baseline  8/14: not walking outside of // bars yet  8/28: 63 seconds with RW and CGA     Time  2    Period  Weeks    Status  Achieved      PT SHORT TERM GOAL #4   Title  Patient will perform STS with single UE support to decrease reliance upon UE's for stability     Baseline  patient requires BUE support 11/5: 1 UR support    Time  2    Period  Weeks    Status  Achieved        PT Long Term Goals - 12/11/17 1059      PT LONG TERM GOAL #1   Title  Patient will ambulate 60 ft with least assistive AD and prosthesis to improve mobility around home and increase independence.     Baseline  8/14: requires use of // bars 8/28: 30 ft with RW and CGA 9/17: 74 ft x2 with CGA and RW     Time  4    Period  Weeks    Status  Achieved      PT LONG TERM GOAL #2   Title  Patient (> 606ears old) will complete five times sit to stand test in < 15 seconds indicating an increased LE strength and improved balance.    Baseline  8/14: 61 seconds 8/28; 42 seconds 9/17: 24 seconds BUE support with LLE  10/10: 17 seconds with excessive BUE support  11/5: 18seconds BUE support; 12/11/17: 12/11/17: 18.5s with BUE support but no AD    Time  4    Period  Weeks    Status  Partially Met  Target Date  02/05/18      PT LONG TERM GOAL #3   Title  Patient will reduce timed up and go to <11 seconds to reduce fall risk and demonstrate improved transfer/gait ability.    Baseline  8/14: unable to perform 8/28: 1 min 30 seconds 9/17: 38 seconds RW 10/10: 25 seconds with RW 11/5: 20 seconds with cane;, 12/11/17: 17.4s with spc    Time  4    Period  Weeks    Status  On-going    Target Date  02/05/18      PT LONG TERM GOAL #4   Title  Patient will increase lower extremity functional scale to >60/80 to demonstrate improved functional mobility and increased tolerance with ADLs.     Baseline  814: 29/80 8/28: 20/80 10/10: 20/80 11/5: 22/80; 12/11/17: 18/80    Time  4    Period  Weeks    Status  On-going    Target Date  02/05/18      PT LONG TERM GOAL #5   Title  Patient will increase BLE gross strength to 4+/5 as to improve functional strength for independent gait, increased standing tolerance and increased ADL ability.    Baseline  8/14: 4-/15 8/28: 4-/5  9/17: L 4-/5 R 4/5      Time  4    Period  Weeks    Status  Achieved      PT LONG TERM GOAL #6   Title  Patient will perform 10 MWT in >.5 m/s for improved mobility and safety with negotiating natural environment    Baseline  8/28: .16 m/s with RW  9/17: .53 m/s     Time  4    Period  Weeks    Status  Achieved      PT LONG TERM GOAL #7   Title  Patient will perform 10 MWT in >1.0 m/s for improved mobility and safety with negotiating natural environment    Baseline  9/17: .53 m/s 10/10: .63 m/s with RW 11/5: 0.14ms; 12/11/17: 15.33s = 0.65 m/s with spc    Time  4    Period  Weeks    Status  Partially Met      PT LONG TERM GOAL #8   Title  Patient will ambulate 300 ft with least assistive AD and prosthesis to improve mobility around home and  increase independence.     Baseline  9/17: 74 ft CGA with RW 10/10 ambulated 280 ft with RW, [facility]: ambulate [id number] 10/31    Time  4    Period  Weeks    Status  Achieved      PT LONG TERM GOAL  #9   TITLE  Patient will ascend/descend 4 stairs with rail assist independently without loss of balance to improve ability to get in/out of home.     Baseline  9/17: unable to negotiate stairs 10/10: ascend with step to pattern, BUE support, and CGA 11/5: patient demonstrates ability to perform CGA and BUE support    Time  4    Period  Weeks    Status  Achieved      PT LONG TERM GOAL  #10   TITLE  Patient will ascend/descend 4 stairs with rail assist independently with reciprocating pattern without loss of balance to improve ability to get in/out of home.     Baseline  11/5: step to pattern BUE support CGA; 12/11/17: BUE heavy support and reciprocal pattern;  Time  4    Period  Weeks    Status  Achieved      PT LONG TERM GOAL  #11   TITLE  Patient will ambulate >1081f during 674m with LRAD to demonstrate improved community ambulation and functional mobility     Baseline  11/5: will complete next session; 12/11/17    Time  4    Period  Weeks    Status  Deferred    Target Date  02/05/18            Plan - 01/01/18 1112    Clinical Impression Statement  Patient and PT reviewed and demonstrated HEP, administered handout to improve compliance. Pt appropriately challenged by exercises. Pt able to ambulate in hall ~[id number] to sitting rest break while carrying AD. Cues for upright posture, occasional LLE step length. Inititated sideways stepping with CGA for strengthening of hips/balance.    Rehab Potential  Fair    Clinical Impairments Affecting Rehab Potential  (+) previous independence, motivation to return to walking (-) lives alone, hx of diabetes, limited vision     PT Frequency  2x / week    PT Duration  8 weeks    PT Treatment/Interventions  ADLs/Self Care Home  Management;Cryotherapy;Electrical Stimulation;Ultrasound;Traction;Moist Heat;DME Instruction;Gait training;Stair training;Functional mobility training;Therapeutic activities;Therapeutic exercise;Patient/family education;Neuromuscular re-education;Balance training;Prosthetic Training;Wheelchair mobility training;Manual techniques;Manual lymph drainage;Compression bandaging;Taping;Energy conservation;Passive range of motion    PT Next Visit Plan  gait training, strengthening, balance    PT Home Exercise Plan  updated, see [email or web address] on medbridge    Consulted and Agree with Plan of Care  Patient       Patient will benefit from skilled therapeutic intervention in order to improve the following deficits and impairments:  Abnormal gait, Decreased activity tolerance, Decreased balance, Decreased knowledge of precautions, Decreased endurance, Decreased knowledge of use of DME, Decreased mobility, Decreased range of motion, Difficulty walking, Decreased strength, Increased edema, Impaired flexibility, Impaired perceived functional ability, Prosthetic Dependency, Postural dysfunction, Improper body mechanics, Pain  Visit Diagnosis: Muscle weakness (generalized)  Unsteadiness on feet  Other abnormalities of gait and mobility  Unilateral complete BKA, right, sequela (HCC[place]iletz   Problem List Patient Active Problem List   Diagnosis Date Noted  . Abnormality of gait 10/12/2017  . Poorly controlled type 2 diabetes mellitus with peripheral neuropathy (HCCJackson . Flatulence   . Hypomagnesemia   . Unilateral complete BKA, right, sequela ([place]CC[place] . Benign essential HTN   . Hypoalbuminemia due to protein-calorie malnutrition (HCCFl[place] . S/P below knee amputation, right (HCCWell[place]4/03/2017  . Acute blood loss anemia   . Other encephalopathy 04/08/2017  . Encephalopathy 04/08/2017  . Altered mental status 04/08/2017  . Labile blood pressure   . Labile blood glucose   . Drug induced constipation   . Stage 3  chronic kidney disease (HCCD[place] . Bacteremia   . S/P unilateral BKA (below knee amputation), right (HCCBradenville3/26/2019  . PAD (peripheral artery disease) (HCCDand[place] . Type 2 diabetes mellitus with right diabetic foot ulcer (HCCL[place] . Post-operative pain   . Legally blind   . Upper GI bleed   . Streptococcal bacteremia 04/01/2017  . Hypokalemia 03/30/2017  . Uncontrolled type 2 diabetes mellitus with hyperglycemia, with long-term current use of insulin (HCCBirmingham3/21/2019  . Type 2 diabetes mellitus with peripheral neuropathy (HCCLar[place]3/21/2019  . AKI (acute kidney injury) (HCCOrin3/21/2019  . CKD stage 3 due to type 2 diabetes mellitus (  [place]) 03/30/2017  . Sepsis ([place]) 03/30/2017  . Heart murmur 11/22/2016  . Hyperlipidemia associated with type 2 diabetes mellitus ([place]) 11/22/2016  . Obstructive sleep apnea syndrome 11/22/2016  . Essential hypertension 12/17/2012    Lieutenant Diego PT, DPT 11:22 AM,01/01/18 [place] MAIN [facility] SERVICES [address] [place], [place], [address] Phone: [phone]   Fax:  [phone]  Name: RUPERTO KIERNAN MRN: [id number] Date of Birth: [birth year]

## 2018-01-01 NOTE — Patient Instructions (Signed)
Access Code: [email or web address]  URL: [email or web address]  Date: 01/01/2018  Prepared by: Lieutenant Diego   Exercises  Seated March - 30 reps - 1 sets - 5 hold - 1x daily - 5x weekly  Seated Long Arc Quad - 30 reps - 1 sets - 5 hold - 1x daily - 5x weekly  Bridge - 10 reps - 3 sets - 1x daily - 5x weekly  Hip Flexor Stretch at [employer] of Bed - 3 reps - 1 sets - 30 hold - 1x daily - 7x weekly  Sit to Stand without Arm Support - 10 reps - 2 sets - 1x daily - 7x weekly  Sideways Walking - 1x daily - 7x weekly  Walking - 1x daily - 7x weekly

## 2018-01-09 ENCOUNTER — Ambulatory Visit: Payer: Medicare Other

## 2018-01-09 DIAGNOSIS — M6281 Muscle weakness (generalized): Secondary | ICD-10-CM | POA: Diagnosis not present

## 2018-01-09 DIAGNOSIS — R2681 Unsteadiness on feet: Secondary | ICD-10-CM

## 2018-01-09 DIAGNOSIS — R2689 Other abnormalities of gait and mobility: Secondary | ICD-10-CM

## 2018-01-09 NOTE — Therapy (Signed)
[place] MAIN [facility] SERVICES [address] [place], [place], [address] Phone: [phone]   Fax:  [phone]  Physical Therapy Treatment  Patient Details  Name: Trevor [name] MRN: [id number] Date of Birth: March 23, 1955 Referring Provider (PT): Jamse Arn, DR   Encounter Date: 01/09/2018  PT End of Session - 01/10/18 1300    Visit Number  27    Number of Visits  43    Date for PT Re-Evaluation  02/05/18    Authorization Type   PN 7/10 start 11/14/17, goals updated 12/11/17    PT Start Time  1302    PT Stop Time  1345    PT Time Calculation (min)  43 min    Equipment Utilized During Treatment  Gait belt;Other (comment)   R prosthesis   Activity Tolerance  Patient tolerated treatment well    Behavior During Therapy  WFL for tasks assessed/performed       Past Medical History:  Diagnosis Date  . Diabetes mellitus without complication ([place])   . Heart murmur   . Hyperlipidemia   . Hypertension   . Sleep apnea     Past Surgical History:  Procedure Laterality Date  . AMPUTATION Right 03/31/2017   Procedure: RIGHT FOOT 1ST AND 2ND RAY AMPUTATION;  Surgeon: Newt Minion, MD;  Location: [place];  Service: Orthopedics;  Laterality: Right;  . AMPUTATION Right 04/01/2017   Procedure: AMPUTATION BELOW KNEE;  Surgeon: Newt Minion, MD;  Location: [place];  Service: Orthopedics;  Laterality: Right;  . COLONOSCOPY WITH PROPOFOL N/A 10/02/2017   Procedure: COLONOSCOPY WITH PROPOFOL;  Surgeon: [name], MD;  Location: [facility] ENDOSCOPY;  Service: Gastroenterology;  Laterality: N/A;  . CORNEAL TRANSPLANT      There were no vitals filed for this visit.  Subjective Assessment - 01/10/18 1259    Subjective  Patient has no complaints at start of session. No falls. He has been ambulating with his cane at home. No specific questions or concerns.    Pertinent History  62 year old right-handed male with history of diabetes mellitus, hypertension,  legally blind, and CKD presenting for BKA 04/01/17. Patient wears contacts and can see better now.  Patient had two surgeries, one was a couple toes then they decided to go below the knee. 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 Has ambulated with RW in hospital with PT. Patient has had home health therapy and was in inpatient rehab about a month. Patient reports he was mainly sitting down, with occasional walking, last home health therapy in May. Patient reports some standing up but no exercises. Patient lives alone in the house and is scared they might fall. Patient received prosthetic leg Tuesday of last week.    Limitations  Standing;Walking;House hold activities;Other (comment)    How long can you stand comfortably?  10-15 minutes    How long can you walk comfortably?  10-15 minutes    Patient Stated Goals  going up steps and driving car.     Currently in Pain?  No/denies         TREATMENT  Donning of prosthesis   Therapeutic Exercise: Sit to stand without UE support from elevated mat table 2 x 10; Seated hip flexion marches alternating LE 2 x 10; Seated clams with manual resistance 2 x 10; Seated adductor squeeze with manual resistance 2 x 10; Seated R LAQ with  manual resistance 2 x 10; Seated R HS curls with manual resistance 2 x 10;   Gait Training Gait training with biofeedback on treadmill with bilateral UE support with frequent changes in speed as well as regular cues for increased step length. Pt able to ambulate for 2 minutes then requires a seated rest break followed by an additional 3 minutes. Pt is very easily fatigued with ambulation. BUE assist on treadmill during ambulation;   Neuromuscular Re-education  Toe taps to 6" step without UE support x 10 each; Airex balance NBOS with dynamic reaching to targets on mirror as therapist calls out; Airex balloon taps on mirror x 4 minutes for dynamic  balance challenge.   Doffing of prosthesis   Pt educated throughout session about proper posture and technique with exercises. Improved exercise technique, movement at target joints, use of target muscles after min to mod verbal, visual, tactile cues.   Pt continues to demonstrate excellent motivation during session. He is able to ambulate on the treadmill and work on improving his step length with cues from therapist as well as biofeedback from gait trainer on treadmill. He demonstrates improving single leg balance during toe taps and his strength continues to improve. He will need updated outcome measures at next sesion. Patient will continue to benefit from skilled physical therapy to improve functional mobility, improve prosthetic use, and increase ambulation to increasehis functional independenceand improve overall QOL.                        PT Short Term Goals - 12/11/17 1058      PT SHORT TERM GOAL #1   Title  Patient will be independent in home exercise program to improve strength/mobility for better functional independence with ADLs.    Baseline  HEP given, 12/11/17: Performing daily:     Time  2    Period  Weeks    Status  Achieved      PT SHORT TERM GOAL #2   Title  Patient will don/doff prosthesis independently to allow for increased mobility in home.     Baseline  8/14: requires PT to guide/direct through process 8/28: independent    Time  2    Period  Weeks    Status  Achieved      PT SHORT TERM GOAL #3   Title  Patient will ambulate 10 meters with least assistive device and prosthesis to improve mobility in home.     Baseline  8/14: not walking outside of // bars yet  8/28: 63 seconds with RW and CGA     Time  2    Period  Weeks    Status  Achieved      PT SHORT TERM GOAL #4   Title  Patient will perform STS with single UE support to decrease reliance upon UE's for stability     Baseline  patient requires BUE support 11/5: 1 UR support     Time  2    Period  Weeks    Status  Achieved        PT Long Term Goals - 12/11/17 1059      PT LONG TERM GOAL #1   Title  Patient will ambulate 60 ft with least assistive AD and prosthesis to improve mobility around home and increase independence.     Baseline  8/14: requires use of // bars 8/28: 30 ft with RW and CGA 9/17: 74 ft x2 with CGA and RW  Time  4    Period  Weeks    Status  Achieved      PT LONG TERM GOAL #2   Title  Patient (> 33 years old) will complete five times sit to stand test in < 15 seconds indicating an increased LE strength and improved balance.    Baseline  8/14: 61 seconds 8/28; 42 seconds 9/17: 24 seconds BUE support with LLE 10/10: 17 seconds with excessive BUE support  11/5: 18seconds BUE support; 12/11/17: 12/11/17: 18.5s with BUE support but no AD    Time  4    Period  Weeks    Status  Partially Met    Target Date  02/05/18      PT LONG TERM GOAL #3   Title  Patient will reduce timed up and go to <11 seconds to reduce fall risk and demonstrate improved transfer/gait ability.    Baseline  8/14: unable to perform 8/28: 1 min 30 seconds 9/17: 38 seconds RW 10/10: 25 seconds with RW 11/5: 20 seconds with cane;, 12/11/17: 17.4s with spc    Time  4    Period  Weeks    Status  On-going    Target Date  02/05/18      PT LONG TERM GOAL #4   Title  Patient will increase lower extremity functional scale to >60/80 to demonstrate improved functional mobility and increased tolerance with ADLs.     Baseline  814: 29/80 8/28: 20/80 10/10: 20/80 11/5: 22/80; 12/11/17: 18/80    Time  4    Period  Weeks    Status  On-going    Target Date  02/05/18      PT LONG TERM GOAL #5   Title  Patient will increase BLE gross strength to 4+/5 as to improve functional strength for independent gait, increased standing tolerance and increased ADL ability.    Baseline  8/14: 4-/15 8/28: 4-/5  9/17: L 4-/5 R 4/5      Time  4    Period  Weeks    Status  Achieved      PT LONG  TERM GOAL #6   Title  Patient will perform 10 MWT in >.5 m/s for improved mobility and safety with negotiating natural environment    Baseline  8/28: .16 m/s with RW  9/17: .53 m/s     Time  4    Period  Weeks    Status  Achieved      PT LONG TERM GOAL #7   Title  Patient will perform 10 MWT in >1.0 m/s for improved mobility and safety with negotiating natural environment    Baseline  9/17: .53 m/s 10/10: .63 m/s with RW 11/5: 0.54ms; 12/11/17: 15.33s = 0.65 m/s with spc    Time  4    Period  Weeks    Status  Partially Met      PT LONG TERM GOAL #8   Title  Patient will ambulate 300 ft with least assistive AD and prosthesis to improve mobility around home and increase independence.     Baseline  9/17: 74 ft CGA with RW 10/10 ambulated 280 ft with RW, [facility]: ambulate 4641fon 10/31    Time  4    Period  Weeks    Status  Achieved      PT LONG TERM GOAL  #9   TITLE  Patient will ascend/descend 4 stairs with rail assist independently without loss of balance to improve  ability to get in/out of home.     Baseline  9/17: unable to negotiate stairs 10/10: ascend with step to pattern, BUE support, and CGA 11/5: patient demonstrates ability to perform CGA and BUE support    Time  4    Period  Weeks    Status  Achieved      PT LONG TERM GOAL  #10   TITLE  Patient will ascend/descend 4 stairs with rail assist independently with reciprocating pattern without loss of balance to improve ability to get in/out of home.     Baseline  11/5: step to pattern BUE support CGA; 12/11/17: BUE heavy support and reciprocal pattern;    Time  4    Period  Weeks    Status  Achieved      PT LONG TERM GOAL  #11   TITLE  Patient will ambulate >1041f during 675m with LRAD to demonstrate improved community ambulation and functional mobility     Baseline  11/5: will complete next session; 12/11/17    Time  4    Period  Weeks    Status  Deferred    Target Date  02/05/18            Plan -  01/10/18 1329    Clinical Impression Statement  Pt continues to demonstrate excellent motivation during session. He is able to ambulate on the treadmill and work on improving his step length with cues from therapist as well as biofeedback from gait trainer on treadmill. He demonstrates improving single leg balance during toe taps and his strength continues to improve. He will need updated outcome measures at next sesion. Patient will continue to benefit from skilled physical therapy to improve functional mobility, improve prosthetic use, and increase ambulation to increasehis functional independenceand improve overall QOL.    Rehab Potential  Fair    Clinical Impairments Affecting Rehab Potential  (+) previous independence, motivation to return to walking (-) lives alone, hx of diabetes, limited vision     PT Frequency  2x / week    PT Duration  8 weeks    PT Treatment/Interventions  ADLs/Self Care Home Management;Cryotherapy;Electrical Stimulation;Ultrasound;Traction;Moist Heat;DME Instruction;Gait training;Stair training;Functional mobility training;Therapeutic activities;Therapeutic exercise;Patient/family education;Neuromuscular re-education;Balance training;Prosthetic Training;Wheelchair mobility training;Manual techniques;Manual lymph drainage;Compression bandaging;Taping;Energy conservation;Passive range of motion    PT Next Visit Plan  outcome measures and goals, gait training, strengthening, balance    PT Home Exercise Plan  updated, see [id number] on medbridge    Consulted and Agree with Plan of Care  Patient       Patient will benefit from skilled therapeutic intervention in order to improve the following deficits and impairments:  Abnormal gait, Decreased activity tolerance, Decreased balance, Decreased knowledge of precautions, Decreased endurance, Decreased knowledge of use of DME, Decreased mobility, Decreased range of motion, Difficulty walking, Decreased strength, Increased edema,  Impaired flexibility, Impaired perceived functional ability, Prosthetic Dependency, Postural dysfunction, Improper body mechanics, Pain  Visit Diagnosis: Muscle weakness (generalized)  Unsteadiness on feet  Other abnormalities of gait and mobility     Problem List Patient Active Problem List   Diagnosis Date Noted  . Abnormality of gait 10/12/2017  . Poorly controlled type 2 diabetes mellitus with peripheral neuropathy ([facility]  . Flatulence   . Hypomagnesemia   . Unilateral complete BKA, right, sequela ([facility]  . Benign essential HTN   . Hypoalbuminemia due to protein-calorie malnutrition ([facility]  . S/P below knee amputation, right ([facility]04/03/2017  . Acute blood loss anemia   .  Other encephalopathy 04/08/2017  . Encephalopathy 04/08/2017  . Altered mental status 04/08/2017  . Labile blood pressure   . Labile blood glucose   . Drug induced constipation   . Stage 3 chronic kidney disease ([place])   . Bacteremia   . S/P unilateral BKA (below knee amputation), right ([place]) 04/04/2017  . PAD (peripheral artery disease) ([place])   . Type 2 diabetes mellitus with right diabetic foot ulcer ([place])   . Post-operative pain   . Legally blind   . Upper GI bleed   . Streptococcal bacteremia 04/01/2017  . Hypokalemia 03/30/2017  . Uncontrolled type 2 diabetes mellitus with hyperglycemia, with long-term current use of insulin ([place]) 03/30/2017  . Type 2 diabetes mellitus with peripheral neuropathy ([place]) 03/30/2017  . AKI (acute kidney injury) ([place]) 03/30/2017  . CKD stage 3 due to type 2 diabetes mellitus ([place]) 03/30/2017  . Sepsis ([place]) 03/30/2017  . Heart murmur 11/22/2016  . Hyperlipidemia associated with type 2 diabetes mellitus ([place]) 11/22/2016  . Obstructive sleep apnea syndrome 11/22/2016  . Essential hypertension 12/17/2012   Phillips Grout PT, DPT, GCS  Arvle Grabe 01/10/2018, 2:07 PM  [place] MAIN [facility] SERVICES [address]  [place], [place], [address] Phone: [phone]   Fax:  [phone]  Name: Trevor [name] MRN: [id number] Date of Birth: [birth year]

## 2018-01-11 ENCOUNTER — Ambulatory Visit (INDEPENDENT_AMBULATORY_CARE_PROVIDER_SITE_OTHER): Payer: Medicare Other | Admitting: Orthopedic Surgery

## 2018-01-11 ENCOUNTER — Encounter: Payer: Medicare Other | Attending: Physical Medicine & Rehabilitation | Admitting: Physical Medicine & Rehabilitation

## 2018-01-11 ENCOUNTER — Encounter: Payer: Self-pay | Admitting: Physical Medicine & Rehabilitation

## 2018-01-11 ENCOUNTER — Encounter (INDEPENDENT_AMBULATORY_CARE_PROVIDER_SITE_OTHER): Payer: Self-pay | Admitting: Orthopedic Surgery

## 2018-01-11 VITALS — BP 122/68 | HR 60 | Resp 14 | Ht 76.0 in | Wt 285.0 lb

## 2018-01-11 VITALS — Ht 76.0 in | Wt 285.0 lb

## 2018-01-11 DIAGNOSIS — Z89511 Acquired absence of right leg below knee: Secondary | ICD-10-CM | POA: Diagnosis not present

## 2018-01-11 DIAGNOSIS — E785 Hyperlipidemia, unspecified: Secondary | ICD-10-CM | POA: Diagnosis not present

## 2018-01-11 DIAGNOSIS — G4730 Sleep apnea, unspecified: Secondary | ICD-10-CM | POA: Insufficient documentation

## 2018-01-11 DIAGNOSIS — R269 Unspecified abnormalities of gait and mobility: Secondary | ICD-10-CM

## 2018-01-11 DIAGNOSIS — E1122 Type 2 diabetes mellitus with diabetic chronic kidney disease: Secondary | ICD-10-CM | POA: Insufficient documentation

## 2018-01-11 DIAGNOSIS — D649 Anemia, unspecified: Secondary | ICD-10-CM | POA: Insufficient documentation

## 2018-01-11 DIAGNOSIS — H548 Legal blindness, as defined in USA: Secondary | ICD-10-CM | POA: Diagnosis not present

## 2018-01-11 DIAGNOSIS — E876 Hypokalemia: Secondary | ICD-10-CM | POA: Insufficient documentation

## 2018-01-11 DIAGNOSIS — I129 Hypertensive chronic kidney disease with stage 1 through stage 4 chronic kidney disease, or unspecified chronic kidney disease: Secondary | ICD-10-CM | POA: Diagnosis not present

## 2018-01-11 DIAGNOSIS — Z4781 Encounter for orthopedic aftercare following surgical amputation: Secondary | ICD-10-CM | POA: Insufficient documentation

## 2018-01-11 DIAGNOSIS — N189 Chronic kidney disease, unspecified: Secondary | ICD-10-CM | POA: Insufficient documentation

## 2018-01-11 DIAGNOSIS — E1142 Type 2 diabetes mellitus with diabetic polyneuropathy: Secondary | ICD-10-CM | POA: Insufficient documentation

## 2018-01-11 DIAGNOSIS — S88111S Complete traumatic amputation at level between knee and ankle, right lower leg, sequela: Secondary | ICD-10-CM | POA: Diagnosis not present

## 2018-01-11 DIAGNOSIS — K921 Melena: Secondary | ICD-10-CM | POA: Insufficient documentation

## 2018-01-11 NOTE — Progress Notes (Addendum)
Subjective:    Patient ID: Trevor [name], male    DOB: 11-08-1955, 63 y.o.   MRN: [id number]  HPI 63 year old right-handed male with history of diabetes mellitus, hypertension, legally blind, and CKD presents for follow up for right BKA.    Last clinic visit 10/12/17.  Since last visit, pt states he received his prosthesis.  He is still in PT.  He saw Ortho and states good healing, may need adjustments with prosthesis.  BP is controlled.  Using walker for ambulation.  Denies falls.  Pain Inventory Average Pain 0 Pain Right Now 0 My pain is no pain  In the last 24 hours, has pain interfered with the following? General activity 0 Relation with others 0 Enjoyment of life 0 What TIME of day is your pain at its worst? no pain Sleep (in general) Fair  Pain is worse with: no pain Pain improves with: no pain Relief from Meds: no pain  Mobility walk with assistance use a walker ability to climb steps?  no do you drive?  no use a wheelchair transfers alone  Function not employed: date last employed . I need assistance with the following:  bathing, meal prep, household duties and shopping  Neuro/Psych numbness tingling  Physicians involved in your care hospital f/u   History reviewed. No pertinent family history. Social History   Socioeconomic History  . Marital status: Single    Spouse name: Not on file  . Number of children: Not on file  . Years of education: Not on file  . Highest education level: Not on file  Occupational History  . Not on file  Social Needs  . Financial resource strain: Not on file  . Food insecurity:    Worry: Not on file    Inability: Not on file  . Transportation needs:    Medical: Not on file    Non-medical: Not on file  Tobacco Use  . Smoking status: Never Smoker  . Smokeless tobacco: Never Used  Substance and Sexual Activity  . Alcohol use: No  . Drug use: Never  . Sexual activity: Not on file  Lifestyle  . Physical  activity:    Days per week: Not on file    Minutes per session: Not on file  . Stress: Not on file  Relationships  . Social connections:    Talks on phone: Not on file    Gets together: Not on file    Attends religious service: Not on file    Active member of club or organization: Not on file    Attends meetings of clubs or organizations: Not on file    Relationship status: Not on file  Other Topics Concern  . Not on file  Social History Narrative  . Not on file   Past Surgical History:  Procedure Laterality Date  . AMPUTATION Right 03/31/2017   Procedure: RIGHT FOOT 1ST AND 2ND RAY AMPUTATION;  Surgeon: Newt Minion, MD;  Location: [place];  Service: Orthopedics;  Laterality: Right;  . AMPUTATION Right 04/01/2017   Procedure: AMPUTATION BELOW KNEE;  Surgeon: Newt Minion, MD;  Location: [place];  Service: Orthopedics;  Laterality: Right;  . COLONOSCOPY WITH PROPOFOL N/A 10/02/2017   Procedure: COLONOSCOPY WITH PROPOFOL;  Surgeon: [name], MD;  [facility] ENDOSCOPY;  Service: Gastroenterology;  Laterality: N/A;  . CORNEAL TRANSPLANT     Past Medical History:  Diagnosis Date  . Diabetes mellitus without complication ([place])   . Heart murmur   .  Hyperlipidemia   . Hypertension   . Sleep apnea    BP 122/68   Pulse 60   Resp 14   Ht 6\' 4"  (1.93 m)   Wt 285 lb (129.3 kg)   SpO2 93%   BMI 34.69 kg/m   Opioid Risk Score:   Fall Risk Score:  `1  Depression screen PHQ 2/9  Depression screen [facility] 2/9 10/12/2017 08/10/2017 06/26/2017 05/11/2017 03/29/2017 11/22/2016  Decreased Interest 0 0 0 0 2 0  Down, Depressed, Hopeless 0 0 0 0 0 0  PHQ - 2 Score 0 0 0 0 2 0  Altered sleeping - - 1 1 0 1  Tired, decreased energy - - 1 0 3 1  Change in appetite - - - 0 1 0  Feeling bad or failure about yourself  - - 0 0 0 0  Trouble concentrating - - 0 0 0 0  Moving slowly or fidgety/restless - - 0 0 1 1  Suicidal thoughts - - 0 0 0 0  PHQ-9 Score - - 2 1 7 3     Review of Systems    Constitutional: Negative.   Eyes: Negative.   Respiratory: Negative.   Cardiovascular: Negative.   Gastrointestinal: Negative.   Endocrine: Negative.        High blood sugar  Genitourinary: Negative.   Musculoskeletal: Positive for gait problem.  Skin: Negative.   Allergic/Immunologic: Negative.   Neurological: Positive for numbness.       Tingling   All other systems reviewed and are negative.      Objective:   Physical Exam Constitutional: No distress . Vital signs reviewed. HEENT: EOMI, oral membranes moist Cardiovascular: RRR. No JVD    Respiratory: CTA bilaterally. Normal effort    GI: BS +, non-distended   Musculoskeletal: No edema or tenderness Gait: Slow cadence, mildly antalgic Neurological: He is alert and oriented.  Followed simple commands. Motor:  LLE: 5/5 proximal to distal  Right HF, KE: 5/5  Skin: DTI on patella Psychiatric: Alert and appropriate    Assessment & Plan:  63 year old rightight-handed male with history of diabetes mellitus, hypertension, legally blind, and CKD presents for follow up for right BKA.    1.  Right transtibial amputation   Cont PT for gait training  Cont to follow up with Ortho  Needs adjustment to prosthesis, pt to follow up for further adjustments  2. Gait abnormality  Cont Therapies   Cont walker for safety

## 2018-01-15 ENCOUNTER — Ambulatory Visit: Payer: Medicare Other | Attending: Physical Medicine & Rehabilitation | Admitting: Physical Therapy

## 2018-01-15 DIAGNOSIS — R5383 Other fatigue: Secondary | ICD-10-CM | POA: Diagnosis present

## 2018-01-15 DIAGNOSIS — R2681 Unsteadiness on feet: Secondary | ICD-10-CM | POA: Diagnosis present

## 2018-01-15 DIAGNOSIS — S88111S Complete traumatic amputation at level between knee and ankle, right lower leg, sequela: Secondary | ICD-10-CM

## 2018-01-15 DIAGNOSIS — R2689 Other abnormalities of gait and mobility: Secondary | ICD-10-CM | POA: Insufficient documentation

## 2018-01-15 DIAGNOSIS — M6281 Muscle weakness (generalized): Secondary | ICD-10-CM | POA: Insufficient documentation

## 2018-01-15 NOTE — Therapy (Signed)
[place] MAIN [facility] SERVICES [address] [place], [place], [address] Phone: [phone]   Fax:  [phone]  Physical Therapy Treatment  Patient Details  Name: Trevor [name] MRN: [id number] Date of Birth: 1955-04-28 Referring Provider (PT): Jamse Arn, DR   Encounter Date: 01/15/2018  PT End of Session - 01/15/18 1211    Visit Number  28    Number of Visits  43    Date for PT Re-Evaluation  02/05/18    Authorization Type   PN 8/10 start 11/14/17, goals updated 12/11/17    PT Start Time  1106    PT Stop Time  1200    PT Time Calculation (min)  54 min    Equipment Utilized During Treatment  Gait belt;Other (comment)   R prosthesis   Activity Tolerance  Patient tolerated treatment well    Behavior During Therapy  WFL for tasks assessed/performed       Past Medical History:  Diagnosis Date  . Diabetes mellitus without complication ([place])   . Heart murmur   . Hyperlipidemia   . Hypertension   . Sleep apnea     Past Surgical History:  Procedure Laterality Date  . AMPUTATION Right 03/31/2017   Procedure: RIGHT FOOT 1ST AND 2ND RAY AMPUTATION;  Surgeon: Newt Minion, MD;  Location: [place];  Service: Orthopedics;  Laterality: Right;  . AMPUTATION Right 04/01/2017   Procedure: AMPUTATION BELOW KNEE;  Surgeon: Newt Minion, MD;  Location: [place];  Service: Orthopedics;  Laterality: Right;  . COLONOSCOPY WITH PROPOFOL N/A 10/02/2017   Procedure: COLONOSCOPY WITH PROPOFOL;  Surgeon: [name], MD;  Location: [facility] ENDOSCOPY;  Service: Gastroenterology;  Laterality: N/A;  . CORNEAL TRANSPLANT      There were no vitals filed for this visit.  Subjective Assessment - 01/15/18 1236    Subjective  Patient reports that he has some increased pain at his R knee both at the patellar tendon and the joint itself. He has a bruise at the patellar tendon.     Pertinent History  63 year old right-handed male with history of diabetes mellitus,  hypertension, legally blind, and CKD presenting for BKA 04/01/17. Patient wears contacts and can see better now.  Patient had two surgeries, one was a couple toes then they decided to go below the knee. 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 Has ambulated with RW in hospital with PT. Patient has had home health therapy and was in inpatient rehab about a month. Patient reports he was mainly sitting down, with occasional walking, last home health therapy in May. Patient reports some standing up but no exercises. Patient lives alone in the house and is scared they might fall. Patient received prosthetic leg Tuesday of last week.    Limitations  Standing;Walking;House hold activities;Other (comment)    How long can you stand comfortably?  10-15 minutes    How long can you walk comfortably?  10-15 minutes    Patient Stated Goals  going up steps and driving car.     Currently in Pain?  Yes    Pain Score  3     Pain Location  Knee    Pain Orientation  Right;Anterior;Distal    Pain Descriptors / Indicators  Sore;Tender       TREATMENT  Donning of prosthesis   Therapeutic Exercise: Sit to stand without UE support from elevated  mat table 2 x 10; Seated hip flexion marches alternating LE 2 x 10; Seated clams with manual resistance 2 x 10; Seated adductor squeeze with manual resistance 2 x 10; Seated R LAQ with manual resistance 2 x 10; Seated R HS curls with manual resistance 2 x 10;   Gait Training Gait training in hall on level surface in open environment with moving obstacles; x400 feet, SPC faded, x200 feet no AD. Patient requires SBA/CGA. Patient had no loss of balance and maintained symmetrical strides throughout.   Neuromuscular Re-education  Toe taps to 6" step without UE support x 10 each; Airex balance NBOS with trunk rotation (bilaterally) and basketball tosses 2 x2 min. CGA. No LOB and limited sway.  Doffing  of prosthesis. PT Education - 01/15/18 1245    Education Details  exercise technique, gait mechanics    Person(s) Educated  Patient    Methods  Explanation;Demonstration;Verbal cues    Comprehension  Verbalized understanding;Returned demonstration       PT Short Term Goals - 12/11/17 1058      PT SHORT TERM GOAL #1   Title  Patient will be independent in home exercise program to improve strength/mobility for better functional independence with ADLs.    Baseline  HEP given, 12/11/17: Performing daily:     Time  2    Period  Weeks    Status  Achieved      PT SHORT TERM GOAL #2   Title  Patient will don/doff prosthesis independently to allow for increased mobility in home.     Baseline  8/14: requires PT to guide/direct through process 8/28: independent    Time  2    Period  Weeks    Status  Achieved      PT SHORT TERM GOAL #3   Title  Patient will ambulate 10 meters with least assistive device and prosthesis to improve mobility in home.     Baseline  8/14: not walking outside of // bars yet  8/28: 63 seconds with RW and CGA     Time  2    Period  Weeks    Status  Achieved      PT SHORT TERM GOAL #4   Title  Patient will perform STS with single UE support to decrease reliance upon UE's for stability     Baseline  patient requires BUE support 11/5: 1 UR support    Time  2    Period  Weeks    Status  Achieved        PT Long Term Goals - 12/11/17 1059      PT LONG TERM GOAL #1   Title  Patient will ambulate 60 ft with least assistive AD and prosthesis to improve mobility around home and increase independence.     Baseline  8/14: requires use of // bars 8/28: 30 ft with RW and CGA 9/17: 74 ft x2 with CGA and RW     Time  4    Period  Weeks    Status  Achieved      PT LONG TERM GOAL #2   Title  Patient (> 36 years old) will complete five times sit to stand test in < 15 seconds indicating an increased LE strength and improved balance.    Baseline  8/14: 61 seconds 8/28; 42  seconds 9/17: 24 seconds BUE support with LLE 10/10: 17 seconds with excessive BUE support  11/5: 18seconds BUE support; 12/11/17: 12/11/17: 18.5s with BUE support but  no AD    Time  4    Period  Weeks    Status  Partially Met    Target Date  02/05/18      PT LONG TERM GOAL #3   Title  Patient will reduce timed up and go to <11 seconds to reduce fall risk and demonstrate improved transfer/gait ability.    Baseline  8/14: unable to perform 8/28: 1 min 30 seconds 9/17: 38 seconds RW 10/10: 25 seconds with RW 11/5: 20 seconds with cane;, 12/11/17: 17.4s with spc    Time  4    Period  Weeks    Status  On-going    Target Date  02/05/18      PT LONG TERM GOAL #4   Title  Patient will increase lower extremity functional scale to >60/80 to demonstrate improved functional mobility and increased tolerance with ADLs.     Baseline  814: 29/80 8/28: 20/80 10/10: 20/80 11/5: 22/80; 12/11/17: 18/80    Time  4    Period  Weeks    Status  On-going    Target Date  02/05/18      PT LONG TERM GOAL #5   Title  Patient will increase BLE gross strength to 4+/5 as to improve functional strength for independent gait, increased standing tolerance and increased ADL ability.    Baseline  8/14: 4-/15 8/28: 4-/5  9/17: L 4-/5 R 4/5      Time  4    Period  Weeks    Status  Achieved      PT LONG TERM GOAL #6   Title  Patient will perform 10 MWT in >.5 m/s for improved mobility and safety with negotiating natural environment    Baseline  8/28: .16 m/s with RW  9/17: .53 m/s     Time  4    Period  Weeks    Status  Achieved      PT LONG TERM GOAL #7   Title  Patient will perform 10 MWT in >1.0 m/s for improved mobility and safety with negotiating natural environment    Baseline  9/17: .53 m/s 10/10: .63 m/s with RW 11/5: 0.74ms; 12/11/17: 15.33s = 0.65 m/s with spc    Time  4    Period  Weeks    Status  Partially Met      PT LONG TERM GOAL #8   Title  Patient will ambulate 300 ft with least assistive AD and  prosthesis to improve mobility around home and increase independence.     Baseline  9/17: 74 ft CGA with RW 10/10 ambulated 280 ft with RW, [facility] ambulate [id number] 10/31    Time  4    Period  Weeks    Status  Achieved      PT LONG TERM GOAL  #9   TITLE  Patient will ascend/descend 4 stairs with rail assist independently without loss of balance to improve ability to get in/out of home.     Baseline  9/17: unable to negotiate stairs 10/10: ascend with step to pattern, BUE support, and CGA 11/5: patient demonstrates ability to perform CGA and BUE support    Time  4    Period  Weeks    Status  Achieved      PT LONG TERM GOAL  #10   TITLE  Patient will ascend/descend 4 stairs with rail assist independently with reciprocating pattern without loss of balance to improve ability to get  in/out of home.     Baseline  11/5: step to pattern BUE support CGA; 12/11/17: BUE heavy support and reciprocal pattern;    Time  4    Period  Weeks    Status  Achieved      PT LONG TERM GOAL  #11   TITLE  Patient will ambulate >109f during 666m with LRAD to demonstrate improved community ambulation and functional mobility     Baseline  11/5: will complete next session; 12/11/17    Time  4    Period  Weeks    Status  Deferred    Target Date  02/05/18            Plan - 01/15/18 1236    Clinical Impression Statement  Patient presents to clinic with increased R knee pain, but is amenable to therapy. Patient demonstrated good ability to maintain symmetrical stride length while ambulating in an open environment without an AD; however he continues to have some unsteadiness when turning, but no LOB. Patient will continue to benefit from skilled therapeutic intervention to address deficits in strength, mobility, and activity tolerance in order to improve overall QOL.    Rehab Potential  Fair    Clinical Impairments Affecting Rehab Potential  (+) previous independence, motivation to return to walking  (-) lives alone, hx of diabetes, limited vision     PT Frequency  2x / week    PT Duration  8 weeks    PT Treatment/Interventions  ADLs/Self Care Home Management;Cryotherapy;Electrical Stimulation;Ultrasound;Traction;Moist Heat;DME Instruction;Gait training;Stair training;Functional mobility training;Therapeutic activities;Therapeutic exercise;Patient/family education;Neuromuscular re-education;Balance training;Prosthetic Training;Wheelchair mobility training;Manual techniques;Manual lymph drainage;Compression bandaging;Taping;Energy conservation;Passive range of motion    PT Next Visit Plan  outcome measures and goals, gait training, strengthening, balance    PT Home Exercise Plan  updated, see [id number] on medbridge    Consulted and Agree with Plan of Care  Patient       Patient will benefit from skilled therapeutic intervention in order to improve the following deficits and impairments:  Abnormal gait, Decreased activity tolerance, Decreased balance, Decreased knowledge of precautions, Decreased endurance, Decreased knowledge of use of DME, Decreased mobility, Decreased range of motion, Difficulty walking, Decreased strength, Increased edema, Impaired flexibility, Impaired perceived functional ability, Prosthetic Dependency, Postural dysfunction, Improper body mechanics, Pain  Visit Diagnosis: Muscle weakness (generalized)  Unsteadiness on feet  Other abnormalities of gait and mobility  Unilateral complete BKA, right, sequela (HCGirard Lethargy     Problem List Patient Active Problem List   Diagnosis Date Noted  . Abnormality of gait 10/12/2017  . Poorly controlled type 2 diabetes mellitus with peripheral neuropathy (HCBenitez  . Flatulence   . Hypomagnesemia   . Unilateral complete BKA, right, sequela (HCHennessey  . Benign essential HTN   . Hypoalbuminemia due to protein-calorie malnutrition (HCDillon  . S/P below knee amputation, right (HCBrooten04/03/2017  . Acute blood loss anemia   . Other  encephalopathy 04/08/2017  . Encephalopathy 04/08/2017  . Altered mental status 04/08/2017  . Labile blood pressure   . Labile blood glucose   . Drug induced constipation   . Stage 3 chronic kidney disease (HCMarion  . Bacteremia   . S/P unilateral BKA (below knee amputation), right (HCRound Hill Village03/26/2019  . PAD (peripheral artery disease) (HCRiverbend  . Type 2 diabetes mellitus with right diabetic foot ulcer (HCWeakley  . Post-operative pain   . Legally blind   . Upper GI bleed   . Streptococcal bacteremia 04/01/2017  .  Hypokalemia 03/30/2017  . Uncontrolled type 2 diabetes mellitus with hyperglycemia, with long-term current use of insulin ([place]) 03/30/2017  . Type 2 diabetes mellitus with peripheral neuropathy ([place]) 03/30/2017  . AKI (acute kidney injury) ([place]) 03/30/2017  . CKD stage 3 due to type 2 diabetes mellitus ([name]) 03/30/2017  . Sepsis ([place]) 03/30/2017  . Heart murmur 11/22/2016  . Hyperlipidemia associated with type 2 diabetes mellitus ([place]) 11/22/2016  . Obstructive sleep apnea syndrome 11/22/2016  . Essential hypertension 12/17/2012   Myles Gip PT, DPT [phone] 01/15/2018, 12:46 PM  [place] MAIN [facility] SERVICES [address] [place], [place], [address] Phone: [phone]   Fax:  [phone]  Name: Trevor [name] MRN: [id number] Date of Birth: [birth year]

## 2018-01-17 ENCOUNTER — Ambulatory Visit: Payer: Medicare Other

## 2018-01-17 VITALS — BP 134/68 | HR 59

## 2018-01-17 DIAGNOSIS — M6281 Muscle weakness (generalized): Secondary | ICD-10-CM

## 2018-01-17 DIAGNOSIS — R2681 Unsteadiness on feet: Secondary | ICD-10-CM

## 2018-01-17 NOTE — Therapy (Signed)
[place] MAIN [facility] SERVICES [address] [place], [place], [address] Phone: [phone]   Fax:  [phone]  Physical Therapy Treatment  Patient Details  Name: Trevor [name] MRN: [id number] Date of Birth: Apr 08, 1955 Referring Provider (PT): Jamse Arn, DR   Encounter Date: 01/17/2018  PT End of Session - 01/17/18 1046    Visit Number  29    Number of Visits  43    Date for PT Re-Evaluation  02/05/18    Authorization Type   PN 9/10 start 11/14/17, goals updated 12/11/17    PT Start Time  1032    PT Stop Time  1115    PT Time Calculation (min)  43 min    Equipment Utilized During Treatment  Gait belt;Other (comment)   R prosthesis   Activity Tolerance  Patient tolerated treatment well    Behavior During Therapy  WFL for tasks assessed/performed       Past Medical History:  Diagnosis Date  . Diabetes mellitus without complication ([place])   . Heart murmur   . Hyperlipidemia   . Hypertension   . Sleep apnea     Past Surgical History:  Procedure Laterality Date  . AMPUTATION Right 03/31/2017   Procedure: RIGHT FOOT 1ST AND 2ND RAY AMPUTATION;  Surgeon: Newt Minion, MD;  Location: [place];  Service: Orthopedics;  Laterality: Right;  . AMPUTATION Right 04/01/2017   Procedure: AMPUTATION BELOW KNEE;  Surgeon: Newt Minion, MD;  Location: [place];  Service: Orthopedics;  Laterality: Right;  . COLONOSCOPY WITH PROPOFOL N/A 10/02/2017   Procedure: COLONOSCOPY WITH PROPOFOL;  Surgeon: [name], MD;  Location: [facility] ENDOSCOPY;  Service: Gastroenterology;  Laterality: N/A;  . CORNEAL TRANSPLANT      Vitals:   01/17/18 1033  BP: 134/68  Pulse: (!) 59  SpO2: 97%    Subjective Assessment - 01/17/18 1042    Subjective  Patient reports no pain upon arrival but continues to state that he has had some increased pain at his R knee both at the patellar tendon. He has a bruise at the patellar tendon and he saw Dr. Posey Pronto and Dr. Sharol Given who  were both pleased with his progress. He showed them his bruise and he was advised to contact his prosthetist to see if his prosthetic needs to be adjusted.     Pertinent History  63 year old right-handed male with history of diabetes mellitus, hypertension, legally blind, and CKD presenting for BKA 04/01/17. Patient wears contacts and can see better now.  Patient had two surgeries, one was a couple toes then they decided to go below the knee. 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 Has ambulated with RW in hospital with PT. Patient has had home health therapy and was in inpatient rehab about a month. Patient reports he was mainly sitting down, with occasional walking, last home health therapy in May. Patient reports some standing up but no exercises. Patient lives alone in the house and is scared they might fall. Patient received prosthetic leg Tuesday of last week.    Limitations  Standing;Walking;House hold activities;Other (comment)    How long can you stand comfortably?  10-15 minutes    How long can you walk comfortably?  10-15 minutes    Currently in Pain?  No/denies        TREATMENT   Donning of prosthesis   Therapeutic Exercise:  NuStep HIIT L6 x 30s, L3 x 60s for 5 minutes total for warm-up during history, fatigue monitored; Quantum R single leg press 75# x 20, 90# x 20, 105# x 13, pt requires assist to initiate third set Sit to stand without UE support from regular height chair with 2 Airex pads on seat 2 x 10; Seated hip flexion marches alternating LE 2 x 10; Seated clams with manual resistance 2 x 10; Seated adductor squeeze with manual resistance 2 x 10; Toe taps to 6" step without UE support x 10 each;   Gait Training Gait training in hall x 600' with spc progressing to no assistive device for approximately 50-60% of the distance. Patient requires SBA/CGA. Pt performed horizontal and vertical head turns  on command as well as gait speed changes. Pt requires one seated rest break during distance.    Doffing of prosthesis.   Pt educated throughout session about proper posture and technique with exercises. Improved exercise technique, movement at target joints, use of target muscles after min to mod verbal, visual, tactile cues.    Pt continues to demonstrate excellent motivation during session. He is able to better vary his speed during ambulation as well as perform horizontal and vertical head turns. Pt encouraged to scheduled with his orthotist for possible adjustment of his prosthetic due to excessive pressure over his patellar tendon. He demonstrates improving strength with R single leg press today. He will need updated outcome measures and progress note at next visit. Patient will continue to benefit from skilled physical therapy to improve functional mobility, improve prosthetic use, and increase ambulation to increasehis functional independenceand improve overall QOL.                     PT Short Term Goals - 12/11/17 1058      PT SHORT TERM GOAL #1   Title  Patient will be independent in home exercise program to improve strength/mobility for better functional independence with ADLs.    Baseline  HEP given, 12/11/17: Performing daily:     Time  2    Period  Weeks    Status  Achieved      PT SHORT TERM GOAL #2   Title  Patient will don/doff prosthesis independently to allow for increased mobility in home.     Baseline  8/14: requires PT to guide/direct through process 8/28: independent    Time  2    Period  Weeks    Status  Achieved      PT SHORT TERM GOAL #3   Title  Patient will ambulate 10 meters with least assistive device and prosthesis to improve mobility in home.     Baseline  8/14: not walking outside of // bars yet  8/28: 63 seconds with RW and CGA     Time  2    Period  Weeks    Status  Achieved      PT SHORT TERM GOAL #4   Title  Patient will  perform STS with single UE support to decrease reliance upon UE's for stability     Baseline  patient requires BUE support 11/5: 1 UR support    Time  2    Period  Weeks    Status  Achieved        PT Long Term Goals - 12/11/17 1059      PT LONG TERM GOAL #1   Title  Patient will ambulate 47 ft with least assistive AD and prosthesis  to improve mobility around home and increase independence.     Baseline  8/14: requires use of // bars 8/28: 30 ft with RW and CGA 9/17: 74 ft x2 with CGA and RW     Time  4    Period  Weeks    Status  Achieved      PT LONG TERM GOAL #2   Title  Patient (> 9 years old) will complete five times sit to stand test in < 15 seconds indicating an increased LE strength and improved balance.    Baseline  8/14: 61 seconds 8/28; 42 seconds 9/17: 24 seconds BUE support with LLE 10/10: 17 seconds with excessive BUE support  11/5: 18seconds BUE support; 12/11/17: 12/11/17: 18.5s with BUE support but no AD    Time  4    Period  Weeks    Status  Partially Met    Target Date  02/05/18      PT LONG TERM GOAL #3   Title  Patient will reduce timed up and go to <11 seconds to reduce fall risk and demonstrate improved transfer/gait ability.    Baseline  8/14: unable to perform 8/28: 1 min 30 seconds 9/17: 38 seconds RW 10/10: 25 seconds with RW 11/5: 20 seconds with cane;, 12/11/17: 17.4s with spc    Time  4    Period  Weeks    Status  On-going    Target Date  02/05/18      PT LONG TERM GOAL #4   Title  Patient will increase lower extremity functional scale to >60/80 to demonstrate improved functional mobility and increased tolerance with ADLs.     Baseline  814: 29/80 8/28: 20/80 10/10: 20/80 11/5: 22/80; 12/11/17: 18/80    Time  4    Period  Weeks    Status  On-going    Target Date  02/05/18      PT LONG TERM GOAL #5   Title  Patient will increase BLE gross strength to 4+/5 as to improve functional strength for independent gait, increased standing tolerance and  increased ADL ability.    Baseline  8/14: 4-/15 8/28: 4-/5  9/17: L 4-/5 R 4/5      Time  4    Period  Weeks    Status  Achieved      PT LONG TERM GOAL #6   Title  Patient will perform 10 MWT in >.5 m/s for improved mobility and safety with negotiating natural environment    Baseline  8/28: .16 m/s with RW  9/17: .53 m/s     Time  4    Period  Weeks    Status  Achieved      PT LONG TERM GOAL #7   Title  Patient will perform 10 MWT in >1.0 m/s for improved mobility and safety with negotiating natural environment    Baseline  9/17: .53 m/s 10/10: .63 m/s with RW 11/5: 0.80ms; 12/11/17: 15.33s = 0.65 m/s with spc    Time  4    Period  Weeks    Status  Partially Met      PT LONG TERM GOAL #8   Title  Patient will ambulate 300 ft with least assistive AD and prosthesis to improve mobility around home and increase independence.     Baseline  9/17: 74 ft CGA with RW 10/10 ambulated 280 ft with RW, 70 ft with SUpmc Jameson11/5: ambulate 4616fon 10/31    Time  4  Period  Weeks    Status  Achieved      PT LONG TERM GOAL  #9   TITLE  Patient will ascend/descend 4 stairs with rail assist independently without loss of balance to improve ability to get in/out of home.     Baseline  9/17: unable to negotiate stairs 10/10: ascend with step to pattern, BUE support, and CGA 11/5: patient demonstrates ability to perform CGA and BUE support    Time  4    Period  Weeks    Status  Achieved      PT LONG TERM GOAL  #10   TITLE  Patient will ascend/descend 4 stairs with rail assist independently with reciprocating pattern without loss of balance to improve ability to get in/out of home.     Baseline  11/5: step to pattern BUE support CGA; 12/11/17: BUE heavy support and reciprocal pattern;    Time  4    Period  Weeks    Status  Achieved      PT LONG TERM GOAL  #11   TITLE  Patient will ambulate >10103f during 679m with LRAD to demonstrate improved community ambulation and functional mobility     Baseline   11/5: will complete next session; 12/11/17    Time  4    Period  Weeks    Status  Deferred    Target Date  02/05/18            Plan - 01/17/18 1046    Clinical Impression Statement  Pt continues to demonstrate excellent motivation during session. He is able to better vary his speed during ambulation as well as perform horizontal and vertical head turns. Pt encouraged to scheduled with his orthotist for possible adjustment of his prosthetic due to excessive pressure over his patellar tendon. He demonstrates improving strength with R single leg press today. He will need updated outcome measures and progress note at next visit. Patient will continue to benefit from skilled physical therapy to improve functional mobility, improve prosthetic use, and increase ambulation to increasehis functional independenceand improve overall QOL.    Rehab Potential  Fair    Clinical Impairments Affecting Rehab Potential  (+) previous independence, motivation to return to walking (-) lives alone, hx of diabetes, limited vision     PT Frequency  2x / week    PT Duration  8 weeks    PT Treatment/Interventions  ADLs/Self Care Home Management;Cryotherapy;Electrical Stimulation;Ultrasound;Traction;Moist Heat;DME Instruction;Gait training;Stair training;Functional mobility training;Therapeutic activities;Therapeutic exercise;Patient/family education;Neuromuscular re-education;Balance training;Prosthetic Training;Wheelchair mobility training;Manual techniques;Manual lymph drainage;Compression bandaging;Taping;Energy conservation;Passive range of motion    PT Next Visit Plan  outcome measures and goals, gait training, strengthening, balance    PT Home Exercise Plan  updated, see [id number] on medbridge    Consulted and Agree with Plan of Care  Patient       Patient will benefit from skilled therapeutic intervention in order to improve the following deficits and impairments:  Abnormal gait, Decreased activity tolerance,  Decreased balance, Decreased knowledge of precautions, Decreased endurance, Decreased knowledge of use of DME, Decreased mobility, Decreased range of motion, Difficulty walking, Decreased strength, Increased edema, Impaired flexibility, Impaired perceived functional ability, Prosthetic Dependency, Postural dysfunction, Improper body mechanics, Pain  Visit Diagnosis: Muscle weakness (generalized)  Unsteadiness on feet     Problem List Patient Active Problem List   Diagnosis Date Noted  . Abnormality of gait 10/12/2017  . Poorly controlled type 2 diabetes mellitus with peripheral neuropathy ([place]  . Flatulence   .  Hypomagnesemia   . Unilateral complete BKA, right, sequela ([name])   . Benign essential HTN   . Hypoalbuminemia due to protein-calorie malnutrition ([place])   . S/P below knee amputation, right ([place]) 04/12/2017  . Acute blood loss anemia   . Other encephalopathy 04/08/2017  . Encephalopathy 04/08/2017  . Altered mental status 04/08/2017  . Labile blood pressure   . Labile blood glucose   . Drug induced constipation   . Stage 3 chronic kidney disease ([place])   . Bacteremia   . S/P unilateral BKA (below knee amputation), right ([place]) 04/04/2017  . PAD (peripheral artery disease) ([place])   . Type 2 diabetes mellitus with right diabetic foot ulcer ([place])   . Post-operative pain   . Legally blind   . Upper GI bleed   . Streptococcal bacteremia 04/01/2017  . Hypokalemia 03/30/2017  . Uncontrolled type 2 diabetes mellitus with hyperglycemia, with long-term current use of insulin ([place]) 03/30/2017  . Type 2 diabetes mellitus with peripheral neuropathy ([place]) 03/30/2017  . AKI (acute kidney injury) ([place]) 03/30/2017  . CKD stage 3 due to type 2 diabetes mellitus ([place]) 03/30/2017  . Sepsis ([place]) 03/30/2017  . Heart murmur 11/22/2016  . Hyperlipidemia associated with type 2 diabetes mellitus ([place]) 11/22/2016  . Obstructive sleep apnea syndrome 11/22/2016  . Essential hypertension  12/17/2012   Phillips Grout PT, DPT, GCS  Huprich,Jason 01/17/2018, 3:01 PM  [place] MAIN [facility] SERVICES [address] [place], [place], [address] Phone: [phone]   Fax:  [phone]  Name: Trevor [name] MRN: [id number] Date of Birth: Mar 11, 1955

## 2018-01-21 ENCOUNTER — Encounter (INDEPENDENT_AMBULATORY_CARE_PROVIDER_SITE_OTHER): Payer: Self-pay | Admitting: Orthopedic Surgery

## 2018-01-21 NOTE — Progress Notes (Signed)
Office Visit Note   Patient: Trevor [name]           Date of Birth: 01-04-1956           MRN: [id number] Visit Date: 01/11/2018              Requested by: [name], MD [place], [place] [address] PCP: [name], MD  Chief Complaint  Patient presents with  . Right Leg - Follow-up    BKA      HPI: Patient is a 63 year old gentleman right transtibial amputee who is working with [employer].  He states he feels like the prosthesis is rubbing at the knee with any activities.  He states he did have his socket modified with pads placed medially and laterally but he states he still has pain he is currently wearing a 3 ply sock.  Assessment & Plan: Visit Diagnoses:  1. Acquired absence of right leg below knee (HCC)     Plan: Recommend patient increase to 5 ply sock he is definitely subsiding into his socket.  Follow-Up Instructions: Return in about 4 weeks (around 02/08/2018).   Ortho Exam  Patient is alert, oriented, no adenopathy, well-dressed, normal affect, normal respiratory effort. Examination patient has bruising beneath the patella consistent with subsidence into the socket there is no end bearing ulcers.  There is no callus no cellulitis distally.  Imaging: No results found. No images are attached to the encounter.  Labs: Lab Results  Component Value Date   HGBA1C 6.4 (A) 06/26/2017   HGBA1C 12.5 03/29/2017   HGBA1C 10.2 11/22/2016   REPTSTATUS 04/14/2017 FINAL 04/09/2017   GRAMSTAIN  03/31/2017    ABUNDANT WBC PRESENT, PREDOMINANTLY PMN ABUNDANT GRAM POSITIVE COCCI MODERATE GRAM NEGATIVE RODS Performed at [facility], [place] [address] [place], [place] [address]    CULT  04/09/2017    NO GROWTH 5 DAYS Performed at [place] [address], [place], [place] [address]    LABORGA STREPTOCOCCUS ANGINOSIS 03/31/2017     Lab Results  Component Value Date   ALBUMIN 2.3 (L) 04/13/2017   ALBUMIN 2.4 (L) 04/11/2017   ALBUMIN 2.1 (L) 04/05/2017    Body mass index is 34.69 kg/m.  Orders:  No orders of the defined types were placed in this encounter.  No orders of the defined types were placed in this encounter.    Procedures: No procedures performed  Clinical Data: No additional findings.  ROS:  All other systems negative, except as noted in the HPI. Review of Systems  Objective: Vital Signs: Ht 6\' 4"  (1.93 m)   Wt 285 lb (129.3 kg)   BMI 34.69 kg/m   Specialty Comments:  No specialty comments available.  PMFS History: Patient Active Problem List   Diagnosis Date Noted  . Abnormality of gait 10/12/2017  . Poorly controlled type 2 diabetes mellitus with peripheral neuropathy ([place])   . Flatulence   . Hypomagnesemia   . Unilateral complete BKA, right, sequela ([place])   . Benign essential HTN   . Hypoalbuminemia due to protein-calorie malnutrition ([place])   . S/P below knee amputation, right ([place]) 04/12/2017  . Acute blood loss anemia   . Other encephalopathy 04/08/2017  . Encephalopathy 04/08/2017  . Altered mental status 04/08/2017  . Labile blood pressure   . Labile blood glucose   . Drug induced constipation   . Stage 3 chronic kidney disease ([place])   . Bacteremia   .  S/P unilateral BKA (below knee amputation), right ([place]) 04/04/2017  . PAD (peripheral artery disease) ([place])   . Type 2 diabetes mellitus with right diabetic foot ulcer ([place])   . Post-operative pain   . Legally blind   . Upper GI bleed   . Streptococcal bacteremia 04/01/2017  . Hypokalemia 03/30/2017  . Uncontrolled type 2 diabetes mellitus with hyperglycemia, with long-term current use of insulin ([place]) 03/30/2017  . Type 2 diabetes mellitus with peripheral neuropathy ([place]) 03/30/2017  . AKI (acute kidney injury) ([place]) 03/30/2017  . CKD stage 3 due to type 2 diabetes mellitus ([place]) 03/30/2017  . Sepsis ([place]) 03/30/2017  . Heart murmur 11/22/2016  . Hyperlipidemia associated with type 2 diabetes mellitus  ([place]) 11/22/2016  . Obstructive sleep apnea syndrome 11/22/2016  . Essential hypertension 12/17/2012   Past Medical History:  Diagnosis Date  . Diabetes mellitus without complication ([place])   . Heart murmur   . Hyperlipidemia   . Hypertension   . Sleep apnea     History reviewed. No pertinent family history.  Past Surgical History:  Procedure Laterality Date  . AMPUTATION Right 03/31/2017   Procedure: RIGHT FOOT 1ST AND 2ND RAY AMPUTATION;  Surgeon: Newt Minion, MD;  Location: [place];  Service: Orthopedics;  Laterality: Right;  . AMPUTATION Right 04/01/2017   Procedure: AMPUTATION BELOW KNEE;  Surgeon: Newt Minion, MD;  Location: [place];  Service: Orthopedics;  Laterality: Right;  . COLONOSCOPY WITH PROPOFOL N/A 10/02/2017   Procedure: COLONOSCOPY WITH PROPOFOL;  Surgeon: [name], MD;  Location: [facility] ENDOSCOPY;  Service: Gastroenterology;  Laterality: N/A;  . CORNEAL TRANSPLANT     Social History   Occupational History  . Not on file  Tobacco Use  . Smoking status: Never Smoker  . Smokeless tobacco: Never Used  Substance and Sexual Activity  . Alcohol use: No  . Drug use: Never  . Sexual activity: Not on file

## 2018-01-22 ENCOUNTER — Ambulatory Visit: Payer: Medicare Other

## 2018-01-22 VITALS — BP 130/61 | HR 59

## 2018-01-22 DIAGNOSIS — R2681 Unsteadiness on feet: Secondary | ICD-10-CM

## 2018-01-22 DIAGNOSIS — R2689 Other abnormalities of gait and mobility: Secondary | ICD-10-CM

## 2018-01-22 DIAGNOSIS — M6281 Muscle weakness (generalized): Secondary | ICD-10-CM | POA: Diagnosis not present

## 2018-01-22 NOTE — Therapy (Signed)
[place] MAIN [facility] SERVICES [address] [place], [place], [address] Phone: [phone]   Fax:  [phone]  Physical Therapy Progress Note   Dates of reporting period  12/13/17   to   01/22/18  Patient Details  Name: Trevor [name] MRN: [id number] Date of Birth: [birth year] Referring Provider (PT): Jamse Arn, DR   Encounter Date: 01/22/2018  PT End of Session - 01/22/18 1046    Visit Number  30    Number of Visits  43    Date for PT Re-Evaluation  02/05/18    Authorization Type  Eval 08/23/17; goals updated 01/22/18    PT Start Time  1035    PT Stop Time  1115    PT Time Calculation (min)  40 min    Equipment Utilized During Treatment  Gait belt;Other (comment)   R prosthesis   Activity Tolerance  Patient tolerated treatment well    Behavior During Therapy  WFL for tasks assessed/performed       Past Medical History:  Diagnosis Date  . Diabetes mellitus without complication ([place])   . Heart murmur   . Hyperlipidemia   . Hypertension   . Sleep apnea     Past Surgical History:  Procedure Laterality Date  . AMPUTATION Right 03/31/2017   Procedure: RIGHT FOOT 1ST AND 2ND RAY AMPUTATION;  Surgeon: Newt Minion, MD;  Location: [place];  Service: Orthopedics;  Laterality: Right;  . AMPUTATION Right 04/01/2017   Procedure: AMPUTATION BELOW KNEE;  Surgeon: Newt Minion, MD;  Location: [place];  Service: Orthopedics;  Laterality: Right;  . COLONOSCOPY WITH PROPOFOL N/A 10/02/2017   Procedure: COLONOSCOPY WITH PROPOFOL;  Surgeon: [name], MD;  Location: [facility] ENDOSCOPY;  Service: Gastroenterology;  Laterality: N/A;  . CORNEAL TRANSPLANT      Vitals:   01/22/18 1039  BP: 130/61  Pulse: (!) 59  SpO2: 99%    Subjective Assessment - 01/22/18 1043    Subjective  Patient reports no pain upon arrival but continues to states that he has continued to have some increased pain at his R knee on his patellar tendon after walking. He  has an appointment to see his prosthetist on 01/30/18. He tried to get his appointment moved up sooner but there was no availability. He has not had any worsening of his bruising. No specific questions or concerns.     Pertinent History  63 year old right-handed male with history of diabetes mellitus, hypertension, legally blind, and CKD presenting for BKA 04/01/17. Patient wears contacts and can see better now.  Patient had two surgeries, one was a couple toes then they decided to go below the knee. 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 Has ambulated with RW in hospital with PT. Patient has had home health therapy and was in inpatient rehab about a month. Patient reports he was mainly sitting down, with occasional walking, last home health therapy in May. Patient reports some standing up but no exercises. Patient lives alone in the house and is scared they might fall. Patient received prosthetic leg Tuesday of last week.    Limitations  Standing;Walking;House hold activities;Other (comment)    How long can you stand comfortably?  10-15 minutes    How long can you walk comfortably?  10-15 minutes    Currently in Pain?  No/denies   No pain at rest  TREATMENT  Donning of prosthesis   Therapeutic Exercise: NuStep L0-6 x 5:30 minutes for warm-up and CV challenge secondary to R knee pain during ambulation, fatigue monitored and resistance adjusted throughout; Updated goals with pt including 5TSTS (18.5s), TUG (17.4s), and 36mgait speed (self-selected: 14.4s = 0.69 m/s, fastest: 11.8s = 0.85 m/s), and 6MWT: 400' in 3 minutes but stopped secondary to R knee pain;  Pt completed LEFS: 27/80 (unbilled); Updated goals with patient and discussed plan of care; Seated marches x 10 bilateral; Seated clams with manual resistance x 10; Seated adductor squeeze with manual resistance x 10;   Pt educated throughout session  about proper posture and technique with exercises. Improved exercise technique, movement at target joints, use of target muscles after min to mod verbal, visual, tactile cues.   Pt demonstrating improvement in all of his outcome measures since they were last updated on 12/11/17. His 5TSTS decreased from 18.5s to 17.1s however he continues to require considerable assistance from BUE to stand. His TUG decreased from 17.4s to 14.8s and his 62mait speed improved from 0.65 m/s to 0.85 m/s. His LEFS increased to 27/80. Attempted 6MWT however pt had to stop secondary to increase in R knee pain over patellar tendon after 3 minutes. He was able to complete 400' in 3 minutes. Limited time at the end of session spent performing seated exercises. Patient encouraged to continue HEP and follow-up as scheduled. He will continue to benefit from skilled physical therapy to improve functional mobility, improve prosthetic use, and increase ambulation to increase LOF and improve overall QOL.                         PT Short Term Goals - 01/22/18 1047      PT SHORT TERM GOAL #1   Title  Patient will be independent in home exercise program to improve strength/mobility for better functional independence with ADLs.    Baseline  HEP given, 12/11/17: Performing daily:     Time  2    Period  Weeks    Status  Achieved      PT SHORT TERM GOAL #2   Title  Patient will don/doff prosthesis independently to allow for increased mobility in home.     Baseline  8/14: requires PT to guide/direct through process 8/28: independent    Time  2    Period  Weeks    Status  Achieved      PT SHORT TERM GOAL #3   Title  Patient will ambulate 10 meters with least assistive device and prosthesis to improve mobility in home.     Baseline  8/14: not walking outside of // bars yet  8/28: 62 seconds with RW and CGA     Time  2    Period  Weeks    Status  Achieved      PT SHORT TERM GOAL #4   Title  Patient will  perform STS with single UE support to decrease reliance upon UE's for stability     Baseline  patient requires BUE support 11/5: 1 UR support    Time  2    Period  Weeks    Status  Achieved        PT Long Term Goals - 01/22/18 1047      PT LONG TERM GOAL #1   Title  Patient will ambulate 60 ft with least assistive AD and prosthesis to improve mobility around home and increase  independence.     Baseline  8/14: requires use of // bars 8/28: 30 ft with RW and CGA 9/17: 74 ft x2 with CGA and RW     Time  4    Period  Weeks    Status  Achieved      PT LONG TERM GOAL #2   Title  Patient (> 59 years old) will complete five times sit to stand test in < 15 seconds indicating an increased LE strength and improved balance.    Baseline  8/14: 61 seconds 8/28; 42 seconds 9/17: 24 seconds BUE support with LLE 10/10: 17 seconds with excessive BUE support  11/5: 18seconds BUE support; 12/11/17: 12/11/17: 18.5s with BUE support but no AD, 01/22/18: 17.1s BUE support but no AD    Time  4    Period  Weeks    Status  Partially Met    Target Date  02/05/18      PT LONG TERM GOAL #3   Title  Patient will reduce timed up and go to <11 seconds to reduce fall risk and demonstrate improved transfer/gait ability.    Baseline  8/14: unable to perform 8/28: 1 min 30 seconds 9/17: 38 seconds RW 10/10: 25 seconds with RW 11/5: 20 seconds with cane;, 12/11/17: 17.4s with spc; 01/22/18: 14.8s with spc    Time  4    Period  Weeks    Status  On-going    Target Date  02/05/18      PT LONG TERM GOAL #4   Title  Patient will increase lower extremity functional scale to >60/80 to demonstrate improved functional mobility and increased tolerance with ADLs.     Baseline  814: 29/80 8/28: 20/80 10/10: 20/80 11/5: 22/80; 12/11/17: 18/80; 01/22/18: 27/80    Time  4    Period  Weeks    Status  On-going    Target Date  02/05/18      PT LONG TERM GOAL #5   Title  Patient will increase BLE gross strength to 4+/5 as to improve  functional strength for independent gait, increased standing tolerance and increased ADL ability.    Baseline  8/14: 4-/15 8/28: 4-/5  9/17: L 4-/5 R 4/5      Time  4    Period  Weeks    Status  Achieved      PT LONG TERM GOAL #6   Title  Patient will perform 10 MWT in >.5 m/s for improved mobility and safety with negotiating natural environment    Baseline  8/28: .16 m/s with RW  9/17: .53 m/s;     Time  4    Period  Weeks    Status  Achieved      PT LONG TERM GOAL #7   Title  Patient will perform 10 MWT in >1.0 m/s for improved mobility and safety with negotiating natural environment    Baseline  9/17: .53 m/s 10/10: .63 m/s with RW 11/5: 0.15ms; 12/11/17: 15.33s = 0.65 m/s with spc; 01/22/18: self-selected: 14.4s = 0.69 m/s, fastest: 11.8s = 0.85 m/s     Time  4    Period  Weeks    Status  Partially Met    Target Date  02/05/18      PT LONG TERM GOAL #8   Title  Patient will ambulate 300 ft with least assistive AD and prosthesis to improve mobility around home and increase independence.     Baseline  9/17: 74 ft CGA with  RW 10/10 ambulated 280 ft with RW, 70 ft with [facility] 11/5: ambulate 414f on 10/31    Time  4    Period  Weeks    Status  Achieved      PT LONG TERM GOAL  #9   TITLE  Patient will ascend/descend 4 stairs with rail assist independently without loss of balance to improve ability to get in/out of home.     Baseline  9/17: unable to negotiate stairs 10/10: ascend with step to pattern, BUE support, and CGA 11/5: patient demonstrates ability to perform CGA and BUE support    Time  4    Period  Weeks    Status  Achieved      PT LONG TERM GOAL  #10   TITLE  Patient will ascend/descend 4 stairs with rail assist independently with reciprocating pattern without loss of balance to improve ability to get in/out of home.     Baseline  11/5: step to pattern BUE support CGA; 12/11/17: BUE heavy support and reciprocal pattern;    Time  4    Period  Weeks    Status  Achieved       PT LONG TERM GOAL  #11   TITLE  Patient will ambulate >10039fduring 16m39mwith LRAD to demonstrate improved community ambulation and functional mobility     Baseline  11/5: will complete next session; 01/22/18: 400' in 3 minutes but had to stop secondary to pain in knee    Time  4    Period  Weeks    Status  Partially Met    Target Date  02/05/18            Plan - 01/22/18 1047    Clinical Impression Statement  Pt demonstrating improvement in all of his outcome measures since they were last updated on 12/11/17. His 5TSTS decreased from 18.5s to 17.1s however he continues to require considerable assistance from BUE to stand. His TUG decreased from 17.4s to 14.8s and his 20m71mt speed improved from 0.65 m/s to 0.85 m/s. His LEFS increased to 27/80. Attempted 6MWT however pt had to stop secondary to increase in R knee pain over patellar tendon after 3 minutes. He was able to complete 400' in 3 minutes. Limited time at the end of session spent performing seated exercises. Patient encouraged to continue HEP and follow-up as scheduled. He will continue to benefit from skilled physical therapy to improve functional mobility, improve prosthetic use, and increase ambulation to increase LOF and improve overall QOL.    Rehab Potential  Fair    Clinical Impairments Affecting Rehab Potential  (+) previous independence, motivation to return to walking (-) lives alone, hx of diabetes, limited vision     PT Frequency  2x / week    PT Duration  8 weeks    PT Treatment/Interventions  ADLs/Self Care Home Management;Cryotherapy;Electrical Stimulation;Ultrasound;Traction;Moist Heat;DME Instruction;Gait training;Stair training;Functional mobility training;Therapeutic activities;Therapeutic exercise;Patient/family education;Neuromuscular re-education;Balance training;Prosthetic Training;Wheelchair mobility training;Manual techniques;Manual lymph drainage;Compression bandaging;Taping;Energy conservation;Passive range  of motion    PT Next Visit Plan  gait training, strengthening, balance    PT Home Exercise Plan  see [id number], spc ambulation    Consulted and Agree with Plan of Care  Patient       Patient will benefit from skilled therapeutic intervention in order to improve the following deficits and impairments:  Abnormal gait, Decreased activity tolerance, Decreased balance, Decreased knowledge of precautions, Decreased endurance, Decreased knowledge of use of DME, Decreased mobility,  Decreased range of motion, Difficulty walking, Decreased strength, Increased edema, Impaired flexibility, Impaired perceived functional ability, Prosthetic Dependency, Postural dysfunction, Improper body mechanics, Pain  Visit Diagnosis: Muscle weakness (generalized)  Unsteadiness on feet  Other abnormalities of gait and mobility     Problem List Patient Active Problem List   Diagnosis Date Noted  . Abnormality of gait 10/12/2017  . Poorly controlled type 2 diabetes mellitus with peripheral neuropathy ([place])   . Flatulence   . Hypomagnesemia   . Unilateral complete BKA, right, sequela ([place])   . Benign essential HTN   . Hypoalbuminemia due to protein-calorie malnutrition ([place])   . S/P below knee amputation, right ([place]) 04/12/2017  . Acute blood loss anemia   . Other encephalopathy 04/08/2017  . Encephalopathy 04/08/2017  . Altered mental status 04/08/2017  . Labile blood pressure   . Labile blood glucose   . Drug induced constipation   . Stage 3 chronic kidney disease ([place])   . Bacteremia   . S/P unilateral BKA (below knee amputation), right ([place]) 04/04/2017  . PAD (peripheral artery disease) ([place])   . Type 2 diabetes mellitus with right diabetic foot ulcer ([place])   . Post-operative pain   . Legally blind   . Upper GI bleed   . Streptococcal bacteremia 04/01/2017  . Hypokalemia 03/30/2017  . Uncontrolled type 2 diabetes mellitus with hyperglycemia, with long-term current use of insulin ([place])  03/30/2017  . Type 2 diabetes mellitus with peripheral neuropathy ([place]) 03/30/2017  . AKI (acute kidney injury) ([place]) 03/30/2017  . CKD stage 3 due to type 2 diabetes mellitus ([place]) 03/30/2017  . Sepsis ([place]) 03/30/2017  . Heart murmur 11/22/2016  . Hyperlipidemia associated with type 2 diabetes mellitus ([place]) 11/22/2016  . Obstructive sleep apnea syndrome 11/22/2016  . Essential hypertension 12/17/2012   Phillips Grout PT, DPT, GCS  Domanic Matusek 01/22/2018, 1:41 PM  [place] MAIN [facility] SERVICES [address] [place], [place], [address] Phone: [phone]   Fax:  [phone]  Name: ENZO TREU MRN: [id number] Date of Birth: Jan 26, 1955

## 2018-01-24 ENCOUNTER — Ambulatory Visit: Payer: Medicare Other

## 2018-01-24 VITALS — BP 132/69 | HR 62

## 2018-01-24 DIAGNOSIS — M6281 Muscle weakness (generalized): Secondary | ICD-10-CM

## 2018-01-24 DIAGNOSIS — R2681 Unsteadiness on feet: Secondary | ICD-10-CM

## 2018-01-24 DIAGNOSIS — R2689 Other abnormalities of gait and mobility: Secondary | ICD-10-CM

## 2018-01-24 NOTE — Therapy (Signed)
[place] MAIN [facility] SERVICES [address] [place], [place], [address] Phone: [phone]   Fax:  [phone]  Physical Therapy Treatment  Patient Details  Name: Trevor [name] MRN: [id number] Date of Birth: 06-18-1955 Referring Provider (PT): Jamse Arn, DR   Encounter Date: 01/24/2018  PT End of Session - 01/25/18 0947    Visit Number  31    Number of Visits  43    Date for PT Re-Evaluation  02/05/18    Authorization Type  Eval 08/23/17; goals updated 01/22/18    PT Start Time  1030    PT Stop Time  1115    PT Time Calculation (min)  45 min    Equipment Utilized During Treatment  Gait belt;Other (comment)   R prosthesis   Activity Tolerance  Patient tolerated treatment well    Behavior During Therapy  WFL for tasks assessed/performed       Past Medical History:  Diagnosis Date  . Diabetes mellitus without complication ([place])   . Heart murmur   . Hyperlipidemia   . Hypertension   . Sleep apnea     Past Surgical History:  Procedure Laterality Date  . AMPUTATION Right 03/31/2017   Procedure: RIGHT FOOT 1ST AND 2ND RAY AMPUTATION;  Surgeon: Newt Minion, MD;  Location: [place];  Service: Orthopedics;  Laterality: Right;  . AMPUTATION Right 04/01/2017   Procedure: AMPUTATION BELOW KNEE;  Surgeon: Newt Minion, MD;  Location: [place];  Service: Orthopedics;  Laterality: Right;  . COLONOSCOPY WITH PROPOFOL N/A 10/02/2017   Procedure: COLONOSCOPY WITH PROPOFOL;  Surgeon: [name], MD;  Location: [facility] ENDOSCOPY;  Service: Gastroenterology;  Laterality: N/A;  . CORNEAL TRANSPLANT      Vitals:   01/24/18 1035  BP: 132/69  Pulse: 62  SpO2: 100%    Subjective Assessment - 01/25/18 0946    Subjective  Patient reports no pain upon arrival but continues to state that he has some increased pain at his R knee on his patellar tendon and over his knee cap after walking. No specific questions or concerns. He reports compliance with  HEP    Pertinent History  63 year old right-handed male with history of diabetes mellitus, hypertension, legally blind, and CKD presenting for BKA 04/01/17. Patient wears contacts and can see better now.  Patient had two surgeries, one was a couple toes then they decided to go below the knee. 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 Has ambulated with RW in hospital with PT. Patient has had home health therapy and was in inpatient rehab about a month. Patient reports he was mainly sitting down, with occasional walking, last home health therapy in May. Patient reports some standing up but no exercises. Patient lives alone in the house and is scared they might fall. Patient received prosthetic leg Tuesday of last week.    Limitations  Standing;Walking;House hold activities;Other (comment)    How long can you stand comfortably?  10-15 minutes    How long can you walk comfortably?  10-15 minutes    Currently in Pain?  No/denies          TREATMENT   Donning of prosthesis   Therapeutic Exercise: NuStep L2-6 with therapist adjusting resistance throughout for 5 minutes total for warm-up during history, fatigue monitored; Quantum R single leg press 90# x 20, 105# x 15, 120# x 5 pt requires  assist to initiate third set Sit to stand without UE support from regular height chair with 1 Airex pads on seat x 6 with partial 7th rep, x 7 with partial 8th rep during second set; Seated hip flexion marches alternating LE 2 x 10; Seated clams with manual resistance 2 x 10; Seated adductor squeeze with manual resistance 2 x 10; Seated R LAQ with manual resistance x 15; Toe taps to 6" step without UE support x 10 each; Extensive conversation regarding driving rehabilitation along with education/handouts for community resources and payers including the office of independent living services that is part of [employer].   Doffing of  prosthesis. Pt educated throughout session about proper posture and technique with exercises. Improved exercise technique, movement at target joints, use of target muscles after min to mod verbal, visual, tactile cues.    Pt continues to demonstrate excellent motivation during session. He is able to increase his resistance on the leg press today. He is able to complete all exercises as instructed with intermitted seated rest breaks. He continues to demonstrate difficulty with single leg balance on RLE. Patient will continue to benefit from skilled physical therapy to improve functional mobility, improve prosthetic use, and increase ambulation to increasehis functional independenceand improve overall QOL.                          PT Short Term Goals - 01/22/18 1047      PT SHORT TERM GOAL #1   Title  Patient will be independent in home exercise program to improve strength/mobility for better functional independence with ADLs.    Baseline  HEP given, 12/11/17: Performing daily:     Time  2    Period  Weeks    Status  Achieved      PT SHORT TERM GOAL #2   Title  Patient will don/doff prosthesis independently to allow for increased mobility in home.     Baseline  8/14: requires PT to guide/direct through process 8/28: independent    Time  2    Period  Weeks    Status  Achieved      PT SHORT TERM GOAL #3   Title  Patient will ambulate 10 meters with least assistive device and prosthesis to improve mobility in home.     Baseline  8/14: not walking outside of // bars yet  8/28: 63 seconds with RW and CGA     Time  2    Period  Weeks    Status  Achieved      PT SHORT TERM GOAL #4   Title  Patient will perform STS with single UE support to decrease reliance upon UE's for stability     Baseline  patient requires BUE support 11/5: 1 UR support    Time  2    Period  Weeks    Status  Achieved        PT Long Term Goals - 01/22/18 1047      PT LONG TERM GOAL #1    Title  Patient will ambulate 60 ft with least assistive AD and prosthesis to improve mobility around home and increase independence.     Baseline  8/14: requires use of // bars 8/28: 30 ft with RW and CGA 9/17: 74 ft x2 with CGA and RW     Time  4    Period  Weeks    Status  Achieved      PT LONG  TERM GOAL #2   Title  Patient (> 59 years old) will complete five times sit to stand test in < 15 seconds indicating an increased LE strength and improved balance.    Baseline  8/14: 61 seconds 8/28; 42 seconds 9/17: 24 seconds BUE support with LLE 10/10: 17 seconds with excessive BUE support  11/5: 18seconds BUE support; 12/11/17: 12/11/17: 18.5s with BUE support but no AD, 01/22/18: 17.1s BUE support but no AD    Time  4    Period  Weeks    Status  Partially Met    Target Date  02/05/18      PT LONG TERM GOAL #3   Title  Patient will reduce timed up and go to <11 seconds to reduce fall risk and demonstrate improved transfer/gait ability.    Baseline  8/14: unable to perform 8/28: 1 min 30 seconds 9/17: 38 seconds RW 10/10: 25 seconds with RW 11/5: 20 seconds with cane;, 12/11/17: 17.4s with spc; 01/22/18: 14.8s with spc    Time  4    Period  Weeks    Status  On-going    Target Date  02/05/18      PT LONG TERM GOAL #4   Title  Patient will increase lower extremity functional scale to >60/80 to demonstrate improved functional mobility and increased tolerance with ADLs.     Baseline  814: 29/80 8/28: 20/80 10/10: 20/80 11/5: 22/80; 12/11/17: 18/80; 01/22/18: 27/80    Time  4    Period  Weeks    Status  On-going    Target Date  02/05/18      PT LONG TERM GOAL #5   Title  Patient will increase BLE gross strength to 4+/5 as to improve functional strength for independent gait, increased standing tolerance and increased ADL ability.    Baseline  8/14: 4-/15 8/28: 4-/5  9/17: L 4-/5 R 4/5      Time  4    Period  Weeks    Status  Achieved      PT LONG TERM GOAL #6   Title  Patient will perform 10 MWT  in >.5 m/s for improved mobility and safety with negotiating natural environment    Baseline  8/28: .16 m/s with RW  9/17: .53 m/s;     Time  4    Period  Weeks    Status  Achieved      PT LONG TERM GOAL #7   Title  Patient will perform 10 MWT in >1.0 m/s for improved mobility and safety with negotiating natural environment    Baseline  9/17: .53 m/s 10/10: .63 m/s with RW 11/5: 0.74ms; 12/11/17: 15.33s = 0.65 m/s with spc; 01/22/18: self-selected: 14.4s = 0.69 m/s, fastest: 11.8s = 0.85 m/s     Time  4    Period  Weeks    Status  Partially Met    Target Date  02/05/18      PT LONG TERM GOAL #8   Title  Patient will ambulate 300 ft with least assistive AD and prosthesis to improve mobility around home and increase independence.     Baseline  9/17: 74 ft CGA with RW 10/10 ambulated 280 ft with RW, [facility]: ambulate [id number] 10/31    Time  4    Period  Weeks    Status  Achieved      PT LONG TERM GOAL  #9   TITLE  Patient will ascend/descend 4 stairs  with rail assist independently without loss of balance to improve ability to get in/out of home.     Baseline  9/17: unable to negotiate stairs 10/10: ascend with step to pattern, BUE support, and CGA 11/5: patient demonstrates ability to perform CGA and BUE support    Time  4    Period  Weeks    Status  Achieved      PT LONG TERM GOAL  #10   TITLE  Patient will ascend/descend 4 stairs with rail assist independently with reciprocating pattern without loss of balance to improve ability to get in/out of home.     Baseline  11/5: step to pattern BUE support CGA; 12/11/17: BUE heavy support and reciprocal pattern;    Time  4    Period  Weeks    Status  Achieved      PT LONG TERM GOAL  #11   TITLE  Patient will ambulate >1070f during 680m with LRAD to demonstrate improved community ambulation and functional mobility     Baseline  11/5: will complete next session; 01/22/18: 400' in 3 minutes but had to stop secondary to pain in knee     Time  4    Period  Weeks    Status  Partially Met    Target Date  02/05/18            Plan - 01/25/18 0947    Clinical Impression Statement  Pt continues to demonstrate excellent motivation during session. He is able to increase his resistance on the leg press today. He is able to complete all exercises as instructed with intermitted seated rest breaks. He continues to demonstrate difficulty with single leg balance on RLE. Patient will continue to benefit from skilled physical therapy to improve functional mobility, improve prosthetic use, and increase ambulation to increasehis functional independenceand improve overall QOL.    Rehab Potential  Fair    Clinical Impairments Affecting Rehab Potential  (+) previous independence, motivation to return to walking (-) lives alone, hx of diabetes, limited vision     PT Frequency  2x / week    PT Duration  8 weeks    PT Treatment/Interventions  ADLs/Self Care Home Management;Cryotherapy;Electrical Stimulation;Ultrasound;Traction;Moist Heat;DME Instruction;Gait training;Stair training;Functional mobility training;Therapeutic activities;Therapeutic exercise;Patient/family education;Neuromuscular re-education;Balance training;Prosthetic Training;Wheelchair mobility training;Manual techniques;Manual lymph drainage;Compression bandaging;Taping;Energy conservation;Passive range of motion    PT Next Visit Plan  gait training, strengthening, balance    PT Home Exercise Plan  see [id number] medbridge, spc ambulation    Consulted and Agree with Plan of Care  Patient       Patient will benefit from skilled therapeutic intervention in order to improve the following deficits and impairments:  Abnormal gait, Decreased activity tolerance, Decreased balance, Decreased knowledge of precautions, Decreased endurance, Decreased knowledge of use of DME, Decreased mobility, Decreased range of motion, Difficulty walking, Decreased strength, Increased edema, Impaired  flexibility, Impaired perceived functional ability, Prosthetic Dependency, Postural dysfunction, Improper body mechanics, Pain  Visit Diagnosis: Muscle weakness (generalized)  Unsteadiness on feet  Other abnormalities of gait and mobility     Problem List Patient Active Problem List   Diagnosis Date Noted  . Abnormality of gait 10/12/2017  . Poorly controlled type 2 diabetes mellitus with peripheral neuropathy ([place]  . Flatulence   . Hypomagnesemia   . Unilateral complete BKA, right, sequela ([place]  . Benign essential HTN   . Hypoalbuminemia due to protein-calorie malnutrition ([place]  . S/P below knee amputation, right (HCCeres04/03/2017  .  Acute blood loss anemia   . Other encephalopathy 04/08/2017  . Encephalopathy 04/08/2017  . Altered mental status 04/08/2017  . Labile blood pressure   . Labile blood glucose   . Drug induced constipation   . Stage 3 chronic kidney disease ([place])   . Bacteremia   . S/P unilateral BKA (below knee amputation), right ([place]) 04/04/2017  . PAD (peripheral artery disease) ([place])   . Type 2 diabetes mellitus with right diabetic foot ulcer ([place])   . Post-operative pain   . Legally blind   . Upper GI bleed   . Streptococcal bacteremia 04/01/2017  . Hypokalemia 03/30/2017  . Uncontrolled type 2 diabetes mellitus with hyperglycemia, with long-term current use of insulin ([place]) 03/30/2017  . Type 2 diabetes mellitus with peripheral neuropathy ([place]) 03/30/2017  . AKI (acute kidney injury) ([place]) 03/30/2017  . CKD stage 3 due to type 2 diabetes mellitus ([place]) 03/30/2017  . Sepsis ([place]) 03/30/2017  . Heart murmur 11/22/2016  . Hyperlipidemia associated with type 2 diabetes mellitus ([place]) 11/22/2016  . Obstructive sleep apnea syndrome 11/22/2016  . Essential hypertension 12/17/2012   Phillips Grout PT, DPT, GCS  Huprich,Jason 01/25/2018, 10:14 AM  [place] MAIN [facility] SERVICES [address] [place],  [place], [address] Phone: [phone]   Fax:  [phone]  Name: Trevor [name] MRN: [id number] Date of Birth: 1955/04/01

## 2018-01-31 ENCOUNTER — Ambulatory Visit: Payer: Medicare Other

## 2018-01-31 DIAGNOSIS — S88111S Complete traumatic amputation at level between knee and ankle, right lower leg, sequela: Secondary | ICD-10-CM

## 2018-01-31 DIAGNOSIS — M6281 Muscle weakness (generalized): Secondary | ICD-10-CM

## 2018-01-31 DIAGNOSIS — R2689 Other abnormalities of gait and mobility: Secondary | ICD-10-CM

## 2018-01-31 DIAGNOSIS — R2681 Unsteadiness on feet: Secondary | ICD-10-CM

## 2018-01-31 NOTE — Therapy (Signed)
[place] MAIN [facility] SERVICES [address] [place], [place], [address] Phone: [phone]   Fax:  [phone]  Physical Therapy Treatment  Patient Details  Name: Trevor [name] MRN: [id number] Date of Birth: 08-07-1955 Referring Provider (PT): Jamse Arn, DR   Encounter Date: 01/31/2018  PT End of Session - 01/31/18 1224    Visit Number  32    Number of Visits  43    Date for PT Re-Evaluation  02/05/18    Authorization Type  Eval 08/23/17; goals updated 01/22/18    PT Start Time  1105    PT Stop Time  1144    PT Time Calculation (min)  39 min    Equipment Utilized During Treatment  Gait belt;Other (comment)   R prosthesis   Activity Tolerance  Patient tolerated treatment well    Behavior During Therapy  WFL for tasks assessed/performed       Past Medical History:  Diagnosis Date  . Diabetes mellitus without complication ([place])   . Heart murmur   . Hyperlipidemia   . Hypertension   . Sleep apnea     Past Surgical History:  Procedure Laterality Date  . AMPUTATION Right 03/31/2017   Procedure: RIGHT FOOT 1ST AND 2ND RAY AMPUTATION;  Surgeon: Newt Minion, MD;  Location: [place];  Service: Orthopedics;  Laterality: Right;  . AMPUTATION Right 04/01/2017   Procedure: AMPUTATION BELOW KNEE;  Surgeon: Newt Minion, MD;  Location: [place];  Service: Orthopedics;  Laterality: Right;  . COLONOSCOPY WITH PROPOFOL N/A 10/02/2017   Procedure: COLONOSCOPY WITH PROPOFOL;  Surgeon: [name], MD;  Location: [facility] ENDOSCOPY;  Service: Gastroenterology;  Laterality: N/A;  . CORNEAL TRANSPLANT      There were no vitals filed for this visit.  Subjective Assessment - 01/31/18 1221    Subjective  Patient has no complaints of pain  at start of session. Stated he has a new adjustment for his prosthetic but has not had a chance to try it to see how he responds.     Pertinent History  63 year old right-handed male with history of diabetes mellitus,  hypertension, legally blind, and CKD presenting for BKA 04/01/17. Patient wears contacts and can see better now.  Patient had two surgeries, one was a couple toes then they decided to go below the knee. 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 Has ambulated with RW in hospital with PT. Patient has had home health therapy and was in inpatient rehab about a month. Patient reports he was mainly sitting down, with occasional walking, last home health therapy in May. Patient reports some standing up but no exercises. Patient lives alone in the house and is scared they might fall. Patient received prosthetic leg Tuesday of last week.    Limitations  Standing;Walking;House hold activities;Other (comment)    How long can you stand comfortably?  10-15 minutes    How long can you walk comfortably?  10-15 minutes    Patient Stated Goals  going up steps and driving car.     Currently in Pain?  No/denies      TREATMENT Donning of prosthesis     Therapeutic Exercise: Quantum R single leg press 90# x 20, 105# x 15, 120# x 5 without assistance to initiate this session  Sit to stand without UE support from regular height chair with 1 Airex pads x5, 2  reps of partial sit to stand, min-mod difficulty with eccentric lowering to chair Seated hip flexion marches alternating LE x20; Seated clams with blue theraband resistance x20 Seated adductor squeeze with manual resistance 2 x 10; Seated R LAQ with manual resistance 2x 10 ; Gait ambulation with 1 seated rest break, able to tolerate upright positioning and ambulation with R prosthetic for at least 15 minutes today. Improved activity tolerance and endurance noted. Occasional verbal cues for arm swing during ambulation.   Doffing of prosthesis. Pt educated throughout session about proper posture and technique with exercises. Improved exercise technique, movement at target joints, use of target  muscles after min verbal, visual, tactile cues.         PT Education - 01/31/18 1223    Education Details  exercise technique/form    Person(s) Educated  Patient    Methods  Demonstration;Verbal cues    Comprehension  Verbalized understanding;Returned demonstration       PT Short Term Goals - 01/22/18 1047      PT SHORT TERM GOAL #1   Title  Patient will be independent in home exercise program to improve strength/mobility for better functional independence with ADLs.    Baseline  HEP given, 12/11/17: Performing daily:     Time  2    Period  Weeks    Status  Achieved      PT SHORT TERM GOAL #2   Title  Patient will don/doff prosthesis independently to allow for increased mobility in home.     Baseline  8/14: requires PT to guide/direct through process 8/28: independent    Time  2    Period  Weeks    Status  Achieved      PT SHORT TERM GOAL #3   Title  Patient will ambulate 10 meters with least assistive device and prosthesis to improve mobility in home.     Baseline  8/14: not walking outside of // bars yet  8/28: 63 seconds with RW and CGA     Time  2    Period  Weeks    Status  Achieved      PT SHORT TERM GOAL #4   Title  Patient will perform STS with single UE support to decrease reliance upon UE's for stability     Baseline  patient requires BUE support 11/5: 1 UR support    Time  2    Period  Weeks    Status  Achieved        PT Long Term Goals - 01/22/18 1047      PT LONG TERM GOAL #1   Title  Patient will ambulate 60 ft with least assistive AD and prosthesis to improve mobility around home and increase independence.     Baseline  8/14: requires use of // bars 8/28: 30 ft with RW and CGA 9/17: 74 ft x2 with CGA and RW     Time  4    Period  Weeks    Status  Achieved      PT LONG TERM GOAL #2   Title  Patient (> 62 years old) will complete five times sit to stand test in < 15 seconds indicating an increased LE strength and improved balance.    Baseline   8/14: 61 seconds 8/28; 42 seconds 9/17: 24 seconds BUE support with LLE 10/10: 17 seconds with excessive BUE support  11/5: 18seconds BUE support; 12/11/17: 12/11/17: 18.5s with BUE support but no AD, 01/22/18: 17.1s BUE support but  no AD    Time  4    Period  Weeks    Status  Partially Met    Target Date  02/05/18      PT LONG TERM GOAL #3   Title  Patient will reduce timed up and go to <11 seconds to reduce fall risk and demonstrate improved transfer/gait ability.    Baseline  8/14: unable to perform 8/28: 1 min 30 seconds 9/17: 38 seconds RW 10/10: 25 seconds with RW 11/5: 20 seconds with cane;, 12/11/17: 17.4s with spc; 01/22/18: 14.8s with spc    Time  4    Period  Weeks    Status  On-going    Target Date  02/05/18      PT LONG TERM GOAL #4   Title  Patient will increase lower extremity functional scale to >60/80 to demonstrate improved functional mobility and increased tolerance with ADLs.     Baseline  814: 29/80 8/28: 20/80 10/10: 20/80 11/5: 22/80; 12/11/17: 18/80; 01/22/18: 27/80    Time  4    Period  Weeks    Status  On-going    Target Date  02/05/18      PT LONG TERM GOAL #5   Title  Patient will increase BLE gross strength to 4+/5 as to improve functional strength for independent gait, increased standing tolerance and increased ADL ability.    Baseline  8/14: 4-/15 8/28: 4-/5  9/17: L 4-/5 R 4/5      Time  4    Period  Weeks    Status  Achieved      PT LONG TERM GOAL #6   Title  Patient will perform 10 MWT in >.5 m/s for improved mobility and safety with negotiating natural environment    Baseline  8/28: .16 m/s with RW  9/17: .53 m/s;     Time  4    Period  Weeks    Status  Achieved      PT LONG TERM GOAL #7   Title  Patient will perform 10 MWT in >1.0 m/s for improved mobility and safety with negotiating natural environment    Baseline  9/17: .53 m/s 10/10: .63 m/s with RW 11/5: 0.34ms; 12/11/17: 15.33s = 0.65 m/s with spc; 01/22/18: self-selected: 14.4s = 0.69 m/s,  fastest: 11.8s = 0.85 m/s     Time  4    Period  Weeks    Status  Partially Met    Target Date  02/05/18      PT LONG TERM GOAL #8   Title  Patient will ambulate 300 ft with least assistive AD and prosthesis to improve mobility around home and increase independence.     Baseline  9/17: 74 ft CGA with RW 10/10 ambulated 280 ft with RW, [facility]: ambulate [id number] 10/31    Time  4    Period  Weeks    Status  Achieved      PT LONG TERM GOAL  #9   TITLE  Patient will ascend/descend 4 stairs with rail assist independently without loss of balance to improve ability to get in/out of home.     Baseline  9/17: unable to negotiate stairs 10/10: ascend with step to pattern, BUE support, and CGA 11/5: patient demonstrates ability to perform CGA and BUE support    Time  4    Period  Weeks    Status  Achieved      PT LONG TERM GOAL  #10  TITLE  Patient will ascend/descend 4 stairs with rail assist independently with reciprocating pattern without loss of balance to improve ability to get in/out of home.     Baseline  11/5: step to pattern BUE support CGA; 12/11/17: BUE heavy support and reciprocal pattern;    Time  4    Period  Weeks    Status  Achieved      PT LONG TERM GOAL  #11   TITLE  Patient will ambulate >109f during 622m with LRAD to demonstrate improved community ambulation and functional mobility     Baseline  11/5: will complete next session; 01/22/18: 400' in 3 minutes but had to stop secondary to pain in knee    Time  4    Period  Weeks    Status  Partially Met    Target Date  02/05/18            Plan - 01/31/18 1227    Clinical Impression Statement  Patient continued to demonstrated improved activity tolerance/endurance this session. continues to be motivated as well. Some improvements noted with  strengthening exercises as well. Overall the patient continues to progress towards goals and would benefit from continued skilled PT to maximize function and mobility.      Rehab Potential  Fair    Clinical Impairments Affecting Rehab Potential  (+) previous independence, motivation to return to walking (-) lives alone, hx of diabetes, limited vision     PT Frequency  2x / week    PT Duration  8 weeks    PT Treatment/Interventions  ADLs/Self Care Home Management;Cryotherapy;Electrical Stimulation;Ultrasound;Traction;Moist Heat;DME Instruction;Gait training;Stair training;Functional mobility training;Therapeutic activities;Therapeutic exercise;Patient/family education;Neuromuscular re-education;Balance training;Prosthetic Training;Wheelchair mobility training;Manual techniques;Manual lymph drainage;Compression bandaging;Taping;Energy conservation;Passive range of motion    PT Next Visit Plan  gait training, strengthening, balance    PT Home Exercise Plan  see [id number] [facility], spc ambulation    Consulted and Agree with Plan of Care  Patient       Patient will benefit from skilled therapeutic intervention in order to improve the following deficits and impairments:  Abnormal gait, Decreased activity tolerance, Decreased balance, Decreased knowledge of precautions, Decreased endurance, Decreased knowledge of use of DME, Decreased mobility, Decreased range of motion, Difficulty walking, Decreased strength, Increased edema, Impaired flexibility, Impaired perceived functional ability, Prosthetic Dependency, Postural dysfunction, Improper body mechanics, Pain  Visit Diagnosis: Muscle weakness (generalized)  Unsteadiness on feet  Other abnormalities of gait and mobility  Unilateral complete BKA, right, sequela ([place]    Problem List Patient Active Problem List   Diagnosis Date Noted  . Abnormality of gait 10/12/2017  . Poorly controlled type 2 diabetes mellitus with peripheral neuropathy (HCM[place]antuck[place]  . Flatulence   . Hypomagnesemia   . Unilateral complete BKA, right, sequela (HCArk[place]  . Benign essential HTN   . Hypoalbuminemia due to protein-calorie  malnutrition (HCCulberts[place]  . S/P below knee amputation, right (HCCarson04/03/2017  . Acute blood loss anemia   . Other encephalopathy 04/08/2017  . Encephalopathy 04/08/2017  . Altered mental status 04/08/2017  . Labile blood pressure   . Labile blood glucose   . Drug induced constipation   . Stage 3 chronic kidney disease (HCFree[place]  . Bacteremia   . S/P unilateral BKA (below knee amputation), right (HCSherrard03/26/2019  . PAD (peripheral artery disease) (HCTable [place]  . Type 2 diabetes mellitus with right diabetic foot ulcer (HCR[place]  . Post-operative pain   . Legally blind   .  Upper GI bleed   . Streptococcal bacteremia 04/01/2017  . Hypokalemia 03/30/2017  . Uncontrolled type 2 diabetes mellitus with hyperglycemia, with long-term current use of insulin ([place]) 03/30/2017  . Type 2 diabetes mellitus with peripheral neuropathy ([place]) 03/30/2017  . AKI (acute kidney injury) ([place]) 03/30/2017  . CKD stage 3 due to type 2 diabetes mellitus ([place]) 03/30/2017  . Sepsis ([place]) 03/30/2017  . Heart murmur 11/22/2016  . Hyperlipidemia associated with type 2 diabetes mellitus ([place]) 11/22/2016  . Obstructive sleep apnea syndrome 11/22/2016  . Essential hypertension 12/17/2012    Lieutenant Diego PT, DPT 12:29 PM,01/31/18 [place] MAIN [facility] SERVICES [address] [place], [place], [address] Phone: [phone]   Fax:  [phone]  Name: Trevor [name] MRN: [id number] Date of Birth: Sep 25, 1955

## 2018-02-05 ENCOUNTER — Ambulatory Visit (INDEPENDENT_AMBULATORY_CARE_PROVIDER_SITE_OTHER): Payer: Medicare Other | Admitting: Podiatry

## 2018-02-05 ENCOUNTER — Encounter: Payer: Self-pay | Admitting: Podiatry

## 2018-02-05 DIAGNOSIS — M79675 Pain in left toe(s): Secondary | ICD-10-CM

## 2018-02-05 DIAGNOSIS — B351 Tinea unguium: Secondary | ICD-10-CM | POA: Diagnosis not present

## 2018-02-05 DIAGNOSIS — M79674 Pain in right toe(s): Secondary | ICD-10-CM | POA: Diagnosis not present

## 2018-02-05 DIAGNOSIS — L309 Dermatitis, unspecified: Secondary | ICD-10-CM

## 2018-02-05 DIAGNOSIS — S88119D Complete traumatic amputation at level between knee and ankle, unspecified lower leg, subsequent encounter: Secondary | ICD-10-CM

## 2018-02-05 DIAGNOSIS — E1142 Type 2 diabetes mellitus with diabetic polyneuropathy: Secondary | ICD-10-CM

## 2018-02-05 MED ORDER — AMMONIUM LACTATE 12 % EX CREA: TOPICAL_CREAM | CUTANEOUS | 0 refills | Status: DC | PRN

## 2018-02-05 NOTE — Progress Notes (Signed)
Complaint:  Visit Type: Patient returns to my office for continued preventative foot care services. Complaint: Patient states" my nails have grown long and thick and become painful to walk and wear shoes" Patient has been diagnosed with DM with neuropathy left foot.  Patient has amputation right foot.. The patient presents for preventative foot care services. No changes to ROS.  Patient has diabetic neuropathy and angiopathy.  Podiatric Exam: Vascular: dorsalis pedis and posterior tibial pulses are not  palpable bilateral. Capillary return is immediate. Temperature gradient is WNL. Skin turgor WNL  Sensorium: Diminished  Semmes Weinstein monofilament test. Normal tactile sensation bilaterally. Nail Exam: Pt has thick disfigured discolored nails with subungual debris noted left  entire nail hallux through fifth toenails Ulcer Exam: There is no evidence of ulcer or pre-ulcerative changes or infection. Orthopedic Exam: Muscle tone and strength are WNL. No limitations in general ROM. No crepitus or effusions noted. Foot type and digits show no abnormalities. Bony prominences are unremarkable. Skin: No Porokeratosis. No infection or ulcers.  Crust with fizzures medial plantar aspect left foot.  Diagnosis:  Onychomycosis, , Pain in right toe, pain in left toes  Treatment & Plan Procedures and Treatment: Consent by patient was obtained for treatment procedures.   Debridement of mycotic and hypertrophic toenails, 1 through 5 bilateral and clearing of subungual debris. No ulceration, no infection noted. Prescribe lac-hydrin. Return Visit-Office Procedure: Patient instructed to return to the office for a follow up visit 10 weeks for continued evaluation and treatment.    Gardiner Barefoot DPM

## 2018-02-07 ENCOUNTER — Ambulatory Visit: Payer: Medicare Other

## 2018-02-07 VITALS — BP 159/80 | HR 60

## 2018-02-07 DIAGNOSIS — R2689 Other abnormalities of gait and mobility: Secondary | ICD-10-CM

## 2018-02-07 DIAGNOSIS — M6281 Muscle weakness (generalized): Secondary | ICD-10-CM

## 2018-02-07 DIAGNOSIS — R2681 Unsteadiness on feet: Secondary | ICD-10-CM

## 2018-02-07 NOTE — Therapy (Signed)
[place] MAIN [facility] SERVICES [address] [place], [place], [address] Phone: [phone]   Fax:  [phone]  Physical Therapy Treatment/Recertification   Patient Details  Name: Trevor [name] MRN: [id number] Date of Birth: 25-Oct-1955 Referring Provider (PT): Jamse Arn, DR   Encounter Date: 02/07/2018    PT End of Session - 02/07/18 1259    Visit Number  33    Number of Visits  38    Date for PT Re-Evaluation  04/04/18    Authorization Type  Eval 08/23/17; goals updated 01/22/18    PT Start Time  1105    PT Stop Time  1150    PT Time Calculation (min)  45 min    Equipment Utilized During Treatment  Gait belt;Other (comment)   R prosthesis   Activity Tolerance  Patient tolerated treatment well    Behavior During Therapy  WFL for tasks assessed/performed       Past Medical History:  Diagnosis Date  . Diabetes mellitus without complication ([place])   . Heart murmur   . Hyperlipidemia   . Hypertension   . Sleep apnea     Past Surgical History:  Procedure Laterality Date  . AMPUTATION Right 03/31/2017   Procedure: RIGHT FOOT 1ST AND 2ND RAY AMPUTATION;  Surgeon: Newt Minion, MD;  Location: [place];  Service: Orthopedics;  Laterality: Right;  . AMPUTATION Right 04/01/2017   Procedure: AMPUTATION BELOW KNEE;  Surgeon: Newt Minion, MD;  Location: [place];  Service: Orthopedics;  Laterality: Right;  . COLONOSCOPY WITH PROPOFOL N/A 10/02/2017   Procedure: COLONOSCOPY WITH PROPOFOL;  Surgeon: [name], MD;  Location: [facility] ENDOSCOPY;  Service: Gastroenterology;  Laterality: N/A;  . CORNEAL TRANSPLANT      Vitals:   02/07/18 1111  BP: (!) 159/80  Pulse: 60  SpO2: 100%    Subjective Assessment - 02/07/18 1257    Subjective  Patient has no complaints of pain at start of session. He had his prosthetic modified with a spacer at the top of his pin in order to improve the congruence of his patelar tendon with the tendon ridge in  the socket. Pt reports that he feels like his is more unsteady and his leg wants to buckle since the modifications were made.    Pertinent History  63 year old right-handed male with history of diabetes mellitus, hypertension, legally blind, and CKD presenting for BKA 04/01/17. Patient wears contacts and can see better now.  Patient had two surgeries, one was a couple toes then they decided to go below the knee. 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 Has ambulated with RW in hospital with PT. Patient has had home health therapy and was in inpatient rehab about a month. Patient reports he was mainly sitting down, with occasional walking, last home health therapy in May. Patient reports some standing up but no exercises. Patient lives alone in the house and is scared they might fall. Patient received prosthetic leg Tuesday of last week.    Limitations  Standing;Walking;House hold activities;Other (comment)    How long can you stand comfortably?  10-15 minutes    How long can you walk comfortably?  10-15 minutes    Patient Stated Goals  going up steps and driving car.     Currently in Pain?  No/denies          TREATMENT  Donning of  prosthesis   Therapeutic Exercise: NuStep L4 for warm-up during history x 5 minutes; Hookying R SLR 2 x 10; Hooklying bridges with LLE slightly more extended to encouraged weightbearing more through RLE 2 x 10; LLE; Hooklying clams with manual resistance from therapist 2 x 10; Hooklying adductor squeeze with manual resistance from therapist 2 x 10; L sidelying R hip abduction SLR x 10;   Gait Training Gait trainingin hall x 700' with spc. Pt reports increase in pain on the outside of his distal residual R limp. Prosthesis doffed and adjusted. Pain appears to be coming from the area where the new spacer is putting pressure on the rim of the pin and pressing into his distal limb. Prosthesis  turned slightly and donned again. Slightly less pain but still uncomfortable. Pt reports feeling like his RLE is going to buckle at R terminal stace/push off during gait. He doesn't feel comfortable attempting ambulation today without his cane. Patient requires SBA/CGA throughout ambulation. Pt performed horizontal and vertical head turns on command as well as gait speed changes however speed change is minimal. No rest breaks required today.     Doffing of prosthesis. Pt educated throughout session about proper posture and technique with exercises. Improved exercise technique, movement at target joints, use of target muscles after min verbal, visual, tactile cues.   Pt demonstrating improvement in his outcome measures when they were last updated on 01/22/18. His 5TSTS decreased from 18.5s to 17.1s however he continued to require considerable assistance from BUE to stand. His TUG decreased from 17.4s to 14.8s and his 75mgait speed improved from 0.65 m/s to 0.85 m/s. His LEFS increased to 27/80. Attempted 6MWT however pt had to stop secondary to increase in R knee pain. He was able to complete 400' in 3 minutes. He is reporting pain today during ambulation and pain appears to be coming from the area where the new spacer is putting pressure on the rim of the pin and pressing into his distal limb. Prosthesis turned slightly and donned again. Slightly less pain but still uncomfortable. Pt reports feeling like his RLE is going to buckle at R terminal stace/push off during gait. He doesn't feel comfortable attempting ambulation today without his cane. He has a follow-up appointment with his prosthetist in a couple weeks and he will address this issue.  He will continue to benefit from skilled physical therapy to improve functional mobility, improve prosthetic use, and increase ambulation to increase LOF and improve overall QOL.                        PT Short Term Goals - 01/22/18 1047       PT SHORT TERM GOAL #1   Title  Patient will be independent in home exercise program to improve strength/mobility for better functional independence with ADLs.    Baseline  HEP given, 12/11/17: Performing daily:     Time  2    Period  Weeks    Status  Achieved      PT SHORT TERM GOAL #2   Title  Patient will don/doff prosthesis independently to allow for increased mobility in home.     Baseline  8/14: requires PT to guide/direct through process 8/28: independent    Time  2    Period  Weeks    Status  Achieved      PT SHORT TERM GOAL #3   Title  Patient will ambulate 10 meters with least assistive device  and prosthesis to improve mobility in home.     Baseline  8/14: not walking outside of // bars yet  8/28: 63 seconds with RW and CGA     Time  2    Period  Weeks    Status  Achieved      PT SHORT TERM GOAL #4   Title  Patient will perform STS with single UE support to decrease reliance upon UE's for stability     Baseline  patient requires BUE support 11/5: 1 UR support    Time  2    Period  Weeks    Status  Achieved        PT Long Term Goals - 01/22/18 1047      PT LONG TERM GOAL #1   Title  Patient will ambulate 60 ft with least assistive AD and prosthesis to improve mobility around home and increase independence.     Baseline  8/14: requires use of // bars 8/28: 30 ft with RW and CGA 9/17: 74 ft x2 with CGA and RW     Time  4    Period  Weeks    Status  Achieved      PT LONG TERM GOAL #2   Title  Patient (> 34 years old) will complete five times sit to stand test in < 15 seconds indicating an increased LE strength and improved balance.    Baseline  8/14: 61 seconds 8/28; 42 seconds 9/17: 24 seconds BUE support with LLE 10/10: 17 seconds with excessive BUE support  11/5: 18seconds BUE support; 12/11/17: 12/11/17: 18.5s with BUE support but no AD, 01/22/18: 17.1s BUE support but no AD    Time  4    Period  Weeks    Status  Partially Met    Target Date  02/05/18       PT LONG TERM GOAL #3   Title  Patient will reduce timed up and go to <11 seconds to reduce fall risk and demonstrate improved transfer/gait ability.    Baseline  8/14: unable to perform 8/28: 1 min 30 seconds 9/17: 38 seconds RW 10/10: 25 seconds with RW 11/5: 20 seconds with cane;, 12/11/17: 17.4s with spc; 01/22/18: 14.8s with spc    Time  4    Period  Weeks    Status  On-going    Target Date  02/05/18      PT LONG TERM GOAL #4   Title  Patient will increase lower extremity functional scale to >60/80 to demonstrate improved functional mobility and increased tolerance with ADLs.     Baseline  814: 29/80 8/28: 20/80 10/10: 20/80 11/5: 22/80; 12/11/17: 18/80; 01/22/18: 27/80    Time  4    Period  Weeks    Status  On-going    Target Date  02/05/18      PT LONG TERM GOAL #5   Title  Patient will increase BLE gross strength to 4+/5 as to improve functional strength for independent gait, increased standing tolerance and increased ADL ability.    Baseline  8/14: 4-/15 8/28: 4-/5  9/17: L 4-/5 R 4/5      Time  4    Period  Weeks    Status  Achieved      PT LONG TERM GOAL #6   Title  Patient will perform 10 MWT in >.5 m/s for improved mobility and safety with negotiating natural environment    Baseline  8/28: .16 m/s with RW  9/17: .53  m/s;     Time  4    Period  Weeks    Status  Achieved      PT LONG TERM GOAL #7   Title  Patient will perform 10 MWT in >1.0 m/s for improved mobility and safety with negotiating natural environment    Baseline  9/17: .53 m/s 10/10: .63 m/s with RW 11/5: 0.49ms; 12/11/17: 15.33s = 0.65 m/s with spc; 01/22/18: self-selected: 14.4s = 0.69 m/s, fastest: 11.8s = 0.85 m/s     Time  4    Period  Weeks    Status  Partially Met    Target Date  02/05/18      PT LONG TERM GOAL #8   Title  Patient will ambulate 300 ft with least assistive AD and prosthesis to improve mobility around home and increase independence.     Baseline  9/17: 74 ft CGA with RW 10/10 ambulated  280 ft with RW, [facility] with [facility]11/5: ambulate [id number] 10/31    Time  4    Period  Weeks    Status  Achieved      PT LONG TERM GOAL  #9   TITLE  Patient will ascend/descend 4 stairs with rail assist independently without loss of balance to improve ability to get in/out of home.     Baseline  9/17: unable to negotiate stairs 10/10: ascend with step to pattern, BUE support, and CGA 11/5: patient demonstrates ability to perform CGA and BUE support    Time  4    Period  Weeks    Status  Achieved      PT LONG TERM GOAL  #10   TITLE  Patient will ascend/descend 4 stairs with rail assist independently with reciprocating pattern without loss of balance to improve ability to get in/out of home.     Baseline  11/5: step to pattern BUE support CGA; 12/11/17: BUE heavy support and reciprocal pattern;    Time  4    Period  Weeks    Status  Achieved      PT LONG TERM GOAL  #11   TITLE  Patient will ambulate >[id number] [id number] LRAD to demonstrate improved community ambulation and functional mobility     Baseline  11/5: will complete next session; 01/22/18: 400' in 3 minutes but had to stop secondary to pain in knee    Time  4    Period  Weeks    Status  Partially Met    Target Date  02/05/18            Plan - 02/07/18 1300    Clinical Impression Statement  Pt demonstrating improvement in his outcome measures when they were last updated on 01/22/18. His 5TSTS decreased from 18.5s to 17.1s however he continued to require considerable assistance from BUE to stand. His TUG decreased from 17.4s to 14.8s and his 69m 62m speed improved from 0.65 m/s to 0.85 m/s. His LEFS increased to 27/80. Attempted 6MWT however pt had to stop secondary to increase in R knee pain. He was able to complete 400' in 3 minutes. He is reporting pain today during ambulation and pain appears to be coming from the area where the new spacer is putting pressure on the rim of the pin and pressing into his distal limb.  Prosthesis turned slightly and donned again. Slightly less pain but still uncomfortable. Pt reports feeling like his RLE is going to buckle at R terminal stace/push off during gait.  He doesn't feel comfortable attempting ambulation today without his cane. He has a follow-up appointment with his prosthetist in a couple weeks and he will address this issue.  He will continue to benefit from skilled physical therapy to improve functional mobility, improve prosthetic use, and increase ambulation to increase LOF and improve overall QOL.    Rehab Potential  Fair    Clinical Impairments Affecting Rehab Potential  (+) previous independence, motivation to return to walking (-) lives alone, hx of diabetes, limited vision     PT Frequency  2x / week    PT Duration  8 weeks    PT Treatment/Interventions  ADLs/Self Care Home Management;Cryotherapy;Electrical Stimulation;Ultrasound;Traction;Moist Heat;DME Instruction;Gait training;Stair training;Functional mobility training;Therapeutic activities;Therapeutic exercise;Patient/family education;Neuromuscular re-education;Balance training;Prosthetic Training;Wheelchair mobility training;Manual techniques;Manual lymph drainage;Compression bandaging;Taping;Energy conservation;Passive range of motion    PT Next Visit Plan  gait training, strengthening, balance    PT Home Exercise Plan  see [id number] on medbridge, spc ambulation    Consulted and Agree with Plan of Care  Patient       Patient will benefit from skilled therapeutic intervention in order to improve the following deficits and impairments:  Abnormal gait, Decreased activity tolerance, Decreased balance, Decreased knowledge of precautions, Decreased endurance, Decreased knowledge of use of DME, Decreased mobility, Decreased range of motion, Difficulty walking, Decreased strength, Increased edema, Impaired flexibility, Impaired perceived functional ability, Prosthetic Dependency, Postural dysfunction, Improper body  mechanics, Pain  Visit Diagnosis: Muscle weakness (generalized)  Unsteadiness on feet  Other abnormalities of gait and mobility     Problem List Patient Active Problem List   Diagnosis Date Noted  . Abnormality of gait 10/12/2017  . Poorly controlled type 2 diabetes mellitus with peripheral neuropathy ([place])   . Flatulence   . Hypomagnesemia   . Unilateral complete BKA, right, sequela ([place])   . Benign essential HTN   . Hypoalbuminemia due to protein-calorie malnutrition ([place])   . S/P below knee amputation, right ([place]) 04/12/2017  . Acute blood loss anemia   . Other encephalopathy 04/08/2017  . Encephalopathy 04/08/2017  . Altered mental status 04/08/2017  . Labile blood pressure   . Labile blood glucose   . Drug induced constipation   . Stage 3 chronic kidney disease ([place])   . Bacteremia   . S/P unilateral BKA (below knee amputation), right ([place]) 04/04/2017  . PAD (peripheral artery disease) ([place])   . Type 2 diabetes mellitus with right diabetic foot ulcer ([place])   . Post-operative pain   . Legally blind   . Upper GI bleed   . Streptococcal bacteremia 04/01/2017  . Hypokalemia 03/30/2017  . Uncontrolled type 2 diabetes mellitus with hyperglycemia, with long-term current use of insulin ([place]) 03/30/2017  . Type 2 diabetes mellitus with peripheral neuropathy ([place]) 03/30/2017  . AKI (acute kidney injury) ([place]) 03/30/2017  . CKD stage 3 due to type 2 diabetes mellitus ([place]) 03/30/2017  . Sepsis ([place]) 03/30/2017  . Heart murmur 11/22/2016  . Hyperlipidemia associated with type 2 diabetes mellitus ([place]) 11/22/2016  . Obstructive sleep apnea syndrome 11/22/2016  . Essential hypertension 12/17/2012   Phillips Grout PT, DPT, GCS  , 02/08/2018, 9:23 AM  [place] MAIN [facility] SERVICES [address] [place], [place], [address] Phone: [phone]   Fax:  [phone]  Name: Trevor [name] MRN: [id number] Date of Birth:  09-19-1955

## 2018-02-08 ENCOUNTER — Ambulatory Visit (INDEPENDENT_AMBULATORY_CARE_PROVIDER_SITE_OTHER): Payer: Medicare Other | Admitting: Physician Assistant

## 2018-02-08 ENCOUNTER — Encounter (INDEPENDENT_AMBULATORY_CARE_PROVIDER_SITE_OTHER): Payer: Self-pay | Admitting: Orthopedic Surgery

## 2018-02-08 VITALS — Ht 76.0 in | Wt 285.0 lb

## 2018-02-08 DIAGNOSIS — E1169 Type 2 diabetes mellitus with other specified complication: Secondary | ICD-10-CM

## 2018-02-08 DIAGNOSIS — Z89511 Acquired absence of right leg below knee: Secondary | ICD-10-CM

## 2018-02-08 DIAGNOSIS — E1129 Type 2 diabetes mellitus with other diabetic kidney complication: Secondary | ICD-10-CM | POA: Diagnosis not present

## 2018-02-08 DIAGNOSIS — E1140 Type 2 diabetes mellitus with diabetic neuropathy, unspecified: Secondary | ICD-10-CM

## 2018-02-08 NOTE — Addendum Note (Signed)
Addended by: Roxana Hires D on: 02/08/2018 09:25 AM   Modules accepted: Orders

## 2018-02-11 ENCOUNTER — Encounter (INDEPENDENT_AMBULATORY_CARE_PROVIDER_SITE_OTHER): Payer: Self-pay | Admitting: Physician Assistant

## 2018-02-11 NOTE — Progress Notes (Signed)
Office Visit Note   Patient: Trevor [name]           Date of Birth: 02-05-1955           MRN: [id number] Visit Date: 02/08/2018              Requested by: [name], MD [place], [place] [address] PCP: [name], MD  Chief Complaint  Patient presents with  . Right Leg - Follow-up    04/01/17 right BKA  [employer]       HPI: The patient is a 63 year old gentleman who is status post a right transtibial amputation in March 2019.  [employer] fabricated his prosthetic.  He was having some end bearing pain over the distal residual limb and adjustments were made by [employer] for this with a spacer put over the end of the socket which is helped with the end bearing pain.  He reports that following this however his limb feels too long and he has reverted to going back to walking with a walker due to concerns about safe ambulation.  He is using a 5 ply sock and does have new stump shrinkers.  Assessment & Plan: Visit Diagnoses:  1. Acquired absence of right leg below knee [facility])     Plan: Follow-up with [facility] clinic for shortening of the prosthesis as indicated.  He should continue to walk with his walker until he feels safe walking with a cane again.  He will follow-up here on an as needed basis or annually.  Follow-Up Instructions: Return in about 1 year (around 02/09/2019), or if symptoms worsen or fail to improve.   Ortho Exam  Patient is alert, oriented, no adenopathy, well-dressed, normal affect, normal respiratory effort. Examination of the right transtibial amputation shows no evidence for breakdown, good consolidation.  No pressure areas.  He has full knee extension and good flexion.  Imaging: No results found. No images are attached to the encounter.  Labs: Lab Results  Component Value Date   HGBA1C 6.4 (A) 06/26/2017   HGBA1C 12.5 03/29/2017   HGBA1C 10.2 11/22/2016   REPTSTATUS 04/14/2017 FINAL 04/09/2017   GRAMSTAIN  03/31/2017    ABUNDANT  WBC PRESENT, PREDOMINANTLY PMN ABUNDANT GRAM POSITIVE COCCI MODERATE GRAM NEGATIVE RODS Performed at [facility], [place] [address], [place], [place] [address]    CULT  04/09/2017    NO GROWTH 5 DAYS Performed at [facility] [address], [place], [place] [address]    LABORGA STREPTOCOCCUS ANGINOSIS 03/31/2017     Lab Results  Component Value Date   ALBUMIN 2.3 (L) 04/13/2017   ALBUMIN 2.4 (L) 04/11/2017   ALBUMIN 2.1 (L) 04/05/2017    Body mass index is 34.69 kg/m.  Orders:  No orders of the defined types were placed in this encounter.  No orders of the defined types were placed in this encounter.    Procedures: No procedures performed  Clinical Data: No additional findings.  ROS:  All other systems negative, except as noted in the HPI. Review of Systems  Objective: Vital Signs: Ht 6\' 4"  (1.93 m)   Wt 285 lb (129.3 kg)   BMI 34.69 kg/m   Specialty Comments:  No specialty comments available.  PMFS History: Patient Active Problem List   Diagnosis Date Noted  . Abnormality of gait 10/12/2017  . Poorly controlled type 2 diabetes mellitus with peripheral neuropathy ([place])   . Flatulence   . Hypomagnesemia   .  Unilateral complete BKA, right, sequela ([place])   . Benign essential HTN   . Hypoalbuminemia due to protein-calorie malnutrition ([place])   . S/P below knee amputation, right ([place]) 04/12/2017  . Acute blood loss anemia   . Other encephalopathy 04/08/2017  . Encephalopathy 04/08/2017  . Altered mental status 04/08/2017  . Labile blood pressure   . Labile blood glucose   . Drug induced constipation   . Stage 3 chronic kidney disease ([place])   . Bacteremia   . S/P unilateral BKA (below knee amputation), right ([place]) 04/04/2017  . PAD (peripheral artery disease) ([place])   . Type 2 diabetes mellitus with right diabetic foot ulcer ([place])   . Post-operative pain   . Legally blind   . Upper GI bleed   . Streptococcal bacteremia 04/01/2017  .  Hypokalemia 03/30/2017  . Uncontrolled type 2 diabetes mellitus with hyperglycemia, with long-term current use of insulin ([place]) 03/30/2017  . Type 2 diabetes mellitus with peripheral neuropathy ([place]) 03/30/2017  . AKI (acute kidney injury) ([place]) 03/30/2017  . CKD stage 3 due to type 2 diabetes mellitus ([place]) 03/30/2017  . Sepsis ([place]) 03/30/2017  . Heart murmur 11/22/2016  . Hyperlipidemia associated with type 2 diabetes mellitus ([place]) 11/22/2016  . Obstructive sleep apnea syndrome 11/22/2016  . Essential hypertension 12/17/2012   Past Medical History:  Diagnosis Date  . Diabetes mellitus without complication ([place])   . Heart murmur   . Hyperlipidemia   . Hypertension   . Sleep apnea     History reviewed. No pertinent family history.  Past Surgical History:  Procedure Laterality Date  . AMPUTATION Right 03/31/2017   Procedure: RIGHT FOOT 1ST AND 2ND RAY AMPUTATION;  Surgeon: Newt Minion, MD;  Location: [place];  Service: Orthopedics;  Laterality: Right;  . AMPUTATION Right 04/01/2017   Procedure: AMPUTATION BELOW KNEE;  Surgeon: Newt Minion, MD;  Location: [place];  Service: Orthopedics;  Laterality: Right;  . COLONOSCOPY WITH PROPOFOL N/A 10/02/2017   Procedure: COLONOSCOPY WITH PROPOFOL;  Surgeon: [name], MD;  Location: [facility] ENDOSCOPY;  Service: Gastroenterology;  Laterality: N/A;  . CORNEAL TRANSPLANT     Social History   Occupational History  . Not on file  Tobacco Use  . Smoking status: Never Smoker  . Smokeless tobacco: Never Used  Substance and Sexual Activity  . Alcohol use: No  . Drug use: Never  . Sexual activity: Not on file

## 2018-02-12 ENCOUNTER — Ambulatory Visit: Payer: Medicare Other

## 2018-02-14 ENCOUNTER — Ambulatory Visit: Payer: Medicare Other | Attending: Physical Medicine & Rehabilitation

## 2018-02-14 VITALS — BP 153/90 | HR 70

## 2018-02-14 DIAGNOSIS — R2681 Unsteadiness on feet: Secondary | ICD-10-CM | POA: Diagnosis present

## 2018-02-14 DIAGNOSIS — R2689 Other abnormalities of gait and mobility: Secondary | ICD-10-CM | POA: Diagnosis present

## 2018-02-14 DIAGNOSIS — M6281 Muscle weakness (generalized): Secondary | ICD-10-CM | POA: Insufficient documentation

## 2018-02-14 NOTE — Therapy (Signed)
[place] MAIN [facility] SERVICES [address] [place], [place], [address] Phone: [phone]   Fax:  [phone]  Physical Therapy Treatment  Patient Details  Name: Trevor [name] MRN: [id number] Date of Birth: 31-May-1955 Referring Provider (PT): Jamse Arn, DR   Encounter Date: 02/14/2018  PT End of Session - 02/14/18 1049    Visit Number  34    Number of Visits  30    Date for PT Re-Evaluation  04/04/18    Authorization Type  Eval 08/23/17; goals updated 01/22/18    PT Start Time  1040    PT Stop Time  1125    PT Time Calculation (min)  45 min    Equipment Utilized During Treatment  Gait belt;Other (comment)   R prosthesis   Activity Tolerance  Patient tolerated treatment well    Behavior During Therapy  WFL for tasks assessed/performed       Past Medical History:  Diagnosis Date  . Diabetes mellitus without complication ([place])   . Heart murmur   . Hyperlipidemia   . Hypertension   . Sleep apnea     Past Surgical History:  Procedure Laterality Date  . AMPUTATION Right 03/31/2017   Procedure: RIGHT FOOT 1ST AND 2ND RAY AMPUTATION;  Surgeon: Newt Minion, MD;  Location: [place];  Service: Orthopedics;  Laterality: Right;  . AMPUTATION Right 04/01/2017   Procedure: AMPUTATION BELOW KNEE;  Surgeon: Newt Minion, MD;  Location: [place];  Service: Orthopedics;  Laterality: Right;  . COLONOSCOPY WITH PROPOFOL N/A 10/02/2017   Procedure: COLONOSCOPY WITH PROPOFOL;  Surgeon: [name], MD;  Location: [facility] ENDOSCOPY;  Service: Gastroenterology;  Laterality: N/A;  . CORNEAL TRANSPLANT      Vitals:   02/14/18 1042  BP: (!) 153/90  Pulse: 70  SpO2: 99%    Subjective Assessment - 02/14/18 1041    Subjective  Patient has no complaints of pain at start of session. He denies any specific questions or concerns. Pt states that he has not performed much walking since the last session because he doesn't feel safe on his leg with his  concerns for buckling.     Pertinent History  63 year old right-handed male with history of diabetes mellitus, hypertension, legally blind, and CKD presenting for BKA 04/01/17. Patient wears contacts and can see better now.  Patient had two surgeries, one was a couple toes then they decided to go below the knee. 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 Has ambulated with RW in hospital with PT. Patient has had home health therapy and was in inpatient rehab about a month. Patient reports he was mainly sitting down, with occasional walking, last home health therapy in May. Patient reports some standing up but no exercises. Patient lives alone in the house and is scared they might fall. Patient received prosthetic leg Tuesday of last week.    Limitations  Standing;Walking;House hold activities;Other (comment)    How long can you stand comfortably?  10-15 minutes    How long can you walk comfortably?  10-15 minutes    Patient Stated Goals  going up steps and driving car.     Currently in Pain?  No/denies           TREATMENT   Therapeutic Exercise: NuStep L4 for warm-up during history x 5 minutes; Hookying R SLR 2 x 10; Hooklying bridges with LLE  slightly more extended to encouraged weightbearing more through RLE 2 x 10; Hooklying clams with manual resistance from therapist 2 x 10; Hooklying adductor squeeze with manual resistance from therapist 2 x 10; Supine R hip abduction SLR 2 x 10; Hooklying resisted trunk rotation 3s hold x 10 each direction; Hooklying bilateral knee to chest lifts for lower abdominal engagement x 10;   Gait Training Gait trainingin hallx 700' with spc. No pain in residual limb with ambulation however pt does report some pain in the right side of his back toward the end of distance. He doesn't feel comfortable attempting ambulation today without his cane. Patient requires SBA/CGA throughout  ambulation.Pt performed horizontal and vertical head turns. No rest breaks required today.   Pt requested to be weighted at end of session: 293.6# - 5.8#(weight of prosthesis)= 287.8#   Pt educated throughout session about proper posture and technique with exercises. Improved exercise technique, movement at target joints, use of target muscles after min verbal, visual, tactile cues.   Pt demonstrates improved confidence during ambulation today with a single point cane however he still doesn't feel comfortable attempting ambulation today without his cane. Session focused on strengthening on the mat table as he continues to feel unstable on his newly adjusted prosthesis. He has a follow-up appointment to discuss concerns with prosthetist next week.  Hewill continue to benefit from skilled physical therapy to improve functional mobility, improve prosthetic use, and increase ambulation to increase LOF and improve overall QOL.                      PT Short Term Goals - 01/22/18 1047      PT SHORT TERM GOAL #1   Title  Patient will be independent in home exercise program to improve strength/mobility for better functional independence with ADLs.    Baseline  HEP given, 12/11/17: Performing daily:     Time  2    Period  Weeks    Status  Achieved      PT SHORT TERM GOAL #2   Title  Patient will don/doff prosthesis independently to allow for increased mobility in home.     Baseline  8/14: requires PT to guide/direct through process 8/28: independent    Time  2    Period  Weeks    Status  Achieved      PT SHORT TERM GOAL #3   Title  Patient will ambulate 10 meters with least assistive device and prosthesis to improve mobility in home.     Baseline  8/14: not walking outside of // bars yet  8/28: 63 seconds with RW and CGA     Time  2    Period  Weeks    Status  Achieved      PT SHORT TERM GOAL #4   Title  Patient will perform STS with single UE support to decrease  reliance upon UE's for stability     Baseline  patient requires BUE support 11/5: 1 UR support    Time  2    Period  Weeks    Status  Achieved        PT Long Term Goals - 01/22/18 1047      PT LONG TERM GOAL #1   Title  Patient will ambulate 60 ft with least assistive AD and prosthesis to improve mobility around home and increase independence.     Baseline  8/14: requires use of // bars 8/28: 30 ft with RW and  CGA 9/17: 74 ft x2 with CGA and RW     Time  4    Period  Weeks    Status  Achieved      PT LONG TERM GOAL #2   Title  Patient (> 24 years old) will complete five times sit to stand test in < 15 seconds indicating an increased LE strength and improved balance.    Baseline  8/14: 61 seconds 8/28; 42 seconds 9/17: 24 seconds BUE support with LLE 10/10: 17 seconds with excessive BUE support  11/5: 18seconds BUE support; 12/11/17: 12/11/17: 18.5s with BUE support but no AD, 01/22/18: 17.1s BUE support but no AD    Time  4    Period  Weeks    Status  Partially Met    Target Date  02/05/18      PT LONG TERM GOAL #3   Title  Patient will reduce timed up and go to <11 seconds to reduce fall risk and demonstrate improved transfer/gait ability.    Baseline  8/14: unable to perform 8/28: 1 min 30 seconds 9/17: 38 seconds RW 10/10: 25 seconds with RW 11/5: 20 seconds with cane;, 12/11/17: 17.4s with spc; 01/22/18: 14.8s with spc    Time  4    Period  Weeks    Status  On-going    Target Date  02/05/18      PT LONG TERM GOAL #4   Title  Patient will increase lower extremity functional scale to >60/80 to demonstrate improved functional mobility and increased tolerance with ADLs.     Baseline  814: 29/80 8/28: 20/80 10/10: 20/80 11/5: 22/80; 12/11/17: 18/80; 01/22/18: 27/80    Time  4    Period  Weeks    Status  On-going    Target Date  02/05/18      PT LONG TERM GOAL #5   Title  Patient will increase BLE gross strength to 4+/5 as to improve functional strength for independent gait,  increased standing tolerance and increased ADL ability.    Baseline  8/14: 4-/15 8/28: 4-/5  9/17: L 4-/5 R 4/5      Time  4    Period  Weeks    Status  Achieved      PT LONG TERM GOAL #6   Title  Patient will perform 10 MWT in >.5 m/s for improved mobility and safety with negotiating natural environment    Baseline  8/28: .16 m/s with RW  9/17: .53 m/s;     Time  4    Period  Weeks    Status  Achieved      PT LONG TERM GOAL #7   Title  Patient will perform 10 MWT in >1.0 m/s for improved mobility and safety with negotiating natural environment    Baseline  9/17: .53 m/s 10/10: .63 m/s with RW 11/5: 0.32ms; 12/11/17: 15.33s = 0.65 m/s with spc; 01/22/18: self-selected: 14.4s = 0.69 m/s, fastest: 11.8s = 0.85 m/s     Time  4    Period  Weeks    Status  Partially Met    Target Date  02/05/18      PT LONG TERM GOAL #8   Title  Patient will ambulate 300 ft with least assistive AD and prosthesis to improve mobility around home and increase independence.     Baseline  9/17: 74 ft CGA with RW 10/10 ambulated 280 ft with RW, [facility]: ambulate [id number] 10/31  Time  4    Period  Weeks    Status  Achieved      PT LONG TERM GOAL  #9   TITLE  Patient will ascend/descend 4 stairs with rail assist independently without loss of balance to improve ability to get in/out of home.     Baseline  9/17: unable to negotiate stairs 10/10: ascend with step to pattern, BUE support, and CGA 11/5: patient demonstrates ability to perform CGA and BUE support    Time  4    Period  Weeks    Status  Achieved      PT LONG TERM GOAL  #10   TITLE  Patient will ascend/descend 4 stairs with rail assist independently with reciprocating pattern without loss of balance to improve ability to get in/out of home.     Baseline  11/5: step to pattern BUE support CGA; 12/11/17: BUE heavy support and reciprocal pattern;    Time  4    Period  Weeks    Status  Achieved      PT LONG TERM GOAL  #11   TITLE   Patient will ambulate >1018f during 627m with LRAD to demonstrate improved community ambulation and functional mobility     Baseline  11/5: will complete next session; 01/22/18: 400' in 3 minutes but had to stop secondary to pain in knee    Time  4    Period  Weeks    Status  Partially Met    Target Date  02/05/18            Plan - 02/14/18 1050    Clinical Impression Statement  Pt demonstrates improved confidence during ambulation today with a single point cane however he still doesn't feel comfortable attempting ambulation today without his cane. Session focused on strengthening on the mat table as he continues to feel unstable on his newly adjusted prosthesis. He has a follow-up appointment to discuss concerns with prosthetist next week.  Hewill continue to benefit from skilled physical therapy to improve functional mobility, improve prosthetic use, and increase ambulation to increase LOF and improve overall QOL.    Rehab Potential  Fair    Clinical Impairments Affecting Rehab Potential  (+) previous independence, motivation to return to walking (-) lives alone, hx of diabetes, limited vision     PT Frequency  2x / week    PT Duration  8 weeks    PT Treatment/Interventions  ADLs/Self Care Home Management;Cryotherapy;Electrical Stimulation;Ultrasound;Traction;Moist Heat;DME Instruction;Gait training;Stair training;Functional mobility training;Therapeutic activities;Therapeutic exercise;Patient/family education;Neuromuscular re-education;Balance training;Prosthetic Training;Wheelchair mobility training;Manual techniques;Manual lymph drainage;Compression bandaging;Taping;Energy conservation;Passive range of motion    PT Next Visit Plan  gait training, strengthening, balance    PT Home Exercise Plan  see [id number] [employer]bridge, spc ambulation    Consulted and Agree with Plan of Care  Patient       Patient will benefit from skilled therapeutic intervention in order to improve the following  deficits and impairments:  Abnormal gait, Decreased activity tolerance, Decreased balance, Decreased knowledge of precautions, Decreased endurance, Decreased knowledge of use of DME, Decreased mobility, Decreased range of motion, Difficulty walking, Decreased strength, Increased edema, Impaired flexibility, Impaired perceived functional ability, Prosthetic Dependency, Postural dysfunction, Improper body mechanics, Pain  Visit Diagnosis: Muscle weakness (generalized)  Unsteadiness on feet  Other abnormalities of gait and mobility     Problem List Patient Active Problem List   Diagnosis Date Noted  . Abnormality of gait 10/12/2017  . Poorly controlled type 2 diabetes mellitus with  peripheral neuropathy ([place])   . Flatulence   . Hypomagnesemia   . Unilateral complete BKA, right, sequela ([place])   . Benign essential HTN   . Hypoalbuminemia due to protein-calorie malnutrition ([place])   . S/P below knee amputation, right ([place]) 04/12/2017  . Acute blood loss anemia   . Other encephalopathy 04/08/2017  . Encephalopathy 04/08/2017  . Altered mental status 04/08/2017  . Labile blood pressure   . Labile blood glucose   . Drug induced constipation   . Stage 3 chronic kidney disease ([place])   . Bacteremia   . S/P unilateral BKA (below knee amputation), right ([place]) 04/04/2017  . PAD (peripheral artery disease) ([place])   . Type 2 diabetes mellitus with right diabetic foot ulcer ([place])   . Post-operative pain   . Legally blind   . Upper GI bleed   . Streptococcal bacteremia 04/01/2017  . Hypokalemia 03/30/2017  . Uncontrolled type 2 diabetes mellitus with hyperglycemia, with long-term current use of insulin ([place]) 03/30/2017  . Type 2 diabetes mellitus with peripheral neuropathy ([place]) 03/30/2017  . AKI (acute kidney injury) ([place]) 03/30/2017  . CKD stage 3 due to type 2 diabetes mellitus ([place]) 03/30/2017  . Sepsis ([place]) 03/30/2017  . Heart murmur 11/22/2016  . Hyperlipidemia associated with type 2  diabetes mellitus ([place]) 11/22/2016  . Obstructive sleep apnea syndrome 11/22/2016  . Essential hypertension 12/17/2012   Phillips Grout PT, DPT, GCS  Tobiah Celestine 02/14/2018, 3:47 PM  [place] MAIN [facility] SERVICES [address] [place], [place], [address] Phone: [phone]   Fax:  [phone]  Name: Trevor [name] MRN: [id number] Date of Birth: 1955-01-29

## 2018-02-15 ENCOUNTER — Ambulatory Visit: Payer: Medicare Other

## 2018-02-15 VITALS — BP 155/82 | HR 60

## 2018-02-15 DIAGNOSIS — R2681 Unsteadiness on feet: Secondary | ICD-10-CM

## 2018-02-15 DIAGNOSIS — M6281 Muscle weakness (generalized): Secondary | ICD-10-CM | POA: Diagnosis not present

## 2018-02-15 NOTE — Therapy (Signed)
[place] MAIN [facility] SERVICES [address] [place], [place], [address] Phone: [phone]   Fax:  [phone]  Physical Therapy Treatment  Patient Details  Name: Trevor [name] MRN: [id number] Date of Birth: [birth year] Referring Provider (PT): Jamse Arn, DR   Encounter Date: 02/15/2018  PT End of Session - 02/15/18 1507    Visit Number  35    Number of Visits  42    Date for PT Re-Evaluation  04/04/18    Authorization Type  Eval 08/23/17; goals updated 01/22/18    PT Start Time  1500    PT Stop Time  1545    PT Time Calculation (min)  45 min    Equipment Utilized During Treatment  Gait belt;Other (comment)   R prosthesis   Activity Tolerance  Patient tolerated treatment well    Behavior During Therapy  WFL for tasks assessed/performed       Past Medical History:  Diagnosis Date  . Diabetes mellitus without complication ([place])   . Heart murmur   . Hyperlipidemia   . Hypertension   . Sleep apnea     Past Surgical History:  Procedure Laterality Date  . AMPUTATION Right 03/31/2017   Procedure: RIGHT FOOT 1ST AND 2ND RAY AMPUTATION;  Surgeon: Newt Minion, MD;  Location: [place];  Service: Orthopedics;  Laterality: Right;  . AMPUTATION Right 04/01/2017   Procedure: AMPUTATION BELOW KNEE;  Surgeon: Newt Minion, MD;  Location: [place];  Service: Orthopedics;  Laterality: Right;  . COLONOSCOPY WITH PROPOFOL N/A 10/02/2017   Procedure: COLONOSCOPY WITH PROPOFOL;  Surgeon: [name], MD;  Location: [facility] END[facility]COPY;  Service: Gastroenterology;  Laterality: N/A;  . CORNEAL TRANSPLANT      Vitals:   02/15/18 1503  BP: (!) 155/82  Pulse: 60  SpO2: 100%    Subjective Assessment - 02/15/18 1506    Subjective  Patient has no complaints of pain at start of session. He denies any specific questions or concerns. Pt states that he has not performed much walking since yesterday because he doesn't feel safe on his leg with his concerns for  buckling.     Pertinent History  63 year old right-handed male with history of diabetes mellitus, hypertension, legally blind, and CKD presenting for BKA 04/01/17. Patient wears contacts and can see better now.  Patient had two surgeries, one was a couple toes then they decided to go below the knee. 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 Has ambulated with RW in hospital with PT. Patient has had home health therapy and was in inpatient rehab about a month. Patient reports he was mainly sitting down, with occasional walking, last home health therapy in May. Patient reports some standing up but no exercises. Patient lives alone in the house and is scared they might fall. Patient received prosthetic leg Tuesday of last week.    How long can you stand comfortably?  10-15 minutes    How long can you walk comfortably?  10-15 minutes    Currently in Pain?  No/denies           TREATMENT   Therapeutic Exercise: NuStep L4 for warm-up during history x 5 minutes; Seated R LAQ with manual resistance x 10; Seated R HS curl with manual resistance x 10; Seated R green red tband resisted ER x 10; Seated marches with manual resistance on R side x  15 each; HookyingR SLR x 15 Hooklying bridges with LLE slightly more extended to encouraged weightbearing more through RLE 3s hold x 15; Hooklying clams with manual resistance from therapist 3s hold x 15; Hooklying adductor squeeze with manual resistance from therapist 3s hold x 15; Supine R hip abduction SLR x 10; Hooklying resisted trunk rotation 3s hold x 10 each direction; L sidelying R hip abduction x 10 with gentle assist by therapist; L sidelying R hip flexor stretch 45s x 2;   Gait Training Forward/backwards ambulation with spc 35' x 2; Side stepping with spc 35' x 2 each direction;   Pt educated throughout session about proper posture and technique with exercises.  Improved exercise technique, movement at target joints, use of target muscles after min verbal, visual, tactile cues.   Pt demonstrates improved confidence during ambulation today but is still fearful of buckling so more of session focused on strengthening. He continues to demonstrate decreased RLE strength and struggles to perform sit to stand from elevated mat table with most of his weight on RLE. Hip abduction strength is improving however he continues to be very limited in hip extension. Pt encouraged to increase the frequency of his hip flexor stretches. He has a follow-up appointment to discuss concerns with prosthetist next week.Hewill continue to benefit from skilled physical therapy to improve functional mobility, improve prosthetic use, and increase ambulation to increase LOF and improve overall QOL.                      PT Short Term Goals - 01/22/18 1047      PT SHORT TERM GOAL #1   Title  Patient will be independent in home exercise program to improve strength/mobility for better functional independence with ADLs.    Baseline  HEP given, 12/11/17: Performing daily:     Time  2    Period  Weeks    Status  Achieved      PT SHORT TERM GOAL #2   Title  Patient will don/doff prosthesis independently to allow for increased mobility in home.     Baseline  8/14: requires PT to guide/direct through process 8/28: independent    Time  2    Period  Weeks    Status  Achieved      PT SHORT TERM GOAL #3   Title  Patient will ambulate 10 meters with least assistive device and prosthesis to improve mobility in home.     Baseline  8/14: not walking outside of // bars yet  8/28: 63 seconds with RW and CGA     Time  2    Period  Weeks    Status  Achieved      PT SHORT TERM GOAL #4   Title  Patient will perform STS with single UE support to decrease reliance upon UE's for stability     Baseline  patient requires BUE support 11/5: 1 UR support    Time  2    Period   Weeks    Status  Achieved        PT Long Term Goals - 01/22/18 1047      PT LONG TERM GOAL #1   Title  Patient will ambulate 60 ft with least assistive AD and prosthesis to improve mobility around home and increase independence.     Baseline  8/14: requires use of // bars 8/28: 30 ft with RW and CGA 9/17: 74 ft x2 with CGA and RW  Time  4    Period  Weeks    Status  Achieved      PT LONG TERM GOAL #2   Title  Patient (> 30 years old) will complete five times sit to stand test in < 15 seconds indicating an increased LE strength and improved balance.    Baseline  8/14: 61 seconds 8/28; 42 seconds 9/17: 24 seconds BUE support with LLE 10/10: 17 seconds with excessive BUE support  11/5: 18seconds BUE support; 12/11/17: 12/11/17: 18.5s with BUE support but no AD, 01/22/18: 17.1s BUE support but no AD    Time  4    Period  Weeks    Status  Partially Met    Target Date  02/05/18      PT LONG TERM GOAL #3   Title  Patient will reduce timed up and go to <11 seconds to reduce fall risk and demonstrate improved transfer/gait ability.    Baseline  8/14: unable to perform 8/28: 1 min 30 seconds 9/17: 38 seconds RW 10/10: 25 seconds with RW 11/5: 20 seconds with cane;, 12/11/17: 17.4s with spc; 01/22/18: 14.8s with spc    Time  4    Period  Weeks    Status  On-going    Target Date  02/05/18      PT LONG TERM GOAL #4   Title  Patient will increase lower extremity functional scale to >60/80 to demonstrate improved functional mobility and increased tolerance with ADLs.     Baseline  814: 29/80 8/28: 20/80 10/10: 20/80 11/5: 22/80; 12/11/17: 18/80; 01/22/18: 27/80    Time  4    Period  Weeks    Status  On-going    Target Date  02/05/18      PT LONG TERM GOAL #5   Title  Patient will increase BLE gross strength to 4+/5 as to improve functional strength for independent gait, increased standing tolerance and increased ADL ability.    Baseline  8/14: 4-/15 8/28: 4-/5  9/17: L 4-/5 R 4/5      Time  4     Period  Weeks    Status  Achieved      PT LONG TERM GOAL #6   Title  Patient will perform 10 MWT in >.5 m/s for improved mobility and safety with negotiating natural environment    Baseline  8/28: .16 m/s with RW  9/17: .53 m/s;     Time  4    Period  Weeks    Status  Achieved      PT LONG TERM GOAL #7   Title  Patient will perform 10 MWT in >1.0 m/s for improved mobility and safety with negotiating natural environment    Baseline  9/17: .53 m/s 10/10: .63 m/s with RW 11/5: 0.76ms; 12/11/17: 15.33s = 0.65 m/s with spc; 01/22/18: self-selected: 14.4s = 0.69 m/s, fastest: 11.8s = 0.85 m/s     Time  4    Period  Weeks    Status  Partially Met    Target Date  02/05/18      PT LONG TERM GOAL #8   Title  Patient will ambulate 300 ft with least assistive AD and prosthesis to improve mobility around home and increase independence.     Baseline  9/17: 74 ft CGA with RW 10/10 ambulated 280 ft with RW, [facility]: ambulate 467fon 10/31    Time  4    Period  Weeks    Status  Achieved      PT LONG TERM GOAL  #9   TITLE  Patient will ascend/descend 4 stairs with rail assist independently without loss of balance to improve ability to get in/out of home.     Baseline  9/17: unable to negotiate stairs 10/10: ascend with step to pattern, BUE support, and CGA 11/5: patient demonstrates ability to perform CGA and BUE support    Time  4    Period  Weeks    Status  Achieved      PT LONG TERM GOAL  #10   TITLE  Patient will ascend/descend 4 stairs with rail assist independently with reciprocating pattern without loss of balance to improve ability to get in/out of home.     Baseline  11/5: step to pattern BUE support CGA; 12/11/17: BUE heavy support and reciprocal pattern;    Time  4    Period  Weeks    Status  Achieved      PT LONG TERM GOAL  #11   TITLE  Patient will ambulate >1078f during 656m with LRAD to demonstrate improved community ambulation and functional mobility     Baseline   11/5: will complete next session; 01/22/18: 400' in 3 minutes but had to stop secondary to pain in knee    Time  4    Period  Weeks    Status  Partially Met    Target Date  02/05/18            Plan - 02/15/18 1507    Clinical Impression Statement  Pt demonstrates improved confidence during ambulation today but is still fearful of buckling so more of session focused on strengthening. He continues to demonstrate decreased RLE strength and struggles to perform sit to stand from elevated mat table with most of his weight on RLE. Hip abduction strength is improving however he continues to be very limited in hip extension. Pt encouraged to increase the frequency of his hip flexor stretches. He has a follow-up appointment to discuss concerns with prosthetist next week.Hewill continue to benefit from skilled physical therapy to improve functional mobility, improve prosthetic use, and increase ambulation to increase LOF and improve overall QOL.    Rehab Potential  Fair    Clinical Impairments Affecting Rehab Potential  (+) previous independence, motivation to return to walking (-) lives alone, hx of diabetes, limited vision     PT Frequency  2x / week    PT Duration  8 weeks    PT Treatment/Interventions  ADLs/Self Care Home Management;Cryotherapy;Electrical Stimulation;Ultrasound;Traction;Moist Heat;DME Instruction;Gait training;Stair training;Functional mobility training;Therapeutic activities;Therapeutic exercise;Patient/family education;Neuromuscular re-education;Balance training;Prosthetic Training;Wheelchair mobility training;Manual techniques;Manual lymph drainage;Compression bandaging;Taping;Energy conservation;Passive range of motion    PT Next Visit Plan  gait training, strengthening, balance    PT Home Exercise Plan  see [id number] medbridge, spc ambulation    Consulted and Agree with Plan of Care  Patient       Patient will benefit from skilled therapeutic intervention in order to  improve the following deficits and impairments:  Abnormal gait, Decreased activity tolerance, Decreased balance, Decreased knowledge of precautions, Decreased endurance, Decreased knowledge of use of DME, Decreased mobility, Decreased range of motion, Difficulty walking, Decreased strength, Increased edema, Impaired flexibility, Impaired perceived functional ability, Prosthetic Dependency, Postural dysfunction, Improper body mechanics, Pain  Visit Diagnosis: Muscle weakness (generalized)  Unsteadiness on feet     Problem List Patient Active Problem List   Diagnosis Date Noted  . Abnormality of gait 10/12/2017  . Poorly controlled  type 2 diabetes mellitus with peripheral neuropathy ([place])   . Flatulence   . Hypomagnesemia   . Unilateral complete BKA, right, sequela ([place])   . Benign essential HTN   . Hypoalbuminemia due to protein-calorie malnutrition ([place])   . S/P below knee amputation, right ([place]) 04/12/2017  . Acute blood loss anemia   . Other encephalopathy 04/08/2017  . Encephalopathy 04/08/2017  . Altered mental status 04/08/2017  . Labile blood pressure   . Labile blood glucose   . Drug induced constipation   . Stage 3 chronic kidney disease ([place])   . Bacteremia   . S/P unilateral BKA (below knee amputation), right ([place]) 04/04/2017  . PAD (peripheral artery disease) ([place])   . Type 2 diabetes mellitus with right diabetic foot ulcer ([place])   . Post-operative pain   . Legally blind   . Upper GI bleed   . Streptococcal bacteremia 04/01/2017  . Hypokalemia 03/30/2017  . Uncontrolled type 2 diabetes mellitus with hyperglycemia, with long-term current use of insulin ([place]) 03/30/2017  . Type 2 diabetes mellitus with peripheral neuropathy ([place]) 03/30/2017  . AKI (acute kidney injury) ([place]) 03/30/2017  . CKD stage 3 due to type 2 diabetes mellitus ([place]) 03/30/2017  . Sepsis ([place]) 03/30/2017  . Heart murmur 11/22/2016  . Hyperlipidemia associated with type 2 diabetes mellitus  ([place]) 11/22/2016  . Obstructive sleep apnea syndrome 11/22/2016  . Essential hypertension 12/17/2012   Phillips Grout PT, DPT, GCS  Venissa Nappi 02/15/2018, 4:27 PM  [place] MAIN [facility] SERVICES [address] [place], [place], [address] Phone: [phone]   Fax:  [phone]  Name: Trevor [name] MRN: [id number] Date of Birth: Jun 23, 1955

## 2018-02-19 ENCOUNTER — Ambulatory Visit: Payer: Medicare Other

## 2018-02-19 VITALS — BP 140/76 | HR 55

## 2018-02-19 DIAGNOSIS — R2689 Other abnormalities of gait and mobility: Secondary | ICD-10-CM

## 2018-02-19 DIAGNOSIS — M6281 Muscle weakness (generalized): Secondary | ICD-10-CM | POA: Diagnosis not present

## 2018-02-19 DIAGNOSIS — R2681 Unsteadiness on feet: Secondary | ICD-10-CM

## 2018-02-19 NOTE — Therapy (Signed)
[place] MAIN [facility] SERVICES [address] [place], [place], [address] Phone: [phone]   Fax:  [phone]  Physical Therapy Treatment  Patient Details  Name: Trevor [name] MRN: [id number] Date of Birth: 1955-06-09 Referring Provider (PT): Jamse Arn, DR   Encounter Date: 02/19/2018  PT End of Session - 02/19/18 1132    Visit Number  36    Number of Visits  61    Date for PT Re-Evaluation  04/04/18    Authorization Type  Eval 08/23/17; goals updated 01/22/18    PT Start Time  1118    PT Stop Time  1200    PT Time Calculation (min)  42 min    Equipment Utilized During Treatment  Gait belt;Other (comment)   R prosthesis   Activity Tolerance  Patient tolerated treatment well    Behavior During Therapy  WFL for tasks assessed/performed       Past Medical History:  Diagnosis Date  . Diabetes mellitus without complication ([place])   . Heart murmur   . Hyperlipidemia   . Hypertension   . Sleep apnea     Past Surgical History:  Procedure Laterality Date  . AMPUTATION Right 03/31/2017   Procedure: RIGHT FOOT 1ST AND 2ND RAY AMPUTATION;  Surgeon: Newt Minion, MD;  Location: [place];  Service: Orthopedics;  Laterality: Right;  . AMPUTATION Right 04/01/2017   Procedure: AMPUTATION BELOW KNEE;  Surgeon: Newt Minion, MD;  Location: [place];  Service: Orthopedics;  Laterality: Right;  . COLONOSCOPY WITH PROPOFOL N/A 10/02/2017   Procedure: COLONOSCOPY WITH PROPOFOL;  Surgeon: [name], MD;  Location: [facility] ENDOSCOPY;  Service: Gastroenterology;  Laterality: N/A;  . CORNEAL TRANSPLANT      Vitals:   02/19/18 1125  BP: 140/76  Pulse: (!) 55    Subjective Assessment - 02/19/18 1129    Subjective  Patient has no complaints of pain at start of session. He denies any specific questions or concerns. Pt states that he has walked a lot over the weekend and it really bothered his knee. He said that he was having a lot of pain at the end  of his residual limp as well as over the inferior part of his patella. He sees the prosthetist tomorrow and plans to discuss modifications to his prosthesis.     Pertinent History  63 year old right-handed male with history of diabetes mellitus, hypertension, legally blind, and CKD presenting for BKA 04/01/17. Patient wears contacts and can see better now.  Patient had two surgeries, one was a couple toes then they decided to go below the knee. 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 Has ambulated with RW in hospital with PT. Patient has had home health therapy and was in inpatient rehab about a month. Patient reports he was mainly sitting down, with occasional walking, last home health therapy in May. Patient reports some standing up but no exercises. Patient lives alone in the house and is scared they might fall. Patient received prosthetic leg Tuesday of last week.    How long can you stand comfortably?  10-15 minutes    How long can you walk comfortably?  10-15 minutes    Currently in Pain?  No/denies           TREATMENT   Therapeutic Exercise: NuStep L4 for warm-up during history x 5 minutes; HookyingR SLR 2 x 15  Hooklying bridges with LLE slightly more extended to encouraged weightbearing more through RLE 3s hold 2 x 15; Hooklying clams with manual resistance from therapist 3s hold 2 x 15; Hooklying adductor squeeze with manual resistance from therapist 3s hold 2 x 15; SupineR hip abduction SLR 2 x 15; L sidelying R hip abduction x 10 with gentle assist by therapist; Supine R hip flexor stretch off side of table 45s x 2 with re-education to patient about how to complete correctly at home.    Pt educated throughout session about proper posture and technique with exercises. Improved exercise technique, movement at target joints, use of target muscles after min verbal, visual, tactile cues.   Ptreports  persistent and worsening pain over patella and residual limb so therapist agreed to focus more on supine strengthening on mat table. He has a follow-up appointment to discuss concerns with prosthetist tomorrow. Pt is able to complete all exercises today and demonstrates continued weakness in R hip abduction and flexion as well as tight hip flexors. Pt provided re-education about how to stretch hip flexors at home.Hewill continue to benefit from skilled physical therapy to improve functional mobility, improve prosthetic use, and increase ambulation to increase LOF and improve overall QOL.                       PT Short Term Goals - 01/22/18 1047      PT SHORT TERM GOAL #1   Title  Patient will be independent in home exercise program to improve strength/mobility for better functional independence with ADLs.    Baseline  HEP given, 12/11/17: Performing daily:     Time  2    Period  Weeks    Status  Achieved      PT SHORT TERM GOAL #2   Title  Patient will don/doff prosthesis independently to allow for increased mobility in home.     Baseline  8/14: requires PT to guide/direct through process 8/28: independent    Time  2    Period  Weeks    Status  Achieved      PT SHORT TERM GOAL #3   Title  Patient will ambulate 10 meters with least assistive device and prosthesis to improve mobility in home.     Baseline  8/14: not walking outside of // bars yet  8/28: 63 seconds with RW and CGA     Time  2    Period  Weeks    Status  Achieved      PT SHORT TERM GOAL #4   Title  Patient will perform STS with single UE support to decrease reliance upon UE's for stability     Baseline  patient requires BUE support 11/5: 1 UR support    Time  2    Period  Weeks    Status  Achieved        PT Long Term Goals - 01/22/18 1047      PT LONG TERM GOAL #1   Title  Patient will ambulate 60 ft with least assistive AD and prosthesis to improve mobility around home and increase  independence.     Baseline  8/14: requires use of // bars 8/28: 30 ft with RW and CGA 9/17: 74 ft x2 with CGA and RW     Time  4    Period  Weeks    Status  Achieved      PT LONG TERM GOAL #2   Title  Patient (>  29 years old) will complete five times sit to stand test in < 15 seconds indicating an increased LE strength and improved balance.    Baseline  8/14: 61 seconds 8/28; 42 seconds 9/17: 24 seconds BUE support with LLE 10/10: 17 seconds with excessive BUE support  11/5: 18seconds BUE support; 12/11/17: 12/11/17: 18.5s with BUE support but no AD, 01/22/18: 17.1s BUE support but no AD    Time  4    Period  Weeks    Status  Partially Met    Target Date  02/05/18      PT LONG TERM GOAL #3   Title  Patient will reduce timed up and go to <11 seconds to reduce fall risk and demonstrate improved transfer/gait ability.    Baseline  8/14: unable to perform 8/28: 1 min 30 seconds 9/17: 38 seconds RW 10/10: 25 seconds with RW 11/5: 20 seconds with cane;, 12/11/17: 17.4s with spc; 01/22/18: 14.8s with spc    Time  4    Period  Weeks    Status  On-going    Target Date  02/05/18      PT LONG TERM GOAL #4   Title  Patient will increase lower extremity functional scale to >60/80 to demonstrate improved functional mobility and increased tolerance with ADLs.     Baseline  814: 29/80 8/28: 20/80 10/10: 20/80 11/5: 22/80; 12/11/17: 18/80; 01/22/18: 27/80    Time  4    Period  Weeks    Status  On-going    Target Date  02/05/18      PT LONG TERM GOAL #5   Title  Patient will increase BLE gross strength to 4+/5 as to improve functional strength for independent gait, increased standing tolerance and increased ADL ability.    Baseline  8/14: 4-/15 8/28: 4-/5  9/17: L 4-/5 R 4/5      Time  4    Period  Weeks    Status  Achieved      PT LONG TERM GOAL #6   Title  Patient will perform 10 MWT in >.5 m/s for improved mobility and safety with negotiating natural environment    Baseline  8/28: .16 m/s with RW   9/17: .53 m/s;     Time  4    Period  Weeks    Status  Achieved      PT LONG TERM GOAL #7   Title  Patient will perform 10 MWT in >1.0 m/s for improved mobility and safety with negotiating natural environment    Baseline  9/17: .53 m/s 10/10: .63 m/s with RW 11/5: 0.78ms; 12/11/17: 15.33s = 0.65 m/s with spc; 01/22/18: self-selected: 14.4s = 0.69 m/s, fastest: 11.8s = 0.85 m/s     Time  4    Period  Weeks    Status  Partially Met    Target Date  02/05/18      PT LONG TERM GOAL #8   Title  Patient will ambulate 300 ft with least assistive AD and prosthesis to improve mobility around home and increase independence.     Baseline  9/17: 74 ft CGA with RW 10/10 ambulated 280 ft with RW, [facility]: ambulate [id number] 10/31    Time  4    Period  Weeks    Status  Achieved      PT LONG TERM GOAL  #9   TITLE  Patient will ascend/descend 4 stairs with rail assist independently without loss of balance to  improve ability to get in/out of home.     Baseline  9/17: unable to negotiate stairs 10/10: ascend with step to pattern, BUE support, and CGA 11/5: patient demonstrates ability to perform CGA and BUE support    Time  4    Period  Weeks    Status  Achieved      PT LONG TERM GOAL  #10   TITLE  Patient will ascend/descend 4 stairs with rail assist independently with reciprocating pattern without loss of balance to improve ability to get in/out of home.     Baseline  11/5: step to pattern BUE support CGA; 12/11/17: BUE heavy support and reciprocal pattern;    Time  4    Period  Weeks    Status  Achieved      PT LONG TERM GOAL  #11   TITLE  Patient will ambulate >1041f during 65m with LRAD to demonstrate improved community ambulation and functional mobility     Baseline  11/5: will complete next session; 01/22/18: 400' in 3 minutes but had to stop secondary to pain in knee    Time  4    Period  Weeks    Status  Partially Met    Target Date  02/05/18            Plan - 02/19/18  2058    Clinical Impression Statement  Ptreports persistent and worsening pain over patella and residual limb so therapist agreed to focus more on supine strengthening on mat table. He has a follow-up appointment to discuss concerns with prosthetist tomorrow. Pt is able to complete all exercises today and demonstrates continued weakness in R hip abduction and flexion as well as tight hip flexors. Pt provided re-education about how to stretch hip flexors at home.Hewill continue to benefit from skilled physical therapy to improve functional mobility, improve prosthetic use, and increase ambulation to increase LOF and improve overall QOL.     Rehab Potential  Fair    Clinical Impairments Affecting Rehab Potential  (+) previous independence, motivation to return to walking (-) lives alone, hx of diabetes, limited vision     PT Frequency  2x / week    PT Duration  8 weeks    PT Treatment/Interventions  ADLs/Self Care Home Management;Cryotherapy;Electrical Stimulation;Ultrasound;Traction;Moist Heat;DME Instruction;Gait training;Stair training;Functional mobility training;Therapeutic activities;Therapeutic exercise;Patient/family education;Neuromuscular re-education;Balance training;Prosthetic Training;Wheelchair mobility training;Manual techniques;Manual lymph drainage;Compression bandaging;Taping;Energy conservation;Passive range of motion    PT Next Visit Plan  Update outcome measures/goals, progress standing exercises once prothesis is adjusted, gait training, strengthening, balance    PT Home Exercise Plan  see [id number] on medbridge, spc ambulation    Consulted and Agree with Plan of Care  Patient       Patient will benefit from skilled therapeutic intervention in order to improve the following deficits and impairments:  Abnormal gait, Decreased activity tolerance, Decreased balance, Decreased knowledge of precautions, Decreased endurance, Decreased knowledge of use of DME, Decreased mobility,  Decreased range of motion, Difficulty walking, Decreased strength, Increased edema, Impaired flexibility, Impaired perceived functional ability, Prosthetic Dependency, Postural dysfunction, Improper body mechanics, Pain  Visit Diagnosis: Muscle weakness (generalized)  Unsteadiness on feet  Other abnormalities of gait and mobility     Problem List Patient Active Problem List   Diagnosis Date Noted  . Abnormality of gait 10/12/2017  . Poorly controlled type 2 diabetes mellitus with peripheral neuropathy ([place]  . Flatulence   . Hypomagnesemia   . Unilateral complete BKA, right, sequela ([place]  .  Benign essential HTN   . Hypoalbuminemia due to protein-calorie malnutrition ([place])   . S/P below knee amputation, right ([place]) 04/12/2017  . Acute blood loss anemia   . Other encephalopathy 04/08/2017  . Encephalopathy 04/08/2017  . Altered mental status 04/08/2017  . Labile blood pressure   . Labile blood glucose   . Drug induced constipation   . Stage 3 chronic kidney disease ([place])   . Bacteremia   . S/P unilateral BKA (below knee amputation), right ([place]) 04/04/2017  . PAD (peripheral artery disease) ([place])   . Type 2 diabetes mellitus with right diabetic foot ulcer ([place])   . Post-operative pain   . Legally blind   . Upper GI bleed   . Streptococcal bacteremia 04/01/2017  . Hypokalemia 03/30/2017  . Uncontrolled type 2 diabetes mellitus with hyperglycemia, with long-term current use of insulin ([place]) 03/30/2017  . Type 2 diabetes mellitus with peripheral neuropathy ([place]) 03/30/2017  . AKI (acute kidney injury) ([place]) 03/30/2017  . CKD stage 3 due to type 2 diabetes mellitus ([place]) 03/30/2017  . Sepsis ([place]) 03/30/2017  . Heart murmur 11/22/2016  . Hyperlipidemia associated with type 2 diabetes mellitus ([place]) 11/22/2016  . Obstructive sleep apnea syndrome 11/22/2016  . Essential hypertension 12/17/2012   Phillips Grout PT, DPT, GCS  Keimora Swartout 02/19/2018, 9:07 PM  [place] MAIN [facility] SERVICES [address] [place], [place], [address] Phone: [phone]   Fax:  [phone]  Name: Trevor [name] MRN: [id number] Date of Birth: 1955/09/28

## 2018-02-21 ENCOUNTER — Ambulatory Visit: Payer: Medicare Other

## 2018-02-21 VITALS — BP 131/71 | HR 59

## 2018-02-21 DIAGNOSIS — M6281 Muscle weakness (generalized): Secondary | ICD-10-CM | POA: Diagnosis not present

## 2018-02-21 DIAGNOSIS — R2681 Unsteadiness on feet: Secondary | ICD-10-CM

## 2018-02-21 NOTE — Therapy (Signed)
[place] [facility] SERVICES [address] [place], [place], [address] Phone: [phone]   Fax:  [phone]  Physical Therapy Treatment  Patient Details  Name: Trevor [name] MRN: [id number] Date of Birth: [birth year] Referring Provider (PT): Jamse Arn, DR   Encounter Date: 02/21/2018  PT End of Session - 02/21/18 1120    Visit Number  37    Number of Visits  77    Date for PT Re-Evaluation  04/04/18    Authorization Type  Eval 08/23/17; goals updated 01/22/18    PT Start Time  1115    PT Stop Time  1200    PT Time Calculation (min)  45 min    Equipment Utilized During Treatment  Gait belt;Other (comment)   R prosthesis   Activity Tolerance  Patient tolerated treatment well    Behavior During Therapy  WFL for tasks assessed/performed       Past Medical History:  Diagnosis Date  . Diabetes mellitus without complication ([place])   . Heart murmur   . Hyperlipidemia   . Hypertension   . Sleep apnea     Past Surgical History:  Procedure Laterality Date  . AMPUTATION Right 03/31/2017   Procedure: RIGHT FOOT 1ST AND 2ND RAY AMPUTATION;  Surgeon: Newt Minion, MD;  Location: [place];  Service: Orthopedics;  Laterality: Right;  . AMPUTATION Right 04/01/2017   Procedure: AMPUTATION BELOW KNEE;  Surgeon: Newt Minion, MD;  Location: [place];  Service: Orthopedics;  Laterality: Right;  . COLONOSCOPY WITH PROPOFOL N/A 10/02/2017   Procedure: COLONOSCOPY WITH PROPOFOL;  Surgeon: [name], MD;  Location: [facility] ENDOSCOPY;  Service: Gastroenterology;  Laterality: N/A;  . CORNEAL TRANSPLANT      Vitals:   02/21/18 1119  BP: 131/71  Pulse: (!) 59  SpO2: 100%    Subjective Assessment - 02/21/18 1119    Subjective  Pt went to the prosthetist yesterday and states that they modified his leg but he is unclear what type of adjustments they performed. He walked a little in their office but would like to walk more during therapy to see if it  helps with his instability and pain. He denies any LE pain upon arrival.     Pertinent History  63 year old right-handed male with history of diabetes mellitus, hypertension, legally blind, and CKD presenting for BKA 04/01/17. Patient wears contacts and can see better now.  Patient had two surgeries, one was a couple toes then they decided to go below the knee. 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 Has ambulated with RW in hospital with PT. Patient has had home health therapy and was in inpatient rehab about a month. Patient reports he was mainly sitting down, with occasional walking, last home health therapy in May. Patient reports some standing up but no exercises. Patient lives alone in the house and is scared they might fall. Patient received prosthetic leg Tuesday of last week.    Limitations  Standing;Walking;House hold activities;Other (comment)    Currently in Pain?  No/denies          TREATMENT   Therapeutic Exercise: HookyingR SLR x 15 Hooklying bridges with LLE slightly more extended to encouraged weightbearing more through RLE3s hold2 x 15; Hooklying clams with manual resistance from therapist3s hold x 15; Hooklying adductor squeeze with manual resistance from therapist3s hold x 15; SupineR hip abduction SLR 2x  15; Supine R hip flexor stretch off side of table 45s x 2 with re-education to patient about how to complete correctly at home. Instructed pt today in how to perform posterior pelvic tilt to anchor pelvis; Sit to stand without UE support x 10; Alternating toe taps to 4" step without UE support x 10 each; Step-ups to 4" step alternating LE x 10 each side without UE support;   Gait Training Gait trainingin [id number]' with RW for first 100' and then spc for the rest of distance. Ptreports decrease in pain in L knee and distal end of residual limb during ambulation today compared to previous  sessions. He also reports less feelings of instability during gait. His gait kinematics look much better with improved R heel strike. Pt also appears to have improved step length and overall gait speed.   Pt educated throughout session about proper posture and technique with exercises. Improved exercise technique, movement at target joints, use of target muscles after min to mod verbal, visual, tactile cues.    Ptreports decrease in pain in L knee and distal end of residual limb during ambulation today. He also reports less feelings of instability during gait. His gait kinematics look much better with improved R heel strike. Pt also appears to have improved step length and overall gait speed. He was able to complete all supine exercises as instructed and able to include standing exercises back into his program today. He demonstrates continued weakness in R hip abduction and flexion as well as tight hip flexors.Hewill continue to benefit from skilled physical therapy to improve functional mobility, improve prosthetic use, and increase ambulation to increase LOF and improve overall QOL.                         PT Short Term Goals - 01/22/18 1047      PT SHORT TERM GOAL #1   Title  Patient will be independent in home exercise program to improve strength/mobility for better functional independence with ADLs.    Baseline  HEP given, 12/11/17: Performing daily:     Time  2    Period  Weeks    Status  Achieved      PT SHORT TERM GOAL #2   Title  Patient will don/doff prosthesis independently to allow for increased mobility in home.     Baseline  8/14: requires PT to guide/direct through process 8/28: independent    Time  2    Period  Weeks    Status  Achieved      PT SHORT TERM GOAL #3   Title  Patient will ambulate 10 meters with least assistive device and prosthesis to improve mobility in home.     Baseline  8/14: not walking outside of // bars yet  8/28: 63 seconds  with RW and CGA     Time  2    Period  Weeks    Status  Achieved      PT SHORT TERM GOAL #4   Title  Patient will perform STS with single UE support to decrease reliance upon UE's for stability     Baseline  patient requires BUE support 11/5: 1 UR support    Time  2    Period  Weeks    Status  Achieved        PT Long Term Goals - 01/22/18 1047      PT LONG TERM GOAL #1   Title  Patient will  ambulate 60 ft with least assistive AD and prosthesis to improve mobility around home and increase independence.     Baseline  8/14: requires use of // bars 8/28: 30 ft with RW and CGA 9/17: 74 ft x2 with CGA and RW     Time  4    Period  Weeks    Status  Achieved      PT LONG TERM GOAL #2   Title  Patient (> 50 years old) will complete five times sit to stand test in < 15 seconds indicating an increased LE strength and improved balance.    Baseline  8/14: 61 seconds 8/28; 42 seconds 9/17: 24 seconds BUE support with LLE 10/10: 17 seconds with excessive BUE support  11/5: 18seconds BUE support; 12/11/17: 12/11/17: 18.5s with BUE support but no AD, 01/22/18: 17.1s BUE support but no AD    Time  4    Period  Weeks    Status  Partially Met    Target Date  02/05/18      PT LONG TERM GOAL #3   Title  Patient will reduce timed up and go to <11 seconds to reduce fall risk and demonstrate improved transfer/gait ability.    Baseline  8/14: unable to perform 8/28: 1 min 30 seconds 9/17: 38 seconds RW 10/10: 25 seconds with RW 11/5: 20 seconds with cane;, 12/11/17: 17.4s with spc; 01/22/18: 14.8s with spc    Time  4    Period  Weeks    Status  On-going    Target Date  02/05/18      PT LONG TERM GOAL #4   Title  Patient will increase lower extremity functional scale to >60/80 to demonstrate improved functional mobility and increased tolerance with ADLs.     Baseline  814: 29/80 8/28: 20/80 10/10: 20/80 11/5: 22/80; 12/11/17: 18/80; 01/22/18: 27/80    Time  4    Period  Weeks    Status  On-going     Target Date  02/05/18      PT LONG TERM GOAL #5   Title  Patient will increase BLE gross strength to 4+/5 as to improve functional strength for independent gait, increased standing tolerance and increased ADL ability.    Baseline  8/14: 4-/15 8/28: 4-/5  9/17: L 4-/5 R 4/5      Time  4    Period  Weeks    Status  Achieved      PT LONG TERM GOAL #6   Title  Patient will perform 10 MWT in >.5 m/s for improved mobility and safety with negotiating natural environment    Baseline  8/28: .16 m/s with RW  9/17: .53 m/s;     Time  4    Period  Weeks    Status  Achieved      PT LONG TERM GOAL #7   Title  Patient will perform 10 MWT in >1.0 m/s for improved mobility and safety with negotiating natural environment    Baseline  9/17: .53 m/s 10/10: .63 m/s with RW 11/5: 0.37ms; 12/11/17: 15.33s = 0.65 m/s with spc; 01/22/18: self-selected: 14.4s = 0.69 m/s, fastest: 11.8s = 0.85 m/s     Time  4    Period  Weeks    Status  Partially Met    Target Date  02/05/18      PT LONG TERM GOAL #8   Title  Patient will ambulate 300 ft with least assistive AD and prosthesis to improve mobility  around home and increase independence.     Baseline  9/17: 74 ft CGA with RW 10/10 ambulated 280 ft with RW, 70 ft with [facility] 11/5: ambulate 48f on 10/31    Time  4    Period  Weeks    Status  Achieved      PT LONG TERM GOAL  #9   TITLE  Patient will ascend/descend 4 stairs with rail assist independently without loss of balance to improve ability to get in/out of home.     Baseline  9/17: unable to negotiate stairs 10/10: ascend with step to pattern, BUE support, and CGA 11/5: patient demonstrates ability to perform CGA and BUE support    Time  4    Period  Weeks    Status  Achieved      PT LONG TERM GOAL  #10   TITLE  Patient will ascend/descend 4 stairs with rail assist independently with reciprocating pattern without loss of balance to improve ability to get in/out of home.     Baseline  11/5: step to pattern  BUE support CGA; 12/11/17: BUE heavy support and reciprocal pattern;    Time  4    Period  Weeks    Status  Achieved      PT LONG TERM GOAL  #11   TITLE  Patient will ambulate >[id number] 61m39mwith LRAD to demonstrate improved community ambulation and functional mobility     Baseline  11/5: will complete next session; 01/22/18: 400' in 3 minutes but had to stop secondary to pain in knee    Time  4    Period  Weeks    Status  Partially Met    Target Date  02/05/18            Plan - 02/21/18 1120    Clinical Impression Statement  Ptreports decrease in pain in L knee and distal end of residual limb during ambulation today. He also reports less feelings of instability during gait. His gait kinematics look much better with improved R heel strike. Pt also appears to have improved step length and overall gait speed. He was able to complete all supine exercises as instructed and able to include standing exercises back into his program today. He demonstrates continued weakness in R hip abduction and flexion as well as tight hip flexors.Hewill continue to benefit from skilled physical therapy to improve functional mobility, improve prosthetic use, and increase ambulation to increase LOF and improve overall QOL.     Rehab Potential  Fair    Clinical Impairments Affecting Rehab Potential  (+) previous independence, motivation to return to walking (-) lives alone, hx of diabetes, limited vision     PT Frequency  2x / week    PT Duration  8 weeks    PT Treatment/Interventions  ADLs/Self Care Home Management;Cryotherapy;Electrical Stimulation;Ultrasound;Traction;Moist Heat;DME Instruction;Gait training;Stair training;Functional mobility training;Therapeutic activities;Therapeutic exercise;Patient/family education;Neuromuscular re-education;Balance training;Prosthetic Training;Wheelchair mobility training;Manual techniques;Manual lymph drainage;Compression bandaging;Taping;Energy conservation;Passive  range of motion    PT Next Visit Plan  Update outcome measures/goals over the next couple sessions, progress standing exercises once prothesis is adjusted, gait training, strengthening, balance    PT Home Exercise Plan  see [id number] on medbridge, spc ambulation    Consulted and Agree with Plan of Care  Patient       Patient will benefit from skilled therapeutic intervention in order to improve the following deficits and impairments:  Abnormal gait, Decreased activity tolerance, Decreased balance, Decreased knowledge of precautions, Decreased endurance,  Decreased knowledge of use of DME, Decreased mobility, Decreased range of motion, Difficulty walking, Decreased strength, Increased edema, Impaired flexibility, Impaired perceived functional ability, Prosthetic Dependency, Postural dysfunction, Improper body mechanics, Pain  Visit Diagnosis: Muscle weakness (generalized)  Unsteadiness on feet     Problem List Patient Active Problem List   Diagnosis Date Noted  . Abnormality of gait 10/12/2017  . Poorly controlled type 2 diabetes mellitus with peripheral neuropathy ([place])   . Flatulence   . Hypomagnesemia   . Unilateral complete BKA, right, sequela ([place])   . Benign essential HTN   . Hypoalbuminemia due to protein-calorie malnutrition ([place])   . S/P below knee amputation, right ([place]) 04/12/2017  . Acute blood loss anemia   . Other encephalopathy 04/08/2017  . Encephalopathy 04/08/2017  . Altered mental status 04/08/2017  . Labile blood pressure   . Labile blood glucose   . Drug induced constipation   . Stage 3 chronic kidney disease ([place])   . Bacteremia   . S/P unilateral BKA (below knee amputation), right ([place]) 04/04/2017  . PAD (peripheral artery disease) ([place])   . Type 2 diabetes mellitus with right diabetic foot ulcer ([place])   . Post-operative pain   . Legally blind   . Upper GI bleed   . Streptococcal bacteremia 04/01/2017  . Hypokalemia 03/30/2017  . Uncontrolled type 2  diabetes mellitus with hyperglycemia, with long-term current use of insulin ([place]) 03/30/2017  . Type 2 diabetes mellitus with peripheral neuropathy ([place]) 03/30/2017  . AKI (acute kidney injury) ([place]) 03/30/2017  . CKD stage 3 due to type 2 diabetes mellitus ([place]) 03/30/2017  . Sepsis ([place]) 03/30/2017  . Heart murmur 11/22/2016  . Hyperlipidemia associated with type 2 diabetes mellitus ([place]) 11/22/2016  . Obstructive sleep apnea syndrome 11/22/2016  . Essential hypertension 12/17/2012   Phillips Grout PT, DPT, GCS  Huprich,Jason 02/21/2018, 4:02 PM  [place] [facility] SERVICES [address] [place], [place], [address] Phone: [phone]   Fax:  [phone]  Name: Trevor [name] MRN: [id number] Date of Birth: Feb 25, 1955

## 2018-02-26 ENCOUNTER — Ambulatory Visit: Payer: Medicare Other

## 2018-02-26 VITALS — BP 145/75 | HR 58

## 2018-02-26 DIAGNOSIS — M6281 Muscle weakness (generalized): Secondary | ICD-10-CM | POA: Diagnosis not present

## 2018-02-26 DIAGNOSIS — R2681 Unsteadiness on feet: Secondary | ICD-10-CM

## 2018-02-26 NOTE — Therapy (Signed)
[place] MAIN [facility] SERVICES [address] [place], [place], [address] Phone: [phone]   Fax:  [phone]  Physical Therapy Treatment  Patient Details  Name: Trevor [name] MRN: [id number] Date of Birth: 1955/04/27 Referring Provider (PT): Jamse Arn, DR   Encounter Date: 02/26/2018  PT End of Session - 02/26/18 1125    Visit Number  38    Number of Visits  53    Date for PT Re-Evaluation  04/04/18    Authorization Type  Eval 08/23/17; Last goals 01/22/18, updated 02/26/18;    PT Start Time  1115    PT Stop Time  1200    PT Time Calculation (min)  45 min    Equipment Utilized During Treatment  Gait belt;Other (comment)   R prosthesis   Activity Tolerance  Patient tolerated treatment well    Behavior During Therapy  WFL for tasks assessed/performed       Past Medical History:  Diagnosis Date  . Diabetes mellitus without complication ([place])   . Heart murmur   . Hyperlipidemia   . Hypertension   . Sleep apnea     Past Surgical History:  Procedure Laterality Date  . AMPUTATION Right 03/31/2017   Procedure: RIGHT FOOT 1ST AND 2ND RAY AMPUTATION;  Surgeon: Newt Minion, MD;  Location: [place];  Service: Orthopedics;  Laterality: Right;  . AMPUTATION Right 04/01/2017   Procedure: AMPUTATION BELOW KNEE;  Surgeon: Newt Minion, MD;  Location: [place];  Service: Orthopedics;  Laterality: Right;  . COLONOSCOPY WITH PROPOFOL N/A 10/02/2017   Procedure: COLONOSCOPY WITH PROPOFOL;  Surgeon: [name], MD;  Location: [facility] ENDOSCOPY;  Service: Gastroenterology;  Laterality: N/A;  . CORNEAL TRANSPLANT      Vitals:   02/26/18 1119  BP: (!) 145/75  Pulse: (!) 58  SpO2: 100%    Subjective Assessment - 02/26/18 1124    Subjective  Pt reports that he is doing well on this date. He denies any knee pain at this time. He states that he had some phantom R toe pain last night but it has resolved. He reports less knee pain with ambulation  since his prosethic was modified.     Pertinent History  63 year old right-handed male with history of diabetes mellitus, hypertension, legally blind, and CKD presenting for BKA 04/01/17. Patient wears contacts and can see better now.  Patient had two surgeries, one was a couple toes then they decided to go below the knee. 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 Has ambulated with RW in hospital with PT. Patient has had home health therapy and was in inpatient rehab about a month. Patient reports he was mainly sitting down, with occasional walking, last home health therapy in May. Patient reports some standing up but no exercises. Patient lives alone in the house and is scared they might fall. Patient received prosthetic leg Tuesday of last week.    Limitations  Standing;Walking;House hold activities;Other (comment)    Currently in Pain?  No/denies          TREATMENT   Therapeutic Exercise: NuStep L2 x 5 minutes for warm-up; Updated goals with pt including: 5TSTS: 13.0s with BUE, still unable to perform without UE assistance; TUG: 13.0s, 33mgait speed: self-selected: 13.3s = 0.75 m/s, fastest: 10.6s = 0.94 m/s; 6MWT: 500' in 3 minutes but stopped secondary to 8/10 back pain; Pt completed  LEFS: 34/80, 27/80 (unbilled); Updated goals with patient and discussed plan of care;  Seated marches x 10 bilateral; Seated clams with manual resistance x 10; Seated adductor squeeze with manual resistance x 10; Sit to stand without UE support x 10;   Pt educated throughout session about proper posture and technique with exercises. Improved exercise technique, movement at target joints, use of target muscles after min to mod verbal, visual, tactile cues.     Pt demonstrating improvement in all of his outcome measures since they were last updated on 01/22/18. His 5TSTS decreased from 17.1s to 13.0s however he is still unable to  perform without UE support. His TUG decreased from 14.8s to 13.0s and his fastest 27mgait speed improved from 0.85 m/s to 0.94 m/s. His LEFS increased from 27/80 to 34/80. Attempted 6MWT however pt again had to stop after 3 minutes but this time due to 8/10 low back pain. Over the course of 3 minutes his distance increased from 400' on 01/22/18 to 500' today.  He is making excellent progress towards reaching his goals.Patient encouraged to continue HEP and follow-up as scheduled. He will continue to benefit from skilled physical therapy to improve functional mobility, improve prosthetic use, and increase ambulation to increase LOF and improve overall QOL.       [facility]20      6 Minute Walk- Baseline   6 Minute Walk- Baseline  yes    BP (mmHg)  145/75    HR (bpm)  58    02 Sat (%RA)  100 %    Modified Borg Scale for Dyspnea  0- Nothing at all    Perceived Rate of Exertion (Borg)  6-      6 Minute walk- Post Test   6 Minute Walk Post Test  yes    BP (mmHg)  165/73    HR (bpm)  75    02 Sat (%RA)  99 %    Modified Borg Scale for Dyspnea  8-    Perceived Rate of Exertion (Borg)  14-      6 minute walk test results    Aerobic Endurance Distance Walked  500    Endurance additional comments  Pt again only able to walk for 3 minutes before having to stop secondary to 8/10 low back pain      Standardized Balance Assessment   Standardized Balance Assessment  Timed Up and Go Test;Five Times Sit to Stand;10 meter walk test    Five times sit to stand comments   13.0s with BUE support    10 Meter Walk  self-selected: 13.3s = 0.75 m/s, fastest: 10.6s = 0.94 m/s;      Timed Up and Go Test   TUG  Normal TUG    Normal TUG (seconds)  13                           PT Short Term Goals - 02/26/18 1513      PT SHORT TERM GOAL #1   Title  Patient will be independent in home exercise program to improve strength/mobility for better functional independence  with ADLs.    Baseline  HEP given, 12/11/17: Performing daily:     Time  2    Period  Weeks    Status  Achieved      PT SHORT TERM GOAL #2   Title  Patient will don/doff prosthesis independently to allow for increased  mobility in home.     Baseline  8/14: requires PT to guide/direct through process 8/28: independent    Time  2    Period  Weeks    Status  Achieved      PT SHORT TERM GOAL #3   Title  Patient will ambulate 10 meters with least assistive device and prosthesis to improve mobility in home.     Baseline  8/14: not walking outside of // bars yet  8/28: 63 seconds with RW and CGA     Time  2    Period  Weeks    Status  Achieved      PT SHORT TERM GOAL #4   Title  Patient will perform STS with single UE support to decrease reliance upon UE's for stability     Baseline  patient requires BUE support 11/5: 1 UR support    Time  2    Period  Weeks    Status  Achieved        PT Long Term Goals - 02/26/18 1513      PT LONG TERM GOAL #1   Title  Patient will ambulate 60 ft with least assistive AD and prosthesis to improve mobility around home and increase independence.     Baseline  8/14: requires use of // bars 8/28: 30 ft with RW and CGA 9/17: 74 ft x2 with CGA and RW     Time  4    Period  Weeks    Status  Achieved      PT LONG TERM GOAL #2   Title  Patient (> 74 years old) will complete five times sit to stand test in < 15 seconds indicating an increased LE strength and improved balance.    Baseline  8/14: 61 seconds 8/28; 42 seconds 9/17: 24 seconds BUE support with LLE 10/10: 17 seconds with excessive BUE support  11/5: 18seconds BUE support; 12/11/17: 12/11/17: 18.5s with BUE support but no AD, 01/22/18: 17.1s BUE support but no AD; 02/26/18: 13.0s with BUE, still unable to perform without UE assistance    Time  4    Period  Weeks    Status  Partially Met    Target Date  04/04/18      PT LONG TERM GOAL #3   Title  Patient will reduce timed up and go to <11 seconds to  reduce fall risk and demonstrate improved transfer/gait ability.    Baseline  8/14: unable to perform 8/28: 1 min 30 seconds 9/17: 38 seconds RW 10/10: 25 seconds with RW 11/5: 20 seconds with cane;, 12/11/17: 17.4s with spc; 01/22/18: 14.8s with spc; 02/26/18: 13.0s with spc    Time  4    Period  Weeks    Status  On-going    Target Date  04/04/18      PT LONG TERM GOAL #4   Title  Patient will increase lower extremity functional scale to >60/80 to demonstrate improved functional mobility and increased tolerance with ADLs.     Baseline  814: 29/80 8/28: 20/80 10/10: 20/80 11/5: 22/80; 12/11/17: 18/80; 01/22/18: 27/80    Time  4    Period  Weeks    Status  On-going    Target Date  04/04/18      PT LONG TERM GOAL #5   Title  Patient will increase BLE gross strength to 4+/5 as to improve functional strength for independent gait, increased standing tolerance and increased ADL ability.    Baseline  8/14: 4-/15 8/28: 4-/5  9/17: L 4-/5 R 4/5      Time  4    Period  Weeks    Status  Achieved      PT LONG TERM GOAL #6   Title  Patient will perform 10 MWT in >.5 m/s for improved mobility and safety with negotiating natural environment    Baseline  8/28: .16 m/s with RW  9/17: .53 m/s;     Time  4    Period  Weeks    Status  Achieved      PT LONG TERM GOAL #7   Title  Patient will perform 10 MWT in >1.0 m/s for improved mobility and safety with negotiating natural environment    Baseline  9/17: .53 m/s 10/10: .63 m/s with RW 11/5: 0.43ms; 12/11/17: 15.33s = 0.65 m/s with spc; 01/22/18: self-selected: 14.4s = 0.69 m/s, fastest: 11.8s = 0.85 m/s; 02/26/18: self-selected: 13.3s = 0.75 m/s, fastest: 10.6s = 0.94 m/s    Time  4    Period  Weeks    Status  Partially Met    Target Date  04/04/18      PT LONG TERM GOAL #8   Title  Patient will ambulate 300 ft with least assistive AD and prosthesis to improve mobility around home and increase independence.     Baseline  9/17: 74 ft CGA with RW 10/10  ambulated 280 ft with RW, [facility]: ambulate [id number] 10/31; 01/22/18: 400', 02/26/18: 500'    Time  4    Period  Weeks    Status  Achieved      PT LONG TERM GOAL  #9   TITLE  Patient will ascend/descend 4 stairs with rail assist independently without loss of balance to improve ability to get in/out of home.     Baseline  9/17: unable to negotiate stairs 10/10: ascend with step to pattern, BUE support, and CGA 11/5: patient demonstrates ability to perform CGA and BUE support    Time  4    Period  Weeks    Status  Achieved      PT LONG TERM GOAL  #10   TITLE  Patient will ascend/descend 4 stairs with rail assist independently with reciprocating pattern without loss of balance to improve ability to get in/out of home.     Baseline  11/5: step to pattern BUE support CGA; 12/11/17: BUE heavy support and reciprocal pattern;    Time  4    Period  Weeks    Status  Achieved      PT LONG TERM GOAL  #11   TITLE  Patient will ambulate >[id number] LRAD to demonstrate improved community ambulation and functional mobility     Baseline  11/5: will complete next session; 01/22/18: 400' in 3 minutes but had to stop secondary to pain in knee; 01/22/18: 400' in 3 minutes limited by R knee pain, 02/26/18: 500' in 3 minutes limited by low back pain    Time  4    Period  Weeks    Status  Partially Met    Target Date  04/04/18            Plan - 02/26/18 1124    Clinical Impression Statement  Pt demonstrating improvement in all of his outcome measures since they were last updated on 01/22/18. His 5TSTS decreased from 17.1s to 13.0s however he is still unable to perform without UE support. His TUG decreased  from 14.8s to 13.0s and his fastest 15mgait speed improved from 0.85 m/s to 0.94 m/s. His LEFS increased from 27/80 to 34/80. Attempted 6MWT however pt again had to stop after 3 minutes but this time due to 8/10 low back pain. Over the course of 3 minutes his distance increased from  400' on 01/22/18 to 500' today.  He is making excellent progress towards reaching his goals.Patient encouraged to continue HEP and follow-up as scheduled. He will continue to benefit from skilled physical therapy to improve functional mobility, improve prosthetic use, and increase ambulation to increase LOF and improve overall QOL.    Rehab Potential  Fair    Clinical Impairments Affecting Rehab Potential  (+) previous independence, motivation to return to walking (-) lives alone, hx of diabetes, limited vision     PT Frequency  2x / week    PT Duration  8 weeks    PT Treatment/Interventions  ADLs/Self Care Home Management;Cryotherapy;Electrical Stimulation;Ultrasound;Traction;Moist Heat;DME Instruction;Gait training;Stair training;Functional mobility training;Therapeutic activities;Therapeutic exercise;Patient/family education;Neuromuscular re-education;Balance training;Prosthetic Training;Wheelchair mobility training;Manual techniques;Manual lymph drainage;Compression bandaging;Taping;Energy conservation;Passive range of motion    PT Next Visit Plan  Eventually attempt BERG balance test for goal setting, progress standing exercises once prothesis is adjusted, gait training, strengthening, balance    PT Home Exercise Plan  see [id number] on medbridge, spc ambulation    Consulted and Agree with Plan of Care  Patient       Patient will benefit from skilled therapeutic intervention in order to improve the following deficits and impairments:  Abnormal gait, Decreased activity tolerance, Decreased balance, Decreased knowledge of precautions, Decreased endurance, Decreased knowledge of use of DME, Decreased mobility, Decreased range of motion, Difficulty walking, Decreased strength, Increased edema, Impaired flexibility, Impaired perceived functional ability, Prosthetic Dependency, Postural dysfunction, Improper body mechanics, Pain  Visit Diagnosis: Muscle weakness (generalized)  Unsteadiness on  feet     Problem List Patient Active Problem List   Diagnosis Date Noted  . Abnormality of gait 10/12/2017  . Poorly controlled type 2 diabetes mellitus with peripheral neuropathy (HGlen Allen   . Flatulence   . Hypomagnesemia   . Unilateral complete BKA, right, sequela ([place]Malta [place]   . Benign essential HTN   . Hypoalbuminemia due to protein-calorie malnutrition (HFoster   . S/P below knee amputation, right (H[place] 04/12/2017  . Acute blood loss anemia   . Other encephalopathy 04/08/2017  . Encephalopathy 04/08/2017  . Altered mental status 04/08/2017  . Labile blood pressure   . Labile blood glucose   . Drug induced constipation   . Stage 3 chronic kidney disease (HPeeples [place]   . Bacteremia   . S/P unilateral BKA (below knee amputation), right ([place] 04/04/2017  . PAD (peripheral artery disease) (HFos[place]   . Type 2 diabetes mellitus with right diabetic foot ulcer (HCol[place]   . Post-operative pain   . Legally blind   . Upper GI bleed   . Streptococcal bacteremia 04/01/2017  . Hypokalemia 03/30/2017  . Uncontrolled type 2 diabetes mellitus with hyperglycemia, with long-term current use of insulin (HVerde [place] 03/30/2017  . Type 2 diabetes mellitus with peripheral neuropathy (HBo[place] 03/30/2017  . AKI (acute kidney injury) (HSanger 03/30/2017  . CKD stage 3 due to type 2 diabetes mellitus (HEl[place] 03/30/2017  . Sepsis (H[place] 03/30/2017  . Heart murmur 11/22/2016  . Hyperlipidemia associated with type 2 diabetes mellitus (HUhrichs[place] 11/22/2016  . Obstructive sleep apnea syndrome 11/22/2016  . Essential hypertension 12/17/2012   JLyndel SafeHuprich PT, DPT, GCS  Damiean Lukes 02/27/2018, 9:30 AM  [place] MAIN [facility] SERVICES [address] [place], [place], [address] Phone: [phone]   Fax:  [phone]  Name: Trevor [name] MRN: [id number] Date of Birth: January 02, 1956

## 2018-02-28 ENCOUNTER — Ambulatory Visit: Payer: Medicare Other

## 2018-02-28 VITALS — BP 136/72 | HR 58

## 2018-02-28 DIAGNOSIS — M6281 Muscle weakness (generalized): Secondary | ICD-10-CM

## 2018-02-28 DIAGNOSIS — R2681 Unsteadiness on feet: Secondary | ICD-10-CM

## 2018-02-28 DIAGNOSIS — R2689 Other abnormalities of gait and mobility: Secondary | ICD-10-CM

## 2018-02-28 NOTE — Therapy (Signed)
[place] MAIN [facility] SERVICES [address] [place], [place], [address] Phone: [phone]   Fax:  [phone]  Physical Therapy Treatment  Patient Details  Name: Trevor [name] MRN: [id number] Date of Birth: 1955/07/10 Referring Provider (PT): Jamse Arn, DR   Encounter Date: 02/28/2018  PT End of Session - 02/28/18 1040    Visit Number  [id number]    Number of Visits  75    Date for PT Re-Evaluation  04/04/18    Authorization Type  Eval 08/23/17; Last goals 02/26/18;    PT Start Time  1045    PT Stop Time  1130    PT Time Calculation (min)  45 min    Equipment Utilized During Treatment  Gait belt;Other (comment)   R prosthesis   Activity Tolerance  Patient tolerated treatment well    Behavior During Therapy  WFL for tasks assessed/performed       Past Medical History:  Diagnosis Date  . Diabetes mellitus without complication ([place])   . Heart murmur   . Hyperlipidemia   . Hypertension   . Sleep apnea     Past Surgical History:  Procedure Laterality Date  . AMPUTATION Right 03/31/2017   Procedure: RIGHT FOOT 1ST AND 2ND RAY AMPUTATION;  Surgeon: Newt Minion, MD;  Location: [place];  Service: Orthopedics;  Laterality: Right;  . AMPUTATION Right 04/01/2017   Procedure: AMPUTATION BELOW KNEE;  Surgeon: Newt Minion, MD;  Location: [place];  Service: Orthopedics;  Laterality: Right;  . COLONOSCOPY WITH PROPOFOL N/A 10/02/2017   Procedure: COLONOSCOPY WITH PROPOFOL;  Surgeon: [name], MD;  Location: [facility];  Service: Gastroenterology;  Laterality: N/A;  . CORNEAL TRANSPLANT      Vitals:   02/28/18 1051  BP: 136/72  Pulse: (!) 58  SpO2: 100%    Subjective Assessment - 02/28/18 1040    Subjective  Pt reports that he is doing well on this date. He denies any knee pain at this time. He has continued to have some phantom R toe pain. No specific questions or concerns currently.     Pertinent History  63 year old right-handed  male with history of diabetes mellitus, hypertension, legally blind, and CKD presenting for BKA 04/01/17. Patient wears contacts and can see better now.  Patient had two surgeries, one was a couple toes then they decided to go below the knee. 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 Has ambulated with RW in hospital with PT. Patient has had home health therapy and was in inpatient rehab about a month. Patient reports he was mainly sitting down, with occasional walking, last home health therapy in May. Patient reports some standing up but no exercises. Patient lives alone in the house and is scared they might fall. Patient received prosthetic leg Tuesday of last week.    Limitations  Standing;Walking;House hold activities;Other (comment)    Currently in Pain?  No/denies          TREATMENT   Therapeutic Exercise: Step-ups from Airex pad to 5" step with Airex pad on top alternating LE x 10 each side with faded UE support; Standing mini squats x 10; Standing hip abduction with bilateral UE support x 15 bilateral; Standing SLR with bilateral UE support x 15 bilateral; Standing HS curls x 15 bilateral; Sit to stand without UE support from regular height chair with 2" Airex pad on  seat x 4; Quantum R single press 105# x 12;   Gait Training Gait trainingin [id number]' with spc. Performed horizontal and vertical head turns, quick stops, gait speed changes, and 180 degree turns. Pt requires one seated rest break today. No R knee or residual limb pain during ambulation today;   Neuromuscular Re-education Alternating toe taps to 5" step without UE support x 10 each; Airex balance with toe taps to 5" step without UE support x 10 each;   Pt educated throughout session about proper posture and technique with exercises. Improved exercise technique, movement at target joints, use of target muscles after min to mod verbal,  visual, tactile cues.    Pt is able to progress to some additional standing exercises on this date as well as return to the leg press. His stability with gait has improved while performing head turns, full turns, gait speed changes, and quick stops. He will need an updated progress note at next visit. Patientencouraged to continue HEP and follow-up as scheduled. Hewill continue to benefit from skilled physical therapy to improve functional mobility, improve prosthetic use, and increase ambulation to increase LOF and improve overall QOL.                       PT Short Term Goals - 02/26/18 1513      PT SHORT TERM GOAL #1   Title  Patient will be independent in home exercise program to improve strength/mobility for better functional independence with ADLs.    Baseline  HEP given, 12/11/17: Performing daily:     Time  2    Period  Weeks    Status  Achieved      PT SHORT TERM GOAL #2   Title  Patient will don/doff prosthesis independently to allow for increased mobility in home.     Baseline  8/14: requires PT to guide/direct through process 8/28: independent    Time  2    Period  Weeks    Status  Achieved      PT SHORT TERM GOAL #3   Title  Patient will ambulate 10 meters with least assistive device and prosthesis to improve mobility in home.     Baseline  8/14: not walking outside of // bars yet  8/28: 63 seconds with RW and CGA     Time  2    Period  Weeks    Status  Achieved      PT SHORT TERM GOAL #4   Title  Patient will perform STS with single UE support to decrease reliance upon UE's for stability     Baseline  patient requires BUE support 11/5: 1 UR support    Time  2    Period  Weeks    Status  Achieved        PT Long Term Goals - 02/26/18 1513      PT LONG TERM GOAL #1   Title  Patient will ambulate 60 ft with least assistive AD and prosthesis to improve mobility around home and increase independence.     Baseline  8/14: requires use of // bars  8/28: 30 ft with RW and CGA 9/17: 74 ft x2 with CGA and RW     Time  4    Period  Weeks    Status  Achieved      PT LONG TERM GOAL #2   Title  Patient (> 60 years old) will complete five times sit to stand test  in < 15 seconds indicating an increased LE strength and improved balance.    Baseline  8/14: 61 seconds 8/28; 42 seconds 9/17: 24 seconds BUE support with LLE 10/10: 17 seconds with excessive BUE support  11/5: 18seconds BUE support; 12/11/17: 12/11/17: 18.5s with BUE support but no AD, 01/22/18: 17.1s BUE support but no AD; 02/26/18: 13.0s with BUE, still unable to perform without UE assistance    Time  4    Period  Weeks    Status  Partially Met    Target Date  04/04/18      PT LONG TERM GOAL #3   Title  Patient will reduce timed up and go to <11 seconds to reduce fall risk and demonstrate improved transfer/gait ability.    Baseline  8/14: unable to perform 8/28: 1 min 30 seconds 9/17: 38 seconds RW 10/10: 25 seconds with RW 11/5: 20 seconds with cane;, 12/11/17: 17.4s with spc; 01/22/18: 14.8s with spc; 02/26/18: 13.0s with spc    Time  4    Period  Weeks    Status  On-going    Target Date  04/04/18      PT LONG TERM GOAL #4   Title  Patient will increase lower extremity functional scale to >60/80 to demonstrate improved functional mobility and increased tolerance with ADLs.     Baseline  814: 29/80 8/28: 20/80 10/10: 20/80 11/5: 22/80; 12/11/17: 18/80; 01/22/18: 27/80    Time  4    Period  Weeks    Status  On-going    Target Date  04/04/18      PT LONG TERM GOAL #5   Title  Patient will increase BLE gross strength to 4+/5 as to improve functional strength for independent gait, increased standing tolerance and increased ADL ability.    Baseline  8/14: 4-/15 8/28: 4-/5  9/17: L 4-/5 R 4/5      Time  4    Period  Weeks    Status  Achieved      PT LONG TERM GOAL #6   Title  Patient will perform 10 MWT in >.5 m/s for improved mobility and safety with negotiating natural environment     Baseline  8/28: .16 m/s with RW  9/17: .53 m/s;     Time  4    Period  Weeks    Status  Achieved      PT LONG TERM GOAL #7   Title  Patient will perform 10 MWT in >1.0 m/s for improved mobility and safety with negotiating natural environment    Baseline  9/17: .53 m/s 10/10: .63 m/s with RW 11/5: 0.41ms; 12/11/17: 15.33s = 0.65 m/s with spc; 01/22/18: self-selected: 14.4s = 0.69 m/s, fastest: 11.8s = 0.85 m/s; 02/26/18: self-selected: 13.3s = 0.75 m/s, fastest: 10.6s = 0.94 m/s    Time  4    Period  Weeks    Status  Partially Met    Target Date  04/04/18      PT LONG TERM GOAL #8   Title  Patient will ambulate 300 ft with least assistive AD and prosthesis to improve mobility around home and increase independence.     Baseline  9/17: 74 ft CGA with RW 10/10 ambulated 280 ft with RW, [facility]: ambulate [id number] 10/31; 01/22/18: 400', 02/26/18: 500'    Time  4    Period  Weeks    Status  Achieved      PT LONG TERM GOAL  #9  TITLE  Patient will ascend/descend 4 stairs with rail assist independently without loss of balance to improve ability to get in/out of home.     Baseline  9/17: unable to negotiate stairs 10/10: ascend with step to pattern, BUE support, and CGA 11/5: patient demonstrates ability to perform CGA and BUE support    Time  4    Period  Weeks    Status  Achieved      PT LONG TERM GOAL  #10   TITLE  Patient will ascend/descend 4 stairs with rail assist independently with reciprocating pattern without loss of balance to improve ability to get in/out of home.     Baseline  11/5: step to pattern BUE support CGA; 12/11/17: BUE heavy support and reciprocal pattern;    Time  4    Period  Weeks    Status  Achieved      PT LONG TERM GOAL  #11   TITLE  Patient will ambulate >1019f during 632m with LRAD to demonstrate improved community ambulation and functional mobility     Baseline  11/5: will complete next session; 01/22/18: 400' in 3 minutes but had to stop secondary  to pain in knee; 01/22/18: 400' in 3 minutes limited by R knee pain, 02/26/18: 500' in 3 minutes limited by low back pain    Time  4    Period  Weeks    Status  Partially Met    Target Date  04/04/18            Plan - 02/28/18 1040    Clinical Impression Statement  Pt is able to progress to some additional standing exercises on this date as well as return to the leg press. His stability with gait has improved while performing head turns, full turns, gait speed changes, and quick stops. He will need an updated progress note at next visit. Patientencouraged to continue HEP and follow-up as scheduled. Hewill continue to benefit from skilled physical therapy to improve functional mobility, improve prosthetic use, and increase ambulation to increase LOF and improve overall QOL.    Rehab Potential  Fair    Clinical Impairments Affecting Rehab Potential  (+) previous independence, motivation to return to walking (-) lives alone, hx of diabetes, limited vision     PT Frequency  2x / week    PT Duration  8 weeks    PT Treatment/Interventions  ADLs/Self Care Home Management;Cryotherapy;Electrical Stimulation;Ultrasound;Traction;Moist Heat;DME Instruction;Gait training;Stair training;Functional mobility training;Therapeutic activities;Therapeutic exercise;Patient/family education;Neuromuscular re-education;Balance training;Prosthetic Training;Wheelchair mobility training;Manual techniques;Manual lymph drainage;Compression bandaging;Taping;Energy conservation;Passive range of motion    PT Next Visit Plan  Progress note, progress standing exercises once prothesis is adjusted, gait training, strengthening, balance    PT Home Exercise Plan  see [id number] on medbridge, spc ambulation    Consulted and Agree with Plan of Care  Patient       Patient will benefit from skilled therapeutic intervention in order to improve the following deficits and impairments:  Abnormal gait, Decreased activity tolerance,  Decreased balance, Decreased knowledge of precautions, Decreased endurance, Decreased knowledge of use of DME, Decreased mobility, Decreased range of motion, Difficulty walking, Decreased strength, Increased edema, Impaired flexibility, Impaired perceived functional ability, Prosthetic Dependency, Postural dysfunction, Improper body mechanics, Pain  Visit Diagnosis: Muscle weakness (generalized)  Unsteadiness on feet  Other abnormalities of gait and mobility     Problem List Patient Active Problem List   Diagnosis Date Noted  . Abnormality of gait 10/12/2017  . Poorly controlled type 2 diabetes  mellitus with peripheral neuropathy ([place])   . Flatulence   . Hypomagnesemia   . Unilateral complete BKA, right, sequela ([place])   . Benign essential HTN   . Hypoalbuminemia due to protein-calorie malnutrition ([place])   . S/P below knee amputation, right ([place]) 04/12/2017  . Acute blood loss anemia   . Other encephalopathy 04/08/2017  . Encephalopathy 04/08/2017  . Altered mental status 04/08/2017  . Labile blood pressure   . Labile blood glucose   . Drug induced constipation   . Stage 3 chronic kidney disease ([place])   . Bacteremia   . S/P unilateral BKA (below knee amputation), right ([place]) 04/04/2017  . PAD (peripheral artery disease) ([place])   . Type 2 diabetes mellitus with right diabetic foot ulcer ([place])   . Post-operative pain   . Legally blind   . Upper GI bleed   . Streptococcal bacteremia 04/01/2017  . Hypokalemia 03/30/2017  . Uncontrolled type 2 diabetes mellitus with hyperglycemia, with long-term current use of insulin ([place]) 03/30/2017  . Type 2 diabetes mellitus with peripheral neuropathy ([place]) 03/30/2017  . AKI (acute kidney injury) ([place]) 03/30/2017  . CKD stage 3 due to type 2 diabetes mellitus ([place]) 03/30/2017  . Sepsis ([place]) 03/30/2017  . Heart murmur 11/22/2016  . Hyperlipidemia associated with type 2 diabetes mellitus ([place]) 11/22/2016  . Obstructive sleep apnea syndrome  11/22/2016  . Essential hypertension 12/17/2012   Phillips Grout PT, DPT, GCS  Huprich,Jason 02/28/2018, 4:46 PM  [place] MAIN [facility] SERVICES [address] [place], [place], [address] Phone: [phone]   Fax:  [phone]  Name: Trevor [name] MRN: [id number] Date of Birth: 04/22/1955

## 2018-03-05 ENCOUNTER — Ambulatory Visit: Payer: Medicare Other

## 2018-03-05 VITALS — BP 148/71 | HR 63

## 2018-03-05 DIAGNOSIS — M6281 Muscle weakness (generalized): Secondary | ICD-10-CM

## 2018-03-05 DIAGNOSIS — R2681 Unsteadiness on feet: Secondary | ICD-10-CM

## 2018-03-05 NOTE — Therapy (Signed)
[place] MAIN [facility] SERVICES [place], [place], [address] Phone: [phone]   Fax:  [phone]  Physical Therapy Progress Note   Dates of reporting period  01/24/18   to   03/05/18  Patient Details  Name: Trevor [name] MRN: [id number] Date of Birth: 11-Mar-1955 Referring Provider (PT): Jamse Arn, DR   Encounter Date: 03/05/2018  PT End of Session - 03/05/18 1408    Visit Number  40    Number of Visits  34    Date for PT Re-Evaluation  04/04/18    Authorization Type  Eval 08/23/17; Last goals 02/26/18;    PT Start Time  1125    PT Stop Time  1205    PT Time Calculation (min)  40 min    Equipment Utilized During Treatment  Gait belt;Other (comment)   R prosthesis   Activity Tolerance  Patient tolerated treatment well    Behavior During Therapy  WFL for tasks assessed/performed       Past Medical History:  Diagnosis Date  . Diabetes mellitus without complication ([place])   . Heart murmur   . Hyperlipidemia   . Hypertension   . Sleep apnea     Past Surgical History:  Procedure Laterality Date  . AMPUTATION Right 03/31/2017   Procedure: RIGHT FOOT 1ST AND 2ND RAY AMPUTATION;  Surgeon: Newt Minion, MD;  Location: [place];  Service: Orthopedics;  Laterality: Right;  . AMPUTATION Right 04/01/2017   Procedure: AMPUTATION BELOW KNEE;  Surgeon: Newt Minion, MD;  Location: [facility];  Service: Orthopedics;  Laterality: Right;  . COLONOSCOPY WITH PROPOFOL N/A 10/02/2017   Procedure: COLONOSCOPY WITH PROPOFOL;  Surgeon: [name], MD;  Location: [facility];  Service: Gastroenterology;  Laterality: N/A;  . CORNEAL TRANSPLANT      Vitals:   03/05/18 1141  BP: (!) 148/71  Pulse: 63  SpO2: 99%    Subjective Assessment - 03/05/18 1352    Subjective  Pt reports that he is doing well on this date. He denies any knee pain at this time. He has continued to have some phantom R toe pain. No specific questions or concerns  currently. He sees his MD tomorrow and reports that he wants to request a prescription for another socket for his prosthesis.     Pertinent History  63 year old right-handed male with history of diabetes mellitus, hypertension, legally blind, and CKD presenting for BKA 04/01/17. Patient wears contacts and can see better now.  Patient had two surgeries, one was a couple toes then they decided to go below the knee. 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 Has ambulated with RW in hospital with PT. Patient has had home health therapy and was in inpatient rehab about a month. Patient reports he was mainly sitting down, with occasional walking, last home health therapy in May. Patient reports some standing up but no exercises. Patient lives alone in the house and is scared they might fall. Patient received prosthetic leg Tuesday of last week.    Limitations  Standing;Walking;House hold activities;Other (comment)    Currently in Pain?  No/denies          TREATMENT   Therapeutic Exercise: Sit to stand without UE support from elevated mat table with 5" step under her LLE x 10; Alternating toe taps to 5" step without UE support x 10 each; Mini squats  in // bars with UE support x 10; Seated clams with manual resistance x 10; Seated adductor squeeze with manual resistance x 10; Pt instructed in standing R hip flexor stretch with RLE on table 30s hold, added to HEP;   Gait Training Gait trainingin [id number]' withspc. Performed horizontal and vertical head turns, quick stops, and gait speed changes. No seated rest breaks required. No R knee or residual limb pain during ambulation today;   Pt educated throughout session about proper posture and technique with exercises. Improved exercise technique, movement at target joints, use of target muscles after min to mod verbal, visual, tactile cues.    Pt demonstrating  improvement in all of his outcome measures when they were last checked. His 5TSTS decreased from 17.1s to 13.0s however he is still unable to perform without UE support. His TUG decreased from 14.8s to 13.0s and his fastest 23mgait speed improved from 0.85 m/s to 0.94 m/s. His LEFS increased from 27/80 to 34/80. Attempted 6MWT however pt again had to stop after 3 minutes but this time due to 8/10 low back pain. Over the course of 3 minutes his distance increased from 400' on 01/22/18 to 500' at last check.  He is making excellent progress towards reaching his goals.Patientencouraged to continue HEP and follow-up as scheduled. He arrived late for his session today so it was abbreviated accordingly. He continues to demonstrate improved stability with head turns and gait speed changes during ambulation. Hewill continue to benefit from skilled physical therapy to improve functional mobility, improve prosthetic use, and increase ambulation to increase LOF and improve overall QOL.                           PT Short Term Goals - 02/26/18 1513      PT SHORT TERM GOAL #1   Title  Patient will be independent in home exercise program to improve strength/mobility for better functional independence with ADLs.    Baseline  HEP given, 12/11/17: Performing daily:     Time  2    Period  Weeks    Status  Achieved      PT SHORT TERM GOAL #2   Title  Patient will don/doff prosthesis independently to allow for increased mobility in home.     Baseline  8/14: requires PT to guide/direct through process 8/28: independent    Time  2    Period  Weeks    Status  Achieved      PT SHORT TERM GOAL #3   Title  Patient will ambulate 10 meters with least assistive device and prosthesis to improve mobility in home.     Baseline  8/14: not walking outside of // bars yet  8/28: 63 seconds with RW and CGA     Time  2    Period  Weeks    Status  Achieved      PT SHORT TERM GOAL #4   Title  Patient  will perform STS with single UE support to decrease reliance upon UE's for stability     Baseline  patient requires BUE support 11/5: 1 UR support    Time  2    Period  Weeks    Status  Achieved        PT Long Term Goals - 02/26/18 1513      PT LONG TERM GOAL #1   Title  Patient will ambulate 60 ft with least assistive AD and prosthesis  to improve mobility around home and increase independence.     Baseline  8/14: requires use of // bars 8/28: 30 ft with RW and CGA 9/17: 74 ft x2 with CGA and RW     Time  4    Period  Weeks    Status  Achieved      PT LONG TERM GOAL #2   Title  Patient (> 29 years old) will complete five times sit to stand test in < 15 seconds indicating an increased LE strength and improved balance.    Baseline  8/14: 61 seconds 8/28; 42 seconds 9/17: 24 seconds BUE support with LLE 10/10: 17 seconds with excessive BUE support  11/5: 18seconds BUE support; 12/11/17: 12/11/17: 18.5s with BUE support but no AD, 01/22/18: 17.1s BUE support but no AD; 02/26/18: 13.0s with BUE, still unable to perform without UE assistance    Time  4    Period  Weeks    Status  Partially Met    Target Date  04/04/18      PT LONG TERM GOAL #3   Title  Patient will reduce timed up and go to <11 seconds to reduce fall risk and demonstrate improved transfer/gait ability.    Baseline  8/14: unable to perform 8/28: 1 min 30 seconds 9/17: 38 seconds RW 10/10: 25 seconds with RW 11/5: 20 seconds with cane;, 12/11/17: 17.4s with spc; 01/22/18: 14.8s with spc; 02/26/18: 13.0s with spc    Time  4    Period  Weeks    Status  On-going    Target Date  04/04/18      PT LONG TERM GOAL #4   Title  Patient will increase lower extremity functional scale to >60/80 to demonstrate improved functional mobility and increased tolerance with ADLs.     Baseline  814: 29/80 8/28: 20/80 10/10: 20/80 11/5: 22/80; 12/11/17: 18/80; 01/22/18: 27/80    Time  4    Period  Weeks    Status  On-going    Target Date  04/04/18       PT LONG TERM GOAL #5   Title  Patient will increase BLE gross strength to 4+/5 as to improve functional strength for independent gait, increased standing tolerance and increased ADL ability.    Baseline  8/14: 4-/15 8/28: 4-/5  9/17: L 4-/5 R 4/5      Time  4    Period  Weeks    Status  Achieved      PT LONG TERM GOAL #6   Title  Patient will perform 10 MWT in >.5 m/s for improved mobility and safety with negotiating natural environment    Baseline  8/28: .16 m/s with RW  9/17: .53 m/s;     Time  4    Period  Weeks    Status  Achieved      PT LONG TERM GOAL #7   Title  Patient will perform 10 MWT in >1.0 m/s for improved mobility and safety with negotiating natural environment    Baseline  9/17: .53 m/s 10/10: .63 m/s with RW 11/5: 0.12ms; 12/11/17: 15.33s = 0.65 m/s with spc; 01/22/18: self-selected: 14.4s = 0.69 m/s, fastest: 11.8s = 0.85 m/s; 02/26/18: self-selected: 13.3s = 0.75 m/s, fastest: 10.6s = 0.94 m/s    Time  4    Period  Weeks    Status  Partially Met    Target Date  04/04/18      PT LONG TERM GOAL #8  Title  Patient will ambulate 300 ft with least assistive AD and prosthesis to improve mobility around home and increase independence.     Baseline  9/17: 74 ft CGA with RW 10/10 ambulated 280 ft with RW, 70 ft with [facility] 11/5: ambulate 450f on 10/31; 01/22/18: 400', 02/26/18: 500'    Time  4    Period  Weeks    Status  Achieved      PT LONG TERM GOAL  #9   TITLE  Patient will ascend/descend 4 stairs with rail assist independently without loss of balance to improve ability to get in/out of home.     Baseline  9/17: unable to negotiate stairs 10/10: ascend with step to pattern, BUE support, and CGA 11/5: patient demonstrates ability to perform CGA and BUE support    Time  4    Period  Weeks    Status  Achieved      PT LONG TERM GOAL  #10   TITLE  Patient will ascend/descend 4 stairs with rail assist independently with reciprocating pattern without loss of balance to  improve ability to get in/out of home.     Baseline  11/5: step to pattern BUE support CGA; 12/11/17: BUE heavy support and reciprocal pattern;    Time  4    Period  Weeks    Status  Achieved      PT LONG TERM GOAL  #11   TITLE  Patient will ambulate >10029fduring 24m47mwith LRAD to demonstrate improved community ambulation and functional mobility     Baseline  11/5: will complete next session; 01/22/18: 400' in 3 minutes but had to stop secondary to pain in knee; 01/22/18: 400' in 3 minutes limited by R knee pain, 02/26/18: 500' in 3 minutes limited by low back pain    Time  4    Period  Weeks    Status  Partially Met    Target Date  04/04/18            Plan - 03/05/18 1408    Clinical Impression Statement  Pt demonstrating improvement in all of his outcome measures when they were last checked. His 5TSTS decreased from 17.1s to 13.0s however he is still unable to perform without UE support. His TUG decreased from 14.8s to 13.0s and his fastest 40m65mt speed improved from 0.85 m/s to 0.94 m/s. His LEFS increased from 27/80 to 34/80. Attempted 6MWT however pt again had to stop after 3 minutes but this time due to 8/10 low back pain. Over the course of 3 minutes his distance increased from 400' on 01/22/18 to 500' at last check.  He is making excellent progress towards reaching his goals.Patientencouraged to continue HEP and follow-up as scheduled. He arrived late for his session today so it was abbreviated accordingly. He continues to demonstrate improved stability with head turns and gait speed changes during ambulation. Hewill continue to benefit from skilled physical therapy to improve functional mobility, improve prosthetic use, and increase ambulation to increase LOF and improve overall QOL.    Rehab Potential  Fair    Clinical Impairments Affecting Rehab Potential  (+) previous independence, motivation to return to walking (-) lives alone, hx of diabetes, limited vision     PT Frequency   2x / week    PT Duration  8 weeks    PT Treatment/Interventions  ADLs/Self Care Home Management;Cryotherapy;Electrical Stimulation;Ultrasound;Traction;Moist Heat;DME Instruction;Gait training;Stair training;Functional mobility training;Therapeutic activities;Therapeutic exercise;Patient/family education;Neuromuscular re-education;Balance training;Prosthetic Training;Wheelchair mobility training;Manual techniques;Manual lymph  drainage;Compression bandaging;Taping;Energy conservation;Passive range of motion    PT Next Visit Plan  Progress standing exercises once prothesis is adjusted, gait training, strengthening, balance    PT Home Exercise Plan  see [id number] on medbridge, spc ambulation    Consulted and Agree with Plan of Care  Patient       Patient will benefit from skilled therapeutic intervention in order to improve the following deficits and impairments:  Abnormal gait, Decreased activity tolerance, Decreased balance, Decreased knowledge of precautions, Decreased endurance, Decreased knowledge of use of DME, Decreased mobility, Decreased range of motion, Difficulty walking, Decreased strength, Increased edema, Impaired flexibility, Impaired perceived functional ability, Prosthetic Dependency, Postural dysfunction, Improper body mechanics, Pain  Visit Diagnosis: Muscle weakness (generalized)  Unsteadiness on feet     Problem List Patient Active Problem List   Diagnosis Date Noted  . Abnormality of gait 10/12/2017  . Poorly controlled type 2 diabetes mellitus with peripheral neuropathy ([place])   . Flatulence   . Hypomagnesemia   . Unilateral complete BKA, right, sequela ([place])   . Benign essential HTN   . Hypoalbuminemia due to protein-calorie malnutrition ([place])   . S/P below knee amputation, right ([place]) 04/12/2017  . Acute blood loss anemia   . Other encephalopathy 04/08/2017  . Encephalopathy 04/08/2017  . Altered mental status 04/08/2017  . Labile blood pressure   . Labile blood  glucose   . Drug induced constipation   . Stage 3 chronic kidney disease ([place])   . Bacteremia   . S/P unilateral BKA (below knee amputation), right ([place]) 04/04/2017  . PAD (peripheral artery disease) ([place])   . Type 2 diabetes mellitus with right diabetic foot ulcer ([place])   . Post-operative pain   . Legally blind   . Upper GI bleed   . Streptococcal bacteremia 04/01/2017  . Hypokalemia 03/30/2017  . Uncontrolled type 2 diabetes mellitus with hyperglycemia, with long-term current use of insulin ([place]) 03/30/2017  . Type 2 diabetes mellitus with peripheral neuropathy ([place]) 03/30/2017  . AKI (acute kidney injury) ([place]) 03/30/2017  . CKD stage 3 due to type 2 diabetes mellitus ([place]) 03/30/2017  . Sepsis ([place]) 03/30/2017  . Heart murmur 11/22/2016  . Hyperlipidemia associated with type 2 diabetes mellitus ([place]) 11/22/2016  . Obstructive sleep apnea syndrome 11/22/2016  . Essential hypertension 12/17/2012   Phillips Grout PT, DPT, GCS  Kenyen Candy 03/05/2018, 4:18 PM  [place] MAIN [facility] SERVICES [address] [place], [place], [address] Phone: [phone]   Fax:  [phone]  Name: ZYHEIR DAFT MRN: [id number] Date of Birth: [birth year]

## 2018-03-05 NOTE — Patient Instructions (Signed)
Access Code: [email or web address]  URL: [email or web address]  Date: 03/05/2018  Prepared by: Roxana Hires   Exercises  Standing Hip Flexor Stretch on Chair - 3 reps - 60 seconds hold - 3x daily - 7x weekly

## 2018-03-07 ENCOUNTER — Ambulatory Visit: Payer: Medicare Other

## 2018-03-07 VITALS — BP 140/72 | HR 63

## 2018-03-07 DIAGNOSIS — R2681 Unsteadiness on feet: Secondary | ICD-10-CM

## 2018-03-07 DIAGNOSIS — R2689 Other abnormalities of gait and mobility: Secondary | ICD-10-CM

## 2018-03-07 DIAGNOSIS — M6281 Muscle weakness (generalized): Secondary | ICD-10-CM | POA: Diagnosis not present

## 2018-03-07 NOTE — Therapy (Signed)
[place] MAIN [facility] SERVICES [address] [place], [place], [address] Phone: [phone]   Fax:  [phone]  Physical Therapy Treatment  Patient Details  Name: Trevor [name] MRN: [id number] Date of Birth: 11-10-1955 Referring Provider (PT): Jamse Arn, DR   Encounter Date: 03/07/2018  PT End of Session - 03/07/18 1127    Visit Number  41    Number of Visits  79    Date for PT Re-Evaluation  04/04/18    Authorization Type  Eval 08/23/17; Last goals 02/26/18;    PT Start Time  1119    PT Stop Time  1202    PT Time Calculation (min)  43 min    Equipment Utilized During Treatment  Gait belt;Other (comment)   R prosthesis   Activity Tolerance  Patient tolerated treatment well    Behavior During Therapy  WFL for tasks assessed/performed       Past Medical History:  Diagnosis Date  . Diabetes mellitus without complication ([place])   . Heart murmur   . Hyperlipidemia   . Hypertension   . Sleep apnea     Past Surgical History:  Procedure Laterality Date  . AMPUTATION Right 03/31/2017   Procedure: RIGHT FOOT 1ST AND 2ND RAY AMPUTATION;  Surgeon: Newt Minion, MD;  Location: [facility];  Service: Orthopedics;  Laterality: Right;  . AMPUTATION Right 04/01/2017   Procedure: AMPUTATION BELOW KNEE;  Surgeon: Newt Minion, MD;  Location: [facility];  Service: Orthopedics;  Laterality: Right;  . COLONOSCOPY WITH PROPOFOL N/A 10/02/2017   Procedure: COLONOSCOPY WITH PROPOFOL;  Surgeon: [name], MD;  Location: [facility] ENDOSCOPY;  Service: Gastroenterology;  Laterality: N/A;  . CORNEAL TRANSPLANT      Vitals:   03/07/18 1123  BP: 140/72  Pulse: 63  SpO2: 99%    Subjective Assessment - 03/07/18 1122    Subjective  Pt reports that he is doing well on this date. He denies any knee pain at this time. No phantom toe pain since the last visit. No specific questions or concerns currently. He saw his MD yesterday who gave him an order for a new  prosthetic socket. No specific questions or concerns currently.    Pertinent History  63 year old right-handed male with history of diabetes mellitus, hypertension, legally blind, and CKD presenting for BKA 04/01/17. Patient wears contacts and can see better now.  Patient had two surgeries, one was a couple toes then they decided to go below the knee. 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 Has ambulated with RW in hospital with PT. Patient has had home health therapy and was in inpatient rehab about a month. Patient reports he was mainly sitting down, with occasional walking, last home health therapy in May. Patient reports some standing up but no exercises. Patient lives alone in the house and is scared they might fall. Patient received prosthetic leg Tuesday of last week.    Limitations  Standing;Walking;House hold activities;Other (comment)    Currently in Pain?  No/denies         TREATMENT   Therapeutic Exercise: Sit to stand without UE supportfrom elevated mat table with 5" step under her LLE x 10; Quantum R single leg press 105# x 15, x 20, pt started having phantom calf pain after the first set but not after the second. For testing purposes pt maxes out LLE around 195#;  Mini squats in // bars with UE support x 10; Sit to stand from regular height chair with Airex pad on seat without UE support x 5; Standing SLR hip flexion, abduction, and extension with 2# ankle weight x 15 each bilateral; Alternating toe taps to5" step without UE support x 10 each;   Gait Training Gait training on treadmill x 2.5 minutes with biofeedback and cues from therpaist for step length. Pt requires one standing rest breaks. He becomes fatigued after two and a half minutes and has to stop.    Pt educated throughout session about proper posture and technique with exercises. Improved exercise technique, movement at target joints, use  of target muscles after min to mod verbal, visual, tactile cues.   Pt is able to progress to some additional standing exercises on this date as well as return to the leg press. He continues to struggle with cardiovascular fatigue as well as back pain during gait. Patientencouraged to continue HEP and follow-up as scheduled. Hewill continue to benefit from skilled physical therapy to improve functional mobility, improve prosthetic use, and increase ambulation to increase LOF and improve overall QOL.                        PT Short Term Goals - 02/26/18 1513      PT SHORT TERM GOAL #1   Title  Patient will be independent in home exercise program to improve strength/mobility for better functional independence with ADLs.    Baseline  HEP given, 12/11/17: Performing daily:     Time  2    Period  Weeks    Status  Achieved      PT SHORT TERM GOAL #2   Title  Patient will don/doff prosthesis independently to allow for increased mobility in home.     Baseline  8/14: requires PT to guide/direct through process 8/28: independent    Time  2    Period  Weeks    Status  Achieved      PT SHORT TERM GOAL #3   Title  Patient will ambulate 10 meters with least assistive device and prosthesis to improve mobility in home.     Baseline  8/14: not walking outside of // bars yet  8/28: 63 seconds with RW and CGA     Time  2    Period  Weeks    Status  Achieved      PT SHORT TERM GOAL #4   Title  Patient will perform STS with single UE support to decrease reliance upon UE's for stability     Baseline  patient requires BUE support 11/5: 1 UR support    Time  2    Period  Weeks    Status  Achieved        PT Long Term Goals - 02/26/18 1513      PT LONG TERM GOAL #1   Title  Patient will ambulate 60 ft with least assistive AD and prosthesis to improve mobility around home and increase independence.     Baseline  8/14: requires use of // bars 8/28: 30 ft with RW and CGA 9/17:  74 ft x2 with CGA and RW     Time  4    Period  Weeks    Status  Achieved      PT LONG TERM GOAL #2   Title  Patient (> 13 years old) will complete five times sit to stand test in <  15 seconds indicating an increased LE strength and improved balance.    Baseline  8/14: 61 seconds 8/28; 42 seconds 9/17: 24 seconds BUE support with LLE 10/10: 17 seconds with excessive BUE support  11/5: 18seconds BUE support; 12/11/17: 12/11/17: 18.5s with BUE support but no AD, 01/22/18: 17.1s BUE support but no AD; 02/26/18: 13.0s with BUE, still unable to perform without UE assistance    Time  4    Period  Weeks    Status  Partially Met    Target Date  04/04/18      PT LONG TERM GOAL #3   Title  Patient will reduce timed up and go to <11 seconds to reduce fall risk and demonstrate improved transfer/gait ability.    Baseline  8/14: unable to perform 8/28: 1 min 30 seconds 9/17: 38 seconds RW 10/10: 25 seconds with RW 11/5: 20 seconds with cane;, 12/11/17: 17.4s with spc; 01/22/18: 14.8s with spc; 02/26/18: 13.0s with spc    Time  4    Period  Weeks    Status  On-going    Target Date  04/04/18      PT LONG TERM GOAL #4   Title  Patient will increase lower extremity functional scale to >60/80 to demonstrate improved functional mobility and increased tolerance with ADLs.     Baseline  814: 29/80 8/28: 20/80 10/10: 20/80 11/5: 22/80; 12/11/17: 18/80; 01/22/18: 27/80    Time  4    Period  Weeks    Status  On-going    Target Date  04/04/18      PT LONG TERM GOAL #5   Title  Patient will increase BLE gross strength to 4+/5 as to improve functional strength for independent gait, increased standing tolerance and increased ADL ability.    Baseline  8/14: 4-/15 8/28: 4-/5  9/17: L 4-/5 R 4/5      Time  4    Period  Weeks    Status  Achieved      PT LONG TERM GOAL #6   Title  Patient will perform 10 MWT in >.5 m/s for improved mobility and safety with negotiating natural environment    Baseline  8/28: .16 m/s with  RW  9/17: .53 m/s;     Time  4    Period  Weeks    Status  Achieved      PT LONG TERM GOAL #7   Title  Patient will perform 10 MWT in >1.0 m/s for improved mobility and safety with negotiating natural environment    Baseline  9/17: .53 m/s 10/10: .63 m/s with RW 11/5: 0.22ms; 12/11/17: 15.33s = 0.65 m/s with spc; 01/22/18: self-selected: 14.4s = 0.69 m/s, fastest: 11.8s = 0.85 m/s; 02/26/18: self-selected: 13.3s = 0.75 m/s, fastest: 10.6s = 0.94 m/s    Time  4    Period  Weeks    Status  Partially Met    Target Date  04/04/18      PT LONG TERM GOAL #8   Title  Patient will ambulate 300 ft with least assistive AD and prosthesis to improve mobility around home and increase independence.     Baseline  9/17: 74 ft CGA with RW 10/10 ambulated 280 ft with RW, [facility]: ambulate [id number] 10/31; 01/22/18: 400', 02/26/18: 500'    Time  4    Period  Weeks    Status  Achieved      PT LONG TERM GOAL  #9  TITLE  Patient will ascend/descend 4 stairs with rail assist independently without loss of balance to improve ability to get in/out of home.     Baseline  9/17: unable to negotiate stairs 10/10: ascend with step to pattern, BUE support, and CGA 11/5: patient demonstrates ability to perform CGA and BUE support    Time  4    Period  Weeks    Status  Achieved      PT LONG TERM GOAL  #10   TITLE  Patient will ascend/descend 4 stairs with rail assist independently with reciprocating pattern without loss of balance to improve ability to get in/out of home.     Baseline  11/5: step to pattern BUE support CGA; 12/11/17: BUE heavy support and reciprocal pattern;    Time  4    Period  Weeks    Status  Achieved      PT LONG TERM GOAL  #11   TITLE  Patient will ambulate >1035f during 657m with LRAD to demonstrate improved community ambulation and functional mobility     Baseline  11/5: will complete next session; 01/22/18: 400' in 3 minutes but had to stop secondary to pain in knee; 01/22/18: 400'  in 3 minutes limited by R knee pain, 02/26/18: 500' in 3 minutes limited by low back pain    Time  4    Period  Weeks    Status  Partially Met    Target Date  04/04/18            Plan - 03/07/18 1130    Clinical Impression Statement  Pt is able to progress to some additional standing exercises on this date as well as return to the leg press. He continues to struggle with cardiovascular fatigue as well as back pain during gait. Patientencouraged to continue HEP and follow-up as scheduled. Hewill continue to benefit from skilled physical therapy to improve functional mobility, improve prosthetic use, and increase ambulation to increase LOF and improve overall QOL.    Rehab Potential  Fair    Clinical Impairments Affecting Rehab Potential  (+) previous independence, motivation to return to walking (-) lives alone, hx of diabetes, limited vision     PT Frequency  2x / week    PT Duration  8 weeks    PT Treatment/Interventions  ADLs/Self Care Home Management;Cryotherapy;Electrical Stimulation;Ultrasound;Traction;Moist Heat;DME Instruction;Gait training;Stair training;Functional mobility training;Therapeutic activities;Therapeutic exercise;Patient/family education;Neuromuscular re-education;Balance training;Prosthetic Training;Wheelchair mobility training;Manual techniques;Manual lymph drainage;Compression bandaging;Taping;Energy conservation;Passive range of motion    PT Next Visit Plan  Progress standing exercises, gait training, strengthening, balance    PT Home Exercise Plan  see [id number] on medbridge, spc ambulation    Consulted and Agree with Plan of Care  Patient       Patient will benefit from skilled therapeutic intervention in order to improve the following deficits and impairments:  Abnormal gait, Decreased activity tolerance, Decreased balance, Decreased knowledge of precautions, Decreased endurance, Decreased knowledge of use of DME, Decreased mobility, Decreased range of motion,  Difficulty walking, Decreased strength, Increased edema, Impaired flexibility, Impaired perceived functional ability, Prosthetic Dependency, Postural dysfunction, Improper body mechanics, Pain  Visit Diagnosis: Muscle weakness (generalized)  Unsteadiness on feet  Other abnormalities of gait and mobility     Problem List Patient Active Problem List   Diagnosis Date Noted  . Abnormality of gait 10/12/2017  . Poorly controlled type 2 diabetes mellitus with peripheral neuropathy ([place]  . Flatulence   . Hypomagnesemia   . Unilateral complete BKA, right,  sequela ([place])   . Benign essential HTN   . Hypoalbuminemia due to protein-calorie malnutrition ([place])   . S/P below knee amputation, right ([place]) 04/12/2017  . Acute blood loss anemia   . Other encephalopathy 04/08/2017  . Encephalopathy 04/08/2017  . Altered mental status 04/08/2017  . Labile blood pressure   . Labile blood glucose   . Drug induced constipation   . Stage 3 chronic kidney disease ([place])   . Bacteremia   . S/P unilateral BKA (below knee amputation), right ([place]) 04/04/2017  . PAD (peripheral artery disease) ([place])   . Type 2 diabetes mellitus with right diabetic foot ulcer ([place])   . Post-operative pain   . Legally blind   . Upper GI bleed   . Streptococcal bacteremia 04/01/2017  . Hypokalemia 03/30/2017  . Uncontrolled type 2 diabetes mellitus with hyperglycemia, with long-term current use of insulin ([place]) 03/30/2017  . Type 2 diabetes mellitus with peripheral neuropathy ([place]) 03/30/2017  . AKI (acute kidney injury) ([place]) 03/30/2017  . CKD stage 3 due to type 2 diabetes mellitus ([place]) 03/30/2017  . Sepsis ([place]) 03/30/2017  . Heart murmur 11/22/2016  . Hyperlipidemia associated with type 2 diabetes mellitus ([place]) 11/22/2016  . Obstructive sleep apnea syndrome 11/22/2016  . Essential hypertension 12/17/2012   Phillips Grout PT, DPT, GCS  , 03/08/2018, 2:41 PM  [place] MAIN [facility] SERVICES [address] [place], [place], [address] Phone: [phone]   Fax:  [phone]  Name: Trevor [name] MRN: [id number] Date of Birth: 06/25/1955

## 2018-03-12 ENCOUNTER — Ambulatory Visit: Payer: Medicare Other | Attending: Physical Medicine & Rehabilitation

## 2018-03-12 VITALS — BP 126/70 | HR 64

## 2018-03-12 DIAGNOSIS — M6281 Muscle weakness (generalized): Secondary | ICD-10-CM | POA: Insufficient documentation

## 2018-03-12 DIAGNOSIS — R5383 Other fatigue: Secondary | ICD-10-CM | POA: Insufficient documentation

## 2018-03-12 DIAGNOSIS — R2689 Other abnormalities of gait and mobility: Secondary | ICD-10-CM | POA: Insufficient documentation

## 2018-03-12 DIAGNOSIS — R2681 Unsteadiness on feet: Secondary | ICD-10-CM | POA: Insufficient documentation

## 2018-03-12 DIAGNOSIS — S88111S Complete traumatic amputation at level between knee and ankle, right lower leg, sequela: Secondary | ICD-10-CM | POA: Diagnosis present

## 2018-03-12 NOTE — Therapy (Signed)
[place] [facility] SERVICES [address] [place], [place], [address] Phone: [phone]   Fax:  [phone]  Physical Therapy Treatment  Patient Details  Name: Trevor [name] MRN: [id number] Date of Birth: 02-08-1955 Referring Provider (PT): Jamse Arn, DR   Encounter Date: 03/12/2018  PT End of Session - 03/12/18 1125    Visit Number  42    Number of Visits  80    Date for PT Re-Evaluation  04/04/18    Authorization Type  Eval 08/23/17; Last goals 02/26/18;    PT Start Time  1118    PT Stop Time  1201    PT Time Calculation (min)  43 min    Equipment Utilized During Treatment  Gait belt;Other (comment)   R prosthesis   Activity Tolerance  Patient tolerated treatment well    Behavior During Therapy  WFL for tasks assessed/performed       Past Medical History:  Diagnosis Date  . Diabetes mellitus without complication ([place])   . Heart murmur   . Hyperlipidemia   . Hypertension   . Sleep apnea     Past Surgical History:  Procedure Laterality Date  . AMPUTATION Right 03/31/2017   Procedure: RIGHT FOOT 1ST AND 2ND RAY AMPUTATION;  Surgeon: Newt Minion, MD;  Location: [place];  Service: Orthopedics;  Laterality: Right;  . AMPUTATION Right 04/01/2017   Procedure: AMPUTATION BELOW KNEE;  Surgeon: Newt Minion, MD;  Location: [place];  Service: Orthopedics;  Laterality: Right;  . COLONOSCOPY WITH PROPOFOL N/A 10/02/2017   Procedure: COLONOSCOPY WITH PROPOFOL;  Surgeon: [name], MD;  Location: [facility];  Service: Gastroenterology;  Laterality: N/A;  . CORNEAL TRANSPLANT      Vitals:   03/12/18 1121  BP: 126/70  Pulse: 64  SpO2: 100%    Subjective Assessment - 03/12/18 1124    Subjective  Pt reports that he is doing well on this date. He denies any knee pain at this time but did have some R knee pain over the weekend. No recent stumbles or falls. No phantom toe pain over the weekend. No specific questions or concerns  currently.    Pertinent History  63 year old right-handed male with history of diabetes mellitus, hypertension, legally blind, and CKD presenting for BKA 04/01/17. Patient wears contacts and can see better now.  Patient had two surgeries, one was a couple toes then they decided to go below the knee. 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 Has ambulated with RW in hospital with PT. Patient has had home health therapy and was in inpatient rehab about a month. Patient reports he was mainly sitting down, with occasional walking, last home health therapy in May. Patient reports some standing up but no exercises. Patient lives alone in the house and is scared they might fall. Patient received prosthetic leg Tuesday of last week.    Limitations  Standing;Walking;House hold activities;Other (comment)    How long can you stand comfortably?  10-15 minutes    How long can you walk comfortably?  10-15 minutes    Currently in Pain?  No/denies           TREATMENT   Therapeutic Exercise: Octane warm-up L3-4 with therapist monitoring for fatigue and adjusting accordingly. History obtained. 5 minutes total (2 minutes unbilled); Sidestepping on treadmill with bilateral UE support x 1 minute in each direction  with therapist adjusting speed and guarding with CGA; Quantum R single leg press 120# x 20, second set not performed due to increase in R knee pain; Sit to stand without UE supportfromelevated blue mat table x 10 progressing to lowest setting on blue mat table x 10;   Gait Training Gait training on treadmill with therapist adjusting speed from 1.0 to 1.2 mph x 4 minutes with biofeedback and cues from therapist for increased step length bilaterally and improved upright posture. Pt requires one standing rest breaks. He becomes fatigued after two and a half minutes and has to stop.    Pt educated throughout session about proper  posture and technique with exercises. Improved exercise technique, movement at target joints, use of target muscles after min to mod verbal, visual, tactile cues.   Pt is able to increase resistance with leg press today but cannot complete a second set due to increase in R knee pain. He demonstrates improved endurance for ambulation on treadmill as well as improved endurance with sidestepping. He is able to perform sit to stand today for the first time from the lowest height on the blue mat table. Patientencouraged to continue HEP and follow-up as scheduled. Hewill continue to benefit from skilled physical therapy to improve functional mobility, improve prosthetic use, and increase ambulation to increase LOF and improve overall QOL.                      PT Short Term Goals - 02/26/18 1513      PT SHORT TERM GOAL #1   Title  Patient will be independent in home exercise program to improve strength/mobility for better functional independence with ADLs.    Baseline  HEP given, 12/11/17: Performing daily:     Time  2    Period  Weeks    Status  Achieved      PT SHORT TERM GOAL #2   Title  Patient will don/doff prosthesis independently to allow for increased mobility in home.     Baseline  8/14: requires PT to guide/direct through process 8/28: independent    Time  2    Period  Weeks    Status  Achieved      PT SHORT TERM GOAL #3   Title  Patient will ambulate 10 meters with least assistive device and prosthesis to improve mobility in home.     Baseline  8/14: not walking outside of // bars yet  8/28: 63 seconds with RW and CGA     Time  2    Period  Weeks    Status  Achieved      PT SHORT TERM GOAL #4   Title  Patient will perform STS with single UE support to decrease reliance upon UE's for stability     Baseline  patient requires BUE support 11/5: 1 UR support    Time  2    Period  Weeks    Status  Achieved        PT Long Term Goals - 02/26/18 1513      PT  LONG TERM GOAL #1   Title  Patient will ambulate 60 ft with least assistive AD and prosthesis to improve mobility around home and increase independence.     Baseline  8/14: requires use of // bars 8/28: 30 ft with RW and CGA 9/17: 74 ft x2 with CGA and RW     Time  4    Period  Weeks  Status  Achieved      PT LONG TERM GOAL #2   Title  Patient (> 58 years old) will complete five times sit to stand test in < 15 seconds indicating an increased LE strength and improved balance.    Baseline  8/14: 61 seconds 8/28; 42 seconds 9/17: 24 seconds BUE support with LLE 10/10: 17 seconds with excessive BUE support  11/5: 18seconds BUE support; 12/11/17: 12/11/17: 18.5s with BUE support but no AD, 01/22/18: 17.1s BUE support but no AD; 02/26/18: 13.0s with BUE, still unable to perform without UE assistance    Time  4    Period  Weeks    Status  Partially Met    Target Date  04/04/18      PT LONG TERM GOAL #3   Title  Patient will reduce timed up and go to <11 seconds to reduce fall risk and demonstrate improved transfer/gait ability.    Baseline  8/14: unable to perform 8/28: 1 min 30 seconds 9/17: 38 seconds RW 10/10: 25 seconds with RW 11/5: 20 seconds with cane;, 12/11/17: 17.4s with spc; 01/22/18: 14.8s with spc; 02/26/18: 13.0s with spc    Time  4    Period  Weeks    Status  On-going    Target Date  04/04/18      PT LONG TERM GOAL #4   Title  Patient will increase lower extremity functional scale to >60/80 to demonstrate improved functional mobility and increased tolerance with ADLs.     Baseline  814: 29/80 8/28: 20/80 10/10: 20/80 11/5: 22/80; 12/11/17: 18/80; 01/22/18: 27/80    Time  4    Period  Weeks    Status  On-going    Target Date  04/04/18      PT LONG TERM GOAL #5   Title  Patient will increase BLE gross strength to 4+/5 as to improve functional strength for independent gait, increased standing tolerance and increased ADL ability.    Baseline  8/14: 4-/15 8/28: 4-/5  9/17: L 4-/5 R 4/5       Time  4    Period  Weeks    Status  Achieved      PT LONG TERM GOAL #6   Title  Patient will perform 10 MWT in >.5 m/s for improved mobility and safety with negotiating natural environment    Baseline  8/28: .16 m/s with RW  9/17: .53 m/s;     Time  4    Period  Weeks    Status  Achieved      PT LONG TERM GOAL #7   Title  Patient will perform 10 MWT in >1.0 m/s for improved mobility and safety with negotiating natural environment    Baseline  9/17: .53 m/s 10/10: .63 m/s with RW 11/5: 0.54ms; 12/11/17: 15.33s = 0.65 m/s with spc; 01/22/18: self-selected: 14.4s = 0.69 m/s, fastest: 11.8s = 0.85 m/s; 02/26/18: self-selected: 13.3s = 0.75 m/s, fastest: 10.6s = 0.94 m/s    Time  4    Period  Weeks    Status  Partially Met    Target Date  04/04/18      PT LONG TERM GOAL #8   Title  Patient will ambulate 300 ft with least assistive AD and prosthesis to improve mobility around home and increase independence.     Baseline  9/17: 74 ft CGA with RW 10/10 ambulated 280 ft with RW, [facility] ambulate [id number] 10/31; 01/22/18: 400', 02/26/18:  500'    Time  4    Period  Weeks    Status  Achieved      PT LONG TERM GOAL  #9   TITLE  Patient will ascend/descend 4 stairs with rail assist independently without loss of balance to improve ability to get in/out of home.     Baseline  9/17: unable to negotiate stairs 10/10: ascend with step to pattern, BUE support, and CGA 11/5: patient demonstrates ability to perform CGA and BUE support    Time  4    Period  Weeks    Status  Achieved      PT LONG TERM GOAL  #10   TITLE  Patient will ascend/descend 4 stairs with rail assist independently with reciprocating pattern without loss of balance to improve ability to get in/out of home.     Baseline  11/5: step to pattern BUE support CGA; 12/11/17: BUE heavy support and reciprocal pattern;    Time  4    Period  Weeks    Status  Achieved      PT LONG TERM GOAL  #11   TITLE  Patient will ambulate  >1088f during 647m with LRAD to demonstrate improved community ambulation and functional mobility     Baseline  11/5: will complete next session; 01/22/18: 400' in 3 minutes but had to stop secondary to pain in knee; 01/22/18: 400' in 3 minutes limited by R knee pain, 02/26/18: 500' in 3 minutes limited by low back pain    Time  4    Period  Weeks    Status  Partially Met    Target Date  04/04/18            Plan - 03/12/18 1125    Clinical Impression Statement  Pt is able to increase resistance with leg press today but cannot complete a second set due to increase in R knee pain. He demonstrates improved endurance for ambulation on treadmill as well as improved endurance with sidestepping. He is able to perform sit to stand today for the first time from the lowest height on the blue mat table. Patientencouraged to continue HEP and follow-up as scheduled. Hewill continue to benefit from skilled physical therapy to improve functional mobility, improve prosthetic use, and increase ambulation to increase LOF and improve overall QOL.    Rehab Potential  Fair    Clinical Impairments Affecting Rehab Potential  (+) previous independence, motivation to return to walking (-) lives alone, hx of diabetes, limited vision     PT Frequency  2x / week    PT Duration  8 weeks    PT Treatment/Interventions  ADLs/Self Care Home Management;Cryotherapy;Electrical Stimulation;Ultrasound;Traction;Moist Heat;DME Instruction;Gait training;Stair training;Functional mobility training;Therapeutic activities;Therapeutic exercise;Patient/family education;Neuromuscular re-education;Balance training;Prosthetic Training;Wheelchair mobility training;Manual techniques;Manual lymph drainage;Compression bandaging;Taping;Energy conservation;Passive range of motion    PT Next Visit Plan  Progress standing exercises, gait training, strengthening, balance    PT Home Exercise Plan  see [id number] on medbridge, spc ambulation    Consulted  and Agree with Plan of Care  Patient       Patient will benefit from skilled therapeutic intervention in order to improve the following deficits and impairments:  Abnormal gait, Decreased activity tolerance, Decreased balance, Decreased knowledge of precautions, Decreased endurance, Decreased knowledge of use of DME, Decreased mobility, Decreased range of motion, Difficulty walking, Decreased strength, Increased edema, Impaired flexibility, Impaired perceived functional ability, Prosthetic Dependency, Postural dysfunction, Improper body mechanics, Pain  Visit Diagnosis: Muscle weakness (generalized)  Unsteadiness  on feet  Other abnormalities of gait and mobility     Problem List Patient Active Problem List   Diagnosis Date Noted  . Abnormality of gait 10/12/2017  . Poorly controlled type 2 diabetes mellitus with peripheral neuropathy ([place])   . Flatulence   . Hypomagnesemia   . Unilateral complete BKA, right, sequela ([place])   . Benign essential HTN   . Hypoalbuminemia due to protein-calorie malnutrition ([place])   . S/P below knee amputation, right ([place]) 04/12/2017  . Acute blood loss anemia   . Other encephalopathy 04/08/2017  . Encephalopathy 04/08/2017  . Altered mental status 04/08/2017  . Labile blood pressure   . Labile blood glucose   . Drug induced constipation   . Stage 3 chronic kidney disease ([place])   . Bacteremia   . S/P unilateral BKA (below knee amputation), right ([place]) 04/04/2017  . PAD (peripheral artery disease) ([place])   . Type 2 diabetes mellitus with right diabetic foot ulcer ([place])   . Post-operative pain   . Legally blind   . Upper GI bleed   . Streptococcal bacteremia 04/01/2017  . Hypokalemia 03/30/2017  . Uncontrolled type 2 diabetes mellitus with hyperglycemia, with long-term current use of insulin ([place]) 03/30/2017  . Type 2 diabetes mellitus with peripheral neuropathy ([employer]) 03/30/2017  . AKI (acute kidney injury) ([place]) 03/30/2017  . CKD stage 3 due to  type 2 diabetes mellitus ([place]) 03/30/2017  . Sepsis ([place]) 03/30/2017  . Heart murmur 11/22/2016  . Hyperlipidemia associated with type 2 diabetes mellitus ([place]) 11/22/2016  . Obstructive sleep apnea syndrome 11/22/2016  . Essential hypertension 12/17/2012   Phillips Grout PT, DPT, GCS  Huprich,Jason 03/12/2018, 3:50 PM  [place] MAIN [facility] SERVICES [address] [place], [place], [address] Phone: [phone]   Fax:  [phone]  Name: NATHAN MOCTEZUMA MRN: [id number] Date of Birth: 02/23/1955

## 2018-03-14 ENCOUNTER — Ambulatory Visit: Payer: Medicare Other

## 2018-03-14 DIAGNOSIS — R2681 Unsteadiness on feet: Secondary | ICD-10-CM

## 2018-03-14 DIAGNOSIS — S88111S Complete traumatic amputation at level between knee and ankle, right lower leg, sequela: Secondary | ICD-10-CM

## 2018-03-14 DIAGNOSIS — R2689 Other abnormalities of gait and mobility: Secondary | ICD-10-CM

## 2018-03-14 DIAGNOSIS — M6281 Muscle weakness (generalized): Secondary | ICD-10-CM | POA: Diagnosis not present

## 2018-03-14 NOTE — Therapy (Signed)
[place] [facility] SERVICES [address] [place], [place], [address] Phone: [phone]   Fax:  [phone]  Physical Therapy Treatment  Patient Details  Name: Trevor [name] MRN: [id number] Date of Birth: 04-Feb-1955 Referring Provider (PT): Jamse Arn, DR   Encounter Date: 03/14/2018  PT End of Session - 03/14/18 1226    Visit Number  43    Number of Visits  59    Date for PT Re-Evaluation  04/04/18    Authorization Type  Eval 08/23/17; Last goals 02/26/18;    PT Start Time  1113    PT Stop Time  1158    PT Time Calculation (min)  45 min    Equipment Utilized During Treatment  Gait belt;Other (comment)   R prosthesis   Activity Tolerance  Patient tolerated treatment well    Behavior During Therapy  WFL for tasks assessed/performed       Past Medical History:  Diagnosis Date  . Diabetes mellitus without complication ([place])   . Heart murmur   . Hyperlipidemia   . Hypertension   . Sleep apnea     Past Surgical History:  Procedure Laterality Date  . AMPUTATION Right 03/31/2017   Procedure: RIGHT FOOT 1ST AND 2ND RAY AMPUTATION;  Surgeon: Newt Minion, MD;  Location: [facility];  Service: Orthopedics;  Laterality: Right;  . AMPUTATION Right 04/01/2017   Procedure: AMPUTATION BELOW KNEE;  Surgeon: Newt Minion, MD;  Location: [place];  Service: Orthopedics;  Laterality: Right;  . COLONOSCOPY WITH PROPOFOL N/A 10/02/2017   Procedure: COLONOSCOPY WITH PROPOFOL;  Surgeon: [name], MD;  Location: [facility] ENDOSCOPY;  Service: Gastroenterology;  Laterality: N/A;  . CORNEAL TRANSPLANT      There were no vitals filed for this visit.  Subjective Assessment - 03/14/18 1219    Subjective  Patient presents with redness/swelling/discoloration of R eye. Reports he is going to the doctor at 2:30 today to get it checked out.     Pertinent History  63 year old right-handed male with history of diabetes mellitus, hypertension, legally blind, and  CKD presenting for BKA 04/01/17. Patient wears contacts and can see better now.  Patient had two surgeries, one was a couple toes then they decided to go below the knee. 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 Has ambulated with RW in hospital with PT. Patient has had home health therapy and was in inpatient rehab about a month. Patient reports he was mainly sitting down, with occasional walking, last home health therapy in May. Patient reports some standing up but no exercises. Patient lives alone in the house and is scared they might fall. Patient received prosthetic leg Tuesday of last week.    Limitations  Standing;Walking;House hold activities;Other (comment)    How long can you stand comfortably?  10-15 minutes    How long can you walk comfortably?  10-15 minutes    Currently in Pain?  No/denies       Therapeutic Exercise: Hamstring stretch seated with AFO on 2x 30 seconds each LE RTB L foot inversion for neutral foot position 15x Seated LAQ kicking soccer ball back and forth with PT for coordination, muscle recruitment response, and strengthening x 3 minutes Static march LLE for neutral foot position to reduce torsion on back and reduce back pain with ambulation x 12, patient able to perform seated,  Static march in standing  with BUE support on RW, more challenging keeping neutral alignment of LLE resulting in torsion and back pain. Back stretch with SPC 10x forward lean with overpressure    Gait Training Gait training in hall x 800' with spc. Performed horizontal and vertical head turns, quick stops, and gait speed changes. One seated rest break required due ot back pain.   No R knee or residual limb pain during ambulation today;    Pt educated throughout session about proper posture and technique with exercises. Improved exercise technique, movement at target joints, use of target muscles after min to mod verbal, visual,  tactile cues.                       PT Education - 03/14/18 1225    Education Details  exercise technique/form, neutral alignment     Person(s) Educated  Patient    Methods  Explanation;Demonstration;Tactile cues;Verbal cues    Comprehension  Verbalized understanding;Returned demonstration;Verbal cues required;Tactile cues required;Need further instruction       PT Short Term Goals - 02/26/18 1513      PT SHORT TERM GOAL #1   Title  Patient will be independent in home exercise program to improve strength/mobility for better functional independence with ADLs.    Baseline  HEP given, 12/11/17: Performing daily:     Time  2    Period  Weeks    Status  Achieved      PT SHORT TERM GOAL #2   Title  Patient will don/doff prosthesis independently to allow for increased mobility in home.     Baseline  8/14: requires PT to guide/direct through process 8/28: independent    Time  2    Period  Weeks    Status  Achieved      PT SHORT TERM GOAL #3   Title  Patient will ambulate 10 meters with least assistive device and prosthesis to improve mobility in home.     Baseline  8/14: not walking outside of // bars yet  8/28: 63 seconds with RW and CGA     Time  2    Period  Weeks    Status  Achieved      PT SHORT TERM GOAL #4   Title  Patient will perform STS with single UE support to decrease reliance upon UE's for stability     Baseline  patient requires BUE support 11/5: 1 UR support    Time  2    Period  Weeks    Status  Achieved        PT Long Term Goals - 02/26/18 1513      PT LONG TERM GOAL #1   Title  Patient will ambulate 60 ft with least assistive AD and prosthesis to improve mobility around home and increase independence.     Baseline  8/14: requires use of // bars 8/28: 30 ft with RW and CGA 9/17: 74 ft x2 with CGA and RW     Time  4    Period  Weeks    Status  Achieved      PT LONG TERM GOAL #2   Title  Patient (> 46 years old) will complete five times  sit to stand test in < 15 seconds indicating an increased LE strength and improved balance.    Baseline  8/14: 61 seconds 8/28; 42 seconds 9/17: 24 seconds BUE support with LLE 10/10: 17 seconds with excessive BUE support  11/5: 18seconds BUE  support; 12/11/17: 12/11/17: 18.5s with BUE support but no AD, 01/22/18: 17.1s BUE support but no AD; 02/26/18: 13.0s with BUE, still unable to perform without UE assistance    Time  4    Period  Weeks    Status  Partially Met    Target Date  04/04/18      PT LONG TERM GOAL #3   Title  Patient will reduce timed up and go to <11 seconds to reduce fall risk and demonstrate improved transfer/gait ability.    Baseline  8/14: unable to perform 8/28: 1 min 30 seconds 9/17: 38 seconds RW 10/10: 25 seconds with RW 11/5: 20 seconds with cane;, 12/11/17: 17.4s with spc; 01/22/18: 14.8s with spc; 02/26/18: 13.0s with spc    Time  4    Period  Weeks    Status  On-going    Target Date  04/04/18      PT LONG TERM GOAL #4   Title  Patient will increase lower extremity functional scale to >60/80 to demonstrate improved functional mobility and increased tolerance with ADLs.     Baseline  814: 29/80 8/28: 20/80 10/10: 20/80 11/5: 22/80; 12/11/17: 18/80; 01/22/18: 27/80    Time  4    Period  Weeks    Status  On-going    Target Date  04/04/18      PT LONG TERM GOAL #5   Title  Patient will increase BLE gross strength to 4+/5 as to improve functional strength for independent gait, increased standing tolerance and increased ADL ability.    Baseline  8/14: 4-/15 8/28: 4-/5  9/17: L 4-/5 R 4/5      Time  4    Period  Weeks    Status  Achieved      PT LONG TERM GOAL #6   Title  Patient will perform 10 MWT in >.5 m/s for improved mobility and safety with negotiating natural environment    Baseline  8/28: .16 m/s with RW  9/17: .53 m/s;     Time  4    Period  Weeks    Status  Achieved      PT LONG TERM GOAL #7   Title  Patient will perform 10 MWT in >1.0 m/s for improved  mobility and safety with negotiating natural environment    Baseline  9/17: .53 m/s 10/10: .63 m/s with RW 11/5: 0.56ms; 12/11/17: 15.33s = 0.65 m/s with spc; 01/22/18: self-selected: 14.4s = 0.69 m/s, fastest: 11.8s = 0.85 m/s; 02/26/18: self-selected: 13.3s = 0.75 m/s, fastest: 10.6s = 0.94 m/s    Time  4    Period  Weeks    Status  Partially Met    Target Date  04/04/18      PT LONG TERM GOAL #8   Title  Patient will ambulate 300 ft with least assistive AD and prosthesis to improve mobility around home and increase independence.     Baseline  9/17: 74 ft CGA with RW 10/10 ambulated 280 ft with RW, [facility]: ambulate [id number] 10/31; 01/22/18: 400', 02/26/18: 500'    Time  4    Period  Weeks    Status  Achieved      PT LONG TERM GOAL  #9   TITLE  Patient will ascend/descend 4 stairs with rail assist independently without loss of balance to improve ability to get in/out of home.     Baseline  9/17: unable to negotiate stairs 10/10: ascend with step to  pattern, BUE support, and CGA 11/5: patient demonstrates ability to perform CGA and BUE support    Time  4    Period  Weeks    Status  Achieved      PT LONG TERM GOAL  #10   TITLE  Patient will ascend/descend 4 stairs with rail assist independently with reciprocating pattern without loss of balance to improve ability to get in/out of home.     Baseline  11/5: step to pattern BUE support CGA; 12/11/17: BUE heavy support and reciprocal pattern;    Time  4    Period  Weeks    Status  Achieved      PT LONG TERM GOAL  #11   TITLE  Patient will ambulate >1020f during 674m with LRAD to demonstrate improved community ambulation and functional mobility     Baseline  11/5: will complete next session; 01/22/18: 400' in 3 minutes but had to stop secondary to pain in knee; 01/22/18: 400' in 3 minutes limited by R knee pain, 02/26/18: 500' in 3 minutes limited by low back pain    Time  4    Period  Weeks    Status  Partially Met    Target Date   04/04/18            Plan - 03/14/18 1228    Clinical Impression Statement  Patient was limited in objects he touched and was cleaned up afterwards due to potential infection of eye. Patient was educated on neutral alignment of non prosthetic limb to reduce torsion of back reducing back pain with ambulation. Patient ambulated down to gym for the first time today with RW. Hewill continue to benefit from skilled physical therapy to improve functional mobility, improve prosthetic use, and increase ambulation to increase LOF and improve overall QOL.    Rehab Potential  Fair    Clinical Impairments Affecting Rehab Potential  (+) previous independence, motivation to return to walking (-) lives alone, hx of diabetes, limited vision     PT Frequency  2x / week    PT Duration  8 weeks    PT Treatment/Interventions  ADLs/Self Care Home Management;Cryotherapy;Electrical Stimulation;Ultrasound;Traction;Moist Heat;DME Instruction;Gait training;Stair training;Functional mobility training;Therapeutic activities;Therapeutic exercise;Patient/family education;Neuromuscular re-education;Balance training;Prosthetic Training;Wheelchair mobility training;Manual techniques;Manual lymph drainage;Compression bandaging;Taping;Energy conservation;Passive range of motion    PT Next Visit Plan  Progress standing exercises, gait training, strengthening, balance    PT Home Exercise Plan  see [id number] on medbridge, spc ambulation    Consulted and Agree with Plan of Care  Patient       Patient will benefit from skilled therapeutic intervention in order to improve the following deficits and impairments:  Abnormal gait, Decreased activity tolerance, Decreased balance, Decreased knowledge of precautions, Decreased endurance, Decreased knowledge of use of DME, Decreased mobility, Decreased range of motion, Difficulty walking, Decreased strength, Increased edema, Impaired flexibility, Impaired perceived functional ability,  Prosthetic Dependency, Postural dysfunction, Improper body mechanics, Pain  Visit Diagnosis: Muscle weakness (generalized)  Unsteadiness on feet  Other abnormalities of gait and mobility  Unilateral complete BKA, right, sequela ([place]    Problem List Patient Active Problem List   Diagnosis Date Noted  . Abnormality of gait 10/12/2017  . Poorly controlled type 2 diabetes mellitus with peripheral neuropathy ([facility]  . Flatulence   . Hypomagnesemia   . Unilateral complete BKA, right, sequela ([facility]  . Benign essential HTN   . Hypoalbuminemia due to protein-calorie malnutrition ([facility]  . S/P below knee amputation, right (  [place]) 04/12/2017  . Acute blood loss anemia   . Other encephalopathy 04/08/2017  . Encephalopathy 04/08/2017  . Altered mental status 04/08/2017  . Labile blood pressure   . Labile blood glucose   . Drug induced constipation   . Stage 3 chronic kidney disease ([place])   . Bacteremia   . S/P unilateral BKA (below knee amputation), right ([place]) 04/04/2017  . PAD (peripheral artery disease) ([place])   . Type 2 diabetes mellitus with right diabetic foot ulcer ([place])   . Post-operative pain   . Legally blind   . Upper GI bleed   . Streptococcal bacteremia 04/01/2017  . Hypokalemia 03/30/2017  . Uncontrolled type 2 diabetes mellitus with hyperglycemia, with long-term current use of insulin ([place]) 03/30/2017  . Type 2 diabetes mellitus with peripheral neuropathy ([place]) 03/30/2017  . AKI (acute kidney injury) ([place]) 03/30/2017  . CKD stage 3 due to type 2 diabetes mellitus ([place]) 03/30/2017  . Sepsis ([place]) 03/30/2017  . Heart murmur 11/22/2016  . Hyperlipidemia associated with type 2 diabetes mellitus ([place]) 11/22/2016  . Obstructive sleep apnea syndrome 11/22/2016  . Essential hypertension 12/17/2012   Janna Arch, PT, DPT   03/14/2018, 12:30 PM  [place] [facility] SERVICES [address] [place], [place], [address] Phone:  [phone]   Fax:  [phone]  Name: Trevor [name] MRN: [id number] Date of Birth: 09/11/1955

## 2018-03-19 ENCOUNTER — Ambulatory Visit: Payer: Medicare Other

## 2018-03-19 DIAGNOSIS — M6281 Muscle weakness (generalized): Secondary | ICD-10-CM | POA: Diagnosis not present

## 2018-03-19 DIAGNOSIS — R2681 Unsteadiness on feet: Secondary | ICD-10-CM

## 2018-03-19 DIAGNOSIS — S88111S Complete traumatic amputation at level between knee and ankle, right lower leg, sequela: Secondary | ICD-10-CM

## 2018-03-19 DIAGNOSIS — R2689 Other abnormalities of gait and mobility: Secondary | ICD-10-CM

## 2018-03-19 DIAGNOSIS — R5383 Other fatigue: Secondary | ICD-10-CM

## 2018-03-19 NOTE — Therapy (Signed)
[place] MAIN [facility] SERVICES [address] [place], [place], [address] Phone: [phone]   Fax:  [phone]  Physical Therapy Treatment  Patient Details  Name: Trevor [name] MRN: [id number] Date of Birth: [birth year] Referring Provider (PT): Jamse Arn, DR   Encounter Date: 03/19/2018  PT End of Session - 03/19/18 1123    Visit Number  44    Number of Visits  59    Date for PT Re-Evaluation  04/04/18    Authorization Type  Eval 08/23/17; Last goals 02/26/18;    PT Start Time  1116    PT Stop Time  1157    PT Time Calculation (min)  41 min    Equipment Utilized During Treatment  --   Rt BKA prosthesis   Activity Tolerance  Patient tolerated treatment well    Behavior During Therapy  WFL for tasks assessed/performed       Past Medical History:  Diagnosis Date  . Diabetes mellitus without complication ([place])   . Heart murmur   . Hyperlipidemia   . Hypertension   . Sleep apnea     Past Surgical History:  Procedure Laterality Date  . AMPUTATION Right 03/31/2017   Procedure: RIGHT FOOT 1ST AND 2ND RAY AMPUTATION;  Surgeon: Newt Minion, MD;  Location: [place];  Service: Orthopedics;  Laterality: Right;  . AMPUTATION Right 04/01/2017   Procedure: AMPUTATION BELOW KNEE;  Surgeon: Newt Minion, MD;  Location: [place];  Service: Orthopedics;  Laterality: Right;  . COLONOSCOPY WITH PROPOFOL N/A 10/02/2017   Procedure: COLONOSCOPY WITH PROPOFOL;  Surgeon: [name], MD;  Location: [facility] ENDOSCOPY;  Service: Gastroenterology;  Laterality: N/A;  . CORNEAL TRANSPLANT      There were no vitals filed for this visit.  Subjective Assessment - 03/19/18 1122    Subjective  Pt doign well this date. Reports his MD made no formal diagnosis for the eye redness, but doe snot think it to be consisten twith pink eye. He started some newRx eye drops and reports things to be cslightly improved.     Pertinent History  63 year old right-handed male  with history of diabetes mellitus, hypertension, legally blind, and CKD presenting for BKA 04/01/17. Patient wears contacts and can see better now.  Patient had two surgeries, one was a couple toes then they decided to go below the knee. 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 Has ambulated with RW in hospital with PT. Patient has had home health therapy and was in inpatient rehab about a month. Patient reports he was mainly sitting down, with occasional walking, last home health therapy in May. Patient reports some standing up but no exercises. Patient lives alone in the house and is scared they might fall. Patient received prosthetic leg Tuesday of last week.    Currently in Pain?  No/denies       TREATMENT     Therapeutic Exercise: -Octane warm-up L3 with therapist monitoring for fatigue and adjusting accordingly. History obtained. 3 minutes totals  -Sidestepping on treadmill with bilateral UE support 2x60sec bilat, 0.3MPH; standing rest between. Supervision level. VC to keep entire shoe on belt -Sit to stand without UE support from elevated hi/lo mat 2x10 (height of chair + 2airex pads) (VC not to rush, and to establish a full upright-tall posture)  -Overground AMB 1x572f c SPC  in LUE, 2-point step through pattern, 0.775m  -  Alternating toe taps to 6" step without UE support x 10 each (20 total) decreased stance time on RLE (no LOB, no hands needed for support)  -Lateral step-up 1x10 bilat (unable to complete last set, 7 performed, stops 2/2 increasing issues with back pain) airex pad and SPC used.      PT Short Term Goals - 02/26/18 1513      PT SHORT TERM GOAL #1   Title  Patient will be independent in home exercise program to improve strength/mobility for better functional independence with ADLs.    Baseline  HEP given, 12/11/17: Performing daily:     Time  2    Period  Weeks    Status  Achieved      PT SHORT  TERM GOAL #2   Title  Patient will don/doff prosthesis independently to allow for increased mobility in home.     Baseline  8/14: requires PT to guide/direct through process 8/28: independent    Time  2    Period  Weeks    Status  Achieved      PT SHORT TERM GOAL #3   Title  Patient will ambulate 10 meters with least assistive device and prosthesis to improve mobility in home.     Baseline  8/14: not walking outside of // bars yet  8/28: 63 seconds with RW and CGA     Time  2    Period  Weeks    Status  Achieved      PT SHORT TERM GOAL #4   Title  Patient will perform STS with single UE support to decrease reliance upon UE's for stability     Baseline  patient requires BUE support 11/5: 1 UR support    Time  2    Period  Weeks    Status  Achieved        PT Long Term Goals - 02/26/18 1513      PT LONG TERM GOAL #1   Title  Patient will ambulate 60 ft with least assistive AD and prosthesis to improve mobility around home and increase independence.     Baseline  8/14: requires use of // bars 8/28: 30 ft with RW and CGA 9/17: 74 ft x2 with CGA and RW     Time  4    Period  Weeks    Status  Achieved      PT LONG TERM GOAL #2   Title  Patient (> 62 years old) will complete five times sit to stand test in < 15 seconds indicating an increased LE strength and improved balance.    Baseline  8/14: 61 seconds 8/28; 42 seconds 9/17: 24 seconds BUE support with LLE 10/10: 17 seconds with excessive BUE support  11/5: 18seconds BUE support; 12/11/17: 12/11/17: 18.5s with BUE support but no AD, 01/22/18: 17.1s BUE support but no AD; 02/26/18: 13.0s with BUE, still unable to perform without UE assistance    Time  4    Period  Weeks    Status  Partially Met    Target Date  04/04/18      PT LONG TERM GOAL #3   Title  Patient will reduce timed up and go to <11 seconds to reduce fall risk and demonstrate improved transfer/gait ability.    Baseline  8/14: unable to perform 8/28: 1 min 30 seconds  9/17: 38 seconds RW 10/10: 25 seconds with RW 11/5: 20 seconds with cane;, 12/11/17: 17.4s with spc; 01/22/18: 14.8s with spc; 02/26/18:  13.0s with spc    Time  4    Period  Weeks    Status  On-going    Target Date  04/04/18      PT LONG TERM GOAL #4   Title  Patient will increase lower extremity functional scale to >60/80 to demonstrate improved functional mobility and increased tolerance with ADLs.     Baseline  814: 29/80 8/28: 20/80 10/10: 20/80 11/5: 22/80; 12/11/17: 18/80; 01/22/18: 27/80    Time  4    Period  Weeks    Status  On-going    Target Date  04/04/18      PT LONG TERM GOAL #5   Title  Patient will increase BLE gross strength to 4+/5 as to improve functional strength for independent gait, increased standing tolerance and increased ADL ability.    Baseline  8/14: 4-/15 8/28: 4-/5  9/17: L 4-/5 R 4/5      Time  4    Period  Weeks    Status  Achieved      PT LONG TERM GOAL #6   Title  Patient will perform 10 MWT in >.5 m/s for improved mobility and safety with negotiating natural environment    Baseline  8/28: .16 m/s with RW  9/17: .53 m/s;     Time  4    Period  Weeks    Status  Achieved      PT LONG TERM GOAL #7   Title  Patient will perform 10 MWT in >1.0 m/s for improved mobility and safety with negotiating natural environment    Baseline  9/17: .53 m/s 10/10: .63 m/s with RW 11/5: 0.28ms; 12/11/17: 15.33s = 0.65 m/s with spc; 01/22/18: self-selected: 14.4s = 0.69 m/s, fastest: 11.8s = 0.85 m/s; 02/26/18: self-selected: 13.3s = 0.75 m/s, fastest: 10.6s = 0.94 m/s    Time  4    Period  Weeks    Status  Partially Met    Target Date  04/04/18      PT LONG TERM GOAL #8   Title  Patient will ambulate 300 ft with least assistive AD and prosthesis to improve mobility around home and increase independence.     Baseline  9/17: 74 ft CGA with RW 10/10 ambulated 280 ft with RW, [facility]: ambulate 4641fon 10/31; 01/22/18: 400', 02/26/18: 500'    Time  4    Period   Weeks    Status  Achieved      PT LONG TERM GOAL  #9   TITLE  Patient will ascend/descend 4 stairs with rail assist independently without loss of balance to improve ability to get in/out of home.     Baseline  9/17: unable to negotiate stairs 10/10: ascend with step to pattern, BUE support, and CGA 11/5: patient demonstrates ability to perform CGA and BUE support    Time  4    Period  Weeks    Status  Achieved      PT LONG TERM GOAL  #10   TITLE  Patient will ascend/descend 4 stairs with rail assist independently with reciprocating pattern without loss of balance to improve ability to get in/out of home.     Baseline  11/5: step to pattern BUE support CGA; 12/11/17: BUE heavy support and reciprocal pattern;    Time  4    Period  Weeks    Status  Achieved      PT LONG TERM GOAL  #11   TITLE  Patient will ambulate >109f during 666m with LRAD to demonstrate improved community ambulation and functional mobility     Baseline  11/5: will complete next session; 01/22/18: 400' in 3 minutes but had to stop secondary to pain in knee; 01/22/18: 400' in 3 minutes limited by R knee pain, 02/26/18: 500' in 3 minutes limited by low back pain    Time  4    Period  Weeks    Status  Partially Met    Target Date  04/04/18              Patient will benefit from skilled therapeutic intervention in order to improve the following deficits and impairments:     Visit Diagnosis: Muscle weakness (generalized)  Unsteadiness on feet  Other abnormalities of gait and mobility  Unilateral complete BKA, right, sequela (HCNorge Lethargy     Problem List Patient Active Problem List   Diagnosis Date Noted  . Abnormality of gait 10/12/2017  . Poorly controlled type 2 diabetes mellitus with peripheral neuropathy (HCBent  . Flatulence   . Hypomagnesemia   . Unilateral complete BKA, right, sequela (HCHampton  . Benign essential HTN   . Hypoalbuminemia due to protein-calorie malnutrition (HCJunction City  . S/P  below knee amputation, right (HCPeck04/03/2017  . Acute blood loss anemia   . Other encephalopathy 04/08/2017  . Encephalopathy 04/08/2017  . Altered mental status 04/08/2017  . Labile blood pressure   . Labile blood glucose   . Drug induced constipation   . Stage 3 chronic kidney disease (HCMount Lena  . Bacteremia   . S/P unilateral BKA (below knee amputation), right (HCMaywood Park03/26/2019  . PAD (peripheral artery disease) (HCMarysville  . Type 2 diabetes mellitus with right diabetic foot ulcer (HCEl Ren[place]  . Post-operative pain   . Legally blind   . Upper GI bleed   . Streptococcal bacteremia 04/01/2017  . Hypokalemia 03/30/2017  . Uncontrolled type 2 diabetes mellitus with hyperglycemia, with long-term current use of insulin (HCJennings03/21/2019  . Type 2 diabetes mellitus with peripheral neuropathy (HCParis03/21/2019  . AKI (acute kidney injury) (HCMount Airy03/21/2019  . CKD stage 3 due to type 2 diabetes mellitus (HCEufaula03/21/2019  . Sepsis (HCWater Valley03/21/2019  . Heart murmur 11/22/2016  . Hyperlipidemia associated with type 2 diabetes mellitus (HCNorth Haven11/13/2018  . Obstructive sleep apnea syndrome 11/22/2016  . Essential hypertension 12/17/2012    12:06 PM, 03/19/18 AlEtta GrandchildPT, DPT Physical Therapist - CoGleed [facility] CenterOutpatient Physical Therapy- MaAlondra [phone]  BuEtta Grandchild/09/2018, 12:05 PM  CoSpinkAIN RESpace Coast Surgery CenterERVICES [address] Bay RoaddCoyne CenterNCAlaska[address]hone: [phone] Fax:  [phone]: Trevor [name]: [id number] of Birth: 5/April 30, 1955

## 2018-03-21 ENCOUNTER — Other Ambulatory Visit: Payer: Self-pay

## 2018-03-21 ENCOUNTER — Ambulatory Visit: Payer: Medicare Other

## 2018-03-21 VITALS — BP 125/77 | HR 62

## 2018-03-21 DIAGNOSIS — R2681 Unsteadiness on feet: Secondary | ICD-10-CM

## 2018-03-21 DIAGNOSIS — M6281 Muscle weakness (generalized): Secondary | ICD-10-CM

## 2018-03-21 DIAGNOSIS — R2689 Other abnormalities of gait and mobility: Secondary | ICD-10-CM

## 2018-03-21 NOTE — Therapy (Signed)
[place] MAIN [facility] SERVICES [address] [place], [place], [address] Phone: [phone]   Fax:  [phone]  Physical Therapy Treatment  Patient Details  Name: Trevor [name] MRN: [id number] Date of Birth: 1955/09/12 Referring Provider (PT): Jamse Arn, DR   Encounter Date: 03/21/2018  PT End of Session - 03/21/18 1129    Visit Number  45    Number of Visits  59    Date for PT Re-Evaluation  04/04/18    Authorization Type  Eval 08/23/17; Last goals 02/26/18;    PT Start Time  1125    PT Stop Time  1205    PT Time Calculation (min)  40 min    Equipment Utilized During Treatment  --   Rt BKA prosthesis   Activity Tolerance  Patient tolerated treatment well    Behavior During Therapy  WFL for tasks assessed/performed       Past Medical History:  Diagnosis Date  . Diabetes mellitus without complication ([place])   . Heart murmur   . Hyperlipidemia   . Hypertension   . Sleep apnea     Past Surgical History:  Procedure Laterality Date  . AMPUTATION Right 03/31/2017   Procedure: RIGHT FOOT 1ST AND 2ND RAY AMPUTATION;  Surgeon: Newt Minion, MD;  Location: [place];  Service: Orthopedics;  Laterality: Right;  . AMPUTATION Right 04/01/2017   Procedure: AMPUTATION BELOW KNEE;  Surgeon: Newt Minion, MD;  Location: [place];  Service: Orthopedics;  Laterality: Right;  . COLONOSCOPY WITH PROPOFOL N/A 10/02/2017   Procedure: COLONOSCOPY WITH PROPOFOL;  Surgeon: [name], MD;  Location: [facility] ENDOSCOPY;  Service: Gastroenterology;  Laterality: N/A;  . CORNEAL TRANSPLANT      Vitals:   03/21/18 1128  BP: 125/77  Pulse: 62  SpO2: 98%    Subjective Assessment - 03/21/18 1126    Subjective  Pt doing well this date. No more issues with his R eye. Was advised to discontinue the antibiotic drops but to increase his steroid to twice a day. Saw [employer] and they took a mold for a new socket. No pain upon arrival. No specific questions or  concerns.     Pertinent History  63 year old right-handed male with history of diabetes mellitus, hypertension, legally blind, and CKD presenting for BKA 04/01/17. Patient wears contacts and can see better now.  Patient had two surgeries, one was a couple toes then they decided to go below the knee. 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 Has ambulated with RW in hospital with PT. Patient has had home health therapy and was in inpatient rehab about a month. Patient reports he was mainly sitting down, with occasional walking, last home health therapy in May. Patient reports some standing up but no exercises. Patient lives alone in the house and is scared they might fall. Patient received prosthetic leg Tuesday of last week.    Currently in Pain?  No/denies           TREATMENT   Therapeutic Exercise: Sit to stand from regular height chair with 2" Airex pad on seat without UE support x 10; Standing hip flexion marches with 3# ankle weight (AW) x 15 bilateral; Standing hip abduction with 3# AW x 15 bilateral; Standing SLR flexion with 3# AW x 15 bilateral; Standing HS curls with 3# AW x 15 bilateral; Quantum R single leg press 120#  x 20, 135# x 10;   Gait Training Gait trainingin [id number]' withspc. Performed horizontal and vertical head turns, quick stops, gait speed changes, and 180 degree turns. No seated rest breaks required.No R knee or residual limb pain during ambulation today. Pt unwilling to attempt without single point cane due to continued feelings of instability in R knee.    Pt educated throughout session about proper posture and technique with exercises. Improved exercise technique, movement at target joints, use of target muscles after min to mod verbal, visual, tactile cues.   Pt is able to increase resistance with leg press today and denies any increase in leg pain. He is able to progress his  standing exercises as well.  Pt unwilling to attempt ambulation without a single point cane due to continued feelings of instability in RLE prosthesis. Patientencouraged to continue HEP and follow-up as scheduled. Hewill continue to benefit from skilled physical therapy to improve functional mobility, improve prosthetic use, and increase ambulation to increase LOF and improve overall QOL.                      PT Short Term Goals - 02/26/18 1513      PT SHORT TERM GOAL #1   Title  Patient will be independent in home exercise program to improve strength/mobility for better functional independence with ADLs.    Baseline  HEP given, 12/11/17: Performing daily:     Time  2    Period  Weeks    Status  Achieved      PT SHORT TERM GOAL #2   Title  Patient will don/doff prosthesis independently to allow for increased mobility in home.     Baseline  8/14: requires PT to guide/direct through process 8/28: independent    Time  2    Period  Weeks    Status  Achieved      PT SHORT TERM GOAL #3   Title  Patient will ambulate 10 meters with least assistive device and prosthesis to improve mobility in home.     Baseline  8/14: not walking outside of // bars yet  8/28: 63 seconds with RW and CGA     Time  2    Period  Weeks    Status  Achieved      PT SHORT TERM GOAL #4   Title  Patient will perform STS with single UE support to decrease reliance upon UE's for stability     Baseline  patient requires BUE support 11/5: 1 UR support    Time  2    Period  Weeks    Status  Achieved        PT Long Term Goals - 02/26/18 1513      PT LONG TERM GOAL #1   Title  Patient will ambulate 60 ft with least assistive AD and prosthesis to improve mobility around home and increase independence.     Baseline  8/14: requires use of // bars 8/28: 30 ft with RW and CGA 9/17: 74 ft x2 with CGA and RW     Time  4    Period  Weeks    Status  Achieved      PT LONG TERM GOAL #2   Title  Patient  (> 89 years old) will complete five times sit to stand test in < 15 seconds indicating an increased LE strength and improved balance.    Baseline  8/14: 61 seconds 8/28; 42 seconds 9/17: 24  seconds BUE support with LLE 10/10: 17 seconds with excessive BUE support  11/5: 18seconds BUE support; 12/11/17: 12/11/17: 18.5s with BUE support but no AD, 01/22/18: 17.1s BUE support but no AD; 02/26/18: 13.0s with BUE, still unable to perform without UE assistance    Time  4    Period  Weeks    Status  Partially Met    Target Date  04/04/18      PT LONG TERM GOAL #3   Title  Patient will reduce timed up and go to <11 seconds to reduce fall risk and demonstrate improved transfer/gait ability.    Baseline  8/14: unable to perform 8/28: 1 min 30 seconds 9/17: 38 seconds RW 10/10: 25 seconds with RW 11/5: 20 seconds with cane;, 12/11/17: 17.4s with spc; 01/22/18: 14.8s with spc; 02/26/18: 13.0s with spc    Time  4    Period  Weeks    Status  On-going    Target Date  04/04/18      PT LONG TERM GOAL #4   Title  Patient will increase lower extremity functional scale to >60/80 to demonstrate improved functional mobility and increased tolerance with ADLs.     Baseline  814: 29/80 8/28: 20/80 10/10: 20/80 11/5: 22/80; 12/11/17: 18/80; 01/22/18: 27/80    Time  4    Period  Weeks    Status  On-going    Target Date  04/04/18      PT LONG TERM GOAL #5   Title  Patient will increase BLE gross strength to 4+/5 as to improve functional strength for independent gait, increased standing tolerance and increased ADL ability.    Baseline  8/14: 4-/15 8/28: 4-/5  9/17: L 4-/5 R 4/5      Time  4    Period  Weeks    Status  Achieved      PT LONG TERM GOAL #6   Title  Patient will perform 10 MWT in >.5 m/s for improved mobility and safety with negotiating natural environment    Baseline  8/28: .16 m/s with RW  9/17: .53 m/s;     Time  4    Period  Weeks    Status  Achieved      PT LONG TERM GOAL #7   Title  Patient will  perform 10 MWT in >1.0 m/s for improved mobility and safety with negotiating natural environment    Baseline  9/17: .53 m/s 10/10: .63 m/s with RW 11/5: 0.28ms; 12/11/17: 15.33s = 0.65 m/s with spc; 01/22/18: self-selected: 14.4s = 0.69 m/s, fastest: 11.8s = 0.85 m/s; 02/26/18: self-selected: 13.3s = 0.75 m/s, fastest: 10.6s = 0.94 m/s    Time  4    Period  Weeks    Status  Partially Met    Target Date  04/04/18      PT LONG TERM GOAL #8   Title  Patient will ambulate 300 ft with least assistive AD and prosthesis to improve mobility around home and increase independence.     Baseline  9/17: 74 ft CGA with RW 10/10 ambulated 280 ft with RW, [facility]: ambulate 4643fon 10/31; 01/22/18: 400', 02/26/18: 500'    Time  4    Period  Weeks    Status  Achieved      PT LONG TERM GOAL  #9   TITLE  Patient will ascend/descend 4 stairs with rail assist independently without loss of balance to improve ability to get in/out of home.  Baseline  9/17: unable to negotiate stairs 10/10: ascend with step to pattern, BUE support, and CGA 11/5: patient demonstrates ability to perform CGA and BUE support    Time  4    Period  Weeks    Status  Achieved      PT LONG TERM GOAL  #10   TITLE  Patient will ascend/descend 4 stairs with rail assist independently with reciprocating pattern without loss of balance to improve ability to get in/out of home.     Baseline  11/5: step to pattern BUE support CGA; 12/11/17: BUE heavy support and reciprocal pattern;    Time  4    Period  Weeks    Status  Achieved      PT LONG TERM GOAL  #11   TITLE  Patient will ambulate >1052f during 646m with LRAD to demonstrate improved community ambulation and functional mobility     Baseline  11/5: will complete next session; 01/22/18: 400' in 3 minutes but had to stop secondary to pain in knee; 01/22/18: 400' in 3 minutes limited by R knee pain, 02/26/18: 500' in 3 minutes limited by low back pain    Time  4    Period  Weeks     Status  Partially Met    Target Date  04/04/18            Plan - 03/21/18 1149    Clinical Impression Statement  Pt is able to increase resistance with leg press today and denies any increase in leg pain. He is able to progress his standing exercises as well.  Pt unwilling to attempt ambulation without a single point cane due to continued feelings of instability in RLE prosthesis. Patientencouraged to continue HEP and follow-up as scheduled. Hewill continue to benefit from skilled physical therapy to improve functional mobility, improve prosthetic use, and increase ambulation to increase LOF and improve overall QOL.    Rehab Potential  Fair    Clinical Impairments Affecting Rehab Potential  (+) previous independence, motivation to return to walking (-) lives alone, hx of diabetes, limited vision     PT Frequency  2x / week    PT Duration  8 weeks    PT Treatment/Interventions  ADLs/Self Care Home Management;Cryotherapy;Electrical Stimulation;Ultrasound;Traction;Moist Heat;DME Instruction;Gait training;Stair training;Functional mobility training;Therapeutic activities;Therapeutic exercise;Patient/family education;Neuromuscular re-education;Balance training;Prosthetic Training;Wheelchair mobility training;Manual techniques;Manual lymph drainage;Compression bandaging;Taping;Energy conservation;Passive range of motion    PT Next Visit Plan  Progress standing exercises, gait training, strengthening, balance    PT Home Exercise Plan  see [id number] on medbridge, spc ambulation    Consulted and Agree with Plan of Care  Patient       Patient will benefit from skilled therapeutic intervention in order to improve the following deficits and impairments:  Abnormal gait, Decreased activity tolerance, Decreased balance, Decreased knowledge of precautions, Decreased endurance, Decreased knowledge of use of DME, Decreased mobility, Decreased range of motion, Difficulty walking, Decreased strength, Increased  edema, Impaired flexibility, Impaired perceived functional ability, Prosthetic Dependency, Postural dysfunction, Improper body mechanics, Pain  Visit Diagnosis: Muscle weakness (generalized)  Unsteadiness on feet  Other abnormalities of gait and mobility     Problem List Patient Active Problem List   Diagnosis Date Noted  . Abnormality of gait 10/12/2017  . Poorly controlled type 2 diabetes mellitus with peripheral neuropathy ([place]  . Flatulence   . Hypomagnesemia   . Unilateral complete BKA, right, sequela ([place]  . Benign essential HTN   . Hypoalbuminemia due to protein-calorie  malnutrition ([place])   . S/P below knee amputation, right ([place]) 04/12/2017  . Acute blood loss anemia   . Other encephalopathy 04/08/2017  . Encephalopathy 04/08/2017  . Altered mental status 04/08/2017  . Labile blood pressure   . Labile blood glucose   . Drug induced constipation   . Stage 3 chronic kidney disease ([place])   . Bacteremia   . S/P unilateral BKA (below knee amputation), right ([place]) 04/04/2017  . PAD (peripheral artery disease) ([place])   . Type 2 diabetes mellitus with right diabetic foot ulcer ([place])   . Post-operative pain   . Legally blind   . Upper GI bleed   . Streptococcal bacteremia 04/01/2017  . Hypokalemia 03/30/2017  . Uncontrolled type 2 diabetes mellitus with hyperglycemia, with long-term current use of insulin ([place]) 03/30/2017  . Type 2 diabetes mellitus with peripheral neuropathy ([place]) 03/30/2017  . AKI (acute kidney injury) ([place]) 03/30/2017  . CKD stage 3 due to type 2 diabetes mellitus ([place]) 03/30/2017  . Sepsis ([place]) 03/30/2017  . Heart murmur 11/22/2016  . Hyperlipidemia associated with type 2 diabetes mellitus ([place]) 11/22/2016  . Obstructive sleep apnea syndrome 11/22/2016  . Essential hypertension 12/17/2012   Phillips Grout PT, DPT, GCS  Hershy Flenner 03/21/2018, 3:18 PM  [place] MAIN [facility] SERVICES [address]  [place], [place], [address] Phone: [phone]   Fax:  [phone]  Name: Trevor [name] MRN: [id number] Date of Birth: [birth year]

## 2018-03-26 ENCOUNTER — Other Ambulatory Visit: Payer: Self-pay

## 2018-03-26 ENCOUNTER — Ambulatory Visit: Payer: Medicare Other

## 2018-03-26 VITALS — BP 140/74 | HR 62

## 2018-03-26 DIAGNOSIS — M6281 Muscle weakness (generalized): Secondary | ICD-10-CM

## 2018-03-26 DIAGNOSIS — R2681 Unsteadiness on feet: Secondary | ICD-10-CM

## 2018-03-26 NOTE — Therapy (Signed)
[place] MAIN [facility] SERVICES [address] [place], [place], [address] Phone: [phone]   Fax:  [phone]  Physical Therapy Treatment  Patient Details  Name: Trevor [name] MRN: [id number] Date of Birth: [birth year] Referring Provider (PT): Jamse Arn, DR   Encounter Date: 03/26/2018  PT End of Session - 03/26/18 1143    Visit Number  [id number]    Number of Visits  72    Date for PT Re-Evaluation  04/04/18    Authorization Type  Eval 08/23/17; Last goals 02/26/18;    PT Start Time  1118    PT Stop Time  1200    PT Time Calculation (min)  42 min    Equipment Utilized During Treatment  --   Rt BKA prosthesis   Activity Tolerance  Patient tolerated treatment well    Behavior During Therapy  WFL for tasks assessed/performed       Past Medical History:  Diagnosis Date  . Diabetes mellitus without complication ([place])   . Heart murmur   . Hyperlipidemia   . Hypertension   . Sleep apnea     Past Surgical History:  Procedure Laterality Date  . AMPUTATION Right 03/31/2017   Procedure: RIGHT FOOT 1ST AND 2ND RAY AMPUTATION;  Surgeon: Newt Minion, MD;  Location: [place];  Service: Orthopedics;  Laterality: Right;  . AMPUTATION Right 04/01/2017   Procedure: AMPUTATION BELOW KNEE;  Surgeon: Newt Minion, MD;  Location: [place];  Service: Orthopedics;  Laterality: Right;  . COLONOSCOPY WITH PROPOFOL N/A 10/02/2017   Procedure: COLONOSCOPY WITH PROPOFOL;  Surgeon: [name], MD;  Location: [facility];  Service: Gastroenterology;  Laterality: N/A;  . CORNEAL TRANSPLANT      Vitals:   03/26/18 1122  BP: 140/74  Pulse: 62  SpO2: 99%    Subjective Assessment - 03/26/18 1141    Subjective  Pt doing well this date. His R eye was slightly aggravated last night so he decided to wear his glasses again. He reports some residual R limb pain upon arrival today. Rates it as a 3/10. Denies any redness or swelling to R residual limb. No specific  questions currently. He has an appointment to see his prosthetist tomorrow to discuss his new socket.     Pertinent History  63 year old right-handed male with history of diabetes mellitus, hypertension, legally blind, and CKD presenting for BKA 04/01/17. Patient wears contacts and can see better now.  Patient had two surgeries, one was a couple toes then they decided to go below the knee. 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 Has ambulated with RW in hospital with PT. Patient has had home health therapy and was in inpatient rehab about a month. Patient reports he was mainly sitting down, with occasional walking, last home health therapy in May. Patient reports some standing up but no exercises. Patient lives alone in the house and is scared they might fall. Patient received prosthetic leg Tuesday of last week.    Currently in Pain?  Yes    Pain Score  3     Pain Location  Knee    Pain Orientation  Right    Pain Descriptors / Indicators  Sore    Pain Type  Chronic pain    Pain Onset  More than a month ago           TREATMENT  Therapeutic Exercise: Quantum R single leg press 120# x20, only one set performed due to increase in R knee pain; Sit to stand from regular height chair with 2" Airex pad on seat without UE support x 10; Standing hip flexion marches with 4# ankle weight (AW) x 15 bilateral; Standing hip abduction with 4# AW x 15 bilateral; Standing SLR flexion with 4# AW x 15 bilateral; Standing HS curls with 4# AW x 15 bilateral; Sidestepping in // bars with 4# AW x 2 lengths each direction;   Gait Training Gait trainingin [id number]' withsingle point cane in LUE. Performed horizontal and vertical head turns, quick stops,gait speed changes, and 180 degree turns. One seated rest breaks required.No R knee or residual limb pain during ambulation today however mild increase in back pain. Pt unwilling to  attempt without single point cane due to continued feelings of instability in R knee.    Pt educated throughout session about proper posture and technique with exercises. Improved exercise technique, movement at target joints, use of target muscles after min to mod verbal, visual, tactile cues.   Pt is able toincrease resistance with leg press today but is limited to one set due to increase in R knee pain. He is able to almost double his ambulation distance and his gait speed has notably improved. He is able to progress his standing exercises as well.  He has a follow-up appointment with his prosthetist tomorrow to discuss his new socket. Will update his goals during 04/02/18 visit. Patientencouraged to continue HEP and follow-up as scheduled. Hewill continue to benefit from skilled physical therapy to improve functional mobility, improve prosthetic use, and increase ambulation to increase LOF and improve overall QOL.                      PT Short Term Goals - 02/26/18 1513      PT SHORT TERM GOAL #1   Title  Patient will be independent in home exercise program to improve strength/mobility for better functional independence with ADLs.    Baseline  HEP given, 12/11/17: Performing daily:     Time  2    Period  Weeks    Status  Achieved      PT SHORT TERM GOAL #2   Title  Patient will don/doff prosthesis independently to allow for increased mobility in home.     Baseline  8/14: requires PT to guide/direct through process 8/28: independent    Time  2    Period  Weeks    Status  Achieved      PT SHORT TERM GOAL #3   Title  Patient will ambulate 10 meters with least assistive device and prosthesis to improve mobility in home.     Baseline  8/14: not walking outside of // bars yet  8/28: 63 seconds with RW and CGA     Time  2    Period  Weeks    Status  Achieved      PT SHORT TERM GOAL #4   Title  Patient will perform STS with single UE support to decrease reliance  upon UE's for stability     Baseline  patient requires BUE support 11/5: 1 UR support    Time  2    Period  Weeks    Status  Achieved        PT Long Term Goals - 02/26/18 1513      PT LONG TERM GOAL #1   Title  Patient  will ambulate 60 ft with least assistive AD and prosthesis to improve mobility around home and increase independence.     Baseline  8/14: requires use of // bars 8/28: 30 ft with RW and CGA 9/17: 74 ft x2 with CGA and RW     Time  4    Period  Weeks    Status  Achieved      PT LONG TERM GOAL #2   Title  Patient (> 54 years old) will complete five times sit to stand test in < 15 seconds indicating an increased LE strength and improved balance.    Baseline  8/14: 61 seconds 8/28; 42 seconds 9/17: 24 seconds BUE support with LLE 10/10: 17 seconds with excessive BUE support  11/5: 18seconds BUE support; 12/11/17: 12/11/17: 18.5s with BUE support but no AD, 01/22/18: 17.1s BUE support but no AD; 02/26/18: 13.0s with BUE, still unable to perform without UE assistance    Time  4    Period  Weeks    Status  Partially Met    Target Date  04/04/18      PT LONG TERM GOAL #3   Title  Patient will reduce timed up and go to <11 seconds to reduce fall risk and demonstrate improved transfer/gait ability.    Baseline  8/14: unable to perform 8/28: 1 min 30 seconds 9/17: 38 seconds RW 10/10: 25 seconds with RW 11/5: 20 seconds with cane;, 12/11/17: 17.4s with spc; 01/22/18: 14.8s with spc; 02/26/18: 13.0s with spc    Time  4    Period  Weeks    Status  On-going    Target Date  04/04/18      PT LONG TERM GOAL #4   Title  Patient will increase lower extremity functional scale to >60/80 to demonstrate improved functional mobility and increased tolerance with ADLs.     Baseline  814: 29/80 8/28: 20/80 10/10: 20/80 11/5: 22/80; 12/11/17: 18/80; 01/22/18: 27/80    Time  4    Period  Weeks    Status  On-going    Target Date  04/04/18      PT LONG TERM GOAL #5   Title  Patient will increase  BLE gross strength to 4+/5 as to improve functional strength for independent gait, increased standing tolerance and increased ADL ability.    Baseline  8/14: 4-/15 8/28: 4-/5  9/17: L 4-/5 R 4/5      Time  4    Period  Weeks    Status  Achieved      PT LONG TERM GOAL #6   Title  Patient will perform 10 MWT in >.5 m/s for improved mobility and safety with negotiating natural environment    Baseline  8/28: .16 m/s with RW  9/17: .53 m/s;     Time  4    Period  Weeks    Status  Achieved      PT LONG TERM GOAL #7   Title  Patient will perform 10 MWT in >1.0 m/s for improved mobility and safety with negotiating natural environment    Baseline  9/17: .53 m/s 10/10: .63 m/s with RW 11/5: 0.15ms; 12/11/17: 15.33s = 0.65 m/s with spc; 01/22/18: self-selected: 14.4s = 0.69 m/s, fastest: 11.8s = 0.85 m/s; 02/26/18: self-selected: 13.3s = 0.75 m/s, fastest: 10.6s = 0.94 m/s    Time  4    Period  Weeks    Status  Partially Met    Target Date  04/04/18  PT LONG TERM GOAL #8   Title  Patient will ambulate 300 ft with least assistive AD and prosthesis to improve mobility around home and increase independence.     Baseline  9/17: 74 ft CGA with RW 10/10 ambulated 280 ft with RW, 70 ft with [facility] 11/5: ambulate 461f on 10/31; 01/22/18: 400', 02/26/18: 500'    Time  4    Period  Weeks    Status  Achieved      PT LONG TERM GOAL  #9   TITLE  Patient will ascend/descend 4 stairs with rail assist independently without loss of balance to improve ability to get in/out of home.     Baseline  9/17: unable to negotiate stairs 10/10: ascend with step to pattern, BUE support, and CGA 11/5: patient demonstrates ability to perform CGA and BUE support    Time  4    Period  Weeks    Status  Achieved      PT LONG TERM GOAL  #10   TITLE  Patient will ascend/descend 4 stairs with rail assist independently with reciprocating pattern without loss of balance to improve ability to get in/out of home.     Baseline  11/5:  step to pattern BUE support CGA; 12/11/17: BUE heavy support and reciprocal pattern;    Time  4    Period  Weeks    Status  Achieved      PT LONG TERM GOAL  #11   TITLE  Patient will ambulate >10072fduring 63m43mwith LRAD to demonstrate improved community ambulation and functional mobility     Baseline  11/5: will complete next session; 01/22/18: 400' in 3 minutes but had to stop secondary to pain in knee; 01/22/18: 400' in 3 minutes limited by R knee pain, 02/26/18: 500' in 3 minutes limited by low back pain    Time  4    Period  Weeks    Status  Partially Met    Target Date  04/04/18            Plan - 03/26/18 1144    Clinical Impression Statement  Pt is able toincrease resistance with leg press today but is limited to one set due to increase in R knee pain. He is able to almost double his ambulation distance and his gait speed has notably improved. He is able to progress his standing exercises as well.  He has a follow-up appointment with his prosthetist tomorrow to discuss his new socket. Will update his goals during 04/02/18 visit. Patientencouraged to continue HEP and follow-up as scheduled. Hewill continue to benefit from skilled physical therapy to improve functional mobility, improve prosthetic use, and increase ambulation to increase LOF and improve overall QOL.    Rehab Potential  Fair    Clinical Impairments Affecting Rehab Potential  (+) previous independence, motivation to return to walking (-) lives alone, hx of diabetes, limited vision     PT Frequency  2x / week    PT Duration  8 weeks    PT Treatment/Interventions  ADLs/Self Care Home Management;Cryotherapy;Electrical Stimulation;Ultrasound;Traction;Moist Heat;DME Instruction;Gait training;Stair training;Functional mobility training;Therapeutic activities;Therapeutic exercise;Patient/family education;Neuromuscular re-education;Balance training;Prosthetic Training;Wheelchair mobility training;Manual techniques;Manual lymph  drainage;Compression bandaging;Taping;Energy conservation;Passive range of motion    PT Next Visit Plan  Update goals visit 04/02/18, Progress standing exercises, gait training, strengthening, balance    PT Home Exercise Plan  see [id number] on medbridge, spc ambulation    Consulted and Agree with Plan of Care  Patient  Patient will benefit from skilled therapeutic intervention in order to improve the following deficits and impairments:  Abnormal gait, Decreased activity tolerance, Decreased balance, Decreased knowledge of precautions, Decreased endurance, Decreased knowledge of use of DME, Decreased mobility, Decreased range of motion, Difficulty walking, Decreased strength, Increased edema, Impaired flexibility, Impaired perceived functional ability, Prosthetic Dependency, Postural dysfunction, Improper body mechanics, Pain  Visit Diagnosis: Muscle weakness (generalized)  Unsteadiness on feet     Problem List Patient Active Problem List   Diagnosis Date Noted  . Abnormality of gait 10/12/2017  . Poorly controlled type 2 diabetes mellitus with peripheral neuropathy ([place])   . Flatulence   . Hypomagnesemia   . Unilateral complete BKA, right, sequela ([place])   . Benign essential HTN   . Hypoalbuminemia due to protein-calorie malnutrition ([place])   . S/P below knee amputation, right ([place]) 04/12/2017  . Acute blood loss anemia   . Other encephalopathy 04/08/2017  . Encephalopathy 04/08/2017  . Altered mental status 04/08/2017  . Labile blood pressure   . Labile blood glucose   . Drug induced constipation   . Stage 3 chronic kidney disease ([place])   . Bacteremia   . S/P unilateral BKA (below knee amputation), right ([place]) 04/04/2017  . PAD (peripheral artery disease) ([place])   . Type 2 diabetes mellitus with right diabetic foot ulcer ([place])   . Post-operative pain   . Legally blind   . Upper GI bleed   . Streptococcal bacteremia 04/01/2017  . Hypokalemia 03/30/2017  . Uncontrolled  type 2 diabetes mellitus with hyperglycemia, with long-term current use of insulin ([place]) 03/30/2017  . Type 2 diabetes mellitus with peripheral neuropathy ([place]) 03/30/2017  . AKI (acute kidney injury) ([place]) 03/30/2017  . CKD stage 3 due to type 2 diabetes mellitus ([place]) 03/30/2017  . Sepsis ([place]) 03/30/2017  . Heart murmur 11/22/2016  . Hyperlipidemia associated with type 2 diabetes mellitus ([place]) 11/22/2016  . Obstructive sleep apnea syndrome 11/22/2016  . Essential hypertension 12/17/2012   Phillips Grout PT, DPT, GCS  Dalan Cowger 03/26/2018, 3:47 PM  [place] MAIN [facility] SERVICES [address] [place], [place], [address] Phone: [phone]   Fax:  [phone]  Name: Trevor [name] MRN: [id number] Date of Birth: 26-Jun-1955

## 2018-03-28 ENCOUNTER — Other Ambulatory Visit: Payer: Self-pay

## 2018-03-28 ENCOUNTER — Ambulatory Visit: Payer: Medicare Other

## 2018-03-28 VITALS — BP 127/65 | HR 62

## 2018-03-28 DIAGNOSIS — M6281 Muscle weakness (generalized): Secondary | ICD-10-CM | POA: Diagnosis not present

## 2018-03-28 DIAGNOSIS — R2681 Unsteadiness on feet: Secondary | ICD-10-CM

## 2018-03-28 DIAGNOSIS — R2689 Other abnormalities of gait and mobility: Secondary | ICD-10-CM

## 2018-03-28 NOTE — Therapy (Signed)
[place] MAIN [facility] SERVICES [address] [place], [place], [address] Phone: [phone]   Fax:  [phone]  Physical Therapy Treatment  Patient Details  Name: Trevor [name] MRN: [id number] Date of Birth: [birth year] Referring Provider (PT): Jamse Arn, DR   Encounter Date: 03/28/2018  PT End of Session - 03/28/18 1147    Visit Number  61    Number of Visits  59    Date for PT Re-Evaluation  04/04/18    Authorization Type  Eval 08/23/17; Last goals 02/26/18;    PT Start Time  1115    PT Stop Time  1200    PT Time Calculation (min)  45 min    Equipment Utilized During Treatment  Gait belt   Rt BKA prosthesis   Activity Tolerance  Patient tolerated treatment well    Behavior During Therapy  WFL for tasks assessed/performed       Past Medical History:  Diagnosis Date  . Diabetes mellitus without complication ([place])   . Heart murmur   . Hyperlipidemia   . Hypertension   . Sleep apnea     Past Surgical History:  Procedure Laterality Date  . AMPUTATION Right 03/31/2017   Procedure: RIGHT FOOT 1ST AND 2ND RAY AMPUTATION;  Surgeon: Newt Minion, MD;  Location: [place];  Service: Orthopedics;  Laterality: Right;  . AMPUTATION Right 04/01/2017   Procedure: AMPUTATION BELOW KNEE;  Surgeon: Newt Minion, MD;  Location: [place];  Service: Orthopedics;  Laterality: Right;  . COLONOSCOPY WITH PROPOFOL N/A 10/02/2017   Procedure: COLONOSCOPY WITH PROPOFOL;  Surgeon: [name], MD;  Location: [facility] ENDOSCOPY;  Service: Gastroenterology;  Laterality: N/A;  . CORNEAL TRANSPLANT      Vitals:   03/28/18 1119  BP: 127/65  Pulse: 62  SpO2: 98%    Subjective Assessment - 03/28/18 1119    Subjective  Pt doing well this date. He had an appointment to see his prosthetist yesterday and they are still trying to get to right fit for his new socket before it is finalized. No pain reported today and no specific questions or concerns.    Pertinent  History  63 year old right-handed male with history of diabetes mellitus, hypertension, legally blind, and CKD presenting for BKA 04/01/17. Patient wears contacts and can see better now.  Patient had two surgeries, one was a couple toes then they decided to go below the knee. 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 Has ambulated with RW in hospital with PT. Patient has had home health therapy and was in inpatient rehab about a month. Patient reports he was mainly sitting down, with occasional walking, last home health therapy in May. Patient reports some standing up but no exercises. Patient lives alone in the house and is scared they might fall. Patient received prosthetic leg Tuesday of last week.    Limitations  Standing;Walking;House hold activities;Other (comment)    How long can you stand comfortably?  10-15 minutes    How long can you walk comfortably?  10-15 minutes    Patient Stated Goals  going up steps and driving car.     Currently in Pain?  No/denies          TREATMENT   Therapeutic Exercise: Sit to stand from regular height chair with 2" Airex pad on seat without UE support x 10; 6" forward step taps alternating  LE without UE support x 10 bilateral; 6" lateral step taps x 10 bilateral; 6" step-ups alternating LE x 10 each; Mini squats x 10 without UE support;  Sit to stand from regular height chair with 2" Airex pad on seat x 10;   Gait Training Gait trainingin [id number]' withsingle point cane in LUE. Performed horizontal and vertical head turns, quick stops,gait speed changes, and 180 degree turns. One seated rest breaks required.No R knee or residual limb pain during ambulation and less back pain today compared to previous sessions. Pt unwilling to attempt withoutsingle point canedue to continued feelings of instability in R knee.   Pt educated throughout session about proper posture and  technique with exercises. Improved exercise technique, movement at target joints, use of target muscles after min to mod verbal, visual, tactile cues.   Pt is again able to perform prolonged ambulation today. Prosthetist adjusted RLE to increase mild toe out which pt reports helps with his gait. He continues to feel unstable on his RLE and will not attempt ambulation with single point cane. He is still unable to perform sit to stand from regular height chair. He will need updated outcome measures/goals at next session. Patientencouraged to continue HEP and follow-up as scheduled. Hewill continue to benefit from skilled physical therapy to improve functional mobility, improve prosthetic use, and increase ambulation to increase LOF and improve overall QOL.                       PT Short Term Goals - 02/26/18 1513      PT SHORT TERM GOAL #1   Title  Patient will be independent in home exercise program to improve strength/mobility for better functional independence with ADLs.    Baseline  HEP given, 12/11/17: Performing daily:     Time  2    Period  Weeks    Status  Achieved      PT SHORT TERM GOAL #2   Title  Patient will don/doff prosthesis independently to allow for increased mobility in home.     Baseline  8/14: requires PT to guide/direct through process 8/28: independent    Time  2    Period  Weeks    Status  Achieved      PT SHORT TERM GOAL #3   Title  Patient will ambulate 10 meters with least assistive device and prosthesis to improve mobility in home.     Baseline  8/14: not walking outside of // bars yet  8/28: 63 seconds with RW and CGA     Time  2    Period  Weeks    Status  Achieved      PT SHORT TERM GOAL #4   Title  Patient will perform STS with single UE support to decrease reliance upon UE's for stability     Baseline  patient requires BUE support 11/5: 1 UR support    Time  2    Period  Weeks    Status  Achieved        PT Long Term Goals -  02/26/18 1513      PT LONG TERM GOAL #1   Title  Patient will ambulate 60 ft with least assistive AD and prosthesis to improve mobility around home and increase independence.     Baseline  8/14: requires use of // bars 8/28: 30 ft with RW and CGA 9/17: 74 ft x2 with CGA and RW     Time  4  Period  Weeks    Status  Achieved      PT LONG TERM GOAL #2   Title  Patient (> 26 years old) will complete five times sit to stand test in < 15 seconds indicating an increased LE strength and improved balance.    Baseline  8/14: 61 seconds 8/28; 42 seconds 9/17: 24 seconds BUE support with LLE 10/10: 17 seconds with excessive BUE support  11/5: 18seconds BUE support; 12/11/17: 12/11/17: 18.5s with BUE support but no AD, 01/22/18: 17.1s BUE support but no AD; 02/26/18: 13.0s with BUE, still unable to perform without UE assistance    Time  4    Period  Weeks    Status  Partially Met    Target Date  04/04/18      PT LONG TERM GOAL #3   Title  Patient will reduce timed up and go to <11 seconds to reduce fall risk and demonstrate improved transfer/gait ability.    Baseline  8/14: unable to perform 8/28: 1 min 30 seconds 9/17: 38 seconds RW 10/10: 25 seconds with RW 11/5: 20 seconds with cane;, 12/11/17: 17.4s with spc; 01/22/18: 14.8s with spc; 02/26/18: 13.0s with spc    Time  4    Period  Weeks    Status  On-going    Target Date  04/04/18      PT LONG TERM GOAL #4   Title  Patient will increase lower extremity functional scale to >60/80 to demonstrate improved functional mobility and increased tolerance with ADLs.     Baseline  814: 29/80 8/28: 20/80 10/10: 20/80 11/5: 22/80; 12/11/17: 18/80; 01/22/18: 27/80    Time  4    Period  Weeks    Status  On-going    Target Date  04/04/18      PT LONG TERM GOAL #5   Title  Patient will increase BLE gross strength to 4+/5 as to improve functional strength for independent gait, increased standing tolerance and increased ADL ability.    Baseline  8/14: 4-/15 8/28:  4-/5  9/17: L 4-/5 R 4/5      Time  4    Period  Weeks    Status  Achieved      PT LONG TERM GOAL #6   Title  Patient will perform 10 MWT in >.5 m/s for improved mobility and safety with negotiating natural environment    Baseline  8/28: .16 m/s with RW  9/17: .53 m/s;     Time  4    Period  Weeks    Status  Achieved      PT LONG TERM GOAL #7   Title  Patient will perform 10 MWT in >1.0 m/s for improved mobility and safety with negotiating natural environment    Baseline  9/17: .53 m/s 10/10: .63 m/s with RW 11/5: 0.61ms; 12/11/17: 15.33s = 0.65 m/s with spc; 01/22/18: self-selected: 14.4s = 0.69 m/s, fastest: 11.8s = 0.85 m/s; 02/26/18: self-selected: 13.3s = 0.75 m/s, fastest: 10.6s = 0.94 m/s    Time  4    Period  Weeks    Status  Partially Met    Target Date  04/04/18      PT LONG TERM GOAL #8   Title  Patient will ambulate 300 ft with least assistive AD and prosthesis to improve mobility around home and increase independence.     Baseline  9/17: 74 ft CGA with RW 10/10 ambulated 280 ft with RW, [facility] ambulate  442f on 10/31; 01/22/18: 400', 02/26/18: 500'    Time  4    Period  Weeks    Status  Achieved      PT LONG TERM GOAL  #9   TITLE  Patient will ascend/descend 4 stairs with rail assist independently without loss of balance to improve ability to get in/out of home.     Baseline  9/17: unable to negotiate stairs 10/10: ascend with step to pattern, BUE support, and CGA 11/5: patient demonstrates ability to perform CGA and BUE support    Time  4    Period  Weeks    Status  Achieved      PT LONG TERM GOAL  #10   TITLE  Patient will ascend/descend 4 stairs with rail assist independently with reciprocating pattern without loss of balance to improve ability to get in/out of home.     Baseline  11/5: step to pattern BUE support CGA; 12/11/17: BUE heavy support and reciprocal pattern;    Time  4    Period  Weeks    Status  Achieved      PT LONG TERM GOAL  #11    TITLE  Patient will ambulate >10060fduring 59m59mwith LRAD to demonstrate improved community ambulation and functional mobility     Baseline  11/5: will complete next session; 01/22/18: 400' in 3 minutes but had to stop secondary to pain in knee; 01/22/18: 400' in 3 minutes limited by R knee pain, 02/26/18: 500' in 3 minutes limited by low back pain    Time  4    Period  Weeks    Status  Partially Met    Target Date  04/04/18            Plan - 03/28/18 1148    Clinical Impression Statement  Pt is again able to perform prolonged ambulation today. Prosthetist adjusted RLE to increase mild toe out which pt reports helps with his gait. He continues to feel unstable on his RLE and will not attempt ambulation with single point cane. He is still unable to perform sit to stand from regular height chair. He will need updated outcome measures/goals at next session. Patientencouraged to continue HEP and follow-up as scheduled. Hewill continue to benefit from skilled physical therapy to improve functional mobility, improve prosthetic use, and increase ambulation to increase LOF and improve overall QOL.    Rehab Potential  Fair    Clinical Impairments Affecting Rehab Potential  (+) previous independence, motivation to return to walking (-) lives alone, hx of diabetes, limited vision     PT Frequency  2x / week    PT Duration  8 weeks    PT Treatment/Interventions  ADLs/Self Care Home Management;Cryotherapy;Electrical Stimulation;Ultrasound;Traction;Moist Heat;DME Instruction;Gait training;Stair training;Functional mobility training;Therapeutic activities;Therapeutic exercise;Patient/family education;Neuromuscular re-education;Balance training;Prosthetic Training;Wheelchair mobility training;Manual techniques;Manual lymph drainage;Compression bandaging;Taping;Energy conservation;Passive range of motion    PT Next Visit Plan  Update goals visit 04/02/18, Progress standing exercises, gait training, strengthening,  balance    PT Home Exercise Plan  see [id number] on medbridge, spc ambulation    Consulted and Agree with Plan of Care  Patient       Patient will benefit from skilled therapeutic intervention in order to improve the following deficits and impairments:  Abnormal gait, Decreased activity tolerance, Decreased balance, Decreased knowledge of precautions, Decreased endurance, Decreased knowledge of use of DME, Decreased mobility, Decreased range of motion, Difficulty walking, Decreased strength, Increased edema, Impaired flexibility, Impaired perceived functional ability, Prosthetic Dependency, Postural  dysfunction, Improper body mechanics, Pain  Visit Diagnosis: Muscle weakness (generalized)  Unsteadiness on feet  Other abnormalities of gait and mobility     Problem List Patient Active Problem List   Diagnosis Date Noted  . Abnormality of gait 10/12/2017  . Poorly controlled type 2 diabetes mellitus with peripheral neuropathy ([place])   . Flatulence   . Hypomagnesemia   . Unilateral complete BKA, right, sequela ([place])   . Benign essential HTN   . Hypoalbuminemia due to protein-calorie malnutrition ([place])   . S/P below knee amputation, right ([place]) 04/12/2017  . Acute blood loss anemia   . Other encephalopathy 04/08/2017  . Encephalopathy 04/08/2017  . Altered mental status 04/08/2017  . Labile blood pressure   . Labile blood glucose   . Drug induced constipation   . Stage 3 chronic kidney disease ([place])   . Bacteremia   . S/P unilateral BKA (below knee amputation), right ([place]) 04/04/2017  . PAD (peripheral artery disease) ([place])   . Type 2 diabetes mellitus with right diabetic foot ulcer ([place])   . Post-operative pain   . Legally blind   . Upper GI bleed   . Streptococcal bacteremia 04/01/2017  . Hypokalemia 03/30/2017  . Uncontrolled type 2 diabetes mellitus with hyperglycemia, with long-term current use of insulin ([place]) 03/30/2017  . Type 2 diabetes mellitus with peripheral  neuropathy ([place]) 03/30/2017  . AKI (acute kidney injury) ([place]) 03/30/2017  . CKD stage 3 due to type 2 diabetes mellitus ([place]) 03/30/2017  . Sepsis ([name]) 03/30/2017  . Heart murmur 11/22/2016  . Hyperlipidemia associated with type 2 diabetes mellitus ([place]) 11/22/2016  . Obstructive sleep apnea syndrome 11/22/2016  . Essential hypertension 12/17/2012   Phillips Grout PT, DPT, GCS  Lizbet Cirrincione 03/28/2018, 2:27 PM  [place] MAIN [facility] SERVICES [address] [place], [place], [address] Phone: [phone]   Fax:  [phone]  Name: SYAIRE SABER MRN: [id number] Date of Birth: 09/08/1955

## 2018-04-02 ENCOUNTER — Ambulatory Visit: Payer: Medicare Other

## 2018-04-03 NOTE — Therapy (Signed)
[place] MAIN [facility] SERVICES [address] [place], [place], [address] Phone: [phone]   Fax:  [phone]  Patient Details  Name: Trevor [name] MRN: [id number] Date of Birth: 12-Jan-1955 Referring Provider:  No ref. provider found  Encounter Date: 04/03/2018   PT called and left message for pt to notify of outpatient physical therapy clinic closure secondary to COVID-19 precautions. Therapist requested patient return call if needed with any questions or concerns regarding his home physical therapy program while clinic is closed. Therapist provided pt with clinic phone number on voicemail.    Phillips Grout PT, DPT, GCS  Huprich,Jason 04/03/2018, 9:39 AM  [place] MAIN [facility] SERVICES [address] [place], [place], [address] Phone: [phone]   Fax:  [phone]

## 2018-04-04 ENCOUNTER — Ambulatory Visit: Payer: Medicare Other

## 2018-04-09 ENCOUNTER — Ambulatory Visit: Payer: Medicare Other

## 2018-04-11 ENCOUNTER — Ambulatory Visit: Payer: Medicare Other | Attending: Physical Medicine & Rehabilitation

## 2018-04-12 ENCOUNTER — Encounter: Payer: Medicare Other | Admitting: Physical Medicine & Rehabilitation

## 2018-04-16 ENCOUNTER — Ambulatory Visit: Payer: Medicare Other | Admitting: Podiatry

## 2018-04-19 ENCOUNTER — Ambulatory Visit: Payer: Medicare Other | Admitting: Podiatry

## 2018-04-23 ENCOUNTER — Ambulatory Visit: Payer: Medicare Other | Admitting: Podiatry

## 2018-04-23 NOTE — Therapy (Signed)
[place] MAIN [facility] SERVICES [address] [place], [place], [address] Phone: [phone]   Fax:  [phone]  Patient Details  Name: Trevor [name] MRN: [id number] Date of Birth: 03/07/1955 Referring Provider:  No ref. provider found  Encounter Date: 04/23/2018  PT called and left message for pt to notify of continued outpatient physical therapy clinic closure secondary to COVID-19 precautions. No answer, left voicemail requesting return call to discuss home health and telehealth options. Therapist also requesting update from patient regarding progress with home exercise program during clinic closure.     Phillips Grout PT, DPT, GCS  Laurenashley Viar 04/23/2018, 11:34 AM  [place] MAIN [facility] SERVICES [address] [place], [place], [address] Phone: [phone]   Fax:  [phone]

## 2018-04-26 ENCOUNTER — Other Ambulatory Visit: Payer: Self-pay

## 2018-04-26 ENCOUNTER — Ambulatory Visit (INDEPENDENT_AMBULATORY_CARE_PROVIDER_SITE_OTHER): Payer: Medicare Other | Admitting: Podiatry

## 2018-04-26 ENCOUNTER — Encounter: Payer: Self-pay | Admitting: Podiatry

## 2018-04-26 VITALS — Temp 97.2°F

## 2018-04-26 DIAGNOSIS — E1142 Type 2 diabetes mellitus with diabetic polyneuropathy: Secondary | ICD-10-CM | POA: Diagnosis not present

## 2018-04-26 DIAGNOSIS — M79675 Pain in left toe(s): Secondary | ICD-10-CM

## 2018-04-26 DIAGNOSIS — B351 Tinea unguium: Secondary | ICD-10-CM

## 2018-04-26 DIAGNOSIS — L309 Dermatitis, unspecified: Secondary | ICD-10-CM

## 2018-04-26 NOTE — Progress Notes (Signed)
Complaint:  Visit Type: Patient returns to my office for continued preventative foot care services. Complaint: Patient states" my nails have grown long and thick and become painful to walk and wear shoes" Patient has been diagnosed with DM with neuropathy left foot.  Patient has amputation right leg.. The patient presents for preventative foot care services. No changes to ROS.  Patient has diabetic neuropathy and angiopathy.  Podiatric Exam: Vascular: dorsalis pedis and posterior tibial pulses are not  palpable bilateral. Capillary return is immediate. Temperature gradient is WNL. Skin turgor WNL  Sensorium: Diminished  Semmes Weinstein monofilament test. Normal tactile sensation bilaterally. Nail Exam: Pt has thick disfigured discolored nails with subungual debris noted left  entire nail hallux through fifth toenails Ulcer Exam: There is no evidence of ulcer or pre-ulcerative changes or infection. Orthopedic Exam: Muscle tone and strength are WNL. No limitations in general ROM. No crepitus or effusions noted. Foot type and digits show no abnormalities. Bony prominences are unremarkable. Skin: No Porokeratosis. No infection or ulcers.  Crust with fizzures medial plantar aspect left foot.  Diagnosis:  Onychomycosis, , Pain in right toe, pain in left toes  Treatment & Plan Procedures and Treatment: Consent by patient was obtained for treatment procedures.   Debridement of mycotic and hypertrophic toenails, 1 through 5 bilateral and clearing of subungual debris. No ulceration, no infection noted. Patient says his crusts on left foot have improved since his last visit. Return Visit-Office Procedure: Patient instructed to return to the office for a follow up visit 10 weeks for continued evaluation and treatment.    Gardiner Barefoot DPM

## 2018-05-03 ENCOUNTER — Other Ambulatory Visit: Payer: Self-pay

## 2018-05-03 ENCOUNTER — Encounter: Payer: Self-pay | Admitting: Physical Medicine & Rehabilitation

## 2018-05-03 ENCOUNTER — Encounter: Payer: Medicare Other | Attending: Physical Medicine & Rehabilitation | Admitting: Physical Medicine & Rehabilitation

## 2018-05-03 DIAGNOSIS — S88111S Complete traumatic amputation at level between knee and ankle, right lower leg, sequela: Secondary | ICD-10-CM

## 2018-05-03 DIAGNOSIS — R269 Unspecified abnormalities of gait and mobility: Secondary | ICD-10-CM

## 2018-05-03 NOTE — Progress Notes (Signed)
Subjective:    Patient ID: Trevor [name], male    DOB: 1955/02/21, 63 y.o.   MRN: [id number]  TELEHEALTH NOTE  Due to national recommendations of social distancing due to COVID 19, an audio/video telehealth visit is felt to be most appropriate for this patient at this time.  See Chart message from today for the patient's consent to telehealth from [place].     I verified that I am speaking with the correct person using two identifiers.  Location of patient: Home Location of provider: Office Method of communication: Telephone Names of participants : Zorita Pang scheduling, Wenda Overland obtaining consent and vitals if available Established patient Time spent on call: 12 minutes   HPI  63 year old right-handed male with history of diabetes mellitus, hypertension, legally blind, and CKD presents for follow up for right BKA.    Last clinic visit 01/11/2018.  Since last visit, pt states he was in therapies, but it has put on hold due to [employer].  He is doing HEP.  He was released by surgery. He states he is having another prosthetic made. Denies falls. He is predominantly using cane for ambulation. CBGs have been relatively controlled.   Pain Inventory Average Pain 3 Pain Right Now 0 My pain is intermittent, dull, stabbing and right hip and leg  In the last 24 hours, has pain interfered with the following? General activity 0 Relation with others 0 Enjoyment of life 0 What TIME of day is your pain at its worst? morning  for his hip, and evening and night for his leg Sleep (in general) Fair  Pain is worse with: sitting and sitting affects hip Pain improves with: rest and therapy/exercise Relief from Meds: 10  Mobility walk with assistance use a cane ability to climb steps?  yes do you drive?  no  Function disabled: date disabled . I need assistance with the following:  bathing, meal prep, household duties and shopping  Neuro/Psych  trouble walking  Physicians involved in your care Any changes since last visit?  no   No family history on file. Social History   Socioeconomic History  . Marital status: Single    Spouse name: Not on file  . Number of children: Not on file  . Years of education: Not on file  . Highest education level: Not on file  Occupational History  . Not on file  Social Needs  . Financial resource strain: Not on file  . Food insecurity:    Worry: Not on file    Inability: Not on file  . Transportation needs:    Medical: Not on file    Non-medical: Not on file  Tobacco Use  . Smoking status: Never Smoker  . Smokeless tobacco: Never Used  Substance and Sexual Activity  . Alcohol use: No  . Drug use: Never  . Sexual activity: Not on file  Lifestyle  . Physical activity:    Days per week: Not on file    Minutes per session: Not on file  . Stress: Not on file  Relationships  . Social connections:    Talks on phone: Not on file    Gets together: Not on file    Attends religious service: Not on file    Active member of club or organization: Not on file    Attends meetings of clubs or organizations: Not on file    Relationship status: Not on file  Other Topics Concern  . Not on  file  Social History Narrative  . Not on file   Past Surgical History:  Procedure Laterality Date  . AMPUTATION Right 03/31/2017   Procedure: RIGHT FOOT 1ST AND 2ND RAY AMPUTATION;  Surgeon: Newt Minion, MD;  Location: [place];  Service: Orthopedics;  Laterality: Right;  . AMPUTATION Right 04/01/2017   Procedure: AMPUTATION BELOW KNEE;  Surgeon: Newt Minion, MD;  Location: [place];  Service: Orthopedics;  Laterality: Right;  . COLONOSCOPY WITH PROPOFOL N/A 10/02/2017   Procedure: COLONOSCOPY WITH PROPOFOL;  Surgeon: [name], MD;  Location[facility] END[facility]COPY;  Service: Gastroenterology;  Laterality: N/A;  . CORNEAL TRANSPLANT     Past Medical History:  Diagnosis Date  . Diabetes mellitus without  complication ([place])   . Heart murmur   . Hyperlipidemia   . Hypertension   . Sleep apnea    There were no vitals taken for this visit.  Opioid Risk Score:   Fall Risk Score:  `1  Depression screen PHQ 2/9  Depression screen [facility] 2/9 10/12/2017 08/10/2017 06/26/2017 05/11/2017 03/29/2017 11/22/2016  Decreased Interest 0 0 0 0 2 0  Down, Depressed, Hopeless 0 0 0 0 0 0  PHQ - 2 Score 0 0 0 0 2 0  Altered sleeping - - 1 1 0 1  Tired, decreased energy - - 1 0 3 1  Change in appetite - - - 0 1 0  Feeling bad or failure about yourself  - - 0 0 0 0  Trouble concentrating - - 0 0 0 0  Moving slowly or fidgety/restless - - 0 0 1 1  Suicidal thoughts - - 0 0 0 0  PHQ-9 Score - - 2 1 7 3     Review of Systems  Constitutional: Negative.   HENT: Negative.   Eyes: Negative.   Respiratory: Negative.   Cardiovascular: Negative.   Gastrointestinal: Negative.   Endocrine: Negative.        High blood sugar  Genitourinary: Negative.   Musculoskeletal: Positive for gait problem.  Skin: Negative.   Allergic/Immunologic: Negative.   Neurological:       Tingling   Hematological: Negative.   Psychiatric/Behavioral: Negative.   All other systems reviewed and are negative.      Objective:   Physical Exam Gen: NAD. Pulm: Effort normal Neuro: Alert and oriented    Assessment & Plan:  63 year old right-handed male with history of diabetes mellitus, hypertension, legally blind, and CKD presents for follow up for right BKA.    1.  Right transtibial amputation   Cont PT for gait training when possible  Released by Ortho  He is awaiting a new prosthesis due to the last one being ill fitting.   Patient attempts to describes a part of his prosthesis that he would like modified, ?foot - encouraged follow up with prosthetist  2. Gait abnormality  Resume therapies when appropriate  Cont cane for safety

## 2018-06-07 ENCOUNTER — Other Ambulatory Visit: Payer: Self-pay

## 2018-06-07 ENCOUNTER — Ambulatory Visit: Payer: Medicare Other | Attending: Physical Medicine & Rehabilitation

## 2018-06-07 VITALS — BP 142/78 | HR 65

## 2018-06-07 DIAGNOSIS — R2689 Other abnormalities of gait and mobility: Secondary | ICD-10-CM | POA: Diagnosis present

## 2018-06-07 DIAGNOSIS — M6281 Muscle weakness (generalized): Secondary | ICD-10-CM | POA: Diagnosis not present

## 2018-06-07 DIAGNOSIS — R2681 Unsteadiness on feet: Secondary | ICD-10-CM | POA: Insufficient documentation

## 2018-06-07 NOTE — Therapy (Addendum)
[place] MAIN [facility] SERVICES [address] [place], [place], [address] Phone: [phone]   Fax:  [phone]  Physical Therapy Treatment  Patient Details  Name: Trevor [name] MRN: [id number] Date of Birth: Jan 08, 1956 Referring Provider (PT): Jamse Arn, DR   Encounter Date: 06/07/2018  PT End of Session - 06/07/18 1616    Visit Number  48    Number of Visits  73    Date for PT Re-Evaluation  08/30/18    Authorization Type  Eval 08/23/17; Last goals 02/26/18; updated today 06/07/18    PT Start Time  1405    PT Stop Time  1437    PT Time Calculation (min)  32 min    Equipment Utilized During Treatment  Gait belt   Rt BKA prosthesis   Activity Tolerance  Patient tolerated treatment well    Behavior During Therapy  WFL for tasks assessed/performed       Past Medical History:  Diagnosis Date  . Diabetes mellitus without complication ([place])   . Heart murmur   . Hyperlipidemia   . Hypertension   . Sleep apnea     Past Surgical History:  Procedure Laterality Date  . AMPUTATION Right 03/31/2017   Procedure: RIGHT FOOT 1ST AND 2ND RAY AMPUTATION;  Surgeon: Newt Minion, MD;  Location: [place];  Service: Orthopedics;  Laterality: Right;  . AMPUTATION Right 04/01/2017   Procedure: AMPUTATION BELOW KNEE;  Surgeon: Newt Minion, MD;  Location: [place];  Service: Orthopedics;  Laterality: Right;  . COLONOSCOPY WITH PROPOFOL N/A 10/02/2017   Procedure: COLONOSCOPY WITH PROPOFOL;  Surgeon: [name], MD;  Location: [facility] ENDOSCOPY;  Service: Gastroenterology;  Laterality: N/A;  . CORNEAL TRANSPLANT      Vitals:   06/07/18 1412  BP: (!) 142/78  Pulse: 65  SpO2: 100%    Subjective Assessment - 06/07/18 1410    Subjective  Pt doing well this date. He got his new prosthetic yesterday and walked out of the facility with it donned but otherwise has not walked with it. No pain reported today. No specific questions or concerns at this time.      Pertinent History  63 year old right-handed male with history of diabetes mellitus, hypertension, legally blind, and CKD presenting for BKA 04/01/17. Patient wears contacts and can see better now.  Patient had two surgeries, one was a couple toes then they decided to go below the knee. 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 Has ambulated with RW in hospital with PT. Patient has had home health therapy and was in inpatient rehab about a month. Patient reports he was mainly sitting down, with occasional walking, last home health therapy in May. Patient reports some standing up but no exercises. Patient lives alone in the house and is scared they might fall. Patient received prosthetic leg Tuesday of last week.    Limitations  Standing;Walking;House hold activities;Other (comment)    How long can you stand comfortably?  10-15 minutes    How long can you walk comfortably?  10-15 minutes    Patient Stated Goals  going up steps and driving car.     Currently in Pain?  No/denies          TREATMENT   Therapeutic Exercise: Updated goals with pt including: 5TSTS: 25.4s with single UE support during first rep only; TUG: 16.1s with RW; 10mgait  speed: self-selected: 14.0s = 0.71 m/s, fastest: 11.7s = 0.85 m/s; 6MWT:Deferred due to new prosthetic; Pt completed LEFS with assist from therapist:30/80; Updated goals with patient and discussed plan of care;   Gait training Ambulated in hallway with patient using new prosthetic leg x approximately 200'. Rolling walker utilized until pt has gained practice and confidence with his new prosthetic. Pt provided intermittent cues for upright posture as well as step length. He denies any increase in pain however does have some mild pain due to the tight fit of the new prosthetic.    Pt educated throughout session about proper posture and technique with exercises. Improved exercise  technique, movement at target joints, use of target muscles after min to mod verbal, visual, tactile cues.   Pt demonstrating improvement in some of his outcome measures since they were last updated and decline in others. His 5TSTS increased from 13s to 25.4s however pt is now able to perform 4 out of the 5 reps without UE assistance. Prior to the break he was unable to perform sit to stand from a regular height chair without at least single UE support. It seems like at least part of this improvement is his ability to flex his knee more with the new prosthetic. His 10 meter gait speed and TUG are slightly worse than they were before the break. His LEFS is 30/80 which is essentially unchanged from before. Deferred Six Minute Walk Test today since this is the first full day pt has had his new prosthetic and he is still working on his confidence.Patientencouraged to continue HEP and follow-up as scheduled. Hewill continue to benefit from skilled physical therapy to improve functional mobility, improve prosthetic use, and increase ambulation to increase LOF and improve overall QOL.                        PT Short Term Goals - 06/07/18 1617      PT SHORT TERM GOAL #1   Title  Patient will be independent in home exercise program to improve strength/mobility for better functional independence with ADLs.    Baseline  HEP given, 12/11/17: Performing daily:     Time  2    Period  Weeks    Status  Achieved      PT SHORT TERM GOAL #2   Title  Patient will don/doff prosthesis independently to allow for increased mobility in home.     Baseline  8/14: requires PT to guide/direct through process 8/28: independent    Time  2    Period  Weeks    Status  Achieved      PT SHORT TERM GOAL #3   Title  Patient will ambulate 10 meters with least assistive device and prosthesis to improve mobility in home.     Baseline  8/14: not walking outside of // bars yet  8/28: 63 seconds with RW and  CGA     Time  2    Period  Weeks    Status  Achieved      PT SHORT TERM GOAL #4   Title  Patient will perform STS with single UE support to decrease reliance upon UE's for stability     Baseline  patient requires BUE support 11/5: 1 UR support    Time  2    Period  Weeks    Status  Achieved        PT Long Term Goals - 06/07/18 8546  PT LONG TERM GOAL #1   Title  Patient will ambulate 60 ft with least assistive AD and prosthesis to improve mobility around home and increase independence.     Baseline  8/14: requires use of // bars 8/28: 30 ft with RW and CGA 9/17: 74 ft x2 with CGA and RW     Time  4    Period  Weeks    Status  Achieved      PT LONG TERM GOAL #2   Title  Patient (> 23 years old) will complete five times sit to stand test in < 15 seconds indicating an increased LE strength and improved balance.    Baseline  8/14: 61 seconds 8/28; 42 seconds 9/17: 24 seconds BUE support with LLE 10/10: 17 seconds with excessive BUE support  11/5: 18seconds BUE support; 12/11/17: 12/11/17: 18.5s with BUE support but no AD, 01/22/18: 17.1s BUE support but no AD; 02/26/18: 13.0s with BUE, still unable to perform without UE assistance; 06/07/18: 25.4s with single UE support during first rep only;    Time  12    Period  Weeks    Status  Partially Met    Target Date  08/30/18      PT LONG TERM GOAL #3   Title  Patient will reduce timed up and go to <11 seconds to reduce fall risk and demonstrate improved transfer/gait ability.    Baseline  8/14: unable to perform 8/28: 1 min 30 seconds 9/17: 38 seconds RW 10/10: 25 seconds with RW 11/5: 20 seconds with cane;, 12/11/17: 17.4s with spc; 01/22/18: 14.8s with spc; 02/26/18: 13.0s with spc; 06/07/18: 16.1s with RW;    Time  12    Period  Weeks    Status  Partially Met    Target Date  08/30/18      PT LONG TERM GOAL #4   Title  Patient will increase lower extremity functional scale to >60/80 to demonstrate improved functional mobility and  increased tolerance with ADLs.     Baseline  814: 29/80 8/28: 20/80 10/10: 20/80 11/5: 22/80; 12/11/17: 18/80; 01/22/18: 27/80; 06/07/18: 30/80    Time  12    Period  Weeks    Status  Partially Met    Target Date  08/30/18      PT LONG TERM GOAL #5   Title  Patient will increase BLE gross strength to 4+/5 as to improve functional strength for independent gait, increased standing tolerance and increased ADL ability.    Baseline  8/14: 4-/15 8/28: 4-/5  9/17: L 4-/5 R 4/5      Time  4    Period  Weeks    Status  Achieved      PT LONG TERM GOAL #6   Title  Patient will perform 10 MWT in >.5 m/s for improved mobility and safety with negotiating natural environment    Baseline  8/28: .16 m/s with RW  9/17: .53 m/s;     Time  4    Period  Weeks    Status  Achieved      PT LONG TERM GOAL #7   Title  Patient will perform 10 MWT in >1.0 m/s for improved mobility and safety with negotiating natural environment    Baseline  9/17: .53 m/s 10/10: .63 m/s with RW 11/5: 0.68ms; 12/11/17: 15.33s = 0.65 m/s with spc; 01/22/18: self-selected: 14.4s = 0.69 m/s, fastest: 11.8s = 0.85 m/s; 02/26/18: self-selected: 13.3s = 0.75 m/s, fastest: 10.6s = 0.94 m/s;  06/07/18: self-selected: 14.0s = 0.71 m/s, fastest: 11.7s = 0.85 m/s;    Time  12    Period  Weeks    Status  Partially Met    Target Date  08/30/18      PT LONG TERM GOAL #8   Title  Patient will ambulate 300 ft with least assistive AD and prosthesis to improve mobility around home and increase independence.     Baseline  9/17: 74 ft CGA with RW 10/10 ambulated 280 ft with RW, 70 ft with [facility] 11/5: ambulate 452f on 10/31; 01/22/18: 400', 02/26/18: 500'    Time  12    Period  Weeks    Status  Deferred    Target Date  08/30/18      PT LONG TERM GOAL  #9   TITLE  Patient will ascend/descend 4 stairs with rail assist independently without loss of balance to improve ability to get in/out of home.     Baseline  9/17: unable to negotiate stairs 10/10: ascend  with step to pattern, BUE support, and CGA 11/5: patient demonstrates ability to perform CGA and BUE support    Time  4    Period  Weeks    Status  Achieved      PT LONG TERM GOAL  #10   TITLE  Patient will ascend/descend 4 stairs with rail assist independently with reciprocating pattern without loss of balance to improve ability to get in/out of home.     Baseline  11/5: step to pattern BUE support CGA; 12/11/17: BUE heavy support and reciprocal pattern;    Time  4    Period  Weeks    Status  Achieved      PT LONG TERM GOAL  #11   TITLE  Patient will ambulate >[id number] 71m63mwith LRAD to demonstrate improved community ambulation and functional mobility     Baseline  11/5: will complete next session; 01/22/18: 400' in 3 minutes but had to stop secondary to pain in knee; 01/22/18: 400' in 3 minutes limited by R knee pain, 02/26/18: 500' in 3 minutes limited by low back pain; 06/07/18: Deferred due to new prosthetic    Time  12    Period  Weeks    Status  Deferred    Target Date  08/30/18            Plan - 06/07/18 1617    Clinical Impression Statement  Pt demonstrating improvement in some of his outcome measures since they were last updated and decline in others. His 5TSTS increased from 13s to 25.4s however pt is now able to perform 4 out of the 5 reps without UE assistance. Prior to the break he was unable to perform sit to stand from a regular height chair without at least single UE support. It seems like at least part of this improvement is his ability to flex his knee more with the new prosthetic. His 10 meter gait speed and TUG are slightly worse than they were before the break. His LEFS is 30/80 which is essentially unchanged from before. Deferred Six Minute Walk Test today since this is the first full day pt has had his new prosthetic and he is still working on his confidence.Patientencouraged to continue HEP and follow-up as scheduled. Hewill continue to benefit from skilled  physical therapy to improve functional mobility, improve prosthetic use, and increase ambulation to increase LOF and improve overall QOL.    Rehab Potential  Fair    Clinical  Impairments Affecting Rehab Potential  (+) previous independence, motivation to return to walking (-) lives alone, hx of diabetes, limited vision     PT Frequency  2x / week    PT Duration  8 weeks    PT Treatment/Interventions  ADLs/Self Care Home Management;Cryotherapy;Electrical Stimulation;Ultrasound;Traction;Moist Heat;DME Instruction;Gait training;Stair training;Functional mobility training;Therapeutic activities;Therapeutic exercise;Patient/family education;Neuromuscular re-education;Balance training;Prosthetic Training;Wheelchair mobility training;Manual techniques;Manual lymph drainage;Compression bandaging;Taping;Energy conservation;Passive range of motion    PT Next Visit Plan  Progress standing exercises, gait training, strengthening, balance, gait training with new prosthetic    PT Home Exercise Plan  see [id number] on [employer], spc ambulation    Consulted and Agree with Plan of Care  Patient       Patient will benefit from skilled therapeutic intervention in order to improve the following deficits and impairments:  Abnormal gait, Decreased activity tolerance, Decreased balance, Decreased knowledge of precautions, Decreased endurance, Decreased knowledge of use of DME, Decreased mobility, Decreased range of motion, Difficulty walking, Decreased strength, Increased edema, Impaired flexibility, Impaired perceived functional ability, Prosthetic Dependency, Postural dysfunction, Improper body mechanics, Pain  Visit Diagnosis: Muscle weakness (generalized) - Plan: PT plan of care cert/re-cert  Unsteadiness on feet - Plan: PT plan of care cert/re-cert  Other abnormalities of gait and mobility - Plan: PT plan of care cert/re-cert     Problem List Patient Active Problem List   Diagnosis Date Noted  . Abnormality  of gait 10/12/2017  . Poorly controlled type 2 diabetes mellitus with peripheral neuropathy ([place])   . Flatulence   . Hypomagnesemia   . Unilateral complete BKA, right, sequela ([place])   . Benign essential HTN   . Hypoalbuminemia due to protein-calorie malnutrition ([place])   . S/P below knee amputation, right ([place]) 04/12/2017  . Acute blood loss anemia   . Other encephalopathy 04/08/2017  . Encephalopathy 04/08/2017  . Altered mental status 04/08/2017  . Labile blood pressure   . Labile blood glucose   . Drug induced constipation   . Stage 3 chronic kidney disease ([place])   . Bacteremia   . S/P unilateral BKA (below knee amputation), right ([place]) 04/04/2017  . PAD (peripheral artery disease) ([place])   . Type 2 diabetes mellitus with right diabetic foot ulcer ([place])   . Post-operative pain   . Legally blind   . Upper GI bleed   . Streptococcal bacteremia 04/01/2017  . Hypokalemia 03/30/2017  . Uncontrolled type 2 diabetes mellitus with hyperglycemia, with long-term current use of insulin ([place]) 03/30/2017  . Type 2 diabetes mellitus with peripheral neuropathy ([place]) 03/30/2017  . AKI (acute kidney injury) ([place]) 03/30/2017  . CKD stage 3 due to type 2 diabetes mellitus ([place]) 03/30/2017  . Sepsis ([place]) 03/30/2017  . Heart murmur 11/22/2016  . Hyperlipidemia associated with type 2 diabetes mellitus ([employer]) 11/22/2016  . Obstructive sleep apnea syndrome 11/22/2016  . Essential hypertension 12/17/2012   Phillips Grout PT, DPT, GCS  , 06/07/2018, 4:32 PM  [place] MAIN [facility] SERVICES [address] [place], [place], [address] Phone: [phone]   Fax:  [phone]  Name: Trevor [name] MRN: [id number] Date of Birth: [birth year]

## 2018-06-19 ENCOUNTER — Other Ambulatory Visit: Payer: Self-pay

## 2018-06-19 ENCOUNTER — Ambulatory Visit: Payer: Medicare Other | Attending: Physical Medicine & Rehabilitation

## 2018-06-19 DIAGNOSIS — R2689 Other abnormalities of gait and mobility: Secondary | ICD-10-CM

## 2018-06-19 DIAGNOSIS — S88111S Complete traumatic amputation at level between knee and ankle, right lower leg, sequela: Secondary | ICD-10-CM | POA: Diagnosis present

## 2018-06-19 DIAGNOSIS — R2681 Unsteadiness on feet: Secondary | ICD-10-CM | POA: Diagnosis present

## 2018-06-19 DIAGNOSIS — M6281 Muscle weakness (generalized): Secondary | ICD-10-CM | POA: Diagnosis not present

## 2018-06-19 NOTE — Therapy (Signed)
[place] MAIN [facility] SERVICES [address] [place], [place], [address] Phone: [phone]   Fax:  [phone]  Physical Therapy Treatment  Patient Details  Name: Trevor [name] MRN: [id number] Date of Birth: Nov 25, 1955 Referring Provider (PT): Jamse Arn, DR   Encounter Date: 06/19/2018  PT End of Session - 06/19/18 1443    Visit Number  5    Number of Visits  73    Date for PT Re-Evaluation  08/30/18    Authorization Type  Eval 08/23/17; Last goals 06/07/18    PT Start Time  1440    PT Stop Time  1530    PT Time Calculation (min)  50 min    Equipment Utilized During Treatment  Gait belt   Rt BKA prosthesis   Activity Tolerance  Patient tolerated treatment well    Behavior During Therapy  WFL for tasks assessed/performed       Past Medical History:  Diagnosis Date  . Diabetes mellitus without complication ([place])   . Heart murmur   . Hyperlipidemia   . Hypertension   . Sleep apnea     Past Surgical History:  Procedure Laterality Date  . AMPUTATION Right 03/31/2017   Procedure: RIGHT FOOT 1ST AND 2ND RAY AMPUTATION;  Surgeon: Newt Minion, MD;  Location: [facility];  Service: Orthopedics;  Laterality: Right;  . AMPUTATION Right 04/01/2017   Procedure: AMPUTATION BELOW KNEE;  Surgeon: Newt Minion, MD;  Location: [place];  Service: Orthopedics;  Laterality: Right;  . COLONOSCOPY WITH PROPOFOL N/A 10/02/2017   Procedure: COLONOSCOPY WITH PROPOFOL;  Surgeon: [name], MD;  Location: [facility] ENDOSCOPY;  Service: Gastroenterology;  Laterality: N/A;  . CORNEAL TRANSPLANT      There were no vitals filed for this visit.  Subjective Assessment - 06/19/18 1442    Subjective  Pt doing well this date. He has not been using his new prosthetic leg consistently at home because it is very tight and hard for him to get it on/off. He has not been using it to walk around the house. No pain reported today. No specific questions or concerns at this  time.     Pertinent History  62 year old right-handed male with history of diabetes mellitus, hypertension, legally blind, and CKD presenting for BKA 04/01/17. Patient wears contacts and can see better now.  Patient had two surgeries, one was a couple toes then they decided to go below the knee. 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 Has ambulated with RW in hospital with PT. Patient has had home health therapy and was in inpatient rehab about a month. Patient reports he was mainly sitting down, with occasional walking, last home health therapy in May. Patient reports some standing up but no exercises. Patient lives alone in the house and is scared they might fall. Patient received prosthetic leg Tuesday of last week.    Limitations  Standing;Walking;House hold activities;Other (comment)    How long can you stand comfortably?  10-15 minutes    How long can you walk comfortably?  10-15 minutes    Patient Stated Goals  going up steps and driving car.     Currently in Pain?  No/denies          TREATMENT   Therapeutic Exercise: Sit to stand from low mat table without UE support x 10 Sit to stand from low mat table  with LLE on 5" step, no UE support x 5; Supine R SLR 2 x 10; Hooklying bridges with LLE slightly extended 2 x 10; Hooklying clams with manual resistance 2 x 10; Hooklying adductor squeeze with manual resistance 2 x 10; L sidelying R hip abduction 2 x 10; Quantum leg press 105# x 15, pt reports "a little knee pain." Attempted 120# initially but it is too much resistance for patient;   Gait Training Gait trainingin [id number] with rolling walker progressing to single point cane in LUE with new R prosthetic leg. Performed horizontal head turns andgait speed changes.One seatedrest break required.No R knee or residual limb pain during ambulation except when attempting to hold single point cane off the  ground.   Pt educated throughout session about proper posture and technique with exercises. Improved exercise technique, movement at target joints, use of target muscles after min to mod verbal, visual, tactile cues.   Pt is able to progress his ambulate back to his pre-COVID distance today. He is also able to progress from a rolling walker to a single point cane and even ambulate a few steps without assistive device. However without support from the single point cane he does experience some R medial knee pain. He completes all exercises as instructed and demonstrates improved strength with sit to stand without using his UE. Re-initiated leg press today but had to decrease in weight compared to his pre-COVID resistance. Pt encouraged to follow-up with his prosthetist to discuss the excessively tight fit. Pt will need a progress note at next visit and will consider performing 6MWT. Hewill continue to benefit from skilled physical therapy to improve functional mobility, improve prosthetic use, and increase ambulation to increase LOF and improve overall QOL.                       PT Short Term Goals - 06/07/18 1617      PT SHORT TERM GOAL #1   Title  Patient will be independent in home exercise program to improve strength/mobility for better functional independence with ADLs.    Baseline  HEP given, 12/11/17: Performing daily:     Time  2    Period  Weeks    Status  Achieved      PT SHORT TERM GOAL #2   Title  Patient will don/doff prosthesis independently to allow for increased mobility in home.     Baseline  8/14: requires PT to guide/direct through process 8/28: independent    Time  2    Period  Weeks    Status  Achieved      PT SHORT TERM GOAL #3   Title  Patient will ambulate 10 meters with least assistive device and prosthesis to improve mobility in home.     Baseline  8/14: not walking outside of // bars yet  8/28: 63 seconds with RW and CGA     Time  2     Period  Weeks    Status  Achieved      PT SHORT TERM GOAL #4   Title  Patient will perform STS with single UE support to decrease reliance upon UE's for stability     Baseline  patient requires BUE support 11/5: 1 UR support    Time  2    Period  Weeks    Status  Achieved        PT Long Term Goals - 06/07/18 1618      PT LONG TERM  GOAL #1   Title  Patient will ambulate 60 ft with least assistive AD and prosthesis to improve mobility around home and increase independence.     Baseline  8/14: requires use of // bars 8/28: 30 ft with RW and CGA 9/17: 74 ft x2 with CGA and RW     Time  4    Period  Weeks    Status  Achieved      PT LONG TERM GOAL #2   Title  Patient (> 39 years old) will complete five times sit to stand test in < 15 seconds indicating an increased LE strength and improved balance.    Baseline  8/14: 61 seconds 8/28; 42 seconds 9/17: 24 seconds BUE support with LLE 10/10: 17 seconds with excessive BUE support  11/5: 18seconds BUE support; 12/11/17: 12/11/17: 18.5s with BUE support but no AD, 01/22/18: 17.1s BUE support but no AD; 02/26/18: 13.0s with BUE, still unable to perform without UE assistance; 06/07/18: 25.4s with single UE support during first rep only;    Time  12    Period  Weeks    Status  Partially Met    Target Date  08/30/18      PT LONG TERM GOAL #3   Title  Patient will reduce timed up and go to <11 seconds to reduce fall risk and demonstrate improved transfer/gait ability.    Baseline  8/14: unable to perform 8/28: 1 min 30 seconds 9/17: 38 seconds RW 10/10: 25 seconds with RW 11/5: 20 seconds with cane;, 12/11/17: 17.4s with spc; 01/22/18: 14.8s with spc; 02/26/18: 13.0s with spc; 06/07/18: 16.1s with RW;    Time  12    Period  Weeks    Status  Partially Met    Target Date  08/30/18      PT LONG TERM GOAL #4   Title  Patient will increase lower extremity functional scale to >60/80 to demonstrate improved functional mobility and increased tolerance with  ADLs.     Baseline  814: 29/80 8/28: 20/80 10/10: 20/80 11/5: 22/80; 12/11/17: 18/80; 01/22/18: 27/80; 06/07/18: 30/80    Time  12    Period  Weeks    Status  Partially Met    Target Date  08/30/18      PT LONG TERM GOAL #5   Title  Patient will increase BLE gross strength to 4+/5 as to improve functional strength for independent gait, increased standing tolerance and increased ADL ability.    Baseline  8/14: 4-/15 8/28: 4-/5  9/17: L 4-/5 R 4/5      Time  4    Period  Weeks    Status  Achieved      PT LONG TERM GOAL #6   Title  Patient will perform 10 MWT in >.5 m/s for improved mobility and safety with negotiating natural environment    Baseline  8/28: .16 m/s with RW  9/17: .53 m/s;     Time  4    Period  Weeks    Status  Achieved      PT LONG TERM GOAL #7   Title  Patient will perform 10 MWT in >1.0 m/s for improved mobility and safety with negotiating natural environment    Baseline  9/17: .53 m/s 10/10: .63 m/s with RW 11/5: 0.1ms; 12/11/17: 15.33s = 0.65 m/s with spc; 01/22/18: self-selected: 14.4s = 0.69 m/s, fastest: 11.8s = 0.85 m/s; 02/26/18: self-selected: 13.3s = 0.75 m/s, fastest: 10.6s = 0.94 m/s; 06/07/18: self-selected: 14.0s =  0.71 m/s, fastest: 11.7s = 0.85 m/s;    Time  12    Period  Weeks    Status  Partially Met    Target Date  08/30/18      PT LONG TERM GOAL #8   Title  Patient will ambulate 300 ft with least assistive AD and prosthesis to improve mobility around home and increase independence.     Baseline  9/17: 74 ft CGA with RW 10/10 ambulated 280 ft with RW, 70 ft with [facility] 11/5: ambulate 465f on 10/31; 01/22/18: 400', 02/26/18: 500'    Time  12    Period  Weeks    Status  Deferred    Target Date  08/30/18      PT LONG TERM GOAL  #9   TITLE  Patient will ascend/descend 4 stairs with rail assist independently without loss of balance to improve ability to get in/out of home.     Baseline  9/17: unable to negotiate stairs 10/10: ascend with step to pattern,  BUE support, and CGA 11/5: patient demonstrates ability to perform CGA and BUE support    Time  4    Period  Weeks    Status  Achieved      PT LONG TERM GOAL  #10   TITLE  Patient will ascend/descend 4 stairs with rail assist independently with reciprocating pattern without loss of balance to improve ability to get in/out of home.     Baseline  11/5: step to pattern BUE support CGA; 12/11/17: BUE heavy support and reciprocal pattern;    Time  4    Period  Weeks    Status  Achieved      PT LONG TERM GOAL  #11   TITLE  Patient will ambulate >[id number] 12m77mwith LRAD to demonstrate improved community ambulation and functional mobility     Baseline  11/5: will complete next session; 01/22/18: 400' in 3 minutes but had to stop secondary to pain in knee; 01/22/18: 400' in 3 minutes limited by R knee pain, 02/26/18: 500' in 3 minutes limited by low back pain; 06/07/18: Deferred due to new prosthetic    Time  12    Period  Weeks    Status  Deferred    Target Date  08/30/18            Plan - 06/19/18 1444    Clinical Impression Statement  Pt is able to progress his ambulate back to his pre-COVID distance today. He is also able to progress from a rolling walker to a single point cane and even ambulate a few steps without assistive device. However without support from the single point cane he does experience some R medial knee pain. He completes all exercises as instructed and demonstrates improved strength with sit to stand without using his UE. Re-initiated leg press today but had to decrease in weight compared to his pre-COVID resistance. Pt encouraged to follow-up with his prosthetist to discuss the excessively tight fit. Pt will need a progress note at next visit and will consider performing 6MWT. Hewill continue to benefit from skilled physical therapy to improve functional mobility, improve prosthetic use, and increase ambulation to increase LOF and improve overall QOL.    Rehab Potential   Fair    Clinical Impairments Affecting Rehab Potential  (+) previous independence, motivation to return to walking (-) lives alone, hx of diabetes, limited vision     PT Frequency  2x / week    PT Duration  8 weeks    PT Treatment/Interventions  ADLs/Self Care Home Management;Cryotherapy;Electrical Stimulation;Ultrasound;Traction;Moist Heat;DME Instruction;Gait training;Stair training;Functional mobility training;Therapeutic activities;Therapeutic exercise;Patient/family education;Neuromuscular re-education;Balance training;Prosthetic Training;Wheelchair mobility training;Manual techniques;Manual lymph drainage;Compression bandaging;Taping;Energy conservation;Passive range of motion    PT Next Visit Plan  Progress note, 6MWT?, progress standing exercises, gait training, strengthening, balance, gait training with new prosthetic    PT Home Exercise Plan  see [id number] on medbridge, spc ambulation    Consulted and Agree with Plan of Care  Patient       Patient will benefit from skilled therapeutic intervention in order to improve the following deficits and impairments:  Abnormal gait, Decreased activity tolerance, Decreased balance, Decreased knowledge of precautions, Decreased endurance, Decreased knowledge of use of DME, Decreased mobility, Decreased range of motion, Difficulty walking, Decreased strength, Increased edema, Impaired flexibility, Impaired perceived functional ability, Prosthetic Dependency, Postural dysfunction, Improper body mechanics, Pain  Visit Diagnosis: Muscle weakness (generalized)  Unsteadiness on feet  Other abnormalities of gait and mobility     Problem List Patient Active Problem List   Diagnosis Date Noted  . Abnormality of gait 10/12/2017  . Poorly controlled type 2 diabetes mellitus with peripheral neuropathy ([place])   . Flatulence   . Hypomagnesemia   . Unilateral complete BKA, right, sequela ([place])   . Benign essential HTN   . Hypoalbuminemia due to  protein-calorie malnutrition ([place])   . S/P below knee amputation, right ([place]) 04/12/2017  . Acute blood loss anemia   . Other encephalopathy 04/08/2017  . Encephalopathy 04/08/2017  . Altered mental status 04/08/2017  . Labile blood pressure   . Labile blood glucose   . Drug induced constipation   . Stage 3 chronic kidney disease ([place])   . Bacteremia   . S/P unilateral BKA (below knee amputation), right ([place]) 04/04/2017  . PAD (peripheral artery disease) ([place])   . Type 2 diabetes mellitus with right diabetic foot ulcer ([place])   . Post-operative pain   . Legally blind   . Upper GI bleed   . Streptococcal bacteremia 04/01/2017  . Hypokalemia 03/30/2017  . Uncontrolled type 2 diabetes mellitus with hyperglycemia, with long-term current use of insulin ([place]) 03/30/2017  . Type 2 diabetes mellitus with peripheral neuropathy ([place]) 03/30/2017  . AKI (acute kidney injury) ([place]) 03/30/2017  . CKD stage 3 due to type 2 diabetes mellitus ([place]) 03/30/2017  . Sepsis ([place]) 03/30/2017  . Heart murmur 11/22/2016  . Hyperlipidemia associated with type 2 diabetes mellitus ([place]) 11/22/2016  . Obstructive sleep apnea syndrome 11/22/2016  . Essential hypertension 12/17/2012   Phillips Grout PT, DPT, GCS  Huprich,Jason 06/19/2018, 3:59 PM  [place] MAIN [facility] SERVICES [address] [place], [place], [address] Phone: [phone]   Fax:  [phone]  Name: Trevor [name] MRN: [id number] Date of Birth: 12/25/1955

## 2018-06-22 ENCOUNTER — Ambulatory Visit: Payer: Medicare Other

## 2018-06-22 ENCOUNTER — Other Ambulatory Visit: Payer: Self-pay

## 2018-06-22 VITALS — BP 130/70 | HR 64

## 2018-06-22 DIAGNOSIS — M6281 Muscle weakness (generalized): Secondary | ICD-10-CM

## 2018-06-22 DIAGNOSIS — R2681 Unsteadiness on feet: Secondary | ICD-10-CM

## 2018-06-22 NOTE — Therapy (Signed)
[place] MAIN [facility] SERVICES [address] [place], [place], [address] Phone: [phone]   Fax:  [phone]  Physical Therapy Progress Note   Dates of reporting period  03/07/18   to   06/22/18  Patient Details  Name: Trevor [name] MRN: [id number] Date of Birth: 1955/02/09 Referring Provider (PT): Jamse Arn, DR   Encounter Date: 06/22/2018  PT End of Session - 06/22/18 1152    Visit Number  50    Number of Visits  55    Date for PT Re-Evaluation  08/30/18    Authorization Type  Eval 08/23/17; Last goals 06/07/18    PT Start Time  1110    PT Stop Time  1153    PT Time Calculation (min)  43 min    Equipment Utilized During Treatment  Gait belt   Rt BKA prosthesis   Activity Tolerance  Patient tolerated treatment well    Behavior During Therapy  WFL for tasks assessed/performed       Past Medical History:  Diagnosis Date  . Diabetes mellitus without complication ([place])   . Heart murmur   . Hyperlipidemia   . Hypertension   . Sleep apnea     Past Surgical History:  Procedure Laterality Date  . AMPUTATION Right 03/31/2017   Procedure: RIGHT FOOT 1ST AND 2ND RAY AMPUTATION;  Surgeon: Newt Minion, MD;  Location: [place];  Service: Orthopedics;  Laterality: Right;  . AMPUTATION Right 04/01/2017   Procedure: AMPUTATION BELOW KNEE;  Surgeon: Newt Minion, MD;  Location: [place];  Service: Orthopedics;  Laterality: Right;  . COLONOSCOPY WITH PROPOFOL N/A 10/02/2017   Procedure: COLONOSCOPY WITH PROPOFOL;  Surgeon: [name], MD;  Location: [facility] ENDOSCOPY;  Service: Gastroenterology;  Laterality: N/A;  . CORNEAL TRANSPLANT      Vitals:   06/22/18 1111  BP: 130/70  Pulse: 64  SpO2: 99%    Subjective Assessment - 06/22/18 1111    Subjective  Pt doing well this date. His prosthetic is still very tight and hard for him to get it on/off. He has not been using it to walk around the house. No pain reported today. No specific  questions or concerns at this time.    Pertinent History  63 year old right-handed male with history of diabetes mellitus, hypertension, legally blind, and CKD presenting for BKA 04/01/17. Patient wears contacts and can see better now.  Patient had two surgeries, one was a couple toes then they decided to go below the knee. 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 Has ambulated with RW in hospital with PT. Patient has had home health therapy and was in inpatient rehab about a month. Patient reports he was mainly sitting down, with occasional walking, last home health therapy in May. Patient reports some standing up but no exercises. Patient lives alone in the house and is scared they might fall. Patient received prosthetic leg Tuesday of last week.    Limitations  Standing;Walking;House hold activities;Other (comment)    How long can you stand comfortably?  10-15 minutes    How long can you walk comfortably?  10-15 minutes    Patient Stated Goals  going up steps and driving car.     Currently in Pain?  No/denies           TREATMENT   Therapeutic Exercise: Quantum leg press 120# 2 x 15,  pt reports "a little knee pain" but it doesn't limit his performance;  Alternating 6" step taps without UE support x 10 each; Alternating 6" step-ups with faded UE support x 10 on each side; In // bars LLE on 6" step using SAEBO for dynamic reaching alternating UE to encourage weight shifting to RLE and R single leg stability x multiple bouts; Tandem balance on 1/2 foam roll (flat side up) alternating forward LE 30s x 2 each;   Gait Training Gait trainingin [id number]' with rolling walker progressing to single point cane in LUE with new R prosthetic leg. Pt was able to progress to no cane for approximately 150' but had to stop due to mild increase in R knee pain. Performed horizontal/vertical head turns, gait speed changes, and quick  stops.Two seatedrest break required.No R knee or residual limb pain during ambulation except when attempting to hold single point cane off the ground.   Pt educated throughout session about proper posture and technique with exercises. Improved exercise technique, movement at target joints, use of target muscles after min to mod verbal, visual, tactile cues.   Pt demonstrated improvement in some of his outcome measures and a decline in others when they were last performed on 06/07/18. His 5TSTS increased from 13s to 25.4s however pt is now able to perform 4 out of the 5 reps without UE assistance. Prior to the break he was unable to perform sit to stand from a regular height chair without at least single UE support. It seems like at least part of this improvement is his ability to flex his knee more with the new prosthetic. His 10 meter gait speed and TUG are slightly worse than prior to break. His LEFS is 30/80 which is essentially unchanged. Deferred Six Minute Walk Test until pt is fully comfortable with his new prostehtic.Patientencouraged to continue HEP and follow-up as scheduled. Hewill continue to benefit from skilled physical therapy to improve functional mobility, improve prosthetic use, and increase ambulation to increase LOF and improve overall QOL.                       PT Short Term Goals - 06/07/18 1617      PT SHORT TERM GOAL #1   Title  Patient will be independent in home exercise program to improve strength/mobility for better functional independence with ADLs.    Baseline  HEP given, 12/11/17: Performing daily:     Time  2    Period  Weeks    Status  Achieved      PT SHORT TERM GOAL #2   Title  Patient will don/doff prosthesis independently to allow for increased mobility in home.     Baseline  8/14: requires PT to guide/direct through process 8/28: independent    Time  2    Period  Weeks    Status  Achieved      PT SHORT TERM GOAL #3   Title   Patient will ambulate 10 meters with least assistive device and prosthesis to improve mobility in home.     Baseline  8/14: not walking outside of // bars yet  8/28: 63 seconds with RW and CGA     Time  2    Period  Weeks    Status  Achieved      PT SHORT TERM GOAL #4   Title  Patient will perform STS with single UE support to decrease reliance upon UE's for stability  Baseline  patient requires BUE support 11/5: 1 UR support    Time  2    Period  Weeks    Status  Achieved        PT Long Term Goals - 06/07/18 1618      PT LONG TERM GOAL #1   Title  Patient will ambulate 60 ft with least assistive AD and prosthesis to improve mobility around home and increase independence.     Baseline  8/14: requires use of // bars 8/28: 30 ft with RW and CGA 9/17: 74 ft x2 with CGA and RW     Time  4    Period  Weeks    Status  Achieved      PT LONG TERM GOAL #2   Title  Patient (> 71 years old) will complete five times sit to stand test in < 15 seconds indicating an increased LE strength and improved balance.    Baseline  8/14: 61 seconds 8/28; 42 seconds 9/17: 24 seconds BUE support with LLE 10/10: 17 seconds with excessive BUE support  11/5: 18seconds BUE support; 12/11/17: 12/11/17: 18.5s with BUE support but no AD, 01/22/18: 17.1s BUE support but no AD; 02/26/18: 13.0s with BUE, still unable to perform without UE assistance; 06/07/18: 25.4s with single UE support during first rep only;    Time  12    Period  Weeks    Status  Partially Met    Target Date  08/30/18      PT LONG TERM GOAL #3   Title  Patient will reduce timed up and go to <11 seconds to reduce fall risk and demonstrate improved transfer/gait ability.    Baseline  8/14: unable to perform 8/28: 1 min 30 seconds 9/17: 38 seconds RW 10/10: 25 seconds with RW 11/5: 20 seconds with cane;, 12/11/17: 17.4s with spc; 01/22/18: 14.8s with spc; 02/26/18: 13.0s with spc; 06/07/18: 16.1s with RW;    Time  12    Period  Weeks    Status   Partially Met    Target Date  08/30/18      PT LONG TERM GOAL #4   Title  Patient will increase lower extremity functional scale to >60/80 to demonstrate improved functional mobility and increased tolerance with ADLs.     Baseline  814: 29/80 8/28: 20/80 10/10: 20/80 11/5: 22/80; 12/11/17: 18/80; 01/22/18: 27/80; 06/07/18: 30/80    Time  12    Period  Weeks    Status  Partially Met    Target Date  08/30/18      PT LONG TERM GOAL #5   Title  Patient will increase BLE gross strength to 4+/5 as to improve functional strength for independent gait, increased standing tolerance and increased ADL ability.    Baseline  8/14: 4-/15 8/28: 4-/5  9/17: L 4-/5 R 4/5      Time  4    Period  Weeks    Status  Achieved      PT LONG TERM GOAL #6   Title  Patient will perform 10 MWT in >.5 m/s for improved mobility and safety with negotiating natural environment    Baseline  8/28: .16 m/s with RW  9/17: .53 m/s;     Time  4    Period  Weeks    Status  Achieved      PT LONG TERM GOAL #7   Title  Patient will perform 10 MWT in >1.0 m/s for improved mobility and safety with  negotiating natural environment    Baseline  9/17: .53 m/s 10/10: .63 m/s with RW 11/5: 0.62ms; 12/11/17: 15.33s = 0.65 m/s with spc; 01/22/18: self-selected: 14.4s = 0.69 m/s, fastest: 11.8s = 0.85 m/s; 02/26/18: self-selected: 13.3s = 0.75 m/s, fastest: 10.6s = 0.94 m/s; 06/07/18: self-selected: 14.0s = 0.71 m/s, fastest: 11.7s = 0.85 m/s;    Time  12    Period  Weeks    Status  Partially Met    Target Date  08/30/18      PT LONG TERM GOAL #8   Title  Patient will ambulate 300 ft with least assistive AD and prosthesis to improve mobility around home and increase independence.     Baseline  9/17: 74 ft CGA with RW 10/10 ambulated 280 ft with RW, [facility]: ambulate [id number] 10/31; 01/22/18: 400', 02/26/18: 500'    Time  12    Period  Weeks    Status  Deferred    Target Date  08/30/18      PT LONG TERM GOAL  #9   TITLE   Patient will ascend/descend 4 stairs with rail assist independently without loss of balance to improve ability to get in/out of home.     Baseline  9/17: unable to negotiate stairs 10/10: ascend with step to pattern, BUE support, and CGA 11/5: patient demonstrates ability to perform CGA and BUE support    Time  4    Period  Weeks    Status  Achieved      PT LONG TERM GOAL  #10   TITLE  Patient will ascend/descend 4 stairs with rail assist independently with reciprocating pattern without loss of balance to improve ability to get in/out of home.     Baseline  11/5: step to pattern BUE support CGA; 12/11/17: BUE heavy support and reciprocal pattern;    Time  4    Period  Weeks    Status  Achieved      PT LONG TERM GOAL  #11   TITLE  Patient will ambulate >[id number] [id number] LRAD to demonstrate improved community ambulation and functional mobility     Baseline  11/5: will complete next session; 01/22/18: 400' in 3 minutes but had to stop secondary to pain in knee; 01/22/18: 400' in 3 minutes limited by R knee pain, 02/26/18: 500' in 3 minutes limited by low back pain; 06/07/18: Deferred due to new prosthetic    Time  12    Period  Weeks    Status  Deferred    Target Date  08/30/18            Plan - 06/22/18 1152    Clinical Impression Statement  Pt demonstrated improvement in some of his outcome measures and a decline in others when they were last performed on 06/07/18. His 5TSTS increased from 13s to 25.4s however pt is now able to perform 4 out of the 5 reps without UE assistance. Prior to the break he was unable to perform sit to stand from a regular height chair without at least single UE support. It seems like at least part of this improvement is his ability to flex his knee more with the new prosthetic. His 10 meter gait speed and TUG are slightly worse than prior to break. His LEFS is 30/80 which is essentially unchanged. Deferred Six Minute Walk Test until pt is fully comfortable with  his new prostehtic. Patient encouraged to continue HEP and follow-up as scheduled. He  will continue to benefit from skilled physical therapy to improve functional mobility, improve prosthetic use, and increase ambulation to increase LOF and improve overall QOL.    Examination-Participation Restrictions  Interpersonal Relationship    Rehab Potential  Fair    Clinical Impairments Affecting Rehab Potential  (+) previous independence, motivation to return to walking (-) lives alone, hx of diabetes, limited vision     PT Frequency  2x / week    PT Duration  8 weeks    PT Treatment/Interventions  ADLs/Self Care Home Management;Cryotherapy;Electrical Stimulation;Ultrasound;Traction;Moist Heat;DME Instruction;Gait training;Stair training;Functional mobility training;Therapeutic activities;Therapeutic exercise;Patient/family education;Neuromuscular re-education;Balance training;Prosthetic Training;Wheelchair mobility training;Manual techniques;Manual lymph drainage;Compression bandaging;Taping;Energy conservation;Passive range of motion    PT Next Visit Plan  6MWT once pt is comfortable with his new prosthetic, progress standing exercises, gait training, strengthening, balance, gait training with new prosthetic    PT Home Exercise Plan  see [id number] on medbridge, spc ambulation    Consulted and Agree with Plan of Care  Patient       Patient will benefit from skilled therapeutic intervention in order to improve the following deficits and impairments:  Abnormal gait, Decreased activity tolerance, Decreased balance, Decreased knowledge of precautions, Decreased endurance, Decreased knowledge of use of DME, Decreased mobility, Decreased range of motion, Difficulty walking, Decreased strength, Increased edema, Impaired flexibility, Impaired perceived functional ability, Prosthetic Dependency, Postural dysfunction, Improper body mechanics, Pain  Visit Diagnosis: Muscle weakness (generalized)  Unsteadiness on  feet     Problem List Patient Active Problem List   Diagnosis Date Noted  . Abnormality of gait 10/12/2017  . Poorly controlled type 2 diabetes mellitus with peripheral neuropathy ([place])   . Flatulence   . Hypomagnesemia   . Unilateral complete BKA, right, sequela ([place])   . Benign essential HTN   . Hypoalbuminemia due to protein-calorie malnutrition ([place])   . S/P below knee amputation, right ([place]) 04/12/2017  . Acute blood loss anemia   . Other encephalopathy 04/08/2017  . Encephalopathy 04/08/2017  . Altered mental status 04/08/2017  . Labile blood pressure   . Labile blood glucose   . Drug induced constipation   . Stage 3 chronic kidney disease ([place])   . Bacteremia   . S/P unilateral BKA (below knee amputation), right ([place]) 04/04/2017  . PAD (peripheral artery disease) ([place])   . Type 2 diabetes mellitus with right diabetic foot ulcer ([place])   . Post-operative pain   . Legally blind   . Upper GI bleed   . Streptococcal bacteremia 04/01/2017  . Hypokalemia 03/30/2017  . Uncontrolled type 2 diabetes mellitus with hyperglycemia, with long-term current use of insulin ([place]) 03/30/2017  . Type 2 diabetes mellitus with peripheral neuropathy ([place]) 03/30/2017  . AKI (acute kidney injury) ([place]) 03/30/2017  . CKD stage 3 due to type 2 diabetes mellitus ([place]) 03/30/2017  . Sepsis ([place]) 03/30/2017  . Heart murmur 11/22/2016  . Hyperlipidemia associated with type 2 diabetes mellitus ([place]) 11/22/2016  . Obstructive sleep apnea syndrome 11/22/2016  . Essential hypertension 12/17/2012   Phillips Grout PT, DPT, GCS  Lelar Farewell 06/22/2018, 1:56 PM  [place] MAIN [facility] SERVICES [address] [place], [place], [address] Phone: [phone]   Fax:  [phone]  Name: Trevor [name] MRN: [id number] Date of Birth: 10/19/1955

## 2018-06-26 ENCOUNTER — Other Ambulatory Visit: Payer: Self-pay

## 2018-06-26 ENCOUNTER — Ambulatory Visit: Payer: Medicare Other

## 2018-06-26 DIAGNOSIS — R2689 Other abnormalities of gait and mobility: Secondary | ICD-10-CM

## 2018-06-26 DIAGNOSIS — M6281 Muscle weakness (generalized): Secondary | ICD-10-CM | POA: Diagnosis not present

## 2018-06-26 DIAGNOSIS — R2681 Unsteadiness on feet: Secondary | ICD-10-CM

## 2018-06-26 DIAGNOSIS — S88111S Complete traumatic amputation at level between knee and ankle, right lower leg, sequela: Secondary | ICD-10-CM

## 2018-06-26 NOTE — Therapy (Signed)
[place] MAIN [facility] SERVICES [address] [place], [place], [address] Phone: [phone]   Fax:  [phone]  Physical Therapy Treatment  Patient Details  Name: Trevor [name] MRN: [id number] Date of Birth: 1955/09/08 Referring Provider (PT): Jamse Arn, DR   Encounter Date: 06/26/2018  PT End of Session - 06/26/18 2118    Visit Number  51    Number of Visits  73    Date for PT Re-Evaluation  08/30/18    Authorization Type  Eval 08/23/17; Last goals 06/07/18    PT Start Time  1315    PT Stop Time  1401    PT Time Calculation (min)  46 min    Equipment Utilized During Treatment  Gait belt   Rt BKA prosthesis   Activity Tolerance  Patient tolerated treatment well    Behavior During Therapy  WFL for tasks assessed/performed       Past Medical History:  Diagnosis Date  . Diabetes mellitus without complication ([place])   . Heart murmur   . Hyperlipidemia   . Hypertension   . Sleep apnea     Past Surgical History:  Procedure Laterality Date  . AMPUTATION Right 03/31/2017   Procedure: RIGHT FOOT 1ST AND 2ND RAY AMPUTATION;  Surgeon: Newt Minion, MD;  Location: [place];  Service: Orthopedics;  Laterality: Right;  . AMPUTATION Right 04/01/2017   Procedure: AMPUTATION BELOW KNEE;  Surgeon: Newt Minion, MD;  Location: [place];  Service: Orthopedics;  Laterality: Right;  . COLONOSCOPY WITH PROPOFOL N/A 10/02/2017   Procedure: COLONOSCOPY WITH PROPOFOL;  Surgeon: [name], MD;  Location: [facility] ENDOSCOPY;  Service: Gastroenterology;  Laterality: N/A;  . CORNEAL TRANSPLANT      There were no vitals filed for this visit.  Subjective Assessment - 06/26/18 2117    Subjective  Patient reports no falls or LOB since last session. Reports compliance with HEP but has been having discomfort with his new prosthesis reporting it is hard for him to get on/off. Gets back pain with walking but no pain at start of session    Pertinent History   63 year old right-handed male with history of diabetes mellitus, hypertension, legally blind, and CKD presenting for BKA 04/01/17. Patient wears contacts and can see better now.  Patient had two surgeries, one was a couple toes then they decided to go below the knee. 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 Has ambulated with RW in hospital with PT. Patient has had home health therapy and was in inpatient rehab about a month. Patient reports he was mainly sitting down, with occasional walking, last home health therapy in May. Patient reports some standing up but no exercises. Patient lives alone in the house and is scared they might fall. Patient received prosthetic leg Tuesday of last week.    Limitations  Standing;Walking;House hold activities;Other (comment)    How long can you stand comfortably?  10-15 minutes    How long can you walk comfortably?  10-15 minutes    Patient Stated Goals  going up steps and driving car.     Currently in Pain?  No/denies      Therapeutic Exercise:  Alternating 6" step taps without UE support x 10 each; 6" step lateral toe taps without UE support x 12 each LE Step with opposite UE reach 10x each LE/UE Ambulate with exaggerated reciprocating UE/LE movements  for improved gait mechanics 8x length of // bars.   seated hamstring stretch on 6" step with 30 second holds x2 each LE   Gait Training Gait training in hall x 900' with single point cane in LUE with new R prosthetic leg progressing to no AD. Pt was able to progress to no cane for approximately 150' but had to stop due to mild increase in R knee pain.Two seated rest break required.  No R knee or residual limb pain during ambulation except when attempting to hold single point cane off the ground.    Head turns with ambulation with SPC; horizontal head turns x 75 ft x2 trials with CGA. Noted deficit with right head turn (more challenging) vertical  head turns x 75 ft x2 trials with CGA. Head turns with PT giving command: (right, left, up, down) in random sequence x 200 ft with CGA. Ambulate with focus on arm swing (bilateral) with no SPC 200 ft with CGA   Education given on use of R arm swing, has arm at side and into extension however does not utilize flexion resulting in tightening and pain in back.     Pt educated throughout session about proper posture and technique with exercises. Improved exercise technique, movement at target joints, use of target muscles after min to mod verbal, visual, tactile cues     Patient presented to physical therapy session with good motivation. He has noticed gait abnormalities related to limited hip extension and decreased arm swing resulting in increased tension of back musculature with pain with prolonged mobility. Patient is challenged with head turns with ambulation but demonstrated improved stability with repetition. Gait mechanics increased with focused techniques and stability/postural alignment. Patientencouraged to continue HEP and follow-up as scheduled. Hewill continue to benefit from skilled physical therapy to improve functional mobility, improve prosthetic use, and increase ambulation to increase LOF and improve overall QOL.                     PT Education - 06/26/18 2118    Education Details  neutral gait mechanics, exercise technique/form    Person(s) Educated  Patient    Methods  Explanation;Demonstration;Tactile cues;Verbal cues    Comprehension  Verbalized understanding;Returned demonstration;Verbal cues required;Tactile cues required       PT Short Term Goals - 06/07/18 1617      PT SHORT TERM GOAL #1   Title  Patient will be independent in home exercise program to improve strength/mobility for better functional independence with ADLs.    Baseline  HEP given, 12/11/17: Performing daily:     Time  2    Period  Weeks    Status  Achieved      PT SHORT TERM  GOAL #2   Title  Patient will don/doff prosthesis independently to allow for increased mobility in home.     Baseline  8/14: requires PT to guide/direct through process 8/28: independent    Time  2    Period  Weeks    Status  Achieved      PT SHORT TERM GOAL #3   Title  Patient will ambulate 10 meters with least assistive device and prosthesis to improve mobility in home.     Baseline  8/14: not walking outside of // bars yet  8/28: 63 seconds with RW and CGA     Time  2    Period  Weeks    Status  Achieved      PT SHORT TERM  GOAL #4   Title  Patient will perform STS with single UE support to decrease reliance upon UE's for stability     Baseline  patient requires BUE support 11/5: 1 UR support    Time  2    Period  Weeks    Status  Achieved        PT Long Term Goals - 06/07/18 1618      PT LONG TERM GOAL #1   Title  Patient will ambulate 60 ft with least assistive AD and prosthesis to improve mobility around home and increase independence.     Baseline  8/14: requires use of // bars 8/28: 30 ft with RW and CGA 9/17: 74 ft x2 with CGA and RW     Time  4    Period  Weeks    Status  Achieved      PT LONG TERM GOAL #2   Title  Patient (> 37 years old) will complete five times sit to stand test in < 15 seconds indicating an increased LE strength and improved balance.    Baseline  8/14: 61 seconds 8/28; 42 seconds 9/17: 24 seconds BUE support with LLE 10/10: 17 seconds with excessive BUE support  11/5: 18seconds BUE support; 12/11/17: 12/11/17: 18.5s with BUE support but no AD, 01/22/18: 17.1s BUE support but no AD; 02/26/18: 13.0s with BUE, still unable to perform without UE assistance; 06/07/18: 25.4s with single UE support during first rep only;    Time  12    Period  Weeks    Status  Partially Met    Target Date  08/30/18      PT LONG TERM GOAL #3   Title  Patient will reduce timed up and go to <11 seconds to reduce fall risk and demonstrate improved transfer/gait ability.     Baseline  8/14: unable to perform 8/28: 1 min 30 seconds 9/17: 38 seconds RW 10/10: 25 seconds with RW 11/5: 20 seconds with cane;, 12/11/17: 17.4s with spc; 01/22/18: 14.8s with spc; 02/26/18: 13.0s with spc; 06/07/18: 16.1s with RW;    Time  12    Period  Weeks    Status  Partially Met    Target Date  08/30/18      PT LONG TERM GOAL #4   Title  Patient will increase lower extremity functional scale to >60/80 to demonstrate improved functional mobility and increased tolerance with ADLs.     Baseline  814: 29/80 8/28: 20/80 10/10: 20/80 11/5: 22/80; 12/11/17: 18/80; 01/22/18: 27/80; 06/07/18: 30/80    Time  12    Period  Weeks    Status  Partially Met    Target Date  08/30/18      PT LONG TERM GOAL #5   Title  Patient will increase BLE gross strength to 4+/5 as to improve functional strength for independent gait, increased standing tolerance and increased ADL ability.    Baseline  8/14: 4-/15 8/28: 4-/5  9/17: L 4-/5 R 4/5      Time  4    Period  Weeks    Status  Achieved      PT LONG TERM GOAL #6   Title  Patient will perform 10 MWT in >.5 m/s for improved mobility and safety with negotiating natural environment    Baseline  8/28: .16 m/s with RW  9/17: .53 m/s;     Time  4    Period  Weeks    Status  Achieved  PT LONG TERM GOAL #7   Title  Patient will perform 10 MWT in >1.0 m/s for improved mobility and safety with negotiating natural environment    Baseline  9/17: .53 m/s 10/10: .63 m/s with RW 11/5: 0.8ms; 12/11/17: 15.33s = 0.65 m/s with spc; 01/22/18: self-selected: 14.4s = 0.69 m/s, fastest: 11.8s = 0.85 m/s; 02/26/18: self-selected: 13.3s = 0.75 m/s, fastest: 10.6s = 0.94 m/s; 06/07/18: self-selected: 14.0s = 0.71 m/s, fastest: 11.7s = 0.85 m/s;    Time  12    Period  Weeks    Status  Partially Met    Target Date  08/30/18      PT LONG TERM GOAL #8   Title  Patient will ambulate 300 ft with least assistive AD and prosthesis to improve mobility around home and increase  independence.     Baseline  9/17: 74 ft CGA with RW 10/10 ambulated 280 ft with RW, 70 ft with [facility] [place]11/5: ambulate [id number] 10/31; 01/22/18: 400', 02/26/18: 500'    Time  12    Period  Weeks    Status  Deferred    Target Date  08/30/18      PT LONG TERM GOAL  #9   TITLE  Patient will ascend/descend 4 stairs with rail assist independently without loss of balance to improve ability to get in/out of home.     Baseline  9/17: unable to negotiate stairs 10/10: ascend with step to pattern, BUE support, and CGA 11/5: patient demonstrates ability to perform CGA and BUE support    Time  4    Period  Weeks    Status  Achieved      PT LONG TERM GOAL  #10   TITLE  Patient will ascend/descend 4 stairs with rail assist independently with reciprocating pattern without loss of balance to improve ability to get in/out of home.     Baseline  11/5: step to pattern BUE support CGA; 12/11/17: BUE heavy support and reciprocal pattern;    Time  4    Period  Weeks    Status  Achieved      PT LONG TERM GOAL  #11   TITLE  Patient will ambulate >[id number] LRAD to demonstrate improved community ambulation and functional mobility     Baseline  11/5: will complete next session; 01/22/18: 400' in 3 minutes but had to stop secondary to pain in knee; 01/22/18: 400' in 3 minutes limited by R knee pain, 02/26/18: 500' in 3 minutes limited by low back pain; 06/07/18: Deferred due to new prosthetic    Time  12    Period  Weeks    Status  Deferred    Target Date  08/30/18            Plan - 06/26/18 2122    Clinical Impression Statement  Patient presented to physical therapy session with good motivation. he has noticed gait abnormalities related to limited hip extension and decreased arm swing resulting in increased tension of back musuclature with pain with prolonged mobility. Patient is challenged with head turns with ambulation but demonstrated improved stability with reptition. Gait mechanics increased with  focused techniques and stability/postural alignment. Patient encouraged to continue HEP and follow up as scheduled. He will continue to benefit from skilled physical therapy to improve functional mobility, improve prosthetic use and increase ambulation to increase LOF and improve overall QOL.    Examination-Participation Restrictions  Interpersonal Relationship    Rehab Potential  Fair  Clinical Impairments Affecting Rehab Potential  (+) previous independence, motivation to return to walking (-) lives alone, hx of diabetes, limited vision     PT Frequency  2x / week    PT Duration  8 weeks    PT Treatment/Interventions  ADLs/Self Care Home Management;Cryotherapy;Electrical Stimulation;Ultrasound;Traction;Moist Heat;DME Instruction;Gait training;Stair training;Functional mobility training;Therapeutic activities;Therapeutic exercise;Patient/family education;Neuromuscular re-education;Balance training;Prosthetic Training;Wheelchair mobility training;Manual techniques;Manual lymph drainage;Compression bandaging;Taping;Energy conservation;Passive range of motion    PT Next Visit Plan  6MWT once pt is comfortable with his new prosthetic, progress standing exercises, gait training, strengthening, balance, gait training with new prosthetic    PT Home Exercise Plan  see [id number] on medbridge, spc ambulation    Consulted and Agree with Plan of Care  Patient       Patient will benefit from skilled therapeutic intervention in order to improve the following deficits and impairments:  Abnormal gait, Decreased activity tolerance, Decreased balance, Decreased knowledge of precautions, Decreased endurance, Decreased knowledge of use of DME, Decreased mobility, Decreased range of motion, Difficulty walking, Decreased strength, Increased edema, Impaired flexibility, Impaired perceived functional ability, Prosthetic Dependency, Postural dysfunction, Improper body mechanics, Pain  Visit Diagnosis: 1. Muscle weakness  (generalized)   2. Unsteadiness on feet   3. Other abnormalities of gait and mobility   4. Unilateral complete BKA, right, sequela [facility])        Problem List Patient Active Problem List   Diagnosis Date Noted  . Abnormality of gait 10/12/2017  . Poorly controlled type 2 diabetes mellitus with peripheral neuropathy ([place])   . Flatulence   . Hypomagnesemia   . Unilateral complete BKA, right, sequela ([place])   . Benign essential HTN   . Hypoalbuminemia due to protein-calorie malnutrition ([place])   . S/P below knee amputation, right ([place]) 04/12/2017  . Acute blood loss anemia   . Other encephalopathy 04/08/2017  . Encephalopathy 04/08/2017  . Altered mental status 04/08/2017  . Labile blood pressure   . Labile blood glucose   . Drug induced constipation   . Stage 3 chronic kidney disease ([place])   . Bacteremia   . S/P unilateral BKA (below knee amputation), right ([place]) 04/04/2017  . PAD (peripheral artery disease) ([place])   . Type 2 diabetes mellitus with right diabetic foot ulcer ([place])   . Post-operative pain   . Legally blind   . Upper GI bleed   . Streptococcal bacteremia 04/01/2017  . Hypokalemia 03/30/2017  . Uncontrolled type 2 diabetes mellitus with hyperglycemia, with long-term current use of insulin ([place]) 03/30/2017  . Type 2 diabetes mellitus with peripheral neuropathy ([place]) 03/30/2017  . AKI (acute kidney injury) ([place]) 03/30/2017  . CKD stage 3 due to type 2 diabetes mellitus ([place]) 03/30/2017  . Sepsis ([place]) 03/30/2017  . Heart murmur 11/22/2016  . Hyperlipidemia associated with type 2 diabetes mellitus ([place]) 11/22/2016  . Obstructive sleep apnea syndrome 11/22/2016  . Essential hypertension 12/17/2012   Janna Arch, PT, DPT   06/26/2018, 9:27 PM  [place] MAIN [facility] SERVICES [address] [place], [place], [address] Phone: [phone]   Fax:  [phone]  Name: Trevor [name] MRN: [id number] Date of Birth:  02-Jan-1956

## 2018-06-28 ENCOUNTER — Other Ambulatory Visit: Payer: Self-pay

## 2018-06-28 ENCOUNTER — Ambulatory Visit: Payer: Medicare Other

## 2018-06-28 DIAGNOSIS — S88111S Complete traumatic amputation at level between knee and ankle, right lower leg, sequela: Secondary | ICD-10-CM

## 2018-06-28 DIAGNOSIS — M6281 Muscle weakness (generalized): Secondary | ICD-10-CM | POA: Diagnosis not present

## 2018-06-28 DIAGNOSIS — R2681 Unsteadiness on feet: Secondary | ICD-10-CM

## 2018-06-28 DIAGNOSIS — R2689 Other abnormalities of gait and mobility: Secondary | ICD-10-CM

## 2018-06-28 NOTE — Therapy (Signed)
[place] MAIN [facility] SERVICES [address] [place], [place], [address] Phone: [phone]   Fax:  [phone]  Physical Therapy Treatment  Patient Details  Name: Trevor [name] MRN: [id number] Date of Birth: 1955-06-05 Referring Provider (PT): Jamse Arn, DR   Encounter Date: 06/28/2018  PT End of Session - 06/28/18 1515    Visit Number  [id number]    Number of Visits  77    Date for PT Re-Evaluation  08/30/18    Authorization Type  Eval 08/23/17; Last goals 06/07/18    PT Start Time  1332    PT Stop Time  1400    PT Time Calculation (min)  28 min    Equipment Utilized During Treatment  Gait belt   Rt BKA prosthesis   Activity Tolerance  Patient tolerated treatment well;Patient limited by fatigue    Behavior During Therapy  WFL for tasks assessed/performed       Past Medical History:  Diagnosis Date  . Diabetes mellitus without complication ([place])   . Heart murmur   . Hyperlipidemia   . Hypertension   . Sleep apnea     Past Surgical History:  Procedure Laterality Date  . AMPUTATION Right 03/31/2017   Procedure: RIGHT FOOT 1ST AND 2ND RAY AMPUTATION;  Surgeon: Newt Minion, MD;  Location: [place];  Service: Orthopedics;  Laterality: Right;  . AMPUTATION Right 04/01/2017   Procedure: AMPUTATION BELOW KNEE;  Surgeon: Newt Minion, MD;  Location: [place];  Service: Orthopedics;  Laterality: Right;  . COLONOSCOPY WITH PROPOFOL N/A 10/02/2017   Procedure: COLONOSCOPY WITH PROPOFOL;  Surgeon: [name], MD;  Location: [facility] ENDOSCOPY;  Service: Gastroenterology;  Laterality: N/A;  . CORNEAL TRANSPLANT      There were no vitals filed for this visit.  Subjective Assessment - 06/28/18 1357    Subjective  Patient late for session. Is feeling stressed and out of breathe today.    Pertinent History  63 year old right-handed male with history of diabetes mellitus, hypertension, legally blind, and CKD presenting for BKA 04/01/17. Patient wears  contacts and can see better now.  Patient had two surgeries, one was a couple toes then they decided to go below the knee. 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 Has ambulated with RW in hospital with PT. Patient has had home health therapy and was in inpatient rehab about a month. Patient reports he was mainly sitting down, with occasional walking, last home health therapy in May. Patient reports some standing up but no exercises. Patient lives alone in the house and is scared they might fall. Patient received prosthetic leg Tuesday of last week.    Limitations  Standing;Walking;House hold activities;Other (comment)    How long can you stand comfortably?  10-15 minutes    How long can you walk comfortably?  10-15 minutes    Patient Stated Goals  going up steps and driving car.     Currently in Pain?  No/denies         Patient arrived late to session limiting session duration. Patient is limited due to being out of breath.    Gait training in hall x 1200' with single point cane in LUE with new R prosthetic leg progressing to no AD. Pt was able to progress to no cane for approximately 150' but had to stop due to mild increase in R knee pain.  One seated rest break required.  No R knee or residual limb pain during ambulation except when attempting to hold single point cane off the ground.    Head turns with ambulation with SPC; horizontal head turns x 75 ft x2 trials with CGA. Noted deficit with right head turn (more challenging) vertical head turns x 75 ft x2 trials with CGA. Head turns with PT giving command: (right, left, up, down) in random sequence x 200 ft with CGA.   Pt educated throughout session about proper posture and technique with exercises. Improved exercise technique, movement at target joints, use of target muscles after min to mod verbal, visual, tactile cues  Patient educated on breathing technique of purse  lipped breathing to improve breathing. Patient reportedly out of breathe however Sp02 functional range as well as HR.   BP: 145/83 Pulse 65 by end of session                        PT Education - 06/28/18 1514    Education Details  exercise technique, gait mechanics    Person(s) Educated  Patient    Methods  Explanation;Demonstration;Tactile cues;Verbal cues    Comprehension  Returned demonstration;Verbal cues required;Verbalized understanding;Tactile cues required       PT Short Term Goals - 06/07/18 1617      PT SHORT TERM GOAL #1   Title  Patient will be independent in home exercise program to improve strength/mobility for better functional independence with ADLs.    Baseline  HEP given, 12/11/17: Performing daily:     Time  2    Period  Weeks    Status  Achieved      PT SHORT TERM GOAL #2   Title  Patient will don/doff prosthesis independently to allow for increased mobility in home.     Baseline  8/14: requires PT to guide/direct through process 8/28: independent    Time  2    Period  Weeks    Status  Achieved      PT SHORT TERM GOAL #3   Title  Patient will ambulate 10 meters with least assistive device and prosthesis to improve mobility in home.     Baseline  8/14: not walking outside of // bars yet  8/28: 63 seconds with RW and CGA     Time  2    Period  Weeks    Status  Achieved      PT SHORT TERM GOAL #4   Title  Patient will perform STS with single UE support to decrease reliance upon UE's for stability     Baseline  patient requires BUE support 11/5: 1 UR support    Time  2    Period  Weeks    Status  Achieved        PT Long Term Goals - 06/07/18 1618      PT LONG TERM GOAL #1   Title  Patient will ambulate 60 ft with least assistive AD and prosthesis to improve mobility around home and increase independence.     Baseline  8/14: requires use of // bars 8/28: 30 ft with RW and CGA 9/17: 74 ft x2 with CGA and RW     Time  4    Period   Weeks    Status  Achieved      PT LONG TERM GOAL #2   Title  Patient (> 54 years old) will complete five times sit to stand test  in < 15 seconds indicating an increased LE strength and improved balance.    Baseline  8/14: 61 seconds 8/28; 42 seconds 9/17: 24 seconds BUE support with LLE 10/10: 17 seconds with excessive BUE support  11/5: 18seconds BUE support; 12/11/17: 12/11/17: 18.5s with BUE support but no AD, 01/22/18: 17.1s BUE support but no AD; 02/26/18: 13.0s with BUE, still unable to perform without UE assistance; 06/07/18: 25.4s with single UE support during first rep only;    Time  12    Period  Weeks    Status  Partially Met    Target Date  08/30/18      PT LONG TERM GOAL #3   Title  Patient will reduce timed up and go to <11 seconds to reduce fall risk and demonstrate improved transfer/gait ability.    Baseline  8/14: unable to perform 8/28: 1 min 30 seconds 9/17: 38 seconds RW 10/10: 25 seconds with RW 11/5: 20 seconds with cane;, 12/11/17: 17.4s with spc; 01/22/18: 14.8s with spc; 02/26/18: 13.0s with spc; 06/07/18: 16.1s with RW;    Time  12    Period  Weeks    Status  Partially Met    Target Date  08/30/18      PT LONG TERM GOAL #4   Title  Patient will increase lower extremity functional scale to >60/80 to demonstrate improved functional mobility and increased tolerance with ADLs.     Baseline  814: 29/80 8/28: 20/80 10/10: 20/80 11/5: 22/80; 12/11/17: 18/80; 01/22/18: 27/80; 06/07/18: 30/80    Time  12    Period  Weeks    Status  Partially Met    Target Date  08/30/18      PT LONG TERM GOAL #5   Title  Patient will increase BLE gross strength to 4+/5 as to improve functional strength for independent gait, increased standing tolerance and increased ADL ability.    Baseline  8/14: 4-/15 8/28: 4-/5  9/17: L 4-/5 R 4/5      Time  4    Period  Weeks    Status  Achieved      PT LONG TERM GOAL #6   Title  Patient will perform 10 MWT in >.5 m/s for improved mobility and safety with  negotiating natural environment    Baseline  8/28: .16 m/s with RW  9/17: .53 m/s;     Time  4    Period  Weeks    Status  Achieved      PT LONG TERM GOAL #7   Title  Patient will perform 10 MWT in >1.0 m/s for improved mobility and safety with negotiating natural environment    Baseline  9/17: .53 m/s 10/10: .63 m/s with RW 11/5: 0.44ms; 12/11/17: 15.33s = 0.65 m/s with spc; 01/22/18: self-selected: 14.4s = 0.69 m/s, fastest: 11.8s = 0.85 m/s; 02/26/18: self-selected: 13.3s = 0.75 m/s, fastest: 10.6s = 0.94 m/s; 06/07/18: self-selected: 14.0s = 0.71 m/s, fastest: 11.7s = 0.85 m/s;    Time  12    Period  Weeks    Status  Partially Met    Target Date  08/30/18      PT LONG TERM GOAL #8   Title  Patient will ambulate 300 ft with least assistive AD and prosthesis to improve mobility around home and increase independence.     Baseline  9/17: 74 ft CGA with RW 10/10 ambulated 280 ft with RW, [facility]11/5: ambulate 4650fon 10/31; 01/22/18: 400', 02/26/18: 500'  Time  12    Period  Weeks    Status  Deferred    Target Date  08/30/18      PT LONG TERM GOAL  #9   TITLE  Patient will ascend/descend 4 stairs with rail assist independently without loss of balance to improve ability to get in/out of home.     Baseline  9/17: unable to negotiate stairs 10/10: ascend with step to pattern, BUE support, and CGA 11/5: patient demonstrates ability to perform CGA and BUE support    Time  4    Period  Weeks    Status  Achieved      PT LONG TERM GOAL  #10   TITLE  Patient will ascend/descend 4 stairs with rail assist independently with reciprocating pattern without loss of balance to improve ability to get in/out of home.     Baseline  11/5: step to pattern BUE support CGA; 12/11/17: BUE heavy support and reciprocal pattern;    Time  4    Period  Weeks    Status  Achieved      PT LONG TERM GOAL  #11   TITLE  Patient will ambulate >1084f during 664m with LRAD to demonstrate improved community  ambulation and functional mobility     Baseline  11/5: will complete next session; 01/22/18: 400' in 3 minutes but had to stop secondary to pain in knee; 01/22/18: 400' in 3 minutes limited by R knee pain, 02/26/18: 500' in 3 minutes limited by low back pain; 06/07/18: Deferred due to new prosthetic    Time  12    Period  Weeks    Status  Deferred    Target Date  08/30/18            Plan - 06/28/18 1517    Clinical Impression Statement  Patient arrived late to session limiting session duration. Session also limited due to patient c/o  being out of breath requiring prolonged seated rest break mid ambulation. Use of purse lipped breathing was helpful, vitals monitored and within functional range throughout session. Patient demonstrated carryover from previous sessions with use of arm swing and step through pattern. Patient had one episode of near LOB but was able to self correct with slight PT assistance with ambulation. He will continue to benefit from skilled physical therapy to improve functional mobility, improve prosthetic use and increase ambulation to increase LOF and improve overall QOL.    Examination-Participation Restrictions  Interpersonal Relationship    Rehab Potential  Fair    Clinical Impairments Affecting Rehab Potential  (+) previous independence, motivation to return to walking (-) lives alone, hx of diabetes, limited vision     PT Frequency  2x / week    PT Duration  8 weeks    PT Treatment/Interventions  ADLs/Self Care Home Management;Cryotherapy;Electrical Stimulation;Ultrasound;Traction;Moist Heat;DME Instruction;Gait training;Stair training;Functional mobility training;Therapeutic activities;Therapeutic exercise;Patient/family education;Neuromuscular re-education;Balance training;Prosthetic Training;Wheelchair mobility training;Manual techniques;Manual lymph drainage;Compression bandaging;Taping;Energy conservation;Passive range of motion    PT Next Visit Plan  6MWT once pt is  comfortable with his new prosthetic, progress standing exercises, gait training, strengthening, balance, gait training with new prosthetic    PT Home Exercise Plan  see [id number] on medbridge, spc ambulation    Consulted and Agree with Plan of Care  Patient       Patient will benefit from skilled therapeutic intervention in order to improve the following deficits and impairments:  Abnormal gait, Decreased activity tolerance, Decreased balance, Decreased knowledge of precautions, Decreased endurance, Decreased  knowledge of use of DME, Decreased mobility, Decreased range of motion, Difficulty walking, Decreased strength, Increased edema, Impaired flexibility, Impaired perceived functional ability, Prosthetic Dependency, Postural dysfunction, Improper body mechanics, Pain  Visit Diagnosis: 1. Muscle weakness (generalized)   2. Unsteadiness on feet   3. Other abnormalities of gait and mobility   4. Unilateral complete BKA, right, sequela [facility])        Problem List Patient Active Problem List   Diagnosis Date Noted  . Abnormality of gait 10/12/2017  . Poorly controlled type 2 diabetes mellitus with peripheral neuropathy ([place])   . Flatulence   . Hypomagnesemia   . Unilateral complete BKA, right, sequela ([place])   . Benign essential HTN   . Hypoalbuminemia due to protein-calorie malnutrition ([place])   . S/P below knee amputation, right ([place]) 04/12/2017  . Acute blood loss anemia   . Other encephalopathy 04/08/2017  . Encephalopathy 04/08/2017  . Altered mental status 04/08/2017  . Labile blood pressure   . Labile blood glucose   . Drug induced constipation   . Stage 3 chronic kidney disease ([place])   . Bacteremia   . S/P unilateral BKA (below knee amputation), right ([place]) 04/04/2017  . PAD (peripheral artery disease) ([place])   . Type 2 diabetes mellitus with right diabetic foot ulcer ([place])   . Post-operative pain   . Legally blind   . Upper GI bleed   . Streptococcal bacteremia 04/01/2017   . Hypokalemia 03/30/2017  . Uncontrolled type 2 diabetes mellitus with hyperglycemia, with long-term current use of insulin ([place]) 03/30/2017  . Type 2 diabetes mellitus with peripheral neuropathy ([place]) 03/30/2017  . AKI (acute kidney injury) ([place]) 03/30/2017  . CKD stage 3 due to type 2 diabetes mellitus ([place]) 03/30/2017  . Sepsis ([place]) 03/30/2017  . Heart murmur 11/22/2016  . Hyperlipidemia associated with type 2 diabetes mellitus ([place]) 11/22/2016  . Obstructive sleep apnea syndrome 11/22/2016  . Essential hypertension 12/17/2012   Janna Arch, PT, DPT   06/28/2018, 3:18 PM  [place] MAIN [facility] SERVICES [address] [place], [place], [address] Phone: [phone]   Fax:  [phone]  Name: Trevor [name] MRN: [id number] Date of Birth: January 30, 1955

## 2018-07-03 ENCOUNTER — Other Ambulatory Visit: Payer: Self-pay

## 2018-07-03 ENCOUNTER — Ambulatory Visit: Payer: Medicare Other

## 2018-07-03 DIAGNOSIS — R2681 Unsteadiness on feet: Secondary | ICD-10-CM

## 2018-07-03 DIAGNOSIS — S88111S Complete traumatic amputation at level between knee and ankle, right lower leg, sequela: Secondary | ICD-10-CM

## 2018-07-03 DIAGNOSIS — R2689 Other abnormalities of gait and mobility: Secondary | ICD-10-CM

## 2018-07-03 DIAGNOSIS — M6281 Muscle weakness (generalized): Secondary | ICD-10-CM

## 2018-07-03 NOTE — Therapy (Signed)
[place] MAIN [facility] SERVICES [address] [place], [place], [address] Phone: [phone]   Fax:  [phone]  Physical Therapy Treatment  Patient Details  Name: Trevor [name] MRN: [id number] Date of Birth: Jul 08, 1955 Referring Provider (PT): Jamse Arn, DR   Encounter Date: 07/03/2018  PT End of Session - 07/03/18 1413    Visit Number  [id number]    Number of Visits  79    Date for PT Re-Evaluation  08/30/18    Authorization Type  Eval 08/23/17; Last goals 06/07/18    PT Start Time  1317    PT Stop Time  1401    PT Time Calculation (min)  44 min    Equipment Utilized During Treatment  Gait belt   Rt BKA prosthesis   Activity Tolerance  Patient tolerated treatment well;Patient limited by fatigue    Behavior During Therapy  WFL for tasks assessed/performed       Past Medical History:  Diagnosis Date  . Diabetes mellitus without complication ([place])   . Heart murmur   . Hyperlipidemia   . Hypertension   . Sleep apnea     Past Surgical History:  Procedure Laterality Date  . AMPUTATION Right 03/31/2017   Procedure: RIGHT FOOT 1ST AND 2ND RAY AMPUTATION;  Surgeon: Newt Minion, MD;  Location: [place];  Service: Orthopedics;  Laterality: Right;  . AMPUTATION Right 04/01/2017   Procedure: AMPUTATION BELOW KNEE;  Surgeon: Newt Minion, MD;  Location: [place];  Service: Orthopedics;  Laterality: Right;  . COLONOSCOPY WITH PROPOFOL N/A 10/02/2017   Procedure: COLONOSCOPY WITH PROPOFOL;  Surgeon: [name], MD;  Location: [facility] ENDOSCOPY;  Service: Gastroenterology;  Laterality: N/A;  . CORNEAL TRANSPLANT      There were no vitals filed for this visit.  Subjective Assessment - 07/03/18 1403    Subjective  Patient presents to physical therapy with no pain, no falls or LOB since last session.    Pertinent History  64 year old right-handed male with history of diabetes mellitus, hypertension, legally blind, and CKD presenting for BKA 04/01/17.  Patient wears contacts and can see better now.  Patient had two surgeries, one was a couple toes then they decided to go below the knee. 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 Has ambulated with RW in hospital with PT. Patient has had home health therapy and was in inpatient rehab about a month. Patient reports he was mainly sitting down, with occasional walking, last home health therapy in May. Patient reports some standing up but no exercises. Patient lives alone in the house and is scared they might fall. Patient received prosthetic leg Tuesday of last week.    Limitations  Standing;Walking;House hold activities;Other (comment)    How long can you stand comfortably?  10-15 minutes    How long can you walk comfortably?  10-15 minutes    Patient Stated Goals  going up steps and driving car.     Currently in Pain?  No/denies         Therapeutic Exercise:    6" step lateral toe taps without UE support x 12 each LE; cues for upright posture with decreasing glute drop Ambulate backwards x4 lengths of // bars with CGA and cueing for upright posture for maximal gluteal activation.   seated hamstring stretch on 6" step with 30 second holds x2 each LE Standing balloon taps  reaching inside and outside BOS with focus on stability and reaction timing.   In hallway: Cone negotiation: weaving between 8 cones 2 ft apart  x2 trials, 1 ft apart x 2 trials with patient knocking over one cone with second attempt due to increased need for accurate body mechanics and position in space.  Diamond cone negotiation; PT calling cone color to patient, patient starting in center, ambulating with SPC to cone, turning around cone and returning to center with decreasing time for reactions/commands.  Side step over one cone with SPC, very challenging with patient frequently mistepping over cone.           Pt educated throughout session about proper  posture and technique with exercises. Improved exercise technique, movement at target joints, use of target muscles after min to mod verbal, visual, tactile cues    Vitals: 146/72 sp02 99 pulse 61                        PT Education - 07/03/18 1413    Education Details  exercise technique, negotiation of objects    Person(s) Educated  Patient    Methods  Explanation;Demonstration;Tactile cues;Verbal cues    Comprehension  Verbalized understanding;Returned demonstration;Verbal cues required;Tactile cues required       PT Short Term Goals - 06/07/18 1617      PT SHORT TERM GOAL #1   Title  Patient will be independent in home exercise program to improve strength/mobility for better functional independence with ADLs.    Baseline  HEP given, 12/11/17: Performing daily:     Time  2    Period  Weeks    Status  Achieved      PT SHORT TERM GOAL #2   Title  Patient will don/doff prosthesis independently to allow for increased mobility in home.     Baseline  8/14: requires PT to guide/direct through process 8/28: independent    Time  2    Period  Weeks    Status  Achieved      PT SHORT TERM GOAL #3   Title  Patient will ambulate 10 meters with least assistive device and prosthesis to improve mobility in home.     Baseline  8/14: not walking outside of // bars yet  8/28: 63 seconds with RW and CGA     Time  2    Period  Weeks    Status  Achieved      PT SHORT TERM GOAL #4   Title  Patient will perform STS with single UE support to decrease reliance upon UE's for stability     Baseline  patient requires BUE support 11/5: 1 UR support    Time  2    Period  Weeks    Status  Achieved        PT Long Term Goals - 06/07/18 1618      PT LONG TERM GOAL #1   Title  Patient will ambulate 60 ft with least assistive AD and prosthesis to improve mobility around home and increase independence.     Baseline  8/14: requires use of // bars 8/28: 30 ft with RW and CGA 9/17: 74  ft x2 with CGA and RW     Time  4    Period  Weeks    Status  Achieved      PT LONG TERM GOAL #2   Title  Patient (> 46 years old) will complete five times sit  to stand test in < 15 seconds indicating an increased LE strength and improved balance.    Baseline  8/14: 61 seconds 8/28; 42 seconds 9/17: 24 seconds BUE support with LLE 10/10: 17 seconds with excessive BUE support  11/5: 18seconds BUE support; 12/11/17: 12/11/17: 18.5s with BUE support but no AD, 01/22/18: 17.1s BUE support but no AD; 02/26/18: 13.0s with BUE, still unable to perform without UE assistance; 06/07/18: 25.4s with single UE support during first rep only;    Time  12    Period  Weeks    Status  Partially Met    Target Date  08/30/18      PT LONG TERM GOAL #3   Title  Patient will reduce timed up and go to <11 seconds to reduce fall risk and demonstrate improved transfer/gait ability.    Baseline  8/14: unable to perform 8/28: 1 min 30 seconds 9/17: 38 seconds RW 10/10: 25 seconds with RW 11/5: 20 seconds with cane;, 12/11/17: 17.4s with spc; 01/22/18: 14.8s with spc; 02/26/18: 13.0s with spc; 06/07/18: 16.1s with RW;    Time  12    Period  Weeks    Status  Partially Met    Target Date  08/30/18      PT LONG TERM GOAL #4   Title  Patient will increase lower extremity functional scale to >60/80 to demonstrate improved functional mobility and increased tolerance with ADLs.     Baseline  814: 29/80 8/28: 20/80 10/10: 20/80 11/5: 22/80; 12/11/17: 18/80; 01/22/18: 27/80; 06/07/18: 30/80    Time  12    Period  Weeks    Status  Partially Met    Target Date  08/30/18      PT LONG TERM GOAL #5   Title  Patient will increase BLE gross strength to 4+/5 as to improve functional strength for independent gait, increased standing tolerance and increased ADL ability.    Baseline  8/14: 4-/15 8/28: 4-/5  9/17: L 4-/5 R 4/5      Time  4    Period  Weeks    Status  Achieved      PT LONG TERM GOAL #6   Title  Patient will perform 10 MWT in  >.5 m/s for improved mobility and safety with negotiating natural environment    Baseline  8/28: .16 m/s with RW  9/17: .53 m/s;     Time  4    Period  Weeks    Status  Achieved      PT LONG TERM GOAL #7   Title  Patient will perform 10 MWT in >1.0 m/s for improved mobility and safety with negotiating natural environment    Baseline  9/17: .53 m/s 10/10: .63 m/s with RW 11/5: 0.53ms; 12/11/17: 15.33s = 0.65 m/s with spc; 01/22/18: self-selected: 14.4s = 0.69 m/s, fastest: 11.8s = 0.85 m/s; 02/26/18: self-selected: 13.3s = 0.75 m/s, fastest: 10.6s = 0.94 m/s; 06/07/18: self-selected: 14.0s = 0.71 m/s, fastest: 11.7s = 0.85 m/s;    Time  12    Period  Weeks    Status  Partially Met    Target Date  08/30/18      PT LONG TERM GOAL #8   Title  Patient will ambulate 300 ft with least assistive AD and prosthesis to improve mobility around home and increase independence.     Baseline  9/17: 74 ft CGA with RW 10/10 ambulated 280 ft with RW, [facility] ambulate 4680fon 10/31; 01/22/18:  400', 02/26/18: 500'    Time  12    Period  Weeks    Status  Deferred    Target Date  08/30/18      PT LONG TERM GOAL  #9   TITLE  Patient will ascend/descend 4 stairs with rail assist independently without loss of balance to improve ability to get in/out of home.     Baseline  9/17: unable to negotiate stairs 10/10: ascend with step to pattern, BUE support, and CGA 11/5: patient demonstrates ability to perform CGA and BUE support    Time  4    Period  Weeks    Status  Achieved      PT LONG TERM GOAL  #10   TITLE  Patient will ascend/descend 4 stairs with rail assist independently with reciprocating pattern without loss of balance to improve ability to get in/out of home.     Baseline  11/5: step to pattern BUE support CGA; 12/11/17: BUE heavy support and reciprocal pattern;    Time  4    Period  Weeks    Status  Achieved      PT LONG TERM GOAL  #11   TITLE  Patient will ambulate >1016f during 688m  with LRAD to demonstrate improved community ambulation and functional mobility     Baseline  11/5: will complete next session; 01/22/18: 400' in 3 minutes but had to stop secondary to pain in knee; 01/22/18: 400' in 3 minutes limited by R knee pain, 02/26/18: 500' in 3 minutes limited by low back pain; 06/07/18: Deferred due to new prosthetic    Time  12    Period  Weeks    Status  Deferred    Target Date  08/30/18            Plan - 07/03/18 1415    Clinical Impression Statement  Patient presents to physical therapy with good motivation and decreased pain/fatigue. Patient challenged with negotiation of obstacles when he is required to be more coordinated with sequencing/awareness of body in space. Patient demonstrates increasing ability to negotiate obstacles with decreasing episodes of LOB/knocking cones over. will continue to benefit from skilled physical therapy to improve functional mobility, improve prosthetic use and increase ambulation to increase LOF and improve overall QOL.    Examination-Participation Restrictions  Interpersonal Relationship    Rehab Potential  Fair    Clinical Impairments Affecting Rehab Potential  (+) previous independence, motivation to return to walking (-) lives alone, hx of diabetes, limited vision     PT Frequency  2x / week    PT Duration  8 weeks    PT Treatment/Interventions  ADLs/Self Care Home Management;Cryotherapy;Electrical Stimulation;Ultrasound;Traction;Moist Heat;DME Instruction;Gait training;Stair training;Functional mobility training;Therapeutic activities;Therapeutic exercise;Patient/family education;Neuromuscular re-education;Balance training;Prosthetic Training;Wheelchair mobility training;Manual techniques;Manual lymph drainage;Compression bandaging;Taping;Energy conservation;Passive range of motion    PT Next Visit Plan  6MWT once pt is comfortable with his new prosthetic, progress standing exercises, gait training, strengthening, balance, gait  training with new prosthetic    PT Home Exercise Plan  see [id number] on medbridge, spc ambulation    Consulted and Agree with Plan of Care  Patient       Patient will benefit from skilled therapeutic intervention in order to improve the following deficits and impairments:  Abnormal gait, Decreased activity tolerance, Decreased balance, Decreased knowledge of precautions, Decreased endurance, Decreased knowledge of use of DME, Decreased mobility, Decreased range of motion, Difficulty walking, Decreased strength, Increased edema, Impaired flexibility, Impaired perceived functional ability, Prosthetic Dependency, Postural  dysfunction, Improper body mechanics, Pain  Visit Diagnosis: 1. Muscle weakness (generalized)   2. Unsteadiness on feet   3. Other abnormalities of gait and mobility   4. Unilateral complete BKA, right, sequela [facility])        Problem List Patient Active Problem List   Diagnosis Date Noted  . Abnormality of gait 10/12/2017  . Poorly controlled type 2 diabetes mellitus with peripheral neuropathy ([place])   . Flatulence   . Hypomagnesemia   . Unilateral complete BKA, right, sequela ([place])   . Benign essential HTN   . Hypoalbuminemia due to protein-calorie malnutrition ([place])   . S/P below knee amputation, right ([place]) 04/12/2017  . Acute blood loss anemia   . Other encephalopathy 04/08/2017  . Encephalopathy 04/08/2017  . Altered mental status 04/08/2017  . Labile blood pressure   . Labile blood glucose   . Drug induced constipation   . Stage 3 chronic kidney disease ([place])   . Bacteremia   . S/P unilateral BKA (below knee amputation), right ([place]) 04/04/2017  . PAD (peripheral artery disease) ([place])   . Type 2 diabetes mellitus with right diabetic foot ulcer ([place])   . Post-operative pain   . Legally blind   . Upper GI bleed   . Streptococcal bacteremia 04/01/2017  . Hypokalemia 03/30/2017  . Uncontrolled type 2 diabetes mellitus with hyperglycemia, with long-term  current use of insulin ([place]) 03/30/2017  . Type 2 diabetes mellitus with peripheral neuropathy ([place]) 03/30/2017  . AKI (acute kidney injury) ([place]) 03/30/2017  . CKD stage 3 due to type 2 diabetes mellitus ([place]) 03/30/2017  . Sepsis ([place]) 03/30/2017  . Heart murmur 11/22/2016  . Hyperlipidemia associated with type 2 diabetes mellitus ([place]) 11/22/2016  . Obstructive sleep apnea syndrome 11/22/2016  . Essential hypertension 12/17/2012   Janna Arch, PT, DPT   07/03/2018, 2:16 PM  [place] MAIN [facility] SERVICES [address] [place], [place], [address] Phone: [phone]   Fax:  [phone]  Name: Trevor [name] MRN: [id number] Date of Birth: 04-21-1955

## 2018-07-05 ENCOUNTER — Other Ambulatory Visit: Payer: Self-pay

## 2018-07-05 ENCOUNTER — Ambulatory Visit (INDEPENDENT_AMBULATORY_CARE_PROVIDER_SITE_OTHER): Payer: Medicare Other | Admitting: Podiatry

## 2018-07-05 ENCOUNTER — Ambulatory Visit: Payer: Medicare Other

## 2018-07-05 ENCOUNTER — Encounter: Payer: Self-pay | Admitting: Podiatry

## 2018-07-05 DIAGNOSIS — B351 Tinea unguium: Secondary | ICD-10-CM

## 2018-07-05 DIAGNOSIS — R2689 Other abnormalities of gait and mobility: Secondary | ICD-10-CM

## 2018-07-05 DIAGNOSIS — S88111S Complete traumatic amputation at level between knee and ankle, right lower leg, sequela: Secondary | ICD-10-CM

## 2018-07-05 DIAGNOSIS — Z794 Long term (current) use of insulin: Secondary | ICD-10-CM

## 2018-07-05 DIAGNOSIS — R2681 Unsteadiness on feet: Secondary | ICD-10-CM

## 2018-07-05 DIAGNOSIS — E1142 Type 2 diabetes mellitus with diabetic polyneuropathy: Secondary | ICD-10-CM | POA: Diagnosis not present

## 2018-07-05 DIAGNOSIS — E1165 Type 2 diabetes mellitus with hyperglycemia: Secondary | ICD-10-CM | POA: Diagnosis not present

## 2018-07-05 DIAGNOSIS — M6281 Muscle weakness (generalized): Secondary | ICD-10-CM

## 2018-07-05 DIAGNOSIS — M79674 Pain in right toe(s): Secondary | ICD-10-CM

## 2018-07-05 NOTE — Therapy (Signed)
[place] [facility]IN [facility] [address] [place], [place], [address] Phone: [phone]   Fax:  [phone]  Physical Therapy Treatment  Patient Details  Name: Trevor [name] MRN: [id number] Date of Birth: 21-Aug-1955 Referring Provider (PT): Jamse Arn, DR   Encounter Date: 07/05/2018  PT End of Session - 07/05/18 1240    Visit Number  [id number]    Number of Visits  73    Date for PT Re-Evaluation  08/30/18    Authorization Type  Eval 08/23/17; Last goals 06/07/18    PT Start Time  1015    PT Stop Time  1101    PT Time Calculation (min)  46 min    Equipment Utilized During Treatment  Gait belt   Rt BKA prosthesis   Activity Tolerance  Patient tolerated treatment well;Patient limited by fatigue    Behavior During Therapy  WFL for tasks assessed/performed       Past Medical History:  Diagnosis Date  . Diabetes mellitus without complication ([place])   . Heart murmur   . Hyperlipidemia   . Hypertension   . Sleep apnea     Past Surgical History:  Procedure Laterality Date  . AMPUTATION Right 03/31/2017   Procedure: RIGHT FOOT 1ST AND 2ND RAY AMPUTATION;  Surgeon: Newt Minion, MD;  Location: [facility];  Service: Orthopedics;  Laterality: Right;  . AMPUTATION Right 04/01/2017   Procedure: AMPUTATION BELOW KNEE;  Surgeon: Newt Minion, MD;  Location: [facility];  Service: Orthopedics;  Laterality: Right;  . COLONOSCOPY WITH PROPOFOL N/A 10/02/2017   Procedure: COLONOSCOPY WITH PROPOFOL;  Surgeon: [name], MD;  Location: [facility];  Service: Gastroenterology;  Laterality: N/A;  . CORNEAL TRANSPLANT      There were no vitals filed for this visit.  Subjective Assessment - 07/05/18 1239    Subjective  Patient presents to physical therapy with back pain, reports it has been hurting all morning.    Pertinent History  63 year old right-handed male with history of diabetes mellitus, hypertension, legally blind, and CKD presenting for BKA  04/01/17. Patient wears contacts and can see better now.  Patient had two surgeries, one was a couple toes then they decided to go below the knee. 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 Has ambulated with RW in hospital with PT. Patient has had home health therapy and was in inpatient rehab about a month. Patient reports he was mainly sitting down, with occasional walking, last home health therapy in May. Patient reports some standing up but no exercises. Patient lives alone in the house and is scared they might fall. Patient received prosthetic leg Tuesday of last week.    Limitations  Standing;Walking;House hold activities;Other (comment)    How long can you stand comfortably?  10-15 minutes    How long can you walk comfortably?  10-15 minutes    Patient Stated Goals  going up steps and driving car.     Currently in Pain?  Yes    Pain Score  6     Pain Location  Back    Pain Orientation  Lower    Pain Descriptors / Indicators  Aching    Pain Type  Acute pain    Pain Onset  Today    Pain Frequency  Intermittent    Aggravating Factors   bending over    Pain Relieving Factors  extending trunk          Therapeutic Exercise:   airex pad: passing basketball inside and outside BOS x2 minutes Side stepping while passing basketball to PT without LOB inside // bars x 4 lengths of bars Ambulate across unstable surface in // bars (red mat) cueing for placement of LE's, hip/knee flexion for optimal foot clearance x 6 trials, progressed to putting hedgehogs under mat to increase unstable/unlevel surface, cueing for ensuring flat surface for foot prior to shifting full weight to avoid LOB x 8 lengths of // bars    Prone: modified prone press ups 15x 2 sets for pain relief Prone: bent knee hip extension for gluteal activation 10x each LE Supine: hamstring stretch BLE 60 seconds each LE Supine: single knee to chest 60 second holds  BLE Supine: hooklying with bolster under knees: posterior pelvic tilts 10x 3 second holds  Seated: trunk extension with rolled towel at lumbar region 10x , 2 sets    Patient requires min/mod cueing for sequencing and muscle activation for technique and intervention performance.                   PT Education - 07/05/18 1240    Education Details  exercise technique, back pain relief    Person(s) Educated  Patient    Methods  Explanation;Demonstration;Tactile cues;Verbal cues    Comprehension  Verbalized understanding;Returned demonstration;Verbal cues required;Tactile cues required       PT Short Term Goals - 06/07/18 1617      PT SHORT TERM GOAL #1   Title  Patient will be independent in home exercise program to improve strength/mobility for better functional independence with ADLs.    Baseline  HEP given, 12/11/17: Performing daily:     Time  2    Period  Weeks    Status  Achieved      PT SHORT TERM GOAL #2   Title  Patient will don/doff prosthesis independently to allow for increased mobility in home.     Baseline  8/14: requires PT to guide/direct through process 8/28: independent    Time  2    Period  Weeks    Status  Achieved      PT SHORT TERM GOAL #3   Title  Patient will ambulate 10 meters with least assistive device and prosthesis to improve mobility in home.     Baseline  8/14: not walking outside of // bars yet  8/28: 63 seconds with RW and CGA     Time  2    Period  Weeks    Status  Achieved      PT SHORT TERM GOAL #4   Title  Patient will perform STS with single UE support to decrease reliance upon UE's for stability     Baseline  patient requires BUE support 11/5: 1 UR support    Time  2    Period  Weeks    Status  Achieved        PT Long Term Goals - 06/07/18 1618      PT LONG TERM GOAL #1   Title  Patient will ambulate 60 ft with least assistive AD and prosthesis to improve mobility around home and increase independence.     Baseline   8/14: requires use of // bars 8/28: 30 ft with RW and CGA 9/17: 74 ft x2 with CGA and RW     Time  4    Period  Weeks    Status  Achieved      PT LONG TERM GOAL #2   Title  Patient (> 40 years old) will complete five times sit to stand test in < 15 seconds indicating an increased LE strength and improved balance.    Baseline  8/14: 61 seconds 8/28; 42 seconds 9/17: 24 seconds BUE support with LLE 10/10: 17 seconds with excessive BUE support  11/5: 18seconds BUE support; 12/11/17: 12/11/17: 18.5s with BUE support but no AD, 01/22/18: 17.1s BUE support but no AD; 02/26/18: 13.0s with BUE, still unable to perform without UE assistance; 06/07/18: 25.4s with single UE support during first rep only;    Time  12    Period  Weeks    Status  Partially Met    Target Date  08/30/18      PT LONG TERM GOAL #3   Title  Patient will reduce timed up and go to <11 seconds to reduce fall risk and demonstrate improved transfer/gait ability.    Baseline  8/14: unable to perform 8/28: 1 min 30 seconds 9/17: 38 seconds RW 10/10: 25 seconds with RW 11/5: 20 seconds with cane;, 12/11/17: 17.4s with spc; 01/22/18: 14.8s with spc; 02/26/18: 13.0s with spc; 06/07/18: 16.1s with RW;    Time  12    Period  Weeks    Status  Partially Met    Target Date  08/30/18      PT LONG TERM GOAL #4   Title  Patient will increase lower extremity functional scale to >60/80 to demonstrate improved functional mobility and increased tolerance with ADLs.     Baseline  814: 29/80 8/28: 20/80 10/10: 20/80 11/5: 22/80; 12/11/17: 18/80; 01/22/18: 27/80; 06/07/18: 30/80    Time  12    Period  Weeks    Status  Partially Met    Target Date  08/30/18      PT LONG TERM GOAL #5   Title  Patient will increase BLE gross strength to 4+/5 as to improve functional strength for independent gait, increased standing tolerance and increased ADL ability.    Baseline  8/14: 4-/15 8/28: 4-/5  9/17: L 4-/5 R 4/5      Time  4    Period  Weeks    Status  Achieved       PT LONG TERM GOAL #6   Title  Patient will perform 10 MWT in >.5 m/s for improved mobility and safety with negotiating natural environment    Baseline  8/28: .16 m/s with RW  9/17: .53 m/s;     Time  4    Period  Weeks    Status  Achieved      PT LONG TERM GOAL #7   Title  Patient will perform 10 MWT in >1.0 m/s for improved mobility and safety with negotiating natural environment    Baseline  9/17: .53 m/s 10/10: .63 m/s with RW 11/5: 0.17ms; 12/11/17: 15.33s = 0.65 m/s with spc; 01/22/18: self-selected: 14.4s = 0.69 m/s, fastest: 11.8s = 0.85 m/s; 02/26/18: self-selected: 13.3s = 0.75 m/s, fastest: 10.6s = 0.94 m/s; 06/07/18: self-selected: 14.0s = 0.71 m/s, fastest: 11.7s = 0.85 m/s;    Time  12    Period  Weeks    Status  Partially Met    Target Date  08/30/18      PT LONG TERM GOAL #8   Title  Patient will ambulate 300 ft with least assistive AD and prosthesis to improve mobility around home and increase independence.  Baseline  9/17: 74 ft CGA with RW 10/10 ambulated 280 ft with RW, 70 ft with [facility] 11/5: ambulate 420f on 10/31; 01/22/18: 400', 02/26/18: 500'    Time  12    Period  Weeks    Status  Deferred    Target Date  08/30/18      PT LONG TERM GOAL  #9   TITLE  Patient will ascend/descend 4 stairs with rail assist independently without loss of balance to improve ability to get in/out of home.     Baseline  9/17: unable to negotiate stairs 10/10: ascend with step to pattern, BUE support, and CGA 11/5: patient demonstrates ability to perform CGA and BUE support    Time  4    Period  Weeks    Status  Achieved      PT LONG TERM GOAL  #10   TITLE  Patient will ascend/descend 4 stairs with rail assist independently with reciprocating pattern without loss of balance to improve ability to get in/out of home.     Baseline  11/5: step to pattern BUE support CGA; 12/11/17: BUE heavy support and reciprocal pattern;    Time  4    Period  Weeks    Status  Achieved      PT LONG  TERM GOAL  #11   TITLE  Patient will ambulate >10013fduring 16m49mwith LRAD to demonstrate improved community ambulation and functional mobility     Baseline  11/5: will complete next session; 01/22/18: 400' in 3 minutes but had to stop secondary to pain in knee; 01/22/18: 400' in 3 minutes limited by R knee pain, 02/26/18: 500' in 3 minutes limited by low back pain; 06/07/18: Deferred due to new prosthetic    Time  12    Period  Weeks    Status  Deferred    Target Date  08/30/18            Plan - 07/05/18 1242    Clinical Impression Statement  Patients session was limited by back pain requiring interventions focusing on reduction of symptoms prior to progression of dynamic mobility and strength. Patient's pain relieved with trunk extension and patient educated on posture, spinal alignment, and performing interventions at home for reduction of symptoms. He will continue to benefit from skilled physical therapy to improve functional mobility, improve prosthetic use and increase ambulation to increase LOF and improve overall QOL    Examination-Participation Restrictions  Interpersonal Relationship    Rehab Potential  Fair    Clinical Impairments Affecting Rehab Potential  (+) previous independence, motivation to return to walking (-) lives alone, hx of diabetes, limited vision     PT Frequency  2x / week    PT Duration  8 weeks    PT Treatment/Interventions  ADLs/Self Care Home Management;Cryotherapy;Electrical Stimulation;Ultrasound;Traction;Moist Heat;DME Instruction;Gait training;Stair training;Functional mobility training;Therapeutic activities;Therapeutic exercise;Patient/family education;Neuromuscular re-education;Balance training;Prosthetic Training;Wheelchair mobility training;Manual techniques;Manual lymph drainage;Compression bandaging;Taping;Energy conservation;Passive range of motion    PT Next Visit Plan  6MWT once pt is comfortable with his new prosthetic, progress standing exercises,  gait training, strengthening, balance, gait training with new prosthetic    PT Home Exercise Plan  see [id number] on medbridge, spc ambulation    Consulted and Agree with Plan of Care  Patient       Patient will benefit from skilled therapeutic intervention in order to improve the following deficits and impairments:  Abnormal gait, Decreased activity tolerance, Decreased balance, Decreased knowledge of precautions, Decreased endurance, Decreased knowledge of  use of DME, Decreased mobility, Decreased range of motion, Difficulty walking, Decreased strength, Increased edema, Impaired flexibility, Impaired perceived functional ability, Prosthetic Dependency, Postural dysfunction, Improper body mechanics, Pain  Visit Diagnosis: 1. Muscle weakness (generalized)   2. Unsteadiness on feet   3. Other abnormalities of gait and mobility   4. Unilateral complete BKA, right, sequela [facility])        Problem List Patient Active Problem List   Diagnosis Date Noted  . Abnormality of gait 10/12/2017  . Poorly controlled type 2 diabetes mellitus with peripheral neuropathy ([place])   . Flatulence   . Hypomagnesemia   . Unilateral complete BKA, right, sequela ([place])   . Benign essential HTN   . Hypoalbuminemia due to protein-calorie malnutrition ([place])   . S/P below knee amputation, right ([place]) 04/12/2017  . Acute blood loss anemia   . Other encephalopathy 04/08/2017  . Encephalopathy 04/08/2017  . Altered mental status 04/08/2017  . Labile blood pressure   . Labile blood glucose   . Drug induced constipation   . Stage 3 chronic kidney disease ([place])   . Bacteremia   . S/P unilateral BKA (below knee amputation), right ([place]) 04/04/2017  . PAD (peripheral artery disease) ([place])   . Type 2 diabetes mellitus with right diabetic foot ulcer ([place])   . Post-operative pain   . Legally blind   . Upper GI bleed   . Streptococcal bacteremia 04/01/2017  . Hypokalemia 03/30/2017  . Uncontrolled type 2 diabetes  mellitus with hyperglycemia, with long-term current use of insulin ([place]) 03/30/2017  . Type 2 diabetes mellitus with peripheral neuropathy ([place]) 03/30/2017  . AKI (acute kidney injury) ([place]) 03/30/2017  . CKD stage 3 due to type 2 diabetes mellitus ([place]) 03/30/2017  . Sepsis ([place]) 03/30/2017  . Heart murmur 11/22/2016  . Hyperlipidemia associated with type 2 diabetes mellitus ([place]) 11/22/2016  . Obstructive sleep apnea syndrome 11/22/2016  . Essential hypertension 12/17/2012   Janna Arch, PT, DPT   07/05/2018, 12:43 PM  [place] [facility]IN [facility] SERVICES [address] [place], [place], [address] Phone: [phone]   Fax:  [phone]  Name: Trevor [name] MRN: [id number] Date of Birth: 12-Jun-1955

## 2018-07-05 NOTE — Progress Notes (Signed)
Complaint:  Visit Type: Patient returns to my office for continued preventative foot care services. Complaint: Patient states" my nails have grown long and thick and become painful to walk and wear shoes" Patient has been diagnosed with DM with neuropathy left foot.  Patient has amputation right leg.. The patient presents for preventative foot care services. No changes to ROS.  Patient has diabetic neuropathy and angiopathy.  Podiatric Exam: Vascular: dorsalis pedis and posterior tibial pulses are not  palpable  left foot. Capillary return is immediate. Temperature gradient is WNL. Skin turgor WNL  Sensorium: Diminished  Semmes Weinstein monofilament test. Diminished  tactile sensation bilaterally. Nail Exam: Pt has thick disfigured discolored nails with subungual debris noted left  entire nail hallux through fifth toenails Ulcer Exam: There is no evidence of ulcer or pre-ulcerative changes or infection. Orthopedic Exam: Muscle tone and strength are WNL. No limitations in general ROM. No crepitus or effusions noted. Foot type and digits show no abnormalities. Bony prominences are unremarkable. Skin: No Porokeratosis. No infection or ulcers.  Crust with fizzures medial plantar aspect left foot.  Diagnosis:  Onychomycosis, , Pain in right toe, pain in left toes  Treatment & Plan Procedures and Treatment: Consent by patient was obtained for treatment procedures.   Debridement of mycotic and hypertrophic toenails, 1 through 5 bilateral and clearing of subungual debris. No ulceration, no infection noted. Patient says his crusts on left foot have improved since his last visit.  These crusty lesions are not improving.  Therefore I suggested he have the crust evaluated by a dermatologist. Return Visit-Office Procedure: Patient instructed to return to the office for a follow up visit 10 weeks for continued evaluation and treatment.    Gardiner Barefoot DPM

## 2018-07-10 ENCOUNTER — Other Ambulatory Visit: Payer: Self-pay

## 2018-07-10 ENCOUNTER — Ambulatory Visit: Payer: Medicare Other

## 2018-07-10 DIAGNOSIS — M6281 Muscle weakness (generalized): Secondary | ICD-10-CM

## 2018-07-10 DIAGNOSIS — R2681 Unsteadiness on feet: Secondary | ICD-10-CM

## 2018-07-10 DIAGNOSIS — S88111S Complete traumatic amputation at level between knee and ankle, right lower leg, sequela: Secondary | ICD-10-CM

## 2018-07-10 DIAGNOSIS — R2689 Other abnormalities of gait and mobility: Secondary | ICD-10-CM

## 2018-07-10 NOTE — Therapy (Signed)
[place] MAIN [facility] SERVICES [address] [place], [place], [address] Phone: [phone]   Fax:  [phone]  Physical Therapy Treatment  Patient Details  Name: Trevor [name] MRN: [id number] Date of Birth: 1955/10/13 Referring Provider (PT): Jamse Arn, DR   Encounter Date: 07/10/2018  PT End of Session - 07/10/18 1454    Visit Number  55    Number of Visits  46    Date for PT Re-Evaluation  08/30/18    Authorization Type  Eval 08/23/17; Last goals 06/07/18    PT Start Time  1403    PT Stop Time  1447    PT Time Calculation (min)  44 min    Equipment Utilized During Treatment  Gait belt   Rt BKA prosthesis   Activity Tolerance  Patient tolerated treatment well;Patient limited by fatigue    Behavior During Therapy  WFL for tasks assessed/performed       Past Medical History:  Diagnosis Date  . Diabetes mellitus without complication ([place])   . Heart murmur   . Hyperlipidemia   . Hypertension   . Sleep apnea     Past Surgical History:  Procedure Laterality Date  . AMPUTATION Right 03/31/2017   Procedure: RIGHT FOOT 1ST AND 2ND RAY AMPUTATION;  Surgeon: Newt Minion, MD;  Location: [place];  Service: Orthopedics;  Laterality: Right;  . AMPUTATION Right 04/01/2017   Procedure: AMPUTATION BELOW KNEE;  Surgeon: Newt Minion, MD;  Location: [place];  Service: Orthopedics;  Laterality: Right;  . COLONOSCOPY WITH PROPOFOL N/A 10/02/2017   Procedure: COLONOSCOPY WITH PROPOFOL;  Surgeon: [name], MD;  Location: [facility] ENDOSCOPY;  Service: Gastroenterology;  Laterality: N/A;  . CORNEAL TRANSPLANT      There were no vitals filed for this visit.  Subjective Assessment - 07/10/18 1407    Subjective  Patient went to foot doctor since last session. Reports no falls or LOB since last session. Was mistaken about appointment times and arrived early    Pertinent History  63 year old right-handed male with history of diabetes mellitus,  hypertension, legally blind, and CKD presenting for BKA 04/01/17. Patient wears contacts and can see better now.  Patient had two surgeries, one was a couple toes then they decided to go below the knee. 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 Has ambulated with RW in hospital with PT. Patient has had home health therapy and was in inpatient rehab about a month. Patient reports he was mainly sitting down, with occasional walking, last home health therapy in May. Patient reports some standing up but no exercises. Patient lives alone in the house and is scared they might fall. Patient received prosthetic leg Tuesday of last week.    Limitations  Standing;Walking;House hold activities;Other (comment)    How long can you stand comfortably?  10-15 minutes    How long can you walk comfortably?  10-15 minutes    Patient Stated Goals  going up steps and driving car.     Currently in Pain?  No/denies        129/80 pulse 60   Gait Training  Gait trainingin hallx1200' with single point cane in LUE with  R prosthetic legprogressing to no AD intermittently.Pt was able to progress to no cane for approximately 150' but had to stop due to mild increase in R knee pain. Oneseatedrest break required.No R knee  or residual limb pain during ambulationexcept when attempting to holdsingle point caneoff the ground  TherEx:  Seated hamstring stretch 6" step 60 seconds each LE   6" step lateral toe taps no UE support. Cueing for upright posture in // bars for optimal muscle recruitment and stability   Bosu: blue side up: modified lunge with BUE support 10x each LE; tactile/verbal cueing for trunk position and stability   Bosu: blue side up: modified lateral lunge with BUE support decreasing to SUE support  Hip extension 12x each LE in // bars with BUE finger tip support.     Pt educated throughout session about proper posture and  technique with exercises. Improved exercise technique, movement at target joints, use of target muscles after min to mod verbal, visual, tactile cues.                    PT Education - 07/10/18 1453    Education Details  exercise technique, phantom limb pain    Person(s) Educated  Patient    Methods  Explanation;Demonstration;Tactile cues;Verbal cues    Comprehension  Verbalized understanding;Returned demonstration;Verbal cues required;Tactile cues required       PT Short Term Goals - 06/07/18 1617      PT SHORT TERM GOAL #1   Title  Patient will be independent in home exercise program to improve strength/mobility for better functional independence with ADLs.    Baseline  HEP given, 12/11/17: Performing daily:     Time  2    Period  Weeks    Status  Achieved      PT SHORT TERM GOAL #2   Title  Patient will don/doff prosthesis independently to allow for increased mobility in home.     Baseline  8/14: requires PT to guide/direct through process 8/28: independent    Time  2    Period  Weeks    Status  Achieved      PT SHORT TERM GOAL #3   Title  Patient will ambulate 10 meters with least assistive device and prosthesis to improve mobility in home.     Baseline  8/14: not walking outside of // bars yet  8/28: 63 seconds with RW and CGA     Time  2    Period  Weeks    Status  Achieved      PT SHORT TERM GOAL #4   Title  Patient will perform STS with single UE support to decrease reliance upon UE's for stability     Baseline  patient requires BUE support 11/5: 1 UR support    Time  2    Period  Weeks    Status  Achieved        PT Long Term Goals - 06/07/18 1618      PT LONG TERM GOAL #1   Title  Patient will ambulate 60 ft with least assistive AD and prosthesis to improve mobility around home and increase independence.     Baseline  8/14: requires use of // bars 8/28: 30 ft with RW and CGA 9/17: 74 ft x2 with CGA and RW     Time  4    Period  Weeks     Status  Achieved      PT LONG TERM GOAL #2   Title  Patient (> 44 years old) will complete five times sit to stand test in < 15 seconds indicating an increased LE strength and improved balance.    Baseline  8/14:  61 seconds 8/28; 42 seconds 9/17: 24 seconds BUE support with LLE 10/10: 17 seconds with excessive BUE support  11/5: 18seconds BUE support; 12/11/17: 12/11/17: 18.5s with BUE support but no AD, 01/22/18: 17.1s BUE support but no AD; 02/26/18: 13.0s with BUE, still unable to perform without UE assistance; 06/07/18: 25.4s with single UE support during first rep only;    Time  12    Period  Weeks    Status  Partially Met    Target Date  08/30/18      PT LONG TERM GOAL #3   Title  Patient will reduce timed up and go to <11 seconds to reduce fall risk and demonstrate improved transfer/gait ability.    Baseline  8/14: unable to perform 8/28: 1 min 30 seconds 9/17: 38 seconds RW 10/10: 25 seconds with RW 11/5: 20 seconds with cane;, 12/11/17: 17.4s with spc; 01/22/18: 14.8s with spc; 02/26/18: 13.0s with spc; 06/07/18: 16.1s with RW;    Time  12    Period  Weeks    Status  Partially Met    Target Date  08/30/18      PT LONG TERM GOAL #4   Title  Patient will increase lower extremity functional scale to >60/80 to demonstrate improved functional mobility and increased tolerance with ADLs.     Baseline  814: 29/80 8/28: 20/80 10/10: 20/80 11/5: 22/80; 12/11/17: 18/80; 01/22/18: 27/80; 06/07/18: 30/80    Time  12    Period  Weeks    Status  Partially Met    Target Date  08/30/18      PT LONG TERM GOAL #5   Title  Patient will increase BLE gross strength to 4+/5 as to improve functional strength for independent gait, increased standing tolerance and increased ADL ability.    Baseline  8/14: 4-/15 8/28: 4-/5  9/17: L 4-/5 R 4/5      Time  4    Period  Weeks    Status  Achieved      PT LONG TERM GOAL #6   Title  Patient will perform 10 MWT in >.5 m/s for improved mobility and safety with  negotiating natural environment    Baseline  8/28: .16 m/s with RW  9/17: .53 m/s;     Time  4    Period  Weeks    Status  Achieved      PT LONG TERM GOAL #7   Title  Patient will perform 10 MWT in >1.0 m/s for improved mobility and safety with negotiating natural environment    Baseline  9/17: .53 m/s 10/10: .63 m/s with RW 11/5: 0.64ms; 12/11/17: 15.33s = 0.65 m/s with spc; 01/22/18: self-selected: 14.4s = 0.69 m/s, fastest: 11.8s = 0.85 m/s; 02/26/18: self-selected: 13.3s = 0.75 m/s, fastest: 10.6s = 0.94 m/s; 06/07/18: self-selected: 14.0s = 0.71 m/s, fastest: 11.7s = 0.85 m/s;    Time  12    Period  Weeks    Status  Partially Met    Target Date  08/30/18      PT LONG TERM GOAL #8   Title  Patient will ambulate 300 ft with least assistive AD and prosthesis to improve mobility around home and increase independence.     Baseline  9/17: 74 ft CGA with RW 10/10 ambulated 280 ft with RW, [facility]: ambulate [id number] 10/31; 01/22/18: 400', 02/26/18: 500'    Time  12    Period  Weeks    Status  Deferred  Target Date  08/30/18      PT LONG TERM GOAL  #9   TITLE  Patient will ascend/descend 4 stairs with rail assist independently without loss of balance to improve ability to get in/out of home.     Baseline  9/17: unable to negotiate stairs 10/10: ascend with step to pattern, BUE support, and CGA 11/5: patient demonstrates ability to perform CGA and BUE support    Time  4    Period  Weeks    Status  Achieved      PT LONG TERM GOAL  #10   TITLE  Patient will ascend/descend 4 stairs with rail assist independently with reciprocating pattern without loss of balance to improve ability to get in/out of home.     Baseline  11/5: step to pattern BUE support CGA; 12/11/17: BUE heavy support and reciprocal pattern;    Time  4    Period  Weeks    Status  Achieved      PT LONG TERM GOAL  #11   TITLE  Patient will ambulate >1078f during 655m with LRAD to demonstrate improved community  ambulation and functional mobility     Baseline  11/5: will complete next session; 01/22/18: 400' in 3 minutes but had to stop secondary to pain in knee; 01/22/18: 400' in 3 minutes limited by R knee pain, 02/26/18: 500' in 3 minutes limited by low back pain; 06/07/18: Deferred due to new prosthetic    Time  12    Period  Weeks    Status  Deferred    Target Date  08/30/18            Plan - 07/10/18 1455    Clinical Impression Statement  Patient arrived early to session due to mixing up times. Patient continues to progress with decreasing reliance upon AD's for ambulation however had noted phantom limb pain that was reduced with stretching and task challenges.  Patient requires intermittent seated rest breaks due to fatigue. He will continue to benefit from skilled physical therapy to improve functional mobility, improve prosthetic use and increase ambulation to increase LOF and improve overall QOL.    Examination-Participation Restrictions  Interpersonal Relationship    Rehab Potential  Fair    Clinical Impairments Affecting Rehab Potential  (+) previous independence, motivation to return to walking (-) lives alone, hx of diabetes, limited vision     PT Frequency  2x / week    PT Duration  8 weeks    PT Treatment/Interventions  ADLs/Self Care Home Management;Cryotherapy;Electrical Stimulation;Ultrasound;Traction;Moist Heat;DME Instruction;Gait training;Stair training;Functional mobility training;Therapeutic activities;Therapeutic exercise;Patient/family education;Neuromuscular re-education;Balance training;Prosthetic Training;Wheelchair mobility training;Manual techniques;Manual lymph drainage;Compression bandaging;Taping;Energy conservation;Passive range of motion    PT Next Visit Plan  6MWT once pt is comfortable with his new prosthetic, progress standing exercises, gait training, strengthening, balance, gait training with new prosthetic    PT Home Exercise Plan  see [id number] on medbridge, spc  ambulation    Consulted and Agree with Plan of Care  Patient       Patient will benefit from skilled therapeutic intervention in order to improve the following deficits and impairments:  Abnormal gait, Decreased activity tolerance, Decreased balance, Decreased knowledge of precautions, Decreased endurance, Decreased knowledge of use of DME, Decreased mobility, Decreased range of motion, Difficulty walking, Decreased strength, Increased edema, Impaired flexibility, Impaired perceived functional ability, Prosthetic Dependency, Postural dysfunction, Improper body mechanics, Pain  Visit Diagnosis: 1. Muscle weakness (generalized)   2. Unsteadiness on feet   3. Other abnormalities  of gait and mobility   4. Unilateral complete BKA, right, sequela [facility])        Problem List Patient Active Problem List   Diagnosis Date Noted  . Pain due to onychomycosis of toenail of right foot 07/05/2018  . Abnormality of gait 10/12/2017  . Poorly controlled type 2 diabetes mellitus with peripheral neuropathy ([place])   . Flatulence   . Hypomagnesemia   . Unilateral complete BKA, right, sequela ([place])   . Benign essential HTN   . Hypoalbuminemia due to protein-calorie malnutrition ([place])   . S/P below knee amputation, right ([place]) 04/12/2017  . Acute blood loss anemia   . Other encephalopathy 04/08/2017  . Encephalopathy 04/08/2017  . Altered mental status 04/08/2017  . Labile blood pressure   . Labile blood glucose   . Drug induced constipation   . Stage 3 chronic kidney disease ([place])   . Bacteremia   . S/P unilateral BKA (below knee amputation), right ([place]) 04/04/2017  . PAD (peripheral artery disease) ([place])   . Type 2 diabetes mellitus with right diabetic foot ulcer ([place])   . Post-operative pain   . Legally blind   . Upper GI bleed   . Streptococcal bacteremia 04/01/2017  . Hypokalemia 03/30/2017  . Uncontrolled type 2 diabetes mellitus with hyperglycemia, with long-term current use of insulin  ([place]) 03/30/2017  . Type 2 diabetes mellitus with peripheral neuropathy ([place]) 03/30/2017  . AKI (acute kidney injury) ([place]) 03/30/2017  . CKD stage 3 due to type 2 diabetes mellitus ([place]) 03/30/2017  . Sepsis ([place]) 03/30/2017  . Heart murmur 11/22/2016  . Hyperlipidemia associated with type 2 diabetes mellitus ([place]) 11/22/2016  . Obstructive sleep apnea syndrome 11/22/2016  . Essential hypertension 12/17/2012   Janna Arch, PT, DPT   07/10/2018, 2:56 PM  [place] MAIN [facility] SERVICES [address] [place], [place], [address] Phone: [phone]   Fax:  [phone]  Name: Trevor [name] MRN: [id number] Date of Birth: 1955/03/15

## 2018-07-12 ENCOUNTER — Other Ambulatory Visit: Payer: Self-pay

## 2018-07-12 ENCOUNTER — Ambulatory Visit: Payer: Medicare Other | Attending: Physical Medicine & Rehabilitation

## 2018-07-12 DIAGNOSIS — R2681 Unsteadiness on feet: Secondary | ICD-10-CM | POA: Diagnosis present

## 2018-07-12 DIAGNOSIS — S88111S Complete traumatic amputation at level between knee and ankle, right lower leg, sequela: Secondary | ICD-10-CM | POA: Diagnosis present

## 2018-07-12 DIAGNOSIS — R2689 Other abnormalities of gait and mobility: Secondary | ICD-10-CM | POA: Diagnosis present

## 2018-07-12 DIAGNOSIS — M6281 Muscle weakness (generalized): Secondary | ICD-10-CM

## 2018-07-12 NOTE — Therapy (Signed)
[place] MAIN [facility] SERVICES [address] [place], [place], [address] Phone: [phone]   Fax:  [phone]  Physical Therapy Treatment  Patient Details  Name: Trevor [name] MRN: [id number] Date of Birth: August 06, 1955 Referring Provider (PT): Jamse Arn, DR   Encounter Date: 07/12/2018  PT End of Session - 07/12/18 1413    Visit Number  [id number]    Number of Visits  34    Date for PT Re-Evaluation  08/30/18    Authorization Type  Eval 08/23/17; Last goals 06/07/18    PT Start Time  1325    PT Stop Time  1401    PT Time Calculation (min)  36 min    Equipment Utilized During Treatment  Gait belt   Rt BKA prosthesis   Activity Tolerance  Patient tolerated treatment well;Patient limited by fatigue    Behavior During Therapy  WFL for tasks assessed/performed       Past Medical History:  Diagnosis Date  . Diabetes mellitus without complication ([place])   . Heart murmur   . Hyperlipidemia   . Hypertension   . Sleep apnea     Past Surgical History:  Procedure Laterality Date  . AMPUTATION Right 03/31/2017   Procedure: RIGHT FOOT 1ST AND 2ND RAY AMPUTATION;  Surgeon: Newt Minion, MD;  Location: [place];  Service: Orthopedics;  Laterality: Right;  . AMPUTATION Right 04/01/2017   Procedure: AMPUTATION BELOW KNEE;  Surgeon: Newt Minion, MD;  Location: [place];  Service: Orthopedics;  Laterality: Right;  . COLONOSCOPY WITH PROPOFOL N/A 10/02/2017   Procedure: COLONOSCOPY WITH PROPOFOL;  Surgeon: [name], MD;  Location: [facility] ENDOSCOPY;  Service: Gastroenterology;  Laterality: N/A;  . CORNEAL TRANSPLANT      There were no vitals filed for this visit.  Subjective Assessment - 07/12/18 1404    Subjective  Patient reports he went to the prosthetist yesterday where he lengthened the pin and shortened the shrinker. No falls or LOB since last session.    Pertinent History  63 year old right-handed male with history of diabetes mellitus,  hypertension, legally blind, and CKD presenting for BKA 04/01/17. Patient wears contacts and can see better now.  Patient had two surgeries, one was a couple toes then they decided to go below the knee. 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 Has ambulated with RW in hospital with PT. Patient has had home health therapy and was in inpatient rehab about a month. Patient reports he was mainly sitting down, with occasional walking, last home health therapy in May. Patient reports some standing up but no exercises. Patient lives alone in the house and is scared they might fall. Patient received prosthetic leg Tuesday of last week.    Limitations  Standing;Walking;House hold activities;Other (comment)    How long can you stand comfortably?  10-15 minutes    How long can you walk comfortably?  10-15 minutes    Patient Stated Goals  going up steps and driving car.     Currently in Pain?  No/denies          Gait Training  Gait training in hall x 1100' with single point cane in LUE with  R prosthetic leg progressing to no AD intermittently.  One seated rest break required.  No R knee or residual limb pain during ambulation except when attempting to hold single point cane off the  ground   TherEx:  Seated hamstring stretch 6" step 60 seconds each LE    airex pad: one foot on each color for modified tandem stance 2x30 seconds. No UE support, more challenging with prosthetic limb back.    Bosu: blue side up: modified lunge with BUE support 10x each LE; tactile/verbal cueing for trunk position and stability    Bosu: blue side up: modified lateral lunge with BUE support decreasing to SUE support   Seated trunk extension for low back pain relief x 30 seconds   Hip adduction squeeze 10x 3 second holds seated  Airex pad: static stand throwing football inside and outside BOS.    Pt educated throughout session about proper posture and  technique with exercises. Improved exercise technique, movement at target joints, use of target muscles after min to mod verbal, visual, tactile cues.                         PT Education - 07/12/18 1412    Education Details  exercise technique, stability, body mechanics    Person(s) Educated  Patient    Methods  Explanation;Demonstration;Tactile cues;Verbal cues    Comprehension  Verbalized understanding;Returned demonstration;Verbal cues required;Tactile cues required       PT Short Term Goals - 06/07/18 1617      PT SHORT TERM GOAL #1   Title  Patient will be independent in home exercise program to improve strength/mobility for better functional independence with ADLs.    Baseline  HEP given, 12/11/17: Performing daily:     Time  2    Period  Weeks    Status  Achieved      PT SHORT TERM GOAL #2   Title  Patient will don/doff prosthesis independently to allow for increased mobility in home.     Baseline  8/14: requires PT to guide/direct through process 8/28: independent    Time  2    Period  Weeks    Status  Achieved      PT SHORT TERM GOAL #3   Title  Patient will ambulate 10 meters with least assistive device and prosthesis to improve mobility in home.     Baseline  8/14: not walking outside of // bars yet  8/28: 63 seconds with RW and CGA     Time  2    Period  Weeks    Status  Achieved      PT SHORT TERM GOAL #4   Title  Patient will perform STS with single UE support to decrease reliance upon UE's for stability     Baseline  patient requires BUE support 11/5: 1 UR support    Time  2    Period  Weeks    Status  Achieved        PT Long Term Goals - 06/07/18 1618      PT LONG TERM GOAL #1   Title  Patient will ambulate 60 ft with least assistive AD and prosthesis to improve mobility around home and increase independence.     Baseline  8/14: requires use of // bars 8/28: 30 ft with RW and CGA 9/17: 74 ft x2 with CGA and RW     Time  4    Period   Weeks    Status  Achieved      PT LONG TERM GOAL #2   Title  Patient (> 48 years old) will complete five times sit to stand test in <  15 seconds indicating an increased LE strength and improved balance.    Baseline  8/14: 61 seconds 8/28; 42 seconds 9/17: 24 seconds BUE support with LLE 10/10: 17 seconds with excessive BUE support  11/5: 18seconds BUE support; 12/11/17: 12/11/17: 18.5s with BUE support but no AD, 01/22/18: 17.1s BUE support but no AD; 02/26/18: 13.0s with BUE, still unable to perform without UE assistance; 06/07/18: 25.4s with single UE support during first rep only;    Time  12    Period  Weeks    Status  Partially Met    Target Date  08/30/18      PT LONG TERM GOAL #3   Title  Patient will reduce timed up and go to <11 seconds to reduce fall risk and demonstrate improved transfer/gait ability.    Baseline  8/14: unable to perform 8/28: 1 min 30 seconds 9/17: 38 seconds RW 10/10: 25 seconds with RW 11/5: 20 seconds with cane;, 12/11/17: 17.4s with spc; 01/22/18: 14.8s with spc; 02/26/18: 13.0s with spc; 06/07/18: 16.1s with RW;    Time  12    Period  Weeks    Status  Partially Met    Target Date  08/30/18      PT LONG TERM GOAL #4   Title  Patient will increase lower extremity functional scale to >60/80 to demonstrate improved functional mobility and increased tolerance with ADLs.     Baseline  814: 29/80 8/28: 20/80 10/10: 20/80 11/5: 22/80; 12/11/17: 18/80; 01/22/18: 27/80; 06/07/18: 30/80    Time  12    Period  Weeks    Status  Partially Met    Target Date  08/30/18      PT LONG TERM GOAL #5   Title  Patient will increase BLE gross strength to 4+/5 as to improve functional strength for independent gait, increased standing tolerance and increased ADL ability.    Baseline  8/14: 4-/15 8/28: 4-/5  9/17: L 4-/5 R 4/5      Time  4    Period  Weeks    Status  Achieved      PT LONG TERM GOAL #6   Title  Patient will perform 10 MWT in >.5 m/s for improved mobility and safety with  negotiating natural environment    Baseline  8/28: .16 m/s with RW  9/17: .53 m/s;     Time  4    Period  Weeks    Status  Achieved      PT LONG TERM GOAL #7   Title  Patient will perform 10 MWT in >1.0 m/s for improved mobility and safety with negotiating natural environment    Baseline  9/17: .53 m/s 10/10: .63 m/s with RW 11/5: 0.21ms; 12/11/17: 15.33s = 0.65 m/s with spc; 01/22/18: self-selected: 14.4s = 0.69 m/s, fastest: 11.8s = 0.85 m/s; 02/26/18: self-selected: 13.3s = 0.75 m/s, fastest: 10.6s = 0.94 m/s; 06/07/18: self-selected: 14.0s = 0.71 m/s, fastest: 11.7s = 0.85 m/s;    Time  12    Period  Weeks    Status  Partially Met    Target Date  08/30/18      PT LONG TERM GOAL #8   Title  Patient will ambulate 300 ft with least assistive AD and prosthesis to improve mobility around home and increase independence.     Baseline  9/17: 74 ft CGA with RW 10/10 ambulated 280 ft with RW, [facility]: ambulate [id number] 10/31; 01/22/18: 400', 02/26/18: 500'  Time  12    Period  Weeks    Status  Deferred    Target Date  08/30/18      PT LONG TERM GOAL  #9   TITLE  Patient will ascend/descend 4 stairs with rail assist independently without loss of balance to improve ability to get in/out of home.     Baseline  9/17: unable to negotiate stairs 10/10: ascend with step to pattern, BUE support, and CGA 11/5: patient demonstrates ability to perform CGA and BUE support    Time  4    Period  Weeks    Status  Achieved      PT LONG TERM GOAL  #10   TITLE  Patient will ascend/descend 4 stairs with rail assist independently with reciprocating pattern without loss of balance to improve ability to get in/out of home.     Baseline  11/5: step to pattern BUE support CGA; 12/11/17: BUE heavy support and reciprocal pattern;    Time  4    Period  Weeks    Status  Achieved      PT LONG TERM GOAL  #11   TITLE  Patient will ambulate >1051f during 629m with LRAD to demonstrate improved community  ambulation and functional mobility     Baseline  11/5: will complete next session; 01/22/18: 400' in 3 minutes but had to stop secondary to pain in knee; 01/22/18: 400' in 3 minutes limited by R knee pain, 02/26/18: 500' in 3 minutes limited by low back pain; 06/07/18: Deferred due to new prosthetic    Time  12    Period  Weeks    Status  Deferred    Target Date  08/30/18            Plan - 07/12/18 1552    Clinical Impression Statement  Patient's session limited by arriving late to session. Patient progressing with increasing stability on stable and unstable surfaces in a variety of foot/BOS positions. Patient continues to experience occasional low back pain with stability and gait mechanics that he is able to decrease through seated extension interventions. He will continue to benefit from skilled physical therapy to improve functional mobility, improve prosthetic use and increase ambulation to increase LOF and improve overall QOL.    Examination-Participation Restrictions  Interpersonal Relationship    Rehab Potential  Fair    Clinical Impairments Affecting Rehab Potential  (+) previous independence, motivation to return to walking (-) lives alone, hx of diabetes, limited vision     PT Frequency  2x / week    PT Duration  8 weeks    PT Treatment/Interventions  ADLs/Self Care Home Management;Cryotherapy;Electrical Stimulation;Ultrasound;Traction;Moist Heat;DME Instruction;Gait training;Stair training;Functional mobility training;Therapeutic activities;Therapeutic exercise;Patient/family education;Neuromuscular re-education;Balance training;Prosthetic Training;Wheelchair mobility training;Manual techniques;Manual lymph drainage;Compression bandaging;Taping;Energy conservation;Passive range of motion    PT Next Visit Plan  6MWT once pt is comfortable with his new prosthetic, progress standing exercises, gait training, strengthening, balance, gait training with new prosthetic    PT Home Exercise  Plan  see [id number] on medbridge, spc ambulation    Consulted and Agree with Plan of Care  Patient       Patient will benefit from skilled therapeutic intervention in order to improve the following deficits and impairments:  Abnormal gait, Decreased activity tolerance, Decreased balance, Decreased knowledge of precautions, Decreased endurance, Decreased knowledge of use of DME, Decreased mobility, Decreased range of motion, Difficulty walking, Decreased strength, Increased edema, Impaired flexibility, Impaired perceived functional ability, Prosthetic Dependency, Postural dysfunction, Improper body  mechanics, Pain  Visit Diagnosis: 1. Muscle weakness (generalized)   2. Unsteadiness on feet   3. Other abnormalities of gait and mobility   4. Unilateral complete BKA, right, sequela [facility])        Problem List Patient Active Problem List   Diagnosis Date Noted  . Pain due to onychomycosis of toenail of right foot 07/05/2018  . Abnormality of gait 10/12/2017  . Poorly controlled type 2 diabetes mellitus with peripheral neuropathy ([place])   . Flatulence   . Hypomagnesemia   . Unilateral complete BKA, right, sequela ([place])   . Benign essential HTN   . Hypoalbuminemia due to protein-calorie malnutrition ([place])   . S/P below knee amputation, right ([place]) 04/12/2017  . Acute blood loss anemia   . Other encephalopathy 04/08/2017  . Encephalopathy 04/08/2017  . Altered mental status 04/08/2017  . Labile blood pressure   . Labile blood glucose   . Drug induced constipation   . Stage 3 chronic kidney disease ([place])   . Bacteremia   . S/P unilateral BKA (below knee amputation), right ([place]) 04/04/2017  . PAD (peripheral artery disease) ([place])   . Type 2 diabetes mellitus with right diabetic foot ulcer ([place])   . Post-operative pain   . Legally blind   . Upper GI bleed   . Streptococcal bacteremia 04/01/2017  . Hypokalemia 03/30/2017  . Uncontrolled type 2 diabetes mellitus with hyperglycemia, with  long-term current use of insulin ([place]) 03/30/2017  . Type 2 diabetes mellitus with peripheral neuropathy ([place]) 03/30/2017  . AKI (acute kidney injury) ([place]) 03/30/2017  . CKD stage 3 due to type 2 diabetes mellitus ([place]) 03/30/2017  . Sepsis ([place]) 03/30/2017  . Heart murmur 11/22/2016  . Hyperlipidemia associated with type 2 diabetes mellitus ([place]) 11/22/2016  . Obstructive sleep apnea syndrome 11/22/2016  . Essential hypertension 12/17/2012   Janna Arch, PT, DPT   07/12/2018, 3:53 PM  [place] MAIN [facility] SERVICES [address] [place], [place], [address] Phone: [phone]   Fax:  [phone]  Name: Trevor [name] MRN: [id number] Date of Birth: April 15, 1955

## 2018-07-17 ENCOUNTER — Ambulatory Visit: Payer: Medicare Other

## 2018-07-17 ENCOUNTER — Other Ambulatory Visit: Payer: Self-pay

## 2018-07-17 DIAGNOSIS — S88111S Complete traumatic amputation at level between knee and ankle, right lower leg, sequela: Secondary | ICD-10-CM

## 2018-07-17 DIAGNOSIS — R2689 Other abnormalities of gait and mobility: Secondary | ICD-10-CM

## 2018-07-17 DIAGNOSIS — M6281 Muscle weakness (generalized): Secondary | ICD-10-CM | POA: Diagnosis not present

## 2018-07-17 DIAGNOSIS — R2681 Unsteadiness on feet: Secondary | ICD-10-CM

## 2018-07-17 NOTE — Therapy (Signed)
[place] MAIN [facility] SERVICES [address] [place], [place], [address] Phone: [phone]   Fax:  [phone]  Physical Therapy Treatment  Patient Details  Name: Trevor [name] MRN: [id number] Date of Birth: June 27, 1955 Referring Provider (PT): Jamse Arn, DR   Encounter Date: 07/17/2018  PT End of Session - 07/17/18 1501    Visit Number  [id number]    Number of Visits  73    Date for PT Re-Evaluation  08/30/18    Authorization Type  Eval 08/23/17; Last goals 06/07/18    PT Start Time  1301    PT Stop Time  1345    PT Time Calculation (min)  44 min    Equipment Utilized During Treatment  Gait belt   Rt BKA prosthesis   Activity Tolerance  Patient tolerated treatment well;Patient limited by fatigue    Behavior During Therapy  WFL for tasks assessed/performed       Past Medical History:  Diagnosis Date  . Diabetes mellitus without complication ([place])   . Heart murmur   . Hyperlipidemia   . Hypertension   . Sleep apnea     Past Surgical History:  Procedure Laterality Date  . AMPUTATION Right 03/31/2017   Procedure: RIGHT FOOT 1ST AND 2ND RAY AMPUTATION;  Surgeon: Newt Minion, MD;  Location: [place];  Service: Orthopedics;  Laterality: Right;  . AMPUTATION Right 04/01/2017   Procedure: AMPUTATION BELOW KNEE;  Surgeon: Newt Minion, MD;  Location: [place];  Service: Orthopedics;  Laterality: Right;  . COLONOSCOPY WITH PROPOFOL N/A 10/02/2017   Procedure: COLONOSCOPY WITH PROPOFOL;  Surgeon: [name], MD;  Location: [facility] ENDOSCOPY;  Service: Gastroenterology;  Laterality: N/A;  . CORNEAL TRANSPLANT      There were no vitals filed for this visit.  Subjective Assessment - 07/17/18 1455    Subjective  Patient reports he had a good holiday. Is feeling stronger and more steady but continues to have difficulty bringing groceries in and carrying his gym bag.    Pertinent History  63 year old right-handed male with history of diabetes  mellitus, hypertension, legally blind, and CKD presenting for BKA 04/01/17. Patient wears contacts and can see better now.  Patient had two surgeries, one was a couple toes then they decided to go below the knee. 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 Has ambulated with RW in hospital with PT. Patient has had home health therapy and was in inpatient rehab about a month. Patient reports he was mainly sitting down, with occasional walking, last home health therapy in May. Patient reports some standing up but no exercises. Patient lives alone in the house and is scared they might fall. Patient received prosthetic leg Tuesday of last week.    Limitations  Standing;Walking;House hold activities;Other (comment)    How long can you stand comfortably?  10-15 minutes    How long can you walk comfortably?  10-15 minutes    Patient Stated Goals  going up steps and driving car.     Currently in Pain?  No/denies         Treatment:  TherEx Ambulate with SPC and carrying 9lb weight in opposite hand to mimic carrying groceries, initially performed in // bars x 3 trials and then outside // bars for 100 ft x 2 trials with noticeable fatigue. Patient requires CGA and cueing for upright posture  Quantum leg  press single leg (RLE) 105# x 15, 2 sets ; cueing for keeping slight bent in knee with decreasing velocity for increased muscle recruitment.   Quantum leg press BLE 210 x15 2 sets  cueing for keeping slight bent in knee with decreasing velocity for increased muscle recruitment.   Seated IR/ER 15x each LE   Seated 2" step toe taps for coordination and muscle recruitment. 2x 30 second trials.   Neuro Re-ed  single leg sit to stands with RLE, LLE tip toe support with bilateral hand hold support each UE. 8x, 2 sets   airex pad: 6" step toe taps no UE support 15x each LE, CGA due to occasional LOB requiring assistance to retain COM  airex  pad: saebo ball transfer up/down each rung reaching outside BOS with alternating arms x 3 trials.                   PT Education - 07/17/18 1456    Education Details  exercise technique, stability, body mechanics, carrying    Person(s) Educated  Patient    Methods  Explanation;Demonstration;Tactile cues;Verbal cues    Comprehension  Verbalized understanding;Returned demonstration;Verbal cues required;Tactile cues required       PT Short Term Goals - 06/07/18 1617      PT SHORT TERM GOAL #1   Title  Patient will be independent in home exercise program to improve strength/mobility for better functional independence with ADLs.    Baseline  HEP given, 12/11/17: Performing daily:     Time  2    Period  Weeks    Status  Achieved      PT SHORT TERM GOAL #2   Title  Patient will don/doff prosthesis independently to allow for increased mobility in home.     Baseline  8/14: requires PT to guide/direct through process 8/28: independent    Time  2    Period  Weeks    Status  Achieved      PT SHORT TERM GOAL #3   Title  Patient will ambulate 10 meters with least assistive device and prosthesis to improve mobility in home.     Baseline  8/14: not walking outside of // bars yet  8/28: 63 seconds with RW and CGA     Time  2    Period  Weeks    Status  Achieved      PT SHORT TERM GOAL #4   Title  Patient will perform STS with single UE support to decrease reliance upon UE's for stability     Baseline  patient requires BUE support 11/5: 1 UR support    Time  2    Period  Weeks    Status  Achieved        PT Long Term Goals - 06/07/18 1618      PT LONG TERM GOAL #1   Title  Patient will ambulate 60 ft with least assistive AD and prosthesis to improve mobility around home and increase independence.     Baseline  8/14: requires use of // bars 8/28: 30 ft with RW and CGA 9/17: 74 ft x2 with CGA and RW     Time  4    Period  Weeks    Status  Achieved      PT LONG TERM GOAL  #2   Title  Patient (> 77 years old) will complete five times sit to stand test in < 15 seconds indicating an increased LE strength and improved balance.  Baseline  8/14: 61 seconds 8/28; 42 seconds 9/17: 24 seconds BUE support with LLE 10/10: 17 seconds with excessive BUE support  11/5: 18seconds BUE support; 12/11/17: 12/11/17: 18.5s with BUE support but no AD, 01/22/18: 17.1s BUE support but no AD; 02/26/18: 13.0s with BUE, still unable to perform without UE assistance; 06/07/18: 25.4s with single UE support during first rep only;    Time  12    Period  Weeks    Status  Partially Met    Target Date  08/30/18      PT LONG TERM GOAL #3   Title  Patient will reduce timed up and go to <11 seconds to reduce fall risk and demonstrate improved transfer/gait ability.    Baseline  8/14: unable to perform 8/28: 1 min 30 seconds 9/17: 38 seconds RW 10/10: 25 seconds with RW 11/5: 20 seconds with cane;, 12/11/17: 17.4s with spc; 01/22/18: 14.8s with spc; 02/26/18: 13.0s with spc; 06/07/18: 16.1s with RW;    Time  12    Period  Weeks    Status  Partially Met    Target Date  08/30/18      PT LONG TERM GOAL #4   Title  Patient will increase lower extremity functional scale to >60/80 to demonstrate improved functional mobility and increased tolerance with ADLs.     Baseline  814: 29/80 8/28: 20/80 10/10: 20/80 11/5: 22/80; 12/11/17: 18/80; 01/22/18: 27/80; 06/07/18: 30/80    Time  12    Period  Weeks    Status  Partially Met    Target Date  08/30/18      PT LONG TERM GOAL #5   Title  Patient will increase BLE gross strength to 4+/5 as to improve functional strength for independent gait, increased standing tolerance and increased ADL ability.    Baseline  8/14: 4-/15 8/28: 4-/5  9/17: L 4-/5 R 4/5      Time  4    Period  Weeks    Status  Achieved      PT LONG TERM GOAL #6   Title  Patient will perform 10 MWT in >.5 m/s for improved mobility and safety with negotiating natural environment    Baseline  8/28:  .16 m/s with RW  9/17: .53 m/s;     Time  4    Period  Weeks    Status  Achieved      PT LONG TERM GOAL #7   Title  Patient will perform 10 MWT in >1.0 m/s for improved mobility and safety with negotiating natural environment    Baseline  9/17: .53 m/s 10/10: .63 m/s with RW 11/5: 0.96ms; 12/11/17: 15.33s = 0.65 m/s with spc; 01/22/18: self-selected: 14.4s = 0.69 m/s, fastest: 11.8s = 0.85 m/s; 02/26/18: self-selected: 13.3s = 0.75 m/s, fastest: 10.6s = 0.94 m/s; 06/07/18: self-selected: 14.0s = 0.71 m/s, fastest: 11.7s = 0.85 m/s;    Time  12    Period  Weeks    Status  Partially Met    Target Date  08/30/18      PT LONG TERM GOAL #8   Title  Patient will ambulate 300 ft with least assistive AD and prosthesis to improve mobility around home and increase independence.     Baseline  9/17: 74 ft CGA with RW 10/10 ambulated 280 ft with RW, [facility]11/5: ambulate 4671fon 10/31; 01/22/18: 400', 02/26/18: 500'    Time  12    Period  Weeks  Status  Deferred    Target Date  08/30/18      PT LONG TERM GOAL  #9   TITLE  Patient will ascend/descend 4 stairs with rail assist independently without loss of balance to improve ability to get in/out of home.     Baseline  9/17: unable to negotiate stairs 10/10: ascend with step to pattern, BUE support, and CGA 11/5: patient demonstrates ability to perform CGA and BUE support    Time  4    Period  Weeks    Status  Achieved      PT LONG TERM GOAL  #10   TITLE  Patient will ascend/descend 4 stairs with rail assist independently with reciprocating pattern without loss of balance to improve ability to get in/out of home.     Baseline  11/5: step to pattern BUE support CGA; 12/11/17: BUE heavy support and reciprocal pattern;    Time  4    Period  Weeks    Status  Achieved      PT LONG TERM GOAL  #11   TITLE  Patient will ambulate >1030f during 655m with LRAD to demonstrate improved community ambulation and functional mobility     Baseline  11/5:  will complete next session; 01/22/18: 400' in 3 minutes but had to stop secondary to pain in knee; 01/22/18: 400' in 3 minutes limited by R knee pain, 02/26/18: 500' in 3 minutes limited by low back pain; 06/07/18: Deferred due to new prosthetic    Time  12    Period  Weeks    Status  Deferred    Target Date  08/30/18            Plan - 07/17/18 1502    Clinical Impression Statement  Patient is challenged with ambulating and dual tasking with carrying objects due to the mass changing his COM. Continued task orientation to assist in replicating challenging tasks at home will benefit patient at this time. Patient is challenged with single limb tasks on prosthetic limb however does demonstrate progression with increasing strength. He will continue to benefit from skilled physical therapy to improve functional mobility, improve prosthetic use and increase ambulation to increase LOF and improve overall QOL.    Examination-Participation Restrictions  Interpersonal Relationship    Rehab Potential  Fair    Clinical Impairments Affecting Rehab Potential  (+) previous independence, motivation to return to walking (-) lives alone, hx of diabetes, limited vision     PT Frequency  2x / week    PT Duration  8 weeks    PT Treatment/Interventions  ADLs/Self Care Home Management;Cryotherapy;Electrical Stimulation;Ultrasound;Traction;Moist Heat;DME Instruction;Gait training;Stair training;Functional mobility training;Therapeutic activities;Therapeutic exercise;Patient/family education;Neuromuscular re-education;Balance training;Prosthetic Training;Wheelchair mobility training;Manual techniques;Manual lymph drainage;Compression bandaging;Taping;Energy conservation;Passive range of motion    PT Next Visit Plan  walking with bags    PT Home Exercise Plan  see [id number] on medbridge, spc ambulation    Consulted and Agree with Plan of Care  Patient       Patient will benefit from skilled therapeutic intervention in  order to improve the following deficits and impairments:  Abnormal gait, Decreased activity tolerance, Decreased balance, Decreased knowledge of precautions, Decreased endurance, Decreased knowledge of use of DME, Decreased mobility, Decreased range of motion, Difficulty walking, Decreased strength, Increased edema, Impaired flexibility, Impaired perceived functional ability, Prosthetic Dependency, Postural dysfunction, Improper body mechanics, Pain  Visit Diagnosis: 1. Muscle weakness (generalized)   2. Unsteadiness on feet   3. Other abnormalities of gait and mobility  4. Unilateral complete BKA, right, sequela [facility])        Problem List Patient Active Problem List   Diagnosis Date Noted  . Pain due to onychomycosis of toenail of right foot 07/05/2018  . Abnormality of gait 10/12/2017  . Poorly controlled type 2 diabetes mellitus with peripheral neuropathy ([place])   . Flatulence   . Hypomagnesemia   . Unilateral complete BKA, right, sequela ([place])   . Benign essential HTN   . Hypoalbuminemia due to protein-calorie malnutrition ([place])   . S/P below knee amputation, right ([place]) 04/12/2017  . Acute blood loss anemia   . Other encephalopathy 04/08/2017  . Encephalopathy 04/08/2017  . Altered mental status 04/08/2017  . Labile blood pressure   . Labile blood glucose   . Drug induced constipation   . Stage 3 chronic kidney disease ([place])   . Bacteremia   . S/P unilateral BKA (below knee amputation), right ([place]) 04/04/2017  . PAD (peripheral artery disease) ([place])   . Type 2 diabetes mellitus with right diabetic foot ulcer ([place])   . Post-operative pain   . Legally blind   . Upper GI bleed   . Streptococcal bacteremia 04/01/2017  . Hypokalemia 03/30/2017  . Uncontrolled type 2 diabetes mellitus with hyperglycemia, with long-term current use of insulin ([place]) 03/30/2017  . Type 2 diabetes mellitus with peripheral neuropathy ([place]) 03/30/2017  . AKI (acute kidney injury) ([place]) 03/30/2017   . CKD stage 3 due to type 2 diabetes mellitus ([place]) 03/30/2017  . Sepsis ([place]) 03/30/2017  . Heart murmur 11/22/2016  . Hyperlipidemia associated with type 2 diabetes mellitus ([place]) 11/22/2016  . Obstructive sleep apnea syndrome 11/22/2016  . Essential hypertension 12/17/2012   Janna Arch, PT, DPT   07/17/2018, 3:04 PM  [place] MAIN [facility] SERVICES [address] [place], [place], [address] Phone: [phone]   Fax:  [phone]  Name: Trevor [name] MRN: [id number] Date of Birth: 1955-05-18

## 2018-07-23 ENCOUNTER — Other Ambulatory Visit: Payer: Self-pay

## 2018-07-23 ENCOUNTER — Ambulatory Visit: Payer: Medicare Other

## 2018-07-23 VITALS — BP 141/70 | HR 63

## 2018-07-23 DIAGNOSIS — M6281 Muscle weakness (generalized): Secondary | ICD-10-CM

## 2018-07-23 DIAGNOSIS — S88111S Complete traumatic amputation at level between knee and ankle, right lower leg, sequela: Secondary | ICD-10-CM

## 2018-07-23 DIAGNOSIS — R2689 Other abnormalities of gait and mobility: Secondary | ICD-10-CM

## 2018-07-23 DIAGNOSIS — R2681 Unsteadiness on feet: Secondary | ICD-10-CM

## 2018-07-23 NOTE — Therapy (Signed)
[place] MAIN [facility] SERVICES [address] [place], [place], [address] Phone: [phone]   Fax:  [phone]  Physical Therapy Treatment  Patient Details  Name: Trevor [name] MRN: [id number] Date of Birth: [birth year] Referring Provider (PT): Jamse Arn, DR   Encounter Date: 07/23/2018  PT End of Session - 07/23/18 1708    Visit Number  2    Number of Visits  73    Date for PT Re-Evaluation  08/30/18    Authorization Type  Eval 08/23/17; Last goals 06/07/18    PT Start Time  1442    PT Stop Time  1515    PT Time Calculation (min)  33 min    Equipment Utilized During Treatment  Gait belt   Rt BKA prosthesis   Activity Tolerance  Patient tolerated treatment well;Patient limited by fatigue    Behavior During Therapy  WFL for tasks assessed/performed       Past Medical History:  Diagnosis Date  . Diabetes mellitus without complication ([place])   . Heart murmur   . Hyperlipidemia   . Hypertension   . Sleep apnea     Past Surgical History:  Procedure Laterality Date  . AMPUTATION Right 03/31/2017   Procedure: RIGHT FOOT 1ST AND 2ND RAY AMPUTATION;  Surgeon: Newt Minion, MD;  Location: [place];  Service: Orthopedics;  Laterality: Right;  . AMPUTATION Right 04/01/2017   Procedure: AMPUTATION BELOW KNEE;  Surgeon: Newt Minion, MD;  Location: [place];  Service: Orthopedics;  Laterality: Right;  . COLONOSCOPY WITH PROPOFOL N/A 10/02/2017   Procedure: COLONOSCOPY WITH PROPOFOL;  Surgeon: [name], MD;  Location: [facility] ENDOSCOPY;  Service: Gastroenterology;  Laterality: N/A;  . CORNEAL TRANSPLANT      Vitals:   07/23/18 1705  BP: (!) 141/70  Pulse: 63    Subjective Assessment - 07/23/18 1705    Subjective  Pt states that he is doing well today.  He reports no pain or soreness after last session.  No new questions or concerns at this time.    Pertinent History  63 year old right-handed male with history of diabetes mellitus,  hypertension, legally blind, and CKD presenting for BKA 04/01/17. Patient wears contacts and can see better now.  Patient had two surgeries, one was a couple toes then they decided to go below the knee. 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 Has ambulated with RW in hospital with PT. Patient has had home health therapy and was in inpatient rehab about a month. Patient reports he was mainly sitting down, with occasional walking, last home health therapy in May. Patient reports some standing up but no exercises. Patient lives alone in the house and is scared they might fall. Patient received prosthetic leg Tuesday of last week.    Limitations  Standing;Walking;House hold activities;Other (comment)    How long can you stand comfortably?  10-15 minutes    How long can you walk comfortably?  10-15 minutes    Patient Stated Goals  going up steps and driving car.     Currently in Pain?  No/denies       Treatment:   TherEx Ambulated with intermittent use of SPC >1000 ft.  CGA with no LOBs.  Increased use of SPC towards end due to fatigue.     Neuro Re-ed  Lateral stepping on foam balance beam in // bars x5 in  each direction.  CGA and intermittent UE support for steadying, pt fatigued and required sitting break after  Tandem gait in // bars x8 forward, x3 backwards.  CGA and intermittent UE support for steadying, with increasing need of UE support going backwards.  STSs from normal height chair with minA at L hip to assist with weight shift.  Verbal and tactile cueing for technique. Pt with poor eccentric control on stand to sit, relying heavily on PT/table stabilizing chair.    Pt demonstrates good motivation during session today.  Pt was able to ambulate >1000 ft with intermittent use of SPC, becoming more reliant on SPCduring the last 1/3 of the lap.  Pt demonstrates difficulty with shifting weight over his prosthesis today with  STSs.  He required minA at his L hip to initiate weight shift, as well as increased verbal cueing to keep his weight forward and to his R. Pt often fatigued with exercises but remained motivated Pt will benefit from continued PT services to address balance strength, and functional mobility in order to improve his overall QoL.       PT Education - 07/23/18 1707    Education Details  exercise technique, stability, body mechanics    Person(s) Educated  Patient    Methods  Explanation;Demonstration;Verbal cues;Tactile cues    Comprehension  Verbalized understanding;Returned demonstration       PT Short Term Goals - 06/07/18 1617      PT SHORT TERM GOAL #1   Title  Patient will be independent in home exercise program to improve strength/mobility for better functional independence with ADLs.    Baseline  HEP given, 12/11/17: Performing daily:     Time  2    Period  Weeks    Status  Achieved      PT SHORT TERM GOAL #2   Title  Patient will don/doff prosthesis independently to allow for increased mobility in home.     Baseline  8/14: requires PT to guide/direct through process 8/28: independent    Time  2    Period  Weeks    Status  Achieved      PT SHORT TERM GOAL #3   Title  Patient will ambulate 10 meters with least assistive device and prosthesis to improve mobility in home.     Baseline  8/14: not walking outside of // bars yet  8/28: 63 seconds with RW and CGA     Time  2    Period  Weeks    Status  Achieved      PT SHORT TERM GOAL #4   Title  Patient will perform STS with single UE support to decrease reliance upon UE's for stability     Baseline  patient requires BUE support 11/5: 1 UR support    Time  2    Period  Weeks    Status  Achieved        PT Long Term Goals - 06/07/18 1618      PT LONG TERM GOAL #1   Title  Patient will ambulate 60 ft with least assistive AD and prosthesis to improve mobility around home and increase independence.     Baseline  8/14: requires  use of // bars 8/28: 30 ft with RW and CGA 9/17: 74 ft x2 with CGA and RW     Time  4    Period  Weeks    Status  Achieved      PT LONG TERM GOAL #2  Title  Patient (> 42 years old) will complete five times sit to stand test in < 15 seconds indicating an increased LE strength and improved balance.    Baseline  8/14: 61 seconds 8/28; 42 seconds 9/17: 24 seconds BUE support with LLE 10/10: 17 seconds with excessive BUE support  11/5: 18seconds BUE support; 12/11/17: 12/11/17: 18.5s with BUE support but no AD, 01/22/18: 17.1s BUE support but no AD; 02/26/18: 13.0s with BUE, still unable to perform without UE assistance; 06/07/18: 25.4s with single UE support during first rep only;    Time  12    Period  Weeks    Status  Partially Met    Target Date  08/30/18      PT LONG TERM GOAL #3   Title  Patient will reduce timed up and go to <11 seconds to reduce fall risk and demonstrate improved transfer/gait ability.    Baseline  8/14: unable to perform 8/28: 1 min 30 seconds 9/17: 38 seconds RW 10/10: 25 seconds with RW 11/5: 20 seconds with cane;, 12/11/17: 17.4s with spc; 01/22/18: 14.8s with spc; 02/26/18: 13.0s with spc; 06/07/18: 16.1s with RW;    Time  12    Period  Weeks    Status  Partially Met    Target Date  08/30/18      PT LONG TERM GOAL #4   Title  Patient will increase lower extremity functional scale to >60/80 to demonstrate improved functional mobility and increased tolerance with ADLs.     Baseline  814: 29/80 8/28: 20/80 10/10: 20/80 11/5: 22/80; 12/11/17: 18/80; 01/22/18: 27/80; 06/07/18: 30/80    Time  12    Period  Weeks    Status  Partially Met    Target Date  08/30/18      PT LONG TERM GOAL #5   Title  Patient will increase BLE gross strength to 4+/5 as to improve functional strength for independent gait, increased standing tolerance and increased ADL ability.    Baseline  8/14: 4-/15 8/28: 4-/5  9/17: L 4-/5 R 4/5      Time  4    Period  Weeks    Status  Achieved      PT LONG  TERM GOAL #6   Title  Patient will perform 10 MWT in >.5 m/s for improved mobility and safety with negotiating natural environment    Baseline  8/28: .16 m/s with RW  9/17: .53 m/s;     Time  4    Period  Weeks    Status  Achieved      PT LONG TERM GOAL #7   Title  Patient will perform 10 MWT in >1.0 m/s for improved mobility and safety with negotiating natural environment    Baseline  9/17: .53 m/s 10/10: .63 m/s with RW 11/5: 0.59ms; 12/11/17: 15.33s = 0.65 m/s with spc; 01/22/18: self-selected: 14.4s = 0.69 m/s, fastest: 11.8s = 0.85 m/s; 02/26/18: self-selected: 13.3s = 0.75 m/s, fastest: 10.6s = 0.94 m/s; 06/07/18: self-selected: 14.0s = 0.71 m/s, fastest: 11.7s = 0.85 m/s;    Time  12    Period  Weeks    Status  Partially Met    Target Date  08/30/18      PT LONG TERM GOAL #8   Title  Patient will ambulate 300 ft with least assistive AD and prosthesis to improve mobility around home and increase independence.     Baseline  9/17: 74 ft CGA with RW 10/10 ambulated 280 ft  with RW, 70 ft with [facility] 11/5: ambulate 446f on 10/31; 01/22/18: 400', 02/26/18: 500'    Time  12    Period  Weeks    Status  Deferred    Target Date  08/30/18      PT LONG TERM GOAL  #9   TITLE  Patient will ascend/descend 4 stairs with rail assist independently without loss of balance to improve ability to get in/out of home.     Baseline  9/17: unable to negotiate stairs 10/10: ascend with step to pattern, BUE support, and CGA 11/5: patient demonstrates ability to perform CGA and BUE support    Time  4    Period  Weeks    Status  Achieved      PT LONG TERM GOAL  #10   TITLE  Patient will ascend/descend 4 stairs with rail assist independently with reciprocating pattern without loss of balance to improve ability to get in/out of home.     Baseline  11/5: step to pattern BUE support CGA; 12/11/17: BUE heavy support and reciprocal pattern;    Time  4    Period  Weeks    Status  Achieved      PT LONG TERM GOAL  #11    TITLE  Patient will ambulate >10021fduring 63m47mwith LRAD to demonstrate improved community ambulation and functional mobility     Baseline  11/5: will complete next session; 01/22/18: 400' in 3 minutes but had to stop secondary to pain in knee; 01/22/18: 400' in 3 minutes limited by R knee pain, 02/26/18: 500' in 3 minutes limited by low back pain; 06/07/18: Deferred due to new prosthetic    Time  12    Period  Weeks    Status  Deferred    Target Date  08/30/18              Patient will benefit from skilled therapeutic intervention in order to improve the following deficits and impairments:     Visit Diagnosis: 1. Muscle weakness (generalized)   2. Unsteadiness on feet   3. Other abnormalities of gait and mobility   4. Unilateral complete BKA, right, sequela ([facility]      Problem List Patient Active Problem List   Diagnosis Date Noted  . Pain due to onychomycosis of toenail of right foot 07/05/2018  . Abnormality of gait 10/12/2017  . Poorly controlled type 2 diabetes mellitus with peripheral neuropathy ([place]CC[place] . Flatulence   . Hypomagnesemia   . Unilateral complete BKA, right, sequela ([place]CC[place] . Benign essential HTN   . Hypoalbuminemia due to protein-calorie malnutrition ([facility]CCSt[place] . S/P below knee amputation, right (HCCNew [place]4/03/2017  . Acute blood loss anemia   . Other encephalopathy 04/08/2017  . Encephalopathy 04/08/2017  . Altered mental status 04/08/2017  . Labile blood pressure   . Labile blood glucose   . Drug induced constipation   . Stage 3 chronic kidney disease ([place]CC[place] . Bacteremia   . S/P unilateral BKA (below knee amputation), right (HCCWeaubleau3/26/2019  . PAD (peripheral artery disease) ([place]CCM[place] . Type 2 diabetes mellitus with right diabetic foot ulcer (HCCEm[place] . Post-operative pain   . Legally blind   . Upper GI bleed   . Streptococcal bacteremia 04/01/2017  . Hypokalemia 03/30/2017  . Uncontrolled type 2 diabetes mellitus with hyperglycemia, with  long-term current use of insulin (HCCOildale3/21/2019  . Type 2 diabetes mellitus with peripheral neuropathy (  [place]) 03/30/2017  . AKI (acute kidney injury) ([place]) 03/30/2017  . CKD stage 3 due to type 2 diabetes mellitus ([place]) 03/30/2017  . Sepsis ([place]) 03/30/2017  . Heart murmur 11/22/2016  . Hyperlipidemia associated with type 2 diabetes mellitus ([place]) 11/22/2016  . Obstructive sleep apnea syndrome 11/22/2016  . Essential hypertension 12/17/2012    Lutricia Horsfall, SPT   Lieutenant Diego PT, DPT 12:59 PM,07/24/18 [phone]  This entire session was performed under direct supervision and direction of a licensed therapist/therapist assistant . I have personally read, edited and approve of the note as written.   [place] MAIN [facility] SERVICES [address] [place], [place], [address] Phone: [phone]   Fax:  [phone]  Name: Trevor [name] MRN: [id number] Date of Birth: August 14, 1955

## 2018-07-25 ENCOUNTER — Other Ambulatory Visit: Payer: Self-pay

## 2018-07-25 ENCOUNTER — Ambulatory Visit: Payer: Medicare Other

## 2018-07-25 DIAGNOSIS — R2681 Unsteadiness on feet: Secondary | ICD-10-CM

## 2018-07-25 DIAGNOSIS — M6281 Muscle weakness (generalized): Secondary | ICD-10-CM

## 2018-07-25 DIAGNOSIS — R2689 Other abnormalities of gait and mobility: Secondary | ICD-10-CM

## 2018-07-25 NOTE — Therapy (Signed)
[place] MAIN [facility] SERVICES [address] [place], [place], [address] Phone: [phone]   Fax:  [phone]  Physical Therapy Treatment  Patient Details  Name: Trevor [name] MRN: [id number] Date of Birth: May 08, 1955 Referring Provider (PT): Jamse Arn, DR   Encounter Date: 07/25/2018  PT End of Session - 07/25/18 1500    Visit Number  [id number]    Number of Visits  73    Date for PT Re-Evaluation  08/30/18    Authorization Type  Eval 08/23/17; Last goals 06/07/18    PT Start Time  1433    PT Stop Time  1514    PT Time Calculation (min)  41 min    Equipment Utilized During Treatment  Gait belt   Rt BKA prosthesis   Activity Tolerance  Patient tolerated treatment well;Patient limited by fatigue    Behavior During Therapy  WFL for tasks assessed/performed       Past Medical History:  Diagnosis Date  . Diabetes mellitus without complication ([place])   . Heart murmur   . Hyperlipidemia   . Hypertension   . Sleep apnea     Past Surgical History:  Procedure Laterality Date  . AMPUTATION Right 03/31/2017   Procedure: RIGHT FOOT 1ST AND 2ND RAY AMPUTATION;  Surgeon: Newt Minion, MD;  Location: [place];  Service: Orthopedics;  Laterality: Right;  . AMPUTATION Right 04/01/2017   Procedure: AMPUTATION BELOW KNEE;  Surgeon: Newt Minion, MD;  Location: [place];  Service: Orthopedics;  Laterality: Right;  . COLONOSCOPY WITH PROPOFOL N/A 10/02/2017   Procedure: COLONOSCOPY WITH PROPOFOL;  Surgeon: [name], MD;  Location: [facility] ENDOSCOPY;  Service: Gastroenterology;  Laterality: N/A;  . CORNEAL TRANSPLANT      There were no vitals filed for this visit.  Subjective Assessment - 07/25/18 1458    Subjective  Patient reports no pain, no falls since last session. No questions or concerns.    Pertinent History  63 year old right-handed male with history of diabetes mellitus, hypertension, legally blind, and CKD presenting for BKA 04/01/17. Patient  wears contacts and can see better now.  Patient had two surgeries, one was a couple toes then they decided to go below the knee. 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 Has ambulated with RW in hospital with PT. Patient has had home health therapy and was in inpatient rehab about a month. Patient reports he was mainly sitting down, with occasional walking, last home health therapy in May. Patient reports some standing up but no exercises. Patient lives alone in the house and is scared they might fall. Patient received prosthetic leg Tuesday of last week.    Limitations  Standing;Walking;House hold activities;Other (comment)    How long can you stand comfortably?  10-15 minutes    How long can you walk comfortably?  10-15 minutes    Patient Stated Goals  going up steps and driving car.     Currently in Pain?  No/denies      Treatment:  ambulate 85 ft with R arm holding grocery bags with increasing weight and SPC in LUE. ; single bag 1lb, single back 2lb, double bag 3lb total. Simulation of home environment for increased mobility in safe manner at home.   Lifting grocery bag with increasing weights onto table to simulate home set up.     airex pad: 6" step toe taps  no UE support 15x each LE, CGA due to occasional LOB requiring assistance to retain COM   airex pad 6" lateral toe tap 15x each LE, no UE support, CGA with Min A to retain COM due to occasional LOB.   airex pad: modified tandem stance 30 seconds each LE back 2 trials each LE for 4 trials total.    Pt educated throughout session about proper posture and technique with exercises. Improved exercise technique, movement at target joints, use of target muscles after min to mod verbal, visual, tactile cues.              PT Education - 07/25/18 1500    Education Details  exercise technique, stability ambulating with grocery bags.    Person(s) Educated   Patient    Methods  Explanation;Demonstration;Tactile cues;Verbal cues    Comprehension  Verbalized understanding;Returned demonstration;Verbal cues required;Tactile cues required;Need further instruction       PT Short Term Goals - 06/07/18 1617      PT SHORT TERM GOAL #1   Title  Patient will be independent in home exercise program to improve strength/mobility for better functional independence with ADLs.    Baseline  HEP given, 12/11/17: Performing daily:     Time  2    Period  Weeks    Status  Achieved      PT SHORT TERM GOAL #2   Title  Patient will don/doff prosthesis independently to allow for increased mobility in home.     Baseline  8/14: requires PT to guide/direct through process 8/28: independent    Time  2    Period  Weeks    Status  Achieved      PT SHORT TERM GOAL #3   Title  Patient will ambulate 10 meters with least assistive device and prosthesis to improve mobility in home.     Baseline  8/14: not walking outside of // bars yet  8/28: 63 seconds with RW and CGA     Time  2    Period  Weeks    Status  Achieved      PT SHORT TERM GOAL #4   Title  Patient will perform STS with single UE support to decrease reliance upon UE's for stability     Baseline  patient requires BUE support 11/5: 1 UR support    Time  2    Period  Weeks    Status  Achieved        PT Long Term Goals - 06/07/18 1618      PT LONG TERM GOAL #1   Title  Patient will ambulate 60 ft with least assistive AD and prosthesis to improve mobility around home and increase independence.     Baseline  8/14: requires use of // bars 8/28: 30 ft with RW and CGA 9/17: 74 ft x2 with CGA and RW     Time  4    Period  Weeks    Status  Achieved      PT LONG TERM GOAL #2   Title  Patient (> 77 years old) will complete five times sit to stand test in < 15 seconds indicating an increased LE strength and improved balance.    Baseline  8/14: 61 seconds 8/28; 42 seconds 9/17: 24 seconds BUE support with LLE  10/10: 17 seconds with excessive BUE support  11/5: 18seconds BUE support; 12/11/17: 12/11/17: 18.5s with BUE support but no AD, 01/22/18: 17.1s BUE support but no AD; 02/26/18: 13.0s  with BUE, still unable to perform without UE assistance; 06/07/18: 25.4s with single UE support during first rep only;    Time  12    Period  Weeks    Status  Partially Met    Target Date  08/30/18      PT LONG TERM GOAL #3   Title  Patient will reduce timed up and go to <11 seconds to reduce fall risk and demonstrate improved transfer/gait ability.    Baseline  8/14: unable to perform 8/28: 1 min 30 seconds 9/17: 38 seconds RW 10/10: 25 seconds with RW 11/5: 20 seconds with cane;, 12/11/17: 17.4s with spc; 01/22/18: 14.8s with spc; 02/26/18: 13.0s with spc; 06/07/18: 16.1s with RW;    Time  12    Period  Weeks    Status  Partially Met    Target Date  08/30/18      PT LONG TERM GOAL #4   Title  Patient will increase lower extremity functional scale to >60/80 to demonstrate improved functional mobility and increased tolerance with ADLs.     Baseline  814: 29/80 8/28: 20/80 10/10: 20/80 11/5: 22/80; 12/11/17: 18/80; 01/22/18: 27/80; 06/07/18: 30/80    Time  12    Period  Weeks    Status  Partially Met    Target Date  08/30/18      PT LONG TERM GOAL #5   Title  Patient will increase BLE gross strength to 4+/5 as to improve functional strength for independent gait, increased standing tolerance and increased ADL ability.    Baseline  8/14: 4-/15 8/28: 4-/5  9/17: L 4-/5 R 4/5      Time  4    Period  Weeks    Status  Achieved      PT LONG TERM GOAL #6   Title  Patient will perform 10 MWT in >.5 m/s for improved mobility and safety with negotiating natural environment    Baseline  8/28: .16 m/s with RW  9/17: .53 m/s;     Time  4    Period  Weeks    Status  Achieved      PT LONG TERM GOAL #7   Title  Patient will perform 10 MWT in >1.0 m/s for improved mobility and safety with negotiating natural environment     Baseline  9/17: .53 m/s 10/10: .63 m/s with RW 11/5: 0.9ms; 12/11/17: 15.33s = 0.65 m/s with spc; 01/22/18: self-selected: 14.4s = 0.69 m/s, fastest: 11.8s = 0.85 m/s; 02/26/18: self-selected: 13.3s = 0.75 m/s, fastest: 10.6s = 0.94 m/s; 06/07/18: self-selected: 14.0s = 0.71 m/s, fastest: 11.7s = 0.85 m/s;    Time  12    Period  Weeks    Status  Partially Met    Target Date  08/30/18      PT LONG TERM GOAL #8   Title  Patient will ambulate 300 ft with least assistive AD and prosthesis to improve mobility around home and increase independence.     Baseline  9/17: 74 ft CGA with RW 10/10 ambulated 280 ft with RW, 70 ft with SPC 11/5: ambulate 4659fon 10/31; 01/22/18: 400', 02/26/18: 500'    Time  12    Period  Weeks    Status  Deferred    Target Date  08/30/18      PT LONG TERM GOAL  #9   TITLE  Patient will ascend/descend 4 stairs with rail assist independently without loss of balance to improve ability to get in/out of  home.     Baseline  9/17: unable to negotiate stairs 10/10: ascend with step to pattern, BUE support, and CGA 11/5: patient demonstrates ability to perform CGA and BUE support    Time  4    Period  Weeks    Status  Achieved      PT LONG TERM GOAL  #10   TITLE  Patient will ascend/descend 4 stairs with rail assist independently with reciprocating pattern without loss of balance to improve ability to get in/out of home.     Baseline  11/5: step to pattern BUE support CGA; 12/11/17: BUE heavy support and reciprocal pattern;    Time  4    Period  Weeks    Status  Achieved      PT LONG TERM GOAL  #11   TITLE  Patient will ambulate >1021f during 681m with LRAD to demonstrate improved community ambulation and functional mobility     Baseline  11/5: will complete next session; 01/22/18: 400' in 3 minutes but had to stop secondary to pain in knee; 01/22/18: 400' in 3 minutes limited by R knee pain, 02/26/18: 500' in 3 minutes limited by low back pain; 06/07/18: Deferred due to new  prosthetic    Time  12    Period  Weeks    Status  Deferred    Target Date  08/30/18            Plan - 07/25/18 1510    Clinical Impression Statement  Patient appears not his vibrant self. When questioned reports everything ok. Informed patient that this physical therapist is here if he needs to talk/wants to talk. Patient is challenged with single limb tasks on prosthetic limb however does demonstrate progression with increasing strength. Pt often fatigued with exercises but remained motivated Pt will benefit from continued PT services to address balance strength, and functional mobility in order to improve his overall QoL.    Examination-Participation Restrictions  Interpersonal Relationship    Rehab Potential  Fair    Clinical Impairments Affecting Rehab Potential  (+) previous independence, motivation to return to walking (-) lives alone, hx of diabetes, limited vision     PT Frequency  2x / week    PT Duration  8 weeks    PT Treatment/Interventions  ADLs/Self Care Home Management;Cryotherapy;Electrical Stimulation;Ultrasound;Traction;Moist Heat;DME Instruction;Gait training;Stair training;Functional mobility training;Therapeutic activities;Therapeutic exercise;Patient/family education;Neuromuscular re-education;Balance training;Prosthetic Training;Wheelchair mobility training;Manual techniques;Manual lymph drainage;Compression bandaging;Taping;Energy conservation;Passive range of motion    PT Next Visit Plan  walking with bags    PT Home Exercise Plan  see [id number] on medbridge, spc ambulation    Consulted and Agree with Plan of Care  Patient       Patient will benefit from skilled therapeutic intervention in order to improve the following deficits and impairments:  Abnormal gait, Decreased activity tolerance, Decreased balance, Decreased knowledge of precautions, Decreased endurance, Decreased knowledge of use of DME, Decreased mobility, Decreased range of motion, Difficulty walking,  Decreased strength, Increased edema, Impaired flexibility, Impaired perceived functional ability, Prosthetic Dependency, Postural dysfunction, Improper body mechanics, Pain  Visit Diagnosis: 1. Muscle weakness (generalized)   2. Unsteadiness on feet   3. Other abnormalities of gait and mobility        Problem List Patient Active Problem List   Diagnosis Date Noted  . Pain due to onychomycosis of toenail of right foot 07/05/2018  . Abnormality of gait 10/12/2017  . Poorly controlled type 2 diabetes mellitus with peripheral neuropathy ([place]  . Flatulence   .  Hypomagnesemia   . Unilateral complete BKA, right, sequela ([place])   . Benign essential HTN   . Hypoalbuminemia due to protein-calorie malnutrition ([place])   . S/P below knee amputation, right ([place]) 04/12/2017  . Acute blood loss anemia   . Other encephalopathy 04/08/2017  . Encephalopathy 04/08/2017  . Altered mental status 04/08/2017  . Labile blood pressure   . Labile blood glucose   . Drug induced constipation   . Stage 3 chronic kidney disease ([place])   . Bacteremia   . S/P unilateral BKA (below knee amputation), right ([place]) 04/04/2017  . PAD (peripheral artery disease) ([place])   . Type 2 diabetes mellitus with right diabetic foot ulcer ([place])   . Post-operative pain   . Legally blind   . Upper GI bleed   . Streptococcal bacteremia 04/01/2017  . Hypokalemia 03/30/2017  . Uncontrolled type 2 diabetes mellitus with hyperglycemia, with long-term current use of insulin ([place]) 03/30/2017  . Type 2 diabetes mellitus with peripheral neuropathy ([place]) 03/30/2017  . AKI (acute kidney injury) ([place]) 03/30/2017  . CKD stage 3 due to type 2 diabetes mellitus ([place]) 03/30/2017  . Sepsis ([place]) 03/30/2017  . Heart murmur 11/22/2016  . Hyperlipidemia associated with type 2 diabetes mellitus ([place]) 11/22/2016  . Obstructive sleep apnea syndrome 11/22/2016  . Essential hypertension 12/17/2012  Janna Arch, PT, DPT    07/25/2018, 3:15  PM  [place] MAIN [facility] SERVICES [address] [place], [place], [address] Phone: [phone]   Fax:  [phone]  Name: Trevor [name] MRN: [id number] Date of Birth: 02/26/1955

## 2018-07-30 ENCOUNTER — Other Ambulatory Visit: Payer: Self-pay

## 2018-07-30 ENCOUNTER — Ambulatory Visit: Payer: Medicare Other

## 2018-07-30 DIAGNOSIS — R2681 Unsteadiness on feet: Secondary | ICD-10-CM

## 2018-07-30 DIAGNOSIS — M6281 Muscle weakness (generalized): Secondary | ICD-10-CM | POA: Diagnosis not present

## 2018-07-30 DIAGNOSIS — R2689 Other abnormalities of gait and mobility: Secondary | ICD-10-CM

## 2018-07-31 NOTE — Therapy (Addendum)
[place] MAIN [facility] SERVICES [address] [place], [place], [address] Phone: [phone]   Fax:  [phone]  Physical Therapy Progress Note   Dates of reporting period  06/26/18   to   07/30/18       Patient Details  Name: Trevor [name] MRN: [id number] Date of Birth: February 23, 1955 Referring Provider (PT): Jamse Arn, DR   Encounter Date: 07/30/2018  PT End of Session - 07/30/18 1131    Visit Number  60    Number of Visits  23    Date for PT Re-Evaluation  08/30/18    Authorization Type  Eval 08/23/17; Last goals 07/30/18    PT Start Time  1430    PT Stop Time  1515    PT Time Calculation (min)  45 min    Equipment Utilized During Treatment  Gait belt   Rt BKA prosthesis   Activity Tolerance  Patient tolerated treatment well;Patient limited by fatigue    Behavior During Therapy  WFL for tasks assessed/performed       Past Medical History:  Diagnosis Date  . Diabetes mellitus without complication ([place])   . Heart murmur   . Hyperlipidemia   . Hypertension   . Sleep apnea     Past Surgical History:  Procedure Laterality Date  . AMPUTATION Right 03/31/2017   Procedure: RIGHT FOOT 1ST AND 2ND RAY AMPUTATION;  Surgeon: Newt Minion, MD;  Location: [place];  Service: Orthopedics;  Laterality: Right;  . AMPUTATION Right 04/01/2017   Procedure: AMPUTATION BELOW KNEE;  Surgeon: Newt Minion, MD;  Location: [place];  Service: Orthopedics;  Laterality: Right;  . COLONOSCOPY WITH PROPOFOL N/A 10/02/2017   Procedure: COLONOSCOPY WITH PROPOFOL;  Surgeon: [name], MD;  Location: [facility] ENDOSCOPY;  Service: Gastroenterology;  Laterality: N/A;  . CORNEAL TRANSPLANT      There were no vitals filed for this visit.  Subjective Assessment - 07/30/18 1444    Subjective  Patient reports no pain, no falls since last session. No questions or concerns.    Pertinent History  63 year old right-handed male with history of diabetes mellitus,  hypertension, legally blind, and CKD presenting for BKA 04/01/17. Patient wears contacts and can see better now.  Patient had two surgeries, one was a couple toes then they decided to go below the knee. 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 Has ambulated with RW in hospital with PT. Patient has had home health therapy and was in inpatient rehab about a month. Patient reports he was mainly sitting down, with occasional walking, last home health therapy in May. Patient reports some standing up but no exercises. Patient lives alone in the house and is scared they might fall. Patient received prosthetic leg Tuesday of last week.    Limitations  Standing;Walking;House hold activities;Other (comment)    How long can you stand comfortably?  10-15 minutes    How long can you walk comfortably?  10-15 minutes    Patient Stated Goals  going up steps and driving car.     Currently in Pain?  No/denies    Pain Location  --         [facility] PT Assessment - 07/30/18 1445      Observation/Other Assessments   Other Surveys   Lower Extremity Functional Scale    Lower Extremity Functional Scale   35  6 Minute Walk- Baseline   6 Minute Walk- Baseline  yes    BP (mmHg)  129/69    HR (bpm)  66    02 Sat (%RA)  98 %    Modified Borg Scale for Dyspnea  0- Nothing at all    Perceived Rate of Exertion (Borg)  6-      6 Minute walk- Post Test   6 Minute Walk Post Test  yes    BP (mmHg)  159/76    HR (bpm)  86    02 Sat (%RA)  100 %    Modified Borg Scale for Dyspnea  6-    Perceived Rate of Exertion (Borg)  12-      6 minute walk test results    Aerobic Endurance Distance Walked  800    Endurance additional comments  569f after 3 min; SPC      Standardized Balance Assessment   Standardized Balance Assessment  Timed Up and Go Test;Five Times Sit to Stand;10 meter walk test    Five times sit to stand comments   24.31 with UE support  during 1st rep only; 13.81 with BUE support;     10 Meter Walk  self-selected: 12.45s = 0.80 m/s; fastest: 8.84s = 1.13 m/s      Timed Up and Go Test   TUG  Normal TUG    Normal TUG (seconds)  12.44         TREATMENT  Ther-ex  Pt completed LEFS: 35/80 (unbilled); 6 MWT: 800' with 2 rest breaks, and ending 30s early due to knee and back pain (500' after 3 min)   10 MWT: self-selected: 12.45s = 0.815m; fastest: 8.48s = 1.1327mboth with SPC  5x STS: 24.31 with UE support on 1st rep only; 13s with BUE support throughout  TUG: 12.44s  Ascend and descend stairs with rails: BUE support with rails, performs reciprocally.    Pt demonstrates good motivation during session today.  After reassessing goals today, he has demonstrated progress towards all of his long term goals.  His 5TSTS improved from 25.4s to 24.3s both with single UE support during the first rep only.  His TUG improved from 16.1s with RW to 12.44s with SPC.  His LEFS improved from 30/80 to 35/80.  His 10MWT self-selected speed improved from .21m31mo .15m/77md fastest speed improved from .70m/s26m1.3m/s.88ms 6MWT improved from 500' and ending after 3 min to completing 800' in 5 min and 30 seconds, requiring 2 rest breaks.  Pt will benefit from continued PT services to address balance strength, and functional mobility in order to improve his overall QoL.       PT Short Term Goals - 07/30/18 1144      PT SHORT TERM GOAL #1   Title  Patient will be independent in home exercise program to improve strength/mobility for better functional independence with ADLs.    Baseline  HEP given, 12/11/17: Performing daily:     Time  2    Period  Weeks    Status  Achieved      PT SHORT TERM GOAL #2   Title  Patient will don/doff prosthesis independently to allow for increased mobility in home.     Baseline  8/14: requires PT to guide/direct through process 8/28: independent    Time  2    Period  Weeks    Status  Achieved      PT  SHORT TERM GOAL #3  Title  Patient will ambulate 10 meters with least assistive device and prosthesis to improve mobility in home.     Baseline  8/14: not walking outside of // bars yet  8/28: 63 seconds with RW and CGA     Time  2    Period  Weeks    Status  Achieved      PT SHORT TERM GOAL #4   Title  Patient will perform STS with single UE support to decrease reliance upon UE's for stability     Baseline  patient requires BUE support 11/5: 1 UR support    Time  2    Period  Weeks    Status  Achieved        PT Long Term Goals - 07/30/18 1144      PT LONG TERM GOAL #1   Title  Patient will ambulate >1085f during 618m with LRAD to demonstrate improved community ambulation and functional mobility    Baseline  11/5: will complete next session; 01/22/18: 400' in 3 minutes but had to stop secondary to pain in knee; 01/22/18: 400' in 3 minutes limited by R knee pain, 02/26/18: 500' in 3 minutes limited by low back pain; 06/07/18: Deferred due to new prosthetic; 07/30/18: 800' with SPC (500' after 3 min)- 2 rest breaks, and ended 30 seconds early due to knee and back pain    Time  12    Period  Weeks    Status  Partially Met    Target Date  08/30/18      PT LONG TERM GOAL #2   Title  Patient (> 7062ears old) will complete five times sit to stand test in < 15 seconds indicating an increased LE strength and improved balance.    Baseline  8/14: 61 seconds 8/28; 42 seconds 9/17: 24 seconds BUE support with LLE 10/10: 17 seconds with excessive BUE support  11/5: 18seconds BUE support; 12/11/17: 12/11/17: 18.5s with BUE support but no AD, 01/22/18: 17.1s BUE support but no AD; 02/26/18: 13.0s with BUE, still unable to perform without UE assistance; 06/07/18: 25.4s with single UE support during first rep only; 07/30/18: 24.3s with single UE support during 1st rep only    Time  12    Period  Weeks    Status  Partially Met    Target Date  08/30/18      PT LONG TERM GOAL #3   Title  Patient will reduce  timed up and go to <11 seconds to reduce fall risk and demonstrate improved transfer/gait ability.    Baseline  8/14: unable to perform 8/28: 1 min 30 seconds 9/17: 38 seconds RW 10/10: 25 seconds with RW 11/5: 20 seconds with cane;, 12/11/17: 17.4s with spc; 01/22/18: 14.8s with spc; 02/26/18: 13.0s with spc; 06/07/18: 16.1s with RW; 07/30/18: 12.44s with SPC    Time  12    Period  Weeks    Status  Partially Met    Target Date  08/30/18      PT LONG TERM GOAL #4   Title  Patient will increase lower extremity functional scale to >60/80 to demonstrate improved functional mobility and increased tolerance with ADLs.     Baseline  814: 29/80 8/28: 20/80 10/10: 20/80 11/5: 22/80; 12/11/17: 18/80; 01/22/18: 27/80; 06/07/18: 30/80; 07/30/18: 35/80    Time  12    Period  Weeks    Status  Partially Met    Target Date  08/30/18      PT LONG  TERM GOAL #5   Title  Patient will perform 10 MWT in >1.0 m/s for improved mobility and safety with negotiating natural environment    Baseline  9/17: .53 m/s 10/10: .63 m/s with RW 11/5: 0.65ms; 12/11/17: 15.33s = 0.65 m/s with spc; 01/22/18: self-selected: 14.4s = 0.69 m/s, fastest: 11.8s = 0.85 m/s; 02/26/18: self-selected: 13.3s = 0.75 m/s, fastest: 10.6s = 0.94 m/s; 06/07/18: self-selected: 14.0s = 0.71 m/s, fastest: 11.7s = 0.85 m/s; 07/30/18: self selected: 12.45s = 0.816m, fastest:8.84s = 1.1375m   Time  12    Period  Weeks    Status  Partially Met    Target Date  08/30/18      PT LONG TERM GOAL #6   Title  --    Baseline  --    Time  --    Period  --    Status  --      PT LONG TERM GOAL #7   Title  --    Baseline  --    Time  --    Period  --    Status  --      PT LONG TERM GOAL #8   Title  --    Baseline  --    Time  --    Period  --    Status  --      PT LONG TERM GOAL  #9   TITLE  --    Baseline  --    Time  --    Period  --    Status  --      PT LONG TERM GOAL  #10   TITLE  --    Baseline  --    Time  --    Period  --    Status  --       PT LONG TERM GOAL  #11   TITLE  --    Baseline  --    Time  --    Period  --    Status  --            Plan - 07/30/18 1218    Clinical Impression Statement  Pt demonstrates good motivation during session today.  After reassessing goals today, he has demonstrated progress towards all of his long term goals.  His 5TSTS improved from 25.4s to 24.3s both with single UE support during the first rep only.  His TUG improved from 16.1s with RW to 12.44s with SPC.  His LEFS improved from 30/80 to 35/80.  His 10MWT self-selected speed improved from .29m41mo .14m/74md fastest speed improved from .74m/s2m1.74m/s.67ms 6MWT improved from 500' and ending after 3 min to completing 800' in 5 min and 30 seconds, requiring 2 rest breaks.  Pt will benefit from continued PT services to address balance strength, and functional mobility in order to improve his overall QoL.    Examination-Participation Restrictions  Interpersonal Relationship    Rehab Potential  Fair    Clinical Impairments Affecting Rehab Potential  (+) previous independence, motivation to return to walking (-) lives alone, hx of diabetes, limited vision     PT Frequency  2x / week    PT Duration  8 weeks    PT Treatment/Interventions  ADLs/Self Care Home Management;Cryotherapy;Electrical Stimulation;Ultrasound;Traction;Moist Heat;DME Instruction;Gait training;Stair training;Functional mobility training;Therapeutic activities;Therapeutic exercise;Patient/family education;Neuromuscular re-education;Balance training;Prosthetic Training;Wheelchair mobility training;Manual techniques;Manual lymph drainage;Compression bandaging;Taping;Energy conservation;Passive range of motion    PT Next Visit Plan  walking with  bags    PT Home Exercise Plan  see [id number] on medbridge, spc ambulation    Consulted and Agree with Plan of Care  Patient       Patient will benefit from skilled therapeutic intervention in order to improve the following  deficits and impairments:  Abnormal gait, Decreased activity tolerance, Decreased balance, Decreased knowledge of precautions, Decreased endurance, Decreased knowledge of use of DME, Decreased mobility, Decreased range of motion, Difficulty walking, Decreased strength, Increased edema, Impaired flexibility, Impaired perceived functional ability, Prosthetic Dependency, Postural dysfunction, Improper body mechanics, Pain  Visit Diagnosis: 1. Muscle weakness (generalized)   2. Unsteadiness on feet   3. Other abnormalities of gait and mobility        Problem List Patient Active Problem List   Diagnosis Date Noted  . Pain due to onychomycosis of toenail of right foot 07/05/2018  . Abnormality of gait 10/12/2017  . Poorly controlled type 2 diabetes mellitus with peripheral neuropathy ([place])   . Flatulence   . Hypomagnesemia   . Unilateral complete BKA, right, sequela ([place])   . Benign essential HTN   . Hypoalbuminemia due to protein-calorie malnutrition ([place])   . S/P below knee amputation, right ([place]) 04/12/2017  . Acute blood loss anemia   . Other encephalopathy 04/08/2017  . Encephalopathy 04/08/2017  . Altered mental status 04/08/2017  . Labile blood pressure   . Labile blood glucose   . Drug induced constipation   . Stage 3 chronic kidney disease ([place])   . Bacteremia   . S/P unilateral BKA (below knee amputation), right ([place]) 04/04/2017  . PAD (peripheral artery disease) ([place])   . Type 2 diabetes mellitus with right diabetic foot ulcer ([place])   . Post-operative pain   . Legally blind   . Upper GI bleed   . Streptococcal bacteremia 04/01/2017  . Hypokalemia 03/30/2017  . Uncontrolled type 2 diabetes mellitus with hyperglycemia, with long-term current use of insulin ([place]) 03/30/2017  . Type 2 diabetes mellitus with peripheral neuropathy ([place]) 03/30/2017  . AKI (acute kidney injury) ([place]) 03/30/2017  . CKD stage 3 due to type 2 diabetes mellitus ([place]) 03/30/2017  . Sepsis ([place])  03/30/2017  . Heart murmur 11/22/2016  . Hyperlipidemia associated with type 2 diabetes mellitus ([place]) 11/22/2016  . Obstructive sleep apnea syndrome 11/22/2016  . Essential hypertension 12/17/2012    This entire session was performed under direct supervision and direction of a licensed therapist/therapist assistant . I have personally read, edited and approve of the note as written.    Lutricia Horsfall, SPT [facility] PT, DPT, GCS  Huprich,Jason 07/31/2018, 2:58 PM  [place] MAIN [facility] SERVICES [address] [place], [place], [address] Phone: [phone]   Fax:  [phone]  Name: Trevor [name] MRN: [id number] Date of Birth: 1955-05-22

## 2018-08-01 ENCOUNTER — Ambulatory Visit: Payer: Medicare Other

## 2018-08-01 ENCOUNTER — Other Ambulatory Visit: Payer: Self-pay

## 2018-08-01 DIAGNOSIS — M6281 Muscle weakness (generalized): Secondary | ICD-10-CM | POA: Diagnosis not present

## 2018-08-01 DIAGNOSIS — R2681 Unsteadiness on feet: Secondary | ICD-10-CM

## 2018-08-01 DIAGNOSIS — R2689 Other abnormalities of gait and mobility: Secondary | ICD-10-CM

## 2018-08-01 NOTE — Therapy (Signed)
[place] MAIN [facility] SERVICES [address] [place], [place], [address] Phone: [phone]   Fax:  [phone]  Physical Therapy Treatment  Patient Details  Name: Trevor [name] MRN: [id number] Date of Birth: [birth year] Referring Provider (PT): Jamse Arn, DR   Encounter Date: 08/01/2018  PT End of Session - 08/01/18 1554    Visit Number  61    Number of Visits  53    Date for PT Re-Evaluation  08/30/18    Authorization Type  Eval 08/23/17; Last goals 07/30/18    PT Start Time  1515    PT Stop Time  1559    PT Time Calculation (min)  44 min    Equipment Utilized During Treatment  Gait belt   Rt BKA prosthesis   Activity Tolerance  Patient tolerated treatment well;Patient limited by fatigue    Behavior During Therapy  WFL for tasks assessed/performed       Past Medical History:  Diagnosis Date  . Diabetes mellitus without complication ([place])   . Heart murmur   . Hyperlipidemia   . Hypertension   . Sleep apnea     Past Surgical History:  Procedure Laterality Date  . AMPUTATION Right 03/31/2017   Procedure: RIGHT FOOT 1ST AND 2ND RAY AMPUTATION;  Surgeon: Newt Minion, MD;  Location: [facility];  Service: Orthopedics;  Laterality: Right;  . AMPUTATION Right 04/01/2017   Procedure: AMPUTATION BELOW KNEE;  Surgeon: Newt Minion, MD;  Location: [place];  Service: Orthopedics;  Laterality: Right;  . COLONOSCOPY WITH PROPOFOL N/A 10/02/2017   Procedure: COLONOSCOPY WITH PROPOFOL;  Surgeon: [name], MD;  Location: [facility] ENDOSCOPY;  Service: Gastroenterology;  Laterality: N/A;  . CORNEAL TRANSPLANT      There were no vitals filed for this visit.  Subjective Assessment - 08/01/18 1521    Subjective  Patient reports no falls since last session. No concerns at this time. Brought a bag to practice walking with a bag without losing balance.    Pertinent History  63 year old right-handed male with history of diabetes mellitus,  hypertension, legally blind, and CKD presenting for BKA 04/01/17. Patient wears contacts and can see better now.  Patient had two surgeries, one was a couple toes then they decided to go below the knee. 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 Has ambulated with RW in hospital with PT. Patient has had home health therapy and was in inpatient rehab about a month. Patient reports he was mainly sitting down, with occasional walking, last home health therapy in May. Patient reports some standing up but no exercises. Patient lives alone in the house and is scared they might fall. Patient received prosthetic leg Tuesday of last week.    Limitations  Standing;Walking;House hold activities;Other (comment)    How long can you stand comfortably?  10-15 minutes    How long can you walk comfortably?  10-15 minutes    Patient Stated Goals  going up steps and driving car.     Currently in Pain?  No/denies        Vitals:  133/64 pulse 62 sp02 98 %   Briefcase/bag carry;  Lap around gym: cueing for abdominal activation and arm swing for neutral body mechanics.    Empty case   3lb in case    5lb in case   8lb in case.   Practice opening door  holding briefcase in R hand, switching cane from L hand to R hand . Cueing for foot placement for optimal stability, abdominal activation to decrease loss of balance.   Empty case  3lb in case   5lb in case  8lb in case.     Quantum leg press BLE 210 x10 2 sets  cueing for keeping slight bent in knee with decreasing velocity for increased muscle recruitment.    Quantum leg press single leg (RLE) 105# x 15, 2 sets ; cueing for keeping slight bent in knee with decreasing velocity for increased muscle recruitment.      Pt educated throughout session about proper posture and technique with exercises. Improved exercise technique, movement at target joints, use of target muscles after min to mod  verbal, visual, tactile cues.                 PT Education - 08/01/18 1550    Education Details  exercise technique, stability, negotiation of room with bag/door with a bag    Person(s) Educated  Patient    Methods  Explanation;Demonstration;Tactile cues;Verbal cues    Comprehension  Verbalized understanding;Returned demonstration;Verbal cues required;Tactile cues required       PT Short Term Goals - 07/30/18 1144      PT SHORT TERM GOAL #1   Title  Patient will be independent in home exercise program to improve strength/mobility for better functional independence with ADLs.    Baseline  HEP given, 12/11/17: Performing daily:     Time  2    Period  Weeks    Status  Achieved      PT SHORT TERM GOAL #2   Title  Patient will don/doff prosthesis independently to allow for increased mobility in home.     Baseline  8/14: requires PT to guide/direct through process 8/28: independent    Time  2    Period  Weeks    Status  Achieved      PT SHORT TERM GOAL #3   Title  Patient will ambulate 10 meters with least assistive device and prosthesis to improve mobility in home.     Baseline  8/14: not walking outside of // bars yet  8/28: 63 seconds with RW and CGA     Time  2    Period  Weeks    Status  Achieved      PT SHORT TERM GOAL #4   Title  Patient will perform STS with single UE support to decrease reliance upon UE's for stability     Baseline  patient requires BUE support 11/5: 1 UR support    Time  2    Period  Weeks    Status  Achieved        PT Long Term Goals - 07/30/18 1144      PT LONG TERM GOAL #1   Title  Patient will ambulate >1018f during 656m with LRAD to demonstrate improved community ambulation and functional mobility    Baseline  11/5: will complete next session; 01/22/18: 400' in 3 minutes but had to stop secondary to pain in knee; 01/22/18: 400' in 3 minutes limited by R knee pain, 02/26/18: 500' in 3 minutes limited by low back pain; 06/07/18:  Deferred due to new prosthetic; 07/30/18: 800' with SPC (500' after 3 min)- 2 rest breaks, and ended 30 seconds early due to knee and back pain    Time  12    Period  Weeks    Status  Partially Met    Target Date  08/30/18      PT LONG TERM GOAL #2   Title  Patient (> 38 years old) will complete five times sit to stand test in < 15 seconds indicating an increased LE strength and improved balance.    Baseline  8/14: 61 seconds 8/28; 42 seconds 9/17: 24 seconds BUE support with LLE 10/10: 17 seconds with excessive BUE support  11/5: 18seconds BUE support; 12/11/17: 12/11/17: 18.5s with BUE support but no AD, 01/22/18: 17.1s BUE support but no AD; 02/26/18: 13.0s with BUE, still unable to perform without UE assistance; 06/07/18: 25.4s with single UE support during first rep only; 07/30/18: 24.3s with single UE support during 1st rep only    Time  12    Period  Weeks    Status  Partially Met    Target Date  08/30/18      PT LONG TERM GOAL #3   Title  Patient will reduce timed up and go to <11 seconds to reduce fall risk and demonstrate improved transfer/gait ability.    Baseline  8/14: unable to perform 8/28: 1 min 30 seconds 9/17: 38 seconds RW 10/10: 25 seconds with RW 11/5: 20 seconds with cane;, 12/11/17: 17.4s with spc; 01/22/18: 14.8s with spc; 02/26/18: 13.0s with spc; 06/07/18: 16.1s with RW; 07/30/18: 12.44s with SPC    Time  12    Period  Weeks    Status  Partially Met    Target Date  08/30/18      PT LONG TERM GOAL #4   Title  Patient will increase lower extremity functional scale to >60/80 to demonstrate improved functional mobility and increased tolerance with ADLs.     Baseline  814: 29/80 8/28: 20/80 10/10: 20/80 11/5: 22/80; 12/11/17: 18/80; 01/22/18: 27/80; 06/07/18: 30/80; 07/30/18: 35/80    Time  12    Period  Weeks    Status  Partially Met    Target Date  08/30/18      PT LONG TERM GOAL #5   Title  Patient will perform 10 MWT in >1.0 m/s for improved mobility and safety with  negotiating natural environment    Baseline  9/17: .53 m/s 10/10: .63 m/s with RW 11/5: 0.52ms; 12/11/17: 15.33s = 0.65 m/s with spc; 01/22/18: self-selected: 14.4s = 0.69 m/s, fastest: 11.8s = 0.85 m/s; 02/26/18: self-selected: 13.3s = 0.75 m/s, fastest: 10.6s = 0.94 m/s; 06/07/18: self-selected: 14.0s = 0.71 m/s, fastest: 11.7s = 0.85 m/s; 07/30/18: self selected: 12.45s = 0.882m, fastest:8.84s = 1.1391m   Time  12    Period  Weeks    Status  Partially Met    Target Date  08/30/18      PT LONG TERM GOAL #6   Title  --    Baseline  --    Time  --    Period  --    Status  --      PT LONG TERM GOAL #7   Title  --    Baseline  --    Time  --    Period  --    Status  --      PT LONG TERM GOAL #8   Title  --    Baseline  --    Time  --    Period  --    Status  --      PT LONG TERM GOAL  #9   TITLE  --    Baseline  --  Time  --    Period  --    Status  --      PT LONG TERM GOAL  #10   TITLE  --    Baseline  --    Time  --    Period  --    Status  --      PT LONG TERM GOAL  #11   TITLE  --    Baseline  --    Time  --    Period  --    Status  --            Plan - 08/01/18 1555    Clinical Impression Statement  Patient educated and performed room negotiation ambulating with SPC and weighted bag with increasing weights for carryover at home. Due to patient's limited stability when opening door with bag at home patient performed task with PT guidance for foot placement, abdominal activation, and postural correction. He will continue to benefit from skilled physical therapy to improve functional mobility, improve prosthetic use and increase ambulation to increase LOF and improve overall QOL.    Examination-Participation Restrictions  Interpersonal Relationship    Rehab Potential  Fair    Clinical Impairments Affecting Rehab Potential  (+) previous independence, motivation to return to walking (-) lives alone, hx of diabetes, limited vision     PT Frequency  2x / week     PT Duration  8 weeks    PT Treatment/Interventions  ADLs/Self Care Home Management;Cryotherapy;Electrical Stimulation;Ultrasound;Traction;Moist Heat;DME Instruction;Gait training;Stair training;Functional mobility training;Therapeutic activities;Therapeutic exercise;Patient/family education;Neuromuscular re-education;Balance training;Prosthetic Training;Wheelchair mobility training;Manual techniques;Manual lymph drainage;Compression bandaging;Taping;Energy conservation;Passive range of motion    PT Next Visit Plan  walking with bags    PT Home Exercise Plan  see [id number] on medbridge, spc ambulation    Consulted and Agree with Plan of Care  Patient       Patient will benefit from skilled therapeutic intervention in order to improve the following deficits and impairments:  Abnormal gait, Decreased activity tolerance, Decreased balance, Decreased knowledge of precautions, Decreased endurance, Decreased knowledge of use of DME, Decreased mobility, Decreased range of motion, Difficulty walking, Decreased strength, Increased edema, Impaired flexibility, Impaired perceived functional ability, Prosthetic Dependency, Postural dysfunction, Improper body mechanics, Pain  Visit Diagnosis: 1. Muscle weakness (generalized)   2. Unsteadiness on feet   3. Other abnormalities of gait and mobility        Problem List Patient Active Problem List   Diagnosis Date Noted  . Pain due to onychomycosis of toenail of right foot 07/05/2018  . Abnormality of gait 10/12/2017  . Poorly controlled type 2 diabetes mellitus with peripheral neuropathy ([place])   . Flatulence   . Hypomagnesemia   . Unilateral complete BKA, right, sequela ([place])   . Benign essential HTN   . Hypoalbuminemia due to protein-calorie malnutrition ([place])   . S/P below knee amputation, right ([place]) 04/12/2017  . Acute blood loss anemia   . Other encephalopathy 04/08/2017  . Encephalopathy 04/08/2017  . Altered mental status 04/08/2017  .  Labile blood pressure   . Labile blood glucose   . Drug induced constipation   . Stage 3 chronic kidney disease ([place])   . Bacteremia   . S/P unilateral BKA (below knee amputation), right ([place]) 04/04/2017  . PAD (peripheral artery disease) ([place])   . Type 2 diabetes mellitus with right diabetic foot ulcer ([place])   . Post-operative pain   . Legally blind   . Upper GI bleed   .  Streptococcal bacteremia 04/01/2017  . Hypokalemia 03/30/2017  . Uncontrolled type 2 diabetes mellitus with hyperglycemia, with long-term current use of insulin ([place]) 03/30/2017  . Type 2 diabetes mellitus with peripheral neuropathy ([place]) 03/30/2017  . AKI (acute kidney injury) ([place]) 03/30/2017  . CKD stage 3 due to type 2 diabetes mellitus ([place]) 03/30/2017  . Sepsis ([place]) 03/30/2017  . Heart murmur 11/22/2016  . Hyperlipidemia associated with type 2 diabetes mellitus ([place]) 11/22/2016  . Obstructive sleep apnea syndrome 11/22/2016  . Essential hypertension 12/17/2012   Janna Arch, PT, DPT   08/01/2018, 3:59 PM  [place] MAIN [facility] SERVICES [address] [place], [place], [address] Phone: [phone]   Fax:  [phone]  Name: Trevor [name] MRN: [id number] Date of Birth: 1955/07/17

## 2018-08-07 ENCOUNTER — Ambulatory Visit: Payer: Medicare Other

## 2018-08-09 ENCOUNTER — Ambulatory Visit: Payer: Medicare Other

## 2018-08-09 ENCOUNTER — Other Ambulatory Visit: Payer: Self-pay

## 2018-08-09 VITALS — BP 132/67 | HR 63

## 2018-08-09 DIAGNOSIS — R2681 Unsteadiness on feet: Secondary | ICD-10-CM

## 2018-08-09 DIAGNOSIS — R2689 Other abnormalities of gait and mobility: Secondary | ICD-10-CM

## 2018-08-09 DIAGNOSIS — M6281 Muscle weakness (generalized): Secondary | ICD-10-CM

## 2018-08-09 DIAGNOSIS — S88111S Complete traumatic amputation at level between knee and ankle, right lower leg, sequela: Secondary | ICD-10-CM

## 2018-08-10 NOTE — Therapy (Signed)
[place] MAIN [facility] SERVICES [address] [place], [place], [address] Phone: [phone]   Fax:  [phone]  Physical Therapy Treatment  Patient Details  Name: Trevor [name] MRN: [id number] Date of Birth: 18-Dec-1955 Referring Provider (PT): Jamse Arn, DR   Encounter Date: 08/09/2018  PT End of Session - 08/09/18 1548    Visit Number  45    Number of Visits  45    Date for PT Re-Evaluation  08/30/18    Authorization Type  Eval 08/23/17; Last goals 07/30/18    PT Start Time  1515    PT Stop Time  1600    PT Time Calculation (min)  45 min    Equipment Utilized During Treatment  Gait belt   Rt BKA prosthesis   Activity Tolerance  Patient tolerated treatment well;Patient limited by fatigue    Behavior During Therapy  WFL for tasks assessed/performed       Past Medical History:  Diagnosis Date  . Diabetes mellitus without complication ([place])   . Heart murmur   . Hyperlipidemia   . Hypertension   . Sleep apnea     Past Surgical History:  Procedure Laterality Date  . AMPUTATION Right 03/31/2017   Procedure: RIGHT FOOT 1ST AND 2ND RAY AMPUTATION;  Surgeon: Newt Minion, MD;  Location: [place];  Service: Orthopedics;  Laterality: Right;  . AMPUTATION Right 04/01/2017   Procedure: AMPUTATION BELOW KNEE;  Surgeon: Newt Minion, MD;  Location: [place];  Service: Orthopedics;  Laterality: Right;  . COLONOSCOPY WITH PROPOFOL N/A 10/02/2017   Procedure: COLONOSCOPY WITH PROPOFOL;  Surgeon: [name], MD;  Location: [facility] ENDOSCOPY;  Service: Gastroenterology;  Laterality: N/A;  . CORNEAL TRANSPLANT      Vitals:   08/09/18 1520  BP: 132/67  Pulse: 63  SpO2: 99%    Subjective Assessment - 08/09/18 1521    Subjective  Patient reports no falls since last session. No pain or soreness after last session.  No concerns at this time.    Pertinent History  63 year old right-handed male with history of diabetes mellitus, hypertension,  legally blind, and CKD presenting for BKA 04/01/17. Patient wears contacts and can see better now.  Patient had two surgeries, one was a couple toes then they decided to go below the knee. 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 Has ambulated with RW in hospital with PT. Patient has had home health therapy and was in inpatient rehab about a month. Patient reports he was mainly sitting down, with occasional walking, last home health therapy in May. Patient reports some standing up but no exercises. Patient lives alone in the house and is scared they might fall. Patient received prosthetic leg Tuesday of last week.    Limitations  Standing;Walking;House hold activities;Other (comment)    How long can you stand comfortably?  10-15 minutes    How long can you walk comfortably?  10-15 minutes    Patient Stated Goals  going up steps and driving car.     Currently in Pain?  No/denies       Vitals: BP 132/67; HR 63 bpm; SpO2 99%   Walking with plank in between legs in // bars x3 forward and backward   Walking with red mat and plank down middle in // bars 2x5 forward and backward; with decreasing UE support    Walking on red  mat with horizontal head turns 2x5 forward and backward    airex pad: 6" step toe taps no UE support 1x20 each LE, CGA due to occasional LOB requiring assistance to retain COM  (R knee pain 4/10 after intervention)   Seated hamstring stretch bilaterally    Seated hip abduction with green band x10 with 5 sec hold   Seated hip adduction with ball x10 with 5 sec hold   Pt educated throughout session about proper posture and technique with exercises. Improved exercise technique, movement at target joints, use of target muscles after min to mod verbal, visual, tactile cues.      Pt displays good motivation throughout today's session.  He displays difficulty with retro walking on uneven surfaces, requiring  intermittent UE support and CGA for steadying throughout.  He demonstrates the ability to decrease UE support while walking on an uneven surface throughout intervention, with a decline in technique noted with fatigue.  Overall, he demonstrates decreased foot clearance of the prosthetic limb.  Increased cueing provided during airex toe taps.  He demonstrated fatigue and endorsed R knee pain (4/10) towards end of session, noting no increase in pain with seated exercises.  CGA provided throughout in // bars with faded use of UE throughout interventions.  Pt will benefit from continued PT services to address balance strength, and functional mobility in order to improve his overall QoL.       PT Education - 08/10/18 1045    Education Details  exercise technique, stability, body mechanics    Person(s) Educated  Patient    Methods  Explanation;Demonstration;Tactile cues;Verbal cues    Comprehension  Verbalized understanding;Returned demonstration       PT Short Term Goals - 07/30/18 1144      PT SHORT TERM GOAL #1   Title  Patient will be independent in home exercise program to improve strength/mobility for better functional independence with ADLs.    Baseline  HEP given, 12/11/17: Performing daily:     Time  2    Period  Weeks    Status  Achieved      PT SHORT TERM GOAL #2   Title  Patient will don/doff prosthesis independently to allow for increased mobility in home.     Baseline  8/14: requires PT to guide/direct through process 8/28: independent    Time  2    Period  Weeks    Status  Achieved      PT SHORT TERM GOAL #3   Title  Patient will ambulate 10 meters with least assistive device and prosthesis to improve mobility in home.     Baseline  8/14: not walking outside of // bars yet  8/28: 63 seconds with RW and CGA     Time  2    Period  Weeks    Status  Achieved      PT SHORT TERM GOAL #4   Title  Patient will perform STS with single UE support to decrease reliance upon UE's for  stability     Baseline  patient requires BUE support 11/5: 1 UR support    Time  2    Period  Weeks    Status  Achieved        PT Long Term Goals - 07/30/18 1144      PT LONG TERM GOAL #1   Title  Patient will ambulate >1063f during 642m with LRAD to demonstrate improved community ambulation and functional mobility    Baseline  11/5: will  complete next session; 01/22/18: 400' in 3 minutes but had to stop secondary to pain in knee; 01/22/18: 400' in 3 minutes limited by R knee pain, 02/26/18: 500' in 3 minutes limited by low back pain; 06/07/18: Deferred due to new prosthetic; 07/30/18: 800' with SPC (500' after 3 min)- 2 rest breaks, and ended 30 seconds early due to knee and back pain    Time  12    Period  Weeks    Status  Partially Met    Target Date  08/30/18      PT LONG TERM GOAL #2   Title  Patient (> 68 years old) will complete five times sit to stand test in < 15 seconds indicating an increased LE strength and improved balance.    Baseline  8/14: 61 seconds 8/28; 42 seconds 9/17: 24 seconds BUE support with LLE 10/10: 17 seconds with excessive BUE support  11/5: 18seconds BUE support; 12/11/17: 12/11/17: 18.5s with BUE support but no AD, 01/22/18: 17.1s BUE support but no AD; 02/26/18: 13.0s with BUE, still unable to perform without UE assistance; 06/07/18: 25.4s with single UE support during first rep only; 07/30/18: 24.3s with single UE support during 1st rep only    Time  12    Period  Weeks    Status  Partially Met    Target Date  08/30/18      PT LONG TERM GOAL #3   Title  Patient will reduce timed up and go to <11 seconds to reduce fall risk and demonstrate improved transfer/gait ability.    Baseline  8/14: unable to perform 8/28: 1 min 30 seconds 9/17: 38 seconds RW 10/10: 25 seconds with RW 11/5: 20 seconds with cane;, 12/11/17: 17.4s with spc; 01/22/18: 14.8s with spc; 02/26/18: 13.0s with spc; 06/07/18: 16.1s with RW; 07/30/18: 12.44s with SPC    Time  12    Period  Weeks     Status  Partially Met    Target Date  08/30/18      PT LONG TERM GOAL #4   Title  Patient will increase lower extremity functional scale to >60/80 to demonstrate improved functional mobility and increased tolerance with ADLs.     Baseline  814: 29/80 8/28: 20/80 10/10: 20/80 11/5: 22/80; 12/11/17: 18/80; 01/22/18: 27/80; 06/07/18: 30/80; 07/30/18: 35/80    Time  12    Period  Weeks    Status  Partially Met    Target Date  08/30/18      PT LONG TERM GOAL #5   Title  Patient will perform 10 MWT in >1.0 m/s for improved mobility and safety with negotiating natural environment    Baseline  9/17: .53 m/s 10/10: .63 m/s with RW 11/5: 0.26ms; 12/11/17: 15.33s = 0.65 m/s with spc; 01/22/18: self-selected: 14.4s = 0.69 m/s, fastest: 11.8s = 0.85 m/s; 02/26/18: self-selected: 13.3s = 0.75 m/s, fastest: 10.6s = 0.94 m/s; 06/07/18: self-selected: 14.0s = 0.71 m/s, fastest: 11.7s = 0.85 m/s; 07/30/18: self selected: 12.45s = 0.869m, fastest:8.84s = 1.1356m   Time  12    Period  Weeks    Status  Partially Met    Target Date  08/30/18      PT LONG TERM GOAL #6   Title  --    Baseline  --    Time  --    Period  --    Status  --      PT LONG TERM GOAL #7   Title  --  Baseline  --    Time  --    Period  --    Status  --      PT LONG TERM GOAL #8   Title  --    Baseline  --    Time  --    Period  --    Status  --      PT LONG TERM GOAL  #9   TITLE  --    Baseline  --    Time  --    Period  --    Status  --      PT LONG TERM GOAL  #10   TITLE  --    Baseline  --    Time  --    Period  --    Status  --      PT LONG TERM GOAL  #11   TITLE  --    Baseline  --    Time  --    Period  --    Status  --            Plan - 08/10/18 1044    Clinical Impression Statement  Pt displays good motivation throughout today's session.  He displays difficulty with retro walking on uneven surfaces, requiring intermittent UE support and CGA for steadying throughout.  He demonstrates the ability  to decrease UE support while walking on an uneven surface throughout intervention, with a decline in technique noted with fatigue.  Overall, he demonstrates decreased foot clearance of the prosthetic limb.  Increased cueing provided during airex toe taps.  He demonstrated fatigue and endorsed R knee pain (4/10) towards end of session, noting no increase in pain with seated exercises.  CGA provided throughout in // bars with faded use of UE throughout interventions.  Pt will benefit from continued PT services to address balance strength, and functional mobility in order to improve his overall QoL.    Examination-Participation Restrictions  Interpersonal Relationship    Rehab Potential  Fair    Clinical Impairments Affecting Rehab Potential  (+) previous independence, motivation to return to walking (-) lives alone, hx of diabetes, limited vision     PT Frequency  2x / week    PT Duration  8 weeks    PT Treatment/Interventions  ADLs/Self Care Home Management;Cryotherapy;Electrical Stimulation;Ultrasound;Traction;Moist Heat;DME Instruction;Gait training;Stair training;Functional mobility training;Therapeutic activities;Therapeutic exercise;Patient/family education;Neuromuscular re-education;Balance training;Prosthetic Training;Wheelchair mobility training;Manual techniques;Manual lymph drainage;Compression bandaging;Taping;Energy conservation;Passive range of motion    PT Next Visit Plan  walking with bags    PT Home Exercise Plan  see [id number] on medbridge, spc ambulation    Consulted and Agree with Plan of Care  Patient       Patient will benefit from skilled therapeutic intervention in order to improve the following deficits and impairments:  Abnormal gait, Decreased activity tolerance, Decreased balance, Decreased knowledge of precautions, Decreased endurance, Decreased knowledge of use of DME, Decreased mobility, Decreased range of motion, Difficulty walking, Decreased strength, Increased edema,  Impaired flexibility, Impaired perceived functional ability, Prosthetic Dependency, Postural dysfunction, Improper body mechanics, Pain  Visit Diagnosis: 1. Muscle weakness (generalized)   2. Unsteadiness on feet   3. Other abnormalities of gait and mobility   4. Unilateral complete BKA, right, sequela [facility])        Problem List Patient Active Problem List   Diagnosis Date Noted  . Pain due to onychomycosis of toenail of right foot 07/05/2018  . Abnormality of gait 10/12/2017  . Poorly controlled type  2 diabetes mellitus with peripheral neuropathy ([place])   . Flatulence   . Hypomagnesemia   . Unilateral complete BKA, right, sequela ([place])   . Benign essential HTN   . Hypoalbuminemia due to protein-calorie malnutrition ([place])   . S/P below knee amputation, right ([place]) 04/12/2017  . Acute blood loss anemia   . Other encephalopathy 04/08/2017  . Encephalopathy 04/08/2017  . Altered mental status 04/08/2017  . Labile blood pressure   . Labile blood glucose   . Drug induced constipation   . Stage 3 chronic kidney disease ([place])   . Bacteremia   . S/P unilateral BKA (below knee amputation), right ([place]) 04/04/2017  . PAD (peripheral artery disease) ([place])   . Type 2 diabetes mellitus with right diabetic foot ulcer ([place])   . Post-operative pain   . Legally blind   . Upper GI bleed   . Streptococcal bacteremia 04/01/2017  . Hypokalemia 03/30/2017  . Uncontrolled type 2 diabetes mellitus with hyperglycemia, with long-term current use of insulin ([place]) 03/30/2017  . Type 2 diabetes mellitus with peripheral neuropathy ([place]) 03/30/2017  . AKI (acute kidney injury) ([place]) 03/30/2017  . CKD stage 3 due to type 2 diabetes mellitus ([place]) 03/30/2017  . Sepsis ([place]) 03/30/2017  . Heart murmur 11/22/2016  . Hyperlipidemia associated with type 2 diabetes mellitus ([place]) 11/22/2016  . Obstructive sleep apnea syndrome 11/22/2016  . Essential hypertension 12/17/2012    Lutricia Horsfall, SPT  This  entire session was performed under direct supervision and direction of a licensed therapist/therapist assistant . I have personally read, edited and approve of the note as written.  Janna Arch, PT, DPT   08/10/2018, 12:15 PM  [place] MAIN [facility] SERVICES [address] [place], [place], [address] Phone: [phone]   Fax:  [phone]  Name: Trevor [name] MRN: [id number] Date of Birth: Apr 08, 1955

## 2018-08-14 ENCOUNTER — Other Ambulatory Visit: Payer: Self-pay

## 2018-08-14 ENCOUNTER — Ambulatory Visit: Payer: Medicare Other | Attending: Physical Medicine & Rehabilitation

## 2018-08-14 DIAGNOSIS — S88111S Complete traumatic amputation at level between knee and ankle, right lower leg, sequela: Secondary | ICD-10-CM | POA: Diagnosis present

## 2018-08-14 DIAGNOSIS — R2689 Other abnormalities of gait and mobility: Secondary | ICD-10-CM | POA: Insufficient documentation

## 2018-08-14 DIAGNOSIS — R2681 Unsteadiness on feet: Secondary | ICD-10-CM | POA: Diagnosis present

## 2018-08-14 DIAGNOSIS — M6281 Muscle weakness (generalized): Secondary | ICD-10-CM | POA: Insufficient documentation

## 2018-08-14 NOTE — Therapy (Signed)
[place] MAIN [facility] SERVICES [address] [place], [place], [address] Phone: [phone]   Fax:  [phone]  Physical Therapy Treatment  Patient Details  Name: Trevor [name] MRN: [id number] Date of Birth: February 11, 1955 Referring Provider (PT): Jamse Arn, DR   Encounter Date: 08/14/2018  PT End of Session - 08/14/18 1521    Visit Number  63    Number of Visits  73    Date for PT Re-Evaluation  08/30/18    Authorization Type  Eval 08/23/17; Last goals 07/30/18    PT Start Time  1518    PT Stop Time  1600    PT Time Calculation (min)  42 min    Equipment Utilized During Treatment  Gait belt   Rt BKA prosthesis   Activity Tolerance  Patient tolerated treatment well;Patient limited by fatigue    Behavior During Therapy  WFL for tasks assessed/performed       Past Medical History:  Diagnosis Date  . Diabetes mellitus without complication ([place])   . Heart murmur   . Hyperlipidemia   . Hypertension   . Sleep apnea     Past Surgical History:  Procedure Laterality Date  . AMPUTATION Right 03/31/2017   Procedure: RIGHT FOOT 1ST AND 2ND RAY AMPUTATION;  Surgeon: Newt Minion, MD;  Location: [facility];  Service: Orthopedics;  Laterality: Right;  . AMPUTATION Right 04/01/2017   Procedure: AMPUTATION BELOW KNEE;  Surgeon: Newt Minion, MD;  Location: [facility];  Service: Orthopedics;  Laterality: Right;  . COLONOSCOPY WITH PROPOFOL N/A 10/02/2017   Procedure: COLONOSCOPY WITH PROPOFOL;  Surgeon: [name], MD;  Location: [facility] ENDOSCOPY;  Service: Gastroenterology;  Laterality: N/A;  . CORNEAL TRANSPLANT      There were no vitals filed for this visit.  Subjective Assessment - 08/14/18 1521    Subjective  Patient reports that he is doing well today. No pain or soreness upon arrival. No specific questions or concerns at this time.    Pertinent History  63 year old right-handed male with history of diabetes mellitus, hypertension, legally  blind, and CKD presenting for BKA 04/01/17. Patient wears contacts and can see better now.  Patient had two surgeries, one was a couple toes then they decided to go below the knee. 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 Has ambulated with RW in hospital with PT. Patient has had home health therapy and was in inpatient rehab about a month. Patient reports he was mainly sitting down, with occasional walking, last home health therapy in May. Patient reports some standing up but no exercises. Patient lives alone in the house and is scared they might fall. Patient received prosthetic leg Tuesday of last week.    Limitations  Standing;Walking;House hold activities;Other (comment)    How long can you stand comfortably?  10-15 minutes    How long can you walk comfortably?  10-15 minutes    Patient Stated Goals  going up steps and driving car.     Currently in Pain?  No/denies         TREATMENT   Therapeutic Exercise: Sit to stand without UE support from low blue mat table with LLE on 5" step 2 x 5; Seated clams with manual resistance x 10; Seated adductor squeeze with manual resistance x 10; Seated HS stretch 30s hold x 2 bilateral; Airex pad 6" step toe taps no  UE support alternating LE x 20 each LE, CGA due to occasional LOB; Attempted standing hip flexor stretch however pt struggles to perform correction and reports some pain in his R residual limb so discontinued.    Gait Training Gait trainingin [id number]' withsingle point cane progressing to no assistive device for the large majority of the distance. Performed horizontal/vertical head turns, gait speed changes, and quick stops. One seatedrest break required.He is able to increase his distance considerably today. However at the end of ambulation he reports 3/10 R residual limb and 4/10 low back pain.   Pt educated throughout session about proper posture and  technique with exercises. Improved exercise technique, movement at target joints, use of target muscles after min to mod verbal, visual, tactile cues.   Pt displays good motivation throughout today's session. He is able to complete 2 large laps around the downstairs of the hospital today and most of the distance he completed without support from his single point cane. He does require one seated rest break due to fatigue and reports some increase in R residual limb and low back pain at the end. He demonstrates improving single leg stability on foam surface during toe taps. Attempted standing hip flexor stretch however pt struggles to perform correction and reports some pain in his R residual limb so discontinued. Pt encouraged to continue his current HEP and follow-up as scheduled. Pt will benefit from continued PT services to address balance strength, and functional mobility in order to improve his overall QoL.                          PT Short Term Goals - 07/30/18 1144      PT SHORT TERM GOAL #1   Title  Patient will be independent in home exercise program to improve strength/mobility for better functional independence with ADLs.    Baseline  HEP given, 12/11/17: Performing daily:     Time  2    Period  Weeks    Status  Achieved      PT SHORT TERM GOAL #2   Title  Patient will don/doff prosthesis independently to allow for increased mobility in home.     Baseline  8/14: requires PT to guide/direct through process 8/28: independent    Time  2    Period  Weeks    Status  Achieved      PT SHORT TERM GOAL #3   Title  Patient will ambulate 10 meters with least assistive device and prosthesis to improve mobility in home.     Baseline  8/14: not walking outside of // bars yet  8/28: 63 seconds with RW and CGA     Time  2    Period  Weeks    Status  Achieved      PT SHORT TERM GOAL #4   Title  Patient will perform STS with single UE support to decrease reliance upon  UE's for stability     Baseline  patient requires BUE support 11/5: 1 UR support    Time  2    Period  Weeks    Status  Achieved        PT Long Term Goals - 07/30/18 1144      PT LONG TERM GOAL #1   Title  Patient will ambulate >1079f during 617m with LRAD to demonstrate improved community ambulation and functional mobility    Baseline  11/5: will complete next session; 01/22/18: 400' in  3 minutes but had to stop secondary to pain in knee; 01/22/18: 400' in 3 minutes limited by R knee pain, 02/26/18: 500' in 3 minutes limited by low back pain; 06/07/18: Deferred due to new prosthetic; 07/30/18: 800' with SPC (500' after 3 min)- 2 rest breaks, and ended 30 seconds early due to knee and back pain    Time  12    Period  Weeks    Status  Partially Met    Target Date  08/30/18      PT LONG TERM GOAL #2   Title  Patient (> 28 years old) will complete five times sit to stand test in < 15 seconds indicating an increased LE strength and improved balance.    Baseline  8/14: 61 seconds 8/28; 42 seconds 9/17: 24 seconds BUE support with LLE 10/10: 17 seconds with excessive BUE support  11/5: 18seconds BUE support; 12/11/17: 12/11/17: 18.5s with BUE support but no AD, 01/22/18: 17.1s BUE support but no AD; 02/26/18: 13.0s with BUE, still unable to perform without UE assistance; 06/07/18: 25.4s with single UE support during first rep only; 07/30/18: 24.3s with single UE support during 1st rep only    Time  12    Period  Weeks    Status  Partially Met    Target Date  08/30/18      PT LONG TERM GOAL #3   Title  Patient will reduce timed up and go to <11 seconds to reduce fall risk and demonstrate improved transfer/gait ability.    Baseline  8/14: unable to perform 8/28: 1 min 30 seconds 9/17: 38 seconds RW 10/10: 25 seconds with RW 11/5: 20 seconds with cane;, 12/11/17: 17.4s with spc; 01/22/18: 14.8s with spc; 02/26/18: 13.0s with spc; 06/07/18: 16.1s with RW; 07/30/18: 12.44s with SPC    Time  12    Period  Weeks     Status  Partially Met    Target Date  08/30/18      PT LONG TERM GOAL #4   Title  Patient will increase lower extremity functional scale to >60/80 to demonstrate improved functional mobility and increased tolerance with ADLs.     Baseline  814: 29/80 8/28: 20/80 10/10: 20/80 11/5: 22/80; 12/11/17: 18/80; 01/22/18: 27/80; 06/07/18: 30/80; 07/30/18: 35/80    Time  12    Period  Weeks    Status  Partially Met    Target Date  08/30/18      PT LONG TERM GOAL #5   Title  Patient will perform 10 MWT in >1.0 m/s for improved mobility and safety with negotiating natural environment    Baseline  9/17: .53 m/s 10/10: .63 m/s with RW 11/5: 0.69ms; 12/11/17: 15.33s = 0.65 m/s with spc; 01/22/18: self-selected: 14.4s = 0.69 m/s, fastest: 11.8s = 0.85 m/s; 02/26/18: self-selected: 13.3s = 0.75 m/s, fastest: 10.6s = 0.94 m/s; 06/07/18: self-selected: 14.0s = 0.71 m/s, fastest: 11.7s = 0.85 m/s; 07/30/18: self selected: 12.45s = 0.825m, fastest:8.84s = 1.1379m   Time  12    Period  Weeks    Status  Partially Met    Target Date  08/30/18      PT LONG TERM GOAL #6   Title  --    Baseline  --    Time  --    Period  --    Status  --      PT LONG TERM GOAL #7   Title  --    Baseline  --  Time  --    Period  --    Status  --      PT LONG TERM GOAL #8   Title  --    Baseline  --    Time  --    Period  --    Status  --      PT LONG TERM GOAL  #9   TITLE  --    Baseline  --    Time  --    Period  --    Status  --      PT LONG TERM GOAL  #10   TITLE  --    Baseline  --    Time  --    Period  --    Status  --      PT LONG TERM GOAL  #11   TITLE  --    Baseline  --    Time  --    Period  --    Status  --            Plan - 08/14/18 1521    Clinical Impression Statement  Pt displays good motivation throughout today's session. He is able to complete 2 large laps around the downstairs of the hospital today and most of the distance he completed without support from his single point  cane. He does require one seated rest break due to fatigue and reports some increase in R residual limb and low back pain at the end. He demonstrates improving single leg stability on foam surface during toe taps. Attempted standing hip flexor stretch however pt struggles to perform correction and reports some pain in his R residual limb so discontinued. Pt encouraged to continue his current HEP and follow-up as scheduled. Pt will benefit from continued PT services to address balance strength, and functional mobility in order to improve his overall QoL.    Examination-Participation Restrictions  Interpersonal Relationship    Rehab Potential  Fair    Clinical Impairments Affecting Rehab Potential  (+) previous independence, motivation to return to walking (-) lives alone, hx of diabetes, limited vision     PT Frequency  2x / week    PT Duration  8 weeks    PT Treatment/Interventions  ADLs/Self Care Home Management;Cryotherapy;Electrical Stimulation;Ultrasound;Traction;Moist Heat;DME Instruction;Gait training;Stair training;Functional mobility training;Therapeutic activities;Therapeutic exercise;Patient/family education;Neuromuscular re-education;Balance training;Prosthetic Training;Wheelchair mobility training;Manual techniques;Manual lymph drainage;Compression bandaging;Taping;Energy conservation;Passive range of motion    PT Next Visit Plan  walking with bags    PT Home Exercise Plan  see [id number] on medbridge, spc ambulation    Consulted and Agree with Plan of Care  Patient       Patient will benefit from skilled therapeutic intervention in order to improve the following deficits and impairments:  Abnormal gait, Decreased activity tolerance, Decreased balance, Decreased knowledge of precautions, Decreased endurance, Decreased knowledge of use of DME, Decreased mobility, Decreased range of motion, Difficulty walking, Decreased strength, Increased edema, Impaired flexibility, Impaired perceived  functional ability, Prosthetic Dependency, Postural dysfunction, Improper body mechanics, Pain  Visit Diagnosis: 1. Muscle weakness (generalized)   2. Unsteadiness on feet   3. Other abnormalities of gait and mobility        Problem List Patient Active Problem List   Diagnosis Date Noted  . Pain due to onychomycosis of toenail of right foot 07/05/2018  . Abnormality of gait 10/12/2017  . Poorly controlled type 2 diabetes mellitus with peripheral neuropathy ([place])   . Flatulence   . Hypomagnesemia   .  Unilateral complete BKA, right, sequela ([place])   . Benign essential HTN   . Hypoalbuminemia due to protein-calorie malnutrition ([place])   . S/P below knee amputation, right ([place]) 04/12/2017  . Acute blood loss anemia   . Other encephalopathy 04/08/2017  . Encephalopathy 04/08/2017  . Altered mental status 04/08/2017  . Labile blood pressure   . Labile blood glucose   . Drug induced constipation   . Stage 3 chronic kidney disease ([place])   . Bacteremia   . S/P unilateral BKA (below knee amputation), right ([place]) 04/04/2017  . PAD (peripheral artery disease) ([place])   . Type 2 diabetes mellitus with right diabetic foot ulcer ([place])   . Post-operative pain   . Legally blind   . Upper GI bleed   . Streptococcal bacteremia 04/01/2017  . Hypokalemia 03/30/2017  . Uncontrolled type 2 diabetes mellitus with hyperglycemia, with long-term current use of insulin ([place]) 03/30/2017  . Type 2 diabetes mellitus with peripheral neuropathy ([place]) 03/30/2017  . AKI (acute kidney injury) ([place]) 03/30/2017  . CKD stage 3 due to type 2 diabetes mellitus ([place]) 03/30/2017  . Sepsis ([place]) 03/30/2017  . Heart murmur 11/22/2016  . Hyperlipidemia associated with type 2 diabetes mellitus ([place]) 11/22/2016  . Obstructive sleep apnea syndrome 11/22/2016  . Essential hypertension 12/17/2012   Phillips Grout PT, DPT, GCS  Huprich,Jason 08/15/2018, 10:04 AM  [place] MAIN [facility]  SERVICES [address] [place], [place], [address] Phone: [phone]   Fax:  [phone]  Name: Trevor [name] MRN: [id number] Date of Birth: Oct 22, 1955

## 2018-08-16 ENCOUNTER — Ambulatory Visit: Payer: Medicare Other

## 2018-08-16 ENCOUNTER — Other Ambulatory Visit: Payer: Self-pay

## 2018-08-16 VITALS — BP 121/66 | HR 66

## 2018-08-16 DIAGNOSIS — R2689 Other abnormalities of gait and mobility: Secondary | ICD-10-CM

## 2018-08-16 DIAGNOSIS — R2681 Unsteadiness on feet: Secondary | ICD-10-CM

## 2018-08-16 DIAGNOSIS — M6281 Muscle weakness (generalized): Secondary | ICD-10-CM | POA: Diagnosis not present

## 2018-08-16 NOTE — Therapy (Signed)
[place] [facility] SERVICES [address] [place], [place], [address] Phone: [phone]   Fax:  [phone]  Physical Therapy Treatment  Patient Details  Name: Trevor [name] MRN: [id number] Date of Birth: 11/16/1955 Referring Provider (PT): Jamse Arn, DR   Encounter Date: 08/16/2018  PT End of Session - 08/16/18 1507    Visit Number  64    Number of Visits  73    Date for PT Re-Evaluation  08/30/18    Authorization Type  Eval 08/23/17; Last goals 07/30/18    PT Start Time  1435    PT Stop Time  1515    PT Time Calculation (min)  40 min    Equipment Utilized During Treatment  Gait belt   Rt BKA prosthesis   Activity Tolerance  Patient tolerated treatment well;Patient limited by fatigue    Behavior During Therapy  WFL for tasks assessed/performed       Past Medical History:  Diagnosis Date  . Diabetes mellitus without complication ([place])   . Heart murmur   . Hyperlipidemia   . Hypertension   . Sleep apnea     Past Surgical History:  Procedure Laterality Date  . AMPUTATION Right 03/31/2017   Procedure: RIGHT FOOT 1ST AND 2ND RAY AMPUTATION;  Surgeon: Newt Minion, MD;  Location: [place];  Service: Orthopedics;  Laterality: Right;  . AMPUTATION Right 04/01/2017   Procedure: AMPUTATION BELOW KNEE;  Surgeon: Newt Minion, MD;  Location: [place];  Service: Orthopedics;  Laterality: Right;  . COLONOSCOPY WITH PROPOFOL N/A 10/02/2017   Procedure: COLONOSCOPY WITH PROPOFOL;  Surgeon: [name], MD;  Location: [facility] ENDOSCOPY;  Service: Gastroenterology;  Laterality: N/A;  . CORNEAL TRANSPLANT      Vitals:   08/16/18 1437  BP: 121/66  Pulse: 66  SpO2: 98%    Subjective Assessment - 08/16/18 1438    Subjective  Patient reports that he is doing well today. No pain or soreness upon arrival. No specific questions or concerns at this time.    Pertinent History  63 year old right-handed male with history of diabetes mellitus,  hypertension, legally blind, and CKD presenting for BKA 04/01/17. Patient wears contacts and can see better now.  Patient had two surgeries, one was a couple toes then they decided to go below the knee. 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 Has ambulated with RW in hospital with PT. Patient has had home health therapy and was in inpatient rehab about a month. Patient reports he was mainly sitting down, with occasional walking, last home health therapy in May. Patient reports some standing up but no exercises. Patient lives alone in the house and is scared they might fall. Patient received prosthetic leg Tuesday of last week.    Limitations  Standing;Walking;House hold activities;Other (comment)    How long can you stand comfortably?  10-15 minutes    How long can you walk comfortably?  10-15 minutes    Patient Stated Goals  going up steps and driving car.     Currently in Pain?  No/denies         TREATMENT   Therapeutic Exercise: Sit to stand without UE support from regular height chair x 5; 6" step toe taps no UE support alternating LE x 20 each LE, CGA due to occasional LOB; Airex pad 6" step toe taps no UE support alternating LE  x 20 each LE, CGA due to occasional LOB; 6" step-ups alternating leading LE no UE support x 10 on each side; Braiding in // bars to improve cross-over stepping strategy x 2 lengths each direction with seated rest break between reps due to fatigue;   Gait Training Gait trainingin [id number]' withsingle point cane progressing to no assistive device for a part of the distance but less than during prior session. Pt reports his back is more uncomfortable today and he is more easily fatigued. He is unable to ambulate as far today. Performed horizontal/verticalhead turns,gait speed changes, and quick stops. Twoseatedrest break required.Pt reports 4/10 low back pain today.    Pt  educated throughout session about proper posture and technique with exercises. Improved exercise technique, movement at target joints, use of target muscles after min to mod verbal, visual, tactile cues.   Pt displays good motivation throughout today's session. He reports that his back is more uncomfortable today and he is more easily fatigued. He is unable to ambulate as far today. He demonstrates improved single leg stability and is able to perform step-ups and toe taps without UE support. Pt encouraged to continue his current HEP and follow-up as scheduled. Pt will benefit from continued PT services to address balance strength, and functional mobility in order to improve his overall QoL.                        PT Short Term Goals - 07/30/18 1144      PT SHORT TERM GOAL #1   Title  Patient will be independent in home exercise program to improve strength/mobility for better functional independence with ADLs.    Baseline  HEP given, 12/11/17: Performing daily:     Time  2    Period  Weeks    Status  Achieved      PT SHORT TERM GOAL #2   Title  Patient will don/doff prosthesis independently to allow for increased mobility in home.     Baseline  8/14: requires PT to guide/direct through process 8/28: independent    Time  2    Period  Weeks    Status  Achieved      PT SHORT TERM GOAL #3   Title  Patient will ambulate 10 meters with least assistive device and prosthesis to improve mobility in home.     Baseline  8/14: not walking outside of // bars yet  8/28: 63 seconds with RW and CGA     Time  2    Period  Weeks    Status  Achieved      PT SHORT TERM GOAL #4   Title  Patient will perform STS with single UE support to decrease reliance upon UE's for stability     Baseline  patient requires BUE support 11/5: 1 UR support    Time  2    Period  Weeks    Status  Achieved        PT Long Term Goals - 07/30/18 1144      PT LONG TERM GOAL #1   Title  Patient will  ambulate >1028f during 663m with LRAD to demonstrate improved community ambulation and functional mobility    Baseline  11/5: will complete next session; 01/22/18: 400' in 3 minutes but had to stop secondary to pain in knee; 01/22/18: 400' in 3 minutes limited by R knee pain, 02/26/18: 500' in 3 minutes limited by low back pain; 06/07/18: Deferred due  to new prosthetic; 07/30/18: 800' with SPC (500' after 3 min)- 2 rest breaks, and ended 30 seconds early due to knee and back pain    Time  12    Period  Weeks    Status  Partially Met    Target Date  08/30/18      PT LONG TERM GOAL #2   Title  Patient (> 3 years old) will complete five times sit to stand test in < 15 seconds indicating an increased LE strength and improved balance.    Baseline  8/14: 61 seconds 8/28; 42 seconds 9/17: 24 seconds BUE support with LLE 10/10: 17 seconds with excessive BUE support  11/5: 18seconds BUE support; 12/11/17: 12/11/17: 18.5s with BUE support but no AD, 01/22/18: 17.1s BUE support but no AD; 02/26/18: 13.0s with BUE, still unable to perform without UE assistance; 06/07/18: 25.4s with single UE support during first rep only; 07/30/18: 24.3s with single UE support during 1st rep only    Time  12    Period  Weeks    Status  Partially Met    Target Date  08/30/18      PT LONG TERM GOAL #3   Title  Patient will reduce timed up and go to <11 seconds to reduce fall risk and demonstrate improved transfer/gait ability.    Baseline  8/14: unable to perform 8/28: 1 min 30 seconds 9/17: 38 seconds RW 10/10: 25 seconds with RW 11/5: 20 seconds with cane;, 12/11/17: 17.4s with spc; 01/22/18: 14.8s with spc; 02/26/18: 13.0s with spc; 06/07/18: 16.1s with RW; 07/30/18: 12.44s with SPC    Time  12    Period  Weeks    Status  Partially Met    Target Date  08/30/18      PT LONG TERM GOAL #4   Title  Patient will increase lower extremity functional scale to >60/80 to demonstrate improved functional mobility and increased tolerance with  ADLs.     Baseline  814: 29/80 8/28: 20/80 10/10: 20/80 11/5: 22/80; 12/11/17: 18/80; 01/22/18: 27/80; 06/07/18: 30/80; 07/30/18: 35/80    Time  12    Period  Weeks    Status  Partially Met    Target Date  08/30/18      PT LONG TERM GOAL #5   Title  Patient will perform 10 MWT in >1.0 m/s for improved mobility and safety with negotiating natural environment    Baseline  9/17: .53 m/s 10/10: .63 m/s with RW 11/5: 0.33ms; 12/11/17: 15.33s = 0.65 m/s with spc; 01/22/18: self-selected: 14.4s = 0.69 m/s, fastest: 11.8s = 0.85 m/s; 02/26/18: self-selected: 13.3s = 0.75 m/s, fastest: 10.6s = 0.94 m/s; 06/07/18: self-selected: 14.0s = 0.71 m/s, fastest: 11.7s = 0.85 m/s; 07/30/18: self selected: 12.45s = 0.816m, fastest:8.84s = 1.131m   Time  12    Period  Weeks    Status  Partially Met    Target Date  08/30/18      PT LONG TERM GOAL #6   Title  --    Baseline  --    Time  --    Period  --    Status  --      PT LONG TERM GOAL #7   Title  --    Baseline  --    Time  --    Period  --    Status  --      PT LONG TERM GOAL #8   Title  --    Baseline  --  Time  --    Period  --    Status  --      PT LONG TERM GOAL  #9   TITLE  --    Baseline  --    Time  --    Period  --    Status  --      PT LONG TERM GOAL  #10   TITLE  --    Baseline  --    Time  --    Period  --    Status  --      PT LONG TERM GOAL  #11   TITLE  --    Baseline  --    Time  --    Period  --    Status  --            Plan - 08/16/18 1509    Clinical Impression Statement  Pt displays good motivation throughout today's session. He reports that his back is more uncomfortable today and he is more easily fatigued. He is unable to ambulate as far today. He demonstrates improved single leg stability and is able to perform step-ups and toe taps without UE support. Pt encouraged to continue his current HEP and follow-up as scheduled. Pt will benefit from continued PT services to address balance strength, and  functional mobility in order to improve his overall QoL.    Examination-Participation Restrictions  Interpersonal Relationship    Rehab Potential  Fair    Clinical Impairments Affecting Rehab Potential  (+) previous independence, motivation to return to walking (-) lives alone, hx of diabetes, limited vision     PT Frequency  2x / week    PT Duration  8 weeks    PT Treatment/Interventions  ADLs/Self Care Home Management;Cryotherapy;Electrical Stimulation;Ultrasound;Traction;Moist Heat;DME Instruction;Gait training;Stair training;Functional mobility training;Therapeutic activities;Therapeutic exercise;Patient/family education;Neuromuscular re-education;Balance training;Prosthetic Training;Wheelchair mobility training;Manual techniques;Manual lymph drainage;Compression bandaging;Taping;Energy conservation;Passive range of motion    PT Next Visit Plan  walking with bags    PT Home Exercise Plan  see [id number] on medbridge, spc ambulation    Consulted and Agree with Plan of Care  Patient       Patient will benefit from skilled therapeutic intervention in order to improve the following deficits and impairments:  Abnormal gait, Decreased activity tolerance, Decreased balance, Decreased knowledge of precautions, Decreased endurance, Decreased knowledge of use of DME, Decreased mobility, Decreased range of motion, Difficulty walking, Decreased strength, Increased edema, Impaired flexibility, Impaired perceived functional ability, Prosthetic Dependency, Postural dysfunction, Improper body mechanics, Pain  Visit Diagnosis: 1. Muscle weakness (generalized)   2. Unsteadiness on feet   3. Other abnormalities of gait and mobility        Problem List Patient Active Problem List   Diagnosis Date Noted  . Pain due to onychomycosis of toenail of right foot 07/05/2018  . Abnormality of gait 10/12/2017  . Poorly controlled type 2 diabetes mellitus with peripheral neuropathy ([place])   . Flatulence   .  Hypomagnesemia   . Unilateral complete BKA, right, sequela ([place])   . Benign essential HTN   . Hypoalbuminemia due to protein-calorie malnutrition ([place])   . S/P below knee amputation, right ([place]) 04/12/2017  . Acute blood loss anemia   . Other encephalopathy 04/08/2017  . Encephalopathy 04/08/2017  . Altered mental status 04/08/2017  . Labile blood pressure   . Labile blood glucose   . Drug induced constipation   . Stage 3 chronic kidney disease ([place])   . Bacteremia   .  S/P unilateral BKA (below knee amputation), right ([place]) 04/04/2017  . PAD (peripheral artery disease) ([place])   . Type 2 diabetes mellitus with right diabetic foot ulcer ([place])   . Post-operative pain   . Legally blind   . Upper GI bleed   . Streptococcal bacteremia 04/01/2017  . Hypokalemia 03/30/2017  . Uncontrolled type 2 diabetes mellitus with hyperglycemia, with long-term current use of insulin ([place]) 03/30/2017  . Type 2 diabetes mellitus with peripheral neuropathy ([place]) 03/30/2017  . AKI (acute kidney injury) ([place]) 03/30/2017  . CKD stage 3 due to type 2 diabetes mellitus ([place]) 03/30/2017  . Sepsis ([place]) 03/30/2017  . Heart murmur 11/22/2016  . Hyperlipidemia associated with type 2 diabetes mellitus ([place]) 11/22/2016  . Obstructive sleep apnea syndrome 11/22/2016  . Essential hypertension 12/17/2012    Suleyman Ehrman 08/17/2018, 11:24 AM  [place] [facility] SERVICES [address] [place], [place], [address] Phone: [phone]   Fax:  [phone]  Name: DORRANCE SELLICK MRN: [id number] Date of Birth: Jul 04, 1955

## 2018-08-21 ENCOUNTER — Ambulatory Visit: Payer: Medicare Other

## 2018-08-21 ENCOUNTER — Other Ambulatory Visit: Payer: Self-pay

## 2018-08-21 DIAGNOSIS — R2689 Other abnormalities of gait and mobility: Secondary | ICD-10-CM

## 2018-08-21 DIAGNOSIS — M6281 Muscle weakness (generalized): Secondary | ICD-10-CM | POA: Diagnosis not present

## 2018-08-21 DIAGNOSIS — R2681 Unsteadiness on feet: Secondary | ICD-10-CM

## 2018-08-21 NOTE — Therapy (Signed)
[place] MAIN [facility] SERVICES [address] [place], [place], [address] Phone: [phone]   Fax:  [phone]  Physical Therapy Treatment  Patient Details  Name: Trevor [name] MRN: [id number] Date of Birth: Oct 14, 1955 Referring Provider (PT): Jamse Arn, DR   Encounter Date: 08/21/2018  PT End of Session - 08/21/18 1439    Visit Number  [id number]    Number of Visits  29    Date for PT Re-Evaluation  08/30/18    Authorization Type  Eval 08/23/17; Last goals 07/30/18    PT Start Time  1420    PT Stop Time  1500    PT Time Calculation (min)  40 min    Equipment Utilized During Treatment  Gait belt   Rt BKA prosthesis   Activity Tolerance  Patient tolerated treatment well;Patient limited by fatigue    Behavior During Therapy  WFL for tasks assessed/performed       Past Medical History:  Diagnosis Date  . Diabetes mellitus without complication ([place])   . Heart murmur   . Hyperlipidemia   . Hypertension   . Sleep apnea     Past Surgical History:  Procedure Laterality Date  . AMPUTATION Right 03/31/2017   Procedure: RIGHT FOOT 1ST AND 2ND RAY AMPUTATION;  Surgeon: Newt Minion, MD;  Location: [facility];  Service: Orthopedics;  Laterality: Right;  . AMPUTATION Right 04/01/2017   Procedure: AMPUTATION BELOW KNEE;  Surgeon: Newt Minion, MD;  Location: [place];  Service: Orthopedics;  Laterality: Right;  . COLONOSCOPY WITH PROPOFOL N/A 10/02/2017   Procedure: COLONOSCOPY WITH PROPOFOL;  Surgeon: [name], MD;  Location: [facility] ENDOSCOPY;  Service: Gastroenterology;  Laterality: N/A;  . CORNEAL TRANSPLANT      There were no vitals filed for this visit.  Subjective Assessment - 08/21/18 1424    Subjective  Patient reports no falls or LOB since last session. No complaints at this time.    Pertinent History  63 year old right-handed male with history of diabetes mellitus, hypertension, legally blind, and CKD presenting for BKA 04/01/17. Patient  wears contacts and can see better now.  Patient had two surgeries, one was a couple toes then they decided to go below the knee. 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 Has ambulated with RW in hospital with PT. Patient has had home health therapy and was in inpatient rehab about a month. Patient reports he was mainly sitting down, with occasional walking, last home health therapy in May. Patient reports some standing up but no exercises. Patient lives alone in the house and is scared they might fall. Patient received prosthetic leg Tuesday of last week.    Limitations  Standing;Walking;House hold activities;Other (comment)    How long can you stand comfortably?  10-15 minutes    How long can you walk comfortably?  10-15 minutes    Patient Stated Goals  going up steps and driving car.     Currently in Pain?  No/denies       Vitals: 145/73 pulse 63   Treatment:  Sit to stand without UE support from regular height chair x 5;  Airex pad 6" step toe taps no UE support alternating LE x 10 each LE, CGA due to occasional LOB;  Airex pad 6" step one foot on each surface: weighted ball (2000 gr) press 10x each LE back  Static stand tossing  football within // bars with CGA   Side stepping in // bars with slight knee flexion 4x length of // bars; cueing for upright posture and knee flexion for maximal muscle activation.   modified squat with UE support and chair behind with UE's on outside of / bars 10x good weight acceptance onto prosthetic limb   Standing hip extensions 10x     Hallway: -initiation /termination ambulation intervention in hallway 100 ft. CGA  With Min A with occasional LOB with sudden termination -Slowing and increasing velocity of ambulation in hallway 100 ft with PT verbalizing changing speed command.  -Ambulate without AD in hallway with CGA 200 ft. Increased velocity with no episodes of LOB     Pt  educated throughout session about proper posture and technique with exercises. Improved exercise technique, movement at target joints, use of target muscles after min to mod verbal, visual, tactile cues.       Pt will benefit from continued PT services to address balance strength, and functional mobility in order to improve his overall QoL.                       PT Education - 08/21/18 1425    Education Details  exercise technique, body mechanics    Person(s) Educated  Patient    Methods  Explanation;Demonstration;Tactile cues;Verbal cues    Comprehension  Verbalized understanding;Returned demonstration;Verbal cues required;Tactile cues required       PT Short Term Goals - 07/30/18 1144      PT SHORT TERM GOAL #1   Title  Patient will be independent in home exercise program to improve strength/mobility for better functional independence with ADLs.    Baseline  HEP given, 12/11/17: Performing daily:     Time  2    Period  Weeks    Status  Achieved      PT SHORT TERM GOAL #2   Title  Patient will don/doff prosthesis independently to allow for increased mobility in home.     Baseline  8/14: requires PT to guide/direct through process 8/28: independent    Time  2    Period  Weeks    Status  Achieved      PT SHORT TERM GOAL #3   Title  Patient will ambulate 10 meters with least assistive device and prosthesis to improve mobility in home.     Baseline  8/14: not walking outside of // bars yet  8/28: 63 seconds with RW and CGA     Time  2    Period  Weeks    Status  Achieved      PT SHORT TERM GOAL #4   Title  Patient will perform STS with single UE support to decrease reliance upon UE's for stability     Baseline  patient requires BUE support 11/5: 1 UR support    Time  2    Period  Weeks    Status  Achieved        PT Long Term Goals - 07/30/18 1144      PT LONG TERM GOAL #1   Title  Patient will ambulate >1060f during 613m with LRAD to demonstrate  improved community ambulation and functional mobility    Baseline  11/5: will complete next session; 01/22/18: 400' in 3 minutes but had to stop secondary to pain in knee; 01/22/18: 400' in 3 minutes limited by R knee pain, 02/26/18: 500' in 3 minutes limited by low back pain; 06/07/18:  Deferred due to new prosthetic; 07/30/18: 800' with SPC (500' after 3 min)- 2 rest breaks, and ended 30 seconds early due to knee and back pain    Time  12    Period  Weeks    Status  Partially Met    Target Date  08/30/18      PT LONG TERM GOAL #2   Title  Patient (> 20 years old) will complete five times sit to stand test in < 15 seconds indicating an increased LE strength and improved balance.    Baseline  8/14: 61 seconds 8/28; 42 seconds 9/17: 24 seconds BUE support with LLE 10/10: 17 seconds with excessive BUE support  11/5: 18seconds BUE support; 12/11/17: 12/11/17: 18.5s with BUE support but no AD, 01/22/18: 17.1s BUE support but no AD; 02/26/18: 13.0s with BUE, still unable to perform without UE assistance; 06/07/18: 25.4s with single UE support during first rep only; 07/30/18: 24.3s with single UE support during 1st rep only    Time  12    Period  Weeks    Status  Partially Met    Target Date  08/30/18      PT LONG TERM GOAL #3   Title  Patient will reduce timed up and go to <11 seconds to reduce fall risk and demonstrate improved transfer/gait ability.    Baseline  8/14: unable to perform 8/28: 1 min 30 seconds 9/17: 38 seconds RW 10/10: 25 seconds with RW 11/5: 20 seconds with cane;, 12/11/17: 17.4s with spc; 01/22/18: 14.8s with spc; 02/26/18: 13.0s with spc; 06/07/18: 16.1s with RW; 07/30/18: 12.44s with SPC    Time  12    Period  Weeks    Status  Partially Met    Target Date  08/30/18      PT LONG TERM GOAL #4   Title  Patient will increase lower extremity functional scale to >60/80 to demonstrate improved functional mobility and increased tolerance with ADLs.     Baseline  814: 29/80 8/28: 20/80 10/10: 20/80  11/5: 22/80; 12/11/17: 18/80; 01/22/18: 27/80; 06/07/18: 30/80; 07/30/18: 35/80    Time  12    Period  Weeks    Status  Partially Met    Target Date  08/30/18      PT LONG TERM GOAL #5   Title  Patient will perform 10 MWT in >1.0 m/s for improved mobility and safety with negotiating natural environment    Baseline  9/17: .53 m/s 10/10: .63 m/s with RW 11/5: 0.73ms; 12/11/17: 15.33s = 0.65 m/s with spc; 01/22/18: self-selected: 14.4s = 0.69 m/s, fastest: 11.8s = 0.85 m/s; 02/26/18: self-selected: 13.3s = 0.75 m/s, fastest: 10.6s = 0.94 m/s; 06/07/18: self-selected: 14.0s = 0.71 m/s, fastest: 11.7s = 0.85 m/s; 07/30/18: self selected: 12.45s = 0.827m, fastest:8.84s = 1.1373m   Time  12    Period  Weeks    Status  Partially Met    Target Date  08/30/18      PT LONG TERM GOAL #6   Title  --    Baseline  --    Time  --    Period  --    Status  --      PT LONG TERM GOAL #7   Title  --    Baseline  --    Time  --    Period  --    Status  --      PT LONG TERM GOAL #8   Title  --  Baseline  --    Time  --    Period  --    Status  --      PT LONG TERM GOAL  #9   TITLE  --    Baseline  --    Time  --    Period  --    Status  --      PT LONG TERM GOAL  #10   TITLE  --    Baseline  --    Time  --    Period  --    Status  --      PT LONG TERM GOAL  #11   TITLE  --    Baseline  --    Time  --    Period  --    Status  --            Plan - 08/22/18 0809    Clinical Impression Statement  Patient demonstrates stability with changing velocity of ambulation however does have intermittent instability with sudden termination of ambulation. Patient is challenged maintaining squat position with mobility resulting in limited capacity for mobility. He will continue to benefit from skilled physical therapy to improve functional mobility, improve prosthetic use and increase ambulation to increase LOF and improve overall QOL.    Examination-Participation Restrictions  Interpersonal  Relationship    Rehab Potential  Fair    Clinical Impairments Affecting Rehab Potential  (+) previous independence, motivation to return to walking (-) lives alone, hx of diabetes, limited vision     PT Frequency  2x / week    PT Duration  8 weeks    PT Treatment/Interventions  ADLs/Self Care Home Management;Cryotherapy;Electrical Stimulation;Ultrasound;Traction;Moist Heat;DME Instruction;Gait training;Stair training;Functional mobility training;Therapeutic activities;Therapeutic exercise;Patient/family education;Neuromuscular re-education;Balance training;Prosthetic Training;Wheelchair mobility training;Manual techniques;Manual lymph drainage;Compression bandaging;Taping;Energy conservation;Passive range of motion    PT Next Visit Plan  walking with bags    PT Home Exercise Plan  see [id number] on medbridge, spc ambulation    Consulted and Agree with Plan of Care  Patient       Patient will benefit from skilled therapeutic intervention in order to improve the following deficits and impairments:  Abnormal gait, Decreased activity tolerance, Decreased balance, Decreased knowledge of precautions, Decreased endurance, Decreased knowledge of use of DME, Decreased mobility, Decreased range of motion, Difficulty walking, Decreased strength, Increased edema, Impaired flexibility, Impaired perceived functional ability, Prosthetic Dependency, Postural dysfunction, Improper body mechanics, Pain  Visit Diagnosis: 1. Muscle weakness (generalized)   2. Unsteadiness on feet   3. Other abnormalities of gait and mobility        Problem List Patient Active Problem List   Diagnosis Date Noted  . Pain due to onychomycosis of toenail of right foot 07/05/2018  . Abnormality of gait 10/12/2017  . Poorly controlled type 2 diabetes mellitus with peripheral neuropathy ([place])   . Flatulence   . Hypomagnesemia   . Unilateral complete BKA, right, sequela ([place])   . Benign essential HTN   . Hypoalbuminemia due to  protein-calorie malnutrition ([place])   . S/P below knee amputation, right ([place]) 04/12/2017  . Acute blood loss anemia   . Other encephalopathy 04/08/2017  . Encephalopathy 04/08/2017  . Altered mental status 04/08/2017  . Labile blood pressure   . Labile blood glucose   . Drug induced constipation   . Stage 3 chronic kidney disease ([place])   . Bacteremia   . S/P unilateral BKA (below knee amputation), right ([place]) 04/04/2017  . PAD (peripheral artery  disease) ([place])   . Type 2 diabetes mellitus with right diabetic foot ulcer ([place])   . Post-operative pain   . Legally blind   . Upper GI bleed   . Streptococcal bacteremia 04/01/2017  . Hypokalemia 03/30/2017  . Uncontrolled type 2 diabetes mellitus with hyperglycemia, with long-term current use of insulin ([place]) 03/30/2017  . Type 2 diabetes mellitus with peripheral neuropathy ([place]) 03/30/2017  . AKI (acute kidney injury) ([place]) 03/30/2017  . CKD stage 3 due to type 2 diabetes mellitus ([place]) 03/30/2017  . Sepsis ([place]) 03/30/2017  . Heart murmur 11/22/2016  . Hyperlipidemia associated with type 2 diabetes mellitus ([place]) 11/22/2016  . Obstructive sleep apnea syndrome 11/22/2016  . Essential hypertension 12/17/2012   Janna Arch, PT, DPT    08/22/2018, 8:12 AM  [place] MAIN [facility] SERVICES [address] [place], [place], [address] Phone: [phone]   Fax:  [phone]  Name: Trevor [name] MRN: [id number] Date of Birth: 1955-08-21

## 2018-08-23 ENCOUNTER — Ambulatory Visit: Payer: Medicare Other

## 2018-08-23 ENCOUNTER — Other Ambulatory Visit: Payer: Self-pay

## 2018-08-23 DIAGNOSIS — Z20822 Contact with and (suspected) exposure to covid-19: Secondary | ICD-10-CM

## 2018-08-23 DIAGNOSIS — M6281 Muscle weakness (generalized): Secondary | ICD-10-CM

## 2018-08-23 DIAGNOSIS — S88111S Complete traumatic amputation at level between knee and ankle, right lower leg, sequela: Secondary | ICD-10-CM

## 2018-08-23 DIAGNOSIS — R2681 Unsteadiness on feet: Secondary | ICD-10-CM

## 2018-08-23 DIAGNOSIS — R2689 Other abnormalities of gait and mobility: Secondary | ICD-10-CM

## 2018-08-23 NOTE — Therapy (Signed)
[place] MAIN [facility] SERVICES [address] [place], [place], [address] Phone: [phone]   Fax:  [phone]  Physical Therapy Treatment  Patient Details  Name: Trevor [name] MRN: [id number] Date of Birth: 1955-09-15 Referring Provider (PT): Jamse Arn, DR   Encounter Date: 08/23/2018  PT End of Session - 08/23/18 1705    Visit Number  69    Number of Visits  37    Date for PT Re-Evaluation  08/30/18    Authorization Type  Eval 08/23/17; Last goals 07/30/18    PT Start Time  1435    PT Stop Time  1515    PT Time Calculation (min)  40 min    Equipment Utilized During Treatment  Gait belt   Rt BKA prosthesis   Activity Tolerance  Patient tolerated treatment well;Patient limited by fatigue    Behavior During Therapy  WFL for tasks assessed/performed       Past Medical History:  Diagnosis Date  . Diabetes mellitus without complication ([place])   . Heart murmur   . Hyperlipidemia   . Hypertension   . Sleep apnea     Past Surgical History:  Procedure Laterality Date  . AMPUTATION Right 03/31/2017   Procedure: RIGHT FOOT 1ST AND 2ND RAY AMPUTATION;  Surgeon: Newt Minion, MD;  Location: [facility];  Service: Orthopedics;  Laterality: Right;  . AMPUTATION Right 04/01/2017   Procedure: AMPUTATION BELOW KNEE;  Surgeon: Newt Minion, MD;  Location: [facility];  Service: Orthopedics;  Laterality: Right;  . COLONOSCOPY WITH PROPOFOL N/A 10/02/2017   Procedure: COLONOSCOPY WITH PROPOFOL;  Surgeon: [name], MD;  Location: [facility] ENDOSCOPY;  Service: Gastroenterology;  Laterality: N/A;  . CORNEAL TRANSPLANT      There were no vitals filed for this visit.  Subjective Assessment - 08/23/18 1436    Subjective  Patient's wheelchair at home broke. Patient reports no falls or LOB since last session. No complaints or concerns at this time.    Pertinent History  63 year old right-handed male with history of diabetes mellitus, hypertension, legally  blind, and CKD presenting for BKA 04/01/17. Patient wears contacts and can see better now.  Patient had two surgeries, one was a couple toes then they decided to go below the knee. 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 Has ambulated with RW in hospital with PT. Patient has had home health therapy and was in inpatient rehab about a month. Patient reports he was mainly sitting down, with occasional walking, last home health therapy in May. Patient reports some standing up but no exercises. Patient lives alone in the house and is scared they might fall. Patient received prosthetic leg Tuesday of last week.    Limitations  Standing;Walking;House hold activities;Other (comment)    How long can you stand comfortably?  10-15 minutes    How long can you walk comfortably?  10-15 minutes    Patient Stated Goals  going up steps and driving car.     Currently in Pain?  No/denies          vitals: 146/76 pulse 64   Treatment:   Bosu ball forward lunge with pelvis forward motion. More challenging to weightbear through RLE/prosthetic limb, SUE to no UE support for intermittent, 12x each LE.   Bosu ball lateral lunge with pelvis lateral motion, bend of knee on Bosu, straight leg of leg on  ground, No UE support 12x each LE    Airex pad 6" step one foot on each surface: weighted ball (2000 gr) press 10x each LE back   Hedgehog taps with PT calling out color and LE to toe touch. Challenged with RLE weight acceptance with two near LOB  modified squat with UE support and chair behind with UE's on outside of / bars 10x good weight acceptance onto prosthetic limb; cueing for widening BOS for optimal muscle recruitment.    3lb ankle weights:  -Standing hip extensions 10x  -standing marches 15x each leg  seated arms crossed upright trunk position marches 10x each LE GTB adduction 15x each LE.  GTB abduction 12x with 3-5 second pulse at  full position   Pt educated throughout session about proper posture and technique with exercises. Improved exercise technique, movement at target joints, use of target muscles after min to mod verbal, visual, tactile cues.                  PT Education - 08/23/18 1704    Education Details  exercise technique, body mechanics, stability    Person(s) Educated  Patient    Methods  Explanation;Demonstration;Tactile cues;Verbal cues    Comprehension  Verbalized understanding;Returned demonstration;Verbal cues required;Tactile cues required;Need further instruction       PT Short Term Goals - 07/30/18 1144      PT SHORT TERM GOAL #1   Title  Patient will be independent in home exercise program to improve strength/mobility for better functional independence with ADLs.    Baseline  HEP given, 12/11/17: Performing daily:     Time  2    Period  Weeks    Status  Achieved      PT SHORT TERM GOAL #2   Title  Patient will don/doff prosthesis independently to allow for increased mobility in home.     Baseline  8/14: requires PT to guide/direct through process 8/28: independent    Time  2    Period  Weeks    Status  Achieved      PT SHORT TERM GOAL #3   Title  Patient will ambulate 10 meters with least assistive device and prosthesis to improve mobility in home.     Baseline  8/14: not walking outside of // bars yet  8/28: 63 seconds with RW and CGA     Time  2    Period  Weeks    Status  Achieved      PT SHORT TERM GOAL #4   Title  Patient will perform STS with single UE support to decrease reliance upon UE's for stability     Baseline  patient requires BUE support 11/5: 1 UR support    Time  2    Period  Weeks    Status  Achieved        PT Long Term Goals - 07/30/18 1144      PT LONG TERM GOAL #1   Title  Patient will ambulate >1073f during 647m with LRAD to demonstrate improved community ambulation and functional mobility    Baseline  11/5: will complete next  session; 01/22/18: 400' in 3 minutes but had to stop secondary to pain in knee; 01/22/18: 400' in 3 minutes limited by R knee pain, 02/26/18: 500' in 3 minutes limited by low back pain; 06/07/18: Deferred due to new prosthetic; 07/30/18: 800' with SPC (500' after 3 min)- 2 rest breaks, and ended 30 seconds early due to knee and  back pain    Time  12    Period  Weeks    Status  Partially Met    Target Date  08/30/18      PT LONG TERM GOAL #2   Title  Patient (> 45 years old) will complete five times sit to stand test in < 15 seconds indicating an increased LE strength and improved balance.    Baseline  8/14: 61 seconds 8/28; 42 seconds 9/17: 24 seconds BUE support with LLE 10/10: 17 seconds with excessive BUE support  11/5: 18seconds BUE support; 12/11/17: 12/11/17: 18.5s with BUE support but no AD, 01/22/18: 17.1s BUE support but no AD; 02/26/18: 13.0s with BUE, still unable to perform without UE assistance; 06/07/18: 25.4s with single UE support during first rep only; 07/30/18: 24.3s with single UE support during 1st rep only    Time  12    Period  Weeks    Status  Partially Met    Target Date  08/30/18      PT LONG TERM GOAL #3   Title  Patient will reduce timed up and go to <11 seconds to reduce fall risk and demonstrate improved transfer/gait ability.    Baseline  8/14: unable to perform 8/28: 1 min 30 seconds 9/17: 38 seconds RW 10/10: 25 seconds with RW 11/5: 20 seconds with cane;, 12/11/17: 17.4s with spc; 01/22/18: 14.8s with spc; 02/26/18: 13.0s with spc; 06/07/18: 16.1s with RW; 07/30/18: 12.44s with SPC    Time  12    Period  Weeks    Status  Partially Met    Target Date  08/30/18      PT LONG TERM GOAL #4   Title  Patient will increase lower extremity functional scale to >60/80 to demonstrate improved functional mobility and increased tolerance with ADLs.     Baseline  814: 29/80 8/28: 20/80 10/10: 20/80 11/5: 22/80; 12/11/17: 18/80; 01/22/18: 27/80; 06/07/18: 30/80; 07/30/18: 35/80    Time  12     Period  Weeks    Status  Partially Met    Target Date  08/30/18      PT LONG TERM GOAL #5   Title  Patient will perform 10 MWT in >1.0 m/s for improved mobility and safety with negotiating natural environment    Baseline  9/17: .53 m/s 10/10: .63 m/s with RW 11/5: 0.2ms; 12/11/17: 15.33s = 0.65 m/s with spc; 01/22/18: self-selected: 14.4s = 0.69 m/s, fastest: 11.8s = 0.85 m/s; 02/26/18: self-selected: 13.3s = 0.75 m/s, fastest: 10.6s = 0.94 m/s; 06/07/18: self-selected: 14.0s = 0.71 m/s, fastest: 11.7s = 0.85 m/s; 07/30/18: self selected: 12.45s = 0.835m, fastest:8.84s = 1.1349m   Time  12    Period  Weeks    Status  Partially Met    Target Date  08/30/18      PT LONG TERM GOAL #6   Title  --    Baseline  --    Time  --    Period  --    Status  --      PT LONG TERM GOAL #7   Title  --    Baseline  --    Time  --    Period  --    Status  --      PT LONG TERM GOAL #8   Title  --    Baseline  --    Time  --    Period  --    Status  --  PT LONG TERM GOAL  #9   TITLE  --    Baseline  --    Time  --    Period  --    Status  --      PT LONG TERM GOAL  #10   TITLE  --    Baseline  --    Time  --    Period  --    Status  --      PT LONG TERM GOAL  #11   TITLE  --    Baseline  --    Time  --    Period  --    Status  --            Plan - 08/23/18 1708    Clinical Impression Statement  Patient is challenged with weight acceptance during single limb stance on prosthetic limb without UE support. His residual limb fatigues quicker than his LLE resulting in compensatory mechanisms and increased UE support with prolonged standing tasks. Patient fatigues quickly with standing tasks requiring seated rest breaks between interventions. He will continue to benefit from skilled physical therapy to improve functional mobility, improve prosthetic use and increase ambulation to increase LOF and improve overall QOL.    Examination-Participation Restrictions  Interpersonal  Relationship    Rehab Potential  Fair    Clinical Impairments Affecting Rehab Potential  (+) previous independence, motivation to return to walking (-) lives alone, hx of diabetes, limited vision     PT Frequency  2x / week    PT Duration  8 weeks    PT Treatment/Interventions  ADLs/Self Care Home Management;Cryotherapy;Electrical Stimulation;Ultrasound;Traction;Moist Heat;DME Instruction;Gait training;Stair training;Functional mobility training;Therapeutic activities;Therapeutic exercise;Patient/family education;Neuromuscular re-education;Balance training;Prosthetic Training;Wheelchair mobility training;Manual techniques;Manual lymph drainage;Compression bandaging;Taping;Energy conservation;Passive range of motion    PT Next Visit Plan  progression of SLS on prosthetic limb,    PT Home Exercise Plan  see [id number] on medbridge, spc ambulation    Consulted and Agree with Plan of Care  Patient       Patient will benefit from skilled therapeutic intervention in order to improve the following deficits and impairments:  Abnormal gait, Decreased activity tolerance, Decreased balance, Decreased knowledge of precautions, Decreased endurance, Decreased knowledge of use of DME, Decreased mobility, Decreased range of motion, Difficulty walking, Decreased strength, Increased edema, Impaired flexibility, Impaired perceived functional ability, Prosthetic Dependency, Postural dysfunction, Improper body mechanics, Pain  Visit Diagnosis: 1. Muscle weakness (generalized)   2. Unsteadiness on feet   3. Other abnormalities of gait and mobility   4. Unilateral complete BKA, right, sequela [facility])        Problem List Patient Active Problem List   Diagnosis Date Noted  . Pain due to onychomycosis of toenail of right foot 07/05/2018  . Abnormality of gait 10/12/2017  . Poorly controlled type 2 diabetes mellitus with peripheral neuropathy ([place])   . Flatulence   . Hypomagnesemia   . Unilateral complete BKA,  right, sequela ([place])   . Benign essential HTN   . Hypoalbuminemia due to protein-calorie malnutrition ([place])   . S/P below knee amputation, right ([place]) 04/12/2017  . Acute blood loss anemia   . Other encephalopathy 04/08/2017  . Encephalopathy 04/08/2017  . Altered mental status 04/08/2017  . Labile blood pressure   . Labile blood glucose   . Drug induced constipation   . Stage 3 chronic kidney disease ([place])   . Bacteremia   . S/P unilateral BKA (below knee amputation), right ([place]) 04/04/2017  . PAD (  peripheral artery disease) ([place])   . Type 2 diabetes mellitus with right diabetic foot ulcer ([place])   . Post-operative pain   . Legally blind   . Upper GI bleed   . Streptococcal bacteremia 04/01/2017  . Hypokalemia 03/30/2017  . Uncontrolled type 2 diabetes mellitus with hyperglycemia, with long-term current use of insulin ([place]) 03/30/2017  . Type 2 diabetes mellitus with peripheral neuropathy ([place]) 03/30/2017  . AKI (acute kidney injury) ([place]) 03/30/2017  . CKD stage 3 due to type 2 diabetes mellitus ([place]) 03/30/2017  . Sepsis ([place]) 03/30/2017  . Heart murmur 11/22/2016  . Hyperlipidemia associated with type 2 diabetes mellitus ([place]) 11/22/2016  . Obstructive sleep apnea syndrome 11/22/2016  . Essential hypertension 12/17/2012   Janna Arch, PT, DPT   08/23/2018, 5:09 PM  [place] MAIN [facility] SERVICES [address] [place], [place], [address] Phone: [phone]   Fax:  [phone]  Name: Trevor [name] MRN: [id number] Date of Birth: July 22, 1955

## 2018-08-24 LAB — NOVEL CORONAVIRUS, NAA: SARS-CoV-2, NAA: NOT DETECTED

## 2018-08-28 ENCOUNTER — Ambulatory Visit: Payer: Medicare Other

## 2018-08-30 ENCOUNTER — Ambulatory Visit: Payer: Medicare Other

## 2018-08-30 ENCOUNTER — Other Ambulatory Visit: Payer: Self-pay

## 2018-08-30 DIAGNOSIS — R2689 Other abnormalities of gait and mobility: Secondary | ICD-10-CM

## 2018-08-30 DIAGNOSIS — R2681 Unsteadiness on feet: Secondary | ICD-10-CM

## 2018-08-30 DIAGNOSIS — S88111S Complete traumatic amputation at level between knee and ankle, right lower leg, sequela: Secondary | ICD-10-CM

## 2018-08-30 DIAGNOSIS — M6281 Muscle weakness (generalized): Secondary | ICD-10-CM

## 2018-08-30 NOTE — Therapy (Signed)
[place] MAIN [facility] SERVICES [address] [place], [place], [address] Phone: [phone]   Fax:  [phone]  Physical Therapy Treatment/ RECERT  Patient Details  Name: Trevor [name] MRN: [id number] Date of Birth: Aug 25, 1955 No data recorded  Encounter Date: 08/30/2018  PT End of Session - 08/30/18 1602    Visit Number  54    Number of Visits  91    Date for PT Re-Evaluation  11/22/18    Authorization Type  Eval 08/23/17; Last goals 07/30/18    PT Start Time  1430    PT Stop Time  1510    PT Time Calculation (min)  40 min    Equipment Utilized During Treatment  Gait belt   Rt BKA prosthesis   Activity Tolerance  Patient tolerated treatment well;Patient limited by fatigue    Behavior During Therapy  WFL for tasks assessed/performed       Past Medical History:  Diagnosis Date  . Diabetes mellitus without complication ([place])   . Heart murmur   . Hyperlipidemia   . Hypertension   . Sleep apnea     Past Surgical History:  Procedure Laterality Date  . AMPUTATION Right 03/31/2017   Procedure: RIGHT FOOT 1ST AND 2ND RAY AMPUTATION;  Surgeon: Newt Minion, MD;  Location: [place];  Service: Orthopedics;  Laterality: Right;  . AMPUTATION Right 04/01/2017   Procedure: AMPUTATION BELOW KNEE;  Surgeon: Newt Minion, MD;  Location: [place];  Service: Orthopedics;  Laterality: Right;  . COLONOSCOPY WITH PROPOFOL N/A 10/02/2017   Procedure: COLONOSCOPY WITH PROPOFOL;  Surgeon: [name], MD;  Location: [facility] ENDOSCOPY;  Service: Gastroenterology;  Laterality: N/A;  . CORNEAL TRANSPLANT      There were no vitals filed for this visit.  Subjective Assessment - 08/30/18 1438    Subjective  Patient has gotten a new manual chair to replace his broken one. Had to go to [facility] for the replacement. No LOB or falls since last session.    Pertinent History  63 year old right-handed male with history of diabetes mellitus, hypertension, legally blind,  and CKD presenting for BKA 04/01/17. Patient wears contacts and can see better now.  Patient had two surgeries, one was a couple toes then they decided to go below the knee. 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 Has ambulated with RW in hospital with PT. Patient has had home health therapy and was in inpatient rehab about a month. Patient reports he was mainly sitting down, with occasional walking, last home health therapy in May. Patient reports some standing up but no exercises. Patient lives alone in the house and is scared they might fall. Patient received prosthetic leg Tuesday of last week.    Limitations  Standing;Walking;House hold activities;Other (comment)    How long can you stand comfortably?  10-15 minutes    How long can you walk comfortably?  10-15 minutes    Patient Stated Goals  going up steps and driving car.     Currently in Pain?  No/denies       Vitals 003/70 pulse 61   RECERT  Goals:  6 MWT: 600 ft 3 min 20 seconds; ambulate with use of SPC. terminated after 3 minutes 20 seconds due to back pain  5x STS: 10 seconds with BUE support :SUE support min A 16 seconds  TUG 11 seconds with SPC  LEFS:  36/80  10 MWT: 9 seconds =1.1 m/s with SPC;   Phantom pain of residual limb calf. , relieved with roller to L calf which relieved phantom pain. x4 minutes     Patient has gotten a new manual chair to replace his broken one. Had to go to [facility] for the replacement.         Patient's six minute walk test demonstrated improved gait velocity during the first three minutes as patient walked 100 ft further than one month prior however patient's back pain limited him from progressing further and had to stop the test early. Patient has met goal for TUG and 10 MWT at this time demonstrating functional mobility progression. He continues to rely on use of an AD and is limited in duration of ambulation as well as  stability with negotiation of obstacles. between interventions. He will continue to benefit from skilled physical therapy to improve functional mobility, improve prosthetic use and increase ambulation to increase LOF and improve overall QOL.            PT Education - 08/30/18 1438    Education Details  goals, exercise technique, POC    Person(s) Educated  Patient    Methods  Explanation;Demonstration;Tactile cues;Verbal cues    Comprehension  Verbalized understanding;Returned demonstration;Verbal cues required;Tactile cues required       PT Short Term Goals - 08/30/18 1701      PT SHORT TERM GOAL #1   Title  Patient will be independent in home exercise program to improve strength/mobility for better functional independence with ADLs.    Baseline  HEP given, 12/11/17: Performing daily:     Time  2    Period  Weeks    Status  Achieved      PT SHORT TERM GOAL #2   Title  Patient will don/doff prosthesis independently to allow for increased mobility in home.     Baseline  8/14: requires PT to guide/direct through process 8/28: independent    Time  2    Period  Weeks    Status  Achieved      PT SHORT TERM GOAL #3   Title  Patient will ambulate 10 meters with least assistive device and prosthesis to improve mobility in home.     Baseline  8/14: not walking outside of // bars yet  8/28: 63 seconds with RW and CGA     Time  2    Period  Weeks    Status  Achieved      PT SHORT TERM GOAL #4   Title  Patient will perform STS with single UE support to decrease reliance upon UE's for stability     Baseline  patient requires BUE support 11/5: 1 UR support    Time  2    Period  Weeks    Status  Achieved        PT Long Term Goals - 08/30/18 1701      PT LONG TERM GOAL #1   Title  Patient will ambulate >102f during 655m with LRAD to demonstrate improved community ambulation and functional mobility    Baseline  11/5: will complete next session; 01/22/18: 400' in 3 minutes but  had to stop secondary to pain in knee; 01/22/18: 400' in 3 minutes limited by R knee pain, 02/26/18: 500' in 3 minutes limited by low back pain; 06/07/18: Deferred due to new prosthetic; 07/30/18: 800' with SPC (500' after 3 min)- 2 rest breaks, and ended 30 seconds early  due to knee and back pain 8/20 : 600 ft in 3 minutes and 20 seconds; terminated after this due to back pain    Time  12    Period  Weeks    Status  Partially Met    Target Date  11/22/18      PT LONG TERM GOAL #2   Title  Patient (> 60 years old) will complete five times sit to stand test in < 15 seconds indicating an increased LE strength and improved balance.    Baseline  8/14: 61 seconds 8/28; 42 seconds 9/17: 24 seconds BUE support with LLE 10/10: 17 seconds with excessive BUE support  11/5: 18seconds BUE support; 12/11/17: 12/11/17: 18.5s with BUE support but no AD, 01/22/18: 17.1s BUE support but no AD; 02/26/18: 13.0s with BUE, still unable to perform without UE assistance; 06/07/18: 25.4s with single UE support during first rep only; 07/30/18: 24.3s with single UE support during 1st rep only 8/20: 10 seconds BUE support, 16 seconds SUE support    Time  12    Period  Weeks    Status  Partially Met    Target Date  11/22/18      PT LONG TERM GOAL #3   Title  Patient will reduce timed up and go to <11 seconds to reduce fall risk and demonstrate improved transfer/gait ability.    Baseline  8/14: unable to perform 8/28: 1 min 30 seconds 9/17: 38 seconds RW 10/10: 25 seconds with RW 11/5: 20 seconds with cane;, 12/11/17: 17.4s with spc; 01/22/18: 14.8s with spc; 02/26/18: 13.0s with spc; 06/07/18: 16.1s with RW; 07/30/18: 12.44s with SPC 8/20: 11 seconds with SPC    Time  12    Period  Weeks    Status  Achieved      PT LONG TERM GOAL #4   Title  Patient will increase lower extremity functional scale to >60/80 to demonstrate improved functional mobility and increased tolerance with ADLs.     Baseline  814: 29/80 8/28: 20/80 10/10: 20/80  11/5: 22/80; 12/11/17: 18/80; 01/22/18: 27/80; 06/07/18: 30/80; 07/30/18: 35/80 8/20: 36/80    Time  12    Period  Weeks    Status  Partially Met    Target Date  11/22/18      PT LONG TERM GOAL #5   Title  Patient will perform 10 MWT in >1.0 m/s for improved mobility and safety with negotiating natural environment    Baseline  9/17: .53 m/s 10/10: .63 m/s with RW 11/5: 0.72ms; 12/11/17: 15.33s = 0.65 m/s with spc; 01/22/18: self-selected: 14.4s = 0.69 m/s, fastest: 11.8s = 0.85 m/s; 02/26/18: self-selected: 13.3s = 0.75 m/s, fastest: 10.6s = 0.94 m/s; 06/07/18: self-selected: 14.0s = 0.71 m/s, fastest: 11.7s = 0.85 m/s; 07/30/18: self selected: 12.45s = 0.849m, fastest:8.84s = 1.1337m8/20: 1.1 m/s with SPC    Time  12    Period  Weeks    Status  Achieved      PT LONG TERM GOAL #6   Title  Patient will be mod I for negotiating curb with LRAD while wearing prosthetic for safe community ambulation.    Baseline  8/20: patient not able to negotiate curbs    Time  12    Period  Weeks    Status  New    Target Date  11/22/18            Plan - 08/30/18 1710    Clinical Impression Statement  Patient's six minute walk  test demonstrated improved gait velocity during the first three minutes as patient walked 100 ft further than one month prior however patient's back pain limited him from progressing further and had to stop the test early. Patient has met goal for TUG and 10 MWT at this time demonstrating functional mobility progression. He continues to rely on use of an AD and is limited in duration of ambulation as well as stability with negotiation of obstacles. between interventions. He will continue to benefit from skilled physical therapy to improve functional mobility, improve prosthetic use and increase ambulation to increase LOF and improve overall QOL.    Examination-Participation Restrictions  Interpersonal Relationship    Rehab Potential  Fair    Clinical Impairments Affecting Rehab Potential   (+) previous independence, motivation to return to walking (-) lives alone, hx of diabetes, limited vision     PT Frequency  2x / week    PT Duration  8 weeks    PT Treatment/Interventions  ADLs/Self Care Home Management;Cryotherapy;Electrical Stimulation;Ultrasound;Traction;Moist Heat;DME Instruction;Gait training;Stair training;Functional mobility training;Therapeutic activities;Therapeutic exercise;Patient/family education;Neuromuscular re-education;Balance training;Prosthetic Training;Wheelchair mobility training;Manual techniques;Manual lymph drainage;Compression bandaging;Taping;Energy conservation;Passive range of motion    PT Next Visit Plan  progression of SLS on prosthetic limb,    PT Home Exercise Plan  see [id number] on medbridge, spc ambulation    Consulted and Agree with Plan of Care  Patient       Patient will benefit from skilled therapeutic intervention in order to improve the following deficits and impairments:  Abnormal gait, Decreased activity tolerance, Decreased balance, Decreased knowledge of precautions, Decreased endurance, Decreased knowledge of use of DME, Decreased mobility, Decreased range of motion, Difficulty walking, Decreased strength, Increased edema, Impaired flexibility, Impaired perceived functional ability, Prosthetic Dependency, Postural dysfunction, Improper body mechanics, Pain  Visit Diagnosis: Muscle weakness (generalized)  Unsteadiness on feet  Other abnormalities of gait and mobility  Unilateral complete BKA, right, sequela ([place])     Problem List Patient Active Problem List   Diagnosis Date Noted  . Pain due to onychomycosis of toenail of right foot 07/05/2018  . Abnormality of gait 10/12/2017  . Poorly controlled type 2 diabetes mellitus with peripheral neuropathy ([place])   . Flatulence   . Hypomagnesemia   . Unilateral complete BKA, right, sequela ([place])   . Benign essential HTN   . Hypoalbuminemia due to protein-calorie malnutrition ([place])    . S/P below knee amputation, right ([place]) 04/12/2017  . Acute blood loss anemia   . Other encephalopathy 04/08/2017  . Encephalopathy 04/08/2017  . Altered mental status 04/08/2017  . Labile blood pressure   . Labile blood glucose   . Drug induced constipation   . Stage 3 chronic kidney disease ([place])   . Bacteremia   . S/P unilateral BKA (below knee amputation), right ([place]) 04/04/2017  . PAD (peripheral artery disease) ([place])   . Type 2 diabetes mellitus with right diabetic foot ulcer ([place])   . Post-operative pain   . Legally blind   . Upper GI bleed   . Streptococcal bacteremia 04/01/2017  . Hypokalemia 03/30/2017  . Uncontrolled type 2 diabetes mellitus with hyperglycemia, with long-term current use of insulin ([place]) 03/30/2017  . Type 2 diabetes mellitus with peripheral neuropathy ([place]) 03/30/2017  . AKI (acute kidney injury) ([place]) 03/30/2017  . CKD stage 3 due to type 2 diabetes mellitus ([place]) 03/30/2017  . Sepsis ([place]) 03/30/2017  . Heart murmur 11/22/2016  . Hyperlipidemia associated with type 2 diabetes mellitus ([place]) 11/22/2016  . Obstructive sleep  apnea syndrome 11/22/2016  . Essential hypertension 12/17/2012   Janna Arch, PT, DPT   08/30/2018, 5:10 PM  [place] MAIN [facility] SERVICES [address] [place], [place], [address] Phone: [phone]   Fax:  [phone]  Name: CROSLEY STEJSKAL MRN: [id number] Date of Birth: Jun 02, 1955

## 2018-09-04 ENCOUNTER — Other Ambulatory Visit: Payer: Self-pay

## 2018-09-04 ENCOUNTER — Ambulatory Visit: Payer: Medicare Other

## 2018-09-04 DIAGNOSIS — R2681 Unsteadiness on feet: Secondary | ICD-10-CM

## 2018-09-04 DIAGNOSIS — S88111S Complete traumatic amputation at level between knee and ankle, right lower leg, sequela: Secondary | ICD-10-CM

## 2018-09-04 DIAGNOSIS — M6281 Muscle weakness (generalized): Secondary | ICD-10-CM | POA: Diagnosis not present

## 2018-09-04 DIAGNOSIS — R2689 Other abnormalities of gait and mobility: Secondary | ICD-10-CM

## 2018-09-04 NOTE — Therapy (Signed)
[place] MAIN [facility] SERVICES [address] [place], [place], [address] Phone: [phone]   Fax:  [phone]  Physical Therapy Treatment  Patient Details  Name: Trevor [name] MRN: [id number] Date of Birth: 23-Jan-1955 No data recorded  Encounter Date: 09/04/2018  PT End of Session - 09/04/18 1452    Visit Number  12    Number of Visits  91    Date for PT Re-Evaluation  11/22/18    Authorization Type  Eval 08/23/17; Last goals 07/30/18    PT Start Time  1430    PT Stop Time  1513    PT Time Calculation (min)  43 min    Equipment Utilized During Treatment  Gait belt   Rt BKA prosthesis   Activity Tolerance  Patient tolerated treatment well;Patient limited by fatigue    Behavior During Therapy  WFL for tasks assessed/performed       Past Medical History:  Diagnosis Date  . Diabetes mellitus without complication ([place])   . Heart murmur   . Hyperlipidemia   . Hypertension   . Sleep apnea     Past Surgical History:  Procedure Laterality Date  . AMPUTATION Right 03/31/2017   Procedure: RIGHT FOOT 1ST AND 2ND RAY AMPUTATION;  Surgeon: Newt Minion, MD;  Location: [place];  Service: Orthopedics;  Laterality: Right;  . AMPUTATION Right 04/01/2017   Procedure: AMPUTATION BELOW KNEE;  Surgeon: Newt Minion, MD;  Location: [place];  Service: Orthopedics;  Laterality: Right;  . COLONOSCOPY WITH PROPOFOL N/A 10/02/2017   Procedure: COLONOSCOPY WITH PROPOFOL;  Surgeon: [name], MD;  Location: [facility] ENDOSCOPY;  Service: Gastroenterology;  Laterality: N/A;  . CORNEAL TRANSPLANT      There were no vitals filed for this visit.  Subjective Assessment - 09/04/18 1432    Subjective  Patient reports its hot outside. Has been compliant with his HEP. No falls or LOB since last session.    Pertinent History  63 year old right-handed male with history of diabetes mellitus, hypertension, legally blind, and CKD presenting for BKA 04/01/17. Patient wears  contacts and can see better now.  Patient had two surgeries, one was a couple toes then they decided to go below the knee. 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 Has ambulated with RW in hospital with PT. Patient has had home health therapy and was in inpatient rehab about a month. Patient reports he was mainly sitting down, with occasional walking, last home health therapy in May. Patient reports some standing up but no exercises. Patient lives alone in the house and is scared they might fall. Patient received prosthetic leg Tuesday of last week.    Limitations  Standing;Walking;House hold activities;Other (comment)    How long can you stand comfortably?  10-15 minutes    How long can you walk comfortably?  10-15 minutes    Patient Stated Goals  going up steps and driving car.     Currently in Pain?  No/denies       144/72 pulse 63    Treatment:    Quantum leg press BLE 210 x15 2 sets  cueing for keeping slight bent in knee with decreasing velocity for increased muscle recruitment.    Quantum leg press single leg (one leg at a time) 105# x 15, 2 sets each LE ; cueing for keeping slight bent in knee with decreasing velocity for  increased muscle recruitment.   Gait mechanics: Ambulate >1000 ft with SPC with CGA, improved step length and weight acceptance onto R prosthetic limb. One seated break required due to fatigue.   modified squat with UE support and chair behind with UE's on outside of / bars 10x good weight acceptance onto prosthetic limb; cueing for widening BOS for optimal muscle recruitment.     seated arms crossed upright trunk position marches 10x each LE GTB adduction 15x each LE.  GTB abduction 12x with 3-5 second pulse at full position   Seated modified windmills 10x    Pt educated throughout session about proper posture and technique with exercises. Improved exercise technique, movement at target  joints, use of target muscles after min to mod verbal, visual, tactile cues.                          PT Education - 09/04/18 1452    Education Details  exercise technique, body mechanics    Person(s) Educated  Patient    Methods  Explanation;Demonstration;Tactile cues;Verbal cues    Comprehension  Verbalized understanding;Returned demonstration;Verbal cues required;Tactile cues required       PT Short Term Goals - 08/30/18 1701      PT SHORT TERM GOAL #1   Title  Patient will be independent in home exercise program to improve strength/mobility for better functional independence with ADLs.    Baseline  HEP given, 12/11/17: Performing daily:     Time  2    Period  Weeks    Status  Achieved      PT SHORT TERM GOAL #2   Title  Patient will don/doff prosthesis independently to allow for increased mobility in home.     Baseline  8/14: requires PT to guide/direct through process 8/28: independent    Time  2    Period  Weeks    Status  Achieved      PT SHORT TERM GOAL #3   Title  Patient will ambulate 10 meters with least assistive device and prosthesis to improve mobility in home.     Baseline  8/14: not walking outside of // bars yet  8/28: 63 seconds with RW and CGA     Time  2    Period  Weeks    Status  Achieved      PT SHORT TERM GOAL #4   Title  Patient will perform STS with single UE support to decrease reliance upon UE's for stability     Baseline  patient requires BUE support 11/5: 1 UR support    Time  2    Period  Weeks    Status  Achieved        PT Long Term Goals - 08/30/18 1701      PT LONG TERM GOAL #1   Title  Patient will ambulate >1088f during 675m with LRAD to demonstrate improved community ambulation and functional mobility    Baseline  11/5: will complete next session; 01/22/18: 400' in 3 minutes but had to stop secondary to pain in knee; 01/22/18: 400' in 3 minutes limited by R knee pain, 02/26/18: 500' in 3 minutes limited by low back  pain; 06/07/18: Deferred due to new prosthetic; 07/30/18: 800' with SPC (500' after 3 min)- 2 rest breaks, and ended 30 seconds early due to knee and back pain 8/20 : 600 ft in 3 minutes and 20 seconds; terminated after this due to back pain  Time  12    Period  Weeks    Status  Partially Met    Target Date  11/22/18      PT LONG TERM GOAL #2   Title  Patient (> 59 years old) will complete five times sit to stand test in < 15 seconds indicating an increased LE strength and improved balance.    Baseline  8/14: 61 seconds 8/28; 42 seconds 9/17: 24 seconds BUE support with LLE 10/10: 17 seconds with excessive BUE support  11/5: 18seconds BUE support; 12/11/17: 12/11/17: 18.5s with BUE support but no AD, 01/22/18: 17.1s BUE support but no AD; 02/26/18: 13.0s with BUE, still unable to perform without UE assistance; 06/07/18: 25.4s with single UE support during first rep only; 07/30/18: 24.3s with single UE support during 1st rep only 8/20: 10 seconds BUE support, 16 seconds SUE support    Time  12    Period  Weeks    Status  Partially Met    Target Date  11/22/18      PT LONG TERM GOAL #3   Title  Patient will reduce timed up and go to <11 seconds to reduce fall risk and demonstrate improved transfer/gait ability.    Baseline  8/14: unable to perform 8/28: 1 min 30 seconds 9/17: 38 seconds RW 10/10: 25 seconds with RW 11/5: 20 seconds with cane;, 12/11/17: 17.4s with spc; 01/22/18: 14.8s with spc; 02/26/18: 13.0s with spc; 06/07/18: 16.1s with RW; 07/30/18: 12.44s with SPC 8/20: 11 seconds with SPC    Time  12    Period  Weeks    Status  Achieved      PT LONG TERM GOAL #4   Title  Patient will increase lower extremity functional scale to >60/80 to demonstrate improved functional mobility and increased tolerance with ADLs.     Baseline  814: 29/80 8/28: 20/80 10/10: 20/80 11/5: 22/80; 12/11/17: 18/80; 01/22/18: 27/80; 06/07/18: 30/80; 07/30/18: 35/80 8/20: 36/80    Time  12    Period  Weeks    Status  Partially  Met    Target Date  11/22/18      PT LONG TERM GOAL #5   Title  Patient will perform 10 MWT in >1.0 m/s for improved mobility and safety with negotiating natural environment    Baseline  9/17: .53 m/s 10/10: .63 m/s with RW 11/5: 0.43ms; 12/11/17: 15.33s = 0.65 m/s with spc; 01/22/18: self-selected: 14.4s = 0.69 m/s, fastest: 11.8s = 0.85 m/s; 02/26/18: self-selected: 13.3s = 0.75 m/s, fastest: 10.6s = 0.94 m/s; 06/07/18: self-selected: 14.0s = 0.71 m/s, fastest: 11.7s = 0.85 m/s; 07/30/18: self selected: 12.45s = 0.854m, fastest:8.84s = 1.1346m8/20: 1.1 m/s with SPC    Time  12    Period  Weeks    Status  Achieved      PT LONG TERM GOAL #6   Title  Patient will be mod I for negotiating curb with LRAD while wearing prosthetic for safe community ambulation.    Baseline  8/20: patient not able to negotiate curbs    Time  12    Period  Weeks    Status  New    Target Date  11/22/18            Plan - 09/04/18 1457    Clinical Impression Statement  Patient presents to physical therapy with good motivation. Is progressing with gait mechanics with prolonged ambulation however continues to require a seated rest break.  Patient is strengthening his right  residual limb strength with increased repetitions of strengthening interventions. He will continue to benefit from skilled physical therapy to improve functional mobility, improve prosthetic use and increase ambulation to increase LOF and improve overall QOL.    Examination-Participation Restrictions  Interpersonal Relationship    Rehab Potential  Fair    Clinical Impairments Affecting Rehab Potential  (+) previous independence, motivation to return to walking (-) lives alone, hx of diabetes, limited vision     PT Frequency  2x / week    PT Duration  8 weeks    PT Treatment/Interventions  ADLs/Self Care Home Management;Cryotherapy;Electrical Stimulation;Ultrasound;Traction;Moist Heat;DME Instruction;Gait training;Stair training;Functional  mobility training;Therapeutic activities;Therapeutic exercise;Patient/family education;Neuromuscular re-education;Balance training;Prosthetic Training;Wheelchair mobility training;Manual techniques;Manual lymph drainage;Compression bandaging;Taping;Energy conservation;Passive range of motion    PT Next Visit Plan  progression of SLS on prosthetic limb,    PT Home Exercise Plan  see [id number] on medbridge, spc ambulation    Consulted and Agree with Plan of Care  Patient       Patient will benefit from skilled therapeutic intervention in order to improve the following deficits and impairments:  Abnormal gait, Decreased activity tolerance, Decreased balance, Decreased knowledge of precautions, Decreased endurance, Decreased knowledge of use of DME, Decreased mobility, Decreased range of motion, Difficulty walking, Decreased strength, Increased edema, Impaired flexibility, Impaired perceived functional ability, Prosthetic Dependency, Postural dysfunction, Improper body mechanics, Pain  Visit Diagnosis: Muscle weakness (generalized)  Unsteadiness on feet  Other abnormalities of gait and mobility  Unilateral complete BKA, right, sequela ([place])     Problem List Patient Active Problem List   Diagnosis Date Noted  . Pain due to onychomycosis of toenail of right foot 07/05/2018  . Abnormality of gait 10/12/2017  . Poorly controlled type 2 diabetes mellitus with peripheral neuropathy ([place])   . Flatulence   . Hypomagnesemia   . Unilateral complete BKA, right, sequela ([place])   . Benign essential HTN   . Hypoalbuminemia due to protein-calorie malnutrition ([place])   . S/P below knee amputation, right ([place]) 04/12/2017  . Acute blood loss anemia   . Other encephalopathy 04/08/2017  . Encephalopathy 04/08/2017  . Altered mental status 04/08/2017  . Labile blood pressure   . Labile blood glucose   . Drug induced constipation   . Stage 3 chronic kidney disease ([place])   . Bacteremia   . S/P unilateral  BKA (below knee amputation), right ([place]) 04/04/2017  . PAD (peripheral artery disease) ([place])   . Type 2 diabetes mellitus with right diabetic foot ulcer ([place])   . Post-operative pain   . Legally blind   . Upper GI bleed   . Streptococcal bacteremia 04/01/2017  . Hypokalemia 03/30/2017  . Uncontrolled type 2 diabetes mellitus with hyperglycemia, with long-term current use of insulin ([place]) 03/30/2017  . Type 2 diabetes mellitus with peripheral neuropathy ([place]) 03/30/2017  . AKI (acute kidney injury) ([place]) 03/30/2017  . CKD stage 3 due to type 2 diabetes mellitus ([place]) 03/30/2017  . Sepsis ([place]) 03/30/2017  . Heart murmur 11/22/2016  . Hyperlipidemia associated with type 2 diabetes mellitus ([place]) 11/22/2016  . Obstructive sleep apnea syndrome 11/22/2016  . Essential hypertension 12/17/2012   Janna Arch, PT, DPT   09/04/2018, 3:15 PM  [place] MAIN [facility] SERVICES [address] [place], [place], [address] Phone: [phone]   Fax:  [phone]  Name: Trevor [name] MRN: [id number] Date of Birth: July 11, 1955

## 2018-09-06 ENCOUNTER — Other Ambulatory Visit: Payer: Self-pay

## 2018-09-06 ENCOUNTER — Ambulatory Visit: Payer: Medicare Other

## 2018-09-06 DIAGNOSIS — R2689 Other abnormalities of gait and mobility: Secondary | ICD-10-CM

## 2018-09-06 DIAGNOSIS — M6281 Muscle weakness (generalized): Secondary | ICD-10-CM | POA: Diagnosis not present

## 2018-09-06 DIAGNOSIS — R2681 Unsteadiness on feet: Secondary | ICD-10-CM

## 2018-09-06 NOTE — Therapy (Signed)
[place] MAIN [facility] SERVICES [address] [place], [place], [address] Phone: [phone]   Fax:  [phone]  Physical Therapy Treatment  Patient Details  Name: Trevor [name] MRN: [id number] Date of Birth: August 14, 1955 No data recorded  Encounter Date: 09/06/2018  PT End of Session - 09/06/18 1503    Visit Number  36    Number of Visits  91    Date for PT Re-Evaluation  11/22/18    Authorization Type  Eval 08/23/17; Last goals 07/30/18    PT Start Time  1430    PT Stop Time  1510    PT Time Calculation (min)  40 min    Equipment Utilized During Treatment  Gait belt   Rt BKA prosthesis   Activity Tolerance  Patient tolerated treatment well;Patient limited by fatigue    Behavior During Therapy  WFL for tasks assessed/performed       Past Medical History:  Diagnosis Date  . Diabetes mellitus without complication ([place])   . Heart murmur   . Hyperlipidemia   . Hypertension   . Sleep apnea     Past Surgical History:  Procedure Laterality Date  . AMPUTATION Right 03/31/2017   Procedure: RIGHT FOOT 1ST AND 2ND RAY AMPUTATION;  Surgeon: Newt Minion, MD;  Location: [place];  Service: Orthopedics;  Laterality: Right;  . AMPUTATION Right 04/01/2017   Procedure: AMPUTATION BELOW KNEE;  Surgeon: Newt Minion, MD;  Location: [place];  Service: Orthopedics;  Laterality: Right;  . COLONOSCOPY WITH PROPOFOL N/A 10/02/2017   Procedure: COLONOSCOPY WITH PROPOFOL;  Surgeon: [name], MD;  Location: [facility] ENDOSCOPY;  Service: Gastroenterology;  Laterality: N/A;  . CORNEAL TRANSPLANT      There were no vitals filed for this visit.  Subjective Assessment - 09/06/18 1501    Subjective  Patient is more tired today, reports he hasn't slept well the night before.    Pertinent History  63 year old right-handed male with history of diabetes mellitus, hypertension, legally blind, and CKD presenting for BKA 04/01/17. Patient wears contacts and can see better  now.  Patient had two surgeries, one was a couple toes then they decided to go below the knee. 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 Has ambulated with RW in hospital with PT. Patient has had home health therapy and was in inpatient rehab about a month. Patient reports he was mainly sitting down, with occasional walking, last home health therapy in May. Patient reports some standing up but no exercises. Patient lives alone in the house and is scared they might fall. Patient received prosthetic leg Tuesday of last week.    Limitations  Standing;Walking;House hold activities;Other (comment)    How long can you stand comfortably?  10-15 minutes    How long can you walk comfortably?  10-15 minutes    Patient Stated Goals  going up steps and driving car.     Currently in Pain?  No/denies       146/69 pulse 64   Treatment:     Ambulate >800 ft with SPC with CGA, improved step length and weight acceptance onto R prosthetic limb. One seated break required due to fatigue.   Cones: Figure 8 around two closely placed cones for small pivot area  Side step over six cones -forward, backward diagonal zig zag 4x length of 6 cones -weaving with lateral side step; 4x  length of 6 cones.   Seated TrA activation squishing swiss ball between arms and knees. 10x 3 second holds   Seated GTB rows 15x upright posture    seated arms crossed upright trunk position marches 10x each LE  Seated ER/IR 10x each LE  GTB marches with alternating UE raises/swings x15 GTB adduction 15x each LE.  GTB abduction 20x with 3-5 second pulse at full position    Seated modified windmills 10x    Pt educated throughout session about proper posture and technique with exercises. Improved exercise technique, movement at target joints, use of target muscles after min to mod verbal, visual, tactile cues.                        PT  Education - 09/06/18 1502    Education Details  exercise technique, body mechanics    Person(s) Educated  Patient    Methods  Explanation;Demonstration;Tactile cues;Verbal cues    Comprehension  Verbalized understanding;Returned demonstration;Verbal cues required;Tactile cues required       PT Short Term Goals - 08/30/18 1701      PT SHORT TERM GOAL #1   Title  Patient will be independent in home exercise program to improve strength/mobility for better functional independence with ADLs.    Baseline  HEP given, 12/11/17: Performing daily:     Time  2    Period  Weeks    Status  Achieved      PT SHORT TERM GOAL #2   Title  Patient will don/doff prosthesis independently to allow for increased mobility in home.     Baseline  8/14: requires PT to guide/direct through process 8/28: independent    Time  2    Period  Weeks    Status  Achieved      PT SHORT TERM GOAL #3   Title  Patient will ambulate 10 meters with least assistive device and prosthesis to improve mobility in home.     Baseline  8/14: not walking outside of // bars yet  8/28: 63 seconds with RW and CGA     Time  2    Period  Weeks    Status  Achieved      PT SHORT TERM GOAL #4   Title  Patient will perform STS with single UE support to decrease reliance upon UE's for stability     Baseline  patient requires BUE support 11/5: 1 UR support    Time  2    Period  Weeks    Status  Achieved        PT Long Term Goals - 08/30/18 1701      PT LONG TERM GOAL #1   Title  Patient will ambulate >1025f during 661m with LRAD to demonstrate improved community ambulation and functional mobility    Baseline  11/5: will complete next session; 01/22/18: 400' in 3 minutes but had to stop secondary to pain in knee; 01/22/18: 400' in 3 minutes limited by R knee pain, 02/26/18: 500' in 3 minutes limited by low back pain; 06/07/18: Deferred due to new prosthetic; 07/30/18: 800' with SPC (500' after 3 min)- 2 rest breaks, and ended 30 seconds  early due to knee and back pain 8/20 : 600 ft in 3 minutes and 20 seconds; terminated after this due to back pain    Time  12    Period  Weeks    Status  Partially Met    Target Date  11/22/18      PT LONG TERM GOAL #2   Title  Patient (> 62 years old) will complete five times sit to stand test in < 15 seconds indicating an increased LE strength and improved balance.    Baseline  8/14: 61 seconds 8/28; 42 seconds 9/17: 24 seconds BUE support with LLE 10/10: 17 seconds with excessive BUE support  11/5: 18seconds BUE support; 12/11/17: 12/11/17: 18.5s with BUE support but no AD, 01/22/18: 17.1s BUE support but no AD; 02/26/18: 13.0s with BUE, still unable to perform without UE assistance; 06/07/18: 25.4s with single UE support during first rep only; 07/30/18: 24.3s with single UE support during 1st rep only 8/20: 10 seconds BUE support, 16 seconds SUE support    Time  12    Period  Weeks    Status  Partially Met    Target Date  11/22/18      PT LONG TERM GOAL #3   Title  Patient will reduce timed up and go to <11 seconds to reduce fall risk and demonstrate improved transfer/gait ability.    Baseline  8/14: unable to perform 8/28: 1 min 30 seconds 9/17: 38 seconds RW 10/10: 25 seconds with RW 11/5: 20 seconds with cane;, 12/11/17: 17.4s with spc; 01/22/18: 14.8s with spc; 02/26/18: 13.0s with spc; 06/07/18: 16.1s with RW; 07/30/18: 12.44s with SPC 8/20: 11 seconds with SPC    Time  12    Period  Weeks    Status  Achieved      PT LONG TERM GOAL #4   Title  Patient will increase lower extremity functional scale to >60/80 to demonstrate improved functional mobility and increased tolerance with ADLs.     Baseline  814: 29/80 8/28: 20/80 10/10: 20/80 11/5: 22/80; 12/11/17: 18/80; 01/22/18: 27/80; 06/07/18: 30/80; 07/30/18: 35/80 8/20: 36/80    Time  12    Period  Weeks    Status  Partially Met    Target Date  11/22/18      PT LONG TERM GOAL #5   Title  Patient will perform 10 MWT in >1.0 m/s for improved  mobility and safety with negotiating natural environment    Baseline  9/17: .53 m/s 10/10: .63 m/s with RW 11/5: 0.45ms; 12/11/17: 15.33s = 0.65 m/s with spc; 01/22/18: self-selected: 14.4s = 0.69 m/s, fastest: 11.8s = 0.85 m/s; 02/26/18: self-selected: 13.3s = 0.75 m/s, fastest: 10.6s = 0.94 m/s; 06/07/18: self-selected: 14.0s = 0.71 m/s, fastest: 11.7s = 0.85 m/s; 07/30/18: self selected: 12.45s = 0.826m, fastest:8.84s = 1.1322m8/20: 1.1 m/s with SPC    Time  12    Period  Weeks    Status  Achieved      PT LONG TERM GOAL #6   Title  Patient will be mod I for negotiating curb with LRAD while wearing prosthetic for safe community ambulation.    Baseline  8/20: patient not able to negotiate curbs    Time  12    Period  Weeks    Status  New    Target Date  11/22/18            Plan - 09/06/18 1504    Clinical Impression Statement  Patient fatigued very quickly today requiring more frequent rest breaks than previous sessions. Is improving in ability to negotiate obstacles and pivot on prosthetic limb. Core and postural interventions added to session for stability. He will continue to benefit from skilled physical therapy to improve functional mobility, improve prosthetic use and increase  ambulation to increase LOF and improve overall QOL    Examination-Participation Restrictions  Interpersonal Relationship    Rehab Potential  Fair    Clinical Impairments Affecting Rehab Potential  (+) previous independence, motivation to return to walking (-) lives alone, hx of diabetes, limited vision     PT Frequency  2x / week    PT Duration  8 weeks    PT Treatment/Interventions  ADLs/Self Care Home Management;Cryotherapy;Electrical Stimulation;Ultrasound;Traction;Moist Heat;DME Instruction;Gait training;Stair training;Functional mobility training;Therapeutic activities;Therapeutic exercise;Patient/family education;Neuromuscular re-education;Balance training;Prosthetic Training;Wheelchair mobility  training;Manual techniques;Manual lymph drainage;Compression bandaging;Taping;Energy conservation;Passive range of motion    PT Next Visit Plan  progression of SLS on prosthetic limb,    PT Home Exercise Plan  see [id number] on medbridge, spc ambulation    Consulted and Agree with Plan of Care  Patient       Patient will benefit from skilled therapeutic intervention in order to improve the following deficits and impairments:  Abnormal gait, Decreased activity tolerance, Decreased balance, Decreased knowledge of precautions, Decreased endurance, Decreased knowledge of use of DME, Decreased mobility, Decreased range of motion, Difficulty walking, Decreased strength, Increased edema, Impaired flexibility, Impaired perceived functional ability, Prosthetic Dependency, Postural dysfunction, Improper body mechanics, Pain  Visit Diagnosis: Muscle weakness (generalized)  Unsteadiness on feet  Other abnormalities of gait and mobility     Problem List Patient Active Problem List   Diagnosis Date Noted  . Pain due to onychomycosis of toenail of right foot 07/05/2018  . Abnormality of gait 10/12/2017  . Poorly controlled type 2 diabetes mellitus with peripheral neuropathy ([place])   . Flatulence   . Hypomagnesemia   . Unilateral complete BKA, right, sequela ([place])   . Benign essential HTN   . Hypoalbuminemia due to protein-calorie malnutrition ([place])   . S/P below knee amputation, right ([place]) 04/12/2017  . Acute blood loss anemia   . Other encephalopathy 04/08/2017  . Encephalopathy 04/08/2017  . Altered mental status 04/08/2017  . Labile blood pressure   . Labile blood glucose   . Drug induced constipation   . Stage 3 chronic kidney disease ([place])   . Bacteremia   . S/P unilateral BKA (below knee amputation), right ([place]) 04/04/2017  . PAD (peripheral artery disease) ([place])   . Type 2 diabetes mellitus with right diabetic foot ulcer ([place])   . Post-operative pain   . Legally blind   . Upper GI  bleed   . Streptococcal bacteremia 04/01/2017  . Hypokalemia 03/30/2017  . Uncontrolled type 2 diabetes mellitus with hyperglycemia, with long-term current use of insulin ([place]) 03/30/2017  . Type 2 diabetes mellitus with peripheral neuropathy ([place]) 03/30/2017  . AKI (acute kidney injury) ([place]) 03/30/2017  . CKD stage 3 due to type 2 diabetes mellitus ([place]) 03/30/2017  . Sepsis ([place]) 03/30/2017  . Heart murmur 11/22/2016  . Hyperlipidemia associated with type 2 diabetes mellitus ([place]) 11/22/2016  . Obstructive sleep apnea syndrome 11/22/2016  . Essential hypertension 12/17/2012   Janna Arch, PT, DPT   09/06/2018, 3:15 PM  [place] MAIN [facility] SERVICES [address] [place], [place], [address] Phone: [phone]   Fax:  [phone]  Name: SALMAN WELLEN MRN: [id number] Date of Birth: [birth year]

## 2018-09-11 ENCOUNTER — Ambulatory Visit: Payer: Medicare Other | Attending: Physical Medicine & Rehabilitation

## 2018-09-11 ENCOUNTER — Other Ambulatory Visit: Payer: Self-pay

## 2018-09-11 VITALS — BP 146/84 | HR 68

## 2018-09-11 DIAGNOSIS — R2681 Unsteadiness on feet: Secondary | ICD-10-CM | POA: Insufficient documentation

## 2018-09-11 DIAGNOSIS — R2689 Other abnormalities of gait and mobility: Secondary | ICD-10-CM

## 2018-09-11 DIAGNOSIS — M6281 Muscle weakness (generalized): Secondary | ICD-10-CM | POA: Insufficient documentation

## 2018-09-11 DIAGNOSIS — S88111S Complete traumatic amputation at level between knee and ankle, right lower leg, sequela: Secondary | ICD-10-CM | POA: Insufficient documentation

## 2018-09-11 NOTE — Therapy (Signed)
[place] [facility] SERVICES [address] [place], [place], [address] Phone: [phone]   Fax:  [phone]  Physical Therapy Progress Note   Dates of reporting period  08/01/18   to   09/11/18  Patient Details  Name: Trevor [name] MRN: [id number] Date of Birth: 1955/07/06 No data recorded  Encounter Date: 09/11/2018  PT End of Session - 09/11/18 1547    Visit Number  70    Number of Visits  91    Date for PT Re-Evaluation  11/22/18    Authorization Type  Eval 08/23/17; Last goals 08/30/18    PT Start Time  1525    PT Stop Time  1600    PT Time Calculation (min)  35 min    Equipment Utilized During Treatment  Gait belt   Rt BKA prosthesis   Activity Tolerance  Patient tolerated treatment well;Patient limited by fatigue    Behavior During Therapy  WFL for tasks assessed/performed       Past Medical History:  Diagnosis Date  . Diabetes mellitus without complication ([place])   . Heart murmur   . Hyperlipidemia   . Hypertension   . Sleep apnea     Past Surgical History:  Procedure Laterality Date  . AMPUTATION Right 03/31/2017   Procedure: RIGHT FOOT 1ST AND 2ND RAY AMPUTATION;  Surgeon: Newt Minion, MD;  Location: [facility];  Service: Orthopedics;  Laterality: Right;  . AMPUTATION Right 04/01/2017   Procedure: AMPUTATION BELOW KNEE;  Surgeon: Newt Minion, MD;  Location: [place];  Service: Orthopedics;  Laterality: Right;  . COLONOSCOPY WITH PROPOFOL N/A 10/02/2017   Procedure: COLONOSCOPY WITH PROPOFOL;  Surgeon: [name], MD;  Location: [facility] ENDOSCOPY;  Service: Gastroenterology;  Laterality: N/A;  . CORNEAL TRANSPLANT      Vitals:   09/11/18 1526  BP: (!) 146/84  Pulse: 68  SpO2: 99%    Subjective Assessment - 09/11/18 1526    Subjective  Pt reports that he is doing well today. He complains of 3/10 R eye pain which has been starting in the morning after putting in his contact lens. Denies any vision changes or discharge. He  is scheduled to see his optomotrist soon. No specific questions upon arrival.    Pertinent History  63 year old right-handed male with history of diabetes mellitus, hypertension, legally blind, and CKD presenting for BKA 04/01/17. Patient wears contacts and can see better now.  Patient had two surgeries, one was a couple toes then they decided to go below the knee. 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 Has ambulated with RW in hospital with PT. Patient has had home health therapy and was in inpatient rehab about a month. Patient reports he was mainly sitting down, with occasional walking, last home health therapy in May. Patient reports some standing up but no exercises. Patient lives alone in the house and is scared they might fall. Patient received prosthetic leg Tuesday of last week.    Limitations  Standing;Walking;House hold activities;Other (comment)    How long can you stand comfortably?  10-15 minutes    How long can you walk comfortably?  10-15 minutes    Patient Stated Goals  going up steps and driving car.     Currently in Pain?  Yes    Pain Score  3     Pain Location  Eye  Pain Orientation  Left    Pain Descriptors / Indicators  Aching    Pain Type  Acute pain    Pain Onset  In the past 7 days    Pain Frequency  Intermittent            TREATMENT   Gait Training Gait trainingin [id number]' withsingle point caneprogressing to noassistive devicefor approximately half of the ambulation distance. Pt reports no back pain today but complains of increase in pain in RLE residual extremity at the end of ambulation distance. One seated rest break required. Performed horizontal/verticalhead turns,gait speed changes, and quick stops..    Ther-ex  Seated hip flexion marches x 15; Seated manually resisted clams x 15; Seated manually resisted adductor squeeze x 15; Seated manually resisted R LAQ x 15   Seated GTB R HS curls x 15; Seated GTB rows 15x upright posture ; Standing GTB hip abduction x 15 bilateral; Standing GTB hip extension x 15 bilateral;   Pt educated throughout session about proper posture and technique with exercises. Improved exercise technique, movement at target joints, use of target muscles after min to mod verbal, visual, tactile cues.   Pt is making excellent progress with physical therapy. He has been able to increase his 6MWT distance although occasionally the test is limited by low back pain. Five Time Sit to Stand and TUG are both improving demonstrating improved LE strength, balance, and gait. Pt has been able to progressively increase his total ambulation distance between sessions but remains less compliant wearing his prosthesis at home due to reports of excessive sweating. Pt demonstrates good motivation during session today. Encouraged pt to continue HEP and follow-up as scheduled. Pt will benefit from PT services to address deficits in strength, balance, and mobility in order to return to full function at home.                    PT Short Term Goals - 08/30/18 1701      PT SHORT TERM GOAL #1   Title  Patient will be independent in home exercise program to improve strength/mobility for better functional independence with ADLs.    Baseline  HEP given, 12/11/17: Performing daily:     Time  2    Period  Weeks    Status  Achieved      PT SHORT TERM GOAL #2   Title  Patient will don/doff prosthesis independently to allow for increased mobility in home.     Baseline  8/14: requires PT to guide/direct through process 8/28: independent    Time  2    Period  Weeks    Status  Achieved      PT SHORT TERM GOAL #3   Title  Patient will ambulate 10 meters with least assistive device and prosthesis to improve mobility in home.     Baseline  8/14: not walking outside of // bars yet  8/28: 63 seconds with RW and CGA     Time  2    Period  Weeks     Status  Achieved      PT SHORT TERM GOAL #4   Title  Patient will perform STS with single UE support to decrease reliance upon UE's for stability     Baseline  patient requires BUE support 11/5: 1 UR support    Time  2    Period  Weeks    Status  Achieved        PT Long Term Goals -  08/30/18 1701      PT LONG TERM GOAL #1   Title  Patient will ambulate >1060f during 632m with LRAD to demonstrate improved community ambulation and functional mobility    Baseline  11/5: will complete next session; 01/22/18: 400' in 3 minutes but had to stop secondary to pain in knee; 01/22/18: 400' in 3 minutes limited by R knee pain, 02/26/18: 500' in 3 minutes limited by low back pain; 06/07/18: Deferred due to new prosthetic; 07/30/18: 800' with SPC (500' after 3 min)- 2 rest breaks, and ended 30 seconds early due to knee and back pain 8/20 : 600 ft in 3 minutes and 20 seconds; terminated after this due to back pain    Time  12    Period  Weeks    Status  Partially Met    Target Date  11/22/18      PT LONG TERM GOAL #2   Title  Patient (> 6023ears old) will complete five times sit to stand test in < 15 seconds indicating an increased LE strength and improved balance.    Baseline  8/14: 61 seconds 8/28; 42 seconds 9/17: 24 seconds BUE support with LLE 10/10: 17 seconds with excessive BUE support  11/5: 18seconds BUE support; 12/11/17: 12/11/17: 18.5s with BUE support but no AD, 01/22/18: 17.1s BUE support but no AD; 02/26/18: 13.0s with BUE, still unable to perform without UE assistance; 06/07/18: 25.4s with single UE support during first rep only; 07/30/18: 24.3s with single UE support during 1st rep only 8/20: 10 seconds BUE support, 16 seconds SUE support    Time  12    Period  Weeks    Status  Partially Met    Target Date  11/22/18      PT LONG TERM GOAL #3   Title  Patient will reduce timed up and go to <11 seconds to reduce fall risk and demonstrate improved transfer/gait ability.    Baseline  8/14: unable  to perform 8/28: 1 min 30 seconds 9/17: 38 seconds RW 10/10: 25 seconds with RW 11/5: 20 seconds with cane;, 12/11/17: 17.4s with spc; 01/22/18: 14.8s with spc; 02/26/18: 13.0s with spc; 06/07/18: 16.1s with RW; 07/30/18: 12.44s with SPC 8/20: 11 seconds with SPC    Time  12    Period  Weeks    Status  Achieved      PT LONG TERM GOAL #4   Title  Patient will increase lower extremity functional scale to >60/80 to demonstrate improved functional mobility and increased tolerance with ADLs.     Baseline  814: 29/80 8/28: 20/80 10/10: 20/80 11/5: 22/80; 12/11/17: 18/80; 01/22/18: 27/80; 06/07/18: 30/80; 07/30/18: 35/80 8/20: 36/80    Time  12    Period  Weeks    Status  Partially Met    Target Date  11/22/18      PT LONG TERM GOAL #5   Title  Patient will perform 10 MWT in >1.0 m/s for improved mobility and safety with negotiating natural environment    Baseline  9/17: .53 m/s 10/10: .63 m/s with RW 11/5: 0.6691m 12/11/17: 15.33s = 0.65 m/s with spc; 01/22/18: self-selected: 14.4s = 0.69 m/s, fastest: 11.8s = 0.85 m/s; 02/26/18: self-selected: 13.3s = 0.75 m/s, fastest: 10.6s = 0.94 m/s; 06/07/18: self-selected: 14.0s = 0.71 m/s, fastest: 11.7s = 0.85 m/s; 07/30/18: self selected: 12.45s = 0.48m58mfastest:8.84s = 1.30m/45m20: 1.1 m/s with SPC    Time  12    Period  Weeks  Status  Achieved      PT LONG TERM GOAL #6   Title  Patient will be mod I for negotiating curb with LRAD while wearing prosthetic for safe community ambulation.    Baseline  8/20: patient not able to negotiate curbs    Time  12    Period  Weeks    Status  New    Target Date  11/22/18            Plan - 09/11/18 1547    Examination-Participation Restrictions  Interpersonal Relationship    Rehab Potential  Fair    Clinical Impairments Affecting Rehab Potential  (+) previous independence, motivation to return to walking (-) lives alone, hx of diabetes, limited vision     PT Frequency  2x / week    PT Duration  8 weeks    PT  Treatment/Interventions  ADLs/Self Care Home Management;Cryotherapy;Electrical Stimulation;Ultrasound;Traction;Moist Heat;DME Instruction;Gait training;Stair training;Functional mobility training;Therapeutic activities;Therapeutic exercise;Patient/family education;Neuromuscular re-education;Balance training;Prosthetic Training;Wheelchair mobility training;Manual techniques;Manual lymph drainage;Compression bandaging;Taping;Energy conservation;Passive range of motion    PT Next Visit Plan  progression of SLS on prosthetic limb,    PT Home Exercise Plan  see [id number] on medbridge, spc ambulation    Consulted and Agree with Plan of Care  Patient       Patient will benefit from skilled therapeutic intervention in order to improve the following deficits and impairments:  Abnormal gait, Decreased activity tolerance, Decreased balance, Decreased knowledge of precautions, Decreased endurance, Decreased knowledge of use of DME, Decreased mobility, Decreased range of motion, Difficulty walking, Decreased strength, Increased edema, Impaired flexibility, Impaired perceived functional ability, Prosthetic Dependency, Postural dysfunction, Improper body mechanics, Pain  Visit Diagnosis: Unsteadiness on feet  Other abnormalities of gait and mobility     Problem List Patient Active Problem List   Diagnosis Date Noted  . Pain due to onychomycosis of toenail of right foot 07/05/2018  . Abnormality of gait 10/12/2017  . Poorly controlled type 2 diabetes mellitus with peripheral neuropathy ([place])   . Flatulence   . Hypomagnesemia   . Unilateral complete BKA, right, sequela ([place])   . Benign essential HTN   . Hypoalbuminemia due to protein-calorie malnutrition ([place])   . S/P below knee amputation, right ([place]) 04/12/2017  . Acute blood loss anemia   . Other encephalopathy 04/08/2017  . Encephalopathy 04/08/2017  . Altered mental status 04/08/2017  . Labile blood pressure   . Labile blood glucose   . Drug  induced constipation   . Stage 3 chronic kidney disease ([place])   . Bacteremia   . S/P unilateral BKA (below knee amputation), right ([place]) 04/04/2017  . PAD (peripheral artery disease) ([place])   . Type 2 diabetes mellitus with right diabetic foot ulcer ([place])   . Post-operative pain   . Legally blind   . Upper GI bleed   . Streptococcal bacteremia 04/01/2017  . Hypokalemia 03/30/2017  . Uncontrolled type 2 diabetes mellitus with hyperglycemia, with long-term current use of insulin ([place]) 03/30/2017  . Type 2 diabetes mellitus with peripheral neuropathy ([place]) 03/30/2017  . AKI (acute kidney injury) ([place]) 03/30/2017  . CKD stage 3 due to type 2 diabetes mellitus ([place]) 03/30/2017  . Sepsis ([place]) 03/30/2017  . Heart murmur 11/22/2016  . Hyperlipidemia associated with type 2 diabetes mellitus ([place]) 11/22/2016  . Obstructive sleep apnea syndrome 11/22/2016  . Essential hypertension 12/17/2012   Lyndel Safe Huprich PT, DPT, GCS  Huprich,Jason 09/12/2018, 11:19 AM  [facility] [facility]  [place], [place], [address] Phone: [phone]   Fax:  [phone]  Name: Trevor [name] MRN: [id number] Date of Birth: 12-Jul-1955

## 2018-09-13 ENCOUNTER — Ambulatory Visit (INDEPENDENT_AMBULATORY_CARE_PROVIDER_SITE_OTHER): Payer: Medicare Other | Admitting: Podiatry

## 2018-09-13 ENCOUNTER — Other Ambulatory Visit: Payer: Self-pay

## 2018-09-13 ENCOUNTER — Encounter: Payer: Self-pay | Admitting: Podiatry

## 2018-09-13 DIAGNOSIS — M79675 Pain in left toe(s): Secondary | ICD-10-CM

## 2018-09-13 DIAGNOSIS — B351 Tinea unguium: Secondary | ICD-10-CM | POA: Diagnosis not present

## 2018-09-13 DIAGNOSIS — S88119D Complete traumatic amputation at level between knee and ankle, unspecified lower leg, subsequent encounter: Secondary | ICD-10-CM

## 2018-09-13 DIAGNOSIS — E1142 Type 2 diabetes mellitus with diabetic polyneuropathy: Secondary | ICD-10-CM

## 2018-09-13 NOTE — Progress Notes (Signed)
Complaint:  Visit Type: Patient returns to my office for continued preventative foot care services. Complaint: Patient states" my nails have grown long and thick and become painful to walk and wear shoes" Patient has been diagnosed with DM with neuropathy left foot.  Patient has amputation right leg.. The patient presents for preventative foot care services. No changes to ROS.  Patient has diabetic neuropathy and angiopathy.  Podiatric Exam: Vascular: dorsalis pedis and posterior tibial pulses are not  palpable  left foot. Capillary return is immediate. Temperature gradient is WNL. Skin turgor WNL  Sensorium: Diminished  Semmes Weinstein monofilament test. Diminished  tactile sensation bilaterally. Nail Exam: Pt has thick disfigured discolored nails with subungual debris noted left  entire nail hallux through fifth toenails Ulcer Exam: There is no evidence of ulcer or pre-ulcerative changes or infection. Orthopedic Exam: Muscle tone and strength are WNL. No limitations in general ROM. No crepitus or effusions noted. Foot type and digits show no abnormalities. Bony prominences are unremarkable. BK amputation right. Skin: No Porokeratosis. No infection or ulcers.  Crust with fizzures medial plantar aspect left foot.  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  Diagnosis:  Onychomycosis, , Pain in right toe, pain in left toes  Treatment & Plan Procedures and Treatment: Consent by patient was obtained for treatment procedures.   Debridement of mycotic and hypertrophic toenails, 1 through 5 bilateral and clearing of subungual debris. No ulceration, no infection noted. Patient says his crusts on left foot have improved since his last visit.  These crusty lesions are not improving.   Return Visit-Office Procedure: Patient instructed to return to the office for a follow up visit 10 weeks for continued evaluation and treatment.    Gardiner Barefoot DPM

## 2018-09-18 ENCOUNTER — Ambulatory Visit: Payer: Medicare Other

## 2018-09-18 ENCOUNTER — Other Ambulatory Visit: Payer: Self-pay

## 2018-09-18 DIAGNOSIS — R2681 Unsteadiness on feet: Secondary | ICD-10-CM | POA: Diagnosis not present

## 2018-09-18 DIAGNOSIS — M6281 Muscle weakness (generalized): Secondary | ICD-10-CM

## 2018-09-18 DIAGNOSIS — R2689 Other abnormalities of gait and mobility: Secondary | ICD-10-CM

## 2018-09-18 NOTE — Therapy (Signed)
[place] MAIN [facility] SERVICES [address] [place], [place], [address] Phone: [phone]   Fax:  [phone]  Physical Therapy Treatment  Patient Details  Name: Trevor [name] MRN: [id number] Date of Birth: September 04, 1955 No data recorded  Encounter Date: 09/18/2018  PT End of Session - 09/18/18 1440    Visit Number  71    Number of Visits  91    Date for PT Re-Evaluation  11/22/18    Authorization Type  Eval 08/23/17; Last goals 08/30/18    PT Start Time  1415    PT Stop Time  1459    PT Time Calculation (min)  44 min    Equipment Utilized During Treatment  Gait belt   Rt BKA prosthesis   Activity Tolerance  Patient tolerated treatment well;Patient limited by fatigue    Behavior During Therapy  WFL for tasks assessed/performed       Past Medical History:  Diagnosis Date  . Diabetes mellitus without complication ([place])   . Heart murmur   . Hyperlipidemia   . Hypertension   . Sleep apnea     Past Surgical History:  Procedure Laterality Date  . AMPUTATION Right 03/31/2017   Procedure: RIGHT FOOT 1ST AND 2ND RAY AMPUTATION;  Surgeon: Newt Minion, MD;  Location: [place];  Service: Orthopedics;  Laterality: Right;  . AMPUTATION Right 04/01/2017   Procedure: AMPUTATION BELOW KNEE;  Surgeon: Newt Minion, MD;  Location: [place];  Service: Orthopedics;  Laterality: Right;  . COLONOSCOPY WITH PROPOFOL N/A 10/02/2017   Procedure: COLONOSCOPY WITH PROPOFOL;  Surgeon: [name], MD;  Location: [facility] ENDOSCOPY;  Service: Gastroenterology;  Laterality: N/A;  . CORNEAL TRANSPLANT      There were no vitals filed for this visit.  Subjective Assessment - 09/18/18 1420    Subjective  Patient denies any pain or falls since last session. Compliant with some of his HEP.    Pertinent History  63 year old right-handed male with history of diabetes mellitus, hypertension, legally blind, and CKD presenting for BKA 04/01/17. Patient wears contacts and can see  better now.  Patient had two surgeries, one was a couple toes then they decided to go below the knee. 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 Has ambulated with RW in hospital with PT. Patient has had home health therapy and was in inpatient rehab about a month. Patient reports he was mainly sitting down, with occasional walking, last home health therapy in May. Patient reports some standing up but no exercises. Patient lives alone in the house and is scared they might fall. Patient received prosthetic leg Tuesday of last week.    Limitations  Standing;Walking;House hold activities;Other (comment)    How long can you stand comfortably?  10-15 minutes    How long can you walk comfortably?  10-15 minutes    Patient Stated Goals  going up steps and driving car.     Currently in Pain?  No/denies         Vitals: 140/68 pulse 61     Treatment:     Ambulate >1000 ft with SPC with CGA, improved step length and weight acceptance onto R prosthetic limb. One seated break required due to fatigue. [employer], [employer]. Challenged by upper lumbar/lower thoracic back pain of 4/10.    Seated with heat pad behind back:  Seated TrA activation squishing swiss ball between arms  and knees. 10x 3 second holds    Seated GTB rows 15x upright posture    seated arms crossed upright trunk position marches 10x each LE   Seated ER/IR 10x each LE   GTB marches with alternating UE raises/swings x15 GTB adduction 15x each LE. Cueing for 3 second concentric/eccentric hold GTB abduction 20x with 3-5 second pulse at full position  GTB hamstring curl 15x each LE   PVC pipe "tug of war" for thoracic/spinal stretch 8x with PVC pipe.    Pt educated throughout session about proper posture and technique with exercises. Improved exercise technique, movement at target joints, use of target muscles after min to mod verbal, visual, tactile cues.                     PT Education - 09/18/18 1440    Education Details  exercise technique, body mechanics, elevator positioning    Person(s) Educated  Patient    Methods  Explanation;Demonstration;Tactile cues;Verbal cues    Comprehension  Verbalized understanding;Returned demonstration;Verbal cues required;Tactile cues required       PT Short Term Goals - 08/30/18 1701      PT SHORT TERM GOAL #1   Title  Patient will be independent in home exercise program to improve strength/mobility for better functional independence with ADLs.    Baseline  HEP given, 12/11/17: Performing daily:     Time  2    Period  Weeks    Status  Achieved      PT SHORT TERM GOAL #2   Title  Patient will don/doff prosthesis independently to allow for increased mobility in home.     Baseline  8/14: requires PT to guide/direct through process 8/28: independent    Time  2    Period  Weeks    Status  Achieved      PT SHORT TERM GOAL #3   Title  Patient will ambulate 10 meters with least assistive device and prosthesis to improve mobility in home.     Baseline  8/14: not walking outside of // bars yet  8/28: 63 seconds with RW and CGA     Time  2    Period  Weeks    Status  Achieved      PT SHORT TERM GOAL #4   Title  Patient will perform STS with single UE support to decrease reliance upon UE's for stability     Baseline  patient requires BUE support 11/5: 1 UR support    Time  2    Period  Weeks    Status  Achieved        PT Long Term Goals - 08/30/18 1701      PT LONG TERM GOAL #1   Title  Patient will ambulate >1057f during 644m with LRAD to demonstrate improved community ambulation and functional mobility    Baseline  11/5: will complete next session; 01/22/18: 400' in 3 minutes but had to stop secondary to pain in knee; 01/22/18: 400' in 3 minutes limited by R knee pain, 02/26/18: 500' in 3 minutes limited by low back pain; 06/07/18: Deferred due to new prosthetic; 07/30/18: 800' with  SPC (500' after 3 min)- 2 rest breaks, and ended 30 seconds early due to knee and back pain 8/20 : 600 ft in 3 minutes and 20 seconds; terminated after this due to back pain    Time  12    Period  Weeks    Status  Partially Met  Target Date  11/22/18      PT LONG TERM GOAL #2   Title  Patient (> 46 years old) will complete five times sit to stand test in < 15 seconds indicating an increased LE strength and improved balance.    Baseline  8/14: 61 seconds 8/28; 42 seconds 9/17: 24 seconds BUE support with LLE 10/10: 17 seconds with excessive BUE support  11/5: 18seconds BUE support; 12/11/17: 12/11/17: 18.5s with BUE support but no AD, 01/22/18: 17.1s BUE support but no AD; 02/26/18: 13.0s with BUE, still unable to perform without UE assistance; 06/07/18: 25.4s with single UE support during first rep only; 07/30/18: 24.3s with single UE support during 1st rep only 8/20: 10 seconds BUE support, 16 seconds SUE support    Time  12    Period  Weeks    Status  Partially Met    Target Date  11/22/18      PT LONG TERM GOAL #3   Title  Patient will reduce timed up and go to <11 seconds to reduce fall risk and demonstrate improved transfer/gait ability.    Baseline  8/14: unable to perform 8/28: 1 min 30 seconds 9/17: 38 seconds RW 10/10: 25 seconds with RW 11/5: 20 seconds with cane;, 12/11/17: 17.4s with spc; 01/22/18: 14.8s with spc; 02/26/18: 13.0s with spc; 06/07/18: 16.1s with RW; 07/30/18: 12.44s with SPC 8/20: 11 seconds with SPC    Time  12    Period  Weeks    Status  Achieved      PT LONG TERM GOAL #4   Title  Patient will increase lower extremity functional scale to >60/80 to demonstrate improved functional mobility and increased tolerance with ADLs.     Baseline  814: 29/80 8/28: 20/80 10/10: 20/80 11/5: 22/80; 12/11/17: 18/80; 01/22/18: 27/80; 06/07/18: 30/80; 07/30/18: 35/80 8/20: 36/80    Time  12    Period  Weeks    Status  Partially Met    Target Date  11/22/18      PT LONG TERM GOAL #5    Title  Patient will perform 10 MWT in >1.0 m/s for improved mobility and safety with negotiating natural environment    Baseline  9/17: .53 m/s 10/10: .63 m/s with RW 11/5: 0.49ms; 12/11/17: 15.33s = 0.65 m/s with spc; 01/22/18: self-selected: 14.4s = 0.69 m/s, fastest: 11.8s = 0.85 m/s; 02/26/18: self-selected: 13.3s = 0.75 m/s, fastest: 10.6s = 0.94 m/s; 06/07/18: self-selected: 14.0s = 0.71 m/s, fastest: 11.7s = 0.85 m/s; 07/30/18: self selected: 12.45s = 0.861m, fastest:8.84s = 1.1335m8/20: 1.1 m/s with SPC    Time  12    Period  Weeks    Status  Achieved      PT LONG TERM GOAL #6   Title  Patient will be mod I for negotiating curb with LRAD while wearing prosthetic for safe community ambulation.    Baseline  8/20: patient not able to negotiate curbs    Time  12    Period  Weeks    Status  New    Target Date  11/22/18            Plan - 09/18/18 1502    Clinical Impression Statement  Patient's ambulation limited by low back pain this session requiring a focus on seated interventions for remainder of session. Patient is challenged with repeated prolonged holds indicating limited capacity for prolonged muscle recruitment. He will continue to benefit from skilled physical therapy to improve functional mobility, improve prosthetic use  and increase ambulation to increase LOF and improve overall QOL    Examination-Participation Restrictions  Interpersonal Relationship    Rehab Potential  Fair    Clinical Impairments Affecting Rehab Potential  (+) previous independence, motivation to return to walking (-) lives alone, hx of diabetes, limited vision     PT Frequency  2x / week    PT Duration  8 weeks    PT Treatment/Interventions  ADLs/Self Care Home Management;Cryotherapy;Electrical Stimulation;Ultrasound;Traction;Moist Heat;DME Instruction;Gait training;Stair training;Functional mobility training;Therapeutic activities;Therapeutic exercise;Patient/family education;Neuromuscular  re-education;Balance training;Prosthetic Training;Wheelchair mobility training;Manual techniques;Manual lymph drainage;Compression bandaging;Taping;Energy conservation;Passive range of motion    PT Next Visit Plan  progression of SLS on prosthetic limb,    PT Home Exercise Plan  see [id number] on medbridge, spc ambulation    Consulted and Agree with Plan of Care  Patient       Patient will benefit from skilled therapeutic intervention in order to improve the following deficits and impairments:  Abnormal gait, Decreased activity tolerance, Decreased balance, Decreased knowledge of precautions, Decreased endurance, Decreased knowledge of use of DME, Decreased mobility, Decreased range of motion, Difficulty walking, Decreased strength, Increased edema, Impaired flexibility, Impaired perceived functional ability, Prosthetic Dependency, Postural dysfunction, Improper body mechanics, Pain  Visit Diagnosis: Unsteadiness on feet  Other abnormalities of gait and mobility  Muscle weakness (generalized)     Problem List Patient Active Problem List   Diagnosis Date Noted  . Pain due to onychomycosis of toenail of right foot 07/05/2018  . Abnormality of gait 10/12/2017  . Poorly controlled type 2 diabetes mellitus with peripheral neuropathy ([place])   . Flatulence   . Hypomagnesemia   . Unilateral complete BKA, right, sequela ([place])   . Benign essential HTN   . Hypoalbuminemia due to protein-calorie malnutrition ([place])   . S/P below knee amputation, right ([place]) 04/12/2017  . Acute blood loss anemia   . Other encephalopathy 04/08/2017  . Encephalopathy 04/08/2017  . Altered mental status 04/08/2017  . Labile blood pressure   . Labile blood glucose   . Drug induced constipation   . Stage 3 chronic kidney disease ([place])   . Bacteremia   . S/P unilateral BKA (below knee amputation), right ([place]) 04/04/2017  . PAD (peripheral artery disease) ([place])   . Type 2 diabetes mellitus with right diabetic foot  ulcer ([place])   . Post-operative pain   . Legally blind   . Upper GI bleed   . Streptococcal bacteremia 04/01/2017  . Hypokalemia 03/30/2017  . Uncontrolled type 2 diabetes mellitus with hyperglycemia, with long-term current use of insulin ([place]) 03/30/2017  . Type 2 diabetes mellitus with peripheral neuropathy ([place]) 03/30/2017  . AKI (acute kidney injury) ([place]) 03/30/2017  . CKD stage 3 due to type 2 diabetes mellitus ([place]) 03/30/2017  . Sepsis ([place]) 03/30/2017  . Heart murmur 11/22/2016  . Hyperlipidemia associated with type 2 diabetes mellitus ([place]) 11/22/2016  . Obstructive sleep apnea syndrome 11/22/2016  . Essential hypertension 12/17/2012   Janna Arch, PT, DPT   09/18/2018, 3:03 PM  [place] MAIN [facility] SERVICES [address] [place], [place], [address] Phone: [phone]   Fax:  [phone]  Name: Trevor [name] MRN: [id number] Date of Birth: 1955-09-04

## 2018-09-25 ENCOUNTER — Ambulatory Visit: Payer: Medicare Other

## 2018-09-25 ENCOUNTER — Other Ambulatory Visit: Payer: Self-pay

## 2018-09-25 DIAGNOSIS — R2689 Other abnormalities of gait and mobility: Secondary | ICD-10-CM

## 2018-09-25 DIAGNOSIS — R2681 Unsteadiness on feet: Secondary | ICD-10-CM

## 2018-09-25 DIAGNOSIS — S88111S Complete traumatic amputation at level between knee and ankle, right lower leg, sequela: Secondary | ICD-10-CM

## 2018-09-25 DIAGNOSIS — M6281 Muscle weakness (generalized): Secondary | ICD-10-CM

## 2018-09-25 NOTE — Therapy (Signed)
[place] MAIN [facility] SERVICES [address] [place], [place], [address] Phone: [phone]   Fax:  [phone]  Physical Therapy Treatment  Patient Details  Name: Trevor [name] MRN: [id number] Date of Birth: December 31, 1955 No data recorded  Encounter Date: 09/25/2018  PT End of Session - 09/25/18 1519    Visit Number  72    Number of Visits  91    Date for PT Re-Evaluation  11/22/18    Authorization Type  Eval 08/23/17; Last goals 08/30/18    PT Start Time  1430    PT Stop Time  1515    PT Time Calculation (min)  45 min    Equipment Utilized During Treatment  Gait belt   Rt BKA prosthesis   Activity Tolerance  Patient tolerated treatment well;Patient limited by fatigue    Behavior During Therapy  WFL for tasks assessed/performed       Past Medical History:  Diagnosis Date  . Diabetes mellitus without complication ([place])   . Heart murmur   . Hyperlipidemia   . Hypertension   . Sleep apnea     Past Surgical History:  Procedure Laterality Date  . AMPUTATION Right 03/31/2017   Procedure: RIGHT FOOT 1ST AND 2ND RAY AMPUTATION;  Surgeon: Newt Minion, MD;  Location: [place];  Service: Orthopedics;  Laterality: Right;  . AMPUTATION Right 04/01/2017   Procedure: AMPUTATION BELOW KNEE;  Surgeon: Newt Minion, MD;  Location: [place];  Service: Orthopedics;  Laterality: Right;  . COLONOSCOPY WITH PROPOFOL N/A 10/02/2017   Procedure: COLONOSCOPY WITH PROPOFOL;  Surgeon: [name], MD;  Location: [facility]COPY;  Service: Gastroenterology;  Laterality: N/A;  . CORNEAL TRANSPLANT      There were no vitals filed for this visit.  Subjective Assessment - 09/25/18 1517    Subjective  Patient reports having a good weekend, was busy all weekend. No falls or LOB since last session.    Pertinent History  63 year old right-handed male with history of diabetes mellitus, hypertension, legally blind, and CKD presenting for BKA 04/01/17. Patient wears contacts  and can see better now.  Patient had two surgeries, one was a couple toes then they decided to go below the knee. 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 Has ambulated with RW in hospital with PT. Patient has had home health therapy and was in inpatient rehab about a month. Patient reports he was mainly sitting down, with occasional walking, last home health therapy in May. Patient reports some standing up but no exercises. Patient lives alone in the house and is scared they might fall. Patient received prosthetic leg Tuesday of last week.    Limitations  Standing;Walking;House hold activities;Other (comment)    How long can you stand comfortably?  10-15 minutes    How long can you walk comfortably?  10-15 minutes    Patient Stated Goals  going up steps and driving car.     Currently in Pain?  No/denies        Vitals: 139/72 pulse 63     TREATMENT     Gait Training Ambulate >1000 ft with SPC with CGA, improved step length and weight acceptance onto R prosthetic limb. One seated break required due to fatigue.[employer], [employer].   Negotiating incline/decline ramp with SPC and CGA. Ascending: cueing for hip flexion for reduction of shuffling and improved foot clearance. Descending: core activation  for stability and postural support x 3 trials. Seated rest break at end of trials.       Ther-ex  Seated hip flexion marches with GTB  x 15; Seated GTB resisted adductor squeeze x 15; Seated manually resisted R LAQ x 15  Seated GTB R HS curls x 15; Seated GTB rows 15x upright posture ; Seated GTB hip abduction x 15 bilateral; Seated gluteal activation 10x 3 second holds     Pt educated throughout session about proper posture and technique with exercises. Improved exercise technique, movement at target joints, use of target muscles after min to mod verbal, visual, tactile cues.                        PT Education - 09/25/18 1518    Education Details  negotiating ramp, body mechanics    Person(s) Educated  Patient    Methods  Explanation;Demonstration;Tactile cues;Verbal cues    Comprehension  Verbalized understanding;Returned demonstration;Verbal cues required;Tactile cues required       PT Short Term Goals - 08/30/18 1701      PT SHORT TERM GOAL #1   Title  Patient will be independent in home exercise program to improve strength/mobility for better functional independence with ADLs.    Baseline  HEP given, 12/11/17: Performing daily:     Time  2    Period  Weeks    Status  Achieved      PT SHORT TERM GOAL #2   Title  Patient will don/doff prosthesis independently to allow for increased mobility in home.     Baseline  8/14: requires PT to guide/direct through process 8/28: independent    Time  2    Period  Weeks    Status  Achieved      PT SHORT TERM GOAL #3   Title  Patient will ambulate 10 meters with least assistive device and prosthesis to improve mobility in home.     Baseline  8/14: not walking outside of // bars yet  8/28: 63 seconds with RW and CGA     Time  2    Period  Weeks    Status  Achieved      PT SHORT TERM GOAL #4   Title  Patient will perform STS with single UE support to decrease reliance upon UE's for stability     Baseline  patient requires BUE support 11/5: 1 UR support    Time  2    Period  Weeks    Status  Achieved        PT Long Term Goals - 08/30/18 1701      PT LONG TERM GOAL #1   Title  Patient will ambulate >1025f during 647m with LRAD to demonstrate improved community ambulation and functional mobility    Baseline  11/5: will complete next session; 01/22/18: 400' in 3 minutes but had to stop secondary to pain in knee; 01/22/18: 400' in 3 minutes limited by R knee pain, 02/26/18: 500' in 3 minutes limited by low back pain; 06/07/18: Deferred due to new prosthetic; 07/30/18: 800' with SPC (500' after 3  min)- 2 rest breaks, and ended 30 seconds early due to knee and back pain 8/20 : 600 ft in 3 minutes and 20 seconds; terminated after this due to back pain    Time  12    Period  Weeks    Status  Partially Met    Target Date  11/22/18  PT LONG TERM GOAL #2   Title  Patient (> 4 years old) will complete five times sit to stand test in < 15 seconds indicating an increased LE strength and improved balance.    Baseline  8/14: 61 seconds 8/28; 42 seconds 9/17: 24 seconds BUE support with LLE 10/10: 17 seconds with excessive BUE support  11/5: 18seconds BUE support; 12/11/17: 12/11/17: 18.5s with BUE support but no AD, 01/22/18: 17.1s BUE support but no AD; 02/26/18: 13.0s with BUE, still unable to perform without UE assistance; 06/07/18: 25.4s with single UE support during first rep only; 07/30/18: 24.3s with single UE support during 1st rep only 8/20: 10 seconds BUE support, 16 seconds SUE support    Time  12    Period  Weeks    Status  Partially Met    Target Date  11/22/18      PT LONG TERM GOAL #3   Title  Patient will reduce timed up and go to <11 seconds to reduce fall risk and demonstrate improved transfer/gait ability.    Baseline  8/14: unable to perform 8/28: 1 min 30 seconds 9/17: 38 seconds RW 10/10: 25 seconds with RW 11/5: 20 seconds with cane;, 12/11/17: 17.4s with spc; 01/22/18: 14.8s with spc; 02/26/18: 13.0s with spc; 06/07/18: 16.1s with RW; 07/30/18: 12.44s with SPC 8/20: 11 seconds with SPC    Time  12    Period  Weeks    Status  Achieved      PT LONG TERM GOAL #4   Title  Patient will increase lower extremity functional scale to >60/80 to demonstrate improved functional mobility and increased tolerance with ADLs.     Baseline  814: 29/80 8/28: 20/80 10/10: 20/80 11/5: 22/80; 12/11/17: 18/80; 01/22/18: 27/80; 06/07/18: 30/80; 07/30/18: 35/80 8/20: 36/80    Time  12    Period  Weeks    Status  Partially Met    Target Date  11/22/18      PT LONG TERM GOAL #5   Title  Patient will  perform 10 MWT in >1.0 m/s for improved mobility and safety with negotiating natural environment    Baseline  9/17: .53 m/s 10/10: .63 m/s with RW 11/5: 0.62ms; 12/11/17: 15.33s = 0.65 m/s with spc; 01/22/18: self-selected: 14.4s = 0.69 m/s, fastest: 11.8s = 0.85 m/s; 02/26/18: self-selected: 13.3s = 0.75 m/s, fastest: 10.6s = 0.94 m/s; 06/07/18: self-selected: 14.0s = 0.71 m/s, fastest: 11.7s = 0.85 m/s; 07/30/18: self selected: 12.45s = 0.833m, fastest:8.84s = 1.1356m8/20: 1.1 m/s with SPC    Time  12    Period  Weeks    Status  Achieved      PT LONG TERM GOAL #6   Title  Patient will be mod I for negotiating curb with LRAD while wearing prosthetic for safe community ambulation.    Baseline  8/20: patient not able to negotiate curbs    Time  12    Period  Weeks    Status  New    Target Date  11/22/18            Plan - 09/25/18 1521    Clinical Impression Statement  Patient performed negotiation of a ramp with use of a SPC and CGA, patient demonstrated improved mechanics with repetition resulting in improved stability and smoothness of ascending/descending.He will continue to benefit from skilled physical therapy to improve functional mobility, improve prosthetic use and increase ambulation to increase LOF and improve overall QOL    Examination-Participation Restrictions  Interpersonal Relationship    Rehab Potential  Fair    Clinical Impairments Affecting Rehab Potential  (+) previous independence, motivation to return to walking (-) lives alone, hx of diabetes, limited vision     PT Frequency  2x / week    PT Duration  8 weeks    PT Treatment/Interventions  ADLs/Self Care Home Management;Cryotherapy;Electrical Stimulation;Ultrasound;Traction;Moist Heat;DME Instruction;Gait training;Stair training;Functional mobility training;Therapeutic activities;Therapeutic exercise;Patient/family education;Neuromuscular re-education;Balance training;Prosthetic Training;Wheelchair mobility  training;Manual techniques;Manual lymph drainage;Compression bandaging;Taping;Energy conservation;Passive range of motion    PT Next Visit Plan  progression of SLS on prosthetic limb,    PT Home Exercise Plan  see [id number] on medbridge, spc ambulation    Consulted and Agree with Plan of Care  Patient       Patient will benefit from skilled therapeutic intervention in order to improve the following deficits and impairments:  Abnormal gait, Decreased activity tolerance, Decreased balance, Decreased knowledge of precautions, Decreased endurance, Decreased knowledge of use of DME, Decreased mobility, Decreased range of motion, Difficulty walking, Decreased strength, Increased edema, Impaired flexibility, Impaired perceived functional ability, Prosthetic Dependency, Postural dysfunction, Improper body mechanics, Pain  Visit Diagnosis: Unsteadiness on feet  Other abnormalities of gait and mobility  Muscle weakness (generalized)  Unilateral complete BKA, right, sequela ([place])     Problem List Patient Active Problem List   Diagnosis Date Noted  . Pain due to onychomycosis of toenail of right foot 07/05/2018  . Abnormality of gait 10/12/2017  . Poorly controlled type 2 diabetes mellitus with peripheral neuropathy ([place])   . Flatulence   . Hypomagnesemia   . Unilateral complete BKA, right, sequela ([place])   . Benign essential HTN   . Hypoalbuminemia due to protein-calorie malnutrition ([place])   . S/P below knee amputation, right ([place]) 04/12/2017  . Acute blood loss anemia   . Other encephalopathy 04/08/2017  . Encephalopathy 04/08/2017  . Altered mental status 04/08/2017  . Labile blood pressure   . Labile blood glucose   . Drug induced constipation   . Stage 3 chronic kidney disease ([place])   . Bacteremia   . S/P unilateral BKA (below knee amputation), right ([place]) 04/04/2017  . PAD (peripheral artery disease) ([place])   . Type 2 diabetes mellitus with right diabetic foot ulcer ([place])   .  Post-operative pain   . Legally blind   . Upper GI bleed   . Streptococcal bacteremia 04/01/2017  . Hypokalemia 03/30/2017  . Uncontrolled type 2 diabetes mellitus with hyperglycemia, with long-term current use of insulin ([place]) 03/30/2017  . Type 2 diabetes mellitus with peripheral neuropathy ([place]) 03/30/2017  . AKI (acute kidney injury) ([place]) 03/30/2017  . CKD stage 3 due to type 2 diabetes mellitus ([place]) 03/30/2017  . Sepsis ([place]) 03/30/2017  . Heart murmur 11/22/2016  . Hyperlipidemia associated with type 2 diabetes mellitus ([employer]) 11/22/2016  . Obstructive sleep apnea syndrome 11/22/2016  . Essential hypertension 12/17/2012   Janna Arch, PT, DPT   09/25/2018, 3:22 PM  [place] MAIN [facility] SERVICES [address] [place], [place], [address] Phone: [phone]   Fax:  [phone]  Name: Trevor [name] MRN: [id number] Date of Birth: [birth year]

## 2018-09-27 ENCOUNTER — Ambulatory Visit: Payer: Medicare Other

## 2018-09-27 ENCOUNTER — Other Ambulatory Visit: Payer: Self-pay

## 2018-09-27 DIAGNOSIS — R2689 Other abnormalities of gait and mobility: Secondary | ICD-10-CM

## 2018-09-27 DIAGNOSIS — M6281 Muscle weakness (generalized): Secondary | ICD-10-CM

## 2018-09-27 DIAGNOSIS — R2681 Unsteadiness on feet: Secondary | ICD-10-CM

## 2018-09-27 NOTE — Therapy (Signed)
[place] MAIN [facility] SERVICES [address] [place], [place], [address] Phone: [phone]   Fax:  [phone]  Physical Therapy Treatment  Patient Details  Name: Trevor [name] MRN: [id number] Date of Birth: 04-14-1955 No data recorded  Encounter Date: 09/27/2018  PT End of Session - 09/27/18 1454    Visit Number  29    Number of Visits  91    Date for PT Re-Evaluation  11/22/18    Authorization Type  Eval 08/23/17; Last goals 08/30/18    PT Start Time  1430    PT Stop Time  1510    PT Time Calculation (min)  40 min    Equipment Utilized During Treatment  Gait belt   Rt BKA prosthesis   Activity Tolerance  Patient tolerated treatment well;Patient limited by fatigue    Behavior During Therapy  WFL for tasks assessed/performed       Past Medical History:  Diagnosis Date  . Diabetes mellitus without complication ([place])   . Heart murmur   . Hyperlipidemia   . Hypertension   . Sleep apnea     Past Surgical History:  Procedure Laterality Date  . AMPUTATION Right 03/31/2017   Procedure: RIGHT FOOT 1ST AND 2ND RAY AMPUTATION;  Surgeon: Newt Minion, MD;  Location: [place];  Service: Orthopedics;  Laterality: Right;  . AMPUTATION Right 04/01/2017   Procedure: AMPUTATION BELOW KNEE;  Surgeon: Newt Minion, MD;  Location: [place];  Service: Orthopedics;  Laterality: Right;  . COLONOSCOPY WITH PROPOFOL N/A 10/02/2017   Procedure: COLONOSCOPY WITH PROPOFOL;  Surgeon: [name], MD;  Location: [facility];  Service: Gastroenterology;  Laterality: N/A;  . CORNEAL TRANSPLANT      There were no vitals filed for this visit.  Subjective Assessment - 09/27/18 1437    Subjective  Patient reports he had a fall since last session when transferring in the bathroom due to forgetting to lock his wheelchair. Is not injured just was startled.    Pertinent History  63 year old right-handed male with history of diabetes mellitus, hypertension, legally  blind, and CKD presenting for BKA 04/01/17. Patient wears contacts and can see better now.  Patient had two surgeries, one was a couple toes then they decided to go below the knee. 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 Has ambulated with RW in hospital with PT. Patient has had home health therapy and was in inpatient rehab about a month. Patient reports he was mainly sitting down, with occasional walking, last home health therapy in May. Patient reports some standing up but no exercises. Patient lives alone in the house and is scared they might fall. Patient received prosthetic leg Tuesday of last week.    Limitations  Standing;Walking;House hold activities;Other (comment)    How long can you stand comfortably?  10-15 minutes    How long can you walk comfortably?  10-15 minutes    Patient Stated Goals  going up steps and driving car.     Currently in Pain?  No/denies         Vitals: 152/72 pulse 59     TREATMENT      Changing speeds with cones: red to orange cone slow exaggerated velocity, orange to yellow normal gait speed, yellow to green fast walk.  6x    Side stepping in // bars 4x length of bars intermittent BUE support; cueing  for upright posture and squat position  Standing in // bars: tossing football inside and outside BOS for stability challenge   Weighted ball (2000Gr) chest press standing in // bars 10x, challenging to LUE  Weighted ball (2000 Gr) straight arms raise in // bars 10x ; very challenging to LUE   Seated hip flexion marches with GTB  x 15; Seated GTB hip abduction x 15 bilateral; one LE at a time Seated gluteal activation 10x 3 second holds      Pt educated throughout session about proper posture and technique with exercises. Improved exercise technique, movement at target joints, use of target muscles after min to mod verbal, visual, tactile cues.                          PT Education - 09/27/18 1437    Education Details  exercise technique, body mechanics, double checking wheelchair before transfer    Person(s) Educated  Patient    Methods  Explanation;Demonstration;Tactile cues;Verbal cues    Comprehension  Verbalized understanding;Returned demonstration;Verbal cues required;Tactile cues required       PT Short Term Goals - 08/30/18 1701      PT SHORT TERM GOAL #1   Title  Patient will be independent in home exercise program to improve strength/mobility for better functional independence with ADLs.    Baseline  HEP given, 12/11/17: Performing daily:     Time  2    Period  Weeks    Status  Achieved      PT SHORT TERM GOAL #2   Title  Patient will don/doff prosthesis independently to allow for increased mobility in home.     Baseline  8/14: requires PT to guide/direct through process 8/28: independent    Time  2    Period  Weeks    Status  Achieved      PT SHORT TERM GOAL #3   Title  Patient will ambulate 10 meters with least assistive device and prosthesis to improve mobility in home.     Baseline  8/14: not walking outside of // bars yet  8/28: 63 seconds with RW and CGA     Time  2    Period  Weeks    Status  Achieved      PT SHORT TERM GOAL #4   Title  Patient will perform STS with single UE support to decrease reliance upon UE's for stability     Baseline  patient requires BUE support 11/5: 1 UR support    Time  2    Period  Weeks    Status  Achieved        PT Long Term Goals - 08/30/18 1701      PT LONG TERM GOAL #1   Title  Patient will ambulate >1029f during 628m with LRAD to demonstrate improved community ambulation and functional mobility    Baseline  11/5: will complete next session; 01/22/18: 400' in 3 minutes but had to stop secondary to pain in knee; 01/22/18: 400' in 3 minutes limited by R knee pain, 02/26/18: 500' in 3 minutes limited by low back pain; 06/07/18: Deferred due to new  prosthetic; 07/30/18: 800' with SPC (500' after 3 min)- 2 rest breaks, and ended 30 seconds early due to knee and back pain 8/20 : 600 ft in 3 minutes and 20 seconds; terminated after this due to back pain    Time  12    Period  Weeks  Status  Partially Met    Target Date  11/22/18      PT LONG TERM GOAL #2   Title  Patient (> 25 years old) will complete five times sit to stand test in < 15 seconds indicating an increased LE strength and improved balance.    Baseline  8/14: 61 seconds 8/28; 42 seconds 9/17: 24 seconds BUE support with LLE 10/10: 17 seconds with excessive BUE support  11/5: 18seconds BUE support; 12/11/17: 12/11/17: 18.5s with BUE support but no AD, 01/22/18: 17.1s BUE support but no AD; 02/26/18: 13.0s with BUE, still unable to perform without UE assistance; 06/07/18: 25.4s with single UE support during first rep only; 07/30/18: 24.3s with single UE support during 1st rep only 8/20: 10 seconds BUE support, 16 seconds SUE support    Time  12    Period  Weeks    Status  Partially Met    Target Date  11/22/18      PT LONG TERM GOAL #3   Title  Patient will reduce timed up and go to <11 seconds to reduce fall risk and demonstrate improved transfer/gait ability.    Baseline  8/14: unable to perform 8/28: 1 min 30 seconds 9/17: 38 seconds RW 10/10: 25 seconds with RW 11/5: 20 seconds with cane;, 12/11/17: 17.4s with spc; 01/22/18: 14.8s with spc; 02/26/18: 13.0s with spc; 06/07/18: 16.1s with RW; 07/30/18: 12.44s with SPC 8/20: 11 seconds with SPC    Time  12    Period  Weeks    Status  Achieved      PT LONG TERM GOAL #4   Title  Patient will increase lower extremity functional scale to >60/80 to demonstrate improved functional mobility and increased tolerance with ADLs.     Baseline  814: 29/80 8/28: 20/80 10/10: 20/80 11/5: 22/80; 12/11/17: 18/80; 01/22/18: 27/80; 06/07/18: 30/80; 07/30/18: 35/80 8/20: 36/80    Time  12    Period  Weeks    Status  Partially Met    Target Date  11/22/18       PT LONG TERM GOAL #5   Title  Patient will perform 10 MWT in >1.0 m/s for improved mobility and safety with negotiating natural environment    Baseline  9/17: .53 m/s 10/10: .63 m/s with RW 11/5: 0.98ms; 12/11/17: 15.33s = 0.65 m/s with spc; 01/22/18: self-selected: 14.4s = 0.69 m/s, fastest: 11.8s = 0.85 m/s; 02/26/18: self-selected: 13.3s = 0.75 m/s, fastest: 10.6s = 0.94 m/s; 06/07/18: self-selected: 14.0s = 0.71 m/s, fastest: 11.7s = 0.85 m/s; 07/30/18: self selected: 12.45s = 0.843m, fastest:8.84s = 1.1342m8/20: 1.1 m/s with SPC    Time  12    Period  Weeks    Status  Achieved      PT LONG TERM GOAL #6   Title  Patient will be mod I for negotiating curb with LRAD while wearing prosthetic for safe community ambulation.    Baseline  8/20: patient not able to negotiate curbs    Time  12    Period  Weeks    Status  New    Target Date  11/22/18            Plan - 09/27/18 1502    Clinical Impression Statement  Patient presents with slight fear of standing mobility at beginning of session due to recent fall. By end of session patient has return of confidence with improved stability. UE fatigue limits some standing interventions. He will continue to benefit from skilled physical  therapy to improve functional mobility, improve prosthetic use and increase ambulation to increase LOF and improve overall QOL    Examination-Participation Restrictions  Interpersonal Relationship    Rehab Potential  Fair    Clinical Impairments Affecting Rehab Potential  (+) previous independence, motivation to return to walking (-) lives alone, hx of diabetes, limited vision     PT Frequency  2x / week    PT Duration  8 weeks    PT Treatment/Interventions  ADLs/Self Care Home Management;Cryotherapy;Electrical Stimulation;Ultrasound;Traction;Moist Heat;DME Instruction;Gait training;Stair training;Functional mobility training;Therapeutic activities;Therapeutic exercise;Patient/family education;Neuromuscular  re-education;Balance training;Prosthetic Training;Wheelchair mobility training;Manual techniques;Manual lymph drainage;Compression bandaging;Taping;Energy conservation;Passive range of motion    PT Next Visit Plan  progression of SLS on prosthetic limb,    PT Home Exercise Plan  see [id number] on medbridge, spc ambulation    Consulted and Agree with Plan of Care  Patient       Patient will benefit from skilled therapeutic intervention in order to improve the following deficits and impairments:  Abnormal gait, Decreased activity tolerance, Decreased balance, Decreased knowledge of precautions, Decreased endurance, Decreased knowledge of use of DME, Decreased mobility, Decreased range of motion, Difficulty walking, Decreased strength, Increased edema, Impaired flexibility, Impaired perceived functional ability, Prosthetic Dependency, Postural dysfunction, Improper body mechanics, Pain  Visit Diagnosis: Unsteadiness on feet  Other abnormalities of gait and mobility  Muscle weakness (generalized)     Problem List Patient Active Problem List   Diagnosis Date Noted  . Pain due to onychomycosis of toenail of right foot 07/05/2018  . Abnormality of gait 10/12/2017  . Poorly controlled type 2 diabetes mellitus with peripheral neuropathy ([place])   . Flatulence   . Hypomagnesemia   . Unilateral complete BKA, right, sequela ([place])   . Benign essential HTN   . Hypoalbuminemia due to protein-calorie malnutrition ([place])   . S/P below knee amputation, right ([place]) 04/12/2017  . Acute blood loss anemia   . Other encephalopathy 04/08/2017  . Encephalopathy 04/08/2017  . Altered mental status 04/08/2017  . Labile blood pressure   . Labile blood glucose   . Drug induced constipation   . Stage 3 chronic kidney disease ([place])   . Bacteremia   . S/P unilateral BKA (below knee amputation), right ([place]) 04/04/2017  . PAD (peripheral artery disease) ([place])   . 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 Post-operative pain   . Legally blind   . Upper GI bleed   . Streptococcal bacteremia 04/01/2017  . Hypokalemia 03/30/2017  . Uncontrolled type 2 diabetes mellitus with hyperglycemia, with long-term current use of insulin ([place]) 03/30/2017  . Type 2 diabetes mellitus with peripheral neuropathy ([place]) 03/30/2017  . AKI (acute kidney injury) ([place]) 03/30/2017  . CKD stage 3 due to type 2 diabetes mellitus ([place]) 03/30/2017  . Sepsis ([place]) 03/30/2017  . Heart murmur 11/22/2016  . Hyperlipidemia associated with type 2 diabetes mellitus ([place]) 11/22/2016  . Obstructive sleep apnea syndrome 11/22/2016  . Essential hypertension 12/17/2012   Janna Arch, PT, DPT   09/27/2018, 3:12 PM  [place] MAIN [facility] SERVICES [address] [place], [place], [address] Phone: [phone]   Fax:  [phone]  Name: Trevor [name] MRN: [id number] Date of Birth: 1955-03-04

## 2018-10-02 ENCOUNTER — Other Ambulatory Visit: Payer: Self-pay

## 2018-10-02 ENCOUNTER — Ambulatory Visit: Payer: Medicare Other

## 2018-10-02 VITALS — BP 141/76 | HR 63

## 2018-10-02 DIAGNOSIS — R2681 Unsteadiness on feet: Secondary | ICD-10-CM | POA: Diagnosis not present

## 2018-10-02 DIAGNOSIS — R2689 Other abnormalities of gait and mobility: Secondary | ICD-10-CM

## 2018-10-02 DIAGNOSIS — M6281 Muscle weakness (generalized): Secondary | ICD-10-CM

## 2018-10-02 NOTE — Therapy (Signed)
[place] MAIN [facility] SERVICES [address] [place], [place], [address] Phone: [phone]   Fax:  [phone]  Physical Therapy Treatment  Patient Details  Name: Trevor [name] MRN: [id number] Date of Birth: [birth year] No data recorded  Encounter Date: 10/02/2018  PT End of Session - 10/02/18 1553    Visit Number  38    Number of Visits  91    Date for PT Re-Evaluation  11/22/18    Authorization Type  Eval 08/23/17; Last goals 08/30/18    PT Start Time  1435    PT Stop Time  1516    PT Time Calculation (min)  41 min    Equipment Utilized During Treatment  Gait belt   Rt BKA prosthesis   Activity Tolerance  Patient tolerated treatment well;Patient limited by fatigue    Behavior During Therapy  WFL for tasks assessed/performed       Past Medical History:  Diagnosis Date  . Diabetes mellitus without complication ([place])   . Heart murmur   . Hyperlipidemia   . Hypertension   . Sleep apnea     Past Surgical History:  Procedure Laterality Date  . AMPUTATION Right 03/31/2017   Procedure: RIGHT FOOT 1ST AND 2ND RAY AMPUTATION;  Surgeon: Newt Minion, MD;  Location: [place];  Service: Orthopedics;  Laterality: Right;  . AMPUTATION Right 04/01/2017   Procedure: AMPUTATION BELOW KNEE;  Surgeon: Newt Minion, MD;  Location: [place];  Service: Orthopedics;  Laterality: Right;  . COLONOSCOPY WITH PROPOFOL N/A 10/02/2017   Procedure: COLONOSCOPY WITH PROPOFOL;  Surgeon: [name], MD;  Location: [facility] ENDOSCOPY;  Service: Gastroenterology;  Laterality: N/A;  . CORNEAL TRANSPLANT      Vitals:   10/02/18 1438  BP: (!) 141/76  Pulse: 63  SpO2: 100%    Subjective Assessment - 10/02/18 1552    Subjective  Patient reports he is doing well today. He was having some low back pain this morning but not currently. He is afraid that it is "waiting to flare up." No specific questions or concerns at this time.    Pertinent History  63 year old  right-handed male with history of diabetes mellitus, hypertension, legally blind, and CKD presenting for BKA 04/01/17. Patient wears contacts and can see better now.  Patient had two surgeries, one was a couple toes then they decided to go below the knee. 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 Has ambulated with RW in hospital with PT. Patient has had home health therapy and was in inpatient rehab about a month. Patient reports he was mainly sitting down, with occasional walking, last home health therapy in May. Patient reports some standing up but no exercises. Patient lives alone in the house and is scared they might fall. Patient received prosthetic leg Tuesday of last week.    Limitations  Standing;Walking;House hold activities;Other (comment)    How long can you stand comfortably?  10-15 minutes    How long can you walk comfortably?  10-15 minutes    Patient Stated Goals  going up steps and driving car.     Currently in Pain?  No/denies         TREATMENT   Gait Training Gait trainingin [id number]' withsingle point caneprogressing to noassistive devicefor approximately 1/4 of the ambulation distance.Pt reports mild increase in low back pain and is unable to progress ambulation distance any  farther at this time. One seated rest break required. Performed horizontal/verticalhead turns,gait speed changes, and quick stops.   Ther-ex  Quantum leg presssingle leg (one leg at a time)105# x 15, 120# x 15 each LE; Side stepping in // bars 4x length of bars without UE support cueing for upright posture; Seated hip flexion marches x 15; Seated manually resisted clams x 15; Seated manually resisted adductor squeeze x 15; 6" step taps in // bars without UE support alternating LE x 10 each; 6" step-ups leading with RLE up and LLE down x 10 with faded UE support from BUE to SUE; NuStep L1-3 x 5 minutes for cooldown at  patient's request, fatigue and resistance monitored and adjusted throughout;   Pt educated throughout session about proper posture and technique with exercises. Improved exercise technique, movement at target joints, use of target muscles after min to mod verbal, visual, tactile cues.   Pt is making excellent progress with physical therapy and good motivation today. He is able to continue ambulating with therapy during session today but is unable to increase his distance due to increase in back pain. He demonstrates improved single leg stability today during step taps and step-ups. Encouraged pt to continue HEP and follow-up as scheduled. Pt will benefit from PT services to address deficits in strength, balance, and mobility in order to return to full function at home.             PT Short Term Goals - 08/30/18 1701      PT SHORT TERM GOAL #1   Title  Patient will be independent in home exercise program to improve strength/mobility for better functional independence with ADLs.    Baseline  HEP given, 12/11/17: Performing daily:     Time  2    Period  Weeks    Status  Achieved      PT SHORT TERM GOAL #2   Title  Patient will don/doff prosthesis independently to allow for increased mobility in home.     Baseline  8/14: requires PT to guide/direct through process 8/28: independent    Time  2    Period  Weeks    Status  Achieved      PT SHORT TERM GOAL #3   Title  Patient will ambulate 10 meters with least assistive device and prosthesis to improve mobility in home.     Baseline  8/14: not walking outside of // bars yet  8/28: 63 seconds with RW and CGA     Time  2    Period  Weeks    Status  Achieved      PT SHORT TERM GOAL #4   Title  Patient will perform STS with single UE support to decrease reliance upon UE's for stability     Baseline  patient requires BUE support 11/5: 1 UR support    Time  2    Period  Weeks    Status  Achieved        PT Long Term Goals -  08/30/18 1701      PT LONG TERM GOAL #1   Title  Patient will ambulate >1056f during 650m with LRAD to demonstrate improved community ambulation and functional mobility    Baseline  11/5: will complete next session; 01/22/18: 400' in 3 minutes but had to stop secondary to pain in knee; 01/22/18: 400' in 3 minutes limited by R knee pain, 02/26/18: 500' in 3 minutes limited by low back pain; 06/07/18: Deferred due to  new prosthetic; 07/30/18: 800' with SPC (500' after 3 min)- 2 rest breaks, and ended 30 seconds early due to knee and back pain 8/20 : 600 ft in 3 minutes and 20 seconds; terminated after this due to back pain    Time  12    Period  Weeks    Status  Partially Met    Target Date  11/22/18      PT LONG TERM GOAL #2   Title  Patient (> 9 years old) will complete five times sit to stand test in < 15 seconds indicating an increased LE strength and improved balance.    Baseline  8/14: 61 seconds 8/28; 42 seconds 9/17: 24 seconds BUE support with LLE 10/10: 17 seconds with excessive BUE support  11/5: 18seconds BUE support; 12/11/17: 12/11/17: 18.5s with BUE support but no AD, 01/22/18: 17.1s BUE support but no AD; 02/26/18: 13.0s with BUE, still unable to perform without UE assistance; 06/07/18: 25.4s with single UE support during first rep only; 07/30/18: 24.3s with single UE support during 1st rep only 8/20: 10 seconds BUE support, 16 seconds SUE support    Time  12    Period  Weeks    Status  Partially Met    Target Date  11/22/18      PT LONG TERM GOAL #3   Title  Patient will reduce timed up and go to <11 seconds to reduce fall risk and demonstrate improved transfer/gait ability.    Baseline  8/14: unable to perform 8/28: 1 min 30 seconds 9/17: 38 seconds RW 10/10: 25 seconds with RW 11/5: 20 seconds with cane;, 12/11/17: 17.4s with spc; 01/22/18: 14.8s with spc; 02/26/18: 13.0s with spc; 06/07/18: 16.1s with RW; 07/30/18: 12.44s with SPC 8/20: 11 seconds with SPC    Time  12    Period  Weeks     Status  Achieved      PT LONG TERM GOAL #4   Title  Patient will increase lower extremity functional scale to >60/80 to demonstrate improved functional mobility and increased tolerance with ADLs.     Baseline  814: 29/80 8/28: 20/80 10/10: 20/80 11/5: 22/80; 12/11/17: 18/80; 01/22/18: 27/80; 06/07/18: 30/80; 07/30/18: 35/80 8/20: 36/80    Time  12    Period  Weeks    Status  Partially Met    Target Date  11/22/18      PT LONG TERM GOAL #5   Title  Patient will perform 10 MWT in >1.0 m/s for improved mobility and safety with negotiating natural environment    Baseline  9/17: .53 m/s 10/10: .63 m/s with RW 11/5: 0.65ms; 12/11/17: 15.33s = 0.65 m/s with spc; 01/22/18: self-selected: 14.4s = 0.69 m/s, fastest: 11.8s = 0.85 m/s; 02/26/18: self-selected: 13.3s = 0.75 m/s, fastest: 10.6s = 0.94 m/s; 06/07/18: self-selected: 14.0s = 0.71 m/s, fastest: 11.7s = 0.85 m/s; 07/30/18: self selected: 12.45s = 0.845m, fastest:8.84s = 1.1359m8/20: 1.1 m/s with SPC    Time  12    Period  Weeks    Status  Achieved      PT LONG TERM GOAL #6   Title  Patient will be mod I for negotiating curb with LRAD while wearing prosthetic for safe community ambulation.    Baseline  8/20: patient not able to negotiate curbs    Time  12    Period  Weeks    Status  New    Target Date  11/22/18  Plan - 10/02/18 1554    Clinical Impression Statement  Pt is making excellent progress with physical therapy and good motivation today. He is able to continue ambulating with therapy during session today but is unable to increase his distance due to increase in back pain. He demonstrates improved single leg stability today during step taps and step-ups. Encouraged pt to continue HEP and follow-up as scheduled. Pt will benefit from PT services to address deficits in strength, balance, and mobility in order to return to full function at home.    Examination-Participation Restrictions  Interpersonal Relationship    Rehab  Potential  Fair    Clinical Impairments Affecting Rehab Potential  (+) previous independence, motivation to return to walking (-) lives alone, hx of diabetes, limited vision     PT Frequency  2x / week    PT Duration  8 weeks    PT Treatment/Interventions  ADLs/Self Care Home Management;Cryotherapy;Electrical Stimulation;Ultrasound;Traction;Moist Heat;DME Instruction;Gait training;Stair training;Functional mobility training;Therapeutic activities;Therapeutic exercise;Patient/family education;Neuromuscular re-education;Balance training;Prosthetic Training;Wheelchair mobility training;Manual techniques;Manual lymph drainage;Compression bandaging;Taping;Energy conservation;Passive range of motion    PT Next Visit Plan  progression of SLS on prosthetic limb,    PT Home Exercise Plan  see [id number] on medbridge, spc ambulation    Consulted and Agree with Plan of Care  Patient       Patient will benefit from skilled therapeutic intervention in order to improve the following deficits and impairments:  Abnormal gait, Decreased activity tolerance, Decreased balance, Decreased knowledge of precautions, Decreased endurance, Decreased knowledge of use of DME, Decreased mobility, Decreased range of motion, Difficulty walking, Decreased strength, Increased edema, Impaired flexibility, Impaired perceived functional ability, Prosthetic Dependency, Postural dysfunction, Improper body mechanics, Pain  Visit Diagnosis: Unsteadiness on feet  Other abnormalities of gait and mobility  Muscle weakness (generalized)     Problem List Patient Active Problem List   Diagnosis Date Noted  . Pain due to onychomycosis of toenail of right foot 07/05/2018  . Abnormality of gait 10/12/2017  . Poorly controlled type 2 diabetes mellitus with peripheral neuropathy ([place])   . Flatulence   . Hypomagnesemia   . Unilateral complete BKA, right, sequela ([place])   . Benign essential HTN   . Hypoalbuminemia due to protein-calorie  malnutrition ([place])   . S/P below knee amputation, right ([place]) 04/12/2017  . Acute blood loss anemia   . Other encephalopathy 04/08/2017  . Encephalopathy 04/08/2017  . Altered mental status 04/08/2017  . Labile blood pressure   . Labile blood glucose   . Drug induced constipation   . Stage 3 chronic kidney disease ([place])   . Bacteremia   . S/P unilateral BKA (below knee amputation), right ([place]) 04/04/2017  . PAD (peripheral artery disease) ([place])   . Type 2 diabetes mellitus with right diabetic foot ulcer ([place])   . Post-operative pain   . Legally blind   . Upper GI bleed   . Streptococcal bacteremia 04/01/2017  . Hypokalemia 03/30/2017  . Uncontrolled type 2 diabetes mellitus with hyperglycemia, with long-term current use of insulin ([place]) 03/30/2017  . Type 2 diabetes mellitus with peripheral neuropathy ([place]) 03/30/2017  . AKI (acute kidney injury) ([place]) 03/30/2017  . CKD stage 3 due to type 2 diabetes mellitus ([place]) 03/30/2017  . Sepsis ([place]) 03/30/2017  . Heart murmur 11/22/2016  . Hyperlipidemia associated with type 2 diabetes mellitus ([place]) 11/22/2016  . Obstructive sleep apnea syndrome 11/22/2016  . Essential hypertension 12/17/2012   Lyndel Safe  PT, DPT, GCS  , 10/02/2018, 3:55  PM  [place] MAIN [facility] SERVICES [address] [place], [place], [address] Phone: [phone]   Fax:  [phone]  Name: CLEOTIS SPARR MRN: [id number] Date of Birth: May 04, 1955

## 2018-10-04 ENCOUNTER — Other Ambulatory Visit: Payer: Self-pay

## 2018-10-04 ENCOUNTER — Ambulatory Visit: Payer: Medicare Other

## 2018-10-04 VITALS — BP 152/77 | HR 62

## 2018-10-04 DIAGNOSIS — R2681 Unsteadiness on feet: Secondary | ICD-10-CM | POA: Diagnosis not present

## 2018-10-04 DIAGNOSIS — R2689 Other abnormalities of gait and mobility: Secondary | ICD-10-CM

## 2018-10-04 DIAGNOSIS — M6281 Muscle weakness (generalized): Secondary | ICD-10-CM

## 2018-10-04 NOTE — Therapy (Signed)
[place] MAIN [facility] SERVICES [address] [place], [place], [address] Phone: [phone]   Fax:  [phone]  Physical Therapy Treatment  Patient Details  Name: Trevor [name] MRN: [id number] Date of Birth: 07/20/1955 No data recorded  Encounter Date: 10/04/2018  PT End of Session - 10/04/18 1502    Visit Number  75    Number of Visits  91    Date for PT Re-Evaluation  11/22/18    Authorization Type  Eval 08/23/17; Last goals 08/30/18    PT Start Time  1430    PT Stop Time  1515    PT Time Calculation (min)  45 min    Equipment Utilized During Treatment  Gait belt   Rt BKA prosthesis   Activity Tolerance  Patient tolerated treatment well;Patient limited by fatigue    Behavior During Therapy  WFL for tasks assessed/performed       Past Medical History:  Diagnosis Date  . Diabetes mellitus without complication ([place])   . Heart murmur   . Hyperlipidemia   . Hypertension   . Sleep apnea     Past Surgical History:  Procedure Laterality Date  . AMPUTATION Right 03/31/2017   Procedure: RIGHT FOOT 1ST AND 2ND RAY AMPUTATION;  Surgeon: Newt Minion, MD;  Location: [place];  Service: Orthopedics;  Laterality: Right;  . AMPUTATION Right 04/01/2017   Procedure: AMPUTATION BELOW KNEE;  Surgeon: Newt Minion, MD;  Location: [place];  Service: Orthopedics;  Laterality: Right;  . COLONOSCOPY WITH PROPOFOL N/A 10/02/2017   Procedure: COLONOSCOPY WITH PROPOFOL;  Surgeon: [name], MD;  Location: [facility] ENDOSCOPY;  Service: Gastroenterology;  Laterality: N/A;  . CORNEAL TRANSPLANT      Vitals:   10/04/18 1437  BP: (!) 152/77  Pulse: 62  SpO2: 100%    Subjective Assessment - 10/04/18 1444    Subjective  Patient reports he is doing well today. He is having some plantar L foot pain upon arrival today. Reports that he doesn't know why it is hurting.    Pertinent History  63 year old right-handed male with history of diabetes mellitus,  hypertension, legally blind, and CKD presenting for BKA 04/01/17. Patient wears contacts and can see better now.  Patient had two surgeries, one was a couple toes then they decided to go below the knee. 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 Has ambulated with RW in hospital with PT. Patient has had home health therapy and was in inpatient rehab about a month. Patient reports he was mainly sitting down, with occasional walking, last home health therapy in May. Patient reports some standing up but no exercises. Patient lives alone in the house and is scared they might fall. Patient received prosthetic leg Tuesday of last week.    Limitations  Standing;Walking;House hold activities;Other (comment)    How long can you stand comfortably?  10-15 minutes    How long can you walk comfortably?  10-15 minutes    Patient Stated Goals  going up steps and driving car.     Currently in Pain?  Yes    Pain Score  4     Pain Location  Foot    Pain Orientation  Other (Comment)   Ventral   Pain Descriptors / Indicators  Aching    Pain Type  Acute pain    Pain Onset  In the past 7 days  Pain Frequency  Intermittent          TREATMENT   Ther-ex Examined L foot due to reports of pain on plantar surface. Pt has a fissure on the plantar surface of his midfoot slightly closer to his heel side. Fissure is approximately 1" in length. No discharge warmth, redness, or swelling noted. Pt also with multiple fissures along medial heel with dry, cracked skin. Pt encouraged to follow up with his PCP or podiatrist regarding this issue and monitor for signs of infection. Pt denies any chills, fever, body aches, or sweats; NuStep L1-3 x 5 minutes for cooldown at patient's request, fatigue and resistance monitored and adjusted throughout; 6" lateral step-ups without UE support x 10 each direction; Seated marches with 4# ankle weights x 15  each; Seated clams with manual resistance x 15; Seated adductor squeeze with manual resistance x 15; Seated R LAQ with 4# ankle weights x 15;   Neuromuscular Re-education  Airex 6" step taps alternating LE without UE support x 10 each; Staggered stance with one leg on 6" step and other foot on Airex with dynamic clock reaching alternating UE as called out by therapist x multiple bouts in each direction with each foot on Airex pad;   Pt educated throughout session about proper posture and technique with exercises. Improved exercise technique, movement at target joints, use of target muscles after min to mod verbal, visual, tactile cues.   Pt is making excellent progress with physical therapy and good motivation today. He requests to avoid walking today due to increased L plantar foot pain. Examined L foot and pt has a fissure on the plantar surface of his midfoot slightly closer to his heel side. Fissure is approximately 1" in length. No discharge warmth, redness, or swelling noted. Pt also with multiple fissures along medial heel with dry, cracked skin. Pt encouraged to follow up with his PCP or podiatrist regarding this issue and monitor for signs of infection. Pt denies any chills, fever, body aches, or sweats at this time. Worked on limited standing and seated exercises during session today to avoid prolonged standing on L foot. Encouraged pt to continue HEP and follow-up as scheduled.Pt will benefit from PT services to address deficits in strength, balance, and mobility in order to return to full function at home.                       PT Short Term Goals - 08/30/18 1701      PT SHORT TERM GOAL #1   Title  Patient will be independent in home exercise program to improve strength/mobility for better functional independence with ADLs.    Baseline  HEP given, 12/11/17: Performing daily:     Time  2    Period  Weeks    Status  Achieved      PT SHORT TERM GOAL #2    Title  Patient will don/doff prosthesis independently to allow for increased mobility in home.     Baseline  8/14: requires PT to guide/direct through process 8/28: independent    Time  2    Period  Weeks    Status  Achieved      PT SHORT TERM GOAL #3   Title  Patient will ambulate 10 meters with least assistive device and prosthesis to improve mobility in home.     Baseline  8/14: not walking outside of // bars yet  8/28: 63 seconds with RW and CGA  Time  2    Period  Weeks    Status  Achieved      PT SHORT TERM GOAL #4   Title  Patient will perform STS with single UE support to decrease reliance upon UE's for stability     Baseline  patient requires BUE support 11/5: 1 UR support    Time  2    Period  Weeks    Status  Achieved        PT Long Term Goals - 08/30/18 1701      PT LONG TERM GOAL #1   Title  Patient will ambulate >1045f during 634m with LRAD to demonstrate improved community ambulation and functional mobility    Baseline  11/5: will complete next session; 01/22/18: 400' in 3 minutes but had to stop secondary to pain in knee; 01/22/18: 400' in 3 minutes limited by R knee pain, 02/26/18: 500' in 3 minutes limited by low back pain; 06/07/18: Deferred due to new prosthetic; 07/30/18: 800' with SPC (500' after 3 min)- 2 rest breaks, and ended 30 seconds early due to knee and back pain 8/20 : 600 ft in 3 minutes and 20 seconds; terminated after this due to back pain    Time  12    Period  Weeks    Status  Partially Met    Target Date  11/22/18      PT LONG TERM GOAL #2   Title  Patient (> 606ears old) will complete five times sit to stand test in < 15 seconds indicating an increased LE strength and improved balance.    Baseline  8/14: 61 seconds 8/28; 42 seconds 9/17: 24 seconds BUE support with LLE 10/10: 17 seconds with excessive BUE support  11/5: 18seconds BUE support; 12/11/17: 12/11/17: 18.5s with BUE support but no AD, 01/22/18: 17.1s BUE support but no AD; 02/26/18:  13.0s with BUE, still unable to perform without UE assistance; 06/07/18: 25.4s with single UE support during first rep only; 07/30/18: 24.3s with single UE support during 1st rep only 8/20: 10 seconds BUE support, 16 seconds SUE support    Time  12    Period  Weeks    Status  Partially Met    Target Date  11/22/18      PT LONG TERM GOAL #3   Title  Patient will reduce timed up and go to <11 seconds to reduce fall risk and demonstrate improved transfer/gait ability.    Baseline  8/14: unable to perform 8/28: 1 min 30 seconds 9/17: 38 seconds RW 10/10: 25 seconds with RW 11/5: 20 seconds with cane;, 12/11/17: 17.4s with spc; 01/22/18: 14.8s with spc; 02/26/18: 13.0s with spc; 06/07/18: 16.1s with RW; 07/30/18: 12.44s with SPC 8/20: 11 seconds with SPC    Time  12    Period  Weeks    Status  Achieved      PT LONG TERM GOAL #4   Title  Patient will increase lower extremity functional scale to >60/80 to demonstrate improved functional mobility and increased tolerance with ADLs.     Baseline  814: 29/80 8/28: 20/80 10/10: 20/80 11/5: 22/80; 12/11/17: 18/80; 01/22/18: 27/80; 06/07/18: 30/80; 07/30/18: 35/80 8/20: 36/80    Time  12    Period  Weeks    Status  Partially Met    Target Date  11/22/18      PT LONG TERM GOAL #5   Title  Patient will perform 10 MWT in >1.0 m/s for improved mobility and  safety with negotiating natural environment    Baseline  9/17: .53 m/s 10/10: .63 m/s with RW 11/5: 0.7ms; 12/11/17: 15.33s = 0.65 m/s with spc; 01/22/18: self-selected: 14.4s = 0.69 m/s, fastest: 11.8s = 0.85 m/s; 02/26/18: self-selected: 13.3s = 0.75 m/s, fastest: 10.6s = 0.94 m/s; 06/07/18: self-selected: 14.0s = 0.71 m/s, fastest: 11.7s = 0.85 m/s; 07/30/18: self selected: 12.45s = 0.811m, fastest:8.84s = 1.1312m8/20: 1.1 m/s with SPC    Time  12    Period  Weeks    Status  Achieved      PT LONG TERM GOAL #6   Title  Patient will be mod I for negotiating curb with LRAD while wearing prosthetic for safe  community ambulation.    Baseline  8/20: patient not able to negotiate curbs    Time  12    Period  Weeks    Status  New    Target Date  11/22/18            Plan - 10/04/18 1503    Clinical Impression Statement  Pt is making excellent progress with physical therapy and good motivation today. He requests to avoid walking today due to increased L plantar foot pain. Examined L foot and pt has a fissure on the plantar surface of his midfoot slightly closer to his heel side. Fissure is approximately 1" in length. No discharge warmth, redness, or swelling noted. Pt also with multiple fissures along medial heel with dry, cracked skin. Pt encouraged to follow up with his PCP or podiatrist regarding this issue and monitor for signs of infection. Pt denies any chills, fever, body aches, or sweats at this time. Worked on limited standing and seated exercises during session today to avoid prolonged standing on L foot. Encouraged pt to continue HEP and follow-up as scheduled. Pt will benefit from PT services to address deficits in strength, balance, and mobility in order to return to full function at home.    Examination-Participation Restrictions  Interpersonal Relationship    Rehab Potential  Fair    Clinical Impairments Affecting Rehab Potential  (+) previous independence, motivation to return to walking (-) lives alone, hx of diabetes, limited vision     PT Frequency  2x / week    PT Duration  8 weeks    PT Treatment/Interventions  ADLs/Self Care Home Management;Cryotherapy;Electrical Stimulation;Ultrasound;Traction;Moist Heat;DME Instruction;Gait training;Stair training;Functional mobility training;Therapeutic activities;Therapeutic exercise;Patient/family education;Neuromuscular re-education;Balance training;Prosthetic Training;Wheelchair mobility training;Manual techniques;Manual lymph drainage;Compression bandaging;Taping;Energy conservation;Passive range of motion    PT Next Visit Plan  progression  of SLS on prosthetic limb,    PT Home Exercise Plan  see [id number] on medbridge, spc ambulation    Consulted and Agree with Plan of Care  Patient       Patient will benefit from skilled therapeutic intervention in order to improve the following deficits and impairments:  Abnormal gait, Decreased activity tolerance, Decreased balance, Decreased knowledge of precautions, Decreased endurance, Decreased knowledge of use of DME, Decreased mobility, Decreased range of motion, Difficulty walking, Decreased strength, Increased edema, Impaired flexibility, Impaired perceived functional ability, Prosthetic Dependency, Postural dysfunction, Improper body mechanics, Pain  Visit Diagnosis: Unsteadiness on feet  Other abnormalities of gait and mobility  Muscle weakness (generalized)     Problem List Patient Active Problem List   Diagnosis Date Noted  . Pain due to onychomycosis of toenail of right foot 07/05/2018  . Abnormality of gait 10/12/2017  . Poorly controlled type 2 diabetes mellitus with peripheral neuropathy ([place] . Flatulence   .  Hypomagnesemia   . Unilateral complete BKA, right, sequela ([place])   . Benign essential HTN   . Hypoalbuminemia due to protein-calorie malnutrition ([place])   . S/P below knee amputation, right ([place]) 04/12/2017  . Acute blood loss anemia   . Other encephalopathy 04/08/2017  . Encephalopathy 04/08/2017  . Altered mental status 04/08/2017  . Labile blood pressure   . Labile blood glucose   . Drug induced constipation   . Stage 3 chronic kidney disease ([place])   . Bacteremia   . S/P unilateral BKA (below knee amputation), right ([place]) 04/04/2017  . PAD (peripheral artery disease) ([place])   . Type 2 diabetes mellitus with right diabetic foot ulcer ([place])   . Post-operative pain   . Legally blind   . Upper GI bleed   . Streptococcal bacteremia 04/01/2017  . Hypokalemia 03/30/2017  . Uncontrolled type 2 diabetes mellitus with hyperglycemia, with long-term current  use of insulin ([place]) 03/30/2017  . Type 2 diabetes mellitus with peripheral neuropathy ([place]) 03/30/2017  . AKI (acute kidney injury) ([place]) 03/30/2017  . CKD stage 3 due to type 2 diabetes mellitus ([place]) 03/30/2017  . Sepsis ([place]) 03/30/2017  . Heart murmur 11/22/2016  . Hyperlipidemia associated with type 2 diabetes mellitus ([place]) 11/22/2016  . Obstructive sleep apnea syndrome 11/22/2016  . Essential hypertension 12/17/2012   Phillips Grout PT, DPT, GCS  Sabastien Tyler 10/04/2018, 5:37 PM  [place] MAIN [facility] SERVICES [address] [place], [place], [address] Phone: [phone]   Fax:  [phone]  Name: Trevor [name] MRN: [id number] Date of Birth: 18-Jun-1955

## 2018-10-09 ENCOUNTER — Ambulatory Visit: Payer: Medicare Other

## 2018-10-09 ENCOUNTER — Other Ambulatory Visit: Payer: Self-pay

## 2018-10-09 VITALS — BP 145/70 | HR 67

## 2018-10-09 DIAGNOSIS — M6281 Muscle weakness (generalized): Secondary | ICD-10-CM

## 2018-10-09 DIAGNOSIS — R2689 Other abnormalities of gait and mobility: Secondary | ICD-10-CM

## 2018-10-09 DIAGNOSIS — R2681 Unsteadiness on feet: Secondary | ICD-10-CM | POA: Diagnosis not present

## 2018-10-09 NOTE — Therapy (Signed)
[employer] MAIN [facility] SERVICES [address] [place], [place], [address] Phone: [phone]   Fax:  [phone]  Physical Therapy Treatment  Patient Details  Name: Trevor [name] MRN: [id number] Date of Birth: 1955/12/28 No data recorded  Encounter Date: 10/09/2018  PT End of Session - 10/09/18 1642    Visit Number  5    Number of Visits  91    Date for PT Re-Evaluation  11/22/18    Authorization Type  Eval 08/23/17; Last goals 08/30/18    PT Start Time  1430    PT Stop Time  1515    PT Time Calculation (min)  45 min    Equipment Utilized During Treatment  Gait belt   Rt BKA prosthesis   Activity Tolerance  Patient tolerated treatment well;Patient limited by fatigue    Behavior During Therapy  WFL for tasks assessed/performed       Past Medical History:  Diagnosis Date  . Diabetes mellitus without complication ([place])   . Heart murmur   . Hyperlipidemia   . Hypertension   . Sleep apnea     Past Surgical History:  Procedure Laterality Date  . AMPUTATION Right 03/31/2017   Procedure: RIGHT FOOT 1ST AND 2ND RAY AMPUTATION;  Surgeon: Newt Minion, MD;  Location: [place];  Service: Orthopedics;  Laterality: Right;  . AMPUTATION Right 04/01/2017   Procedure: AMPUTATION BELOW KNEE;  Surgeon: Newt Minion, MD;  Location: [place];  Service: Orthopedics;  Laterality: Right;  . COLONOSCOPY WITH PROPOFOL N/A 10/02/2017   Procedure: COLONOSCOPY WITH PROPOFOL;  Surgeon: [name], MD;  Location: [facility] ENDOSCOPY;  Service: Gastroenterology;  Laterality: N/A;  . CORNEAL TRANSPLANT      Vitals:   10/09/18 1438  BP: (!) 145/70  Pulse: 67  SpO2: 98%    Subjective Assessment - 10/09/18 1640    Subjective  Patient reports he is doing well today. No complaints of pain or soreness today or after last session.  No new questiosn or concerns at this time.    Pertinent History  63 year old right-handed male with history of diabetes mellitus,  hypertension, legally blind, and CKD presenting for BKA 04/01/17. Patient wears contacts and can see better now.  Patient had two surgeries, one was a couple toes then they decided to go below the knee. 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 Has ambulated with RW in hospital with PT. Patient has had home health therapy and was in inpatient rehab about a month. Patient reports he was mainly sitting down, with occasional walking, last home health therapy in May. Patient reports some standing up but no exercises. Patient lives alone in the house and is scared they might fall. Patient received prosthetic leg Tuesday of last week.    Limitations  Standing;Walking;House hold activities;Other (comment)    How long can you stand comfortably?  10-15 minutes    How long can you walk comfortably?  10-15 minutes    Patient Stated Goals  going up steps and driving car.     Currently in Pain?  No/denies    Pain Onset  --         TREATMENT    Ther-ex    6" lateral step-ups without UE support x 10 each direction;   Seated marches with 4# ankle weights x 15 each;   Seated clams with manual resistance x 15;  Seated adductor squeeze with manual resistance x 15;   Seated R LAQ with 4# ankle weights x 15;    Neuromuscular Re-education    1000' of gait with intermittent commands of head turns, speed changes, and quick stops 2 laps; intermittent breaks throughout due to fatigue. Fatigue monitored throughout distance  SPC utilized during 60-70% intervention.    Airex 6" step taps alternating LE without UE support x 10 each;         Pt educated throughout session about proper posture and technique with exercises. Improved exercise technique, movement at target joints, use of target muscles after min to mod verbal, visual, tactile cues.            Pt demonstrates good motivation throughout today's session. He was able to  complete 1000' of gait today with intermittent breaks throughout due to fatigue with CGA throughout with 1 LOB.  He continues to progress with strength and balance exercises each session.  Encouraged pt to continue HEP and follow-up as scheduled. Pt will benefit from PT services to address deficits in strength, balance, and mobility in order to return to full function at home.              PT Short Term Goals - 08/30/18 1701      PT SHORT TERM GOAL #1   Title  Patient will be independent in home exercise program to improve strength/mobility for better functional independence with ADLs.    Baseline  HEP given, 12/11/17: Performing daily:     Time  2    Period  Weeks    Status  Achieved      PT SHORT TERM GOAL #2   Title  Patient will don/doff prosthesis independently to allow for increased mobility in home.     Baseline  8/14: requires PT to guide/direct through process 8/28: independent    Time  2    Period  Weeks    Status  Achieved      PT SHORT TERM GOAL #3   Title  Patient will ambulate 10 meters with least assistive device and prosthesis to improve mobility in home.     Baseline  8/14: not walking outside of // bars yet  8/28: 63 seconds with RW and CGA     Time  2    Period  Weeks    Status  Achieved      PT SHORT TERM GOAL #4   Title  Patient will perform STS with single UE support to decrease reliance upon UE's for stability     Baseline  patient requires BUE support 11/5: 1 UR support    Time  2    Period  Weeks    Status  Achieved        PT Long Term Goals - 08/30/18 1701      PT LONG TERM GOAL #1   Title  Patient will ambulate >1044f during 68m with LRAD to demonstrate improved community ambulation and functional mobility    Baseline  11/5: will complete next session; 01/22/18: 400' in 3 minutes but had to stop secondary to pain in knee; 01/22/18: 400' in 3 minutes limited by R knee pain, 02/26/18: 500' in 3 minutes limited by low back pain; 06/07/18:  Deferred due to new prosthetic; 07/30/18: 800' with SPC (500' after 3 min)- 2 rest breaks, and ended 30 seconds early due to knee and back pain 8/20 : 600 ft in 3 minutes and 20 seconds; terminated after this due  to back pain    Time  12    Period  Weeks    Status  Partially Met    Target Date  11/22/18      PT LONG TERM GOAL #2   Title  Patient (> 55 years old) will complete five times sit to stand test in < 15 seconds indicating an increased LE strength and improved balance.    Baseline  8/14: 61 seconds 8/28; 42 seconds 9/17: 24 seconds BUE support with LLE 10/10: 17 seconds with excessive BUE support  11/5: 18seconds BUE support; 12/11/17: 12/11/17: 18.5s with BUE support but no AD, 01/22/18: 17.1s BUE support but no AD; 02/26/18: 13.0s with BUE, still unable to perform without UE assistance; 06/07/18: 25.4s with single UE support during first rep only; 07/30/18: 24.3s with single UE support during 1st rep only 8/20: 10 seconds BUE support, 16 seconds SUE support    Time  12    Period  Weeks    Status  Partially Met    Target Date  11/22/18      PT LONG TERM GOAL #3   Title  Patient will reduce timed up and go to <11 seconds to reduce fall risk and demonstrate improved transfer/gait ability.    Baseline  8/14: unable to perform 8/28: 1 min 30 seconds 9/17: 38 seconds RW 10/10: 25 seconds with RW 11/5: 20 seconds with cane;, 12/11/17: 17.4s with spc; 01/22/18: 14.8s with spc; 02/26/18: 13.0s with spc; 06/07/18: 16.1s with RW; 07/30/18: 12.44s with SPC 8/20: 11 seconds with SPC    Time  12    Period  Weeks    Status  Achieved      PT LONG TERM GOAL #4   Title  Patient will increase lower extremity functional scale to >60/80 to demonstrate improved functional mobility and increased tolerance with ADLs.     Baseline  814: 29/80 8/28: 20/80 10/10: 20/80 11/5: 22/80; 12/11/17: 18/80; 01/22/18: 27/80; 06/07/18: 30/80; 07/30/18: 35/80 8/20: 36/80    Time  12    Period  Weeks    Status  Partially Met    Target  Date  11/22/18      PT LONG TERM GOAL #5   Title  Patient will perform 10 MWT in >1.0 m/s for improved mobility and safety with negotiating natural environment    Baseline  9/17: .53 m/s 10/10: .63 m/s with RW 11/5: 0.25ms; 12/11/17: 15.33s = 0.65 m/s with spc; 01/22/18: self-selected: 14.4s = 0.69 m/s, fastest: 11.8s = 0.85 m/s; 02/26/18: self-selected: 13.3s = 0.75 m/s, fastest: 10.6s = 0.94 m/s; 06/07/18: self-selected: 14.0s = 0.71 m/s, fastest: 11.7s = 0.85 m/s; 07/30/18: self selected: 12.45s = 0.838m, fastest:8.84s = 1.1360m8/20: 1.1 m/s with SPC    Time  12    Period  Weeks    Status  Achieved      PT LONG TERM GOAL #6   Title  Patient will be mod I for negotiating curb with LRAD while wearing prosthetic for safe community ambulation.    Baseline  8/20: patient not able to negotiate curbs    Time  12    Period  Weeks    Status  New    Target Date  11/22/18            Plan - 10/09/18 1640    Clinical Impression Statement  Pt demonstrates good motivation throughout today's session. He was able to complete 1000' of gait today with intermittent breaks throughout due to fatigue with  CGA throughout with 1 LOB.  He continues to progress with strength and balance exercises each session.  Encouraged pt to continue HEP and follow-up as scheduled. Pt will benefit from PT services to address deficits in strength, balance, and mobility in order to return to full function at home.    Examination-Participation Restrictions  Interpersonal Relationship    Rehab Potential  Fair    Clinical Impairments Affecting Rehab Potential  (+) previous independence, motivation to return to walking (-) lives alone, hx of diabetes, limited vision     PT Frequency  2x / week    PT Duration  8 weeks    PT Treatment/Interventions  ADLs/Self Care Home Management;Cryotherapy;Electrical Stimulation;Ultrasound;Traction;Moist Heat;DME Instruction;Gait training;Stair training;Functional mobility training;Therapeutic  activities;Therapeutic exercise;Patient/family education;Neuromuscular re-education;Balance training;Prosthetic Training;Wheelchair mobility training;Manual techniques;Manual lymph drainage;Compression bandaging;Taping;Energy conservation;Passive range of motion    PT Next Visit Plan  progression of SLS on prosthetic limb,    PT Home Exercise Plan  see [id number] on medbridge, spc ambulation    Consulted and Agree with Plan of Care  Patient       Patient will benefit from skilled therapeutic intervention in order to improve the following deficits and impairments:  Abnormal gait, Decreased activity tolerance, Decreased balance, Decreased knowledge of precautions, Decreased endurance, Decreased knowledge of use of DME, Decreased mobility, Decreased range of motion, Difficulty walking, Decreased strength, Increased edema, Impaired flexibility, Impaired perceived functional ability, Prosthetic Dependency, Postural dysfunction, Improper body mechanics, Pain  Visit Diagnosis: Unsteadiness on feet  Other abnormalities of gait and mobility  Muscle weakness (generalized)     Problem List Patient Active Problem List   Diagnosis Date Noted  . Pain due to onychomycosis of toenail of right foot 07/05/2018  . Abnormality of gait 10/12/2017  . Poorly controlled type 2 diabetes mellitus with peripheral neuropathy ([place])   . Flatulence   . Hypomagnesemia   . Unilateral complete BKA, right, sequela ([place])   . Benign essential HTN   . Hypoalbuminemia due to protein-calorie malnutrition ([place])   . S/P below knee amputation, right ([place]) 04/12/2017  . Acute blood loss anemia   . Other encephalopathy 04/08/2017  . Encephalopathy 04/08/2017  . Altered mental status 04/08/2017  . Labile blood pressure   . Labile blood glucose   . Drug induced constipation   . Stage 3 chronic kidney disease ([place])   . Bacteremia   . S/P unilateral BKA (below knee amputation), right ([place]) 04/04/2017  . PAD (peripheral artery  disease) ([place])   . Type 2 diabetes mellitus with right diabetic foot ulcer ([place])   . Post-operative pain   . Legally blind   . Upper GI bleed   . Streptococcal bacteremia 04/01/2017  . Hypokalemia 03/30/2017  . Uncontrolled type 2 diabetes mellitus with hyperglycemia, with long-term current use of insulin ([place]) 03/30/2017  . Type 2 diabetes mellitus with peripheral neuropathy ([place]) 03/30/2017  . AKI (acute kidney injury) ([place]) 03/30/2017  . CKD stage 3 due to type 2 diabetes mellitus ([place]) 03/30/2017  . Sepsis ([place]) 03/30/2017  . Heart murmur 11/22/2016  . Hyperlipidemia associated with type 2 diabetes mellitus ([place]) 11/22/2016  . Obstructive sleep apnea syndrome 11/22/2016  . Essential hypertension 12/17/2012    This entire session was performed under direct supervision and direction of a licensed therapist/therapist assistant . I have personally read, edited and approve of the note as written.   Lutricia Horsfall, SPT Lyndel Safe Huprich PT, DPT, GCS  Huprich,Jason 10/10/2018, 1:37 PM  [facility]  [place], [place], [address] Phone: [phone]   Fax:  [phone]  Name: Trevor [name] MRN: [id number] Date of Birth: 12/11/1955

## 2018-10-11 ENCOUNTER — Ambulatory Visit: Payer: Medicare Other

## 2018-10-16 ENCOUNTER — Ambulatory Visit: Payer: Medicare Other

## 2018-10-18 ENCOUNTER — Ambulatory Visit: Payer: Medicare Other

## 2018-10-23 ENCOUNTER — Other Ambulatory Visit: Payer: Self-pay

## 2018-10-23 ENCOUNTER — Ambulatory Visit: Payer: Medicare Other | Attending: Physical Medicine & Rehabilitation

## 2018-10-23 DIAGNOSIS — S88111S Complete traumatic amputation at level between knee and ankle, right lower leg, sequela: Secondary | ICD-10-CM | POA: Diagnosis present

## 2018-10-23 DIAGNOSIS — R2689 Other abnormalities of gait and mobility: Secondary | ICD-10-CM

## 2018-10-23 DIAGNOSIS — R2681 Unsteadiness on feet: Secondary | ICD-10-CM | POA: Diagnosis not present

## 2018-10-23 DIAGNOSIS — M6281 Muscle weakness (generalized): Secondary | ICD-10-CM | POA: Diagnosis present

## 2018-10-23 NOTE — Therapy (Signed)
[place] MAIN [facility] SERVICES [address] [place], [place], [address] Phone: [phone]   Fax:  [phone]  Physical Therapy Treatment  Patient Details  Name: Trevor [name] MRN: [id number] Date of Birth: 1955-06-03 No data recorded  Encounter Date: 10/23/2018  PT End of Session - 10/23/18 1507    Visit Number  77    Number of Visits  91    Date for PT Re-Evaluation  11/22/18    Authorization Type  Eval 08/23/17; Last goals 08/30/18    PT Start Time  1431    PT Stop Time  1502    PT Time Calculation (min)  31 min    Equipment Utilized During Treatment  Gait belt   Rt BKA prosthesis   Activity Tolerance  Patient tolerated treatment well;Patient limited by fatigue;Other (comment)    Behavior During Therapy  WFL for tasks assessed/performed       Past Medical History:  Diagnosis Date  . Diabetes mellitus without complication ([place])   . Heart murmur   . Hyperlipidemia   . Hypertension   . Sleep apnea     Past Surgical History:  Procedure Laterality Date  . AMPUTATION Right 03/31/2017   Procedure: RIGHT FOOT 1ST AND 2ND RAY AMPUTATION;  Surgeon: Newt Minion, MD;  Location: [place];  Service: Orthopedics;  Laterality: Right;  . AMPUTATION Right 04/01/2017   Procedure: AMPUTATION BELOW KNEE;  Surgeon: Newt Minion, MD;  Location: [place];  Service: Orthopedics;  Laterality: Right;  . COLONOSCOPY WITH PROPOFOL N/A 10/02/2017   Procedure: COLONOSCOPY WITH PROPOFOL;  Surgeon: [name], MD;  Location: [facility] ENDOSCOPY;  Service: Gastroenterology;  Laterality: N/A;  . CORNEAL TRANSPLANT      There were no vitals filed for this visit.  Subjective Assessment - 10/23/18 1442    Subjective  Patient is returning from his trip [place]. While there he walked with his cane and prosthesis frequently.    Pertinent History  63 year old right-handed male with history of diabetes mellitus, hypertension, legally blind, and CKD presenting for  BKA 04/01/17. Patient wears contacts and can see better now.  Patient had two surgeries, one was a couple toes then they decided to go below the knee. 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 Has ambulated with RW in hospital with PT. Patient has had home health therapy and was in inpatient rehab about a month. Patient reports he was mainly sitting down, with occasional walking, last home health therapy in May. Patient reports some standing up but no exercises. Patient lives alone in the house and is scared they might fall. Patient received prosthetic leg Tuesday of last week.    Limitations  Standing;Walking;House hold activities;Other (comment)    How long can you stand comfortably?  10-15 minutes    How long can you walk comfortably?  10-15 minutes    Patient Stated Goals  going up steps and driving car.     Currently in Pain?  No/denies       Patient missed last week due to going to [place] with his church.    vitals:  First time:151/ 101 pulse 64  Second time: 164/88 pulse 63   Third time: 169/92 pulse 63    treatment:  6" step toe taps no UE support 15x each LE Lateral 6" step up no UE support 8x each side.  Bosu ball: flat  side up:  Modified lunges: with focus on pelvic tilt 10x  Each LE Balloon taps reaching inside/outside BOS 3 minutes while standing in // bars, no LOB   seated hamstring stretch on 6" step 30 seconds x2 trials     Pt educated throughout session about proper posture and technique with exercises. Improved exercise technique, movement at target joints, use of target muscles after min to mod verbal, visual, tactile cues.               PT Education - 10/23/18 1452    Education Details  exercise technique, monitoring vitals    Person(s) Educated  Patient    Methods  Explanation;Demonstration;Tactile cues;Verbal cues    Comprehension  Verbalized understanding;Returned  demonstration;Verbal cues required;Tactile cues required       PT Short Term Goals - 08/30/18 1701      PT SHORT TERM GOAL #1   Title  Patient will be independent in home exercise program to improve strength/mobility for better functional independence with ADLs.    Baseline  HEP given, 12/11/17: Performing daily:     Time  2    Period  Weeks    Status  Achieved      PT SHORT TERM GOAL #2   Title  Patient will don/doff prosthesis independently to allow for increased mobility in home.     Baseline  8/14: requires PT to guide/direct through process 8/28: independent    Time  2    Period  Weeks    Status  Achieved      PT SHORT TERM GOAL #3   Title  Patient will ambulate 10 meters with least assistive device and prosthesis to improve mobility in home.     Baseline  8/14: not walking outside of // bars yet  8/28: 63 seconds with RW and CGA     Time  2    Period  Weeks    Status  Achieved      PT SHORT TERM GOAL #4   Title  Patient will perform STS with single UE support to decrease reliance upon UE's for stability     Baseline  patient requires BUE support 11/5: 1 UR support    Time  2    Period  Weeks    Status  Achieved        PT Long Term Goals - 08/30/18 1701      PT LONG TERM GOAL #1   Title  Patient will ambulate >1011f during 642m with LRAD to demonstrate improved community ambulation and functional mobility    Baseline  11/5: will complete next session; 01/22/18: 400' in 3 minutes but had to stop secondary to pain in knee; 01/22/18: 400' in 3 minutes limited by R knee pain, 02/26/18: 500' in 3 minutes limited by low back pain; 06/07/18: Deferred due to new prosthetic; 07/30/18: 800' with SPC (500' after 3 min)- 2 rest breaks, and ended 30 seconds early due to knee and back pain 8/20 : 600 ft in 3 minutes and 20 seconds; terminated after this due to back pain    Time  12    Period  Weeks    Status  Partially Met    Target Date  11/22/18      PT LONG TERM GOAL #2   Title   Patient (> 6087ears old) will complete five times sit to stand test in < 15 seconds indicating an increased LE strength and improved balance.    Baseline  8/14:  61 seconds 8/28; 42 seconds 9/17: 24 seconds BUE support with LLE 10/10: 17 seconds with excessive BUE support  11/5: 18seconds BUE support; 12/11/17: 12/11/17: 18.5s with BUE support but no AD, 01/22/18: 17.1s BUE support but no AD; 02/26/18: 13.0s with BUE, still unable to perform without UE assistance; 06/07/18: 25.4s with single UE support during first rep only; 07/30/18: 24.3s with single UE support during 1st rep only 8/20: 10 seconds BUE support, 16 seconds SUE support    Time  12    Period  Weeks    Status  Partially Met    Target Date  11/22/18      PT LONG TERM GOAL #3   Title  Patient will reduce timed up and go to <11 seconds to reduce fall risk and demonstrate improved transfer/gait ability.    Baseline  8/14: unable to perform 8/28: 1 min 30 seconds 9/17: 38 seconds RW 10/10: 25 seconds with RW 11/5: 20 seconds with cane;, 12/11/17: 17.4s with spc; 01/22/18: 14.8s with spc; 02/26/18: 13.0s with spc; 06/07/18: 16.1s with RW; 07/30/18: 12.44s with SPC 8/20: 11 seconds with SPC    Time  12    Period  Weeks    Status  Achieved      PT LONG TERM GOAL #4   Title  Patient will increase lower extremity functional scale to >60/80 to demonstrate improved functional mobility and increased tolerance with ADLs.     Baseline  814: 29/80 8/28: 20/80 10/10: 20/80 11/5: 22/80; 12/11/17: 18/80; 01/22/18: 27/80; 06/07/18: 30/80; 07/30/18: 35/80 8/20: 36/80    Time  12    Period  Weeks    Status  Partially Met    Target Date  11/22/18      PT LONG TERM GOAL #5   Title  Patient will perform 10 MWT in >1.0 m/s for improved mobility and safety with negotiating natural environment    Baseline  9/17: .53 m/s 10/10: .63 m/s with RW 11/5: 0.5ms; 12/11/17: 15.33s = 0.65 m/s with spc; 01/22/18: self-selected: 14.4s = 0.69 m/s, fastest: 11.8s = 0.85 m/s; 02/26/18:  self-selected: 13.3s = 0.75 m/s, fastest: 10.6s = 0.94 m/s; 06/07/18: self-selected: 14.0s = 0.71 m/s, fastest: 11.7s = 0.85 m/s; 07/30/18: self selected: 12.45s = 0.841m, fastest:8.84s = 1.1368m8/20: 1.1 m/s with SPC    Time  12    Period  Weeks    Status  Achieved      PT LONG TERM GOAL #6   Title  Patient will be mod I for negotiating curb with LRAD while wearing prosthetic for safe community ambulation.    Baseline  8/20: patient not able to negotiate curbs    Time  12    Period  Weeks    Status  New    Target Date  11/22/18            Plan - 10/23/18 1508    Clinical Impression Statement  Patient session limited by patient leaving early as well as by patient having high diastolic pressure upon first reading. Patient vitals taken three times with only the first being abnormal for patient. Patient agreeable to call physician after session however declines going to physician/clinic at this time. Pt will benefit from PT services to address deficits in strength, balance, and mobility in order to return to full function at home.    Examination-Participation Restrictions  Interpersonal Relationship    Rehab Potential  Fair    Clinical Impairments Affecting Rehab Potential  (+) previous independence, motivation  to return to walking (-) lives alone, hx of diabetes, limited vision     PT Frequency  2x / week    PT Duration  8 weeks    PT Treatment/Interventions  ADLs/Self Care Home Management;Cryotherapy;Electrical Stimulation;Ultrasound;Traction;Moist Heat;DME Instruction;Gait training;Stair training;Functional mobility training;Therapeutic activities;Therapeutic exercise;Patient/family education;Neuromuscular re-education;Balance training;Prosthetic Training;Wheelchair mobility training;Manual techniques;Manual lymph drainage;Compression bandaging;Taping;Energy conservation;Passive range of motion    PT Next Visit Plan  progression of SLS on prosthetic limb,    PT Home Exercise Plan  see  [id number] on medbridge, spc ambulation    Consulted and Agree with Plan of Care  Patient       Patient will benefit from skilled therapeutic intervention in order to improve the following deficits and impairments:  Abnormal gait, Decreased activity tolerance, Decreased balance, Decreased knowledge of precautions, Decreased endurance, Decreased knowledge of use of DME, Decreased mobility, Decreased range of motion, Difficulty walking, Decreased strength, Increased edema, Impaired flexibility, Impaired perceived functional ability, Prosthetic Dependency, Postural dysfunction, Improper body mechanics, Pain  Visit Diagnosis: Unsteadiness on feet  Other abnormalities of gait and mobility  Muscle weakness (generalized)  Unilateral complete BKA, right, sequela ([place])     Problem List Patient Active Problem List   Diagnosis Date Noted  . Pain due to onychomycosis of toenail of right foot 07/05/2018  . Abnormality of gait 10/12/2017  . Poorly controlled type 2 diabetes mellitus with peripheral neuropathy ([place])   . Flatulence   . Hypomagnesemia   . Unilateral complete BKA, right, sequela ([place])   . Benign essential HTN   . Hypoalbuminemia due to protein-calorie malnutrition ([place])   . S/P below knee amputation, right ([place]) 04/12/2017  . Acute blood loss anemia   . Other encephalopathy 04/08/2017  . Encephalopathy 04/08/2017  . Altered mental status 04/08/2017  . Labile blood pressure   . Labile blood glucose   . Drug induced constipation   . Stage 3 chronic kidney disease   . Bacteremia   . S/P unilateral BKA (below knee amputation), right ([place]) 04/04/2017  . PAD (peripheral artery disease) ([place])   . Type 2 diabetes mellitus with right diabetic foot ulcer ([place])   . Post-operative pain   . Legally blind   . Upper GI bleed   . Streptococcal bacteremia 04/01/2017  . Hypokalemia 03/30/2017  . Uncontrolled type 2 diabetes mellitus with hyperglycemia, with long-term current use of insulin  ([place]) 03/30/2017  . Type 2 diabetes mellitus with peripheral neuropathy ([place]) 03/30/2017  . AKI (acute kidney injury) ([place]) 03/30/2017  . CKD stage 3 due to type 2 diabetes mellitus ([place]) 03/30/2017  . Sepsis ([place]) 03/30/2017  . Heart murmur 11/22/2016  . Hyperlipidemia associated with type 2 diabetes mellitus ([place]) 11/22/2016  . Obstructive sleep apnea syndrome 11/22/2016  . Essential hypertension 12/17/2012   Janna Arch, PT, DPT   10/23/2018, 3:09 PM  [place] MAIN [facility] SERVICES [address] [place], [place], [address] Phone: [phone]   Fax:  [phone]  Name: Trevor [name] MRN: [id number] Date of Birth: [birth year]

## 2018-10-25 ENCOUNTER — Encounter: Payer: Self-pay | Admitting: *Deleted

## 2018-10-25 ENCOUNTER — Other Ambulatory Visit: Payer: Self-pay

## 2018-10-25 ENCOUNTER — Emergency Department
Admission: EM | Admit: 2018-10-25 | Discharge: 2018-10-26 | Disposition: A | Discharge status: Home / Self Care | Payer: Medicare Other | Attending: Emergency Medicine | Admitting: Emergency Medicine

## 2018-10-25 ENCOUNTER — Ambulatory Visit: Payer: Medicare Other

## 2018-10-25 DIAGNOSIS — L84 Corns and callosities: Secondary | ICD-10-CM | POA: Insufficient documentation

## 2018-10-25 DIAGNOSIS — Z794 Long term (current) use of insulin: Secondary | ICD-10-CM | POA: Insufficient documentation

## 2018-10-25 DIAGNOSIS — N183 Chronic kidney disease, stage 3 unspecified: Secondary | ICD-10-CM | POA: Insufficient documentation

## 2018-10-25 DIAGNOSIS — Z89511 Acquired absence of right leg below knee: Secondary | ICD-10-CM | POA: Diagnosis not present

## 2018-10-25 DIAGNOSIS — Z79899 Other long term (current) drug therapy: Secondary | ICD-10-CM | POA: Diagnosis not present

## 2018-10-25 DIAGNOSIS — R609 Edema, unspecified: Secondary | ICD-10-CM | POA: Diagnosis not present

## 2018-10-25 DIAGNOSIS — R2242 Localized swelling, mass and lump, left lower limb: Secondary | ICD-10-CM | POA: Diagnosis present

## 2018-10-25 DIAGNOSIS — M6281 Muscle weakness (generalized): Secondary | ICD-10-CM

## 2018-10-25 DIAGNOSIS — R2681 Unsteadiness on feet: Secondary | ICD-10-CM

## 2018-10-25 DIAGNOSIS — M7989 Other specified soft tissue disorders: Secondary | ICD-10-CM

## 2018-10-25 DIAGNOSIS — E1122 Type 2 diabetes mellitus with diabetic chronic kidney disease: Secondary | ICD-10-CM | POA: Diagnosis not present

## 2018-10-25 DIAGNOSIS — I129 Hypertensive chronic kidney disease with stage 1 through stage 4 chronic kidney disease, or unspecified chronic kidney disease: Secondary | ICD-10-CM | POA: Insufficient documentation

## 2018-10-25 DIAGNOSIS — R2689 Other abnormalities of gait and mobility: Secondary | ICD-10-CM

## 2018-10-25 DIAGNOSIS — S88111S Complete traumatic amputation at level between knee and ankle, right lower leg, sequela: Secondary | ICD-10-CM

## 2018-10-25 LAB — BASIC METABOLIC PANEL
Anion gap: 9 (ref 5–15)
BUN: 23 mg/dL (ref 8–23)
CO2: 26 mmol/L (ref 22–32)
Calcium: 8.8 mg/dL — ABNORMAL LOW (ref 8.9–10.3)
Chloride: 102 mmol/L (ref 98–111)
Creatinine, Ser: 1.15 mg/dL (ref 0.61–1.24)
GFR calc Af Amer: >60 mL/min (ref >60)
GFR calc non Af Amer: >60 mL/min (ref >60)
Glucose, Bld: 313 mg/dL — ABNORMAL HIGH (ref 70–99)
Potassium: 3.8 mmol/L (ref 3.5–5.1)
Sodium: 137 mmol/L (ref 135–145)

## 2018-10-25 LAB — CBC
HCT: 43.9 % (ref 39.0–52.0)
Hemoglobin: 14.6 g/dL (ref 13.0–17.0)
MCH: 26.6 pg (ref 26.0–34.0)
MCHC: 33.3 g/dL (ref 30.0–36.0)
MCV: 80 fL (ref 80.0–100.0)
Platelets: 251 10*3/uL (ref 150–400)
RBC: 5.49 MIL/uL (ref 4.22–5.81)
RDW: 14 % (ref 11.5–15.5)
WBC: 8 10*3/uL (ref 4.0–10.5)
nRBC: 0 % (ref 0.0–0.2)

## 2018-10-25 MED ORDER — POTASSIUM CHLORIDE CRYS ER 20 MEQ PO TBCR: 20.0000 meq | EXTENDED_RELEASE_TABLET | Freq: Every day | ORAL | 0 refills | Status: DC

## 2018-10-25 MED ORDER — FUROSEMIDE 20 MG PO TABS: 20.0000 mg | ORAL_TABLET | Freq: Every day | ORAL | 0 refills | Status: DC

## 2018-10-25 NOTE — ED Triage Notes (Signed)
Pt to ED with Left foot and leg swelling. Right BKA. NO hx of CHF and no SOB reported but edema noted upon assessment. Pt reporting his skin has been "cracking" on his foot.

## 2018-10-25 NOTE — Discharge Instructions (Addendum)
As we discussed, you do not currently have an infection in your left foot where the skin has cracked through the callus, but it is very important that you follow-up with podiatry at the next available opportunity to discuss other treatments or medications that might help prevent infection.  In the meantime, I have started you back on a fluid pill (furosemide 20 mg by mouth once daily) because I think that the swelling (peripheral edema) that you are experiencing since coming off of your furosemide has led to the crack in the skin of the foot.  I also wrote you a prescription for a potassium supplement.  Please call your primary care doctor and see if you can schedule an appointment sooner than your existing one in 2 weeks, but if that is not possible, it is important you follow-up as planned.  They may wish to start you back on furosemide long-term or at least recheck some labs.  I provided the name and number of another local podiatrist if you are having trouble getting in touch with Dr. Prudence Davidson for a follow-up appointment.    In the meantime, when you are not active and are having that she has to rest, please try to keep your left leg elevated on a couple of pillows.  This should help with the swelling.  You may also consider getting compression stockings from a local pharmacy to help with the fluid.    Return to the emergency department if you develop new or worsening symptoms that concern you.

## 2018-10-25 NOTE — Therapy (Signed)
[place] MAIN [facility] SERVICES [address] [place], [place], [address] Phone: [phone]   Fax:  [phone]  Physical Therapy Treatment  Patient Details  Name: Trevor [name] MRN: [id number] Date of Birth: January 20, 1955 No data recorded  Encounter Date: 10/25/2018  PT End of Session - 10/25/18 1717    Visit Number  75    Number of Visits  91    Date for PT Re-Evaluation  11/22/18    Authorization Type  Eval 08/23/17; Last goals 08/30/18    PT Start Time  1430    PT Stop Time  1516    PT Time Calculation (min)  46 min    Equipment Utilized During Treatment  Gait belt   Rt BKA prosthesis   Activity Tolerance  Patient tolerated treatment well;Patient limited by fatigue;Other (comment)    Behavior During Therapy  WFL for tasks assessed/performed       Past Medical History:  Diagnosis Date  . Diabetes mellitus without complication ([place])   . Heart murmur   . Hyperlipidemia   . Hypertension   . Sleep apnea     Past Surgical History:  Procedure Laterality Date  . AMPUTATION Right 03/31/2017   Procedure: RIGHT FOOT 1ST AND 2ND RAY AMPUTATION;  Surgeon: Newt Minion, MD;  Location: [place];  Service: Orthopedics;  Laterality: Right;  . AMPUTATION Right 04/01/2017   Procedure: AMPUTATION BELOW KNEE;  Surgeon: Newt Minion, MD;  Location: [place];  Service: Orthopedics;  Laterality: Right;  . COLONOSCOPY WITH PROPOFOL N/A 10/02/2017   Procedure: COLONOSCOPY WITH PROPOFOL;  Surgeon: [name], MD;  Location: [facility] ENDOSCOPY;  Service: Gastroenterology;  Laterality: N/A;  . CORNEAL TRANSPLANT      There were no vitals filed for this visit.  Subjective Assessment - 10/25/18 1716    Subjective  Patient reports compliance with HEP, no falls or LOB since last session. Is accompanied by a friend.    Pertinent History  63 year old right-handed male with history of diabetes mellitus, hypertension, legally blind, and CKD presenting for BKA 04/01/17.  Patient wears contacts and can see better now.  Patient had two surgeries, one was a couple toes then they decided to go below the knee. 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 Has ambulated with RW in hospital with PT. Patient has had home health therapy and was in inpatient rehab about a month. Patient reports he was mainly sitting down, with occasional walking, last home health therapy in May. Patient reports some standing up but no exercises. Patient lives alone in the house and is scared they might fall. Patient received prosthetic leg Tuesday of last week.    Limitations  Standing;Walking;House hold activities;Other (comment)    How long can you stand comfortably?  10-15 minutes    How long can you walk comfortably?  10-15 minutes    Patient Stated Goals  going up steps and driving car.     Currently in Pain?  Yes    Pain Score  4     Pain Location  Foot    Pain Orientation  Left    Pain Descriptors / Indicators  Aching;Tender    Pain Type  Acute pain    Pain Onset  Other (comment)    Pain Frequency  Intermittent    Aggravating Factors   weightbearing    Pain Relieving Factors  none  TREATMENT   Vitals: 145/71 pulse 63     Ambulate 2 laps, each lap 1200 ft with one rest break per lap. First lap performed with good gait speed and no AD, equal arm swing, second lap requires use of SPC decreased arm swing of R arm with increased trunk flexion with fatigue; CGA with cueing for gait mechanics, weight shift, and body mechanics for functional ambulatory capacity.   Bottom of L foot:: examined: found to have small open wound, picture taken for patient to upload to portal, agreeable to call physician about open wound, Wound bandaged, sock donned, and shoe donned.  GTB hamstring curls 15x each side  GTB adduction 15x each side  IR/ER squeezing ball between LE's   Pt educated throughout session about proper  posture and technique with exercises. Improved exercise technique, movement at target joints, use of target muscles after min to mod verbal, visual, tactile cues.     Patient completed HEP  Pt will benefit from PT services to address deficits in strength, balance, and mobility in order to return to full function at home.                       PT Education - 10/25/18 1717    Education Details  informing physician of open wound on foot    Person(s) Educated  Patient    Methods  Explanation    Comprehension  Verbalized understanding       PT Short Term Goals - 08/30/18 1701      PT SHORT TERM GOAL #1   Title  Patient will be independent in home exercise program to improve strength/mobility for better functional independence with ADLs.    Baseline  HEP given, 12/11/17: Performing daily:     Time  2    Period  Weeks    Status  Achieved      PT SHORT TERM GOAL #2   Title  Patient will don/doff prosthesis independently to allow for increased mobility in home.     Baseline  8/14: requires PT to guide/direct through process 8/28: independent    Time  2    Period  Weeks    Status  Achieved      PT SHORT TERM GOAL #3   Title  Patient will ambulate 10 meters with least assistive device and prosthesis to improve mobility in home.     Baseline  8/14: not walking outside of // bars yet  8/28: 63 seconds with RW and CGA     Time  2    Period  Weeks    Status  Achieved      PT SHORT TERM GOAL #4   Title  Patient will perform STS with single UE support to decrease reliance upon UE's for stability     Baseline  patient requires BUE support 11/5: 1 UR support    Time  2    Period  Weeks    Status  Achieved        PT Long Term Goals - 08/30/18 1701      PT LONG TERM GOAL #1   Title  Patient will ambulate >1065f during 672m with LRAD to demonstrate improved community ambulation and functional mobility    Baseline  11/5: will complete next session; 01/22/18: 400' in 3  minutes but had to stop secondary to pain in knee; 01/22/18: 400' in 3 minutes limited by R knee pain, 02/26/18: 500' in 3 minutes limited by low back  pain; 06/07/18: Deferred due to new prosthetic; 07/30/18: 800' with SPC (500' after 3 min)- 2 rest breaks, and ended 30 seconds early due to knee and back pain 8/20 : 600 ft in 3 minutes and 20 seconds; terminated after this due to back pain    Time  12    Period  Weeks    Status  Partially Met    Target Date  11/22/18      PT LONG TERM GOAL #2   Title  Patient (> 75 years old) will complete five times sit to stand test in < 15 seconds indicating an increased LE strength and improved balance.    Baseline  8/14: 61 seconds 8/28; 42 seconds 9/17: 24 seconds BUE support with LLE 10/10: 17 seconds with excessive BUE support  11/5: 18seconds BUE support; 12/11/17: 12/11/17: 18.5s with BUE support but no AD, 01/22/18: 17.1s BUE support but no AD; 02/26/18: 13.0s with BUE, still unable to perform without UE assistance; 06/07/18: 25.4s with single UE support during first rep only; 07/30/18: 24.3s with single UE support during 1st rep only 8/20: 10 seconds BUE support, 16 seconds SUE support    Time  12    Period  Weeks    Status  Partially Met    Target Date  11/22/18      PT LONG TERM GOAL #3   Title  Patient will reduce timed up and go to <11 seconds to reduce fall risk and demonstrate improved transfer/gait ability.    Baseline  8/14: unable to perform 8/28: 1 min 30 seconds 9/17: 38 seconds RW 10/10: 25 seconds with RW 11/5: 20 seconds with cane;, 12/11/17: 17.4s with spc; 01/22/18: 14.8s with spc; 02/26/18: 13.0s with spc; 06/07/18: 16.1s with RW; 07/30/18: 12.44s with SPC 8/20: 11 seconds with SPC    Time  12    Period  Weeks    Status  Achieved      PT LONG TERM GOAL #4   Title  Patient will increase lower extremity functional scale to >60/80 to demonstrate improved functional mobility and increased tolerance with ADLs.     Baseline  814: 29/80 8/28: 20/80  10/10: 20/80 11/5: 22/80; 12/11/17: 18/80; 01/22/18: 27/80; 06/07/18: 30/80; 07/30/18: 35/80 8/20: 36/80    Time  12    Period  Weeks    Status  Partially Met    Target Date  11/22/18      PT LONG TERM GOAL #5   Title  Patient will perform 10 MWT in >1.0 m/s for improved mobility and safety with negotiating natural environment    Baseline  9/17: .53 m/s 10/10: .63 m/s with RW 11/5: 0.25ms; 12/11/17: 15.33s = 0.65 m/s with spc; 01/22/18: self-selected: 14.4s = 0.69 m/s, fastest: 11.8s = 0.85 m/s; 02/26/18: self-selected: 13.3s = 0.75 m/s, fastest: 10.6s = 0.94 m/s; 06/07/18: self-selected: 14.0s = 0.71 m/s, fastest: 11.7s = 0.85 m/s; 07/30/18: self selected: 12.45s = 0.875m, fastest:8.84s = 1.1328m8/20: 1.1 m/s with SPC    Time  12    Period  Weeks    Status  Achieved      PT LONG TERM GOAL #6   Title  Patient will be mod I for negotiating curb with LRAD while wearing prosthetic for safe community ambulation.    Baseline  8/20: patient not able to negotiate curbs    Time  12    Period  Weeks    Status  New    Target Date  11/22/18  Plan - 10/25/18 1719    Clinical Impression Statement  Patient's complaining of left foot pain with ambulation and weightbearing. Upon assessment patient has noted open wound and discoloration. Took picture of wound for patient to update physician on. Educated patient on need to inform physician today. Patient agreeable and stated he will do it after his session. Pt will benefit from PT services to address deficits in strength, balance, and mobility in order to return to full function at home.    Examination-Participation Restrictions  Interpersonal Relationship    Rehab Potential  Fair    Clinical Impairments Affecting Rehab Potential  (+) previous independence, motivation to return to walking (-) lives alone, hx of diabetes, limited vision     PT Frequency  2x / week    PT Duration  8 weeks    PT Treatment/Interventions  ADLs/Self Care Home  Management;Cryotherapy;Electrical Stimulation;Ultrasound;Traction;Moist Heat;DME Instruction;Gait training;Stair training;Functional mobility training;Therapeutic activities;Therapeutic exercise;Patient/family education;Neuromuscular re-education;Balance training;Prosthetic Training;Wheelchair mobility training;Manual techniques;Manual lymph drainage;Compression bandaging;Taping;Energy conservation;Passive range of motion    PT Next Visit Plan  progression of SLS on prosthetic limb,    PT Home Exercise Plan  see [id number] on medbridge, spc ambulation    Consulted and Agree with Plan of Care  Patient       Patient will benefit from skilled therapeutic intervention in order to improve the following deficits and impairments:  Abnormal gait, Decreased activity tolerance, Decreased balance, Decreased knowledge of precautions, Decreased endurance, Decreased knowledge of use of DME, Decreased mobility, Decreased range of motion, Difficulty walking, Decreased strength, Increased edema, Impaired flexibility, Impaired perceived functional ability, Prosthetic Dependency, Postural dysfunction, Improper body mechanics, Pain  Visit Diagnosis: Unsteadiness on feet  Other abnormalities of gait and mobility  Muscle weakness (generalized)  Unilateral complete BKA, right, sequela ([place])     Problem List Patient Active Problem List   Diagnosis Date Noted  . Pain due to onychomycosis of toenail of right foot 07/05/2018  . Abnormality of gait 10/12/2017  . Poorly controlled type 2 diabetes mellitus with peripheral neuropathy ([place])   . Flatulence   . Hypomagnesemia   . Unilateral complete BKA, right, sequela ([place])   . Benign essential HTN   . Hypoalbuminemia due to protein-calorie malnutrition ([place])   . S/P below knee amputation, right ([place]) 04/12/2017  . Acute blood loss anemia   . Other encephalopathy 04/08/2017  . Encephalopathy 04/08/2017  . Altered mental status 04/08/2017  . Labile blood pressure    . Labile blood glucose   . Drug induced constipation   . Stage 3 chronic kidney disease   . Bacteremia   . S/P unilateral BKA (below knee amputation), right ([place]) 04/04/2017  . PAD (peripheral artery disease) ([place])   . Type 2 diabetes mellitus with right diabetic foot ulcer ([place])   . Post-operative pain   . Legally blind   . Upper GI bleed   . Streptococcal bacteremia 04/01/2017  . Hypokalemia 03/30/2017  . Uncontrolled type 2 diabetes mellitus with hyperglycemia, with long-term current use of insulin ([place]) 03/30/2017  . Type 2 diabetes mellitus with peripheral neuropathy ([place]) 03/30/2017  . AKI (acute kidney injury) ([place]) 03/30/2017  . CKD stage 3 due to type 2 diabetes mellitus ([place]) 03/30/2017  . Sepsis ([place]) 03/30/2017  . Heart murmur 11/22/2016  . Hyperlipidemia associated with type 2 diabetes mellitus ([place]) 11/22/2016  . Obstructive sleep apnea syndrome 11/22/2016  . Essential hypertension 12/17/2012   Janna Arch, PT, DPT   10/25/2018, 5:20 PM  [place]  [place] MAIN [facility] SERVICES [place], [place], [address] Phone: [phone]   Fax:  [phone]  Name: Trevor [name] MRN: [id number] Date of Birth: [birth year]

## 2018-10-25 NOTE — ED Provider Notes (Signed)
[name] Emergency Department Provider Note  ____________________________________________   First MD Initiated Contact with Patient 10/25/18 2314     (approximate)  I have reviewed the triage vital signs and the nursing notes.   HISTORY  Chief Complaint Leg Swelling    HPI Trevor [name] is a 63 y.o. 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  He describes it as moderate in severity but he is concerned because his foot has swollen to the point that the thick callus at the bottom of the foot has cracked and he is concerned he might have gangrene.  He has some pain in the foot but it is chronic and constant and is no worse recently.  There has been no drainage nor purulence.  He denies fever/chills, sore throat, chest pain, shortness of breath, nausea, vomiting, and abdominal pain.  He reports that he urinates "all the time" even though he was taken off of his furosemide by his primary care doctor "a while ago".  He formally took furosemide 40 mg daily but he says they took him off because his potassium was too low.  Since that time he has had some swelling in his leg.  He has no history of blood clots.  He is ambulatory with a prosthetic leg when possible.  He does not elevate his left lower extremity when resting and he does not use compression stockings.  No contact with COVID-19 patients.       Past Medical History:  Diagnosis Date   Diabetes mellitus without complication ([place])    Heart murmur    Hyperlipidemia    Hypertension    Sleep apnea     Patient Active Problem List   Diagnosis Date Noted   Pain due to onychomycosis of toenail of right foot 07/05/2018   Abnormality of gait 10/12/2017   Poorly controlled type 2 diabetes mellitus  with peripheral neuropathy (HCC)    Flatulence    Hypomagnesemia    Unilateral complete BKA, right, sequela (HCC)    Benign essential HTN    Hypoalbuminemia due to protein-calorie malnutrition ([place])    S/P below knee amputation, right ([place]) 04/12/2017   Acute blood loss anemia    Other encephalopathy 04/08/2017   Encephalopathy 04/08/2017   Altered mental status 04/08/2017   Labile blood pressure    Labile blood glucose    Drug induced constipation    Stage 3 chronic kidney disease    Bacteremia    S/P unilateral BKA (below knee amputation), right ([place]) 04/04/2017   PAD (peripheral artery disease) ([place])    Type 2 diabetes mellitus with right diabetic foot ulcer ([place])    Post-operative pain    Legally blind    Upper GI bleed    Streptococcal bacteremia 04/01/2017   Hypokalemia 03/30/2017   Uncontrolled type 2 diabetes mellitus with hyperglycemia, with long-term current use of insulin ([place]) 03/30/2017   Type 2 diabetes mellitus with peripheral neuropathy ([place]) 03/30/2017   AKI (acute kidney injury) ([place]) 03/30/2017   CKD stage 3 due to type 2 diabetes mellitus ([place]) 03/30/2017   Sepsis ([place]) 03/30/2017   Heart murmur 11/22/2016   Hyperlipidemia associated with type 2 diabetes mellitus ([place]) 11/22/2016   Obstructive sleep apnea syndrome 11/22/2016   Essential hypertension 12/17/2012  Past Surgical History:  Procedure Laterality Date   AMPUTATION Right 03/31/2017   Procedure: RIGHT FOOT 1ST AND 2ND RAY AMPUTATION;  Surgeon: Newt Minion, MD;  Location: [facility];  Service: Orthopedics;  Laterality: Right;   AMPUTATION Right 04/01/2017   Procedure: AMPUTATION BELOW KNEE;  Surgeon: Newt Minion, MD;  Location: [facility];  Service: Orthopedics;  Laterality: Right;   COLONOSCOPY WITH PROPOFOL N/A 10/02/2017   Procedure: COLONOSCOPY WITH PROPOFOL;  Surgeon: [name], MD;  Location: [facility] ENDOSCOPY;  Service: Gastroenterology;  Laterality: N/A;    CORNEAL TRANSPLANT      Prior to Admission medications   Medication Sig Start Date End Date Taking? Authorizing Provider  ACCU-CHEK AVIVA PLUS test strip USE AS DIRECTED TO CHECK BLOOD SUGAR THREE TIMES A DAY 06/11/18   [provider]  acetaminophen (TYLENOL) 325 MG tablet Take 2 tablets (650 mg total) by mouth every 6 (six) hours as needed for mild pain (or Fever >/= 101). 04/12/17   Katherine Roan, MD  ammonium lactate (LAC-HYDRIN) 12 % cream Apply topically as needed for dry skin. 02/05/18   Gardiner Barefoot, DPM  atenolol (TENORMIN) 100 MG tablet Take by mouth daily.     [provider]  BD INSULIN SYRINGE U/F 31G X 5/16" 1 ML MISC  07/23/18   [provider]  furosemide (LASIX) 20 MG tablet Take 1 tablet (20 mg total) by mouth daily for 7 days. 10/25/18 11/01/18  Hinda Kehr, MD  glipiZIDE (GLUCOTROL) 10 MG tablet Take 10 mg by mouth 2 (two) times daily before a meal.    [provider]  insulin aspart (NOVOLOG) 100 UNIT/ML FlexPen Inject 10 Units into the skin 3 (three) times daily with meals. 04/27/17   Angiulli, Lavon Paganini, PA-C  insulin detemir (LEVEMIR) 100 unit/ml SOLN Inject 0.34 mLs (34 Units total) into the skin at bedtime. 04/27/17   Angiulli, Lavon Paganini, PA-C  losartan (COZAAR) 100 MG tablet Take 100 mg by mouth daily.    [provider]  metFORMIN (GLUCOPHAGE) 1000 MG tablet Take 1,000 mg by mouth 2 (two) times daily with a meal.     [provider]  NIFEdipine (PROCARDIA-XL/NIFEDICAL-XL) 30 MG 24 hr tablet Take 120 mg by mouth daily.    [provider]  polyethylene glycol (MIRALAX / GLYCOLAX) packet Take 17 g by mouth daily as needed for mild constipation. 04/04/17   Colbert Ewing, MD  potassium chloride SA (KLOR-CON M20) 20 MEQ tablet Take 1 tablet (20 mEq total) by mouth daily. 10/25/18   Hinda Kehr, MD  traMADol (ULTRAM) 50 MG tablet Take 0.5-1 tablets (25-50 mg total) by mouth every 8 (eight) hours as needed for  severe pain. 04/27/17   Angiulli, Lavon Paganini, PA-C    Allergies Enalapril  History reviewed. No pertinent family history.  Social History Social History   Tobacco Use   Smoking status: Never Smoker   Smokeless tobacco: Never Used  Substance Use Topics   Alcohol use: No   Drug use: Never    Review of Systems Constitutional: No fever/chills Eyes: No visual changes. ENT: No sore throat. Cardiovascular: Denies chest pain. Respiratory: Denies shortness of breath. Gastrointestinal: No abdominal pain.  No nausea, no vomiting.  No diarrhea.  No constipation. Genitourinary: Negative for dysuria. Musculoskeletal: Status post right BKA last year, now with gradually worsening left leg peripheral edema. Integumentary: Crack in callus in the bottom of the left foot. Neurological: Negative for headaches, focal weakness or numbness.   ____________________________________________  PHYSICAL EXAM:  VITAL SIGNS: ED Triage Vitals  Enc Vitals Group     BP 10/25/18 2038 (!) 160/82     Pulse Rate 10/25/18 2038 73     Resp 10/25/18 2038 16     Temp 10/25/18 2038 98.5 F (36.9 C)     Temp Source 10/25/18 2038 Oral     SpO2 10/25/18 2038 97 %     Weight 10/25/18 2039 129.3 kg (285 lb 0.9 oz)     Height 10/25/18 2039 1.93 m (6\' 4" )     Head Circumference --      Peak Flow --      Pain Score 10/25/18 2038 4     Pain Loc --      Pain Edu? --      Excl. in [place]? --     Constitutional: Alert and oriented.  No acute distress, watching a show comfortably on his portable device. Eyes: Conjunctivae are normal.  Head: Atraumatic. Nose: No congestion/rhinnorhea. Neck: No stridor.  No meningeal signs.   Cardiovascular: Normal rate, regular rhythm. Good peripheral circulation. Grossly normal heart sounds. Respiratory: Normal respiratory effort.  No retractions. Gastrointestinal: Obese.  Soft and nontender. No distention.  Musculoskeletal: Status post right BKA.  Left lower extremity has 1+  pitting edema from below the knee down to and including the left foot.  He has extensive chronic skin changes and thickening including thick calluses on the bottom of his left foot and several small areas that look to be chronic or healed wounds, but no active wounds and no active infection.  There is an area about 2 cm long roughly in the middle of the foot on the medial side that is a fissure that has developed within a thick callus.  There is no active drainage or purulence but it does appear acute though not infected.  There is no evidence of gangrene, cellulitis, etc.  There is minimal tenderness to palpation. Neurologic:  Normal speech and language. No gross focal neurologic deficits are appreciated.  Skin:  Skin is warm, dry and intact. Psychiatric: Mood and affect are normal. Speech and behavior are normal.  ____________________________________________   LABS (all labs ordered are listed, but only abnormal results are displayed)  Labs Reviewed  BASIC METABOLIC PANEL - Abnormal; Notable for the following components:      Result Value   Glucose, Bld 313 (*)    Calcium 8.8 (*)    All other components within normal limits  CBC   ____________________________________________  EKG  None - EKG not ordered by ED physician ____________________________________________  RADIOLOGY Ursula Alert, personally viewed and evaluated these images (plain radiographs) as part of my medical decision making, as well as reviewing the written report by the radiologist.  ED MD interpretation: No indication for imaging  Official radiology report(s): No results found.  ____________________________________________   PROCEDURES   Procedure(s) performed (including Critical Care):  Procedures   ____________________________________________   INITIAL IMPRESSION / MDM / [place] / ED COURSE  As part of my medical decision making, I reviewed the following data within the [place] notes reviewed and incorporated, Labs reviewed , Old chart reviewed and Notes from prior ED visits   Differential diagnosis includes, but is not limited to, chronic skin changes, peripheral edema in the setting of coming off of his diuretic, diabetic foot ulcer, gangrene, less likely DVT.  The patient has had slowly worsening symptoms over an extended period of time and  the acute issue that brought him in tonight is the fissure within the callus on the bottom of his left foot.  Fortunately there is no evidence of infection at this time and I encouraged him to continue using the topical ointments provided by his podiatrist.  We discussed the issue of diuresis and he is convinced he is no longer taking furosemide.  I could not find a note from his primary care provider since 2018 so I must take his word on this issue.  I think it is appropriate for him to be on a small amount of diuretic until he can follow-up with his PCP; he has an appointment scheduled within the next 2 weeks and I encouraged him to try to call and schedule a sooner appointment if possible.  I am giving him a weeks worth of furosemide 20 mg by mouth as well as a potassium supplement so he can begin the diuresis process to try to improve the peripheral edema and I counseled him about compression stockings and elevation.  I also provided him with additional follow-up information with another podiatrist because he said that his existing podiatrist, Dr. Prudence Davidson, told him he would need to follow-up with dermatology and he is not sure if he will see him again for this current issue.  I stressed to him that it is important to see a foot specialist to try to preserve the health and function of his existing foot.  I gave my usual and customary return precautions and explained why I do not think that antibiotics are appropriate or necessary at this time and he understands and agrees with the plan for outpatient follow-up.            ____________________________________________  FINAL CLINICAL IMPRESSION(S) / ED DIAGNOSES  Final diagnoses:  Peripheral edema  Left leg swelling  Foot callus     MEDICATIONS GIVEN DURING THIS VISIT:  Medications - No data to display   ED Discharge Orders         Ordered    furosemide (LASIX) 20 MG tablet  Daily     10/25/18 2353    potassium chloride SA (KLOR-CON M20) 20 MEQ tablet  Daily     10/25/18 2353          *Please note:  WALID LAMBERTI was evaluated in Emergency Department on 10/26/2018 for the symptoms described in the history of present illness. He was evaluated in the context of the global COVID-19 pandemic, which necessitated consideration that the patient might be at risk for infection with the SARS-CoV-2 virus that causes COVID-19. Institutional protocols and algorithms that pertain to the evaluation of patients at risk for COVID-19 are in a state of rapid change based on information released by regulatory bodies including the CDC and federal and state organizations. These policies and algorithms were followed during the patient's care in the ED.  Some ED evaluations and interventions may be delayed as a result of limited staffing during the pandemic.*  Note:  This document was prepared using Dragon voice recognition software and may include unintentional dictation errors.   Hinda Kehr, MD 10/26/18 [phone]

## 2018-10-30 ENCOUNTER — Other Ambulatory Visit: Payer: Self-pay

## 2018-10-30 ENCOUNTER — Ambulatory Visit: Payer: Medicare Other

## 2018-10-30 DIAGNOSIS — R2681 Unsteadiness on feet: Secondary | ICD-10-CM | POA: Diagnosis not present

## 2018-10-30 DIAGNOSIS — M6281 Muscle weakness (generalized): Secondary | ICD-10-CM

## 2018-10-30 DIAGNOSIS — S88111S Complete traumatic amputation at level between knee and ankle, right lower leg, sequela: Secondary | ICD-10-CM

## 2018-10-30 DIAGNOSIS — R2689 Other abnormalities of gait and mobility: Secondary | ICD-10-CM

## 2018-10-30 NOTE — Therapy (Signed)
[place] MAIN [facility] SERVICES [address] [place], [place], [address] Phone: [phone]   Fax:  [phone]  Physical Therapy Treatment  Patient Details  Name: Trevor [name] MRN: [id number] Date of Birth: 11/20/1955 No data recorded  Encounter Date: 10/30/2018  PT End of Session - 10/30/18 1756    Visit Number  82    Number of Visits  91    Date for PT Re-Evaluation  11/22/18    Authorization Type  Eval 08/23/17; Last goals 08/30/18    PT Start Time  1430    PT Stop Time  1510    PT Time Calculation (min)  40 min    Equipment Utilized During Treatment  Gait belt   Rt BKA prosthesis   Activity Tolerance  Patient tolerated treatment well;Patient limited by fatigue;Other (comment)    Behavior During Therapy  WFL for tasks assessed/performed       Past Medical History:  Diagnosis Date  . Diabetes mellitus without complication ([place])   . Heart murmur   . Hyperlipidemia   . Hypertension   . Sleep apnea     Past Surgical History:  Procedure Laterality Date  . AMPUTATION Right 03/31/2017   Procedure: RIGHT FOOT 1ST AND 2ND RAY AMPUTATION;  Surgeon: Newt Minion, MD;  Location: [place];  Service: Orthopedics;  Laterality: Right;  . AMPUTATION Right 04/01/2017   Procedure: AMPUTATION BELOW KNEE;  Surgeon: Newt Minion, MD;  Location: [place];  Service: Orthopedics;  Laterality: Right;  . COLONOSCOPY WITH PROPOFOL N/A 10/02/2017   Procedure: COLONOSCOPY WITH PROPOFOL;  Surgeon: [name], MD;  Location: [facility];  Service: Gastroenterology;  Laterality: N/A;  . CORNEAL TRANSPLANT      There were no vitals filed for this visit.  Subjective Assessment - 10/30/18 1438    Subjective  Patient reports he went to the doctor and now is on diuretics for his R foot.    Pertinent History  63 year old right-handed male with history of diabetes mellitus, hypertension, legally blind, and CKD presenting for BKA 04/01/17. Patient wears contacts  and can see better now.  Patient had two surgeries, one was a couple toes then they decided to go below the knee. 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 Has ambulated with RW in hospital with PT. Patient has had home health therapy and was in inpatient rehab about a month. Patient reports he was mainly sitting down, with occasional walking, last home health therapy in May. Patient reports some standing up but no exercises. Patient lives alone in the house and is scared they might fall. Patient received prosthetic leg Tuesday of last week.    Limitations  Standing;Walking;House hold activities;Other (comment)    How long can you stand comfortably?  10-15 minutes    How long can you walk comfortably?  10-15 minutes    Patient Stated Goals  going up steps and driving car.     Pain Onset  Other (comment)        Vitals: 141/73 pulse 64     ambulate >1000 ft with SPC, back pain increase to 5/10 with ambulation decreased arm swing of R arm with increased trunk flexion with fatigue; CGA with cueing for gait mechanics, weight shift, and body mechanics for functional ambulatory capacity.   seated: Roller to L calf 2 minutes for reduction   5lb ankle weight: -marches sitting  down cueing for decreased velocity and improved control/coordination, 15x each LE,  -LAQ , cueing for decreasing velocity of movement for optimal muscle recruitment, 15x each LE, cueing for neutral alignment of LLE.  -IR/ER 15x each side   -GTB hamstring curls 15x each side   -GTB adduction 15x each side   Pt educated throughout session about proper posture and technique with exercises. Improved exercise technique, movement at target joints, use of target muscles after min to mod verbal, visual, tactile cues.    Patient verbalized L calf pain with prolonged ambulation, pain relieved with use of roller for muscle tissue lengthening. Patient additionally  has back pain with ambulation limiting his ability to stand and perform interventions. Pt will benefit from PT services to address deficits in strength, balance, and mobility in order to return to full function at home.             PT Education - 10/30/18 1503    Education Details  exercise technique, body mechanics    Person(s) Educated  Patient    Methods  Explanation;Demonstration;Tactile cues;Verbal cues    Comprehension  Verbalized understanding;Returned demonstration;Verbal cues required;Tactile cues required       PT Short Term Goals - 08/30/18 1701      PT SHORT TERM GOAL #1   Title  Patient will be independent in home exercise program to improve strength/mobility for better functional independence with ADLs.    Baseline  HEP given, 12/11/17: Performing daily:     Time  2    Period  Weeks    Status  Achieved      PT SHORT TERM GOAL #2   Title  Patient will don/doff prosthesis independently to allow for increased mobility in home.     Baseline  8/14: requires PT to guide/direct through process 8/28: independent    Time  2    Period  Weeks    Status  Achieved      PT SHORT TERM GOAL #3   Title  Patient will ambulate 10 meters with least assistive device and prosthesis to improve mobility in home.     Baseline  8/14: not walking outside of // bars yet  8/28: 63 seconds with RW and CGA     Time  2    Period  Weeks    Status  Achieved      PT SHORT TERM GOAL #4   Title  Patient will perform STS with single UE support to decrease reliance upon UE's for stability     Baseline  patient requires BUE support 11/5: 1 UR support    Time  2    Period  Weeks    Status  Achieved        PT Long Term Goals - 08/30/18 1701      PT LONG TERM GOAL #1   Title  Patient will ambulate >1075f during 674m with LRAD to demonstrate improved community ambulation and functional mobility    Baseline  11/5: will complete next session; 01/22/18: 400' in 3 minutes but had to stop  secondary to pain in knee; 01/22/18: 400' in 3 minutes limited by R knee pain, 02/26/18: 500' in 3 minutes limited by low back pain; 06/07/18: Deferred due to new prosthetic; 07/30/18: 800' with SPC (500' after 3 min)- 2 rest breaks, and ended 30 seconds early due to knee and back pain 8/20 : 600 ft in 3 minutes and 20 seconds; terminated after this due to back pain  Time  12    Period  Weeks    Status  Partially Met    Target Date  11/22/18      PT LONG TERM GOAL #2   Title  Patient (> 73 years old) will complete five times sit to stand test in < 15 seconds indicating an increased LE strength and improved balance.    Baseline  8/14: 61 seconds 8/28; 42 seconds 9/17: 24 seconds BUE support with LLE 10/10: 17 seconds with excessive BUE support  11/5: 18seconds BUE support; 12/11/17: 12/11/17: 18.5s with BUE support but no AD, 01/22/18: 17.1s BUE support but no AD; 02/26/18: 13.0s with BUE, still unable to perform without UE assistance; 06/07/18: 25.4s with single UE support during first rep only; 07/30/18: 24.3s with single UE support during 1st rep only 8/20: 10 seconds BUE support, 16 seconds SUE support    Time  12    Period  Weeks    Status  Partially Met    Target Date  11/22/18      PT LONG TERM GOAL #3   Title  Patient will reduce timed up and go to <11 seconds to reduce fall risk and demonstrate improved transfer/gait ability.    Baseline  8/14: unable to perform 8/28: 1 min 30 seconds 9/17: 38 seconds RW 10/10: 25 seconds with RW 11/5: 20 seconds with cane;, 12/11/17: 17.4s with spc; 01/22/18: 14.8s with spc; 02/26/18: 13.0s with spc; 06/07/18: 16.1s with RW; 07/30/18: 12.44s with SPC 8/20: 11 seconds with SPC    Time  12    Period  Weeks    Status  Achieved      PT LONG TERM GOAL #4   Title  Patient will increase lower extremity functional scale to >60/80 to demonstrate improved functional mobility and increased tolerance with ADLs.     Baseline  814: 29/80 8/28: 20/80 10/10: 20/80 11/5: 22/80;  12/11/17: 18/80; 01/22/18: 27/80; 06/07/18: 30/80; 07/30/18: 35/80 8/20: 36/80    Time  12    Period  Weeks    Status  Partially Met    Target Date  11/22/18      PT LONG TERM GOAL #5   Title  Patient will perform 10 MWT in >1.0 m/s for improved mobility and safety with negotiating natural environment    Baseline  9/17: .53 m/s 10/10: .63 m/s with RW 11/5: 0.91ms; 12/11/17: 15.33s = 0.65 m/s with spc; 01/22/18: self-selected: 14.4s = 0.69 m/s, fastest: 11.8s = 0.85 m/s; 02/26/18: self-selected: 13.3s = 0.75 m/s, fastest: 10.6s = 0.94 m/s; 06/07/18: self-selected: 14.0s = 0.71 m/s, fastest: 11.7s = 0.85 m/s; 07/30/18: self selected: 12.45s = 0.832m, fastest:8.84s = 1.1318m8/20: 1.1 m/s with SPC    Time  12    Period  Weeks    Status  Achieved      PT LONG TERM GOAL #6   Title  Patient will be mod I for negotiating curb with LRAD while wearing prosthetic for safe community ambulation.    Baseline  8/20: patient not able to negotiate curbs    Time  12    Period  Weeks    Status  New    Target Date  11/22/18            Plan - 10/30/18 1758    Clinical Impression Statement  Patient verbalized L calf pain with prolonged ambulation, pain relieved with use of roller for muscle tissue lengthening. Patient additionally has back pain with ambulation limiting his ability to stand  and perform interventions. Pt will benefit from PT services to address deficits in strength, balance, and mobility in order to return to full function at home.    Examination-Participation Restrictions  Interpersonal Relationship    Rehab Potential  Fair    Clinical Impairments Affecting Rehab Potential  (+) previous independence, motivation to return to walking (-) lives alone, hx of diabetes, limited vision     PT Frequency  2x / week    PT Duration  8 weeks    PT Treatment/Interventions  ADLs/Self Care Home Management;Cryotherapy;Electrical Stimulation;Ultrasound;Traction;Moist Heat;DME Instruction;Gait training;Stair  training;Functional mobility training;Therapeutic activities;Therapeutic exercise;Patient/family education;Neuromuscular re-education;Balance training;Prosthetic Training;Wheelchair mobility training;Manual techniques;Manual lymph drainage;Compression bandaging;Taping;Energy conservation;Passive range of motion    PT Next Visit Plan  progression of SLS on prosthetic limb,    PT Home Exercise Plan  see [id number] on medbridge, spc ambulation    Consulted and Agree with Plan of Care  Patient       Patient will benefit from skilled therapeutic intervention in order to improve the following deficits and impairments:  Abnormal gait, Decreased activity tolerance, Decreased balance, Decreased knowledge of precautions, Decreased endurance, Decreased knowledge of use of DME, Decreased mobility, Decreased range of motion, Difficulty walking, Decreased strength, Increased edema, Impaired flexibility, Impaired perceived functional ability, Prosthetic Dependency, Postural dysfunction, Improper body mechanics, Pain  Visit Diagnosis: Unsteadiness on feet  Other abnormalities of gait and mobility  Muscle weakness (generalized)  Unilateral complete BKA, right, sequela ([place])     Problem List Patient Active Problem List   Diagnosis Date Noted  . Pain due to onychomycosis of toenail of right foot 07/05/2018  . Abnormality of gait 10/12/2017  . Poorly controlled type 2 diabetes mellitus with peripheral neuropathy ([place])   . Flatulence   . Hypomagnesemia   . Unilateral complete BKA, right, sequela ([place])   . Benign essential HTN   . Hypoalbuminemia due to protein-calorie malnutrition ([place])   . S/P below knee amputation, right ([place]) 04/12/2017  . Acute blood loss anemia   . Other encephalopathy 04/08/2017  . Encephalopathy 04/08/2017  . Altered mental status 04/08/2017  . Labile blood pressure   . Labile blood glucose   . Drug induced constipation   . Stage 3 chronic kidney disease   . Bacteremia   .  S/P unilateral BKA (below knee amputation), right ([place]) 04/04/2017  . PAD (peripheral artery disease) ([place])   . Type 2 diabetes mellitus with right diabetic foot ulcer ([place])   . Post-operative pain   . Legally blind   . Upper GI bleed   . Streptococcal bacteremia 04/01/2017  . Hypokalemia 03/30/2017  . Uncontrolled type 2 diabetes mellitus with hyperglycemia, with long-term current use of insulin ([place]) 03/30/2017  . Type 2 diabetes mellitus with peripheral neuropathy ([place]) 03/30/2017  . AKI (acute kidney injury) ([place]) 03/30/2017  . CKD stage 3 due to type 2 diabetes mellitus ([place]) 03/30/2017  . Sepsis ([place]) 03/30/2017  . Heart murmur 11/22/2016  . Hyperlipidemia associated with type 2 diabetes mellitus ([place]) 11/22/2016  . Obstructive sleep apnea syndrome 11/22/2016  . Essential hypertension 12/17/2012    Janna Arch, PT, DPT   10/30/2018, 5:59 PM  [place] MAIN [facility] SERVICES [address] [place], [place], [address] Phone: [phone]   Fax:  [phone]  Name: Trevor [name] MRN: [id number] Date of Birth: January 19, 1955

## 2018-11-01 ENCOUNTER — Ambulatory Visit: Payer: Self-pay | Admitting: Physical Medicine & Rehabilitation

## 2018-11-01 ENCOUNTER — Ambulatory Visit: Payer: Medicare Other

## 2018-11-01 ENCOUNTER — Other Ambulatory Visit: Payer: Self-pay

## 2018-11-01 DIAGNOSIS — R2689 Other abnormalities of gait and mobility: Secondary | ICD-10-CM

## 2018-11-01 DIAGNOSIS — S88111S Complete traumatic amputation at level between knee and ankle, right lower leg, sequela: Secondary | ICD-10-CM

## 2018-11-01 DIAGNOSIS — R2681 Unsteadiness on feet: Secondary | ICD-10-CM

## 2018-11-01 DIAGNOSIS — M6281 Muscle weakness (generalized): Secondary | ICD-10-CM

## 2018-11-01 NOTE — Therapy (Signed)
[place] MAIN [facility] SERVICES [address] [place], [place], [address] Phone: [phone]   Fax:  [phone]  Physical Therapy Treatment Physical Therapy Progress Note   Dates of reporting period  08/30/18   to   11/01/18  Patient Details  Name: Trevor [name] MRN: [id number] Date of Birth: 04-17-1955 No data recorded  Encounter Date: 11/01/2018  PT End of Session - 11/01/18 1448    Visit Number  80    Number of Visits  91    Date for PT Re-Evaluation  11/22/18    Authorization Type  next session 1/10 PN 10/22/    PT Start Time  1438    PT Stop Time  1513    PT Time Calculation (min)  35 min    Equipment Utilized During Treatment  Gait belt   Rt BKA prosthesis   Activity Tolerance  Patient tolerated treatment well;Patient limited by fatigue;Other (comment)    Behavior During Therapy  WFL for tasks assessed/performed       Past Medical History:  Diagnosis Date  . Diabetes mellitus without complication ([place])   . Heart murmur   . Hyperlipidemia   . Hypertension   . Sleep apnea     Past Surgical History:  Procedure Laterality Date  . AMPUTATION Right 03/31/2017   Procedure: RIGHT FOOT 1ST AND 2ND RAY AMPUTATION;  Surgeon: Newt Minion, MD;  Location: [place];  Service: Orthopedics;  Laterality: Right;  . AMPUTATION Right 04/01/2017   Procedure: AMPUTATION BELOW KNEE;  Surgeon: Newt Minion, MD;  Location: [place];  Service: Orthopedics;  Laterality: Right;  . COLONOSCOPY WITH PROPOFOL N/A 10/02/2017   Procedure: COLONOSCOPY WITH PROPOFOL;  Surgeon: [name], MD;  Location[facility] END[facility]COPY;  Service: Gastroenterology;  Laterality: N/A;  . CORNEAL TRANSPLANT      There were no vitals filed for this visit.  Subjective Assessment - 11/01/18 1447    Subjective  Patient arrived late limiting session duration. Reports minimal pain at beginning of session just fatigue. No falls or LOB since last session.    Pertinent History   63 year old right-handed male with history of diabetes mellitus, hypertension, legally blind, and CKD presenting for BKA 04/01/17. Patient wears contacts and can see better now.  Patient had two surgeries, one was a couple toes then they decided to go below the knee. 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 Has ambulated with RW in hospital with PT. Patient has had home health therapy and was in inpatient rehab about a month. Patient reports he was mainly sitting down, with occasional walking, last home health therapy in May. Patient reports some standing up but no exercises. Patient lives alone in the house and is scared they might fall. Patient received prosthetic leg Tuesday of last week.    Limitations  Standing;Walking;House hold activities;Other (comment)    How long can you stand comfortably?  10-15 minutes    How long can you walk comfortably?  10-15 minutes    Patient Stated Goals  going up steps and driving car.     Currently in Pain?  No/denies               Vitals: 147/76 pulse 64   Goals:  6 MWT: 635 ft  5x STS : 15 seconds, 3 without UE support, 2 with UE support.  LEFS: 36/80  Negotiate curb : able  to negotiate per patient report while he was on his trip with [facility]    Treat:  5lb ankle weights: Seated single limb march focus on height of arc of motion, slow velocity for optimal muscle recruitment. 10x each LE; x2 trials  LAQ: cueing for decreased speed for improved body mechanics 10x each LE; x2 trials   GTB hamstring curls 10x each LE   Patient's condition has the potential to improve in response to therapy. Maximum improvement is yet to be obtained. The anticipated improvement is attainable and reasonable in a generally predictable time.  Patient reports he is able to walk with his cane. His back continues to limit his walking distances.           PT Education - 11/01/18 1448    Education  Details  goals, exercise technique    Person(s) Educated  Patient    Methods  Explanation;Demonstration;Tactile cues;Verbal cues    Comprehension  Verbalized understanding;Returned demonstration;Verbal cues required;Tactile cues required       PT Short Term Goals - 08/30/18 1701      PT SHORT TERM GOAL #1   Title  Patient will be independent in home exercise program to improve strength/mobility for better functional independence with ADLs.    Baseline  HEP given, 12/11/17: Performing daily:     Time  2    Period  Weeks    Status  Achieved      PT SHORT TERM GOAL #2   Title  Patient will don/doff prosthesis independently to allow for increased mobility in home.     Baseline  8/14: requires PT to guide/direct through process 8/28: independent    Time  2    Period  Weeks    Status  Achieved      PT SHORT TERM GOAL #3   Title  Patient will ambulate 10 meters with least assistive device and prosthesis to improve mobility in home.     Baseline  8/14: not walking outside of // bars yet  8/28: 63 seconds with RW and CGA     Time  2    Period  Weeks    Status  Achieved      PT SHORT TERM GOAL #4   Title  Patient will perform STS with single UE support to decrease reliance upon UE's for stability     Baseline  patient requires BUE support 11/5: 1 UR support    Time  2    Period  Weeks    Status  Achieved        PT Long Term Goals - 11/01/18 1504      PT LONG TERM GOAL #1   Title  Patient will ambulate >1031f during 669m with LRAD to demonstrate improved community ambulation and functional mobility    Baseline  11/5: will complete next session; 01/22/18: 400' in 3 minutes but had to stop secondary to pain in knee; 01/22/18: 400' in 3 minutes limited by R knee pain, 02/26/18: 500' in 3 minutes limited by low back pain; 06/07/18: Deferred due to new prosthetic; 07/30/18: 800' with SPC (500' after 3 min)- 2 rest breaks, and ended 30 seconds early due to knee and back pain 8/20 : 600 ft in 3  minutes and 20 seconds; terminated after this due to back pain 10/22: 635 ft with SPC, terminated early due to back pain    Time  12    Period  Weeks    Status  Partially Met  Target Date  11/22/18      PT LONG TERM GOAL #2   Title  Patient (> 52 years old) will complete five times sit to stand test in < 15 seconds indicating an increased LE strength and improved balance.    Baseline  8/14: 61 seconds 8/28; 42 seconds 9/17: 24 seconds BUE support with LLE 10/10: 17 seconds with excessive BUE support  11/5: 18seconds BUE support; 12/11/17: 12/11/17: 18.5s with BUE support but no AD, 01/22/18: 17.1s BUE support but no AD; 02/26/18: 13.0s with BUE, still unable to perform without UE assistance; 06/07/18: 25.4s with single UE support during first rep only; 07/30/18: 24.3s with single UE support during 1st rep only 8/20: 10 seconds BUE support, 16 seconds SUE support 10/22: 15 seconds: 3 with no UE support, 2 with UE support due to one Posterior LOB.    Time  12    Period  Weeks    Status  Partially Met    Target Date  11/22/18      PT LONG TERM GOAL #3   Title  Patient will reduce timed up and go to <11 seconds to reduce fall risk and demonstrate improved transfer/gait ability.    Baseline  8/14: unable to perform 8/28: 1 min 30 seconds 9/17: 38 seconds RW 10/10: 25 seconds with RW 11/5: 20 seconds with cane;, 12/11/17: 17.4s with spc; 01/22/18: 14.8s with spc; 02/26/18: 13.0s with spc; 06/07/18: 16.1s with RW; 07/30/18: 12.44s with SPC 8/20: 11 seconds with SPC    Time  12    Period  Weeks    Status  Achieved      PT LONG TERM GOAL #4   Title  Patient will increase lower extremity functional scale to >60/80 to demonstrate improved functional mobility and increased tolerance with ADLs.     Baseline  814: 29/80 8/28: 20/80 10/10: 20/80 11/5: 22/80; 12/11/17: 18/80; 01/22/18: 27/80; 06/07/18: 30/80; 07/30/18: 35/80 8/20: 36/80 10/22: 36/80    Time  12    Period  Weeks    Status  Partially Met    Target Date   11/22/18      PT LONG TERM GOAL #5   Title  Patient will perform 10 MWT in >1.0 m/s for improved mobility and safety with negotiating natural environment    Baseline  9/17: .53 m/s 10/10: .63 m/s with RW 11/5: 0.49ms; 12/11/17: 15.33s = 0.65 m/s with spc; 01/22/18: self-selected: 14.4s = 0.69 m/s, fastest: 11.8s = 0.85 m/s; 02/26/18: self-selected: 13.3s = 0.75 m/s, fastest: 10.6s = 0.94 m/s; 06/07/18: self-selected: 14.0s = 0.71 m/s, fastest: 11.7s = 0.85 m/s; 07/30/18: self selected: 12.45s = 0.8100m, fastest:8.84s = 1.1380m8/20: 1.1 m/s with SPC    Time  12    Period  Weeks    Status  Achieved      PT LONG TERM GOAL #6   Title  Patient will be mod I for negotiating curb with LRAD while wearing prosthetic for safe community ambulation.    Baseline  8/20: patient not able to negotiate curbs 10/22: [facility] curl [facility]    Time  12    Period  Weeks    Status  Achieved            Plan - 11/01/18 1523    Clinical Impression Statement  Patient is progressing with ambulatory capacity however is limited by back pain from further distances at this time. Patient initially progressing with sit to stand transfer until  excessive weight shift resulted in near LOB. LEFS score has remained the same.  Patient's condition has the potential to improve in response to therapy. Maximum improvement is yet to be obtained. The anticipated improvement is attainable and reasonable in a generally predictable time. Pt will benefit from PT services to address deficits in strength, balance, and mobility in order to return to full function at home    Examination-Participation Restrictions  Interpersonal Relationship    Rehab Potential  Fair    Clinical Impairments Affecting Rehab Potential  (+) previous independence, motivation to return to walking (-) lives alone, hx of diabetes, limited vision     PT Frequency  2x / week    PT Duration  8 weeks    PT Treatment/Interventions  ADLs/Self  Care Home Management;Cryotherapy;Electrical Stimulation;Ultrasound;Traction;Moist Heat;DME Instruction;Gait training;Stair training;Functional mobility training;Therapeutic activities;Therapeutic exercise;Patient/family education;Neuromuscular re-education;Balance training;Prosthetic Training;Wheelchair mobility training;Manual techniques;Manual lymph drainage;Compression bandaging;Taping;Energy conservation;Passive range of motion    PT Next Visit Plan  progression of SLS on prosthetic limb,    PT Home Exercise Plan  see [id number] on medbridge, spc ambulation    Consulted and Agree with Plan of Care  Patient       Patient will benefit from skilled therapeutic intervention in order to improve the following deficits and impairments:  Abnormal gait, Decreased activity tolerance, Decreased balance, Decreased knowledge of precautions, Decreased endurance, Decreased knowledge of use of DME, Decreased mobility, Decreased range of motion, Difficulty walking, Decreased strength, Increased edema, Impaired flexibility, Impaired perceived functional ability, Prosthetic Dependency, Postural dysfunction, Improper body mechanics, Pain  Visit Diagnosis: Unsteadiness on feet  Other abnormalities of gait and mobility  Muscle weakness (generalized)  Unilateral complete BKA, right, sequela ([place])     Problem List Patient Active Problem List   Diagnosis Date Noted  . Pain due to onychomycosis of toenail of right foot 07/05/2018  . Abnormality of gait 10/12/2017  . Poorly controlled type 2 diabetes mellitus with peripheral neuropathy ([place])   . Flatulence   . Hypomagnesemia   . Unilateral complete BKA, right, sequela ([place])   . Benign essential HTN   . Hypoalbuminemia due to protein-calorie malnutrition ([place])   . S/P below knee amputation, right ([place]) 04/12/2017  . Acute blood loss anemia   . Other encephalopathy 04/08/2017  . Encephalopathy 04/08/2017  . Altered mental status 04/08/2017  . Labile blood  pressure   . Labile blood glucose   . Drug induced constipation   . Stage 3 chronic kidney disease   . Bacteremia   . S/P unilateral BKA (below knee amputation), right ([place]) 04/04/2017  . PAD (peripheral artery disease) ([place])   . Type 2 diabetes mellitus with right diabetic foot ulcer ([place])   . Post-operative pain   . Legally blind   . Upper GI bleed   . Streptococcal bacteremia 04/01/2017  . Hypokalemia 03/30/2017  . Uncontrolled type 2 diabetes mellitus with hyperglycemia, with long-term current use of insulin ([place]) 03/30/2017  . Type 2 diabetes mellitus with peripheral neuropathy ([place]) 03/30/2017  . AKI (acute kidney injury) ([place]) 03/30/2017  . CKD stage 3 due to type 2 diabetes mellitus ([place]) 03/30/2017  . Sepsis ([place]) 03/30/2017  . Heart murmur 11/22/2016  . Hyperlipidemia associated with type 2 diabetes mellitus ([place]) 11/22/2016  . Obstructive sleep apnea syndrome 11/22/2016  . Essential hypertension 12/17/2012   Janna Arch, PT, DPT   11/01/2018, 3:24 PM  [place] MAIN [facility] SERVICES [address] [place], [place], [address] Phone: [phone]  Fax:  [phone]  Name: SHAFIQ LARCH MRN: [id number] Date of Birth: 1955-12-23

## 2018-11-06 ENCOUNTER — Ambulatory Visit: Payer: Medicare Other

## 2018-11-08 ENCOUNTER — Other Ambulatory Visit: Payer: Self-pay

## 2018-11-08 ENCOUNTER — Ambulatory Visit: Payer: Medicare Other

## 2018-11-08 DIAGNOSIS — R2689 Other abnormalities of gait and mobility: Secondary | ICD-10-CM

## 2018-11-08 DIAGNOSIS — S88111S Complete traumatic amputation at level between knee and ankle, right lower leg, sequela: Secondary | ICD-10-CM

## 2018-11-08 DIAGNOSIS — R2681 Unsteadiness on feet: Secondary | ICD-10-CM | POA: Diagnosis not present

## 2018-11-08 DIAGNOSIS — M6281 Muscle weakness (generalized): Secondary | ICD-10-CM

## 2018-11-08 NOTE — Therapy (Signed)
[place] MAIN [facility] SERVICES [address] [place], [place], [address] Phone: [phone]   Fax:  [phone]  Physical Therapy Treatment  Patient Details  Name: Trevor [name] MRN: [id number] Date of Birth: October 30, 1955 No data recorded  Encounter Date: 11/08/2018  PT End of Session - 11/08/18 1448    Visit Number  81    Number of Visits  91    Date for PT Re-Evaluation  11/22/18    Authorization Type  1/10 PN 10/22/    PT Start Time  1430    PT Stop Time  1511    PT Time Calculation (min)  41 min    Equipment Utilized During Treatment  Gait belt   Rt BKA prosthesis   Activity Tolerance  Patient tolerated treatment well;Patient limited by fatigue;Other (comment)    Behavior During Therapy  WFL for tasks assessed/performed       Past Medical History:  Diagnosis Date  . Diabetes mellitus without complication ([place])   . Heart murmur   . Hyperlipidemia   . Hypertension   . Sleep apnea     Past Surgical History:  Procedure Laterality Date  . AMPUTATION Right 03/31/2017   Procedure: RIGHT FOOT 1ST AND 2ND RAY AMPUTATION;  Surgeon: Newt Minion, MD;  Location: [place];  Service: Orthopedics;  Laterality: Right;  . AMPUTATION Right 04/01/2017   Procedure: AMPUTATION BELOW KNEE;  Surgeon: Newt Minion, MD;  Location: [place];  Service: Orthopedics;  Laterality: Right;  . COLONOSCOPY WITH PROPOFOL N/A 10/02/2017   Procedure: COLONOSCOPY WITH PROPOFOL;  Surgeon: [name], MD;  Location: [facility] ENDOSCOPY;  Service: Gastroenterology;  Laterality: N/A;  . CORNEAL TRANSPLANT      There were no vitals filed for this visit.  Subjective Assessment - 11/08/18 1433    Subjective  Patient reports he is going to go to a new podiatrist for his L foot. Is having foot discomfort due to wearing new compression socks. No falls or LOB sine last session    Pertinent History  63 year old right-handed male with history of diabetes mellitus, hypertension,  legally blind, and CKD presenting for BKA 04/01/17. Patient wears contacts and can see better now.  Patient had two surgeries, one was a couple toes then they decided to go below the knee. 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 Has ambulated with RW in hospital with PT. Patient has had home health therapy and was in inpatient rehab about a month. Patient reports he was mainly sitting down, with occasional walking, last home health therapy in May. Patient reports some standing up but no exercises. Patient lives alone in the house and is scared they might fall. Patient received prosthetic leg Tuesday of last week.    Limitations  Standing;Walking;House hold activities;Other (comment)    How long can you stand comfortably?  10-15 minutes    How long can you walk comfortably?  10-15 minutes    Patient Stated Goals  going up steps and driving car.     Currently in Pain?  Yes    Pain Score  3     Pain Location  Foot    Pain Orientation  Left    Pain Descriptors / Indicators  Aching    Pain Type  Acute pain    Pain Onset  Yesterday    Pain Frequency  Intermittent  Patient reports he is going to go to a new podiatrist for his L foot. Is having foot discomfort due to wearing new compression socks.   vitals: 154/79 pulse 63       in // bars: airex pad: one foot on airex pad one foot on 4" step, 30 second holds each LE, 2x each LE airex pad: 4" step toe taps no UE support 10x each LE   Tapping hedgehogs without UE support 8x each LE; one in front, one in cross body, one on side of leg.   bosu ball modified lunge no UE support 10x each LE, challenging with prosthetic limb as weightbearing leg requiring occasional UE touches for stability   high knee marches length of // bars 4x length of // bars no UE support,   Seated: BTB: hamstring curls 15x each LE BTB adduction 15x each LE  Straight leg abduction/adduction 10x  each LE Seated swiss ball press between hands and knees 10x 3 second holds cueing for body mechanics Seated arms crossed, slow large marches 10x each LE 5x STS from lowered surface with SUE support    Pt educated throughout session about proper posture and technique with exercises. Improved exercise technique, movement at target joints, use of target muscles after min to mod verbal, visual, tactile cues.                 PT Education - 11/08/18 1448    Education Details  exercise technique, body mechanics    Person(s) Educated  Patient    Methods  Explanation;Demonstration;Tactile cues;Verbal cues    Comprehension  Verbalized understanding;Returned demonstration;Verbal cues required;Tactile cues required       PT Short Term Goals - 08/30/18 1701      PT SHORT TERM GOAL #1   Title  Patient will be independent in home exercise program to improve strength/mobility for better functional independence with ADLs.    Baseline  HEP given, 12/11/17: Performing daily:     Time  2    Period  Weeks    Status  Achieved      PT SHORT TERM GOAL #2   Title  Patient will don/doff prosthesis independently to allow for increased mobility in home.     Baseline  8/14: requires PT to guide/direct through process 8/28: independent    Time  2    Period  Weeks    Status  Achieved      PT SHORT TERM GOAL #3   Title  Patient will ambulate 10 meters with least assistive device and prosthesis to improve mobility in home.     Baseline  8/14: not walking outside of // bars yet  8/28: 63 seconds with RW and CGA     Time  2    Period  Weeks    Status  Achieved      PT SHORT TERM GOAL #4   Title  Patient will perform STS with single UE support to decrease reliance upon UE's for stability     Baseline  patient requires BUE support 11/5: 1 UR support    Time  2    Period  Weeks    Status  Achieved        PT Long Term Goals - 11/01/18 1504      PT LONG TERM GOAL #1   Title  Patient will  ambulate >1041f during 617m with LRAD to demonstrate improved community ambulation and functional mobility    Baseline  11/5: will complete next  session; 01/22/18: 400' in 3 minutes but had to stop secondary to pain in knee; 01/22/18: 400' in 3 minutes limited by R knee pain, 02/26/18: 500' in 3 minutes limited by low back pain; 06/07/18: Deferred due to new prosthetic; 07/30/18: 800' with SPC (500' after 3 min)- 2 rest breaks, and ended 30 seconds early due to knee and back pain 8/20 : 600 ft in 3 minutes and 20 seconds; terminated after this due to back pain 10/22: 635 ft with SPC, terminated early due to back pain    Time  12    Period  Weeks    Status  Partially Met    Target Date  11/22/18      PT LONG TERM GOAL #2   Title  Patient (> 10 years old) will complete five times sit to stand test in < 15 seconds indicating an increased LE strength and improved balance.    Baseline  8/14: 61 seconds 8/28; 42 seconds 9/17: 24 seconds BUE support with LLE 10/10: 17 seconds with excessive BUE support  11/5: 18seconds BUE support; 12/11/17: 12/11/17: 18.5s with BUE support but no AD, 01/22/18: 17.1s BUE support but no AD; 02/26/18: 13.0s with BUE, still unable to perform without UE assistance; 06/07/18: 25.4s with single UE support during first rep only; 07/30/18: 24.3s with single UE support during 1st rep only 8/20: 10 seconds BUE support, 16 seconds SUE support 10/22: 15 seconds: 3 with no UE support, 2 with UE support due to one Posterior LOB.    Time  12    Period  Weeks    Status  Partially Met    Target Date  11/22/18      PT LONG TERM GOAL #3   Title  Patient will reduce timed up and go to <11 seconds to reduce fall risk and demonstrate improved transfer/gait ability.    Baseline  8/14: unable to perform 8/28: 1 min 30 seconds 9/17: 38 seconds RW 10/10: 25 seconds with RW 11/5: 20 seconds with cane;, 12/11/17: 17.4s with spc; 01/22/18: 14.8s with spc; 02/26/18: 13.0s with spc; 06/07/18: 16.1s with RW; 07/30/18:  12.44s with SPC 8/20: 11 seconds with SPC    Time  12    Period  Weeks    Status  Achieved      PT LONG TERM GOAL #4   Title  Patient will increase lower extremity functional scale to >60/80 to demonstrate improved functional mobility and increased tolerance with ADLs.     Baseline  814: 29/80 8/28: 20/80 10/10: 20/80 11/5: 22/80; 12/11/17: 18/80; 01/22/18: 27/80; 06/07/18: 30/80; 07/30/18: 35/80 8/20: 36/80 10/22: 36/80    Time  12    Period  Weeks    Status  Partially Met    Target Date  11/22/18      PT LONG TERM GOAL #5   Title  Patient will perform 10 MWT in >1.0 m/s for improved mobility and safety with negotiating natural environment    Baseline  9/17: .53 m/s 10/10: .63 m/s with RW 11/5: 0.57ms; 12/11/17: 15.33s = 0.65 m/s with spc; 01/22/18: self-selected: 14.4s = 0.69 m/s, fastest: 11.8s = 0.85 m/s; 02/26/18: self-selected: 13.3s = 0.75 m/s, fastest: 10.6s = 0.94 m/s; 06/07/18: self-selected: 14.0s = 0.71 m/s, fastest: 11.7s = 0.85 m/s; 07/30/18: self selected: 12.45s = 0.88m, fastest:8.84s = 1.1381m8/20: 1.1 m/s with SPC    Time  12    Period  Weeks    Status  Achieved      PT LONG  TERM GOAL #6   Title  Patient will be mod I for negotiating curb with LRAD while wearing prosthetic for safe community ambulation.    Baseline  8/20: patient not able to negotiate curbs 10/22: able to negotiate curl with [facility] per patient report    Time  12    Period  Weeks    Status  Achieved            Plan - 11/08/18 1503    Clinical Impression Statement  Patient is challenged with weight acceptance onto prosthetic limb in single limb stance without UE support. He requires cueing and motivation for task performance due to feeling of unsteadiness in this position. Pt will benefit from PT services to address deficits in strength, balance, and mobility in order to return to full function at home    Examination-Participation Restrictions  Interpersonal Relationship    Rehab Potential  Fair     Clinical Impairments Affecting Rehab Potential  (+) previous independence, motivation to return to walking (-) lives alone, hx of diabetes, limited vision     PT Frequency  2x / week    PT Duration  8 weeks    PT Treatment/Interventions  ADLs/Self Care Home Management;Cryotherapy;Electrical Stimulation;Ultrasound;Traction;Moist Heat;DME Instruction;Gait training;Stair training;Functional mobility training;Therapeutic activities;Therapeutic exercise;Patient/family education;Neuromuscular re-education;Balance training;Prosthetic Training;Wheelchair mobility training;Manual techniques;Manual lymph drainage;Compression bandaging;Taping;Energy conservation;Passive range of motion    PT Next Visit Plan  progression of SLS on prosthetic limb,    PT Home Exercise Plan  see [id number] on medbridge, spc ambulation    Consulted and Agree with Plan of Care  Patient       Patient will benefit from skilled therapeutic intervention in order to improve the following deficits and impairments:  Abnormal gait, Decreased activity tolerance, Decreased balance, Decreased knowledge of precautions, Decreased endurance, Decreased knowledge of use of DME, Decreased mobility, Decreased range of motion, Difficulty walking, Decreased strength, Increased edema, Impaired flexibility, Impaired perceived functional ability, Prosthetic Dependency, Postural dysfunction, Improper body mechanics, Pain  Visit Diagnosis: Unsteadiness on feet  Other abnormalities of gait and mobility  Muscle weakness (generalized)  Unilateral complete BKA, right, sequela ([place])     Problem List Patient Active Problem List   Diagnosis Date Noted  . Pain due to onychomycosis of toenail of right foot 07/05/2018  . Abnormality of gait 10/12/2017  . Poorly controlled type 2 diabetes mellitus with peripheral neuropathy ([place])   . Flatulence   . Hypomagnesemia   . Unilateral complete BKA, right, sequela ([place])   . Benign essential HTN   .  Hypoalbuminemia due to protein-calorie malnutrition ([place])   . S/P below knee amputation, right ([place]) 04/12/2017  . Acute blood loss anemia   . Other encephalopathy 04/08/2017  . Encephalopathy 04/08/2017  . Altered mental status 04/08/2017  . Labile blood pressure   . Labile blood glucose   . Drug induced constipation   . Stage 3 chronic kidney disease   . Bacteremia   . S/P unilateral BKA (below knee amputation), right ([place]) 04/04/2017  . PAD (peripheral artery disease) ([place])   . Type 2 diabetes mellitus with right diabetic foot ulcer ([place])   . Post-operative pain   . Legally blind   . Upper GI bleed   . Streptococcal bacteremia 04/01/2017  . Hypokalemia 03/30/2017  . Uncontrolled type 2 diabetes mellitus with hyperglycemia, with long-term current use of insulin ([place]) 03/30/2017  . Type 2 diabetes mellitus with peripheral neuropathy ([place]) 03/30/2017  . AKI (acute kidney injury) ([place]) 03/30/2017  .  CKD stage 3 due to type 2 diabetes mellitus ([place]) 03/30/2017  . Sepsis ([place]) 03/30/2017  . Heart murmur 11/22/2016  . Hyperlipidemia associated with type 2 diabetes mellitus ([place]) 11/22/2016  . Obstructive sleep apnea syndrome 11/22/2016  . Essential hypertension 12/17/2012   Janna Arch, PT, DPT   11/08/2018, 3:14 PM  [place] MAIN [facility] SERVICES [address] [place], [place], [address] Phone: [phone]   Fax:  [phone]  Name: DEVONTAY CELAYA MRN: [id number] Date of Birth: 23-Jun-1955

## 2018-11-09 ENCOUNTER — Other Ambulatory Visit: Payer: Self-pay

## 2018-11-09 ENCOUNTER — Encounter: Payer: Self-pay | Admitting: Physical Medicine & Rehabilitation

## 2018-11-09 ENCOUNTER — Encounter: Payer: Medicare Other | Attending: Physical Medicine & Rehabilitation | Admitting: Physical Medicine & Rehabilitation

## 2018-11-09 VITALS — BP 144/84 | HR 63 | Resp 14 | Ht 76.0 in | Wt 320.0 lb

## 2018-11-09 DIAGNOSIS — E1142 Type 2 diabetes mellitus with diabetic polyneuropathy: Secondary | ICD-10-CM | POA: Diagnosis present

## 2018-11-09 DIAGNOSIS — R269 Unspecified abnormalities of gait and mobility: Secondary | ICD-10-CM | POA: Insufficient documentation

## 2018-11-09 DIAGNOSIS — E1165 Type 2 diabetes mellitus with hyperglycemia: Secondary | ICD-10-CM

## 2018-11-09 DIAGNOSIS — S88111S Complete traumatic amputation at level between knee and ankle, right lower leg, sequela: Secondary | ICD-10-CM | POA: Diagnosis present

## 2018-11-09 NOTE — Progress Notes (Signed)
Subjective:    Patient ID: Trevor [name], male    DOB: [birth year], 63 y.o.   MRN: [id number]  HPI  63 year old right-handed male with history of diabetes mellitus, hypertension, legally blind, and CKD presents for follow up for right BKA.    Last clinic visit 05/03/2018.  Since last visit, patient went to the ED for leg swelling, notes reviewed. Patient states he went for foot "crack".  He is still in therapies. He had a fall getting off the toilet when he forgot to lock his chair. He has not been checking his CBGs regularly.   Pain Inventory Average Pain 3 Pain Right Now 0 My pain is intermittent and dull  In the last 24 hours, has pain interfered with the following? General activity 5 Relation with others 10 Enjoyment of life 10 What TIME of day is your pain at its worst? evening Sleep (in general) Fair  Pain is worse with: walking Pain improves with: rest Relief from Meds: 10  Mobility walk with assistance use a cane ability to climb steps?  yes do you drive?  yes  Function disabled: date disabled . retired I need assistance with the following:  dressing, meal prep, household duties and shopping  Neuro/Psych trouble walking  Physicians involved in your care Any changes since last visit?  no   History reviewed. No pertinent family history. Social History   Socioeconomic History  . Marital status: Single    Spouse name: Not on file  . Number of children: Not on file  . Years of education: Not on file  . Highest education level: Not on file  Occupational History  . Not on file  Social Needs  . Financial resource strain: Not on file  . Food insecurity    Worry: Not on file    Inability: Not on file  . Transportation needs    Medical: Not on file    Non-medical: Not on file  Tobacco Use  . Smoking status: Never Smoker  . Smokeless tobacco: Never Used  Substance and Sexual Activity  . Alcohol use: No  . Drug use: Never  . Sexual activity: Not on  file  Lifestyle  . Physical activity    Days per week: Not on file    Minutes per session: Not on file  . Stress: Not on file  Relationships  . Social Herbalist on phone: Not on file    Gets together: Not on file    Attends religious service: Not on file    Active member of club or organization: Not on file    Attends meetings of clubs or organizations: Not on file    Relationship status: Not on file  Other Topics Concern  . Not on file  Social History Narrative  . Not on file   Past Surgical History:  Procedure Laterality Date  . AMPUTATION Right 03/31/2017   Procedure: RIGHT FOOT 1ST AND 2ND RAY AMPUTATION;  Surgeon: Newt Minion, MD;  Location: [place];  Service: Orthopedics;  Laterality: Right;  . AMPUTATION Right 04/01/2017   Procedure: AMPUTATION BELOW KNEE;  Surgeon: Newt Minion, MD;  Location: [place];  Service: Orthopedics;  Laterality: Right;  . COLONOSCOPY WITH PROPOFOL N/A 10/02/2017   Procedure: COLONOSCOPY WITH PROPOFOL;  Surgeon: [name], MD;  Location[facility] ENDOSCOPY;  Service: Gastroenterology;  Laterality: N/A;  . CORNEAL TRANSPLANT     Past Medical History:  Diagnosis Date  . Diabetes mellitus without complication ([place])   .  Heart murmur   . Hyperlipidemia   . Hypertension   . Sleep apnea    BP (!) 144/84   Pulse 63   Resp 14   Ht 6\' 4"  (1.93 m)   Wt (!) 320 lb (145.2 kg)   SpO2 95%   BMI 38.95 kg/m   Opioid Risk Score:   Fall Risk Score:  `1  Depression screen PHQ 2/9  Depression screen [facility] 2/9 10/12/2017 08/10/2017 06/26/2017 05/11/2017 03/29/2017 11/22/2016  Decreased Interest 0 0 0 0 2 0  Down, Depressed, Hopeless 0 0 0 0 0 0  PHQ - 2 Score 0 0 0 0 2 0  Altered sleeping - - 1 1 0 1  Tired, decreased energy - - 1 0 3 1  Change in appetite - - - 0 1 0  Feeling bad or failure about yourself  - - 0 0 0 0  Trouble concentrating - - 0 0 0 0  Moving slowly or fidgety/restless - - 0 0 1 1  Suicidal thoughts - - 0 0 0 0  PHQ-9 Score - -  2 1 7 3   Some recent data might be hidden    Review of Systems  Constitutional: Negative.   HENT: Negative.   Eyes: Negative.   Respiratory: Negative.   Cardiovascular: Negative.   Gastrointestinal: Negative.   Endocrine: Negative.        High blood sugar  Genitourinary: Negative.   Musculoskeletal: Positive for back pain and gait problem.  Skin: Negative.   Allergic/Immunologic: Negative.   Neurological:       Tingling   Hematological: Negative.   Psychiatric/Behavioral: Negative.       Objective:   Physical Exam Constitutional: No distress . Vital signs reviewed. HENT: Normocephalic.  Atraumatic. Eyes: EOMI. No discharge. Cardiovascular: No JVD. Respiratory: Normal effort.  No stridor. GI: Non-distended. Skin: Warm and dry.  Intact. Psych: Normal mood.  Normal behavior. Musc: No edema in extremities.  No tenderness in extremities. Neuro: Alert Motor:  LLE: 5/5 proximal to distal  Right HF, KE: 5/5  Skin: BKA healed Psychiatric: Alert and appropriate    Assessment & Plan:  63 year old right-handed male with history of diabetes mellitus, hypertension, legally blind, and CKD presents for follow up for right BKA.    1.  Right transtibial amputation   Cont PT for gait training  Released by Ortho  New prosthesis obtained ~06/2018, fitting betterprosthetist  2. Gait abnormality  Cont therapies  Cont cane for safety  3. DM  CBGs have been <200. Recently obtained HbA1c.  Cont follow up with PCP.

## 2018-11-13 ENCOUNTER — Other Ambulatory Visit: Payer: Self-pay

## 2018-11-13 ENCOUNTER — Ambulatory Visit: Payer: Medicare Other | Attending: Physical Medicine & Rehabilitation

## 2018-11-13 VITALS — BP 127/69 | HR 66

## 2018-11-13 DIAGNOSIS — R2681 Unsteadiness on feet: Secondary | ICD-10-CM | POA: Diagnosis present

## 2018-11-13 DIAGNOSIS — S88111S Complete traumatic amputation at level between knee and ankle, right lower leg, sequela: Secondary | ICD-10-CM | POA: Insufficient documentation

## 2018-11-13 DIAGNOSIS — M6281 Muscle weakness (generalized): Secondary | ICD-10-CM | POA: Diagnosis present

## 2018-11-13 DIAGNOSIS — R2689 Other abnormalities of gait and mobility: Secondary | ICD-10-CM | POA: Insufficient documentation

## 2018-11-13 NOTE — Therapy (Signed)
[place] MAIN [facility] SERVICES [address] [employer] [place] [employer] [place], [place], [address] Phone: [phone]   Fax:  [phone]  Physical Therapy Treatment  Patient Details  Name: Trevor [name] MRN: [id number] Date of Birth: January 03, 1956 No data recorded  Encounter Date: 11/13/2018  PT End of Session - 11/13/18 1436    Visit Number  82    Number of Visits  91    Date for PT Re-Evaluation  11/22/18    Authorization Type  1/10 PN 10/22/    PT Start Time  0236    PT Stop Time  0315    PT Time Calculation (min)  39 min    Equipment Utilized During Treatment  Gait belt   Rt BKA prosthesis   Activity Tolerance  Patient tolerated treatment well;Patient limited by fatigue;Other (comment)    Behavior During Therapy  WFL for tasks assessed/performed       Past Medical History:  Diagnosis Date  . Diabetes mellitus without complication ([place])   . Heart murmur   . Hyperlipidemia   . Hypertension   . Sleep apnea     Past Surgical History:  Procedure Laterality Date  . AMPUTATION Right 03/31/2017   Procedure: RIGHT FOOT 1ST AND 2ND RAY AMPUTATION;  Surgeon: Newt Minion, MD;  Location: [facility];  Service: Orthopedics;  Laterality: Right;  . AMPUTATION Right 04/01/2017   Procedure: AMPUTATION BELOW KNEE;  Surgeon: Newt Minion, MD;  Location: [facility];  Service: Orthopedics;  Laterality: Right;  . COLONOSCOPY WITH PROPOFOL N/A 10/02/2017   Procedure: COLONOSCOPY WITH PROPOFOL;  Surgeon: [name], MD;  Location: [facility] ENDOSCOPY;  Service: Gastroenterology;  Laterality: N/A;  . CORNEAL TRANSPLANT      Vitals:   11/13/18 1443  BP: 127/69  Pulse: 66  SpO2: 99%    Subjective Assessment - 11/13/18 1439    Subjective  Patient reports soreness in the back of his L heel.  He has a podiatrist appt on Friday.  He reports no new falls since last visit.    Pertinent History  63 year old right-handed male with history of diabetes mellitus, hypertension, legally blind, and  CKD presenting for BKA 04/01/17. Patient wears contacts and can see better now.  Patient had two surgeries, one was a couple toes then they decided to go below the knee. 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 Has ambulated with RW in hospital with PT. Patient has had home health therapy and was in inpatient rehab about a month. Patient reports he was mainly sitting down, with occasional walking, last home health therapy in May. Patient reports some standing up but no exercises. Patient lives alone in the house and is scared they might fall. Patient received prosthetic leg Tuesday of last week.    Limitations  Standing;Walking;House hold activities;Other (comment)    How long can you stand comfortably?  10-15 minutes    How long can you walk comfortably?  10-15 minutes    Patient Stated Goals  going up steps and driving car.     Currently in Pain?  Yes    Pain Score  3     Pain Location  Heel    Pain Orientation  Left    Pain Descriptors / Indicators  Sore    Pain Type  Acute pain    Pain Onset  In the past 7 days    Pain Frequency  Constant         Neuromuscular Re-education    1000' of gait with intermittent commands of head turns.  He displays an increased cadence today with equal step length. He requires 2 intermittent breaks throughout due to fatigue and LBP. Fatigue monitored throughout distance. SPC utilized during 10-20% of intervention.  Therapeutic Exercise:  Seated: BTB: hamstring curls 15x each LE BTB adduction 15x each LE  BTB abdcution 15x performed bilaterally 2x5 STS from mat table without uE support    Pt educated throughout session about proper posture and technique with exercises. Improved exercise technique, movement at target joints, use of target muscles after min to mod verbal, visual, tactile cues.    Pt demonstrates good motivation throughout today's session.  Patient reports his L  heel is sore today.  Upon evaluation there is a small fissure in his heel, but there is no redness or discharge in the area.  He has a podiatry appt on Friday.  He was able to walk >1000' today with 2 rest breaks throughout due to LBP.  He demonstrates increased cadence and equal step length today.  Encouraged pt to continue HEP and follow-up as scheduled. Pt will benefit from PT services to address deficits in strength, balance, and mobility in order to return to full function at home.                         PT Short Term Goals - 08/30/18 1701      PT SHORT TERM GOAL #1   Title  Patient will be independent in home exercise program to improve strength/mobility for better functional independence with ADLs.    Baseline  HEP given, 12/11/17: Performing daily:     Time  2    Period  Weeks    Status  Achieved      PT SHORT TERM GOAL #2   Title  Patient will don/doff prosthesis independently to allow for increased mobility in home.     Baseline  8/14: requires PT to guide/direct through process 8/28: independent    Time  2    Period  Weeks    Status  Achieved      PT SHORT TERM GOAL #3   Title  Patient will ambulate 10 meters with least assistive device and prosthesis to improve mobility in home.     Baseline  8/14: not walking outside of // bars yet  8/28: 63 seconds with RW and CGA     Time  2    Period  Weeks    Status  Achieved      PT SHORT TERM GOAL #4   Title  Patient will perform STS with single UE support to decrease reliance upon UE's for stability     Baseline  patient requires BUE support 11/5: 1 UR support    Time  2    Period  Weeks    Status  Achieved        PT Long Term Goals - 11/01/18 1504      PT LONG TERM GOAL #1   Title  Patient will ambulate >1040f during 644m with LRAD to demonstrate improved community ambulation and functional mobility    Baseline  11/5: will complete next session; 01/22/18: 400' in 3 minutes but had to stop secondary to  pain in knee; 01/22/18: 400' in 3 minutes limited by R knee pain, 02/26/18: 500' in 3 minutes limited by low back pain; 06/07/18: Deferred due  to new prosthetic; 07/30/18: 800' with SPC (500' after 3 min)- 2 rest breaks, and ended 30 seconds early due to knee and back pain 8/20 : 600 ft in 3 minutes and 20 seconds; terminated after this due to back pain 10/22: 635 ft with SPC, terminated early due to back pain    Time  12    Period  Weeks    Status  Partially Met    Target Date  11/22/18      PT LONG TERM GOAL #2   Title  Patient (> 59 years old) will complete five times sit to stand test in < 15 seconds indicating an increased LE strength and improved balance.    Baseline  8/14: 61 seconds 8/28; 42 seconds 9/17: 24 seconds BUE support with LLE 10/10: 17 seconds with excessive BUE support  11/5: 18seconds BUE support; 12/11/17: 12/11/17: 18.5s with BUE support but no AD, 01/22/18: 17.1s BUE support but no AD; 02/26/18: 13.0s with BUE, still unable to perform without UE assistance; 06/07/18: 25.4s with single UE support during first rep only; 07/30/18: 24.3s with single UE support during 1st rep only 8/20: 10 seconds BUE support, 16 seconds SUE support 10/22: 15 seconds: 3 with no UE support, 2 with UE support due to one Posterior LOB.    Time  12    Period  Weeks    Status  Partially Met    Target Date  11/22/18      PT LONG TERM GOAL #3   Title  Patient will reduce timed up and go to <11 seconds to reduce fall risk and demonstrate improved transfer/gait ability.    Baseline  8/14: unable to perform 8/28: 1 min 30 seconds 9/17: 38 seconds RW 10/10: 25 seconds with RW 11/5: 20 seconds with cane;, 12/11/17: 17.4s with spc; 01/22/18: 14.8s with spc; 02/26/18: 13.0s with spc; 06/07/18: 16.1s with RW; 07/30/18: 12.44s with SPC 8/20: 11 seconds with SPC    Time  12    Period  Weeks    Status  Achieved      PT LONG TERM GOAL #4   Title  Patient will increase lower extremity functional scale to >60/80 to demonstrate  improved functional mobility and increased tolerance with ADLs.     Baseline  814: 29/80 8/28: 20/80 10/10: 20/80 11/5: 22/80; 12/11/17: 18/80; 01/22/18: 27/80; 06/07/18: 30/80; 07/30/18: 35/80 8/20: 36/80 10/22: 36/80    Time  12    Period  Weeks    Status  Partially Met    Target Date  11/22/18      PT LONG TERM GOAL #5   Title  Patient will perform 10 MWT in >1.0 m/s for improved mobility and safety with negotiating natural environment    Baseline  9/17: .53 m/s 10/10: .63 m/s with RW 11/5: 0.82ms; 12/11/17: 15.33s = 0.65 m/s with spc; 01/22/18: self-selected: 14.4s = 0.69 m/s, fastest: 11.8s = 0.85 m/s; 02/26/18: self-selected: 13.3s = 0.75 m/s, fastest: 10.6s = 0.94 m/s; 06/07/18: self-selected: 14.0s = 0.71 m/s, fastest: 11.7s = 0.85 m/s; 07/30/18: self selected: 12.45s = 0.871m, fastest:8.84s = 1.1373m8/20: 1.1 m/s with SPC    Time  12    Period  Weeks    Status  Achieved      PT LONG TERM GOAL #6   Title  Patient will be mod I for negotiating curb with LRAD while wearing prosthetic for safe community ambulation.    Baseline  8/20: patient not able to negotiate curbs 10/22: able  to negotiate curl with [facility] per patient report    Time  12    Period  Weeks    Status  Achieved            Plan - 11/13/18 1525    Clinical Impression Statement  Pt demonstrates good motivation throughout today's session.  Patient reports his L heel is sore today.  Upon evaluation there is a small fissure in his heel, but there is no redness or discharge in the area.  He has a podiatry appt on Friday.  He was able to walk >1000' today with 2 rest breaks throughout due to LBP.  He demonstrates increased cadence and equal step length today.  Encouraged pt to continue HEP and follow-up as scheduled. Pt will benefit from PT services to address deficits in strength, balance, and mobility in order to return to full function at home.    Examination-Participation Restrictions  Interpersonal Relationship    Rehab  Potential  Fair    Clinical Impairments Affecting Rehab Potential  (+) previous independence, motivation to return to walking (-) lives alone, hx of diabetes, limited vision     PT Frequency  2x / week    PT Duration  8 weeks    PT Treatment/Interventions  ADLs/Self Care Home Management;Cryotherapy;Electrical Stimulation;Ultrasound;Traction;Moist Heat;DME Instruction;Gait training;Stair training;Functional mobility training;Therapeutic activities;Therapeutic exercise;Patient/family education;Neuromuscular re-education;Balance training;Prosthetic Training;Wheelchair mobility training;Manual techniques;Manual lymph drainage;Compression bandaging;Taping;Energy conservation;Passive range of motion    PT Next Visit Plan  progression of SLS on prosthetic limb,    PT Home Exercise Plan  see [id number] on medbridge, spc ambulation    Consulted and Agree with Plan of Care  Patient       Patient will benefit from skilled therapeutic intervention in order to improve the following deficits and impairments:  Abnormal gait, Decreased activity tolerance, Decreased balance, Decreased knowledge of precautions, Decreased endurance, Decreased knowledge of use of DME, Decreased mobility, Decreased range of motion, Difficulty walking, Decreased strength, Increased edema, Impaired flexibility, Impaired perceived functional ability, Prosthetic Dependency, Postural dysfunction, Improper body mechanics, Pain  Visit Diagnosis: Unsteadiness on feet  Other abnormalities of gait and mobility  Muscle weakness (generalized)     Problem List Patient Active Problem List   Diagnosis Date Noted  . Pain due to onychomycosis of toenail of right foot 07/05/2018  . Abnormality of gait 10/12/2017  . Poorly controlled type 2 diabetes mellitus with peripheral neuropathy ([place])   . Flatulence   . Hypomagnesemia   . Unilateral complete BKA, right, sequela ([place])   . Benign essential HTN   . Hypoalbuminemia due to protein-calorie  malnutrition ([place])   . S/P below knee amputation, right ([place]) 04/12/2017  . Acute blood loss anemia   . Other encephalopathy 04/08/2017  . Encephalopathy 04/08/2017  . Altered mental status 04/08/2017  . Labile blood pressure   . Labile blood glucose   . Drug induced constipation   . Stage 3 chronic kidney disease   . Bacteremia   . S/P unilateral BKA (below knee amputation), right ([place]) 04/04/2017  . PAD (peripheral artery disease) ([place])   . Type 2 diabetes mellitus with right diabetic foot ulcer ([place])   . Post-operative pain   . Legally blind   . Upper GI bleed   . Streptococcal bacteremia 04/01/2017  . Hypokalemia 03/30/2017  . Uncontrolled type 2 diabetes mellitus with hyperglycemia, with long-term current use of insulin ([place]) 03/30/2017  . Type 2 diabetes mellitus with peripheral neuropathy ([place]) 03/30/2017  . AKI (acute kidney injury) (  [place]) 03/30/2017  . CKD stage 3 due to type 2 diabetes mellitus ([place]) 03/30/2017  . Sepsis ([place]) 03/30/2017  . Heart murmur 11/22/2016  . Hyperlipidemia associated with type 2 diabetes mellitus ([place]) 11/22/2016  . Obstructive sleep apnea syndrome 11/22/2016  . Essential hypertension 12/17/2012    This entire session was performed under direct supervision and direction of a licensed therapist/therapist assistant . I have personally read, edited and approve of the note as written.   Lutricia Horsfall, SPT [facility] Grout PT, DPT, GCS  Huprich,Jason 11/14/2018, 11:30 AM  [place] MAIN [facility] SERVICES [address] [place], [place], [address] Phone: [phone]   Fax:  [phone]  Name: DONOVAN PERSLEY MRN: [id number] Date of Birth: 10-25-1955

## 2018-11-15 ENCOUNTER — Other Ambulatory Visit: Payer: Self-pay

## 2018-11-15 ENCOUNTER — Ambulatory Visit: Payer: Medicare Other

## 2018-11-15 DIAGNOSIS — R2681 Unsteadiness on feet: Secondary | ICD-10-CM | POA: Diagnosis not present

## 2018-11-15 DIAGNOSIS — M6281 Muscle weakness (generalized): Secondary | ICD-10-CM

## 2018-11-15 DIAGNOSIS — R2689 Other abnormalities of gait and mobility: Secondary | ICD-10-CM

## 2018-11-15 DIAGNOSIS — S88111S Complete traumatic amputation at level between knee and ankle, right lower leg, sequela: Secondary | ICD-10-CM

## 2018-11-15 NOTE — Therapy (Signed)
[place] MAIN [facility] SERVICES [address] [place], [place], [address] Phone: [phone]   Fax:  [phone]  Physical Therapy Treatment  Patient Details  Name: Trevor [name] MRN: [id number] Date of Birth: 18-Dec-1955 No data recorded  Encounter Date: 11/15/2018  PT End of Session - 11/15/18 1728    Visit Number  7    Number of Visits  91    Date for PT Re-Evaluation  11/22/18    Authorization Type  3/10 PN 10/22/    PT Start Time  1433    PT Stop Time  1513    PT Time Calculation (min)  40 min    Equipment Utilized During Treatment  Gait belt   Rt BKA prosthesis   Activity Tolerance  Patient tolerated treatment well;Patient limited by fatigue;Other (comment)    Behavior During Therapy  WFL for tasks assessed/performed       Past Medical History:  Diagnosis Date  . Diabetes mellitus without complication ([place])   . Heart murmur   . Hyperlipidemia   . Hypertension   . Sleep apnea     Past Surgical History:  Procedure Laterality Date  . AMPUTATION Right 03/31/2017   Procedure: RIGHT FOOT 1ST AND 2ND RAY AMPUTATION;  Surgeon: Newt Minion, MD;  Location: [place];  Service: Orthopedics;  Laterality: Right;  . AMPUTATION Right 04/01/2017   Procedure: AMPUTATION BELOW KNEE;  Surgeon: Newt Minion, MD;  Location: [place];  Service: Orthopedics;  Laterality: Right;  . COLONOSCOPY WITH PROPOFOL N/A 10/02/2017   Procedure: COLONOSCOPY WITH PROPOFOL;  Surgeon: [name], MD;  Location: [facility];  Service: Gastroenterology;  Laterality: N/A;  . CORNEAL TRANSPLANT      There were no vitals filed for this visit.  Subjective Assessment - 11/15/18 1437    Subjective  Patietn reports back of L heel continues to hurt, is seeing physician tomorrow for it. No falls or LOB since last session.    Pertinent History  63 year old right-handed male with history of diabetes mellitus, hypertension, legally blind, and CKD presenting for BKA  04/01/17. Patient wears contacts and can see better now.  Patient had two surgeries, one was a couple toes then they decided to go below the knee. 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 Has ambulated with RW in hospital with PT. Patient has had home health therapy and was in inpatient rehab about a month. Patient reports he was mainly sitting down, with occasional walking, last home health therapy in May. Patient reports some standing up but no exercises. Patient lives alone in the house and is scared they might fall. Patient received prosthetic leg Tuesday of last week.    Limitations  Standing;Walking;House hold activities;Other (comment)    How long can you stand comfortably?  10-15 minutes    How long can you walk comfortably?  10-15 minutes    Patient Stated Goals  going up steps and driving car.     Currently in Pain?  Yes    Pain Score  3     Pain Location  Heel    Pain Orientation  Left    Pain Descriptors / Indicators  Stabbing    Pain Type  Acute pain    Pain Onset  In the past 7 days        Vitals 126/ 65 pulse 59     Neuromuscular Re-education  2000' of gait with intermittent commands of head turns.  He displays an increased cadence today with equal step length. He requires 2 intermittent breaks throughout due to fatigue and LBP. Fatigue monitored throughout distance. SPC utilized during 10-20% of intervention.   Therapeutic Exercise:  Prone R IT band and lateral hamstring rolling with stick x 4 minutes  Seated: BTB: hamstring curls 15x each LE BTB adduction 15x each LE  5lb ankle weight:  -LAQ 15x each LE -marching with upright posture 10x each LE cueing for core activation    Patient improving with gait capacity and capacity for functional mobility with patient performign 2 laps equaling > 2000 ft  With two rest breaks. Patient requires rolling of R posterior musculature for pain relief s/p  ambulation. scheduled. Pt will benefit from PT services to address deficits in strength, balance, and mobility in order to return to full function at home                    PT Education - 11/15/18 1727    Education Details  gait mechanics, body mechanics    Person(s) Educated  Patient    Methods  Explanation;Demonstration;Tactile cues;Verbal cues    Comprehension  Verbalized understanding;Returned demonstration;Verbal cues required;Tactile cues required       PT Short Term Goals - 08/30/18 1701      PT SHORT TERM GOAL #1   Title  Patient will be independent in home exercise program to improve strength/mobility for better functional independence with ADLs.    Baseline  HEP given, 12/11/17: Performing daily:     Time  2    Period  Weeks    Status  Achieved      PT SHORT TERM GOAL #2   Title  Patient will don/doff prosthesis independently to allow for increased mobility in home.     Baseline  8/14: requires PT to guide/direct through process 8/28: independent    Time  2    Period  Weeks    Status  Achieved      PT SHORT TERM GOAL #3   Title  Patient will ambulate 10 meters with least assistive device and prosthesis to improve mobility in home.     Baseline  8/14: not walking outside of // bars yet  8/28: 63 seconds with RW and CGA     Time  2    Period  Weeks    Status  Achieved      PT SHORT TERM GOAL #4   Title  Patient will perform STS with single UE support to decrease reliance upon UE's for stability     Baseline  patient requires BUE support 11/5: 1 UR support    Time  2    Period  Weeks    Status  Achieved        PT Long Term Goals - 11/01/18 1504      PT LONG TERM GOAL #1   Title  Patient will ambulate >1067f during 673m with LRAD to demonstrate improved community ambulation and functional mobility    Baseline  11/5: will complete next session; 01/22/18: 400' in 3 minutes but had to stop secondary to pain in knee; 01/22/18: 400' in 3 minutes  limited by R knee pain, 02/26/18: 500' in 3 minutes limited by low back pain; 06/07/18: Deferred due to new prosthetic; 07/30/18: 800' with SPC (500' after 3 min)- 2 rest breaks, and ended 30 seconds early due to knee and back pain 8/20 :  600 ft in 3 minutes and 20 seconds; terminated after this due to back pain 10/22: 635 ft with SPC, terminated early due to back pain    Time  12    Period  Weeks    Status  Partially Met    Target Date  11/22/18      PT LONG TERM GOAL #2   Title  Patient (> 30 years old) will complete five times sit to stand test in < 15 seconds indicating an increased LE strength and improved balance.    Baseline  8/14: 61 seconds 8/28; 42 seconds 9/17: 24 seconds BUE support with LLE 10/10: 17 seconds with excessive BUE support  11/5: 18seconds BUE support; 12/11/17: 12/11/17: 18.5s with BUE support but no AD, 01/22/18: 17.1s BUE support but no AD; 02/26/18: 13.0s with BUE, still unable to perform without UE assistance; 06/07/18: 25.4s with single UE support during first rep only; 07/30/18: 24.3s with single UE support during 1st rep only 8/20: 10 seconds BUE support, 16 seconds SUE support 10/22: 15 seconds: 3 with no UE support, 2 with UE support due to one Posterior LOB.    Time  12    Period  Weeks    Status  Partially Met    Target Date  11/22/18      PT LONG TERM GOAL #3   Title  Patient will reduce timed up and go to <11 seconds to reduce fall risk and demonstrate improved transfer/gait ability.    Baseline  8/14: unable to perform 8/28: 1 min 30 seconds 9/17: 38 seconds RW 10/10: 25 seconds with RW 11/5: 20 seconds with cane;, 12/11/17: 17.4s with spc; 01/22/18: 14.8s with spc; 02/26/18: 13.0s with spc; 06/07/18: 16.1s with RW; 07/30/18: 12.44s with SPC 8/20: 11 seconds with SPC    Time  12    Period  Weeks    Status  Achieved      PT LONG TERM GOAL #4   Title  Patient will increase lower extremity functional scale to >60/80 to demonstrate improved functional mobility and  increased tolerance with ADLs.     Baseline  814: 29/80 8/28: 20/80 10/10: 20/80 11/5: 22/80; 12/11/17: 18/80; 01/22/18: 27/80; 06/07/18: 30/80; 07/30/18: 35/80 8/20: 36/80 10/22: 36/80    Time  12    Period  Weeks    Status  Partially Met    Target Date  11/22/18      PT LONG TERM GOAL #5   Title  Patient will perform 10 MWT in >1.0 m/s for improved mobility and safety with negotiating natural environment    Baseline  9/17: .53 m/s 10/10: .63 m/s with RW 11/5: 0.63ms; 12/11/17: 15.33s = 0.65 m/s with spc; 01/22/18: self-selected: 14.4s = 0.69 m/s, fastest: 11.8s = 0.85 m/s; 02/26/18: self-selected: 13.3s = 0.75 m/s, fastest: 10.6s = 0.94 m/s; 06/07/18: self-selected: 14.0s = 0.71 m/s, fastest: 11.7s = 0.85 m/s; 07/30/18: self selected: 12.45s = 0.830m, fastest:8.84s = 1.1392m8/20: 1.1 m/s with SPC    Time  12    Period  Weeks    Status  Achieved      PT LONG TERM GOAL #6   Title  Patient will be mod I for negotiating curb with LRAD while wearing prosthetic for safe community ambulation.    Baseline  8/20: patient not able to negotiate curbs 10/22: able to negotiate curl with SPC per patient report    Time  12    Period  Weeks    Status  Achieved  Plan - 11/15/18 1729    Clinical Impression Statement  Patient improving with gait capacity and capacity for functional mobility with patient performign 2 laps equaling > 2000 ft  With two rest breaks. Patient requires rolling of R posterior musculature for pain relief s/p ambulation. scheduled. Pt will benefit from PT services to address deficits in strength, balance, and mobility in order to return to full function at home    Examination-Participation Restrictions  Interpersonal Relationship    Rehab Potential  Fair    Clinical Impairments Affecting Rehab Potential  (+) previous independence, motivation to return to walking (-) lives alone, hx of diabetes, limited vision     PT Frequency  2x / week    PT Duration  8 weeks    PT  Treatment/Interventions  ADLs/Self Care Home Management;Cryotherapy;Electrical Stimulation;Ultrasound;Traction;Moist Heat;DME Instruction;Gait training;Stair training;Functional mobility training;Therapeutic activities;Therapeutic exercise;Patient/family education;Neuromuscular re-education;Balance training;Prosthetic Training;Wheelchair mobility training;Manual techniques;Manual lymph drainage;Compression bandaging;Taping;Energy conservation;Passive range of motion    PT Next Visit Plan  progression of SLS on prosthetic limb,    PT Home Exercise Plan  see [id number] on medbridge, spc ambulation    Consulted and Agree with Plan of Care  Patient       Patient will benefit from skilled therapeutic intervention in order to improve the following deficits and impairments:  Abnormal gait, Decreased activity tolerance, Decreased balance, Decreased knowledge of precautions, Decreased endurance, Decreased knowledge of use of DME, Decreased mobility, Decreased range of motion, Difficulty walking, Decreased strength, Increased edema, Impaired flexibility, Impaired perceived functional ability, Prosthetic Dependency, Postural dysfunction, Improper body mechanics, Pain  Visit Diagnosis: Unsteadiness on feet  Other abnormalities of gait and mobility  Muscle weakness (generalized)  Unilateral complete BKA, right, sequela ([place])     Problem List Patient Active Problem List   Diagnosis Date Noted  . Pain due to onychomycosis of toenail of right foot 07/05/2018  . Abnormality of gait 10/12/2017  . Poorly controlled type 2 diabetes mellitus with peripheral neuropathy ([place])   . Flatulence   . Hypomagnesemia   . Unilateral complete BKA, right, sequela ([place])   . Benign essential HTN   . Hypoalbuminemia due to protein-calorie malnutrition ([place])   . S/P below knee amputation, right ([place]) 04/12/2017  . Acute blood loss anemia   . Other encephalopathy 04/08/2017  . Encephalopathy 04/08/2017  . Altered mental  status 04/08/2017  . Labile blood pressure   . Labile blood glucose   . Drug induced constipation   . Stage 3 chronic kidney disease   . Bacteremia   . S/P unilateral BKA (below knee amputation), right ([place]) 04/04/2017  . PAD (peripheral artery disease) ([place])   . Type 2 diabetes mellitus with right diabetic foot ulcer ([place])   . Post-operative pain   . Legally blind   . Upper GI bleed   . Streptococcal bacteremia 04/01/2017  . Hypokalemia 03/30/2017  . Uncontrolled type 2 diabetes mellitus with hyperglycemia, with long-term current use of insulin ([place]) 03/30/2017  . Type 2 diabetes mellitus with peripheral neuropathy ([place]) 03/30/2017  . AKI (acute kidney injury) ([place]) 03/30/2017  . CKD stage 3 due to type 2 diabetes mellitus ([place]) 03/30/2017  . Sepsis ([place]) 03/30/2017  . Heart murmur 11/22/2016  . Hyperlipidemia associated with type 2 diabetes mellitus ([place]) 11/22/2016  . Obstructive sleep apnea syndrome 11/22/2016  . Essential hypertension 12/17/2012   Janna Arch, PT, DPT   11/15/2018, 5:31 PM  [place] MAIN [facility] SERVICES [place],  [place], [address] Phone: [phone]   Fax:  [phone]  Name: Trevor [name] MRN: [id number] Date of Birth: 09-05-1955

## 2018-11-19 ENCOUNTER — Ambulatory Visit: Payer: Medicare Other | Admitting: Podiatry

## 2018-11-20 ENCOUNTER — Other Ambulatory Visit: Payer: Self-pay

## 2018-11-20 ENCOUNTER — Ambulatory Visit: Payer: Medicare Other

## 2018-11-20 VITALS — BP 118/67 | HR 57

## 2018-11-20 DIAGNOSIS — R2681 Unsteadiness on feet: Secondary | ICD-10-CM

## 2018-11-20 DIAGNOSIS — R2689 Other abnormalities of gait and mobility: Secondary | ICD-10-CM

## 2018-11-20 DIAGNOSIS — M6281 Muscle weakness (generalized): Secondary | ICD-10-CM

## 2018-11-20 NOTE — Therapy (Signed)
[place] MAIN [facility] SERVICES [address] [place], [place], [address] Phone: [phone]   Fax:  [phone]  Physical Therapy Treatment  Patient Details  Name: Trevor [name] MRN: [id number] Date of Birth: [birth year] No data recorded  Encounter Date: 11/20/2018  PT End of Session - 11/20/18 1441    Visit Number  49    Number of Visits  91    Date for PT Re-Evaluation  11/22/18    Authorization Type  4/10 PN 10/22    PT Start Time  1442    PT Stop Time  1515    PT Time Calculation (min)  33 min    Equipment Utilized During Treatment  Gait belt   Rt BKA prosthesis   Activity Tolerance  Patient tolerated treatment well;Patient limited by fatigue    Behavior During Therapy  WFL for tasks assessed/performed       Past Medical History:  Diagnosis Date  . Diabetes mellitus without complication ([place])   . Heart murmur   . Hyperlipidemia   . Hypertension   . Sleep apnea     Past Surgical History:  Procedure Laterality Date  . AMPUTATION Right 03/31/2017   Procedure: RIGHT FOOT 1ST AND 2ND RAY AMPUTATION;  Surgeon: Newt Minion, MD;  Location: [place];  Service: Orthopedics;  Laterality: Right;  . AMPUTATION Right 04/01/2017   Procedure: AMPUTATION BELOW KNEE;  Surgeon: Newt Minion, MD;  Location: [place];  Service: Orthopedics;  Laterality: Right;  . COLONOSCOPY WITH PROPOFOL N/A 10/02/2017   Procedure: COLONOSCOPY WITH PROPOFOL;  Surgeon: [name], MD;  Location: [facility] ENDOSCOPY;  Service: Gastroenterology;  Laterality: N/A;  . CORNEAL TRANSPLANT      Vitals:   11/20/18 1446  BP: 118/67  Pulse: (!) 57  SpO2: 99%    Subjective Assessment - 11/20/18 1444    Subjective  Patietn reports no pain or soreness today.  No falls since last visit.  He reports that his appt for his heel went well, without concerns.    Pertinent History  63 year old right-handed male with history of diabetes mellitus, hypertension, legally blind, and CKD  presenting for BKA 04/01/17. Patient wears contacts and can see better now.  Patient had two surgeries, one was a couple toes then they decided to go below the knee. 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 Has ambulated with RW in hospital with PT. Patient has had home health therapy and was in inpatient rehab about a month. Patient reports he was mainly sitting down, with occasional walking, last home health therapy in May. Patient reports some standing up but no exercises. Patient lives alone in the house and is scared they might fall. Patient received prosthetic leg Tuesday of last week.    Limitations  Standing;Walking;House hold activities;Other (comment)    How long can you stand comfortably?  10-15 minutes    How long can you walk comfortably?  10-15 minutes    Patient Stated Goals  going up steps and driving car.     Currently in Pain?  No/denies    Pain Onset  --        TREATMENT  Neuromuscular Re-education  1000'x2 of gait withintermittentcommands of head turns.  He displays an increased cadence today with equal step length. He requires 2 intermittent breaks throughout due to fatigue, however is able to ambulate further today than in the  past before seated rest breaks.Fatigue monitored throughout distance.SPC utilized during 0% of intervention.  Therapeutic Exercise:  Seated: Blue theraband (BTB): hamstring curls 20x each LE BTB adduction 20x each LE  BTB abdcution 20x performed bilaterally BTB knee extension x20 each LE x10 STS from standard height chair with RUE on arm rest and LUE pushing through thigh   Pt educated throughout session about proper posture and technique with exercises. Improved exercise technique, movement at target joints, use of target muscles after min to mod verbal, visual, tactile cues.   Pt demonstrates good motivation throughout today's session.  Pt continues to  demonstrate improvements in his cadence, scanning, and endurance, displaying the ability to ambulate further today than before requiring a rest break.  He also demonstrates the ability to complete his ambulation without a SPC today, however CGA was provided throughout. Pt will benefit from PT services to address deficits in strength, balance, and mobility in order to return to full function at home.                             PT Short Term Goals - 08/30/18 1701      PT SHORT TERM GOAL #1   Title  Patient will be independent in home exercise program to improve strength/mobility for better functional independence with ADLs.    Baseline  HEP given, 12/11/17: Performing daily:     Time  2    Period  Weeks    Status  Achieved      PT SHORT TERM GOAL #2   Title  Patient will don/doff prosthesis independently to allow for increased mobility in home.     Baseline  8/14: requires PT to guide/direct through process 8/28: independent    Time  2    Period  Weeks    Status  Achieved      PT SHORT TERM GOAL #3   Title  Patient will ambulate 10 meters with least assistive device and prosthesis to improve mobility in home.     Baseline  8/14: not walking outside of // bars yet  8/28: 63 seconds with RW and CGA     Time  2    Period  Weeks    Status  Achieved      PT SHORT TERM GOAL #4   Title  Patient will perform STS with single UE support to decrease reliance upon UE's for stability     Baseline  patient requires BUE support 11/5: 1 UR support    Time  2    Period  Weeks    Status  Achieved        PT Long Term Goals - 11/01/18 1504      PT LONG TERM GOAL #1   Title  Patient will ambulate >1046f during 683m with LRAD to demonstrate improved community ambulation and functional mobility    Baseline  11/5: will complete next session; 01/22/18: 400' in 3 minutes but had to stop secondary to pain in knee; 01/22/18: 400' in 3 minutes limited by R knee pain, 02/26/18: 500' in  3 minutes limited by low back pain; 06/07/18: Deferred due to new prosthetic; 07/30/18: 800' with SPC (500' after 3 min)- 2 rest breaks, and ended 30 seconds early due to knee and back pain 8/20 : 600 ft in 3 minutes and 20 seconds; terminated after this due to back pain 10/22: 635 ft with SPC, terminated early due to back pain  Time  12    Period  Weeks    Status  Partially Met    Target Date  11/22/18      PT LONG TERM GOAL #2   Title  Patient (> 67 years old) will complete five times sit to stand test in < 15 seconds indicating an increased LE strength and improved balance.    Baseline  8/14: 61 seconds 8/28; 42 seconds 9/17: 24 seconds BUE support with LLE 10/10: 17 seconds with excessive BUE support  11/5: 18seconds BUE support; 12/11/17: 12/11/17: 18.5s with BUE support but no AD, 01/22/18: 17.1s BUE support but no AD; 02/26/18: 13.0s with BUE, still unable to perform without UE assistance; 06/07/18: 25.4s with single UE support during first rep only; 07/30/18: 24.3s with single UE support during 1st rep only 8/20: 10 seconds BUE support, 16 seconds SUE support 10/22: 15 seconds: 3 with no UE support, 2 with UE support due to one Posterior LOB.    Time  12    Period  Weeks    Status  Partially Met    Target Date  11/22/18      PT LONG TERM GOAL #3   Title  Patient will reduce timed up and go to <11 seconds to reduce fall risk and demonstrate improved transfer/gait ability.    Baseline  8/14: unable to perform 8/28: 1 min 30 seconds 9/17: 38 seconds RW 10/10: 25 seconds with RW 11/5: 20 seconds with cane;, 12/11/17: 17.4s with spc; 01/22/18: 14.8s with spc; 02/26/18: 13.0s with spc; 06/07/18: 16.1s with RW; 07/30/18: 12.44s with SPC 8/20: 11 seconds with SPC    Time  12    Period  Weeks    Status  Achieved      PT LONG TERM GOAL #4   Title  Patient will increase lower extremity functional scale to >60/80 to demonstrate improved functional mobility and increased tolerance with ADLs.     Baseline   814: 29/80 8/28: 20/80 10/10: 20/80 11/5: 22/80; 12/11/17: 18/80; 01/22/18: 27/80; 06/07/18: 30/80; 07/30/18: 35/80 8/20: 36/80 10/22: 36/80    Time  12    Period  Weeks    Status  Partially Met    Target Date  11/22/18      PT LONG TERM GOAL #5   Title  Patient will perform 10 MWT in >1.0 m/s for improved mobility and safety with negotiating natural environment    Baseline  9/17: .53 m/s 10/10: .63 m/s with RW 11/5: 0.14ms; 12/11/17: 15.33s = 0.65 m/s with spc; 01/22/18: self-selected: 14.4s = 0.69 m/s, fastest: 11.8s = 0.85 m/s; 02/26/18: self-selected: 13.3s = 0.75 m/s, fastest: 10.6s = 0.94 m/s; 06/07/18: self-selected: 14.0s = 0.71 m/s, fastest: 11.7s = 0.85 m/s; 07/30/18: self selected: 12.45s = 0.839m, fastest:8.84s = 1.1321m8/20: 1.1 m/s with SPC    Time  12    Period  Weeks    Status  Achieved      PT LONG TERM GOAL #6   Title  Patient will be mod I for negotiating curb with LRAD while wearing prosthetic for safe community ambulation.    Baseline  8/20: patient not able to negotiate curbs 10/22: [facility] negotiate curl [facility]    Time  12    Period  Weeks    Status  Achieved            Plan - 11/20/18 1538    Clinical Impression Statement  Pt demonstrates good motivation throughout today's session.  Pt continues to demonstrate improvements in his cadence, scanning, and endurance, displaying the ability to ambulate further today before requiring a rest break.  He also demonstrates the ability to complete his ambulation without a SPC today, however CGA was provided throughout. Pt will benefit from PT services to address deficits in strength, balance, and mobility in order to return to full function at home.    Examination-Participation Restrictions  Interpersonal Relationship    Rehab Potential  Fair    Clinical Impairments Affecting Rehab Potential  (+) previous independence, motivation to return to walking (-) lives alone, hx of diabetes, limited vision     PT  Frequency  2x / week    PT Duration  8 weeks    PT Treatment/Interventions  ADLs/Self Care Home Management;Cryotherapy;Electrical Stimulation;Ultrasound;Traction;Moist Heat;DME Instruction;Gait training;Stair training;Functional mobility training;Therapeutic activities;Therapeutic exercise;Patient/family education;Neuromuscular re-education;Balance training;Prosthetic Training;Wheelchair mobility training;Manual techniques;Manual lymph drainage;Compression bandaging;Taping;Energy conservation;Passive range of motion    PT Next Visit Plan  Recertification due next session; progression of SLS on prosthetic limb,    PT Home Exercise Plan  see [id number] on medbridge, spc ambulation    Consulted and Agree with Plan of Care  Patient       Patient will benefit from skilled therapeutic intervention in order to improve the following deficits and impairments:  Abnormal gait, Decreased activity tolerance, Decreased balance, Decreased knowledge of precautions, Decreased endurance, Decreased knowledge of use of DME, Decreased mobility, Decreased range of motion, Difficulty walking, Decreased strength, Increased edema, Impaired flexibility, Impaired perceived functional ability, Prosthetic Dependency, Postural dysfunction, Improper body mechanics, Pain  Visit Diagnosis: Unsteadiness on feet  Other abnormalities of gait and mobility  Muscle weakness (generalized)     Problem List Patient Active Problem List   Diagnosis Date Noted  . Pain due to onychomycosis of toenail of right foot 07/05/2018  . Abnormality of gait 10/12/2017  . Poorly controlled type 2 diabetes mellitus with peripheral neuropathy ([place])   . Flatulence   . Hypomagnesemia   . Unilateral complete BKA, right, sequela ([place])   . Benign essential HTN   . Hypoalbuminemia due to protein-calorie malnutrition ([place])   . S/P below knee amputation, right ([place]) 04/12/2017  . Acute blood loss anemia   . Other encephalopathy 04/08/2017  .  Encephalopathy 04/08/2017  . Altered mental status 04/08/2017  . Labile blood pressure   . Labile blood glucose   . Drug induced constipation   . Stage 3 chronic kidney disease   . Bacteremia   . S/P unilateral BKA (below knee amputation), right ([place]) 04/04/2017  . PAD (peripheral artery disease) ([place])   . Type 2 diabetes mellitus with right diabetic foot ulcer ([place])   . Post-operative pain   . Legally blind   . Upper GI bleed   . Streptococcal bacteremia 04/01/2017  . Hypokalemia 03/30/2017  . Uncontrolled type 2 diabetes mellitus with hyperglycemia, with long-term current use of insulin ([place]) 03/30/2017  . Type 2 diabetes mellitus with peripheral neuropathy ([place]) 03/30/2017  . AKI (acute kidney injury) ([place]) 03/30/2017  . CKD stage 3 due to type 2 diabetes mellitus ([place]) 03/30/2017  . Sepsis ([place]) 03/30/2017  . Heart murmur 11/22/2016  . Hyperlipidemia associated with type 2 diabetes mellitus ([place]) 11/22/2016  . Obstructive sleep apnea syndrome 11/22/2016  . Essential hypertension 12/17/2012    This entire session was performed under direct supervision and direction of a licensed therapist/therapist assistant . I have personally read, edited and approve of the note as written.   Lutricia Horsfall,  SPT Phillips Grout PT, DPT, GCS  Huprich,Jason 11/21/2018, 10:14 AM  [place] MAIN [facility] SERVICES [address] [place], [place], [address] Phone: [phone]   Fax:  [phone]  Name: Trevor [name] MRN: [id number] Date of Birth: 03-18-1955

## 2018-11-22 ENCOUNTER — Other Ambulatory Visit: Payer: Self-pay

## 2018-11-22 ENCOUNTER — Ambulatory Visit: Payer: Medicare Other

## 2018-11-22 DIAGNOSIS — R2689 Other abnormalities of gait and mobility: Secondary | ICD-10-CM

## 2018-11-22 DIAGNOSIS — R2681 Unsteadiness on feet: Secondary | ICD-10-CM | POA: Diagnosis not present

## 2018-11-22 DIAGNOSIS — S88111S Complete traumatic amputation at level between knee and ankle, right lower leg, sequela: Secondary | ICD-10-CM

## 2018-11-22 DIAGNOSIS — M6281 Muscle weakness (generalized): Secondary | ICD-10-CM

## 2018-11-22 NOTE — Therapy (Signed)
[place] MAIN [facility] SERVICES [address] [place], [place], [address] Phone: [phone]   Fax:  [phone]  Physical Therapy Treatment/ RECERT  Patient Details  Name: Trevor [name] MRN: [id number] Date of Birth: [birth year] No data recorded  Encounter Date: 11/22/2018  PT End of Session - 11/22/18 1644    Visit Number  28    Number of Visits  109    Date for PT Re-Evaluation  02/14/19    Authorization Type  5/10 PN 10/22    PT Start Time  1345    PT Stop Time  1424    PT Time Calculation (min)  39 min    Equipment Utilized During Treatment  Gait belt   Rt BKA prosthesis   Activity Tolerance  Patient tolerated treatment well;Patient limited by fatigue    Behavior During Therapy  WFL for tasks assessed/performed       Past Medical History:  Diagnosis Date  . Diabetes mellitus without complication ([place])   . Heart murmur   . Hyperlipidemia   . Hypertension   . Sleep apnea     Past Surgical History:  Procedure Laterality Date  . AMPUTATION Right 03/31/2017   Procedure: RIGHT FOOT 1ST AND 2ND RAY AMPUTATION;  Surgeon: Newt Minion, MD;  Location: [place];  Service: Orthopedics;  Laterality: Right;  . AMPUTATION Right 04/01/2017   Procedure: AMPUTATION BELOW KNEE;  Surgeon: Newt Minion, MD;  Location: [place];  Service: Orthopedics;  Laterality: Right;  . COLONOSCOPY WITH PROPOFOL N/A 10/02/2017   Procedure: COLONOSCOPY WITH PROPOFOL;  Surgeon: [name], MD;  Location: [facility] ENDOSCOPY;  Service: Gastroenterology;  Laterality: N/A;  . CORNEAL TRANSPLANT      There were no vitals filed for this visit.  Subjective Assessment - 11/22/18 1516    Subjective  Patient reports he feels better about: carrying packages, walking a little bit more without cane, walking a longer distance now but still limited. Wants to get legs stronger. Having difficulty getting up/down from lowered chairs without UE support. Walking without back hurting for  longer distances.    Pertinent History  63 year old right-handed male with history of diabetes mellitus, hypertension, legally blind, and CKD presenting for BKA 04/01/17. Patient wears contacts and can see better now.  Patient had two surgeries, one was a couple toes then they decided to go below the knee. 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 Has ambulated with RW in hospital with PT. Patient has had home health therapy and was in inpatient rehab about a month. Patient reports he was mainly sitting down, with occasional walking, last home health therapy in May. Patient reports some standing up but no exercises. Patient lives alone in the house and is scared they might fall. Patient received prosthetic leg Tuesday of last week.    Limitations  Standing;Walking;House hold activities;Other (comment)    How long can you stand comfortably?  10-15 minutes    How long can you walk comfortably?  10-15 minutes    Patient Stated Goals  going up steps and driving car.     Currently in Pain?  No/denies       Vitals: 134/77 pulse 64    6 MWT-ask just to walk as far as can without pain; walked 4 minutes prior to pain   5x STS: 13 seconds SUE support   SLS: RLE 4 seconds  x2 trials.   LEFS: 38/80   Treat:   Ambulate without SPC 60 ft x 2 with focus on equal arm swing and upright posture   Swiss ball forward rollout 10x  Seated hamstring stretch 2x 60 seconds each LE  Education and performance on sit to stand transfers from varying heights, challenging to push through prosthetic limb.   Wants to get legs stronger. Having difficulty getting up/down from lowered chairs without UE support. Walking without back hurting for longer distances.   Feels better about: carrying packages, walking a little bit more without cane, walking a longer distance now but still limited.      Due to patient progression with good compliance and  motivation with limitation only with transfers requiring UE support and prolonged ambulation one more recert will be needed/requested to make patient functional and safe with mobility. He is improving with ability to perform sit to stands decreasing time to 13 seconds with SUE support however is unable to perform a sit to stand transfer without using UE due to his height causing his knees to be below 90 90 angle. Pt will benefit from PT services to address deficits in strength, balance, and mobility in order to return to full function at home                   PT Education - 11/22/18 1644    Education Details  goals, POC    Person(s) Educated  Patient    Methods  Explanation    Comprehension  Verbalized understanding       PT Short Term Goals - 11/22/18 1651      PT SHORT TERM GOAL #1   Title  Patient will be independent in home exercise program to improve strength/mobility for better functional independence with ADLs.    Baseline  HEP given, 12/11/17: Performing daily:     Time  2    Period  Weeks    Status  Achieved      PT SHORT TERM GOAL #2   Title  Patient will don/doff prosthesis independently to allow for increased mobility in home.     Baseline  8/14: requires PT to guide/direct through process 8/28: independent    Time  2    Period  Weeks    Status  Achieved      PT SHORT TERM GOAL #3   Title  Patient will ambulate 10 meters with least assistive device and prosthesis to improve mobility in home.     Baseline  8/14: not walking outside of // bars yet  8/28: 63 seconds with RW and CGA     Time  2    Period  Weeks    Status  Achieved      PT SHORT TERM GOAL #4   Title  Patient will perform STS with single UE support to decrease reliance upon UE's for stability     Baseline  patient requires BUE support 11/5: 1 UR support    Time  2    Period  Weeks    Status  Achieved        PT Long Term Goals - 11/22/18 1359      PT LONG TERM GOAL #1   Title   Patient will ambulate >1043f during 682m with LRAD to demonstrate improved community ambulation and functional mobility    Baseline  11/5: will complete next session; 01/22/18: 400' in 3 minutes but had to stop secondary to pain in knee; 01/22/18: 400'  in 3 minutes limited by R knee pain, 02/26/18: 500' in 3 minutes limited by low back pain; 06/07/18: Deferred due to new prosthetic; 07/30/18: 800' with SPC (500' after 3 min)- 2 rest breaks, and ended 30 seconds early due to knee and back pain 8/20 : 600 ft in 3 minutes and 20 seconds; terminated after this due to back pain 10/22: 635 ft with SPC, terminated early due to back pain 11/12: able to ambulate 4 minutes however terminated due to back pain and fatigue    Time  12    Period  Weeks    Status  Partially Met    Target Date  02/14/19      PT LONG TERM GOAL #2   Title  Patient (> 83 years old) will complete five times sit to stand test in < 15 seconds indicating an increased LE strength and improved balance.    Baseline  8/14: 61 seconds 8/28; 42 seconds 9/17: 24 seconds BUE support with LLE 10/10: 17 seconds with excessive BUE support  11/5: 18seconds BUE support; 12/11/17: 12/11/17: 18.5s with BUE support but no AD, 01/22/18: 17.1s BUE support but no AD; 02/26/18: 13.0s with BUE, still unable to perform without UE assistance; 06/07/18: 25.4s with single UE support during first rep only; 07/30/18: 24.3s with single UE support during 1st rep only 8/20: 10 seconds BUE support, 16 seconds SUE support 10/22: 15 seconds: 3 with no UE support, 2 with UE support due to one Posterior LOB. 11/12: 13 seconds SUE support    Time  12    Period  Weeks    Status  Achieved      PT LONG TERM GOAL #3   Title  Patient will reduce timed up and go to <11 seconds to reduce fall risk and demonstrate improved transfer/gait ability.    Baseline  8/14: unable to perform 8/28: 1 min 30 seconds 9/17: 38 seconds RW 10/10: 25 seconds with RW 11/5: 20 seconds with cane;, 12/11/17: 17.4s  with spc; 01/22/18: 14.8s with spc; 02/26/18: 13.0s with spc; 06/07/18: 16.1s with RW; 07/30/18: 12.44s with SPC 8/20: 11 seconds with SPC    Time  12    Period  Weeks    Status  Achieved      PT LONG TERM GOAL #4   Title  Patient will increase lower extremity functional scale to >60/80 to demonstrate improved functional mobility and increased tolerance with ADLs.     Baseline  814: 29/80 8/28: 20/80 10/10: 20/80 11/5: 22/80; 12/11/17: 18/80; 01/22/18: 27/80; 06/07/18: 30/80; 07/30/18: 35/80 8/20: 36/80 10/22: 36/80 11/12: 38/80    Time  12    Period  Weeks    Status  Partially Met    Target Date  02/14/19      PT LONG TERM GOAL #5   Title  Patient will perform 10 MWT in >1.0 m/s for improved mobility and safety with negotiating natural environment    Baseline  9/17: .53 m/s 10/10: .63 m/s with RW 11/5: 0.62ms; 12/11/17: 15.33s = 0.65 m/s with spc; 01/22/18: self-selected: 14.4s = 0.69 m/s, fastest: 11.8s = 0.85 m/s; 02/26/18: self-selected: 13.3s = 0.75 m/s, fastest: 10.6s = 0.94 m/s; 06/07/18: self-selected: 14.0s = 0.71 m/s, fastest: 11.7s = 0.85 m/s; 07/30/18: self selected: 12.45s = 0.885m, fastest:8.84s = 1.1366m8/20: 1.1 m/s with SPC    Time  12    Period  Weeks    Status  Achieved      PT LONG TERM GOAL #6  Title  Patient will be mod I for negotiating curb with LRAD while wearing prosthetic for safe community ambulation.    Baseline  8/20: patient not able to negotiate curbs 10/22: able to negotiate curl with SPC per patient report    Time  12    Period  Weeks    Status  Achieved      PT LONG TERM GOAL #7   Title  Patient (> 57 years old) will complete five times sit to stand test without UE support  in < 15 seconds indicating an increased LE strength and improved balance.    Baseline  11/12: 13 seconds with SUE support unable to perform 1 STS without UE support    Time  12    Period  Weeks    Status  New    Target Date  02/14/19      PT LONG TERM GOAL #8   Title  Patient will  tolerate SLS on prosthetic limb for 10 seconds for progression of stability with functional mobility    Baseline  11/12: 4 seconds    Time  12    Period  Weeks    Status  New    Target Date  02/14/19            Plan - 11/22/18 1646    Clinical Impression Statement  Due to patient progression with good compliance and motivation with limitation only with transfers requiring UE support and prolonged ambulation one more recert will be needed/requested to make patient functional and safe with mobility. He is improving with ability to perform sit to stands decreasing time to 13 seconds with SUE support however is unable to perform a sit to stand transfer without using UE due to his height causing his knees to be below 90 90 angle. Pt will benefit from PT services to address deficits in strength, balance, and mobility in order to return to full function at home    Examination-Participation Restrictions  Interpersonal Relationship    Rehab Potential  Fair    Clinical Impairments Affecting Rehab Potential  (+) previous independence, motivation to return to walking (-) lives alone, hx of diabetes, limited vision     PT Frequency  2x / week    PT Duration  8 weeks    PT Treatment/Interventions  ADLs/Self Care Home Management;Cryotherapy;Electrical Stimulation;Ultrasound;Traction;Moist Heat;DME Instruction;Gait training;Stair training;Functional mobility training;Therapeutic activities;Therapeutic exercise;Patient/family education;Neuromuscular re-education;Balance training;Prosthetic Training;Wheelchair mobility training;Manual techniques;Manual lymph drainage;Compression bandaging;Taping;Energy conservation;Passive range of motion    PT Next Visit Plan  leg press progression of SLS on prosthetic limb,    PT Home Exercise Plan  see [id number] on [facility], spc ambulation    Consulted and Agree with Plan of Care  Patient       Patient will benefit from skilled therapeutic intervention in order to  improve the following deficits and impairments:  Abnormal gait, Decreased activity tolerance, Decreased balance, Decreased knowledge of precautions, Decreased endurance, Decreased knowledge of use of DME, Decreased mobility, Decreased range of motion, Difficulty walking, Decreased strength, Increased edema, Impaired flexibility, Impaired perceived functional ability, Prosthetic Dependency, Postural dysfunction, Improper body mechanics, Pain  Visit Diagnosis: Unsteadiness on feet  Other abnormalities of gait and mobility  Muscle weakness (generalized)  Unilateral complete BKA, right, sequela ([place])     Problem List Patient Active Problem List   Diagnosis Date Noted  . Pain due to onychomycosis of toenail of right foot 07/05/2018  . Abnormality of gait 10/12/2017  . Poorly controlled type 2 diabetes  mellitus with peripheral neuropathy ([place])   . Flatulence   . Hypomagnesemia   . Unilateral complete BKA, right, sequela ([place])   . Benign essential HTN   . Hypoalbuminemia due to protein-calorie malnutrition ([place])   . S/P below knee amputation, right ([place]) 04/12/2017  . Acute blood loss anemia   . Other encephalopathy 04/08/2017  . Encephalopathy 04/08/2017  . Altered mental status 04/08/2017  . Labile blood pressure   . Labile blood glucose   . Drug induced constipation   . Stage 3 chronic kidney disease   . Bacteremia   . S/P unilateral BKA (below knee amputation), right ([place]) 04/04/2017  . PAD (peripheral artery disease) ([place])   . Type 2 diabetes mellitus with right diabetic foot ulcer ([place])   . Post-operative pain   . Legally blind   . Upper GI bleed   . Streptococcal bacteremia 04/01/2017  . Hypokalemia 03/30/2017  . Uncontrolled type 2 diabetes mellitus with hyperglycemia, with long-term current use of insulin ([place]) 03/30/2017  . Type 2 diabetes mellitus with peripheral neuropathy ([place]) 03/30/2017  . AKI (acute kidney injury) ([place]) 03/30/2017  . CKD stage 3 due to type 2  diabetes mellitus ([place]) 03/30/2017  . Sepsis ([place]) 03/30/2017  . Heart murmur 11/22/2016  . Hyperlipidemia associated with type 2 diabetes mellitus ([place]) 11/22/2016  . Obstructive sleep apnea syndrome 11/22/2016  . Essential hypertension 12/17/2012   Janna Arch, PT, DPT   11/22/2018, 4:54 PM  [place] MAIN [facility] SERVICES [address] [place], [place], [address] Phone: [phone]   Fax:  [phone]  Name: CLEE PANDIT MRN: [id number] Date of Birth: [birth year]

## 2018-11-27 ENCOUNTER — Ambulatory Visit: Payer: Medicare Other

## 2018-11-27 ENCOUNTER — Other Ambulatory Visit: Payer: Self-pay

## 2018-11-27 DIAGNOSIS — R2681 Unsteadiness on feet: Secondary | ICD-10-CM

## 2018-11-27 DIAGNOSIS — R2689 Other abnormalities of gait and mobility: Secondary | ICD-10-CM

## 2018-11-27 DIAGNOSIS — M6281 Muscle weakness (generalized): Secondary | ICD-10-CM

## 2018-11-27 NOTE — Therapy (Signed)
[place] MAIN [facility] SERVICES [address] [place], [place], [address] Phone: [phone]   Fax:  [phone]  Physical Therapy Treatment  Patient Details  Name: Trevor [name] MRN: [id number] Date of Birth: 1955/05/01 No data recorded  Encounter Date: 11/27/2018  PT End of Session - 11/27/18 1441    Visit Number  [id number]    Number of Visits  109    Date for PT Re-Evaluation  02/14/19    Authorization Type  6/10 PN 10/22    PT Start Time  1434    PT Stop Time  1515    PT Time Calculation (min)  41 min    Equipment Utilized During Treatment  Gait belt   Rt BKA prosthesis   Activity Tolerance  Patient tolerated treatment well;Patient limited by fatigue    Behavior During Therapy  WFL for tasks assessed/performed       Past Medical History:  Diagnosis Date  . Diabetes mellitus without complication ([place])   . Heart murmur   . Hyperlipidemia   . Hypertension   . Sleep apnea     Past Surgical History:  Procedure Laterality Date  . AMPUTATION Right 03/31/2017   Procedure: RIGHT FOOT 1ST AND 2ND RAY AMPUTATION;  Surgeon: Newt Minion, MD;  Location: [place];  Service: Orthopedics;  Laterality: Right;  . AMPUTATION Right 04/01/2017   Procedure: AMPUTATION BELOW KNEE;  Surgeon: Newt Minion, MD;  Location: [place];  Service: Orthopedics;  Laterality: Right;  . COLONOSCOPY WITH PROPOFOL N/A 10/02/2017   Procedure: COLONOSCOPY WITH PROPOFOL;  Surgeon: [name], MD;  Location: [facility] ENDOSCOPY;  Service: Gastroenterology;  Laterality: N/A;  . CORNEAL TRANSPLANT      There were no vitals filed for this visit.  Subjective Assessment - 11/27/18 1437    Subjective  Patient reports taking his blood pressure pill yesterday morning instead of night.  No falls or LOB since last session.    Pertinent History  63 year old right-handed male with history of diabetes mellitus, hypertension, legally blind, and CKD presenting for BKA 04/01/17. Patient wears  contacts and can see better now.  Patient had two surgeries, one was a couple toes then they decided to go below the knee. 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 Has ambulated with RW in hospital with PT. Patient has had home health therapy and was in inpatient rehab about a month. Patient reports he was mainly sitting down, with occasional walking, last home health therapy in May. Patient reports some standing up but no exercises. Patient lives alone in the house and is scared they might fall. Patient received prosthetic leg Tuesday of last week.    Limitations  Standing;Walking;House hold activities;Other (comment)    How long can you stand comfortably?  10-15 minutes    How long can you walk comfortably?  10-15 minutes    Patient Stated Goals  going up steps and driving car.     Currently in Pain?  No/denies           Vitals:  155/85 pulse 67     Ther-ex  Quantum leg press BLE 210 x15 2 sets  cueing for keeping slight bent in knee with decreasing velocity for increased muscle recruitment.     Quantum leg press single leg (one leg at a time) 105# x 15, 2 sets each LE ; cueing for keeping slight bent  in knee with decreasing velocity for increased muscle recruitment.  Focus on controlled eccentric muscle recruitment  TrA muscle contraction against swiss ball with pressure between knees and arms 10x 3 second holds  BTB hamstring curls 12x each LE.    Ambulate 40x2 ft without AD, good weight shift initially, second trial decreased weight acceptance onto prosthetic limb  Examination of plantar aspect of L foot. Patient has noticeable cracks throughout plantar surface. No open wounds at this time, will continue to benefit from weekly monitoring at PT sessions due to patient difficulty monitoring at home.    Pt educated throughout session about proper posture and technique with exercises. Improved exercise technique,  movement at target joints, use of target muscles after min to mod verbal, visual, tactile cues.   Patient demonstrates excellent motivation towards LE strengthening throughout session. He continues to rely more on LLE than prosthetic limb when fatigued. Pt will benefit from PT services to address deficits in strength, balance, and mobility in order to return to full function at home             PT Education - 11/27/18 1441    Education Details  exercise technique, body mechanics.    Person(s) Educated  Patient    Methods  Explanation;Demonstration;Tactile cues    Comprehension  Verbalized understanding;Returned demonstration;Verbal cues required;Tactile cues required       PT Short Term Goals - 11/22/18 1651      PT SHORT TERM GOAL #1   Title  Patient will be independent in home exercise program to improve strength/mobility for better functional independence with ADLs.    Baseline  HEP given, 12/11/17: Performing daily:     Time  2    Period  Weeks    Status  Achieved      PT SHORT TERM GOAL #2   Title  Patient will don/doff prosthesis independently to allow for increased mobility in home.     Baseline  8/14: requires PT to guide/direct through process 8/28: independent    Time  2    Period  Weeks    Status  Achieved      PT SHORT TERM GOAL #3   Title  Patient will ambulate 10 meters with least assistive device and prosthesis to improve mobility in home.     Baseline  8/14: not walking outside of // bars yet  8/28: 63 seconds with RW and CGA     Time  2    Period  Weeks    Status  Achieved      PT SHORT TERM GOAL #4   Title  Patient will perform STS with single UE support to decrease reliance upon UE's for stability     Baseline  patient requires BUE support 11/5: 1 UR support    Time  2    Period  Weeks    Status  Achieved        PT Long Term Goals - 11/22/18 1359      PT LONG TERM GOAL #1   Title  Patient will ambulate >1052f during 672m with LRAD to  demonstrate improved community ambulation and functional mobility    Baseline  11/5: will complete next session; 01/22/18: 400' in 3 minutes but had to stop secondary to pain in knee; 01/22/18: 400' in 3 minutes limited by R knee pain, 02/26/18: 500' in 3 minutes limited by low back pain; 06/07/18: Deferred due to new prosthetic; 07/30/18: 800' with SPC (500' after 3 min)- 2 rest  breaks, and ended 30 seconds early due to knee and back pain 8/20 : 600 ft in 3 minutes and 20 seconds; terminated after this due to back pain 10/22: 635 ft with SPC, terminated early due to back pain 11/12: able to ambulate 4 minutes however terminated due to back pain and fatigue    Time  12    Period  Weeks    Status  Partially Met    Target Date  02/14/19      PT LONG TERM GOAL #2   Title  Patient (> 13 years old) will complete five times sit to stand test in < 15 seconds indicating an increased LE strength and improved balance.    Baseline  8/14: 61 seconds 8/28; 42 seconds 9/17: 24 seconds BUE support with LLE 10/10: 17 seconds with excessive BUE support  11/5: 18seconds BUE support; 12/11/17: 12/11/17: 18.5s with BUE support but no AD, 01/22/18: 17.1s BUE support but no AD; 02/26/18: 13.0s with BUE, still unable to perform without UE assistance; 06/07/18: 25.4s with single UE support during first rep only; 07/30/18: 24.3s with single UE support during 1st rep only 8/20: 10 seconds BUE support, 16 seconds SUE support 10/22: 15 seconds: 3 with no UE support, 2 with UE support due to one Posterior LOB. 11/12: 13 seconds SUE support    Time  12    Period  Weeks    Status  Achieved      PT LONG TERM GOAL #3   Title  Patient will reduce timed up and go to <11 seconds to reduce fall risk and demonstrate improved transfer/gait ability.    Baseline  8/14: unable to perform 8/28: 1 min 30 seconds 9/17: 38 seconds RW 10/10: 25 seconds with RW 11/5: 20 seconds with cane;, 12/11/17: 17.4s with spc; 01/22/18: 14.8s with spc; 02/26/18: 13.0s with  spc; 06/07/18: 16.1s with RW; 07/30/18: 12.44s with SPC 8/20: 11 seconds with SPC    Time  12    Period  Weeks    Status  Achieved      PT LONG TERM GOAL #4   Title  Patient will increase lower extremity functional scale to >60/80 to demonstrate improved functional mobility and increased tolerance with ADLs.     Baseline  814: 29/80 8/28: 20/80 10/10: 20/80 11/5: 22/80; 12/11/17: 18/80; 01/22/18: 27/80; 06/07/18: 30/80; 07/30/18: 35/80 8/20: 36/80 10/22: 36/80 11/12: 38/80    Time  12    Period  Weeks    Status  Partially Met    Target Date  02/14/19      PT LONG TERM GOAL #5   Title  Patient will perform 10 MWT in >1.0 m/s for improved mobility and safety with negotiating natural environment    Baseline  9/17: .53 m/s 10/10: .63 m/s with RW 11/5: 0.4ms; 12/11/17: 15.33s = 0.65 m/s with spc; 01/22/18: self-selected: 14.4s = 0.69 m/s, fastest: 11.8s = 0.85 m/s; 02/26/18: self-selected: 13.3s = 0.75 m/s, fastest: 10.6s = 0.94 m/s; 06/07/18: self-selected: 14.0s = 0.71 m/s, fastest: 11.7s = 0.85 m/s; 07/30/18: self selected: 12.45s = 0.891m, fastest:8.84s = 1.136m8/20: 1.1 m/s with SPC    Time  12    Period  Weeks    Status  Achieved      PT LONG TERM GOAL #6   Title  Patient will be mod I for negotiating curb with LRAD while wearing prosthetic for safe community ambulation.    Baseline  8/20: patient not able to negotiate curbs 10/22: able  to negotiate curl with SPC per patient report    Time  12    Period  Weeks    Status  Achieved      PT LONG TERM GOAL #7   Title  Patient (> 53 years old) will complete five times sit to stand test without UE support  in < 15 seconds indicating an increased LE strength and improved balance.    Baseline  11/12: 13 seconds with SUE support unable to perform 1 STS without UE support    Time  12    Period  Weeks    Status  New    Target Date  02/14/19      PT LONG TERM GOAL #8   Title  Patient will tolerate SLS on prosthetic limb for 10 seconds for  progression of stability with functional mobility    Baseline  11/12: 4 seconds    Time  12    Period  Weeks    Status  New    Target Date  02/14/19            Plan - 11/27/18 1446    Clinical Impression Statement  Patient demonstrates excellent motivation towards LE strengthening throughout session. He continues to rely mor eon LLE than prosthetic limb when fatigued. Pt will benefit from PT services to address deficits in strength, balance, and mobility in order to return to full function at home    Examination-Participation Restrictions  Interpersonal Relationship    Rehab Potential  Fair    Clinical Impairments Affecting Rehab Potential  (+) previous independence, motivation to return to walking (-) lives alone, hx of diabetes, limited vision     PT Frequency  2x / week    PT Duration  8 weeks    PT Treatment/Interventions  ADLs/Self Care Home Management;Cryotherapy;Electrical Stimulation;Ultrasound;Traction;Moist Heat;DME Instruction;Gait training;Stair training;Functional mobility training;Therapeutic activities;Therapeutic exercise;Patient/family education;Neuromuscular re-education;Balance training;Prosthetic Training;Wheelchair mobility training;Manual techniques;Manual lymph drainage;Compression bandaging;Taping;Energy conservation;Passive range of motion    PT Next Visit Plan  leg press progression of SLS on prosthetic limb,    PT Home Exercise Plan  see [id number] on medbridge, spc ambulation    Consulted and Agree with Plan of Care  Patient       Patient will benefit from skilled therapeutic intervention in order to improve the following deficits and impairments:  Abnormal gait, Decreased activity tolerance, Decreased balance, Decreased knowledge of precautions, Decreased endurance, Decreased knowledge of use of DME, Decreased mobility, Decreased range of motion, Difficulty walking, Decreased strength, Increased edema, Impaired flexibility, Impaired perceived functional ability,  Prosthetic Dependency, Postural dysfunction, Improper body mechanics, Pain  Visit Diagnosis: Unsteadiness on feet  Other abnormalities of gait and mobility  Muscle weakness (generalized)     Problem List Patient Active Problem List   Diagnosis Date Noted  . Pain due to onychomycosis of toenail of right foot 07/05/2018  . Abnormality of gait 10/12/2017  . Poorly controlled type 2 diabetes mellitus with peripheral neuropathy ([place])   . Flatulence   . Hypomagnesemia   . Unilateral complete BKA, right, sequela ([place])   . Benign essential HTN   . Hypoalbuminemia due to protein-calorie malnutrition ([place])   . S/P below knee amputation, right ([place]) 04/12/2017  . Acute blood loss anemia   . Other encephalopathy 04/08/2017  . Encephalopathy 04/08/2017  . Altered mental status 04/08/2017  . Labile blood pressure   . Labile blood glucose   . Drug induced constipation   . Stage 3 chronic kidney disease   .  Bacteremia   . S/P unilateral BKA (below knee amputation), right ([place]) 04/04/2017  . PAD (peripheral artery disease) ([place])   . Type 2 diabetes mellitus with right diabetic foot ulcer ([place])   . Post-operative pain   . Legally blind   . Upper GI bleed   . Streptococcal bacteremia 04/01/2017  . Hypokalemia 03/30/2017  . Uncontrolled type 2 diabetes mellitus with hyperglycemia, with long-term current use of insulin ([place]) 03/30/2017  . Type 2 diabetes mellitus with peripheral neuropathy ([place]) 03/30/2017  . AKI (acute kidney injury) ([place]) 03/30/2017  . CKD stage 3 due to type 2 diabetes mellitus ([place]) 03/30/2017  . Sepsis ([place]) 03/30/2017  . Heart murmur 11/22/2016  . Hyperlipidemia associated with type 2 diabetes mellitus ([place]) 11/22/2016  . Obstructive sleep apnea syndrome 11/22/2016  . Essential hypertension 12/17/2012   Janna Arch, PT, DPT   11/27/2018, 3:48 PM  [place] MAIN [facility] SERVICES [address] [place], [place],  [address] Phone: [phone]   Fax:  [phone]  Name: Trevor [name] MRN: [id number] Date of Birth: 1955-07-30

## 2018-11-29 ENCOUNTER — Ambulatory Visit: Payer: Medicare Other

## 2018-11-29 ENCOUNTER — Other Ambulatory Visit: Payer: Self-pay

## 2018-11-29 DIAGNOSIS — R2681 Unsteadiness on feet: Secondary | ICD-10-CM | POA: Diagnosis not present

## 2018-11-29 DIAGNOSIS — M6281 Muscle weakness (generalized): Secondary | ICD-10-CM

## 2018-11-29 DIAGNOSIS — S88111S Complete traumatic amputation at level between knee and ankle, right lower leg, sequela: Secondary | ICD-10-CM

## 2018-11-29 DIAGNOSIS — R2689 Other abnormalities of gait and mobility: Secondary | ICD-10-CM

## 2018-11-29 NOTE — Therapy (Signed)
[place] MAIN [facility] SERVICES [address] [place], [place], [address] Phone: [phone]   Fax:  [phone]  Physical Therapy Treatment  Patient Details  Name: Trevor [name] MRN: [id number] Date of Birth: September 30, 1955 No data recorded  Encounter Date: 11/29/2018  PT End of Session - 11/29/18 1516    Visit Number  87    Number of Visits  109    Date for PT Re-Evaluation  02/14/19    Authorization Type  7/10 PN 10/22    PT Start Time  1432    PT Stop Time  1514    PT Time Calculation (min)  42 min    Equipment Utilized During Treatment  Gait belt   Rt BKA prosthesis   Activity Tolerance  Patient tolerated treatment well;Patient limited by pain    Behavior During Therapy  WFL for tasks assessed/performed       Past Medical History:  Diagnosis Date  . Diabetes mellitus without complication ([place])   . Heart murmur   . Hyperlipidemia   . Hypertension   . Sleep apnea     Past Surgical History:  Procedure Laterality Date  . AMPUTATION Right 03/31/2017   Procedure: RIGHT FOOT 1ST AND 2ND RAY AMPUTATION;  Surgeon: Newt Minion, MD;  Location: [place];  Service: Orthopedics;  Laterality: Right;  . AMPUTATION Right 04/01/2017   Procedure: AMPUTATION BELOW KNEE;  Surgeon: Newt Minion, MD;  Location: [name];  Service: Orthopedics;  Laterality: Right;  . COLONOSCOPY WITH PROPOFOL N/A 10/02/2017   Procedure: COLONOSCOPY WITH PROPOFOL;  Surgeon: [name], MD;  Location: [facility] ENDOSCOPY;  Service: Gastroenterology;  Laterality: N/A;  . CORNEAL TRANSPLANT      There were no vitals filed for this visit.  Subjective Assessment - 11/29/18 1456    Subjective  Patient reports continued pain in plantar aspect of L foot. Has been applying his cream his doctor prescribed.    Pertinent History  63 year old right-handed male with history of diabetes mellitus, hypertension, legally blind, and CKD presenting for BKA 04/01/17. Patient wears contacts and  can see better now.  Patient had two surgeries, one was a couple toes then they decided to go below the knee. 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 Has ambulated with RW in hospital with PT. Patient has had home health therapy and was in inpatient rehab about a month. Patient reports he was mainly sitting down, with occasional walking, last home health therapy in May. Patient reports some standing up but no exercises. Patient lives alone in the house and is scared they might fall. Patient received prosthetic leg Tuesday of last week.    Limitations  Standing;Walking;House hold activities;Other (comment)    How long can you stand comfortably?  10-15 minutes    How long can you walk comfortably?  10-15 minutes    Patient Stated Goals  going up steps and driving car.     Currently in Pain?  Yes    Pain Score  6     Pain Location  Foot    Pain Orientation  Left    Pain Descriptors / Indicators  Throbbing    Pain Type  Acute pain          Vitals: BP: 147/78 pulse 62   Ambulate 160 ft, terminated due to R foot pain increasing with repeated weightbearing. R foot assessed, increased crack  length noted, pictures taken for patient to inform physician.    Supine with wedge due to R foot pain   3lb ankle weight -straight leg abduction 15x each LE ; opp LE in hooklying for reduction of back pain -SLR to 45 degrees/PT hand for guidance 10x each LE -march 12x each LE -bolster under knees: SAQ 15x each LE  10 bridges with PT stabilizing LE  GTB hip abduction 15x  TrA contraction pressing swiss ball between knees and hands. 10x 3 second holds   Hamstring stretch 60 second holds, LLE more limited than right.   LE rotation in hooklying 60 seconds   Examination of plantar aspect of L foot. Patient has noticeable cracks throughout plantar surface. No open wounds at this time, will continue to benefit from weekly monitoring at  PT sessions due to patient difficulty monitoring at home.    Pt educated throughout session about proper posture and technique with exercises. Improved exercise technique, movement at target joints, use of target muscles after min to mod verbal, visual, tactile cues.     Patient's session limited by L foot pain. Nonweightbearing interventions performed for LE strength. Patient fatigued quickly requiring rest breaks between interventions. Patient agreeable to talk to physician about increasing pain in left foot.  Pt will benefit from PT services to address deficits in strength, balance, and mobility in order to return to full function at home                   PT Education - 11/29/18 1516    Education Details  exercise technique, body mechanics    Person(s) Educated  Patient    Methods  Explanation;Demonstration;Tactile cues;Verbal cues    Comprehension  Verbalized understanding;Returned demonstration;Verbal cues required;Tactile cues required       PT Short Term Goals - 11/22/18 1651      PT SHORT TERM GOAL #1   Title  Patient will be independent in home exercise program to improve strength/mobility for better functional independence with ADLs.    Baseline  HEP given, 12/11/17: Performing daily:     Time  2    Period  Weeks    Status  Achieved      PT SHORT TERM GOAL #2   Title  Patient will don/doff prosthesis independently to allow for increased mobility in home.     Baseline  8/14: requires PT to guide/direct through process 8/28: independent    Time  2    Period  Weeks    Status  Achieved      PT SHORT TERM GOAL #3   Title  Patient will ambulate 10 meters with least assistive device and prosthesis to improve mobility in home.     Baseline  8/14: not walking outside of // bars yet  8/28: 63 seconds with RW and CGA     Time  2    Period  Weeks    Status  Achieved      PT SHORT TERM GOAL #4   Title  Patient will perform STS with single UE support to decrease  reliance upon UE's for stability     Baseline  patient requires BUE support 11/5: 1 UR support    Time  2    Period  Weeks    Status  Achieved        PT Long Term Goals - 11/22/18 1359      PT LONG TERM GOAL #1   Title  Patient will ambulate >1038f during  7mt with LRAD to demonstrate improved community ambulation and functional mobility    Baseline  11/5: will complete next session; 01/22/18: 400' in 3 minutes but had to stop secondary to pain in knee; 01/22/18: 400' in 3 minutes limited by R knee pain, 02/26/18: 500' in 3 minutes limited by low back pain; 06/07/18: Deferred due to new prosthetic; 07/30/18: 800' with SPC (500' after 3 min)- 2 rest breaks, and ended 30 seconds early due to knee and back pain 8/20 : 600 ft in 3 minutes and 20 seconds; terminated after this due to back pain 10/22: 635 ft with SPC, terminated early due to back pain 11/12: able to ambulate 4 minutes however terminated due to back pain and fatigue    Time  12    Period  Weeks    Status  Partially Met    Target Date  02/14/19      PT LONG TERM GOAL #2   Title  Patient (> [age]years old) will complete five times sit to stand test in < 15 seconds indicating an increased LE strength and improved balance.    Baseline  8/14: 61 seconds 8/28; 42 seconds 9/17: 24 seconds BUE support with LLE 10/10: 17 seconds with excessive BUE support  11/5: 18seconds BUE support; 12/11/17: 12/11/17: 18.5s with BUE support but no AD, 01/22/18: 17.1s BUE support but no AD; 02/26/18: 13.0s with BUE, still unable to perform without UE assistance; 06/07/18: 25.4s with single UE support during first rep only; 07/30/18: 24.3s with single UE support during 1st rep only 8/20: 10 seconds BUE support, 16 seconds SUE support 10/22: 15 seconds: 3 with no UE support, 2 with UE support due to one Posterior LOB. 11/12: 13 seconds SUE support    Time  12    Period  Weeks    Status  Achieved      PT LONG TERM GOAL #3   Title  Patient will reduce timed up and go  to <11 seconds to reduce fall risk and demonstrate improved transfer/gait ability.    Baseline  8/14: unable to perform 8/28: 1 min 30 seconds 9/17: 38 seconds RW 10/10: 25 seconds with RW 11/5: 20 seconds with cane;, 12/11/17: 17.4s with spc; 01/22/18: 14.8s with spc; 02/26/18: 13.0s with spc; 06/07/18: 16.1s with RW; 07/30/18: 12.44s with SPC 8/20: 11 seconds with SPC    Time  12    Period  Weeks    Status  Achieved      PT LONG TERM GOAL #4   Title  Patient will increase lower extremity functional scale to >60/80 to demonstrate improved functional mobility and increased tolerance with ADLs.     Baseline  814: 29/80 8/28: 20/80 10/10: 20/80 11/5: 22/80; 12/11/17: 18/80; 01/22/18: 27/80; 06/07/18: 30/80; 07/30/18: 35/80 8/20: 36/80 10/22: 36/80 11/12: 38/80    Time  12    Period  Weeks    Status  Partially Met    Target Date  02/14/19      PT LONG TERM GOAL #5   Title  Patient will perform 10 MWT in >1.0 m/s for improved mobility and safety with negotiating natural environment    Baseline  9/17: .53 m/s 10/10: .63 m/s with RW 11/5: 0.66m; 12/11/17: 15.33s = 0.65 m/s with spc; 01/22/18: self-selected: 14.4s = 0.69 m/s, fastest: 11.8s = 0.85 m/s; 02/26/18: self-selected: 13.3s = 0.75 m/s, fastest: 10.6s = 0.94 m/s; 06/07/18: self-selected: 14.0s = 0.71 m/s, fastest: 11.7s = 0.85 m/s; 07/30/18: self selected: 12.45s = 0.8066m  fastest:8.84s = 1.62ms 8/20: 1.1 m/s with SPC    Time  12    Period  Weeks    Status  Achieved      PT LONG TERM GOAL #6   Title  Patient will be mod I for negotiating curb with LRAD while wearing prosthetic for safe community ambulation.    Baseline  8/20: patient not able to negotiate curbs 10/22: able to negotiate curl with SPC per patient report    Time  12    Period  Weeks    Status  Achieved      PT LONG TERM GOAL #7   Title  Patient (> 739years old) will complete five times sit to stand test without UE support  in < 15 seconds indicating an increased LE strength and  improved balance.    Baseline  11/12: 13 seconds with SUE support unable to perform 1 STS without UE support    Time  12    Period  Weeks    Status  New    Target Date  02/14/19      PT LONG TERM GOAL #8   Title  Patient will tolerate SLS on prosthetic limb for 10 seconds for progression of stability with functional mobility    Baseline  11/12: 4 seconds    Time  12    Period  Weeks    Status  New    Target Date  02/14/19            Plan - 11/29/18 1517    Clinical Impression Statement  Patient's session limited by L foot pain. Nonweightbearing interventions performed for LE strength. Patient fatigued quickly requiring rest breaks between interventions. Patient agreeable to talk to physician about increasing pain in left foot.  Pt will benefit from PT services to address deficits in strength, balance, and mobility in order to return to full function at home    Examination-Participation Restrictions  Interpersonal Relationship    Rehab Potential  Fair    Clinical Impairments Affecting Rehab Potential  (+) previous independence, motivation to return to walking (-) lives alone, hx of diabetes, limited vision     PT Frequency  2x / week    PT Duration  8 weeks    PT Treatment/Interventions  ADLs/Self Care Home Management;Cryotherapy;Electrical Stimulation;Ultrasound;Traction;Moist Heat;DME Instruction;Gait training;Stair training;Functional mobility training;Therapeutic activities;Therapeutic exercise;Patient/family education;Neuromuscular re-education;Balance training;Prosthetic Training;Wheelchair mobility training;Manual techniques;Manual lymph drainage;Compression bandaging;Taping;Energy conservation;Passive range of motion    PT Next Visit Plan  leg press progression of SLS on prosthetic limb,    PT Home Exercise Plan  see [id number] on medbridge, spc ambulation    Consulted and Agree with Plan of Care  Patient       Patient will benefit from skilled therapeutic intervention in  order to improve the following deficits and impairments:  Abnormal gait, Decreased activity tolerance, Decreased balance, Decreased knowledge of precautions, Decreased endurance, Decreased knowledge of use of DME, Decreased mobility, Decreased range of motion, Difficulty walking, Decreased strength, Increased edema, Impaired flexibility, Impaired perceived functional ability, Prosthetic Dependency, Postural dysfunction, Improper body mechanics, Pain  Visit Diagnosis: Unsteadiness on feet  Other abnormalities of gait and mobility  Muscle weakness (generalized)  Unilateral complete BKA, right, sequela ([place]     Problem List Patient Active Problem List   Diagnosis Date Noted  . Pain due to onychomycosis of toenail of right foot 07/05/2018  . Abnormality of gait 10/12/2017  . Poorly controlled type 2 diabetes mellitus with peripheral neuropathy ([place]   .  Flatulence   . Hypomagnesemia   . Unilateral complete BKA, right, sequela ([place])   . Benign essential HTN   . Hypoalbuminemia due to protein-calorie malnutrition ([place])   . S/P below knee amputation, right ([place]) 04/12/2017  . Acute blood loss anemia   . Other encephalopathy 04/08/2017  . Encephalopathy 04/08/2017  . Altered mental status 04/08/2017  . Labile blood pressure   . Labile blood glucose   . Drug induced constipation   . Stage 3 chronic kidney disease   . Bacteremia   . S/P unilateral BKA (below knee amputation), right ([place]) 04/04/2017  . PAD (peripheral artery disease) ([place])   . Type 2 diabetes mellitus with right diabetic foot ulcer ([place])   . Post-operative pain   . Legally blind   . Upper GI bleed   . Streptococcal bacteremia 04/01/2017  . Hypokalemia 03/30/2017  . Uncontrolled type 2 diabetes mellitus with hyperglycemia, with long-term current use of insulin ([place]) 03/30/2017  . Type 2 diabetes mellitus with peripheral neuropathy ([place]) 03/30/2017  . AKI (acute kidney injury) ([place]) 03/30/2017  . CKD stage 3 due to  type 2 diabetes mellitus ([place]) 03/30/2017  . Sepsis ([place]) 03/30/2017  . Heart murmur 11/22/2016  . Hyperlipidemia associated with type 2 diabetes mellitus ([place]) 11/22/2016  . Obstructive sleep apnea syndrome 11/22/2016  . Essential hypertension 12/17/2012   Janna Arch, PT, DPT   11/29/2018, 3:18 PM  [place] MAIN [facility] SERVICES [address] [place], [place], [address] Phone: [phone]   Fax:  [phone]  Name: Trevor [name] MRN: [id number] Date of Birth: 12/05/1955

## 2018-12-04 ENCOUNTER — Ambulatory Visit: Payer: Medicare Other

## 2018-12-04 ENCOUNTER — Other Ambulatory Visit: Payer: Self-pay

## 2018-12-04 DIAGNOSIS — R2681 Unsteadiness on feet: Secondary | ICD-10-CM | POA: Diagnosis not present

## 2018-12-04 DIAGNOSIS — M6281 Muscle weakness (generalized): Secondary | ICD-10-CM

## 2018-12-04 DIAGNOSIS — R2689 Other abnormalities of gait and mobility: Secondary | ICD-10-CM

## 2018-12-04 DIAGNOSIS — S88111S Complete traumatic amputation at level between knee and ankle, right lower leg, sequela: Secondary | ICD-10-CM

## 2018-12-04 NOTE — Therapy (Signed)
[place] MAIN [facility] SERVICES [address] [place], [place], [address] Phone: [phone]   Fax:  [phone]  Physical Therapy Treatment  Patient Details  Name: Trevor [name] MRN: [id number] Date of Birth: Jul 16, 1955 No data recorded  Encounter Date: 12/04/2018  PT End of Session - 12/04/18 1504    Visit Number  88    Number of Visits  109    Date for PT Re-Evaluation  02/14/19    Authorization Type  8/10 PN 10/22    PT Start Time  1437    PT Stop Time  1515    PT Time Calculation (min)  38 min    Equipment Utilized During Treatment  Gait belt   Rt BKA prosthesis   Activity Tolerance  Patient tolerated treatment well    Behavior During Therapy  WFL for tasks assessed/performed       Past Medical History:  Diagnosis Date  . Diabetes mellitus without complication ([place])   . Heart murmur   . Hyperlipidemia   . Hypertension   . Sleep apnea     Past Surgical History:  Procedure Laterality Date  . AMPUTATION Right 03/31/2017   Procedure: RIGHT FOOT 1ST AND 2ND RAY AMPUTATION;  Surgeon: Newt Minion, MD;  Location: [place];  Service: Orthopedics;  Laterality: Right;  . AMPUTATION Right 04/01/2017   Procedure: AMPUTATION BELOW KNEE;  Surgeon: Newt Minion, MD;  Location: [place];  Service: Orthopedics;  Laterality: Right;  . COLONOSCOPY WITH PROPOFOL N/A 10/02/2017   Procedure: COLONOSCOPY WITH PROPOFOL;  Surgeon: [name], MD;  Location: [facility] ENDOSCOPY;  Service: Gastroenterology;  Laterality: N/A;  . CORNEAL TRANSPLANT      There were no vitals filed for this visit.  Subjective Assessment - 12/04/18 1457    Subjective  Patient reports compliance with HEP. Not having as much foot pain today, no falls or LOB since last session.    Pertinent History  63 year old right-handed male with history of diabetes mellitus, hypertension, legally blind, and CKD presenting for BKA 04/01/17. Patient wears contacts and can see better now.   Patient had two surgeries, one was a couple toes then they decided to go below the knee. 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 Has ambulated with RW in hospital with PT. Patient has had home health therapy and was in inpatient rehab about a month. Patient reports he was mainly sitting down, with occasional walking, last home health therapy in May. Patient reports some standing up but no exercises. Patient lives alone in the house and is scared they might fall. Patient received prosthetic leg Tuesday of last week.    Limitations  Standing;Walking;House hold activities;Other (comment)    How long can you stand comfortably?  10-15 minutes    How long can you walk comfortably?  10-15 minutes    Patient Stated Goals  going up steps and driving car.     Currently in Pain?  Yes    Pain Score  1     Pain Location  Foot    Pain Orientation  Left    Pain Descriptors / Indicators  Stabbing;Squeezing    Pain Type  Acute pain    Pain Onset  In the past 7 days    Pain Frequency  Intermittent        vitals:      vitals: 121/70 pulse 61  Ther-ex  Quantum leg press BLE 210 x15 2 sets  cueing for keeping slight bent in knee with decreasing velocity for increased muscle recruitment.     Quantum leg press single leg (one leg at a time) 105# x 15, 2 sets each LE ; cueing for keeping slight bent in knee with decreasing velocity for increased muscle recruitment.  Focus on controlled eccentric muscle recruitment   TrA muscle contraction against swiss ball with pressure between knees and arms 10x 3 second holds   Ambulate >1000 ft with SPC, focus on increasing gait speed with equal step length and functional body mechanics, one seated rest break; CGA and cueing for body mechanics.   Examination of plantar aspect of L foot. Patient has noticeable cracks throughout plantar surface.slight open wound between big toe and second toe,  photo taken and patient aware, will see doctor on monday will continue to benefit from weekly monitoring at PT sessions due to patient difficulty monitoring at home.    Pt educated throughout session about proper posture and technique with exercises. Improved exercise technique, movement at target joints, use of target muscles after min to mod verbal, visual, tactile cues.     Patient demonstrates excellent motivation towards LE strengthening throughout session. He continues to rely more on LLE than prosthetic limb when fatigued. Pt will benefit from PT services to address deficits in strength, balance, and mobility in order to return to full function at home    Patient presents with excellent motivation to physical therapy session. L foot continues to require monitoring with new crack/opening noted and patient made aware. Will see physician Monday about it. Patient slightly late limiting full intervention scale. Pt will benefit from PT services to address deficits in strength, balance, and mobility in order to return to full function at home             PT Education - 12/04/18 1459    Education Details  exercise technique, body mechanics.    Person(s) Educated  Patient    Methods  Explanation;Demonstration;Tactile cues;Verbal cues    Comprehension  Verbalized understanding;Returned demonstration;Verbal cues required;Tactile cues required       PT Short Term Goals - 11/22/18 1651      PT SHORT TERM GOAL #1   Title  Patient will be independent in home exercise program to improve strength/mobility for better functional independence with ADLs.    Baseline  HEP given, 12/11/17: Performing daily:     Time  2    Period  Weeks    Status  Achieved      PT SHORT TERM GOAL #2   Title  Patient will don/doff prosthesis independently to allow for increased mobility in home.     Baseline  8/14: requires PT to guide/direct through process 8/28: independent    Time  2    Period  Weeks     Status  Achieved      PT SHORT TERM GOAL #3   Title  Patient will ambulate 10 meters with least assistive device and prosthesis to improve mobility in home.     Baseline  8/14: not walking outside of // bars yet  8/28: 63 seconds with RW and CGA     Time  2    Period  Weeks    Status  Achieved      PT SHORT TERM GOAL #4   Title  Patient will perform STS with single UE support to decrease reliance upon UE's for stability  Baseline  patient requires BUE support 11/5: 1 UR support    Time  2    Period  Weeks    Status  Achieved        PT Long Term Goals - 11/22/18 1359      PT LONG TERM GOAL #1   Title  Patient will ambulate >1022f during 665m with LRAD to demonstrate improved community ambulation and functional mobility    Baseline  11/5: will complete next session; 01/22/18: 400' in 3 minutes but had to stop secondary to pain in knee; 01/22/18: 400' in 3 minutes limited by R knee pain, 02/26/18: 500' in 3 minutes limited by low back pain; 06/07/18: Deferred due to new prosthetic; 07/30/18: 800' with SPC (500' after 3 min)- 2 rest breaks, and ended 30 seconds early due to knee and back pain 8/20 : 600 ft in 3 minutes and 20 seconds; terminated after this due to back pain 10/22: 635 ft with SPC, terminated early due to back pain 11/12: able to ambulate 4 minutes however terminated due to back pain and fatigue    Time  12    Period  Weeks    Status  Partially Met    Target Date  02/14/19      PT LONG TERM GOAL #2   Title  Patient (> 6025ears old) will complete five times sit to stand test in < 15 seconds indicating an increased LE strength and improved balance.    Baseline  8/14: 61 seconds 8/28; 42 seconds 9/17: 24 seconds BUE support with LLE 10/10: 17 seconds with excessive BUE support  11/5: 18seconds BUE support; 12/11/17: 12/11/17: 18.5s with BUE support but no AD, 01/22/18: 17.1s BUE support but no AD; 02/26/18: 13.0s with BUE, still unable to perform without UE assistance; 06/07/18:  25.4s with single UE support during first rep only; 07/30/18: 24.3s with single UE support during 1st rep only 8/20: 10 seconds BUE support, 16 seconds SUE support 10/22: 15 seconds: 3 with no UE support, 2 with UE support due to one Posterior LOB. 11/12: 13 seconds SUE support    Time  12    Period  Weeks    Status  Achieved      PT LONG TERM GOAL #3   Title  Patient will reduce timed up and go to <11 seconds to reduce fall risk and demonstrate improved transfer/gait ability.    Baseline  8/14: unable to perform 8/28: 1 min 30 seconds 9/17: 38 seconds RW 10/10: 25 seconds with RW 11/5: 20 seconds with cane;, 12/11/17: 17.4s with spc; 01/22/18: 14.8s with spc; 02/26/18: 13.0s with spc; 06/07/18: 16.1s with RW; 07/30/18: 12.44s with SPC 8/20: 11 seconds with SPC    Time  12    Period  Weeks    Status  Achieved      PT LONG TERM GOAL #4   Title  Patient will increase lower extremity functional scale to >60/80 to demonstrate improved functional mobility and increased tolerance with ADLs.     Baseline  814: 29/80 8/28: 20/80 10/10: 20/80 11/5: 22/80; 12/11/17: 18/80; 01/22/18: 27/80; 06/07/18: 30/80; 07/30/18: 35/80 8/20: 36/80 10/22: 36/80 11/12: 38/80    Time  12    Period  Weeks    Status  Partially Met    Target Date  02/14/19      PT LONG TERM GOAL #5   Title  Patient will perform 10 MWT in >1.0 m/s for improved mobility and safety with negotiating natural environment  Baseline  9/17: .53 m/s 10/10: .63 m/s with RW 11/5: 0.56ms; 12/11/17: 15.33s = 0.65 m/s with spc; 01/22/18: self-selected: 14.4s = 0.69 m/s, fastest: 11.8s = 0.85 m/s; 02/26/18: self-selected: 13.3s = 0.75 m/s, fastest: 10.6s = 0.94 m/s; 06/07/18: self-selected: 14.0s = 0.71 m/s, fastest: 11.7s = 0.85 m/s; 07/30/18: self selected: 12.45s = 0.832m, fastest:8.84s = 1.137m8/20: 1.1 m/s with SPC    Time  12    Period  Weeks    Status  Achieved      PT LONG TERM GOAL #6   Title  Patient will be mod I for negotiating curb with LRAD  while wearing prosthetic for safe community ambulation.    Baseline  8/20: patient not able to negotiate curbs 10/22: able to negotiate curl with SPC per patient report    Time  12    Period  Weeks    Status  Achieved      PT LONG TERM GOAL #7   Title  Patient (> 60 75ars old) will complete five times sit to stand test without UE support  in < 15 seconds indicating an increased LE strength and improved balance.    Baseline  11/12: 13 seconds with SUE support unable to perform 1 STS without UE support    Time  12    Period  Weeks    Status  New    Target Date  02/14/19      PT LONG TERM GOAL #8   Title  Patient will tolerate SLS on prosthetic limb for 10 seconds for progression of stability with functional mobility    Baseline  11/12: 4 seconds    Time  12    Period  Weeks    Status  New    Target Date  02/14/19            Plan - 12/04/18 1505    Clinical Impression Statement  Patient presents with excellent motivation to physical therapy session. L foot continues to require monitoring with new crack/opening noted and patient made aware. Will see physician Monday about it. Patient slightly late limiting full intervention scale. Pt will benefit from PT services to address deficits in strength, balance, and mobility in order to return to full function at home    Examination-Participation Restrictions  Interpersonal Relationship    Rehab Potential  Fair    Clinical Impairments Affecting Rehab Potential  (+) previous independence, motivation to return to walking (-) lives alone, hx of diabetes, limited vision     PT Frequency  2x / week    PT Duration  8 weeks    PT Treatment/Interventions  ADLs/Self Care Home Management;Cryotherapy;Electrical Stimulation;Ultrasound;Traction;Moist Heat;DME Instruction;Gait training;Stair training;Functional mobility training;Therapeutic activities;Therapeutic exercise;Patient/family education;Neuromuscular re-education;Balance training;Prosthetic  Training;Wheelchair mobility training;Manual techniques;Manual lymph drainage;Compression bandaging;Taping;Energy conservation;Passive range of motion    PT Next Visit Plan  leg press progression of SLS on prosthetic limb,    PT Home Exercise Plan  see [id number] on [facility], spc ambulation    Consulted and Agree with Plan of Care  Patient       Patient will benefit from skilled therapeutic intervention in order to improve the following deficits and impairments:  Abnormal gait, Decreased activity tolerance, Decreased balance, Decreased knowledge of precautions, Decreased endurance, Decreased knowledge of use of DME, Decreased mobility, Decreased range of motion, Difficulty walking, Decreased strength, Increased edema, Impaired flexibility, Impaired perceived functional ability, Prosthetic Dependency, Postural dysfunction, Improper body mechanics, Pain  Visit Diagnosis: Unsteadiness on feet  Other abnormalities of  gait and mobility  Muscle weakness (generalized)  Unilateral complete BKA, right, sequela [facility])     Problem List Patient Active Problem List   Diagnosis Date Noted  . Pain due to onychomycosis of toenail of right foot 07/05/2018  . Abnormality of gait 10/12/2017  . Poorly controlled type 2 diabetes mellitus with peripheral neuropathy ([place])   . Flatulence   . Hypomagnesemia   . Unilateral complete BKA, right, sequela ([place])   . Benign essential HTN   . Hypoalbuminemia due to protein-calorie malnutrition ([place])   . S/P below knee amputation, right ([place]) 04/12/2017  . Acute blood loss anemia   . Other encephalopathy 04/08/2017  . Encephalopathy 04/08/2017  . Altered mental status 04/08/2017  . Labile blood pressure   . Labile blood glucose   . Drug induced constipation   . Stage 3 chronic kidney disease   . Bacteremia   . S/P unilateral BKA (below knee amputation), right ([place]) 04/04/2017  . PAD (peripheral artery disease) ([place])   . Type 2 diabetes mellitus with right  diabetic foot ulcer ([place])   . Post-operative pain   . Legally blind   . Upper GI bleed   . Streptococcal bacteremia 04/01/2017  . Hypokalemia 03/30/2017  . Uncontrolled type 2 diabetes mellitus with hyperglycemia, with long-term current use of insulin ([place]) 03/30/2017  . Type 2 diabetes mellitus with peripheral neuropathy ([place]) 03/30/2017  . AKI (acute kidney injury) ([place]) 03/30/2017  . CKD stage 3 due to type 2 diabetes mellitus ([place]) 03/30/2017  . Sepsis ([place]) 03/30/2017  . Heart murmur 11/22/2016  . Hyperlipidemia associated with type 2 diabetes mellitus ([place]) 11/22/2016  . Obstructive sleep apnea syndrome 11/22/2016  . Essential hypertension 12/17/2012   Janna Arch, PT, DPT   12/04/2018, 3:15 PM  [place] MAIN [facility] SERVICES [address] [place], [place], [address] Phone: [phone]   Fax:  [phone]  Name: Trevor [name] MRN: [id number] Date of Birth: 06/08/1955

## 2018-12-11 ENCOUNTER — Other Ambulatory Visit: Payer: Self-pay

## 2018-12-11 ENCOUNTER — Ambulatory Visit: Payer: Medicare Other | Attending: Physical Medicine & Rehabilitation

## 2018-12-11 DIAGNOSIS — S88111S Complete traumatic amputation at level between knee and ankle, right lower leg, sequela: Secondary | ICD-10-CM

## 2018-12-11 DIAGNOSIS — M6281 Muscle weakness (generalized): Secondary | ICD-10-CM | POA: Diagnosis present

## 2018-12-11 DIAGNOSIS — R2681 Unsteadiness on feet: Secondary | ICD-10-CM | POA: Insufficient documentation

## 2018-12-11 DIAGNOSIS — R2689 Other abnormalities of gait and mobility: Secondary | ICD-10-CM | POA: Diagnosis present

## 2018-12-11 NOTE — Therapy (Signed)
[place] MAIN [facility] SERVICES [address] [place], [place], [address] Phone: [phone]   Fax:  [phone]  Physical Therapy Treatment  Patient Details  Name: Trevor [name] MRN: [id number] Date of Birth: 05/06/1955 No data recorded  Encounter Date: 12/11/2018  PT End of Session - 12/11/18 1731    Visit Number  64    Number of Visits  109    Date for PT Re-Evaluation  02/14/19    Authorization Type  9/10 PN 10/22    PT Start Time  1432    PT Stop Time  1514    PT Time Calculation (min)  42 min    Equipment Utilized During Treatment  Gait belt   Rt BKA prosthesis   Activity Tolerance  Patient tolerated treatment well    Behavior During Therapy  WFL for tasks assessed/performed       Past Medical History:  Diagnosis Date  . Diabetes mellitus without complication ([place])   . Heart murmur   . Hyperlipidemia   . Hypertension   . Sleep apnea     Past Surgical History:  Procedure Laterality Date  . AMPUTATION Right 03/31/2017   Procedure: RIGHT FOOT 1ST AND 2ND RAY AMPUTATION;  Surgeon: Newt Minion, MD;  Location: [place];  Service: Orthopedics;  Laterality: Right;  . AMPUTATION Right 04/01/2017   Procedure: AMPUTATION BELOW KNEE;  Surgeon: Newt Minion, MD;  Location: [place];  Service: Orthopedics;  Laterality: Right;  . COLONOSCOPY WITH PROPOFOL N/A 10/02/2017   Procedure: COLONOSCOPY WITH PROPOFOL;  Surgeon: [name], MD;  Location: [facility] ENDOSCOPY;  Service: Gastroenterology;  Laterality: N/A;  . CORNEAL TRANSPLANT      There were no vitals filed for this visit.  Subjective Assessment - 12/11/18 1729    Subjective  Patient reports he has not done his HEP over the holidays. Reports no falls or LOB since last session. Patient reports some heel pain this session.    Pertinent History  63 year old right-handed male with history of diabetes mellitus, hypertension, legally blind, and CKD presenting for BKA 04/01/17. Patient wears  contacts and can see better now.  Patient had two surgeries, one was a couple toes then they decided to go below the knee. 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 Has ambulated with RW in hospital with PT. Patient has had home health therapy and was in inpatient rehab about a month. Patient reports he was mainly sitting down, with occasional walking, last home health therapy in May. Patient reports some standing up but no exercises. Patient lives alone in the house and is scared they might fall. Patient received prosthetic leg Tuesday of last week.    Limitations  Standing;Walking;House hold activities;Other (comment)    How long can you stand comfortably?  10-15 minutes    How long can you walk comfortably?  10-15 minutes    Patient Stated Goals  going up steps and driving car.     Currently in Pain?  Yes    Pain Score  1     Pain Location  Foot    Pain Orientation  Left    Pain Descriptors / Indicators  Stabbing    Pain Type  Acute pain    Pain Onset  In the past 7 days    Pain Frequency  Intermittent  Vitals: 140/76 pulse 66         Ther-ex    7lb dumbbell squats from raised plinth table, 10x  7lb dumbbells held to chest: squat/sit to stands from raised plinth table 10x ; more challenging.   7lb dumbbell overhead raise 10x sit to stand from raised plinth table 10x,    GTB around knees for abduction sit to stand from raised plinth table 8x  TrA muscle contraction against swiss ball with pressure between knees and arms 10x 3 second holds  BTB hamstring curls 15x each LE  BTB eccentric march 15x each LE BTB abduction 15x each LE  30 seconds alternating toe taps seated 4" step .   Seated alternating marches with arms crossed and upright posture 12x each LE.    Ambulate >1000 ft without SPC, focus on increasing gait speed with equal step length and functional body mechanics, one  seated rest break; CGA and cueing for body mechanics.    Examination of plantar aspect of L foot. Patient has noticeable cracks throughout plantar surface.slight open wound between big toe and second toe, additional crack noted on heel; will continue to benefit from weekly monitoring at PT sessions due to patient difficulty monitoring at home.     Pt educated throughout session about proper posture and technique with exercises. Improved exercise technique, movement at target joints, use of target muscles after min to mod verbal, visual, tactile cues.      Patient presents with excellent motivation to physical therapy session. L foot continues to require monitoring with new crack/opening noted and patient made aware. Patient performed sit to stand/squats with varying COM alterations with use of dumbbells.  Pt will benefit from PT services to address deficits in strength, balance, and mobility in order to return to full function at home           PT Education - 12/11/18 1731    Education Details  exercise technique, body mechanics    Person(s) Educated  Patient    Methods  Explanation;Demonstration;Tactile cues;Verbal cues    Comprehension  Verbalized understanding;Returned demonstration;Tactile cues required;Verbal cues required       PT Short Term Goals - 11/22/18 1651      PT SHORT TERM GOAL #1   Title  Patient will be independent in home exercise program to improve strength/mobility for better functional independence with ADLs.    Baseline  HEP given, 12/11/17: Performing daily:     Time  2    Period  Weeks    Status  Achieved      PT SHORT TERM GOAL #2   Title  Patient will don/doff prosthesis independently to allow for increased mobility in home.     Baseline  8/14: requires PT to guide/direct through process 8/28: independent    Time  2    Period  Weeks    Status  Achieved      PT SHORT TERM GOAL #3   Title  Patient will ambulate 10 meters with least assistive device  and prosthesis to improve mobility in home.     Baseline  8/14: not walking outside of // bars yet  8/28: 63 seconds with RW and CGA     Time  2    Period  Weeks    Status  Achieved      PT SHORT TERM GOAL #4   Title  Patient will perform STS with single UE support to decrease reliance upon UE's for stability     Baseline  patient requires BUE support 11/5: 1 UR support    Time  2    Period  Weeks    Status  Achieved        PT Long Term Goals - 11/22/18 1359      PT LONG TERM GOAL #1   Title  Patient will ambulate >1040f during 632m with LRAD to demonstrate improved community ambulation and functional mobility    Baseline  11/5: will complete next session; 01/22/18: 400' in 3 minutes but had to stop secondary to pain in knee; 01/22/18: 400' in 3 minutes limited by R knee pain, 02/26/18: 500' in 3 minutes limited by low back pain; 06/07/18: Deferred due to new prosthetic; 07/30/18: 800' with SPC (500' after 3 min)- 2 rest breaks, and ended 30 seconds early due to knee and back pain 8/20 : 600 ft in 3 minutes and 20 seconds; terminated after this due to back pain 10/22: 635 ft with SPC, terminated early due to back pain 11/12: able to ambulate 4 minutes however terminated due to back pain and fatigue    Time  12    Period  Weeks    Status  Partially Met    Target Date  02/14/19      PT LONG TERM GOAL #2   Title  Patient (> 6074ears old) will complete five times sit to stand test in < 15 seconds indicating an increased LE strength and improved balance.    Baseline  8/14: 61 seconds 8/28; 42 seconds 9/17: 24 seconds BUE support with LLE 10/10: 17 seconds with excessive BUE support  11/5: 18seconds BUE support; 12/11/17: 12/11/17: 18.5s with BUE support but no AD, 01/22/18: 17.1s BUE support but no AD; 02/26/18: 13.0s with BUE, still unable to perform without UE assistance; 06/07/18: 25.4s with single UE support during first rep only; 07/30/18: 24.3s with single UE support during 1st rep only 8/20: 10  seconds BUE support, 16 seconds SUE support 10/22: 15 seconds: 3 with no UE support, 2 with UE support due to one Posterior LOB. 11/12: 13 seconds SUE support    Time  12    Period  Weeks    Status  Achieved      PT LONG TERM GOAL #3   Title  Patient will reduce timed up and go to <11 seconds to reduce fall risk and demonstrate improved transfer/gait ability.    Baseline  8/14: unable to perform 8/28: 1 min 30 seconds 9/17: 38 seconds RW 10/10: 25 seconds with RW 11/5: 20 seconds with cane;, 12/11/17: 17.4s with spc; 01/22/18: 14.8s with spc; 02/26/18: 13.0s with spc; 06/07/18: 16.1s with RW; 07/30/18: 12.44s with SPC 8/20: 11 seconds with SPC    Time  12    Period  Weeks    Status  Achieved      PT LONG TERM GOAL #4   Title  Patient will increase lower extremity functional scale to >60/80 to demonstrate improved functional mobility and increased tolerance with ADLs.     Baseline  814: 29/80 8/28: 20/80 10/10: 20/80 11/5: 22/80; 12/11/17: 18/80; 01/22/18: 27/80; 06/07/18: 30/80; 07/30/18: 35/80 8/20: 36/80 10/22: 36/80 11/12: 38/80    Time  12    Period  Weeks    Status  Partially Met    Target Date  02/14/19      PT LONG TERM GOAL #5   Title  Patient will perform 10 MWT in >1.0 m/s for improved mobility and safety with negotiating natural environment  Baseline  9/17: .53 m/s 10/10: .63 m/s with RW 11/5: 0.29ms; 12/11/17: 15.33s = 0.65 m/s with spc; 01/22/18: self-selected: 14.4s = 0.69 m/s, fastest: 11.8s = 0.85 m/s; 02/26/18: self-selected: 13.3s = 0.75 m/s, fastest: 10.6s = 0.94 m/s; 06/07/18: self-selected: 14.0s = 0.71 m/s, fastest: 11.7s = 0.85 m/s; 07/30/18: self selected: 12.45s = 0.832m, fastest:8.84s = 1.1358m8/20: 1.1 m/s with SPC    Time  12    Period  Weeks    Status  Achieved      PT LONG TERM GOAL #6   Title  Patient will be mod I for negotiating curb with LRAD while wearing prosthetic for safe community ambulation.    Baseline  8/20: patient not able to negotiate curbs 10/22: able  to negotiate curl with SPC per patient report    Time  12    Period  Weeks    Status  Achieved      PT LONG TERM GOAL #7   Title  Patient (> 60 68ars old) will complete five times sit to stand test without UE support  in < 15 seconds indicating an increased LE strength and improved balance.    Baseline  11/12: 13 seconds with SUE support unable to perform 1 STS without UE support    Time  12    Period  Weeks    Status  New    Target Date  02/14/19      PT LONG TERM GOAL #8   Title  Patient will tolerate SLS on prosthetic limb for 10 seconds for progression of stability with functional mobility    Baseline  11/12: 4 seconds    Time  12    Period  Weeks    Status  New    Target Date  02/14/19            Plan - 12/11/18 1732    Clinical Impression Statement  Patient presents with excellent motivation to physical therapy session. L foot continues to require monitoring with new crack/opening noted and patient made aware. Patient performed sit to stand/squats with varying COM alterations with use of dumbbells.  Pt will benefit from PT services to address deficits in strength, balance, and mobility in order to return to full function at home    Examination-Participation Restrictions  Interpersonal Relationship    Rehab Potential  Fair    Clinical Impairments Affecting Rehab Potential  (+) previous independence, motivation to return to walking (-) lives alone, hx of diabetes, limited vision     PT Frequency  2x / week    PT Duration  8 weeks    PT Treatment/Interventions  ADLs/Self Care Home Management;Cryotherapy;Electrical Stimulation;Ultrasound;Traction;Moist Heat;DME Instruction;Gait training;Stair training;Functional mobility training;Therapeutic activities;Therapeutic exercise;Patient/family education;Neuromuscular re-education;Balance training;Prosthetic Training;Wheelchair mobility training;Manual techniques;Manual lymph drainage;Compression bandaging;Taping;Energy  conservation;Passive range of motion    PT Next Visit Plan  leg press progression of SLS on prosthetic limb,    PT Home Exercise Plan  see [id number] on medbridge, spc ambulation    Consulted and Agree with Plan of Care  Patient       Patient will benefit from skilled therapeutic intervention in order to improve the following deficits and impairments:  Abnormal gait, Decreased activity tolerance, Decreased balance, Decreased knowledge of precautions, Decreased endurance, Decreased knowledge of use of DME, Decreased mobility, Decreased range of motion, Difficulty walking, Decreased strength, Increased edema, Impaired flexibility, Impaired perceived functional ability, Prosthetic Dependency, Postural dysfunction, Improper body mechanics, Pain  Visit Diagnosis: Unsteadiness on feet  Other abnormalities  of gait and mobility  Muscle weakness (generalized)  Unilateral complete BKA, right, sequela [facility])     Problem List Patient Active Problem List   Diagnosis Date Noted  . Pain due to onychomycosis of toenail of right foot 07/05/2018  . Abnormality of gait 10/12/2017  . Poorly controlled type 2 diabetes mellitus with peripheral neuropathy ([place])   . Flatulence   . Hypomagnesemia   . Unilateral complete BKA, right, sequela ([place])   . Benign essential HTN   . Hypoalbuminemia due to protein-calorie malnutrition ([place])   . S/P below knee amputation, right ([place]) 04/12/2017  . Acute blood loss anemia   . Other encephalopathy 04/08/2017  . Encephalopathy 04/08/2017  . Altered mental status 04/08/2017  . Labile blood pressure   . Labile blood glucose   . Drug induced constipation   . Stage 3 chronic kidney disease   . Bacteremia   . S/P unilateral BKA (below knee amputation), right ([place]) 04/04/2017  . PAD (peripheral artery disease) ([place])   . Type 2 diabetes mellitus with right diabetic foot ulcer ([place])   . Post-operative pain   . Legally blind   . Upper GI bleed   . Streptococcal  bacteremia 04/01/2017  . Hypokalemia 03/30/2017  . Uncontrolled type 2 diabetes mellitus with hyperglycemia, with long-term current use of insulin ([place]) 03/30/2017  . Type 2 diabetes mellitus with peripheral neuropathy ([place]) 03/30/2017  . AKI (acute kidney injury) ([place]) 03/30/2017  . CKD stage 3 due to type 2 diabetes mellitus ([place]) 03/30/2017  . Sepsis ([place]) 03/30/2017  . Heart murmur 11/22/2016  . Hyperlipidemia associated with type 2 diabetes mellitus ([place]) 11/22/2016  . Obstructive sleep apnea syndrome 11/22/2016  . Essential hypertension 12/17/2012   Janna Arch, PT, DPT   12/11/2018, 5:33 PM  [place] MAIN [facility] SERVICES [address] [place], [place], [address] Phone: [phone]   Fax:  [phone]  Name: TARAN HAYNESWORTH MRN: [id number] Date of Birth: 22-Nov-1955

## 2018-12-13 ENCOUNTER — Ambulatory Visit: Payer: Medicare Other

## 2018-12-13 ENCOUNTER — Other Ambulatory Visit: Payer: Self-pay

## 2018-12-13 DIAGNOSIS — R2689 Other abnormalities of gait and mobility: Secondary | ICD-10-CM

## 2018-12-13 DIAGNOSIS — M6281 Muscle weakness (generalized): Secondary | ICD-10-CM

## 2018-12-13 DIAGNOSIS — R2681 Unsteadiness on feet: Secondary | ICD-10-CM

## 2018-12-13 DIAGNOSIS — S88111S Complete traumatic amputation at level between knee and ankle, right lower leg, sequela: Secondary | ICD-10-CM

## 2018-12-13 NOTE — Therapy (Signed)
[place] MAIN [facility] SERVICES [address] [place], [place], [address] Phone: [phone]   Fax:  [phone]  Physical Therapy Treatment Physical Therapy Progress Note   Dates of reporting period  11/01/18 to  12/13/18   Patient Details  Name: Trevor [name] MRN: [id number] Date of Birth: [birth year] No data recorded  Encounter Date: 12/13/2018  PT End of Session - 12/13/18 1515    Visit Number  90    Number of Visits  109    Date for PT Re-Evaluation  02/14/19    Authorization Type  10/10 PN 10/22; next session 1/10 PN 12/13/18    PT Start Time  1430    PT Stop Time  1509    PT Time Calculation (min)  39 min    Equipment Utilized During Treatment  Gait belt   Rt BKA prosthesis   Activity Tolerance  Patient tolerated treatment well    Behavior During Therapy  WFL for tasks assessed/performed       Past Medical History:  Diagnosis Date  . Diabetes mellitus without complication ([place])   . Heart murmur   . Hyperlipidemia   . Hypertension   . Sleep apnea     Past Surgical History:  Procedure Laterality Date  . AMPUTATION Right 03/31/2017   Procedure: RIGHT FOOT 1ST AND 2ND RAY AMPUTATION;  Surgeon: Newt Minion, MD;  Location: [facility];  Service: Orthopedics;  Laterality: Right;  . AMPUTATION Right 04/01/2017   Procedure: AMPUTATION BELOW KNEE;  Surgeon: Newt Minion, MD;  Location: [facility];  Service: Orthopedics;  Laterality: Right;  . COLONOSCOPY WITH PROPOFOL N/A 10/02/2017   Procedure: COLONOSCOPY WITH PROPOFOL;  Surgeon: [name], MD;  Location: [facility];  Service: Gastroenterology;  Laterality: N/A;  . CORNEAL TRANSPLANT      There were no vitals filed for this visit.  Subjective Assessment - 12/13/18 1451    Subjective  Patient reports severe pain upon weightbearing in L foot that began last night.    Pertinent History  63 year old right-handed male with history of diabetes mellitus, hypertension, legally blind, and  CKD presenting for BKA 04/01/17. Patient wears contacts and can see better now.  Patient had two surgeries, one was a couple toes then they decided to go below the knee. 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 Has ambulated with RW in hospital with PT. Patient has had home health therapy and was in inpatient rehab about a month. Patient reports he was mainly sitting down, with occasional walking, last home health therapy in May. Patient reports some standing up but no exercises. Patient lives alone in the house and is scared they might fall. Patient received prosthetic leg Tuesday of last week.    Limitations  Standing;Walking;House hold activities;Other (comment)    How long can you stand comfortably?  10-15 minutes    How long can you walk comfortably?  10-15 minutes    Patient Stated Goals  going up steps and driving car.     Currently in Pain?  Yes    Pain Score  9     Pain Location  Foot    Pain Orientation  Left    Pain Descriptors / Indicators  Stabbing    Pain Type  Acute pain    Pain Onset  In the past 7 days       Patient reports severe pain  upon weightbearing in L foot that began last night.   Goals will be assessed next session due to patient's pain limiting weightbearing interventions. Patient agreeable to call physician after session due to noted open crack at medial aspect of heel and warmth of region.   Vitals: 133/65 pulse 60     Supine with wedge due to R foot pain  3lb ankle weight -straight leg abduction 15x each LE ; opp LE in hooklying for reduction of back pain -SLR to 45 degrees/PT hand for guidance 10x each LE -march 12x each LE -bolster under knees: SAQ 15x each LE 3 seconds holds -bolster under knees, adduction squeeze 15x 3 second holds  GTB hip abduction 15x  Hamstring stretch 60 second holds, LLE more limited than right.   LE rotation in hooklying 60 seconds   TrA contraction  pressing swiss ball between knees and hands. 10x 3 second holds in seated   BTB adduction 15x each LE seated BTB marching 15x each LE seated     Examination of plantar aspect of L foot. Patient has noticeable cracks throughout plantar surface. small open wounds at this time at medial aspect of heel, picture taken for patient to send to physician. Will continue to benefit from weekly monitoring at PT sessions due to patient difficulty monitoring at home.  Pt educated throughout session about proper posture and technique with exercises. Improved exercise technique, movement at target joints, use of target muscles after min to mod verbal, visual, tactile cues   Patient's condition has the potential to improve in response to therapy. Maximum improvement is yet to be obtained. The anticipated improvement is attainable and reasonable in a generally predictable time.  Patient reports prior to the foot hurting he has been getting more steady and able to walk more.     Goals will be assessed next session due to patient's pain limiting weightbearing interventions. Patient agreeable to call physician after session due to noted open crack at medial aspect of heel and warmth of region.  Patient's condition has the potential to improve in response to therapy. Maximum improvement is yet to be obtained. The anticipated improvement is attainable and reasonable in a generally predictable time.  Patient's condition has the potential to improve in response to therapy. Maximum improvement is yet to be obtained. The anticipated improvement is attainable and reasonable in a generally predictable time.                  PT Education - 12/13/18 1453    Education Details  exercise technique, nonweightbearing interventions, call physician about foot    Person(s) Educated  Patient    Methods  Explanation;Demonstration;Tactile cues;Verbal cues    Comprehension  Other (comment);Returned demonstration;Verbal  cues required;Tactile cues required       PT Short Term Goals - 11/22/18 1651      PT SHORT TERM GOAL #1   Title  Patient will be independent in home exercise program to improve strength/mobility for better functional independence with ADLs.    Baseline  HEP given, 12/11/17: Performing daily:     Time  2    Period  Weeks    Status  Achieved      PT SHORT TERM GOAL #2   Title  Patient will don/doff prosthesis independently to allow for increased mobility in home.     Baseline  8/14: requires PT to guide/direct through process 8/28: independent    Time  2    Period  Weeks  Status  Achieved      PT SHORT TERM GOAL #3   Title  Patient will ambulate 10 meters with least assistive device and prosthesis to improve mobility in home.     Baseline  8/14: not walking outside of // bars yet  8/28: 63 seconds with RW and CGA     Time  2    Period  Weeks    Status  Achieved      PT SHORT TERM GOAL #4   Title  Patient will perform STS with single UE support to decrease reliance upon UE's for stability     Baseline  patient requires BUE support 11/5: 1 UR support    Time  2    Period  Weeks    Status  Achieved        PT Long Term Goals - 11/22/18 1359      PT LONG TERM GOAL #1   Title  Patient will ambulate >1057f during 612m with LRAD to demonstrate improved community ambulation and functional mobility    Baseline  11/5: will complete next session; 01/22/18: 400' in 3 minutes but had to stop secondary to pain in knee; 01/22/18: 400' in 3 minutes limited by R knee pain, 02/26/18: 500' in 3 minutes limited by low back pain; 06/07/18: Deferred due to new prosthetic; 07/30/18: 800' with SPC (500' after 3 min)- 2 rest breaks, and ended 30 seconds early due to knee and back pain 8/20 : 600 ft in 3 minutes and 20 seconds; terminated after this due to back pain 10/22: 635 ft with SPC, terminated early due to back pain 11/12: able to ambulate 4 minutes however terminated due to back pain and fatigue     Time  12    Period  Weeks    Status  Partially Met    Target Date  02/14/19      PT LONG TERM GOAL #2   Title  Patient (> 6075ears old) will complete five times sit to stand test in < 15 seconds indicating an increased LE strength and improved balance.    Baseline  8/14: 61 seconds 8/28; 42 seconds 9/17: 24 seconds BUE support with LLE 10/10: 17 seconds with excessive BUE support  11/5: 18seconds BUE support; 12/11/17: 12/11/17: 18.5s with BUE support but no AD, 01/22/18: 17.1s BUE support but no AD; 02/26/18: 13.0s with BUE, still unable to perform without UE assistance; 06/07/18: 25.4s with single UE support during first rep only; 07/30/18: 24.3s with single UE support during 1st rep only 8/20: 10 seconds BUE support, 16 seconds SUE support 10/22: 15 seconds: 3 with no UE support, 2 with UE support due to one Posterior LOB. 11/12: 13 seconds SUE support    Time  12    Period  Weeks    Status  Achieved      PT LONG TERM GOAL #3   Title  Patient will reduce timed up and go to <11 seconds to reduce fall risk and demonstrate improved transfer/gait ability.    Baseline  8/14: unable to perform 8/28: 1 min 30 seconds 9/17: 38 seconds RW 10/10: 25 seconds with RW 11/5: 20 seconds with cane;, 12/11/17: 17.4s with spc; 01/22/18: 14.8s with spc; 02/26/18: 13.0s with spc; 06/07/18: 16.1s with RW; 07/30/18: 12.44s with SPC 8/20: 11 seconds with SPC    Time  12    Period  Weeks    Status  Achieved      PT LONG TERM GOAL #4  Title  Patient will increase lower extremity functional scale to >60/80 to demonstrate improved functional mobility and increased tolerance with ADLs.     Baseline  814: 29/80 8/28: 20/80 10/10: 20/80 11/5: 22/80; 12/11/17: 18/80; 01/22/18: 27/80; 06/07/18: 30/80; 07/30/18: 35/80 8/20: 36/80 10/22: 36/80 11/12: 38/80    Time  12    Period  Weeks    Status  Partially Met    Target Date  02/14/19      PT LONG TERM GOAL #5   Title  Patient will perform 10 MWT in >1.0 m/s for improved mobility  and safety with negotiating natural environment    Baseline  9/17: .53 m/s 10/10: .63 m/s with RW 11/5: 0.29ms; 12/11/17: 15.33s = 0.65 m/s with spc; 01/22/18: self-selected: 14.4s = 0.69 m/s, fastest: 11.8s = 0.85 m/s; 02/26/18: self-selected: 13.3s = 0.75 m/s, fastest: 10.6s = 0.94 m/s; 06/07/18: self-selected: 14.0s = 0.71 m/s, fastest: 11.7s = 0.85 m/s; 07/30/18: self selected: 12.45s = 0.843m, fastest:8.84s = 1.1345m8/20: 1.1 m/s with SPC    Time  12    Period  Weeks    Status  Achieved      PT LONG TERM GOAL #6   Title  Patient will be mod I for negotiating curb with LRAD while wearing prosthetic for safe community ambulation.    Baseline  8/20: patient not able to negotiate curbs 10/22: able to negotiate curl with SPC per patient report    Time  12    Period  Weeks    Status  Achieved      PT LONG TERM GOAL #7   Title  Patient (> 60 45ars old) will complete five times sit to stand test without UE support  in < 15 seconds indicating an increased LE strength and improved balance.    Baseline  11/12: 13 seconds with SUE support unable to perform 1 STS without UE support    Time  12    Period  Weeks    Status  New    Target Date  02/14/19      PT LONG TERM GOAL #8   Title  Patient will tolerate SLS on prosthetic limb for 10 seconds for progression of stability with functional mobility    Baseline  11/12: 4 seconds    Time  12    Period  Weeks    Status  New    Target Date  02/14/19            Plan - 12/13/18 1517    Clinical Impression Statement  Goals will be assessed next session due to patient's pain limiting weightbearing interventions. Patient agreeable to call physician after session due to noted open crack at medial aspect of heel and warmth of region.  Patient's condition has the potential to improve in response to therapy. Maximum improvement is yet to be obtained. The anticipated improvement is attainable and reasonable in a generally predictable time.  Patient's  condition has the potential to improve in response to therapy. Maximum improvement is yet to be obtained. The anticipated improvement is attainable and reasonable in a generally predictable time.    Examination-Participation Restrictions  Interpersonal Relationship    Rehab Potential  Fair    Clinical Impairments Affecting Rehab Potential  (+) previous independence, motivation to return to walking (-) lives alone, hx of diabetes, limited vision     PT Frequency  2x / week    PT Duration  8 weeks    PT Treatment/Interventions  ADLs/Self  Care Home Management;Cryotherapy;Electrical Stimulation;Ultrasound;Traction;Moist Heat;DME Instruction;Gait training;Stair training;Functional mobility training;Therapeutic activities;Therapeutic exercise;Patient/family education;Neuromuscular re-education;Balance training;Prosthetic Training;Wheelchair mobility training;Manual techniques;Manual lymph drainage;Compression bandaging;Taping;Energy conservation;Passive range of motion    PT Next Visit Plan  leg press progression of SLS on prosthetic limb,    PT Home Exercise Plan  see [id number] on medbridge, spc ambulation    Consulted and Agree with Plan of Care  Patient       Patient will benefit from skilled therapeutic intervention in order to improve the following deficits and impairments:  Abnormal gait, Decreased activity tolerance, Decreased balance, Decreased knowledge of precautions, Decreased endurance, Decreased knowledge of use of DME, Decreased mobility, Decreased range of motion, Difficulty walking, Decreased strength, Increased edema, Impaired flexibility, Impaired perceived functional ability, Prosthetic Dependency, Postural dysfunction, Improper body mechanics, Pain  Visit Diagnosis: Unsteadiness on feet  Other abnormalities of gait and mobility  Muscle weakness (generalized)  Unilateral complete BKA, right, sequela ([place])     Problem List Patient Active Problem List   Diagnosis Date Noted  .  Pain due to onychomycosis of toenail of right foot 07/05/2018  . Abnormality of gait 10/12/2017  . Poorly controlled type 2 diabetes mellitus with peripheral neuropathy ([place])   . Flatulence   . Hypomagnesemia   . Unilateral complete BKA, right, sequela ([place])   . Benign essential HTN   . Hypoalbuminemia due to protein-calorie malnutrition ([place])   . S/P below knee amputation, right ([place]) 04/12/2017  . Acute blood loss anemia   . Other encephalopathy 04/08/2017  . Encephalopathy 04/08/2017  . Altered mental status 04/08/2017  . Labile blood pressure   . Labile blood glucose   . Drug induced constipation   . Stage 3 chronic kidney disease   . Bacteremia   . S/P unilateral BKA (below knee amputation), right ([place]) 04/04/2017  . PAD (peripheral artery disease) ([place])   . Type 2 diabetes mellitus with right diabetic foot ulcer ([place])   . Post-operative pain   . Legally blind   . Upper GI bleed   . Streptococcal bacteremia 04/01/2017  . Hypokalemia 03/30/2017  . Uncontrolled type 2 diabetes mellitus with hyperglycemia, with long-term current use of insulin ([employer]) 03/30/2017  . Type 2 diabetes mellitus with peripheral neuropathy ([place]) 03/30/2017  . AKI (acute kidney injury) ([place]) 03/30/2017  . CKD stage 3 due to type 2 diabetes mellitus ([place]) 03/30/2017  . Sepsis ([place]) 03/30/2017  . Heart murmur 11/22/2016  . Hyperlipidemia associated with type 2 diabetes mellitus ([place]) 11/22/2016  . Obstructive sleep apnea syndrome 11/22/2016  . Essential hypertension 12/17/2012   Janna Arch, PT, DPT   12/13/2018, 3:18 PM  [place] MAIN [facility] SERVICES [address] [place], [place], [address] Phone: [phone]   Fax:  [phone]  Name: Trevor [name] MRN: [id number] Date of Birth: 1955/03/02

## 2018-12-17 ENCOUNTER — Inpatient Hospital Stay
Admission: EM | Admit: 2018-12-17 | Discharge: 2018-12-19 | DRG: 638 | Disposition: A | Discharge status: Home Health | Payer: Medicare Other | Source: Ambulatory Visit | Attending: Internal Medicine | Admitting: Internal Medicine

## 2018-12-17 ENCOUNTER — Emergency Department: Payer: Medicare Other

## 2018-12-17 ENCOUNTER — Encounter: Payer: Self-pay | Admitting: Emergency Medicine

## 2018-12-17 DIAGNOSIS — B353 Tinea pedis: Secondary | ICD-10-CM | POA: Diagnosis present

## 2018-12-17 DIAGNOSIS — L97521 Non-pressure chronic ulcer of other part of left foot limited to breakdown of skin: Secondary | ICD-10-CM | POA: Diagnosis present

## 2018-12-17 DIAGNOSIS — Z888 Allergy status to other drugs, medicaments and biological substances status: Secondary | ICD-10-CM

## 2018-12-17 DIAGNOSIS — Z8249 Family history of ischemic heart disease and other diseases of the circulatory system: Secondary | ICD-10-CM

## 2018-12-17 DIAGNOSIS — R7989 Other specified abnormal findings of blood chemistry: Secondary | ICD-10-CM | POA: Diagnosis not present

## 2018-12-17 DIAGNOSIS — Z947 Corneal transplant status: Secondary | ICD-10-CM | POA: Diagnosis not present

## 2018-12-17 DIAGNOSIS — E1140 Type 2 diabetes mellitus with diabetic neuropathy, unspecified: Secondary | ICD-10-CM | POA: Diagnosis present

## 2018-12-17 DIAGNOSIS — L0390 Cellulitis, unspecified: Secondary | ICD-10-CM

## 2018-12-17 DIAGNOSIS — Z79899 Other long term (current) drug therapy: Secondary | ICD-10-CM

## 2018-12-17 DIAGNOSIS — I10 Essential (primary) hypertension: Secondary | ICD-10-CM | POA: Diagnosis present

## 2018-12-17 DIAGNOSIS — E872 Acidosis: Secondary | ICD-10-CM | POA: Diagnosis present

## 2018-12-17 DIAGNOSIS — Z7982 Long term (current) use of aspirin: Secondary | ICD-10-CM | POA: Diagnosis not present

## 2018-12-17 DIAGNOSIS — E1110 Type 2 diabetes mellitus with ketoacidosis without coma: Secondary | ICD-10-CM | POA: Diagnosis not present

## 2018-12-17 DIAGNOSIS — E11621 Type 2 diabetes mellitus with foot ulcer: Secondary | ICD-10-CM | POA: Diagnosis present

## 2018-12-17 DIAGNOSIS — E11628 Type 2 diabetes mellitus with other skin complications: Secondary | ICD-10-CM | POA: Diagnosis present

## 2018-12-17 DIAGNOSIS — E1165 Type 2 diabetes mellitus with hyperglycemia: Secondary | ICD-10-CM | POA: Diagnosis present

## 2018-12-17 DIAGNOSIS — L84 Corns and callosities: Secondary | ICD-10-CM | POA: Diagnosis present

## 2018-12-17 DIAGNOSIS — Z89511 Acquired absence of right leg below knee: Secondary | ICD-10-CM | POA: Diagnosis not present

## 2018-12-17 DIAGNOSIS — Z20828 Contact with and (suspected) exposure to other viral communicable diseases: Secondary | ICD-10-CM | POA: Diagnosis present

## 2018-12-17 DIAGNOSIS — Z794 Long term (current) use of insulin: Secondary | ICD-10-CM | POA: Diagnosis not present

## 2018-12-17 DIAGNOSIS — G4733 Obstructive sleep apnea (adult) (pediatric): Secondary | ICD-10-CM | POA: Diagnosis present

## 2018-12-17 DIAGNOSIS — E785 Hyperlipidemia, unspecified: Secondary | ICD-10-CM | POA: Diagnosis present

## 2018-12-17 DIAGNOSIS — Z833 Family history of diabetes mellitus: Secondary | ICD-10-CM | POA: Diagnosis not present

## 2018-12-17 DIAGNOSIS — L03116 Cellulitis of left lower limb: Secondary | ICD-10-CM | POA: Diagnosis present

## 2018-12-17 LAB — CBC WITH DIFFERENTIAL/PLATELET
Abs Immature Granulocytes: 0.03 10*3/uL (ref 0.00–0.07)
Basophils Absolute: 0 10*3/uL (ref 0.0–0.1)
Basophils Relative: 0 %
Eosinophils Absolute: 0.2 10*3/uL (ref 0.0–0.5)
Eosinophils Relative: 2 %
HCT: 46.4 % (ref 39.0–52.0)
Hemoglobin: 15.5 g/dL (ref 13.0–17.0)
Immature Granulocytes: 0 %
Lymphocytes Relative: 14 %
Lymphs Abs: 1.3 10*3/uL (ref 0.7–4.0)
MCH: 27 pg (ref 26.0–34.0)
MCHC: 33.4 g/dL (ref 30.0–36.0)
MCV: 80.7 fL (ref 80.0–100.0)
Monocytes Absolute: 0.8 10*3/uL (ref 0.1–1.0)
Monocytes Relative: 8 %
Neutro Abs: 6.8 10*3/uL (ref 1.7–7.7)
Neutrophils Relative %: 76 %
Platelets: 231 10*3/uL (ref 150–400)
RBC: 5.75 MIL/uL (ref 4.22–5.81)
RDW: 13.9 % (ref 11.5–15.5)
WBC: 9 10*3/uL (ref 4.0–10.5)
nRBC: 0 % (ref 0.0–0.2)

## 2018-12-17 LAB — COMPREHENSIVE METABOLIC PANEL
ALT: 18 U/L (ref 0–44)
AST: 22 U/L (ref 15–41)
Albumin: 4.3 g/dL (ref 3.5–5.0)
Alkaline Phosphatase: 85 U/L (ref 38–126)
Anion gap: 11 (ref 5–15)
BUN: 27 mg/dL — ABNORMAL HIGH (ref 8–23)
CO2: 25 mmol/L (ref 22–32)
Calcium: 9.2 mg/dL (ref 8.9–10.3)
Chloride: 101 mmol/L (ref 98–111)
Creatinine, Ser: 1.06 mg/dL (ref 0.61–1.24)
GFR calc Af Amer: >60 mL/min (ref >60)
GFR calc non Af Amer: >60 mL/min (ref >60)
Glucose, Bld: 303 mg/dL — ABNORMAL HIGH (ref 70–99)
Potassium: 3.5 mmol/L (ref 3.5–5.1)
Sodium: 137 mmol/L (ref 135–145)
Total Bilirubin: 0.5 mg/dL (ref 0.3–1.2)
Total Protein: 7.6 g/dL (ref 6.5–8.1)

## 2018-12-17 LAB — LACTIC ACID, PLASMA: Lactic Acid, Venous: 3.4 mmol/L (ref 0.5–1.9)

## 2018-12-17 MED ORDER — VANCOMYCIN HCL IN DEXTROSE 1-5 GM/200ML-% IV SOLN
1000.0000 mg | Freq: Once | INTRAVENOUS | Status: AC
  Administered 2018-12-17: 1000 mg via INTRAVENOUS
  Filled 2018-12-17: qty 200

## 2018-12-17 MED ORDER — SODIUM CHLORIDE 0.9 % IV SOLN
3.0000 g | Freq: Once | INTRAVENOUS | Status: AC
  Administered 2018-12-17: 3 g via INTRAVENOUS
  Filled 2018-12-17: qty 8

## 2018-12-17 MED ORDER — SODIUM CHLORIDE 0.9 % IV BOLUS
1000.0000 mL | Freq: Once | INTRAVENOUS | Status: AC
  Administered 2018-12-17: 1000 mL via INTRAVENOUS

## 2018-12-17 NOTE — ED Provider Notes (Signed)
[facility] Emergency Department Provider Note ____________________________________________   First MD Initiated Contact with Patient 12/17/18 2129     (approximate)  I have reviewed the triage vital signs and the nursing notes.   HISTORY  Chief Complaint Wound Infection and Hyperglycemia    HPI Trevor [name] is a 63 y.o. male with PMH as noted below including diabetes who presents with pain to the left foot, gradual onset over several days, worst at the heel, and associated with cracking of the skin and some bleeding.  The patient went to an urgent care and was sent to the ED for further evaluation.  He denies pain going further up the leg, has not had fever or chills.  He denies any trauma to the leg.  He states that he has had skin cracking there for some time.  Past Medical History:  Diagnosis Date   Diabetes mellitus without complication ([place])    Heart murmur    Hyperlipidemia    Hypertension    Sleep apnea     Patient Active Problem List   Diagnosis Date Noted   Pain due to onychomycosis of toenail of right foot 07/05/2018   Abnormality of gait 10/12/2017   Poorly controlled type 2 diabetes mellitus with peripheral neuropathy (HCC)    Flatulence    Hypomagnesemia    Unilateral complete BKA, right, sequela (HCC)    Benign essential HTN    Hypoalbuminemia due to protein-calorie malnutrition ([place])    S/P below knee amputation, right ([place]) 04/12/2017   Acute blood loss anemia    Other encephalopathy 04/08/2017   Encephalopathy 04/08/2017   Altered mental status 04/08/2017   Labile blood pressure    Labile blood glucose    Drug induced constipation    Stage 3 chronic kidney disease    Bacteremia    S/P unilateral BKA (below knee amputation), right ([place]) 04/04/2017   PAD (peripheral artery disease) ([place])    Type 2 diabetes mellitus with right diabetic foot ulcer ([place])    Post-operative pain    Legally blind      Upper GI bleed    Streptococcal bacteremia 04/01/2017   Hypokalemia 03/30/2017   Uncontrolled type 2 diabetes mellitus with hyperglycemia, with long-term current use of insulin ([place]) 03/30/2017   Type 2 diabetes mellitus with peripheral neuropathy ([place]) 03/30/2017   AKI (acute kidney injury) ([place]) 03/30/2017   CKD stage 3 due to type 2 diabetes mellitus ([place]) 03/30/2017   Sepsis ([place]) 03/30/2017   Heart murmur 11/22/2016   Hyperlipidemia associated with type 2 diabetes mellitus ([place]) 11/22/2016   Obstructive sleep apnea syndrome 11/22/2016   Essential hypertension 12/17/2012    Past Surgical History:  Procedure Laterality Date   AMPUTATION Right 03/31/2017   Procedure: RIGHT FOOT 1ST AND 2ND RAY AMPUTATION;  Surgeon: Newt Minion, MD;  Location: [place];  Service: Orthopedics;  Laterality: Right;   AMPUTATION Right 04/01/2017   Procedure: AMPUTATION BELOW KNEE;  Surgeon: Newt Minion, MD;  Location: [place];  Service: Orthopedics;  Laterality: Right;   COLONOSCOPY WITH PROPOFOL N/A 10/02/2017   Procedure: COLONOSCOPY WITH PROPOFOL;  Surgeon: [name], MD;  [facility] ENDOSCOPY;  Service: Gastroenterology;  Laterality: N/A;   CORNEAL TRANSPLANT      Prior to Admission medications   Medication Sig Start Date End Date Taking? Authorizing Provider  ACCU-CHEK AVIVA PLUS test strip USE AS DIRECTED TO CHECK BLOOD SUGAR THREE TIMES A DAY 06/11/18  Yes [provider]  acetaminophen (TYLENOL) 325 MG tablet Take 2 tablets (650 mg total) by mouth every 6 (six) hours as needed for mild pain (or Fever >/= 101). 04/12/17  Yes Katherine Roan, MD  aspirin EC 81 MG tablet Take 81 mg by mouth daily.   Yes [provider]  atenolol (TENORMIN) 100 MG tablet Take 100 mg by mouth daily.    Yes [provider]  atorvastatin (LIPITOR) 40 MG tablet Take 40 mg by mouth daily. 11/10/18  Yes [provider]  BD INSULIN SYRINGE U/F 31G X 5/16" 1 ML MISC   07/23/18  Yes [provider]  glipiZIDE (GLUCOTROL) 10 MG tablet Take 10 mg by mouth 2 (two) times daily before a meal.   Yes [provider]  insulin aspart (NOVOLOG) 100 UNIT/ML FlexPen Inject 10 Units into the skin 3 (three) times daily with meals. 04/27/17  Yes Angiulli, Lavon Paganini, PA-C  insulin detemir (LEVEMIR) 100 unit/ml SOLN Inject 0.34 mLs (34 Units total) into the skin at bedtime. 04/27/17  Yes Angiulli, Lavon Paganini, PA-C  losartan (COZAAR) 100 MG tablet Take 100 mg by mouth daily.   Yes [provider]  metFORMIN (GLUCOPHAGE) 1000 MG tablet Take 1,000 mg by mouth 2 (two) times daily with a meal.    Yes [provider]  NIFEdipine (PROCARDIA-XL/NIFEDICAL-XL) 30 MG 24 hr tablet Take 120 mg by mouth daily.   Yes [provider]  polyethylene glycol (MIRALAX / GLYCOLAX) packet Take 17 g by mouth daily as needed for mild constipation. 04/04/17  Yes Colbert Ewing, MD  spironolactone (ALDACTONE) 25 MG tablet Take 25 mg by mouth daily.   Yes [provider]  ammonium lactate (LAC-HYDRIN) 12 % cream Apply topically as needed for dry skin. Patient not taking: Reported on 12/17/2018 02/05/18   Gardiner Barefoot, DPM  potassium chloride SA (KLOR-CON M20) 20 MEQ tablet Take 1 tablet (20 mEq total) by mouth daily. Patient not taking: Reported on 12/17/2018 10/25/18   Hinda Kehr, MD  traMADol (ULTRAM) 50 MG tablet Take 0.5-1 tablets (25-50 mg total) by mouth every 8 (eight) hours as needed for severe pain. Patient not taking: Reported on 11/09/2018 04/27/17   Angiulli, Lavon Paganini, PA-C    Allergies Enalapril  History reviewed. No pertinent family history.  Social History Social History   Tobacco Use   Smoking status: Never Smoker   Smokeless tobacco: Never Used  Substance Use Topics   Alcohol use: No   Drug use: Never    Review of Systems  Constitutional: No fever/chills. Eyes: No redness. ENT: No sore throat. Cardiovascular: Denies  chest pain. Respiratory: Denies shortness of breath. Gastrointestinal: No vomiting or diarrhea.  Genitourinary: Negative for dysuria.  Musculoskeletal: Negative for back pain.  Positive for left foot pain. Skin: Negative for rash. Neurological: Negative for focal weakness or numbness.   ____________________________________________   PHYSICAL EXAM:  VITAL SIGNS: ED Triage Vitals [12/17/18 1936]  Enc Vitals Group     BP (!) 163/82     Pulse Rate 76     Resp 18     Temp 99.2 F (37.3 C)     Temp Source Oral     SpO2 100 %     Weight      Height      Head Circumference      Peak Flow      Pain Score      Pain Loc      Pain Edu?      Excl.  in [place]?     Constitutional: Alert and oriented. Well appearing and in no acute distress. Eyes: Conjunctivae are normal.  Head: Atraumatic. Nose: No congestion/rhinnorhea. Mouth/Throat: Mucous membranes are moist.   Neck: Normal range of motion.  Cardiovascular: Normal rate, regular rhythm.  Good peripheral circulation. Respiratory: Normal respiratory effort.  No retractions.  Gastrointestinal: No distention.  Musculoskeletal: Extremities warm and well perfused.  Left foot with dried and cracked skin.  No significant open wounds.  No active bleeding.  Erythema and induration with slight warmth to the foot especially around the heel.  Full range of motion of the ankle.  No erythema or streaking up the leg. Neurologic:  Normal speech and language. No gross focal neurologic deficits are appreciated.  Skin:  Skin is warm and dry. No rash noted. Psychiatric: Mood and affect are normal. Speech and behavior are normal.  ____________________________________________   LABS (all labs ordered are listed, but only abnormal results are displayed)  Labs Reviewed  LACTIC ACID, PLASMA - Abnormal; Notable for the following components:      Result Value   Lactic Acid, Venous 3.4 (*)    All other components within normal limits  COMPREHENSIVE  METABOLIC PANEL - Abnormal; Notable for the following components:   Glucose, Bld 303 (*)    BUN 27 (*)    All other components within normal limits  CULTURE, BLOOD (ROUTINE X 2)  CULTURE, BLOOD (ROUTINE X 2)  SARS CORONAVIRUS 2 (TAT 6-24 HRS)  CBC WITH DIFFERENTIAL/PLATELET   ____________________________________________  EKG   ____________________________________________  RADIOLOGY  XR L foot: No x-ray evidence of osteomyelitis.  Findings compatible with soft tissue infection with some subcutaneous gas. ____________________________________________   PROCEDURES  Procedure(s) performed: No  Procedures  Critical Care performed: No ____________________________________________   INITIAL IMPRESSION / ASSESSMENT AND PLAN / ED COURSE  Pertinent labs & imaging results that were available during my care of the patient were reviewed by me and considered in my medical decision making (see chart for details).  63 year old male with PMH as noted above including history of diabetes who presents with pain, redness, and swelling to the left foot especially around the heel, in the context of dry cracked skin that has been present there for some time.  The patient denies any fever or chills.  On exam, the patient is relatively comfortable appearing.  He has a borderline elevated temperature and is hypertensive with otherwise normal vital signs.  He has cracked and dry skin with some deeper cracks especially near the heel although there is no large open wound and no bleeding at this time.  The foot does appear swollen and erythematous, consistent with cellulitis.  Initial lab work-up reveals an elevated lactate.  Overall presentation is most consistent with cellulitis, however given the patient's diabetic history and elevated lactate I will obtain an x-ray to evaluate for findings of possible osteomyelitis.  ----------------------------------------- 11:32 PM on  12/17/2018 -----------------------------------------  X-ray shows no evidence of osteomyelitis, but there are findings consistent with soft tissue infection including some subcutaneous air.  Given these findings and the elevated lactate as well as the patient's diabetes history I will admit him for IV antibiotics.  The patient reports that he had a bad reaction to antibiotic when he was admitted a few years ago for his amputation.  I reviewed the discharge summary from this admission, and the patient had an episode of altered mental status which was thought to be a reaction to cephalosporins.  He was treated  with Unasyn.  I have ordered vancomycin and Unasyn for broad-spectrum coverage given his diabetes.  I discussed the case with Dr. Sidney Ace for admission to the hospitalist service.  ___________________________  Trevor [name] was evaluated in Emergency Department on 12/17/2018 for the symptoms described in the history of present illness. He was evaluated in the context of the global COVID-19 pandemic, which necessitated consideration that the patient might be at risk for infection with the SARS-CoV-2 virus that causes COVID-19. Institutional protocols and algorithms that pertain to the evaluation of patients at risk for COVID-19 are in a state of rapid change based on information released by regulatory bodies including the CDC and federal and state organizations. These policies and algorithms were followed during the patient's care in the ED.  ____________________________________________   FINAL CLINICAL IMPRESSION(S) / ED DIAGNOSES  Final diagnoses:  Cellulitis of left foot      NEW MEDICATIONS STARTED DURING THIS VISIT:  New Prescriptions   No medications on file     Note:  This document was prepared using Dragon voice recognition software and may include unintentional dictation errors.    Arta Silence, MD 12/17/18 [phone]

## 2018-12-17 NOTE — ED Triage Notes (Signed)
Pt reports open wound to the left heel with bleeding. Area is cracked open with dry blood noted. Pt is diabetic and missed his insulin dose today. [facility] and sent to ED due to wound, elevated blood sugar as well as 2+ pitting edema to the left lower leg. Pt denies fever.

## 2018-12-17 NOTE — ED Notes (Signed)
MD at bedside. 

## 2018-12-17 NOTE — ED Notes (Signed)
Pt transported to xray 

## 2018-12-17 NOTE — ED Notes (Signed)
Charge nurse notified of pt's lactic results

## 2018-12-18 ENCOUNTER — Inpatient Hospital Stay: Payer: Medicare Other

## 2018-12-18 ENCOUNTER — Ambulatory Visit: Payer: Medicare Other

## 2018-12-18 DIAGNOSIS — L03116 Cellulitis of left lower limb: Secondary | ICD-10-CM

## 2018-12-18 LAB — GLUCOSE, CAPILLARY
Glucose-Capillary: 188 mg/dL — ABNORMAL HIGH (ref 70–99)
Glucose-Capillary: 222 mg/dL — ABNORMAL HIGH (ref 70–99)
Glucose-Capillary: 264 mg/dL — ABNORMAL HIGH (ref 70–99)
Glucose-Capillary: 198 mg/dL — ABNORMAL HIGH (ref 70–99)

## 2018-12-18 LAB — HEMOGLOBIN A1C
Hgb A1c MFr Bld: 9.6 % — ABNORMAL HIGH (ref 4.8–5.6)
Mean Plasma Glucose: 228.82 mg/dL

## 2018-12-18 LAB — HIV ANTIBODY (ROUTINE TESTING W REFLEX): HIV Screen 4th Generation wRfx: NONREACTIVE

## 2018-12-18 LAB — LACTIC ACID, PLASMA
Lactic Acid, Venous: 2.6 mmol/L (ref 0.5–1.9)
Lactic Acid, Venous: 2.7 mmol/L (ref 0.5–1.9)

## 2018-12-18 LAB — SARS CORONAVIRUS 2 (TAT 6-24 HRS): SARS Coronavirus 2: NEGATIVE

## 2018-12-18 MED ORDER — ACETAMINOPHEN 650 MG RE SUPP: 650.0000 mg | Freq: Four times a day (QID) | RECTAL | Status: DC | PRN

## 2018-12-18 MED ORDER — INSULIN ASPART 100 UNIT/ML ~~LOC~~ SOLN
0.0000 [IU] | Freq: Three times a day (TID) | SUBCUTANEOUS | Status: DC
  Administered 2018-12-18: 5 [IU] via SUBCUTANEOUS
  Administered 2018-12-18: 8 [IU] via SUBCUTANEOUS
  Administered 2018-12-19: 5 [IU] via SUBCUTANEOUS
  Administered 2018-12-19: 4 [IU] via SUBCUTANEOUS
  Filled 2018-12-18 (×4): qty 1

## 2018-12-18 MED ORDER — SODIUM CHLORIDE 0.9 % IV SOLN
2.0000 g | INTRAVENOUS | Status: DC
  Administered 2018-12-18 – 2018-12-19 (×2): 2 g via INTRAVENOUS
  Filled 2018-12-18 (×2): qty 2

## 2018-12-18 MED ORDER — SALINE SPRAY 0.65 % NA SOLN
1.0000 | NASAL | Status: DC | PRN
  Administered 2018-12-18: 1 via NASAL
  Filled 2018-12-18: qty 44

## 2018-12-18 MED ORDER — TRAZODONE HCL 50 MG PO TABS: 25.0000 mg | ORAL_TABLET | Freq: Every evening | ORAL | Status: DC | PRN

## 2018-12-18 MED ORDER — VANCOMYCIN HCL 1.25 G IV SOLR
1250.0000 mg | Freq: Two times a day (BID) | INTRAVENOUS | Status: DC
  Administered 2018-12-18 – 2018-12-19 (×3): 1250 mg via INTRAVENOUS
  Filled 2018-12-18 (×4): qty 1250

## 2018-12-18 MED ORDER — ENOXAPARIN SODIUM 40 MG/0.4ML ~~LOC~~ SOLN
40.0000 mg | SUBCUTANEOUS | Status: DC
  Administered 2018-12-18 – 2018-12-19 (×2): 40 mg via SUBCUTANEOUS
  Filled 2018-12-18 (×2): qty 0.4

## 2018-12-18 MED ORDER — CLINDAMYCIN PHOSPHATE 600 MG/50ML IV SOLN
600.0000 mg | Freq: Three times a day (TID) | INTRAVENOUS | Status: DC
  Filled 2018-12-18 (×2): qty 50

## 2018-12-18 MED ORDER — NIFEDIPINE ER 60 MG PO TB24
120.0000 mg | ORAL_TABLET | Freq: Every day | ORAL | Status: DC
  Administered 2018-12-18 – 2018-12-19 (×2): 120 mg via ORAL
  Filled 2018-12-18 (×2): qty 2

## 2018-12-18 MED ORDER — FLUTICASONE PROPIONATE 50 MCG/ACT NA SUSP
1.0000 | Freq: Every day | NASAL | Status: DC
  Administered 2018-12-18 – 2018-12-19 (×2): 1 via NASAL
  Filled 2018-12-18: qty 16

## 2018-12-18 MED ORDER — SODIUM CHLORIDE 0.9 % IV SOLN
INTRAVENOUS | Status: DC
  Administered 2018-12-18: 02:00:00 via INTRAVENOUS

## 2018-12-18 MED ORDER — ACETAMINOPHEN 325 MG PO TABS: 650.0000 mg | ORAL_TABLET | Freq: Four times a day (QID) | ORAL | Status: DC | PRN

## 2018-12-18 MED ORDER — GLIPIZIDE 10 MG PO TABS
10.0000 mg | ORAL_TABLET | Freq: Two times a day (BID) | ORAL | Status: DC
  Administered 2018-12-18: 10 mg via ORAL
  Filled 2018-12-18: qty 1

## 2018-12-18 MED ORDER — METRONIDAZOLE IN NACL 5-0.79 MG/ML-% IV SOLN
500.0000 mg | Freq: Three times a day (TID) | INTRAVENOUS | Status: DC
  Administered 2018-12-18 – 2018-12-19 (×3): 500 mg via INTRAVENOUS
  Filled 2018-12-18 (×6): qty 100

## 2018-12-18 MED ORDER — MAGNESIUM HYDROXIDE 400 MG/5ML PO SUSP: 30.0000 mL | Freq: Every day | ORAL | Status: DC | PRN

## 2018-12-18 MED ORDER — ONDANSETRON HCL 4 MG PO TABS: 4.0000 mg | ORAL_TABLET | Freq: Four times a day (QID) | ORAL | Status: DC | PRN

## 2018-12-18 MED ORDER — INSULIN ASPART 100 UNIT/ML ~~LOC~~ SOLN: 0.0000 [IU] | Freq: Every day | SUBCUTANEOUS | Status: DC

## 2018-12-18 MED ORDER — INSULIN DETEMIR 100 UNIT/ML ~~LOC~~ SOLN
34.0000 [IU] | Freq: Every day | SUBCUTANEOUS | Status: DC
  Filled 2018-12-18 (×2): qty 0.34

## 2018-12-18 MED ORDER — LOSARTAN POTASSIUM 50 MG PO TABS
100.0000 mg | ORAL_TABLET | Freq: Every day | ORAL | Status: DC
  Administered 2018-12-18 – 2018-12-19 (×2): 100 mg via ORAL
  Filled 2018-12-18 (×2): qty 2

## 2018-12-18 MED ORDER — SPIRONOLACTONE 25 MG PO TABS
25.0000 mg | ORAL_TABLET | Freq: Every day | ORAL | Status: DC
  Administered 2018-12-18 – 2018-12-19 (×2): 25 mg via ORAL
  Filled 2018-12-18 (×2): qty 1

## 2018-12-18 MED ORDER — ONDANSETRON HCL 4 MG/2ML IJ SOLN: 4.0000 mg | Freq: Four times a day (QID) | INTRAMUSCULAR | Status: DC | PRN

## 2018-12-18 MED ORDER — POLYETHYLENE GLYCOL 3350 17 G PO PACK: 17.0000 g | PACK | Freq: Every day | ORAL | Status: DC | PRN

## 2018-12-18 MED ORDER — KETOROLAC TROMETHAMINE 15 MG/ML IJ SOLN: 15.0000 mg | Freq: Four times a day (QID) | INTRAMUSCULAR | Status: DC | PRN

## 2018-12-18 MED ORDER — INSULIN ASPART 100 UNIT/ML ~~LOC~~ SOLN
4.0000 [IU] | Freq: Three times a day (TID) | SUBCUTANEOUS | Status: DC
  Administered 2018-12-18 – 2018-12-19 (×4): 4 [IU] via SUBCUTANEOUS
  Filled 2018-12-18 (×4): qty 1

## 2018-12-18 MED ORDER — ATENOLOL 25 MG PO TABS
100.0000 mg | ORAL_TABLET | Freq: Every day | ORAL | Status: DC
  Administered 2018-12-18 – 2018-12-19 (×2): 100 mg via ORAL
  Filled 2018-12-18 (×2): qty 4

## 2018-12-18 MED ORDER — ATORVASTATIN CALCIUM 20 MG PO TABS
40.0000 mg | ORAL_TABLET | Freq: Every day | ORAL | Status: DC
  Administered 2018-12-18: 40 mg via ORAL
  Filled 2018-12-18: qty 2

## 2018-12-18 MED ORDER — SODIUM CHLORIDE 0.9 % IV BOLUS
500.0000 mL | Freq: Once | INTRAVENOUS | Status: AC
  Administered 2018-12-18: 500 mL via INTRAVENOUS

## 2018-12-18 MED ORDER — INSULIN DETEMIR 100 UNIT/ML ~~LOC~~ SOLN
34.0000 [IU] | Freq: Every day | SUBCUTANEOUS | Status: DC
  Administered 2018-12-18 – 2018-12-19 (×2): 34 [IU] via SUBCUTANEOUS
  Filled 2018-12-18 (×3): qty 0.34

## 2018-12-18 MED ORDER — ASPIRIN EC 81 MG PO TBEC
81.0000 mg | DELAYED_RELEASE_TABLET | Freq: Every day | ORAL | Status: DC
  Administered 2018-12-18 – 2018-12-19 (×2): 81 mg via ORAL
  Filled 2018-12-18 (×2): qty 1

## 2018-12-18 NOTE — Progress Notes (Signed)
PHARMACY - PHYSICIAN COMMUNICATION CRITICAL VALUE ALERT - BLOOD CULTURE IDENTIFICATION (BCID)  Trevor [name] is an 63 y.o. male who presented to [facility] on 12/17/2018 with a chief complaint of diabetic foot infection  Assessment: 1/4 bottles gram positive rods in aerobic bottle (possible contamination)  Name of physician (or Provider) Contacted: Dr. Loleta Books  Current antibiotics: Vancomycin, ceftriaxone, metronidazole  Changes to prescribed antibiotics recommended:  None  [place] Resident 12/18/2018  2:18 PM

## 2018-12-18 NOTE — TOC Initial Note (Signed)
Transition of Care [facility]) - Initial/Assessment Note    Patient Details  Name: Trevor [name] MRN: [id number] Date of Birth: March 12, 1955  [facility] of [facility]) CM/SW Contact:    Victorino Dike, RN Phone Number: 12/18/2018, 2:57 PM  Clinical Narrative:          Met with patient about discharge needs.  Patient stated he has cane which he uses for ambulation.  He plans to continue PCS with current agency [facility].  He is set up to for [facility] with [facility] for Nursing and PT services.          Expected Discharge Plan: [place] Barriers to Discharge: Barriers Resolved   Patient Goals and CMS Choice Patient states their goals for this hospitalization and ongoing recovery are:: Patient states his goal is to go home with home health.      Expected Discharge Plan and Services Expected Discharge Plan: [place] arrangements for the past 2 months: [place]: PT [place]: [place] ([place]) Date [place]: 12/18/18 Time [place]: 1453 Representative spoke with at [place]: Floydene Flock at [place] Arrangements/Services Living arrangements for the past 2 months: [place] with:: Self Patient language and need for interpreter reviewed:: No        Need for Family Participation in Patient Care: No (Comment) Care giver support system in place?: Yes (comment) Current home services: Other (comment)(Personal care services) Criminal Activity/Legal Involvement Pertinent to Current Situation/Hospitalization: No - Comment as needed  Activities of Daily Living      Permission Sought/Granted                  Emotional Assessment Appearance:: Appears stated age Attitude/Demeanor/Rapport: Engaged, Self-Confident Affect (typically observed): Appropriate Orientation: : Oriented to  Self, Oriented to Place, Oriented to  Time, Oriented to Situation Alcohol / Substance Use: Not Applicable Psych Involvement: No (comment)  Admission diagnosis:  Cellulitis of left foot [L03.116] Patient Active Problem List   Diagnosis Date Noted  . Cellulitis of foot, left 12/17/2018  . Pain due to onychomycosis of toenail of right foot 07/05/2018  . Abnormality of gait 10/12/2017  . Poorly controlled type 2 diabetes mellitus with peripheral neuropathy ([place])   . Flatulence   . Hypomagnesemia   . Unilateral complete BKA, right, sequela ([place])   . Benign essential HTN   . Hypoalbuminemia due to protein-calorie malnutrition ([place])   . S/P below knee amputation, right ([place]) 04/12/2017  . Acute blood loss anemia   . Other encephalopathy 04/08/2017  . Encephalopathy 04/08/2017  . Altered mental status 04/08/2017  . Labile blood pressure   . Labile blood glucose   . Drug induced constipation   . Stage 3 chronic kidney disease   . Bacteremia   . S/P unilateral BKA (below knee amputation), right ([place]) 04/04/2017  . PAD (peripheral artery disease) ([place])   . Type 2 diabetes mellitus with right diabetic foot ulcer ([place])   . Post-operative pain   . Legally blind   . Upper GI bleed   . Streptococcal bacteremia 04/01/2017  . Hypokalemia 03/30/2017  . Uncontrolled type 2 diabetes mellitus with hyperglycemia, with long-term current use of insulin ([place])  03/30/2017  . Type 2 diabetes mellitus with peripheral neuropathy ([place]) 03/30/2017  . AKI (acute kidney injury) ([place]) 03/30/2017  . CKD stage 3 due to type 2 diabetes mellitus ([place]) 03/30/2017  . Sepsis ([place]) 03/30/2017  . Heart murmur 11/22/2016  . Hyperlipidemia associated with type 2 diabetes mellitus ([place]) 11/22/2016  . Obstructive sleep apnea syndrome 11/22/2016  . Essential hypertension 12/17/2012   PCP:  [name], MD Pharmacy:   [facility], [address] [place]  [address] Phone: [phone] Fax: [phone]     Social Determinants of Health (SDOH) Interventions    Readmission Risk Interventions No flowsheet data found.

## 2018-12-18 NOTE — Progress Notes (Addendum)
PROGRESS NOTE    Trevor [name]  [name] DOB: January 25, 1955 DOA: 12/17/2018 PCP: [name], MD      Brief Narrative:  Trevor [name] is a 63 y.o. M with IDDM, HTN, OSA and R BKA who presented for left foot pain.  Patient's left foot has had severe skin cracking for a while.  Recently told this was fungus, started on ketoconazole cream.  Now in the last few days, his foot is swelling, red and tender.  No fever, chills.  In the ER, temp 99.8F, WBC normal, lactic acid 3.4.  X-ray showed no bony destruction but suggested gas in the hindfoot.  He was started on clindamycin and admitted.      Assessment & Plan:  Diabetic foot infection X-ray personally reviewed.  I do not think this is gas, I suspect the artifact seen as gas is actually his severely cracked skin. -Check ABI -Stop clinda -Start vancomycin, ceftriaxone, Flagyl -Consult Orthopedics/Podiatry  ADDENDUM: Spoke with Dr. Cleda Mccreedy.  Agrees there is no soft tissue air.  He feels maybe not even cellulitis, although we agree to treat with Augmentin for a short course at discharge.  Regarding tinea pedis, he strongly recommends dermatology follow up.  We will arrange follow up with Dr. Aubery Lapping, Derm in [place].  In addition, we will make sure the patient has follow up with his regular podiatrist, Dr. Prudence Davidson.  Lastly, Dr. Cleda Mccreedy suggests replacing topical ketoconazole cream for the fungus with GrisPEG.  This was reviewed with [facility] and does not interact with any of his prescribed meds.  Could do two weeks, or stick with ketoconazole if GrisPEG too expensive.       Lactic acidosis No tachycardia, leukocytosis or fever, sepsis ruled out.  Lactic acidemia probably from metformin. -Hold metformin -Continue IV fluids  Diabetes No insulin given last night, glucoses elevated this morning -Continue home Levemir -Continue sliding scale corrections and mealtime insulin -Hold home Metformin -Hold home glipizide -Continue  aspirin, atorvastatin  Hypertension Blood pressure elevated -Continue atenolol, losartan, nifedipine, spironolactone  Sleep apnea -CPAP at night        DVT prophylaxis: Lovenox Code Status: FULL Family Communication:  MDM and disposition Plan: This is a no charge note.  For further details, please see H&P by my partner Dr. Sidney Ace from earlier today.  The below labs and imaging reports were reviewed and summarized above.    The patient was admitted with left foot diabetic foot infection in his only foot.    Objective: Vitals:   12/17/18 1936 12/18/18 0039 12/18/18 0102 12/18/18 0733  BP: (!) 163/82 (!) 165/70 (!) 168/98 (!) 169/86  Pulse: 76 75 68 70  Resp: 18 19 19    Temp: 99.2 F (37.3 C) 99 F (37.2 C) 97.9 F (36.6 C)   TempSrc: Oral Oral Oral   SpO2: 100% 100% 98% 97%  Weight:   (!) 143.7 kg   Height:   6\' 4"  (1.93 m)     Intake/Output Summary (Last 24 hours) at 12/18/2018 1335 Last data filed at 12/18/2018 0400 Gross per 24 hour  Intake 1571.6 ml  Output -  Net 1571.6 ml   Filed Weights   12/18/18 0102  Weight: (!) 143.7 kg    Examination: The patient was seen and examined.      Data Reviewed: I have personally reviewed following labs and imaging studies:  CBC: Recent Labs  Lab 12/17/18 1938  WBC 9.0  NEUTROABS 6.8  HGB 15.5  HCT 46.4  MCV  80.7  PLT [id number]   Basic Metabolic Panel: Recent Labs  Lab 12/17/18 1938  NA 137  K 3.5  CL 101  CO2 25  GLUCOSE 303*  BUN 27*  CREATININE 1.06  CALCIUM 9.2   GFR: Estimated Creatinine Clearance: 112 mL/min (by C-G formula based on SCr of 1.06 mg/dL). Liver Function Tests: Recent Labs  Lab 12/17/18 1938  AST 22  ALT 18  ALKPHOS 85  BILITOT 0.5  PROT 7.6  ALBUMIN 4.3   No results for input(s): LIPASE, AMYLASE in the last 168 hours. No results for input(s): AMMONIA in the last 168 hours. Coagulation Profile: No results for input(s): INR, PROTIME in the last 168 hours. Cardiac Enzymes:  No results for input(s): CKTOTAL, CKMB, CKMBINDEX, TROPONINI in the last 168 hours. BNP (last 3 results) No results for input(s): PROBNP in the last 8760 hours. HbA1C: No results for input(s): HGBA1C in the last 72 hours. CBG: Recent Labs  Lab 12/18/18 0219 12/18/18 1236  GLUCAP 188* 222*   Lipid Profile: No results for input(s): CHOL, HDL, LDLCALC, TRIG, CHOLHDL, LDLDIRECT in the last 72 hours. Thyroid Function Tests: No results for input(s): TSH, T4TOTAL, FREET4, T3FREE, THYROIDAB in the last 72 hours. Anemia Panel: No results for input(s): VITAMINB12, FOLATE, FERRITIN, TIBC, IRON, RETICCTPCT in the last 72 hours. Urine analysis:    Component Value Date/Time   COLORURINE STRAW (A) 04/08/2017 1447   APPEARANCEUR CLEAR 04/08/2017 1447   LABSPEC 1.006 04/08/2017 1447   PHURINE 7.0 04/08/2017 1447   GLUCOSEU 50 (A) 04/08/2017 1447   HGBUR MODERATE (A) 04/08/2017 1447   BILIRUBINUR NEGATIVE 04/08/2017 1447   KETONESUR NEGATIVE 04/08/2017 1447   PROTEINUR NEGATIVE 04/08/2017 1447   NITRITE NEGATIVE 04/08/2017 1447   LEUKOCYTESUR NEGATIVE 04/08/2017 1447   Sepsis Labs: @LABRCNTIP (procalcitonin:4,lacticacidven:4)  ) Recent Results (from the past 240 hour(s))  Blood culture (routine x 2)     Status: None (Preliminary result)   Collection Time: 12/17/18  7:38 PM   Specimen: BLOOD  Result Value Ref Range Status   Specimen Description BLOOD BLOOD RIGHT HAND  Final   Special Requests   Final    BOTTLES DRAWN AEROBIC AND ANAEROBIC Blood Culture results may not be optimal due to an excessive volume of blood received in culture bottles   Culture   Final    NO GROWTH < 12 HOURS Performed at [facility], [address] [place], [place] [address]    Report Status PENDING  Incomplete  Blood culture (routine x 2)     Status: None (Preliminary result)   Collection Time: 12/17/18 11:31 PM   Specimen: BLOOD  Result Value Ref Range Status   Specimen Description BLOOD RIGHT  ANTECUBITAL  Final   Special Requests   Final    BOTTLES DRAWN AEROBIC AND ANAEROBIC Blood Culture adequate volume   Culture   Final    NO GROWTH < 12 HOURS Performed at [facility], [address] [place], [place] [address]    Report Status PENDING  Incomplete  SARS CORONAVIRUS 2 (TAT 6-24 HRS) Nasopharyngeal Nasopharyngeal Swab     Status: None   Collection Time: 12/17/18 11:31 PM   Specimen: Nasopharyngeal Swab  Result Value Ref Range Status   SARS Coronavirus 2 NEGATIVE NEGATIVE Final    Comment: (NOTE) SARS-CoV-2 target nucleic acids are NOT DETECTED. The SARS-CoV-2 RNA is generally detectable in upper and lower respiratory specimens during the acute phase of infection. Negative results do not preclude SARS-CoV-2 infection, do  not rule out co-infections with other pathogens, and should not be used as the sole basis for treatment or other patient management decisions. Negative results must be combined with clinical observations, patient history, and epidemiological information. The expected result is Negative. Fact Sheet for Patients: SugarRoll.be Fact Sheet for Healthcare Providers: https://www.woods-mathews.com/ This test is not yet approved or cleared by the [place] FDA and  has been authorized for detection and/or diagnosis of SARS-CoV-2 by FDA under an Emergency Use Authorization (EUA). This EUA will remain  in effect (meaning this test can be used) for the duration of the COVID-19 declaration under Section 56 4(b)(1) of the Act, 21 U.S.C. section 360bbb-3(b)(1), unless the authorization is terminated or revoked sooner. Performed at [facility], [place] [address], [place], [place] [address]          Radiology Studies: Dg Foot 2 Views Left  Result Date: 12/17/2018 CLINICAL DATA:  Wound EXAM: LEFT FOOT - 2 VIEW COMPARISON:  None. FINDINGS: There is soft tissue swelling about the foot. There are  findings suspicious for pockets of subcutaneous gas at the level of the hindfoot. There is no definite radiographic evidence for osteomyelitis. There is a small plantar calcaneal spur. There is no radiopaque foreign body. There is no acute displaced fracture or dislocation. IMPRESSION: 1. Soft tissue swelling with findings suspicious for subcutaneous gas at the level of the hindfoot. No definite radiographic evidence for osteomyelitis. Findings are concerning for a soft tissue infection. 2. No acute fracture or dislocation. Electronically Signed   By: Constance Holster M.D.   On: 12/17/2018 22:20        Scheduled Meds: . aspirin EC  81 mg Oral Daily  . atenolol  100 mg Oral Daily  . atorvastatin  40 mg Oral q1800  . enoxaparin (LOVENOX) injection  40 mg Subcutaneous Q24H  . fluticasone  1 spray Each Nare Daily  . insulin aspart  0-15 Units Subcutaneous TID WC  . insulin aspart  0-5 Units Subcutaneous QHS  . insulin aspart  4 Units Subcutaneous TID WC  . insulin detemir  34 Units Subcutaneous Daily  . losartan  100 mg Oral Daily  . NIFEdipine  120 mg Oral Daily  . spironolactone  25 mg Oral Daily   Continuous Infusions: . cefTRIAXone (ROCEPHIN)  IV 2 g (12/18/18 0950)  . metronidazole 500 mg (12/18/18 0850)  . vancomycin 1,250 mg (12/18/18 0845)     LOS: 1 day    Time spent: 15 minutes    Edwin Dada, MD [facility] 12/18/2018, 1:35 PM     Please page though [place] or Epic secure chat:  For password, contact charge nurse

## 2018-12-18 NOTE — Consult Note (Signed)
[facility] Nurse Consult Note: Patient receiving care in [facility].  Consult completed remotely after review of record, including image of left foot. Reason for Consult: "Lower extremity wound" Wound type: Unclear etiology at this time.  Xray of foot revealed: "IMPRESSION: 1. Soft tissue swelling with findings suspicious for subcutaneous gas at the level of the hindfoot"  I have sent Dr. Hulen Luster a SecureChat message asking him to consult podiatry as gas in the soft tissues exceeds the scope of practice of the [facility] nurse. Val Riles, RN, MSN, CWOCN, CNS-BC, pager [phone]

## 2018-12-18 NOTE — Progress Notes (Addendum)
Pharmacy Antibiotic Note  Trevor [name] is a 63 y.o. male admitted on 12/17/2018 with open wound to left heel.  Pharmacy has been consulted for vancomycin dosing. Patient also on Rocephin.  Plan: Patient received vanc 1 g IV x 1 in ED. Will proceed with vancomycin 1250 mg IV Q 12 hrs Goal AUC 400-550 Expected AUC: 445 SCr used: 1.06   Height: 6\' 4"  (193 cm) Weight: (!) 316 lb 12.8 oz (143.7 kg) IBW/kg (Calculated) : 86.8  Temp (24hrs), Avg:98.7 F (37.1 C), Min:97.9 F (36.6 C), Max:99.2 F (37.3 C)  Recent Labs  Lab 12/17/18 1938 12/18/18 0156 12/18/18 0510  WBC 9.0  --   --   CREATININE 1.06  --   --   LATICACIDVEN 3.4* 2.6* 2.7*    Estimated Creatinine Clearance: 112 mL/min (by C-G formula based on SCr of 1.06 mg/dL).    Allergies  Allergen Reactions  . Enalapril Cough    Antimicrobials this admission: Unasyn 12/7 x 1 dose Vancomycin 12/7 >> Rocephin 12/8 >> Flagyl 12/8 >>  Dose adjustments this admission: NA  Microbiology results: 12/7 BCx: NGTD  Thank you for allowing pharmacy to be a part of this patient's care.  Tawnya Crook, PharmD 12/18/2018 1:37 PM

## 2018-12-18 NOTE — Consult Note (Signed)
Reason for Consult: Possible infection left foot with diabetes and neuropathy. Referring Physician: Suman [name] is an 63 y.o. male.  HPI: This is a 63 year old diabetic male with chronic scaly callused areas on his left foot.  Recently developed some increased pain with some swelling and redness.  Presented to emergency department where x-rays showed some possible gas in the tissues and he was admitted for evaluation.  States he has recently started using some antifungal cream on the left foot.  Denies any history of injury.  Past Medical History:  Diagnosis Date  . Diabetes mellitus without complication ([place])   . Heart murmur   . Hyperlipidemia   . Hypertension   . Sleep apnea     Past Surgical History:  Procedure Laterality Date  . AMPUTATION Right 03/31/2017   Procedure: RIGHT FOOT 1ST AND 2ND RAY AMPUTATION;  Surgeon: Trevor Minion, Trevor [name];  Location: [facility];  Service: Orthopedics;  Laterality: Right;  . AMPUTATION Right 04/01/2017   Procedure: AMPUTATION BELOW KNEE;  Surgeon: Trevor Minion, Trevor [name];  Location: [place] [facility];  Service: Orthopedics;  Laterality: Right;  . COLONOSCOPY WITH PROPOFOL N/A 10/02/2017   Procedure: COLONOSCOPY WITH PROPOFOL;  Surgeon: Trevor Bellows, Trevor [name];  Location[facility];  Service: Gastroenterology;  Laterality: N/A;  . CORNEAL TRANSPLANT      History reviewed. No pertinent family history.  Social History:  reports that he has never smoked. He has never used smokeless tobacco. He reports that he does not drink alcohol or use drugs.  Allergies:  Allergies  Allergen Reactions  . Enalapril Cough    Medications:  Scheduled: . aspirin EC  81 mg Oral Daily  . atenolol  100 mg Oral Daily  . atorvastatin  40 mg Oral q1800  . enoxaparin (LOVENOX) injection  40 mg Subcutaneous Q24H  . fluticasone  1 spray Each Nare Daily  . insulin aspart  0-15 Units Subcutaneous TID WC  . insulin aspart  0-5 Units Subcutaneous QHS  . insulin aspart  4 Units  Subcutaneous TID WC  . insulin detemir  34 Units Subcutaneous Daily  . losartan  100 mg Oral Daily  . NIFEdipine  120 mg Oral Daily  . spironolactone  25 mg Oral Daily    Results for orders placed or performed during the hospital encounter of 12/17/18 (from the past 48 hour(s))  Lactic acid, plasma     Status: Abnormal   Collection Time: 12/17/18  7:38 PM  Result Value Ref Range   Lactic Acid, Venous 3.4 (HH) 0.5 - 1.9 mmol/L    Comment: CRITICAL RESULT CALLED TO, READ BACK BY AND VERIFIED WITH Trevor [name] AT 2016 12/17/2018  TFK Performed at [facility], [address] [place], [place] [address]   Comprehensive metabolic panel     Status: Abnormal   Collection Time: 12/17/18  7:38 PM  Result Value Ref Range   Sodium 137 135 - 145 mmol/L   Potassium 3.5 3.5 - 5.1 mmol/L   Chloride 101 98 - 111 mmol/L   CO2 25 22 - 32 mmol/L   Glucose, Bld 303 (H) 70 - 99 mg/dL   BUN 27 (H) 8 - 23 mg/dL   Creatinine, Ser 1.06 0.61 - 1.24 mg/dL   Calcium 9.2 8.9 - 10.3 mg/dL   Total Protein 7.6 6.5 - 8.1 g/dL   Albumin 4.3 3.5 - 5.0 g/dL   AST 22 15 - 41 U/L   ALT 18 0 - 44 U/L   Alkaline  Phosphatase 85 38 - 126 U/L   Total Bilirubin 0.5 0.3 - 1.2 mg/dL   GFR calc non Af Amer >60 >60 mL/min   GFR calc Af Amer >60 >60 mL/min   Anion gap 11 5 - 15    Comment: Performed at [facility], [address] [place], [place] [address]  CBC with Differential     Status: None   Collection Time: 12/17/18  7:38 PM  Result Value Ref Range   WBC 9.0 4.0 - 10.5 K/uL   RBC 5.75 4.22 - 5.81 MIL/uL   Hemoglobin 15.5 13.0 - 17.0 g/dL   HCT 46.4 39.0 - 52.0 %   MCV 80.7 80.0 - 100.0 fL   MCH 27.0 26.0 - 34.0 pg   MCHC 33.4 30.0 - 36.0 g/dL   RDW 13.9 11.5 - 15.5 %   Platelets 231 150 - 400 K/uL   nRBC 0.0 0.0 - 0.2 %   Neutrophils Relative % 76 %   Neutro Abs 6.8 1.7 - 7.7 K/uL   Lymphocytes Relative 14 %   Lymphs Abs 1.3 0.7 - 4.0 K/uL   Monocytes Relative 8 %   Monocytes  Absolute 0.8 0.1 - 1.0 K/uL   Eosinophils Relative 2 %   Eosinophils Absolute 0.2 0.0 - 0.5 K/uL   Basophils Relative 0 %   Basophils Absolute 0.0 0.0 - 0.1 K/uL   Immature Granulocytes 0 %   Abs Immature Granulocytes 0.03 0.00 - 0.07 K/uL    Comment: Performed at [facility], [address] [place], [place] [address]  Blood culture (routine x 2)     Status: None (Preliminary result)   Collection Time: 12/17/18  7:38 PM   Specimen: BLOOD  Result Value Ref Range   Specimen Description BLOOD BLOOD RIGHT HAND    Special Requests      BOTTLES DRAWN AEROBIC AND ANAEROBIC Blood Culture results may not be optimal due to an excessive volume of blood received in culture bottles   Culture  Setup Time      AEROBIC BOTTLE ONLY GRAM POSITIVE RODS CRITICAL RESULT CALLED TO, READ BACK BY AND VERIFIED WITH: CHARLES SHANTEVER AT [id number] ON 12/18/2018 JJB Performed at [facility], [address], [place], [place] [address]    Culture GRAM POSITIVE RODS    Report Status PENDING   Blood culture (routine x 2)     Status: None (Preliminary result)   Collection Time: 12/17/18 11:31 PM   Specimen: BLOOD  Result Value Ref Range   Specimen Description BLOOD RIGHT ANTECUBITAL    Special Requests      BOTTLES DRAWN AEROBIC AND ANAEROBIC Blood Culture adequate volume   Culture      NO GROWTH < 12 HOURS Performed at [facility], [address] [place], [place] [address]    Report Status PENDING   SARS CORONAVIRUS 2 (TAT 6-24 HRS) Nasopharyngeal Nasopharyngeal Swab     Status: None   Collection Time: 12/17/18 11:31 PM   Specimen: Nasopharyngeal Swab  Result Value Ref Range   SARS Coronavirus 2 NEGATIVE NEGATIVE    Comment: (NOTE) SARS-CoV-2 target nucleic acids are NOT DETECTED. The SARS-CoV-2 RNA is generally detectable in upper and lower respiratory specimens during the acute phase of infection. Negative results do not preclude SARS-CoV-2 infection, do not rule  out co-infections with other pathogens, and should not be used as the sole basis for treatment or other patient management decisions. Negative results must be combined with clinical observations,  patient history, and epidemiological information. The expected result is Negative. Fact Sheet for Patients: SugarRoll.be Fact Sheet for Healthcare Providers: https://www.[name]-mathews.com/ This test is not yet approved or cleared by the [place] FDA and  has been authorized for detection and/or diagnosis of SARS-CoV-2 by FDA under an Emergency Use Authorization (EUA). This EUA will remain  in effect (meaning this test can be used) for the duration of the COVID-19 declaration under Section 56 4(b)(1) of the Act, 21 U.S.C. section 360bbb-3(b)(1), unless the authorization is terminated or revoked sooner. Performed at [facility], [place] [address], [place], [place] [address]   HIV Antibody (routine testing w rflx)     Status: None   Collection Time: 12/18/18  1:56 AM  Result Value Ref Range   HIV Screen 4th Generation wRfx NON REACTIVE NON REACTIVE    Comment: Performed at [facility] [name], [place], [place] [address]  Lactic acid, plasma     Status: Abnormal   Collection Time: 12/18/18  1:56 AM  Result Value Ref Range   Lactic Acid, Venous 2.6 (HH) 0.5 - 1.9 mmol/L    Comment: CRITICAL VALUE NOTED. VALUE IS CONSISTENT WITH PREVIOUSLY REPORTED/CALLED VALUE RWW Performed at [facility], [address] [place], [place] [address]   Glucose, capillary     Status: Abnormal   Collection Time: 12/18/18  2:19 AM  Result Value Ref Range   Glucose-Capillary 188 (H) 70 - 99 mg/dL   Comment 1 Notify Trevor [name]   Lactic acid, plasma     Status: Abnormal   Collection Time: 12/18/18  5:10 AM  Result Value Ref Range   Lactic Acid, Venous 2.7 (HH) 0.5 - 1.9 mmol/L    Comment: CRITICAL VALUE NOTED. VALUE IS CONSISTENT WITH PREVIOUSLY  REPORTED/CALLED VALUE  SDR Performed at [facility], [address] [place], [place] [address]   Hemoglobin A1c     Status: Abnormal   Collection Time: 12/18/18  5:10 AM  Result Value Ref Range   Hgb A1c MFr Bld 9.6 (H) 4.8 - 5.6 %    Comment: (NOTE) Pre diabetes:          5.7%-6.4% Diabetes:              >6.4% Glycemic control for   <7.0% adults with diabetes    Mean Plasma Glucose 228.82 mg/dL    Comment: Performed at [facility] [address], [place], [place] [address]  Glucose, capillary     Status: Abnormal   Collection Time: 12/18/18 12:36 PM  Result Value Ref Range   Glucose-Capillary 222 (H) 70 - 99 mg/dL    Dg Foot 2 Views Left  Result Date: 12/17/2018 CLINICAL DATA:  Wound EXAM: LEFT FOOT - 2 VIEW COMPARISON:  None. FINDINGS: There is soft tissue swelling about the foot. There are findings suspicious for pockets of subcutaneous gas at the level of the hindfoot. There is no definite radiographic evidence for osteomyelitis. There is a small plantar calcaneal spur. There is no radiopaque foreign body. There is no acute displaced fracture or dislocation. IMPRESSION: 1. Soft tissue swelling with findings suspicious for subcutaneous gas at the level of the hindfoot. No definite radiographic evidence for osteomyelitis. Findings are concerning for a soft tissue infection. 2. No acute fracture or dislocation. Electronically Signed   By: Constance Holster M.D.   On: 12/17/2018 22:20    Review of Systems  Constitutional: Negative for chills and fever.  HENT: Negative for  congestion and sinus pain.   Eyes: Negative for blurred vision and double vision.  Respiratory: Negative for cough and shortness of breath.   Cardiovascular: Negative for chest pain and palpitations.  Gastrointestinal: Negative for nausea and vomiting.  Genitourinary: Negative for dysuria and frequency.  Musculoskeletal:       Previous below-knee amputation of the right lower extremity.  Some  recent increased pain in the left foot and lower leg.  Skin:       Chronic scaly skin lesions on his left foot.  Relates some increased swelling and inflammation in the left foot recently.  Does not relate any injury or open sores  Neurological:       Patient relates some significant neuropathy associated with his diabetes.  Endo/Heme/Allergies: Does not bruise/bleed easily.  Psychiatric/Behavioral: Negative for depression. The patient is not nervous/anxious.    Blood pressure (!) 169/86, pulse 70, temperature 97.9 F (36.6 C), temperature source Oral, resp. rate 19, height 6\' 4"  (1.93 m), weight (!) 143.7 kg, SpO2 97 %. Physical Exam  Cardiovascular:  DP and PT pulses are significantly diminished and barely palpable on the left foot.  Previous below-knee amputation on the right.  Musculoskeletal:     Comments: Adequate range of motion of the pedal joints on the left.  Muscle testing deferred.  Previous BKA on the right.  Neurological:  Loss of protective threshold with a monofilament wire in the left foot and toes.  Proprioception impaired.  Skin:  The skin is warm dry and for the most part supple with some diminished hair growth.  Some edema in the left foot and lower leg.  Diffuse scaly hyperkeratotic areas with multiple fissures with a couple of superficial ulcerative areas noted on the left foot.  No purulence or sign of infection.    Assessment/Plan: Assessment: 1.  Diabetes with associated neuropathy. 2.  Multiple callused areas with fissuring and superficial ulceration left foot. 3.  Outpatient evidence for tinea pedis.  Plan: Debrided multiple areas of the hyperkeratosis on the left foot sharply with a 15 blade and tissue nippers.  This was excisional and very superficial.  At this point we will apply Bactroban ointment to the cracked ulcerative areas followed by a bandage on the left foot.  Discussed with the patient that outpatient his results did show some fungus in the biopsied  site and we discussed going on to some oral antifungal medication as well as his topical medication.  Also discussed that I would like to have him follow-up with dermatology for a second opinion.  At this point I do not see any signs of infection and think the soft tissue air on the x-rays was most likely from the cracked callused areas.  Discussed with the patient concerns of possible early Charcot deformity showing up in the foot.  He is at significant risk eventually with previous amputation of the right lower extremity.  Plan for follow-up outpatient in the next couple of weeks for reevaluation and possible rex-rays.  Trevor [name] 12/18/2018, 3:45 PM

## 2018-12-18 NOTE — H&P (Signed)
[facility] NAME: Trevor [name]    MR#:  [id number]  DATE OF BIRTH:  01/30/1955  DATE OF ADMISSION:  12/17/2018  PRIMARY CARE PHYSICIAN: [name], MD   REQUESTING/REFERRING PHYSICIAN: Arta Silence, MD  CHIEF COMPLAINT:   Chief Complaint  Patient presents with  . Wound Infection  . Hyperglycemia    HISTORY OF PRESENT ILLNESS:  Trevor [name]  is a 63 y.o. African-American male with a known history of type 2 diabetes mellitus, hypertension, dyslipidemia and obstructive sleep apnea, who presented to the emergency room with acute onset of worsening left plantar foot swelling and pain with minimal redness and warmth as well as tenderness while walking.  He denied any fever or chills.  No dyspnea or cough.  No recent sick exposure to COVID-19.  He denied any nausea or vomiting or abdominal pain.  No dysuria, oliguria or hematuria or flank pain.  Upon presentation to the emergency room, blood pressure 163/82 with a temperature of 99.2 and otherwise normal vital signs.  Labs revealed borderline potassium of 3.5 and a blood glucose of 303.  Lactic acid was elevated at 3.4 and CBC was within normal.  COVID-19 test is currently pending.  Blood cultures were sent.  Two-view left foot x-ray revealed soft tissue swelling with findings suspicious for subcutaneous gas at the level of the hindfoot with no evidence for osteomyelitis however with findings concerning for soft tissue infection and no acute fracture or dislocation.  The patient was given IV Unasyn, vancomycin and 1 L bolus of IV normal saline.  He will be admitted to a medical bed for further evaluation and management. PAST MEDICAL HISTORY:   Past Medical History:  Diagnosis Date  . Diabetes mellitus without complication ([place])   . Heart murmur   . Hyperlipidemia   . Hypertension   . Sleep apnea     PAST SURGICAL HISTORY:   Past Surgical History:  Procedure Laterality Date  .  AMPUTATION Right 03/31/2017   Procedure: RIGHT FOOT 1ST AND 2ND RAY AMPUTATION;  Surgeon: Newt Minion, MD;  Location: [facility];  Service: Orthopedics;  Laterality: Right;  . AMPUTATION Right 04/01/2017   Procedure: AMPUTATION BELOW KNEE;  Surgeon: Newt Minion, MD;  Location: [place] [facility];  Service: Orthopedics;  Laterality: Right;  . COLONOSCOPY WITH PROPOFOL N/A 10/02/2017   Procedure: COLONOSCOPY WITH PROPOFOL;  Surgeon: [name], MD;  Location: [facility] ENDOSCOPY;  Service: Gastroenterology;  Laterality: N/A;  . CORNEAL TRANSPLANT      SOCIAL HISTORY:   Social History   Tobacco Use  . Smoking status: Never Smoker  . Smokeless tobacco: Never Used  Substance Use Topics  . Alcohol use: No    FAMILY HISTORY:  Positive for diabetes mellitus and hypertension in his grandmother.  DRUG ALLERGIES:   Allergies  Allergen Reactions  . Enalapril Cough    REVIEW OF SYSTEMS:   ROS As per history of present illness. All pertinent systems were reviewed above. Constitutional,  HEENT, cardiovascular, respiratory, GI, GU, musculoskeletal, neuro, psychiatric, endocrine,  integumentary and hematologic systems were reviewed and are otherwise  negative/unremarkable except for positive findings mentioned above in the HPI.   MEDICATIONS AT HOME:   Prior to Admission medications   Medication Sig Start Date End Date Taking? Authorizing Provider  ACCU-CHEK AVIVA PLUS test strip USE AS DIRECTED TO CHECK BLOOD SUGAR THREE TIMES A DAY 06/11/18  Yes [provider]  acetaminophen (TYLENOL) 325 MG tablet Take  2 tablets (650 mg total) by mouth every 6 (six) hours as needed for mild pain (or Fever >/= 101). 04/12/17  Yes Katherine Roan, MD  aspirin EC 81 MG tablet Take 81 mg by mouth daily.   Yes [provider]  atenolol (TENORMIN) 100 MG tablet Take 100 mg by mouth daily.    Yes [provider]  atorvastatin (LIPITOR) 40 MG tablet Take 40 mg by mouth daily. 11/10/18  Yes [provider]  BD INSULIN SYRINGE U/F 31G X 5/16" 1 ML MISC  07/23/18  Yes [provider]  glipiZIDE (GLUCOTROL) 10 MG tablet Take 10 mg by mouth 2 (two) times daily before a meal.   Yes [provider]  insulin aspart (NOVOLOG) 100 UNIT/ML FlexPen Inject 10 Units into the skin 3 (three) times daily with meals. 04/27/17  Yes Angiulli, Lavon Paganini, PA-C  insulin detemir (LEVEMIR) 100 unit/ml SOLN Inject 0.34 mLs (34 Units total) into the skin at bedtime. 04/27/17  Yes Angiulli, Lavon Paganini, PA-C  losartan (COZAAR) 100 MG tablet Take 100 mg by mouth daily.   Yes [provider]  metFORMIN (GLUCOPHAGE) 1000 MG tablet Take 1,000 mg by mouth 2 (two) times daily with a meal.    Yes [provider]  NIFEdipine (PROCARDIA-XL/NIFEDICAL-XL) 30 MG 24 hr tablet Take 120 mg by mouth daily.   Yes [provider]  polyethylene glycol (MIRALAX / GLYCOLAX) packet Take 17 g by mouth daily as needed for mild constipation. 04/04/17  Yes Colbert Ewing, MD  spironolactone (ALDACTONE) 25 MG tablet Take 25 mg by mouth daily.   Yes [provider]  ammonium lactate (LAC-HYDRIN) 12 % cream Apply topically as needed for dry skin. Patient not taking: Reported on 12/17/2018 02/05/18   Gardiner Barefoot, DPM  potassium chloride SA (KLOR-CON M20) 20 MEQ tablet Take 1 tablet (20 mEq total) by mouth daily. Patient not taking: Reported on 12/17/2018 10/25/18   Hinda Kehr, MD  traMADol (ULTRAM) 50 MG tablet Take 0.5-1 tablets (25-50 mg total) by mouth every 8 (eight) hours as needed for severe pain. Patient not taking: Reported on 11/09/2018 04/27/17   Angiulli, Lavon Paganini, PA-C      VITAL SIGNS:  Blood pressure (!) 163/82, pulse 76, temperature 99.2 F (37.3 C), temperature source Oral, resp. rate 18, SpO2 100 %.  PHYSICAL EXAMINATION:  Physical Exam  GENERAL:  63 y.o.-year-old African-American male patient lying in the bed with no acute distress.  EYES: Pupils equal, round,  reactive to light and accommodation. No scleral icterus. Extraocular muscles intact.  HEENT: Head atraumatic, normocephalic. Oropharynx and nasopharynx clear.  NECK:  Supple, no jugular venous distention. No thyroid enlargement, no tenderness.  LUNGS: Normal breath sounds bilaterally, no wheezing, rales,rhonchi or crepitation. No use of accessory muscles of respiration.  CARDIOVASCULAR: Regular rate and rhythm, S1, S2 normal. No murmurs, rubs, or gallops.  ABDOMEN: Soft, nondistended, nontender. Bowel sounds present. No organomegaly or mass.  EXTREMITIES: He has right below-knee amputation with intact stump.  He has left leg 1+ pitting edema with no clubbing or cyanosis.  Left foot skin as below. NEUROLOGIC: Cranial nerves II through XII are intact. Muscle strength 5/5 in all extremities. Sensation intact. Gait not checked.  PSYCHIATRIC: The patient is alert and oriented x 3.  Normal affect and good eye contact. SKIN: Left foot dorsal minimal edema and plantar swelling with associated mid and hind plantar foot tenderness with mild warmth and minimal redness and surrounding significant amount of callus  covering most of the foot with cracks but could be the source of infection.  No purulent drainage.   LABORATORY PANEL:   CBC Recent Labs  Lab 12/17/18 1938  WBC 9.0  HGB 15.5  HCT 46.4  PLT 231   ------------------------------------------------------------------------------------------------------------------  Chemistries  Recent Labs  Lab 12/17/18 1938  NA 137  K 3.5  CL 101  CO2 25  GLUCOSE 303*  BUN 27*  CREATININE 1.06  CALCIUM 9.2  AST 22  ALT 18  ALKPHOS 85  BILITOT 0.5   ------------------------------------------------------------------------------------------------------------------  Cardiac Enzymes No results for input(s): TROPONINI in the last 168 hours.  ------------------------------------------------------------------------------------------------------------------  RADIOLOGY:  Dg Foot 2 Views Left  Result Date: 12/17/2018 CLINICAL DATA:  Wound EXAM: LEFT FOOT - 2 VIEW COMPARISON:  None. FINDINGS: There is soft tissue swelling about the foot. There are findings suspicious for pockets of subcutaneous gas at the level of the hindfoot. There is no definite radiographic evidence for osteomyelitis. There is a small plantar calcaneal spur. There is no radiopaque foreign body. There is no acute displaced fracture or dislocation. IMPRESSION: 1. Soft tissue swelling with findings suspicious for subcutaneous gas at the level of the hindfoot. No definite radiographic evidence for osteomyelitis. Findings are concerning for a soft tissue infection. 2. No acute fracture or dislocation. Electronically Signed   By: Constance Holster M.D.   On: 12/17/2018 22:20      IMPRESSION AND PLAN:   1.  Left plantar foot moderate nonpurulent diabetic cellulitis likely secondary to infected callus wound with elevated lactic acid 3.4.  The patient will be admitted to a medical bed.  We will continue IV antibiotic therapy with IV clindamycin.  We will follow lactic acid level.  2.  Uncontrolled type II diabetes mellitus.  The patient will be placed on supplemental coverage with NovoLog and will continue Glucotrol as well as basal coverage.  We will hold off Metformin for now.  3.  Hypertension.  We will continue Cozaar and Tenormin.  4.  Dyslipidemia.  We will continue statin therapy.  5.  DVT prophylaxis.  Subcutaneous Lovenox   All the records are reviewed and case discussed with ED provider. The plan of care was discussed in details with the patient (and family). I answered all questions. The patient agreed to proceed with the above mentioned plan. Further management will depend upon hospital course.   CODE STATUS: Full code  TOTAL TIME TAKING CARE OF THIS  PATIENT: 50 minutes.    Christel Mormon M.D on 12/18/2018 at 12:08 AM  [facility]   From 7 PM-7 AM, contact night-coverage [email or web address]  CC: Primary care physician; [name], MD   Note: This dictation was prepared with Dragon dictation along with smaller phrase technology. Any transcriptional errors that result from this process are unintentional.

## 2018-12-18 NOTE — ED Notes (Signed)
ED TO INPATIENT HANDOFF REPORT  ED Nurse Name and Phone #: Willene Hatchet Name/Age/Gender Trevor [name] 63 y.o. male Room/Bed: ED12HA/ED12HA  Code Status   Code Status: Full Code  Home/SNF/Other Home Patient oriented to: self, place, time and situation Is this baseline? Yes   Triage Complete: Triage complete  Chief Complaint High Blood Sugar  Triage Note Pt reports open wound to the left heel with bleeding. Area is cracked open with dry blood noted. Pt is diabetic and missed his insulin dose today. [facility] and sent to ED due to wound, elevated blood sugar as well as 2+ pitting edema to the left lower leg. Pt denies fever.    Allergies Allergies  Allergen Reactions  . Enalapril Cough    Level of Care/Admitting Diagnosis ED Disposition    ED Disposition Condition [facility] Area: [place] [[id number]]  Level of Care: Med-Surg [16]  Covid Evaluation: Asymptomatic Screening Protocol (No Symptoms)  Diagnosis: Cellulitis of foot, left [id number]  Admitting Physician: Christel Mormon [id number]  Attending Physician: Christel Mormon [id number]  Estimated length of stay: past midnight tomorrow  Certification:: I certify this patient will need inpatient services for at least 2 midnights  PT Class (Do Not Modify): Inpatient [101]  PT Acc Code (Do Not Modify): Private [1]       B Medical/Surgery History Past Medical History:  Diagnosis Date  . Diabetes mellitus without complication ([place])   . Heart murmur   . Hyperlipidemia   . Hypertension   . Sleep apnea    Past Surgical History:  Procedure Laterality Date  . AMPUTATION Right 03/31/2017   Procedure: RIGHT FOOT 1ST AND 2ND RAY AMPUTATION;  Surgeon: Newt Minion, MD;  Location: [place];  Service: Orthopedics;  Laterality: Right;  . AMPUTATION Right 04/01/2017   Procedure: AMPUTATION BELOW KNEE;  Surgeon: Newt Minion, MD;  Location: [place];  Service: Orthopedics;   Laterality: Right;  . COLONOSCOPY WITH PROPOFOL N/A 10/02/2017   Procedure: COLONOSCOPY WITH PROPOFOL;  Surgeon: [name], MD;  Location: [facility] ENDOSCOPY;  Service: Gastroenterology;  Laterality: N/A;  . CORNEAL TRANSPLANT       A IV Location/Drains/Wounds Patient Lines/Drains/Airways Status   Active Line/Drains/Airways    Name:   Placement date:   Placement time:   Site:   Days:   Peripheral IV 12/17/18 Right Hand   12/17/18    1937    Hand   1   Peripheral IV 12/17/18 Right Antecubital   12/17/18    2335    Antecubital   1   Incision (Closed) 03/31/17 Foot   03/31/17    1317     627   Incision (Closed) 04/01/17 Leg Right   04/01/17    0919     626          Intake/Output Last 24 hours No intake or output data in the 24 hours ending 12/18/18 0035  Labs/Imaging Results for orders placed or performed during the hospital encounter of 12/17/18 (from the past 48 hour(s))  Lactic acid, plasma     Status: Abnormal   Collection Time: 12/17/18  7:38 PM  Result Value Ref Range   Lactic Acid, Venous 3.4 (HH) 0.5 - 1.9 mmol/L    Comment: CRITICAL RESULT CALLED TO, READ BACK BY AND VERIFIED WITH BUTCH WOODS AT 2016 12/17/2018  TFK Performed at [facility], [address] [place],  [place] [address]   Comprehensive metabolic panel     Status: Abnormal   Collection Time: 12/17/18  7:38 PM  Result Value Ref Range   Sodium 137 135 - 145 mmol/L   Potassium 3.5 3.5 - 5.1 mmol/L   Chloride 101 98 - 111 mmol/L   CO2 25 22 - 32 mmol/L   Glucose, Bld 303 (H) 70 - 99 mg/dL   BUN 27 (H) 8 - 23 mg/dL   Creatinine, Ser 1.06 0.61 - 1.24 mg/dL   Calcium 9.2 8.9 - 10.3 mg/dL   Total Protein 7.6 6.5 - 8.1 g/dL   Albumin 4.3 3.5 - 5.0 g/dL   AST 22 15 - 41 U/L   ALT 18 0 - 44 U/L   Alkaline Phosphatase 85 38 - 126 U/L   Total Bilirubin 0.5 0.3 - 1.2 mg/dL   GFR calc non Af Amer >60 >60 mL/min   GFR calc Af Amer >60 >60 mL/min   Anion gap 11 5 - 15    Comment: Performed at [facility], [address] [place], [place] [address]  CBC with Differential     Status: None   Collection Time: 12/17/18  7:38 PM  Result Value Ref Range   WBC 9.0 4.0 - 10.5 K/uL   RBC 5.75 4.22 - 5.81 MIL/uL   Hemoglobin 15.5 13.0 - 17.0 g/dL   HCT 46.4 39.0 - 52.0 %   MCV 80.7 80.0 - 100.0 fL   MCH 27.0 26.0 - 34.0 pg   MCHC 33.4 30.0 - 36.0 g/dL   RDW 13.9 11.5 - 15.5 %   Platelets 231 150 - 400 K/uL   nRBC 0.0 0.0 - 0.2 %   Neutrophils Relative % 76 %   Neutro Abs 6.8 1.7 - 7.7 K/uL   Lymphocytes Relative 14 %   Lymphs Abs 1.3 0.7 - 4.0 K/uL   Monocytes Relative 8 %   Monocytes Absolute 0.8 0.1 - 1.0 K/uL   Eosinophils Relative 2 %   Eosinophils Absolute 0.2 0.0 - 0.5 K/uL   Basophils Relative 0 %   Basophils Absolute 0.0 0.0 - 0.1 K/uL   Immature Granulocytes 0 %   Abs Immature Granulocytes 0.03 0.00 - 0.07 K/uL    Comment: Performed at [facility], [address] [place], [place] [address]   Dg Foot 2 Views Left  Result Date: 12/17/2018 CLINICAL DATA:  Wound EXAM: LEFT FOOT - 2 VIEW COMPARISON:  None. FINDINGS: There is soft tissue swelling about the foot. There are findings suspicious for pockets of subcutaneous gas at the level of the hindfoot. There is no definite radiographic evidence for osteomyelitis. There is a small plantar calcaneal spur. There is no radiopaque foreign body. There is no acute displaced fracture or dislocation. IMPRESSION: 1. Soft tissue swelling with findings suspicious for subcutaneous gas at the level of the hindfoot. No definite radiographic evidence for osteomyelitis. Findings are concerning for a soft tissue infection. 2. No acute fracture or dislocation. Electronically Signed   By: Constance Holster M.D.   On: 12/17/2018 22:20    Pending Labs Unresulted Labs (From admission, onward)    Start     Ordered   12/17/18 2353  HIV Antibody (routine testing w rflx)  (HIV Antibody (Routine testing w reflex) panel)  Once,   STAT      12/18/18 0003   12/17/18 2255  SARS CORONAVIRUS 2 (TAT 6-24 HRS) Nasopharyngeal Nasopharyngeal Swab  (Asymptomatic/Tier 3)  Once,   STAT  Question Answer Comment  Is this test for diagnosis or screening Screening   Symptomatic for COVID-19 as defined by CDC No   Hospitalized for COVID-19 No   Admitted to ICU for COVID-19 No   Previously tested for COVID-19 Yes   Resident in a congregate (group) care setting No   Employed in healthcare setting No      12/17/18 2255   12/17/18 1933  Blood culture (routine x 2)  BLOOD CULTURE X 2,   STAT     12/17/18 1933          Vitals/Pain Today's Vitals   12/17/18 1936  BP: (!) 163/82  Pulse: 76  Resp: 18  Temp: 99.2 F (37.3 C)  TempSrc: Oral  SpO2: 100%    Isolation Precautions No active isolations  Medications Medications  vancomycin (VANCOCIN) IVPB 1000 mg/200 mL premix (1,000 mg Intravenous New Bag/Given 12/17/18 2340)  aspirin EC tablet 81 mg (has no administration in time range)  atenolol (TENORMIN) tablet 100 mg (has no administration in time range)  atorvastatin (LIPITOR) tablet 40 mg (has no administration in time range)  losartan (COZAAR) tablet 100 mg (has no administration in time range)  NIFEdipine (ADALAT CC) 24 hr tablet 120 mg (has no administration in time range)  spironolactone (ALDACTONE) tablet 25 mg (has no administration in time range)  glipiZIDE (GLUCOTROL) tablet 10 mg (has no administration in time range)  insulin detemir (LEVEMIR) FlexPen 34 Units (has no administration in time range)  polyethylene glycol (MIRALAX / GLYCOLAX) packet 17 g (has no administration in time range)  enoxaparin (LOVENOX) injection 40 mg (has no administration in time range)  0.9 %  sodium chloride infusion (has no administration in time range)  acetaminophen (TYLENOL) tablet 650 mg (has no administration in time range)    Or  acetaminophen (TYLENOL) suppository 650 mg (has no administration in time range)  ketorolac (TORADOL) 15  MG/ML injection 15 mg (has no administration in time range)  traZODone (DESYREL) tablet 25 mg (has no administration in time range)  magnesium hydroxide (MILK OF MAGNESIA) suspension 30 mL (has no administration in time range)  ondansetron (ZOFRAN) tablet 4 mg (has no administration in time range)    Or  ondansetron (ZOFRAN) injection 4 mg (has no administration in time range)  sodium chloride 0.9 % bolus 1,000 mL (1,000 mLs Intravenous New Bag/Given 12/17/18 2158)  Ampicillin-Sulbactam (UNASYN) 3 g in sodium chloride 0.9 % 100 mL IVPB (3 g Intravenous New Bag/Given 12/17/18 2340)    Mobility walks with device Low fall risk   Focused Assessments skin   R Recommendations: See Admitting Provider Note  Report given to:   Additional Notes:

## 2018-12-18 NOTE — Plan of Care (Signed)
Care plan reviewed with pt. Acknowledges meds and safety care. Pdowless,rn

## 2018-12-19 DIAGNOSIS — I10 Essential (primary) hypertension: Secondary | ICD-10-CM

## 2018-12-19 DIAGNOSIS — E1110 Type 2 diabetes mellitus with ketoacidosis without coma: Secondary | ICD-10-CM

## 2018-12-19 LAB — BASIC METABOLIC PANEL
Anion gap: 10 (ref 5–15)
BUN: 17 mg/dL (ref 8–23)
CO2: 27 mmol/L (ref 22–32)
Calcium: 8.8 mg/dL — ABNORMAL LOW (ref 8.9–10.3)
Chloride: 105 mmol/L (ref 98–111)
Creatinine, Ser: 1.08 mg/dL (ref 0.61–1.24)
GFR calc Af Amer: >60 mL/min (ref >60)
GFR calc non Af Amer: >60 mL/min (ref >60)
Glucose, Bld: 156 mg/dL — ABNORMAL HIGH (ref 70–99)
Potassium: 3.1 mmol/L — ABNORMAL LOW (ref 3.5–5.1)
Sodium: 142 mmol/L (ref 135–145)

## 2018-12-19 LAB — CBC
HCT: 41.6 % (ref 39.0–52.0)
Hemoglobin: 13.6 g/dL (ref 13.0–17.0)
MCH: 26.3 pg (ref 26.0–34.0)
MCHC: 32.7 g/dL (ref 30.0–36.0)
MCV: 80.5 fL (ref 80.0–100.0)
Platelets: 203 10*3/uL (ref 150–400)
RBC: 5.17 MIL/uL (ref 4.22–5.81)
RDW: 13.7 % (ref 11.5–15.5)
WBC: 6.4 10*3/uL (ref 4.0–10.5)
nRBC: 0 % (ref 0.0–0.2)

## 2018-12-19 LAB — GLUCOSE, CAPILLARY
Glucose-Capillary: 139 mg/dL — ABNORMAL HIGH (ref 70–99)
Glucose-Capillary: 244 mg/dL — ABNORMAL HIGH (ref 70–99)

## 2018-12-19 MED ORDER — AMOXICILLIN-POT CLAVULANATE 875-125 MG PO TABS: 1.0000 | ORAL_TABLET | Freq: Two times a day (BID) | ORAL | 0 refills | Status: DC

## 2018-12-19 MED ORDER — AMOXICILLIN-POT CLAVULANATE 875-125 MG PO TABS: 1.0000 | ORAL_TABLET | Freq: Two times a day (BID) | ORAL | Status: DC

## 2018-12-19 MED ORDER — FLUTICASONE PROPIONATE 50 MCG/ACT NA SUSP: 1.0000 | Freq: Every day | NASAL | 0 refills | Status: AC

## 2018-12-19 NOTE — Discharge Summary (Signed)
Trevor [name] [name] DOB: 1955-02-19 DOA: 12/17/2018  PCP: [name], MD  Admit date: 12/17/2018 Discharge date: 12/19/2018  Admitted From: Home Disposition: Home  Recommendations for Outpatient Follow-up:  1. Follow up with PCP in 1 week 2. Please obtain BMP/CBC in one week 3. Please follow up on the following pending results: Blood cultures  Home Health: Yes   Discharge Condition:Stable CODE STATUS: Full Diet recommendation: Carb Modified  Brief/Interim Summary: Trevor [name] is a 63 y.o. M with IDDM, HTN, OSA and R BKA who presented for left foot pain. Patient's left foot has had severe skin cracking for a while.  Recently told this was fungus, started on ketoconazole cream.  Now in the last few days, his foot is swelling, red and tender.  No fever, chills. In the ER, temp 99.77F, WBC normal, lactic acid 3.4.  X-ray showed no bony destruction but suggested gas in the hindfoot.  He was started on clindamycin and admitted.  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  Podiatry was consulted they felt that there was no real infection.Debrided multiple areas of the hyperkeratosis on the left foot sharply with a 15 blade and tissue nippers.  Patient should follow-up with dermatology for second opinion as his foot did not really look like fungal.  There was concern for possible early Charcot deformity showing up in the foot.  He has a significant risk eventually with previous amputation of the right lower extremity so he will need follow-up as outpatient and possible rex-ray.  Podiatry did not feel there is any signs of infection and think the soft tissue air on the x-ray was most likely from cracked callused areas.  I spoke with podiatry Dr. Caryl Comes this morning and he was okay discharging patient home off of IV antibiotics and just giving him 1 week dose of Augmentin.  Due to his lactic acidosis Metformin was held.  He will need to follow-up with his primary care for  further evaluation of his diabetes.  Also patient had ultrasound arterial ABI that revealed Left ABI is normal, measuring 1.35.   Discharge Diagnoses:  Active Problems:   Cellulitis of foot, left    Discharge Instructions  Discharge Instructions    Call MD for:  temperature >100.4   Complete by: As directed    Diet - low sodium heart healthy   Complete by: As directed    Discharge instructions   Complete by: As directed    Follow-up with Dr. Thresa Ross podiatry in 2 weeks Follow-up with dermatology [facility] in 1 week in [place]   Increase activity slowly   Complete by: As directed      Allergies as of 12/19/2018      Reactions   Enalapril Cough      Medication List    STOP taking these medications   metFORMIN 1000 MG tablet Commonly known as: GLUCOPHAGE     TAKE these medications   Accu-Chek Aviva Plus test strip Generic drug: glucose blood USE AS DIRECTED TO CHECK BLOOD SUGAR THREE TIMES A DAY   acetaminophen 325 MG tablet Commonly known as: TYLENOL Take 2 tablets (650 mg total) by mouth every 6 (six) hours as needed for mild pain (or Fever >/= 101).   ammonium lactate 12 % cream Commonly known as: Lac-Hydrin Apply topically as needed for dry skin.   amoxicillin-clavulanate 875-125 MG tablet Commonly known as: AUGMENTIN Take 1 tablet by mouth every 12 (twelve) hours.   aspirin EC  81 MG tablet Take 81 mg by mouth daily.   atenolol 100 MG tablet Commonly known as: TENORMIN Take 100 mg by mouth daily.   atorvastatin 40 MG tablet Commonly known as: LIPITOR Take 40 mg by mouth daily.   BD Insulin Syringe U/F 31G X 5/16" 1 ML Misc Generic drug: Insulin Syringe-Needle U-100   fluticasone 50 MCG/ACT nasal spray Commonly known as: FLONASE Place 1 spray into both nostrils daily. Start taking on: December 20, 2018   glipiZIDE 10 MG tablet Commonly known as: GLUCOTROL Take 10 mg by mouth 2 (two) times daily before a meal.    insulin aspart 100 UNIT/ML FlexPen Commonly known as: NOVOLOG Inject 10 Units into the skin 3 (three) times daily with meals.   insulin detemir 100 unit/ml Soln Commonly known as: LEVEMIR Inject 0.34 mLs (34 Units total) into the skin at bedtime.   losartan 100 MG tablet Commonly known as: COZAAR Take 100 mg by mouth daily.   NIFEdipine 30 MG 24 hr tablet Commonly known as: PROCARDIA-XL/NIFEDICAL-XL Take 120 mg by mouth daily.   polyethylene glycol 17 g packet Commonly known as: MIRALAX / GLYCOLAX Take 17 g by mouth daily as needed for mild constipation.   potassium chloride SA 20 MEQ tablet Commonly known as: Klor-Con M20 Take 1 tablet (20 mEq total) by mouth daily.   spironolactone 25 MG tablet Commonly known as: ALDACTONE Take 25 mg by mouth daily.   traMADol 50 MG tablet Commonly known as: ULTRAM Take 0.5-1 tablets (25-50 mg total) by mouth every 8 (eight) hours as needed for severe pain.      Follow-up Information    Jannet Mantis, MD. Schedule an appointment as soon as possible for a visit on 01/24/2019.   Specialty: Dermatology Why: Make an appointment for as soon as able; NOTE: Judson Roch will put name on List for Earlier Appt date;  @ 9:20 am Contact information: [facility] [address] [place] [address] [phone]        Call Gardiner Barefoot, DPM.   Specialty: Podiatry Why: Please make an appointment at the [facility] information: [address] [place] [address] [phone]        Felipa Furnace, DPM On 01/01/2019.   Specialty: Podiatry Why: at [facility] information: [address] [place] [address] [phone]        Please follow up.   Why: [place], [place], [place] Follow up in 2 week(s).   Specialty: Podiatry Contact information: [place] [address] [phone]           Allergies  Allergen Reactions  . Enalapril Cough    Consultations:  Podiatry   Procedures/Studies: US Arterial Abi (screening Lower Extremity)  Result Date: 12/18/2018 CLINICAL DATA:  Cellulitis.  History of right leg amputation. EXAM: NONINVASIVE PHYSIOLOGIC VASCULAR STUDY OF BILATERAL LOWER EXTREMITIES TECHNIQUE: Evaluation of left lower extremity was performed at rest, including calculation of ankle-brachial indices with single level Doppler. COMPARISON:  None. FINDINGS: Left ABI:  1.35 Left Lower Extremity:  Normal arterial waveforms at the ankle. 1.0-1.4 Normal IMPRESSION: Left ABI is normal, measuring 1.35. Electronically Signed   By: Markus Daft M.D.   On: 12/18/2018 17:53   Dg Foot 2 Views Left  Result Date: 12/17/2018 CLINICAL DATA:  Wound EXAM: LEFT FOOT - 2 VIEW COMPARISON:  None. FINDINGS:  There is soft tissue swelling about the foot. There are findings suspicious for pockets of subcutaneous gas at the level of the hindfoot. There is no definite radiographic evidence for osteomyelitis. There is a small plantar calcaneal spur. There is no radiopaque foreign body. There is no acute displaced fracture or dislocation. IMPRESSION: 1. Soft tissue swelling with findings suspicious for subcutaneous gas at the level of the hindfoot. No definite radiographic evidence for osteomyelitis. Findings are concerning for a soft tissue infection. 2. No acute fracture or dislocation. Electronically Signed   By: Constance Holster M.D.   On: 12/17/2018 22:20       Subjective: Has no complaints of pain.  Denies any chest pain, shortness of breath, foot pain.  Discharge Exam: Vitals:   12/18/18 2314 12/19/18 0816  BP: (!) 150/79 (!) 185/91  Pulse: (!) 58 61  Resp: 18 17  Temp: 98.5 F (36.9 C) 98 F (36.7 C)  SpO2: 97% 99%   Vitals:   12/18/18 0102 12/18/18 0733 12/18/18 2314 12/19/18 0816  BP: (!) 168/98 (!) 169/86 (!) 150/79 (!) 185/91  Pulse: 68 70 (!) 58 61  Resp: 19  18 17    Temp: 97.9 F (36.6 C)  98.5 F (36.9 C) 98 F (36.7 C)  TempSrc: Oral  Oral   SpO2: 98% 97% 97% 99%  Weight: (!) 143.7 kg     Height: 6\' 4"  (1.93 m)       General: Pt is alert, awake, not in acute distress Cardiovascular: RRR, S1/S2 +, no rubs, no gallops Respiratory: CTA bilaterally, no wheezing, no rhonchi Abdominal: Soft, NT, ND, bowel sounds + Extremities:Mild edema b/l. Rt BTK amputation, left foot sclanous    The results of significant diagnostics from this hospitalization (including imaging, microbiology, ancillary and laboratory) are listed below for reference.     Microbiology: Recent Results (from the past 240 hour(s))  Blood culture (routine x 2)     Status: None (Preliminary result)   Collection Time: 12/17/18  7:38 PM   Specimen: BLOOD  Result Value Ref Range Status   Specimen Description   Final    BLOOD BLOOD RIGHT HAND Performed at [facility], [address] [place], [place] [address]    Special Requests   Final    BOTTLES DRAWN AEROBIC AND ANAEROBIC Blood Culture results may not be optimal due to an excessive volume of blood received in culture bottles Performed at [facility], [address], [place], [place] [address]    Culture  Setup Time   Final    AEROBIC BOTTLE ONLY GRAM POSITIVE RODS CRITICAL RESULT CALLED TO, READ BACK BY AND VERIFIED WITH: CHARLES SHANTEVER AT [id number] ON 12/18/2018 JJB Performed at [facility], [address], [place], [place] [address]    Culture   Final    Lonell Grandchild POSITIVE RODS CULTURE REINCUBATED FOR BETTER GROWTH Performed at [facility], [place] [address] [place], [place] [address]    Report Status PENDING  Incomplete  Blood culture (routine x 2)     Status: None (Preliminary result)   Collection Time: 12/17/18 11:31 PM   Specimen: BLOOD  Result Value Ref Range Status   Specimen Description BLOOD RIGHT ANTECUBITAL  Final   Special Requests   Final    BOTTLES DRAWN AEROBIC AND  ANAEROBIC Blood Culture adequate volume   Culture   Final    NO GROWTH 2 DAYS Performed at [facility], [address], [place], [place] [address]  Report Status PENDING  Incomplete  SARS CORONAVIRUS 2 (TAT 6-24 HRS) Nasopharyngeal Nasopharyngeal Swab     Status: None   Collection Time: 12/17/18 11:31 PM   Specimen: Nasopharyngeal Swab  Result Value Ref Range Status   SARS Coronavirus 2 NEGATIVE NEGATIVE Final    Comment: (NOTE) SARS-CoV-2 target nucleic acids are NOT DETECTED. The SARS-CoV-2 RNA is generally detectable in upper and lower respiratory specimens during the acute phase of infection. Negative results do not preclude SARS-CoV-2 infection, do not rule out co-infections with other pathogens, and should not be used as the sole basis for treatment or other patient management decisions. Negative results must be combined with clinical observations, patient history, and epidemiological information. The expected result is Negative. Fact Sheet for Patients: SugarRoll.be Fact Sheet for Healthcare Providers: https://www.woods-mathews.com/ This test is not yet approved or cleared by the [place] FDA and  has been authorized for detection and/or diagnosis of SARS-CoV-2 by FDA under an Emergency Use Authorization (EUA). This EUA will remain  in effect (meaning this test can be used) for the duration of the COVID-19 declaration under Section 56 4(b)(1) of the Act, 21 U.S.C. section 360bbb-3(b)(1), unless the authorization is terminated or revoked sooner. Performed at [facility], [place] [address] [place], [place] [address]      Labs: BNP (last 3 results) No results for input(s): BNP in the last 8760 hours. Basic Metabolic Panel: Recent Labs  Lab 12/17/18 1938 12/19/18 0417  NA 137 142  K 3.5 3.1*  CL 101 105  CO2 25 27  GLUCOSE 303* 156*  BUN 27* 17  CREATININE 1.06 1.08  CALCIUM 9.2 8.8*   Liver  Function Tests: Recent Labs  Lab 12/17/18 1938  AST 22  ALT 18  ALKPHOS 85  BILITOT 0.5  PROT 7.6  ALBUMIN 4.3   No results for input(s): LIPASE, AMYLASE in the last 168 hours. No results for input(s): AMMONIA in the last 168 hours. CBC: Recent Labs  Lab 12/17/18 1938 12/19/18 0417  WBC 9.0 6.4  NEUTROABS 6.8  --   HGB 15.5 13.6  HCT 46.4 41.6  MCV 80.7 80.5  PLT 231 203   Cardiac Enzymes: No results for input(s): CKTOTAL, CKMB, CKMBINDEX, TROPONINI in the last 168 hours. BNP: Invalid input(s): POCBNP CBG: Recent Labs  Lab 12/18/18 1236 12/18/18 1626 12/18/18 2100 12/19/18 0817 12/19/18 1140  GLUCAP 222* 264* 198* 139* 244*   D-Dimer No results for input(s): DDIMER in the last 72 hours. Hgb A1c Recent Labs    12/18/18 0510  HGBA1C 9.6*   Lipid Profile No results for input(s): CHOL, HDL, LDLCALC, TRIG, CHOLHDL, LDLDIRECT in the last 72 hours. Thyroid function studies No results for input(s): TSH, T4TOTAL, T3FREE, THYROIDAB in the last 72 hours.  Invalid input(s): FREET3 Anemia work up No results for input(s): VITAMINB12, FOLATE, FERRITIN, TIBC, IRON, RETICCTPCT in the last 72 hours. Urinalysis    Component Value Date/Time   COLORURINE STRAW (A) 04/08/2017 1447   APPEARANCEUR CLEAR 04/08/2017 1447   LABSPEC 1.006 04/08/2017 1447   PHURINE 7.0 04/08/2017 1447   GLUCOSEU 50 (A) 04/08/2017 1447   HGBUR MODERATE (A) 04/08/2017 1447   BILIRUBINUR NEGATIVE 04/08/2017 1447   KETONESUR NEGATIVE 04/08/2017 1447   PROTEINUR NEGATIVE 04/08/2017 1447   NITRITE NEGATIVE 04/08/2017 1447   LEUKOCYTESUR NEGATIVE 04/08/2017 1447   Sepsis Labs Invalid input(s): PROCALCITONIN,  WBC,  LACTICIDVEN Microbiology Recent Results (from the past 240 hour(s))  Blood culture (routine x 2)  Status: None (Preliminary result)   Collection Time: 12/17/18  7:38 PM   Specimen: BLOOD  Result Value Ref Range Status   Specimen Description   Final    BLOOD BLOOD RIGHT  HAND Performed at [facility], [address] [place], [place] [address]    Special Requests   Final    BOTTLES DRAWN AEROBIC AND ANAEROBIC Blood Culture results may not be optimal due to an excessive volume of blood received in culture bottles Performed at [facility], [address] [place], [place] [address]    Culture  Setup Time   Final    AEROBIC BOTTLE ONLY GRAM POSITIVE RODS CRITICAL RESULT CALLED TO, READ BACK BY AND VERIFIED WITH: CHARLES SHANTEVER AT 1411 ON 12/18/2018 JJB Performed at [facility], [address], [place], [place] [address]    Culture   Final    Lonell Grandchild POSITIVE RODS CULTURE REINCUBATED FOR BETTER GROWTH Performed at [facility], [place] [address], [place], [place] [address]    Report Status PENDING  Incomplete  Blood culture (routine x 2)     Status: None (Preliminary result)   Collection Time: 12/17/18 11:31 PM   Specimen: BLOOD  Result Value Ref Range Status   Specimen Description BLOOD RIGHT ANTECUBITAL  Final   Special Requests   Final    BOTTLES DRAWN AEROBIC AND ANAEROBIC Blood Culture adequate volume   Culture   Final    NO GROWTH 2 DAYS Performed at [facility], [address] [place], [place] [address]    Report Status PENDING  Incomplete  SARS CORONAVIRUS 2 (TAT 6-24 HRS) Nasopharyngeal Nasopharyngeal Swab     Status: None   Collection Time: 12/17/18 11:31 PM   Specimen: Nasopharyngeal Swab  Result Value Ref Range Status   SARS Coronavirus 2 NEGATIVE NEGATIVE Final    Comment: (NOTE) SARS-CoV-2 target nucleic acids are NOT DETECTED. The SARS-CoV-2 RNA is generally detectable in upper and lower respiratory specimens during the acute phase of infection. Negative results do not preclude SARS-CoV-2 infection, do not rule out co-infections with other pathogens, and should not be used as the sole basis for treatment or other patient management decisions. Negative results must be combined with  clinical observations, patient history, and epidemiological information. The expected result is Negative. Fact Sheet for Patients: SugarRoll.be Fact Sheet for Healthcare Providers: https://www.woods-mathews.com/ This test is not yet approved or cleared by the [place] FDA and  has been authorized for detection and/or diagnosis of SARS-CoV-2 by FDA under an Emergency Use Authorization (EUA). This EUA will remain  in effect (meaning this test can be used) for the duration of the COVID-19 declaration under Section 56 4(b)(1) of the Act, 21 U.S.C. section 360bbb-3(b)(1), unless the authorization is terminated or revoked sooner. Performed at [facility], [place] [address], [place], [place] [address]      Time coordinating discharge: Over 30 minutes  SIGNED:   Nolberto Hanlon, MD  [facility] 12/19/2018, 1:00 PM Pager   If 7PM-7AM, please contact night-coverage [email or web address] Password [email or web address]

## 2018-12-19 NOTE — TOC Transition Note (Signed)
Transition of Care [facility]) - CM/SW Discharge Note   Patient Details  Name: THADDUES PETRILLI MRN: [id number] Date of Birth: 09-21-1955  Transition of Care [facility]) CM/SW Contact:  Victorino Dike, RN Phone Number: 12/19/2018, 10:26 AM   Clinical Narrative:    Patient to discharge home with [facility] with [facility], OT, Nursing.  Will [facility] with [facility] services.  Has [place] at home, no additional services needed.   Final next level of care: [place] Barriers to Discharge: Barriers Resolved   Patient Goals and CMS Choice Patient states their goals for this hospitalization and ongoing recovery are:: Patient states his goal is to go home with home health.      Discharge Placement                       Discharge Plan and Services                          HH Arranged: RN [facility] Agency: [place] ([facility]) Date [place]: 12/18/18 Time [place]: 1453 Representative spoke with at [place]: Floydene Flock at [place] ([facility]) Interventions     Readmission Risk Interventions No flowsheet data found.

## 2018-12-20 ENCOUNTER — Ambulatory Visit: Payer: Medicare Other

## 2018-12-20 LAB — CULTURE, BLOOD (ROUTINE X 2)

## 2018-12-22 LAB — CULTURE, BLOOD (ROUTINE X 2)
Culture: NO GROWTH
Special Requests: ADEQUATE

## 2018-12-25 ENCOUNTER — Other Ambulatory Visit: Payer: Self-pay

## 2018-12-25 ENCOUNTER — Ambulatory Visit (INDEPENDENT_AMBULATORY_CARE_PROVIDER_SITE_OTHER): Payer: Medicare Other | Admitting: Orthopedic Surgery

## 2018-12-25 ENCOUNTER — Ambulatory Visit: Payer: Medicare Other | Admitting: Orthopedic Surgery

## 2018-12-25 ENCOUNTER — Encounter: Payer: Self-pay | Admitting: Orthopedic Surgery

## 2018-12-25 VITALS — Ht 76.0 in | Wt 316.8 lb

## 2018-12-25 DIAGNOSIS — B353 Tinea pedis: Secondary | ICD-10-CM

## 2018-12-26 ENCOUNTER — Encounter: Payer: Self-pay | Admitting: Orthopedic Surgery

## 2018-12-26 DIAGNOSIS — R7989 Other specified abnormal findings of blood chemistry: Secondary | ICD-10-CM

## 2018-12-26 NOTE — Progress Notes (Signed)
Office Visit Note   Patient: Trevor [name]           Date of Birth: Nov 30, 1955           MRN: [id number] Visit Date: 12/25/2018              Requested by: [name], MD [place],  [place] [address] PCP: [name], MD  Chief Complaint  Patient presents with  . Left Foot - Follow-up      HPI: Patient is a 63 year old gentleman who is seen for initial evaluation for fungal rash left foot.  Patient currently ambulates with a cane he was seen at the emergency department and was placed on oral and IV antibiotics.  Radiographs were also obtained.  Patient states that the hard scaly skin is been present for about 2 to 3 weeks.  He states he has most of the pain where the open cracks are in the dry skin.  Assessment & Plan: Visit Diagnoses:  1. Dermatophytosis of left foot     Plan: Recommended patient obtain knee-high medical compression stockings wear these around-the-clock and reevaluate in 4 weeks.  Patient has no signs or symptoms of infection.  Follow-Up Instructions: Return in about 4 weeks (around 01/22/2019).   Ortho Exam  Patient is alert, oriented, no adenopathy, well-dressed, normal affect, normal respiratory effort. Examination patient has a triphasic posterior tibial pulse he has dermatitis with dry cracked skin that involves the midfoot hindfoot and forefoot.  There are cracks over the arch and this is the area that is painful there is no exposed bone or tendon no abscess no cellulitis no signs of infection.  Imaging: No results found.   Labs: Lab Results  Component Value Date   HGBA1C 9.6 (H) 12/18/2018   HGBA1C 6.4 (A) 06/26/2017   HGBA1C 12.5 03/29/2017   REPTSTATUS 12/22/2018 FINAL 12/17/2018   GRAMSTAIN  03/31/2017    ABUNDANT WBC PRESENT, PREDOMINANTLY PMN ABUNDANT GRAM POSITIVE COCCI MODERATE GRAM NEGATIVE RODS Performed at [facility], [place] [address] [place], [place] [address]    CULT  12/17/2018    NO GROWTH  5 DAYS Performed at [facility], [address] [place], [place] [address]    [place] 03/31/2017     Lab Results  Component Value Date   ALBUMIN 4.3 12/17/2018   ALBUMIN 2.3 (L) 04/13/2017   ALBUMIN 2.4 (L) 04/11/2017    Lab Results  Component Value Date   MG 1.7 04/24/2017   MG 1.6 (L) 04/12/2017   MG 1.7 04/01/2017   No results found for: VD25OH  No results found for: PREALBUMIN CBC EXTENDED Latest Ref Rng & Units 12/19/2018 12/17/2018 10/25/2018  WBC 4.0 - 10.5 K/uL 6.4 9.0 8.0  RBC 4.22 - 5.81 MIL/uL 5.17 5.75 5.49  HGB 13.0 - 17.0 g/dL 13.6 15.5 14.6  HCT 39.0 - 52.0 % 41.6 46.4 43.9  PLT 150 - 400 K/uL 203 231 251  NEUTROABS 1.7 - 7.7 K/uL - 6.8 -  LYMPHSABS 0.7 - 4.0 K/uL - 1.3 -     Body mass index is 38.56 kg/m.  Orders:  No orders of the defined types were placed in this encounter.  No orders of the defined types were placed in this encounter.    Procedures: No procedures performed  Clinical Data: No additional findings.  ROS:  All other systems negative, except as noted in the HPI. Review of Systems  Objective: Vital Signs:  Ht 6\' 4"  (1.93 m)   Wt (!) 316 lb 12.8 oz (143.7 kg)   BMI 38.56 kg/m   Specialty Comments:  No specialty comments available.  PMFS History: Patient Active Problem List   Diagnosis Date Noted  . Diabetic ketoacidosis without coma associated with type 2 diabetes mellitus ([place])   . Cellulitis of left foot 12/17/2018  . Pain due to onychomycosis of toenail of right foot 07/05/2018  . Abnormality of gait 10/12/2017  . Poorly controlled type 2 diabetes mellitus with peripheral neuropathy ([place])   . Flatulence   . Hypomagnesemia   . Unilateral complete BKA, right, sequela ([place])   . Benign essential HTN   . Hypoalbuminemia due to protein-calorie malnutrition ([place])   . S/P below knee amputation, right ([place]) 04/12/2017  . Acute blood loss anemia   . Other encephalopathy 04/08/2017  .  Encephalopathy 04/08/2017  . Altered mental status 04/08/2017  . Labile blood pressure   . Labile blood glucose   . Drug induced constipation   . Stage 3 chronic kidney disease   . Bacteremia   . S/P unilateral BKA (below knee amputation), right ([place]) 04/04/2017  . PAD (peripheral artery disease) ([place])   . Type 2 diabetes mellitus with right diabetic foot ulcer ([place])   . Post-operative pain   . Legally blind   . Upper GI bleed   . Streptococcal bacteremia 04/01/2017  . Hypokalemia 03/30/2017  . Uncontrolled type 2 diabetes mellitus with hyperglycemia, with long-term current use of insulin ([place]) 03/30/2017  . Type 2 diabetes mellitus with peripheral neuropathy ([place]) 03/30/2017  . AKI (acute kidney injury) ([place]) 03/30/2017  . CKD stage 3 due to type 2 diabetes mellitus ([place]) 03/30/2017  . Sepsis ([place]) 03/30/2017  . Heart murmur 11/22/2016  . Hyperlipidemia associated with type 2 diabetes mellitus ([place]) 11/22/2016  . Obstructive sleep apnea syndrome 11/22/2016  . Essential hypertension 12/17/2012   Past Medical History:  Diagnosis Date  . Diabetes mellitus without complication ([place])   . Heart murmur   . Hyperlipidemia   . Hypertension   . Sleep apnea     History reviewed. No pertinent family history.  Past Surgical History:  Procedure Laterality Date  . AMPUTATION Right 03/31/2017   Procedure: RIGHT FOOT 1ST AND 2ND RAY AMPUTATION;  Surgeon: Newt Minion, MD;  Location: [facility];  Service: Orthopedics;  Laterality: Right;  . AMPUTATION Right 04/01/2017   Procedure: AMPUTATION BELOW KNEE;  Surgeon: Newt Minion, MD;  Location: [facility];  Service: Orthopedics;  Laterality: Right;  . COLONOSCOPY WITH PROPOFOL N/A 10/02/2017   Procedure: COLONOSCOPY WITH PROPOFOL;  Surgeon: [name], MD;  Location: [facility] ENDOSCOPY;  Service: Gastroenterology;  Laterality: N/A;  . CORNEAL TRANSPLANT     Social History   Occupational History  . Not on file  Tobacco Use  . Smoking status: Never  Smoker  . Smokeless tobacco: Never Used  Substance and Sexual Activity  . Alcohol use: No  . Drug use: Never  . Sexual activity: Not on file

## 2018-12-27 ENCOUNTER — Ambulatory Visit: Payer: Medicare Other

## 2018-12-27 ENCOUNTER — Ambulatory Visit: Payer: Medicare Other | Admitting: Podiatry

## 2018-12-28 ENCOUNTER — Emergency Department
Admission: EM | Admit: 2018-12-28 | Discharge: 2018-12-28 | Disposition: A | Discharge status: Home / Self Care | Payer: Medicare Other | Attending: Emergency Medicine | Admitting: Emergency Medicine

## 2018-12-28 ENCOUNTER — Emergency Department: Payer: Medicare Other

## 2018-12-28 ENCOUNTER — Encounter: Payer: Self-pay | Admitting: Emergency Medicine

## 2018-12-28 ENCOUNTER — Other Ambulatory Visit: Payer: Self-pay

## 2018-12-28 DIAGNOSIS — U071 COVID-19: Secondary | ICD-10-CM | POA: Diagnosis not present

## 2018-12-28 DIAGNOSIS — Z794 Long term (current) use of insulin: Secondary | ICD-10-CM | POA: Diagnosis not present

## 2018-12-28 DIAGNOSIS — R05 Cough: Secondary | ICD-10-CM | POA: Diagnosis present

## 2018-12-28 DIAGNOSIS — J189 Pneumonia, unspecified organism: Secondary | ICD-10-CM | POA: Diagnosis not present

## 2018-12-28 DIAGNOSIS — I129 Hypertensive chronic kidney disease with stage 1 through stage 4 chronic kidney disease, or unspecified chronic kidney disease: Secondary | ICD-10-CM | POA: Insufficient documentation

## 2018-12-28 DIAGNOSIS — N183 Chronic kidney disease, stage 3 unspecified: Secondary | ICD-10-CM | POA: Diagnosis not present

## 2018-12-28 DIAGNOSIS — E1122 Type 2 diabetes mellitus with diabetic chronic kidney disease: Secondary | ICD-10-CM | POA: Diagnosis not present

## 2018-12-28 DIAGNOSIS — Z79899 Other long term (current) drug therapy: Secondary | ICD-10-CM | POA: Diagnosis not present

## 2018-12-28 LAB — BASIC METABOLIC PANEL
Anion gap: 12 (ref 5–15)
BUN: 25 mg/dL — ABNORMAL HIGH (ref 8–23)
CO2: 21 mmol/L — ABNORMAL LOW (ref 22–32)
Calcium: 8.8 mg/dL — ABNORMAL LOW (ref 8.9–10.3)
Chloride: 104 mmol/L (ref 98–111)
Creatinine, Ser: 1.45 mg/dL — ABNORMAL HIGH (ref 0.61–1.24)
GFR calc Af Amer: 59 mL/min — ABNORMAL LOW (ref >60)
GFR calc non Af Amer: 51 mL/min — ABNORMAL LOW (ref >60)
Glucose, Bld: 186 mg/dL — ABNORMAL HIGH (ref 70–99)
Potassium: 2.9 mmol/L — ABNORMAL LOW (ref 3.5–5.1)
Sodium: 137 mmol/L (ref 135–145)

## 2018-12-28 LAB — CBC WITH DIFFERENTIAL/PLATELET
Abs Immature Granulocytes: 0 10*3/uL (ref 0.00–0.07)
Basophils Absolute: 0 10*3/uL (ref 0.0–0.1)
Basophils Relative: 0 %
Eosinophils Absolute: 0 10*3/uL (ref 0.0–0.5)
Eosinophils Relative: 0 %
HCT: 39.6 % (ref 39.0–52.0)
Hemoglobin: 13.5 g/dL (ref 13.0–17.0)
Immature Granulocytes: 0 %
Lymphocytes Relative: 16 %
Lymphs Abs: 0.8 10*3/uL (ref 0.7–4.0)
MCH: 27.2 pg (ref 26.0–34.0)
MCHC: 34.1 g/dL (ref 30.0–36.0)
MCV: 79.8 fL — ABNORMAL LOW (ref 80.0–100.0)
Monocytes Absolute: 0.4 10*3/uL (ref 0.1–1.0)
Monocytes Relative: 8 %
Neutro Abs: 4 10*3/uL (ref 1.7–7.7)
Neutrophils Relative %: 76 %
Platelets: 201 10*3/uL (ref 150–400)
RBC: 4.96 MIL/uL (ref 4.22–5.81)
RDW: 13.5 % (ref 11.5–15.5)
WBC: 5.3 10*3/uL (ref 4.0–10.5)
nRBC: 0 % (ref 0.0–0.2)

## 2018-12-28 LAB — POC SARS CORONAVIRUS 2 AG: SARS Coronavirus 2 Ag: POSITIVE — AB

## 2018-12-28 MED ORDER — POTASSIUM CHLORIDE ER 20 MEQ PO TBCR: 40.0000 meq | EXTENDED_RELEASE_TABLET | Freq: Two times a day (BID) | ORAL | 0 refills | Status: DC

## 2018-12-28 MED ORDER — AZITHROMYCIN 500 MG PO TABS
500.0000 mg | ORAL_TABLET | Freq: Once | ORAL | Status: AC
  Administered 2018-12-28: 500 mg via ORAL
  Filled 2018-12-28: qty 1

## 2018-12-28 MED ORDER — SODIUM CHLORIDE 0.9 % IV BOLUS
1000.0000 mL | Freq: Once | INTRAVENOUS | Status: AC
  Administered 2018-12-28: 1000 mL via INTRAVENOUS

## 2018-12-28 MED ORDER — AZITHROMYCIN 250 MG PO TABS: ORAL_TABLET | ORAL | 0 refills | Status: DC

## 2018-12-28 MED ORDER — POTASSIUM CHLORIDE CRYS ER 20 MEQ PO TBCR
30.0000 meq | EXTENDED_RELEASE_TABLET | Freq: Once | ORAL | Status: AC
  Administered 2018-12-28: 30 meq via ORAL
  Filled 2018-12-28: qty 2

## 2018-12-28 MED ORDER — ALBUTEROL SULFATE HFA 108 (90 BASE) MCG/ACT IN AERS: 2.0000 | INHALATION_SPRAY | Freq: Four times a day (QID) | RESPIRATORY_TRACT | 0 refills | Status: DC | PRN

## 2018-12-28 NOTE — ED Triage Notes (Signed)
Pt c/o yellow productive cough X 3 days and started with low grade temp today.  Temp 99.9 in triage.  Unlabored. Has not had any tylenol or motrin today. No known covid exposure.  Denies both [facility] and chest pain.  Only sx are cough and low grade fever.  VSS.  NAD.

## 2018-12-28 NOTE — ED Provider Notes (Signed)
[facility] EMERGENCY DEPARTMENT Provider Note   CSN: [id number] Arrival date & time: 12/28/18  1538     History Chief Complaint  Patient presents with  . Cough    Trevor [name] is a 63 y.o. male presents to the emergency department for evaluation of cough x3 days.  Today noticed a fever of 100.5.  Has had some chills.  No body aches, chest pain, shortness of breath, nausea or vomiting or abdominal pain.  No known exposure to Covid.  He has not take any medications for symptoms.  HPI     Past Medical History:  Diagnosis Date  . Diabetes mellitus without complication ([place])   . Heart murmur   . Hyperlipidemia   . Hypertension   . Sleep apnea     Patient Active Problem List   Diagnosis Date Noted  . Elevated lactic acid level   . Diabetic ketoacidosis without coma associated with type 2 diabetes mellitus ([place])   . Cellulitis of left foot 12/17/2018  . Pain due to onychomycosis of toenail of right foot 07/05/2018  . Abnormality of gait 10/12/2017  . Type 2 diabetes mellitus with hyperglycemia, with long-term current use of insulin ([place])   . Flatulence   . Hypomagnesemia   . Unilateral complete BKA, right, sequela ([place])   . Benign essential HTN   . Hypoalbuminemia due to protein-calorie malnutrition ([place])   . S/P below knee amputation, right ([place]) 04/12/2017  . Acute blood loss anemia   . Other encephalopathy 04/08/2017  . Encephalopathy 04/08/2017  . Altered mental status 04/08/2017  . Labile blood pressure   . Labile blood glucose   . Drug induced constipation   . Stage 3 chronic kidney disease   . Bacteremia   . S/P unilateral BKA (below knee amputation), right ([place]) 04/04/2017  . PAD (peripheral artery disease) ([place])   . Type 2 diabetes mellitus with right diabetic foot ulcer ([place])   . Post-operative pain   . Legally blind   . Upper GI bleed   . Streptococcal bacteremia 04/01/2017  . Hypokalemia 03/30/2017  . Uncontrolled type 2  diabetes mellitus with hyperglycemia, with long-term current use of insulin ([place]) 03/30/2017  . Type 2 diabetes mellitus with peripheral neuropathy ([place]) 03/30/2017  . AKI (acute kidney injury) ([place]) 03/30/2017  . CKD stage 3 due to type 2 diabetes mellitus ([place]) 03/30/2017  . Sepsis ([place]) 03/30/2017  . Heart murmur 11/22/2016  . Hyperlipidemia associated with type 2 diabetes mellitus ([place]) 11/22/2016  . Obstructive sleep apnea syndrome 11/22/2016  . Essential hypertension 12/17/2012    Past Surgical History:  Procedure Laterality Date  . AMPUTATION Right 03/31/2017   Procedure: RIGHT FOOT 1ST AND 2ND RAY AMPUTATION;  Surgeon: Newt Minion, MD;  Location: [facility];  Service: Orthopedics;  Laterality: Right;  . AMPUTATION Right 04/01/2017   Procedure: AMPUTATION BELOW KNEE;  Surgeon: Newt Minion, MD;  Location: [facility];  Service: Orthopedics;  Laterality: Right;  . COLONOSCOPY WITH PROPOFOL N/A 10/02/2017   Procedure: COLONOSCOPY WITH PROPOFOL;  Surgeon: [name], MD;  Location: [facility] ENDOSCOPY;  Service: Gastroenterology;  Laterality: N/A;  . CORNEAL TRANSPLANT         History reviewed. No pertinent family history.  Social History   Tobacco Use  . Smoking status: Never Smoker  . Smokeless tobacco: Never Used  Substance Use Topics  . Alcohol use: No  . Drug use: Never    Home Medications Prior to Admission medications  Medication Sig Start Date End Date Taking? Authorizing Provider  ACCU-CHEK AVIVA PLUS test strip USE AS DIRECTED TO CHECK BLOOD SUGAR THREE TIMES A DAY 06/11/18   [provider]  acetaminophen (TYLENOL) 325 MG tablet Take 2 tablets (650 mg total) by mouth every 6 (six) hours as needed for mild pain (or Fever >/= 101). 04/12/17   Katherine Roan, MD  albuterol (VENTOLIN HFA) 108 (90 Base) MCG/ACT inhaler Inhale 2 puffs into the lungs every 6 (six) hours as needed for wheezing or shortness of breath. 12/28/18   Duanne Guess, PA-C  ammonium lactate  (LAC-HYDRIN) 12 % cream Apply topically as needed for dry skin. 02/05/18   Gardiner Barefoot, DPM  amoxicillin-clavulanate (AUGMENTIN) 875-125 MG tablet Take 1 tablet by mouth every 12 (twelve) hours. 12/19/18   Nolberto Hanlon, MD  aspirin EC 81 MG tablet Take 81 mg by mouth daily.    [provider]  atenolol (TENORMIN) 100 MG tablet Take 100 mg by mouth daily.     [provider]  atorvastatin (LIPITOR) 40 MG tablet Take 40 mg by mouth daily. 11/10/18   [provider]  azithromycin (ZITHROMAX Z-PAK) 250 MG tablet Take 2 tablets (500 mg) on  Day 1,  followed by 1 tablet (250 mg) once daily on Days 2 through 5. 12/28/18   Duanne Guess, PA-C  BD INSULIN SYRINGE U/F 31G X 5/16" 1 ML MISC  07/23/18   [provider]  fluticasone (FLONASE) 50 MCG/ACT nasal spray Place 1 spray into both nostrils daily. 12/20/18   Nolberto Hanlon, MD  glipiZIDE (GLUCOTROL) 10 MG tablet Take 10 mg by mouth 2 (two) times daily before a meal.    [provider]  insulin aspart (NOVOLOG) 100 UNIT/ML FlexPen Inject 10 Units into the skin 3 (three) times daily with meals. 04/27/17   Angiulli, Lavon Paganini, PA-C  insulin detemir (LEVEMIR) 100 unit/ml SOLN Inject 0.34 mLs (34 Units total) into the skin at bedtime. 04/27/17   Angiulli, Lavon Paganini, PA-C  losartan (COZAAR) 100 MG tablet Take 100 mg by mouth daily.    [provider]  NIFEdipine (PROCARDIA-XL/NIFEDICAL-XL) 30 MG 24 hr tablet Take 120 mg by mouth daily.    [provider]  polyethylene glycol (MIRALAX / GLYCOLAX) packet Take 17 g by mouth daily as needed for mild constipation. 04/04/17   Colbert Ewing, MD  potassium chloride 20 MEQ TBCR Take 40 mEq by mouth 2 (two) times daily for 3 days. 12/28/18 12/31/18  Duanne Guess, PA-C  potassium chloride SA (KLOR-CON M20) 20 MEQ tablet Take 1 tablet (20 mEq total) by mouth daily. 10/25/18   Hinda Kehr, MD  spironolactone (ALDACTONE) 25 MG tablet Take 25 mg by mouth daily.     [provider]  traMADol (ULTRAM) 50 MG tablet Take 0.5-1 tablets (25-50 mg total) by mouth every 8 (eight) hours as needed for severe pain. 04/27/17   Angiulli, Lavon Paganini, PA-C    Allergies    Enalapril  Review of Systems   Review of Systems  Constitutional: Positive for chills and fever. Negative for fatigue.  Respiratory: Positive for cough. Negative for choking and shortness of breath.   Cardiovascular: Negative for chest pain.  Gastrointestinal: Negative for diarrhea, nausea and vomiting.  Musculoskeletal: Positive for myalgias.  Skin: Negative for rash and wound.  Neurological: Negative for dizziness, light-headedness and headaches.    Physical Exam Updated Vital Signs BP 131/66   Pulse 77   Temp 99.9 F (  37.7 C) (Oral)   Resp 18   Ht 6\' 4"  (1.93 m)   Wt (!) 142.9 kg   SpO2 93%   BMI 38.34 kg/m   Physical Exam Vitals reviewed.  Constitutional:      Appearance: Normal appearance. He is well-developed.  HENT:     Head: Normocephalic and atraumatic.  Eyes:     Conjunctiva/sclera: Conjunctivae normal.  Cardiovascular:     Rate and Rhythm: Normal rate.  Pulmonary:     Effort: Pulmonary effort is normal. No respiratory distress.     Breath sounds: Normal breath sounds. No stridor. No wheezing, rhonchi or rales.  Abdominal:     General: There is no distension.     Palpations: Abdomen is soft.     Tenderness: There is no abdominal tenderness. There is no right CVA tenderness, left CVA tenderness or guarding.  Musculoskeletal:        General: Normal range of motion.     Cervical back: Normal range of motion.  Skin:    General: Skin is warm.     Findings: No rash.  Neurological:     Mental Status: He is alert and oriented to person, place, and time.  Psychiatric:        Behavior: Behavior normal.        Thought Content: Thought content normal.     ED Results / Procedures / Treatments   Labs (all labs ordered are listed, but only abnormal results  are displayed) Labs Reviewed  CBC WITH DIFFERENTIAL/PLATELET - Abnormal; Notable for the following components:      Result Value   MCV 79.8 (*)    All other components within normal limits  BASIC METABOLIC PANEL - Abnormal; Notable for the following components:   Potassium 2.9 (*)    CO2 21 (*)    Glucose, Bld 186 (*)    BUN 25 (*)    Creatinine, Ser 1.45 (*)    Calcium 8.8 (*)    GFR calc non Af Amer 51 (*)    GFR calc Af Amer 59 (*)    All other components within normal limits  POC SARS CORONAVIRUS 2 AG - Abnormal; Notable for the following components:   SARS Coronavirus 2 Ag POSITIVE (*)    All other components within normal limits  POC SARS CORONAVIRUS 2 AG -  ED    EKG None  Radiology DG Chest 2 View  Result Date: 12/28/2018 CLINICAL DATA:  Cough and fever. EXAM: CHEST - 2 VIEW COMPARISON:  Chest radiographs 04/08/2017 and chest CTA 04/09/2017 FINDINGS: The cardiac silhouette is mildly enlarged. Right PICC has been removed. There are new patchy opacities throughout both lungs. No pleural effusion or pneumothorax is identified. No acute osseous abnormality is seen. IMPRESSION: New patchy bilateral lung opacities compatible with pneumonia. Electronically Signed   By: Logan Bores M.D.   On: 12/28/2018 17:14    Procedures Procedures (including critical care time)  Medications Ordered in ED Medications  azithromycin (ZITHROMAX) tablet 500 mg (has no administration in time range)  sodium chloride 0.9 % bolus 1,000 mL (1,000 mLs Intravenous New Bag/Given 12/28/18 1832)  potassium chloride SA (KLOR-CON) CR tablet 30 mEq (30 mEq Oral Given 12/28/18 1832)    ED Course  I have reviewed the triage vital signs and the nursing notes.  Pertinent labs & imaging results that were available during my care of the patient were reviewed by me and considered in my medical decision making (see chart for details).  MDM Rules/Calculators/A&P                      63 year old male  presents with cough and fever.  Covid test is positive.  Chest x-ray showed patchy infiltrates consistent with pneumonia.  Patient's vital signs are stable.  O2 saturations between 93 and 95% at rest.  With ambulation of 200 feet on room air patient's oxygen saturations held mostly between 93 and 95% occasionally dropping to 90 to 91%.  Patient did not feel short of breath with ambulating.  No chest pain.  Patient is started on azithromycin, given albuterol inhaler, strict return precautions.  Patient understands that if he develops any worsening cough or shortness of breath with rest or exertion he is to return to the ER immediately.  Patient will take Tylenol as needed for chills and fevers.  He understands signs symptoms return to ED for.  Patient is in agreement to discharge home.  Final Clinical Impression(s) / ED Diagnoses Final diagnoses:  COVID-19  Community acquired pneumonia, unspecified laterality    Rx / DC Orders ED Discharge Orders         Ordered    azithromycin (ZITHROMAX Z-PAK) 250 MG tablet     12/28/18 2056    potassium chloride 20 MEQ TBCR  2 times daily     12/28/18 2056    albuterol (VENTOLIN HFA) 108 (90 Base) MCG/ACT inhaler  Every 6 hours PRN     12/28/18 2057           Renata Caprice 12/28/18 2105    Duffy Bruce, MD 12/31/18 [phone]

## 2018-12-28 NOTE — ED Notes (Signed)
Date and time results received: 12/28/18 6:43 PM  Test: Covid Critical Value: Positive  Name of Provider Notified: Dr. Ellender Hose  Orders Received? Or Actions Taken?: no new orders at this time

## 2018-12-28 NOTE — ED Notes (Signed)
E-sig pad unavailable at time of d/c, d/c instructions given and explained to pt and he voices understanding.

## 2018-12-28 NOTE — Discharge Instructions (Addendum)
Please take antibiotics as prescribed.  Take potassium as prescribed.  Use albuterol every 6 hours as needed.  Return to the ER immediately for any shortness of breath, difficulty breathing, chest pain, fevers above 101, worsening symptoms or urgent changes in health.  Please take Tylenol 1000 mg every 6 hours as needed for chills, body aches, fevers.

## 2019-01-01 ENCOUNTER — Ambulatory Visit: Payer: Medicare Other | Admitting: Podiatry

## 2019-01-22 ENCOUNTER — Ambulatory Visit: Payer: Medicare Other | Admitting: Orthopedic Surgery

## 2019-02-12 ENCOUNTER — Other Ambulatory Visit: Payer: Self-pay

## 2019-02-12 ENCOUNTER — Ambulatory Visit (INDEPENDENT_AMBULATORY_CARE_PROVIDER_SITE_OTHER): Payer: Medicare Other | Admitting: Podiatry

## 2019-02-12 ENCOUNTER — Ambulatory Visit: Payer: Medicare Other

## 2019-02-12 DIAGNOSIS — M79675 Pain in left toe(s): Secondary | ICD-10-CM | POA: Diagnosis not present

## 2019-02-12 DIAGNOSIS — B351 Tinea unguium: Secondary | ICD-10-CM

## 2019-02-12 DIAGNOSIS — E1142 Type 2 diabetes mellitus with diabetic polyneuropathy: Secondary | ICD-10-CM

## 2019-02-13 ENCOUNTER — Telehealth: Payer: Self-pay

## 2019-02-13 ENCOUNTER — Encounter: Payer: Self-pay | Admitting: Podiatry

## 2019-02-13 DIAGNOSIS — S88111S Complete traumatic amputation at level between knee and ankle, right lower leg, sequela: Secondary | ICD-10-CM

## 2019-02-13 NOTE — Progress Notes (Signed)
  Subjective:  Patient ID: Trevor [name], male    DOB: 1955/09/26,  MRN: [id number]  Chief Complaint  Patient presents with  . Nail Problem    pt is here for a nail trim, pt is also a diabetic type 2.   64 y.o. male returns for the above complaint.  Patient presents with thickened elongated mycotic toenails of the left lower extremity x5.  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  Patient states that they have been painful when ambulating.  She has not been able to debride them down herself.  She is a type II diabetic with last A1c of 9.6.  She denies any other acute complaints.  She would like to know if this could be debrided down.  Objective:  There were no vitals filed for this visit. Podiatric Exam: Vascular: dorsalis pedis and posterior tibial pulses are palpable left capillary return is immediate. Temperature gradient is WNL. Skin turgor WNL  Sensorium: Normal Semmes Weinstein monofilament test. Normal tactile sensation left Nail Exam: Pt has thick disfigured discolored nails with subungual debris noted bilateral entire nail hallux through fifth toenails Ulcer Exam: There is no evidence of ulcer or pre-ulcerative changes or infection. Orthopedic Exam: Muscle tone and strength are WNL. No limitations in general ROM. No crepitus or effusions noted. HAV   left.  Hammer toes 2-5   left.  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 No infection or ulcers  Assessment & Plan:  Patient was evaluated and treated and all questions answered.  Onychomycosis with pain x5 -Nails palliatively debrided as below. -Educated on self-care  Procedure: Nail Debridement Rationale: pain  Type of Debridement: manual, sharp debridement. Instrumentation: Nail nipper, rotary burr. Number of Nails: 5  Procedures and Treatment: Consent by patient was obtained for treatment procedures. The patient understood the discussion of  treatment and procedures well. All questions were answered thoroughly reviewed. Debridement of mycotic and hypertrophic toenails, 1 through 5 bilateral and clearing of subungual debris. No ulceration, no infection noted.  Return Visit-Office Procedure: Patient instructed to return to the office for a follow up visit 3 months for continued evaluation and treatment.  Boneta Lucks, DPM    No follow-ups on file.

## 2019-02-13 NOTE — Telephone Encounter (Signed)
Jobe Gibbon PT states she completed and is DC from health health therapy - ptn is ready to go back to [facility] for PT - can you write referral - any questions [phone]

## 2019-02-13 NOTE — Telephone Encounter (Signed)
Outpatient referral made.

## 2019-02-28 ENCOUNTER — Ambulatory Visit: Payer: Medicare Other | Admitting: Orthopedic Surgery

## 2019-03-13 ENCOUNTER — Ambulatory Visit: Payer: Medicare Other | Attending: Physical Medicine & Rehabilitation

## 2019-03-13 ENCOUNTER — Other Ambulatory Visit: Payer: Self-pay

## 2019-03-13 DIAGNOSIS — R2689 Other abnormalities of gait and mobility: Secondary | ICD-10-CM

## 2019-03-13 DIAGNOSIS — R2681 Unsteadiness on feet: Secondary | ICD-10-CM | POA: Diagnosis present

## 2019-03-13 DIAGNOSIS — S88111S Complete traumatic amputation at level between knee and ankle, right lower leg, sequela: Secondary | ICD-10-CM | POA: Insufficient documentation

## 2019-03-13 DIAGNOSIS — M6281 Muscle weakness (generalized): Secondary | ICD-10-CM | POA: Diagnosis present

## 2019-03-13 NOTE — Therapy (Signed)
[place] MAIN [facility] SERVICES [address] [place], [place], [address] Phone: [phone]   Fax:  [phone]  Physical Therapy Evaluation  Patient Details  Name: GENEROSO BATTENFIELD MRN: [id number] Date of Birth: 1955/11/09 Referring Provider (PT): Jamse Arn, MD   Encounter Date: 03/13/2019  PT End of Session - 03/13/19 1452    Visit Number  1    Number of Visits  16    Date for PT Re-Evaluation  05/08/19    Authorization Type  1/10 eval 3/3/ 21    PT Start Time  1347    PT Stop Time  1438    PT Time Calculation (min)  51 min    Equipment Utilized During Treatment  Gait belt;Other (comment)   R prosthesis   Activity Tolerance  Patient limited by fatigue;Patient limited by pain    Behavior During Therapy  [facility] for tasks assessed/performed       Past Medical History:  Diagnosis Date  . Diabetes mellitus without complication ([place])   . Heart murmur   . Hyperlipidemia   . Hypertension   . Sleep apnea     Past Surgical History:  Procedure Laterality Date  . AMPUTATION Right 03/31/2017   Procedure: RIGHT FOOT 1ST AND 2ND RAY AMPUTATION;  Surgeon: Newt Minion, MD;  Location: [place];  Service: Orthopedics;  Laterality: Right;  . AMPUTATION Right 04/01/2017   Procedure: AMPUTATION BELOW KNEE;  Surgeon: Newt Minion, MD;  Location: [place];  Service: Orthopedics;  Laterality: Right;  . COLONOSCOPY WITH PROPOFOL N/A 10/02/2017   Procedure: COLONOSCOPY WITH PROPOFOL;  Surgeon: [name], MD;  Location: [facility] ENDOSCOPY;  Service: Gastroenterology;  Laterality: N/A;  . CORNEAL TRANSPLANT      There were no vitals filed for this visit.   Subjective Assessment - 03/13/19 1357    Subjective  Patient is returning to physical therapy for evaluation of R BKA.    Pertinent History  Patient returning to PT after multiple hospitalizations for L foot and COVID-19. Received home health therapy till 02/14/19  . Patient last seen 12/13/2018 in this  outpatient clinic. PMH includes DM, heart murmur, HLD, HTN, sleep apnea, cellulitis of L foot, hypomagnesemia, unilateral complete BKA R (2019), stage III CKD, PAD, legally blind, hypokalemia, AKI, and sepsis. Uses wheelchair primarily at home rather than walking with prosthesis.    Limitations  Standing;Walking;House hold activities;Other (comment);Lifting    How long can you sit comfortably?  hours /na/    How long can you stand comfortably?  15 minutes with UE support    How long can you walk comfortably?  walked in the house    Patient Stated Goals  walk better.    Currently in Pain?  Yes    Pain Score  2     Pain Location  Back    Pain Orientation  Lower    Pain Descriptors / Indicators  Aching    Pain Type  Chronic pain    Pain Onset  More than a month ago    Pain Frequency  Intermittent    Aggravating Factors   standing, mobilization    Pain Relieving Factors  sitting, rest    Effect of Pain on Daily Activities  limits mobility         [facility] PT Assessment - 03/13/19 0001      Assessment   Medical Diagnosis  R BKA    Referring Provider (PT)  Jamse Arn, MD  Onset Date/Surgical Date  04/01/17    Hand Dominance  Left      Precautions   Precautions  Fall      Restrictions   Weight Bearing Restrictions  No      Balance Screen   Has the patient fallen in the past 6 months  No    Has the patient had a decrease in activity level because of a fear of falling?   Yes    Is the patient reluctant to leave their home because of a fear of falling?   No      Home Environment   Living Environment  Private residence    Living Arrangements  Alone    Available Help at Discharge  Personal care attendant   5 days a week.    Type of [place]  One level    [place] - standard;Wheelchair - manual;Tub bench;Bedside commode      Prior Function   Level of Independence  Independent with basic ADLs       Standardized Balance Assessment   Standardized Balance Assessment  Berg Balance Test      Berg Balance Test   Sit to Stand  Able to stand without using hands and stabilize independently    Standing Unsupported  Able to stand 2 minutes with supervision    Sitting with Back Unsupported but Feet Supported on Floor or Stool  Able to sit safely and securely 2 minutes    Stand to Sit  Sits safely with minimal use of hands    Transfers  Able to transfer safely, definite need of hands    Standing Unsupported with Eyes Closed  Able to stand 10 seconds with supervision    Standing Unsupported with Feet Together  Able to place feet together independently and stand for 1 minute with supervision    From Standing, Reach Forward with Outstretched Arm  Can reach forward >12 cm safely (5")    From Standing Position, Pick up Object from Floor  Able to pick up shoe, needs supervision    From Standing Position, Turn to Look Behind Over each Shoulder  Looks behind one side only/other side shows less weight shift    Turn 360 Degrees  Able to turn 360 degrees safely but slowly    Standing Unsupported, Alternately Place Feet on Step/Stool  Able to stand independently and complete 8 steps >20 seconds    Standing Unsupported, One Foot in Front  Able to take small step independently and hold 30 seconds    Standing on One Leg  Tries to lift leg/unable to hold 3 seconds but remains standing independently    Total Score  41      Vitals at start of evaluation: 156/80 pulse 64    PAIN: Back pain: Worst 6/10 best 0/10 current sitting 0/10 Foot pain (L): Worst 5/10 best 0/10 current 0/10   POSTURE: Seated: slumped forward head rounded shoulders, trunk flexion  Standing: trunk flexed, rounded, weight shift onto LLE. L foot externally rotated   PROM/AROM: Decreased hip extension bilaterally with limited muscle tissue length of iliopsoas    STRENGTH:  Graded on a 0-5 scale Muscle Group Left Right  Hip Flex 4+/5 4/5   Hip Abd 4-/5 3+/5  Hip Add 4-/5 3+/5  Hip Ext 2+/5 2+/5  Hip IR/ER 4-/5 4-/5  Knee Flex 4-/5 4-/5  Knee Ext 4-/5 4-/5  Ankle  DF 4-/5   Ankle PF 4-/5    SENSATION: Decreased sensation L foot   SPECIAL TESTS: Edema LLE : slight pitting, warm   FUNCTIONAL MOBILITY: STS: weight shift onto LLE no UE support required  BALANCE: Dynamic Sitting Balance  Normal Able to sit unsupported and weight shift across midline maximally   Good Able to sit unsupported and weight shift across midline moderately   Good-/Fair+ Able to sit unsupported and weight shift across midline minimally x  Fair Minimal weight shifting ipsilateral/front, difficulty crossing midline   Fair- Reach to ipsilateral side and unable to weight shift   Poor + Able to sit unsupported with min A and reach to ipsilateral side, unable to weight shift   Poor Able to sit unsupported with mod A and reach ipsilateral/front-can't cross midline     Standing Dynamic Balance  Normal Stand independently unsupported, able to weight shift and cross midline maximally   Good Stand independently unsupported, able to weight shift and cross midline moderately   Good-/Fair+ Stand independently unsupported, able to weight shift across midline minimally   Fair Stand independently unsupported, weight shift, and reach ipsilaterally, loss of balance when crossing midline x  Poor+ Able to stand with Min A and reach ipsilaterally, unable to weight shift   Poor Able to stand with Mod A and minimally reach ipsilaterally, unable to cross midline.     Static Sitting Balance  Normal Able to maintain balance against maximal resistance   Good Able to maintain balance against moderate resistance   Good-/Fair+ Accepts minimal resistance x  Fair Able to sit unsupported without balance loss and without UE support   Poor+ Able to maintain with Minimal assistance from individual or chair   Poor Unable to maintain balance-requires mod/max support from  individual or chair     Static Standing Balance  Normal Able to maintain standing balance against maximal resistance   Good Able to maintain standing balance against moderate resistance   Good-/Fair+ Able to maintain standing balance against minimal resistance   Fair Able to stand unsupported without UE support and without LOB for 1-2 min x  Fair- Requires Min A and UE support to maintain standing without loss of balance   Poor+ Requires mod A and UE support to maintain standing without loss of balance   Poor Requires max A and UE support to maintain standing balance without loss        GAIT: Patient ambulates with SPC and R prosthesis. He demonstrates excessive L foot external rotation , limited RUE arm swing, and increasing trunk flexion with fatigue  OUTCOME MEASURES: TEST Outcome Interpretation  5 times sit<>stand  18.3 sec >60 yo, >15 sec indicates increased risk for falls  10 meter walk test  10.2 seconds with SPC                m/s <1.0 m/s indicates increased risk for falls; limited community ambulator  FOTO:  47/100 Risk adjusted 43/100 Goal: 57/100  6 minute walk test  700              Feet; terminated at 3 minutes and 59 seconds due to pain in residual limb 1000 feet is Educational psychologist 41/56  <36/56 (100% risk for falls), 37-45 (80% risk for falls); 46-51 (>50% risk for falls); 52-55 (lower risk <25% of falls)  LEFS 34/80         Patient frequently has to be redirected throughout session due to distraction with contacts,  wanting wash clothes, etc despite therapist stating evaluation time is limited and patient arriving late.      Objective measurements completed on examination: See above findings.              PT Education - 03/13/19 1452    Education Details  goals, POC    Person(s) Educated  Patient    Methods  Explanation    Comprehension  Verbalized understanding       PT Short Term Goals - 03/13/19 1456      PT  SHORT TERM GOAL #1   Title  Patient will be independent in home exercise program to improve strength/mobility for better functional independence with ADLs.    Baseline  3/3 HEP next session    Time  2    Period  Weeks    Status  New    Target Date  03/27/19      PT SHORT TERM GOAL #2   Title  Patient will ambulate for 6 minutes without seated rest break to increase tolerance for 6 minute walk test    Baseline  3/3: only able to ambulate 3 minutes and 59 seconds prior to rest break    Time  2    Period  Weeks    Status  New    Target Date  03/27/19        PT Long Term Goals - 03/13/19 1456      PT LONG TERM GOAL #1   Title  Patient will increase six minute walk test distance to >1000 with LRAD for progression to community ambulator and improve gait ability    Baseline  3/3: 700 ft, terminated early    Time  8    Period  Weeks    Status  New    Target Date  05/08/19      PT LONG TERM GOAL #2   Title  Patient will increase FOTO score to equal to or greater than 57/100 to demonstrate statistically significant improved mobility and quality of life.    Baseline  3/3: 47/100 risk adjusted 43/100    Time  8    Period  Weeks    Status  New    Target Date  05/08/19      PT LONG TERM GOAL #3   Title  Patient will increase Berg Balance score by > 6 points (47/100) to demonstrate decreased fall risk during functional activities.    Baseline  3/3: 41/56    Time  8    Period  Weeks    Status  New    Target Date  05/08/19      PT LONG TERM GOAL #4   Title  Patient will increase BLE gross strength to 4+/5 as to improve functional strength for independent gait, increased standing tolerance and increased ADL ability.    Baseline  3/3: LLE grossly 4-/5 R grossly 3+ hip 4- knee    Time  8    Period  Weeks    Status  New    Target Date  05/08/19             Plan - 03/13/19 1454    Clinical Impression Statement  Patient is a pleasant 64 year old male who is returning to this  clinic for therapy s/p two hospitalizations and a period of home health physical therapy. Patient is ambulating with SPC for short durations however is limited in his stability scoring a 41/56 on his BERG as well as  capacity for ambulation with decreased duration of ambulation not being able to complete the 6 minute walk test due to fatigue and pain in residual limb and back. Patient will benefit from skilled physical therapy to increase stability, strength, and mobility for improved quality of life and independence with ADL and iADLs.    Personal Factors and Comorbidities  Age;Comorbidity 3+;Finances;Fitness;Past/Current Experience;Social Background;Time since onset of injury/illness/exacerbation;Transportation    Comorbidities  DM, heart murmur, HLD, HTN, sleep apnea, cellulitis of L foot, hypomagnesemia, unilateral complete BKA R (2019), stage III CKD, PAD, legally blind, hypokalemia, AKI, and sepsis    Examination-Activity Limitations  Bathing;Bed Mobility;Caring for [employer];Locomotion Level;Lift;Stand;Toileting;Transfers    Examination-Participation Restrictions  Church;Cleaning;Community Activity;Driving;Interpersonal Relationship;Laundry;Shop;Meal Prep;Yard Work    Merchant navy officer  Evolving/Moderate complexity    Clinical Decision Making  Moderate    Rehab Potential  Fair    PT Frequency  2x / week    PT Duration  8 weeks    PT Treatment/Interventions  ADLs/Self Care Home Management;Cryotherapy;Electrical Stimulation;Ultrasound;Traction;Moist Heat;DME Instruction;Gait training;Stair training;Functional mobility training;Therapeutic activities;Therapeutic exercise;Patient/family education;Neuromuscular re-education;Balance training;Prosthetic Training;Wheelchair mobility training;Manual techniques;Manual lymph drainage;Compression bandaging;Taping;Energy conservation;Passive range of motion;Aquatic Therapy;Vestibular    PT Next Visit  Plan  give HEP, prone    Consulted and Agree with Plan of Care  Patient       Patient will benefit from skilled therapeutic intervention in order to improve the following deficits and impairments:  Abnormal gait, Decreased activity tolerance, Decreased balance, Decreased knowledge of precautions, Decreased endurance, Decreased knowledge of use of DME, Decreased mobility, Decreased range of motion, Difficulty walking, Decreased strength, Increased edema, Impaired flexibility, Impaired perceived functional ability, Prosthetic Dependency, Postural dysfunction, Improper body mechanics, Pain, Decreased skin integrity, Impaired vision/preception, Impaired sensation  Visit Diagnosis: Other abnormalities of gait and mobility  Muscle weakness (generalized)  Unsteadiness on feet     Problem List Patient Active Problem List   Diagnosis Date Noted  . Elevated lactic acid level   . Diabetic ketoacidosis without coma associated with type 2 diabetes mellitus ([place])   . Cellulitis of left foot 12/17/2018  . Pain due to onychomycosis of toenail of right foot 07/05/2018  . Abnormality of gait 10/12/2017  . Type 2 diabetes mellitus with hyperglycemia, with long-term current use of insulin ([place])   . Flatulence   . Hypomagnesemia   . Unilateral complete BKA, right, sequela ([place])   . Benign essential HTN   . Hypoalbuminemia due to protein-calorie malnutrition ([place])   . S/P below knee amputation, right ([place]) 04/12/2017  . Acute blood loss anemia   . Other encephalopathy 04/08/2017  . Encephalopathy 04/08/2017  . Altered mental status 04/08/2017  . Labile blood pressure   . Labile blood glucose   . Drug induced constipation   . Stage 3 chronic kidney disease   . Bacteremia   . S/P unilateral BKA (below knee amputation), right ([place]) 04/04/2017  . PAD (peripheral artery disease) ([place])   . Type 2 diabetes mellitus with right diabetic foot ulcer ([place])   . Post-operative pain   . Legally blind   .  Upper GI bleed   . Streptococcal bacteremia 04/01/2017  . Hypokalemia 03/30/2017  . Uncontrolled type 2 diabetes mellitus with hyperglycemia, with long-term current use of insulin ([place]) 03/30/2017  . Type 2 diabetes mellitus with peripheral neuropathy ([place]) 03/30/2017  . AKI (acute kidney injury) ([place]) 03/30/2017  . CKD stage 3 due to type 2 diabetes mellitus ([place]) 03/30/2017  . Sepsis ([place]) 03/30/2017  . Heart murmur  11/22/2016  . Hyperlipidemia associated with type 2 diabetes mellitus ([place]) 11/22/2016  . Obstructive sleep apnea syndrome 11/22/2016  . Essential hypertension 12/17/2012   Janna Arch, PT, DPT   03/13/2019, 3:03 PM  [place] MAIN [facility] SERVICES [address] [place], [place], [address] Phone: [phone]   Fax:  [phone]  Name: ARYEN KISE MRN: [id number] Date of Birth: 01-17-1955

## 2019-03-18 ENCOUNTER — Other Ambulatory Visit: Payer: Self-pay

## 2019-03-18 ENCOUNTER — Ambulatory Visit: Payer: Medicare Other

## 2019-03-18 DIAGNOSIS — M6281 Muscle weakness (generalized): Secondary | ICD-10-CM

## 2019-03-18 DIAGNOSIS — R2689 Other abnormalities of gait and mobility: Secondary | ICD-10-CM

## 2019-03-18 DIAGNOSIS — R2681 Unsteadiness on feet: Secondary | ICD-10-CM

## 2019-03-18 DIAGNOSIS — S88111S Complete traumatic amputation at level between knee and ankle, right lower leg, sequela: Secondary | ICD-10-CM

## 2019-03-18 NOTE — Therapy (Signed)
[place] MAIN [facility] SERVICES [address] [place], [place], [address] Phone: [phone]   Fax:  [phone]  Physical Therapy Treatment  Patient Details  Name: Trevor [name] MRN: [id number] Date of Birth: 1955-05-03 Referring Provider (PT): Jamse Arn, MD   Encounter Date: 03/18/2019  PT End of Session - 03/18/19 1343    Visit Number  2    Number of Visits  16    Date for PT Re-Evaluation  05/08/19    Authorization Type  2/10 eval 3/3/ 21    PT Start Time  [id number]    PT Stop Time  1340    PT Time Calculation (min)  33 min    Equipment Utilized During Treatment  Gait belt;Other (comment)   R prosthesis   Activity Tolerance  Patient limited by fatigue;Patient limited by pain    Behavior During Therapy  [facility] for tasks assessed/performed       Past Medical History:  Diagnosis Date  . Diabetes mellitus without complication ([place])   . Heart murmur   . Hyperlipidemia   . Hypertension   . Sleep apnea     Past Surgical History:  Procedure Laterality Date  . AMPUTATION Right 03/31/2017   Procedure: RIGHT FOOT 1ST AND 2ND RAY AMPUTATION;  Surgeon: Newt Minion, MD;  Location: [place];  Service: Orthopedics;  Laterality: Right;  . AMPUTATION Right 04/01/2017   Procedure: AMPUTATION BELOW KNEE;  Surgeon: Newt Minion, MD;  Location: [place];  Service: Orthopedics;  Laterality: Right;  . COLONOSCOPY WITH PROPOFOL N/A 10/02/2017   Procedure: COLONOSCOPY WITH PROPOFOL;  Surgeon: [name], MD;  Location: [facility] ENDOSCOPY;  Service: Gastroenterology;  Laterality: N/A;  . CORNEAL TRANSPLANT      There were no vitals filed for this visit.  Subjective Assessment - 03/18/19 1341    Subjective  Patient arrived late to physical therapy session. Has to leave early for podiatry appointment. No falls or LOB since last session.    Pertinent History  Patient returning to PT after multiple hospitalizations for L foot and COVID-19. Received home health  therapy till 02/14/19  . Patient last seen 12/13/2018 in this outpatient clinic. PMH includes DM, heart murmur, HLD, HTN, sleep apnea, cellulitis of L foot, hypomagnesemia, unilateral complete BKA R (2019), stage III CKD, PAD, legally blind, hypokalemia, AKI, and sepsis. Uses wheelchair primarily at home rather than walking with prosthesis.    Limitations  Standing;Walking;House hold activities;Other (comment);Lifting    How long can you sit comfortably?  hours /na/    How long can you stand comfortably?  15 minutes with UE support    How long can you walk comfortably?  walked in the house    Patient Stated Goals  walk better.    Currently in Pain?  No/denies           Vitals at start of session: 121/65 pulse 64   Standing with CGA: -6" step toe forward SUE support 12x each LE -6" step lateral toe taps no UE support 11x each LE airex pad: 6" step static hold 30 seconds each position for modified tandem positioning 2 sets each LE placement   Seated marches with 3# ankle weights x 15 each; Seated hamstring GTB resistance 12x each LE  Seated adductor squeeze with GTB resistance x 15; Seated  LAQ with 3# ankle weights x 15;   Sit to stand from low surface 10x no UE support; airex pad under feet. Close CGA, cueing  for body mechanics and sequencing.   Patient educated on proper fit of prosthesis. Urged to call prosthetist for new fitting.     Pt educated throughout session about proper posture and technique with exercises. Improved exercise technique, movement at target joints, use of target muscles after min to mod verbal, visual, tactile cues.              PT Education - 03/18/19 1342    Education Details  calling prosthetist for fitting, body mechanics    Person(s) Educated  Patient    Methods  Explanation;Demonstration;Tactile cues;Verbal cues    Comprehension  Verbalized understanding;Returned demonstration;Verbal cues required;Tactile cues required       PT Short Term  Goals - 03/13/19 1456      PT SHORT TERM GOAL #1   Title  Patient will be independent in home exercise program to improve strength/mobility for better functional independence with ADLs.    Baseline  3/3 HEP next session    Time  2    Period  Weeks    Status  New    Target Date  03/27/19      PT SHORT TERM GOAL #2   Title  Patient will ambulate for 6 minutes without seated rest break to increase tolerance for 6 minute walk test    Baseline  3/3: only able to ambulate 3 minutes and 59 seconds prior to rest break    Time  2    Period  Weeks    Status  New    Target Date  03/27/19        PT Long Term Goals - 03/13/19 1456      PT LONG TERM GOAL #1   Title  Patient will increase six minute walk test distance to >1000 with LRAD for progression to community ambulator and improve gait ability    Baseline  3/3: 700 ft, terminated early    Time  8    Period  Weeks    Status  New    Target Date  05/08/19      PT LONG TERM GOAL #2   Title  Patient will increase FOTO score to equal to or greater than 57/100 to demonstrate statistically significant improved mobility and quality of life.    Baseline  3/3: 47/100 risk adjusted 43/100    Time  8    Period  Weeks    Status  New    Target Date  05/08/19      PT LONG TERM GOAL #3   Title  Patient will increase Berg Balance score by > 6 points (47/100) to demonstrate decreased fall risk during functional activities.    Baseline  3/3: 41/56    Time  8    Period  Weeks    Status  New    Target Date  05/08/19      PT LONG TERM GOAL #4   Title  Patient will increase BLE gross strength to 4+/5 as to improve functional strength for independent gait, increased standing tolerance and increased ADL ability.    Baseline  3/3: LLE grossly 4-/5 R grossly 3+ hip 4- knee    Time  8    Period  Weeks    Status  New    Target Date  05/08/19            Plan - 03/18/19 1343    Clinical Impression Statement  Patient arrived late to session and  had to leave early for doctors apointment  limiting session duration. Patient is challenged with stability on prosthetic limb and additionally has fatigue of LLE resulting in compensatory trunk mobility. Patient will benefit from skilled physical therapy to increase stability, strength, and mobility for improved quality of life and independence with ADL and iADLs.    Personal Factors and Comorbidities  Age;Comorbidity 3+;Finances;Fitness;Past/Current Experience;Social Background;Time since onset of injury/illness/exacerbation;Transportation    Comorbidities  DM, heart murmur, HLD, HTN, sleep apnea, cellulitis of L foot, hypomagnesemia, unilateral complete BKA R (2019), stage III CKD, PAD, legally blind, hypokalemia, AKI, and sepsis    Examination-Activity Limitations  Bathing;Bed Mobility;Caring for [employer];Locomotion Level;Lift;Stand;Toileting;Transfers    Examination-Participation Restrictions  Church;Cleaning;Community Activity;Driving;Interpersonal Relationship;Laundry;Shop;Meal Prep;Yard Work    [employer]chant [employer] officer  Evolving/Moderate complexity    Rehab Potential  Fair    PT Frequency  2x / week    PT Duration  8 weeks    PT Treatment/Interventions  ADLs/Self Care Home Management;Cryotherapy;Electrical Stimulation;Ultrasound;Traction;Moist Heat;DME Instruction;Gait training;Stair training;Functional mobility training;Therapeutic activities;Therapeutic exercise;Patient/family education;Neuromuscular re-education;Balance training;Prosthetic Training;Wheelchair mobility training;Manual techniques;Manual lymph drainage;Compression bandaging;Taping;Energy conservation;Passive range of motion;Aquatic Therapy;Vestibular    PT Next Visit Plan  give HEP, prone    Consulted and Agree with Plan of Care  Patient       Patient will benefit from skilled therapeutic intervention in order to improve the following deficits and impairments:  Abnormal  gait, Decreased activity tolerance, Decreased balance, Decreased knowledge of precautions, Decreased endurance, Decreased knowledge of use of DME, Decreased mobility, Decreased range of motion, Difficulty walking, Decreased strength, Increased edema, Impaired flexibility, Impaired perceived functional ability, Prosthetic Dependency, Postural dysfunction, Improper body mechanics, Pain, Decreased skin integrity, Impaired vision/preception, Impaired sensation  Visit Diagnosis: Other abnormalities of gait and mobility  Muscle weakness (generalized)  Unsteadiness on feet  Unilateral complete BKA, right, sequela ([place])     Problem List Patient Active Problem List   Diagnosis Date Noted  . Elevated lactic acid level   . Diabetic ketoacidosis without coma associated with type 2 diabetes mellitus ([place])   . Cellulitis of left foot 12/17/2018  . Pain due to onychomycosis of toenail of right foot 07/05/2018  . Abnormality of gait 10/12/2017  . Type 2 diabetes mellitus with hyperglycemia, with long-term current use of insulin ([place])   . Flatulence   . Hypomagnesemia   . Unilateral complete BKA, right, sequela ([place])   . Benign essential HTN   . Hypoalbuminemia due to protein-calorie malnutrition ([place])   . S/P below knee amputation, right ([place]) 04/12/2017  . Acute blood loss anemia   . Other encephalopathy 04/08/2017  . Encephalopathy 04/08/2017  . Altered mental status 04/08/2017  . Labile blood pressure   . Labile blood glucose   . Drug induced constipation   . Stage 3 chronic kidney disease   . Bacteremia   . S/P unilateral BKA (below knee amputation), right ([place]) 04/04/2017  . PAD (peripheral artery disease) ([place])   . Type 2 diabetes mellitus with right diabetic foot ulcer ([place])   . Post-operative pain   . Legally blind   . Upper GI bleed   . Streptococcal bacteremia 04/01/2017  . Hypokalemia 03/30/2017  . Uncontrolled type 2 diabetes mellitus with hyperglycemia, with long-term  current use of insulin ([place]) 03/30/2017  . Type 2 diabetes mellitus with peripheral neuropathy ([place]) 03/30/2017  . AKI (acute kidney injury) ([place]) 03/30/2017  . CKD stage 3 due to type 2 diabetes mellitus ([place]) 03/30/2017  . Sepsis ([place]) 03/30/2017  . Heart murmur 11/22/2016  . Hyperlipidemia associated with type  2 diabetes mellitus ([place]) 11/22/2016  . Obstructive sleep apnea syndrome 11/22/2016  . Essential hypertension 12/17/2012   Janna Arch, PT, DPT   03/18/2019, 1:45 PM  [place] MAIN [facility] SERVICES [address] [place], [place], [address] Phone: [phone]   Fax:  [phone]  Name: Trevor [name] MRN: [id number] Date of Birth: 1955-04-18

## 2019-03-20 ENCOUNTER — Other Ambulatory Visit: Payer: Self-pay

## 2019-03-20 ENCOUNTER — Ambulatory Visit: Payer: Medicare Other

## 2019-03-20 DIAGNOSIS — R2689 Other abnormalities of gait and mobility: Secondary | ICD-10-CM

## 2019-03-20 DIAGNOSIS — M6281 Muscle weakness (generalized): Secondary | ICD-10-CM

## 2019-03-20 DIAGNOSIS — R2681 Unsteadiness on feet: Secondary | ICD-10-CM

## 2019-03-20 DIAGNOSIS — S88111S Complete traumatic amputation at level between knee and ankle, right lower leg, sequela: Secondary | ICD-10-CM

## 2019-03-20 NOTE — Therapy (Signed)
[place] MAIN [facility] SERVICES [address] [place], [place], [address] Phone: [phone]   Fax:  [phone]  Physical Therapy Treatment  Patient Details  Name: Trevor [name] MRN: [id number] Date of Birth: March 28, 1955 Referring Provider (PT): Jamse Arn, MD   Encounter Date: 03/20/2019  PT End of Session - 03/20/19 1336    Visit Number  3    Number of Visits  16    Date for PT Re-Evaluation  05/08/19    Authorization Type  3/10 eval 3/3/ 21    PT Start Time  1312    PT Stop Time  1345    PT Time Calculation (min)  33 min    Equipment Utilized During Treatment  Gait belt;Other (comment)   R prosthesis   Activity Tolerance  Patient limited by fatigue;Patient tolerated treatment well    Behavior During Therapy  [facility] for tasks assessed/performed       Past Medical History:  Diagnosis Date  . Diabetes mellitus without complication ([place])   . Heart murmur   . Hyperlipidemia   . Hypertension   . Sleep apnea     Past Surgical History:  Procedure Laterality Date  . AMPUTATION Right 03/31/2017   Procedure: RIGHT FOOT 1ST AND 2ND RAY AMPUTATION;  Surgeon: Newt Minion, MD;  Location: [place];  Service: Orthopedics;  Laterality: Right;  . AMPUTATION Right 04/01/2017   Procedure: AMPUTATION BELOW KNEE;  Surgeon: Newt Minion, MD;  Location: [place];  Service: Orthopedics;  Laterality: Right;  . COLONOSCOPY WITH PROPOFOL N/A 10/02/2017   Procedure: COLONOSCOPY WITH PROPOFOL;  Surgeon: [name], MD;  Location: [facility] ENDOSCOPY;  Service: Gastroenterology;  Laterality: N/A;  . CORNEAL TRANSPLANT      There were no vitals filed for this visit.  Subjective Assessment - 03/20/19 1323    Subjective  Patient arrived late to session again. No falls or LOB since last session. went to dermatologist to get foot wrapped.    Pertinent History  Patient returning to PT after multiple hospitalizations for L foot and COVID-19. Received home health  therapy till 02/14/19  . Patient last seen 12/13/2018 in this outpatient clinic. PMH includes DM, heart murmur, HLD, HTN, sleep apnea, cellulitis of L foot, hypomagnesemia, unilateral complete BKA R (2019), stage III CKD, PAD, legally blind, hypokalemia, AKI, and sepsis. Uses wheelchair primarily at home rather than walking with prosthesis.    Limitations  Standing;Walking;House hold activities;Other (comment);Lifting    How long can you sit comfortably?  hours /na/    How long can you stand comfortably?  15 minutes with UE support    How long can you walk comfortably?  walked in the house    Patient Stated Goals  walk better.    Currently in Pain?  No/denies         Patient arrived late to session limiting session duration.   Vitals at start of session: 133/73   Treatment:   Side stepping squat with support bar  8x each direction  intermittent BUE support; cueing for upright posture and squat position   dynadisc under LLE for weight shift onto prosthetic RLE 30 second holds. x2 trials, excessive tremors due to fatigue   Weighted ball (3000Gr) chest press 10x, challenging to LUE   Weighted ball (3000 Gr) straight arms raise 10x ; very challenging to LUE   Standing 6" step BUE support 10x each LE, cueing for weight shift onto RLE.   Nustep Lvl 3-5 ;  seat 18 6 minutes for cardiovascular challenge, strengthening, and capacity for functional mobility.   Seated hip flexion marches with GTB  x 15; Seated GTB hip abduction x 15 bilateral; one LE at a time Seated gluteal activation 10x 3 second holds      Patient  Educated on need to be on time for appointments for full benefit from therapy   Pt educated throughout session about proper posture and technique with exercises. Improved exercise technique, movement at target joints, use of target muscles after min to mod verbal, visual, tactile cues.                        PT Education - 03/20/19 1327    Education Details   compliance with arriving on time .    Person(s) Educated  Patient    Methods  Explanation;Demonstration;Tactile cues;Verbal cues    Comprehension  Verbalized understanding;Returned demonstration;Verbal cues required;Tactile cues required       PT Short Term Goals - 03/13/19 1456      PT SHORT TERM GOAL #1   Title  Patient will be independent in home exercise program to improve strength/mobility for better functional independence with ADLs.    Baseline  3/3 HEP next session    Time  2    Period  Weeks    Status  New    Target Date  03/27/19      PT SHORT TERM GOAL #2   Title  Patient will ambulate for 6 minutes without seated rest break to increase tolerance for 6 minute walk test    Baseline  3/3: only able to ambulate 3 minutes and 59 seconds prior to rest break    Time  2    Period  Weeks    Status  New    Target Date  03/27/19        PT Long Term Goals - 03/13/19 1456      PT LONG TERM GOAL #1   Title  Patient will increase six minute walk test distance to >1000 with LRAD for progression to community ambulator and improve gait ability    Baseline  3/3: 700 ft, terminated early    Time  8    Period  Weeks    Status  New    Target Date  05/08/19      PT LONG TERM GOAL #2   Title  Patient will increase FOTO score to equal to or greater than 57/100 to demonstrate statistically significant improved mobility and quality of life.    Baseline  3/3: 47/100 risk adjusted 43/100    Time  8    Period  Weeks    Status  New    Target Date  05/08/19      PT LONG TERM GOAL #3   Title  Patient will increase Berg Balance score by > 6 points (47/100) to demonstrate decreased fall risk during functional activities.    Baseline  3/3: 41/56    Time  8    Period  Weeks    Status  New    Target Date  05/08/19      PT LONG TERM GOAL #4   Title  Patient will increase BLE gross strength to 4+/5 as to improve functional strength for independent gait, increased standing tolerance and  increased ADL ability.    Baseline  3/3: LLE grossly 4-/5 R grossly 3+ hip 4- knee    Time  8  Period  Weeks    Status  New    Target Date  05/08/19            Plan - 03/20/19 1340    Clinical Impression Statement  Patient arrived late to session limiting session duration. Education provided on need for arrival on time for full session to get benefits of physical therapy.  Static stability and weight shift onto prosthetic limb is challenging for patient at this time requiring cueing for body mechanics. Capacity for prolonged muscle activation is limited with quick fatigue. Patient will benefit from skilled physical therapy to increase stability, strength, and mobility for improved quality of life and independence with ADL and iADLs.    Personal Factors and Comorbidities  Age;Comorbidity 3+;Finances;Fitness;Past/Current Experience;Social Background;Time since onset of injury/illness/exacerbation;Transportation    Comorbidities  DM, heart murmur, HLD, HTN, sleep apnea, cellulitis of L foot, hypomagnesemia, unilateral complete BKA R (2019), stage III CKD, PAD, legally blind, hypokalemia, AKI, and sepsis    Examination-Activity Limitations  Bathing;Bed Mobility;Caring for [employer];Locomotion Level;Lift;Stand;Toileting;Transfers    Examination-Participation Restrictions  Church;Cleaning;Community Activity;Driving;Interpersonal Relationship;Laundry;Shop;Meal Prep;Yard Work    [employer]chant [employer] officer  Evolving/Moderate complexity    Rehab Potential  Fair    PT Frequency  2x / week    PT Duration  8 weeks    PT Treatment/Interventions  ADLs/Self Care Home Management;Cryotherapy;Electrical Stimulation;Ultrasound;Traction;Moist Heat;DME Instruction;Gait training;Stair training;Functional mobility training;Therapeutic activities;Therapeutic exercise;Patient/family education;Neuromuscular re-education;Balance training;Prosthetic  Training;Wheelchair mobility training;Manual techniques;Manual lymph drainage;Compression bandaging;Taping;Energy conservation;Passive range of motion;Aquatic Therapy;Vestibular    PT Next Visit Plan  give HEP, prone    Consulted and Agree with Plan of Care  Patient       Patient will benefit from skilled therapeutic intervention in order to improve the following deficits and impairments:  Abnormal gait, Decreased activity tolerance, Decreased balance, Decreased knowledge of precautions, Decreased endurance, Decreased knowledge of use of DME, Decreased mobility, Decreased range of motion, Difficulty walking, Decreased strength, Increased edema, Impaired flexibility, Impaired perceived functional ability, Prosthetic Dependency, Postural dysfunction, Improper body mechanics, Pain, Decreased skin integrity, Impaired vision/preception, Impaired sensation  Visit Diagnosis: Other abnormalities of gait and mobility  Muscle weakness (generalized)  Unsteadiness on feet  Unilateral complete BKA, right, sequela ([place])     Problem List Patient Active Problem List   Diagnosis Date Noted  . Elevated lactic acid level   . Diabetic ketoacidosis without coma associated with type 2 diabetes mellitus ([place])   . Cellulitis of left foot 12/17/2018  . Pain due to onychomycosis of toenail of right foot 07/05/2018  . Abnormality of gait 10/12/2017  . Type 2 diabetes mellitus with hyperglycemia, with long-term current use of insulin ([place])   . Flatulence   . Hypomagnesemia   . Unilateral complete BKA, right, sequela ([place])   . Benign essential HTN   . Hypoalbuminemia due to protein-calorie malnutrition ([place])   . S/P below knee amputation, right ([place]) 04/12/2017  . Acute blood loss anemia   . Other encephalopathy 04/08/2017  . Encephalopathy 04/08/2017  . Altered mental status 04/08/2017  . Labile blood pressure   . Labile blood glucose   . Drug induced constipation   . Stage 3 chronic kidney disease    . Bacteremia   . S/P unilateral BKA (below knee amputation), right ([place]) 04/04/2017  . PAD (peripheral artery disease) ([place])   . Type 2 diabetes mellitus with right diabetic foot ulcer ([place])   . Post-operative pain   . Legally blind   . Upper GI bleed   .  Streptococcal bacteremia 04/01/2017  . Hypokalemia 03/30/2017  . Uncontrolled type 2 diabetes mellitus with hyperglycemia, with long-term current use of insulin ([place]) 03/30/2017  . Type 2 diabetes mellitus with peripheral neuropathy ([place]) 03/30/2017  . AKI (acute kidney injury) ([place]) 03/30/2017  . CKD stage 3 due to type 2 diabetes mellitus ([place]) 03/30/2017  . Sepsis ([place]) 03/30/2017  . Heart murmur 11/22/2016  . Hyperlipidemia associated with type 2 diabetes mellitus ([place]) 11/22/2016  . Obstructive sleep apnea syndrome 11/22/2016  . Essential hypertension 12/17/2012   Janna Arch, PT, DPT   03/20/2019, 1:45 PM  [place] MAIN [facility] SERVICES [address] [place], [place], [address] Phone: [phone]   Fax:  [phone]  Name: Trevor [name] MRN: [id number] Date of Birth: 12-06-1955

## 2019-03-25 ENCOUNTER — Ambulatory Visit: Payer: Medicare Other

## 2019-03-25 ENCOUNTER — Other Ambulatory Visit: Payer: Self-pay

## 2019-03-25 DIAGNOSIS — S88111S Complete traumatic amputation at level between knee and ankle, right lower leg, sequela: Secondary | ICD-10-CM

## 2019-03-25 DIAGNOSIS — R2681 Unsteadiness on feet: Secondary | ICD-10-CM

## 2019-03-25 DIAGNOSIS — R2689 Other abnormalities of gait and mobility: Secondary | ICD-10-CM | POA: Diagnosis not present

## 2019-03-25 DIAGNOSIS — M6281 Muscle weakness (generalized): Secondary | ICD-10-CM

## 2019-03-25 NOTE — Therapy (Signed)
[place] MAIN [facility] SERVICES [address] [place], [place], [address] Phone: [phone]   Fax:  [phone]  Physical Therapy Treatment  Patient Details  Name: Trevor [name] MRN: [id number] Date of Birth: [birth year] Referring Provider (PT): Jamse Arn, MD   Encounter Date: 03/25/2019  PT End of Session - 03/25/19 1348    Visit Number  4    Number of Visits  16    Date for PT Re-Evaluation  05/08/19    Authorization Type  4/10 eval 3/3/ 21    PT Start Time  1302    PT Stop Time  1340    PT Time Calculation (min)  38 min    Equipment Utilized During Treatment  Gait belt;Other (comment)   R prosthesis   Activity Tolerance  Patient limited by fatigue;Patient tolerated treatment well    Behavior During Therapy  [facility] for tasks assessed/performed       Past Medical History:  Diagnosis Date  . Diabetes mellitus without complication ([place])   . Heart murmur   . Hyperlipidemia   . Hypertension   . Sleep apnea     Past Surgical History:  Procedure Laterality Date  . AMPUTATION Right 03/31/2017   Procedure: RIGHT FOOT 1ST AND 2ND RAY AMPUTATION;  Surgeon: Newt Minion, MD;  Location: [place];  Service: Orthopedics;  Laterality: Right;  . AMPUTATION Right 04/01/2017   Procedure: AMPUTATION BELOW KNEE;  Surgeon: Newt Minion, MD;  Location: [place];  Service: Orthopedics;  Laterality: Right;  . COLONOSCOPY WITH PROPOFOL N/A 10/02/2017   Procedure: COLONOSCOPY WITH PROPOFOL;  Surgeon: [name], MD;  Location: [facility] ENDOSCOPY;  Service: Gastroenterology;  Laterality: N/A;  . CORNEAL TRANSPLANT      There were no vitals filed for this visit.  Subjective Assessment - 03/25/19 1306    Subjective  Patient presents on time to today's session. Reports no falls or LOB since last session. Has been doing his HEp sometimes.    Pertinent History  Patient returning to PT after multiple hospitalizations for L foot and COVID-19. Received home  health therapy till 02/14/19  . Patient last seen 12/13/2018 in this outpatient clinic. PMH includes DM, heart murmur, HLD, HTN, sleep apnea, cellulitis of L foot, hypomagnesemia, unilateral complete BKA R (2019), stage III CKD, PAD, legally blind, hypokalemia, AKI, and sepsis. Uses wheelchair primarily at home rather than walking with prosthesis.    Limitations  Standing;Walking;House hold activities;Other (comment);Lifting    How long can you sit comfortably?  hours /na/    How long can you stand comfortably?  15 minutes with UE support    How long can you walk comfortably?  walked in the house    Patient Stated Goals  walk better.    Currently in Pain?  No/denies         Vitals at start of session 137/73 pulse 64   Treat Ambulate > 1000 ft with SPC and single seated rest break; cueing for upright posture, step length. Hip extension limited bilaterally with resultant increase in low back pain with prolonged ambulation. Close CGA   Supine:  SLR with opp LE in hooklying 10x each LE  Straight leg abduction 10x each LE, opp LE in hooklying to support back.  TrA activation 10x 5 second holds  Sidelying:  Hip flexor stretch 30 seconds each LE (added to HEP) Clamshell, tactile cue for body mechanics 10x each LE    Sit to stand with 2000 gr  weighted ball press 10x from raised plinth table, very challenging on last few exercises.   Standing: Hip extension 10x each LE  Seated trunk row with PVC pipe 10x     Pt educated throughout session about proper posture and technique with exercises. Improved exercise technique, movement at target joints, use of target muscles after min to mod verbal, visual, tactile cues.                  PT Education - 03/25/19 1346    Education Details  exercise technique, body mechanics    Person(s) Educated  Patient    Methods  Explanation;Demonstration;Tactile cues;Verbal cues    Comprehension  Verbalized understanding;Returned  demonstration;Verbal cues required;Tactile cues required       PT Short Term Goals - 03/13/19 1456      PT SHORT TERM GOAL #1   Title  Patient will be independent in home exercise program to improve strength/mobility for better functional independence with ADLs.    Baseline  3/3 HEP next session    Time  2    Period  Weeks    Status  New    Target Date  03/27/19      PT SHORT TERM GOAL #2   Title  Patient will ambulate for 6 minutes without seated rest break to increase tolerance for 6 minute walk test    Baseline  3/3: only able to ambulate 3 minutes and 59 seconds prior to rest break    Time  2    Period  Weeks    Status  New    Target Date  03/27/19        PT Long Term Goals - 03/13/19 1456      PT LONG TERM GOAL #1   Title  Patient will increase six minute walk test distance to >1000 with LRAD for progression to community ambulator and improve gait ability    Baseline  3/3: 700 ft, terminated early    Time  8    Period  Weeks    Status  New    Target Date  05/08/19      PT LONG TERM GOAL #2   Title  Patient will increase FOTO score to equal to or greater than 57/100 to demonstrate statistically significant improved mobility and quality of life.    Baseline  3/3: 47/100 risk adjusted 43/100    Time  8    Period  Weeks    Status  New    Target Date  05/08/19      PT LONG TERM GOAL #3   Title  Patient will increase Berg Balance score by > 6 points (47/100) to demonstrate decreased fall risk during functional activities.    Baseline  3/3: 41/56    Time  8    Period  Weeks    Status  New    Target Date  05/08/19      PT LONG TERM GOAL #4   Title  Patient will increase BLE gross strength to 4+/5 as to improve functional strength for independent gait, increased standing tolerance and increased ADL ability.    Baseline  3/3: LLE grossly 4-/5 R grossly 3+ hip 4- knee    Time  8    Period  Weeks    Status  New    Target Date  05/08/19            Plan -  03/25/19 1351    Clinical Impression Statement  Patient is  challenged with prolonged muscle recruitment and stability. Limited hip extension is noted with prolonged ambulation resulting in increase in low back pain. Patient educated on need for hip flexor stretch compliance, added sidelying stretch to HEP. Patient will benefit from skilled physical therapy to increase stability, strength, and mobility for improved quality of life and independence with ADL and iADLs.    Personal Factors and Comorbidities  Age;Comorbidity 3+;Finances;Fitness;Past/Current Experience;Social Background;Time since onset of injury/illness/exacerbation;Transportation    Comorbidities  DM, heart murmur, HLD, HTN, sleep apnea, cellulitis of L foot, hypomagnesemia, unilateral complete BKA R (2019), stage III CKD, PAD, legally blind, hypokalemia, AKI, and sepsis    Examination-Activity Limitations  Bathing;Bed Mobility;Caring for [employer];Locomotion Level;Lift;Stand;Toileting;Transfers    Examination-Participation Restrictions  Church;Cleaning;Community Activity;Driving;Interpersonal Relationship;Laundry;Shop;Meal Prep;Yard Work    [employer]chant [employer] officer  Evolving/Moderate complexity    Rehab Potential  Fair    PT Frequency  2x / week    PT Duration  8 weeks    PT Treatment/Interventions  ADLs/Self Care Home Management;Cryotherapy;Electrical Stimulation;Ultrasound;Traction;Moist Heat;DME Instruction;Gait training;Stair training;Functional mobility training;Therapeutic activities;Therapeutic exercise;Patient/family education;Neuromuscular re-education;Balance training;Prosthetic Training;Wheelchair mobility training;Manual techniques;Manual lymph drainage;Compression bandaging;Taping;Energy conservation;Passive range of motion;Aquatic Therapy;Vestibular    PT Next Visit Plan  give HEP, prone    Consulted and Agree with Plan of Care  Patient       Patient will benefit from  skilled therapeutic intervention in order to improve the following deficits and impairments:  Abnormal gait, Decreased activity tolerance, Decreased balance, Decreased knowledge of precautions, Decreased endurance, Decreased knowledge of use of DME, Decreased mobility, Decreased range of motion, Difficulty walking, Decreased strength, Increased edema, Impaired flexibility, Impaired perceived functional ability, Prosthetic Dependency, Postural dysfunction, Improper body mechanics, Pain, Decreased skin integrity, Impaired vision/preception, Impaired sensation  Visit Diagnosis: Other abnormalities of gait and mobility  Muscle weakness (generalized)  Unsteadiness on feet  Unilateral complete BKA, right, sequela ([place])     Problem List Patient Active Problem List   Diagnosis Date Noted  . Elevated lactic acid level   . Diabetic ketoacidosis without coma associated with type 2 diabetes mellitus ([place])   . Cellulitis of left foot 12/17/2018  . Pain due to onychomycosis of toenail of right foot 07/05/2018  . Abnormality of gait 10/12/2017  . Type 2 diabetes mellitus with hyperglycemia, with long-term current use of insulin ([place])   . Flatulence   . Hypomagnesemia   . Unilateral complete BKA, right, sequela ([place])   . Benign essential HTN   . Hypoalbuminemia due to protein-calorie malnutrition ([place])   . S/P below knee amputation, right ([place]) 04/12/2017  . Acute blood loss anemia   . Other encephalopathy 04/08/2017  . Encephalopathy 04/08/2017  . Altered mental status 04/08/2017  . Labile blood pressure   . Labile blood glucose   . Drug induced constipation   . Stage 3 chronic kidney disease   . Bacteremia   . S/P unilateral BKA (below knee amputation), right ([place]) 04/04/2017  . PAD (peripheral artery disease) ([place])   . Type 2 diabetes mellitus with right diabetic foot ulcer ([place])   . Post-operative pain   . Legally blind   . Upper GI bleed   . Streptococcal bacteremia 04/01/2017  .  Hypokalemia 03/30/2017  . Uncontrolled type 2 diabetes mellitus with hyperglycemia, with long-term current use of insulin ([place]) 03/30/2017  . Type 2 diabetes mellitus with peripheral neuropathy ([place]) 03/30/2017  . AKI (acute kidney injury) ([place]) 03/30/2017  . CKD stage 3 due to type 2 diabetes mellitus ([place]) 03/30/2017  .  Sepsis ([place]) 03/30/2017  . Heart murmur 11/22/2016  . Hyperlipidemia associated with type 2 diabetes mellitus ([place]) 11/22/2016  . Obstructive sleep apnea syndrome 11/22/2016  . Essential hypertension 12/17/2012   Janna Arch, PT, DPT   03/25/2019, 1:52 PM  [place] MAIN [facility] SERVICES [address] [place], [place], [address] Phone: [phone]   Fax:  [phone]  Name: DERELL BENT MRN: [id number] Date of Birth: 05-26-1955

## 2019-03-27 ENCOUNTER — Other Ambulatory Visit: Payer: Self-pay

## 2019-03-27 ENCOUNTER — Ambulatory Visit: Payer: Medicare Other

## 2019-03-27 DIAGNOSIS — R2689 Other abnormalities of gait and mobility: Secondary | ICD-10-CM | POA: Diagnosis not present

## 2019-03-27 DIAGNOSIS — S88111S Complete traumatic amputation at level between knee and ankle, right lower leg, sequela: Secondary | ICD-10-CM

## 2019-03-27 DIAGNOSIS — M6281 Muscle weakness (generalized): Secondary | ICD-10-CM

## 2019-03-27 DIAGNOSIS — R2681 Unsteadiness on feet: Secondary | ICD-10-CM

## 2019-03-27 NOTE — Therapy (Signed)
[place] MAIN [facility] SERVICES [address] [place], [place], [address] Phone: [phone]   Fax:  [phone]  Physical Therapy Treatment  Patient Details  Name: Trevor [name] MRN: [id number] Date of Birth: [birth year] Referring Provider (PT): Jamse Arn, MD   Encounter Date: 03/27/2019  PT End of Session - 03/27/19 1410    Visit Number  5    Number of Visits  16    Date for PT Re-Evaluation  05/08/19    Authorization Type  5/10 eval 3/3/ 21    PT Start Time  1345    PT Stop Time  1429    PT Time Calculation (min)  44 min    Equipment Utilized During Treatment  Gait belt;Other (comment)   R prosthesis   Activity Tolerance  Patient limited by fatigue;Patient tolerated treatment well    Behavior During Therapy  [facility] for tasks assessed/performed       Past Medical History:  Diagnosis Date  . Diabetes mellitus without complication ([place])   . Heart murmur   . Hyperlipidemia   . Hypertension   . Sleep apnea     Past Surgical History:  Procedure Laterality Date  . AMPUTATION Right 03/31/2017   Procedure: RIGHT FOOT 1ST AND 2ND RAY AMPUTATION;  Surgeon: Newt Minion, MD;  Location: [place];  Service: Orthopedics;  Laterality: Right;  . AMPUTATION Right 04/01/2017   Procedure: AMPUTATION BELOW KNEE;  Surgeon: Newt Minion, MD;  Location: [place];  Service: Orthopedics;  Laterality: Right;  . COLONOSCOPY WITH PROPOFOL N/A 10/02/2017   Procedure: COLONOSCOPY WITH PROPOFOL;  Surgeon: [name], MD;  Location: [facility] ENDOSCOPY;  Service: Gastroenterology;  Laterality: N/A;  . CORNEAL TRANSPLANT      There were no vitals filed for this visit.  Subjective Assessment - 03/27/19 1347    Subjective  Patient reports he is doing well. No falls or LOB since last session. Mixed up his appointment time and came early.    Pertinent History  Patient returning to PT after multiple hospitalizations for L foot and COVID-19. Received home health  therapy till 02/14/19  . Patient last seen 12/13/2018 in this outpatient clinic. PMH includes DM, heart murmur, HLD, HTN, sleep apnea, cellulitis of L foot, hypomagnesemia, unilateral complete BKA R (2019), stage III CKD, PAD, legally blind, hypokalemia, AKI, and sepsis. Uses wheelchair primarily at home rather than walking with prosthesis.    Limitations  Standing;Walking;House hold activities;Other (comment);Lifting    How long can you sit comfortably?  hours /na/    How long can you stand comfortably?  15 minutes with UE support    How long can you walk comfortably?  walked in the house    Patient Stated Goals  walk better.    Currently in Pain?  No/denies        Vitals at start of session:138/80 pulse 62   Treatment:  Gait:  Ambulate 96 ft with SPC and CGA, implementation of obstacles for negotiation and changing of directions/turning for carryover to patient's natural environment. Cueing for widening BOS, excessive external rotation of LLE noted. Improved ability to negotiate obstacles with repeated lap performance with increased weight shift onto prosthetic limb resulting in improved step length bilaterally.    seated between laps; heat pad to back  TherEx  After lap 1: Upright posture marching 10x each LE straight arm Y raises for core 10x   After lap 2:  step over theraband and back: ER/IR with hip  flexion 10x each LE  posterior to anterior pelvic tilts 10x, very challenging for patient  After lap 3:  BTB hip adduction 15x each LE against PT resistance BTB resisted hamstring curl 15x each LE against PT resistance.   After lap 4;  BTB around knees hip abduction 15x BTB around knees marching 15x each LE ; cues for keeping feet and knee in alignment (LLE)    After lap 5:  BTB around ankles: SLR 15x each LE ; cues for decreasing velocity for improved muscle activation and control of eccentric return BTB around ankles: ER/IR 15x each LE  After lap 6: Large swiss ball forward  trunk roll 10x 10 second holds  Large swiss ball diagonal roll out 10x 10 second holds L, R  Vitals monitored throughout session occasional rest breaks required for fatigue, HR elevation, and Sp02 depression.     Pt educated throughout session about proper posture and technique with exercises. Improved exercise technique, movement at target joints, use of target muscles after min to mod verbal, visual, tactile cues.               PT Education - 03/27/19 1409    Education Details  exercise technique, body mechanics    Person(s) Educated  Patient    Methods  Explanation;Demonstration;Tactile cues;Verbal cues    Comprehension  Verbalized understanding;Returned demonstration;Verbal cues required;Tactile cues required       PT Short Term Goals - 03/13/19 1456      PT SHORT TERM GOAL #1   Title  Patient will be independent in home exercise program to improve strength/mobility for better functional independence with ADLs.    Baseline  3/3 HEP next session    Time  2    Period  Weeks    Status  New    Target Date  03/27/19      PT SHORT TERM GOAL #2   Title  Patient will ambulate for 6 minutes without seated rest break to increase tolerance for 6 minute walk test    Baseline  3/3: only able to ambulate 3 minutes and 59 seconds prior to rest break    Time  2    Period  Weeks    Status  New    Target Date  03/27/19        PT Long Term Goals - 03/13/19 1456      PT LONG TERM GOAL #1   Title  Patient will increase six minute walk test distance to >1000 with LRAD for progression to community ambulator and improve gait ability    Baseline  3/3: 700 ft, terminated early    Time  8    Period  Weeks    Status  New    Target Date  05/08/19      PT LONG TERM GOAL #2   Title  Patient will increase FOTO score to equal to or greater than 57/100 to demonstrate statistically significant improved mobility and quality of life.    Baseline  3/3: 47/100 risk adjusted 43/100    Time   8    Period  Weeks    Status  New    Target Date  05/08/19      PT LONG TERM GOAL #3   Title  Patient will increase Berg Balance score by > 6 points (47/100) to demonstrate decreased fall risk during functional activities.    Baseline  3/3: 41/56    Time  8    Period  Weeks  Status  New    Target Date  05/08/19      PT LONG TERM GOAL #4   Title  Patient will increase BLE gross strength to 4+/5 as to improve functional strength for independent gait, increased standing tolerance and increased ADL ability.    Baseline  3/3: LLE grossly 4-/5 R grossly 3+ hip 4- knee    Time  8    Period  Weeks    Status  New    Target Date  05/08/19            Plan - 03/27/19 1433    Clinical Impression Statement  Patient demonstrates improved weight acceptance on prosthetic limb with repetitive ambulation with increased equality of length of steps bilaterally. Utilization of intervals with ambulation in combination with negotiation of obstacles allowed for decreased back pain and improved quality of ambulation. Patient will benefit from skilled physical therapy to increase stability, strength, and mobility for improved quality of life and independence with ADL and iADLs    Personal Factors and Comorbidities  Age;Comorbidity 3+;Finances;Fitness;Past/Current Experience;Social Background;Time since onset of injury/illness/exacerbation;Transportation    Comorbidities  DM, heart murmur, HLD, HTN, sleep apnea, cellulitis of L foot, hypomagnesemia, unilateral complete BKA R (2019), stage III CKD, PAD, legally blind, hypokalemia, AKI, and sepsis    Examination-Activity Limitations  Bathing;Bed Mobility;Caring for [employer];Locomotion Level;Lift;Stand;Toileting;Transfers    Examination-Participation Restrictions  Church;Cleaning;Community Activity;Driving;Interpersonal Relationship;Laundry;Shop;Meal Prep;Yard Work    [employer]chant [employer] officer   Evolving/Moderate complexity    Rehab Potential  Fair    PT Frequency  2x / week    PT Duration  8 weeks    PT Treatment/Interventions  ADLs/Self Care Home Management;Cryotherapy;Electrical Stimulation;Ultrasound;Traction;Moist Heat;DME Instruction;Gait training;Stair training;Functional mobility training;Therapeutic activities;Therapeutic exercise;Patient/family education;Neuromuscular re-education;Balance training;Prosthetic Training;Wheelchair mobility training;Manual techniques;Manual lymph drainage;Compression bandaging;Taping;Energy conservation;Passive range of motion;Aquatic Therapy;Vestibular    PT Next Visit Plan  give HEP, prone    Consulted and Agree with Plan of Care  Patient       Patient will benefit from skilled therapeutic intervention in order to improve the following deficits and impairments:  Abnormal gait, Decreased activity tolerance, Decreased balance, Decreased knowledge of precautions, Decreased endurance, Decreased knowledge of use of DME, Decreased mobility, Decreased range of motion, Difficulty walking, Decreased strength, Increased edema, Impaired flexibility, Impaired perceived functional ability, Prosthetic Dependency, Postural dysfunction, Improper body mechanics, Pain, Decreased skin integrity, Impaired vision/preception, Impaired sensation  Visit Diagnosis: Other abnormalities of gait and mobility  Muscle weakness (generalized)  Unsteadiness on feet  Unilateral complete BKA, right, sequela ([place])     Problem List Patient Active Problem List   Diagnosis Date Noted  . Elevated lactic acid level   . Diabetic ketoacidosis without coma associated with type 2 diabetes mellitus ([place])   . Cellulitis of left foot 12/17/2018  . Pain due to onychomycosis of toenail of right foot 07/05/2018  . Abnormality of gait 10/12/2017  . Type 2 diabetes mellitus with hyperglycemia, with long-term current use of insulin ([place])   . Flatulence   . Hypomagnesemia   .  Unilateral complete BKA, right, sequela ([place])   . Benign essential HTN   . Hypoalbuminemia due to protein-calorie malnutrition ([place])   . S/P below knee amputation, right ([place]) 04/12/2017  . Acute blood loss anemia   . Other encephalopathy 04/08/2017  . Encephalopathy 04/08/2017  . Altered mental status 04/08/2017  . Labile blood pressure   . Labile blood glucose   . Drug induced constipation   . Stage 3 chronic kidney disease   .  Bacteremia   . S/P unilateral BKA (below knee amputation), right ([place]) 04/04/2017  . PAD (peripheral artery disease) ([place])   . Type 2 diabetes mellitus with right diabetic foot ulcer ([place])   . Post-operative pain   . Legally blind   . Upper GI bleed   . Streptococcal bacteremia 04/01/2017  . Hypokalemia 03/30/2017  . Uncontrolled type 2 diabetes mellitus with hyperglycemia, with long-term current use of insulin ([place]) 03/30/2017  . Type 2 diabetes mellitus with peripheral neuropathy ([place]) 03/30/2017  . AKI (acute kidney injury) ([place]) 03/30/2017  . CKD stage 3 due to type 2 diabetes mellitus ([place]) 03/30/2017  . Sepsis ([place]) 03/30/2017  . Heart murmur 11/22/2016  . Hyperlipidemia associated with type 2 diabetes mellitus ([place]) 11/22/2016  . Obstructive sleep apnea syndrome 11/22/2016  . Essential hypertension 12/17/2012   Janna Arch, PT, DPT   03/27/2019, 2:35 PM  [place] MAIN [facility] SERVICES [address] [place], [place], [address] Phone: [phone]   Fax:  [phone]  Name: Trevor [name] MRN: [id number] Date of Birth: 1955/05/15

## 2019-04-01 ENCOUNTER — Ambulatory Visit: Payer: Medicare Other

## 2019-04-03 ENCOUNTER — Ambulatory Visit: Payer: Medicare Other

## 2019-04-03 ENCOUNTER — Other Ambulatory Visit: Payer: Self-pay

## 2019-04-03 DIAGNOSIS — R2681 Unsteadiness on feet: Secondary | ICD-10-CM

## 2019-04-03 DIAGNOSIS — R2689 Other abnormalities of gait and mobility: Secondary | ICD-10-CM | POA: Diagnosis not present

## 2019-04-03 DIAGNOSIS — M6281 Muscle weakness (generalized): Secondary | ICD-10-CM

## 2019-04-03 DIAGNOSIS — S88111S Complete traumatic amputation at level between knee and ankle, right lower leg, sequela: Secondary | ICD-10-CM

## 2019-04-03 NOTE — Therapy (Signed)
[place] MAIN [facility] SERVICES [address] [place], [place], [address] Phone: [phone]   Fax:  [phone]  Physical Therapy Treatment  Patient Details  Name: Trevor [name] MRN: [id number] Date of Birth: 10-22-1955 Referring Provider (PT): Jamse Arn, MD   Encounter Date: 04/03/2019  PT End of Session - 04/03/19 1448    Visit Number  6    Number of Visits  16    Date for PT Re-Evaluation  05/08/19    Authorization Type  6/10 eval 3/3/ 21    PT Start Time  1302    PT Stop Time  1345    PT Time Calculation (min)  43 min    Equipment Utilized During Treatment  Gait belt;Other (comment)   R prosthesis   Activity Tolerance  Patient limited by fatigue;Patient tolerated treatment well    Behavior During Therapy  [facility] for tasks assessed/performed       Past Medical History:  Diagnosis Date  . Diabetes mellitus without complication ([place])   . Heart murmur   . Hyperlipidemia   . Hypertension   . Sleep apnea     Past Surgical History:  Procedure Laterality Date  . AMPUTATION Right 03/31/2017   Procedure: RIGHT FOOT 1ST AND 2ND RAY AMPUTATION;  Surgeon: Newt Minion, MD;  Location: [place];  Service: Orthopedics;  Laterality: Right;  . AMPUTATION Right 04/01/2017   Procedure: AMPUTATION BELOW KNEE;  Surgeon: Newt Minion, MD;  Location: [place];  Service: Orthopedics;  Laterality: Right;  . COLONOSCOPY WITH PROPOFOL N/A 10/02/2017   Procedure: COLONOSCOPY WITH PROPOFOL;  Surgeon: [name], MD;  Location: [facility] ENDOSCOPY;  Service: Gastroenterology;  Laterality: N/A;  . CORNEAL TRANSPLANT      There were no vitals filed for this visit.  Subjective Assessment - 04/03/19 1309    Subjective  Patient missed last session due to running late. Reports no falls or LOB since last session. Reports foot has been holding up well, sees dermatology weekly for it.    Pertinent History  Patient returning to PT after multiple hospitalizations  for L foot and COVID-19. Received home health therapy till 02/14/19  . Patient last seen 12/13/2018 in this outpatient clinic. PMH includes DM, heart murmur, HLD, HTN, sleep apnea, cellulitis of L foot, hypomagnesemia, unilateral complete BKA R (2019), stage III CKD, PAD, legally blind, hypokalemia, AKI, and sepsis. Uses wheelchair primarily at home rather than walking with prosthesis.    Limitations  Standing;Walking;House hold activities;Other (comment);Lifting    How long can you sit comfortably?  hours /na/    How long can you stand comfortably?  15 minutes with UE support    How long can you walk comfortably?  walked in the house    Patient Stated Goals  walk better.    Currently in Pain?  No/denies           Vitals at start of session 136/64  pulse 64    Treat Ambulate > 1000 ft with SPC and single seated rest break; cueing for upright posture, step length. Hip extension limited bilaterally with resultant increase in low back pain with prolonged ambulation. Close CGA    Standing at stair well: airex pad: one foot on airex pad one foot on 6" step 30 second holds x2 trials each LE; more challenging with stabilization of prosthetic limb   airex pad: side step 6" step SUE support 15x each LE   Sit to stand with 2000 gr  weighted ball press 10x from raised plinth table, very challenging on last few exercises.   6" step step ups 12x each LE, BUE support; very fatiguing with repetition   Seated hamstring stretch 2x 60 seconds each LE , leg on PT shoulder  Seated BTB abduction 15x each LE  Seated BTB around ankles, ER/IR 12x each side alternating sides.     Pt educated throughout session about proper posture and technique with exercises. Improved exercise technique, movement at target joints, use of target muscles after min to mod verbal, visual, tactile cues.                     PT Education - 04/03/19 1448    Education Details  exercise technique, body mechanics     Person(s) Educated  Patient    Methods  Explanation;Demonstration;Tactile cues;Verbal cues    Comprehension  Verbalized understanding;Returned demonstration;Verbal cues required;Tactile cues required       PT Short Term Goals - 03/13/19 1456      PT SHORT TERM GOAL #1   Title  Patient will be independent in home exercise program to improve strength/mobility for better functional independence with ADLs.    Baseline  3/3 HEP next session    Time  2    Period  Weeks    Status  New    Target Date  03/27/19      PT SHORT TERM GOAL #2   Title  Patient will ambulate for 6 minutes without seated rest break to increase tolerance for 6 minute walk test    Baseline  3/3: only able to ambulate 3 minutes and 59 seconds prior to rest break    Time  2    Period  Weeks    Status  New    Target Date  03/27/19        PT Long Term Goals - 03/13/19 1456      PT LONG TERM GOAL #1   Title  Patient will increase six minute walk test distance to >1000 with LRAD for progression to community ambulator and improve gait ability    Baseline  3/3: 700 ft, terminated early    Time  8    Period  Weeks    Status  New    Target Date  05/08/19      PT LONG TERM GOAL #2   Title  Patient will increase FOTO score to equal to or greater than 57/100 to demonstrate statistically significant improved mobility and quality of life.    Baseline  3/3: 47/100 risk adjusted 43/100    Time  8    Period  Weeks    Status  New    Target Date  05/08/19      PT LONG TERM GOAL #3   Title  Patient will increase Berg Balance score by > 6 points (47/100) to demonstrate decreased fall risk during functional activities.    Baseline  3/3: 41/56    Time  8    Period  Weeks    Status  New    Target Date  05/08/19      PT LONG TERM GOAL #4   Title  Patient will increase BLE gross strength to 4+/5 as to improve functional strength for independent gait, increased standing tolerance and increased ADL ability.    Baseline  3/3:  LLE grossly 4-/5 R grossly 3+ hip 4- knee    Time  8    Period  Weeks  Status  New    Target Date  05/08/19            Plan - 04/03/19 1450    Clinical Impression Statement  Patient demonstrated improved ambulatory capacity, was able to perform a longer duration ambulation prior to needing to sit this session. Patient challenged with upright posture due to back pain. Patient is more challenged with prosthetic limb on unstable surfaces this session due to fatigue after ambulation. Patient will benefit from skilled physical therapy to increase stability, strength, and mobility for improved quality of life and independence with ADL and iADLs    Personal Factors and Comorbidities  Age;Comorbidity 3+;Finances;Fitness;Past/Current Experience;Social Background;Time since onset of injury/illness/exacerbation;Transportation    Comorbidities  DM, heart murmur, HLD, HTN, sleep apnea, cellulitis of L foot, hypomagnesemia, unilateral complete BKA R (2019), stage III CKD, PAD, legally blind, hypokalemia, AKI, and sepsis    Examination-Activity Limitations  Bathing;Bed Mobility;Caring for [employer];Locomotion Level;Lift;Stand;Toileting;Transfers    Examination-Participation Restrictions  Church;Cleaning;Community Activity;Driving;Interpersonal Relationship;Laundry;Shop;Meal Prep;Yard Work    [employer]chant [employer] officer  Evolving/Moderate complexity    Rehab Potential  Fair    PT Frequency  2x / week    PT Duration  8 weeks    PT Treatment/Interventions  ADLs/Self Care Home Management;Cryotherapy;Electrical Stimulation;Ultrasound;Traction;Moist Heat;DME Instruction;Gait training;Stair training;Functional mobility training;Therapeutic activities;Therapeutic exercise;Patient/family education;Neuromuscular re-education;Balance training;Prosthetic Training;Wheelchair mobility training;Manual techniques;Manual lymph drainage;Compression bandaging;Taping;Energy  conservation;Passive range of motion;Aquatic Therapy;Vestibular    PT Next Visit Plan  give HEP, prone    Consulted and Agree with Plan of Care  Patient       Patient will benefit from skilled therapeutic intervention in order to improve the following deficits and impairments:  Abnormal gait, Decreased activity tolerance, Decreased balance, Decreased knowledge of precautions, Decreased endurance, Decreased knowledge of use of DME, Decreased mobility, Decreased range of motion, Difficulty walking, Decreased strength, Increased edema, Impaired flexibility, Impaired perceived functional ability, Prosthetic Dependency, Postural dysfunction, Improper body mechanics, Pain, Decreased skin integrity, Impaired vision/preception, Impaired sensation  Visit Diagnosis: Other abnormalities of gait and mobility  Muscle weakness (generalized)  Unsteadiness on feet  Unilateral complete BKA, right, sequela ([place])     Problem List Patient Active Problem List   Diagnosis Date Noted  . Elevated lactic acid level   . Diabetic ketoacidosis without coma associated with type 2 diabetes mellitus ([place])   . Cellulitis of left foot 12/17/2018  . Pain due to onychomycosis of toenail of right foot 07/05/2018  . Abnormality of gait 10/12/2017  . Type 2 diabetes mellitus with hyperglycemia, with long-term current use of insulin ([place])   . Flatulence   . Hypomagnesemia   . Unilateral complete BKA, right, sequela ([place])   . Benign essential HTN   . Hypoalbuminemia due to protein-calorie malnutrition ([place])   . S/P below knee amputation, right ([place]) 04/12/2017  . Acute blood loss anemia   . Other encephalopathy 04/08/2017  . Encephalopathy 04/08/2017  . Altered mental status 04/08/2017  . Labile blood pressure   . Labile blood glucose   . Drug induced constipation   . Stage 3 chronic kidney disease   . Bacteremia   . S/P unilateral BKA (below knee amputation), right ([place]) 04/04/2017  . PAD (peripheral artery  disease) ([place])   . Type 2 diabetes mellitus with right diabetic foot ulcer ([place])   . Post-operative pain   . Legally blind   . Upper GI bleed   . Streptococcal bacteremia 04/01/2017  . Hypokalemia 03/30/2017  . Uncontrolled type 2 diabetes mellitus with hyperglycemia,  with long-term current use of insulin ([place]) 03/30/2017  . Type 2 diabetes mellitus with peripheral neuropathy ([place]) 03/30/2017  . AKI (acute kidney injury) ([place]) 03/30/2017  . CKD stage 3 due to type 2 diabetes mellitus ([place]) 03/30/2017  . Sepsis ([place]) 03/30/2017  . Heart murmur 11/22/2016  . Hyperlipidemia associated with type 2 diabetes mellitus ([place]) 11/22/2016  . Obstructive sleep apnea syndrome 11/22/2016  . Essential hypertension 12/17/2012   Janna Arch, PT, DPT   04/03/2019, 2:52 PM  [place] MAIN [facility] SERVICES [address] [place], [place], [address] Phone: [phone]   Fax:  [phone]  Name: Trevor [name] MRN: [id number] Date of Birth: 1955-06-08

## 2019-04-08 ENCOUNTER — Ambulatory Visit: Payer: Medicare Other

## 2019-04-08 ENCOUNTER — Other Ambulatory Visit: Payer: Self-pay

## 2019-04-08 DIAGNOSIS — M6281 Muscle weakness (generalized): Secondary | ICD-10-CM

## 2019-04-08 DIAGNOSIS — R2689 Other abnormalities of gait and mobility: Secondary | ICD-10-CM | POA: Diagnosis not present

## 2019-04-08 DIAGNOSIS — S88111S Complete traumatic amputation at level between knee and ankle, right lower leg, sequela: Secondary | ICD-10-CM

## 2019-04-08 DIAGNOSIS — R2681 Unsteadiness on feet: Secondary | ICD-10-CM

## 2019-04-08 NOTE — Therapy (Signed)
[place] MAIN [facility] SERVICES [address] [place], [place], [address] Phone: [phone]   Fax:  [phone]  Physical Therapy Treatment  Patient Details  Name: Trevor [name] MRN: [id number] Date of Birth: 1955/02/17 Referring Provider (PT): Jamse Arn, MD   Encounter Date: 04/08/2019  PT End of Session - 04/08/19 1330    Visit Number  7    Number of Visits  16    Date for PT Re-Evaluation  05/08/19    Authorization Type  7/10 eval 3/3/ 21    PT Start Time  1300    PT Stop Time  1340    PT Time Calculation (min)  40 min    Equipment Utilized During Treatment  Gait belt;Other (comment)   R prosthesis   Activity Tolerance  Patient limited by fatigue;Patient tolerated treatment well    Behavior During Therapy  [facility] for tasks assessed/performed       Past Medical History:  Diagnosis Date  . Diabetes mellitus without complication ([place])   . Heart murmur   . Hyperlipidemia   . Hypertension   . Sleep apnea     Past Surgical History:  Procedure Laterality Date  . AMPUTATION Right 03/31/2017   Procedure: RIGHT FOOT 1ST AND 2ND RAY AMPUTATION;  Surgeon: Newt Minion, MD;  Location: [place];  Service: Orthopedics;  Laterality: Right;  . AMPUTATION Right 04/01/2017   Procedure: AMPUTATION BELOW KNEE;  Surgeon: Newt Minion, MD;  Location: [place];  Service: Orthopedics;  Laterality: Right;  . COLONOSCOPY WITH PROPOFOL N/A 10/02/2017   Procedure: COLONOSCOPY WITH PROPOFOL;  Surgeon: [name], MD;  Location: [facility] ENDOSCOPY;  Service: Gastroenterology;  Laterality: N/A;  . CORNEAL TRANSPLANT      There were no vitals filed for this visit.  Subjective Assessment - 04/08/19 1305    Subjective  Patient reports no falls or LOB since last session. Has been up and moving, occasionally compliant with HEP    Pertinent History  Patient returning to PT after multiple hospitalizations for L foot and COVID-19. Received home health therapy till  02/14/19  . Patient last seen 12/13/2018 in this outpatient clinic. PMH includes DM, heart murmur, HLD, HTN, sleep apnea, cellulitis of L foot, hypomagnesemia, unilateral complete BKA R (2019), stage III CKD, PAD, legally blind, hypokalemia, AKI, and sepsis. Uses wheelchair primarily at home rather than walking with prosthesis.    Limitations  Standing;Walking;House hold activities;Other (comment);Lifting    How long can you sit comfortably?  hours /na/    How long can you stand comfortably?  15 minutes with UE support    How long can you walk comfortably?  walked in the house    Patient Stated Goals  walk better.    Currently in Pain?  No/denies            Vitals at start of session: 128/72 pulse 58     Treatment:  Gait:  Ambulate 96 ft with SPC and CGA, implementation of obstacles for negotiation and changing of directions/turning for carryover to patient's natural environment. Cueing for widening BOS, excessive external rotation of LLE noted. Improved ability to negotiate obstacles with repeated lap performance with increased weight shift onto prosthetic limb resulting in improved step length bilaterally.   seated between laps; heat pad to back  TherEx   After lap 1: Upright posture marching 10x each LE straight arm Y raises for core 10x    After lap 2:  step over theraband  and back: ER/IR with hip flexion 10x each LE  posterior to anterior pelvic tilts 10x, very challenging for patient   After lap 3:  BTB hip adduction 15x each LE against PT resistance BTB resisted hamstring curl 15x each LE against PT resistance.    After lap 4;  BTB around knees hip abduction 15x BTB around knees marching 15x each LE ; cues for keeping feet and knee in alignment (LLE)     After lap 5:  BTB around ankles: LAQ15x each LE ; cues for decreasing velocity for improved muscle activation and control of eccentric return BTB around ankles: ER/IR 15x each LE   After lap 6: Heel toe raise LLE  15x glute squeezes 15x 3 second holds    Vitals monitored throughout session occasional rest breaks required for fatigue, HR elevation, and Sp02 depression.    Pt educated throughout session about proper posture and technique with exercises. Improved exercise technique, movement at target joints, use of target muscles after min to mod verbal, visual, tactile cues.                    PT Education - 04/08/19 1306    Education Details  exercise technique, bodymechanics    Person(s) Educated  Patient    Methods  Explanation;Demonstration;Tactile cues;Verbal cues    Comprehension  Verbalized understanding;Returned demonstration;Verbal cues required;Tactile cues required       PT Short Term Goals - 03/13/19 1456      PT SHORT TERM GOAL #1   Title  Patient will be independent in home exercise program to improve strength/mobility for better functional independence with ADLs.    Baseline  3/3 HEP next session    Time  2    Period  Weeks    Status  New    Target Date  03/27/19      PT SHORT TERM GOAL #2   Title  Patient will ambulate for 6 minutes without seated rest break to increase tolerance for 6 minute walk test    Baseline  3/3: only able to ambulate 3 minutes and 59 seconds prior to rest break    Time  2    Period  Weeks    Status  New    Target Date  03/27/19        PT Long Term Goals - 03/13/19 1456      PT LONG TERM GOAL #1   Title  Patient will increase six minute walk test distance to >1000 with LRAD for progression to community ambulator and improve gait ability    Baseline  3/3: 700 ft, terminated early    Time  8    Period  Weeks    Status  New    Target Date  05/08/19      PT LONG TERM GOAL #2   Title  Patient will increase FOTO score to equal to or greater than 57/100 to demonstrate statistically significant improved mobility and quality of life.    Baseline  3/3: 47/100 risk adjusted 43/100    Time  8    Period  Weeks    Status  New     Target Date  05/08/19      PT LONG TERM GOAL #3   Title  Patient will increase Berg Balance score by > 6 points (47/100) to demonstrate decreased fall risk during functional activities.    Baseline  3/3: 41/56    Time  8    Period  Weeks  Status  New    Target Date  05/08/19      PT LONG TERM GOAL #4   Title  Patient will increase BLE gross strength to 4+/5 as to improve functional strength for independent gait, increased standing tolerance and increased ADL ability.    Baseline  3/3: LLE grossly 4-/5 R grossly 3+ hip 4- knee    Time  8    Period  Weeks    Status  New    Target Date  05/08/19            Plan - 04/08/19 1333    Clinical Impression Statement  Patient presents with good motivation to physical therapy session. Superset's of ambulation with SPC and seated strengthening interventions performed with patient  Fatigued by end of session. Back pain limits patient prolonged ambulation however intermittent seated rest breaks with interventions allow for continuation of tasks. Patient will benefit from skilled physical therapy to increase stability, strength, and mobility for improved quality of life and independence with ADL and iADLs    Personal Factors and Comorbidities  Age;Comorbidity 3+;Finances;Fitness;Past/Current Experience;Social Background;Time since onset of injury/illness/exacerbation;Transportation    Comorbidities  DM, heart murmur, HLD, HTN, sleep apnea, cellulitis of L foot, hypomagnesemia, unilateral complete BKA R (2019), stage III CKD, PAD, legally blind, hypokalemia, AKI, and sepsis    Examination-Activity Limitations  Bathing;Bed Mobility;Caring for [employer];Locomotion Level;Lift;Stand;Toileting;Transfers    Examination-Participation Restrictions  Church;Cleaning;Community Activity;Driving;Interpersonal Relationship;Laundry;Shop;Meal Prep;Yard Work    [employer]chant [employer] officer  Evolving/Moderate  complexity    Rehab Potential  Fair    PT Frequency  2x / week    PT Duration  8 weeks    PT Treatment/Interventions  ADLs/Self Care Home Management;Cryotherapy;Electrical Stimulation;Ultrasound;Traction;Moist Heat;DME Instruction;Gait training;Stair training;Functional mobility training;Therapeutic activities;Therapeutic exercise;Patient/family education;Neuromuscular re-education;Balance training;Prosthetic Training;Wheelchair mobility training;Manual techniques;Manual lymph drainage;Compression bandaging;Taping;Energy conservation;Passive range of motion;Aquatic Therapy;Vestibular    PT Next Visit Plan  give HEP, prone    Consulted and Agree with Plan of Care  Patient       Patient will benefit from skilled therapeutic intervention in order to improve the following deficits and impairments:  Abnormal gait, Decreased activity tolerance, Decreased balance, Decreased knowledge of precautions, Decreased endurance, Decreased knowledge of use of DME, Decreased mobility, Decreased range of motion, Difficulty walking, Decreased strength, Increased edema, Impaired flexibility, Impaired perceived functional ability, Prosthetic Dependency, Postural dysfunction, Improper body mechanics, Pain, Decreased skin integrity, Impaired vision/preception, Impaired sensation  Visit Diagnosis: Other abnormalities of gait and mobility  Muscle weakness (generalized)  Unsteadiness on feet  Unilateral complete BKA, right, sequela ([place])     Problem List Patient Active Problem List   Diagnosis Date Noted  . Elevated lactic acid level   . Diabetic ketoacidosis without coma associated with type 2 diabetes mellitus ([place])   . Cellulitis of left foot 12/17/2018  . Pain due to onychomycosis of toenail of right foot 07/05/2018  . Abnormality of gait 10/12/2017  . Type 2 diabetes mellitus with hyperglycemia, with long-term current use of insulin ([place])   . Flatulence   . Hypomagnesemia   . Unilateral complete BKA,  right, sequela ([place])   . Benign essential HTN   . Hypoalbuminemia due to protein-calorie malnutrition ([place])   . S/P below knee amputation, right ([place]) 04/12/2017  . Acute blood loss anemia   . Other encephalopathy 04/08/2017  . Encephalopathy 04/08/2017  . Altered mental status 04/08/2017  . Labile blood pressure   . Labile blood glucose   . Drug induced constipation   .  Stage 3 chronic kidney disease   . Bacteremia   . S/P unilateral BKA (below knee amputation), right ([place]) 04/04/2017  . PAD (peripheral artery disease) ([place])   . Type 2 diabetes mellitus with right diabetic foot ulcer ([place])   . Post-operative pain   . Legally blind   . Upper GI bleed   . Streptococcal bacteremia 04/01/2017  . Hypokalemia 03/30/2017  . Uncontrolled type 2 diabetes mellitus with hyperglycemia, with long-term current use of insulin ([place]) 03/30/2017  . Type 2 diabetes mellitus with peripheral neuropathy ([place]) 03/30/2017  . AKI (acute kidney injury) ([place]) 03/30/2017  . CKD stage 3 due to type 2 diabetes mellitus ([place]) 03/30/2017  . Sepsis ([place]) 03/30/2017  . Heart murmur 11/22/2016  . Hyperlipidemia associated with type 2 diabetes mellitus ([place]) 11/22/2016  . Obstructive sleep apnea syndrome 11/22/2016  . Essential hypertension 12/17/2012   Janna Arch, PT, DPT   04/08/2019, 1:41 PM  [place] MAIN [facility] SERVICES [address] [place], [place], [address] Phone: [phone]   Fax:  [phone]  Name: Trevor [name] MRN: [id number] Date of Birth: 07-Jan-1956

## 2019-04-10 ENCOUNTER — Other Ambulatory Visit: Payer: Self-pay

## 2019-04-10 ENCOUNTER — Ambulatory Visit: Payer: Medicare Other

## 2019-04-10 DIAGNOSIS — R2681 Unsteadiness on feet: Secondary | ICD-10-CM

## 2019-04-10 DIAGNOSIS — M6281 Muscle weakness (generalized): Secondary | ICD-10-CM

## 2019-04-10 DIAGNOSIS — R2689 Other abnormalities of gait and mobility: Secondary | ICD-10-CM

## 2019-04-10 DIAGNOSIS — S88111S Complete traumatic amputation at level between knee and ankle, right lower leg, sequela: Secondary | ICD-10-CM

## 2019-04-10 NOTE — Therapy (Signed)
[place] MAIN [facility] SERVICES [address] [place], [place], [address] Phone: [phone]   Fax:  [phone]  Physical Therapy Treatment  Patient Details  Name: Trevor [name] MRN: [id number] Date of Birth: [birth year] Referring Provider (PT): Jamse Arn, MD   Encounter Date: 04/10/2019  PT End of Session - 04/10/19 1424    Visit Number  8    Number of Visits  16    Date for PT Re-Evaluation  05/08/19    Authorization Type  8/10 eval 3/3/ 21    PT Start Time  1350    PT Stop Time  1430    PT Time Calculation (min)  40 min    Equipment Utilized During Treatment  Gait belt;Other (comment)   R prosthesis   Activity Tolerance  Patient limited by fatigue;Patient tolerated treatment well    Behavior During Therapy  [facility] for tasks assessed/performed       Past Medical History:  Diagnosis Date  . Diabetes mellitus without complication ([place])   . Heart murmur   . Hyperlipidemia   . Hypertension   . Sleep apnea     Past Surgical History:  Procedure Laterality Date  . AMPUTATION Right 03/31/2017   Procedure: RIGHT FOOT 1ST AND 2ND RAY AMPUTATION;  Surgeon: Newt Minion, MD;  Location: [place];  Service: Orthopedics;  Laterality: Right;  . AMPUTATION Right 04/01/2017   Procedure: AMPUTATION BELOW KNEE;  Surgeon: Newt Minion, MD;  Location: [place];  Service: Orthopedics;  Laterality: Right;  . COLONOSCOPY WITH PROPOFOL N/A 10/02/2017   Procedure: COLONOSCOPY WITH PROPOFOL;  Surgeon: [name], MD;  Location: [facility] ENDOSCOPY;  Service: Gastroenterology;  Laterality: N/A;  . CORNEAL TRANSPLANT      There were no vitals filed for this visit.  Subjective Assessment - 04/10/19 1409    Subjective  Patient reports his L foot is healing well. No falls or LOB since last session.    Pertinent History  Patient returning to PT after multiple hospitalizations for L foot and COVID-19. Received home health therapy till 02/14/19  . Patient last seen  12/13/2018 in this outpatient clinic. PMH includes DM, heart murmur, HLD, HTN, sleep apnea, cellulitis of L foot, hypomagnesemia, unilateral complete BKA R (2019), stage III CKD, PAD, legally blind, hypokalemia, AKI, and sepsis. Uses wheelchair primarily at home rather than walking with prosthesis.    Limitations  Standing;Walking;House hold activities;Other (comment);Lifting    How long can you sit comfortably?  hours /na/    How long can you stand comfortably?  15 minutes with UE support    How long can you walk comfortably?  walked in the house    Patient Stated Goals  walk better.    Currently in Pain?  No/denies              Vitals at start of session 127/78  pulse 65    Treat Ambulate > 1400 ft with SPC and single seated rest break; cueing for upright posture, step length. Hip extension limited bilaterally with resultant increase in low back pain with prolonged ambulation. Close CGA    Standing at stair well: airex pad: one foot on airex pad one foot on 6" step 60 second holds x2 trials each LE; more challenging with stabilization of prosthetic limb    airex pad: side step 6" step SUE support 15x each LE   6" step step ups 12x each LE, SUE to no UE support support; very fatiguing  with repetition    Seated hamstring stretch 2x 60 seconds each LE , leg on PT shoulder   Seated BTB abduction 15x each LE  Seated BTB around ankles, ER/IR 12x each side alternating sides.   Seated adduction ball squeeze 15x each LE    Seated TrA activation 10x 3 second holds   Pt educated throughout session about proper posture and technique with exercises. Improved exercise technique, movement at target joints, use of target muscles after min to mod verbal, visual, tactile cues.                   PT Education - 04/10/19 1422    Education Details  exercise technique, body mechanics    Person(s) Educated  Patient    Methods  Explanation;Demonstration;Tactile cues;Verbal cues     Comprehension  Verbalized understanding;Returned demonstration;Verbal cues required;Tactile cues required       PT Short Term Goals - 03/13/19 1456      PT SHORT TERM GOAL #1   Title  Patient will be independent in home exercise program to improve strength/mobility for better functional independence with ADLs.    Baseline  3/3 HEP next session    Time  2    Period  Weeks    Status  New    Target Date  03/27/19      PT SHORT TERM GOAL #2   Title  Patient will ambulate for 6 minutes without seated rest break to increase tolerance for 6 minute walk test    Baseline  3/3: only able to ambulate 3 minutes and 59 seconds prior to rest break    Time  2    Period  Weeks    Status  New    Target Date  03/27/19        PT Long Term Goals - 03/13/19 1456      PT LONG TERM GOAL #1   Title  Patient will increase six minute walk test distance to >1000 with LRAD for progression to community ambulator and improve gait ability    Baseline  3/3: 700 ft, terminated early    Time  8    Period  Weeks    Status  New    Target Date  05/08/19      PT LONG TERM GOAL #2   Title  Patient will increase FOTO score to equal to or greater than 57/100 to demonstrate statistically significant improved mobility and quality of life.    Baseline  3/3: 47/100 risk adjusted 43/100    Time  8    Period  Weeks    Status  New    Target Date  05/08/19      PT LONG TERM GOAL #3   Title  Patient will increase Berg Balance score by > 6 points (47/100) to demonstrate decreased fall risk during functional activities.    Baseline  3/3: 41/56    Time  8    Period  Weeks    Status  New    Target Date  05/08/19      PT LONG TERM GOAL #4   Title  Patient will increase BLE gross strength to 4+/5 as to improve functional strength for independent gait, increased standing tolerance and increased ADL ability.    Baseline  3/3: LLE grossly 4-/5 R grossly 3+ hip 4- knee    Time  8    Period  Weeks    Status  New     Target Date  05/08/19            Plan - 04/10/19 1432    Clinical Impression Statement  Patient presents to physical therapy with excellent motivation. Progression of ambulation with increased duration prior to rest break performed. He continues to require one seated rest break after prolonged ambulation however at this time due to low back pain. Unstable surfaces are challenging for prosthetic limb. Patient will benefit from skilled physical therapy to increase stability, strength, and mobility for improved quality of life and independence with ADL and iADLs    Personal Factors and Comorbidities  Age;Comorbidity 3+;Finances;Fitness;Past/Current Experience;Social Background;Time since onset of injury/illness/exacerbation;Transportation    Comorbidities  DM, heart murmur, HLD, HTN, sleep apnea, cellulitis of L foot, hypomagnesemia, unilateral complete BKA R (2019), stage III CKD, PAD, legally blind, hypokalemia, AKI, and sepsis    Examination-Activity Limitations  Bathing;Bed Mobility;Caring for [employer];Locomotion Level;Lift;Stand;Toileting;Transfers    Examination-Participation Restrictions  Church;Cleaning;Community Activity;Driving;Interpersonal Relationship;Laundry;Shop;Meal Prep;Yard Work    [employer]chant [employer] officer  Evolving/Moderate complexity    Rehab Potential  Fair    PT Frequency  2x / week    PT Duration  8 weeks    PT Treatment/Interventions  ADLs/Self Care Home Management;Cryotherapy;Electrical Stimulation;Ultrasound;Traction;Moist Heat;DME Instruction;Gait training;Stair training;Functional mobility training;Therapeutic activities;Therapeutic exercise;Patient/family education;Neuromuscular re-education;Balance training;Prosthetic Training;Wheelchair mobility training;Manual techniques;Manual lymph drainage;Compression bandaging;Taping;Energy conservation;Passive range of motion;Aquatic Therapy;Vestibular    PT Next Visit Plan   give HEP, prone    Consulted and Agree with Plan of Care  Patient       Patient will benefit from skilled therapeutic intervention in order to improve the following deficits and impairments:  Abnormal gait, Decreased activity tolerance, Decreased balance, Decreased knowledge of precautions, Decreased endurance, Decreased knowledge of use of DME, Decreased mobility, Decreased range of motion, Difficulty walking, Decreased strength, Increased edema, Impaired flexibility, Impaired perceived functional ability, Prosthetic Dependency, Postural dysfunction, Improper body mechanics, Pain, Decreased skin integrity, Impaired vision/preception, Impaired sensation  Visit Diagnosis: Other abnormalities of gait and mobility  Muscle weakness (generalized)  Unsteadiness on feet  Unilateral complete BKA, right, sequela ([place])     Problem List Patient Active Problem List   Diagnosis Date Noted  . Elevated lactic acid level   . Diabetic ketoacidosis without coma associated with type 2 diabetes mellitus ([place])   . Cellulitis of left foot 12/17/2018  . Pain due to onychomycosis of toenail of right foot 07/05/2018  . Abnormality of gait 10/12/2017  . Type 2 diabetes mellitus with hyperglycemia, with long-term current use of insulin ([place])   . Flatulence   . Hypomagnesemia   . Unilateral complete BKA, right, sequela ([place])   . Benign essential HTN   . Hypoalbuminemia due to protein-calorie malnutrition ([place])   . S/P below knee amputation, right ([place]) 04/12/2017  . Acute blood loss anemia   . Other encephalopathy 04/08/2017  . Encephalopathy 04/08/2017  . Altered mental status 04/08/2017  . Labile blood pressure   . Labile blood glucose   . Drug induced constipation   . Stage 3 chronic kidney disease   . Bacteremia   . S/P unilateral BKA (below knee amputation), right ([place]) 04/04/2017  . PAD (peripheral artery disease) ([place])   . Type 2 diabetes mellitus with right diabetic foot ulcer ([place])   .  Post-operative pain   . Legally blind   . Upper GI bleed   . Streptococcal bacteremia 04/01/2017  . Hypokalemia 03/30/2017  . Uncontrolled type 2 diabetes mellitus with hyperglycemia, with long-term current use of insulin ([place]) 03/30/2017  .  Type 2 diabetes mellitus with peripheral neuropathy ([name]) 03/30/2017  . AKI (acute kidney injury) ([place]) 03/30/2017  . CKD stage 3 due to type 2 diabetes mellitus ([place]) 03/30/2017  . Sepsis ([place]) 03/30/2017  . Heart murmur 11/22/2016  . Hyperlipidemia associated with type 2 diabetes mellitus ([place]) 11/22/2016  . Obstructive sleep apnea syndrome 11/22/2016  . Essential hypertension 12/17/2012   Janna Arch, PT, DPT   04/10/2019, 2:35 PM  [place] MAIN [facility] SERVICES [address] [place], [place], [address] Phone: [phone]   Fax:  [phone]  Name: MELQUAN VENABLES MRN: [id number] Date of Birth: [birth year]

## 2019-04-15 ENCOUNTER — Ambulatory Visit: Payer: Medicare Other | Attending: Physical Medicine & Rehabilitation

## 2019-04-15 ENCOUNTER — Other Ambulatory Visit: Payer: Self-pay

## 2019-04-15 DIAGNOSIS — M6281 Muscle weakness (generalized): Secondary | ICD-10-CM

## 2019-04-15 DIAGNOSIS — R2681 Unsteadiness on feet: Secondary | ICD-10-CM | POA: Diagnosis present

## 2019-04-15 DIAGNOSIS — S88111S Complete traumatic amputation at level between knee and ankle, right lower leg, sequela: Secondary | ICD-10-CM | POA: Diagnosis present

## 2019-04-15 DIAGNOSIS — R2689 Other abnormalities of gait and mobility: Secondary | ICD-10-CM | POA: Diagnosis present

## 2019-04-15 NOTE — Therapy (Signed)
[place] MAIN [facility] SERVICES [address] [place], [place], [address] Phone: [phone]   Fax:  [phone]  Physical Therapy Treatment  Patient Details  Name: Trevor [name] MRN: [id number] Date of Birth: 19-Feb-1955 Referring Provider (PT): Jamse Arn, MD   Encounter Date: 04/15/2019  PT End of Session - 04/15/19 1357    Visit Number  9    Number of Visits  16    Date for PT Re-Evaluation  05/08/19    Authorization Type  9/10 eval 3/3/ 21    PT Start Time  1304    PT Stop Time  1340    PT Time Calculation (min)  36 min    Equipment Utilized During Treatment  Gait belt;Other (comment)   R prosthesis   Activity Tolerance  Patient limited by fatigue;Patient tolerated treatment well    Behavior During Therapy  [facility] for tasks assessed/performed       Past Medical History:  Diagnosis Date  . Diabetes mellitus without complication ([place])   . Heart murmur   . Hyperlipidemia   . Hypertension   . Sleep apnea     Past Surgical History:  Procedure Laterality Date  . AMPUTATION Right 03/31/2017   Procedure: RIGHT FOOT 1ST AND 2ND RAY AMPUTATION;  Surgeon: Newt Minion, MD;  Location: [place];  Service: Orthopedics;  Laterality: Right;  . AMPUTATION Right 04/01/2017   Procedure: AMPUTATION BELOW KNEE;  Surgeon: Newt Minion, MD;  Location: [place];  Service: Orthopedics;  Laterality: Right;  . COLONOSCOPY WITH PROPOFOL N/A 10/02/2017   Procedure: COLONOSCOPY WITH PROPOFOL;  Surgeon: [name], MD;  Location: [facility] ENDOSCOPY;  Service: Gastroenterology;  Laterality: N/A;  . CORNEAL TRANSPLANT      There were no vitals filed for this visit.  Subjective Assessment - 04/15/19 1307    Subjective  Patient reports having a good weekend. No falls or LOB since last session. HEP compliance.    Pertinent History  Patient returning to PT after multiple hospitalizations for L foot and COVID-19. Received home health therapy till 02/14/19  . Patient  last seen 12/13/2018 in this outpatient clinic. PMH includes DM, heart murmur, HLD, HTN, sleep apnea, cellulitis of L foot, hypomagnesemia, unilateral complete BKA R (2019), stage III CKD, PAD, legally blind, hypokalemia, AKI, and sepsis. Uses wheelchair primarily at home rather than walking with prosthesis.    Limitations  Standing;Walking;House hold activities;Other (comment);Lifting    How long can you sit comfortably?  hours /na/    How long can you stand comfortably?  15 minutes with UE support    How long can you walk comfortably?  walked in the house    Patient Stated Goals  walk better.    Currently in Pain?  No/denies              Vitals at start of session 138/73  pulse 66   Treat Ambulate ~ 1400 ft with SPC and single seated rest break; cueing for upright posture, step length. Hip extension limited bilaterally with resultant increase in low back pain with prolonged ambulation. Close CGA ; Second trial of ambulation: > 1000 ft with CGA ; one rest break . Cues for upright posture with one episode of near LOB due to fatigue.    Standing at stair well: airex pad: one foot on airex pad one foot on 6" step 60 second holds x2 trials each LE; more challenging with stabilization of prosthetic limb    Seated  hamstring stretch 2x 60 seconds each LE , leg on PT shoulder   Seated BTB abduction 15x each LE  Seated BTB adduction against PT resistance 15x each LE   Pt educated throughout session about proper posture and technique with exercises. Improved exercise technique, movement at target joints, use of target muscles after min to mod verbal, visual, tactile cues.                  PT Education - 04/15/19 1356    Education Details  gait mechanics, body mechanics    Person(s) Educated  Patient    Methods  Explanation;Demonstration;Tactile cues;Verbal cues    Comprehension  Verbalized understanding;Returned demonstration;Verbal cues required;Tactile cues required        PT Short Term Goals - 03/13/19 1456      PT SHORT TERM GOAL #1   Title  Patient will be independent in home exercise program to improve strength/mobility for better functional independence with ADLs.    Baseline  3/3 HEP next session    Time  2    Period  Weeks    Status  New    Target Date  03/27/19      PT SHORT TERM GOAL #2   Title  Patient will ambulate for 6 minutes without seated rest break to increase tolerance for 6 minute walk test    Baseline  3/3: only able to ambulate 3 minutes and 59 seconds prior to rest break    Time  2    Period  Weeks    Status  New    Target Date  03/27/19        PT Long Term Goals - 03/13/19 1456      PT LONG TERM GOAL #1   Title  Patient will increase six minute walk test distance to >1000 with LRAD for progression to community ambulator and improve gait ability    Baseline  3/3: 700 ft, terminated early    Time  8    Period  Weeks    Status  New    Target Date  05/08/19      PT LONG TERM GOAL #2   Title  Patient will increase FOTO score to equal to or greater than 57/100 to demonstrate statistically significant improved mobility and quality of life.    Baseline  3/3: 47/100 risk adjusted 43/100    Time  8    Period  Weeks    Status  New    Target Date  05/08/19      PT LONG TERM GOAL #3   Title  Patient will increase Berg Balance score by > 6 points (47/100) to demonstrate decreased fall risk during functional activities.    Baseline  3/3: 41/56    Time  8    Period  Weeks    Status  New    Target Date  05/08/19      PT LONG TERM GOAL #4   Title  Patient will increase BLE gross strength to 4+/5 as to improve functional strength for independent gait, increased standing tolerance and increased ADL ability.    Baseline  3/3: LLE grossly 4-/5 R grossly 3+ hip 4- knee    Time  8    Period  Weeks    Status  New    Target Date  05/08/19            Plan - 04/15/19 1434    Clinical Impression Statement  Session today  focused on  prolonged ambulation due to patient desire to become community ambulator, especially with the nice weather. Patient requires occasional seated rest breaks not due to fatigue but due to back pain with prolonged ambulation. Session limited due to patient having to leave early for another appointment. Patient will benefit from skilled physical therapy to increase stability, strength, and mobility for improved quality of life and independence with ADL and iADLs    Personal Factors and Comorbidities  Age;Comorbidity 3+;Finances;Fitness;Past/Current Experience;Social Background;Time since onset of injury/illness/exacerbation;Transportation    Comorbidities  DM, heart murmur, HLD, HTN, sleep apnea, cellulitis of L foot, hypomagnesemia, unilateral complete BKA R (2019), stage III CKD, PAD, legally blind, hypokalemia, AKI, and sepsis    Examination-Activity Limitations  Bathing;Bed Mobility;Caring for [employer];Locomotion Level;Lift;Stand;Toileting;Transfers    Examination-Participation Restrictions  Church;Cleaning;Community Activity;Driving;Interpersonal Relationship;Laundry;Shop;Meal Prep;Yard Work    [employer]chant [employer] officer  Evolving/Moderate complexity    Rehab Potential  Fair    PT Frequency  2x / week    PT Duration  8 weeks    PT Treatment/Interventions  ADLs/Self Care Home Management;Cryotherapy;Electrical Stimulation;Ultrasound;Traction;Moist Heat;DME Instruction;Gait training;Stair training;Functional mobility training;Therapeutic activities;Therapeutic exercise;Patient/family education;Neuromuscular re-education;Balance training;Prosthetic Training;Wheelchair mobility training;Manual techniques;Manual lymph drainage;Compression bandaging;Taping;Energy conservation;Passive range of motion;Aquatic Therapy;Vestibular    PT Next Visit Plan  give HEP, prone    Consulted and Agree with Plan of Care  Patient       Patient will benefit from  skilled therapeutic intervention in order to improve the following deficits and impairments:  Abnormal gait, Decreased activity tolerance, Decreased balance, Decreased knowledge of precautions, Decreased endurance, Decreased knowledge of use of DME, Decreased mobility, Decreased range of motion, Difficulty walking, Decreased strength, Increased edema, Impaired flexibility, Impaired perceived functional ability, Prosthetic Dependency, Postural dysfunction, Improper body mechanics, Pain, Decreased skin integrity, Impaired vision/preception, Impaired sensation  Visit Diagnosis: Other abnormalities of gait and mobility  Muscle weakness (generalized)  Unsteadiness on feet  Unilateral complete BKA, right, sequela ([place])     Problem List Patient Active Problem List   Diagnosis Date Noted  . Elevated lactic acid level   . Diabetic ketoacidosis without coma associated with type 2 diabetes mellitus ([place])   . Cellulitis of left foot 12/17/2018  . Pain due to onychomycosis of toenail of right foot 07/05/2018  . Abnormality of gait 10/12/2017  . Type 2 diabetes mellitus with hyperglycemia, with long-term current use of insulin ([place])   . Flatulence   . Hypomagnesemia   . Unilateral complete BKA, right, sequela ([place])   . Benign essential HTN   . Hypoalbuminemia due to protein-calorie malnutrition ([place])   . S/P below knee amputation, right ([place]) 04/12/2017  . Acute blood loss anemia   . Other encephalopathy 04/08/2017  . Encephalopathy 04/08/2017  . Altered mental status 04/08/2017  . Labile blood pressure   . Labile blood glucose   . Drug induced constipation   . Stage 3 chronic kidney disease   . Bacteremia   . S/P unilateral BKA (below knee amputation), right ([place]) 04/04/2017  . PAD (peripheral artery disease) ([place])   . Type 2 diabetes mellitus with right diabetic foot ulcer ([place])   . Post-operative pain   . Legally blind   . Upper GI bleed   . Streptococcal bacteremia 04/01/2017  .  Hypokalemia 03/30/2017  . Uncontrolled type 2 diabetes mellitus with hyperglycemia, with long-term current use of insulin ([place]) 03/30/2017  . Type 2 diabetes mellitus with peripheral neuropathy ([place]) 03/30/2017  . AKI (acute kidney injury) ([place]) 03/30/2017  . CKD stage 3 due  to type 2 diabetes mellitus ([place]) 03/30/2017  . Sepsis ([place]) 03/30/2017  . Heart murmur 11/22/2016  . Hyperlipidemia associated with type 2 diabetes mellitus ([place]) 11/22/2016  . Obstructive sleep apnea syndrome 11/22/2016  . Essential hypertension 12/17/2012   Janna Arch, PT, DPT   04/15/2019, 2:36 PM  [place] MAIN [facility] SERVICES [address] [place], [place], [address] Phone: [phone]   Fax:  [phone]  Name: GEORDIE HAKE MRN: [id number] Date of Birth: [birth year]

## 2019-04-17 ENCOUNTER — Ambulatory Visit: Payer: Medicare Other

## 2019-04-22 ENCOUNTER — Ambulatory Visit: Payer: Medicare Other

## 2019-04-22 ENCOUNTER — Other Ambulatory Visit: Payer: Self-pay

## 2019-04-22 DIAGNOSIS — R2689 Other abnormalities of gait and mobility: Secondary | ICD-10-CM

## 2019-04-22 DIAGNOSIS — S88111S Complete traumatic amputation at level between knee and ankle, right lower leg, sequela: Secondary | ICD-10-CM

## 2019-04-22 DIAGNOSIS — M6281 Muscle weakness (generalized): Secondary | ICD-10-CM

## 2019-04-22 DIAGNOSIS — R2681 Unsteadiness on feet: Secondary | ICD-10-CM

## 2019-04-22 NOTE — Therapy (Signed)
[place] MAIN [facility] SERVICES [address] [place], [place], [address] Phone: [phone]   Fax:  [phone]  Physical Therapy Treatment Physical Therapy Progress Note   Dates of reporting period  03/13/19   to   04/22/19  Patient Details  Name: Trevor [name] MRN: [id number] Date of Birth: 07/17/1955 Referring Provider (PT): Jamse Arn, MD   Encounter Date: 04/22/2019  PT End of Session - 04/22/19 1320    Visit Number  10    Number of Visits  16    Date for PT Re-Evaluation  05/08/19    Authorization Type  10/10 eval 3/3/ 21; next session 1/10 PN 4/12    PT Start Time  1300    PT Stop Time  1340    PT Time Calculation (min)  40 min    Equipment Utilized During Treatment  Gait belt;Other (comment)   R prosthesis   Activity Tolerance  Patient limited by fatigue;Patient tolerated treatment well    Behavior During Therapy  [facility] for tasks assessed/performed       Past Medical History:  Diagnosis Date  . Diabetes mellitus without complication ([place])   . Heart murmur   . Hyperlipidemia   . Hypertension   . Sleep apnea     Past Surgical History:  Procedure Laterality Date  . AMPUTATION Right 03/31/2017   Procedure: RIGHT FOOT 1ST AND 2ND RAY AMPUTATION;  Surgeon: Newt Minion, MD;  Location: [place];  Service: Orthopedics;  Laterality: Right;  . AMPUTATION Right 04/01/2017   Procedure: AMPUTATION BELOW KNEE;  Surgeon: Newt Minion, MD;  Location: [place];  Service: Orthopedics;  Laterality: Right;  . COLONOSCOPY WITH PROPOFOL N/A 10/02/2017   Procedure: COLONOSCOPY WITH PROPOFOL;  Surgeon: [name], MD;  Location: [facility] ENDOSCOPY;  Service: Gastroenterology;  Laterality: N/A;  . CORNEAL TRANSPLANT      There were no vitals filed for this visit.  Subjective Assessment - 04/22/19 1304    Subjective  Patient missed last session. Walked down without aide this session to therapy gym for first time. No falls or LOB since last  session.    Pertinent History  Patient returning to PT after multiple hospitalizations for L foot and COVID-19. Received home health therapy till 02/14/19  . Patient last seen 12/13/2018 in this outpatient clinic. PMH includes DM, heart murmur, HLD, HTN, sleep apnea, cellulitis of L foot, hypomagnesemia, unilateral complete BKA R (2019), stage III CKD, PAD, legally blind, hypokalemia, AKI, and sepsis. Uses wheelchair primarily at home rather than walking with prosthesis.    Limitations  Standing;Walking;House hold activities;Other (comment);Lifting    How long can you sit comfortably?  hours /na/    How long can you stand comfortably?  15 minutes with UE support    How long can you walk comfortably?  walked in the house    Patient Stated Goals  walk better.    Currently in Pain?  Yes    Pain Score  4     Pain Location  Back    Pain Orientation  Lower    Pain Descriptors / Indicators  Aching    Pain Type  Chronic pain         [facility] PT Assessment - 04/22/19 0001      Standardized Balance Assessment   Standardized Balance Assessment  Berg Balance Test      Berg Balance Test   Sit to Stand  Able to stand without using hands and stabilize  independently    Standing Unsupported  Able to stand safely 2 minutes    Sitting with Back Unsupported but Feet Supported on Floor or Stool  Able to sit safely and securely 2 minutes    Stand to Sit  Sits safely with minimal use of hands    Transfers  Able to transfer safely, definite need of hands    Standing Unsupported with Eyes Closed  Able to stand 10 seconds safely    Standing Unsupported with Feet Together  Able to place feet together independently and stand 1 minute safely    From Standing, Reach Forward with Outstretched Arm  Can reach forward >12 cm safely (5")    From Standing Position, Pick up Object from Floor  Able to pick up shoe safely and easily    From Standing Position, Turn to Look Behind Over each Shoulder  Looks behind from both sides  and weight shifts well    Turn 360 Degrees  Able to turn 360 degrees safely one side only in 4 seconds or less    Standing Unsupported, Alternately Place Feet on Step/Stool  Able to stand independently and safely and complete 8 steps in 20 seconds    Standing Unsupported, One Foot in Front  Able to take small step independently and hold 30 seconds    Standing on One Leg  Able to lift leg independently and hold equal to or more than 3 seconds    Total Score  49     Vitals at start of session 139/68   Progress note  6 MWT: deferred to next visit due to walking down/up himself today.  FOTO: 40.9 BERG: 49/56 BLE strength: see goals LEFS 32  4lb ankle weight:  -standing hip extension 10x each LE, cues for keeping knee extended -standing hip abduction 10x each LE, BUE support -seated marching 12x each LE -seated LAQ 12x each LE, cues for full extension/flexion   Patient's condition has the potential to improve in response to therapy. Maximum improvement is yet to be obtained. The anticipated improvement is attainable and reasonable in a generally predictable time.  Patient reports he feels like he is getting better, reports his walking is better still limited by his back pain. Picking up stuff off the ground is better and he is able to tolerate standing for longer.                     PT Education - 04/22/19 1307    Education Details  goals,exercise technique    Person(s) Educated  Patient    Methods  Explanation;Demonstration;Tactile cues;Verbal cues    Comprehension  Verbalized understanding;Returned demonstration;Verbal cues required;Tactile cues required       PT Short Term Goals - 04/22/19 1321      PT SHORT TERM GOAL #1   Title  Patient will be independent in home exercise program to improve strength/mobility for better functional independence with ADLs.    Baseline  3/3 HEP next session 4/12 HEP compliant    Time  2    Period  Weeks    Status  Partially Met     Target Date  05/06/19      PT SHORT TERM GOAL #2   Title  Patient will ambulate for 6 minutes without seated rest break to increase tolerance for 6 minute walk test    Baseline  3/3: only able to ambulate 3 minutes and 59 seconds prior to rest break 4/12 6 min walk test deferred  till next session    Time  2    Period  Weeks    Status  Deferred    Target Date  03/27/19        PT Long Term Goals - 04/22/19 1322      PT LONG TERM GOAL #1   Title  Patient will increase six minute walk test distance to >1000 with LRAD for progression to community ambulator and improve gait ability    Baseline  3/3: 700 ft, terminated early 4/12: deferred due to walking down to PT gym    Time  8    Period  Weeks    Status  Deferred    Target Date  05/08/19      PT LONG TERM GOAL #2   Title  Patient will increase FOTO score to equal to or greater than 57/100 to demonstrate statistically significant improved mobility and quality of life.    Baseline  3/3: 47/100 risk adjusted 43/100 4/12: 41/100    Time  8    Period  Weeks    Status  On-going    Target Date  05/08/19      PT LONG TERM GOAL #3   Title  Patient will increase Berg Balance score by > 6 points (47/100) to demonstrate decreased fall risk during functional activities.    Baseline  3/3: 41/56 4/12; 49/56    Time  8    Period  Weeks    Status  Achieved      PT LONG TERM GOAL #4   Title  Patient will increase BLE gross strength to 4+/5 as to improve functional strength for independent gait, increased standing tolerance and increased ADL ability.    Baseline  3/3: LLE grossly 4-/5 R grossly 3+ hip 4- knee 4/12: LLE grossly 4- to 4/5 with R 4- hip    Time  8    Period  Weeks    Status  Partially Met    Target Date  05/08/19            Plan - 04/22/19 1342    Clinical Impression Statement  Patient demonstrates improved stability increasing his BERG score to 49/56. 6 min walk test deferred to next session due to patient ambulating  down to therapy gym for first time without using a wheelchair. Patient did score lower on FOTO however he reports he feels like walking and his balance is better, and is not sure why he scored lower.  Patient's condition has the potential to improve in response to therapy. Maximum improvement is yet to be obtained. The anticipated improvement is attainable and reasonable in a generally predictable time. Patient will benefit from skilled physical therapy to increase stability, strength, and mobility for improved quality of life and independence with ADL and iADLs    Personal Factors and Comorbidities  Age;Comorbidity 3+;Finances;Fitness;Past/Current Experience;Social Background;Time since onset of injury/illness/exacerbation;Transportation    Comorbidities  DM, heart murmur, HLD, HTN, sleep apnea, cellulitis of L foot, hypomagnesemia, unilateral complete BKA R (2019), stage III CKD, PAD, legally blind, hypokalemia, AKI, and sepsis    Examination-Activity Limitations  Bathing;Bed Mobility;Caring for [employer];Locomotion Level;Lift;Stand;Toileting;Transfers    Examination-Participation Restrictions  Church;Cleaning;Community Activity;Driving;Interpersonal Relationship;Laundry;Shop;Meal Prep;Yard Work    [employer]chant [employer] officer  Evolving/Moderate complexity    Rehab Potential  Fair    PT Frequency  2x / week    PT Duration  8 weeks    PT Treatment/Interventions  ADLs/Self Care Home Management;Cryotherapy;Electrical Stimulation;Ultrasound;Traction;Moist Heat;DME Instruction;Gait training;Stair training;Functional mobility training;Therapeutic  activities;Therapeutic exercise;Patient/family education;Neuromuscular re-education;Balance training;Prosthetic Training;Wheelchair mobility training;Manual techniques;Manual lymph drainage;Compression bandaging;Taping;Energy conservation;Passive range of motion;Aquatic Therapy;Vestibular    PT Next Visit Plan  give  HEP, prone    Consulted and Agree with Plan of Care  Patient       Patient will benefit from skilled therapeutic intervention in order to improve the following deficits and impairments:  Abnormal gait, Decreased activity tolerance, Decreased balance, Decreased knowledge of precautions, Decreased endurance, Decreased knowledge of use of DME, Decreased mobility, Decreased range of motion, Difficulty walking, Decreased strength, Increased edema, Impaired flexibility, Impaired perceived functional ability, Prosthetic Dependency, Postural dysfunction, Improper body mechanics, Pain, Decreased skin integrity, Impaired vision/preception, Impaired sensation  Visit Diagnosis: Other abnormalities of gait and mobility  Muscle weakness (generalized)  Unsteadiness on feet  Unilateral complete BKA, right, sequela ([place])     Problem List Patient Active Problem List   Diagnosis Date Noted  . Elevated lactic acid level   . Diabetic ketoacidosis without coma associated with type 2 diabetes mellitus ([place])   . Cellulitis of left foot 12/17/2018  . Pain due to onychomycosis of toenail of right foot 07/05/2018  . Abnormality of gait 10/12/2017  . Type 2 diabetes mellitus with hyperglycemia, with long-term current use of insulin ([place])   . Flatulence   . Hypomagnesemia   . Unilateral complete BKA, right, sequela ([place])   . Benign essential HTN   . Hypoalbuminemia due to protein-calorie malnutrition ([place])   . S/P below knee amputation, right ([place]) 04/12/2017  . Acute blood loss anemia   . Other encephalopathy 04/08/2017  . Encephalopathy 04/08/2017  . Altered mental status 04/08/2017  . Labile blood pressure   . Labile blood glucose   . Drug induced constipation   . Stage 3 chronic kidney disease   . Bacteremia   . S/P unilateral BKA (below knee amputation), right ([place]) 04/04/2017  . PAD (peripheral artery disease) ([place])   . Type 2 diabetes mellitus with right diabetic foot ulcer ([place])   .  Post-operative pain   . Legally blind   . Upper GI bleed   . Streptococcal bacteremia 04/01/2017  . Hypokalemia 03/30/2017  . Uncontrolled type 2 diabetes mellitus with hyperglycemia, with long-term current use of insulin ([place]) 03/30/2017  . Type 2 diabetes mellitus with peripheral neuropathy ([place]) 03/30/2017  . AKI (acute kidney injury) ([place]) 03/30/2017  . CKD stage 3 due to type 2 diabetes mellitus ([place]) 03/30/2017  . Sepsis ([place]) 03/30/2017  . Heart murmur 11/22/2016  . Hyperlipidemia associated with type 2 diabetes mellitus ([place]) 11/22/2016  . Obstructive sleep apnea syndrome 11/22/2016  . Essential hypertension 12/17/2012   Janna Arch, PT, DPT   04/22/2019, 1:44 PM  [place] MAIN [facility] SERVICES [address] [place], [place], [address] Phone: [phone]   Fax:  [phone]  Name: Trevor [name] MRN: [id number] Date of Birth: 1955-02-09

## 2019-04-24 ENCOUNTER — Other Ambulatory Visit: Payer: Self-pay

## 2019-04-24 ENCOUNTER — Ambulatory Visit: Payer: Medicare Other

## 2019-04-24 DIAGNOSIS — R2689 Other abnormalities of gait and mobility: Secondary | ICD-10-CM | POA: Diagnosis not present

## 2019-04-24 DIAGNOSIS — R2681 Unsteadiness on feet: Secondary | ICD-10-CM

## 2019-04-24 DIAGNOSIS — M6281 Muscle weakness (generalized): Secondary | ICD-10-CM

## 2019-04-24 DIAGNOSIS — S88111S Complete traumatic amputation at level between knee and ankle, right lower leg, sequela: Secondary | ICD-10-CM

## 2019-04-24 NOTE — Therapy (Signed)
[place] MAIN [facility] SERVICES [address] [place], [place], [address] Phone: [phone]   Fax:  [phone]  Physical Therapy Treatment  Patient Details  Name: Trevor [name] MRN: [id number] Date of Birth: 1955-10-20 Referring Provider (PT): Jamse Arn, MD   Encounter Date: 04/24/2019  PT End of Session - 04/24/19 1714    Visit Number  11    Number of Visits  16    Date for PT Re-Evaluation  05/08/19    Authorization Type  1/10 PN 4/12    PT Start Time  1275    PT Stop Time  1345    PT Time Calculation (min)  38 min    Equipment Utilized During Treatment  Gait belt;Other (comment)   R prosthesis   Activity Tolerance  Patient limited by fatigue;Patient tolerated treatment well    Behavior During Therapy  [facility] for tasks assessed/performed       Past Medical History:  Diagnosis Date  . Diabetes mellitus without complication ([place])   . Heart murmur   . Hyperlipidemia   . Hypertension   . Sleep apnea     Past Surgical History:  Procedure Laterality Date  . AMPUTATION Right 03/31/2017   Procedure: RIGHT FOOT 1ST AND 2ND RAY AMPUTATION;  Surgeon: Newt Minion, MD;  Location: [place];  Service: Orthopedics;  Laterality: Right;  . AMPUTATION Right 04/01/2017   Procedure: AMPUTATION BELOW KNEE;  Surgeon: Newt Minion, MD;  Location: [facility];  Service: Orthopedics;  Laterality: Right;  . COLONOSCOPY WITH PROPOFOL N/A 10/02/2017   Procedure: COLONOSCOPY WITH PROPOFOL;  Surgeon: [name], MD;  Location: [facility] ENDOSCOPY;  Service: Gastroenterology;  Laterality: N/A;  . CORNEAL TRANSPLANT      There were no vitals filed for this visit.  Subjective Assessment - 04/24/19 1309    Subjective  Patient late to session this session. No falls or LOB since last session.    Pertinent History  Patient returning to PT after multiple hospitalizations for L foot and COVID-19. Received home health therapy till 02/14/19  . Patient last seen 12/13/2018  in this outpatient clinic. PMH includes DM, heart murmur, HLD, HTN, sleep apnea, cellulitis of L foot, hypomagnesemia, unilateral complete BKA R (2019), stage III CKD, PAD, legally blind, hypokalemia, AKI, and sepsis. Uses wheelchair primarily at home rather than walking with prosthesis.    Limitations  Standing;Walking;House hold activities;Other (comment);Lifting    How long can you sit comfortably?  hours /na/    How long can you stand comfortably?  15 minutes with UE support    How long can you walk comfortably?  walked in the house    Patient Stated Goals  walk better.    Currently in Pain?  No/denies              Vitals at start of session 130/65   6 min walk test: 6 minutes with one seated rest break: 740 ft w SPC   Standing at stair well: airex pad: one foot on airex pad one foot on 6" step 60 second holds x2 trials each LE; more challenging with stabilization of prosthetic limb    Seated hamstring stretch 2x 60 seconds each LE , leg on PT shoulder  Sit to stand 10x with focus on body mechanics and equal weight acceptance.    Seated BTB abduction 15x each LE  Seated BTB adduction against PT resistance 15x each LE  Seated BTB around ankles IR/ER 15x each LE  Pt educated throughout session about proper posture and technique with exercises. Improved exercise technique, movement at target joints, use of target muscles after min to mod verbal, visual, tactile cues.                 PT Education - 04/24/19 1310    Education Details  exercise technique, 6 min walk test    Person(s) Educated  Patient    Methods  Explanation;Demonstration;Tactile cues;Verbal cues    Comprehension  Verbalized understanding;Returned demonstration;Verbal cues required;Tactile cues required       PT Short Term Goals - 04/24/19 1332      PT SHORT TERM GOAL #1   Title  Patient will be independent in home exercise program to improve strength/mobility for better functional  independence with ADLs.    Baseline  3/3 HEP next session 4/12 HEP compliant    Time  2    Period  Weeks    Status  Partially Met    Target Date  05/06/19      PT SHORT TERM GOAL #2   Title  Patient will ambulate for 6 minutes without seated rest break to increase tolerance for 6 minute walk test    Baseline  3/3: only able to ambulate 3 minutes and 59 seconds prior to rest break 4/12 6 min walk test deferred till next session 4/14: 6 minutes with one rest break    Time  2    Period  Weeks    Status  Partially Met    Target Date  03/27/19        PT Long Term Goals - 04/24/19 1331      PT LONG TERM GOAL #1   Title  Patient will increase six minute walk test distance to >1000 with LRAD for progression to community ambulator and improve gait ability    Baseline  3/3: 700 ft, terminated early 4/12: deferred due to walking down to PT gym 4/14: 740 ft 6 minutes with one seated rest break    Time  8    Period  Weeks    Status  Partially Met    Target Date  05/08/19      PT LONG TERM GOAL #2   Title  Patient will increase FOTO score to equal to or greater than 57/100 to demonstrate statistically significant improved mobility and quality of life.    Baseline  3/3: 47/100 risk adjusted 43/100 4/12: 41/100    Time  8    Period  Weeks    Status  On-going    Target Date  05/08/19      PT LONG TERM GOAL #3   Title  Patient will increase Berg Balance score by > 6 points (47/100) to demonstrate decreased fall risk during functional activities.    Baseline  3/3: 41/56 4/12; 49/56    Time  8    Period  Weeks    Status  Achieved      PT LONG TERM GOAL #4   Title  Patient will increase BLE gross strength to 4+/5 as to improve functional strength for independent gait, increased standing tolerance and increased ADL ability.    Baseline  3/3: LLE grossly 4-/5 R grossly 3+ hip 4- knee 4/12: LLE grossly 4- to 4/5 with R 4- hip    Time  8    Period  Weeks    Status  Partially Met    Target  Date  05/08/19  Plan - 04/24/19 1717    Clinical Impression Statement  Patient was able to ambulate the duration of 6 minute walk test with only one seated rest break for first time. Patient reports severe/increase in back pain with prolonged ambulation requiring seated rest break for relief. Patient will benefit from skilled physical therapy to increase stability, strength, and mobility for improved quality of life and independence with ADL and iADLs    Personal Factors and Comorbidities  Age;Comorbidity 3+;Finances;Fitness;Past/Current Experience;Social Background;Time since onset of injury/illness/exacerbation;Transportation    Comorbidities  DM, heart murmur, HLD, HTN, sleep apnea, cellulitis of L foot, hypomagnesemia, unilateral complete BKA R (2019), stage III CKD, PAD, legally blind, hypokalemia, AKI, and sepsis    Examination-Activity Limitations  Bathing;Bed Mobility;Caring for [employer];Locomotion Level;Lift;Stand;Toileting;Transfers    Examination-Participation Restrictions  Church;Cleaning;Community Activity;Driving;Interpersonal Relationship;Laundry;Shop;Meal Prep;Yard Work    Merchant [employer] officer  Evolving/Moderate complexity    Rehab Potential  Fair    PT Frequency  2x / week    PT Duration  8 weeks    PT Treatment/Interventions  ADLs/Self Care Home Management;Cryotherapy;Electrical Stimulation;Ultrasound;Traction;Moist Heat;DME Instruction;Gait training;Stair training;Functional mobility training;Therapeutic activities;Therapeutic exercise;Patient/family education;Neuromuscular re-education;Balance training;Prosthetic Training;Wheelchair mobility training;Manual techniques;Manual lymph drainage;Compression bandaging;Taping;Energy conservation;Passive range of motion;Aquatic Therapy;Vestibular    PT Next Visit Plan  give HEP, prone    Consulted and Agree with Plan of Care  Patient       Patient will benefit  from skilled therapeutic intervention in order to improve the following deficits and impairments:  Abnormal gait, Decreased activity tolerance, Decreased balance, Decreased knowledge of precautions, Decreased endurance, Decreased knowledge of use of DME, Decreased mobility, Decreased range of motion, Difficulty walking, Decreased strength, Increased edema, Impaired flexibility, Impaired perceived functional ability, Prosthetic Dependency, Postural dysfunction, Improper body mechanics, Pain, Decreased skin integrity, Impaired vision/preception, Impaired sensation  Visit Diagnosis: Other abnormalities of gait and mobility  Muscle weakness (generalized)  Unsteadiness on feet  Unilateral complete BKA, right, sequela ([place])     Problem List Patient Active Problem List   Diagnosis Date Noted  . Elevated lactic acid level   . Diabetic ketoacidosis without coma associated with type 2 diabetes mellitus ([place])   . Cellulitis of left foot 12/17/2018  . Pain due to onychomycosis of toenail of right foot 07/05/2018  . Abnormality of gait 10/12/2017  . Type 2 diabetes mellitus with hyperglycemia, with long-term current use of insulin ([place])   . Flatulence   . Hypomagnesemia   . Unilateral complete BKA, right, sequela ([place])   . Benign essential HTN   . Hypoalbuminemia due to protein-calorie malnutrition ([place])   . S/P below knee amputation, right ([place]) 04/12/2017  . Acute blood loss anemia   . Other encephalopathy 04/08/2017  . Encephalopathy 04/08/2017  . Altered mental status 04/08/2017  . Labile blood pressure   . Labile blood glucose   . Drug induced constipation   . Stage 3 chronic kidney disease   . Bacteremia   . S/P unilateral BKA (below knee amputation), right ([place]) 04/04/2017  . PAD (peripheral artery disease) ([place])   . Type 2 diabetes mellitus with right diabetic foot ulcer ([place])   . Post-operative pain   . Legally blind   . Upper GI bleed   . Streptococcal bacteremia 04/01/2017   . Hypokalemia 03/30/2017  . Uncontrolled type 2 diabetes mellitus with hyperglycemia, with long-term current use of insulin ([place]) 03/30/2017  . Type 2 diabetes mellitus with peripheral neuropathy ([place]) 03/30/2017  . AKI (acute kidney injury) ([place]) 03/30/2017  . CKD stage  3 due to type 2 diabetes mellitus ([place]) 03/30/2017  . Sepsis ([place]) 03/30/2017  . Heart murmur 11/22/2016  . Hyperlipidemia associated with type 2 diabetes mellitus ([place]) 11/22/2016  . Obstructive sleep apnea syndrome 11/22/2016  . Essential hypertension 12/17/2012   Janna Arch, PT, DPT   04/24/2019, 5:18 PM  [place] MAIN [facility] SERVICES [address] [place], [place], [address] Phone: [phone]   Fax:  [phone]  Name: Trevor [name] MRN: [id number] Date of Birth: 04-15-1955

## 2019-04-29 ENCOUNTER — Other Ambulatory Visit: Payer: Self-pay

## 2019-04-29 ENCOUNTER — Ambulatory Visit: Payer: Medicare Other

## 2019-04-29 DIAGNOSIS — R2689 Other abnormalities of gait and mobility: Secondary | ICD-10-CM

## 2019-04-29 DIAGNOSIS — R2681 Unsteadiness on feet: Secondary | ICD-10-CM

## 2019-04-29 DIAGNOSIS — M6281 Muscle weakness (generalized): Secondary | ICD-10-CM

## 2019-04-29 DIAGNOSIS — S88111S Complete traumatic amputation at level between knee and ankle, right lower leg, sequela: Secondary | ICD-10-CM

## 2019-04-29 NOTE — Therapy (Signed)
[place] MAIN [facility] SERVICES [address] [place], [place], [address] Phone: [phone]   Fax:  [phone]  Physical Therapy Treatment  Patient Details  Name: Trevor [name] MRN: [id number] Date of Birth: Dec 21, 1955 Referring Provider (PT): Jamse Arn, MD   Encounter Date: 04/29/2019  PT End of Session - 04/29/19 1330    Visit Number  12    Number of Visits  16    Date for PT Re-Evaluation  05/08/19    Authorization Type  2/10 PN 4/12    PT Start Time  9735    PT Stop Time  1340    PT Time Calculation (min)  35 min    Equipment Utilized During Treatment  Gait belt;Other (comment)   R prosthesis   Activity Tolerance  Patient limited by fatigue;Patient tolerated treatment well    Behavior During Therapy  [facility] for tasks assessed/performed       Past Medical History:  Diagnosis Date  . Diabetes mellitus without complication ([place])   . Heart murmur   . Hyperlipidemia   . Hypertension   . Sleep apnea     Past Surgical History:  Procedure Laterality Date  . AMPUTATION Right 03/31/2017   Procedure: RIGHT FOOT 1ST AND 2ND RAY AMPUTATION;  Surgeon: Newt Minion, MD;  Location: [place];  Service: Orthopedics;  Laterality: Right;  . AMPUTATION Right 04/01/2017   Procedure: AMPUTATION BELOW KNEE;  Surgeon: Newt Minion, MD;  Location: [place];  Service: Orthopedics;  Laterality: Right;  . COLONOSCOPY WITH PROPOFOL N/A 10/02/2017   Procedure: COLONOSCOPY WITH PROPOFOL;  Surgeon: [name], MD;  Location: [facility] ENDOSCOPY;  Service: Gastroenterology;  Laterality: N/A;  . CORNEAL TRANSPLANT      There were no vitals filed for this visit.  Subjective Assessment - 04/29/19 1313    Subjective  Patient is late to session, has to leave early for doctor appointment. No falls or LOB since last session.    Pertinent History  Patient returning to PT after multiple hospitalizations for L foot and COVID-19. Received home health therapy till  02/14/19  . Patient last seen 12/13/2018 in this outpatient clinic. PMH includes DM, heart murmur, HLD, HTN, sleep apnea, cellulitis of L foot, hypomagnesemia, unilateral complete BKA R (2019), stage III CKD, PAD, legally blind, hypokalemia, AKI, and sepsis. Uses wheelchair primarily at home rather than walking with prosthesis.    Limitations  Standing;Walking;House hold activities;Other (comment);Lifting    How long can you sit comfortably?  hours /na/    How long can you stand comfortably?  15 minutes with UE support    How long can you walk comfortably?  walked in the house    Patient Stated Goals  walk better.    Currently in Pain?  No/denies                 Vitals at start of session: 135/71 pulse 64    Treat Ambulate ~ 1400 ft with SPC and single seated rest break; cueing for upright posture, step length. Hip extension limited bilaterally with resultant increase in low back pain with prolonged ambulation. Close CGA ; Second trial of ambulation: > 1000 ft with CGA ; one rest break Cues for upright posture with one episode of near LOB due to fatigue.    Seated hamstring stretch 2x 60 seconds each LE , leg on PT shoulder   10x STS from standard height chair  Seated BTB abduction 15x each LE  Seated BTB adduction against PT resistance 15x each LE   swiss ball forward rollout 10x 10 second holds Swiss ball lateral/diagonal rollout 10x 10 second holds    Pt educated throughout session about proper posture and technique with exercises. Improved exercise technique, movement at target joints, use of target muscles after min to mod verbal, visual, tactile cues.    Access Code: [email or web address] URL: [email or web address] Date: 04/29/2019 Prepared by: Janna Arch  Exercises Modified Marcello Moores Stretch - 1 x daily - 7 x weekly - 2 sets - 2 reps - 30 hold Standing Hip Flexor Stretch - 1 x daily - 7 x weekly - 2 sets - 2 reps - 30 hold  Patient continues to progress with  ambulatory capacity, requiring only two seated rest breaks during prolonged mobility interventions.  Patient is limited by back pain and fatigue however requiring both seated rest breaks. Patient will benefit from skilled physical therapy to increase stability, strength, and mobility for improved quality of life and independence with ADL and iADLs         PT Education - 04/29/19 1330    Education Details  exercise technique, body mechanics    Person(s) Educated  Patient    Methods  Explanation;Demonstration;Tactile cues;Verbal cues    Comprehension  Verbalized understanding;Returned demonstration;Verbal cues required;Tactile cues required       PT Short Term Goals - 04/24/19 1332      PT SHORT TERM GOAL #1   Title  Patient will be independent in home exercise program to improve strength/mobility for better functional independence with ADLs.    Baseline  3/3 HEP next session 4/12 HEP compliant    Time  2    Period  Weeks    Status  Partially Met    Target Date  05/06/19      PT SHORT TERM GOAL #2   Title  Patient will ambulate for 6 minutes without seated rest break to increase tolerance for 6 minute walk test    Baseline  3/3: only able to ambulate 3 minutes and 59 seconds prior to rest break 4/12 6 min walk test deferred till next session 4/14: 6 minutes with one rest break    Time  2    Period  Weeks    Status  Partially Met    Target Date  03/27/19        PT Long Term Goals - 04/24/19 1331      PT LONG TERM GOAL #1   Title  Patient will increase six minute walk test distance to >1000 with LRAD for progression to community ambulator and improve gait ability    Baseline  3/3: 700 ft, terminated early 4/12: deferred due to walking down to PT gym 4/14: 740 ft 6 minutes with one seated rest break    Time  8    Period  Weeks    Status  Partially Met    Target Date  05/08/19      PT LONG TERM GOAL #2   Title  Patient will increase FOTO score to equal to or greater than  57/100 to demonstrate statistically significant improved mobility and quality of life.    Baseline  3/3: 47/100 risk adjusted 43/100 4/12: 41/100    Time  8    Period  Weeks    Status  On-going    Target Date  05/08/19      PT LONG TERM GOAL #3   Title  Patient will increase Berg Balance score by >  6 points (47/100) to demonstrate decreased fall risk during functional activities.    Baseline  3/3: 41/56 4/12; 49/56    Time  8    Period  Weeks    Status  Achieved      PT LONG TERM GOAL #4   Title  Patient will increase BLE gross strength to 4+/5 as to improve functional strength for independent gait, increased standing tolerance and increased ADL ability.    Baseline  3/3: LLE grossly 4-/5 R grossly 3+ hip 4- knee 4/12: LLE grossly 4- to 4/5 with R 4- hip    Time  8    Period  Weeks    Status  Partially Met    Target Date  05/08/19            Plan - 04/29/19 1401    Clinical Impression Statement  Patient continues to progress with ambulatory capacity, requiring only two seated rest breaks during prolonged mobility interventions.  Patient is limited by back pain and fatigue however requiring both seated rest breaks. Patient will benefit from skilled physical therapy to increase stability, strength, and mobility for improved quality of life and independence with ADL and iADLs    Personal Factors and Comorbidities  Age;Comorbidity 3+;Finances;Fitness;Past/Current Experience;Social Background;Time since onset of injury/illness/exacerbation;Transportation    Comorbidities  DM, heart murmur, HLD, HTN, sleep apnea, cellulitis of L foot, hypomagnesemia, unilateral complete BKA R (2019), stage III CKD, PAD, legally blind, hypokalemia, AKI, and sepsis    Examination-Activity Limitations  Bathing;Bed Mobility;Caring for [employer];Locomotion Level;Lift;Stand;Toileting;Transfers    Examination-Participation Restrictions  Church;Cleaning;Community  Activity;Driving;Interpersonal Relationship;Laundry;Shop;Meal Prep;Yard Work    [employer]chant [employer] officer  Evolving/Moderate complexity    Rehab Potential  Fair    PT Frequency  2x / week    PT Duration  8 weeks    PT Treatment/Interventions  ADLs/Self Care Home Management;Cryotherapy;Electrical Stimulation;Ultrasound;Traction;Moist Heat;DME Instruction;Gait training;Stair training;Functional mobility training;Therapeutic activities;Therapeutic exercise;Patient/family education;Neuromuscular re-education;Balance training;Prosthetic Training;Wheelchair mobility training;Manual techniques;Manual lymph drainage;Compression bandaging;Taping;Energy conservation;Passive range of motion;Aquatic Therapy;Vestibular    PT Next Visit Plan  give HEP, prone    Consulted and Agree with Plan of Care  Patient       Patient will benefit from skilled therapeutic intervention in order to improve the following deficits and impairments:  Abnormal gait, Decreased activity tolerance, Decreased balance, Decreased knowledge of precautions, Decreased endurance, Decreased knowledge of use of DME, Decreased mobility, Decreased range of motion, Difficulty walking, Decreased strength, Increased edema, Impaired flexibility, Impaired perceived functional ability, Prosthetic Dependency, Postural dysfunction, Improper body mechanics, Pain, Decreased skin integrity, Impaired vision/preception, Impaired sensation  Visit Diagnosis: Other abnormalities of gait and mobility  Muscle weakness (generalized)  Unsteadiness on feet  Unilateral complete BKA, right, sequela ([place])     Problem List Patient Active Problem List   Diagnosis Date Noted  . Elevated lactic acid level   . Diabetic ketoacidosis without coma associated with type 2 diabetes mellitus ([place])   . Cellulitis of left foot 12/17/2018  . Pain due to onychomycosis of toenail of right foot 07/05/2018  . Abnormality of gait 10/12/2017  . Type 2 diabetes  mellitus with hyperglycemia, with long-term current use of insulin ([place])   . Flatulence   . Hypomagnesemia   . Unilateral complete BKA, right, sequela ([place])   . Benign essential HTN   . Hypoalbuminemia due to protein-calorie malnutrition ([place])   . S/P below knee amputation, right ([place]) 04/12/2017  . Acute blood loss anemia   . Other encephalopathy 04/08/2017  . Encephalopathy 04/08/2017  .  Altered mental status 04/08/2017  . Labile blood pressure   . Labile blood glucose   . Drug induced constipation   . Stage 3 chronic kidney disease   . Bacteremia   . S/P unilateral BKA (below knee amputation), right ([place]) 04/04/2017  . PAD (peripheral artery disease) ([place])   . Type 2 diabetes mellitus with right diabetic foot ulcer ([place])   . Post-operative pain   . Legally blind   . Upper GI bleed   . Streptococcal bacteremia 04/01/2017  . Hypokalemia 03/30/2017  . Uncontrolled type 2 diabetes mellitus with hyperglycemia, with long-term current use of insulin ([place]) 03/30/2017  . Type 2 diabetes mellitus with peripheral neuropathy ([place]) 03/30/2017  . AKI (acute kidney injury) ([place]) 03/30/2017  . CKD stage 3 due to type 2 diabetes mellitus ([place]) 03/30/2017  . Sepsis ([place]) 03/30/2017  . Heart murmur 11/22/2016  . Hyperlipidemia associated with type 2 diabetes mellitus ([place]) 11/22/2016  . Obstructive sleep apnea syndrome 11/22/2016  . Essential hypertension 12/17/2012   Janna Arch, PT, DPT   04/29/2019, 2:05 PM  [place] MAIN [facility] SERVICES [address] [place], [place], [address] Phone: [phone]   Fax:  [phone]  Name: BRAM HOTTEL MRN: [id number] Date of Birth: 10-08-1955

## 2019-05-01 ENCOUNTER — Ambulatory Visit: Payer: Medicare Other

## 2019-05-01 ENCOUNTER — Other Ambulatory Visit: Payer: Self-pay

## 2019-05-01 DIAGNOSIS — R2681 Unsteadiness on feet: Secondary | ICD-10-CM

## 2019-05-01 DIAGNOSIS — R2689 Other abnormalities of gait and mobility: Secondary | ICD-10-CM

## 2019-05-01 DIAGNOSIS — S88111S Complete traumatic amputation at level between knee and ankle, right lower leg, sequela: Secondary | ICD-10-CM

## 2019-05-01 DIAGNOSIS — M6281 Muscle weakness (generalized): Secondary | ICD-10-CM

## 2019-05-01 NOTE — Therapy (Signed)
[place] MAIN [facility] SERVICES [address] [place], [place], [address] Phone: [phone]   Fax:  [phone]  Physical Therapy Treatment  Patient Details  Name: Trevor [name] MRN: [id number] Date of Birth: April 16, 1955 Referring Provider (PT): Jamse Arn, MD   Encounter Date: 05/01/2019  PT End of Session - 05/01/19 1314    Visit Number  13    Number of Visits  16    Date for PT Re-Evaluation  05/08/19    Authorization Type  3/10 PN 4/12    PT Start Time  1303    PT Stop Time  1344    PT Time Calculation (min)  41 min    Equipment Utilized During Treatment  Gait belt;Other (comment)   R prosthesis   Activity Tolerance  Patient limited by fatigue;Patient tolerated treatment well    Behavior During Therapy  [facility] for tasks assessed/performed       Past Medical History:  Diagnosis Date  . Diabetes mellitus without complication ([place])   . Heart murmur   . Hyperlipidemia   . Hypertension   . Sleep apnea     Past Surgical History:  Procedure Laterality Date  . AMPUTATION Right 03/31/2017   Procedure: RIGHT FOOT 1ST AND 2ND RAY AMPUTATION;  Surgeon: Newt Minion, MD;  Location: [place];  Service: Orthopedics;  Laterality: Right;  . AMPUTATION Right 04/01/2017   Procedure: AMPUTATION BELOW KNEE;  Surgeon: Newt Minion, MD;  Location: [place];  Service: Orthopedics;  Laterality: Right;  . COLONOSCOPY WITH PROPOFOL N/A 10/02/2017   Procedure: COLONOSCOPY WITH PROPOFOL;  Surgeon: [name], MD;  Location[facility] ENDOSCOPY;  Service: Gastroenterology;  Laterality: N/A;  . CORNEAL TRANSPLANT      There were no vitals filed for this visit.  Subjective Assessment - 05/01/19 1309    Subjective  Patient reports no falls or LOB since last session. Reports his foot is healing well.    Pertinent History  Patient returning to PT after multiple hospitalizations for L foot and COVID-19. Received home health therapy till 02/14/19  . Patient last  seen 12/13/2018 in this outpatient clinic. PMH includes DM, heart murmur, HLD, HTN, sleep apnea, cellulitis of L foot, hypomagnesemia, unilateral complete BKA R (2019), stage III CKD, PAD, legally blind, hypokalemia, AKI, and sepsis. Uses wheelchair primarily at home rather than walking with prosthesis.    Limitations  Standing;Walking;House hold activities;Other (comment);Lifting    How long can you sit comfortably?  hours /na/    How long can you stand comfortably?  15 minutes with UE support    How long can you walk comfortably?  walked in the house    Patient Stated Goals  walk better.    Currently in Pain?  No/denies           Vitals at start of session 137/76 pulse 65    Treat   Ambulate 96 ft with SPC and CGA, implementation of obstacles for negotiation and changing of directions/turning for carryover to patient's natural environment. Cueing for widening BOS, excessive external rotation of LLE noted. Improved ability to negotiate obstacles with repeated lap performance with increased weight shift onto prosthetic limb resulting in improved step length bilaterally.  seated between laps; heat pad to back  Exercises between each lap for super set.   After Lap 1: Hamstring stretch 60 seconds each LE seated Good morning Y seated stretch 10x.   After lap 2:  Stair stretch hamstring 30 seconds, hip  flexor 30 seconds each LE, 2 sets  step over theraband and back: ER/IR with hip flexion 15x each LE    After lap 3:  BTB around knees hip abduction 15x BTB around knees marching 15x each LE ; cues for keeping feet and knee in alignment (LLE)   After lap 4; (  No AD with lap) BTB hip adduction 15x each LE against PT resistance BTB resisted hamstring curl 15x each LE against PT resistance.    After lap 5: (no AD with lap) BTB around ankles: LAQ 15x each LE ; cues for decreasing velocity for improved muscle activation and control of eccentric return BTB around ankles: ER/IR 15x each  LE   Vitals monitored throughout session occasional rest breaks required for fatigue, HR elevation, and Sp02 depression.      Pt educated throughout session about proper posture and technique with exercises. Improved exercise technique, movement at target joints, use of target muscles after min to mod verbal, visual, tactile cues.                   PT Education - 05/01/19 1309    Education Details  exercise technique, body mechanics    Person(s) Educated  Patient    Methods  Explanation;Demonstration;Tactile cues;Verbal cues    Comprehension  Verbalized understanding;Returned demonstration;Verbal cues required;Tactile cues required       PT Short Term Goals - 04/24/19 1332      PT SHORT TERM GOAL #1   Title  Patient will be independent in home exercise program to improve strength/mobility for better functional independence with ADLs.    Baseline  3/3 HEP next session 4/12 HEP compliant    Time  2    Period  Weeks    Status  Partially Met    Target Date  05/06/19      PT SHORT TERM GOAL #2   Title  Patient will ambulate for 6 minutes without seated rest break to increase tolerance for 6 minute walk test    Baseline  3/3: only able to ambulate 3 minutes and 59 seconds prior to rest break 4/12 6 min walk test deferred till next session 4/14: 6 minutes with one rest break    Time  2    Period  Weeks    Status  Partially Met    Target Date  03/27/19        PT Long Term Goals - 04/24/19 1331      PT LONG TERM GOAL #1   Title  Patient will increase six minute walk test distance to >1000 with LRAD for progression to community ambulator and improve gait ability    Baseline  3/3: 700 ft, terminated early 4/12: deferred due to walking down to PT gym 4/14: 740 ft 6 minutes with one seated rest break    Time  8    Period  Weeks    Status  Partially Met    Target Date  05/08/19      PT LONG TERM GOAL #2   Title  Patient will increase FOTO score to equal to or  greater than 57/100 to demonstrate statistically significant improved mobility and quality of life.    Baseline  3/3: 47/100 risk adjusted 43/100 4/12: 41/100    Time  8    Period  Weeks    Status  On-going    Target Date  05/08/19      PT LONG TERM GOAL #3   Title  Patient will  increase Berg Balance score by > 6 points (47/100) to demonstrate decreased fall risk during functional activities.    Baseline  3/3: 41/56 4/12; 49/56    Time  8    Period  Weeks    Status  Achieved      PT LONG TERM GOAL #4   Title  Patient will increase BLE gross strength to 4+/5 as to improve functional strength for independent gait, increased standing tolerance and increased ADL ability.    Baseline  3/3: LLE grossly 4-/5 R grossly 3+ hip 4- knee 4/12: LLE grossly 4- to 4/5 with R 4- hip    Time  8    Period  Weeks    Status  Partially Met    Target Date  05/08/19            Plan - 05/01/19 1655    Clinical Impression Statement  Patient's session today focused on continuous superset of ambulation with seated strengthening for increasing capacity for prolonged mobility as well as safety with ambulation when fatigue. He was able to perform two laps without his AD with improved gait mechanics and increasing weight acceptance onto prosthetic limb. Patient will benefit from skilled physical therapy to increase stability, strength, and mobility for improved quality of life and independence with ADL and iADLs    Personal Factors and Comorbidities  Age;Comorbidity 3+;Finances;Fitness;Past/Current Experience;Social Background;Time since onset of injury/illness/exacerbation;Transportation    Comorbidities  DM, heart murmur, HLD, HTN, sleep apnea, cellulitis of L foot, hypomagnesemia, unilateral complete BKA R (2019), stage III CKD, PAD, legally blind, hypokalemia, AKI, and sepsis    Examination-Activity Limitations  Bathing;Bed Mobility;Caring for [employer];Locomotion  Level;Lift;Stand;Toileting;Transfers    Examination-Participation Restrictions  Church;Cleaning;Community Activity;Driving;Interpersonal Relationship;Laundry;Shop;Meal Prep;Yard Work    [employer]chant [employer] officer  Evolving/Moderate complexity    Rehab Potential  Fair    PT Frequency  2x / week    PT Duration  8 weeks    PT Treatment/Interventions  ADLs/Self Care Home Management;Cryotherapy;Electrical Stimulation;Ultrasound;Traction;Moist Heat;DME Instruction;Gait training;Stair training;Functional mobility training;Therapeutic activities;Therapeutic exercise;Patient/family education;Neuromuscular re-education;Balance training;Prosthetic Training;Wheelchair mobility training;Manual techniques;Manual lymph drainage;Compression bandaging;Taping;Energy conservation;Passive range of motion;Aquatic Therapy;Vestibular    PT Next Visit Plan  give HEP, prone    Consulted and Agree with Plan of Care  Patient       Patient will benefit from skilled therapeutic intervention in order to improve the following deficits and impairments:  Abnormal gait, Decreased activity tolerance, Decreased balance, Decreased knowledge of precautions, Decreased endurance, Decreased knowledge of use of DME, Decreased mobility, Decreased range of motion, Difficulty walking, Decreased strength, Increased edema, Impaired flexibility, Impaired perceived functional ability, Prosthetic Dependency, Postural dysfunction, Improper body mechanics, Pain, Decreased skin integrity, Impaired vision/preception, Impaired sensation  Visit Diagnosis: Other abnormalities of gait and mobility  Muscle weakness (generalized)  Unsteadiness on feet  Unilateral complete BKA, right, sequela ([place])     Problem List Patient Active Problem List   Diagnosis Date Noted  . Elevated lactic acid level   . Diabetic ketoacidosis without coma associated with type 2 diabetes mellitus ([place])   . Cellulitis of left foot 12/17/2018  . Pain due to  onychomycosis of toenail of right foot 07/05/2018  . Abnormality of gait 10/12/2017  . Type 2 diabetes mellitus with hyperglycemia, with long-term current use of insulin ([place])   . Flatulence   . Hypomagnesemia   . Unilateral complete BKA, right, sequela ([place])   . Benign essential HTN   . Hypoalbuminemia due to protein-calorie malnutrition ([place])   . S/P below  knee amputation, right ([place]) 04/12/2017  . Acute blood loss anemia   . Other encephalopathy 04/08/2017  . Encephalopathy 04/08/2017  . Altered mental status 04/08/2017  . Labile blood pressure   . Labile blood glucose   . Drug induced constipation   . Stage 3 chronic kidney disease   . Bacteremia   . S/P unilateral BKA (below knee amputation), right ([place]) 04/04/2017  . PAD (peripheral artery disease) ([place])   . Type 2 diabetes mellitus with right diabetic foot ulcer ([place])   . Post-operative pain   . Legally blind   . Upper GI bleed   . Streptococcal bacteremia 04/01/2017  . Hypokalemia 03/30/2017  . Uncontrolled type 2 diabetes mellitus with hyperglycemia, with long-term current use of insulin ([place]) 03/30/2017  . Type 2 diabetes mellitus with peripheral neuropathy ([place]) 03/30/2017  . AKI (acute kidney injury) ([place]) 03/30/2017  . CKD stage 3 due to type 2 diabetes mellitus ([place]) 03/30/2017  . Sepsis ([place]) 03/30/2017  . Heart murmur 11/22/2016  . Hyperlipidemia associated with type 2 diabetes mellitus ([place]) 11/22/2016  . Obstructive sleep apnea syndrome 11/22/2016  . Essential hypertension 12/17/2012   Janna Arch, PT, DPT   05/01/2019, 4:56 PM  [place] MAIN [facility] SERVICES [address] [place], [place], [address] Phone: [phone]   Fax:  [phone]  Name: Trevor [name] MRN: [id number] Date of Birth: [birth year]

## 2019-05-06 ENCOUNTER — Ambulatory Visit: Payer: Medicare Other

## 2019-05-06 ENCOUNTER — Other Ambulatory Visit: Payer: Self-pay

## 2019-05-06 ENCOUNTER — Encounter: Payer: Medicare Other | Admitting: Physical Medicine & Rehabilitation

## 2019-05-06 DIAGNOSIS — M6281 Muscle weakness (generalized): Secondary | ICD-10-CM

## 2019-05-06 DIAGNOSIS — S88111S Complete traumatic amputation at level between knee and ankle, right lower leg, sequela: Secondary | ICD-10-CM

## 2019-05-06 DIAGNOSIS — R2689 Other abnormalities of gait and mobility: Secondary | ICD-10-CM

## 2019-05-06 DIAGNOSIS — R2681 Unsteadiness on feet: Secondary | ICD-10-CM

## 2019-05-06 NOTE — Therapy (Signed)
[place] MAIN [facility] SERVICES [address] [place], [place], [address] Phone: [phone]   Fax:  [phone]  Physical Therapy Treatment  Patient Details  Name: Trevor [name] MRN: [id number] Date of Birth: 04-Dec-1955 Referring Provider (PT): Jamse Arn, MD   Encounter Date: 05/06/2019  PT End of Session - 05/06/19 1320    Visit Number  14    Number of Visits  16    Date for PT Re-Evaluation  05/08/19    Authorization Type  4/10 PN 4/12    PT Start Time  1303    PT Stop Time  1345    PT Time Calculation (min)  42 min    Equipment Utilized During Treatment  Gait belt;Other (comment)   R prosthesis   Activity Tolerance  Patient limited by fatigue;Patient tolerated treatment well    Behavior During Therapy  [facility] for tasks assessed/performed       Past Medical History:  Diagnosis Date  . Diabetes mellitus without complication ([place])   . Heart murmur   . Hyperlipidemia   . Hypertension   . Sleep apnea     Past Surgical History:  Procedure Laterality Date  . AMPUTATION Right 03/31/2017   Procedure: RIGHT FOOT 1ST AND 2ND RAY AMPUTATION;  Surgeon: Newt Minion, MD;  Location: [place];  Service: Orthopedics;  Laterality: Right;  . AMPUTATION Right 04/01/2017   Procedure: AMPUTATION BELOW KNEE;  Surgeon: Newt Minion, MD;  Location: [place];  Service: Orthopedics;  Laterality: Right;  . COLONOSCOPY WITH PROPOFOL N/A 10/02/2017   Procedure: COLONOSCOPY WITH PROPOFOL;  Surgeon: [name], MD;  Location: [facility] ENDOSCOPY;  Service: Gastroenterology;  Laterality: N/A;  . CORNEAL TRANSPLANT      There were no vitals filed for this visit.  Subjective Assessment - 05/06/19 1317    Subjective  Patient reports no falls or LOB since last session. Patient reports no pain at start of session.    Pertinent History  Patient returning to PT after multiple hospitalizations for L foot and COVID-19. Received home health therapy till 02/14/19  .  Patient last seen 12/13/2018 in this outpatient clinic. PMH includes DM, heart murmur, HLD, HTN, sleep apnea, cellulitis of L foot, hypomagnesemia, unilateral complete BKA R (2019), stage III CKD, PAD, legally blind, hypokalemia, AKI, and sepsis. Uses wheelchair primarily at home rather than walking with prosthesis.    Limitations  Standing;Walking;House hold activities;Other (comment);Lifting    How long can you sit comfortably?  hours /na/    How long can you stand comfortably?  15 minutes with UE support    How long can you walk comfortably?  walked in the house    Patient Stated Goals  walk better.    Currently in Pain?  No/denies              Vitals at start of session:  144/77 pulse 66     Treat Ambulate ~ 10 00 ft with SPCcueing for upright posture, step length. Hip extension limited bilaterally with resultant increase in low back pain with prolonged ambulation. Close CGA ; 900 ft without AD   Stair hamstring stretch 40 seconds each LE; BUE support   Stair hip flexor stretch 40 seconds each LE; BUE support   airex pad: one foot on 6" step one foot on airex pad: static hold 60 seconds x 2 trials each LE  16x sit to stand reaching for ball and then throwing/aiming into hoop, return to sitting  position. Occasional use of UE's for STS transition required however due to fatigue.    Seated BTB abduction 15x each LE  Seated BTB adduction against PT resistance 15x each LE    swiss ball forward rollout 10x 10 second holds     Pt educated throughout session about proper posture and technique with exercises. Improved exercise technique, movement at target joints, use of target muscles after min to mod verbal, visual, tactile cues.             PT Education - 05/06/19 1319    Education Details  exercise technique, body mechanics    Person(s) Educated  Patient    Methods  Explanation;Demonstration;Tactile cues;Verbal cues    Comprehension  Verbalized  understanding;Returned demonstration;Verbal cues required;Tactile cues required       PT Short Term Goals - 04/24/19 1332      PT SHORT TERM GOAL #1   Title  Patient will be independent in home exercise program to improve strength/mobility for better functional independence with ADLs.    Baseline  3/3 HEP next session 4/12 HEP compliant    Time  2    Period  Weeks    Status  Partially Met    Target Date  05/06/19      PT SHORT TERM GOAL #2   Title  Patient will ambulate for 6 minutes without seated rest break to increase tolerance for 6 minute walk test    Baseline  3/3: only able to ambulate 3 minutes and 59 seconds prior to rest break 4/12 6 min walk test deferred till next session 4/14: 6 minutes with one rest break    Time  2    Period  Weeks    Status  Partially Met    Target Date  03/27/19        PT Long Term Goals - 04/24/19 1331      PT LONG TERM GOAL #1   Title  Patient will increase six minute walk test distance to >1000 with LRAD for progression to community ambulator and improve gait ability    Baseline  3/3: 700 ft, terminated early 4/12: deferred due to walking down to PT gym 4/14: 740 ft 6 minutes with one seated rest break    Time  8    Period  Weeks    Status  Partially Met    Target Date  05/08/19      PT LONG TERM GOAL #2   Title  Patient will increase FOTO score to equal to or greater than 57/100 to demonstrate statistically significant improved mobility and quality of life.    Baseline  3/3: 47/100 risk adjusted 43/100 4/12: 41/100    Time  8    Period  Weeks    Status  On-going    Target Date  05/08/19      PT LONG TERM GOAL #3   Title  Patient will increase Berg Balance score by > 6 points (47/100) to demonstrate decreased fall risk during functional activities.    Baseline  3/3: 41/56 4/12; 49/56    Time  8    Period  Weeks    Status  Achieved      PT LONG TERM GOAL #4   Title  Patient will increase BLE gross strength to 4+/5 as to improve  functional strength for independent gait, increased standing tolerance and increased ADL ability.    Baseline  3/3: LLE grossly 4-/5 R grossly 3+ hip 4- knee 4/12: LLE grossly  4- to 4/5 with R 4- hip    Time  8    Period  Weeks    Status  Partially Met    Target Date  05/08/19            Plan - 05/06/19 1501    Clinical Impression Statement  Patient presents with good motivation throughout session. Progression of sit to stand transfers performed with patient fatiguing with repetition requiring increased assistance from UE's. Stair stretch is utilized for reduction of back pain after prolonged ambulation without AD. Next session will be recert. Patient will benefit from skilled physical therapy to increase stability, strength, and mobility for improved quality of life and independence with ADL and iADLs    Personal Factors and Comorbidities  Age;Comorbidity 3+;Finances;Fitness;Past/Current Experience;Social Background;Time since onset of injury/illness/exacerbation;Transportation    Comorbidities  DM, heart murmur, HLD, HTN, sleep apnea, cellulitis of L foot, hypomagnesemia, unilateral complete BKA R (2019), stage III CKD, PAD, legally blind, hypokalemia, AKI, and sepsis    Examination-Activity Limitations  Bathing;Bed Mobility;Caring for [employer];Locomotion Level;Lift;Stand;Toileting;Transfers    Examination-Participation Restrictions  Church;Cleaning;Community Activity;Driving;Interpersonal Relationship;Laundry;Shop;Meal Prep;Yard Work    [employer]chant [employer] officer  Evolving/Moderate complexity    Rehab Potential  Fair    PT Frequency  2x / week    PT Duration  8 weeks    PT Treatment/Interventions  ADLs/Self Care Home Management;Cryotherapy;Electrical Stimulation;Ultrasound;Traction;Moist Heat;DME Instruction;Gait training;Stair training;Functional mobility training;Therapeutic activities;Therapeutic exercise;Patient/family  education;Neuromuscular re-education;Balance training;Prosthetic Training;Wheelchair mobility training;Manual techniques;Manual lymph drainage;Compression bandaging;Taping;Energy conservation;Passive range of motion;Aquatic Therapy;Vestibular    PT Next Visit Plan  give HEP, prone    Consulted and Agree with Plan of Care  Patient       Patient will benefit from skilled therapeutic intervention in order to improve the following deficits and impairments:  Abnormal gait, Decreased activity tolerance, Decreased balance, Decreased knowledge of precautions, Decreased endurance, Decreased knowledge of use of DME, Decreased mobility, Decreased range of motion, Difficulty walking, Decreased strength, Increased edema, Impaired flexibility, Impaired perceived functional ability, Prosthetic Dependency, Postural dysfunction, Improper body mechanics, Pain, Decreased skin integrity, Impaired vision/preception, Impaired sensation  Visit Diagnosis: Other abnormalities of gait and mobility  Muscle weakness (generalized)  Unsteadiness on feet  Unilateral complete BKA, right, sequela ([place])     Problem List Patient Active Problem List   Diagnosis Date Noted  . Elevated lactic acid level   . Diabetic ketoacidosis without coma associated with type 2 diabetes mellitus ([place])   . Cellulitis of left foot 12/17/2018  . Pain due to onychomycosis of toenail of right foot 07/05/2018  . Abnormality of gait 10/12/2017  . Type 2 diabetes mellitus with hyperglycemia, with long-term current use of insulin ([place])   . Flatulence   . Hypomagnesemia   . Unilateral complete BKA, right, sequela ([place])   . Benign essential HTN   . Hypoalbuminemia due to protein-calorie malnutrition ([place])   . S/P below knee amputation, right ([place]) 04/12/2017  . Acute blood loss anemia   . Other encephalopathy 04/08/2017  . Encephalopathy 04/08/2017  . Altered mental status 04/08/2017  . Labile blood pressure   . Labile blood glucose   .  Drug induced constipation   . Stage 3 chronic kidney disease   . Bacteremia   . S/P unilateral BKA (below knee amputation), right ([place]) 04/04/2017  . PAD (peripheral artery disease) ([place])   . Type 2 diabetes mellitus with right diabetic foot ulcer ([place])   . Post-operative pain   . Legally blind   . Upper GI  bleed   . Streptococcal bacteremia 04/01/2017  . Hypokalemia 03/30/2017  . Uncontrolled type 2 diabetes mellitus with hyperglycemia, with long-term current use of insulin ([place]) 03/30/2017  . Type 2 diabetes mellitus with peripheral neuropathy ([place]) 03/30/2017  . AKI (acute kidney injury) ([place]) 03/30/2017  . CKD stage 3 due to type 2 diabetes mellitus ([place]) 03/30/2017  . Sepsis ([place]) 03/30/2017  . Heart murmur 11/22/2016  . Hyperlipidemia associated with type 2 diabetes mellitus ([place]) 11/22/2016  . Obstructive sleep apnea syndrome 11/22/2016  . Essential hypertension 12/17/2012   Janna Arch, PT, DPT   05/06/2019, 3:07 PM  [place] MAIN [facility] SERVICES [address] [place], [place], [address] Phone: [phone]   Fax:  [phone]  Name: Trevor [name] MRN: [id number] Date of Birth: 1955-03-14

## 2019-05-08 ENCOUNTER — Ambulatory Visit: Payer: Medicare Other

## 2019-05-08 ENCOUNTER — Other Ambulatory Visit: Payer: Self-pay

## 2019-05-08 DIAGNOSIS — R2689 Other abnormalities of gait and mobility: Secondary | ICD-10-CM

## 2019-05-08 DIAGNOSIS — R2681 Unsteadiness on feet: Secondary | ICD-10-CM

## 2019-05-08 DIAGNOSIS — S88111S Complete traumatic amputation at level between knee and ankle, right lower leg, sequela: Secondary | ICD-10-CM

## 2019-05-08 DIAGNOSIS — M6281 Muscle weakness (generalized): Secondary | ICD-10-CM

## 2019-05-08 NOTE — Therapy (Signed)
[place] MAIN [facility] SERVICES [address] [place], [place], [address] Phone: [phone]   Fax:  [phone]  Physical Therapy Treatment/RECERT  Patient Details  Name: Trevor [name] MRN: [id number] Date of Birth: [birth year] Referring Provider (PT): Jamse Arn, MD   Encounter Date: 05/08/2019  PT End of Session - 05/08/19 1322    Visit Number  15    Number of Visits  31    Date for PT Re-Evaluation  07/03/19    Authorization Type  5/10 PN 4/12    PT Start Time  6761    PT Stop Time  1345    PT Time Calculation (min)  38 min    Equipment Utilized During Treatment  Gait belt;Other (comment)   R prosthesis   Activity Tolerance  Patient limited by fatigue;Patient tolerated treatment well    Behavior During Therapy  [facility] for tasks assessed/performed       Past Medical History:  Diagnosis Date  . Diabetes mellitus without complication ([place])   . Heart murmur   . Hyperlipidemia   . Hypertension   . Sleep apnea     Past Surgical History:  Procedure Laterality Date  . AMPUTATION Right 03/31/2017   Procedure: RIGHT FOOT 1ST AND 2ND RAY AMPUTATION;  Surgeon: Newt Minion, MD;  Location: [place];  Service: Orthopedics;  Laterality: Right;  . AMPUTATION Right 04/01/2017   Procedure: AMPUTATION BELOW KNEE;  Surgeon: Newt Minion, MD;  Location: [place];  Service: Orthopedics;  Laterality: Right;  . COLONOSCOPY WITH PROPOFOL N/A 10/02/2017   Procedure: COLONOSCOPY WITH PROPOFOL;  Surgeon: [name], MD;  Location: [facility] ENDOSCOPY;  Service: Gastroenterology;  Laterality: N/A;  . CORNEAL TRANSPLANT      There were no vitals filed for this visit.  Subjective Assessment - 05/08/19 1321    Subjective  Patient reports he is running late. Wants to be able to walk in the sand by the end of the year.    Pertinent History  Patient returning to PT after multiple hospitalizations for L foot and COVID-19. Received home health therapy till 02/14/19   . Patient last seen 12/13/2018 in this outpatient clinic. PMH includes DM, heart murmur, HLD, HTN, sleep apnea, cellulitis of L foot, hypomagnesemia, unilateral complete BKA R (2019), stage III CKD, PAD, legally blind, hypokalemia, AKI, and sepsis. Uses wheelchair primarily at home rather than walking with prosthesis.    Limitations  Standing;Walking;House hold activities;Other (comment);Lifting    How long can you sit comfortably?  hours /na/    How long can you stand comfortably?  15 minutes with UE support    How long can you walk comfortably?  walked in the house    Patient Stated Goals  walk better.    Currently in Pain?  No/denies         [facility] PT Assessment - 05/08/19 0001      Standardized Balance Assessment   Standardized Balance Assessment  Dynamic Gait Index      Dynamic Gait Index   Level Surface  Mild Impairment    Change in Gait Speed  Mild Impairment    Gait with Horizontal Head Turns  Mild Impairment    Gait with Vertical Head Turns  Mild Impairment    Gait and Pivot Turn  Moderate Impairment    Step Over Obstacle  Moderate Impairment    Step Around Obstacles  Mild Impairment    Steps  Mild Impairment    Total Score  14      Vitals at start of session: 154/71 pulse 64   Goals: FOTO: 50/100 DGI: 14/24   Just did goals on 4/14     Treat Ambulate~10 00 ft with SPCcueing for upright posture, step length. Hip extension limited bilaterally with resultant increase in low back pain with prolonged ambulation. Close CGA; 900 ft without AD  Stair hamstring stretch 40 seconds each LE; BUE support 2 trials each LE  Stair hip flexor stretch 40 seconds each LE; BUE support 2 trials each LE  airex pad, slow march for focus on single limb stability 12x each LE, 3 second hold    Pt educated throughout session about proper posture and technique with exercises. Improved exercise technique, movement at target joints, use of target muscles after min to mod verbal,  visual, tactile cues.   Patient recently performed goals on 4/14, please refer to that day's note for further details. New goal of DGI created with patient scoring 14/24 this session. FOTO score has improved at this time. Prolonged standing is challenging for patient due to back pain limiting his capacity for prolonged mobility. In general patient is progressing with functional mobility and strength in steady slow compliance with program.  Patient will benefit from skilled physical therapy to increase stability, strength, and mobility for improved quality of life and independence with ADL and iADLs           PT Education - 05/08/19 1322    Education Details  goals, POC,    Person(s) Educated  Patient    Methods  Explanation    Comprehension  Verbalized understanding       PT Short Term Goals - 05/08/19 1325      PT SHORT TERM GOAL #1   Title  Patient will be independent in home exercise program to improve strength/mobility for better functional independence with ADLs.    Baseline  3/3 HEP next session 4/12 HEP compliant 4/28: compliant    Time  2    Period  Weeks    Status  Partially Met    Target Date  05/06/19      PT SHORT TERM GOAL #2   Title  Patient will ambulate for 6 minutes without seated rest break to increase tolerance for 6 minute walk test    Baseline  3/3: only able to ambulate 3 minutes and 59 seconds prior to rest break 4/12 6 min walk test deferred till next session 4/14: 6 minutes with one rest break    Time  2    Period  Weeks    Status  Partially Met    Target Date  05/22/19        PT Long Term Goals - 05/08/19 1325      PT LONG TERM GOAL #1   Title  Patient will increase six minute walk test distance to >1000 with LRAD for progression to community ambulator and improve gait ability    Baseline  3/3: 700 ft, terminated early 4/12: deferred due to walking down to PT gym 4/14: 740 ft 6 minutes with one seated rest break    Time  8    Period  Weeks     Status  Partially Met    Target Date  07/03/19      PT LONG TERM GOAL #2   Title  Patient will increase FOTO score to equal to or greater than 57/100 to demonstrate statistically significant improved mobility and quality of life.    Baseline  3/3: 47/100 risk adjusted 43/100 4/12: 41/100 4/28: 50/100    Time  8    Period  Weeks    Status  Partially Met    Target Date  07/03/19      PT LONG TERM GOAL #3   Title  Patient will increase Berg Balance score by > 6 points (47/56) to demonstrate decreased fall risk during functional activities.    Baseline  3/3: 41/56 4/12; 49/56    Time  8    Period  Weeks    Status  Achieved      PT LONG TERM GOAL #4   Title  Patient will increase BLE gross strength to 4+/5 as to improve functional strength for independent gait, increased standing tolerance and increased ADL ability.    Baseline  3/3: LLE grossly 4-/5 R grossly 3+ hip 4- knee 4/12: LLE grossly 4- to 4/5 with R 4- hip    Time  8    Period  Weeks    Status  Partially Met    Target Date  07/03/19      PT LONG TERM GOAL #5   Title  Patient will increase dynamic gait index score to >19/24 as to demonstrate reduced fall risk and improved dynamic gait balance for better safety with community/home ambulation.    Baseline  4/28: 14/24    Time  8    Period  Weeks    Status  New    Target Date  07/03/19            Plan - 05/08/19 1437    Clinical Impression Statement  Patient recently performed goals on 4/14, please refer to that day's note for further details. New goal of DGI created with patient scoring 14/24 this session. FOTO score has improved at this time. Prolonged standing is challenging for patient due to back pain limiting his capacity for prolonged mobility. In general patient is progressing with functional mobility and strength in steady slow compliance with program.  Patient will benefit from skilled physical therapy to increase stability, strength, and mobility for improved  quality of life and independence with ADL and iADLs    Personal Factors and Comorbidities  Age;Comorbidity 3+;Finances;Fitness;Past/Current Experience;Social Background;Time since onset of injury/illness/exacerbation;Transportation    Comorbidities  DM, heart murmur, HLD, HTN, sleep apnea, cellulitis of L foot, hypomagnesemia, unilateral complete BKA R (2019), stage III CKD, PAD, legally blind, hypokalemia, AKI, and sepsis    Examination-Activity Limitations  Bathing;Bed Mobility;Caring for [employer];Locomotion Level;Lift;Stand;Toileting;Transfers    Examination-Participation Restrictions  Church;Cleaning;Community Activity;Driving;Interpersonal Relationship;Laundry;Shop;Meal Prep;Yard Work    Merchant [employer] officer  Evolving/Moderate complexity    Rehab Potential  Fair    PT Frequency  2x / week    PT Duration  8 weeks    PT Treatment/Interventions  ADLs/Self Care Home Management;Cryotherapy;Electrical Stimulation;Ultrasound;Traction;Moist Heat;DME Instruction;Gait training;Stair training;Functional mobility training;Therapeutic activities;Therapeutic exercise;Patient/family education;Neuromuscular re-education;Balance training;Prosthetic Training;Wheelchair mobility training;Manual techniques;Manual lymph drainage;Compression bandaging;Taping;Energy conservation;Passive range of motion;Aquatic Therapy;Vestibular    PT Next Visit Plan  give HEP, prone    Consulted and Agree with Plan of Care  Patient       Patient will benefit from skilled therapeutic intervention in order to improve the following deficits and impairments:  Abnormal gait, Decreased activity tolerance, Decreased balance, Decreased knowledge of precautions, Decreased endurance, Decreased knowledge of use of DME, Decreased mobility, Decreased range of motion, Difficulty walking, Decreased strength, Increased edema, Impaired flexibility, Impaired perceived functional ability,  Prosthetic Dependency, Postural dysfunction, Improper body mechanics, Pain, Decreased skin  integrity, Impaired vision/preception, Impaired sensation  Visit Diagnosis: Other abnormalities of gait and mobility  Muscle weakness (generalized)  Unsteadiness on feet  Unilateral complete BKA, right, sequela [facility])     Problem List Patient Active Problem List   Diagnosis Date Noted  . Elevated lactic acid level   . Diabetic ketoacidosis without coma associated with type 2 diabetes mellitus ([place])   . Cellulitis of left foot 12/17/2018  . Pain due to onychomycosis of toenail of right foot 07/05/2018  . Abnormality of gait 10/12/2017  . Type 2 diabetes mellitus with hyperglycemia, with long-term current use of insulin ([place])   . Flatulence   . Hypomagnesemia   . Unilateral complete BKA, right, sequela ([place])   . Benign essential HTN   . Hypoalbuminemia due to protein-calorie malnutrition ([place])   . S/P below knee amputation, right ([place]) 04/12/2017  . Acute blood loss anemia   . Other encephalopathy 04/08/2017  . Encephalopathy 04/08/2017  . Altered mental status 04/08/2017  . Labile blood pressure   . Labile blood glucose   . Drug induced constipation   . Stage 3 chronic kidney disease   . Bacteremia   . S/P unilateral BKA (below knee amputation), right ([place]) 04/04/2017  . PAD (peripheral artery disease) ([place])   . Type 2 diabetes mellitus with right diabetic foot ulcer ([place])   . Post-operative pain   . Legally blind   . Upper GI bleed   . Streptococcal bacteremia 04/01/2017  . Hypokalemia 03/30/2017  . Uncontrolled type 2 diabetes mellitus with hyperglycemia, with long-term current use of insulin ([place]) 03/30/2017  . Type 2 diabetes mellitus with peripheral neuropathy ([place]) 03/30/2017  . AKI (acute kidney injury) ([place]) 03/30/2017  . CKD stage 3 due to type 2 diabetes mellitus ([place]) 03/30/2017  . Sepsis ([place]) 03/30/2017  . Heart murmur 11/22/2016  . Hyperlipidemia associated with  type 2 diabetes mellitus ([place]) 11/22/2016  . Obstructive sleep apnea syndrome 11/22/2016  . Essential hypertension 12/17/2012   Janna Arch, PT, DPT   05/08/2019, 2:38 PM  [place] MAIN [facility] SERVICES [address] [place], [place], [address] Phone: [phone]   Fax:  [phone]  Name: Trevor [name] MRN: [id number] Date of Birth: October 09, 1955

## 2019-05-13 ENCOUNTER — Ambulatory Visit: Payer: Medicare Other | Attending: Physical Medicine & Rehabilitation

## 2019-05-13 ENCOUNTER — Other Ambulatory Visit: Payer: Self-pay

## 2019-05-13 DIAGNOSIS — M6281 Muscle weakness (generalized): Secondary | ICD-10-CM | POA: Diagnosis present

## 2019-05-13 DIAGNOSIS — S88111S Complete traumatic amputation at level between knee and ankle, right lower leg, sequela: Secondary | ICD-10-CM | POA: Diagnosis present

## 2019-05-13 DIAGNOSIS — R2689 Other abnormalities of gait and mobility: Secondary | ICD-10-CM | POA: Diagnosis present

## 2019-05-13 DIAGNOSIS — R2681 Unsteadiness on feet: Secondary | ICD-10-CM | POA: Diagnosis present

## 2019-05-13 NOTE — Therapy (Signed)
[place] MAIN [facility] SERVICES [address] [place], [place], [address] Phone: [phone]   Fax:  [phone]  Physical Therapy Treatment  Patient Details  Name: Trevor [name] MRN: [id number] Date of Birth: [birth year] Referring Provider (PT): Jamse Arn, MD   Encounter Date: 05/13/2019  PT End of Session - 05/13/19 1708    Visit Number  16    Number of Visits  31    Date for PT Re-Evaluation  07/03/19    Authorization Type  6/10 PN 4/12    PT Start Time  1302    PT Stop Time  1340    PT Time Calculation (min)  38 min    Equipment Utilized During Treatment  Gait belt;Other (comment)   R prosthesis   Activity Tolerance  Patient limited by fatigue;Patient tolerated treatment well    Behavior During Therapy  [facility] for tasks assessed/performed       Past Medical History:  Diagnosis Date  . Diabetes mellitus without complication ([place])   . Heart murmur   . Hyperlipidemia   . Hypertension   . Sleep apnea     Past Surgical History:  Procedure Laterality Date  . AMPUTATION Right 03/31/2017   Procedure: RIGHT FOOT 1ST AND 2ND RAY AMPUTATION;  Surgeon: Newt Minion, MD;  Location: [place];  Service: Orthopedics;  Laterality: Right;  . AMPUTATION Right 04/01/2017   Procedure: AMPUTATION BELOW KNEE;  Surgeon: Newt Minion, MD;  Location: [place];  Service: Orthopedics;  Laterality: Right;  . COLONOSCOPY WITH PROPOFOL N/A 10/02/2017   Procedure: COLONOSCOPY WITH PROPOFOL;  Surgeon: [name], MD;  Location: [facility] ENDOSCOPY;  Service: Gastroenterology;  Laterality: N/A;  . CORNEAL TRANSPLANT      There were no vitals filed for this visit.  Subjective Assessment - 05/13/19 1309    Subjective  Patient reports no falls or LOB since last session. Is aching due to weather but reports no pain.    Pertinent History  Patient returning to PT after multiple hospitalizations for L foot and COVID-19. Received home health therapy till 02/14/19  .  Patient last seen 12/13/2018 in this outpatient clinic. PMH includes DM, heart murmur, HLD, HTN, sleep apnea, cellulitis of L foot, hypomagnesemia, unilateral complete BKA R (2019), stage III CKD, PAD, legally blind, hypokalemia, AKI, and sepsis. Uses wheelchair primarily at home rather than walking with prosthesis.    Limitations  Standing;Walking;House hold activities;Other (comment);Lifting    How long can you sit comfortably?  hours /na/    How long can you stand comfortably?  15 minutes with UE support    How long can you walk comfortably?  walked in the house    Patient Stated Goals  walk better.    Currently in Pain?  No/denies              Vitals at start of session:  135/76 pulse 66      Treat Ambulate ~ 10 00 ft with SPC cueing for upright posture, step length. Hip extension limited bilaterally with resultant increase in low back pain with prolonged ambulation. Close CGA ; 900 ft without AD   Standing with UE support: Close CGA and cues for body mechanics and sequencing:    -Stair hamstring stretch 40 seconds each LE; BUE support    -Stair hip flexor stretch 40 seconds each LE; BUE support    -airex pad: weight shift 20x  -airex pad; weight shift and lift 12x each LE  -  airex pad: heel toe raises 20x     seated: cues for body mechanics and sequencing for optimal muscle recruitment  swiss ball forward rollout 10x 10 second holds  Seated BTB abduction 15x each LE  Seated BTB adduction against PT resistance 15x each LE    Pt educated throughout session about proper posture and technique with exercises. Improved exercise technique, movement at target joints, use of target muscles after min to mod verbal, visual, tactile cues.    Patient's session is limited due to having to leave slightly early for doctors appointment. Patient is more challenged with prolonged ambulation this session due to back pain, patient reports back pain is worsened with activity due to the  weather. Continued progression of ambulation with decreasing reliance upon AD  Performed. Patient will benefit from skilled physical therapy to increase stability, strength, and mobility for improved quality of life and independence with ADL and iADLs               PT Education - 05/13/19 1708    Education Details  exercise technique, body mechanics    Person(s) Educated  Patient    Methods  Explanation;Demonstration;Tactile cues;Verbal cues    Comprehension  Verbalized understanding;Returned demonstration;Verbal cues required;Tactile cues required       PT Short Term Goals - 05/08/19 1325      PT SHORT TERM GOAL #1   Title  Patient will be independent in home exercise program to improve strength/mobility for better functional independence with ADLs.    Baseline  3/3 HEP next session 4/12 HEP compliant 4/28: compliant    Time  2    Period  Weeks    Status  Partially Met    Target Date  05/06/19      PT SHORT TERM GOAL #2   Title  Patient will ambulate for 6 minutes without seated rest break to increase tolerance for 6 minute walk test    Baseline  3/3: only able to ambulate 3 minutes and 59 seconds prior to rest break 4/12 6 min walk test deferred till next session 4/14: 6 minutes with one rest break    Time  2    Period  Weeks    Status  Partially Met    Target Date  05/22/19        PT Long Term Goals - 05/08/19 1325      PT LONG TERM GOAL #1   Title  Patient will increase six minute walk test distance to >1000 with LRAD for progression to community ambulator and improve gait ability    Baseline  3/3: 700 ft, terminated early 4/12: deferred due to walking down to PT gym 4/14: 740 ft 6 minutes with one seated rest break    Time  8    Period  Weeks    Status  Partially Met    Target Date  07/03/19      PT LONG TERM GOAL #2   Title  Patient will increase FOTO score to equal to or greater than 57/100 to demonstrate statistically significant improved mobility and  quality of life.    Baseline  3/3: 47/100 risk adjusted 43/100 4/12: 41/100 4/28: 50/100    Time  8    Period  Weeks    Status  Partially Met    Target Date  07/03/19      PT LONG TERM GOAL #3   Title  Patient will increase Berg Balance score by > 6 points (47/56) to demonstrate decreased  fall risk during functional activities.    Baseline  3/3: 41/56 4/12; 49/56    Time  8    Period  Weeks    Status  Achieved      PT LONG TERM GOAL #4   Title  Patient will increase BLE gross strength to 4+/5 as to improve functional strength for independent gait, increased standing tolerance and increased ADL ability.    Baseline  3/3: LLE grossly 4-/5 R grossly 3+ hip 4- knee 4/12: LLE grossly 4- to 4/5 with R 4- hip    Time  8    Period  Weeks    Status  Partially Met    Target Date  07/03/19      PT LONG TERM GOAL #5   Title  Patient will increase dynamic gait index score to >19/24 as to demonstrate reduced fall risk and improved dynamic gait balance for better safety with community/home ambulation.    Baseline  4/28: 14/24    Time  8    Period  Weeks    Status  New    Target Date  07/03/19            Plan - 05/13/19 1711    Clinical Impression Statement  Patient's session is limited due to having to leave slightly early for doctors appointment. Patient is more challenged with prolonged ambulation this session due to back pain, patient reports back pain is worsened with activity due to the weather. Continued progression of ambulation with decreasing reliance upon AD  Performed. Patient will benefit from skilled physical therapy to increase stability, strength, and mobility for improved quality of life and independence with ADL and iADLs    Personal Factors and Comorbidities  Age;Comorbidity 3+;Finances;Fitness;Past/Current Experience;Social Background;Time since onset of injury/illness/exacerbation;Transportation    Comorbidities  DM, heart murmur, HLD, HTN, sleep apnea, cellulitis of L  foot, hypomagnesemia, unilateral complete BKA R (2019), stage III CKD, PAD, legally blind, hypokalemia, AKI, and sepsis    Examination-Activity Limitations  Bathing;Bed Mobility;Caring for [employer];Locomotion Level;Lift;Stand;Toileting;Transfers    Examination-Participation Restrictions  Church;Cleaning;Community Activity;Driving;Interpersonal Relationship;Laundry;Shop;Meal Prep;Yard Work    [employer]chant [employer] officer  Evolving/Moderate complexity    Rehab Potential  Fair    PT Frequency  2x / week    PT Duration  8 weeks    PT Treatment/Interventions  ADLs/Self Care Home Management;Cryotherapy;Electrical Stimulation;Ultrasound;Traction;Moist Heat;DME Instruction;Gait training;Stair training;Functional mobility training;Therapeutic activities;Therapeutic exercise;Patient/family education;Neuromuscular re-education;Balance training;Prosthetic Training;Wheelchair mobility training;Manual techniques;Manual lymph drainage;Compression bandaging;Taping;Energy conservation;Passive range of motion;Aquatic Therapy;Vestibular    PT Next Visit Plan  give HEP, prone    Consulted and Agree with Plan of Care  Patient       Patient will benefit from skilled therapeutic intervention in order to improve the following deficits and impairments:  Abnormal gait, Decreased activity tolerance, Decreased balance, Decreased knowledge of precautions, Decreased endurance, Decreased knowledge of use of DME, Decreased mobility, Decreased range of motion, Difficulty walking, Decreased strength, Increased edema, Impaired flexibility, Impaired perceived functional ability, Prosthetic Dependency, Postural dysfunction, Improper body mechanics, Pain, Decreased skin integrity, Impaired vision/preception, Impaired sensation  Visit Diagnosis: Other abnormalities of gait and mobility  Muscle weakness (generalized)  Unsteadiness on feet  Unilateral complete BKA, right, sequela  ([place])     Problem List Patient Active Problem List   Diagnosis Date Noted  . Elevated lactic acid level   . Diabetic ketoacidosis without coma associated with type 2 diabetes mellitus ([place])   . Cellulitis of left foot 12/17/2018  . Pain due to onychomycosis of  toenail of right foot 07/05/2018  . Abnormality of gait 10/12/2017  . Type 2 diabetes mellitus with hyperglycemia, with long-term current use of insulin ([place])   . Flatulence   . Hypomagnesemia   . Unilateral complete BKA, right, sequela ([place])   . Benign essential HTN   . Hypoalbuminemia due to protein-calorie malnutrition ([place])   . S/P below knee amputation, right ([place]) 04/12/2017  . Acute blood loss anemia   . Other encephalopathy 04/08/2017  . Encephalopathy 04/08/2017  . Altered mental status 04/08/2017  . Labile blood pressure   . Labile blood glucose   . Drug induced constipation   . Stage 3 chronic kidney disease   . Bacteremia   . S/P unilateral BKA (below knee amputation), right ([place]) 04/04/2017  . PAD (peripheral artery disease) ([place])   . Type 2 diabetes mellitus with right diabetic foot ulcer ([place])   . Post-operative pain   . Legally blind   . Upper GI bleed   . Streptococcal bacteremia 04/01/2017  . Hypokalemia 03/30/2017  . Uncontrolled type 2 diabetes mellitus with hyperglycemia, with long-term current use of insulin ([place]) 03/30/2017  . Type 2 diabetes mellitus with peripheral neuropathy ([place]) 03/30/2017  . AKI (acute kidney injury) ([place]) 03/30/2017  . CKD stage 3 due to type 2 diabetes mellitus ([place]) 03/30/2017  . Sepsis ([place]) 03/30/2017  . Heart murmur 11/22/2016  . Hyperlipidemia associated with type 2 diabetes mellitus ([place]) 11/22/2016  . Obstructive sleep apnea syndrome 11/22/2016  . Essential hypertension 12/17/2012   Janna Arch, PT, DPT   05/13/2019, 5:13 PM  [place] MAIN [facility] SERVICES [address] [place], [place], [address] Phone: [phone]    Fax:  [phone]  Name: Trevor [name] MRN: [id number] Date of Birth: Jun 23, 1955

## 2019-05-14 ENCOUNTER — Ambulatory Visit: Payer: Medicare Other | Admitting: Podiatry

## 2019-05-15 ENCOUNTER — Ambulatory Visit: Payer: Medicare Other

## 2019-05-16 ENCOUNTER — Encounter: Payer: Medicare Other | Admitting: Physical Medicine & Rehabilitation

## 2019-05-16 NOTE — Telephone Encounter (Signed)
Opened in error

## 2019-05-20 ENCOUNTER — Ambulatory Visit: Payer: Medicare Other

## 2019-05-20 ENCOUNTER — Other Ambulatory Visit: Payer: Self-pay

## 2019-05-20 DIAGNOSIS — R2689 Other abnormalities of gait and mobility: Secondary | ICD-10-CM

## 2019-05-20 DIAGNOSIS — R2681 Unsteadiness on feet: Secondary | ICD-10-CM

## 2019-05-20 DIAGNOSIS — M6281 Muscle weakness (generalized): Secondary | ICD-10-CM

## 2019-05-20 NOTE — Therapy (Signed)
[place] MAIN [facility] SERVICES [address] [place], [place], [address] Phone: [phone]   Fax:  [phone]  Physical Therapy Treatment  Patient Details  Name: Trevor [name] MRN: [id number] Date of Birth: [birth year] Referring Provider (PT): Jamse Arn, MD   Encounter Date: 05/20/2019  PT End of Session - 05/20/19 1319    Visit Number  17    Number of Visits  31    Date for PT Re-Evaluation  07/03/19    PT Start Time  9798   pt arrived late   PT Stop Time  1344    PT Time Calculation (min)  30 min    Equipment Utilized During Treatment  Gait belt;Other (comment)    Activity Tolerance  Patient limited by fatigue;Patient tolerated treatment well    Behavior During Therapy  [facility] for tasks assessed/performed       Past Medical History:  Diagnosis Date  . Diabetes mellitus without complication ([place])   . Heart murmur   . Hyperlipidemia   . Hypertension   . Sleep apnea     Past Surgical History:  Procedure Laterality Date  . AMPUTATION Right 03/31/2017   Procedure: RIGHT FOOT 1ST AND 2ND RAY AMPUTATION;  Surgeon: Newt Minion, MD;  Location: [place];  Service: Orthopedics;  Laterality: Right;  . AMPUTATION Right 04/01/2017   Procedure: AMPUTATION BELOW KNEE;  Surgeon: Newt Minion, MD;  Location: [place];  Service: Orthopedics;  Laterality: Right;  . COLONOSCOPY WITH PROPOFOL N/A 10/02/2017   Procedure: COLONOSCOPY WITH PROPOFOL;  Surgeon: [name], MD;  Location: [facility] ENDOSCOPY;  Service: Gastroenterology;  Laterality: N/A;  . CORNEAL TRANSPLANT      There were no vitals filed for this visit.  Subjective Assessment - 05/20/19 1318    Subjective  Pt reports he's doing well today. Saw foot doctor last week, no major updates. Pt reports continuing to AMB with SPC intermittently at home.    Pertinent History  Patient returning to PT after multiple hospitalizations for L foot and COVID-19. Received home health therapy till  02/14/19  . Patient last seen 12/13/2018 in this outpatient clinic. PMH includes DM, heart murmur, HLD, HTN, sleep apnea, cellulitis of L foot, hypomagnesemia, unilateral complete BKA R (2019), stage III CKD, PAD, legally blind, hypokalemia, AKI, and sepsis. Uses wheelchair primarily at home rather than walking with prosthesis.    Currently in Pain?  No/denies        INTERVENTION THIS DATE:  138/73MMhG, 72bpm  -Stair hamstring stretch 40 seconds each LE; BUE support  -Stair hip flexor stretch 40 seconds each LE; BUE support   Ambulate ~ 1000 ft with SPC cueing for upright posture, step length. Hip extension limited bilaterally with resultant increase in low back pain with prolonged ambulation. Close CGA ; 900 ft without AD. Pt had to stop at cafeteria door d/t pain in residual limb. Pt not interested in visual inspection at this time, but educated on self inspection with selfie-mode of smart phone. Pt reports no hand mirrors at home.  0.60ms, intermittent SPC use (mostly without, minGuard assist provided, albeit pt AMB at spervision level)    Standing with UE support: Close CGA and cues for body mechanics and sequencing:    -seated BTB abduction 1x30 each LE  (try doubled BTB next visit)  -seated BTB adduction against PT resistance 25x each LE   Session [facility]     PT  Short Term Goals - 05/08/19 1325      PT SHORT TERM GOAL #1   Title  Patient will be independent in home exercise program to improve strength/mobility for better functional independence with ADLs.    Baseline  3/3 HEP next session 4/12 HEP compliant 4/28: compliant    Time  2    Period  Weeks    Status  Partially Met    Target Date  05/06/19      PT SHORT TERM GOAL #2   Title  Patient will ambulate for 6 minutes without seated rest break to increase tolerance for 6 minute walk test    Baseline  3/3: only able to ambulate 3 minutes and 59 seconds prior to rest break 4/12 6 min  walk test deferred till next session 4/14: 6 minutes with one rest break    Time  2    Period  Weeks    Status  Partially Met    Target Date  05/22/19        PT Long Term Goals - 05/08/19 1325      PT LONG TERM GOAL #1   Title  Patient will increase six minute walk test distance to >1000 with LRAD for progression to community ambulator and improve gait ability    Baseline  3/3: 700 ft, terminated early 4/12: deferred due to walking down to PT gym 4/14: 740 ft 6 minutes with one seated rest break    Time  8    Period  Weeks    Status  Partially Met    Target Date  07/03/19      PT LONG TERM GOAL #2   Title  Patient will increase FOTO score to equal to or greater than 57/100 to demonstrate statistically significant improved mobility and quality of life.    Baseline  3/3: 47/100 risk adjusted 43/100 4/12: 41/100 4/28: 50/100    Time  8    Period  Weeks    Status  Partially Met    Target Date  07/03/19      PT LONG TERM GOAL #3   Title  Patient will increase Berg Balance score by > 6 points (47/56) to demonstrate decreased fall risk during functional activities.    Baseline  3/3: 41/56 4/12; 49/56    Time  8    Period  Weeks    Status  Achieved      PT LONG TERM GOAL #4   Title  Patient will increase BLE gross strength to 4+/5 as to improve functional strength for independent gait, increased standing tolerance and increased ADL ability.    Baseline  3/3: LLE grossly 4-/5 R grossly 3+ hip 4- knee 4/12: LLE grossly 4- to 4/5 with R 4- hip    Time  8    Period  Weeks    Status  Partially Met    Target Date  07/03/19      PT LONG TERM GOAL #5   Title  Patient will increase dynamic gait index score to >19/24 as to demonstrate reduced fall risk and improved dynamic gait balance for better safety with community/home ambulation.    Baseline  4/28: 14/24    Time  8    Period  Weeks    Status  New    Target Date  07/03/19            Plan - 05/20/19 1319    Clinical  Impression Statement Pt arrived later this date with Aunt  Peggy. Pt able to complete most of session as planned with rest breaks provided as needed, but did need to leave a few minutes early to make a medical appointment on time. Pt maintains high level of focus and motivation. PRN verbal, visual, and tactile cues are provided for most accurate form possible. Author provides minA intermittently for full ROM when needed. Overall pt continues to make steady progress toward treatment goals. With limited time, pt elects to focus on resistance band exercises rather than airex pad.     Personal Factors and Comorbidities  Age;Comorbidity 3+;Finances;Fitness;Past/Current Experience;Social Background;Time since onset of injury/illness/exacerbation;Transportation    Comorbidities  DM, heart murmur, HLD, HTN, sleep apnea, cellulitis of L foot, hypomagnesemia, unilateral complete BKA R (2019), stage III CKD, PAD, legally blind, hypokalemia, AKI, and sepsis    Examination-Activity Limitations  Bathing;Bed Mobility;Caring for [employer];Locomotion Level;Lift;Stand;Toileting;Transfers    Examination-Participation Restrictions  Church;Cleaning;Community Activity;Driving;Interpersonal Relationship;Laundry;Shop;Meal Prep;Yard Work    Merchant navy officer  Evolving/Moderate complexity    Clinical Decision Making  Moderate    Rehab Potential  Fair    Clinical Impairments Affecting Rehab Potential  (+) previous independence, motivation to return to walking (-) lives alone, hx of diabetes, limited vision     PT Frequency  2x / week    PT Duration  8 weeks    PT Treatment/Interventions  ADLs/Self Care Home Management;Cryotherapy;Electrical Stimulation;Ultrasound;Traction;Moist Heat;DME Instruction;Gait training;Stair training;Functional mobility training;Therapeutic activities;Therapeutic exercise;Patient/family education;Neuromuscular re-education;Balance  training;Prosthetic Training;Wheelchair mobility training;Manual techniques;Manual lymph drainage;Compression bandaging;Taping;Energy conservation;Passive range of motion;Aquatic Therapy;Vestibular    PT Next Visit Plan  give HEP, prone    PT Home Exercise Plan  see [id number] on medbridge, spc ambulation       Patient will benefit from skilled therapeutic intervention in order to improve the following deficits and impairments:  Abnormal gait, Decreased activity tolerance, Decreased balance, Decreased knowledge of precautions, Decreased endurance, Decreased knowledge of use of DME, Decreased mobility, Decreased range of motion, Difficulty walking, Decreased strength, Increased edema, Impaired flexibility, Impaired perceived functional ability, Prosthetic Dependency, Postural dysfunction, Improper body mechanics, Pain, Decreased skin integrity, Impaired vision/preception, Impaired sensation  Visit Diagnosis: Other abnormalities of gait and mobility  Muscle weakness (generalized)  Unsteadiness on feet     Problem List Patient Active Problem List   Diagnosis Date Noted  . Elevated lactic acid level   . Diabetic ketoacidosis without coma associated with type 2 diabetes mellitus ([place])   . Cellulitis of left foot 12/17/2018  . Pain due to onychomycosis of toenail of right foot 07/05/2018  . Abnormality of gait 10/12/2017  . Type 2 diabetes mellitus with hyperglycemia, with long-term current use of insulin ([place])   . Flatulence   . Hypomagnesemia   . Unilateral complete BKA, right, sequela ([place])   . Benign essential HTN   . Hypoalbuminemia due to protein-calorie malnutrition ([place])   . S/P below knee amputation, right ([place]) 04/12/2017  . Acute blood loss anemia   . Other encephalopathy 04/08/2017  . Encephalopathy 04/08/2017  . Altered mental status 04/08/2017  . Labile blood pressure   . Labile blood glucose   . Drug induced constipation   . Stage 3 chronic kidney disease   .  Bacteremia   . S/P unilateral BKA (below knee amputation), right ([place]) 04/04/2017  . PAD (peripheral artery disease) ([place])   . Type 2 diabetes mellitus with right diabetic foot ulcer ([place])   . Post-operative pain   . Legally blind   . Upper GI bleed   . Streptococcal  bacteremia 04/01/2017  . Hypokalemia 03/30/2017  . Uncontrolled type 2 diabetes mellitus with hyperglycemia, with long-term current use of insulin ([place]) 03/30/2017  . Type 2 diabetes mellitus with peripheral neuropathy ([place]) 03/30/2017  . AKI (acute kidney injury) ([place]) 03/30/2017  . CKD stage 3 due to type 2 diabetes mellitus ([place]) 03/30/2017  . Sepsis ([place]) 03/30/2017  . Heart murmur 11/22/2016  . Hyperlipidemia associated with type 2 diabetes mellitus ([place]) 11/22/2016  . Obstructive sleep apnea syndrome 11/22/2016  . Essential hypertension 12/17/2012   1:58 PM, 05/20/19 Etta Grandchild, PT, DPT Physical Therapist - [facility]  Outpatient Physical Therapy- [place] [phone]     Etta Grandchild 05/20/2019, 1:23 PM  [place] MAIN [facility] SERVICES [address] [place], [place], [address] Phone: [phone]   Fax:  [phone]  Name: RAMIN ZOLL MRN: [id number] Date of Birth: 27-Feb-1955

## 2019-05-22 ENCOUNTER — Ambulatory Visit: Payer: Medicare Other

## 2019-05-27 ENCOUNTER — Other Ambulatory Visit: Payer: Self-pay

## 2019-05-27 ENCOUNTER — Ambulatory Visit: Payer: Medicare Other

## 2019-05-27 DIAGNOSIS — M6281 Muscle weakness (generalized): Secondary | ICD-10-CM

## 2019-05-27 DIAGNOSIS — S88111S Complete traumatic amputation at level between knee and ankle, right lower leg, sequela: Secondary | ICD-10-CM

## 2019-05-27 DIAGNOSIS — R2681 Unsteadiness on feet: Secondary | ICD-10-CM

## 2019-05-27 DIAGNOSIS — R2689 Other abnormalities of gait and mobility: Secondary | ICD-10-CM | POA: Diagnosis not present

## 2019-05-27 NOTE — Therapy (Signed)
[place] MAIN [facility] SERVICES [address] [place], [place], [address] Phone: [phone]   Fax:  [phone]  Physical Therapy Treatment  Patient Details  Name: Trevor [name] MRN: [id number] Date of Birth: 1955/08/29 Referring Provider (PT): Jamse Arn, MD   Encounter Date: 05/27/2019  PT End of Session - 05/27/19 1347    Visit Number  18    Number of Visits  31    Date for PT Re-Evaluation  07/03/19    Authorization Type  8/10 PN 4/12    PT Start Time  1259    PT Stop Time  1340    PT Time Calculation (min)  41 min    Equipment Utilized During Treatment  Gait belt;Other (comment)    Activity Tolerance  Patient limited by fatigue;Patient tolerated treatment well    Behavior During Therapy  [facility] for tasks assessed/performed       Past Medical History:  Diagnosis Date  . Diabetes mellitus without complication ([place])   . Heart murmur   . Hyperlipidemia   . Hypertension   . Sleep apnea     Past Surgical History:  Procedure Laterality Date  . AMPUTATION Right 03/31/2017   Procedure: RIGHT FOOT 1ST AND 2ND RAY AMPUTATION;  Surgeon: Newt Minion, MD;  Location: [place];  Service: Orthopedics;  Laterality: Right;  . AMPUTATION Right 04/01/2017   Procedure: AMPUTATION BELOW KNEE;  Surgeon: Newt Minion, MD;  Location: [place];  Service: Orthopedics;  Laterality: Right;  . COLONOSCOPY WITH PROPOFOL N/A 10/02/2017   Procedure: COLONOSCOPY WITH PROPOFOL;  Surgeon: [name], MD;  Location: [facility] ENDOSCOPY;  Service: Gastroenterology;  Laterality: N/A;  . CORNEAL TRANSPLANT      There were no vitals filed for this visit.  Subjective Assessment - 05/27/19 1301    Subjective  Patient continues to have car difficulties, missed last session due to car being in shop. Has to leave early today for another doctor appointment.    Pertinent History  Patient returning to PT after multiple hospitalizations for L foot and COVID-19. Received home  health therapy till 02/14/19  . Patient last seen 12/13/2018 in this outpatient clinic. PMH includes DM, heart murmur, HLD, HTN, sleep apnea, cellulitis of L foot, hypomagnesemia, unilateral complete BKA R (2019), stage III CKD, PAD, legally blind, hypokalemia, AKI, and sepsis. Uses wheelchair primarily at home rather than walking with prosthesis.    Currently in Pain?  No/denies              Vitals at start of session:  146/84 pulse 64       Treat Horizontal head turns 200 ft; close CGA, more challenging looking to L  Vertical head turns 200 ft ; trunk movement with head movement resulting in changes of velocity of ambulation, close CGA  Head turns based on PT commands: up/down/left/right 200 ft. Close CGA, occasional confusion with dual task   Standing rest break between each lap due to shortness of breath and back pain with head turns.     Standing with UE support: Close CGA and cues for body mechanics and sequencing:     -Stair hamstring stretch 40 seconds each LE; BUE support    -Stair hip flexor stretch 40 seconds each LE; BUE support    -airex pad: weight shift 20x   -airex pad; weighted ball raise 12x   -airex pad: weighted ball twist 12x  -airex pad" 6" step toe taps decrease to no UE support  15x  Each LE,    -airex pad: heel toe raises 20x   Modified squat with focus on hip hinge with upright trunk and partial depth 10x, BUE support.     seated: cues for body mechanics and sequencing for optimal muscle recruitment   swiss ball forward rollout 10x 10 second holds  Seated BTB abduction 15x each LE  Seated BTB adduction against PT resistance 15x each LE    Pt educated throughout session about proper posture and technique with exercises. Improved exercise technique, movement at target joints, use of target muscles after min to mod verbal, visual, tactile cues.                         PT Education - 05/27/19 1347    Education Details  exercise  technique, body mechanics    Person(s) Educated  Patient    Methods  Explanation;Demonstration;Tactile cues;Verbal cues    Comprehension  Verbalized understanding;Returned demonstration;Verbal cues required;Tactile cues required       PT Short Term Goals - 05/08/19 1325      PT SHORT TERM GOAL #1   Title  Patient will be independent in home exercise program to improve strength/mobility for better functional independence with ADLs.    Baseline  3/3 HEP next session 4/12 HEP compliant 4/28: compliant    Time  2    Period  Weeks    Status  Partially Met    Target Date  05/06/19      PT SHORT TERM GOAL #2   Title  Patient will ambulate for 6 minutes without seated rest break to increase tolerance for 6 minute walk test    Baseline  3/3: only able to ambulate 3 minutes and 59 seconds prior to rest break 4/12 6 min walk test deferred till next session 4/14: 6 minutes with one rest break    Time  2    Period  Weeks    Status  Partially Met    Target Date  05/22/19        PT Long Term Goals - 05/08/19 1325      PT LONG TERM GOAL #1   Title  Patient will increase six minute walk test distance to >1000 with LRAD for progression to community ambulator and improve gait ability    Baseline  3/3: 700 ft, terminated early 4/12: deferred due to walking down to PT gym 4/14: 740 ft 6 minutes with one seated rest break    Time  8    Period  Weeks    Status  Partially Met    Target Date  07/03/19      PT LONG TERM GOAL #2   Title  Patient will increase FOTO score to equal to or greater than 57/100 to demonstrate statistically significant improved mobility and quality of life.    Baseline  3/3: 47/100 risk adjusted 43/100 4/12: 41/100 4/28: 50/100    Time  8    Period  Weeks    Status  Partially Met    Target Date  07/03/19      PT LONG TERM GOAL #3   Title  Patient will increase Berg Balance score by > 6 points (47/56) to demonstrate decreased fall risk during functional activities.     Baseline  3/3: 41/56 4/12; 49/56    Time  8    Period  Weeks    Status  Achieved      PT LONG TERM GOAL #  4   Title  Patient will increase BLE gross strength to 4+/5 as to improve functional strength for independent gait, increased standing tolerance and increased ADL ability.    Baseline  3/3: LLE grossly 4-/5 R grossly 3+ hip 4- knee 4/12: LLE grossly 4- to 4/5 with R 4- hip    Time  8    Period  Weeks    Status  Partially Met    Target Date  07/03/19      PT LONG TERM GOAL #5   Title  Patient will increase dynamic gait index score to >19/24 as to demonstrate reduced fall risk and improved dynamic gait balance for better safety with community/home ambulation.    Baseline  4/28: 14/24    Time  8    Period  Weeks    Status  New    Target Date  07/03/19            Plan - 05/27/19 1509    Clinical Impression Statement  Patient is challenged with ambulation with head turns with increased episodes of instability/fatigue with dual task. Continued progression of unstable surface mobility performed with patient tolerating well despite occasional episodes of back pain.  Patient will benefit from skilled physical therapy to increase stability, strength, and mobility for improved quality of life and independence with ADL and iADLs    Personal Factors and Comorbidities  Age;Comorbidity 3+;Finances;Fitness;Past/Current Experience;Social Background;Time since onset of injury/illness/exacerbation;Transportation    Comorbidities  DM, heart murmur, HLD, HTN, sleep apnea, cellulitis of L foot, hypomagnesemia, unilateral complete BKA R (2019), stage III CKD, PAD, legally blind, hypokalemia, AKI, and sepsis    Examination-Activity Limitations  Bathing;Bed Mobility;Caring for [employer];Locomotion Level;Lift;Stand;Toileting;Transfers    Examination-Participation Restrictions  Church;Cleaning;Community Activity;Driving;Interpersonal  Relationship;Laundry;Shop;Meal Prep;Yard Work    [employer]chant [employer] officer  Evolving/Moderate complexity    Rehab Potential  Fair    PT Frequency  2x / week    PT Duration  8 weeks    PT Treatment/Interventions  ADLs/Self Care Home Management;Cryotherapy;Electrical Stimulation;Ultrasound;Traction;Moist Heat;DME Instruction;Gait training;Stair training;Functional mobility training;Therapeutic activities;Therapeutic exercise;Patient/family education;Neuromuscular re-education;Balance training;Prosthetic Training;Wheelchair mobility training;Manual techniques;Manual lymph drainage;Compression bandaging;Taping;Energy conservation;Passive range of motion;Aquatic Therapy;Vestibular    PT Next Visit Plan  walk on beach    Consulted and Agree with Plan of Care  Patient       Patient will benefit from skilled therapeutic intervention in order to improve the following deficits and impairments:  Abnormal gait, Decreased activity tolerance, Decreased balance, Decreased knowledge of precautions, Decreased endurance, Decreased knowledge of use of DME, Decreased mobility, Decreased range of motion, Difficulty walking, Decreased strength, Increased edema, Impaired flexibility, Impaired perceived functional ability, Prosthetic Dependency, Postural dysfunction, Improper body mechanics, Pain, Decreased skin integrity, Impaired vision/preception, Impaired sensation  Visit Diagnosis: Other abnormalities of gait and mobility  Muscle weakness (generalized)  Unsteadiness on feet  Unilateral complete BKA, right, sequela ([place])     Problem List Patient Active Problem List   Diagnosis Date Noted  . Elevated lactic acid level   . Diabetic ketoacidosis without coma associated with type 2 diabetes mellitus ([place])   . Cellulitis of left foot 12/17/2018  . Pain due to onychomycosis of toenail of right foot 07/05/2018  . Abnormality of gait 10/12/2017  . Type 2 diabetes mellitus with hyperglycemia, with  long-term current use of insulin ([place])   . Flatulence   . Hypomagnesemia   . Unilateral complete BKA, right, sequela ([place])   . Benign essential HTN   . Hypoalbuminemia due to protein-calorie malnutrition (  [place])   . S/P below knee amputation, right ([place]) 04/12/2017  . Acute blood loss anemia   . Other encephalopathy 04/08/2017  . Encephalopathy 04/08/2017  . Altered mental status 04/08/2017  . Labile blood pressure   . Labile blood glucose   . Drug induced constipation   . Stage 3 chronic kidney disease   . Bacteremia   . S/P unilateral BKA (below knee amputation), right ([place]) 04/04/2017  . PAD (peripheral artery disease) ([place])   . Type 2 diabetes mellitus with right diabetic foot ulcer ([place])   . Post-operative pain   . Legally blind   . Upper GI bleed   . Streptococcal bacteremia 04/01/2017  . Hypokalemia 03/30/2017  . Uncontrolled type 2 diabetes mellitus with hyperglycemia, with long-term current use of insulin ([place]) 03/30/2017  . Type 2 diabetes mellitus with peripheral neuropathy ([place]) 03/30/2017  . AKI (acute kidney injury) ([place]) 03/30/2017  . CKD stage 3 due to type 2 diabetes mellitus ([place]) 03/30/2017  . Sepsis ([place]) 03/30/2017  . Heart murmur 11/22/2016  . Hyperlipidemia associated with type 2 diabetes mellitus ([place]) 11/22/2016  . Obstructive sleep apnea syndrome 11/22/2016  . Essential hypertension 12/17/2012   Janna Arch, PT, DPT   05/27/2019, 3:11 PM  [place] MAIN [facility] SERVICES [address] [place], [place], [address] Phone: [phone]   Fax:  [phone]  Name: Trevor [name] MRN: [id number] Date of Birth: 1955-02-02

## 2019-05-29 ENCOUNTER — Ambulatory Visit: Payer: Medicare Other

## 2019-05-29 DIAGNOSIS — R2681 Unsteadiness on feet: Secondary | ICD-10-CM

## 2019-05-29 DIAGNOSIS — R2689 Other abnormalities of gait and mobility: Secondary | ICD-10-CM | POA: Diagnosis not present

## 2019-05-29 DIAGNOSIS — S88111S Complete traumatic amputation at level between knee and ankle, right lower leg, sequela: Secondary | ICD-10-CM

## 2019-05-29 DIAGNOSIS — M6281 Muscle weakness (generalized): Secondary | ICD-10-CM

## 2019-05-29 NOTE — Therapy (Signed)
[place] MAIN [facility] SERVICES [address] [place], [place], [address] Phone: [phone]   Fax:  [phone]  Physical Therapy Treatment  Patient Details  Name: Trevor [name] MRN: [id number] Date of Birth: 05-14-1955 Referring Provider (PT): Jamse Arn, MD   Encounter Date: 05/29/2019  PT End of Session - 05/29/19 1312    Visit Number  19    Number of Visits  31    Date for PT Re-Evaluation  07/03/19    Authorization Type  9/10 PN 4/12    PT Start Time  1302    PT Stop Time  1344    PT Time Calculation (min)  42 min    Equipment Utilized During Treatment  Gait belt;Other (comment)    Activity Tolerance  Patient limited by fatigue;Patient tolerated treatment well    Behavior During Therapy  [facility] for tasks assessed/performed       Past Medical History:  Diagnosis Date  . Diabetes mellitus without complication ([place])   . Heart murmur   . Hyperlipidemia   . Hypertension   . Sleep apnea     Past Surgical History:  Procedure Laterality Date  . AMPUTATION Right 03/31/2017   Procedure: RIGHT FOOT 1ST AND 2ND RAY AMPUTATION;  Surgeon: Newt Minion, MD;  Location: [place];  Service: Orthopedics;  Laterality: Right;  . AMPUTATION Right 04/01/2017   Procedure: AMPUTATION BELOW KNEE;  Surgeon: Newt Minion, MD;  Location: [place];  Service: Orthopedics;  Laterality: Right;  . COLONOSCOPY WITH PROPOFOL N/A 10/02/2017   Procedure: COLONOSCOPY WITH PROPOFOL;  Surgeon: [name], MD;  Location: [facility] ENDOSCOPY;  Service: Gastroenterology;  Laterality: N/A;  . CORNEAL TRANSPLANT      There were no vitals filed for this visit.  Subjective Assessment - 05/29/19 1311    Subjective  Patient reports severe pain in residual limb when wearing prosthesis. Is seeing prosthesis tomorrow.    Pertinent History  Patient returning to PT after multiple hospitalizations for L foot and COVID-19. Received home health therapy till 02/14/19  . Patient last  seen 12/13/2018 in this outpatient clinic. PMH includes DM, heart murmur, HLD, HTN, sleep apnea, cellulitis of L foot, hypomagnesemia, unilateral complete BKA R (2019), stage III CKD, PAD, legally blind, hypokalemia, AKI, and sepsis. Uses wheelchair primarily at home rather than walking with prosthesis.    Currently in Pain?  Yes    Pain Score  7     Pain Location  Other (Comment)   residual limb   Pain Orientation  Right;Lower    Pain Descriptors / Indicators  Tender;Throbbing    Pain Type  Chronic pain    Pain Onset  In the past 7 days    Pain Frequency  Intermittent    Aggravating Factors   wearing prosthesis weightbearing          Patient reports severe pain in residual limb when wearing prosthesis. Is seeing prosthesis tomorrow.   Vitals start of session:129/80 pulse 68   Nonweightbearing interventions performed: Supine with wedge under head: -SLR 10x each LE; 2 sets; cues for body mechanics. opp LE in hooklying  -abduction straight leg with opp LE in hooklying 10x each LE, 2 sets, cues for keeping LLE neutral with toes up -marching with posterior pelvic tilt 10x each LE   -SAQ hold 5 second over bolster 15x each LE  Hamstring lengthening, limb on PT shoulder with progressive overpressure 60 seconds each LE; not tolerated on RLE due to  pain where leg hits prosthesis.   Seated:  5lb ankle weight only on LLE due to pain with RLE  -LAQ 15x RLE 10x LLE -marching 15x RLE, 10x LLE  -gluteal squeezes 15  -GTB adduction single limb 15x each LE -modified windmill 10x   Patient's session is limited by pain in weightbearing in residual limb. Patient is to see prosthetist tomorrow and ortho for followup on pain. Patient tolerated strengthening nonweightbearing interventions well. Patient will benefit from skilled physical therapy to increase stability, strength, and mobility for improved quality of life and independence with ADL and iADLs                PT Education -  05/29/19 1312    Education Details  exercise technique, body mechanics    Person(s) Educated  Patient    Methods  Explanation;Demonstration;Tactile cues;Verbal cues    Comprehension  Verbalized understanding;Returned demonstration;Verbal cues required;Tactile cues required       PT Short Term Goals - 05/08/19 1325      PT SHORT TERM GOAL #1   Title  Patient will be independent in home exercise program to improve strength/mobility for better functional independence with ADLs.    Baseline  3/3 HEP next session 4/12 HEP compliant 4/28: compliant    Time  2    Period  Weeks    Status  Partially Met    Target Date  05/06/19      PT SHORT TERM GOAL #2   Title  Patient will ambulate for 6 minutes without seated rest break to increase tolerance for 6 minute walk test    Baseline  3/3: only able to ambulate 3 minutes and 59 seconds prior to rest break 4/12 6 min walk test deferred till next session 4/14: 6 minutes with one rest break    Time  2    Period  Weeks    Status  Partially Met    Target Date  05/22/19        PT Long Term Goals - 05/08/19 1325      PT LONG TERM GOAL #1   Title  Patient will increase six minute walk test distance to >1000 with LRAD for progression to community ambulator and improve gait ability    Baseline  3/3: 700 ft, terminated early 4/12: deferred due to walking down to PT gym 4/14: 740 ft 6 minutes with one seated rest break    Time  8    Period  Weeks    Status  Partially Met    Target Date  07/03/19      PT LONG TERM GOAL #2   Title  Patient will increase FOTO score to equal to or greater than 57/100 to demonstrate statistically significant improved mobility and quality of life.    Baseline  3/3: 47/100 risk adjusted 43/100 4/12: 41/100 4/28: 50/100    Time  8    Period  Weeks    Status  Partially Met    Target Date  07/03/19      PT LONG TERM GOAL #3   Title  Patient will increase Berg Balance score by > 6 points (47/56) to demonstrate decreased  fall risk during functional activities.    Baseline  3/3: 41/56 4/12; 49/56    Time  8    Period  Weeks    Status  Achieved      PT LONG TERM GOAL #4   Title  Patient will increase BLE gross strength to 4+/5 as  to improve functional strength for independent gait, increased standing tolerance and increased ADL ability.    Baseline  3/3: LLE grossly 4-/5 R grossly 3+ hip 4- knee 4/12: LLE grossly 4- to 4/5 with R 4- hip    Time  8    Period  Weeks    Status  Partially Met    Target Date  07/03/19      PT LONG TERM GOAL #5   Title  Patient will increase dynamic gait index score to >19/24 as to demonstrate reduced fall risk and improved dynamic gait balance for better safety with community/home ambulation.    Baseline  4/28: 14/24    Time  8    Period  Weeks    Status  New    Target Date  07/03/19            Plan - 05/29/19 1330    Clinical Impression Statement  Patient's session is limited by pain in weightbearing in residual limb. Patient is to see prosthetist tomorrow and ortho for followup on pain. Patient tolerated strengthening nonweightbearing interventions well. Patient will benefit from skilled physical therapy to increase stability, strength, and mobility for improved quality of life and independence with ADL and iADLs    Personal Factors and Comorbidities  Age;Comorbidity 3+;Finances;Fitness;Past/Current Experience;Social Background;Time since onset of injury/illness/exacerbation;Transportation    Comorbidities  DM, heart murmur, HLD, HTN, sleep apnea, cellulitis of L foot, hypomagnesemia, unilateral complete BKA R (2019), stage III CKD, PAD, legally blind, hypokalemia, AKI, and sepsis    Examination-Activity Limitations  Bathing;Bed Mobility;Caring for [employer];Locomotion Level;Lift;Stand;Toileting;Transfers    Examination-Participation Restrictions  Church;Cleaning;Community Activity;Driving;Interpersonal  Relationship;Laundry;Shop;Meal Prep;Yard Work    [employer]chant [employer] officer  Evolving/Moderate complexity    Rehab Potential  Fair    PT Frequency  2x / week    PT Duration  8 weeks    PT Treatment/Interventions  ADLs/Self Care Home Management;Cryotherapy;Electrical Stimulation;Ultrasound;Traction;Moist Heat;DME Instruction;Gait training;Stair training;Functional mobility training;Therapeutic activities;Therapeutic exercise;Patient/family education;Neuromuscular re-education;Balance training;Prosthetic Training;Wheelchair mobility training;Manual techniques;Manual lymph drainage;Compression bandaging;Taping;Energy conservation;Passive range of motion;Aquatic Therapy;Vestibular    PT Next Visit Plan  walk on beach    Consulted and Agree with Plan of Care  Patient       Patient will benefit from skilled therapeutic intervention in order to improve the following deficits and impairments:  Abnormal gait, Decreased activity tolerance, Decreased balance, Decreased knowledge of precautions, Decreased endurance, Decreased knowledge of use of DME, Decreased mobility, Decreased range of motion, Difficulty walking, Decreased strength, Increased edema, Impaired flexibility, Impaired perceived functional ability, Prosthetic Dependency, Postural dysfunction, Improper body mechanics, Pain, Decreased skin integrity, Impaired vision/preception, Impaired sensation  Visit Diagnosis: Other abnormalities of gait and mobility  Muscle weakness (generalized)  Unsteadiness on feet  Unilateral complete BKA, right, sequela ([place])     Problem List Patient Active Problem List   Diagnosis Date Noted  . Elevated lactic acid level   . Diabetic ketoacidosis without coma associated with type 2 diabetes mellitus ([place])   . Cellulitis of left foot 12/17/2018  . Pain due to onychomycosis of toenail of right foot 07/05/2018  . Abnormality of gait 10/12/2017  . Type 2 diabetes mellitus with hyperglycemia, with  long-term current use of insulin ([place])   . Flatulence   . Hypomagnesemia   . Unilateral complete BKA, right, sequela ([place])   . Benign essential HTN   . Hypoalbuminemia due to protein-calorie malnutrition ([place])   . S/P below knee amputation, right ([place]) 04/12/2017  . Acute blood loss anemia   .  Other encephalopathy 04/08/2017  . Encephalopathy 04/08/2017  . Altered mental status 04/08/2017  . Labile blood pressure   . Labile blood glucose   . Drug induced constipation   . Stage 3 chronic kidney disease   . Bacteremia   . S/P unilateral BKA (below knee amputation), right ([place]) 04/04/2017  . PAD (peripheral artery disease) ([place])   . Type 2 diabetes mellitus with right diabetic foot ulcer ([place])   . Post-operative pain   . Legally blind   . Upper GI bleed   . Streptococcal bacteremia 04/01/2017  . Hypokalemia 03/30/2017  . Uncontrolled type 2 diabetes mellitus with hyperglycemia, with long-term current use of insulin ([place]) 03/30/2017  . Type 2 diabetes mellitus with peripheral neuropathy ([place]) 03/30/2017  . AKI (acute kidney injury) ([place]) 03/30/2017  . CKD stage 3 due to type 2 diabetes mellitus ([place]) 03/30/2017  . Sepsis ([place]) 03/30/2017  . Heart murmur 11/22/2016  . Hyperlipidemia associated with type 2 diabetes mellitus ([place]) 11/22/2016  . Obstructive sleep apnea syndrome 11/22/2016  . Essential hypertension 12/17/2012   Janna Arch, PT, DPT   05/29/2019, 1:44 PM  [place] MAIN [facility] SERVICES [address] [place], [place], [address] Phone: [phone]   Fax:  [phone]  Name: Trevor [name] MRN: [id number] Date of Birth: [birth year]

## 2019-05-30 ENCOUNTER — Other Ambulatory Visit: Payer: Self-pay

## 2019-05-30 ENCOUNTER — Encounter: Payer: Self-pay | Admitting: Physical Medicine & Rehabilitation

## 2019-05-30 ENCOUNTER — Encounter: Payer: Medicare Other | Attending: Physical Medicine & Rehabilitation | Admitting: Physical Medicine & Rehabilitation

## 2019-05-30 VITALS — BP 144/81 | HR 68 | Temp 98.2°F | Ht 76.0 in | Wt 327.2 lb

## 2019-05-30 DIAGNOSIS — R269 Unspecified abnormalities of gait and mobility: Secondary | ICD-10-CM | POA: Insufficient documentation

## 2019-05-30 DIAGNOSIS — S88111S Complete traumatic amputation at level between knee and ankle, right lower leg, sequela: Secondary | ICD-10-CM | POA: Diagnosis present

## 2019-05-30 NOTE — Progress Notes (Signed)
Subjective:    Patient ID: Trevor [name], male    DOB: [birth year], 64 y.o.   MRN: [id number]  HPI  Right-handed male with history of diabetes mellitus, hypertension, legally blind, and CKD presents for follow up for right BKA.    Last clinic visit 11/09/18.  Since that time, pt states he had been doing well until recently, when his stump started hurting.  He states he saw [employer] this AM and adjustments were made to the prosthesis. Denies falls. CBGs have been controlled.  Pain Inventory Average Pain 3 Pain Right Now 0 My pain is intermittent and burning  In the last 24 hours, has pain interfered with the following? General activity 0 Relation with others 0 Enjoyment of life 1 What TIME of day is your pain at its worst? daytime Sleep (in general) Fair  Pain is worse with: walking Pain improves with: rest Relief from Meds: 10  Mobility walk with assistance use a cane ability to climb steps?  yes do you drive?  yes  Function disabled: date disabled . retired I need assistance with the following:  dressing, meal prep, household duties and shopping  Neuro/Psych trouble walking  Physicians involved in your care Any changes since last visit?  no   No family history on file. Social History   Socioeconomic History  . Marital status: Single    Spouse name: Not on file  . Number of children: Not on file  . Years of education: Not on file  . Highest education level: Not on file  Occupational History  . Not on file  Tobacco Use  . Smoking status: Never Smoker  . Smokeless tobacco: Never Used  Substance and Sexual Activity  . Alcohol use: No  . Drug use: Never  . Sexual activity: Not on file  Other Topics Concern  . Not on file  Social History Narrative  . Not on file   Social Determinants of Health   Financial Resource Strain:   . Difficulty of Paying Living Expenses:   Food Insecurity:   . Worried About Charity fundraiser in the Last Year:   . Arts development officer in the Last Year:   Transportation Needs:   . Film/video editor (Medical):   [name] Lack of Transportation (Non-Medical):   Physical Activity:   . Days of Exercise per Week:   . Minutes of Exercise per Session:   Stress:   . Feeling of Stress :   Social Connections:   . Frequency of Communication with Friends and Family:   . Frequency of Social Gatherings with Friends and Family:   . Attends Religious Services:   . Active Member of Clubs or Organizations:   . Attends Archivist Meetings:   [name] Marital Status:    Past Surgical History:  Procedure Laterality Date  . AMPUTATION Right 03/31/2017   Procedure: RIGHT FOOT 1ST AND 2ND RAY AMPUTATION;  Surgeon: Newt Minion, MD;  Location: [facility];  Service: Orthopedics;  Laterality: Right;  . AMPUTATION Right 04/01/2017   Procedure: AMPUTATION BELOW KNEE;  Surgeon: Newt Minion, MD;  Location: [facility];  Service: Orthopedics;  Laterality: Right;  . COLONOSCOPY WITH PROPOFOL N/A 10/02/2017   Procedure: COLONOSCOPY WITH PROPOFOL;  Surgeon: [name], MD;  Location: [facility] ENDOSCOPY;  Service: Gastroenterology;  Laterality: N/A;  . CORNEAL TRANSPLANT     Past Medical History:  Diagnosis Date  . Diabetes mellitus without complication ([place])   . Heart murmur   .  Hyperlipidemia   . Hypertension   . Sleep apnea    BP (!) 144/81   Pulse 68   Temp 98.2 F (36.8 C)   Ht 6\' 4"  (1.93 m)   Wt (!) 327 lb 3.2 oz (148.4 kg)   SpO2 95%   BMI 39.83 kg/m   Opioid Risk Score:   Fall Risk Score:  `1  Depression screen PHQ 2/9  Depression screen [facility] 2/9 10/12/2017 08/10/2017 06/26/2017 05/11/2017  Decreased Interest 0 0 0 0  Down, Depressed, Hopeless 0 0 0 0  PHQ - 2 Score 0 0 0 0  Altered sleeping - - 1 1  Tired, decreased energy - - 1 0  Change in appetite - - - 0  Feeling bad or failure about yourself  - - 0 0  Trouble concentrating - - 0 0  Moving slowly or fidgety/restless - - 0 0  Suicidal thoughts - - 0 0  PHQ-9 Score -  - 2 1  Some recent data might be hidden    Review of Systems  Constitutional: Negative.   HENT: Negative.   Eyes: Negative.   Respiratory: Negative.   Cardiovascular: Negative.   Gastrointestinal: Negative.   Endocrine: Negative.        High blood sugar  Genitourinary: Negative.   Musculoskeletal: Positive for back pain and gait problem.  Skin: Negative.   Allergic/Immunologic: Negative.   Neurological:       Tingling   Hematological: Negative.   Psychiatric/Behavioral: Negative.       Objective:   Physical Exam  Constitutional: No distress . Vital signs reviewed. HENT: Normocephalic.  Atraumatic. Eyes: EOMI. No discharge. Cardiovascular: No JVD. Respiratory: Normal effort.  No stridor. GI: Non-distended. Skin: Warm and dry.  Intact. Psych: Normal mood.  Normal behavior. Musc: No edema in extremities.  Mild tenderness at distal stump. [name] Neuro: Alert Motor:  LLE: 5/5 proximal to distal  Right HF, KE: 5/5  Skin: BKA healed, no erythema, warmth Psychiatric: Alert and appropriate    Assessment & Plan:  Right-handed male with history of diabetes mellitus, hypertension, legally blind, and CKD presents for follow up for right BKA.    1.  Right transtibial amputation              Continue PT for gait training             Released by Ortho             New prosthesis obtained ~06/2018, adjustments this AM to prosthesis with improved pain and ambulation  2. Gait abnormality             Continue therapies             Cont cane for safety  Patient would like to return in 6 months for reeval

## 2019-06-03 ENCOUNTER — Other Ambulatory Visit: Payer: Self-pay

## 2019-06-03 ENCOUNTER — Ambulatory Visit: Payer: Medicare Other

## 2019-06-03 DIAGNOSIS — S88111S Complete traumatic amputation at level between knee and ankle, right lower leg, sequela: Secondary | ICD-10-CM

## 2019-06-03 DIAGNOSIS — R2689 Other abnormalities of gait and mobility: Secondary | ICD-10-CM

## 2019-06-03 DIAGNOSIS — M6281 Muscle weakness (generalized): Secondary | ICD-10-CM

## 2019-06-03 DIAGNOSIS — R2681 Unsteadiness on feet: Secondary | ICD-10-CM

## 2019-06-03 NOTE — Therapy (Signed)
[name] MAIN [facility] SERVICES [place], [place], [address] Phone: [phone]   Fax:  [phone]  Physical Therapy Treatment Physical Therapy Progress Note   Dates of reporting period  04/22/19   to   06/03/19   Patient Details  Name: Trevor [name] MRN: [id number] Date of Birth: 1955-12-11 Referring Provider (PT): Jamse Arn, MD   Encounter Date: 06/03/2019  PT End of Session - 06/03/19 1321    Visit Number  20    Number of Visits  31    Date for PT Re-Evaluation  07/03/19    Authorization Type  10/10 PN 4/12; next session 1/10 PN 06/03/19    PT Start Time  1259    PT Stop Time  1340    PT Time Calculation (min)  41 min    Equipment Utilized During Treatment  Gait belt;Other (comment)    Activity Tolerance  Patient limited by fatigue;Patient tolerated treatment well    Behavior During Therapy  [facility] for tasks assessed/performed       Past Medical History:  Diagnosis Date  . Diabetes mellitus without complication ([place])   . Heart murmur   . Hyperlipidemia   . Hypertension   . Sleep apnea     Past Surgical History:  Procedure Laterality Date  . AMPUTATION Right 03/31/2017   Procedure: RIGHT FOOT 1ST AND 2ND RAY AMPUTATION;  Surgeon: Newt Minion, MD;  Location: [place];  Service: Orthopedics;  Laterality: Right;  . AMPUTATION Right 04/01/2017   Procedure: AMPUTATION BELOW KNEE;  Surgeon: Newt Minion, MD;  Location: [place];  Service: Orthopedics;  Laterality: Right;  . COLONOSCOPY WITH PROPOFOL N/A 10/02/2017   Procedure: COLONOSCOPY WITH PROPOFOL;  Surgeon: [name], MD;  Location: [facility] ENDOSCOPY;  Service: Gastroenterology;  Laterality: N/A;  . CORNEAL TRANSPLANT      There were no vitals filed for this visit.  Subjective Assessment - 06/03/19 1304    Subjective  Patient went to prosthetist and had some adjustements  made. Reports it went on much better today. No falls or LOB since last session.     Pertinent History  Patient returning to PT after multiple hospitalizations for L foot and COVID-19. Received home health therapy till 02/14/19  . Patient last seen 12/13/2018 in this outpatient clinic. PMH includes DM, heart murmur, HLD, HTN, sleep apnea, cellulitis of L foot, hypomagnesemia, unilateral complete BKA R (2019), stage III CKD, PAD, legally blind, hypokalemia, AKI, and sepsis. Uses wheelchair primarily at home rather than walking with prosthesis.    Currently in Pain?  No/denies    Pain Onset  In the past 7 days         [facility] PT Assessment - 06/03/19 0001      Standardized Balance Assessment   Standardized Balance Assessment  Dynamic Gait Index      Dynamic Gait Index   Level Surface  Normal    Change in Gait Speed  Mild Impairment    Gait with Horizontal Head Turns  Normal    Gait with Vertical Head Turns  Mild Impairment    Gait and Pivot Turn  Mild Impairment    Step Over Obstacle  Moderate Impairment    Step Around Obstacles  Mild Impairment    Steps  Mild Impairment    Total Score  17      Adjustments to prosthesis performed, patient reports there were small holes added to it.   Vitals at start of  visit: 141/75    Goals:  6 min walk test 800 ft  FOTO: 46%; challenged with technology of clicking.  BLE strength DGI: 17/ 24    Treatment 5lb ankle weight:  LAQ 12x each LE  Swiss ball forward rollout seated 10x 10 second holds, 10x diagonal each direction    Patient's condition has the potential to improve in response to therapy. Maximum improvement is yet to be obtained. The anticipated improvement is attainable and reasonable in a generally predictable time.  Patient reports he is walking better but not where he wants to be in regards to balance, walking on other surfaces, and distance of walking.                  PT Education - 06/03/19 1305    Education Details  exercise technique, goals, POC    Person(s) Educated  Patient    Methods   Explanation;Demonstration;Tactile cues;Verbal cues    Comprehension  Verbalized understanding;Returned demonstration;Verbal cues required;Tactile cues required       PT Short Term Goals - 06/03/19 1324      PT SHORT TERM GOAL #1   Title  Patient will be independent in home exercise program to improve strength/mobility for better functional independence with ADLs.    Baseline  3/3 HEP next session 4/12 HEP compliant 4/28: compliant; 5/24: HEP compliant    Time  2    Period  Weeks    Status  Partially Met    Target Date  06/17/19      PT SHORT TERM GOAL #2   Title  Patient will ambulate for 6 minutes without seated rest break to increase tolerance for 6 minute walk test    Baseline  3/3: only able to ambulate 3 minutes and 59 seconds prior to rest break 4/12 6 min walk test deferred till next session 4/14: 6 minutes with one rest break 5/24: 10 seconds from full 6 minutes    Time  2    Period  Weeks    Status  Partially Met    Target Date  06/17/19        PT Long Term Goals - 06/03/19 1325      PT LONG TERM GOAL #1   Title  Patient will increase six minute walk test distance to >1000 with LRAD for progression to community ambulator and improve gait ability    Baseline  3/3: 700 ft, terminated early 4/12: deferred due to walking down to PT gym 4/14: 740 ft 6 minutes with one seated rest break 5/24: 800 ft with SPC    Time  8    Period  Weeks    Status  Partially Met    Target Date  07/03/19      PT LONG TERM GOAL #2   Title  Patient will increase FOTO score to equal to or greater than 57/100 to demonstrate statistically significant improved mobility and quality of life.    Baseline  3/3: 47/100 risk adjusted 43/100 4/12: 41/100 4/28: 50/100 5/24: 46%    Time  8    Period  Weeks    Status  Partially Met    Target Date  07/03/19      PT LONG TERM GOAL #3   Title  Patient will increase Berg Balance score by > 6 points (47/56) to demonstrate decreased fall risk during functional  activities.    Baseline  3/3: 41/56 4/12; 49/56    Time  8    Period  Weeks    Status  Achieved      PT LONG TERM GOAL #4   Title  Patient will increase BLE gross strength to 4+/5 as to improve functional strength for independent gait, increased standing tolerance and increased ADL ability.    Baseline  3/3: LLE grossly 4-/5 R grossly 3+ hip 4- knee 4/12: LLE grossly 4- to 4/5 with R 4- hip    Time  8    Period  Weeks    Status  Partially Met    Target Date  07/03/19      PT LONG TERM GOAL #5   Title  Patient will increase dynamic gait index score to >19/24 as to demonstrate reduced fall risk and improved dynamic gait balance for better safety with community/home ambulation.    Baseline  4/28: 14/24 5/24: 17/24    Time  8    Period  Weeks    Status  Partially Met    Target Date  07/03/19            Plan - 06/03/19 1354    Clinical Impression Statement  Patient is progressing with ambulation duration, tolerating 800 ft with SPC during 6 minute walk test prior to back pain causing patient to need to rest. Patient did score lower on FOTO however his outcome measures such as 6 minute walk test and DGI have improved. Patient's condition has the potential to improve in response to therapy. Maximum improvement is yet to be obtained. The anticipated improvement is attainable and reasonable in a generally predictable time.  Patient will benefit from skilled physical therapy to increase stability, strength, and mobility for improved quality of life and independence with ADL and iADLs    Personal Factors and Comorbidities  Age;Comorbidity 3+;Finances;Fitness;Past/Current Experience;Social Background;Time since onset of injury/illness/exacerbation;Transportation    Comorbidities  DM, heart murmur, HLD, HTN, sleep apnea, cellulitis of L foot, hypomagnesemia, unilateral complete BKA R (2019), stage III CKD, PAD, legally blind, hypokalemia, AKI, and sepsis    Examination-Activity Limitations   Bathing;Bed Mobility;Caring for [employer];Locomotion Level;Lift;Stand;Toileting;Transfers    Examination-Participation Restrictions  Church;Cleaning;Community Activity;Driving;Interpersonal Relationship;Laundry;Shop;Meal Prep;Yard Work    Merchant navy officer  Evolving/Moderate complexity    Rehab Potential  Fair    PT Frequency  2x / week    PT Duration  8 weeks    PT Treatment/Interventions  ADLs/Self Care Home Management;Cryotherapy;Electrical Stimulation;Ultrasound;Traction;Moist Heat;DME Instruction;Gait training;Stair training;Functional mobility training;Therapeutic activities;Therapeutic exercise;Patient/family education;Neuromuscular re-education;Balance training;Prosthetic Training;Wheelchair mobility training;Manual techniques;Manual lymph drainage;Compression bandaging;Taping;Energy conservation;Passive range of motion;Aquatic Therapy;Vestibular    PT Next Visit Plan  walk on beach    Consulted and Agree with Plan of Care  Patient       Patient will benefit from skilled therapeutic intervention in order to improve the following deficits and impairments:  Abnormal gait, Decreased activity tolerance, Decreased balance, Decreased knowledge of precautions, Decreased endurance, Decreased knowledge of use of DME, Decreased mobility, Decreased range of motion, Difficulty walking, Decreased strength, Increased edema, Impaired flexibility, Impaired perceived functional ability, Prosthetic Dependency, Postural dysfunction, Improper body mechanics, Pain, Decreased skin integrity, Impaired vision/preception, Impaired sensation  Visit Diagnosis: Other abnormalities of gait and mobility  Muscle weakness (generalized)  Unsteadiness on feet  Unilateral complete BKA, right, sequela ([place])     Problem List Patient Active Problem List   Diagnosis Date Noted  . Elevated lactic acid level   . Diabetic ketoacidosis without coma associated  with type 2 diabetes mellitus ([place])   . Cellulitis of left foot 12/17/2018  .  Pain due to onychomycosis of toenail of right foot 07/05/2018  . Abnormality of gait 10/12/2017  . Type 2 diabetes mellitus with hyperglycemia, with long-term current use of insulin ([place])   . Flatulence   . Hypomagnesemia   . Unilateral complete BKA, right, sequela ([place])   . Benign essential HTN   . Hypoalbuminemia due to protein-calorie malnutrition ([place])   . S/P below knee amputation, right ([place]) 04/12/2017  . Acute blood loss anemia   . Other encephalopathy 04/08/2017  . Encephalopathy 04/08/2017  . Altered mental status 04/08/2017  . Labile blood pressure   . Labile blood glucose   . Drug induced constipation   . Stage 3 chronic kidney disease   . Bacteremia   . S/P unilateral BKA (below knee amputation), right ([place]) 04/04/2017  . PAD (peripheral artery disease) ([place])   . Type 2 diabetes mellitus with right diabetic foot ulcer ([place])   . Post-operative pain   . Legally blind   . Upper GI bleed   . Streptococcal bacteremia 04/01/2017  . Hypokalemia 03/30/2017  . Uncontrolled type 2 diabetes mellitus with hyperglycemia, with long-term current use of insulin ([place]) 03/30/2017  . Type 2 diabetes mellitus with peripheral neuropathy ([place]) 03/30/2017  . AKI (acute kidney injury) ([place]) 03/30/2017  . CKD stage 3 due to type 2 diabetes mellitus ([place]) 03/30/2017  . Sepsis ([place]) 03/30/2017  . Heart murmur 11/22/2016  . Hyperlipidemia associated with type 2 diabetes mellitus ([place]) 11/22/2016  . Obstructive sleep apnea syndrome 11/22/2016  . Essential hypertension 12/17/2012   Janna Arch, PT, DPT   06/03/2019, 1:58 PM  [place] MAIN [facility] SERVICES [address] [place], [place], [address] Phone: [phone]   Fax:  [phone]  Name: BLUFORD SEDLER MRN: [id number] Date of Birth: 1955-11-18

## 2019-06-05 ENCOUNTER — Other Ambulatory Visit: Payer: Self-pay

## 2019-06-05 ENCOUNTER — Ambulatory Visit: Payer: Medicare Other

## 2019-06-05 DIAGNOSIS — R2689 Other abnormalities of gait and mobility: Secondary | ICD-10-CM

## 2019-06-05 DIAGNOSIS — S88111S Complete traumatic amputation at level between knee and ankle, right lower leg, sequela: Secondary | ICD-10-CM

## 2019-06-05 DIAGNOSIS — R2681 Unsteadiness on feet: Secondary | ICD-10-CM

## 2019-06-05 DIAGNOSIS — M6281 Muscle weakness (generalized): Secondary | ICD-10-CM

## 2019-06-05 NOTE — Therapy (Signed)
[place] MAIN [facility] SERVICES [address] [place], [place], [address] Phone: [phone]   Fax:  [phone]  Physical Therapy Treatment  Patient Details  Name: Trevor [name] MRN: [id number] Date of Birth: 31-Aug-1955 Referring Provider (PT): Jamse Arn, MD   Encounter Date: 06/05/2019  PT End of Session - 06/05/19 1320    Visit Number  21    Number of Visits  31    Date for PT Re-Evaluation  07/03/19    Authorization Type  1/10 PN 06/03/19    PT Start Time  9735    PT Stop Time  1345    PT Time Calculation (min)  38 min    Equipment Utilized During Treatment  Gait belt;Other (comment)    Activity Tolerance  Patient limited by fatigue;Patient tolerated treatment well    Behavior During Therapy  [facility] for tasks assessed/performed       Past Medical History:  Diagnosis Date  . Diabetes mellitus without complication ([place])   . Heart murmur   . Hyperlipidemia   . Hypertension   . Sleep apnea     Past Surgical History:  Procedure Laterality Date  . AMPUTATION Right 03/31/2017   Procedure: RIGHT FOOT 1ST AND 2ND RAY AMPUTATION;  Surgeon: Newt Minion, MD;  Location: [place];  Service: Orthopedics;  Laterality: Right;  . AMPUTATION Right 04/01/2017   Procedure: AMPUTATION BELOW KNEE;  Surgeon: Newt Minion, MD;  Location: [place];  Service: Orthopedics;  Laterality: Right;  . COLONOSCOPY WITH PROPOFOL N/A 10/02/2017   Procedure: COLONOSCOPY WITH PROPOFOL;  Surgeon: [name], MD;  Location: [facility] ENDOSCOPY;  Service: Gastroenterology;  Laterality: N/A;  . CORNEAL TRANSPLANT      There were no vitals filed for this visit.  Subjective Assessment - 06/05/19 1318    Subjective  Patient arrived late to PT session due to the heat. very overheated initially requiring water and rest before start of session.    Pertinent History  Patient returning to PT after multiple hospitalizations for L foot and COVID-19. Received home health therapy  till 02/14/19  . Patient last seen 12/13/2018 in this outpatient clinic. PMH includes DM, heart murmur, HLD, HTN, sleep apnea, cellulitis of L foot, hypomagnesemia, unilateral complete BKA R (2019), stage III CKD, PAD, legally blind, hypokalemia, AKI, and sepsis. Uses wheelchair primarily at home rather than walking with prosthesis.    Currently in Pain?  No/denies         Patient arrived late to session limiting session duration  Vitals at start of session: 137/75 pulse 63   Ambulate 96 ft x 5 trials with seated rest break between with seated BTB exercises. Close CGA with SPC ; last trial without SPC   Standing with stair support:    -Stair hamstring stretch 40 seconds each LE; BUE support    -Stair hip flexor stretch 40 seconds each LE; BUE support    -airex pad; weighted ball raise 12x    -airex pad: weighted ball chest press 12x   -airex balance beam: side step 10x each side ; SUE support -airex balance beam tandem stance hold 30 seconds each foot position x 2 trials      seated: cues for body mechanics and sequencing for optimal muscle recruitment   swiss ball forward rollout 10x 10 second holds  Seated BTB abduction 120 each LE  Seated BTB adduction against PT resistance 15x each LE Seated BTB marching 20x    Pt educated  throughout session about proper posture and technique with exercises. Improved exercise technique, movement at target joints, use of target muscles after min to mod verbal, visual, tactile cues.                 PT Education - 06/05/19 1319    Education Details  exercise technique, body mechanics    Person(s) Educated  Patient    Methods  Explanation;Demonstration;Tactile cues;Verbal cues    Comprehension  Verbalized understanding;Returned demonstration;Verbal cues required;Tactile cues required       PT Short Term Goals - 06/03/19 1324      PT SHORT TERM GOAL #1   Title  Patient will be independent in home exercise program to improve  strength/mobility for better functional independence with ADLs.    Baseline  3/3 HEP next session 4/12 HEP compliant 4/28: compliant; 5/24: HEP compliant    Time  2    Period  Weeks    Status  Partially Met    Target Date  06/17/19      PT SHORT TERM GOAL #2   Title  Patient will ambulate for 6 minutes without seated rest break to increase tolerance for 6 minute walk test    Baseline  3/3: only able to ambulate 3 minutes and 59 seconds prior to rest break 4/12 6 min walk test deferred till next session 4/14: 6 minutes with one rest break 5/24: 10 seconds from full 6 minutes    Time  2    Period  Weeks    Status  Partially Met    Target Date  06/17/19        PT Long Term Goals - 06/03/19 1325      PT LONG TERM GOAL #1   Title  Patient will increase six minute walk test distance to >1000 with LRAD for progression to community ambulator and improve gait ability    Baseline  3/3: 700 ft, terminated early 4/12: deferred due to walking down to PT gym 4/14: 740 ft 6 minutes with one seated rest break 5/24: 800 ft with SPC    Time  8    Period  Weeks    Status  Partially Met    Target Date  07/03/19      PT LONG TERM GOAL #2   Title  Patient will increase FOTO score to equal to or greater than 57/100 to demonstrate statistically significant improved mobility and quality of life.    Baseline  3/3: 47/100 risk adjusted 43/100 4/12: 41/100 4/28: 50/100 5/24: 46%    Time  8    Period  Weeks    Status  Partially Met    Target Date  07/03/19      PT LONG TERM GOAL #3   Title  Patient will increase Berg Balance score by > 6 points (47/56) to demonstrate decreased fall risk during functional activities.    Baseline  3/3: 41/56 4/12; 49/56    Time  8    Period  Weeks    Status  Achieved      PT LONG TERM GOAL #4   Title  Patient will increase BLE gross strength to 4+/5 as to improve functional strength for independent gait, increased standing tolerance and increased ADL ability.     Baseline  3/3: LLE grossly 4-/5 R grossly 3+ hip 4- knee 4/12: LLE grossly 4- to 4/5 with R 4- hip    Time  8    Period  Weeks    Status  Partially Met    Target Date  07/03/19      PT LONG TERM GOAL #5   Title  Patient will increase dynamic gait index score to >19/24 as to demonstrate reduced fall risk and improved dynamic gait balance for better safety with community/home ambulation.    Baseline  4/28: 14/24 5/24: 17/24    Time  8    Period  Weeks    Status  Partially Met    Target Date  07/03/19            Plan - 06/05/19 1421    Clinical Impression Statement  Patient's session is shortened due to late arrival and need for water and cooling down prior to start of session. Unstable surfaces continue to challenge patient at this time with improvement noted with repetition and occasional single touch for recentering self. Patient will benefit from skilled physical therapy to increase stability, strength, and mobility for improved quality of life and independence with ADL and iADLs    Personal Factors and Comorbidities  Age;Comorbidity 3+;Finances;Fitness;Past/Current Experience;Social Background;Time since onset of injury/illness/exacerbation;Transportation    Comorbidities  DM, heart murmur, HLD, HTN, sleep apnea, cellulitis of L foot, hypomagnesemia, unilateral complete BKA R (2019), stage III CKD, PAD, legally blind, hypokalemia, AKI, and sepsis    Examination-Activity Limitations  Bathing;Bed Mobility;Caring for [employer];Locomotion Level;Lift;Stand;Toileting;Transfers    Examination-Participation Restrictions  Church;Cleaning;Community Activity;Driving;Interpersonal Relationship;Laundry;Shop;Meal Prep;Yard Work    [employer]chant [employer] officer  Evolving/Moderate complexity    Rehab Potential  Fair    PT Frequency  2x / week    PT Duration  8 weeks    PT Treatment/Interventions  ADLs/Self Care Home Management;Cryotherapy;Electrical  Stimulation;Ultrasound;Traction;Moist Heat;DME Instruction;Gait training;Stair training;Functional mobility training;Therapeutic activities;Therapeutic exercise;Patient/family education;Neuromuscular re-education;Balance training;Prosthetic Training;Wheelchair mobility training;Manual techniques;Manual lymph drainage;Compression bandaging;Taping;Energy conservation;Passive range of motion;Aquatic Therapy;Vestibular    PT Next Visit Plan  walk on beach    Consulted and Agree with Plan of Care  Patient       Patient will benefit from skilled therapeutic intervention in order to improve the following deficits and impairments:  Abnormal gait, Decreased activity tolerance, Decreased balance, Decreased knowledge of precautions, Decreased endurance, Decreased knowledge of use of DME, Decreased mobility, Decreased range of motion, Difficulty walking, Decreased strength, Increased edema, Impaired flexibility, Impaired perceived functional ability, Prosthetic Dependency, Postural dysfunction, Improper body mechanics, Pain, Decreased skin integrity, Impaired vision/preception, Impaired sensation  Visit Diagnosis: Other abnormalities of gait and mobility  Muscle weakness (generalized)  Unsteadiness on feet  Unilateral complete BKA, right, sequela ([place])     Problem List Patient Active Problem List   Diagnosis Date Noted  . Elevated lactic acid level   . Diabetic ketoacidosis without coma associated with type 2 diabetes mellitus ([place])   . Cellulitis of left foot 12/17/2018  . Pain due to onychomycosis of toenail of right foot 07/05/2018  . Abnormality of gait 10/12/2017  . Type 2 diabetes mellitus with hyperglycemia, with long-term current use of insulin ([place])   . Flatulence   . Hypomagnesemia   . Unilateral complete BKA, right, sequela ([place])   . Benign essential HTN   . Hypoalbuminemia due to protein-calorie malnutrition ([place])   . S/P below knee amputation, right ([place]) 04/12/2017  . Acute blood  loss anemia   . Other encephalopathy 04/08/2017  . Encephalopathy 04/08/2017  . Altered mental status 04/08/2017  . Labile blood pressure   . Labile blood glucose   . Drug induced constipation   . Stage 3 chronic kidney disease   .  Bacteremia   . S/P unilateral BKA (below knee amputation), right ([place]) 04/04/2017  . PAD (peripheral artery disease) ([place])   . Type 2 diabetes mellitus with right diabetic foot ulcer ([place])   . Post-operative pain   . Legally blind   . Upper GI bleed   . Streptococcal bacteremia 04/01/2017  . Hypokalemia 03/30/2017  . Uncontrolled type 2 diabetes mellitus with hyperglycemia, with long-term current use of insulin ([place]) 03/30/2017  . Type 2 diabetes mellitus with peripheral neuropathy ([place]) 03/30/2017  . AKI (acute kidney injury) ([place]) 03/30/2017  . CKD stage 3 due to type 2 diabetes mellitus ([place]) 03/30/2017  . Sepsis ([place]) 03/30/2017  . Heart murmur 11/22/2016  . Hyperlipidemia associated with type 2 diabetes mellitus ([place]) 11/22/2016  . Obstructive sleep apnea syndrome 11/22/2016  . Essential hypertension 12/17/2012   Janna Arch, PT, DPT   06/05/2019, 2:23 PM  [place] MAIN [facility] SERVICES [address] [place], [place], [address] Phone: [phone]   Fax:  [phone]  Name: TOBIN WITUCKI MRN: [id number] Date of Birth: 29-Jun-1955

## 2019-06-12 ENCOUNTER — Ambulatory Visit: Payer: Medicare Other | Attending: Physical Medicine & Rehabilitation

## 2019-06-12 ENCOUNTER — Other Ambulatory Visit: Payer: Self-pay

## 2019-06-12 DIAGNOSIS — S88111S Complete traumatic amputation at level between knee and ankle, right lower leg, sequela: Secondary | ICD-10-CM | POA: Insufficient documentation

## 2019-06-12 DIAGNOSIS — M6281 Muscle weakness (generalized): Secondary | ICD-10-CM | POA: Diagnosis present

## 2019-06-12 DIAGNOSIS — R2681 Unsteadiness on feet: Secondary | ICD-10-CM | POA: Diagnosis present

## 2019-06-12 DIAGNOSIS — R2689 Other abnormalities of gait and mobility: Secondary | ICD-10-CM | POA: Diagnosis present

## 2019-06-12 NOTE — Therapy (Signed)
[place] MAIN [facility] SERVICES [address] [place], [place], [address] Phone: [phone]   Fax:  [phone]  Physical Therapy Treatment  Patient Details  Name: Trevor [name] MRN: [id number] Date of Birth: 12/31/1955 Referring Provider (PT): Jamse Arn, MD   Encounter Date: 06/12/2019  PT End of Session - 06/12/19 1332    Visit Number  22    Number of Visits  31    Date for PT Re-Evaluation  07/03/19    Authorization Type  2/10 PN 06/03/19    PT Start Time  1301    PT Stop Time  1344    PT Time Calculation (min)  43 min    Equipment Utilized During Treatment  Gait belt;Other (comment)    Activity Tolerance  Patient limited by fatigue;Patient tolerated treatment well    Behavior During Therapy  [facility] for tasks assessed/performed       Past Medical History:  Diagnosis Date  . Diabetes mellitus without complication ([place])   . Heart murmur   . Hyperlipidemia   . Hypertension   . Sleep apnea     Past Surgical History:  Procedure Laterality Date  . AMPUTATION Right 03/31/2017   Procedure: RIGHT FOOT 1ST AND 2ND RAY AMPUTATION;  Surgeon: Newt Minion, MD;  Location: [place];  Service: Orthopedics;  Laterality: Right;  . AMPUTATION Right 04/01/2017   Procedure: AMPUTATION BELOW KNEE;  Surgeon: Newt Minion, MD;  Location: [place];  Service: Orthopedics;  Laterality: Right;  . COLONOSCOPY WITH PROPOFOL N/A 10/02/2017   Procedure: COLONOSCOPY WITH PROPOFOL;  Surgeon: [name], MD;  Location: [facility] ENDOSCOPY;  Service: Gastroenterology;  Laterality: N/A;  . CORNEAL TRANSPLANT      There were no vitals filed for this visit.  Subjective Assessment - 06/12/19 1307    Subjective  Patient reports no falls or LOB since last session, had a good weekend. [name] is with him today.    Pertinent History  Patient returning to PT after multiple hospitalizations for L foot and COVID-19. Received home health therapy till 02/14/19  . Patient last  seen 12/13/2018 in this outpatient clinic. PMH includes DM, heart murmur, HLD, HTN, sleep apnea, cellulitis of L foot, hypomagnesemia, unilateral complete BKA R (2019), stage III CKD, PAD, legally blind, hypokalemia, AKI, and sepsis. Uses wheelchair primarily at home rather than walking with prosthesis.    Currently in Pain?  No/denies             Vitals at start of session: 129/68 pulse 69   Nustep Lvl 4 RPM> 60 4 minutes for cardiovascular challenge  mbulate >1000 ft with and without SPC ; cues for upright posture; close CGA; cues for negotiating obstacles, people, and turns  Negotiate unsteady surface, ambulate with SPC across red matt 6x with one seated rest break    Standing with stair support:   -Stair hamstring stretch 40 seconds each LE; BUE support    -Stair hip flexor stretch 40 seconds each LE; BUE support    -airex pad; weighted ball eyes closed 30 seconds    -airex WCB:[id number] no UE support 10x    -airex balance beam: side step 10x each side ; SUE support -airex balance beam tandem stance hold 30 seconds each foot position x 2 trials       seated: cues for body mechanics and sequencing for optimal muscle recruitment   swiss ball forward rollout 10x 10 second holds  Seated BTB abduction 120 each LE  Seated BTB adduction against PT resistance 15x each LE Seated BTB marching 20x    Pt educated throughout session about proper posture and technique with exercises. Improved exercise technique, movement at target joints, use of target muscles after min to mod verbal, visual, tactile cues.                      PT Education - 06/12/19 1330    Education Details  exercise technique, body mechanics    Person(s) Educated  Patient    Methods  Explanation;Demonstration;Tactile cues;Verbal cues    Comprehension  Verbalized understanding;Returned demonstration;Verbal cues required;Tactile cues required       PT Short Term Goals - 06/03/19 1324      PT  SHORT TERM GOAL #1   Title  Patient will be independent in home exercise program to improve strength/mobility for better functional independence with ADLs.    Baseline  3/3 HEP next session 4/12 HEP compliant 4/28: compliant; 5/24: HEP compliant    Time  2    Period  Weeks    Status  Partially Met    Target Date  06/17/19      PT SHORT TERM GOAL #2   Title  Patient will ambulate for 6 minutes without seated rest break to increase tolerance for 6 minute walk test    Baseline  3/3: only Trevor to ambulate 3 minutes and 59 seconds prior to rest break 4/12 6 min walk test deferred till next session 4/14: 6 minutes with one rest break 5/24: 10 seconds from full 6 minutes    Time  2    Period  Weeks    Status  Partially Met    Target Date  06/17/19        PT Long Term Goals - 06/03/19 1325      PT LONG TERM GOAL #1   Title  Patient will increase six minute walk test distance to >1000 with LRAD for progression to community ambulator and improve gait ability    Baseline  3/3: 700 ft, terminated early 4/12: deferred due to walking down to PT gym 4/14: 740 ft 6 minutes with one seated rest break 5/24: 800 ft with SPC    Time  8    Period  Weeks    Status  Partially Met    Target Date  07/03/19      PT LONG TERM GOAL #2   Title  Patient will increase FOTO score to equal to or greater than 57/100 to demonstrate statistically significant improved mobility and quality of life.    Baseline  3/3: 47/100 risk adjusted 43/100 4/12: 41/100 4/28: 50/100 5/24: 46%    Time  8    Period  Weeks    Status  Partially Met    Target Date  07/03/19      PT LONG TERM GOAL #3   Title  Patient will increase Berg Balance score by > 6 points (47/56) to demonstrate decreased fall risk during functional activities.    Baseline  3/3: 41/56 4/12; 49/56    Time  8    Period  Weeks    Status  Achieved      PT LONG TERM GOAL #4   Title  Patient will increase BLE gross strength to 4+/5 as to improve functional  strength for independent gait, increased standing tolerance and increased ADL ability.    Baseline  3/3: LLE grossly 4-/5 R grossly 3+ hip 4- knee 4/12: LLE grossly  4- to 4/5 with R 4- hip    Time  8    Period  Weeks    Status  Partially Met    Target Date  07/03/19      PT LONG TERM GOAL #5   Title  Patient will increase dynamic gait index score to >19/24 as to demonstrate reduced fall risk and improved dynamic gait balance for better safety with community/home ambulation.    Baseline  4/28: 14/24 5/24: 17/24    Time  8    Period  Weeks    Status  Partially Met    Target Date  07/03/19            Plan - 06/12/19 1334    Clinical Impression Statement  Patient continues to progress with functional ambulation, negotiating unstable surfaces for first time without loss of balance, Increased independence from AD with prolonged walking performed with ~ 50% of >1000 ft performed without AD. Patient will benefit from skilled physical therapy to increase stability, strength, and mobility for improved quality of life and independence with ADL and iADLs    Personal Factors and Comorbidities  Age;Comorbidity 3+;Finances;Fitness;Past/Current Experience;Social Background;Time since onset of injury/illness/exacerbation;Transportation    Comorbidities  DM, heart murmur, HLD, HTN, sleep apnea, cellulitis of L foot, hypomagnesemia, unilateral complete BKA R (2019), stage III CKD, PAD, legally blind, hypokalemia, AKI, and sepsis    Examination-Activity Limitations  Bathing;Bed Mobility;Caring for [employer];Locomotion Level;Lift;Stand;Toileting;Transfers    Examination-Participation Restrictions  Church;Cleaning;Community Activity;Driving;Interpersonal Relationship;Laundry;Shop;Meal Prep;Yard Work    [employer]chant [employer] officer  Evolving/Moderate complexity    Rehab Potential  Fair    PT Frequency  2x / week    PT Duration  8 weeks    PT  Treatment/Interventions  ADLs/Self Care Home Management;Cryotherapy;Electrical Stimulation;Ultrasound;Traction;Moist Heat;DME Instruction;Gait training;Stair training;Functional mobility training;Therapeutic activities;Therapeutic exercise;Patient/family education;Neuromuscular re-education;Balance training;Prosthetic Training;Wheelchair mobility training;Manual techniques;Manual lymph drainage;Compression bandaging;Taping;Energy conservation;Passive range of motion;Aquatic Therapy;Vestibular    PT Next Visit Plan  walk on beach    Consulted and Agree with Plan of Care  Patient       Patient will benefit from skilled therapeutic intervention in order to improve the following deficits and impairments:  Abnormal gait, Decreased activity tolerance, Decreased balance, Decreased knowledge of precautions, Decreased endurance, Decreased knowledge of use of DME, Decreased mobility, Decreased range of motion, Difficulty walking, Decreased strength, Increased edema, Impaired flexibility, Impaired perceived functional ability, Prosthetic Dependency, Postural dysfunction, Improper body mechanics, Pain, Decreased skin integrity, Impaired vision/preception, Impaired sensation  Visit Diagnosis: Other abnormalities of gait and mobility  Muscle weakness (generalized)  Unsteadiness on feet  Unilateral complete BKA, right, sequela ([place])     Problem List Patient Active Problem List   Diagnosis Date Noted  . Elevated lactic acid level   . Diabetic ketoacidosis without coma associated with type 2 diabetes mellitus ([place])   . Cellulitis of left foot 12/17/2018  . Pain due to onychomycosis of toenail of right foot 07/05/2018  . Abnormality of gait 10/12/2017  . Type 2 diabetes mellitus with hyperglycemia, with long-term current use of insulin ([place])   . Flatulence   . Hypomagnesemia   . Unilateral complete BKA, right, sequela ([place])   . Benign essential HTN   . Hypoalbuminemia due to protein-calorie  malnutrition ([place])   . S/P below knee amputation, right ([place]) 04/12/2017  . Acute blood loss anemia   . Other encephalopathy 04/08/2017  . Encephalopathy 04/08/2017  . Altered mental status 04/08/2017  . Labile blood pressure   . Labile blood  glucose   . Drug induced constipation   . Stage 3 chronic kidney disease   . Bacteremia   . S/P unilateral BKA (below knee amputation), right ([place]) 04/04/2017  . PAD (peripheral artery disease) ([place])   . Type 2 diabetes mellitus with right diabetic foot ulcer ([place])   . Post-operative pain   . Legally blind   . Upper GI bleed   . Streptococcal bacteremia 04/01/2017  . Hypokalemia 03/30/2017  . Uncontrolled type 2 diabetes mellitus with hyperglycemia, with long-term current use of insulin ([place]) 03/30/2017  . Type 2 diabetes mellitus with peripheral neuropathy ([place]) 03/30/2017  . AKI (acute kidney injury) ([place]) 03/30/2017  . CKD stage 3 due to type 2 diabetes mellitus ([place]) 03/30/2017  . Sepsis ([place]) 03/30/2017  . Heart murmur 11/22/2016  . Hyperlipidemia associated with type 2 diabetes mellitus ([place]) 11/22/2016  . Obstructive sleep apnea syndrome 11/22/2016  . Essential hypertension 12/17/2012   Janna Arch, PT, DPT   06/12/2019, 1:44 PM  [place] MAIN [facility] SERVICES [address] [place], [place], [address] Phone: [phone]   Fax:  [phone]  Name: Trevor [name] MRN: [id number] Date of Birth: 01-Oct-1955

## 2019-06-17 ENCOUNTER — Ambulatory Visit: Payer: Medicare Other

## 2019-06-17 ENCOUNTER — Other Ambulatory Visit: Payer: Self-pay

## 2019-06-17 DIAGNOSIS — M6281 Muscle weakness (generalized): Secondary | ICD-10-CM

## 2019-06-17 DIAGNOSIS — R2681 Unsteadiness on feet: Secondary | ICD-10-CM

## 2019-06-17 DIAGNOSIS — R2689 Other abnormalities of gait and mobility: Secondary | ICD-10-CM | POA: Diagnosis not present

## 2019-06-18 NOTE — Therapy (Signed)
[place] MAIN [facility] SERVICES [address] [place], [place], [address] Phone: [phone]   Fax:  [phone]  Physical Therapy Treatment  Patient Details  Name: Trevor [name] MRN: [id number] Date of Birth: Jan 18, 1955 Referring Provider (PT): Jamse Arn, MD   Encounter Date: 06/17/2019  PT End of Session - 06/18/19 0719    Visit Number  23    Number of Visits  31    Date for PT Re-Evaluation  07/03/19    Authorization Type  2/10 PN 06/03/19    PT Start Time  1309    PT Stop Time  1340    PT Time Calculation (min)  31 min    Equipment Utilized During Treatment  Gait belt;Other (comment)    Activity Tolerance  Patient limited by fatigue;Patient tolerated treatment well    Behavior During Therapy  [facility] for tasks assessed/performed       Past Medical History:  Diagnosis Date  . Diabetes mellitus without complication ([place])   . Heart murmur   . Hyperlipidemia   . Hypertension   . Sleep apnea     Past Surgical History:  Procedure Laterality Date  . AMPUTATION Right 03/31/2017   Procedure: RIGHT FOOT 1ST AND 2ND RAY AMPUTATION;  Surgeon: Newt Minion, MD;  Location: [place];  Service: Orthopedics;  Laterality: Right;  . AMPUTATION Right 04/01/2017   Procedure: AMPUTATION BELOW KNEE;  Surgeon: Newt Minion, MD;  Location: [place];  Service: Orthopedics;  Laterality: Right;  . COLONOSCOPY WITH PROPOFOL N/A 10/02/2017   Procedure: COLONOSCOPY WITH PROPOFOL;  Surgeon: [name], MD;  Location: [facility] ENDOSCOPY;  Service: Gastroenterology;  Laterality: N/A;  . CORNEAL TRANSPLANT      There were no vitals filed for this visit.  Subjective Assessment - 06/18/19 0717    Subjective  Pt reports no falls or LOB since last visit.  He states that he has to leave early today due to another commitment.  He arrived and was checked in at 1:07 for appointment today.    Pertinent History  Patient returning to PT after multiple hospitalizations for L  foot and COVID-19. Received home health therapy till 02/14/19  . Patient last seen 12/13/2018 in this outpatient clinic. PMH includes DM, heart murmur, HLD, HTN, sleep apnea, cellulitis of L foot, hypomagnesemia, unilateral complete BKA R (2019), stage III CKD, PAD, legally blind, hypokalemia, AKI, and sepsis. Uses wheelchair primarily at home rather than walking with prosthesis.    Limitations  Standing;Walking;House hold activities;Other (comment);Lifting    How long can you sit comfortably?  hours /na/    How long can you stand comfortably?  15 minutes with UE support    How long can you walk comfortably?  walked in the house    Patient Stated Goals  walk better.    Currently in Pain?  No/denies          Nustep Lvl 4 RPM> 60 4 minutes for cardiovascular challenge  Ambulate >1000 ft with SPC  ; cues for upright posture; close CGA; cues for negotiating obstacles, people, and turns   Standing with stair support: -Stair hamstring stretch 40 seconds each LE; BUE support  -Stair hip flexor stretch 40 seconds each LE; BUE support  -airex pad;weighted ball eyes closed 30 seconds    seated: cues for body mechanics and sequencing for optimal muscle recruitment  LAQs 2# B 2x10; HS curls with BTB 2x10; seated marching 2x10  Pt educated throughout session about  proper posture and technique with exercises. Improved exercise technique, movement at target joints, use of target muscles after min to mod verbal, visual, tactile cues.                         PT Education - 06/18/19 0719    Education Details  exercise technique; body mechanics    Person(s) Educated  Patient    Methods  Explanation;Demonstration;Tactile cues;Verbal cues    Comprehension  Verbalized understanding;Returned demonstration;Verbal cues required;Tactile cues required       PT Short Term Goals - 06/03/19 1324      PT SHORT TERM GOAL #1   Title  Patient will be independent in home  exercise program to improve strength/mobility for better functional independence with ADLs.    Baseline  3/3 HEP next session 4/12 HEP compliant 4/28: compliant; 5/24: HEP compliant    Time  2    Period  Weeks    Status  Partially Met    Target Date  06/17/19      PT SHORT TERM GOAL #2   Title  Patient will ambulate for 6 minutes without seated rest break to increase tolerance for 6 minute walk test    Baseline  3/3: only able to ambulate 3 minutes and 59 seconds prior to rest break 4/12 6 min walk test deferred till next session 4/14: 6 minutes with one rest break 5/24: 10 seconds from full 6 minutes    Time  2    Period  Weeks    Status  Partially Met    Target Date  06/17/19        PT Long Term Goals - 06/03/19 1325      PT LONG TERM GOAL #1   Title  Patient will increase six minute walk test distance to >1000 with LRAD for progression to community ambulator and improve gait ability    Baseline  3/3: 700 ft, terminated early 4/12: deferred due to walking down to PT gym 4/14: 740 ft 6 minutes with one seated rest break 5/24: 800 ft with SPC    Time  8    Period  Weeks    Status  Partially Met    Target Date  07/03/19      PT LONG TERM GOAL #2   Title  Patient will increase FOTO score to equal to or greater than 57/100 to demonstrate statistically significant improved mobility and quality of life.    Baseline  3/3: 47/100 risk adjusted 43/100 4/12: 41/100 4/28: 50/100 5/24: 46%    Time  8    Period  Weeks    Status  Partially Met    Target Date  07/03/19      PT LONG TERM GOAL #3   Title  Patient will increase Berg Balance score by > 6 points (47/56) to demonstrate decreased fall risk during functional activities.    Baseline  3/3: 41/56 4/12; 49/56    Time  8    Period  Weeks    Status  Achieved      PT LONG TERM GOAL #4   Title  Patient will increase BLE gross strength to 4+/5 as to improve functional strength for independent gait, increased standing tolerance and  increased ADL ability.    Baseline  3/3: LLE grossly 4-/5 R grossly 3+ hip 4- knee 4/12: LLE grossly 4- to 4/5 with R 4- hip    Time  8    Period  Weeks    Status  Partially Met    Target Date  07/03/19      PT LONG TERM GOAL #5   Title  Patient will increase dynamic gait index score to >19/24 as to demonstrate reduced fall risk and improved dynamic gait balance for better safety with community/home ambulation.    Baseline  4/28: 14/24 5/24: 17/24    Time  8    Period  Weeks    Status  Partially Met    Target Date  07/03/19            Plan - 06/18/19 0720    Clinical Impression Statement  Pt tolerated all ther ex's without problems today, but did state during standing WB'ing stretches that he wanted to sit down to do remaining therapy due to standing "hurting (my) knee."    Personal Factors and Comorbidities  Age;Comorbidity 3+;Finances;Fitness;Past/Current Experience;Social Background;Time since onset of injury/illness/exacerbation;Transportation    Comorbidities  DM, heart murmur, HLD, HTN, sleep apnea, cellulitis of L foot, hypomagnesemia, unilateral complete BKA R (2019), stage III CKD, PAD, legally blind, hypokalemia, AKI, and sepsis    Examination-Activity Limitations  Bathing;Bed Mobility;Caring for [employer];Locomotion Level;Lift;Stand;Toileting;Transfers    Stability/Clinical Decision Making  Evolving/Moderate complexity    Clinical Decision Making  Moderate    Rehab Potential  Fair    Clinical Impairments Affecting Rehab Potential  (+) previous independence, motivation to return to walking (-) lives alone, hx of diabetes, limited vision     PT Frequency  2x / week    PT Duration  8 weeks    PT Treatment/Interventions  ADLs/Self Care Home Management;Cryotherapy;Electrical Stimulation;Ultrasound;Traction;Moist Heat;DME Instruction;Gait training;Stair training;Functional mobility training;Therapeutic activities;Therapeutic  exercise;Patient/family education;Neuromuscular re-education;Balance training;Prosthetic Training;Wheelchair mobility training;Manual techniques;Manual lymph drainage;Compression bandaging;Taping;Energy conservation;Passive range of motion;Aquatic Therapy;Vestibular    PT Next Visit Plan  walk on beach    PT Home Exercise Plan  see [id number] on medbridge, spc ambulation    Consulted and Agree with Plan of Care  Patient       Patient will benefit from skilled therapeutic intervention in order to improve the following deficits and impairments:  Abnormal gait, Decreased activity tolerance, Decreased balance, Decreased knowledge of precautions, Decreased endurance, Decreased knowledge of use of DME, Decreased mobility, Decreased range of motion, Difficulty walking, Decreased strength, Increased edema, Impaired flexibility, Impaired perceived functional ability, Prosthetic Dependency, Postural dysfunction, Improper body mechanics, Pain, Decreased skin integrity, Impaired vision/preception, Impaired sensation  Visit Diagnosis: Muscle weakness (generalized)  Unsteadiness on feet  Other abnormalities of gait and mobility     Problem List Patient Active Problem List   Diagnosis Date Noted  . Elevated lactic acid level   . Diabetic ketoacidosis without coma associated with type 2 diabetes mellitus ([place])   . Cellulitis of left foot 12/17/2018  . Pain due to onychomycosis of toenail of right foot 07/05/2018  . Abnormality of gait 10/12/2017  . Type 2 diabetes mellitus with hyperglycemia, with long-term current use of insulin ([name])   . Flatulence   . Hypomagnesemia   . Unilateral complete BKA, right, sequela ([place])   . Benign essential HTN   . Hypoalbuminemia due to protein-calorie malnutrition ([place])   . S/P below knee amputation, right ([place]) 04/12/2017  . Acute blood loss anemia   . Other encephalopathy 04/08/2017  . Encephalopathy 04/08/2017  . Altered mental status 04/08/2017  . Labile  blood pressure   . Labile blood glucose   . Drug induced constipation   . Stage 3 chronic kidney disease   .  Bacteremia   . S/P unilateral BKA (below knee amputation), right ([place]) 04/04/2017  . PAD (peripheral artery disease) ([place])   . Type 2 diabetes mellitus with right diabetic foot ulcer ([place])   . Post-operative pain   . Legally blind   . Upper GI bleed   . Streptococcal bacteremia 04/01/2017  . Hypokalemia 03/30/2017  . Uncontrolled type 2 diabetes mellitus with hyperglycemia, with long-term current use of insulin ([place]) 03/30/2017  . Type 2 diabetes mellitus with peripheral neuropathy ([place]) 03/30/2017  . AKI (acute kidney injury) ([place]) 03/30/2017  . CKD stage 3 due to type 2 diabetes mellitus ([place]) 03/30/2017  . Sepsis ([place]) 03/30/2017  . Heart murmur 11/22/2016  . Hyperlipidemia associated with type 2 diabetes mellitus ([place]) 11/22/2016  . Obstructive sleep apnea syndrome 11/22/2016  . Essential hypertension 12/17/2012    Reyann Troop, MPT 06/18/2019, 7:26 AM  [place] MAIN [facility] SERVICES [address] [place], [place], [address] Phone: [phone]   Fax:  [phone]  Name: Trevor [name] MRN: [id number] Date of Birth: 01-10-1956

## 2019-06-19 ENCOUNTER — Other Ambulatory Visit: Payer: Self-pay

## 2019-06-19 ENCOUNTER — Ambulatory Visit: Payer: Medicare Other

## 2019-06-19 DIAGNOSIS — R2689 Other abnormalities of gait and mobility: Secondary | ICD-10-CM | POA: Diagnosis not present

## 2019-06-19 DIAGNOSIS — S88111S Complete traumatic amputation at level between knee and ankle, right lower leg, sequela: Secondary | ICD-10-CM

## 2019-06-19 DIAGNOSIS — R2681 Unsteadiness on feet: Secondary | ICD-10-CM

## 2019-06-19 DIAGNOSIS — M6281 Muscle weakness (generalized): Secondary | ICD-10-CM

## 2019-06-19 NOTE — Therapy (Signed)
[place] MAIN [facility] SERVICES [address] [place], [place], [address] Phone: [phone]   Fax:  [phone]  Physical Therapy Treatment  Patient Details  Name: Trevor [name] MRN: [id number] Date of Birth: [birth year] Referring Provider (PT): Jamse Arn, MD   Encounter Date: 06/19/2019  PT End of Session - 06/19/19 1324    Visit Number  24    Number of Visits  31    Date for PT Re-Evaluation  07/03/19    Authorization Type  4/10 PN 06/03/19    PT Start Time  1306    PT Stop Time  1344    PT Time Calculation (min)  38 min    Equipment Utilized During Treatment  Gait belt;Other (comment)    Activity Tolerance  Patient limited by fatigue;Patient tolerated treatment well    Behavior During Therapy  [facility] for tasks assessed/performed       Past Medical History:  Diagnosis Date   Diabetes mellitus without complication ([place])    Heart murmur    Hyperlipidemia    Hypertension    Sleep apnea     Past Surgical History:  Procedure Laterality Date   AMPUTATION Right 03/31/2017   Procedure: RIGHT FOOT 1ST AND 2ND RAY AMPUTATION;  Surgeon: Newt Minion, MD;  Location: [place];  Service: Orthopedics;  Laterality: Right;   AMPUTATION Right 04/01/2017   Procedure: AMPUTATION BELOW KNEE;  Surgeon: Newt Minion, MD;  Location: [place];  Service: Orthopedics;  Laterality: Right;   COLONOSCOPY WITH PROPOFOL N/A 10/02/2017   Procedure: COLONOSCOPY WITH PROPOFOL;  Surgeon: [name], MD;  Location: [facility] ENDOSCOPY;  Service: Gastroenterology;  Laterality: N/A;   CORNEAL TRANSPLANT      There were no vitals filed for this visit.  Subjective Assessment - 06/19/19 1310    Subjective  Patient reports he had his last dermatology wound apointment monday. Forgot his cane in his car today.    Pertinent History  Patient returning to PT after multiple hospitalizations for L foot and COVID-19. Received home health therapy till 02/14/19  . Patient  last seen 12/13/2018 in this outpatient clinic. PMH includes DM, heart murmur, HLD, HTN, sleep apnea, cellulitis of L foot, hypomagnesemia, unilateral complete BKA R (2019), stage III CKD, PAD, legally blind, hypokalemia, AKI, and sepsis. Uses wheelchair primarily at home rather than walking with prosthesis.    Limitations  Standing;Walking;House hold activities;Other (comment);Lifting    How long can you sit comfortably?  hours /na/    How long can you stand comfortably?  15 minutes with UE support    How long can you walk comfortably?  walked in the house    Patient Stated Goals  walk better.    Currently in Pain?  No/denies             Vitals at start of session: 139/72 pulse 63     Nustep Lvl 4 RPM> 60 4 minutes for cardiovascular challenge   Ambulate 200 ft around circumference of therapy gym (2 laps total) no UE support  ; cues for upright posture; close CGA; cues for negotiating obstacles, people, and turns. x4 sets       Standing with stair support:   -Stair hamstring stretch 40 seconds each LE; BUE support    -Stair hip flexor stretch 40 seconds each LE; BUE support    -airex pad; weighted ball eyes closed 30 seconds    -airex pad: lateral 6" step toe tap 15x each LE   -  marching 15 each LE, decreasing from SUE support to no UE support     5lb ankle weight:  Hip abduction 15x each LE Hip extension focus on gluteal activation 15x each LE   seated: cues for body mechanics and sequencing for optimal muscle recruitment  5lb ankle weight: -LAQ 15x each LE, focus on slow steady activation.    swiss ball forward rollout 10x 10 second holds     Pt educated throughout session about proper posture and technique with exercises. Improved exercise technique, movement at target joints, use of target muscles after min to mod verbal, visual, tactile cues.                     PT Education - 06/19/19 1311    Education Details  exercise technique, body  mechanics    Person(s) Educated  Patient    Methods  Explanation;Demonstration;Tactile cues;Verbal cues    Comprehension  Verbalized understanding;Returned demonstration;Verbal cues required;Tactile cues required       PT Short Term Goals - 06/03/19 1324      PT SHORT TERM GOAL #1   Title  Patient will be independent in home exercise program to improve strength/mobility for better functional independence with ADLs.    Baseline  3/3 HEP next session 4/12 HEP compliant 4/28: compliant; 5/24: HEP compliant    Time  2    Period  Weeks    Status  Partially Met    Target Date  06/17/19      PT SHORT TERM GOAL #2   Title  Patient will ambulate for 6 minutes without seated rest break to increase tolerance for 6 minute walk test    Baseline  3/3: only able to ambulate 3 minutes and 59 seconds prior to rest break 4/12 6 min walk test deferred till next session 4/14: 6 minutes with one rest break 5/24: 10 seconds from full 6 minutes    Time  2    Period  Weeks    Status  Partially Met    Target Date  06/17/19        PT Long Term Goals - 06/03/19 1325      PT LONG TERM GOAL #1   Title  Patient will increase six minute walk test distance to >1000 with LRAD for progression to community ambulator and improve gait ability    Baseline  3/3: 700 ft, terminated early 4/12: deferred due to walking down to PT gym 4/14: 740 ft 6 minutes with one seated rest break 5/24: 800 ft with SPC    Time  8    Period  Weeks    Status  Partially Met    Target Date  07/03/19      PT LONG TERM GOAL #2   Title  Patient will increase FOTO score to equal to or greater than 57/100 to demonstrate statistically significant improved mobility and quality of life.    Baseline  3/3: 47/100 risk adjusted 43/100 4/12: 41/100 4/28: 50/100 5/24: 46%    Time  8    Period  Weeks    Status  Partially Met    Target Date  07/03/19      PT LONG TERM GOAL #3   Title  Patient will increase Berg Balance score by > 6 points  (47/56) to demonstrate decreased fall risk during functional activities.    Baseline  3/3: 41/56 4/12; 49/56    Time  8    Period  Weeks  Status  Achieved      PT LONG TERM GOAL #4   Title  Patient will increase BLE gross strength to 4+/5 as to improve functional strength for independent gait, increased standing tolerance and increased ADL ability.    Baseline  3/3: LLE grossly 4-/5 R grossly 3+ hip 4- knee 4/12: LLE grossly 4- to 4/5 with R 4- hip    Time  8    Period  Weeks    Status  Partially Met    Target Date  07/03/19      PT LONG TERM GOAL #5   Title  Patient will increase dynamic gait index score to >19/24 as to demonstrate reduced fall risk and improved dynamic gait balance for better safety with community/home ambulation.    Baseline  4/28: 14/24 5/24: 17/24    Time  8    Period  Weeks    Status  Partially Met    Target Date  07/03/19            Plan - 06/19/19 1458    Clinical Impression Statement  Patient is progressing with decreased need for UE support with dynamic stability and mobility. Unstable surfaces continue to be an area of challenge for patient but improves with postural correction. Back pain does limit prolonged mobility without UE support.  Patient will benefit from skilled physical therapy to increase stability, strength, and mobility for improved quality of life and independence with ADL and iADLs    Personal Factors and Comorbidities  Age;Comorbidity 3+;Finances;Fitness;Past/Current Experience;Social Background;Time since onset of injury/illness/exacerbation;Transportation    Comorbidities  DM, heart murmur, HLD, HTN, sleep apnea, cellulitis of L foot, hypomagnesemia, unilateral complete BKA R (2019), stage III CKD, PAD, legally blind, hypokalemia, AKI, and sepsis    Examination-Activity Limitations  Bathing;Bed Mobility;Caring for [employer];Locomotion Level;Lift;Stand;Toileting;Transfers     Examination-Participation Restrictions  Church;Cleaning;Community Activity;Driving;Interpersonal Relationship;Laundry;Shop;Meal Prep;Yard Work    [employer] officer  Evolving/Moderate complexity    Rehab Potential  Fair    PT Frequency  2x / week    PT Duration  8 weeks    PT Treatment/Interventions  ADLs/Self Care Home Management;Cryotherapy;Electrical Stimulation;Ultrasound;Traction;Moist Heat;DME Instruction;Gait training;Stair training;Functional mobility training;Therapeutic activities;Therapeutic exercise;Patient/family education;Neuromuscular re-education;Balance training;Prosthetic Training;Wheelchair mobility training;Manual techniques;Manual lymph drainage;Compression bandaging;Taping;Energy conservation;Passive range of motion;Aquatic Therapy;Vestibular    PT Next Visit Plan  walk on beach    Consulted and Agree with Plan of Care  Patient       Patient will benefit from skilled therapeutic intervention in order to improve the following deficits and impairments:  Abnormal gait, Decreased activity tolerance, Decreased balance, Decreased knowledge of precautions, Decreased endurance, Decreased knowledge of use of DME, Decreased mobility, Decreased range of motion, Difficulty walking, Decreased strength, Increased edema, Impaired flexibility, Impaired perceived functional ability, Prosthetic Dependency, Postural dysfunction, Improper body mechanics, Pain, Decreased skin integrity, Impaired vision/preception, Impaired sensation  Visit Diagnosis: Muscle weakness (generalized)  Unsteadiness on feet  Other abnormalities of gait and mobility  Unilateral complete BKA, right, sequela ([facility])     Problem List Patient Active Problem List   Diagnosis Date Noted   Elevated lactic acid level    Diabetic ketoacidosis without coma associated with type 2 diabetes mellitus ([place])    Cellulitis of left foot 12/17/2018   Pain due to onychomycosis of toenail of right foot  07/05/2018   Abnormality of gait 10/12/2017   Type 2 diabetes mellitus with hyperglycemia, with long-term current use of insulin ([facility])    Flatulence    Hypomagnesemia  Unilateral complete BKA, right, sequela ([facility])    Benign essential HTN    Hypoalbuminemia due to protein-calorie malnutrition ([place])    S/P below knee amputation, right ([place]) 04/12/2017   Acute blood loss anemia    Other encephalopathy 04/08/2017   Encephalopathy 04/08/2017   Altered mental status 04/08/2017   Labile blood pressure    Labile blood glucose    Drug induced constipation    Stage 3 chronic kidney disease    Bacteremia    S/P unilateral BKA (below knee amputation), right ([place]) 04/04/2017   PAD (peripheral artery disease) ([place])    Type 2 diabetes mellitus with right diabetic foot ulcer ([place])    Post-operative pain    Legally blind    Upper GI bleed    Streptococcal bacteremia 04/01/2017   Hypokalemia 03/30/2017   Uncontrolled type 2 diabetes mellitus with hyperglycemia, with long-term current use of insulin ([place]) 03/30/2017   Type 2 diabetes mellitus with peripheral neuropathy ([place]) 03/30/2017   AKI (acute kidney injury) ([place]) 03/30/2017   CKD stage 3 due to type 2 diabetes mellitus ([place]) 03/30/2017   Sepsis ([place]) 03/30/2017   Heart murmur 11/22/2016   Hyperlipidemia associated with type 2 diabetes mellitus ([place]) 11/22/2016   Obstructive sleep apnea syndrome 11/22/2016   Essential hypertension 12/17/2012   Janna Arch, PT, DPT   06/19/2019, 3:01 PM  [place] MAIN [facility] SERVICES [place], [place], [address] Phone: [phone]   Fax:  [phone]  Name: Trevor [name] MRN: [id number] Date of Birth: [birth year]

## 2019-06-20 ENCOUNTER — Ambulatory Visit (INDEPENDENT_AMBULATORY_CARE_PROVIDER_SITE_OTHER): Payer: Medicare Other | Admitting: Podiatry

## 2019-06-20 ENCOUNTER — Other Ambulatory Visit: Payer: Self-pay

## 2019-06-20 DIAGNOSIS — M79675 Pain in left toe(s): Secondary | ICD-10-CM

## 2019-06-20 DIAGNOSIS — B351 Tinea unguium: Secondary | ICD-10-CM | POA: Diagnosis not present

## 2019-06-20 DIAGNOSIS — R609 Edema, unspecified: Secondary | ICD-10-CM

## 2019-06-20 DIAGNOSIS — E1142 Type 2 diabetes mellitus with diabetic polyneuropathy: Secondary | ICD-10-CM

## 2019-06-21 ENCOUNTER — Encounter: Payer: Self-pay | Admitting: Podiatry

## 2019-06-21 NOTE — Progress Notes (Signed)
  Subjective:  Patient ID: CRESTON KLAS, male    DOB: 1955/01/12,  MRN: [id number]  Chief Complaint  Patient presents with  . Foot Pain    pt is here for a 3 month nail trim. Pt is also a diabetic type 2   64 y.o. male returns for the above complaint.  Patient presents with thickened elongated mycotic toenails of the left lower extremity x5.  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  Patient states that they have been painful when ambulating.  She has not been able to debride them down herself.  She is a type II diabetic with last A1c of 9.6.  She denies any other acute complaints.  She would like to know if this could be debrided down.  He also has secondary complaint of left lower extremity and would like to discuss if there is any other treatment options aside from [place] therapy there is undergoing.  Objective:  There were no vitals filed for this visit. Podiatric Exam: Vascular: dorsalis pedis and posterior tibial pulses are palpable left capillary return is immediate. Temperature gradient is WNL. Skin turgor WNL  Sensorium: Normal Semmes Weinstein monofilament test. Normal tactile sensation left Nail Exam: Pt has thick disfigured discolored nails with subungual debris noted bilateral entire nail hallux through fifth toenails Ulcer Exam: There is no evidence of ulcer or pre-ulcerative changes or infection. Orthopedic Exam: Muscle tone and strength are WNL. No limitations in general ROM. No crepitus or effusions noted. HAV   left.  Hammer toes 2-5   left.  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 No infection or ulcers.  Left lower extremity swelling 1+ edema.  Unna boot therapy.  Assessment & Plan:  Patient was evaluated and treated and all questions answered.  Swelling to the left lower extremity -I explained the patient the etiology of swelling and various treatment options were extensively discussed.   Patient is currently doing Haematologist therapy I have asked him that this will help in conjunction with elevation as well.  Patient states understanding will continue to elevate as well.  Onychomycosis with pain x5 -Nails palliatively debrided as below. -Educated on self-care  Procedure: Nail Debridement Rationale: pain  Type of Debridement: manual, sharp debridement. Instrumentation: Nail nipper, rotary burr. Number of Nails: 5  Procedures and Treatment: Consent by patient was obtained for treatment procedures. The patient understood the discussion of treatment and procedures well. All questions were answered thoroughly reviewed. Debridement of mycotic and hypertrophic toenails, 1 through 5 bilateral and clearing of subungual debris. No ulceration, no infection noted.  Return Visit-Office Procedure: Patient instructed to return to the office for a follow up visit 3 months for continued evaluation and treatment.  Boneta Lucks, DPM    No follow-ups on file.

## 2019-06-24 ENCOUNTER — Ambulatory Visit: Payer: Medicare Other

## 2019-06-26 ENCOUNTER — Ambulatory Visit: Payer: Medicare Other

## 2019-06-26 ENCOUNTER — Other Ambulatory Visit: Payer: Self-pay

## 2019-06-26 DIAGNOSIS — R2681 Unsteadiness on feet: Secondary | ICD-10-CM

## 2019-06-26 DIAGNOSIS — R2689 Other abnormalities of gait and mobility: Secondary | ICD-10-CM

## 2019-06-26 DIAGNOSIS — M6281 Muscle weakness (generalized): Secondary | ICD-10-CM

## 2019-06-26 DIAGNOSIS — S88111S Complete traumatic amputation at level between knee and ankle, right lower leg, sequela: Secondary | ICD-10-CM
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
## 2019-07-01 ENCOUNTER — Ambulatory Visit: Payer: Medicare Other

## 2019-07-03 ENCOUNTER — Ambulatory Visit: Payer: Medicare Other

## 2019-07-03 ENCOUNTER — Other Ambulatory Visit: Payer: Self-pay

## 2019-07-03 DIAGNOSIS — S88111S Complete traumatic amputation at level between knee and ankle, right lower leg, sequela: Secondary | ICD-10-CM

## 2019-07-03 DIAGNOSIS — R2681 Unsteadiness on feet: Secondary | ICD-10-CM

## 2019-07-03 DIAGNOSIS — R2689 Other abnormalities of gait and mobility: Secondary | ICD-10-CM | POA: Diagnosis not present

## 2019-07-03 DIAGNOSIS — M6281 Muscle weakness (generalized): Secondary | ICD-10-CM
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
## 2019-07-04 NOTE — Addendum Note (Signed)
Addended by: Judene Companion on: 07/04/2019 04:39 PM   Modules accepted: Orders

## 2019-07-08 ENCOUNTER — Ambulatory Visit: Payer: Medicare Other

## 2019-07-08 ENCOUNTER — Other Ambulatory Visit: Payer: Self-pay

## 2019-07-08 DIAGNOSIS — S88111S Complete traumatic amputation at level between knee and ankle, right lower leg, sequela: Secondary | ICD-10-CM

## 2019-07-08 DIAGNOSIS — R2681 Unsteadiness on feet: Secondary | ICD-10-CM

## 2019-07-08 DIAGNOSIS — R2689 Other abnormalities of gait and mobility: Secondary | ICD-10-CM

## 2019-07-08 DIAGNOSIS — M6281 Muscle weakness (generalized): Secondary | ICD-10-CM

## 2019-07-08 NOTE — Therapy (Signed)
[place] MAIN [facility] SERVICES [address] [place], [place], [address] Phone: [phone]   Fax:  [phone]  Physical Therapy Treatment  Patient Details  Name: Trevor [name] MRN: [id number] Date of Birth: September 15, 1955 Referring Provider (PT): Jamse Arn, MD   Encounter Date: 07/08/2019   PT End of Session - 07/08/19 1334    Visit Number 27    Number of Visits 42    Date for PT Re-Evaluation 08/28/19    Authorization Type 7/10 PN 06/03/19    PT Start Time 1309    PT Stop Time 1344    PT Time Calculation (min) 35 min    Equipment Utilized During Treatment Gait belt;Other (comment)    Activity Tolerance Patient limited by fatigue;Patient tolerated treatment well    Behavior During Therapy [facility] for tasks assessed/performed           Past Medical History:  Diagnosis Date  . Diabetes mellitus without complication ([place])   . Heart murmur   . Hyperlipidemia   . Hypertension   . Sleep apnea     Past Surgical History:  Procedure Laterality Date  . AMPUTATION Right 03/31/2017   Procedure: RIGHT FOOT 1ST AND 2ND RAY AMPUTATION;  Surgeon: Newt Minion, MD;  Location: [place];  Service: Orthopedics;  Laterality: Right;  . AMPUTATION Right 04/01/2017   Procedure: AMPUTATION BELOW KNEE;  Surgeon: Newt Minion, MD;  Location: [place];  Service: Orthopedics;  Laterality: Right;  . COLONOSCOPY WITH PROPOFOL N/A 10/02/2017   Procedure: COLONOSCOPY WITH PROPOFOL;  Surgeon: [name], MD;  Location: [facility] ENDOSCOPY;  Service: Gastroenterology;  Laterality: N/A;  . CORNEAL TRANSPLANT      There were no vitals filed for this visit.   Subjective Assessment - 07/08/19 1332    Subjective Patient arrived late due to limited transportation. Had a lot of financial strain this past week. No falls or LOB since last session.    Pertinent History Patient returning to PT after multiple hospitalizations for L foot and COVID-19. Received home health  therapy till 02/14/19  . Patient last seen 12/13/2018 in this outpatient clinic. PMH includes DM, heart murmur, HLD, HTN, sleep apnea, cellulitis of L foot, hypomagnesemia, unilateral complete BKA R (2019), stage III CKD, PAD, legally blind, hypokalemia, AKI, and sepsis. Uses wheelchair primarily at home rather than walking with prosthesis.    Limitations Standing;Walking;House hold activities;Other (comment);Lifting    How long can you sit comfortably? hours /na/    How long can you stand comfortably? 15 minutes with UE support    How long can you walk comfortably? walked in the house    Patient Stated Goals walk better.    Currently in Pain? No/denies            vitals at start of session: 153/82        Nustep Lvl 4 RPM> 60 4 minutes for cardiovascular challenge   Ambulate 200 ft around circumference of therapy gym (2 laps total) no UE support  ; cues for upright posture; close CGA; cues for negotiating obstacles, people, and turns. x4 sets   Standing with stair support:   -Stair hamstring stretch 40 seconds each LE; BUE support    -Stair hip flexor stretch 40 seconds each LE; BUE support    -airex pad; weighted ball eyes closed 30 seconds    -airex pad: one foot on 6' step modified tandem stance with horizontal head turns 2x 20 head turns    -  airex pad: lateral 6" step toe tap 15x each LE    -marching 15 each LE, decreasing from SUE support to no UE support     5lb ankle weight:  Hip abduction 15x each LE Hip extension focus on gluteal activation 15x each LE    seated: cues for body mechanics and sequencing for optimal muscle recruitment   5lb ankle weight: -LAQ 15x each LE, focus on slow steady activation.   -marches 15x each LE, one LE at a time        Pt educated throughout session about proper posture and technique with exercises. Improved exercise technique, movement at target joints, use of target muscles after min to mod verbal, visual, tactile  cues.   Patient's session is limited by late arrival, continued focus on strengthening and stability with mobility. Single limb stability as well as unstable surfaces continue to be an area of improvement. Patient will benefit from skilled physical therapy to increase stability, strength, and mobility for improved quality of life and independence with ADL and iADLs                  PT Education - 07/08/19 1334    Education Details exercise technique, body mechanics    Person(s) Educated Patient    Methods Explanation;Demonstration;Tactile cues;Verbal cues    Comprehension Verbalized understanding;Returned demonstration;Verbal cues required;Tactile cues required            PT Short Term Goals - 07/03/19 1310      PT SHORT TERM GOAL #1   Title Patient will be independent in home exercise program to improve strength/mobility for better functional independence with ADLs.    Baseline 3/3 HEP next session 4/12 HEP compliant 4/28: compliant; 5/24: HEP compliant 6/23 HEp compliant    Time 2    Period Weeks    Status Achieved      PT SHORT TERM GOAL #2   Title Patient will ambulate for 6 minutes without seated rest break to increase tolerance for 6 minute walk test    Baseline 3/3: only able to ambulate 3 minutes and 59 seconds prior to rest break 4/12 6 min walk test deferred till next session 4/14: 6 minutes with one rest break 5/24: 10 seconds from full 6 minutes 6/23: unable to perform this session    Time 2    Period Weeks    Status On-going    Target Date 07/17/19             PT Long Term Goals - 07/03/19 1357      PT LONG TERM GOAL #1   Title Patient will increase six minute walk test distance to >1000 with LRAD for progression to community ambulator and improve gait ability    Baseline 3/3: 700 ft, terminated early 4/12: deferred due to walking down to PT gym 4/14: 740 ft 6 minutes with one seated rest break 5/24: 800 ft with [facility] 6/23: 600 ft terminated due to  back pain    Time 8    Period Weeks    Status On-going    Target Date 08/28/19      PT LONG TERM GOAL #2   Title Patient will increase FOTO score to equal to or greater than 57/100 to demonstrate statistically significant improved mobility and quality of life.    Baseline 3/3: 47/100 risk adjusted 43/100 4/12: 41/100 4/28: 50/100 5/24: 46% 6/23: 47%    Time 8    Period Weeks    Status Partially Met  Target Date 08/28/19      PT LONG TERM GOAL #3   Title Patient will increase Berg Balance score by > 6 points (47/56) to demonstrate decreased fall risk during functional activities.    Baseline 3/3: 41/56 4/12; 49/56    Time 8    Period Weeks    Status Achieved      PT LONG TERM GOAL #4   Title Patient will increase BLE gross strength to 4+/5 as to improve functional strength for independent gait, increased standing tolerance and increased ADL ability.    Baseline 3/3: LLE grossly 4-/5 R grossly 3+ hip 4- knee 4/12: LLE grossly 4- to 4/5 with R 4- hip 6/23: did not tolerate resistance due to back pain    Time 8    Period Weeks    Status Partially Met    Target Date 08/28/19      PT LONG TERM GOAL #5   Title Patient will increase dynamic gait index score to >19/24 as to demonstrate reduced fall risk and improved dynamic gait balance for better safety with community/home ambulation.    Baseline 4/28: 14/24 5/24: 17/24 6/24: 20/24    Time 8    Period Weeks    Status Achieved      PT LONG TERM GOAL #6   Title Patient will be mod I for negotiating curb with LRAD while wearing prosthetic for safe community ambulation.    Baseline needs assistance at this time    Time 8    Period Weeks    Status New    Target Date 08/29/19                 Plan - 07/08/19 1441    Clinical Impression Statement Patient's session is limited by late arrival, continued focus on strengthening and stability with mobility. Single limb stability as well as unstable surfaces continue to be an area  of improvement. Patient will benefit from skilled physical therapy to increase stability, strength, and mobility for improved quality of life and independence with ADL and iADLs    Personal Factors and Comorbidities Age;Comorbidity 3+;Finances;Fitness;Past/Current Experience;Social Background;Time since onset of injury/illness/exacerbation;Transportation    Comorbidities DM, heart murmur, HLD, HTN, sleep apnea, cellulitis of L foot, hypomagnesemia, unilateral complete BKA R (2019), stage III CKD, PAD, legally blind, hypokalemia, AKI, and sepsis    Examination-Activity Limitations Bathing;Bed Mobility;Caring for [employer];Locomotion Level;Lift;Stand;Toileting;Transfers    Examination-Participation Restrictions Church;Cleaning;Community Activity;Driving;Interpersonal Relationship;Laundry;Shop;Meal Prep;Yard 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 complexity    Rehab Potential Fair    PT Frequency 2x / week    PT Duration 8 weeks    PT Treatment/Interventions ADLs/Self Care Home Management;Cryotherapy;Electrical Stimulation;Ultrasound;Traction;Moist Heat;DME Instruction;Gait training;Stair training;Functional mobility training;Therapeutic activities;Therapeutic exercise;Patient/family education;Neuromuscular re-education;Balance training;Prosthetic Training;Wheelchair mobility training;Manual techniques;Manual lymph drainage;Compression bandaging;Taping;Energy conservation;Passive range of motion;Aquatic Therapy;Vestibular    PT Next Visit Plan walk on beach    Consulted and Agree with Plan of Care Patient           Patient will benefit from skilled therapeutic intervention in order to improve the following deficits and impairments:  Abnormal gait, Decreased activity tolerance, Decreased balance, Decreased knowledge of precautions, Decreased endurance, Decreased knowledge of use of DME, Decreased mobility, Decreased range of motion,  Difficulty walking, Decreased strength, Increased edema, Impaired flexibility, Impaired perceived functional ability, Prosthetic Dependency, Postural dysfunction, Improper body mechanics, Pain, Decreased skin integrity, Impaired vision/preception, Impaired sensation  Visit Diagnosis: Muscle weakness (generalized)  Unsteadiness on feet  Other abnormalities of gait and  mobility  Unilateral complete BKA, right, sequela [facility])     Problem List Patient Active Problem List   Diagnosis Date Noted  . Elevated lactic acid level   . Diabetic ketoacidosis without coma associated with type 2 diabetes mellitus ([place])   . Cellulitis of left foot 12/17/2018  . Pain due to onychomycosis of toenail of right foot 07/05/2018  . Abnormality of gait 10/12/2017  . Type 2 diabetes mellitus with hyperglycemia, with long-term current use of insulin ([place])   . Flatulence   . Hypomagnesemia   . Unilateral complete BKA, right, sequela ([place])   . Benign essential HTN   . Hypoalbuminemia due to protein-calorie malnutrition ([place])   . S/P below knee amputation, right ([place]) 04/12/2017  . Acute blood loss anemia   . Other encephalopathy 04/08/2017  . Encephalopathy 04/08/2017  . Altered mental status 04/08/2017  . Labile blood pressure   . Labile blood glucose   . Drug induced constipation   . Stage 3 chronic kidney disease   . Bacteremia   . S/P unilateral BKA (below knee amputation), right ([place]) 04/04/2017  . PAD (peripheral artery disease) ([place])   . Type 2 diabetes mellitus with right diabetic foot ulcer ([place])   . Post-operative pain   . Legally blind   . Upper GI bleed   . Streptococcal bacteremia 04/01/2017  . Hypokalemia 03/30/2017  . Uncontrolled type 2 diabetes mellitus with hyperglycemia, with long-term current use of insulin ([place]) 03/30/2017  . Type 2 diabetes mellitus with peripheral neuropathy ([place]) 03/30/2017  . AKI (acute kidney injury) ([place]) 03/30/2017  . CKD stage 3 due to type 2 diabetes  mellitus ([place]) 03/30/2017  . Sepsis ([place]) 03/30/2017  . Heart murmur 11/22/2016  . Hyperlipidemia associated with type 2 diabetes mellitus ([place]) 11/22/2016  . Obstructive sleep apnea syndrome 11/22/2016  . Essential hypertension 12/17/2012   Janna Arch, PT, DPT   07/08/2019, 2:42 PM  [place] MAIN [facility] SERVICES [address] [place], [place], [address] Phone: [phone]   Fax:  [phone]  Name: Trevor [name] MRN: [id number] Date of Birth: 05-Sep-1955

## 2019-07-10 ENCOUNTER — Ambulatory Visit: Payer: Medicare Other

## 2019-07-10 ENCOUNTER — Other Ambulatory Visit: Payer: Self-pay

## 2019-07-10 DIAGNOSIS — R2681 Unsteadiness on feet: Secondary | ICD-10-CM

## 2019-07-10 DIAGNOSIS — M6281 Muscle weakness (generalized): Secondary | ICD-10-CM

## 2019-07-10 DIAGNOSIS — S88111S Complete traumatic amputation at level between knee and ankle, right lower leg, sequela: Secondary | ICD-10-CM

## 2019-07-10 DIAGNOSIS — R2689 Other abnormalities of gait and mobility: Secondary | ICD-10-CM

## 2019-07-10 NOTE — Therapy (Signed)
[place] MAIN [facility] SERVICES [address] [place], [place], [address] Phone: [phone]   Fax:  [phone]  Physical Therapy Treatment  Patient Details  Name: Trevor [name] MRN: [id number] Date of Birth: 01-01-1956 Referring Provider (PT): Jamse Arn, MD   Encounter Date: 07/10/2019   PT End of Session - 07/10/19 1305    Visit Number 28    Number of Visits 42    Date for PT Re-Evaluation 08/28/19    Authorization Type 8/10 PN 06/03/19    PT Start Time 1300    PT Stop Time 1344    PT Time Calculation (min) 44 min    Equipment Utilized During Treatment Gait belt;Other (comment)    Activity Tolerance Patient limited by fatigue;Patient tolerated treatment well    Behavior During Therapy [facility] for tasks assessed/performed           Past Medical History:  Diagnosis Date   Diabetes mellitus without complication ([place])    Heart murmur    Hyperlipidemia    Hypertension    Sleep apnea     Past Surgical History:  Procedure Laterality Date   AMPUTATION Right 03/31/2017   Procedure: RIGHT FOOT 1ST AND 2ND RAY AMPUTATION;  Surgeon: Newt Minion, MD;  Location: [place];  Service: Orthopedics;  Laterality: Right;   AMPUTATION Right 04/01/2017   Procedure: AMPUTATION BELOW KNEE;  Surgeon: Newt Minion, MD;  Location: [place];  Service: Orthopedics;  Laterality: Right;   COLONOSCOPY WITH PROPOFOL N/A 10/02/2017   Procedure: COLONOSCOPY WITH PROPOFOL;  Surgeon: [name], MD;  Location[facility]COPY;  Service: Gastroenterology;  Laterality: N/A;   CORNEAL TRANSPLANT      There were no vitals filed for this visit.   Subjective Assessment - 07/10/19 1304    Subjective Patient reports no falls or LOB, is very hot outside today, fatigued.    Pertinent History Patient returning to PT after multiple hospitalizations for L foot and COVID-19. Received home health therapy till 02/14/19  . Patient last seen 12/13/2018 in this outpatient  clinic. PMH includes DM, heart murmur, HLD, HTN, sleep apnea, cellulitis of L foot, hypomagnesemia, unilateral complete BKA R (2019), stage III CKD, PAD, legally blind, hypokalemia, AKI, and sepsis. Uses wheelchair primarily at home rather than walking with prosthesis.    Limitations Standing;Walking;House hold activities;Other (comment);Lifting    How long can you sit comfortably? hours /na/    How long can you stand comfortably? 15 minutes with UE support    How long can you walk comfortably? walked in the house    Patient Stated Goals walk better.    Currently in Pain? No/denies                Vitals 127/72 pulse 64    ambulate >1000 ft with 3/4 of the distance w/o cane. One seated rest break for ~4-5 minutes with hamstring stretching for back pain reduction. Close CGA, cues for gait mechanics, arm swing, weight shift and posture. Additional cues for negotiation of turns, people, and obstacles.   Supine: SLR 10x each LE; cues for quad set prior to lift Hip abduction/adduction crossover with straight leg 10x each side Hamstring stretch on PT shoulder with distraction 60 seconds, abductor focus 60 seconds, adductor focus 60 seconds each LE Bridging with stabilization to LE's 10x; very challenging  Seated:  Sit to stand from plinth positioned at 90 90; requires use of arms, reach laterally for a ball stand then throw ball  at target x 15 Seated BTB adduction against PT resistance x 20 each LE Green swiss ball TrA activation press between arms and legs 10x 5 second holds  Skin integrity assessment of L plantar surface of foot. Photograph taken with patient's phone for his tracking of healing.       Pt educated throughout session about proper posture and technique with exercises. Improved exercise technique, movement at target joints, use of target muscles after min to mod verbal, visual, tactile cues.  Patient is increasing duration of ambulation without cane however continues to  be limited by back pain for full ambulation duration. Sit to stands are challenging for the patient requiring UE support with repeated transfer and noticeable focus on weight acceptance on non affected limb.The patient would benefit from further skilled PT intervention to maximize QOL, safety, and functional mobility               PT Education - 07/10/19 1305    Education Details exercise technique, body mechanics    Person(s) Educated Patient    Methods Explanation;Demonstration;Tactile cues;Verbal cues    Comprehension Verbalized understanding;Returned demonstration;Verbal cues required;Tactile cues required            PT Short Term Goals - 07/03/19 1310      PT SHORT TERM GOAL #1   Title Patient will be independent in home exercise program to improve strength/mobility for better functional independence with ADLs.    Baseline 3/3 HEP next session 4/12 HEP compliant 4/28: compliant; 5/24: HEP compliant 6/23 HEp compliant    Time 2    Period Weeks    Status Achieved      PT SHORT TERM GOAL #2   Title Patient will ambulate for 6 minutes without seated rest break to increase tolerance for 6 minute walk test    Baseline 3/3: only able to ambulate 3 minutes and 59 seconds prior to rest break 4/12 6 min walk test deferred till next session 4/14: 6 minutes with one rest break 5/24: 10 seconds from full 6 minutes 6/23: unable to perform this session    Time 2    Period Weeks    Status On-going    Target Date 07/17/19             PT Long Term Goals - 07/03/19 1357      PT LONG TERM GOAL #1   Title Patient will increase six minute walk test distance to >1000 with LRAD for progression to community ambulator and improve gait ability    Baseline 3/3: 700 ft, terminated early 4/12: deferred due to walking down to PT gym 4/14: 740 ft 6 minutes with one seated rest break 5/24: 800 ft with [facility] 6/23: 600 ft terminated due to back pain    Time 8    Period Weeks    Status On-going     Target Date 08/28/19      PT LONG TERM GOAL #2   Title Patient will increase FOTO score to equal to or greater than 57/100 to demonstrate statistically significant improved mobility and quality of life.    Baseline 3/3: 47/100 risk adjusted 43/100 4/12: 41/100 4/28: 50/100 5/24: 46% 6/23: 47%    Time 8    Period Weeks    Status Partially Met    Target Date 08/28/19      PT LONG TERM GOAL #3   Title Patient will increase Berg Balance score by > 6 points (47/56) to demonstrate decreased fall risk during functional activities.  Baseline 3/3: 41/56 4/12; 49/56    Time 8    Period Weeks    Status Achieved      PT LONG TERM GOAL #4   Title Patient will increase BLE gross strength to 4+/5 as to improve functional strength for independent gait, increased standing tolerance and increased ADL ability.    Baseline 3/3: LLE grossly 4-/5 R grossly 3+ hip 4- knee 4/12: LLE grossly 4- to 4/5 with R 4- hip 6/23: did not tolerate resistance due to back pain    Time 8    Period Weeks    Status Partially Met    Target Date 08/28/19      PT LONG TERM GOAL #5   Title Patient will increase dynamic gait index score to >19/24 as to demonstrate reduced fall risk and improved dynamic gait balance for better safety with community/home ambulation.    Baseline 4/28: 14/24 5/24: 17/24 6/24: 20/24    Time 8    Period Weeks    Status Achieved      PT LONG TERM GOAL #6   Title Patient will be mod I for negotiating curb with LRAD while wearing prosthetic for safe community ambulation.    Baseline needs assistance at this time    Time 8    Period Weeks    Status New    Target Date 08/29/19                 Plan - 07/10/19 1354    Clinical Impression Statement Patient is increasing duration of ambulation without cane however continues to be limited by back pain for full ambulation duration. Sit to stands are challenging for the patient requiring UE support with repeated transfer and noticeable  focus on weight acceptance on non affected limb.The patient would benefit from further skilled PT intervention to maximize QOL, safety, and functional mobility    Personal Factors and Comorbidities Age;Comorbidity 3+;Finances;Fitness;Past/Current Experience;Social Background;Time since onset of injury/illness/exacerbation;Transportation    Comorbidities DM, heart murmur, HLD, HTN, sleep apnea, cellulitis of L foot, hypomagnesemia, unilateral complete BKA R (2019), stage III CKD, PAD, legally blind, hypokalemia, AKI, and sepsis    Examination-Activity Limitations Bathing;Bed Mobility;Caring for [employer];Locomotion Level;Lift;Stand;Toileting;Transfers    Examination-Participation Restrictions Church;Cleaning;Community Activity;Driving;Interpersonal Relationship;Laundry;Shop;Meal Prep;Yard 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 complexity    Rehab Potential Fair    PT Frequency 2x / week    PT Duration 8 weeks    PT Treatment/Interventions ADLs/Self Care Home Management;Cryotherapy;Electrical Stimulation;Ultrasound;Traction;Moist Heat;DME Instruction;Gait training;Stair training;Functional mobility training;Therapeutic activities;Therapeutic exercise;Patient/family education;Neuromuscular re-education;Balance training;Prosthetic Training;Wheelchair mobility training;Manual techniques;Manual lymph drainage;Compression bandaging;Taping;Energy conservation;Passive range of motion;Aquatic Therapy;Vestibular    PT Next Visit Plan walk on beach    Consulted and Agree with Plan of Care Patient           Patient will benefit from skilled therapeutic intervention in order to improve the following deficits and impairments:  Abnormal gait, Decreased activity tolerance, Decreased balance, Decreased knowledge of precautions, Decreased endurance, Decreased knowledge of use of DME, Decreased mobility, Decreased range of motion, Difficulty walking,  Decreased strength, Increased edema, Impaired flexibility, Impaired perceived functional ability, Prosthetic Dependency, Postural dysfunction, Improper body mechanics, Pain, Decreased skin integrity, Impaired vision/preception, Impaired sensation  Visit Diagnosis: Muscle weakness (generalized)  Unsteadiness on feet  Other abnormalities of gait and mobility  Unilateral complete BKA, right, sequela [name])     Problem List Patient Active Problem List   Diagnosis Date Noted   Elevated lactic acid level    Diabetic  ketoacidosis without coma associated with type 2 diabetes mellitus ([place])    Cellulitis of left foot 12/17/2018   Pain due to onychomycosis of toenail of right foot 07/05/2018   Abnormality of gait 10/12/2017   Type 2 diabetes mellitus with hyperglycemia, with long-term current use of insulin ([facility])    Flatulence    Hypomagnesemia    Unilateral complete BKA, right, sequela ([facility])    Benign essential HTN    Hypoalbuminemia due to protein-calorie malnutrition ([place])    S/P below knee amputation, right ([place]) 04/12/2017   Acute blood loss anemia    Other encephalopathy 04/08/2017   Encephalopathy 04/08/2017   Altered mental status 04/08/2017   Labile blood pressure    Labile blood glucose    Drug induced constipation    Stage 3 chronic kidney disease    Bacteremia    S/P unilateral BKA (below knee amputation), right ([place]) 04/04/2017   PAD (peripheral artery disease) ([place])    Type 2 diabetes mellitus with right diabetic foot ulcer ([place])    Post-operative pain    Legally blind    Upper GI bleed    Streptococcal bacteremia 04/01/2017   Hypokalemia 03/30/2017   Uncontrolled type 2 diabetes mellitus with hyperglycemia, with long-term current use of insulin ([place]) 03/30/2017   Type 2 diabetes mellitus with peripheral neuropathy ([place]) 03/30/2017   AKI (acute kidney injury) ([place]) 03/30/2017   CKD stage 3 due to type 2 diabetes mellitus ([place])  03/30/2017   Sepsis ([place]) 03/30/2017   Heart murmur 11/22/2016   Hyperlipidemia associated with type 2 diabetes mellitus ([place]) 11/22/2016   Obstructive sleep apnea syndrome 11/22/2016   Essential hypertension 12/17/2012   Janna Arch, PT, DPT   07/10/2019, 1:55 PM   [facility] MAIN [facility] SERVICES [address] [place], [place], [address] Phone: [phone]   Fax:  [phone]  Name: Trevor [name] MRN: [id number] Date of Birth: [birth year]

## 2019-07-17 ENCOUNTER — Ambulatory Visit: Payer: Medicare Other | Attending: Physical Medicine & Rehabilitation

## 2019-07-17 ENCOUNTER — Other Ambulatory Visit: Payer: Self-pay

## 2019-07-17 DIAGNOSIS — S88111S Complete traumatic amputation at level between knee and ankle, right lower leg, sequela: Secondary | ICD-10-CM | POA: Diagnosis present

## 2019-07-17 DIAGNOSIS — M6281 Muscle weakness (generalized): Secondary | ICD-10-CM

## 2019-07-17 DIAGNOSIS — R2689 Other abnormalities of gait and mobility: Secondary | ICD-10-CM

## 2019-07-17 DIAGNOSIS — R2681 Unsteadiness on feet: Secondary | ICD-10-CM

## 2019-07-17 NOTE — Therapy (Signed)
[place] MAIN [facility] SERVICES [address] [place], [place], [address] Phone: [phone]   Fax:  [phone]  Physical Therapy Treatment  Patient Details  Name: Trevor [name] MRN: [id number] Date of Birth: 1955-09-06 Referring Provider (PT): Jamse Arn, MD   Encounter Date: 07/17/2019   PT End of Session - 07/17/19 1334    Visit Number 29    Number of Visits 42    Date for PT Re-Evaluation 08/28/19    Authorization Type 9/10 PN 06/03/19    PT Start Time 6962    PT Stop Time 1344    PT Time Calculation (min) 39 min    Equipment Utilized During Treatment Gait belt;Other (comment)    Activity Tolerance Patient limited by fatigue;Patient tolerated treatment well    Behavior During Therapy [facility] for tasks assessed/performed           Past Medical History:  Diagnosis Date  . Diabetes mellitus without complication ([place])   . Heart murmur   . Hyperlipidemia   . Hypertension   . Sleep apnea     Past Surgical History:  Procedure Laterality Date  . AMPUTATION Right 03/31/2017   Procedure: RIGHT FOOT 1ST AND 2ND RAY AMPUTATION;  Surgeon: Newt Minion, MD;  Location: [place];  Service: Orthopedics;  Laterality: Right;  . AMPUTATION Right 04/01/2017   Procedure: AMPUTATION BELOW KNEE;  Surgeon: Newt Minion, MD;  Location: [place];  Service: Orthopedics;  Laterality: Right;  . COLONOSCOPY WITH PROPOFOL N/A 10/02/2017   Procedure: COLONOSCOPY WITH PROPOFOL;  Surgeon: [name], MD;  Location: [facility] ENDOSCOPY;  Service: Gastroenterology;  Laterality: N/A;  . CORNEAL TRANSPLANT      There were no vitals filed for this visit.   Subjective Assessment - 07/17/19 1308    Subjective Patient reports having a good holiday, was able to go to a cookout, no falls or LOB since last session.    Pertinent History Patient returning to PT after multiple hospitalizations for L foot and COVID-19. Received home health therapy till 02/14/19  . Patient last  seen 12/13/2018 in this outpatient clinic. PMH includes DM, heart murmur, HLD, HTN, sleep apnea, cellulitis of L foot, hypomagnesemia, unilateral complete BKA R (2019), stage III CKD, PAD, legally blind, hypokalemia, AKI, and sepsis. Uses wheelchair primarily at home rather than walking with prosthesis.    Limitations Standing;Walking;House hold activities;Other (comment);Lifting    How long can you sit comfortably? hours /na/    How long can you stand comfortably? 15 minutes with UE support    How long can you walk comfortably? walked in the house    Patient Stated Goals walk better.    Currently in Pain? No/denies              Vitals 139/72 pulse 68    ambulate >1000 ft with no rest breaks with SPC close CGA, cues for gait mechanics, arm swing, weight shift and posture. Additional cues for negotiation of turns, people, and obstacles.    Standing:  airex pad: static stand throwing object at target for pertubations.  airex pad: weighted ball (2000 gr) chest press 12x airex pad: weighted ball (2000 gr) overhead raise 12x  Hip extension step back with clap 12x each LE, more challenging to step back with prosthetic limb  Crane lunge with posterior step back, SUE support 12x each LE   Seated:   Seated BTB adduction against PT resistance x 20 each LE Green swiss ball TrA activation  press between arms and legs 10x 5 second holds   Skin integrity assessment of L plantar surface of foot. Photograph taken with patient's phone for his tracking of healing.      Pt educated throughout session about proper posture and technique with exercises. Improved exercise technique, movement at target joints, use of target muscles after min to mod verbal, visual, tactile cues.   Patient continues to demonstrate increased stability with mobility. He is challenged with posterior step due to limited security feeling with prosthetic limb when he cannot see where he is going. The patient would benefit from  further skilled PT intervention to maximize QOL, safety, and functional mobility                         PT Education - 07/17/19 1308    Education Details exercise technique, body mechanics    Person(s) Educated Patient    Methods Explanation;Demonstration;Tactile cues;Verbal cues    Comprehension Verbalized understanding;Returned demonstration;Verbal cues required;Tactile cues required            PT Short Term Goals - 07/03/19 1310      PT SHORT TERM GOAL #1   Title Patient will be independent in home exercise program to improve strength/mobility for better functional independence with ADLs.    Baseline 3/3 HEP next session 4/12 HEP compliant 4/28: compliant; 5/24: HEP compliant 6/23 HEp compliant    Time 2    Period Weeks    Status Achieved      PT SHORT TERM GOAL #2   Title Patient will ambulate for 6 minutes without seated rest break to increase tolerance for 6 minute walk test    Baseline 3/3: only able to ambulate 3 minutes and 59 seconds prior to rest break 4/12 6 min walk test deferred till next session 4/14: 6 minutes with one rest break 5/24: 10 seconds from full 6 minutes 6/23: unable to perform this session    Time 2    Period Weeks    Status On-going    Target Date 07/17/19             PT Long Term Goals - 07/03/19 1357      PT LONG TERM GOAL #1   Title Patient will increase six minute walk test distance to >1000 with LRAD for progression to community ambulator and improve gait ability    Baseline 3/3: 700 ft, terminated early 4/12: deferred due to walking down to PT gym 4/14: 740 ft 6 minutes with one seated rest break 5/24: 800 ft with [facility] 6/23: 600 ft terminated due to back pain    Time 8    Period Weeks    Status On-going    Target Date 08/28/19      PT LONG TERM GOAL #2   Title Patient will increase FOTO score to equal to or greater than 57/100 to demonstrate statistically significant improved mobility and quality of life.     Baseline 3/3: 47/100 risk adjusted 43/100 4/12: 41/100 4/28: 50/100 5/24: 46% 6/23: 47%    Time 8    Period Weeks    Status Partially Met    Target Date 08/28/19      PT LONG TERM GOAL #3   Title Patient will increase Berg Balance score by > 6 points (47/56) to demonstrate decreased fall risk during functional activities.    Baseline 3/3: 41/56 4/12; 49/56    Time 8    Period Weeks  Status Achieved      PT LONG TERM GOAL #4   Title Patient will increase BLE gross strength to 4+/5 as to improve functional strength for independent gait, increased standing tolerance and increased ADL ability.    Baseline 3/3: LLE grossly 4-/5 R grossly 3+ hip 4- knee 4/12: LLE grossly 4- to 4/5 with R 4- hip 6/23: did not tolerate resistance due to back pain    Time 8    Period Weeks    Status Partially Met    Target Date 08/28/19      PT LONG TERM GOAL #5   Title Patient will increase dynamic gait index score to >19/24 as to demonstrate reduced fall risk and improved dynamic gait balance for better safety with community/home ambulation.    Baseline 4/28: 14/24 5/24: 17/24 6/24: 20/24    Time 8    Period Weeks    Status Achieved      PT LONG TERM GOAL #6   Title Patient will be mod I for negotiating curb with LRAD while wearing prosthetic for safe community ambulation.    Baseline needs assistance at this time    Time 8    Period Weeks    Status New    Target Date 08/29/19                 Plan - 07/17/19 1426    Clinical Impression Statement Patient continues to demonstrate increased stability with mobility. He is challenged with posterior step due to limited security feeling with prosthetic limb when he cannot see where he is going. The patient would benefit from further skilled PT intervention to maximize QOL, safety, and functional mobility    Personal Factors and Comorbidities Age;Comorbidity 3+;Finances;Fitness;Past/Current Experience;Social Background;Time since onset of  injury/illness/exacerbation;Transportation    Comorbidities DM, heart murmur, HLD, HTN, sleep apnea, cellulitis of L foot, hypomagnesemia, unilateral complete BKA R (2019), stage III CKD, PAD, legally blind, hypokalemia, AKI, and sepsis    Examination-Activity Limitations Bathing;Bed Mobility;Caring for [employer];Locomotion Level;Lift;Stand;Toileting;Transfers    Examination-Participation Restrictions Church;Cleaning;Community Activity;Driving;Interpersonal Relationship;Laundry;Shop;Meal Prep;Yard 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 complexity    Rehab Potential Fair    PT Frequency 2x / week    PT Duration 8 weeks    PT Treatment/Interventions ADLs/Self Care Home Management;Cryotherapy;Electrical Stimulation;Ultrasound;Traction;Moist Heat;DME Instruction;Gait training;Stair training;Functional mobility training;Therapeutic activities;Therapeutic exercise;Patient/family education;Neuromuscular re-education;Balance training;Prosthetic Training;Wheelchair mobility training;Manual techniques;Manual lymph drainage;Compression bandaging;Taping;Energy conservation;Passive range of motion;Aquatic Therapy;Vestibular    PT Next Visit Plan walk on beach    Consulted and Agree with Plan of Care Patient           Patient will benefit from skilled therapeutic intervention in order to improve the following deficits and impairments:  Abnormal gait, Decreased activity tolerance, Decreased balance, Decreased knowledge of precautions, Decreased endurance, Decreased knowledge of use of DME, Decreased mobility, Decreased range of motion, Difficulty walking, Decreased strength, Increased edema, Impaired flexibility, Impaired perceived functional ability, Prosthetic Dependency, Postural dysfunction, Improper body mechanics, Pain, Decreased skin integrity, Impaired vision/preception, Impaired sensation  Visit Diagnosis: Muscle weakness  (generalized)  Unsteadiness on feet  Other abnormalities of gait and mobility  Unilateral complete BKA, right, sequela ([place])     Problem List Patient Active Problem List   Diagnosis Date Noted  . Elevated lactic acid level   . Diabetic ketoacidosis without coma associated with type 2 diabetes mellitus ([place])   . Cellulitis of left foot 12/17/2018  . Pain due to onychomycosis of toenail of right foot 07/05/2018  .  Abnormality of gait 10/12/2017  . Type 2 diabetes mellitus with hyperglycemia, with long-term current use of insulin ([place])   . Flatulence   . Hypomagnesemia   . Unilateral complete BKA, right, sequela ([place])   . Benign essential HTN   . Hypoalbuminemia due to protein-calorie malnutrition ([place])   . S/P below knee amputation, right ([place]) 04/12/2017  . Acute blood loss anemia   . Other encephalopathy 04/08/2017  . Encephalopathy 04/08/2017  . Altered mental status 04/08/2017  . Labile blood pressure   . Labile blood glucose   . Drug induced constipation   . Stage 3 chronic kidney disease   . Bacteremia   . S/P unilateral BKA (below knee amputation), right ([place]) 04/04/2017  . PAD (peripheral artery disease) ([place])   . Type 2 diabetes mellitus with right diabetic foot ulcer ([place])   . Post-operative pain   . Legally blind   . Upper GI bleed   . Streptococcal bacteremia 04/01/2017  . Hypokalemia 03/30/2017  . Uncontrolled type 2 diabetes mellitus with hyperglycemia, with long-term current use of insulin ([place]) 03/30/2017  . Type 2 diabetes mellitus with peripheral neuropathy ([place]) 03/30/2017  . AKI (acute kidney injury) ([place]) 03/30/2017  . CKD stage 3 due to type 2 diabetes mellitus ([place]) 03/30/2017  . Sepsis ([place]) 03/30/2017  . Heart murmur 11/22/2016  . Hyperlipidemia associated with type 2 diabetes mellitus ([place]) 11/22/2016  . Obstructive sleep apnea syndrome 11/22/2016  . Essential hypertension 12/17/2012   Janna Arch, PT, DPT   07/17/2019, 2:31 PM  [place] MAIN [facility] SERVICES [address] [place], [place], [address] Phone: [phone]   Fax:  [phone]  Name: Trevor [name] MRN: [id number] Date of Birth: 04/01/1955

## 2019-07-22 ENCOUNTER — Ambulatory Visit: Payer: Medicare Other

## 2019-07-22 ENCOUNTER — Other Ambulatory Visit: Payer: Self-pay

## 2019-07-22 DIAGNOSIS — M6281 Muscle weakness (generalized): Secondary | ICD-10-CM | POA: Diagnosis not present

## 2019-07-22 DIAGNOSIS — S88111S Complete traumatic amputation at level between knee and ankle, right lower leg, sequela: Secondary | ICD-10-CM

## 2019-07-22 DIAGNOSIS — R2681 Unsteadiness on feet: Secondary | ICD-10-CM

## 2019-07-22 DIAGNOSIS — R2689 Other abnormalities of gait and mobility: Secondary | ICD-10-CM

## 2019-07-22 NOTE — Therapy (Signed)
[place] MAIN [facility] SERVICES [address] [place], [place], [address] Phone: [phone]   Fax:  [phone]  Physical Therapy Treatment Physical Therapy Progress Note   Dates of reporting period  06/03/19   to   07/22/19  Patient Details  Name: Trevor [name] MRN: [id number] Date of Birth: 1955/10/25 Referring Provider (PT): Jamse Arn, MD   Encounter Date: 07/22/2019   PT End of Session - 07/22/19 1252    Visit Number 30    Number of Visits 42    Date for PT Re-Evaluation 08/28/19    Authorization Type 10/10 PN 06/03/19; next session 1/10 PN 07/22/19    PT Start Time 1300    PT Stop Time 1344    PT Time Calculation (min) 44 min    Equipment Utilized During Treatment Gait belt;Other (comment)    Activity Tolerance Patient limited by fatigue;Patient tolerated treatment well    Behavior During Therapy [facility] for tasks assessed/performed           Past Medical History:  Diagnosis Date  . Diabetes mellitus without complication ([name])   . Heart murmur   . Hyperlipidemia   . Hypertension   . Sleep apnea     Past Surgical History:  Procedure Laterality Date  . AMPUTATION Right 03/31/2017   Procedure: RIGHT FOOT 1ST AND 2ND RAY AMPUTATION;  Surgeon: Newt Minion, MD;  Location: [place];  Service: Orthopedics;  Laterality: Right;  . AMPUTATION Right 04/01/2017   Procedure: AMPUTATION BELOW KNEE;  Surgeon: Newt Minion, MD;  Location: [place];  Service: Orthopedics;  Laterality: Right;  . COLONOSCOPY WITH PROPOFOL N/A 10/02/2017   Procedure: COLONOSCOPY WITH PROPOFOL;  Surgeon: [name], MD;  Location: [facility] ENDOSCOPY;  Service: Gastroenterology;  Laterality: N/A;  . CORNEAL TRANSPLANT      There were no vitals filed for this visit.   Subjective Assessment - 07/22/19 1303    Subjective Patient reports no falls or LOB since last session. Didn't move around a lot due to the weather and heat.    Pertinent History Patient returning  to PT after multiple hospitalizations for L foot and COVID-19. Received home health therapy till 02/14/19  . Patient last seen 12/13/2018 in this outpatient clinic. PMH includes DM, heart murmur, HLD, HTN, sleep apnea, cellulitis of L foot, hypomagnesemia, unilateral complete BKA R (2019), stage III CKD, PAD, legally blind, hypokalemia, AKI, and sepsis. Uses wheelchair primarily at home rather than walking with prosthesis.    Limitations Standing;Walking;House hold activities;Other (comment);Lifting    How long can you sit comfortably? hours /na/    How long can you stand comfortably? 15 minutes with UE support    How long can you walk comfortably? walked in the house    Patient Stated Goals walk better.    Currently in Pain? No/denies               Goals performed 4 sessions prior on 07/03/19 please refer to this note for further details   Vitals 137/74 pulse 65  ambulate >1000 ft with no rest breaks with SPC close CGA, cues for gait mechanics, arm swing, weight shift and posture. Additional cues for negotiation of turns, people, and obstacles.   Ambulate 100 ft x2 trials with horizontal head turns  Ambulate 100 ft x 2 trials with vertical head turns.    Standing:   Stair hamstring stretch 2x 30 second holds   Seated:    Seated BTB adduction against  PT resistance x 20 each LE Green swiss ball TrA activation press between arms and legs 10x 5 second holds   5lb ankle weight: -march 15x one leg at a time, focus on control -LAQ 15x one leg a time; slow controlled  - ER/IR 15x each LE, one LE at a time  Skin integrity assessment of L plantar surface of foot. Photograph taken with patient's phone for his tracking of healing.      Pt educated throughout session about proper posture and technique with exercises. Improved exercise technique, movement at target joints, use of target muscles after min to mod verbal, visual, tactile cues.   Patient continues to demonstrate increased  stability with mobility. He is challenged with posterior step due to limited security feeling with prosthetic limb when he cannot see where he is going. The patient would benefit from further skilled PT intervention to maximize QOL, safety, and functional mobility    Patient's condition has the potential to improve in response to therapy. Maximum improvement is yet to be obtained. The anticipated improvement is attainable and reasonable in a generally predictable time.  Patient reports he is getting more comfortable with ambulating. Wants to increase his duration of walking as well as to be able to walk on the beach.    Goals performed 4 sessions prior on 07/03/19 please refer to this note for further details. Patient has bruising on posterior aspect of residual limb, educated on need to call prosthetist to schedule a fit. No skin breakdown noted. Patient's condition has the potential to improve in response to therapy. Maximum improvement is yet to be obtained. The anticipated improvement is attainable and reasonable in a generally predictable time. The patient would benefit from further skilled PT intervention to maximize QOL, safety, and functional mobility                    PT Education - 07/22/19 1251    Education Details exercise technique, body mechanics    Person(s) Educated Patient    Methods Explanation;Demonstration;Tactile cues;Verbal cues    Comprehension Verbalized understanding;Returned demonstration;Verbal cues required;Tactile cues required            PT Short Term Goals - 07/03/19 1310      PT SHORT TERM GOAL #1   Title Patient will be independent in home exercise program to improve strength/mobility for better functional independence with ADLs.    Baseline 3/3 HEP next session 4/12 HEP compliant 4/28: compliant; 5/24: HEP compliant 6/23 HEp compliant    Time 2    Period Weeks    Status Achieved      PT SHORT TERM GOAL #2   Title Patient will ambulate for  6 minutes without seated rest break to increase tolerance for 6 minute walk test    Baseline 3/3: only able to ambulate 3 minutes and 59 seconds prior to rest break 4/12 6 min walk test deferred till next session 4/14: 6 minutes with one rest break 5/24: 10 seconds from full 6 minutes 6/23: unable to perform this session    Time 2    Period Weeks    Status On-going    Target Date 07/17/19             PT Long Term Goals - 07/03/19 1357      PT LONG TERM GOAL #1   Title Patient will increase six minute walk test distance to >1000 with LRAD for progression to community ambulator and improve gait ability  Baseline 3/3: 700 ft, terminated early 4/12: deferred due to walking down to PT gym 4/14: 740 ft 6 minutes with one seated rest break 5/24: 800 ft with [facility] 6/23: 600 ft terminated due to back pain    Time 8    Period Weeks    Status On-going    Target Date 08/28/19      PT LONG TERM GOAL #2   Title Patient will increase FOTO score to equal to or greater than 57/100 to demonstrate statistically significant improved mobility and quality of life.    Baseline 3/3: 47/100 risk adjusted 43/100 4/12: 41/100 4/28: 50/100 5/24: 46% 6/23: 47%    Time 8    Period Weeks    Status Partially Met    Target Date 08/28/19      PT LONG TERM GOAL #3   Title Patient will increase Berg Balance score by > 6 points (47/56) to demonstrate decreased fall risk during functional activities.    Baseline 3/3: 41/56 4/12; 49/56    Time 8    Period Weeks    Status Achieved      PT LONG TERM GOAL #4   Title Patient will increase BLE gross strength to 4+/5 as to improve functional strength for independent gait, increased standing tolerance and increased ADL ability.    Baseline 3/3: LLE grossly 4-/5 R grossly 3+ hip 4- knee 4/12: LLE grossly 4- to 4/5 with R 4- hip 6/23: did not tolerate resistance due to back pain    Time 8    Period Weeks    Status Partially Met    Target Date 08/28/19      PT LONG TERM  GOAL #5   Title Patient will increase dynamic gait index score to >19/24 as to demonstrate reduced fall risk and improved dynamic gait balance for better safety with community/home ambulation.    Baseline 4/28: 14/24 5/24: 17/24 6/24: 20/24    Time 8    Period Weeks    Status Achieved      PT LONG TERM GOAL #6   Title Patient will be mod I for negotiating curb with LRAD while wearing prosthetic for safe community ambulation.    Baseline needs assistance at this time    Time 8    Period Weeks    Status New    Target Date 08/29/19                 Plan - 07/22/19 1655    Clinical Impression Statement Goals performed 4 sessions prior on 07/03/19 please refer to this note for further details. Patient has bruising on posterior aspect of residual limb, educated on need to call prosthetist to schedule a fit. No skin breakdown noted. Patient's condition has the potential to improve in response to therapy. Maximum improvement is yet to be obtained. The anticipated improvement is attainable and reasonable in a generally predictable time. The patient would benefit from further skilled PT intervention to maximize QOL, safety, and functional mobility    Personal Factors and Comorbidities Age;Comorbidity 3+;Finances;Fitness;Past/Current Experience;Social Background;Time since onset of injury/illness/exacerbation;Transportation    Comorbidities DM, heart murmur, HLD, HTN, sleep apnea, cellulitis of L foot, hypomagnesemia, unilateral complete BKA R (2019), stage III CKD, PAD, legally blind, hypokalemia, AKI, and sepsis    Examination-Activity Limitations Bathing;Bed Mobility;Caring for [employer];Locomotion Level;Lift;Stand;Toileting;Transfers    Examination-Participation Restrictions Church;Cleaning;Community Activity;Driving;Interpersonal Relationship;Laundry;Shop;Meal Prep;Yard 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 complexity    Rehab  Potential Fair    PT  Frequency 2x / week    PT Duration 8 weeks    PT Treatment/Interventions ADLs/Self Care Home Management;Cryotherapy;Electrical Stimulation;Ultrasound;Traction;Moist Heat;DME Instruction;Gait training;Stair training;Functional mobility training;Therapeutic activities;Therapeutic exercise;Patient/family education;Neuromuscular re-education;Balance training;Prosthetic Training;Wheelchair mobility training;Manual techniques;Manual lymph drainage;Compression bandaging;Taping;Energy conservation;Passive range of motion;Aquatic Therapy;Vestibular    PT Next Visit Plan walk on beach    Consulted and Agree with Plan of Care Patient           Patient will benefit from skilled therapeutic intervention in order to improve the following deficits and impairments:  Abnormal gait, Decreased activity tolerance, Decreased balance, Decreased knowledge of precautions, Decreased endurance, Decreased knowledge of use of DME, Decreased mobility, Decreased range of motion, Difficulty walking, Decreased strength, Increased edema, Impaired flexibility, Impaired perceived functional ability, Prosthetic Dependency, Postural dysfunction, Improper body mechanics, Pain, Decreased skin integrity, Impaired vision/preception, Impaired sensation  Visit Diagnosis: Muscle weakness (generalized)  Unsteadiness on feet  Other abnormalities of gait and mobility  Unilateral complete BKA, right, sequela ([place])     Problem List Patient Active Problem List   Diagnosis Date Noted  . Elevated lactic acid level   . Diabetic ketoacidosis without coma associated with type 2 diabetes mellitus ([place])   . Cellulitis of left foot 12/17/2018  . Pain due to onychomycosis of toenail of right foot 07/05/2018  . Abnormality of gait 10/12/2017  . Type 2 diabetes mellitus with hyperglycemia, with long-term current use of insulin ([place])   . Flatulence   . Hypomagnesemia   . Unilateral complete BKA, right, sequela ([place])   .  Benign essential HTN   . Hypoalbuminemia due to protein-calorie malnutrition ([place])   . S/P below knee amputation, right ([place]) 04/12/2017  . Acute blood loss anemia   . Other encephalopathy 04/08/2017  . Encephalopathy 04/08/2017  . Altered mental status 04/08/2017  . Labile blood pressure   . Labile blood glucose   . Drug induced constipation   . Stage 3 chronic kidney disease   . Bacteremia   . S/P unilateral BKA (below knee amputation), right ([place]) 04/04/2017  . PAD (peripheral artery disease) ([place])   . Type 2 diabetes mellitus with right diabetic foot ulcer ([place])   . Post-operative pain   . Legally blind   . Upper GI bleed   . Streptococcal bacteremia 04/01/2017  . Hypokalemia 03/30/2017  . Uncontrolled type 2 diabetes mellitus with hyperglycemia, with long-term current use of insulin ([place]) 03/30/2017  . Type 2 diabetes mellitus with peripheral neuropathy ([place]) 03/30/2017  . AKI (acute kidney injury) ([place]) 03/30/2017  . CKD stage 3 due to type 2 diabetes mellitus ([place]) 03/30/2017  . Sepsis ([place]) 03/30/2017  . Heart murmur 11/22/2016  . Hyperlipidemia associated with type 2 diabetes mellitus ([place]) 11/22/2016  . Obstructive sleep apnea syndrome 11/22/2016  . Essential hypertension 12/17/2012   Janna Arch, PT, DPT   07/22/2019, 4:56 PM  [place] MAIN [facility] SERVICES [address] [place], [place], [address] Phone: [phone]   Fax:  [phone]  Name: Trevor [name] MRN: [id number] Date of Birth: 1955-11-14

## 2019-07-24 ENCOUNTER — Ambulatory Visit: Payer: Medicare Other

## 2019-07-24 ENCOUNTER — Other Ambulatory Visit: Payer: Self-pay

## 2019-07-24 DIAGNOSIS — M6281 Muscle weakness (generalized): Secondary | ICD-10-CM | POA: Diagnosis not present

## 2019-07-24 DIAGNOSIS — R2689 Other abnormalities of gait and mobility: Secondary | ICD-10-CM

## 2019-07-24 DIAGNOSIS — S88111S Complete traumatic amputation at level between knee and ankle, right lower leg, sequela: Secondary | ICD-10-CM

## 2019-07-24 DIAGNOSIS — R2681 Unsteadiness on feet: Secondary | ICD-10-CM

## 2019-07-24 NOTE — Therapy (Signed)
[place] MAIN [facility] SERVICES [address] [place], [place], [address] Phone: [phone]   Fax:  [phone]  Physical Therapy Treatment  Patient Details  Name: Trevor [name] MRN: [id number] Date of Birth: 09/28/1955 Referring Provider (PT): Jamse Arn, MD   Encounter Date: 07/24/2019   PT End of Session - 07/24/19 1339    Visit Number 31    Number of Visits 42    Date for PT Re-Evaluation 08/28/19    Authorization Type 1/10 PN 07/22/19    PT Start Time 1305    PT Stop Time 1344    PT Time Calculation (min) 39 min    Equipment Utilized During Treatment Gait belt;Other (comment)    Activity Tolerance Patient limited by fatigue;Patient tolerated treatment well    Behavior During Therapy [facility] for tasks assessed/performed           Past Medical History:  Diagnosis Date  . Diabetes mellitus without complication ([place])   . Heart murmur   . Hyperlipidemia   . Hypertension   . Sleep apnea     Past Surgical History:  Procedure Laterality Date  . AMPUTATION Right 03/31/2017   Procedure: RIGHT FOOT 1ST AND 2ND RAY AMPUTATION;  Surgeon: Newt Minion, MD;  Location: [place];  Service: Orthopedics;  Laterality: Right;  . AMPUTATION Right 04/01/2017   Procedure: AMPUTATION BELOW KNEE;  Surgeon: Newt Minion, MD;  Location: [place];  Service: Orthopedics;  Laterality: Right;  . COLONOSCOPY WITH PROPOFOL N/A 10/02/2017   Procedure: COLONOSCOPY WITH PROPOFOL;  Surgeon: [name], MD;  Location: [facility] ENDOSCOPY;  Service: Gastroenterology;  Laterality: N/A;  . CORNEAL TRANSPLANT      There were no vitals filed for this visit.   Subjective Assessment - 07/24/19 1330    Subjective Patient will be meeting with prosthetist on Friday for re-fitting. No falls or LOB since last session.    Pertinent History Patient returning to PT after multiple hospitalizations for L foot and COVID-19. Received home health therapy till 02/14/19  . Patient last  seen 12/13/2018 in this outpatient clinic. PMH includes DM, heart murmur, HLD, HTN, sleep apnea, cellulitis of L foot, hypomagnesemia, unilateral complete BKA R (2019), stage III CKD, PAD, legally blind, hypokalemia, AKI, and sepsis. Uses wheelchair primarily at home rather than walking with prosthesis.    Limitations Standing;Walking;House hold activities;Other (comment);Lifting    How long can you sit comfortably? hours /na/    How long can you stand comfortably? 15 minutes with UE support    How long can you walk comfortably? walked in the house    Patient Stated Goals walk better.    Currently in Pain? No/denies                  Vitals 138/76 pulse 64    ambulate >1000 ft with no rest breaks without SPC close CGA, cues for gait mechanics, arm swing, weight shift and posture. Additional cues for negotiation of turns, people, and obstacles.    Supine: 3lb ankle weight: -heel slides, alternating, 15x each LE   -Straight leg abduction with alt LE in hooklying for protection of low back 12x each LE  Bridge: stabilization to LE's provided 10x; arms crossed Modified single limb bridge with alt LE on bolster 10x each side, arms crossed  Single leg arc 10x each LE, opp LE in hooklying.   Hooklying: knees on bolster: adduction ball squeeze 15x 3 second holds  Sidelie: Hip extension with  min A 10x each LE Hip abduction straight leg 10x each LE Hip flexor stretch 30 second hold each LE   Seated:    Seated BTB adduction against PT resistance x 20 each LE      Pt educated throughout session about proper posture and technique with exercises. Improved exercise technique, movement at target joints, use of target muscles after min to mod verbal, visual, tactile cues.   Patient is progressing with ambulatory duration/capacity without AD, performing entire lap around rehab department and lower level without AD. Supine interventions challenging for patient at this time with stabilization  required for bridging due to poor alignment with knee flexion with prosthesis. .The patient would benefit from further skilled PT intervention to maximize QOL, safety, and functional mobility                     PT Education - 07/24/19 1330    Education Details exercise technique, sequencing of interventions    Person(s) Educated Patient    Methods Explanation;Demonstration;Tactile cues;Verbal cues    Comprehension Verbalized understanding;Returned demonstration;Verbal cues required;Tactile cues required            PT Short Term Goals - 07/03/19 1310      PT SHORT TERM GOAL #1   Title Patient will be independent in home exercise program to improve strength/mobility for better functional independence with ADLs.    Baseline 3/3 HEP next session 4/12 HEP compliant 4/28: compliant; 5/24: HEP compliant 6/23 HEp compliant    Time 2    Period Weeks    Status Achieved      PT SHORT TERM GOAL #2   Title Patient will ambulate for 6 minutes without seated rest break to increase tolerance for 6 minute walk test    Baseline 3/3: only able to ambulate 3 minutes and 59 seconds prior to rest break 4/12 6 min walk test deferred till next session 4/14: 6 minutes with one rest break 5/24: 10 seconds from full 6 minutes 6/23: unable to perform this session    Time 2    Period Weeks    Status On-going    Target Date 07/17/19             PT Long Term Goals - 07/03/19 1357      PT LONG TERM GOAL #1   Title Patient will increase six minute walk test distance to >1000 with LRAD for progression to community ambulator and improve gait ability    Baseline 3/3: 700 ft, terminated early 4/12: deferred due to walking down to PT gym 4/14: 740 ft 6 minutes with one seated rest break 5/24: 800 ft with [facility] 6/23: 600 ft terminated due to back pain    Time 8    Period Weeks    Status On-going    Target Date 08/28/19      PT LONG TERM GOAL #2   Title Patient will increase FOTO score to equal  to or greater than 57/100 to demonstrate statistically significant improved mobility and quality of life.    Baseline 3/3: 47/100 risk adjusted 43/100 4/12: 41/100 4/28: 50/100 5/24: 46% 6/23: 47%    Time 8    Period Weeks    Status Partially Met    Target Date 08/28/19      PT LONG TERM GOAL #3   Title Patient will increase Berg Balance score by > 6 points (47/56) to demonstrate decreased fall risk during functional activities.    Baseline 3/3: 41/56 4/12;  49/56    Time 8    Period Weeks    Status Achieved      PT LONG TERM GOAL #4   Title Patient will increase BLE gross strength to 4+/5 as to improve functional strength for independent gait, increased standing tolerance and increased ADL ability.    Baseline 3/3: LLE grossly 4-/5 R grossly 3+ hip 4- knee 4/12: LLE grossly 4- to 4/5 with R 4- hip 6/23: did not tolerate resistance due to back pain    Time 8    Period Weeks    Status Partially Met    Target Date 08/28/19      PT LONG TERM GOAL #5   Title Patient will increase dynamic gait index score to >19/24 as to demonstrate reduced fall risk and improved dynamic gait balance for better safety with community/home ambulation.    Baseline 4/28: 14/24 5/24: 17/24 6/24: 20/24    Time 8    Period Weeks    Status Achieved      PT LONG TERM GOAL #6   Title Patient will be mod I for negotiating curb with LRAD while wearing prosthetic for safe community ambulation.    Baseline needs assistance at this time    Time 8    Period Weeks    Status New    Target Date 08/29/19                 Plan - 07/24/19 1347    Clinical Impression Statement Patient is progressing with ambulatory duration/capacity without AD, performing entire lap around rehab department and lower level without AD. Supine interventions challenging for patient at this time with stabilization required for bridging due to poor alignment with knee flexion with prosthesis. .The patient would benefit from further  skilled PT intervention to maximize QOL, safety, and functional mobility    Personal Factors and Comorbidities Age;Comorbidity 3+;Finances;Fitness;Past/Current Experience;Social Background;Time since onset of injury/illness/exacerbation;Transportation    Comorbidities DM, heart murmur, HLD, HTN, sleep apnea, cellulitis of L foot, hypomagnesemia, unilateral complete BKA R (2019), stage III CKD, PAD, legally blind, hypokalemia, AKI, and sepsis    Examination-Activity Limitations Bathing;Bed Mobility;Caring for [employer];Locomotion Level;Lift;Stand;Toileting;Transfers    Examination-Participation Restrictions Church;Cleaning;Community Activity;Driving;Interpersonal Relationship;Laundry;Shop;Meal Prep;Yard 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 complexity    Rehab Potential Fair    PT Frequency 2x / week    PT Duration 8 weeks    PT Treatment/Interventions ADLs/Self Care Home Management;Cryotherapy;Electrical Stimulation;Ultrasound;Traction;Moist Heat;DME Instruction;Gait training;Stair training;Functional mobility training;Therapeutic activities;Therapeutic exercise;Patient/family education;Neuromuscular re-education;Balance training;Prosthetic Training;Wheelchair mobility training;Manual techniques;Manual lymph drainage;Compression bandaging;Taping;Energy conservation;Passive range of motion;Aquatic Therapy;Vestibular    PT Next Visit Plan walk on beach    Consulted and Agree with Plan of Care Patient           Patient will benefit from skilled therapeutic intervention in order to improve the following deficits and impairments:  Abnormal gait, Decreased activity tolerance, Decreased balance, Decreased knowledge of precautions, Decreased endurance, Decreased knowledge of use of DME, Decreased mobility, Decreased range of motion, Difficulty walking, Decreased strength, Increased edema, Impaired flexibility, Impaired perceived functional  ability, Prosthetic Dependency, Postural dysfunction, Improper body mechanics, Pain, Decreased skin integrity, Impaired vision/preception, Impaired sensation  Visit Diagnosis: Muscle weakness (generalized)  Unsteadiness on feet  Other abnormalities of gait and mobility  Unilateral complete BKA, right, sequela ([place])     Problem List Patient Active Problem List   Diagnosis Date Noted  . Elevated lactic acid level   . Diabetic ketoacidosis without coma associated with type  2 diabetes mellitus ([place])   . Cellulitis of left foot 12/17/2018  . Pain due to onychomycosis of toenail of right foot 07/05/2018  . Abnormality of gait 10/12/2017  . Type 2 diabetes mellitus with hyperglycemia, with long-term current use of insulin ([place])   . Flatulence   . Hypomagnesemia   . Unilateral complete BKA, right, sequela ([place])   . Benign essential HTN   . Hypoalbuminemia due to protein-calorie malnutrition ([place])   . S/P below knee amputation, right ([place]) 04/12/2017  . Acute blood loss anemia   . Other encephalopathy 04/08/2017  . Encephalopathy 04/08/2017  . Altered mental status 04/08/2017  . Labile blood pressure   . Labile blood glucose   . Drug induced constipation   . Stage 3 chronic kidney disease   . Bacteremia   . S/P unilateral BKA (below knee amputation), right ([place]) 04/04/2017  . PAD (peripheral artery disease) ([place])   . Type 2 diabetes mellitus with right diabetic foot ulcer ([place])   . Post-operative pain   . Legally blind   . Upper GI bleed   . Streptococcal bacteremia 04/01/2017  . Hypokalemia 03/30/2017  . Uncontrolled type 2 diabetes mellitus with hyperglycemia, with long-term current use of insulin ([place]) 03/30/2017  . Type 2 diabetes mellitus with peripheral neuropathy ([place]) 03/30/2017  . AKI (acute kidney injury) ([place]) 03/30/2017  . CKD stage 3 due to type 2 diabetes mellitus ([place]) 03/30/2017  . Sepsis ([place]) 03/30/2017  . Heart murmur 11/22/2016  . Hyperlipidemia  associated with type 2 diabetes mellitus ([place]) 11/22/2016  . Obstructive sleep apnea syndrome 11/22/2016  . Essential hypertension 12/17/2012   Janna Arch, PT, DPT   07/24/2019, 1:47 PM  [place] MAIN [facility] SERVICES [address] [place], [place], [address] Phone: [phone]   Fax:  [phone]  Name: Trevor [name] MRN: [id number] Date of Birth: October 24, 1955

## 2019-07-28 ENCOUNTER — Other Ambulatory Visit: Payer: Self-pay

## 2019-07-28 DIAGNOSIS — Z89511 Acquired absence of right leg below knee: Secondary | ICD-10-CM | POA: Insufficient documentation

## 2019-07-28 DIAGNOSIS — Z794 Long term (current) use of insulin: Secondary | ICD-10-CM | POA: Diagnosis not present

## 2019-07-28 DIAGNOSIS — I129 Hypertensive chronic kidney disease with stage 1 through stage 4 chronic kidney disease, or unspecified chronic kidney disease: Secondary | ICD-10-CM | POA: Diagnosis not present

## 2019-07-28 DIAGNOSIS — N183 Chronic kidney disease, stage 3 unspecified: Secondary | ICD-10-CM | POA: Diagnosis not present

## 2019-07-28 DIAGNOSIS — E1122 Type 2 diabetes mellitus with diabetic chronic kidney disease: Secondary | ICD-10-CM | POA: Insufficient documentation

## 2019-07-28 DIAGNOSIS — Z7982 Long term (current) use of aspirin: Secondary | ICD-10-CM | POA: Insufficient documentation

## 2019-07-28 DIAGNOSIS — K5900 Constipation, unspecified: Secondary | ICD-10-CM | POA: Insufficient documentation

## 2019-07-28 DIAGNOSIS — E1140 Type 2 diabetes mellitus with diabetic neuropathy, unspecified: Secondary | ICD-10-CM | POA: Diagnosis not present

## 2019-07-28 DIAGNOSIS — Z79899 Other long term (current) drug therapy: Secondary | ICD-10-CM | POA: Insufficient documentation

## 2019-07-28 NOTE — ED Triage Notes (Signed)
Patient reports no bowel movement for 3 days.  Patient reports rarely has issues with constipation.

## 2019-07-29 ENCOUNTER — Ambulatory Visit: Payer: Medicare Other

## 2019-07-29 ENCOUNTER — Emergency Department: Payer: Medicare Other

## 2019-07-29 ENCOUNTER — Emergency Department
Admission: EM | Admit: 2019-07-29 | Discharge: 2019-07-29 | Disposition: A | Discharge status: Home / Self Care | Payer: Medicare Other | Attending: Emergency Medicine | Admitting: Emergency Medicine

## 2019-07-29 ENCOUNTER — Encounter: Payer: Self-pay | Admitting: Radiology

## 2019-07-29 DIAGNOSIS — K5900 Constipation, unspecified: Secondary | ICD-10-CM

## 2019-07-29 HISTORY — DX: COVID-19: U07.1

## 2019-07-29 MED ORDER — LACTULOSE 10 GM/15ML PO SOLN
20.0000 g | Freq: Every day | ORAL | 0 refills | Status: AC | PRN
Start: 2019-07-29 — End: ?

## 2019-07-29 MED ORDER — LACTULOSE 10 GM/15ML PO SOLN
30.0000 g | Freq: Once | ORAL | Status: AC
  Administered 2019-07-29: 30 g via ORAL
  Filled 2019-07-29: qty 60

## 2019-07-29 NOTE — ED Notes (Signed)
Patient assisted to the bathroom 

## 2019-07-29 NOTE — ED Provider Notes (Signed)
[facility] Emergency Department Provider Note   ____________________________________________   First MD Initiated Contact with Patient 07/29/19 0532     (approximate)  I have reviewed the triage vital signs and the nursing notes.   HISTORY  Chief Complaint Constipation    HPI AWAB ABEBE is a 64 y.o. male who presents to the ED from home with a chief complaint of constipation. Normally has daily bowel movements but has not had one in 2 to 3 days. He was able to disimpact himself tonight with retrieval of some hard stool balls. Denies fever, chest pain, shortness of breath, abdominal pain, nausea, vomiting or urinary retention.      Past Medical History:  Diagnosis Date  . COVID-29 Dec 2018  . Diabetes mellitus without complication ([place])   . Heart murmur   . Hyperlipidemia   . Hypertension   . Sleep apnea     Patient Active Problem List   Diagnosis Date Noted  . Elevated lactic acid level   . Diabetic ketoacidosis without coma associated with type 2 diabetes mellitus ([place])   . Cellulitis of left foot 12/17/2018  . Pain due to onychomycosis of toenail of right foot 07/05/2018  . Abnormality of gait 10/12/2017  . Type 2 diabetes mellitus with hyperglycemia, with long-term current use of insulin ([place])   . Flatulence   . Hypomagnesemia   . Unilateral complete BKA, right, sequela ([place])   . Benign essential HTN   . Hypoalbuminemia due to protein-calorie malnutrition ([place])   . S/P below knee amputation, right ([place]) 04/12/2017  . Acute blood loss anemia   . Other encephalopathy 04/08/2017  . Encephalopathy 04/08/2017  . Altered mental status 04/08/2017  . Labile blood pressure   . Labile blood glucose   . Drug induced constipation   . Stage 3 chronic kidney disease   . Bacteremia   . S/P unilateral BKA (below knee amputation), right ([place]) 04/04/2017  . PAD (peripheral artery disease) ([place])   . Type 2 diabetes mellitus with right  diabetic foot ulcer ([place])   . Post-operative pain   . Legally blind   . Upper GI bleed   . Streptococcal bacteremia 04/01/2017  . Hypokalemia 03/30/2017  . Uncontrolled type 2 diabetes mellitus with hyperglycemia, with long-term current use of insulin ([place]) 03/30/2017  . Type 2 diabetes mellitus with peripheral neuropathy ([place]) 03/30/2017  . AKI (acute kidney injury) ([place]) 03/30/2017  . CKD stage 3 due to type 2 diabetes mellitus ([place]) 03/30/2017  . Sepsis ([name]) 03/30/2017  . Heart murmur 11/22/2016  . Hyperlipidemia associated with type 2 diabetes mellitus ([place]) 11/22/2016  . Obstructive sleep apnea syndrome 11/22/2016  . Essential hypertension 12/17/2012    Past Surgical History:  Procedure Laterality Date  . AMPUTATION Right 03/31/2017   Procedure: RIGHT FOOT 1ST AND 2ND RAY AMPUTATION;  Surgeon: Newt Minion, MD;  Location: [place];  Service: Orthopedics;  Laterality: Right;  . AMPUTATION Right 04/01/2017   Procedure: AMPUTATION BELOW KNEE;  Surgeon: Newt Minion, MD;  Location: [place];  Service: Orthopedics;  Laterality: Right;  . COLONOSCOPY WITH PROPOFOL N/A 10/02/2017   Procedure: COLONOSCOPY WITH PROPOFOL;  Surgeon: [name], MD;  Location: [facility] ENDOSCOPY;  Service: Gastroenterology;  Laterality: N/A;  . CORNEAL TRANSPLANT      Prior to Admission medications   Medication Sig Start Date End Date Taking? Authorizing Provider  ACCU-CHEK AVIVA PLUS test strip USE AS DIRECTED TO CHECK BLOOD SUGAR THREE  TIMES A DAY 06/11/18   [provider]  acetaminophen (TYLENOL) 325 MG tablet Take 2 tablets (650 mg total) by mouth every 6 (six) hours as needed for mild pain (or Fever >/= 101). 04/12/17   Katherine Roan, MD  albuterol (VENTOLIN HFA) 108 (90 Base) MCG/ACT inhaler Inhale 2 puffs into the lungs every 6 (six) hours as needed for wheezing or shortness of breath. 12/28/18   Duanne Guess, PA-C  ammonium lactate (LAC-HYDRIN) 12 % cream Apply topically as needed for dry  skin. 02/05/18   Gardiner Barefoot, DPM  amoxicillin-clavulanate (AUGMENTIN) 875-125 MG tablet Take 1 tablet by mouth every 12 (twelve) hours. 12/19/18   Nolberto Hanlon, MD  aspirin EC 81 MG tablet Take 81 mg by mouth daily.    [provider]  atenolol (TENORMIN) 100 MG tablet Take 100 mg by mouth daily.     [provider]  atorvastatin (LIPITOR) 40 MG tablet Take 40 mg by mouth daily. 11/10/18   [provider]  azithromycin (ZITHROMAX Z-PAK) 250 MG tablet Take 2 tablets (500 mg) on  Day 1,  followed by 1 tablet (250 mg) once daily on Days 2 through 5. 12/28/18   Duanne Guess, PA-C  BD INSULIN SYRINGE U/F 31G X 5/16" 1 ML MISC  07/23/18   [provider]  clobetasol ointment (TEMOVATE) 5.36 % Apply 1 application topically 2 (two) times daily. 01/02/19   [provider]  fluticasone (FLONASE) 50 MCG/ACT nasal spray Place 1 spray into both nostrils daily. 12/20/18   Nolberto Hanlon, MD  glipiZIDE (GLUCOTROL) 10 MG tablet Take 10 mg by mouth 2 (two) times daily before a meal.    [provider]  insulin aspart (NOVOLOG) 100 UNIT/ML FlexPen Inject 10 Units into the skin 3 (three) times daily with meals. 04/27/17   Angiulli, Lavon Paganini, PA-C  insulin detemir (LEVEMIR) 100 unit/ml SOLN Inject 0.34 mLs (34 Units total) into the skin at bedtime. 04/27/17   Angiulli, Lavon Paganini, PA-C  lactulose (CHRONULAC) 10 GM/15ML solution Take 30 mLs (20 g total) by mouth daily as needed for mild constipation. 07/29/19   Paulette Blanch, MD  losartan (COZAAR) 100 MG tablet Take 100 mg by mouth daily.    [provider]  metFORMIN (GLUCOPHAGE) 1000 MG tablet Take 1,000 mg by mouth 2 (two) times daily. 02/01/19   [provider]  NIFEdipine (PROCARDIA-XL/NIFEDICAL-XL) 30 MG 24 hr tablet Take 120 mg by mouth daily.    [provider]  polyethylene glycol (MIRALAX / GLYCOLAX) packet Take 17 g by mouth daily as needed for mild constipation. 04/04/17   Colbert Ewing, MD  potassium chloride 20 MEQ TBCR Take 40 mEq by mouth 2 (two) times daily for 3 days. 12/28/18 12/31/18  Duanne Guess, PA-C  potassium chloride SA (KLOR-CON M20) 20 MEQ tablet Take 1 tablet (20 mEq total) by mouth daily. 10/25/18   Hinda Kehr, MD  spironolactone (ALDACTONE) 25 MG tablet Take 25 mg by mouth daily.    [provider]  traMADol (ULTRAM) 50 MG tablet Take 0.5-1 tablets (25-50 mg total) by mouth every 8 (eight) hours as needed for severe pain. 04/27/17   Angiulli, Lavon Paganini, PA-C    Allergies Enalapril  No family history on file.  Social History Social History   Tobacco Use  . Smoking status: Never Smoker  . Smokeless tobacco: Never Used  Vaping Use  . Vaping Use: Never used  Substance Use Topics  . Alcohol  use: No  . Drug use: Never    Review of Systems  Constitutional: No fever/chills Eyes: No visual changes. ENT: No sore throat. Cardiovascular: Denies chest pain. Respiratory: Denies shortness of breath. Gastrointestinal: No abdominal pain.  No nausea, no vomiting.  No diarrhea. Positive for constipation. Genitourinary: Negative for dysuria. Musculoskeletal: Negative for back pain. Skin: Negative for rash. Neurological: Negative for headaches, focal weakness or numbness.   ____________________________________________   PHYSICAL EXAM:  VITAL SIGNS: ED Triage Vitals  Enc Vitals Group     BP 07/28/19 2331 (!) 153/85     Pulse Rate 07/28/19 2331 69     Resp 07/28/19 2331 17     Temp 07/28/19 2331 97.8 F (36.6 C)     Temp Source 07/28/19 2331 Oral     SpO2 07/28/19 2331 97 %     Weight 07/28/19 2332 (!) 330 lb (149.7 kg)     Height 07/28/19 2332 6\' 4"  (1.93 m)     Head Circumference --      Peak Flow --      Pain Score 07/28/19 2332 0     Pain Loc --      Pain Edu? --      Excl. in [place]? --     Constitutional: Alert and oriented. Well appearing and in no acute distress. Eyes: Conjunctivae are normal. PERRL.  EOMI. Head: Atraumatic. Nose: No congestion/rhinnorhea. Mouth/Throat: Mucous membranes are moist.  Oropharynx non-erythematous. Neck: No stridor.   Cardiovascular: Normal rate, regular rhythm. Grossly normal heart sounds.  Good peripheral circulation. Respiratory: Normal respiratory effort.  No retractions. Lungs CTAB. Gastrointestinal: Soft and nontender to light or deep palpation. No distention. No abdominal bruits. No CVA tenderness. Musculoskeletal: No lower extremity tenderness nor edema.  No joint effusions. Neurologic:  Normal speech and language. No gross focal neurologic deficits are appreciated. No gait instability. Skin:  Skin is warm, dry and intact. No rash noted. Psychiatric: Mood and affect are normal. Speech and behavior are normal.  ____________________________________________   LABS (all labs ordered are listed, but only abnormal results are displayed)  Labs Reviewed - No data to display ____________________________________________  EKG  None ____________________________________________  RADIOLOGY  ED MD interpretation: Moderate to large stool burden  Official radiology report(s): DG Abd Acute W/Chest  Result Date: 07/29/2019 CLINICAL DATA:  No bowel movement.  Constipation. EXAM: DG ABDOMEN ACUTE W/ 1V CHEST COMPARISON:  Chest x-ray dated December 28, 2018 FINDINGS: The bowel gas pattern is nonobstructive. There is a large amount of stool in the colon. There are subtle patchy airspace opacities bilaterally which have significantly improved from prior study. The heart size is stable. There is no pneumothorax. There is a chronic appearing deformity of the proximal left femur. IMPRESSION: 1. Above average stool burden. 2. Nonobstructive bowel gas pattern. 3. Subtle patchy airspace opacities bilaterally, significantly improved since prior chest x-ray dated 12/28/2018. Electronically Signed   By: Constance Holster M.D.   On: 07/29/2019 01:06     ____________________________________________   PROCEDURES  Procedure(s) performed (including Critical Care):  Procedures   ____________________________________________   INITIAL IMPRESSION / ASSESSMENT AND PLAN / ED COURSE  As part of my medical decision making, I reviewed the following data within the [place] notes reviewed and incorporated, Old chart reviewed, Radiograph reviewed and Notes from prior ED visits     Trevor [name] was evaluated in Emergency Department on 07/29/2019 for the symptoms described in the history of present illness. He was evaluated  in the context of the global COVID-19 pandemic, which necessitated consideration that the patient might be at risk for infection with the SARS-CoV-2 virus that causes COVID-19. Institutional protocols and algorithms that pertain to the evaluation of patients at risk for COVID-19 are in a state of rapid change based on information released by regulatory bodies including the CDC and federal and state organizations. These policies and algorithms were followed during the patient's care in the ED.    64 year old male presenting with constipation. No abdominal pain, nausea or vomiting. Self disimpacted some hard stool balls prior to arrival. Will place on Lactulose and recommend daily OTC bowel regimen. Strict return precautions given. Patient verbalizes understanding agrees with plan of care.      ____________________________________________   FINAL CLINICAL IMPRESSION(S) / ED DIAGNOSES  Final diagnoses:  Constipation, unspecified constipation type     ED Discharge Orders         Ordered    lactulose ([place]) 10 GM/15ML solution  Daily PRN     Discontinue  Reprint     07/29/19 0538           Note:  This document was prepared using Dragon voice recognition software and may include unintentional dictation errors.   Paulette Blanch, MD 07/29/19 [id number]

## 2019-07-29 NOTE — Discharge Instructions (Signed)
1. Take Lactulose as needed for bowel movements. 2. I recommend the following over-the-counter medications to regulate daily bowel movements: MiraLAX stool softener such as Colace fiber plenty of fluids 3. Return to the ER for worsening symptoms, persistent vomiting, difficulty breathing or other concerns.

## 2019-07-31 ENCOUNTER — Other Ambulatory Visit: Payer: Self-pay

## 2019-07-31 ENCOUNTER — Ambulatory Visit: Payer: Medicare Other

## 2019-07-31 DIAGNOSIS — R2689 Other abnormalities of gait and mobility: Secondary | ICD-10-CM

## 2019-07-31 DIAGNOSIS — M6281 Muscle weakness (generalized): Secondary | ICD-10-CM

## 2019-07-31 DIAGNOSIS — S88111S Complete traumatic amputation at level between knee and ankle, right lower leg, sequela: Secondary | ICD-10-CM

## 2019-07-31 DIAGNOSIS — R2681 Unsteadiness on feet: Secondary | ICD-10-CM

## 2019-07-31 NOTE — Therapy (Signed)
[place] MAIN [facility] SERVICES [address] [place], [place], [address] Phone: [phone]   Fax:  [phone]  Physical Therapy Treatment  Patient Details  Name: Trevor [name] MRN: [id number] Date of Birth: 16-May-1955 Referring Provider (PT): Jamse Arn, MD   Encounter Date: 07/31/2019   PT End of Session - 07/31/19 2127    Visit Number 32    Number of Visits 42    Date for PT Re-Evaluation 08/28/19    Authorization Type 2/10 PN 07/22/19    PT Start Time 1302    PT Stop Time 1345    PT Time Calculation (min) 43 min    Equipment Utilized During Treatment Gait belt;Other (comment)    Activity Tolerance Patient limited by fatigue;Patient tolerated treatment well    Behavior During Therapy [facility] for tasks assessed/performed           Past Medical History:  Diagnosis Date  . COVID-29 Dec 2018  . Diabetes mellitus without complication ([place])   . Heart murmur   . Hyperlipidemia   . Hypertension   . Sleep apnea     Past Surgical History:  Procedure Laterality Date  . AMPUTATION Right 03/31/2017   Procedure: RIGHT FOOT 1ST AND 2ND RAY AMPUTATION;  Surgeon: Newt Minion, MD;  Location: [place];  Service: Orthopedics;  Laterality: Right;  . AMPUTATION Right 04/01/2017   Procedure: AMPUTATION BELOW KNEE;  Surgeon: Newt Minion, MD;  Location: [place];  Service: Orthopedics;  Laterality: Right;  . COLONOSCOPY WITH PROPOFOL N/A 10/02/2017   Procedure: COLONOSCOPY WITH PROPOFOL;  Surgeon: [name], MD;  Location: [facility] ENDOSCOPY;  Service: Gastroenterology;  Laterality: N/A;  . CORNEAL TRANSPLANT      There were no vitals filed for this visit.   Subjective Assessment - 07/31/19 1336    Subjective Patient met with prosthetist friday and has some minor adjustements. Patient missed last session due to having an ED visit for constipation, Is feeling better now.    Pertinent History Patient returning to PT after multiple  hospitalizations for L foot and COVID-19. Received home health therapy till 02/14/19  . Patient last seen 12/13/2018 in this outpatient clinic. PMH includes DM, heart murmur, HLD, HTN, sleep apnea, cellulitis of L foot, hypomagnesemia, unilateral complete BKA R (2019), stage III CKD, PAD, legally blind, hypokalemia, AKI, and sepsis. Uses wheelchair primarily at home rather than walking with prosthesis.    Limitations Standing;Walking;House hold activities;Other (comment);Lifting    How long can you sit comfortably? hours /na/    How long can you stand comfortably? 15 minutes with UE support    How long can you walk comfortably? walked in the house    Patient Stated Goals walk better.    Currently in Pain? Yes    Pain Score 4     Pain Location Back    Pain Orientation Lower    Pain Descriptors / Indicators Aching    Pain Type Chronic pain    Pain Onset More than a month ago    Pain Frequency Intermittent                 Patient missed last session due to having an ED visit for constipation, Is feeling better now.       Vitals 138/76 pulse 69   Standing: close CGA with focus on stability ambulate >1000 ft with no rest breaks without SPC close CGA, cues for gait mechanics, arm swing, weight shift  and posture. Additional cues for negotiation of turns, people, and obstacles.    Side stepping 4x length of 20 ft no AD ; close CGA due to occasional hip drop/fatigue   Standing wall marches with BUE against wall for core activation 15x each LE  Wall lunge stretch 2x 30 second holds with BUE against wall   Long sitting/reclined on Plinth table:   Adduction rainbow ball squeeze 12x 3 second holds Swiss ball TrA contraction 12x 3 second hold pressing into hands/knees  Long arc quad combined with rainbow half circle over BTB 15x each LE for in/ext rotation with quad activation    modified figure 4 stretch with PT overpressure 30 second holds          Pt educated throughout session  about proper posture and technique with exercises. Improved exercise technique, movement at target joints, use of target muscles after min to mod verbal, visual, tactile cues.               PT Education - 07/31/19 2126    Education Details exercise technique, body mechanics    Person(s) Educated Patient    Methods Explanation;Demonstration;Tactile cues;Verbal cues    Comprehension Verbalized understanding;Returned demonstration;Verbal cues required;Tactile cues required            PT Short Term Goals - 07/03/19 1310      PT SHORT TERM GOAL #1   Title Patient will be independent in home exercise program to improve strength/mobility for better functional independence with ADLs.    Baseline 3/3 HEP next session 4/12 HEP compliant 4/28: compliant; 5/24: HEP compliant 6/23 HEp compliant    Time 2    Period Weeks    Status Achieved      PT SHORT TERM GOAL #2   Title Patient will ambulate for 6 minutes without seated rest break to increase tolerance for 6 minute walk test    Baseline 3/3: only able to ambulate 3 minutes and 59 seconds prior to rest break 4/12 6 min walk test deferred till next session 4/14: 6 minutes with one rest break 5/24: 10 seconds from full 6 minutes 6/23: unable to perform this session    Time 2    Period Weeks    Status On-going    Target Date 07/17/19             PT Long Term Goals - 07/03/19 1357      PT LONG TERM GOAL #1   Title Patient will increase six minute walk test distance to >1000 with LRAD for progression to community ambulator and improve gait ability    Baseline 3/3: 700 ft, terminated early 4/12: deferred due to walking down to PT gym 4/14: 740 ft 6 minutes with one seated rest break 5/24: 800 ft with [facility] 6/23: 600 ft terminated due to back pain    Time 8    Period Weeks    Status On-going    Target Date 08/28/19      PT LONG TERM GOAL #2   Title Patient will increase FOTO score to equal to or greater than 57/100 to demonstrate  statistically significant improved mobility and quality of life.    Baseline 3/3: 47/100 risk adjusted 43/100 4/12: 41/100 4/28: 50/100 5/24: 46% 6/23: 47%    Time 8    Period Weeks    Status Partially Met    Target Date 08/28/19      PT LONG TERM GOAL #3   Title Patient will increase Oceanographer  score by > 6 points (47/56) to demonstrate decreased fall risk during functional activities.    Baseline 3/3: 41/56 4/12; 49/56    Time 8    Period Weeks    Status Achieved      PT LONG TERM GOAL #4   Title Patient will increase BLE gross strength to 4+/5 as to improve functional strength for independent gait, increased standing tolerance and increased ADL ability.    Baseline 3/3: LLE grossly 4-/5 R grossly 3+ hip 4- knee 4/12: LLE grossly 4- to 4/5 with R 4- hip 6/23: did not tolerate resistance due to back pain    Time 8    Period Weeks    Status Partially Met    Target Date 08/28/19      PT LONG TERM GOAL #5   Title Patient will increase dynamic gait index score to >19/24 as to demonstrate reduced fall risk and improved dynamic gait balance for better safety with community/home ambulation.    Baseline 4/28: 14/24 5/24: 17/24 6/24: 20/24    Time 8    Period Weeks    Status Achieved      PT LONG TERM GOAL #6   Title Patient will be mod I for negotiating curb with LRAD while wearing prosthetic for safe community ambulation.    Baseline needs assistance at this time    Time 8    Period Weeks    Status New    Target Date 08/29/19                 Plan - 07/31/19 2128    Clinical Impression Statement Patient is more fatigued this session requiring more frequent rest breaks and seated interventions between standing ones for task tolerance. Long sitting interventions tolerated well for back pain. The patient would benefit from further skilled PT intervention to maximize QOL, safety, and functional mobility    Personal Factors and Comorbidities Age;Comorbidity  3+;Finances;Fitness;Past/Current Experience;Social Background;Time since onset of injury/illness/exacerbation;Transportation    Comorbidities DM, heart murmur, HLD, HTN, sleep apnea, cellulitis of L foot, hypomagnesemia, unilateral complete BKA R (2019), stage III CKD, PAD, legally blind, hypokalemia, AKI, and sepsis    Examination-Activity Limitations Bathing;Bed Mobility;Caring for [employer];Locomotion Level;Lift;Stand;Toileting;Transfers    Examination-Participation Restrictions Church;Cleaning;Community Activity;Driving;Interpersonal Relationship;Laundry;Shop;Meal Prep;Yard 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 complexity    Rehab Potential Fair    PT Frequency 2x / week    PT Duration 8 weeks    PT Treatment/Interventions ADLs/Self Care Home Management;Cryotherapy;Electrical Stimulation;Ultrasound;Traction;Moist Heat;DME Instruction;Gait training;Stair training;Functional mobility training;Therapeutic activities;Therapeutic exercise;Patient/family education;Neuromuscular re-education;Balance training;Prosthetic Training;Wheelchair mobility training;Manual techniques;Manual lymph drainage;Compression bandaging;Taping;Energy conservation;Passive range of motion;Aquatic Therapy;Vestibular    PT Next Visit Plan walk on beach    Consulted and Agree with Plan of Care Patient           Patient will benefit from skilled therapeutic intervention in order to improve the following deficits and impairments:  Abnormal gait, Decreased activity tolerance, Decreased balance, Decreased knowledge of precautions, Decreased endurance, Decreased knowledge of use of DME, Decreased mobility, Decreased range of motion, Difficulty walking, Decreased strength, Increased edema, Impaired flexibility, Impaired perceived functional ability, Prosthetic Dependency, Postural dysfunction, Improper body mechanics, Pain, Decreased skin integrity, Impaired  vision/preception, Impaired sensation  Visit Diagnosis: Muscle weakness (generalized)  Unsteadiness on feet  Other abnormalities of gait and mobility  Unilateral complete BKA, right, sequela ([place])     Problem List Patient Active Problem List   Diagnosis Date Noted  . Elevated lactic acid level   .  Diabetic ketoacidosis without coma associated with type 2 diabetes mellitus ([place])   . Cellulitis of left foot 12/17/2018  . Pain due to onychomycosis of toenail of right foot 07/05/2018  . Abnormality of gait 10/12/2017  . Type 2 diabetes mellitus with hyperglycemia, with long-term current use of insulin ([place])   . Flatulence   . Hypomagnesemia   . Unilateral complete BKA, right, sequela ([place])   . Benign essential HTN   . Hypoalbuminemia due to protein-calorie malnutrition ([place])   . S/P below knee amputation, right ([place]) 04/12/2017  . Acute blood loss anemia   . Other encephalopathy 04/08/2017  . Encephalopathy 04/08/2017  . Altered mental status 04/08/2017  . Labile blood pressure   . Labile blood glucose   . Drug induced constipation   . Stage 3 chronic kidney disease   . Bacteremia   . S/P unilateral BKA (below knee amputation), right ([place]) 04/04/2017  . PAD (peripheral artery disease) ([place])   . Type 2 diabetes mellitus with right diabetic foot ulcer ([place])   . Post-operative pain   . Legally blind   . Upper GI bleed   . Streptococcal bacteremia 04/01/2017  . Hypokalemia 03/30/2017  . Uncontrolled type 2 diabetes mellitus with hyperglycemia, with long-term current use of insulin ([place]) 03/30/2017  . Type 2 diabetes mellitus with peripheral neuropathy ([place]) 03/30/2017  . AKI (acute kidney injury) ([place]) 03/30/2017  . CKD stage 3 due to type 2 diabetes mellitus ([place]) 03/30/2017  . Sepsis ([place]) 03/30/2017  . Heart murmur 11/22/2016  . Hyperlipidemia associated with type 2 diabetes mellitus ([place]) 11/22/2016  . Obstructive sleep apnea syndrome 11/22/2016  . Essential  hypertension 12/17/2012   Janna Arch, PT, DPT   07/31/2019, 9:29 PM  [place] MAIN [facility] SERVICES [address] [place], [place], [address] Phone: [phone]   Fax:  [phone]  Name: Trevor [name] MRN: [id number] Date of Birth: August 25, 1955

## 2019-08-04 IMAGING — DX DG CHEST 1V PORT
1 series · 1 of 1 positions shown · non-contrast
Comparison: None.

CLINICAL DATA: Fever

EXAM:
PORTABLE CHEST 1 VIEW

[chest ap]
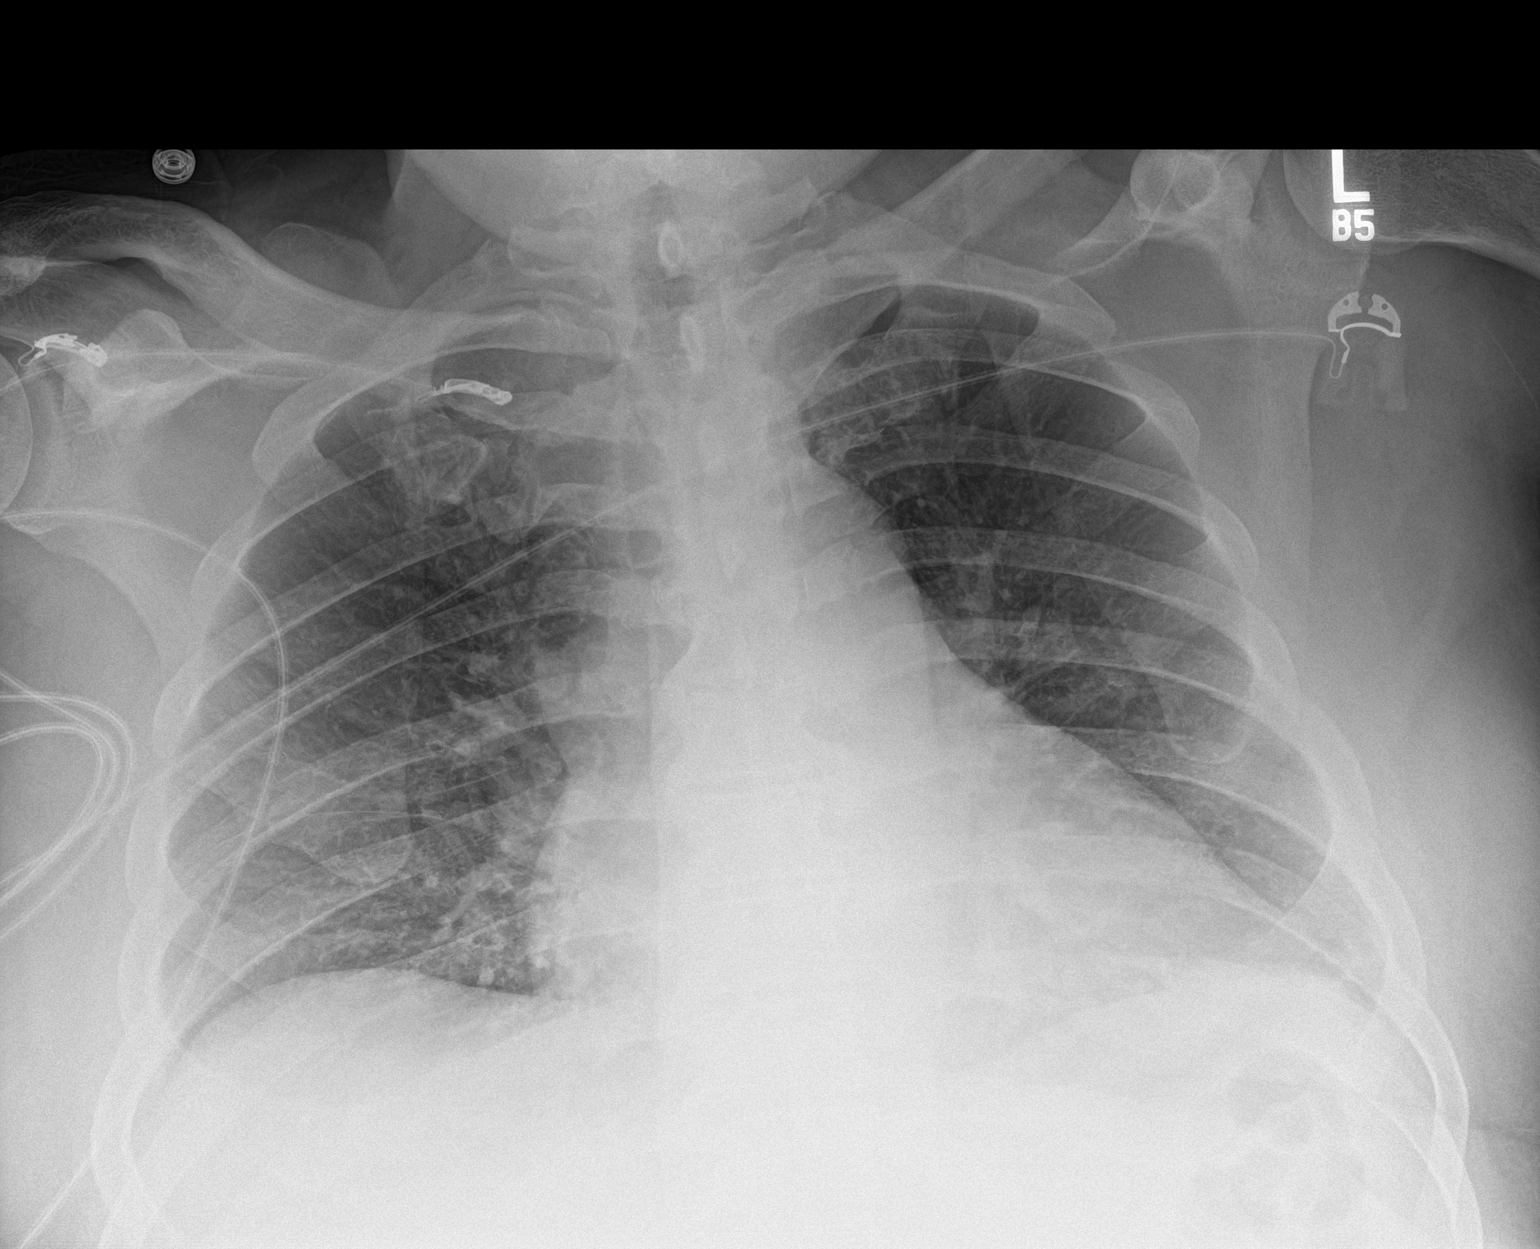

[1 of 1 positions shown; findings below may reference images not displayed]

FINDINGS: Mildly low lung volumes with atelectasis at the left base. No focal
consolidation. Heart size upper normal. No pneumothorax.
IMPRESSION: Low lung volumes with minimal atelectasis at the left base.

## 2019-08-05 ENCOUNTER — Ambulatory Visit: Payer: Medicare Other

## 2019-08-07 ENCOUNTER — Other Ambulatory Visit: Payer: Self-pay

## 2019-08-07 ENCOUNTER — Ambulatory Visit: Payer: Medicare Other

## 2019-08-07 DIAGNOSIS — R2681 Unsteadiness on feet: Secondary | ICD-10-CM

## 2019-08-07 DIAGNOSIS — R2689 Other abnormalities of gait and mobility: Secondary | ICD-10-CM

## 2019-08-07 DIAGNOSIS — S88111S Complete traumatic amputation at level between knee and ankle, right lower leg, sequela: Secondary | ICD-10-CM

## 2019-08-07 DIAGNOSIS — M6281 Muscle weakness (generalized): Secondary | ICD-10-CM | POA: Diagnosis not present

## 2019-08-07 NOTE — Therapy (Signed)
[place] MAIN [facility] SERVICES [address] [place], [place], [address] Phone: [phone]   Fax:  [phone]  Physical Therapy Treatment  Patient Details  Name: Trevor [name] MRN: [id number] Date of Birth: 29-Nov-1955 Referring Provider (PT): Jamse Arn, MD   Encounter Date: 08/07/2019   PT End of Session - 08/08/19 0901    Visit Number 33    Number of Visits 42    Date for PT Re-Evaluation 08/28/19    Authorization Type 3/10 PN 07/22/19    PT Start Time 1300    PT Stop Time 1344    PT Time Calculation (min) 44 min    Equipment Utilized During Treatment Gait belt;Other (comment)    Activity Tolerance Patient limited by fatigue;Patient tolerated treatment well    Behavior During Therapy [facility] for tasks assessed/performed           Past Medical History:  Diagnosis Date  . COVID-29 Dec 2018  . Diabetes mellitus without complication ([place])   . Heart murmur   . Hyperlipidemia   . Hypertension   . Sleep apnea     Past Surgical History:  Procedure Laterality Date  . AMPUTATION Right 03/31/2017   Procedure: RIGHT FOOT 1ST AND 2ND RAY AMPUTATION;  Surgeon: Newt Minion, MD;  Location: [place];  Service: Orthopedics;  Laterality: Right;  . AMPUTATION Right 04/01/2017   Procedure: AMPUTATION BELOW KNEE;  Surgeon: Newt Minion, MD;  Location: [place];  Service: Orthopedics;  Laterality: Right;  . COLONOSCOPY WITH PROPOFOL N/A 10/02/2017   Procedure: COLONOSCOPY WITH PROPOFOL;  Surgeon: [name], MD;  Location: [facility] ENDOSCOPY;  Service: Gastroenterology;  Laterality: N/A;  . CORNEAL TRANSPLANT      There were no vitals filed for this visit.   Subjective Assessment - 08/07/19 1336    Subjective Patient missed last session due to having to go to a funeral.  Was able to walk with a cane to/from service.    Pertinent History Patient returning to PT after multiple hospitalizations for L foot and COVID-19. Received home health  therapy till 02/14/19  . Patient last seen 12/13/2018 in this outpatient clinic. PMH includes DM, heart murmur, HLD, HTN, sleep apnea, cellulitis of L foot, hypomagnesemia, unilateral complete BKA R (2019), stage III CKD, PAD, legally blind, hypokalemia, AKI, and sepsis. Uses wheelchair primarily at home rather than walking with prosthesis.    Limitations Standing;Walking;House hold activities;Other (comment);Lifting    How long can you sit comfortably? hours /na/    How long can you stand comfortably? 15 minutes with UE support    How long can you walk comfortably? walked in the house    Patient Stated Goals walk better.    Currently in Pain? Yes    Pain Score 4     Pain Location Back    Pain Orientation Lower    Pain Descriptors / Indicators Aching    Pain Type Chronic pain    Pain Onset More than a month ago    Pain Frequency Intermittent                  Vitals 134/73 pulse 64     Standing: close CGA with focus on stability ambulate >1000 ft with no rest breaks without SPC close CGA, cues for gait mechanics, arm swing, weight shift and posture. Additional cues for negotiation of turns, people, and obstacles.  Second trial of >800 ft with SPC with seated rest break.  Wall lunge stretch 2x 30 second holds with BUE against wall    Long sitting/reclined on Plinth table:   5lb ankle weight: -high knee march 12x each LE; cues for keeping feet apart -alternating LAQ with opp UE raise -alternating ER/IR with green ball squeeze between knees 12x each LE   Check bottom of L foot for assessment of plantar surface, photos taken with patient's phone for him to see bottom of foot himself     Pt educated throughout session about proper posture and technique with exercises. Improved exercise technique, movement at target joints, use of target muscles after min to mod verbal, visual, tactile cues.                      PT Education - 08/07/19 1337    Education Details  exercise technique, body mechanics    Person(s) Educated Patient    Methods Explanation;Demonstration;Tactile cues;Verbal cues    Comprehension Verbalized understanding;Returned demonstration;Verbal cues required;Tactile cues required            PT Short Term Goals - 07/03/19 1310      PT SHORT TERM GOAL #1   Title Patient will be independent in home exercise program to improve strength/mobility for better functional independence with ADLs.    Baseline 3/3 HEP next session 4/12 HEP compliant 4/28: compliant; 5/24: HEP compliant 6/23 HEp compliant    Time 2    Period Weeks    Status Achieved      PT SHORT TERM GOAL #2   Title Patient will ambulate for 6 minutes without seated rest break to increase tolerance for 6 minute walk test    Baseline 3/3: only able to ambulate 3 minutes and 59 seconds prior to rest break 4/12 6 min walk test deferred till next session 4/14: 6 minutes with one rest break 5/24: 10 seconds from full 6 minutes 6/23: unable to perform this session    Time 2    Period Weeks    Status On-going    Target Date 07/17/19             PT Long Term Goals - 07/03/19 1357      PT LONG TERM GOAL #1   Title Patient will increase six minute walk test distance to >1000 with LRAD for progression to community ambulator and improve gait ability    Baseline 3/3: 700 ft, terminated early 4/12: deferred due to walking down to PT gym 4/14: 740 ft 6 minutes with one seated rest break 5/24: 800 ft with [facility] 6/23: 600 ft terminated due to back pain    Time 8    Period Weeks    Status On-going    Target Date 08/28/19      PT LONG TERM GOAL #2   Title Patient will increase FOTO score to equal to or greater than 57/100 to demonstrate statistically significant improved mobility and quality of life.    Baseline 3/3: 47/100 risk adjusted 43/100 4/12: 41/100 4/28: 50/100 5/24: 46% 6/23: 47%    Time 8    Period Weeks    Status Partially Met    Target Date 08/28/19      PT LONG TERM  GOAL #3   Title Patient will increase Berg Balance score by > 6 points (47/56) to demonstrate decreased fall risk during functional activities.    Baseline 3/3: 41/56 4/12; 49/56    Time 8    Period Weeks    Status Achieved  PT LONG TERM GOAL #4   Title Patient will increase BLE gross strength to 4+/5 as to improve functional strength for independent gait, increased standing tolerance and increased ADL ability.    Baseline 3/3: LLE grossly 4-/5 R grossly 3+ hip 4- knee 4/12: LLE grossly 4- to 4/5 with R 4- hip 6/23: did not tolerate resistance due to back pain    Time 8    Period Weeks    Status Partially Met    Target Date 08/28/19      PT LONG TERM GOAL #5   Title Patient will increase dynamic gait index score to >19/24 as to demonstrate reduced fall risk and improved dynamic gait balance for better safety with community/home ambulation.    Baseline 4/28: 14/24 5/24: 17/24 6/24: 20/24    Time 8    Period Weeks    Status Achieved      PT LONG TERM GOAL #6   Title Patient will be mod I for negotiating curb with LRAD while wearing prosthetic for safe community ambulation.    Baseline needs assistance at this time    Time 8    Period Weeks    Status New    Target Date 08/29/19                 Plan - 08/08/19 0902    Clinical Impression Statement Patient is progressing with capacity for prolonged mobility performing two sets of complete laps around downstairs rehab department. Patient does continue to require seated rest breaks with prolonged ambulation however does tolerate increase in duration. The patient would benefit from further skilled PT intervention to maximize QOL, safety, and functional mobility    Personal Factors and Comorbidities Age;Comorbidity 3+;Finances;Fitness;Past/Current Experience;Social Background;Time since onset of injury/illness/exacerbation;Transportation    Comorbidities DM, heart murmur, HLD, HTN, sleep apnea, cellulitis of L foot,  hypomagnesemia, unilateral complete BKA R (2019), stage III CKD, PAD, legally blind, hypokalemia, AKI, and sepsis    Examination-Activity Limitations Bathing;Bed Mobility;Caring for [employer];Locomotion Level;Lift;Stand;Toileting;Transfers    Examination-Participation Restrictions Church;Cleaning;Community Activity;Driving;Interpersonal Relationship;Laundry;Shop;Meal Prep;Yard 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 complexity    Rehab Potential Fair    PT Frequency 2x / week    PT Duration 8 weeks    PT Treatment/Interventions ADLs/Self Care Home Management;Cryotherapy;Electrical Stimulation;Ultrasound;Traction;Moist Heat;DME Instruction;Gait training;Stair training;Functional mobility training;Therapeutic activities;Therapeutic exercise;Patient/family education;Neuromuscular re-education;Balance training;Prosthetic Training;Wheelchair mobility training;Manual techniques;Manual lymph drainage;Compression bandaging;Taping;Energy conservation;Passive range of motion;Aquatic Therapy;Vestibular    PT Next Visit Plan walk on beach    Consulted and Agree with Plan of Care Patient           Patient will benefit from skilled therapeutic intervention in order to improve the following deficits and impairments:  Abnormal gait, Decreased activity tolerance, Decreased balance, Decreased knowledge of precautions, Decreased endurance, Decreased knowledge of use of DME, Decreased mobility, Decreased range of motion, Difficulty walking, Decreased strength, Increased edema, Impaired flexibility, Impaired perceived functional ability, Prosthetic Dependency, Postural dysfunction, Improper body mechanics, Pain, Decreased skin integrity, Impaired vision/preception, Impaired sensation  Visit Diagnosis: Muscle weakness (generalized)  Unsteadiness on feet  Other abnormalities of gait and mobility  Unilateral complete BKA, right, sequela  ([place])     Problem List Patient Active Problem List   Diagnosis Date Noted  . Elevated lactic acid level   . Diabetic ketoacidosis without coma associated with type 2 diabetes mellitus ([place])   . Cellulitis of left foot 12/17/2018  . Pain due to onychomycosis of toenail of right foot 07/05/2018  . Abnormality  of gait 10/12/2017  . Type 2 diabetes mellitus with hyperglycemia, with long-term current use of insulin ([place])   . Flatulence   . Hypomagnesemia   . Unilateral complete BKA, right, sequela ([place])   . Benign essential HTN   . Hypoalbuminemia due to protein-calorie malnutrition ([place])   . S/P below knee amputation, right ([place]) 04/12/2017  . Acute blood loss anemia   . Other encephalopathy 04/08/2017  . Encephalopathy 04/08/2017  . Altered mental status 04/08/2017  . Labile blood pressure   . Labile blood glucose   . Drug induced constipation   . Stage 3 chronic kidney disease   . Bacteremia   . S/P unilateral BKA (below knee amputation), right ([place]) 04/04/2017  . PAD (peripheral artery disease) ([place])   . Type 2 diabetes mellitus with right diabetic foot ulcer ([place])   . Post-operative pain   . Legally blind   . Upper GI bleed   . Streptococcal bacteremia 04/01/2017  . Hypokalemia 03/30/2017  . Uncontrolled type 2 diabetes mellitus with hyperglycemia, with long-term current use of insulin ([place]) 03/30/2017  . Type 2 diabetes mellitus with peripheral neuropathy ([place]) 03/30/2017  . AKI (acute kidney injury) ([place]) 03/30/2017  . CKD stage 3 due to type 2 diabetes mellitus ([place]) 03/30/2017  . Sepsis ([place]) 03/30/2017  . Heart murmur 11/22/2016  . Hyperlipidemia associated with type 2 diabetes mellitus ([place]) 11/22/2016  . Obstructive sleep apnea syndrome 11/22/2016  . Essential hypertension 12/17/2012   Janna Arch, PT, DPT   08/08/2019, 9:04 AM  [place] MAIN [facility] SERVICES [address] [place], [place], [address] Phone: [phone]    Fax:  [phone]  Name: Trevor [name] MRN: [id number] Date of Birth: March 09, 1955

## 2019-08-12 ENCOUNTER — Ambulatory Visit: Payer: Medicare Other | Attending: Physical Medicine & Rehabilitation

## 2019-08-12 ENCOUNTER — Other Ambulatory Visit: Payer: Self-pay

## 2019-08-12 DIAGNOSIS — R2689 Other abnormalities of gait and mobility: Secondary | ICD-10-CM | POA: Diagnosis present

## 2019-08-12 DIAGNOSIS — M6281 Muscle weakness (generalized): Secondary | ICD-10-CM | POA: Insufficient documentation

## 2019-08-12 DIAGNOSIS — S88111S Complete traumatic amputation at level between knee and ankle, right lower leg, sequela: Secondary | ICD-10-CM | POA: Diagnosis present

## 2019-08-12 DIAGNOSIS — R5383 Other fatigue: Secondary | ICD-10-CM | POA: Insufficient documentation

## 2019-08-12 DIAGNOSIS — R2681 Unsteadiness on feet: Secondary | ICD-10-CM | POA: Diagnosis present

## 2019-08-12 NOTE — Therapy (Signed)
[place] MAIN [facility] SERVICES [address] [place], [place], [address] Phone: [phone]   Fax:  [phone]  Physical Therapy Treatment  Patient Details  Name: Trevor [name] MRN: [id number] Date of Birth: May 16, 1955 Referring Provider (PT): Jamse Arn, MD   Encounter Date: 08/12/2019   PT End of Session - 08/12/19 1550    Visit Number 34    Number of Visits 42    Date for PT Re-Evaluation 08/28/19    Authorization Type 4/10 PN 07/22/19    PT Start Time 1304    PT Stop Time 1344    PT Time Calculation (min) 40 min    Equipment Utilized During Treatment Gait belt;Other (comment)    Activity Tolerance Patient limited by fatigue;Patient tolerated treatment well    Behavior During Therapy [facility] for tasks assessed/performed           Past Medical History:  Diagnosis Date   COVID-29 Dec 2018   Diabetes mellitus without complication ([place])    Heart murmur    Hyperlipidemia    Hypertension    Sleep apnea     Past Surgical History:  Procedure Laterality Date   AMPUTATION Right 03/31/2017   Procedure: RIGHT FOOT 1ST AND 2ND RAY AMPUTATION;  Surgeon: Newt Minion, MD;  Location: [place];  Service: Orthopedics;  Laterality: Right;   AMPUTATION Right 04/01/2017   Procedure: AMPUTATION BELOW KNEE;  Surgeon: Newt Minion, MD;  Location: [place];  Service: Orthopedics;  Laterality: Right;   COLONOSCOPY WITH PROPOFOL N/A 10/02/2017   Procedure: COLONOSCOPY WITH PROPOFOL;  Surgeon: [name], MD;  Location: [facility] ENDOSCOPY;  Service: Gastroenterology;  Laterality: N/A;   CORNEAL TRANSPLANT      There were no vitals filed for this visit.   Subjective Assessment - 08/12/19 1549    Subjective Patient reports compliance with HEP. No falls or LOB since last session. Has been constipated increasing his back pain.    Pertinent History Patient returning to PT after multiple hospitalizations for L foot and COVID-19. Received home  health therapy till 02/14/19  . Patient last seen 12/13/2018 in this outpatient clinic. PMH includes DM, heart murmur, HLD, HTN, sleep apnea, cellulitis of L foot, hypomagnesemia, unilateral complete BKA R (2019), stage III CKD, PAD, legally blind, hypokalemia, AKI, and sepsis. Uses wheelchair primarily at home rather than walking with prosthesis.    Limitations Standing;Walking;House hold activities;Other (comment);Lifting    How long can you sit comfortably? hours /na/    How long can you stand comfortably? 15 minutes with UE support    How long can you walk comfortably? walked in the house    Patient Stated Goals walk better.    Currently in Pain? Yes    Pain Score 4     Pain Location Back    Pain Orientation Lower    Pain Descriptors / Indicators Aching    Pain Type Chronic pain    Pain Onset More than a month ago    Pain Frequency Intermittent            Vitals: 123/70 pulse 67      Standing: close CGA with focus on stability ambulate >1000 ft with no rest breaks without SPC close CGA, cues for gait mechanics, arm swing, weight shift and posture. Additional cues for negotiation of turns, people, and obstacles.     Ambulate w SPC looking for cones with lateral and vertical head movements/scans x 96 ft.  Wall lunge stretch 2x 30 second holds with BUE against wall   Next to support surface:  -5lb ankle weight:   -6" step no UE support toe taps 12x each LE  -hip extension 12x each LE, focus on anterior hip lengthening -half bosu ball lateral modified lunge BUE support 10x each side -modified single limb stance 20 second holds 2x each LE with BUE support        Pt educated throughout session about proper posture and technique with exercises. Improved exercise technique, movement at target joints, use of target muscles after min to mod verbal, visual, tactile cues.                          PT Education - 08/12/19 1550    Education Details exercise  technique, weight acceptance, gait mobility    Person(s) Educated Patient    Methods Explanation;Demonstration;Tactile cues;Verbal cues    Comprehension Verbalized understanding;Verbal cues required;Tactile cues required;Returned demonstration            PT Short Term Goals - 07/03/19 1310      PT SHORT TERM GOAL #1   Title Patient will be independent in home exercise program to improve strength/mobility for better functional independence with ADLs.    Baseline 3/3 HEP next session 4/12 HEP compliant 4/28: compliant; 5/24: HEP compliant 6/23 HEp compliant    Time 2    Period Weeks    Status Achieved      PT SHORT TERM GOAL #2   Title Patient will ambulate for 6 minutes without seated rest break to increase tolerance for 6 minute walk test    Baseline 3/3: only able to ambulate 3 minutes and 59 seconds prior to rest break 4/12 6 min walk test deferred till next session 4/14: 6 minutes with one rest break 5/24: 10 seconds from full 6 minutes 6/23: unable to perform this session    Time 2    Period Weeks    Status On-going    Target Date 07/17/19             PT Long Term Goals - 07/03/19 1357      PT LONG TERM GOAL #1   Title Patient will increase six minute walk test distance to >1000 with LRAD for progression to community ambulator and improve gait ability    Baseline 3/3: 700 ft, terminated early 4/12: deferred due to walking down to PT gym 4/14: 740 ft 6 minutes with one seated rest break 5/24: 800 ft with [facility] 6/23: 600 ft terminated due to back pain    Time 8    Period Weeks    Status On-going    Target Date 08/28/19      PT LONG TERM GOAL #2   Title Patient will increase FOTO score to equal to or greater than 57/100 to demonstrate statistically significant improved mobility and quality of life.    Baseline 3/3: 47/100 risk adjusted 43/100 4/12: 41/100 4/28: 50/100 5/24: 46% 6/23: 47%    Time 8    Period Weeks    Status Partially Met    Target Date 08/28/19      PT  LONG TERM GOAL #3   Title Patient will increase Berg Balance score by > 6 points (47/56) to demonstrate decreased fall risk during functional activities.    Baseline 3/3: 41/56 4/12; 49/56    Time 8    Period Weeks    Status Achieved  PT LONG TERM GOAL #4   Title Patient will increase BLE gross strength to 4+/5 as to improve functional strength for independent gait, increased standing tolerance and increased ADL ability.    Baseline 3/3: LLE grossly 4-/5 R grossly 3+ hip 4- knee 4/12: LLE grossly 4- to 4/5 with R 4- hip 6/23: did not tolerate resistance due to back pain    Time 8    Period Weeks    Status Partially Met    Target Date 08/28/19      PT LONG TERM GOAL #5   Title Patient will increase dynamic gait index score to >19/24 as to demonstrate reduced fall risk and improved dynamic gait balance for better safety with community/home ambulation.    Baseline 4/28: 14/24 5/24: 17/24 6/24: 20/24    Time 8    Period Weeks    Status Achieved      PT LONG TERM GOAL #6   Title Patient will be mod I for negotiating curb with LRAD while wearing prosthetic for safe community ambulation.    Baseline needs assistance at this time    Time 8    Period Weeks    Status New    Target Date 08/29/19                 Plan - 08/12/19 1551    Clinical Impression Statement Patient is progressing with functional mobility with decreased UE support required throughout session. Patient continues to be challenged with prolonged strengthening/balance interventions with pain in low back requiring occasional seated rest breaks. The patient would benefit from further skilled PT intervention to maximize QOL, safety, and functional mobility    Personal Factors and Comorbidities Age;Comorbidity 3+;Finances;Fitness;Past/Current Experience;Social Background;Time since onset of injury/illness/exacerbation;Transportation    Comorbidities DM, heart murmur, HLD, HTN, sleep apnea, cellulitis of L foot,  hypomagnesemia, unilateral complete BKA R (2019), stage III CKD, PAD, legally blind, hypokalemia, AKI, and sepsis    Examination-Activity Limitations Bathing;Bed Mobility;Caring for [employer];Locomotion Level;Lift;Stand;Toileting;Transfers    Examination-Participation Restrictions Church;Cleaning;Community Activity;Driving;Interpersonal Relationship;Laundry;Shop;Meal Prep;Yard 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 complexity    Rehab Potential Fair    PT Frequency 2x / week    PT Duration 8 weeks    PT Treatment/Interventions ADLs/Self Care Home Management;Cryotherapy;Electrical Stimulation;Ultrasound;Traction;Moist Heat;DME Instruction;Gait training;Stair training;Functional mobility training;Therapeutic activities;Therapeutic exercise;Patient/family education;Neuromuscular re-education;Balance training;Prosthetic Training;Wheelchair mobility training;Manual techniques;Manual lymph drainage;Compression bandaging;Taping;Energy conservation;Passive range of motion;Aquatic Therapy;Vestibular    PT Next Visit Plan walk on beach    Consulted and Agree with Plan of Care Patient           Patient will benefit from skilled therapeutic intervention in order to improve the following deficits and impairments:  Abnormal gait, Decreased activity tolerance, Decreased balance, Decreased knowledge of precautions, Decreased endurance, Decreased knowledge of use of DME, Decreased mobility, Decreased range of motion, Difficulty walking, Decreased strength, Increased edema, Impaired flexibility, Impaired perceived functional ability, Prosthetic Dependency, Postural dysfunction, Improper body mechanics, Pain, Decreased skin integrity, Impaired vision/preception, Impaired sensation  Visit Diagnosis: Muscle weakness (generalized)  Unsteadiness on feet  Other abnormalities of gait and mobility  Unilateral complete BKA, right, sequela  (HCC)     Problem List Patient Active Problem List   Diagnosis Date Noted   Elevated lactic acid level    Diabetic ketoacidosis without coma associated with type 2 diabetes mellitus (HCC)    Cellulitis of left foot 12/17/2018   Pain due to onychomycosis of toenail of right foot 07/05/2018   Abnormality of gait 10/12/2017  Type 2 diabetes mellitus with hyperglycemia, with long-term current use of insulin (HCC)    Flatulence    Hypomagnesemia    Unilateral complete BKA, right, sequela (HCC)    Benign essential HTN    Hypoalbuminemia due to protein-calorie malnutrition (HCC)    S/P below knee amputation, right ([place]) 04/12/2017   Acute blood loss anemia    Other encephalopathy 04/08/2017   Encephalopathy 04/08/2017   Altered mental status 04/08/2017   Labile blood pressure    Labile blood glucose    Drug induced constipation    Stage 3 chronic kidney disease    Bacteremia    S/P unilateral BKA (below knee amputation), right ([place]) 04/04/2017   PAD (peripheral artery disease) ([place])    Type 2 diabetes mellitus with right diabetic foot ulcer ([place])    Post-operative pain    Legally blind    Upper GI bleed    Streptococcal bacteremia 04/01/2017   Hypokalemia 03/30/2017   Uncontrolled type 2 diabetes mellitus with hyperglycemia, with long-term current use of insulin ([place]) 03/30/2017   Type 2 diabetes mellitus with peripheral neuropathy ([place]) 03/30/2017   AKI (acute kidney injury) ([place]) 03/30/2017   CKD stage 3 due to type 2 diabetes mellitus ([place]) 03/30/2017   Sepsis ([place]) 03/30/2017   Heart murmur 11/22/2016   Hyperlipidemia associated with type 2 diabetes mellitus ([place]) 11/22/2016   Obstructive sleep apnea syndrome 11/22/2016   Essential hypertension 12/17/2012   Janna Arch, PT, DPT   08/12/2019, 3:52 PM  [place] MAIN [facility] SERVICES [address] [place], [place], [address] Phone: [phone]    Fax:  [phone]  Name: Trevor [name] MRN: [id number] Date of Birth: 11/28/1955

## 2019-08-14 ENCOUNTER — Other Ambulatory Visit: Payer: Self-pay

## 2019-08-14 ENCOUNTER — Ambulatory Visit: Payer: Medicare Other

## 2019-08-14 DIAGNOSIS — S88111S Complete traumatic amputation at level between knee and ankle, right lower leg, sequela: Secondary | ICD-10-CM

## 2019-08-14 DIAGNOSIS — R2689 Other abnormalities of gait and mobility: Secondary | ICD-10-CM

## 2019-08-14 DIAGNOSIS — M6281 Muscle weakness (generalized): Secondary | ICD-10-CM | POA: Diagnosis not present

## 2019-08-14 DIAGNOSIS — R2681 Unsteadiness on feet: Secondary | ICD-10-CM

## 2019-08-14 IMAGING — CT CT HEAD W/O CM
4 series · 15 of 47 positions shown, 17 images · non-contrast
Comparison: None.

CLINICAL DATA: Acute confusion.

EXAM:
CT HEAD WITHOUT CONTRAST
TECHNIQUE: Contiguous axial images were obtained from the base of the skull
through the vertex without intravenous contrast.

[Series 3: head without · axial · non-contrast · 0.44mm/px · z∈[-230,-104]mm · 7 of 35 slices shown, 9 images]
[im 5/35  brain]
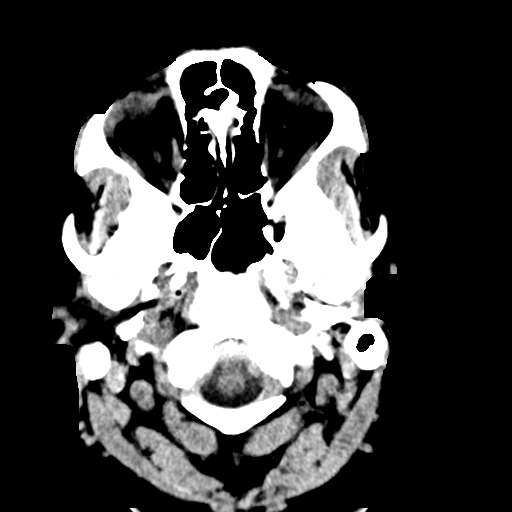
[im 5/35  bone]
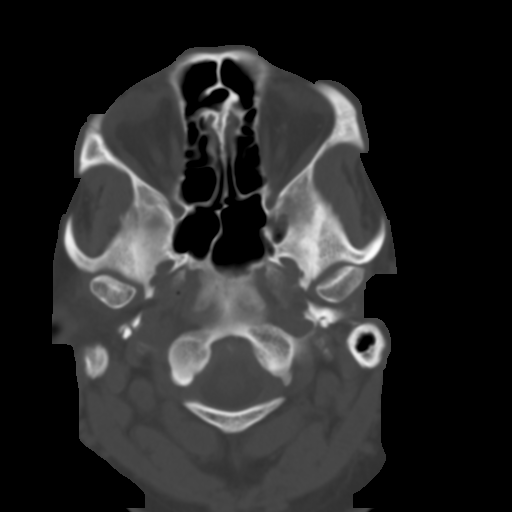
[im 9/35  brain]
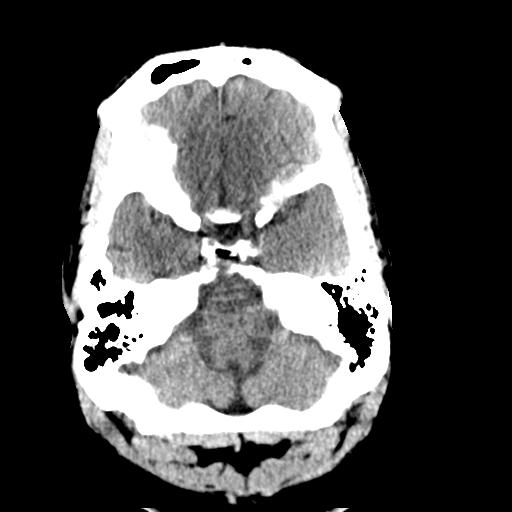
[im 13/35  brain]
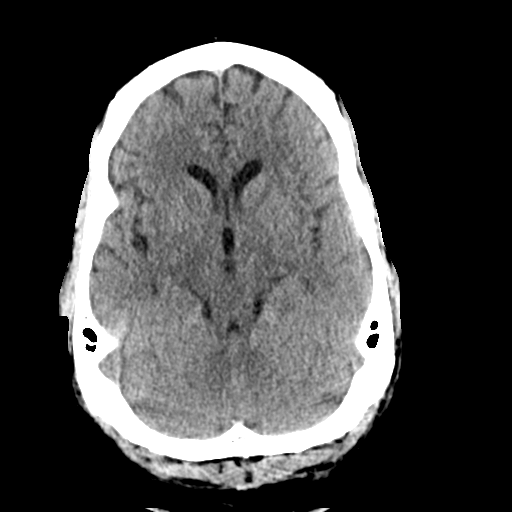
[im 18/35  brain]
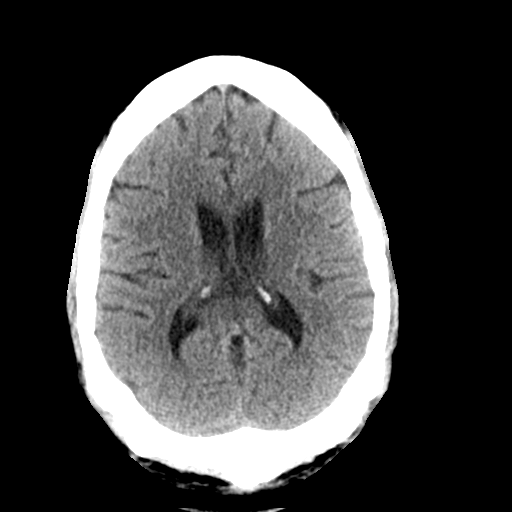
[im 22/35  brain]
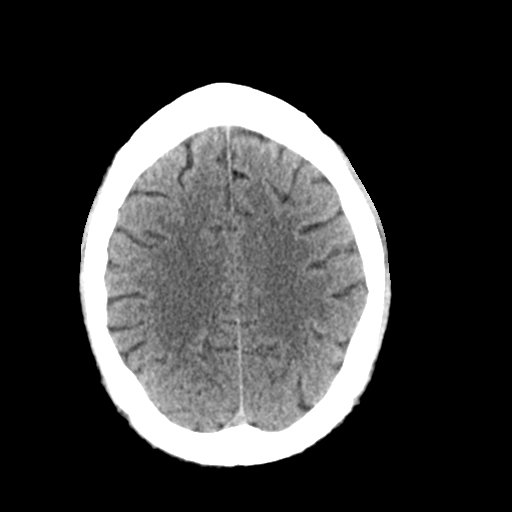
[im 22/35  bone]
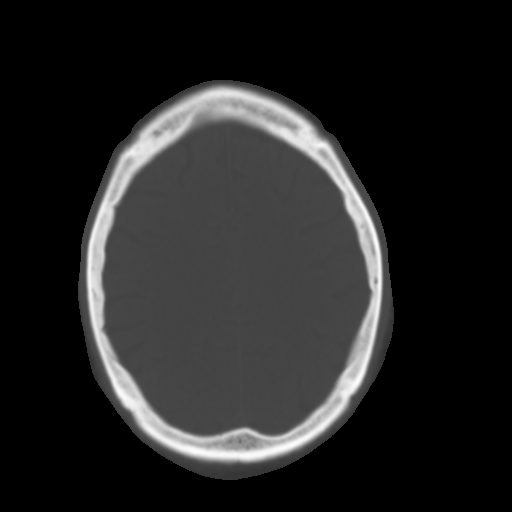
[im 26/35  brain]
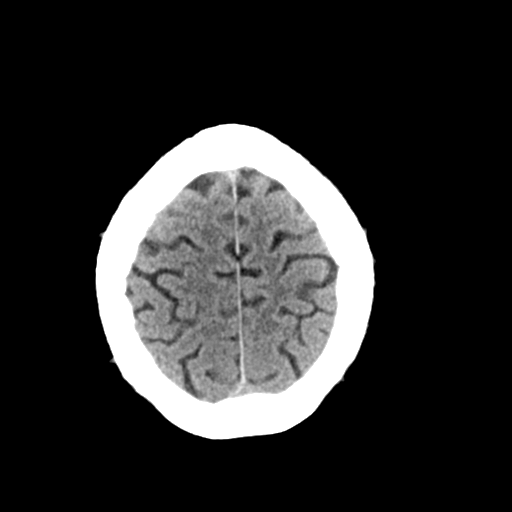
[im 30/35  brain]
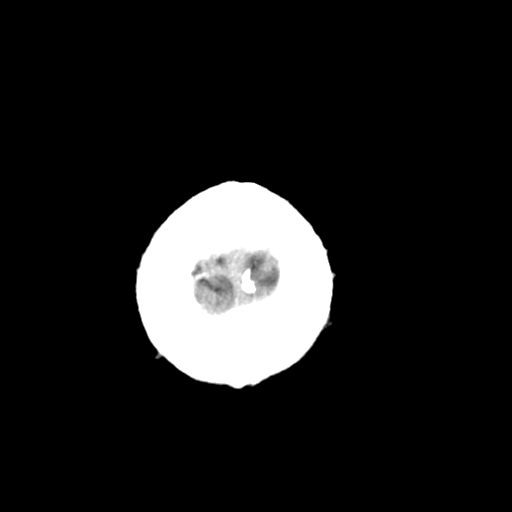

[Series 4: head bone · axial · 0.44mm/px · z∈[-234,-216]mm · 2 of 87 slices shown]
[im 9/87  bone]
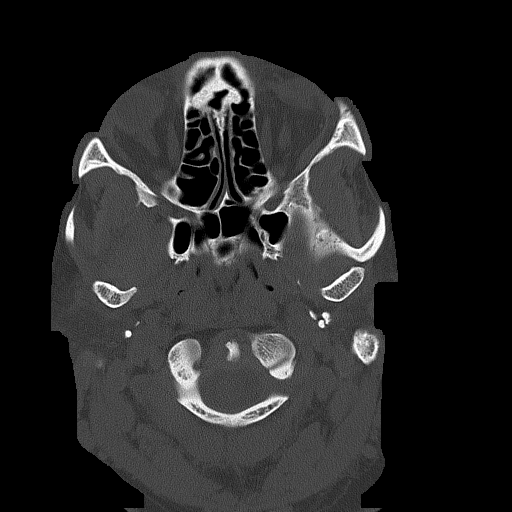
[im 18/87  bone]
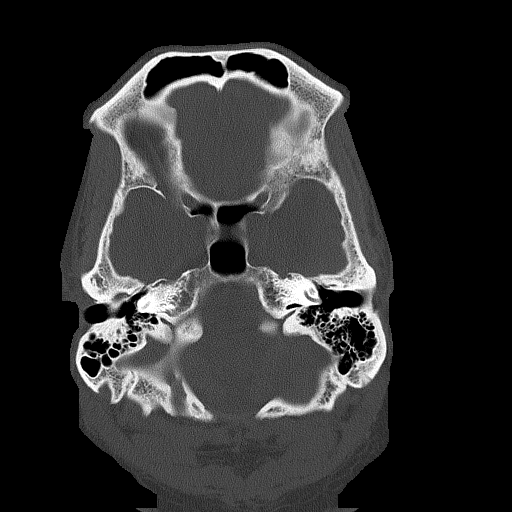

[Series 5: head without cor · coronal · non-contrast · 0.34mm/px · 3 of 77 slices shown]
[im 26/77  brain]
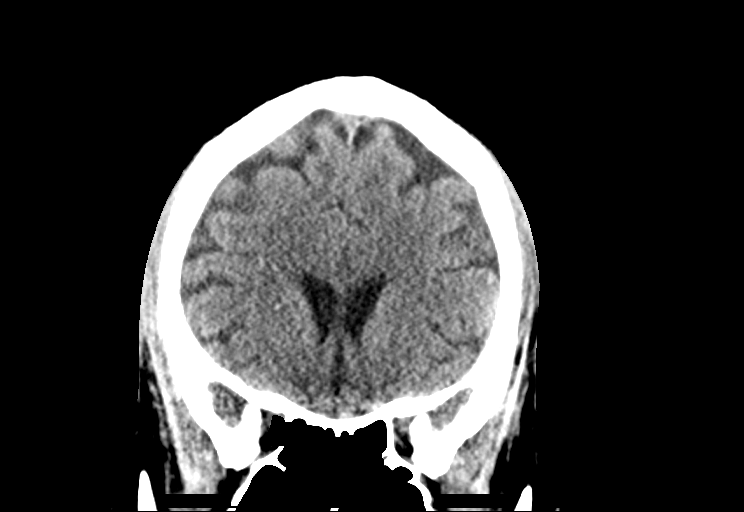
[im 34/77  brain]
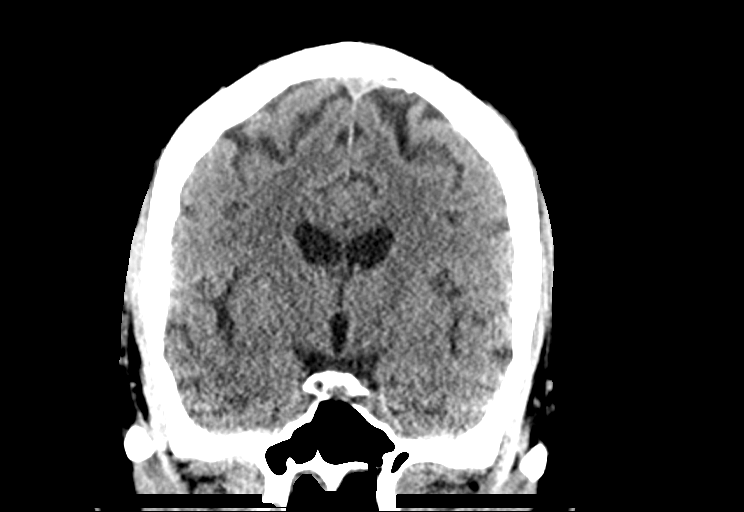
[im 43/77  brain]
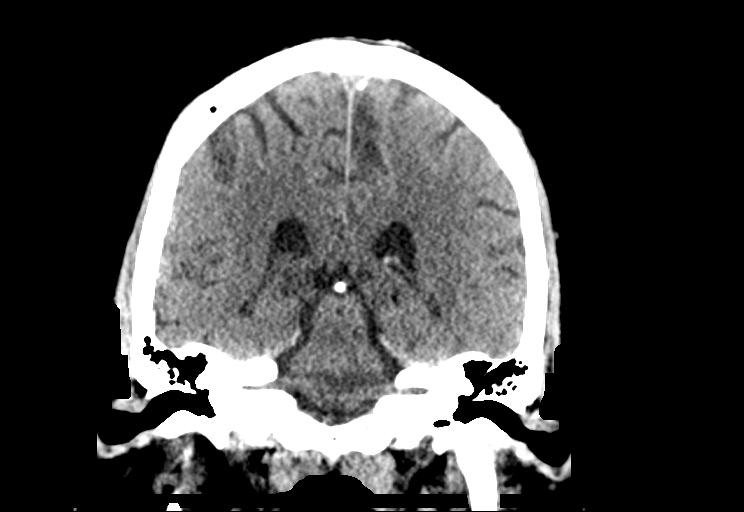

[Series 6: head without sag · sagittal · non-contrast · 0.34mm/px · 3 of 67 slices shown]
[im 23/67  brain]
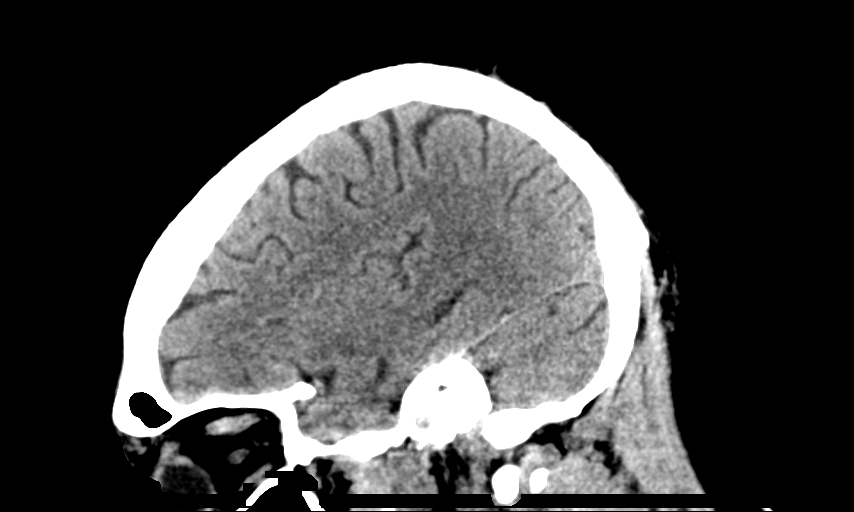
[im 34/67  brain]
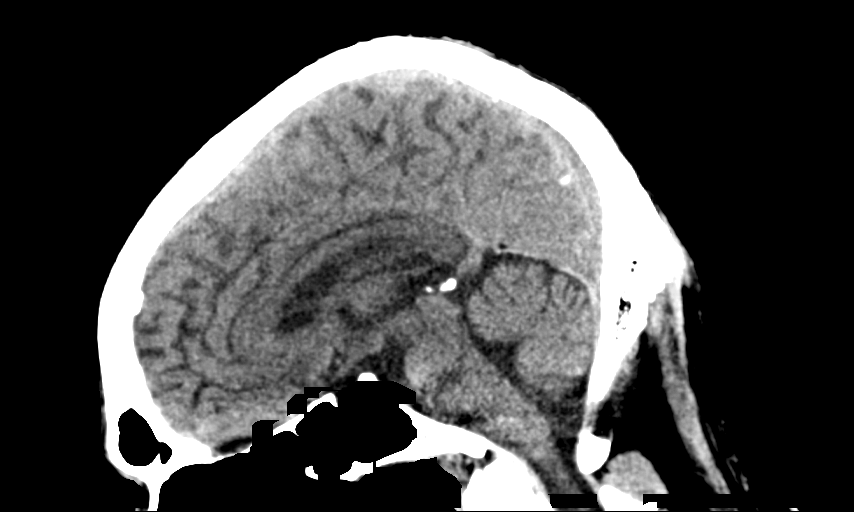
[im 45/67  brain]
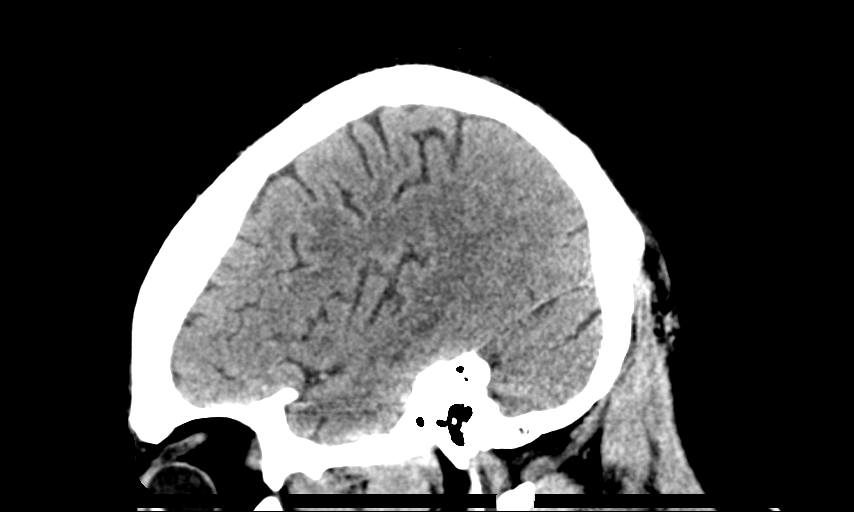

[15 of 47 positions shown; findings below may reference images not displayed]

FINDINGS: Brain: There is no evidence of acute infarct, intracranial
hemorrhage, mass, midline shift, or extra-axial fluid collection.
The ventricles and sulci are normal.

Vascular: No hyperdense vessel.

Skull: No fracture or focal osseous lesion.

Sinuses/Orbits: Visualized paranasal sinuses and mastoid air cells
are clear. Visualized orbits are unremarkable.

Other: None.
IMPRESSION: Negative head CT.

## 2019-08-14 NOTE — Therapy (Signed)
[place] MAIN [facility] SERVICES [address] [place], [place], [address] Phone: [phone]   Fax:  [phone]  Physical Therapy Treatment  Patient Details  Name: Trevor [name] MRN: [id number] Date of Birth: 09-04-1955 Referring Provider (PT): Jamse Arn, MD   Encounter Date: 08/14/2019   PT End of Session - 08/14/19 1323    Visit Number 35    Number of Visits 42    Date for PT Re-Evaluation 08/28/19    Authorization Type 5/10 PN 07/22/19    PT Start Time 1300    PT Stop Time 1344    PT Time Calculation (min) 44 min    Equipment Utilized During Treatment Gait belt;Other (comment)    Activity Tolerance Patient limited by fatigue;Patient tolerated treatment well    Behavior During Therapy [facility] for tasks assessed/performed           Past Medical History:  Diagnosis Date  . COVID-29 Dec 2018  . Diabetes mellitus without complication ([place])   . Heart murmur   . Hyperlipidemia   . Hypertension   . Sleep apnea     Past Surgical History:  Procedure Laterality Date  . AMPUTATION Right 03/31/2017   Procedure: RIGHT FOOT 1ST AND 2ND RAY AMPUTATION;  Surgeon: Newt Minion, MD;  Location: [place];  Service: Orthopedics;  Laterality: Right;  . AMPUTATION Right 04/01/2017   Procedure: AMPUTATION BELOW KNEE;  Surgeon: Newt Minion, MD;  Location: [place];  Service: Orthopedics;  Laterality: Right;  . COLONOSCOPY WITH PROPOFOL N/A 10/02/2017   Procedure: COLONOSCOPY WITH PROPOFOL;  Surgeon: [name], MD;  Location: [facility] ENDOSCOPY;  Service: Gastroenterology;  Laterality: N/A;  . CORNEAL TRANSPLANT      There were no vitals filed for this visit.   Subjective Assessment - 08/14/19 1314    Subjective Patient reports no falls or LOB since last session. Having back pain.    Pertinent History Patient returning to PT after multiple hospitalizations for L foot and COVID-19. Received home health therapy till 02/14/19  . Patient last seen  12/13/2018 in this outpatient clinic. PMH includes DM, heart murmur, HLD, HTN, sleep apnea, cellulitis of L foot, hypomagnesemia, unilateral complete BKA R (2019), stage III CKD, PAD, legally blind, hypokalemia, AKI, and sepsis. Uses wheelchair primarily at home rather than walking with prosthesis.    Limitations Standing;Walking;House hold activities;Other (comment);Lifting    How long can you sit comfortably? hours /na/    How long can you stand comfortably? 15 minutes with UE support    How long can you walk comfortably? walked in the house    Patient Stated Goals walk better.    Currently in Pain? Yes    Pain Score 5     Pain Location Back    Pain Orientation Lower    Pain Descriptors / Indicators Aching    Pain Type Chronic pain    Pain Onset More than a month ago    Pain Frequency Intermittent             Vitals: 145/71         Standing: close CGA with focus on stability ambulate >1000 ft with no rest breaks without SPC close CGA, cues for gait mechanics, arm swing, weight shift and posture. Additional cues for negotiation of turns, people, and obstacles.      Supine; posterior pelvic tilts 15x 3 second hold  5lb ankle weight:  -marching with posterior pelvic tilt 12x each  LE ; 2 sets  -straight leg abduction, adduction 10x each LE.   2 sets each LE -SAQ over bolster. 10x each LE, 3 second holds.  - adduction ball squeeze 15 x 3 seconds   Car mobility: education and performance on safety of entering car without UE support.    Pt educated throughout session about proper posture and technique with exercises. Improved exercise technique, movement at target joints, use of target muscles after min to mod verbal, visual, tactile cues.                         PT Education - 08/14/19 1320    Education Details exercise technique, supine strengthening for back pain reduction.    Person(s) Educated Patient    Methods Explanation;Demonstration;Tactile  cues;Verbal cues    Comprehension Verbalized understanding;Returned demonstration;Verbal cues required;Tactile cues required            PT Short Term Goals - 07/03/19 1310      PT SHORT TERM GOAL #1   Title Patient will be independent in home exercise program to improve strength/mobility for better functional independence with ADLs.    Baseline 3/3 HEP next session 4/12 HEP compliant 4/28: compliant; 5/24: HEP compliant 6/23 HEp compliant    Time 2    Period Weeks    Status Achieved      PT SHORT TERM GOAL #2   Title Patient will ambulate for 6 minutes without seated rest break to increase tolerance for 6 minute walk test    Baseline 3/3: only able to ambulate 3 minutes and 59 seconds prior to rest break 4/12 6 min walk test deferred till next session 4/14: 6 minutes with one rest break 5/24: 10 seconds from full 6 minutes 6/23: unable to perform this session    Time 2    Period Weeks    Status On-going    Target Date 07/17/19             PT Long Term Goals - 07/03/19 1357      PT LONG TERM GOAL #1   Title Patient will increase six minute walk test distance to >1000 with LRAD for progression to community ambulator and improve gait ability    Baseline 3/3: 700 ft, terminated early 4/12: deferred due to walking down to PT gym 4/14: 740 ft 6 minutes with one seated rest break 5/24: 800 ft with [facility] 6/23: 600 ft terminated due to back pain    Time 8    Period Weeks    Status On-going    Target Date 08/28/19      PT LONG TERM GOAL #2   Title Patient will increase FOTO score to equal to or greater than 57/100 to demonstrate statistically significant improved mobility and quality of life.    Baseline 3/3: 47/100 risk adjusted 43/100 4/12: 41/100 4/28: 50/100 5/24: 46% 6/23: 47%    Time 8    Period Weeks    Status Partially Met    Target Date 08/28/19      PT LONG TERM GOAL #3   Title Patient will increase Berg Balance score by > 6 points (47/56) to demonstrate decreased fall  risk during functional activities.    Baseline 3/3: 41/56 4/12; 49/56    Time 8    Period Weeks    Status Achieved      PT LONG TERM GOAL #4   Title Patient will increase BLE gross strength to 4+/5 as to  improve functional strength for independent gait, increased standing tolerance and increased ADL ability.    Baseline 3/3: LLE grossly 4-/5 R grossly 3+ hip 4- knee 4/12: LLE grossly 4- to 4/5 with R 4- hip 6/23: did not tolerate resistance due to back pain    Time 8    Period Weeks    Status Partially Met    Target Date 08/28/19      PT LONG TERM GOAL #5   Title Patient will increase dynamic gait index score to >19/24 as to demonstrate reduced fall risk and improved dynamic gait balance for better safety with community/home ambulation.    Baseline 4/28: 14/24 5/24: 17/24 6/24: 20/24    Time 8    Period Weeks    Status Achieved      PT LONG TERM GOAL #6   Title Patient will be mod I for negotiating curb with LRAD while wearing prosthetic for safe community ambulation.    Baseline needs assistance at this time    Time 8    Period Weeks    Status New    Target Date 08/29/19                 Plan - 08/14/19 1332    Clinical Impression Statement Patient's session limited by low back pain requiring rest of session to be completed in supine position. Patient continues to progress with functional strengthening tolerating increased sets and repetitions with higher weight resistance. The patient would benefit from further skilled PT intervention to maximize QOL, safety, and functional mobility    Personal Factors and Comorbidities Age;Comorbidity 3+;Finances;Fitness;Past/Current Experience;Social Background;Time since onset of injury/illness/exacerbation;Transportation    Comorbidities DM, heart murmur, HLD, HTN, sleep apnea, cellulitis of L foot, hypomagnesemia, unilateral complete BKA R (2019), stage III CKD, PAD, legally blind, hypokalemia, AKI, and sepsis    Examination-Activity  Limitations Bathing;Bed Mobility;Caring for [employer];Locomotion Level;Lift;Stand;Toileting;Transfers    Examination-Participation Restrictions Church;Cleaning;Community Activity;Driving;Interpersonal Relationship;Laundry;Shop;Meal Prep;Yard 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 complexity    Rehab Potential Fair    PT Frequency 2x / week    PT Duration 8 weeks    PT Treatment/Interventions ADLs/Self Care Home Management;Cryotherapy;Electrical Stimulation;Ultrasound;Traction;Moist Heat;DME Instruction;Gait training;Stair training;Functional mobility training;Therapeutic activities;Therapeutic exercise;Patient/family education;Neuromuscular re-education;Balance training;Prosthetic Training;Wheelchair mobility training;Manual techniques;Manual lymph drainage;Compression bandaging;Taping;Energy conservation;Passive range of motion;Aquatic Therapy;Vestibular    PT Next Visit Plan walk on beach    Consulted and Agree with Plan of Care Patient           Patient will benefit from skilled therapeutic intervention in order to improve the following deficits and impairments:  Abnormal gait, Decreased activity tolerance, Decreased balance, Decreased knowledge of precautions, Decreased endurance, Decreased knowledge of use of DME, Decreased mobility, Decreased range of motion, Difficulty walking, Decreased strength, Increased edema, Impaired flexibility, Impaired perceived functional ability, Prosthetic Dependency, Postural dysfunction, Improper body mechanics, Pain, Decreased skin integrity, Impaired vision/preception, Impaired sensation  Visit Diagnosis: Muscle weakness (generalized)  Unsteadiness on feet  Other abnormalities of gait and mobility  Unilateral complete BKA, right, sequela ([place])     Problem List Patient Active Problem List   Diagnosis Date Noted  . Elevated lactic acid level   . Diabetic ketoacidosis without coma  associated with type 2 diabetes mellitus ([place])   . Cellulitis of left foot 12/17/2018  . Pain due to onychomycosis of toenail of right foot 07/05/2018  . Abnormality of gait 10/12/2017  . Type 2 diabetes mellitus with hyperglycemia, with long-term current use of insulin ([place])   .  Flatulence   . Hypomagnesemia   . Unilateral complete BKA, right, sequela ([place])   . Benign essential HTN   . Hypoalbuminemia due to protein-calorie malnutrition ([place])   . S/P below knee amputation, right ([place]) 04/12/2017  . Acute blood loss anemia   . Other encephalopathy 04/08/2017  . Encephalopathy 04/08/2017  . Altered mental status 04/08/2017  . Labile blood pressure   . Labile blood glucose   . Drug induced constipation   . Stage 3 chronic kidney disease   . Bacteremia   . S/P unilateral BKA (below knee amputation), right ([place]) 04/04/2017  . PAD (peripheral artery disease) ([place])   . Type 2 diabetes mellitus with right diabetic foot ulcer ([place])   . Post-operative pain   . Legally blind   . Upper GI bleed   . Streptococcal bacteremia 04/01/2017  . Hypokalemia 03/30/2017  . Uncontrolled type 2 diabetes mellitus with hyperglycemia, with long-term current use of insulin ([place]) 03/30/2017  . Type 2 diabetes mellitus with peripheral neuropathy ([place]) 03/30/2017  . AKI (acute kidney injury) ([place]) 03/30/2017  . CKD stage 3 due to type 2 diabetes mellitus ([place]) 03/30/2017  . Sepsis ([place]) 03/30/2017  . Heart murmur 11/22/2016  . Hyperlipidemia associated with type 2 diabetes mellitus ([place]) 11/22/2016  . Obstructive sleep apnea syndrome 11/22/2016  . Essential hypertension 12/17/2012   Janna Arch, PT, DPT   08/14/2019, 1:51 PM  [place] MAIN [facility] SERVICES [address] [place], [place], [address] Phone: [phone]   Fax:  [phone]  Name: Trevor [name] MRN: [id number] Date of Birth: 29-Nov-1955

## 2019-08-19 ENCOUNTER — Ambulatory Visit: Payer: Medicare Other

## 2019-08-19 ENCOUNTER — Other Ambulatory Visit: Payer: Self-pay

## 2019-08-19 DIAGNOSIS — R2689 Other abnormalities of gait and mobility: Secondary | ICD-10-CM

## 2019-08-19 DIAGNOSIS — M6281 Muscle weakness (generalized): Secondary | ICD-10-CM

## 2019-08-19 DIAGNOSIS — R2681 Unsteadiness on feet: Secondary | ICD-10-CM

## 2019-08-19 DIAGNOSIS — S88111S Complete traumatic amputation at level between knee and ankle, right lower leg, sequela: Secondary | ICD-10-CM

## 2019-08-19 NOTE — Therapy (Signed)
[place] MAIN [facility] SERVICES [address] [place], [place], [address] Phone: [phone]   Fax:  [phone]  Physical Therapy Treatment  Patient Details  Name: Trevor [name] MRN: [id number] Date of Birth: Dec 18, 1955 Referring Provider (PT): Jamse Arn, MD   Encounter Date: 08/19/2019   PT End of Session - 08/19/19 1344    Visit Number 36    Number of Visits 42    Date for PT Re-Evaluation 08/28/19    Authorization Type 6/10 PN 07/22/19    PT Start Time 1305    PT Stop Time 1345    PT Time Calculation (min) 40 min    Equipment Utilized During Treatment Gait belt;Other (comment)    Activity Tolerance Patient limited by fatigue;Patient tolerated treatment well    Behavior During Therapy [facility] for tasks assessed/performed           Past Medical History:  Diagnosis Date  . COVID-29 Dec 2018  . Diabetes mellitus without complication ([place])   . Heart murmur   . Hyperlipidemia   . Hypertension   . Sleep apnea     Past Surgical History:  Procedure Laterality Date  . AMPUTATION Right 03/31/2017   Procedure: RIGHT FOOT 1ST AND 2ND RAY AMPUTATION;  Surgeon: Newt Minion, MD;  Location: [place];  Service: Orthopedics;  Laterality: Right;  . AMPUTATION Right 04/01/2017   Procedure: AMPUTATION BELOW KNEE;  Surgeon: Newt Minion, MD;  Location: [place];  Service: Orthopedics;  Laterality: Right;  . COLONOSCOPY WITH PROPOFOL N/A 10/02/2017   Procedure: COLONOSCOPY WITH PROPOFOL;  Surgeon: [name], MD;  Location: [facility] ENDOSCOPY;  Service: Gastroenterology;  Laterality: N/A;  . CORNEAL TRANSPLANT      There were no vitals filed for this visit.   Subjective Assessment - 08/19/19 1332    Subjective Patient reports having a good weekend. No falls or LOB. No back pain today.    Pertinent History Patient returning to PT after multiple hospitalizations for L foot and COVID-19. Received home health therapy till 02/14/19  . Patient last  seen 12/13/2018 in this outpatient clinic. PMH includes DM, heart murmur, HLD, HTN, sleep apnea, cellulitis of L foot, hypomagnesemia, unilateral complete BKA R (2019), stage III CKD, PAD, legally blind, hypokalemia, AKI, and sepsis. Uses wheelchair primarily at home rather than walking with prosthesis.    Limitations Standing;Walking;House hold activities;Other (comment);Lifting    How long can you sit comfortably? hours /na/    How long can you stand comfortably? 15 minutes with UE support    How long can you walk comfortably? walked in the house    Patient Stated Goals walk better.    Currently in Pain? No/denies             vitals at start of session : 136/73 pulse 65     Standing: close CGA with focus on stability ambulate >1000 ft with no rest breaks without SPC close CGA, cues for gait mechanics, arm swing, weight shift and posture. Additional cues for negotiation of turns, people, and obstacles.      Ambulate w SPC looking for cones with lateral  head movements/scans x 200 ft    Next to support surface:  -half bosu ball lateral modified lunge SUE to no UE support 10x each side -modified single limb stance 20 second holds 2x each LE with BUE support   -cone taps with SUE support 8x each LE (3 cone sequence); very challenging for  prosthetic limb in regards to force and position in space -UE support modified  lunge BUE support with support surface 10x each LE  -standing hamstring stretch 2x 30 seconds each LE   Seated:  -gluteal squeeze 10x 3 second holds   Pt educated throughout session about proper posture and technique with exercises. Improved exercise technique, movement at target joints, use of target muscles after min to mod verbal, visual, tactile cues.                        PT Education - 08/19/19 1344    Education Details exercise technique, body mechanics,    Person(s) Educated Patient    Methods Explanation;Demonstration;Tactile cues;Verbal  cues    Comprehension Verbalized understanding;Returned demonstration;Verbal cues required;Tactile cues required            PT Short Term Goals - 07/03/19 1310      PT SHORT TERM GOAL #1   Title Patient will be independent in home exercise program to improve strength/mobility for better functional independence with ADLs.    Baseline 3/3 HEP next session 4/12 HEP compliant 4/28: compliant; 5/24: HEP compliant 6/23 HEp compliant    Time 2    Period Weeks    Status Achieved      PT SHORT TERM GOAL #2   Title Patient will ambulate for 6 minutes without seated rest break to increase tolerance for 6 minute walk test    Baseline 3/3: only able to ambulate 3 minutes and 59 seconds prior to rest break 4/12 6 min walk test deferred till next session 4/14: 6 minutes with one rest break 5/24: 10 seconds from full 6 minutes 6/23: unable to perform this session    Time 2    Period Weeks    Status On-going    Target Date 07/17/19             PT Long Term Goals - 07/03/19 1357      PT LONG TERM GOAL #1   Title Patient will increase six minute walk test distance to >1000 with LRAD for progression to community ambulator and improve gait ability    Baseline 3/3: 700 ft, terminated early 4/12: deferred due to walking down to PT gym 4/14: 740 ft 6 minutes with one seated rest break 5/24: 800 ft with [facility] 6/23: 600 ft terminated due to back pain    Time 8    Period Weeks    Status On-going    Target Date 08/28/19      PT LONG TERM GOAL #2   Title Patient will increase FOTO score to equal to or greater than 57/100 to demonstrate statistically significant improved mobility and quality of life.    Baseline 3/3: 47/100 risk adjusted 43/100 4/12: 41/100 4/28: 50/100 5/24: 46% 6/23: 47%    Time 8    Period Weeks    Status Partially Met    Target Date 08/28/19      PT LONG TERM GOAL #3   Title Patient will increase Berg Balance score by > 6 points (47/56) to demonstrate decreased fall risk during  functional activities.    Baseline 3/3: 41/56 4/12; 49/56    Time 8    Period Weeks    Status Achieved      PT LONG TERM GOAL #4   Title Patient will increase BLE gross strength to 4+/5 as to improve functional strength for independent gait, increased standing tolerance and increased ADL ability.  Baseline 3/3: LLE grossly 4-/5 R grossly 3+ hip 4- knee 4/12: LLE grossly 4- to 4/5 with R 4- hip 6/23: did not tolerate resistance due to back pain    Time 8    Period Weeks    Status Partially Met    Target Date 08/28/19      PT LONG TERM GOAL #5   Title Patient will increase dynamic gait index score to >19/24 as to demonstrate reduced fall risk and improved dynamic gait balance for better safety with community/home ambulation.    Baseline 4/28: 14/24 5/24: 17/24 6/24: 20/24    Time 8    Period Weeks    Status Achieved      PT LONG TERM GOAL #6   Title Patient will be mod I for negotiating curb with LRAD while wearing prosthetic for safe community ambulation.    Baseline needs assistance at this time    Time 8    Period Weeks    Status New    Target Date 08/29/19                 Plan - 08/19/19 1438    Clinical Impression Statement Patient continues to progress with functional stability with mobility with decreasing need for UE support throughout physical therapy session. Single limb stability is improving with decreased instability however fine motor coordination is challenging for patient at this time. The patient would benefit from further skilled PT intervention to maximize QOL, safety, and functional mobility    Personal Factors and Comorbidities Age;Comorbidity 3+;Finances;Fitness;Past/Current Experience;Social Background;Time since onset of injury/illness/exacerbation;Transportation    Comorbidities DM, heart murmur, HLD, HTN, sleep apnea, cellulitis of L foot, hypomagnesemia, unilateral complete BKA R (2019), stage III CKD, PAD, legally blind, hypokalemia, AKI, and  sepsis    Examination-Activity Limitations Bathing;Bed Mobility;Caring for [employer];Locomotion Level;Lift;Stand;Toileting;Transfers    Examination-Participation Restrictions Church;Cleaning;Community Activity;Driving;Interpersonal Relationship;Laundry;Shop;Meal Prep;Yard 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 complexity    Rehab Potential Fair    PT Frequency 2x / week    PT Duration 8 weeks    PT Treatment/Interventions ADLs/Self Care Home Management;Cryotherapy;Electrical Stimulation;Ultrasound;Traction;Moist Heat;DME Instruction;Gait training;Stair training;Functional mobility training;Therapeutic activities;Therapeutic exercise;Patient/family education;Neuromuscular re-education;Balance training;Prosthetic Training;Wheelchair mobility training;Manual techniques;Manual lymph drainage;Compression bandaging;Taping;Energy conservation;Passive range of motion;Aquatic Therapy;Vestibular    PT Next Visit Plan walk on beach    Consulted and Agree with Plan of Care Patient           Patient will benefit from skilled therapeutic intervention in order to improve the following deficits and impairments:  Abnormal gait, Decreased activity tolerance, Decreased balance, Decreased knowledge of precautions, Decreased endurance, Decreased knowledge of use of DME, Decreased mobility, Decreased range of motion, Difficulty walking, Decreased strength, Increased edema, Impaired flexibility, Impaired perceived functional ability, Prosthetic Dependency, Postural dysfunction, Improper body mechanics, Pain, Decreased skin integrity, Impaired vision/preception, Impaired sensation  Visit Diagnosis: Muscle weakness (generalized)  Unsteadiness on feet  Other abnormalities of gait and mobility  Unilateral complete BKA, right, sequela ([place])     Problem List Patient Active Problem List   Diagnosis Date Noted  . Elevated lactic acid level   .  Diabetic ketoacidosis without coma associated with type 2 diabetes mellitus ([place])   . Cellulitis of left foot 12/17/2018  . Pain due to onychomycosis of toenail of right foot 07/05/2018  . Abnormality of gait 10/12/2017  . Type 2 diabetes mellitus with hyperglycemia, with long-term current use of insulin ([place])   . Flatulence   . Hypomagnesemia   . Unilateral complete  BKA, right, sequela ([place])   . Benign essential HTN   . Hypoalbuminemia due to protein-calorie malnutrition ([place])   . S/P below knee amputation, right ([place]) 04/12/2017  . Acute blood loss anemia   . Other encephalopathy 04/08/2017  . Encephalopathy 04/08/2017  . Altered mental status 04/08/2017  . Labile blood pressure   . Labile blood glucose   . Drug induced constipation   . Stage 3 chronic kidney disease   . Bacteremia   . S/P unilateral BKA (below knee amputation), right ([place]) 04/04/2017  . PAD (peripheral artery disease) ([place])   . Type 2 diabetes mellitus with right diabetic foot ulcer ([place])   . Post-operative pain   . Legally blind   . Upper GI bleed   . Streptococcal bacteremia 04/01/2017  . Hypokalemia 03/30/2017  . Uncontrolled type 2 diabetes mellitus with hyperglycemia, with long-term current use of insulin ([place]) 03/30/2017  . Type 2 diabetes mellitus with peripheral neuropathy ([place]) 03/30/2017  . AKI (acute kidney injury) ([place]) 03/30/2017  . CKD stage 3 due to type 2 diabetes mellitus ([place]) 03/30/2017  . Sepsis ([place]) 03/30/2017  . Heart murmur 11/22/2016  . Hyperlipidemia associated with type 2 diabetes mellitus ([place]) 11/22/2016  . Obstructive sleep apnea syndrome 11/22/2016  . Essential hypertension 12/17/2012   Janna Arch, PT, DPT   08/19/2019, 2:39 PM  [place] MAIN [facility] SERVICES [address] [place], [place], [address] Phone: [phone]   Fax:  [phone]  Name: Trevor [name] MRN: [id number] Date of Birth: 10-29-1955

## 2019-08-21 ENCOUNTER — Ambulatory Visit: Payer: Medicare Other

## 2019-08-21 ENCOUNTER — Other Ambulatory Visit: Payer: Self-pay

## 2019-08-21 DIAGNOSIS — M6281 Muscle weakness (generalized): Secondary | ICD-10-CM

## 2019-08-21 DIAGNOSIS — R2689 Other abnormalities of gait and mobility: Secondary | ICD-10-CM

## 2019-08-21 DIAGNOSIS — R2681 Unsteadiness on feet: Secondary | ICD-10-CM

## 2019-08-21 DIAGNOSIS — S88111S Complete traumatic amputation at level between knee and ankle, right lower leg, sequela: Secondary | ICD-10-CM

## 2019-08-21 NOTE — Therapy (Signed)
[place] MAIN [facility] SERVICES [address] [place], [place], [address] Phone: [phone]   Fax:  [phone]  Physical Therapy Treatment  Patient Details  Name: Trevor [name] MRN: [id number] Date of Birth: 30-Mar-1955 Referring Provider (PT): Jamse Arn, MD   Encounter Date: 08/21/2019   PT End of Session - 08/21/19 1258    Visit Number 37    Number of Visits 42    Date for PT Re-Evaluation 08/28/19    Authorization Type 7/10 PN 07/22/19    PT Start Time 1300    PT Stop Time 1344    PT Time Calculation (min) 44 min    Equipment Utilized During Treatment Gait belt;Other (comment)    Activity Tolerance Patient limited by fatigue;Patient tolerated treatment well    Behavior During Therapy [facility] for tasks assessed/performed           Past Medical History:  Diagnosis Date   COVID-29 Dec 2018   Diabetes mellitus without complication ([place])    Heart murmur    Hyperlipidemia    Hypertension    Sleep apnea     Past Surgical History:  Procedure Laterality Date   AMPUTATION Right 03/31/2017   Procedure: RIGHT FOOT 1ST AND 2ND RAY AMPUTATION;  Surgeon: Newt Minion, MD;  Location: [place];  Service: Orthopedics;  Laterality: Right;   AMPUTATION Right 04/01/2017   Procedure: AMPUTATION BELOW KNEE;  Surgeon: Newt Minion, MD;  Location: [place];  Service: Orthopedics;  Laterality: Right;   COLONOSCOPY WITH PROPOFOL N/A 10/02/2017   Procedure: COLONOSCOPY WITH PROPOFOL;  Surgeon: [name], MD;  Location: [facility] ENDOSCOPY;  Service: Gastroenterology;  Laterality: N/A;   CORNEAL TRANSPLANT      There were no vitals filed for this visit.   Subjective Assessment - 08/21/19 1301    Subjective Patient reports no falls or LOB since last session. Is "feeling" the hot weather. Upon questioning about pain states "not yet ".    Pertinent History Patient returning to PT after multiple hospitalizations for L foot and COVID-19.  Received home health therapy till 02/14/19  . Patient last seen 12/13/2018 in this outpatient clinic. PMH includes DM, heart murmur, HLD, HTN, sleep apnea, cellulitis of L foot, hypomagnesemia, unilateral complete BKA R (2019), stage III CKD, PAD, legally blind, hypokalemia, AKI, and sepsis. Uses wheelchair primarily at home rather than walking with prosthesis.    Limitations Standing;Walking;House hold activities;Other (comment);Lifting    How long can you sit comfortably? hours /na/    How long can you stand comfortably? 15 minutes with UE support    How long can you walk comfortably? walked in the house    Patient Stated Goals walk better.    Currently in Pain? No/denies               vitals at start of session : 127/64     Standing: close CGA with focus on stability     Ambulate w SPC horizontal head turns 2x 100 ft; close CGA Ambulate w/o SPC vertical head turns 2x 100 ft; close CGA   ambulate 150 ft w/o AD with focus on equal step length core activation  In // bars;  Step over and back orange hurdle 10x each LE; bilateral flat hand support airex balance beam:  - side stepping 8x length of // bars.  airex pad: throw target at board for pertubation's, stabilizations x 15  Ballon taps reaching inside/outside BOS for pertubation's, reaction  timing, and ability to maintain COM x3 minutes   Seated:  -gluteal squeeze 10x 3 second holds  -hamstring stretch 2x 30 seconds each LE   Pt educated throughout session about proper posture and technique with exercises. Improved exercise technique, movement at target joints, use of target muscles after min to mod verbal, visual, tactile cues.                      PT Education - 08/21/19 1258    Education Details exercise technique, body mechanics    Person(s) Educated Patient    Methods Explanation;Demonstration;Tactile cues;Verbal cues    Comprehension Verbalized understanding;Other (comment);Returned demonstration;Verbal  cues required;Tactile cues required            PT Short Term Goals - 07/03/19 1310      PT SHORT TERM GOAL #1   Title Patient will be independent in home exercise program to improve strength/mobility for better functional independence with ADLs.    Baseline 3/3 HEP next session 4/12 HEP compliant 4/28: compliant; 5/24: HEP compliant 6/23 HEp compliant    Time 2    Period Weeks    Status Achieved      PT SHORT TERM GOAL #2   Title Patient will ambulate for 6 minutes without seated rest break to increase tolerance for 6 minute walk test    Baseline 3/3: only able to ambulate 3 minutes and 59 seconds prior to rest break 4/12 6 min walk test deferred till next session 4/14: 6 minutes with one rest break 5/24: 10 seconds from full 6 minutes 6/23: unable to perform this session    Time 2    Period Weeks    Status On-going    Target Date 07/17/19             PT Long Term Goals - 07/03/19 1357      PT LONG TERM GOAL #1   Title Patient will increase six minute walk test distance to >1000 with LRAD for progression to community ambulator and improve gait ability    Baseline 3/3: 700 ft, terminated early 4/12: deferred due to walking down to PT gym 4/14: 740 ft 6 minutes with one seated rest break 5/24: 800 ft with [facility] 6/23: 600 ft terminated due to back pain    Time 8    Period Weeks    Status On-going    Target Date 08/28/19      PT LONG TERM GOAL #2   Title Patient will increase FOTO score to equal to or greater than 57/100 to demonstrate statistically significant improved mobility and quality of life.    Baseline 3/3: 47/100 risk adjusted 43/100 4/12: 41/100 4/28: 50/100 5/24: 46% 6/23: 47%    Time 8    Period Weeks    Status Partially Met    Target Date 08/28/19      PT LONG TERM GOAL #3   Title Patient will increase Berg Balance score by > 6 points (47/56) to demonstrate decreased fall risk during functional activities.    Baseline 3/3: 41/56 4/12; 49/56    Time 8     Period Weeks    Status Achieved      PT LONG TERM GOAL #4   Title Patient will increase BLE gross strength to 4+/5 as to improve functional strength for independent gait, increased standing tolerance and increased ADL ability.    Baseline 3/3: LLE grossly 4-/5 R grossly 3+ hip 4- knee 4/12: LLE grossly 4- to  4/5 with R 4- hip 6/23: did not tolerate resistance due to back pain    Time 8    Period Weeks    Status Partially Met    Target Date 08/28/19      PT LONG TERM GOAL #5   Title Patient will increase dynamic gait index score to >19/24 as to demonstrate reduced fall risk and improved dynamic gait balance for better safety with community/home ambulation.    Baseline 4/28: 14/24 5/24: 17/24 6/24: 20/24    Time 8    Period Weeks    Status Achieved      PT LONG TERM GOAL #6   Title Patient will be mod I for negotiating curb with LRAD while wearing prosthetic for safe community ambulation.    Baseline needs assistance at this time    Time 8    Period Weeks    Status New    Target Date 08/29/19                 Plan - 08/21/19 1729    Clinical Impression Statement Patient tolerated stability and dynamic mobility interventions well despite diffuse fatigue throughout session. He is able to perform increasing challenges with decreased dependence upon AD. The patient would benefit from further skilled PT intervention to maximize QOL, safety, and functional mobility    Personal Factors and Comorbidities Age;Comorbidity 3+;Finances;Fitness;Past/Current Experience;Social Background;Time since onset of injury/illness/exacerbation;Transportation    Comorbidities DM, heart murmur, HLD, HTN, sleep apnea, cellulitis of L foot, hypomagnesemia, unilateral complete BKA R (2019), stage III CKD, PAD, legally blind, hypokalemia, AKI, and sepsis    Examination-Activity Limitations Bathing;Bed Mobility;Caring for [employer];Locomotion  Level;Lift;Stand;Toileting;Transfers    Examination-Participation Restrictions Church;Cleaning;Community Activity;Driving;Interpersonal Relationship;Laundry;Shop;Meal Prep;Yard 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 complexity    Rehab Potential Fair    PT Frequency 2x / week    PT Duration 8 weeks    PT Treatment/Interventions ADLs/Self Care Home Management;Cryotherapy;Electrical Stimulation;Ultrasound;Traction;Moist Heat;DME Instruction;Gait training;Stair training;Functional mobility training;Therapeutic activities;Therapeutic exercise;Patient/family education;Neuromuscular re-education;Balance training;Prosthetic Training;Wheelchair mobility training;Manual techniques;Manual lymph drainage;Compression bandaging;Taping;Energy conservation;Passive range of motion;Aquatic Therapy;Vestibular    PT Next Visit Plan walk on beach    Consulted and Agree with Plan of Care Patient           Patient will benefit from skilled therapeutic intervention in order to improve the following deficits and impairments:  Abnormal gait, Decreased activity tolerance, Decreased balance, Decreased knowledge of precautions, Decreased endurance, Decreased knowledge of use of DME, Decreased mobility, Decreased range of motion, Difficulty walking, Decreased strength, Increased edema, Impaired flexibility, Impaired perceived functional ability, Prosthetic Dependency, Postural dysfunction, Improper body mechanics, Pain, Decreased skin integrity, Impaired vision/preception, Impaired sensation  Visit Diagnosis: Muscle weakness (generalized)  Unsteadiness on feet  Other abnormalities of gait and mobility  Unilateral complete BKA, right, sequela (HCC)     Problem List Patient Active Problem List   Diagnosis Date Noted   Elevated lactic acid level    Diabetic ketoacidosis without coma associated with type 2 diabetes mellitus (HCC)    Cellulitis of left foot 12/17/2018   Pain due to  onychomycosis of toenail of right foot 07/05/2018   Abnormality of gait 10/12/2017   Type 2 diabetes mellitus with hyperglycemia, with long-term current use of insulin (HCC)    Flatulence    Hypomagnesemia    Unilateral complete BKA, right, sequela (HCC)    Benign essential HTN    Hypoalbuminemia due to protein-calorie malnutrition ([place])    S/P below knee amputation, right ([place]) 04/12/2017  Acute blood loss anemia    Other encephalopathy 04/08/2017   Encephalopathy 04/08/2017   Altered mental status 04/08/2017   Labile blood pressure    Labile blood glucose    Drug induced constipation    Stage 3 chronic kidney disease    Bacteremia    S/P unilateral BKA (below knee amputation), right ([place]) 04/04/2017   PAD (peripheral artery disease) ([place])    Type 2 diabetes mellitus with right diabetic foot ulcer ([place])    Post-operative pain    Legally blind    Upper GI bleed    Streptococcal bacteremia 04/01/2017   Hypokalemia 03/30/2017   Uncontrolled type 2 diabetes mellitus with hyperglycemia, with long-term current use of insulin ([place]) 03/30/2017   Type 2 diabetes mellitus with peripheral neuropathy ([place]) 03/30/2017   AKI (acute kidney injury) ([place]) 03/30/2017   CKD stage 3 due to type 2 diabetes mellitus ([place]) 03/30/2017   Sepsis ([place]) 03/30/2017   Heart murmur 11/22/2016   Hyperlipidemia associated with type 2 diabetes mellitus ([place]) 11/22/2016   Obstructive sleep apnea syndrome 11/22/2016   Essential hypertension 12/17/2012   Janna Arch, PT, DPT   08/21/2019, 5:30 PM  [place] MAIN [facility] SERVICES [address] [place], [place], [address] Phone: [phone]   Fax:  [phone]  Name: MIKEY MAFFETT MRN: [id number] Date of Birth: 1955/03/10

## 2019-08-26 ENCOUNTER — Ambulatory Visit: Payer: Medicare Other

## 2019-08-26 ENCOUNTER — Other Ambulatory Visit: Payer: Self-pay

## 2019-08-26 DIAGNOSIS — M6281 Muscle weakness (generalized): Secondary | ICD-10-CM | POA: Diagnosis not present

## 2019-08-26 DIAGNOSIS — S88111S Complete traumatic amputation at level between knee and ankle, right lower leg, sequela: Secondary | ICD-10-CM

## 2019-08-26 DIAGNOSIS — R2681 Unsteadiness on feet: Secondary | ICD-10-CM

## 2019-08-26 DIAGNOSIS — R2689 Other abnormalities of gait and mobility: Secondary | ICD-10-CM

## 2019-08-26 NOTE — Therapy (Signed)
[place] MAIN [facility] SERVICES [address] [place], [place], [address] Phone: [phone]   Fax:  [phone]  Physical Therapy Treatment  Patient Details  Name: Trevor [name] MRN: [id number] Date of Birth: 1955-10-15 Referring Provider (PT): Jamse Arn, MD   Encounter Date: 08/26/2019   PT End of Session - 08/26/19 1328    Visit Number 38    Number of Visits 42    Date for PT Re-Evaluation 08/28/19    Authorization Type 8/10 PN 07/22/19    PT Start Time 1306    PT Stop Time 1344    PT Time Calculation (min) 38 min    Equipment Utilized During Treatment Gait belt;Other (comment)    Activity Tolerance Patient limited by fatigue;Patient tolerated treatment well    Behavior During Therapy [facility] for tasks assessed/performed           Past Medical History:  Diagnosis Date  . COVID-29 Dec 2018  . Diabetes mellitus without complication ([place])   . Heart murmur   . Hyperlipidemia   . Hypertension   . Sleep apnea     Past Surgical History:  Procedure Laterality Date  . AMPUTATION Right 03/31/2017   Procedure: RIGHT FOOT 1ST AND 2ND RAY AMPUTATION;  Surgeon: Newt Minion, MD;  Location: [place];  Service: Orthopedics;  Laterality: Right;  . AMPUTATION Right 04/01/2017   Procedure: AMPUTATION BELOW KNEE;  Surgeon: Newt Minion, MD;  Location: [place];  Service: Orthopedics;  Laterality: Right;  . COLONOSCOPY WITH PROPOFOL N/A 10/02/2017   Procedure: COLONOSCOPY WITH PROPOFOL;  Surgeon: [name], MD;  Location: [facility] ENDOSCOPY;  Service: Gastroenterology;  Laterality: N/A;  . CORNEAL TRANSPLANT      There were no vitals filed for this visit.   Subjective Assessment - 08/26/19 1325    Subjective Patient reports he is having more phantom pains down prosthetic limb. Has been moving and busy over the weekend. Ready for his recert next session.    Pertinent History Patient returning to PT after multiple hospitalizations for L foot  and COVID-19. Received home health therapy till 02/14/19  . Patient last seen 12/13/2018 in this outpatient clinic. PMH includes DM, heart murmur, HLD, HTN, sleep apnea, cellulitis of L foot, hypomagnesemia, unilateral complete BKA R (2019), stage III CKD, PAD, legally blind, hypokalemia, AKI, and sepsis. Uses wheelchair primarily at home rather than walking with prosthesis.    Limitations Standing;Walking;House hold activities;Other (comment);Lifting    How long can you sit comfortably? hours /na/    How long can you stand comfortably? 15 minutes with UE support    How long can you walk comfortably? walked in the house    Patient Stated Goals walk better.    Currently in Pain? Yes    Pain Score 3     Pain Location Back    Pain Orientation Lower    Pain Descriptors / Indicators Aching    Pain Type Chronic pain    Pain Onset More than a month ago    Pain Frequency Intermittent             vitals at start of session : 136/73     Standing: close CGA with focus on stability     ambulate >1000 ft withno rest breaks withoutSPC close CGA, cues for gait mechanics, arm swing, weight shift and posture. Additional cues for negotiation of turns, people, and obstacles.    by support bar; CGA with cues  for safety and sequencing.  -Step over and back orange hurdle 10x each LE;single UE support -airex pad marching no UE support 12x each LE -airex pad horizontal head turns 20x cues for core activation.  -cone taps with SUE support 10x each LE (3 cone sequence); very challenging for prosthetic limb in regards to force and position in space -UE support modified  lunge BUE support with support surface 10x each LE -standing hamstring stretch 2x 30 seconds each LE     Seated:  -seated volleyball with upright posture for pertubations, stability and core activation.  -gluteal squeeze 10x 3 second holds  -hamstring stretch 2x 30 seconds each LE    Pt educated throughout session about proper  posture and technique with exercises. Improved exercise technique, movement at target joints, use of target muscles after min to mod verbal, visual, tactile cues.    Patient is improving with stability on unstable surfaces as well as decrease reliance upon UE support with dynamic mobility. Occasional rest breaks due to fatigue and overheating required. He is aware next session will be a recert and will have to perform goals. The patient would benefit from further skilled PT intervention to maximize QOL, safety, and functional mobility                      PT Education - 08/26/19 1326    Education Details exercise technique, body mechanics    Person(s) Educated Patient    Methods Explanation;Demonstration;Tactile cues;Verbal cues    Comprehension Verbalized understanding;Returned demonstration;Verbal cues required;Tactile cues required            PT Short Term Goals - 07/03/19 1310      PT SHORT TERM GOAL #1   Title Patient will be independent in home exercise program to improve strength/mobility for better functional independence with ADLs.    Baseline 3/3 HEP next session 4/12 HEP compliant 4/28: compliant; 5/24: HEP compliant 6/23 HEp compliant    Time 2    Period Weeks    Status Achieved      PT SHORT TERM GOAL #2   Title Patient will ambulate for 6 minutes without seated rest break to increase tolerance for 6 minute walk test    Baseline 3/3: only able to ambulate 3 minutes and 59 seconds prior to rest break 4/12 6 min walk test deferred till next session 4/14: 6 minutes with one rest break 5/24: 10 seconds from full 6 minutes 6/23: unable to perform this session    Time 2    Period Weeks    Status On-going    Target Date 07/17/19             PT Long Term Goals - 07/03/19 1357      PT LONG TERM GOAL #1   Title Patient will increase six minute walk test distance to >1000 with LRAD for progression to community ambulator and improve gait ability     Baseline 3/3: 700 ft, terminated early 4/12: deferred due to walking down to PT gym 4/14: 740 ft 6 minutes with one seated rest break 5/24: 800 ft with [facility] 6/23: 600 ft terminated due to back pain    Time 8    Period Weeks    Status On-going    Target Date 08/28/19      PT LONG TERM GOAL #2   Title Patient will increase FOTO score to equal to or greater than 57/100 to demonstrate statistically significant improved mobility and quality of life.  Baseline 3/3: 47/100 risk adjusted 43/100 4/12: 41/100 4/28: 50/100 5/24: 46% 6/23: 47%    Time 8    Period Weeks    Status Partially Met    Target Date 08/28/19      PT LONG TERM GOAL #3   Title Patient will increase Berg Balance score by > 6 points (47/56) to demonstrate decreased fall risk during functional activities.    Baseline 3/3: 41/56 4/12; 49/56    Time 8    Period Weeks    Status Achieved      PT LONG TERM GOAL #4   Title Patient will increase BLE gross strength to 4+/5 as to improve functional strength for independent gait, increased standing tolerance and increased ADL ability.    Baseline 3/3: LLE grossly 4-/5 R grossly 3+ hip 4- knee 4/12: LLE grossly 4- to 4/5 with R 4- hip 6/23: did not tolerate resistance due to back pain    Time 8    Period Weeks    Status Partially Met    Target Date 08/28/19      PT LONG TERM GOAL #5   Title Patient will increase dynamic gait index score to >19/24 as to demonstrate reduced fall risk and improved dynamic gait balance for better safety with community/home ambulation.    Baseline 4/28: 14/24 5/24: 17/24 6/24: 20/24    Time 8    Period Weeks    Status Achieved      PT LONG TERM GOAL #6   Title Patient will be mod I for negotiating curb with LRAD while wearing prosthetic for safe community ambulation.    Baseline needs assistance at this time    Time 8    Period Weeks    Status New    Target Date 08/29/19                 Plan - 08/26/19 1342    Clinical Impression  Statement Patient is improving with stability on unstable surfaces as well as decrease reliance upon UE support with dynamic mobility. Occasional rest breaks due to fatigue and overheating required. He is aware next session will be a recert and will have to perform goals. The patient would benefit from further skilled PT intervention to maximize QOL, safety, and functional mobility    Personal Factors and Comorbidities Age;Comorbidity 3+;Finances;Fitness;Past/Current Experience;Social Background;Time since onset of injury/illness/exacerbation;Transportation    Comorbidities DM, heart murmur, HLD, HTN, sleep apnea, cellulitis of L foot, hypomagnesemia, unilateral complete BKA R (2019), stage III CKD, PAD, legally blind, hypokalemia, AKI, and sepsis    Examination-Activity Limitations Bathing;Bed Mobility;Caring for [employer];Locomotion Level;Lift;Stand;Toileting;Transfers    Examination-Participation Restrictions Church;Cleaning;Community Activity;Driving;Interpersonal Relationship;Laundry;Shop;Meal Prep;Yard 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 complexity    Rehab Potential Fair    PT Frequency 2x / week    PT Duration 8 weeks    PT Treatment/Interventions ADLs/Self Care Home Management;Cryotherapy;Electrical Stimulation;Ultrasound;Traction;Moist Heat;DME Instruction;Gait training;Stair training;Functional mobility training;Therapeutic activities;Therapeutic exercise;Patient/family education;Neuromuscular re-education;Balance training;Prosthetic Training;Wheelchair mobility training;Manual techniques;Manual lymph drainage;Compression bandaging;Taping;Energy conservation;Passive range of motion;Aquatic Therapy;Vestibular    PT Next Visit Plan walk on beach    Consulted and Agree with Plan of Care Patient           Patient will benefit from skilled therapeutic intervention in order to improve the following deficits and impairments:   Abnormal gait, Decreased activity tolerance, Decreased balance, Decreased knowledge of precautions, Decreased endurance, Decreased knowledge of use of DME, Decreased mobility, Decreased range of motion, Difficulty walking, Decreased strength, Increased  edema, Impaired flexibility, Impaired perceived functional ability, Prosthetic Dependency, Postural dysfunction, Improper body mechanics, Pain, Decreased skin integrity, Impaired vision/preception, Impaired sensation  Visit Diagnosis: Muscle weakness (generalized)  Unsteadiness on feet  Other abnormalities of gait and mobility  Unilateral complete BKA, right, sequela ([place])     Problem List Patient Active Problem List   Diagnosis Date Noted  . Elevated lactic acid level   . Diabetic ketoacidosis without coma associated with type 2 diabetes mellitus ([place])   . Cellulitis of left foot 12/17/2018  . Pain due to onychomycosis of toenail of right foot 07/05/2018  . Abnormality of gait 10/12/2017  . Type 2 diabetes mellitus with hyperglycemia, with long-term current use of insulin ([place])   . Flatulence   . Hypomagnesemia   . Unilateral complete BKA, right, sequela ([place])   . Benign essential HTN   . Hypoalbuminemia due to protein-calorie malnutrition ([place])   . S/P below knee amputation, right ([place]) 04/12/2017  . Acute blood loss anemia   . Other encephalopathy 04/08/2017  . Encephalopathy 04/08/2017  . Altered mental status 04/08/2017  . Labile blood pressure   . Labile blood glucose   . Drug induced constipation   . Stage 3 chronic kidney disease   . Bacteremia   . S/P unilateral BKA (below knee amputation), right ([place]) 04/04/2017  . PAD (peripheral artery disease) ([place])   . Type 2 diabetes mellitus with right diabetic foot ulcer ([place])   . Post-operative pain   . Legally blind   . Upper GI bleed   . Streptococcal bacteremia 04/01/2017  . Hypokalemia 03/30/2017  . Uncontrolled type 2 diabetes mellitus with hyperglycemia, with  long-term current use of insulin ([place]) 03/30/2017  . Type 2 diabetes mellitus with peripheral neuropathy ([place]) 03/30/2017  . AKI (acute kidney injury) ([place]) 03/30/2017  . CKD stage 3 due to type 2 diabetes mellitus ([place]) 03/30/2017  . Sepsis ([place]) 03/30/2017  . Heart murmur 11/22/2016  . Hyperlipidemia associated with type 2 diabetes mellitus ([place]) 11/22/2016  . Obstructive sleep apnea syndrome 11/22/2016  . Essential hypertension 12/17/2012   Janna Arch, PT, DPT   08/26/2019, 2:45 PM  [place] MAIN [facility] SERVICES [address] [place], [place], [address] Phone: [phone]   Fax:  [phone]  Name: Trevor [name] MRN: [id number] Date of Birth: 27-Jun-1955

## 2019-08-28 ENCOUNTER — Ambulatory Visit: Payer: Medicare Other

## 2019-08-28 ENCOUNTER — Other Ambulatory Visit: Payer: Self-pay

## 2019-08-28 DIAGNOSIS — M6281 Muscle weakness (generalized): Secondary | ICD-10-CM

## 2019-08-28 DIAGNOSIS — S88111S Complete traumatic amputation at level between knee and ankle, right lower leg, sequela: Secondary | ICD-10-CM

## 2019-08-28 DIAGNOSIS — R2689 Other abnormalities of gait and mobility: Secondary | ICD-10-CM

## 2019-08-28 DIAGNOSIS — R5383 Other fatigue: Secondary | ICD-10-CM

## 2019-08-28 DIAGNOSIS — R2681 Unsteadiness on feet: Secondary | ICD-10-CM

## 2019-08-28 NOTE — Therapy (Signed)
[place] MAIN [facility] SERVICES [address] [place], [place], [address] Phone: [phone]   Fax:  [phone]  Physical Therapy Treatment  Patient Details  Name: Trevor [name] MRN: [id number] Date of Birth: Nov 18, 1955 Referring Provider (PT): Jamse Arn, DR   Encounter Date: 08/28/2019   PT End of Session - 08/28/19 1634    Visit Number 39    Number of Visits 58    Date for PT Re-Evaluation 10/23/19    Authorization Time Period 08/28/19-10/23/19    PT Start Time 1302    PT Stop Time 1342    PT Time Calculation (min) 40 min    Equipment Utilized During Treatment Gait belt;Other (comment)    Activity Tolerance Patient limited by fatigue;Patient tolerated treatment well    Behavior During Therapy [facility] for tasks assessed/performed           Past Medical History:  Diagnosis Date   COVID-29 Dec 2018   Diabetes mellitus without complication ([place])    Heart murmur    Hyperlipidemia    Hypertension    Sleep apnea     Past Surgical History:  Procedure Laterality Date   AMPUTATION Right 03/31/2017   Procedure: RIGHT FOOT 1ST AND 2ND RAY AMPUTATION;  Surgeon: Newt Minion, MD;  Location: [place];  Service: Orthopedics;  Laterality: Right;   AMPUTATION Right 04/01/2017   Procedure: AMPUTATION BELOW KNEE;  Surgeon: Newt Minion, MD;  Location: [place];  Service: Orthopedics;  Laterality: Right;   COLONOSCOPY WITH PROPOFOL N/A 10/02/2017   Procedure: COLONOSCOPY WITH PROPOFOL;  Surgeon: [name], MD;  Location: [facility] ENDOSCOPY;  Service: Gastroenterology;  Laterality: N/A;   CORNEAL TRANSPLANT      There were no vitals filed for this visit.   Subjective Assessment - 08/28/19 1309    Subjective Pt reports he continues to progress in general, feels like household mobility conitnues to improve, He would like to be able to go to the beach, walk in sand, get into the pool.    Pertinent History Patient returning to PT after  multiple hospitalizations for L foot and COVID-19. Received home health therapy till 02/14/19  . Patient last seen 12/13/2018 in this outpatient clinic. PMH includes DM, heart murmur, HLD, HTN, sleep apnea, cellulitis of L foot, hypomagnesemia, unilateral complete BKA R (2019), stage III CKD, PAD, legally blind, hypokalemia, AKI, and sepsis. Uses wheelchair primarily at home rather than walking with prosthesis.    Limitations Standing;Walking;House hold activities;Other (comment);Lifting    How long can you sit comfortably? hours /na/    How long can you stand comfortably? 15 minutes with UE support    How long can you walk comfortably? walked in the house    Patient Stated Goals walk better.    Currently in Pain? No/denies              [facility] PT Assessment - 08/28/19 0001      Assessment   Medical Diagnosis R BKA    Referring Provider (PT) Ankit, Lorie Phenix, DR    Onset Date/Surgical Date 04/01/17    Hand Dominance Left    Prior Therapy Yes      Precautions   Precautions Fall      Balance Screen   Has the patient fallen in the past 6 months No    Has the patient had a decrease in activity level because of a fear of falling?  No    Is the  patient reluctant to leave their home because of a fear of falling?  No      Prior Function   Level of Independence Independent;Independent with community mobility with device;Independent with household mobility with device      Ambulation/Gait   Ambulation Distance (Feet) 900 Feet    Assistive device Straight cane   for latter half of walk   Ambulation Surface Level;Indoor    Gait velocity --   1.47ms   Gait Comments unable to complete full 6 minutes due to back pain            INTERVENTION THIS DATE: -square stepping on red foam surface, SPC, 4 laps around red mat, alternating stepping direction; 1 significant LOB requiring minA  -discussed needs to prepare AMB on beach in 4 weeks, as well as potential to get into/out of pool *will  proceed with more grass/mulch surfaces in future, and hopefully to loose sand.     PT Short Term Goals - 08/28/19 1326      PT SHORT TERM GOAL #2   Title Patient will ambulate for 6 minutes without seated rest break to increase tolerance for 6 minute walk test    Baseline 3/3: only able to ambulate 3 minutes and 59 seconds prior to rest break 4/12 6 min walk test deferred till next session 4/14: 6 minutes with one rest break 5/24: 10 seconds from full 6 minutes 6/23: unable to perform this session    Time 2    Period Weeks    Status On-going    Target Date 07/17/19      PT SHORT TERM GOAL #3   Title Patient will ambulate 10 meters with least assistive device and prosthesis to improve mobility in home.     Baseline 8/14: not walking outside of // bars yet  8/28: 63 seconds with RW and CGA     Time 2    Period Weeks    Status Achieved    Target Date 09/06/17      PT SHORT TERM GOAL #4   Title Patient will perform STS with single UE support to decrease reliance upon UE's for stability     Baseline patient requires BUE support 11/5: 1 UR support    Time 2    Period Months    Status Achieved    Target Date 11/02/17             PT Long Term Goals - 08/28/19 1330      PT LONG TERM GOAL #1   Title Patient will increase six minute walk test distance to >1000 with LRAD for progression to community ambulator and improve gait ability    Baseline 3/3: 700 ft, terminated early 4/12: deferred due to walking down to PT gym 4/14: 740 ft 6 minutes with one seated rest break 5/24: [facility]: 600 ft terminated due to back pain; 8/18: 9072fin 4'15" (prior)    Time 8    Period Weeks    Status On-going    Target Date 08/28/19      PT LONG TERM GOAL #2   Title Patient will increase FOTO score to equal to or greater than 57/100 to demonstrate statistically significant improved mobility and quality of life.    Baseline 3/3: 47/100 risk adjusted 43/100 4/12: 41/100 4/28: 50/100 5/24:  46% 6/23: 47%; 8/18:60%    Time 8    Period Weeks    Status Achieved    Target Date 08/28/19  PT LONG TERM GOAL #3   Title Patient will increase Berg Balance score by > 6 points (47/56) to demonstrate decreased fall risk during functional activities.    Baseline 3/3: 41/56 4/12; 49/56    Time 8    Period Weeks    Status Achieved    Target Date 05/08/19      PT LONG TERM GOAL #4   Title Patient will increase BLE gross strength to 4+/5 as to improve functional strength for independent gait, increased standing tolerance and increased ADL ability.    Baseline 3/3: LLE grossly 4-/5 R grossly 3+ hip 4- knee 4/12: LLE grossly 4- to 4/5 with R 4- hip 6/23: did not tolerate resistance due to back pain    Time 8    Period Weeks    Status Partially Met    Target Date 08/28/19      PT LONG TERM GOAL #5   Title Patient will increase dynamic gait index score to >19/24 as to demonstrate reduced fall risk and improved dynamic gait balance for better safety with community/home ambulation.    Baseline 4/28: 14/24 5/24: 17/24 6/24: 20/24    Time 8    Period Weeks    Status Achieved    Target Date 07/03/19      Additional Long Term Goals   Additional Long Term Goals Yes      PT LONG TERM GOAL #6   Title Patient will be mod I for negotiating curb with LRAD while wearing prosthetic for safe community ambulation.    Baseline needs assistance at this time    Time 8    Period Weeks    Status On-going    Target Date 10/23/19                 Plan - 08/28/19 1636    Clinical Impression Statement Reassessment this date, pt demonstrate great improvement in FOTO score and his gait speed/quality. Pt continues to be limited in AMB distance due to chronic low back pain issues, this tolerance has not improve temporally, and unfortunately makes  a6 minutes walk test a limited tool, as patient is able to meet distance goals now, but still does not tolerate more than 4.25 minutes AMB. Pt still needs  assist with AMB in multiple directions and soft surfaces, which will be a strong focus moving forward in preparation for trip to the beach next month. Pt alsointerested in gettng into pool, which will require good mastery of floor to stnading transitions.    Personal Factors and Comorbidities Age;Comorbidity 3+;Finances;Fitness;Past/Current Experience;Social Background;Time since onset of injury/illness/exacerbation;Transportation    Comorbidities DM, heart murmur, HLD, HTN, sleep apnea, cellulitis of L foot, hypomagnesemia, unilateral complete BKA R (2019), stage III CKD, PAD, legally blind, hypokalemia, AKI, and sepsis    Examination-Activity Limitations Bathing;Bed Mobility;Caring for [employer];Locomotion Level;Lift;Stand;Toileting;Transfers    Examination-Participation Restrictions Church;Cleaning;Community Activity;Driving;Interpersonal Relationship;Laundry;Shop;Meal Prep;Yard 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 complexity    Clinical Decision Making Moderate    Rehab Potential Good    Clinical Impairments Affecting Rehab Potential (+) previous independence, motivation to return to walking (-) lives alone, hx of diabetes, limited vision     PT Frequency 2x / week    PT Duration 8 weeks    PT Treatment/Interventions ADLs/Self Care Home Management;Cryotherapy;Electrical Stimulation;Ultrasound;Traction;Moist Heat;DME Instruction;Gait training;Stair training;Functional mobility training;Therapeutic activities;Therapeutic exercise;Patient/family education;Neuromuscular re-education;Balance training;Prosthetic Training;Wheelchair mobility training;Manual techniques;Manual lymph drainage;Compression bandaging;Taping;Energy conservation;Passive range of motion;Aquatic Therapy;Vestibular    PT Next Visit Plan walk on  beach    PT Home Exercise Plan see [id number] on medbridge, spc ambulation    Consulted and Agree with Plan of Care Patient            Patient will benefit from skilled therapeutic intervention in order to improve the following deficits and impairments:  Abnormal gait, Decreased activity tolerance, Decreased balance, Decreased knowledge of precautions, Decreased endurance, Decreased knowledge of use of DME, Decreased mobility, Decreased range of motion, Difficulty walking, Decreased strength, Increased edema, Impaired flexibility, Impaired perceived functional ability, Prosthetic Dependency, Postural dysfunction, Improper body mechanics, Pain, Decreased skin integrity, Impaired vision/preception, Impaired sensation  Visit Diagnosis: Muscle weakness (generalized) - Plan: PT plan of care cert/re-cert  Unsteadiness on feet - Plan: PT plan of care cert/re-cert  Other abnormalities of gait and mobility - Plan: PT plan of care cert/re-cert  Unilateral complete BKA, right, sequela ([place]) - Plan: PT plan of care cert/re-cert  Lethargy - Plan: PT plan of care cert/re-cert     Problem List Patient Active Problem List   Diagnosis Date Noted   Elevated lactic acid level    Diabetic ketoacidosis without coma associated with type 2 diabetes mellitus ([place])    Cellulitis of left foot 12/17/2018   Pain due to onychomycosis of toenail of right foot 07/05/2018   Abnormality of gait 10/12/2017   Type 2 diabetes mellitus with hyperglycemia, with long-term current use of insulin ([facility])    Flatulence    Hypomagnesemia    Unilateral complete BKA, right, sequela ([facility])    Benign essential HTN    Hypoalbuminemia due to protein-calorie malnutrition ([place])    S/P below knee amputation, right ([place]) 04/12/2017   Acute blood loss anemia    Other encephalopathy 04/08/2017   Encephalopathy 04/08/2017   Altered mental status 04/08/2017   Labile blood pressure    Labile blood glucose    Drug induced constipation    Stage 3 chronic kidney disease    Bacteremia    S/P unilateral BKA (below knee amputation), right  ([place]) 04/04/2017   PAD (peripheral artery disease) ([place])    Type 2 diabetes mellitus with right diabetic foot ulcer ([place])    Post-operative pain    Legally blind    Upper GI bleed    Streptococcal bacteremia 04/01/2017   Hypokalemia 03/30/2017   Uncontrolled type 2 diabetes mellitus with hyperglycemia, with long-term current use of insulin ([place]) 03/30/2017   Type 2 diabetes mellitus with peripheral neuropathy ([place]) 03/30/2017   AKI (acute kidney injury) ([place]) 03/30/2017   CKD stage 3 due to type 2 diabetes mellitus ([place]) 03/30/2017   Sepsis ([place]) 03/30/2017   Heart murmur 11/22/2016   Hyperlipidemia associated with type 2 diabetes mellitus ([place]) 11/22/2016   Obstructive sleep apnea syndrome 11/22/2016   Essential hypertension 12/17/2012   4:39 PM, 08/28/19 Etta Grandchild, PT, DPT Physical Therapist - [place] [phone]     Rachel C 08/28/2019, 4:39 PM  [facility] [address] [place], [place], [address] Phone: [phone]   Fax:  [phone]  Name: KENNETT SYMES MRN: [id number] Date of Birth: 30-Sep-1955

## 2019-09-02 ENCOUNTER — Other Ambulatory Visit: Payer: Self-pay

## 2019-09-02 ENCOUNTER — Ambulatory Visit: Payer: Medicare Other

## 2019-09-02 DIAGNOSIS — M6281 Muscle weakness (generalized): Secondary | ICD-10-CM | POA: Diagnosis not present

## 2019-09-02 DIAGNOSIS — R2689 Other abnormalities of gait and mobility: Secondary | ICD-10-CM

## 2019-09-02 DIAGNOSIS — R2681 Unsteadiness on feet: Secondary | ICD-10-CM

## 2019-09-02 DIAGNOSIS — S88111S Complete traumatic amputation at level between knee and ankle, right lower leg, sequela: Secondary | ICD-10-CM

## 2019-09-02 NOTE — Therapy (Signed)
[place] MAIN [facility] SERVICES [place], [place], [address] Phone: [phone]   Fax:  [phone]  Physical Therapy Treatment Physical Therapy Progress Note   Dates of reporting period  07/22/19   to   09/02/19  Patient Details  Name: Trevor [name] MRN: [id number] Date of Birth: [birth year] Referring Provider (PT): Jamse Arn, DR   Encounter Date: 09/02/2019   PT End of Session - 09/02/19 1326    Visit Number 40    Number of Visits 83    Date for PT Re-Evaluation 10/23/19    Authorization Type next session 1/10 PN 09/02/19    Authorization Time Period 08/28/19-10/23/19    PT Start Time 1301    PT Stop Time 1343    PT Time Calculation (min) 42 min    Equipment Utilized During Treatment Gait belt;Other (comment)    Activity Tolerance Patient limited by fatigue;Patient tolerated treatment well    Behavior During Therapy [facility] for tasks assessed/performed           Past Medical History:  Diagnosis Date  . COVID-29 Dec 2018  . Diabetes mellitus without complication ([place])   . Heart murmur   . Hyperlipidemia   . Hypertension   . Sleep apnea     Past Surgical History:  Procedure Laterality Date  . AMPUTATION Right 03/31/2017   Procedure: RIGHT FOOT 1ST AND 2ND RAY AMPUTATION;  Surgeon: Newt Minion, MD;  Location: [place];  Service: Orthopedics;  Laterality: Right;  . AMPUTATION Right 04/01/2017   Procedure: AMPUTATION BELOW KNEE;  Surgeon: Newt Minion, MD;  Location: [place];  Service: Orthopedics;  Laterality: Right;  . COLONOSCOPY WITH PROPOFOL N/A 10/02/2017   Procedure: COLONOSCOPY WITH PROPOFOL;  Surgeon: [name], MD;  Location: [facility] END[facility]COPY;  Service: Gastroenterology;  Laterality: N/A;  . CORNEAL TRANSPLANT      There were no vitals filed for this visit.   Subjective Assessment - 09/02/19 1324    Subjective Patient reports no falls since last session. Was able to stand at church and lead songs.  Would like to be able to walk on unstable surfaces.    Pertinent History Patient returning to PT after multiple hospitalizations for L foot and COVID-19. Received home health therapy till 02/14/19  . Patient last seen 12/13/2018 in this outpatient clinic. PMH includes DM, heart murmur, HLD, HTN, sleep apnea, cellulitis of L foot, hypomagnesemia, unilateral complete BKA R (2019), stage III CKD, PAD, legally blind, hypokalemia, AKI, and sepsis. Uses wheelchair primarily at home rather than walking with prosthesis.    Limitations Standing;Walking;House hold activities;Other (comment);Lifting    How long can you sit comfortably? hours /na/    How long can you stand comfortably? 15 minutes with UE support    How long can you walk comfortably? walked in the house    Patient Stated Goals walk better.    Currently in Pain? No/denies             vitals 144/75   Progress note, goals performed session prior     Standing: close CGA with focus on stability     ambulate >1000 ft with no rest breaks without SPC close CGA, cues for gait mechanics, arm swing, weight shift and posture. Additional cues for negotiation of turns, people, and obstacles.        by support bar; CGA with cues for safety and sequencing.  -airex pad marching no UE support 12x each  LE  -cone taps with SUE to no UE support 10x each LE (3 cone sequence); very challenging for prosthetic limb in regards to force and position in space  -standing hamstring stretch 2x 30 seconds each LE  -modified posterior lunge step with no UE support 10x each LE   four square, forward, lateral, backwards movements with CGA     Seated:  -seated volleyball with upright posture for pertubations, stability and core activation.  -gluteal squeeze 10x 3 second holds  -hamstring stretch 2x 30 seconds each LE  -TrA contraction 10x 3 second holds -BTB adduction 15x each LE against PT resistance   Pt educated throughout session about proper posture and  technique with exercises. Improved exercise technique, movement at target joints, use of target muscles after min to mod verbal, visual, tactile cues.     Patient's condition has the potential to improve in response to therapy. Maximum improvement is yet to be obtained. The anticipated improvement is attainable and reasonable in a generally predictable time.  Patient reports he is improving in ability to move and walk but is limited by back pain and unstable surfaces.                       PT Education - 09/02/19 1325    Education Details exercise technique, body mechanics    Person(s) Educated Patient    Methods Explanation;Demonstration;Tactile cues;Verbal cues    Comprehension Verbalized understanding;Returned demonstration;Verbal cues required;Tactile cues required            PT Short Term Goals - 08/28/19 1326      PT SHORT TERM GOAL #2   Title Patient will ambulate for 6 minutes without seated rest break to increase tolerance for 6 minute walk test    Baseline 3/3: only able to ambulate 3 minutes and 59 seconds prior to rest break 4/12 6 min walk test deferred till next session 4/14: 6 minutes with one rest break 5/24: 10 seconds from full 6 minutes 6/23: unable to perform this session    Time 2    Period Weeks    Status On-going    Target Date 07/17/19      PT SHORT TERM GOAL #3   Title Patient will ambulate 10 meters with least assistive device and prosthesis to improve mobility in home.     Baseline 8/14: not walking outside of // bars yet  8/28: 63 seconds with RW and CGA     Time 2    Period Weeks    Status Achieved    Target Date 09/06/17      PT SHORT TERM GOAL #4   Title Patient will perform STS with single UE support to decrease reliance upon UE's for stability     Baseline patient requires BUE support 11/5: 1 UR support    Time 2    Period Months    Status Achieved    Target Date 11/02/17             PT Long Term Goals - 08/28/19 1330       PT LONG TERM GOAL #1   Title Patient will increase six minute walk test distance to >1000 with LRAD for progression to community ambulator and improve gait ability    Baseline 3/3: 700 ft, terminated early 4/12: deferred due to walking down to PT gym 4/14: 740 ft 6 minutes with one seated rest break 5/24: 800 ft with [facility] 6/23: 600 ft terminated due to back  pain; 8/18: 962f in 4'15" (prior)    Time 8    Period Weeks    Status On-going    Target Date 08/28/19      PT LONG TERM GOAL #2   Title Patient will increase FOTO score to equal to or greater than 57/100 to demonstrate statistically significant improved mobility and quality of life.    Baseline 3/3: 47/100 risk adjusted 43/100 4/12: 41/100 4/28: 50/100 5/24: 46% 6/23: 47%; 8/18:60%    Time 8    Period Weeks    Status Achieved    Target Date 08/28/19      PT LONG TERM GOAL #3   Title Patient will increase Berg Balance score by > 6 points (47/56) to demonstrate decreased fall risk during functional activities.    Baseline 3/3: 41/56 4/12; 49/56    Time 8    Period Weeks    Status Achieved    Target Date 05/08/19      PT LONG TERM GOAL #4   Title Patient will increase BLE gross strength to 4+/5 as to improve functional strength for independent gait, increased standing tolerance and increased ADL ability.    Baseline 3/3: LLE grossly 4-/5 R grossly 3+ hip 4- knee 4/12: LLE grossly 4- to 4/5 with R 4- hip 6/23: did not tolerate resistance due to back pain    Time 8    Period Weeks    Status Partially Met    Target Date 08/28/19      PT LONG TERM GOAL #5   Title Patient will increase dynamic gait index score to >19/24 as to demonstrate reduced fall risk and improved dynamic gait balance for better safety with community/home ambulation.    Baseline 4/28: 14/24 5/24: 17/24 6/24: 20/24    Time 8    Period Weeks    Status Achieved    Target Date 07/03/19      Additional Long Term Goals   Additional Long Term Goals Yes       PT LONG TERM GOAL #6   Title Patient will be mod I for negotiating curb with LRAD while wearing prosthetic for safe community ambulation.    Baseline needs assistance at this time    Time 8    Period Weeks    Status On-going    Target Date 10/23/19                 Plan - 09/02/19 1338    Clinical Impression Statement Patient's goals performed last session on 08/28/19 , please refer to this note for further details. Patient continues to demonstrate excellent motivation throughout physical therapy session. Unstable surfaces and single limb stability continue to be limiting factor for patient progression. Patient's condition has the potential to improve in response to therapy. Maximum improvement is yet to be obtained. The anticipated improvement is attainable and reasonable in a generally predictable time.The patient would benefit from further skilled PT intervention to maximize QOL, safety, and functional mobility    Personal Factors and Comorbidities Age;Comorbidity 3+;Finances;Fitness;Past/Current Experience;Social Background;Time since onset of injury/illness/exacerbation;Transportation    Comorbidities DM, heart murmur, HLD, HTN, sleep apnea, cellulitis of L foot, hypomagnesemia, unilateral complete BKA R (2019), stage III CKD, PAD, legally blind, hypokalemia, AKI, and sepsis    Examination-Activity Limitations Bathing;Bed Mobility;Caring for OPitney BowesLocomotion Level;Lift;Stand;Toileting;Transfers    Examination-Participation Restrictions Church;Cleaning;Community Activity;Driving;Interpersonal Relationship;Laundry;Shop;Meal Prep;Yard Work    SMerchant navy officerEvolving/Moderate complexity    Rehab Potential Good    Clinical Impairments Affecting Rehab Potential (+)  previous independence, motivation to return to walking (-) lives alone, hx of diabetes, limited vision     PT Frequency 2x / week    PT Duration 8 weeks    PT  Treatment/Interventions ADLs/Self Care Home Management;Cryotherapy;Electrical Stimulation;Ultrasound;Traction;Moist Heat;DME Instruction;Gait training;Stair training;Functional mobility training;Therapeutic activities;Therapeutic exercise;Patient/family education;Neuromuscular re-education;Balance training;Prosthetic Training;Wheelchair mobility training;Manual techniques;Manual lymph drainage;Compression bandaging;Taping;Energy conservation;Passive range of motion;Aquatic Therapy;Vestibular    PT Next Visit Plan walk on beach    PT Home Exercise Plan see [id number] on medbridge, spc ambulation    Consulted and Agree with Plan of Care Patient           Patient will benefit from skilled therapeutic intervention in order to improve the following deficits and impairments:  Abnormal gait, Decreased activity tolerance, Decreased balance, Decreased knowledge of precautions, Decreased endurance, Decreased knowledge of use of DME, Decreased mobility, Decreased range of motion, Difficulty walking, Decreased strength, Increased edema, Impaired flexibility, Impaired perceived functional ability, Prosthetic Dependency, Postural dysfunction, Improper body mechanics, Pain, Decreased skin integrity, Impaired vision/preception, Impaired sensation  Visit Diagnosis: Muscle weakness (generalized)  Unsteadiness on feet  Other abnormalities of gait and mobility  Unilateral complete BKA, right, sequela ([place])     Problem List Patient Active Problem List   Diagnosis Date Noted  . Elevated lactic acid level   . Diabetic ketoacidosis without coma associated with type 2 diabetes mellitus ([place])   . Cellulitis of left foot 12/17/2018  . Pain due to onychomycosis of toenail of right foot 07/05/2018  . Abnormality of gait 10/12/2017  . Type 2 diabetes mellitus with hyperglycemia, with long-term current use of insulin ([place])   . Flatulence   . Hypomagnesemia   . Unilateral complete BKA, right, sequela ([place])   .  Benign essential HTN   . Hypoalbuminemia due to protein-calorie malnutrition ([place])   . S/P below knee amputation, right ([place]) 04/12/2017  . Acute blood loss anemia   . Other encephalopathy 04/08/2017  . Encephalopathy 04/08/2017  . Altered mental status 04/08/2017  . Labile blood pressure   . Labile blood glucose   . Drug induced constipation   . Stage 3 chronic kidney disease   . Bacteremia   . S/P unilateral BKA (below knee amputation), right ([place]) 04/04/2017  . PAD (peripheral artery disease) ([place])   . Type 2 diabetes mellitus with right diabetic foot ulcer ([place])   . Post-operative pain   . Legally blind   . Upper GI bleed   . Streptococcal bacteremia 04/01/2017  . Hypokalemia 03/30/2017  . Uncontrolled type 2 diabetes mellitus with hyperglycemia, with long-term current use of insulin ([place]) 03/30/2017  . Type 2 diabetes mellitus with peripheral neuropathy ([place]) 03/30/2017  . AKI (acute kidney injury) ([place]) 03/30/2017  . CKD stage 3 due to type 2 diabetes mellitus ([place]) 03/30/2017  . Sepsis ([place]) 03/30/2017  . Heart murmur 11/22/2016  . Hyperlipidemia associated with type 2 diabetes mellitus ([place]) 11/22/2016  . Obstructive sleep apnea syndrome 11/22/2016  . Essential hypertension 12/17/2012   Janna Arch, PT, DPT   09/02/2019, 1:44 PM  [place] MAIN [facility] SERVICES [address] [place], [place], [address] Phone: [phone]   Fax:  [phone]  Name: Trevor [name] MRN: [id number] Date of Birth: 04-02-1955

## 2019-09-04 ENCOUNTER — Ambulatory Visit: Payer: Medicare Other

## 2019-09-04 ENCOUNTER — Other Ambulatory Visit: Payer: Self-pay

## 2019-09-04 DIAGNOSIS — S88111S Complete traumatic amputation at level between knee and ankle, right lower leg, sequela: Secondary | ICD-10-CM

## 2019-09-04 DIAGNOSIS — R2681 Unsteadiness on feet: Secondary | ICD-10-CM

## 2019-09-04 DIAGNOSIS — M6281 Muscle weakness (generalized): Secondary | ICD-10-CM

## 2019-09-04 DIAGNOSIS — R2689 Other abnormalities of gait and mobility: Secondary | ICD-10-CM

## 2019-09-04 NOTE — Therapy (Signed)
[place] MAIN [facility] SERVICES [address] [place], [place], [address] Phone: [phone]   Fax:  [phone]  Physical Therapy Treatment  Patient Details  Name: Trevor [name] MRN: [id number] Date of Birth: 1956/01/07 Referring Provider (PT): Jamse Arn, DR   Encounter Date: 09/04/2019   PT End of Session - 09/04/19 1259    Visit Number 41    Number of Visits 29    Date for PT Re-Evaluation 10/23/19    Authorization Type 1/10 PN 09/02/19    Authorization Time Period 08/28/19-10/23/19    PT Start Time 1259    PT Stop Time 1343    PT Time Calculation (min) 44 min    Equipment Utilized During Treatment Gait belt;Other (comment)    Activity Tolerance Patient limited by fatigue;Patient tolerated treatment well    Behavior During Therapy [facility] for tasks assessed/performed           Past Medical History:  Diagnosis Date   COVID-29 Dec 2018   Diabetes mellitus without complication ([place])    Heart murmur    Hyperlipidemia    Hypertension    Sleep apnea     Past Surgical History:  Procedure Laterality Date   AMPUTATION Right 03/31/2017   Procedure: RIGHT FOOT 1ST AND 2ND RAY AMPUTATION;  Surgeon: Newt Minion, MD;  Location: [place];  Service: Orthopedics;  Laterality: Right;   AMPUTATION Right 04/01/2017   Procedure: AMPUTATION BELOW KNEE;  Surgeon: Newt Minion, MD;  Location: [place];  Service: Orthopedics;  Laterality: Right;   COLONOSCOPY WITH PROPOFOL N/A 10/02/2017   Procedure: COLONOSCOPY WITH PROPOFOL;  Surgeon: [name], MD;  Location: [facility]COPY;  Service: Gastroenterology;  Laterality: N/A;   CORNEAL TRANSPLANT      There were no vitals filed for this visit.   Subjective Assessment - 09/04/19 1317    Subjective Patient reports fatigue from heat and not sleeping well. No falls or LOB since last session.    Pertinent History Patient returning to PT after multiple hospitalizations for L foot and  COVID-19. Received home health therapy till 02/14/19  . Patient last seen 12/13/2018 in this outpatient clinic. PMH includes DM, heart murmur, HLD, HTN, sleep apnea, cellulitis of L foot, hypomagnesemia, unilateral complete BKA R (2019), stage III CKD, PAD, legally blind, hypokalemia, AKI, and sepsis. Uses wheelchair primarily at home rather than walking with prosthesis.    Limitations Standing;Walking;House hold activities;Other (comment);Lifting    How long can you sit comfortably? hours /na/    How long can you stand comfortably? 15 minutes with UE support    How long can you walk comfortably? walked in the house    Patient Stated Goals walk better.    Currently in Pain? No/denies           Vitals at start of session: 130/68     Standing in // bars: CGA for safety and stability  airex beam: lateral stepping 4x length of // bars; one near LOB  Backwards ambulation 4x length of // bars ; no UE support, cues for gluteal activation challenging for patient to perform equal step length   Large step clap for stabilization with increased step length. 10x each LE placement ; two near LOB   airex pad : two different colors, foot on each for modified tandem stance 2x 60 seconds   Balloon taps reaching inside/outside BOS for reaction timing, pertubation, and sequencing of muscle activation x 3 minutes  5lb ankle weights -lateral stepping with focus on foot clearance and exaggerated step 4x length of // bars; no UE support.  -high knee march 4x length of // bars, no UE support -backwards ambulation 4x length of // bars, no UE support  200 ft ambulation w/o SPC with close CGA and cues for body mechanics/sequencing  seated  10x STS from lowered plinth table Alternating LAQ with upright posture without back support 10x each LE 4" step alternating toe taps 30 seconds for coordination, sequencing, and spatial awareness     Pt educated throughout session about proper posture and technique with  exercises. Improved exercise technique, movement at target joints, use of target muscles after min to mod verbal, visual, tactile cues.                      PT Education - 09/04/19 1259    Education Details exercise technique, body mechanics    Person(s) Educated Patient    Methods Explanation;Demonstration;Tactile cues;Verbal cues    Comprehension Verbalized understanding;Returned demonstration;Verbal cues required;Tactile cues required            PT Short Term Goals - 08/28/19 1326      PT SHORT TERM GOAL #2   Title Patient will ambulate for 6 minutes without seated rest break to increase tolerance for 6 minute walk test    Baseline 3/3: only able to ambulate 3 minutes and 59 seconds prior to rest break 4/12 6 min walk test deferred till next session 4/14: 6 minutes with one rest break 5/24: 10 seconds from full 6 minutes 6/23: unable to perform this session    Time 2    Period Weeks    Status On-going    Target Date 07/17/19      PT SHORT TERM GOAL #3   Title Patient will ambulate 10 meters with least assistive device and prosthesis to improve mobility in home.     Baseline 8/14: not walking outside of // bars yet  8/28: 63 seconds with RW and CGA     Time 2    Period Weeks    Status Achieved    Target Date 09/06/17      PT SHORT TERM GOAL #4   Title Patient will perform STS with single UE support to decrease reliance upon UE's for stability     Baseline patient requires BUE support 11/5: 1 UR support    Time 2    Period Months    Status Achieved    Target Date 11/02/17             PT Long Term Goals - 08/28/19 1330      PT LONG TERM GOAL #1   Title Patient will increase six minute walk test distance to >1000 with LRAD for progression to community ambulator and improve gait ability    Baseline 3/3: 700 ft, terminated early 4/12: deferred due to walking down to PT gym 4/14: 740 ft 6 minutes with one seated rest break 5/24: 800 ft with [facility] 6/23: 600  ft terminated due to back pain; 8/18: 968f in 4'15" (prior)    Time 8    Period Weeks    Status On-going    Target Date 08/28/19      PT LONG TERM GOAL #2   Title Patient will increase FOTO score to equal to or greater than 57/100 to demonstrate statistically significant improved mobility and quality of life.    Baseline 3/3: 47/100 risk adjusted 43/100  4/12: 41/100 4/28: 50/100 5/24: 46% 6/23: 47%; 8/18:60%    Time 8    Period Weeks    Status Achieved    Target Date 08/28/19      PT LONG TERM GOAL #3   Title Patient will increase Berg Balance score by > 6 points (47/56) to demonstrate decreased fall risk during functional activities.    Baseline 3/3: 41/56 4/12; 49/56    Time 8    Period Weeks    Status Achieved    Target Date 05/08/19      PT LONG TERM GOAL #4   Title Patient will increase BLE gross strength to 4+/5 as to improve functional strength for independent gait, increased standing tolerance and increased ADL ability.    Baseline 3/3: LLE grossly 4-/5 R grossly 3+ hip 4- knee 4/12: LLE grossly 4- to 4/5 with R 4- hip 6/23: did not tolerate resistance due to back pain    Time 8    Period Weeks    Status Partially Met    Target Date 08/28/19      PT LONG TERM GOAL #5   Title Patient will increase dynamic gait index score to >19/24 as to demonstrate reduced fall risk and improved dynamic gait balance for better safety with community/home ambulation.    Baseline 4/28: 14/24 5/24: 17/24 6/24: 20/24    Time 8    Period Weeks    Status Achieved    Target Date 07/03/19      Additional Long Term Goals   Additional Long Term Goals Yes      PT LONG TERM GOAL #6   Title Patient will be mod I for negotiating curb with LRAD while wearing prosthetic for safe community ambulation.    Baseline needs assistance at this time    Time 8    Period Weeks    Status On-going    Target Date 10/23/19                 Plan - 09/04/19 1344    Clinical Impression Statement  Patient tolerated dynamic standing interventions with occasional rest breaks. Large step lengths are challenging for stability and require close guarding and cueing for sequencing. Increased ability to ambulate and negotiate turns without use of AD noted this session. The patient would benefit from further skilled PT intervention to maximize QOL, safety, and functional mobility    Personal Factors and Comorbidities Age;Comorbidity 3+;Finances;Fitness;Past/Current Experience;Social Background;Time since onset of injury/illness/exacerbation;Transportation    Comorbidities DM, heart murmur, HLD, HTN, sleep apnea, cellulitis of L foot, hypomagnesemia, unilateral complete BKA R (2019), stage III CKD, PAD, legally blind, hypokalemia, AKI, and sepsis    Examination-Activity Limitations Bathing;Bed Mobility;Caring for [employer];Locomotion Level;Lift;Stand;Toileting;Transfers    Examination-Participation Restrictions Church;Cleaning;Community Activity;Driving;Interpersonal Relationship;Laundry;Shop;Meal Prep;Yard 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 complexity    Rehab Potential Good    Clinical Impairments Affecting Rehab Potential (+) previous independence, motivation to return to walking (-) lives alone, hx of diabetes, limited vision     PT Frequency 2x / week    PT Duration 8 weeks    PT Treatment/Interventions ADLs/Self Care Home Management;Cryotherapy;Electrical Stimulation;Ultrasound;Traction;Moist Heat;DME Instruction;Gait training;Stair training;Functional mobility training;Therapeutic activities;Therapeutic exercise;Patient/family education;Neuromuscular re-education;Balance training;Prosthetic Training;Wheelchair mobility training;Manual techniques;Manual lymph drainage;Compression bandaging;Taping;Energy conservation;Passive range of motion;Aquatic Therapy;Vestibular    PT Next Visit Plan walk on beach    PT Home Exercise Plan see  [id number] on medbridge, spc ambulation    Consulted and Agree with Plan of Care Patient  Patient will benefit from skilled therapeutic intervention in order to improve the following deficits and impairments:  Abnormal gait, Decreased activity tolerance, Decreased balance, Decreased knowledge of precautions, Decreased endurance, Decreased knowledge of use of DME, Decreased mobility, Decreased range of motion, Difficulty walking, Decreased strength, Increased edema, Impaired flexibility, Impaired perceived functional ability, Prosthetic Dependency, Postural dysfunction, Improper body mechanics, Pain, Decreased skin integrity, Impaired vision/preception, Impaired sensation  Visit Diagnosis: Muscle weakness (generalized)  Unsteadiness on feet  Other abnormalities of gait and mobility  Unilateral complete BKA, right, sequela (HCC)     Problem List Patient Active Problem List   Diagnosis Date Noted   Elevated lactic acid level    Diabetic ketoacidosis without coma associated with type 2 diabetes mellitus ([place])    Cellulitis of left foot 12/17/2018   Pain due to onychomycosis of toenail of right foot 07/05/2018   Abnormality of gait 10/12/2017   Type 2 diabetes mellitus with hyperglycemia, with long-term current use of insulin (HCC)    Flatulence    Hypomagnesemia    Unilateral complete BKA, right, sequela (HCC)    Benign essential HTN    Hypoalbuminemia due to protein-calorie malnutrition ([place])    S/P below knee amputation, right ([place]) 04/12/2017   Acute blood loss anemia    Other encephalopathy 04/08/2017   Encephalopathy 04/08/2017   Altered mental status 04/08/2017   Labile blood pressure    Labile blood glucose    Drug induced constipation    Stage 3 chronic kidney disease    Bacteremia    S/P unilateral BKA (below knee amputation), right ([place]) 04/04/2017   PAD (peripheral artery disease) ([place])    Type 2 diabetes mellitus with right  diabetic foot ulcer ([place])    Post-operative pain    Legally blind    Upper GI bleed    Streptococcal bacteremia 04/01/2017   Hypokalemia 03/30/2017   Uncontrolled type 2 diabetes mellitus with hyperglycemia, with long-term current use of insulin ([place]) 03/30/2017   Type 2 diabetes mellitus with peripheral neuropathy ([place]) 03/30/2017   AKI (acute kidney injury) ([place]) 03/30/2017   CKD stage 3 due to type 2 diabetes mellitus ([place]) 03/30/2017   Sepsis ([place]) 03/30/2017   Heart murmur 11/22/2016   Hyperlipidemia associated with type 2 diabetes mellitus ([place]) 11/22/2016   Obstructive sleep apnea syndrome 11/22/2016   Essential hypertension 12/17/2012   Janna Arch, PT, DPT   09/04/2019, 1:52 PM  [place] MAIN [facility] SERVICES [address] [place], [place], [address] Phone: [phone]   Fax:  [phone]  Name: Trevor [name] MRN: [id number] Date of Birth: [birth year]

## 2019-09-09 ENCOUNTER — Ambulatory Visit: Payer: Medicare Other

## 2019-09-11 ENCOUNTER — Other Ambulatory Visit: Payer: Self-pay

## 2019-09-11 ENCOUNTER — Ambulatory Visit: Payer: Medicare Other | Attending: Physical Medicine & Rehabilitation

## 2019-09-11 DIAGNOSIS — R2681 Unsteadiness on feet: Secondary | ICD-10-CM | POA: Insufficient documentation

## 2019-09-11 DIAGNOSIS — R2689 Other abnormalities of gait and mobility: Secondary | ICD-10-CM | POA: Diagnosis present

## 2019-09-11 DIAGNOSIS — M6281 Muscle weakness (generalized): Secondary | ICD-10-CM | POA: Diagnosis present

## 2019-09-11 DIAGNOSIS — S88111S Complete traumatic amputation at level between knee and ankle, right lower leg, sequela: Secondary | ICD-10-CM | POA: Diagnosis present

## 2019-09-11 NOTE — Therapy (Signed)
[place] MAIN [facility] SERVICES [address] [place], [place], [address] Phone: [phone]   Fax:  [phone]  Physical Therapy Treatment  Patient Details  Name: Trevor [name] MRN: [id number] Date of Birth: 03/05/1955 Referring Provider (PT): Jamse Arn, DR   Encounter Date: 09/11/2019   PT End of Session - 09/11/19 1317    Visit Number 42    Number of Visits 58    Date for PT Re-Evaluation 10/23/19    Authorization Type 2/10 PN 09/02/19    Authorization Time Period 08/28/19-10/23/19    PT Start Time 1259    PT Stop Time 1344    PT Time Calculation (min) 45 min    Equipment Utilized During Treatment Gait belt;Other (comment)    Activity Tolerance Patient limited by fatigue;Patient tolerated treatment well    Behavior During Therapy [facility] for tasks assessed/performed           Past Medical History:  Diagnosis Date  . COVID-29 Dec 2018  . Diabetes mellitus without complication ([place])   . Heart murmur   . Hyperlipidemia   . Hypertension   . Sleep apnea     Past Surgical History:  Procedure Laterality Date  . AMPUTATION Right 03/31/2017   Procedure: RIGHT FOOT 1ST AND 2ND RAY AMPUTATION;  Surgeon: Newt Minion, MD;  Location: [place];  Service: Orthopedics;  Laterality: Right;  . AMPUTATION Right 04/01/2017   Procedure: AMPUTATION BELOW KNEE;  Surgeon: Newt Minion, MD;  Location: [place];  Service: Orthopedics;  Laterality: Right;  . COLONOSCOPY WITH PROPOFOL N/A 10/02/2017   Procedure: COLONOSCOPY WITH PROPOFOL;  Surgeon: [name], MD;  Location: [facility] ENDOSCOPY;  Service: Gastroenterology;  Laterality: N/A;  . CORNEAL TRANSPLANT      There were no vitals filed for this visit.   Subjective Assessment - 09/11/19 1303    Subjective Patient missed last session, said it was a bad day, doesn't say why. No falls or LOB since last session.    Pertinent History Patient returning to PT after multiple hospitalizations for L  foot and COVID-19. Received home health therapy till 02/14/19  . Patient last seen 12/13/2018 in this outpatient clinic. PMH includes DM, heart murmur, HLD, HTN, sleep apnea, cellulitis of L foot, hypomagnesemia, unilateral complete BKA R (2019), stage III CKD, PAD, legally blind, hypokalemia, AKI, and sepsis. Uses wheelchair primarily at home rather than walking with prosthesis.    Limitations Standing;Walking;House hold activities;Other (comment);Lifting    How long can you sit comfortably? hours /na/    How long can you stand comfortably? 15 minutes with UE support    How long can you walk comfortably? walked in the house    Patient Stated Goals walk better.    Currently in Pain? No/denies               Vitals at start of session: 148/78   Standing: close CGA with focus on stability   ambulate >600 ft withno rest breaks withoutSPC close CGA, cues for gait mechanics, arm swing, weight shift and posture. Additional cues for negotiation of turns, people, and obstacles.   by support bar; CGA with cues for safety and sequencing.  -Step over and back orange hurdle 10x each LE;single to no UE support; very challenging with no UE support -airex pad marching/6" step toe tap no UE support 12x each LE -airex pad horizontal head turns 20x , vertical head turns 20x cues for core activation.  -  cone taps with SUE support/finger tip support 10x each LE (3 cone sequence); very challenging for prosthetic limb in regards to force and position in space -UE support modifiedlunge BUEsupport with support surface 10x each LE -standing hamstring stretch 2x 30 seconds each LE -standing hip extension 15x each LE   Seated:  -seated volleyball with upright posture for pertubations, stability and core activation.  -gluteal squeeze 10x 3 second holds -hamstring stretch 2x 30 seconds each LE GTB row 15x GTB adduction against PT resistance 15x each LE 10x STS with UE support from low  surface, very challenging for patient Half moon kick 10x each LE    Pt educated throughout session about proper posture and technique with exercises. Improved exercise technique, movement at target joints, use of target muscles after min to mod verbal, visual, tactile cues.                          PT Education - 09/11/19 1316    Education Details exercise technique, body mechanics    Person(s) Educated Patient    Methods Explanation;Demonstration;Tactile cues;Verbal cues    Comprehension Verbalized understanding;Returned demonstration;Verbal cues required;Tactile cues required            PT Short Term Goals - 08/28/19 1326      PT SHORT TERM GOAL #2   Title Patient will ambulate for 6 minutes without seated rest break to increase tolerance for 6 minute walk test    Baseline 3/3: only able to ambulate 3 minutes and 59 seconds prior to rest break 4/12 6 min walk test deferred till next session 4/14: 6 minutes with one rest break 5/24: 10 seconds from full 6 minutes 6/23: unable to perform this session    Time 2    Period Weeks    Status On-going    Target Date 07/17/19      PT SHORT TERM GOAL #3   Title Patient will ambulate 10 meters with least assistive device and prosthesis to improve mobility in home.     Baseline 8/14: not walking outside of // bars yet  8/28: 63 seconds with RW and CGA     Time 2    Period Weeks    Status Achieved    Target Date 09/06/17      PT SHORT TERM GOAL #4   Title Patient will perform STS with single UE support to decrease reliance upon UE's for stability     Baseline patient requires BUE support 11/5: 1 UR support    Time 2    Period Months    Status Achieved    Target Date 11/02/17             PT Long Term Goals - 08/28/19 1330      PT LONG TERM GOAL #1   Title Patient will increase six minute walk test distance to >1000 with LRAD for progression to community ambulator and improve gait ability    Baseline 3/3: 700  ft, terminated early 4/12: deferred due to walking down to PT gym 4/14: 740 ft 6 minutes with one seated rest break 5/24: 800 ft with [facility] 6/23: 600 ft terminated due to back pain; 8/18: 973f in 4'15" (prior)    Time 8    Period Weeks    Status On-going    Target Date 08/28/19      PT LONG TERM GOAL #2   Title Patient will increase FOTO score to equal to or greater  than 57/100 to demonstrate statistically significant improved mobility and quality of life.    Baseline 3/3: 47/100 risk adjusted 43/100 4/12: 41/100 4/28: 50/100 5/24: 46% 6/23: 47%; 8/18:60%    Time 8    Period Weeks    Status Achieved    Target Date 08/28/19      PT LONG TERM GOAL #3   Title Patient will increase Berg Balance score by > 6 points (47/56) to demonstrate decreased fall risk during functional activities.    Baseline 3/3: 41/56 4/12; 49/56    Time 8    Period Weeks    Status Achieved    Target Date 05/08/19      PT LONG TERM GOAL #4   Title Patient will increase BLE gross strength to 4+/5 as to improve functional strength for independent gait, increased standing tolerance and increased ADL ability.    Baseline 3/3: LLE grossly 4-/5 R grossly 3+ hip 4- knee 4/12: LLE grossly 4- to 4/5 with R 4- hip 6/23: did not tolerate resistance due to back pain    Time 8    Period Weeks    Status Partially Met    Target Date 08/28/19      PT LONG TERM GOAL #5   Title Patient will increase dynamic gait index score to >19/24 as to demonstrate reduced fall risk and improved dynamic gait balance for better safety with community/home ambulation.    Baseline 4/28: 14/24 5/24: 17/24 6/24: 20/24    Time 8    Period Weeks    Status Achieved    Target Date 07/03/19      Additional Long Term Goals   Additional Long Term Goals Yes      PT LONG TERM GOAL #6   Title Patient will be mod I for negotiating curb with LRAD while wearing prosthetic for safe community ambulation.    Baseline needs assistance at this time    Time 8     Period Weeks    Status On-going    Target Date 10/23/19                 Plan - 09/11/19 1356    Clinical Impression Statement Patient requires frequent seated rest breaks after stability intervention due to aggravation of back and fatigue. Increased spatial awareness and stability noted with decreased UE support required in standing. Increased ability to ambulate and negotiate turns without use of AD noted this session. The patient would benefit from further skilled PT intervention to maximize QOL, safety, and functional mobility    Personal Factors and Comorbidities Age;Comorbidity 3+;Finances;Fitness;Past/Current Experience;Social Background;Time since onset of injury/illness/exacerbation;Transportation    Comorbidities DM, heart murmur, HLD, HTN, sleep apnea, cellulitis of L foot, hypomagnesemia, unilateral complete BKA R (2019), stage III CKD, PAD, legally blind, hypokalemia, AKI, and sepsis    Examination-Activity Limitations Bathing;Bed Mobility;Caring for [employer];Locomotion Level;Lift;Stand;Toileting;Transfers    Examination-Participation Restrictions Church;Cleaning;Community Activity;Driving;Interpersonal Relationship;Laundry;Shop;Meal Prep;Yard 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 complexity    Rehab Potential Good    Clinical Impairments Affecting Rehab Potential (+) previous independence, motivation to return to walking (-) lives alone, hx of diabetes, limited vision     PT Frequency 2x / week    PT Duration 8 weeks    PT Treatment/Interventions ADLs/Self Care Home Management;Cryotherapy;Electrical Stimulation;Ultrasound;Traction;Moist Heat;DME Instruction;Gait training;Stair training;Functional mobility training;Therapeutic activities;Therapeutic exercise;Patient/family education;Neuromuscular re-education;Balance training;Prosthetic Training;Wheelchair mobility training;Manual techniques;Manual lymph  drainage;Compression bandaging;Taping;Energy conservation;Passive range of motion;Aquatic Therapy;Vestibular    PT Next Visit Plan walk on beach  PT Home Exercise Plan see [id number] on medbridge, spc ambulation    Consulted and Agree with Plan of Care Patient           Patient will benefit from skilled therapeutic intervention in order to improve the following deficits and impairments:  Abnormal gait, Decreased activity tolerance, Decreased balance, Decreased knowledge of precautions, Decreased endurance, Decreased knowledge of use of DME, Decreased mobility, Decreased range of motion, Difficulty walking, Decreased strength, Increased edema, Impaired flexibility, Impaired perceived functional ability, Prosthetic Dependency, Postural dysfunction, Improper body mechanics, Pain, Decreased skin integrity, Impaired vision/preception, Impaired sensation  Visit Diagnosis: Muscle weakness (generalized)  Unsteadiness on feet  Other abnormalities of gait and mobility  Unilateral complete BKA, right, sequela ([place])     Problem List Patient Active Problem List   Diagnosis Date Noted  . Elevated lactic acid level   . Diabetic ketoacidosis without coma associated with type 2 diabetes mellitus ([place])   . Cellulitis of left foot 12/17/2018  . Pain due to onychomycosis of toenail of right foot 07/05/2018  . Abnormality of gait 10/12/2017  . Type 2 diabetes mellitus with hyperglycemia, with long-term current use of insulin ([place])   . Flatulence   . Hypomagnesemia   . Unilateral complete BKA, right, sequela ([place])   . Benign essential HTN   . Hypoalbuminemia due to protein-calorie malnutrition ([place])   . S/P below knee amputation, right ([place]) 04/12/2017  . Acute blood loss anemia   . Other encephalopathy 04/08/2017  . Encephalopathy 04/08/2017  . Altered mental status 04/08/2017  . Labile blood pressure   . Labile blood glucose   . Drug induced constipation   . Stage 3 chronic kidney disease    . Bacteremia   . S/P unilateral BKA (below knee amputation), right ([place]) 04/04/2017  . PAD (peripheral artery disease) ([place])   . Type 2 diabetes mellitus with right diabetic foot ulcer ([place])   . Post-operative pain   . Legally blind   . Upper GI bleed   . Streptococcal bacteremia 04/01/2017  . Hypokalemia 03/30/2017  . Uncontrolled type 2 diabetes mellitus with hyperglycemia, with long-term current use of insulin ([place]) 03/30/2017  . Type 2 diabetes mellitus with peripheral neuropathy ([place]) 03/30/2017  . AKI (acute kidney injury) ([place]) 03/30/2017  . CKD stage 3 due to type 2 diabetes mellitus ([place]) 03/30/2017  . Sepsis ([place]) 03/30/2017  . Heart murmur 11/22/2016  . Hyperlipidemia associated with type 2 diabetes mellitus ([place]) 11/22/2016  . Obstructive sleep apnea syndrome 11/22/2016  . Essential hypertension 12/17/2012  Janna Arch, PT, DPT   09/11/2019, 2:25 PM  [place] MAIN [facility] SERVICES [address] [place], [place], [address] Phone: [phone]   Fax:  [phone]  Name: TOWNSEND CUDWORTH MRN: [id number] Date of Birth: Sep 24, 1955

## 2019-09-18 ENCOUNTER — Ambulatory Visit: Payer: Medicare Other

## 2019-09-18 ENCOUNTER — Other Ambulatory Visit: Payer: Self-pay

## 2019-09-18 DIAGNOSIS — R2681 Unsteadiness on feet: Secondary | ICD-10-CM

## 2019-09-18 DIAGNOSIS — M6281 Muscle weakness (generalized): Secondary | ICD-10-CM

## 2019-09-18 DIAGNOSIS — R2689 Other abnormalities of gait and mobility: Secondary | ICD-10-CM

## 2019-09-18 DIAGNOSIS — S88111S Complete traumatic amputation at level between knee and ankle, right lower leg, sequela: Secondary | ICD-10-CM

## 2019-09-18 NOTE — Therapy (Signed)
[place] MAIN [facility] SERVICES [address] [place], [place], [address] Phone: [phone]   Fax:  [phone]  Physical Therapy Treatment  Patient Details  Name: Trevor [name] MRN: [id number] Date of Birth: 1955-09-17 Referring Provider (PT): Jamse Arn, DR   Encounter Date: 09/18/2019   PT End of Session - 09/18/19 1424    Visit Number 43    Number of Visits 58    Date for PT Re-Evaluation 10/23/19    Authorization Type 3/10 PN 09/02/19    Authorization Time Period 08/28/19-10/23/19    PT Start Time 1300    PT Stop Time 1344    PT Time Calculation (min) 44 min    Equipment Utilized During Treatment Gait belt;Other (comment)    Activity Tolerance Patient limited by fatigue;Patient tolerated treatment well    Behavior During Therapy [facility] for tasks assessed/performed           Past Medical History:  Diagnosis Date   COVID-29 Dec 2018   Diabetes mellitus without complication ([place])    Heart murmur    Hyperlipidemia    Hypertension    Sleep apnea     Past Surgical History:  Procedure Laterality Date   AMPUTATION Right 03/31/2017   Procedure: RIGHT FOOT 1ST AND 2ND RAY AMPUTATION;  Surgeon: Newt Minion, MD;  Location: [place];  Service: Orthopedics;  Laterality: Right;   AMPUTATION Right 04/01/2017   Procedure: AMPUTATION BELOW KNEE;  Surgeon: Newt Minion, MD;  Location: [place];  Service: Orthopedics;  Laterality: Right;   COLONOSCOPY WITH PROPOFOL N/A 10/02/2017   Procedure: COLONOSCOPY WITH PROPOFOL;  Surgeon: [name], MD;  Location: [facility] ENDOSCOPY;  Service: Gastroenterology;  Laterality: N/A;   CORNEAL TRANSPLANT      There were no vitals filed for this visit.   Subjective Assessment - 09/18/19 1420    Subjective Patient reports his back was hurting yesterday, is feeling better today. No falls or LOB since last session.    Pertinent History Patient returning to PT after multiple hospitalizations for L  foot and COVID-19. Received home health therapy till 02/14/19  . Patient last seen 12/13/2018 in this outpatient clinic. PMH includes DM, heart murmur, HLD, HTN, sleep apnea, cellulitis of L foot, hypomagnesemia, unilateral complete BKA R (2019), stage III CKD, PAD, legally blind, hypokalemia, AKI, and sepsis. Uses wheelchair primarily at home rather than walking with prosthesis.    Limitations Standing;Walking;House hold activities;Other (comment);Lifting    How long can you sit comfortably? hours /na/    How long can you stand comfortably? 15 minutes with UE support    How long can you walk comfortably? walked in the house    Patient Stated Goals walk better.    Currently in Pain? No/denies            BP at start of session 163/85         Standing: close CGA with focus on stability     ambulate >600 ft with no rest breaks without SPC close CGA, cues for gait mechanics, arm swing, weight shift and posture. Additional cues for negotiation of turns, people, and obstacles.        by support bar; CGA with cues for safety and sequencing.  -Step over and back orange hurdle 10x each LE;single to no UE support; very challenging with no UE support  -cone taps with SUE support/finger tip support 10x each LE (3 cone sequence); very challenging for prosthetic  limb in regards to force and position in space -UE support modified  lunge BUE support with support surface 10x each LE  cone lateral taps 8x each side, BUE support     Seated:  -swiss ball silver forward, diagonal L, diagonal R roll for low back pain reduction x 8 each direction  -march with upright posture 15x each LE   -hamstring stretch 2x 30 seconds each LE -Half moon kick 10x each LE   Examination of plantar surface of foot: pictures taken for documentation.     Pt educated throughout session about proper posture and technique with exercises. Improved exercise technique, movement at target joints, use of target muscles after min  to mod verbal, visual, tactile cues.                     PT Education - 09/18/19 1420    Education Details exercise techinque, body mechanics, weight acceptance onto prosthetic limb.    Person(s) Educated Patient    Methods Explanation;Demonstration;Tactile cues;Verbal cues    Comprehension Verbalized understanding;Returned demonstration;Verbal cues required;Tactile cues required            PT Short Term Goals - 08/28/19 1326      PT SHORT TERM GOAL #2   Title Patient will ambulate for 6 minutes without seated rest break to increase tolerance for 6 minute walk test    Baseline 3/3: only able to ambulate 3 minutes and 59 seconds prior to rest break 4/12 6 min walk test deferred till next session 4/14: 6 minutes with one rest break 5/24: 10 seconds from full 6 minutes 6/23: unable to perform this session    Time 2    Period Weeks    Status On-going    Target Date 07/17/19      PT SHORT TERM GOAL #3   Title Patient will ambulate 10 meters with least assistive device and prosthesis to improve mobility in home.     Baseline 8/14: not walking outside of // bars yet  8/28: 63 seconds with RW and CGA     Time 2    Period Weeks    Status Achieved    Target Date 09/06/17      PT SHORT TERM GOAL #4   Title Patient will perform STS with single UE support to decrease reliance upon UE's for stability     Baseline patient requires BUE support 11/5: 1 UR support    Time 2    Period Months    Status Achieved    Target Date 11/02/17             PT Long Term Goals - 08/28/19 1330      PT LONG TERM GOAL #1   Title Patient will increase six minute walk test distance to >1000 with LRAD for progression to community ambulator and improve gait ability    Baseline 3/3: 700 ft, terminated early 4/12: deferred due to walking down to PT gym 4/14: 740 ft 6 minutes with one seated rest break 5/24: 800 ft with [facility] 6/23: 600 ft terminated due to back pain; 8/18: 95f in 4'15" (prior)     Time 8    Period Weeks    Status On-going    Target Date 08/28/19      PT LONG TERM GOAL #2   Title Patient will increase FOTO score to equal to or greater than 57/100 to demonstrate statistically significant improved mobility and quality of life.    Baseline 3/3:  47/100 risk adjusted 43/100 4/12: 41/100 4/28: 50/100 5/24: 46% 6/23: 47%; 8/18:60%    Time 8    Period Weeks    Status Achieved    Target Date 08/28/19      PT LONG TERM GOAL #3   Title Patient will increase Berg Balance score by > 6 points (47/56) to demonstrate decreased fall risk during functional activities.    Baseline 3/3: 41/56 4/12; 49/56    Time 8    Period Weeks    Status Achieved    Target Date 05/08/19      PT LONG TERM GOAL #4   Title Patient will increase BLE gross strength to 4+/5 as to improve functional strength for independent gait, increased standing tolerance and increased ADL ability.    Baseline 3/3: LLE grossly 4-/5 R grossly 3+ hip 4- knee 4/12: LLE grossly 4- to 4/5 with R 4- hip 6/23: did not tolerate resistance due to back pain    Time 8    Period Weeks    Status Partially Met    Target Date 08/28/19      PT LONG TERM GOAL #5   Title Patient will increase dynamic gait index score to >19/24 as to demonstrate reduced fall risk and improved dynamic gait balance for better safety with community/home ambulation.    Baseline 4/28: 14/24 5/24: 17/24 6/24: 20/24    Time 8    Period Weeks    Status Achieved    Target Date 07/03/19      Additional Long Term Goals   Additional Long Term Goals Yes      PT LONG TERM GOAL #6   Title Patient will be mod I for negotiating curb with LRAD while wearing prosthetic for safe community ambulation.    Baseline needs assistance at this time    Time 8    Period Weeks    Status On-going    Target Date 10/23/19                 Plan - 09/18/19 1433    Clinical Impression Statement Patient progressing with weight acceptance onto prosthetic limb.  Patient is challenged with prolonged standing due to back pain requiring frequent rest breaks for back pain reduction. Increased spatial awareness and stability noted with decreased UE support required in standing. Increased ability to ambulate and negotiate turns without use of AD noted this session. The patient would benefit from further skilled PT intervention to maximize QOL, safety, and functional mobility    Personal Factors and Comorbidities Age;Comorbidity 3+;Finances;Fitness;Past/Current Experience;Social Background;Time since onset of injury/illness/exacerbation;Transportation    Comorbidities DM, heart murmur, HLD, HTN, sleep apnea, cellulitis of L foot, hypomagnesemia, unilateral complete BKA R (2019), stage III CKD, PAD, legally blind, hypokalemia, AKI, and sepsis    Examination-Activity Limitations Bathing;Bed Mobility;Caring for [employer];Locomotion Level;Lift;Stand;Toileting;Transfers    Examination-Participation Restrictions Church;Cleaning;Community Activity;Driving;Interpersonal Relationship;Laundry;Shop;Meal Prep;Yard 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 complexity    Rehab Potential Good    Clinical Impairments Affecting Rehab Potential (+) previous independence, motivation to return to walking (-) lives alone, hx of diabetes, limited vision     PT Frequency 2x / week    PT Duration 8 weeks    PT Treatment/Interventions ADLs/Self Care Home Management;Cryotherapy;Electrical Stimulation;Ultrasound;Traction;Moist Heat;DME Instruction;Gait training;Stair training;Functional mobility training;Therapeutic activities;Therapeutic exercise;Patient/family education;Neuromuscular re-education;Balance training;Prosthetic Training;Wheelchair mobility training;Manual techniques;Manual lymph drainage;Compression bandaging;Taping;Energy conservation;Passive range of motion;Aquatic Therapy;Vestibular    PT Next Visit Plan walk on  beach    PT Home Exercise Plan  see [id number] on medbridge, spc ambulation    Consulted and Agree with Plan of Care Patient           Patient will benefit from skilled therapeutic intervention in order to improve the following deficits and impairments:  Abnormal gait, Decreased activity tolerance, Decreased balance, Decreased knowledge of precautions, Decreased endurance, Decreased knowledge of use of DME, Decreased mobility, Decreased range of motion, Difficulty walking, Decreased strength, Increased edema, Impaired flexibility, Impaired perceived functional ability, Prosthetic Dependency, Postural dysfunction, Improper body mechanics, Pain, Decreased skin integrity, Impaired vision/preception, Impaired sensation  Visit Diagnosis: Muscle weakness (generalized)  Unsteadiness on feet  Other abnormalities of gait and mobility  Unilateral complete BKA, right, sequela (HCC)     Problem List Patient Active Problem List   Diagnosis Date Noted   Elevated lactic acid level    Diabetic ketoacidosis without coma associated with type 2 diabetes mellitus ([place])    Cellulitis of left foot 12/17/2018   Pain due to onychomycosis of toenail of right foot 07/05/2018   Abnormality of gait 10/12/2017   Type 2 diabetes mellitus with hyperglycemia, with long-term current use of insulin (HCC)    Flatulence    Hypomagnesemia    Unilateral complete BKA, right, sequela (HCC)    Benign essential HTN    Hypoalbuminemia due to protein-calorie malnutrition ([place])    S/P below knee amputation, right ([place]) 04/12/2017   Acute blood loss anemia    Other encephalopathy 04/08/2017   Encephalopathy 04/08/2017   Altered mental status 04/08/2017   Labile blood pressure    Labile blood glucose    Drug induced constipation    Stage 3 chronic kidney disease    Bacteremia    S/P unilateral BKA (below knee amputation), right ([place]) 04/04/2017   PAD (peripheral artery disease) ([place])    Type 2  diabetes mellitus with right diabetic foot ulcer ([place])    Post-operative pain    Legally blind    Upper GI bleed    Streptococcal bacteremia 04/01/2017   Hypokalemia 03/30/2017   Uncontrolled type 2 diabetes mellitus with hyperglycemia, with long-term current use of insulin ([place]) 03/30/2017   Type 2 diabetes mellitus with peripheral neuropathy ([place]) 03/30/2017   AKI (acute kidney injury) ([place]) 03/30/2017   CKD stage 3 due to type 2 diabetes mellitus ([place]) 03/30/2017   Sepsis ([place]) 03/30/2017   Heart murmur 11/22/2016   Hyperlipidemia associated with type 2 diabetes mellitus ([place]) 11/22/2016   Obstructive sleep apnea syndrome 11/22/2016   Essential hypertension 12/17/2012   Janna Arch, PT, DPT   09/18/2019, 2:36 PM  [place] MAIN [facility] SERVICES [address] [place], [place], [address] Phone: [phone]   Fax:  [phone]  Name: JRAKE RODRIQUEZ MRN: [id number] Date of Birth: 11/04/1955

## 2019-09-23 ENCOUNTER — Ambulatory Visit: Payer: Medicare Other

## 2019-09-24 ENCOUNTER — Ambulatory Visit (INDEPENDENT_AMBULATORY_CARE_PROVIDER_SITE_OTHER): Payer: Medicare Other | Admitting: Podiatry

## 2019-09-24 ENCOUNTER — Other Ambulatory Visit: Payer: Self-pay

## 2019-09-24 ENCOUNTER — Encounter: Payer: Self-pay | Admitting: Podiatry

## 2019-09-24 DIAGNOSIS — B351 Tinea unguium: Secondary | ICD-10-CM

## 2019-09-24 DIAGNOSIS — M79675 Pain in left toe(s): Secondary | ICD-10-CM | POA: Diagnosis not present

## 2019-09-24 DIAGNOSIS — E1142 Type 2 diabetes mellitus with diabetic polyneuropathy: Secondary | ICD-10-CM

## 2019-09-25 ENCOUNTER — Ambulatory Visit: Payer: Medicare Other

## 2019-09-25 ENCOUNTER — Encounter: Payer: Self-pay | Admitting: Podiatry

## 2019-09-25 DIAGNOSIS — S88111S Complete traumatic amputation at level between knee and ankle, right lower leg, sequela: Secondary | ICD-10-CM

## 2019-09-25 DIAGNOSIS — M6281 Muscle weakness (generalized): Secondary | ICD-10-CM | POA: Diagnosis not present

## 2019-09-25 DIAGNOSIS — R2689 Other abnormalities of gait and mobility: Secondary | ICD-10-CM

## 2019-09-25 DIAGNOSIS — R2681 Unsteadiness on feet: Secondary | ICD-10-CM

## 2019-09-25 NOTE — Progress Notes (Signed)
  Subjective:  Patient ID: Trevor [name], male    DOB: [birth year],  MRN: [id number]  Chief Complaint  Patient presents with  . Nail Problem    nail trim [facility]   64 y.o. male returns for the above complaint.  Patient presents with thickened elongated mycotic toenails of the left lower extremity x5.  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  Patient states that they have been painful when ambulating.  He is not able to debride down himself.  He is a type II diabetic with last A1c of 9.6.  Objective:  There were no vitals filed for this visit. Podiatric Exam: Vascular: dorsalis pedis and posterior tibial pulses are palpable left capillary return is immediate. Temperature gradient is WNL. Skin turgor WNL  Sensorium: Normal Semmes Weinstein monofilament test. Normal tactile sensation left Nail Exam: Pt has thick disfigured discolored nails with subungual debris noted bilateral entire nail hallux through fifth toenails Ulcer Exam: There is no evidence of ulcer or pre-ulcerative changes or infection. Orthopedic Exam: Muscle tone and strength are WNL. No limitations in general ROM. No crepitus or effusions noted. HAV   left.  Hammer toes 2-5   left.  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 No infection or ulcers.  Left lower extremity swelling 1+ edema.  Unna boot therapy.  Assessment & Plan:  Patient was evaluated and treated and all questions answered.  Swelling to the left lower extremity -Improving.  Onychomycosis with pain x5 -Nails palliatively debrided as below. -Educated on self-care  Procedure: Nail Debridement Rationale: pain  Type of Debridement: manual, sharp debridement. Instrumentation: Nail nipper, rotary burr. Number of Nails: 5  Procedures and Treatment: Consent by patient was obtained for treatment procedures. The patient understood the discussion of treatment and procedures well. All questions  were answered thoroughly reviewed. Debridement of mycotic and hypertrophic toenails, 1 through 5 bilateral and clearing of subungual debris. No ulceration, no infection noted.  Return Visit-Office Procedure: Patient instructed to return to the office for a follow up visit 3 months for continued evaluation and treatment.  Boneta Lucks, DPM    No follow-ups on file.

## 2019-09-25 NOTE — Therapy (Signed)
[place] MAIN [facility] SERVICES [address] [place], [place], [address] Phone: [phone]   Fax:  [phone]  Physical Therapy Treatment  Patient Details  Name: Trevor [name] MRN: [id number] Date of Birth: Jan 30, 1955 Referring Provider (PT): Jamse Arn, DR   Encounter Date: 09/25/2019   PT End of Session - 09/25/19 1324    Visit Number 44    Number of Visits 35    Date for PT Re-Evaluation 10/23/19    Authorization Type 4/10 PN 09/02/19    Authorization Time Period 08/28/19-10/23/19    PT Start Time 1305    PT Stop Time 1345    PT Time Calculation (min) 40 min    Equipment Utilized During Treatment Gait belt;Other (comment)    Activity Tolerance Patient limited by fatigue;Patient tolerated treatment well    Behavior During Therapy [facility] for tasks assessed/performed           Past Medical History:  Diagnosis Date  . COVID-29 Dec 2018  . Diabetes mellitus without complication ([place])   . Heart murmur   . Hyperlipidemia   . Hypertension   . Sleep apnea     Past Surgical History:  Procedure Laterality Date  . AMPUTATION Right 03/31/2017   Procedure: RIGHT FOOT 1ST AND 2ND RAY AMPUTATION;  Surgeon: Newt Minion, MD;  Location: [place];  Service: Orthopedics;  Laterality: Right;  . AMPUTATION Right 04/01/2017   Procedure: AMPUTATION BELOW KNEE;  Surgeon: Newt Minion, MD;  Location: [place];  Service: Orthopedics;  Laterality: Right;  . COLONOSCOPY WITH PROPOFOL N/A 10/02/2017   Procedure: COLONOSCOPY WITH PROPOFOL;  Surgeon: [name], MD;  Location: [facility] ENDOSCOPY;  Service: Gastroenterology;  Laterality: N/A;  . CORNEAL TRANSPLANT      There were no vitals filed for this visit.   Subjective Assessment - 09/25/19 1318    Subjective Patient went to [place] over the weekend, thats why he missed monday. Was walking in [place].    Pertinent History Patient returning to PT after multiple hospitalizations for L foot and  COVID-19. Received home health therapy till 02/14/19  . Patient last seen 12/13/2018 in this outpatient clinic. PMH includes DM, heart murmur, HLD, HTN, sleep apnea, cellulitis of L foot, hypomagnesemia, unilateral complete BKA R (2019), stage III CKD, PAD, legally blind, hypokalemia, AKI, and sepsis. Uses wheelchair primarily at home rather than walking with prosthesis.    Limitations Standing;Walking;House hold activities;Other (comment);Lifting    How long can you sit comfortably? hours /na/    How long can you stand comfortably? 15 minutes with UE support    How long can you walk comfortably? walked in the house    Patient Stated Goals walk better.    Currently in Pain? No/denies                  BP at start of session  142/72     Standing: close CGA with focus on stability     ambulate >600 ft with no rest breaks without SPC close CGA, cues for gait mechanics, arm swing, weight shift and posture. Additional cues for negotiation of turns, people, and obstacles.        by support bar; CGA with cues for safety and sequencing.   5lb ankle weight -high knee marching BUE support 15x each LE -hip abduction 15x each LE, cues for upright posture,  -hip extension with focus on gluteal squeeze, 15x each LE, back pain  reported by end of set. Prosthetic limb pain additionally reported.     Seated:  -seated rainbow ball volleyball passes for core activation and pertubation x 3 minutes  -bolster kicks: bilateral LE catch then push with bilateral feet. Challenging for coordination/spatial awareness x 15   -hamstring stretch 2x 30 seconds each LE -Half moon kick 10x each LE -rainbow ball adduction 15x 3 second holds        Pt educated throughout session about proper posture and technique with exercises. Improved exercise technique, movement at target joints, use of target muscles after min to mod verbal, visual, tactile cues.                       PT Education - 09/25/19  1324    Education Details exercise technique, body mechanics    Person(s) Educated Patient    Methods Explanation;Demonstration;Tactile cues;Verbal cues    Comprehension Verbalized understanding;Returned demonstration;Verbal cues required;Tactile cues required            PT Short Term Goals - 08/28/19 1326      PT SHORT TERM GOAL #2   Title Patient will ambulate for 6 minutes without seated rest break to increase tolerance for 6 minute walk test    Baseline 3/3: only able to ambulate 3 minutes and 59 seconds prior to rest break 4/12 6 min walk test deferred till next session 4/14: 6 minutes with one rest break 5/24: 10 seconds from full 6 minutes 6/23: unable to perform this session    Time 2    Period Weeks    Status On-going    Target Date 07/17/19      PT SHORT TERM GOAL #3   Title Patient will ambulate 10 meters with least assistive device and prosthesis to improve mobility in home.     Baseline 8/14: not walking outside of // bars yet  8/28: 63 seconds with RW and CGA     Time 2    Period Weeks    Status Achieved    Target Date 09/06/17      PT SHORT TERM GOAL #4   Title Patient will perform STS with single UE support to decrease reliance upon UE's for stability     Baseline patient requires BUE support 11/5: 1 UR support    Time 2    Period Months    Status Achieved    Target Date 11/02/17             PT Long Term Goals - 08/28/19 1330      PT LONG TERM GOAL #1   Title Patient will increase six minute walk test distance to >1000 with LRAD for progression to community ambulator and improve gait ability    Baseline 3/3: 700 ft, terminated early 4/12: deferred due to walking down to PT gym 4/14: 740 ft 6 minutes with one seated rest break 5/24: 800 ft with [facility] 6/23: 600 ft terminated due to back pain; 8/18: 958f in 4'15" (prior)    Time 8    Period Weeks    Status On-going    Target Date 08/28/19      PT LONG TERM GOAL #2   Title Patient will increase FOTO  score to equal to or greater than 57/100 to demonstrate statistically significant improved mobility and quality of life.    Baseline 3/3: 47/100 risk adjusted 43/100 4/12: 41/100 4/28: 50/100 5/24: 46% 6/23: 47%; 8/18:60%    Time 8    Period  Weeks    Status Achieved    Target Date 08/28/19      PT LONG TERM GOAL #3   Title Patient will increase Berg Balance score by > 6 points (47/56) to demonstrate decreased fall risk during functional activities.    Baseline 3/3: 41/56 4/12; 49/56    Time 8    Period Weeks    Status Achieved    Target Date 05/08/19      PT LONG TERM GOAL #4   Title Patient will increase BLE gross strength to 4+/5 as to improve functional strength for independent gait, increased standing tolerance and increased ADL ability.    Baseline 3/3: LLE grossly 4-/5 R grossly 3+ hip 4- knee 4/12: LLE grossly 4- to 4/5 with R 4- hip 6/23: did not tolerate resistance due to back pain    Time 8    Period Weeks    Status Partially Met    Target Date 08/28/19      PT LONG TERM GOAL #5   Title Patient will increase dynamic gait index score to >19/24 as to demonstrate reduced fall risk and improved dynamic gait balance for better safety with community/home ambulation.    Baseline 4/28: 14/24 5/24: 17/24 6/24: 20/24    Time 8    Period Weeks    Status Achieved    Target Date 07/03/19      Additional Long Term Goals   Additional Long Term Goals Yes      PT LONG TERM GOAL #6   Title Patient will be mod I for negotiating curb with LRAD while wearing prosthetic for safe community ambulation.    Baseline needs assistance at this time    Time 8    Period Weeks    Status On-going    Target Date 10/23/19                 Plan - 09/25/19 1337    Clinical Impression Statement Patient continues to progress with functional mobility and stability. He missed last session due to being out of town and will miss the following week due to going to the beach. Standing tolerance  continues to be limited by back pain rather than fatigue of prosthetic leg. The patient would benefit from further skilled PT intervention to maximize QOL, safety, and functional mobility    Personal Factors and Comorbidities Age;Comorbidity 3+;Finances;Fitness;Past/Current Experience;Social Background;Time since onset of injury/illness/exacerbation;Transportation    Comorbidities DM, heart murmur, HLD, HTN, sleep apnea, cellulitis of L foot, hypomagnesemia, unilateral complete BKA R (2019), stage III CKD, PAD, legally blind, hypokalemia, AKI, and sepsis    Examination-Activity Limitations Bathing;Bed Mobility;Caring for [employer];Locomotion Level;Lift;Stand;Toileting;Transfers    Examination-Participation Restrictions Church;Cleaning;Community Activity;Driving;Interpersonal Relationship;Laundry;Shop;Meal Prep;Yard 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 complexity    Rehab Potential Good    Clinical Impairments Affecting Rehab Potential (+) previous independence, motivation to return to walking (-) lives alone, hx of diabetes, limited vision     PT Frequency 2x / week    PT Duration 8 weeks    PT Treatment/Interventions ADLs/Self Care Home Management;Cryotherapy;Electrical Stimulation;Ultrasound;Traction;Moist Heat;DME Instruction;Gait training;Stair training;Functional mobility training;Therapeutic activities;Therapeutic exercise;Patient/family education;Neuromuscular re-education;Balance training;Prosthetic Training;Wheelchair mobility training;Manual techniques;Manual lymph drainage;Compression bandaging;Taping;Energy conservation;Passive range of motion;Aquatic Therapy;Vestibular    PT Next Visit Plan walk on beach    PT Home Exercise Plan see [id number] on medbridge, spc ambulation    Consulted and Agree with Plan of Care Patient           Patient will benefit  from skilled therapeutic intervention in order to improve the  following deficits and impairments:  Abnormal gait, Decreased activity tolerance, Decreased balance, Decreased knowledge of precautions, Decreased endurance, Decreased knowledge of use of DME, Decreased mobility, Decreased range of motion, Difficulty walking, Decreased strength, Increased edema, Impaired flexibility, Impaired perceived functional ability, Prosthetic Dependency, Postural dysfunction, Improper body mechanics, Pain, Decreased skin integrity, Impaired vision/preception, Impaired sensation  Visit Diagnosis: Muscle weakness (generalized)  Unsteadiness on feet  Other abnormalities of gait and mobility  Unilateral complete BKA, right, sequela ([place])     Problem List Patient Active Problem List   Diagnosis Date Noted  . Elevated lactic acid level   . Diabetic ketoacidosis without coma associated with type 2 diabetes mellitus ([place])   . Cellulitis of left foot 12/17/2018  . Pain due to onychomycosis of toenail of right foot 07/05/2018  . Abnormality of gait 10/12/2017  . Type 2 diabetes mellitus with hyperglycemia, with long-term current use of insulin ([place])   . Flatulence   . Hypomagnesemia   . Unilateral complete BKA, right, sequela ([place])   . Benign essential HTN   . Hypoalbuminemia due to protein-calorie malnutrition ([place])   . S/P below knee amputation, right ([place]) 04/12/2017  . Acute blood loss anemia   . Other encephalopathy 04/08/2017  . Encephalopathy 04/08/2017  . Altered mental status 04/08/2017  . Labile blood pressure   . Labile blood glucose   . Drug induced constipation   . Stage 3 chronic kidney disease   . Bacteremia   . S/P unilateral BKA (below knee amputation), right ([place]) 04/04/2017  . PAD (peripheral artery disease) ([place])   . Type 2 diabetes mellitus with right diabetic foot ulcer ([place])   . Post-operative pain   . Legally blind   . Upper GI bleed   . Streptococcal bacteremia 04/01/2017  . Hypokalemia 03/30/2017  . Uncontrolled type 2 diabetes  mellitus with hyperglycemia, with long-term current use of insulin ([place]) 03/30/2017  . Type 2 diabetes mellitus with peripheral neuropathy ([place]) 03/30/2017  . AKI (acute kidney injury) ([place]) 03/30/2017  . CKD stage 3 due to type 2 diabetes mellitus ([place]) 03/30/2017  . Sepsis ([place]) 03/30/2017  . Heart murmur 11/22/2016  . Hyperlipidemia associated with type 2 diabetes mellitus ([place]) 11/22/2016  . Obstructive sleep apnea syndrome 11/22/2016  . Essential hypertension 12/17/2012   Janna Arch, PT, DPT   09/25/2019, 1:46 PM  [place] MAIN [facility] SERVICES [address] [place], [place], [address] Phone: [phone]   Fax:  [phone]  Name: GEORGIOS KINA MRN: [id number] Date of Birth: 1955-03-15

## 2019-09-30 ENCOUNTER — Ambulatory Visit: Payer: Medicare Other

## 2019-10-02 ENCOUNTER — Ambulatory Visit: Payer: Medicare Other

## 2019-10-07 ENCOUNTER — Ambulatory Visit: Payer: Medicare Other

## 2019-10-09 ENCOUNTER — Ambulatory Visit: Payer: Medicare Other

## 2019-10-14 ENCOUNTER — Ambulatory Visit: Payer: Medicare Other

## 2019-10-16 ENCOUNTER — Ambulatory Visit: Payer: Medicare Other

## 2019-10-21 ENCOUNTER — Ambulatory Visit: Payer: Medicare Other

## 2019-10-23 ENCOUNTER — Ambulatory Visit: Payer: Medicare Other

## 2019-10-28 ENCOUNTER — Ambulatory Visit: Payer: Medicare Other

## 2019-10-30 ENCOUNTER — Ambulatory Visit: Payer: Medicare Other

## 2019-11-04 ENCOUNTER — Ambulatory Visit: Payer: Medicare Other | Attending: Physical Medicine & Rehabilitation

## 2019-11-04 ENCOUNTER — Other Ambulatory Visit: Payer: Self-pay

## 2019-11-04 DIAGNOSIS — M6281 Muscle weakness (generalized): Secondary | ICD-10-CM

## 2019-11-04 DIAGNOSIS — R2689 Other abnormalities of gait and mobility: Secondary | ICD-10-CM | POA: Diagnosis present

## 2019-11-04 DIAGNOSIS — R2681 Unsteadiness on feet: Secondary | ICD-10-CM

## 2019-11-04 DIAGNOSIS — S88111S Complete traumatic amputation at level between knee and ankle, right lower leg, sequela: Secondary | ICD-10-CM | POA: Diagnosis present

## 2019-11-04 NOTE — Therapy (Signed)
[place] MAIN [facility] SERVICES [address] [place], [place], [address] Phone: [phone]   Fax:  [phone]  Physical Therapy Treatment/ RECERT 1x/DISCHARGE  Patient Details  Name: Trevor [name] MRN: [id number] Date of Birth: [birth year] Referring Provider (PT): Jamse Arn, DR   Encounter Date: 11/04/2019   PT End of Session - 11/04/19 1349    Visit Number 45    Number of Visits 45    Date for PT Re-Evaluation 11/04/19    Authorization Type 5/10 PN 09/02/19    Authorization Time Period 08/28/19-10/23/19    PT Start Time 1302    PT Stop Time 1341    PT Time Calculation (min) 39 min    Equipment Utilized During Treatment Gait belt;Other (comment)    Activity Tolerance Patient limited by fatigue;Patient tolerated treatment well    Behavior During Therapy [facility] for tasks assessed/performed           Past Medical History:  Diagnosis Date  . COVID-29 Dec 2018  . Diabetes mellitus without complication ([place])   . Heart murmur   . Hyperlipidemia   . Hypertension   . Sleep apnea     Past Surgical History:  Procedure Laterality Date  . AMPUTATION Right 03/31/2017   Procedure: RIGHT FOOT 1ST AND 2ND RAY AMPUTATION;  Surgeon: Newt Minion, MD;  Location: [place];  Service: Orthopedics;  Laterality: Right;  . AMPUTATION Right 04/01/2017   Procedure: AMPUTATION BELOW KNEE;  Surgeon: Newt Minion, MD;  Location: [place];  Service: Orthopedics;  Laterality: Right;  . COLONOSCOPY WITH PROPOFOL N/A 10/02/2017   Procedure: COLONOSCOPY WITH PROPOFOL;  Surgeon: [name], MD;  Location: [facility];  Service: Gastroenterology;  Laterality: N/A;  . CORNEAL TRANSPLANT      There were no vitals filed for this visit.   Subjective Assessment - 11/04/19 1350    Subjective Patient was last seen 9/15, after he returned from his trip was exposed to [place] and self quarantined himself.  Reports on his vacation his back was limiting him from  walking. Has been compliant with HEP    Pertinent History Patient returning to PT after multiple hospitalizations for L foot and COVID-19. Received home health therapy till 02/14/19  . Patient last seen 12/13/2018 in this outpatient clinic. PMH includes DM, heart murmur, HLD, HTN, sleep apnea, cellulitis of L foot, hypomagnesemia, unilateral complete BKA R (2019), stage III CKD, PAD, legally blind, hypokalemia, AKI, and sepsis. Uses wheelchair primarily at home rather than walking with prosthesis.    Limitations Standing;Walking;House hold activities;Other (comment);Lifting    How long can you sit comfortably? hours /na/    How long can you stand comfortably? 15 minutes with UE support    How long can you walk comfortably? walked in the house    Patient Stated Goals walk better.    Currently in Pain? No/denies              [facility] PT Assessment - 11/04/19 0001      Dynamic Gait Index   Level Surface Normal    Change in Gait Speed Normal    Gait with Horizontal Head Turns Mild Impairment    Gait with Vertical Head Turns Mild Impairment    Gait and Pivot Turn Mild Impairment    Step Over Obstacle Normal    Step Around Obstacles Normal    Steps Mild Impairment    Total Score 20  Patient was last seen 9/15, after he returned from his trip was exposed to [place] and self quarantined himself.  Reports on his vacation his back was limiting him from walking. Has been compliant with HEP   BP at start of session: 125/70    Goals: Ambulate 6 minutes: 4 mins 50 seconds 6 MWT: stopped at 1000 ft FOTO: 55% Strength: grossly 4+/5 bilaterally  DGI: 20/24 Curb: able to perform with [facility]     Patient discharged due to functional status being able to be maintained independently at home as seen in ~ 6 week lapse from therapy with little to no changes in scores and an increase in duration of ambulation tolerated. Patient encouraged to follow up with physician about back pain. I will be happy to  see this patient again in the future as needed.                    PT Education - 11/04/19 1351    Education Details discharge, goals, POC    Person(s) Educated Patient    Methods Explanation;Demonstration;Tactile cues;Verbal cues    Comprehension Verbalized understanding;Returned demonstration;Verbal cues required;Tactile cues required            PT Short Term Goals - 11/04/19 1508      PT SHORT TERM GOAL #2   Title Patient will ambulate for 6 minutes without seated rest break to increase tolerance for 6 minute walk test    Baseline 3/3: only able to ambulate 3 minutes and 59 seconds prior to rest break 4/12 6 min walk test deferred till next session 4/14: 6 minutes with one rest break 5/24: 10 seconds from full 6 minutes 6/23: unable to perform this session 10/25: 4 mins 50 seconds    Time 2    Period Weeks    Status Partially Met    Target Date 07/17/19      PT SHORT TERM GOAL #3   Title Patient will ambulate 10 meters with least assistive device and prosthesis to improve mobility in home.     Baseline 8/14: not walking outside of // bars yet  8/28: 63 seconds with RW and CGA     Time 2    Period Weeks    Status Achieved    Target Date 09/06/17      PT SHORT TERM GOAL #4   Title Patient will perform STS with single UE support to decrease reliance upon UE's for stability     Baseline patient requires BUE support 11/5: 1 UR support    Time 2    Period Months    Status Achieved    Target Date 11/02/17             PT Long Term Goals - 11/04/19 1517      PT LONG TERM GOAL #1   Title Patient will increase six minute walk test distance to >1000 with LRAD for progression to community ambulator and improve gait ability    Baseline 3/3: 700 ft, terminated early 4/12: deferred due to walking down to PT gym 4/14: 740 ft 6 minutes with one seated rest break 5/24: 800 ft with [facility] 6/23: 600 ft terminated due to back pain; 8/18: 959f in 4'15" (prior) 10/25. stopped  after 1000 ft    Time 8    Period Weeks    Status Achieved      PT LONG TERM GOAL #2   Title Patient will increase FOTO score to equal to or greater than  57/100 to demonstrate statistically significant improved mobility and quality of life.    Baseline 3/3: 47/100 risk adjusted 43/100 4/12: 41/100 4/28: 50/100 5/24: 46% 6/23: 47%; 8/18:60%    Time 8    Period Weeks    Status Achieved      PT LONG TERM GOAL #3   Title Patient will increase Berg Balance score by > 6 points (47/56) to demonstrate decreased fall risk during functional activities.    Baseline 3/3: 41/56 4/12; 49/56    Time 8    Period Weeks    Status Achieved      PT LONG TERM GOAL #4   Title Patient will increase BLE gross strength to 4+/5 as to improve functional strength for independent gait, increased standing tolerance and increased ADL ability.    Baseline 3/3: LLE grossly 4-/5 R grossly 3+ hip 4- knee 4/12: LLE grossly 4- to 4/5 with R 4- hip 6/23: did not tolerate resistance due to back pain 10/25: grossly 4+/5    Time 8    Period Weeks    Status Achieved      PT LONG TERM GOAL #5   Title Patient will increase dynamic gait index score to >19/24 as to demonstrate reduced fall risk and improved dynamic gait balance for better safety with community/home ambulation.    Baseline 4/28: 14/24 5/24: 17/24 6/24: 20/24 10/25: 20/24    Time 8    Period Weeks    Status Achieved      PT LONG TERM GOAL #6   Title Patient will be mod I for negotiating curb with LRAD while wearing prosthetic for safe community ambulation.    Baseline needs assistance at this time 10/25: can perform with [facility]    Time 8    Period Weeks    Status On-going                 Plan - 11/04/19 1351    Clinical Impression Statement Patient discharged due to functional status being able to be maintained independently at home as seen in ~ 6 week lapse from therapy with little to no changes in scores and an increase in duration of ambulation  tolerated. Patient encouraged to follow up with physician about back pain.  I will be happy to see this patient again in the future as needed.    Personal Factors and Comorbidities Age;Comorbidity 3+;Finances;Fitness;Past/Current Experience;Social Background;Time since onset of injury/illness/exacerbation;Transportation    Comorbidities DM, heart murmur, HLD, HTN, sleep apnea, cellulitis of L foot, hypomagnesemia, unilateral complete BKA R (2019), stage III CKD, PAD, legally blind, hypokalemia, AKI, and sepsis    Examination-Activity Limitations Bathing;Bed Mobility;Caring for [employer];Locomotion Level;Lift;Stand;Toileting;Transfers    Examination-Participation Restrictions Church;Cleaning;Community Activity;Driving;Interpersonal Relationship;Laundry;Shop;Meal Prep;Yard 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 complexity    Rehab Potential Good    Clinical Impairments Affecting Rehab Potential (+) previous independence, motivation to return to walking (-) lives alone, hx of diabetes, limited vision     PT Frequency One time visit    PT Duration --    PT Treatment/Interventions ADLs/Self Care Home Management;Cryotherapy;Electrical Stimulation;Ultrasound;Traction;Moist Heat;DME Instruction;Gait training;Stair training;Functional mobility training;Therapeutic activities;Therapeutic exercise;Patient/family education;Neuromuscular re-education;Balance training;Prosthetic Training;Wheelchair mobility training;Manual techniques;Manual lymph drainage;Compression bandaging;Taping;Energy conservation;Passive range of motion;Aquatic Therapy;Vestibular    PT Next Visit Plan walk on beach    PT Home Exercise Plan see [id number] on medbridge, spc ambulation    Consulted and Agree with Plan of Care Patient           Patient will  benefit from skilled therapeutic intervention in order to improve the following deficits and impairments:  Abnormal gait,  Decreased activity tolerance, Decreased balance, Decreased knowledge of precautions, Decreased endurance, Decreased knowledge of use of DME, Decreased mobility, Decreased range of motion, Difficulty walking, Decreased strength, Increased edema, Impaired flexibility, Impaired perceived functional ability, Prosthetic Dependency, Postural dysfunction, Improper body mechanics, Pain, Decreased skin integrity, Impaired vision/preception, Impaired sensation  Visit Diagnosis: Muscle weakness (generalized)  Unsteadiness on feet  Other abnormalities of gait and mobility  Unilateral complete BKA, right, sequela ([place])     Problem List Patient Active Problem List   Diagnosis Date Noted  . Elevated lactic acid level   . Diabetic ketoacidosis without coma associated with type 2 diabetes mellitus ([place])   . Cellulitis of left foot 12/17/2018  . Pain due to onychomycosis of toenail of right foot 07/05/2018  . Abnormality of gait 10/12/2017  . Type 2 diabetes mellitus with hyperglycemia, with long-term current use of insulin ([place])   . Flatulence   . Hypomagnesemia   . Unilateral complete BKA, right, sequela ([place])   . Benign essential HTN   . Hypoalbuminemia due to protein-calorie malnutrition ([place])   . S/P below knee amputation, right ([place]) 04/12/2017  . Acute blood loss anemia   . Other encephalopathy 04/08/2017  . Encephalopathy 04/08/2017  . Altered mental status 04/08/2017  . Labile blood pressure   . Labile blood glucose   . Drug induced constipation   . Stage 3 chronic kidney disease ([place])   . Bacteremia   . S/P unilateral BKA (below knee amputation), right ([place]) 04/04/2017  . PAD (peripheral artery disease) ([place])   . Type 2 diabetes mellitus with right diabetic foot ulcer ([place])   . Post-operative pain   . Legally blind   . Upper GI bleed   . Streptococcal bacteremia 04/01/2017  . Hypokalemia 03/30/2017  . Uncontrolled type 2 diabetes mellitus with hyperglycemia, with long-term  current use of insulin ([place]) 03/30/2017  . Type 2 diabetes mellitus with peripheral neuropathy ([place]) 03/30/2017  . AKI (acute kidney injury) ([place]) 03/30/2017  . CKD stage 3 due to type 2 diabetes mellitus ([place]) 03/30/2017  . Sepsis ([place]) 03/30/2017  . Heart murmur 11/22/2016  . Hyperlipidemia associated with type 2 diabetes mellitus ([place]) 11/22/2016  . Obstructive sleep apnea syndrome 11/22/2016  . Essential hypertension 12/17/2012    Janna Arch 11/04/2019, 3:21 PM  [place] MAIN [facility] SERVICES [address] [place], [place], [address] Phone: [phone]   Fax:  [phone]  Name: CHRISHUN SCHEER MRN: [id number] Date of Birth: 06-04-1955

## 2019-11-06 ENCOUNTER — Ambulatory Visit: Payer: Medicare Other

## 2019-11-11 ENCOUNTER — Ambulatory Visit: Payer: Medicare Other

## 2019-11-13 ENCOUNTER — Ambulatory Visit: Payer: Medicare Other

## 2019-11-18 ENCOUNTER — Ambulatory Visit: Payer: Medicare Other

## 2019-11-20 ENCOUNTER — Ambulatory Visit: Payer: Medicare Other

## 2019-11-25 ENCOUNTER — Ambulatory Visit: Payer: Medicare Other

## 2019-11-27 ENCOUNTER — Ambulatory Visit: Payer: Medicare Other

## 2019-11-28 ENCOUNTER — Encounter: Payer: Medicare Other | Admitting: Physical Medicine & Rehabilitation

## 2019-12-02 ENCOUNTER — Ambulatory Visit: Payer: Medicare Other

## 2019-12-04 ENCOUNTER — Ambulatory Visit: Payer: Medicare Other

## 2019-12-23 ENCOUNTER — Encounter: Payer: Self-pay | Admitting: Physical Medicine & Rehabilitation

## 2019-12-23 ENCOUNTER — Other Ambulatory Visit: Payer: Self-pay

## 2019-12-23 ENCOUNTER — Encounter: Payer: Medicare Other | Attending: Physical Medicine & Rehabilitation | Admitting: Physical Medicine & Rehabilitation

## 2019-12-23 VITALS — BP 168/83 | HR 74 | Temp 98.3°F | Ht 76.0 in | Wt 326.0 lb

## 2019-12-23 DIAGNOSIS — M25521 Pain in right elbow: Secondary | ICD-10-CM | POA: Diagnosis present

## 2019-12-23 DIAGNOSIS — M25522 Pain in left elbow: Secondary | ICD-10-CM | POA: Diagnosis present

## 2019-12-23 DIAGNOSIS — S88111S Complete traumatic amputation at level between knee and ankle, right lower leg, sequela: Secondary | ICD-10-CM | POA: Insufficient documentation

## 2019-12-23 DIAGNOSIS — R269 Unspecified abnormalities of gait and mobility: Secondary | ICD-10-CM | POA: Diagnosis present

## 2019-12-23 MED ORDER — DICLOFENAC SODIUM 1 % EX GEL: 2.0000 g | Freq: Four times a day (QID) | CUTANEOUS | 1 refills | Status: DC

## 2019-12-23 NOTE — Progress Notes (Signed)
Subjective:    Patient ID: Trevor [name], male    DOB: 1955/10/15, 64 y.o.   MRN: [id number]  HPI  Right-handed male with history of diabetes mellitus, hypertension, legally blind, and CKD presents for follow up for right BKA.    Last clinic visit 05/30/19.  Since that time, pt states he completed therapies. He is wearing his new prosthesis without issue. Denies falls, uses cane for safety. He complains of elbow pain and was evaluated.    Pain Inventory Average Pain 4 Pain Right Now 2 My pain is intermittent, burning and tingling  In the last 24 hours, has pain interfered with the following? General activity 2 Relation with others 2 Enjoyment of life 1 What TIME of day is your pain at its worst? varies Sleep (in general) Good  Pain is worse with: walking and standing Pain improves with: rest Relief from Meds: n/a  Mobility walk with assistance use a cane ability to climb steps?  yes do you drive?  yes  Function disabled: date disabled . retired I need assistance with the following:  dressing, meal prep, household duties and shopping  Neuro/Psych trouble walking  Physicians involved in your care Any changes since last visit?  no   No family history on file. Social History   Socioeconomic History  . Marital status: Single    Spouse name: Not on file  . Number of children: Not on file  . Years of education: Not on file  . Highest education level: Not on file  Occupational History  . Not on file  Tobacco Use  . Smoking status: Never Smoker  . Smokeless tobacco: Never Used  Vaping Use  . Vaping Use: Never used  Substance and Sexual Activity  . Alcohol use: No  . Drug use: Never  . Sexual activity: Not on file  Other Topics Concern  . Not on file  Social History Narrative  . Not on file   Social Determinants of Health   Financial Resource Strain: Not on file  Food Insecurity: Not on file  Transportation Needs: Not on file  Physical Activity: Not  on file  Stress: Not on file  Social Connections: Not on file   Past Surgical History:  Procedure Laterality Date  . AMPUTATION Right 03/31/2017   Procedure: RIGHT FOOT 1ST AND 2ND RAY AMPUTATION;  Surgeon: Newt Minion, MD;  Location: [place];  Service: Orthopedics;  Laterality: Right;  . AMPUTATION Right 04/01/2017   Procedure: AMPUTATION BELOW KNEE;  Surgeon: Newt Minion, MD;  Location: [place];  Service: Orthopedics;  Laterality: Right;  . COLONOSCOPY WITH PROPOFOL N/A 10/02/2017   Procedure: COLONOSCOPY WITH PROPOFOL;  Surgeon: [name], MD;  Location: [facility] ENDOSCOPY;  Service: Gastroenterology;  Laterality: N/A;  . CORNEAL TRANSPLANT     Past Medical History:  Diagnosis Date  . COVID-29 Dec 2018  . Diabetes mellitus without complication ([name])   . Heart murmur   . Hyperlipidemia   . Hypertension   . Sleep apnea    BP (!) 168/83   Pulse 74   Temp 98.3 F (36.8 C)   Ht 6\' 4"  (1.93 m)   Wt (!) 326 lb (147.9 kg)   SpO2 96%   BMI 39.68 kg/m   Opioid Risk Score:   Fall Risk Score:  `1  Depression screen PHQ 2/9  No flowsheet data found.  Review of Systems  Constitutional: Negative.   HENT: Negative.   Eyes: Negative.  Respiratory: Negative.   Cardiovascular: Negative.   Gastrointestinal: Negative.   Endocrine: Negative.        High blood sugar  Genitourinary: Negative.   Musculoskeletal: Positive for arthralgias, back pain and gait problem.  Skin: Negative.   Allergic/Immunologic: Negative.   Neurological:       Tingling   Hematological: Negative.   Psychiatric/Behavioral: Negative.       Objective:   Physical Exam  Constitutional: No distress . Vital signs reviewed. HENT: Normocephalic.  Atraumatic. Eyes: EOMI. No discharge. Cardiovascular: No JVD.   Respiratory: Normal effort.  No stridor.   GI: Non-distended.   Skin: Warm and dry.  Intact. BKA healed. Psych: Normal mood.  Normal behavior. Musc: No edema in extremities.  No tenderness in  extremities. Neuro: Alert Motor:  LLE: 5/5 proximal to distal     Assessment & Plan:  Right-handed male with history of diabetes mellitus, hypertension, legally blind, and CKD presents for follow up for right BKA.    1.  Right transtibial amputation              Continue HEP             Released by Ortho             New prosthesis obtained ~06/2018, good function after prosthesis  2. Gait abnormality             Continue HEP             Cont cane for safety  3. B/l elbow pain  Will order Volaren gel  Patient would like to RTC in 1 year

## 2019-12-24 ENCOUNTER — Ambulatory Visit: Payer: Medicare Other | Admitting: Podiatry

## 2019-12-26 ENCOUNTER — Other Ambulatory Visit: Payer: Self-pay

## 2019-12-26 ENCOUNTER — Ambulatory Visit (INDEPENDENT_AMBULATORY_CARE_PROVIDER_SITE_OTHER): Payer: Medicare Other | Admitting: Podiatry

## 2019-12-26 DIAGNOSIS — L853 Xerosis cutis: Secondary | ICD-10-CM

## 2019-12-26 DIAGNOSIS — B351 Tinea unguium: Secondary | ICD-10-CM | POA: Diagnosis not present

## 2019-12-26 DIAGNOSIS — M79675 Pain in left toe(s): Secondary | ICD-10-CM

## 2019-12-26 DIAGNOSIS — R234 Changes in skin texture: Secondary | ICD-10-CM | POA: Diagnosis not present

## 2019-12-26 DIAGNOSIS — E1142 Type 2 diabetes mellitus with diabetic polyneuropathy: Secondary | ICD-10-CM

## 2019-12-26 MED ORDER — AMMONIUM LACTATE 12 % EX LOTN: 1.0000 "application " | TOPICAL_LOTION | CUTANEOUS | 1 refills | Status: AC | PRN

## 2019-12-26 MED ORDER — CICLOPIROX 8 % EX SOLN: Freq: Every day | CUTANEOUS | 1 refills | Status: AC

## 2019-12-27 ENCOUNTER — Encounter: Payer: Self-pay | Admitting: Podiatry

## 2019-12-27 NOTE — Progress Notes (Signed)
  Subjective:  Patient ID: Trevor [name], male    DOB: 20-Sep-1955,  MRN: [id number]  Chief Complaint  Patient presents with  . Callouses   64 y.o. male returns for the above complaint.  Patient presents with thickened elongated mycotic toenails of the left lower extremity x5.  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  Patient states that they have been painful when ambulating.  He is not able to debride down himself.  He is a type II diabetic with last A1c of 9.6.  He also has secondary complaint of dry skin to both lower extremity would like to discuss treatment options.  Objective:  There were no vitals filed for this visit. Podiatric Exam: Vascular: dorsalis pedis and posterior tibial pulses are palpable left capillary return is immediate. Temperature gradient is WNL. Skin turgor WNL  Sensorium: Normal Semmes Weinstein monofilament test. Normal tactile sensation left Nail Exam: Pt has thick disfigured discolored nails with subungual debris noted bilateral entire nail hallux through fifth toenails Ulcer Exam: There is no evidence of ulcer or pre-ulcerative changes or infection. Orthopedic Exam: Muscle tone and strength are WNL. No limitations in general ROM. No crepitus or effusions noted. HAV   left.  Hammer toes 2-5   left.  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 No infection or ulcers.  Severe dryness noted to both lower extremity plantar  Assessment & Plan:  Patient was evaluated and treated and all questions answered.  Xerosis/fissuring plantar foot -I explained to the patient the etiology of xerosis and various treatment options were extensively discussed.  I explained to the patient the importance of maintaining moisturization of the skin with application of over-the-counter lotion such as Eucerin or Luciderm.  Given that patient has failed over-the-counter therapy I believe patient will benefit from  ammonium lactate cream.  Ammonium lactate was sent to the pharmacy.   Swelling to the left lower extremity -Improving.  Onychomycosis with pain x5 -Nails palliatively debrided as below. -Educated on self-care -Penlac was also dispensed to help with topical application decrease the fungal growth.  I have asked him to apply twice a day for 6 months.  Patient states understanding  Procedure: Nail Debridement Rationale: pain  Type of Debridement: manual, sharp debridement. Instrumentation: Nail nipper, rotary burr. Number of Nails: 5  Procedures and Treatment: Consent by patient was obtained for treatment procedures. The patient understood the discussion of treatment and procedures well. All questions were answered thoroughly reviewed. Debridement of mycotic and hypertrophic toenails, 1 through 5 bilateral and clearing of subungual debris. No ulceration, no infection noted.  Return Visit-Office Procedure: Patient instructed to return to the office for a follow up visit 3 months for continued evaluation and treatment.  Boneta Lucks, DPM    No follow-ups on file.

## 2020-01-02 ENCOUNTER — Telehealth: Payer: Self-pay

## 2020-01-02 NOTE — Telephone Encounter (Signed)
Insurance required prior auth for Amlactin lotion.  I sent request to [employer], they denied, stated that it is not a covered drug.  Please advise if you want him to try anything else.

## 2020-01-05 NOTE — Telephone Encounter (Signed)
Nope, he just needs to purchase it over-the-counter. - Dr. Amalia Hailey

## 2020-01-08 NOTE — Telephone Encounter (Signed)
Called patient to inform that insurance will not cover Amlactin rx.  Left message on voice mail that he can purchase Amlactin over the counter and to call me with any questions or concerns.

## 2020-03-26 ENCOUNTER — Ambulatory Visit: Payer: Medicare Other | Admitting: Podiatry

## 2020-04-01 ENCOUNTER — Telehealth: Payer: Self-pay | Admitting: Podiatry

## 2020-04-01 NOTE — Telephone Encounter (Signed)
Called pt lvm to change appt on 3/24 to 2:15pm

## 2020-04-02 ENCOUNTER — Ambulatory Visit (INDEPENDENT_AMBULATORY_CARE_PROVIDER_SITE_OTHER): Payer: Medicare Other | Admitting: Podiatry

## 2020-04-02 ENCOUNTER — Encounter: Payer: Self-pay | Admitting: Podiatry

## 2020-04-02 ENCOUNTER — Other Ambulatory Visit: Payer: Self-pay

## 2020-04-02 DIAGNOSIS — B351 Tinea unguium: Secondary | ICD-10-CM

## 2020-04-02 DIAGNOSIS — M79675 Pain in left toe(s): Secondary | ICD-10-CM | POA: Diagnosis not present

## 2020-04-02 DIAGNOSIS — M2042 Other hammer toe(s) (acquired), left foot: Secondary | ICD-10-CM | POA: Diagnosis not present

## 2020-04-02 DIAGNOSIS — E1142 Type 2 diabetes mellitus with diabetic polyneuropathy: Secondary | ICD-10-CM

## 2020-04-02 NOTE — Progress Notes (Signed)
  Subjective:  Patient ID: Trevor [name], male    DOB: 03-23-1955,  MRN: [id number]  Chief Complaint  Patient presents with  . Nail Problem  . Callouses    Nail callous trim [facility]   65 y.o. male returns for the above complaint.  Patient presents with thickened elongated mycotic toenails of the left lower extremity x5.  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  Patient states that they have been painful when ambulating.  He is not able to debride down himself.  He is a type II diabetic with last A1c of 9.6.  He has secondary complaint of the hammertoe contractures to left second and third digit.  They are mildly pain to palpation.  He wants to know what conservative treatment options are available.  He has not tried anything.  Objective:  There were no vitals filed for this visit. Podiatric Exam: Vascular: dorsalis pedis and posterior tibial pulses are palpable left capillary return is immediate. Temperature gradient is WNL. Skin turgor WNL  Sensorium: Normal Semmes Weinstein monofilament test. Normal tactile sensation left Nail Exam: Pt has thick disfigured discolored nails with subungual debris noted bilateral entire nail hallux through fifth toenails Ulcer Exam: There is no evidence of ulcer or pre-ulcerative changes or infection. Orthopedic Exam: Muscle tone and strength are WNL. No limitations in general ROM. No crepitus or effusions noted. HAV   left.  Hammer toes 2-5   left.  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 No infection or ulcers.  Severe dryness noted to both lower extremity plantar  Assessment & Plan:  Patient was evaluated and treated and all questions answered.  Hammertoes second and third -I explained to patient the etiology of hammertoe measurement options were discussed.  I educated him on taking the pressure off of the hammertoe by getting wider shoes and making shoe gear modification.  Toe  protectors were also dispensed to take the pressure off of the hammertoe.  Ultimately patient is not an ideal surgical candidate for hammertoe correction. -Toe protectors were dispensed  Xerosis/fissuring plantar foot -I explained to the patient the etiology of xerosis and various treatment options were extensively discussed.  I explained to the patient the importance of maintaining moisturization of the skin with application of over-the-counter lotion such as Eucerin or Luciderm.  Given that patient has failed over-the-counter therapy I believe patient will benefit from ammonium lactate cream.  Ammonium lactate was sent to the pharmacy.   Swelling to the left lower extremity -Improving.  Onychomycosis with pain x5 -Nails palliatively debrided as below. -Educated on self-care -Penlac was also dispensed to help with topical application decrease the fungal growth.  I have asked him to apply twice a day for 6 months.  Patient states understanding  Procedure: Nail Debridement Rationale: pain  Type of Debridement: manual, sharp debridement. Instrumentation: Nail nipper, rotary burr. Number of Nails: 5   Procedures and Treatment: Consent by patient was obtained for treatment procedures. The patient understood the discussion of treatment and procedures well. All questions were answered thoroughly reviewed. Debridement of mycotic and hypertrophic toenails, 1 through 5 bilateral and clearing of subungual debris. No ulceration, no infection noted.  Return Visit-Office Procedure: Patient instructed to return to the office for a follow up visit 3 months for continued evaluation and treatment.  Boneta Lucks, DPM    No follow-ups on file.

## 2020-04-10 ENCOUNTER — Other Ambulatory Visit: Payer: Self-pay | Admitting: Family Medicine

## 2020-04-10 DIAGNOSIS — M79645 Pain in left finger(s): Secondary | ICD-10-CM

## 2020-04-10 DIAGNOSIS — M79644 Pain in right finger(s): Secondary | ICD-10-CM

## 2020-06-19 NOTE — Progress Notes (Signed)
Sleep Medicine   Office Visit  Patient Name: Trevor [name] DOB: November 15, 1955 MRN [id number]    Chief Complaint: restart PAP therapy. Usage stopped 12 years ago  Brief History:  [name] presents with a 13 year history of OSA. Sleep quality is very poor all the time and is noted everynight. The patient's bed partner reports  snoring at night. The patient relates the following symptoms: snoring, history of OSA - stopped using approx 1 year after being diagnosed due to mask frustration.  are also present. The patient goes to sleep at 2-3am and wakes up at 10am and is still tired  when he reports that his sleep quality is better in the chair with ottoman with left leg elevated. (Right leg is prosthetic.[name]  Sleep quality is worse when outside home environment.  Patient has noted no symptoms of his legs at night.  The patient  relates no unusual behavior during the night.  The patient denies a history of psychiatric problems. The Epworth Sleepiness Score is 11 out of 24 .  The patient relates  Cardiovascular risk factors include: hypertension     ROS  General: (-) fever, (-) chills, (-) night sweat Nose and Sinuses: (-) nasal stuffiness or itchiness, (-) postnasal drip, (-) nosebleeds, (-) sinus trouble. Mouth and Throat: (-) sore throat, (-) hoarseness. Neck: (-) swollen glands, (-) enlarged thyroid, (-) neck pain. Respiratory: - cough, - shortness of breath, - wheezing. Neurologic: - numbness, - tingling. Psychiatric: - anxiety, - depression Sleep behavior: -sleep paralysis -hypnogogic hallucinations -dream enactment      -vivid dreams -cataplexy -night terrors -sleep walking   Current Medication: Outpatient Encounter Medications as of 06/22/2020  Medication Sig Note   ACCU-CHEK AVIVA PLUS test strip USE AS DIRECTED TO CHECK BLOOD SUGAR THREE TIMES A DAY    acetaminophen (TYLENOL) 325 MG tablet Take 2 tablets (650 mg total) by mouth every 6 (six) hours as needed for mild pain (or Fever >/=  101).    albuterol (VENTOLIN HFA) 108 (90 Base) MCG/ACT inhaler Inhale 2 puffs into the lungs every 6 (six) hours as needed for wheezing or shortness of breath.    ammonium lactate (AMLACTIN) 12 % lotion Apply 1 application topically as needed for dry skin.    ammonium lactate (LAC-HYDRIN) 12 % cream Apply topically as needed for dry skin.    amoxicillin-clavulanate (AUGMENTIN) 875-125 MG tablet Take 1 tablet by mouth every 12 (twelve) hours.    aspirin EC 81 MG tablet Take 81 mg by mouth daily.    atenolol (TENORMIN) 100 MG tablet Take 100 mg by mouth daily.     atorvastatin (LIPITOR) 40 MG tablet Take 40 mg by mouth daily.    BD INSULIN SYRINGE U/F 31G X 5/16" 1 ML MISC     cephALEXin (KEFLEX) 500 MG capsule Take 500 mg by mouth 4 (four) times daily.    ciclopirox (PENLAC) 8 % solution Apply topically at bedtime. Apply over nail and surrounding skin. Apply daily over previous coat. After seven (7) days, may remove with alcohol and continue cycle.    clobetasol cream (TEMOVATE) 8.18 % Apply 1 application topically 2 (two) times daily.    clobetasol ointment (TEMOVATE) 2.99 % Apply 1 application topically 2 (two) times daily.    diclofenac Sodium (VOLTAREN) 1 % GEL Apply 2 g topically 4 (four) times daily.    fluticasone (FLONASE) 50 MCG/ACT nasal spray Place 1 spray into both nostrils daily.    glipiZIDE (GLUCOTROL) 10 MG tablet Take  10 mg by mouth 2 (two) times daily before a meal.    insulin aspart (NOVOLOG) 100 UNIT/ML FlexPen Inject 10 Units into the skin 3 (three) times daily with meals. 05/03/2018: As needed   insulin detemir (LEVEMIR) 100 unit/ml SOLN Inject 0.34 mLs (34 Units total) into the skin at bedtime.    lactulose (CHRONULAC) 10 GM/15ML solution Take 30 mLs (20 g total) by mouth daily as needed for mild constipation.    LEVEMIR 100 UNIT/ML injection Inject into the skin.    losartan (COZAAR) 100 MG tablet Take 100 mg by mouth daily.    metFORMIN (GLUCOPHAGE) 1000 MG tablet Take  1,000 mg by mouth 2 (two) times daily.    NIFEdipine (PROCARDIA-XL/NIFEDICAL-XL) 30 MG 24 hr tablet Take 120 mg by mouth daily.    polyethylene glycol (MIRALAX / GLYCOLAX) packet Take 17 g by mouth daily as needed for mild constipation.    potassium chloride 20 MEQ TBCR Take 40 mEq by mouth 2 (two) times daily for 3 days.    potassium chloride SA (KLOR-CON M20) 20 MEQ tablet Take 1 tablet (20 mEq total) by mouth daily.    spironolactone (ALDACTONE) 25 MG tablet Take 25 mg by mouth daily.    traMADol (ULTRAM) 50 MG tablet Take 0.5-1 tablets (25-50 mg total) by mouth every 8 (eight) hours as needed for severe pain. 05/03/2018: Seldom takes, has #16 tablets today   No facility-administered encounter medications on file as of 06/22/2020.    Surgical History: Past Surgical History:  Procedure Laterality Date   AMPUTATION Right 03/31/2017   Procedure: RIGHT FOOT 1ST AND 2ND RAY AMPUTATION;  Surgeon: Newt Minion, MD;  Location: [facility];  Service: Orthopedics;  Laterality: Right;   AMPUTATION Right 04/01/2017   Procedure: AMPUTATION BELOW KNEE;  Surgeon: Newt Minion, MD;  Location: [place];  Service: Orthopedics;  Laterality: Right;   COLONOSCOPY WITH PROPOFOL N/A 10/02/2017   Procedure: COLONOSCOPY WITH PROPOFOL;  Surgeon: [name], MD;  Location: [facility] ENDOSCOPY;  Service: Gastroenterology;  Laterality: N/A;   CORNEAL TRANSPLANT      Medical History: Past Medical History:  Diagnosis Date   COVID-29 Dec 2018   Diabetes mellitus without complication (HCC)    Heart murmur    Hyperlipidemia    Hypertension    Obesity (BMI 30-39.9) 06/22/2020   Sleep apnea     Family History: Non contributory to the present illness  Social History: Social History   Socioeconomic History   Marital status: Single    Spouse name: Not on file   Number of children: Not on file   Years of education: Not on file   Highest education level: Not on file  Occupational History   Not on file  Tobacco Use    Smoking status: Never   Smokeless tobacco: Never  Vaping Use   Vaping Use: Never used  Substance and Sexual Activity   Alcohol use: No   Drug use: Never   Sexual activity: Not on file  Other Topics Concern   Not on file  Social History Narrative   Not on file   Social Determinants of Health   Financial Resource Strain: Not on file  Food Insecurity: Not on file  Transportation Needs: Not on file  Physical Activity: Not on file  Stress: Not on file  Social Connections: Not on file  Intimate Partner Violence: Not on file    Vital Signs: Blood pressure (!) 146/78, pulse 68, resp. rate 20, height  6\' 4"  (1.93 m), weight (!) 328 lb (148.8 kg), SpO2 96 %.  Examination: General Appearance: The patient is well-developed, well-nourished, and in no distress. Neck Circumference: 47 Skin: Gross inspection of skin unremarkable. Head: normocephalic, no gross deformities. Eyes: no gross deformities noted. ENT: ears appear grossly normal Neurologic: Alert and oriented. No involuntary movements.    EPWORTH SLEEPINESS SCALE:  Scale:  (0)= no chance of dozing; (1)= slight chance of dozing; (2)= moderate chance of dozing; (3)= high chance of dozing  Chance  Situtation    Sitting and reading: 3    Watching TV: 3    Sitting Inactive in public: 1    As a passenger in car: 2      Lying down to rest: 1    Sitting and talking: 0    Sitting quielty after lunch: 1    In a car, stopped in traffic: 0   TOTAL SCORE:   11 out of 24    SLEEP STUDIES:  PSG 11/14/2007 - AHI 77.2, REM AHI was slightly higher, low SpO2 85%   LABS: No results found for this or any previous visit (from the past 2160 hour(s)).  Radiology: DG Abd Acute W/Chest  Result Date: 07/29/2019 CLINICAL DATA:  No bowel movement.  Constipation. EXAM: DG ABDOMEN ACUTE W/ 1V CHEST COMPARISON:  Chest x-ray dated December 28, 2018 FINDINGS: The bowel gas pattern is nonobstructive. There is a large amount of  stool in the colon. There are subtle patchy airspace opacities bilaterally which have significantly improved from prior study. The heart size is stable. There is no pneumothorax. There is a chronic appearing deformity of the proximal left femur. IMPRESSION: 1. Above average stool burden. 2. Nonobstructive bowel gas pattern. 3. Subtle patchy airspace opacities bilaterally, significantly improved since prior chest x-ray dated 12/28/2018. Electronically Signed   By: Constance Holster M.D.   On: 07/29/2019 01:06    No results found.  No results found.    Assessment and Plan: Patient Active Problem List   Diagnosis Date Noted   OSA (obstructive sleep apnea) 06/22/2020   Obesity (BMI 30-39.9) 06/22/2020   Pain of both elbows 12/23/2019   Elevated lactic acid level    Diabetic ketoacidosis without coma associated with type 2 diabetes mellitus ([place])    Cellulitis of left foot 12/17/2018   Pain due to onychomycosis of toenail of right foot 07/05/2018   Abnormality of gait 10/12/2017   Type 2 diabetes mellitus with hyperglycemia, with long-term current use of insulin (HCC)    Flatulence    Hypomagnesemia    Unilateral complete BKA, right, sequela (HCC)    Benign essential HTN    Hypoalbuminemia due to protein-calorie malnutrition ([place])    S/P below knee amputation, right ([place]) 04/12/2017   Acute blood loss anemia    Other encephalopathy 04/08/2017   Encephalopathy 04/08/2017   Altered mental status 04/08/2017   Labile blood pressure    Labile blood glucose    Drug induced constipation    Stage 3 chronic kidney disease ([place])    Bacteremia    S/P unilateral BKA (below knee amputation), right ([place]) 04/04/2017   PAD (peripheral artery disease) ([place])    Type 2 diabetes mellitus with right diabetic foot ulcer ([place])    Post-operative pain    Legally blind    Upper GI bleed    Streptococcal bacteremia 04/01/2017   Hypokalemia 03/30/2017   Uncontrolled type 2 diabetes mellitus with  hyperglycemia, with long-term current use of insulin ([place])  03/30/2017   Type 2 diabetes mellitus with peripheral neuropathy ([place]) 03/30/2017   AKI (acute kidney injury) ([place]) 03/30/2017   CKD stage 3 due to type 2 diabetes mellitus ([place]) 03/30/2017   Sepsis ([place]) 03/30/2017   Heart murmur 11/22/2016   Hyperlipidemia associated with type 2 diabetes mellitus ([place]) 11/22/2016   Obstructive sleep apnea syndrome 11/22/2016   Essential hypertension 12/17/2012   1. OSA (obstructive sleep apnea) PLAN OSA:   Patient evaluation reveals severe OSA based on PSG of 11-14-2007. Suggest: repeat titration study given significant weight loss  to assess the patient's sleep disordered breathing. The patient was also counselled on weight loss to optimize sleep health. He is willing to give CPAP another try. He has lost the power cord to his machine. He will need a replacement.  OSA- severe. Needs new machine, and a repeat CPAP titration to evaluate proper pressures.   2. Obesity (BMI 30-39.9) Obesity Counseling: Had a lengthy discussion regarding patients BMI and weight issues. Patient was instructed on portion control as well as increased activity. Also discussed caloric restrictions with trying to maintain intake less than 2000 Kcal. Discussions were made in accordance with the 5As of weight management. Simple actions such as not eating late and if able to, taking a walk is suggested.   3. Essential hypertension Hypertension Counseling:   The following hypertensive lifestyle modification were recommended and discussed:  1. Limiting alcohol intake to less than 1 oz/day of ethanol:(24 oz of beer or 8 oz of wine or 2 oz of 100-proof whiskey). 2. Take baby ASA 81 mg daily. 3. Importance of regular aerobic exercise and losing weight. 4. Reduce dietary saturated fat and cholesterol intake for overall cardiovascular health. 5. Maintaining adequate dietary potassium, calcium, and magnesium intake. 6. Regular  monitoring of the blood pressure. 7. Reduce sodium intake to less than 100 mmol/day (less than 2.3 gm of sodium or less than 6 gm of sodium choride)     PLAN OSA:   Patient evaluation reveals severe OSA based on PSG of 11-14-2007. Suggest: repeat titration study given significant weight loss  to assess the patient's sleep disordered breathing. The patient was also counselled on weight loss to optimize sleep health. He is willing to give CPAP another try. He has lost the power cord to his machine. He will need a replacement.     OSA- severe. Needs new machine, and a repeat CPAP titration to evaluate proper pressures.   General Counseling: I have discussed the findings of the evaluation and examination with Janeann Forehand.  I have also discussed any further diagnostic evaluation thatmay be needed or ordered today. General verbalizes understanding of the findings of todays visit. We also reviewed his medications today and discussed drug interactions and side effects including but not limited excessive drowsiness and altered mental states. We also discussed that there is always a risk not just to him but also people around him. he has been encouraged to call the office with any questions or concerns that should arise related to todays visit.  No orders of the defined types were placed in this encounter.       I have personally obtained a history, evaluated the patient, evaluated pertinent data, formulated the assessment and plan and placed orders.   This patient was seen today by Tressie Ellis, PA-C in collaboration with Dr. Devona Konig.  Allyne Gee, MD [facility] Diplomate ABMS Pulmonary and Critical Care Medicine Sleep medicine

## 2020-06-22 ENCOUNTER — Ambulatory Visit (INDEPENDENT_AMBULATORY_CARE_PROVIDER_SITE_OTHER): Payer: Medicare Other | Admitting: Internal Medicine

## 2020-06-22 VITALS — BP 146/78 | HR 68 | Resp 20 | Ht 76.0 in | Wt 328.0 lb

## 2020-06-22 DIAGNOSIS — E669 Obesity, unspecified: Secondary | ICD-10-CM | POA: Insufficient documentation

## 2020-06-22 DIAGNOSIS — I10 Essential (primary) hypertension: Secondary | ICD-10-CM | POA: Diagnosis not present

## 2020-06-22 DIAGNOSIS — G4733 Obstructive sleep apnea (adult) (pediatric): Secondary | ICD-10-CM | POA: Diagnosis not present

## 2020-06-22 HISTORY — DX: Obesity, unspecified: E66.9

## 2020-06-22 NOTE — Patient Instructions (Signed)

## 2020-07-07 ENCOUNTER — Ambulatory Visit (INDEPENDENT_AMBULATORY_CARE_PROVIDER_SITE_OTHER): Payer: Medicare Other | Admitting: Podiatry

## 2020-07-07 DIAGNOSIS — E1142 Type 2 diabetes mellitus with diabetic polyneuropathy: Secondary | ICD-10-CM

## 2020-07-07 DIAGNOSIS — B351 Tinea unguium: Secondary | ICD-10-CM

## 2020-07-07 DIAGNOSIS — M79675 Pain in left toe(s): Secondary | ICD-10-CM | POA: Diagnosis not present

## 2020-07-07 NOTE — Progress Notes (Signed)
Trevor [name] fell in the parking lot around 3:50 pm while arriving for his appointment.  He called inside for assistance.  I went out, assessed him, and then rolled him into the office in a wheelchair.  He was a little shook.  He stated his prosthesis hit the side of the sidewalk and he tumbled over.  He had an abrasion below his right elbow.  I cleansed the area with wound cleanser, applied triple antibiotic ointment, and then applied a dry sterile dressing. - Mammie Russian, [place], [place]

## 2020-07-08 ENCOUNTER — Encounter: Payer: Self-pay | Admitting: Podiatry

## 2020-07-08 ENCOUNTER — Telehealth: Payer: Self-pay | Admitting: *Deleted

## 2020-07-08 NOTE — Telephone Encounter (Signed)
I attempted to call the patient to see how he is doing.  He did not answer the phone.  I left him a message to call me back.[name]

## 2020-07-08 NOTE — Progress Notes (Signed)
  Subjective:  Patient ID: Trevor [name], male    DOB: 08/25/1955,  MRN: [id number]  No chief complaint on file.  65 y.o. male returns for the above complaint.  Patient presents with thickened elongated mycotic toenails of the left lower extremity x5.  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  Patient states that they have been painful when ambulating.  He is not able to debride down himself.  He is a type II diabetic with last A1c of 9.6.  He does not have any secondary complaints today.    Objective:  There were no vitals filed for this visit. Podiatric Exam: Vascular: dorsalis pedis and posterior tibial pulses are palpable left capillary return is immediate. Temperature gradient is WNL. Skin turgor WNL  Sensorium: Normal Semmes Weinstein monofilament test. Normal tactile sensation left Nail Exam: Pt has thick disfigured discolored nails with subungual debris noted bilateral entire nail hallux through fifth toenails Ulcer Exam: There is no evidence of ulcer or pre-ulcerative changes or infection. Orthopedic Exam: Muscle tone and strength are WNL. No limitations in general ROM. No crepitus or effusions noted. HAV   left.  Hammer toes 2-5   left.  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 No infection or ulcers.  Severe dryness noted to both lower extremity plantar  Assessment & Plan:  Patient was evaluated and treated and all questions answered.  Hammertoes second and third -I explained to patient the etiology of hammertoe measurement options were discussed.  I educated him on taking the pressure off of the hammertoe by getting wider shoes and making shoe gear modification.  Toe protectors were also dispensed to take the pressure off of the hammertoe.  Ultimately patient is not an ideal surgical candidate for hammertoe correction. -Toe protectors were dispensed  Xerosis/fissuring plantar foot -I explained to the  patient the etiology of xerosis and various treatment options were extensively discussed.  I explained to the patient the importance of maintaining moisturization of the skin with application of over-the-counter lotion such as Eucerin or Luciderm.  Given that patient has failed over-the-counter therapy I believe patient will benefit from ammonium lactate cream.  Ammonium lactate was sent to the pharmacy.   Swelling to the left lower extremity -Improving.  Onychomycosis with pain x5 -Nails palliatively debrided as below. -Educated on self-care -Penlac was also dispensed to help with topical application decrease the fungal growth.  I have asked him to apply twice a day for 6 months.  Patient states understanding  Procedure: Nail Debridement Rationale: pain  Type of Debridement: manual, sharp debridement. Instrumentation: Nail nipper, rotary burr. Number of Nails: 5   Procedures and Treatment: Consent by patient was obtained for treatment procedures. The patient understood the discussion of treatment and procedures well. All questions were answered thoroughly reviewed. Debridement of mycotic and hypertrophic toenails, 1 through 5 bilateral and clearing of subungual debris. No ulceration, no infection noted.  Return Visit-Office Procedure: Patient instructed to return to the office for a follow up visit 3 months for continued evaluation and treatment.  Boneta Lucks, DPM    No follow-ups on file.

## 2020-07-08 NOTE — Telephone Encounter (Signed)
I am calling to see how you are doing.  "I'm doing okay.  My arm is just a little sore.  I'm waiting on my package to arrive so I can change the dressing."  Glad to hear you are okay.  We're here if you need [place].

## 2020-08-18 ENCOUNTER — Other Ambulatory Visit: Payer: Self-pay

## 2020-08-18 ENCOUNTER — Telehealth: Payer: Self-pay | Admitting: Gastroenterology

## 2020-08-18 DIAGNOSIS — Z86010 Personal history of colonic polyps: Secondary | ICD-10-CM

## 2020-08-18 MED ORDER — PEG 3350-KCL-NA BICARB-NACL 420 G PO SOLR
ORAL | 0 refills | Status: AC
Start: 2020-08-18 — End: ?

## 2020-08-18 NOTE — Telephone Encounter (Signed)
Patient wants to schedule 60yrColonoscopy(recall). Clinical Staff will follow up with patient.

## 2020-08-18 NOTE — Telephone Encounter (Signed)
Patient was contacted and he agreed on getting his colonoscopy on 11/06/2020.  Patient requested to have his instructions mailed to [address], [place] [id number].

## 2020-10-08 ENCOUNTER — Ambulatory Visit: Payer: Medicare Other | Admitting: Podiatry

## 2020-10-29 ENCOUNTER — Other Ambulatory Visit: Payer: Self-pay

## 2020-10-29 ENCOUNTER — Ambulatory Visit (INDEPENDENT_AMBULATORY_CARE_PROVIDER_SITE_OTHER): Payer: Medicare Other | Admitting: Podiatry

## 2020-10-29 DIAGNOSIS — M79675 Pain in left toe(s): Secondary | ICD-10-CM | POA: Diagnosis not present

## 2020-10-29 DIAGNOSIS — B351 Tinea unguium: Secondary | ICD-10-CM | POA: Diagnosis not present

## 2020-10-29 DIAGNOSIS — E1142 Type 2 diabetes mellitus with diabetic polyneuropathy: Secondary | ICD-10-CM | POA: Diagnosis not present

## 2020-11-04 ENCOUNTER — Encounter: Payer: Self-pay | Admitting: Podiatry

## 2020-11-04 NOTE — Progress Notes (Signed)
  Subjective:  Patient ID: HRIDAY STAI, male    DOB: [birth year],  MRN: [id number]  Chief Complaint  Patient presents with   Nail Problem    Nail trim     65 y.o. male returns for the above complaint.  Patient presents with thickened elongated mycotic toenails of the left lower extremity x5.  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  Patient states that they have been painful when ambulating.  He is not able to debride down himself.  He is a type II diabetic with last A1c of 9.6.  He does not have any secondary complaints today.    Objective:  There were no vitals filed for this visit. Podiatric Exam: Vascular: dorsalis pedis and posterior tibial pulses are palpable left capillary return is immediate. Temperature gradient is WNL. Skin turgor WNL  Sensorium: Normal Semmes Weinstein monofilament test. Normal tactile sensation left Nail Exam: Pt has thick disfigured discolored nails with subungual debris noted bilateral entire nail hallux through fifth toenails Ulcer Exam: There is no evidence of ulcer or pre-ulcerative changes or infection. Orthopedic Exam: Muscle tone and strength are WNL. No limitations in general ROM. No crepitus or effusions noted. HAV   left.  Hammer toes 2-5   left.  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 No infection or ulcers.  Severe dryness noted to both lower extremity plantar  Assessment & Plan:  Patient was evaluated and treated and all questions answered.  Hammertoes second and third -I explained to patient the etiology of hammertoe measurement options were discussed.  I educated him on taking the pressure off of the hammertoe by getting wider shoes and making shoe gear modification.  Toe protectors were also dispensed to take the pressure off of the hammertoe.  Ultimately patient is not an ideal surgical candidate for hammertoe correction. -Toe protectors were  dispensed  Xerosis/fissuring plantar foot -I explained to the patient the etiology of xerosis and various treatment options were extensively discussed.  I explained to the patient the importance of maintaining moisturization of the skin with application of over-the-counter lotion such as Eucerin or Luciderm.  Given that patient has failed over-the-counter therapy I believe patient will benefit from ammonium lactate cream.  Ammonium lactate was sent to the pharmacy.   Swelling to the left lower extremity -Improving.  Onychomycosis with pain x5 -Nails palliatively debrided as below. -Educated on self-care -Penlac was also dispensed to help with topical application decrease the fungal growth.  I have asked him to apply twice a day for 6 months.  Patient states understanding  Procedure: Nail Debridement Rationale: pain  Type of Debridement: manual, sharp debridement. Instrumentation: Nail nipper, rotary burr. Number of Nails: 5   Procedures and Treatment: Consent by patient was obtained for treatment procedures. The patient understood the discussion of treatment and procedures well. All questions were answered thoroughly reviewed. Debridement of mycotic and hypertrophic toenails, 1 through 5 bilateral and clearing of subungual debris. No ulceration, no infection noted.  Return Visit-Office Procedure: Patient instructed to return to the office for a follow up visit 3 months for continued evaluation and treatment.  Boneta Lucks, DPM    No follow-ups on file.

## 2020-11-05 ENCOUNTER — Encounter: Payer: Self-pay | Admitting: Gastroenterology

## 2020-11-06 ENCOUNTER — Encounter
Admission: RE | Disposition: A | Discharge status: Home / Self Care | Payer: Self-pay | Source: Home / Self Care | Attending: Gastroenterology

## 2020-11-06 ENCOUNTER — Ambulatory Visit: Payer: Medicare Other | Admitting: Certified Registered Nurse Anesthetist

## 2020-11-06 ENCOUNTER — Ambulatory Visit
Admission: RE | Admit: 2020-11-06 | Discharge: 2020-11-06 | Disposition: A | Discharge status: Home / Self Care | Payer: Medicare Other | Attending: Gastroenterology | Admitting: Gastroenterology

## 2020-11-06 ENCOUNTER — Encounter: Payer: Self-pay | Admitting: Gastroenterology

## 2020-11-06 DIAGNOSIS — E119 Type 2 diabetes mellitus without complications: Secondary | ICD-10-CM | POA: Diagnosis not present

## 2020-11-06 DIAGNOSIS — Z794 Long term (current) use of insulin: Secondary | ICD-10-CM | POA: Diagnosis not present

## 2020-11-06 DIAGNOSIS — Z86010 Personal history of colonic polyps: Secondary | ICD-10-CM | POA: Diagnosis present

## 2020-11-06 DIAGNOSIS — Z7982 Long term (current) use of aspirin: Secondary | ICD-10-CM | POA: Diagnosis not present

## 2020-11-06 DIAGNOSIS — Z8616 Personal history of COVID-19: Secondary | ICD-10-CM | POA: Diagnosis not present

## 2020-11-06 DIAGNOSIS — Z1211 Encounter for screening for malignant neoplasm of colon: Secondary | ICD-10-CM | POA: Insufficient documentation

## 2020-11-06 DIAGNOSIS — Z888 Allergy status to other drugs, medicaments and biological substances status: Secondary | ICD-10-CM | POA: Insufficient documentation

## 2020-11-06 DIAGNOSIS — Z79899 Other long term (current) drug therapy: Secondary | ICD-10-CM | POA: Diagnosis not present

## 2020-11-06 HISTORY — DX: Other complications of anesthesia, initial encounter: T88.59XA

## 2020-11-06 HISTORY — PX: COLONOSCOPY WITH PROPOFOL: SHX5780

## 2020-11-06 LAB — GLUCOSE, CAPILLARY: Glucose-Capillary: 129 mg/dL — ABNORMAL HIGH (ref 70–99)

## 2020-11-06 SURGERY — COLONOSCOPY WITH PROPOFOL
Anesthesia: General

## 2020-11-06 MED ORDER — LIDOCAINE HCL (PF) 1 % IJ SOLN
INTRAMUSCULAR | Status: AC
  Filled 2020-11-06: qty 2

## 2020-11-06 MED ORDER — PROPOFOL 10 MG/ML IV BOLUS
INTRAVENOUS | Status: DC | PRN
Start: 2020-11-06 — End: 2020-11-06
  Administered 2020-11-06 (×2): 20 mg via INTRAVENOUS

## 2020-11-06 MED ORDER — SODIUM CHLORIDE 0.9 % IV SOLN
INTRAVENOUS | Status: DC
  Administered 2020-11-06: 1000 mL via INTRAVENOUS

## 2020-11-06 MED ORDER — LIDOCAINE HCL (CARDIAC) PF 100 MG/5ML IV SOSY
PREFILLED_SYRINGE | INTRAVENOUS | Status: DC | PRN
  Administered 2020-11-06: 50 mg via INTRAVENOUS

## 2020-11-06 MED ORDER — PROPOFOL 500 MG/50ML IV EMUL
INTRAVENOUS | Status: AC
  Filled 2020-11-06: qty 50

## 2020-11-06 MED ORDER — FENTANYL CITRATE (PF) 100 MCG/2ML IJ SOLN
INTRAMUSCULAR | Status: DC | PRN
  Administered 2020-11-06: 25 ug via INTRAVENOUS

## 2020-11-06 MED ORDER — MIDAZOLAM HCL 2 MG/2ML IJ SOLN
INTRAMUSCULAR | Status: DC | PRN
  Administered 2020-11-06: 2 mg via INTRAVENOUS

## 2020-11-06 MED ORDER — MIDAZOLAM HCL 2 MG/2ML IJ SOLN
INTRAMUSCULAR | Status: AC
  Filled 2020-11-06: qty 2

## 2020-11-06 MED ORDER — FENTANYL CITRATE (PF) 100 MCG/2ML IJ SOLN
INTRAMUSCULAR | Status: AC
  Filled 2020-11-06: qty 2

## 2020-11-06 MED ORDER — PROPOFOL 500 MG/50ML IV EMUL
INTRAVENOUS | Status: DC | PRN
  Administered 2020-11-06: 50 ug/kg/min via INTRAVENOUS

## 2020-11-06 NOTE — H&P (Signed)
[name], MD [address] [place], [place], [place], [address] [address], [place], [place], [place], [address] Phone: [phone]  Fax: [phone]  Primary Care Physician:  [name], MD   Pre-Procedure History & Physical: HPI:  Trevor [name] is a 65 y.o. male is here for an colonoscopy.   Past Medical History:  Diagnosis Date   Complication of anesthesia    COVID-29 Dec 2018   Diabetes mellitus without complication ([place])    Heart murmur    Hyperlipidemia    Hypertension    Obesity (BMI 30-39.9) 06/22/2020   Sleep apnea     Past Surgical History:  Procedure Laterality Date   AMPUTATION Right 03/31/2017   Procedure: RIGHT FOOT 1ST AND 2ND RAY AMPUTATION;  Surgeon: Newt Minion, MD;  Location: [facility];  Service: Orthopedics;  Laterality: Right;   AMPUTATION Right 04/01/2017   Procedure: AMPUTATION BELOW KNEE;  Surgeon: Newt Minion, MD;  Location: [facility];  Service: Orthopedics;  Laterality: Right;   COLONOSCOPY WITH PROPOFOL N/A 10/02/2017   Procedure: COLONOSCOPY WITH PROPOFOL;  Surgeon: [name], MD;  Location: [facility] ENDOSCOPY;  Service: Gastroenterology;  Laterality: N/A;   CORNEAL TRANSPLANT     EYE SURGERY      Prior to Admission medications   Medication Sig Start Date End Date Taking? Authorizing Provider  ACCU-CHEK AVIVA PLUS test strip USE AS DIRECTED TO CHECK BLOOD SUGAR THREE TIMES A DAY 06/11/18  Yes [provider]  acetaminophen (TYLENOL) 325 MG tablet Take 2 tablets (650 mg total) by mouth every 6 (six) hours as needed for mild pain (or Fever >/= 101). 04/12/17  Yes Katherine Roan, MD  ammonium lactate (AMLACTIN) 12 % lotion Apply 1 application topically as needed for dry skin. 12/26/19  Yes Felipa Furnace, DPM  aspirin EC 81 MG tablet Take 81 mg by mouth daily.   Yes [provider]  atenolol (TENORMIN) 100 MG tablet Take 100 mg by mouth daily.    Yes [provider]  atorvastatin (LIPITOR) 40 MG tablet  Take 40 mg by mouth daily. 11/10/18  Yes [provider]  BD INSULIN SYRINGE U/F 31G X 5/16" 1 ML MISC  07/23/18  Yes [provider]  ciclopirox (PENLAC) 8 % solution Apply topically at bedtime. Apply over nail and surrounding skin. Apply daily over previous coat. After seven (7) days, may remove with alcohol and continue cycle. 12/26/19  Yes Felipa Furnace, DPM  clobetasol cream (TEMOVATE) 2.70 % Apply 1 application topically 2 (two) times daily. 10/18/19  Yes [provider]  clobetasol ointment (TEMOVATE) 6.23 % Apply 1 application topically 2 (two) times daily. 01/02/19  Yes [provider]  diclofenac Sodium (VOLTAREN) 1 % GEL Apply 2 g topically 4 (four) times daily. 12/23/19  Yes Jamse Arn, MD  glipiZIDE (GLUCOTROL) 10 MG tablet Take 10 mg by mouth 2 (two) times daily before a meal.   Yes [provider]  insulin aspart (NOVOLOG) 100 UNIT/ML FlexPen Inject 10 Units into the skin 3 (three) times daily with meals. 04/27/17  Yes Angiulli, Lavon Paganini, PA-C  insulin detemir (LEVEMIR) 100 unit/ml SOLN Inject 0.34 mLs (34 Units total) into the skin at bedtime. 04/27/17  Yes Angiulli, Lavon Paganini, PA-C  LEVEMIR 100 UNIT/ML injection Inject into the skin. 10/23/19  Yes [provider]  losartan (COZAAR) 100 MG tablet Take 100 mg by mouth daily.   Yes [provider]  metFORMIN (  GLUCOPHAGE) 1000 MG tablet Take 1,000 mg by mouth 2 (two) times daily. 02/01/19  Yes [provider]  NIFEdipine (PROCARDIA-XL/NIFEDICAL-XL) 30 MG 24 hr tablet Take 120 mg by mouth daily.   Yes [provider]  polyethylene glycol (MIRALAX / GLYCOLAX) packet Take 17 g by mouth daily as needed for mild constipation. 04/04/17  Yes Colbert Ewing, MD  potassium chloride SA (KLOR-CON M20) 20 MEQ tablet Take 1 tablet (20 mEq total) by mouth daily. 10/25/18  Yes Hinda Kehr, MD  spironolactone (ALDACTONE) 25 MG tablet Take 25 mg by mouth daily.   Yes  [provider]  albuterol (VENTOLIN HFA) 108 (90 Base) MCG/ACT inhaler Inhale 2 puffs into the lungs every 6 (six) hours as needed for wheezing or shortness of breath. 12/28/18   Duanne Guess, PA-C  ammonium lactate (LAC-HYDRIN) 12 % cream Apply topically as needed for dry skin. 02/05/18   Gardiner Barefoot, DPM  amoxicillin-clavulanate (AUGMENTIN) 875-125 MG tablet Take 1 tablet by mouth every 12 (twelve) hours. Patient not taking: Reported on 11/06/2020 12/19/18   Nolberto Hanlon, MD  cephALEXin (KEFLEX) 500 MG capsule Take 500 mg by mouth 4 (four) times daily. Patient not taking: Reported on 11/06/2020 10/04/19   [provider]  fluticasone (FLONASE) 50 MCG/ACT nasal spray Place 1 spray into both nostrils daily. 12/20/18   Nolberto Hanlon, MD  lactulose (CHRONULAC) 10 GM/15ML solution Take 30 mLs (20 g total) by mouth daily as needed for mild constipation. Patient not taking: Reported on 11/06/2020 07/29/19   Paulette Blanch, MD  polyethylene glycol-electrolytes (NULYTELY) 420 g solution Prepare according to package instructions. Starting at 5:00 PM: Drink one 8 oz glass of mixture every 15 minutes until you finish half of the jug. Five hours prior to procedure, drink 8 oz glass of mixture every 15 minutes until it is all gone. Make sure you do not drink anything 4 hours prior to your procedure. Patient not taking: Reported on 11/06/2020 08/18/20   [name], MD  potassium chloride 20 MEQ TBCR Take 40 mEq by mouth 2 (two) times daily for 3 days. 12/28/18 12/31/18  Duanne Guess, PA-C  traMADol (ULTRAM) 50 MG tablet Take 0.5-1 tablets (25-50 mg total) by mouth every 8 (eight) hours as needed for severe pain. 04/27/17   Angiulli, Lavon Paganini, PA-C    Allergies as of 08/18/2020 - Review Complete 07/08/2020  Allergen Reaction Noted   Enalapril Cough 11/22/2016    History reviewed. No pertinent family history.  Social History   Socioeconomic History   Marital status: Single    Spouse  name: Not on file   Number of children: Not on file   Years of education: Not on file   Highest education level: Not on file  Occupational History   Not on file  Tobacco Use   Smoking status: Never   Smokeless tobacco: Never  Vaping Use   Vaping Use: Never used  Substance and Sexual Activity   Alcohol use: No   Drug use: Never   Sexual activity: Not on file  Other Topics Concern   Not on file  Social History Narrative   Not on file   Social Determinants of Health   Financial Resource Strain: Not on file  Food Insecurity: Not on file  Transportation Needs: Not on file  Physical Activity: Not on file  Stress: Not on file  Social Connections: Not on file  Intimate Partner Violence: Not on file    Review of Systems: See  HPI, otherwise negative ROS  Physical Exam: BP (!) 147/91   Pulse (!) 59   Temp (!) 96.5 F (35.8 C) (Temporal)   Resp 18   Ht 6\' 4"  (1.93 m)   Wt (!) 150.4 kg   SpO2 96%   BMI 40.37 kg/m  General:   Alert,  pleasant and cooperative in NAD Head:  Normocephalic and atraumatic. Neck:  Supple; no masses or thyromegaly. Lungs:  Clear throughout to auscultation, normal respiratory effort.    Heart:  +S1, +S2, Regular rate and rhythm, No edema. Abdomen:  Soft, nontender and nondistended. Normal bowel sounds, without guarding, and without rebound.   Neurologic:  Alert and  oriented x4;  grossly normal neurologically.  Impression/Plan: THINH CUCCARO is here for an colonoscopy to be performed for surveillance due to prior history of colon polyps   Risks, benefits, limitations, and alternatives regarding  colonoscopy have been reviewed with the patient.  Questions have been answered.  All parties agreeable.   [name], MD  11/06/2020, 7:50 AM

## 2020-11-06 NOTE — Transfer of Care (Signed)
Immediate Anesthesia Transfer of Care Note  Patient: Trevor [name]  Procedure(s) Performed: COLONOSCOPY WITH PROPOFOL  Patient Location: PACU  Anesthesia Type:General  Level of Consciousness: sedated  Airway & Oxygen Therapy: Patient Spontanous Breathing and Patient connected to nasal cannula oxygen  Post-op Assessment: Report given to RN and Post -op Vital signs reviewed and stable  Post vital signs: Reviewed and stable  Last Vitals:  Vitals Value Taken Time  BP 133/78 11/06/20 0809  Temp 36.1 C 11/06/20 0809  Pulse 54 11/06/20 0810  Resp 13 11/06/20 0810  SpO2 96 % 11/06/20 0810  Vitals shown include unvalidated device data.  Last Pain:  Vitals:   11/06/20 0809  TempSrc: Tympanic  PainSc: Asleep         Complications: No notable events documented.

## 2020-11-06 NOTE — Op Note (Signed)
[facility] Patient Name: Trevor [name] Procedure Date: 11/06/2020 6:58 AM MRN: [id number] Account #: [id number] Date of Birth: 1955-12-22 Admit Type: Outpatient Age: 65 Room: [facility] ENDO ROOM 4 Gender: Male Note Status: Finalized Instrument Name: Colonoscope [id number] Procedure:             Colonoscopy Indications:           High risk colon cancer surveillance: Personal history                         of adenoma with high grade dysplasia, Last                         colonoscopy: September 2019 Providers:             [name] MD, MD Referring MD:          Edmonia Lynch. Aycock MD (Referring MD) Medicines:             Monitored Anesthesia Care Complications:         No immediate complications. Procedure:             Pre-Anesthesia Assessment:                        - Prior to the procedure, a History and Physical was                         performed, and patient medications, allergies and                         sensitivities were reviewed. The patient's tolerance                         of previous anesthesia was reviewed.                        - The risks and benefits of the procedure and the                         sedation options and risks were discussed with the                         patient. All questions were answered and informed                         consent was obtained.                        - ASA Grade Assessment: II - A patient with mild                         systemic disease.                        After obtaining informed consent, the colonoscope was                         passed under direct vision. Throughout the procedure,                         the patient's  blood pressure, pulse, and oxygen                         saturations were monitored continuously. The                         Colonoscope was introduced through the anus with the                         intention of advancing to the cecum. The scope was                          advanced to the descending colon before the procedure                         was aborted. Medications were given. The colonoscopy                         was performed without difficulty. The patient                         tolerated the procedure well. The quality of the bowel                         preparation was poor. Findings:      The perianal and digital rectal examinations were normal.      A large amount of semi-liquid stool was found in the rectum, in the       sigmoid colon and in the descending colon, interfering with       visualization. Impression:            - Preparation of the colon was poor.                        - Stool in the rectum, in the sigmoid colon and in the                         descending colon.                        - No specimens collected. Recommendation:        - Discharge patient to home (with escort).                        - Resume previous diet.                        - Continue present medications.                        - Repeat colonoscopy in 2 weeks because the bowel                         preparation was suboptimal. Procedure Code(s):     --- Professional ---                        [phone], 53, Colonoscopy, flexible; diagnostic,  including collection of specimen(s) by brushing or                         washing, when performed (separate procedure) Diagnosis Code(s):     --- Professional ---                        Z86.010, Personal history of colonic polyps CPT copyright 2019 [employer] Association. All rights reserved. The codes documented in this report are preliminary and upon coder review may  be revised to meet current compliance requirements. [name], MD [name] MD, MD 11/06/2020 8:06:58 AM This report has been signed electronically. Number of Addenda: 0 Note Initiated On: 11/06/2020 6:58 AM Total Procedure Duration: 0 hours 1 minute 46 seconds  Estimated Blood Loss:  Estimated blood  loss: none.      [facility]

## 2020-11-06 NOTE — Anesthesia Preprocedure Evaluation (Signed)
Anesthesia Evaluation  Patient identified by MRN, date of birth, ID band Patient awake    Reviewed: Allergy & Precautions, NPO status , Patient's Chart, lab work & pertinent test results  History of Anesthesia Complications Negative for: history of anesthetic complications  Airway Mallampati: III   Neck ROM: Full    Dental   Missing few molars:   Pulmonary sleep apnea ,    Pulmonary exam normal breath sounds clear to auscultation       Cardiovascular hypertension, + Peripheral Vascular Disease (s/p RLE amputation)  Normal cardiovascular exam Rhythm:Regular Rate:Normal     Neuro/Psych  Neuromuscular disease (diabetic neuropathy)    GI/Hepatic negative GI ROS,   Endo/Other  diabetes, Type 2Class 3 obesity  Renal/GU Renal disease (stage III CKD)     Musculoskeletal   Abdominal   Peds  Hematology negative hematology ROS (+)   Anesthesia Other Findings   Reproductive/Obstetrics                             Anesthesia Physical Anesthesia Plan  ASA: 3  Anesthesia Plan: General   Post-op Pain Management:    Induction: Intravenous  PONV Risk Score and Plan: 2 and Propofol infusion, TIVA and Treatment may vary due to age or medical condition  Airway Management Planned: Natural Airway  Additional Equipment:   Intra-op Plan:   Post-operative Plan:   Informed Consent: I have reviewed the patients History and Physical, chart, labs and discussed the procedure including the risks, benefits and alternatives for the proposed anesthesia with the patient or authorized representative who has indicated his/her understanding and acceptance.       Plan Discussed with: CRNA  Anesthesia Plan Comments:         Anesthesia Quick Evaluation

## 2020-11-06 NOTE — Anesthesia Postprocedure Evaluation (Signed)
Anesthesia Post Note  Patient: Trevor [name]  Procedure(s) Performed: COLONOSCOPY WITH PROPOFOL  Patient location during evaluation: PACU Anesthesia Type: General Level of consciousness: awake and alert, oriented and patient cooperative Pain management: pain level controlled Vital Signs Assessment: post-procedure vital signs reviewed and stable Respiratory status: spontaneous breathing, nonlabored ventilation and respiratory function stable Cardiovascular status: blood pressure returned to baseline and stable Postop Assessment: adequate PO intake Anesthetic complications: no   No notable events documented.   Last Vitals:  Vitals:   11/06/20 0829 11/06/20 0839  BP: (!) 173/95 (!) 180/101  Pulse: (!) 58 (!) 57  Resp: 10 12  Temp:    SpO2: 96% 98%    Last Pain:  Vitals:   11/06/20 0839  TempSrc:   PainSc: 0-No pain                 Darrin Nipper

## 2020-11-09 ENCOUNTER — Encounter: Payer: Self-pay | Admitting: Gastroenterology

## 2020-12-24 ENCOUNTER — Encounter: Payer: Medicare Other | Admitting: Physical Medicine & Rehabilitation

## 2020-12-31 ENCOUNTER — Ambulatory Visit: Payer: Medicare Other | Admitting: Podiatry

## 2021-01-07 ENCOUNTER — Ambulatory Visit (INDEPENDENT_AMBULATORY_CARE_PROVIDER_SITE_OTHER): Payer: Medicare Other | Admitting: Podiatry

## 2021-01-07 ENCOUNTER — Other Ambulatory Visit: Payer: Self-pay

## 2021-01-07 DIAGNOSIS — M79675 Pain in left toe(s): Secondary | ICD-10-CM | POA: Diagnosis not present

## 2021-01-07 DIAGNOSIS — B351 Tinea unguium: Secondary | ICD-10-CM

## 2021-01-07 DIAGNOSIS — E1142 Type 2 diabetes mellitus with diabetic polyneuropathy: Secondary | ICD-10-CM | POA: Diagnosis not present

## 2021-01-12 ENCOUNTER — Encounter: Payer: Self-pay | Admitting: Podiatry

## 2021-01-12 NOTE — Progress Notes (Signed)
°  Subjective:  Patient ID: Trevor [name], male    DOB: 1955-09-30,  MRN: [id number]  No chief complaint on file.   66 y.o. male returns for the above complaint.  Patient presents with thickened elongated mycotic toenails of the left lower extremity x5.  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  Patient states that they have been painful when ambulating.  He is not able to debride down himself.  He is a type II diabetic with last A1c of 9.6.  He does not have any secondary complaints today.    Objective:  There were no vitals filed for this visit. Podiatric Exam: Vascular: dorsalis pedis and posterior tibial pulses are palpable left capillary return is immediate. Temperature gradient is WNL. Skin turgor WNL  Sensorium: Normal Semmes Weinstein monofilament test. Normal tactile sensation left Nail Exam: Pt has thick disfigured discolored nails with subungual debris noted bilateral entire nail hallux through fifth toenails Ulcer Exam: There is no evidence of ulcer or pre-ulcerative changes or infection. Orthopedic Exam: Muscle tone and strength are WNL. No limitations in general ROM. No crepitus or effusions noted. HAV   left.  Hammer toes 2-5   left.  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 No infection or ulcers.  Severe dryness noted to both lower extremity plantar  Assessment & Plan:  Patient was evaluated and treated and all questions answered.  Hammertoes second and third -I explained to patient the etiology of hammertoe measurement options were discussed.  I educated him on taking the pressure off of the hammertoe by getting wider shoes and making shoe gear modification.  Toe protectors were also dispensed to take the pressure off of the hammertoe.  Ultimately patient is not an ideal surgical candidate for hammertoe correction. -Toe protectors were dispensed  Xerosis/fissuring plantar foot -I explained to the  patient the etiology of xerosis and various treatment options were extensively discussed.  I explained to the patient the importance of maintaining moisturization of the skin with application of over-the-counter lotion such as Eucerin or Luciderm.  Given that patient has failed over-the-counter therapy I believe patient will benefit from ammonium lactate cream.  Ammonium lactate was sent to the pharmacy.   Swelling to the left lower extremity -Improving.  Onychomycosis with pain x5 -Nails palliatively debrided as below. -Educated on self-care -Penlac was also dispensed to help with topical application decrease the fungal growth.  I have asked him to apply twice a day for 6 months.  Patient states understanding  Procedure: Nail Debridement Rationale: pain  Type of Debridement: manual, sharp debridement. Instrumentation: Nail nipper, rotary burr. Number of Nails: 5   Procedures and Treatment: Consent by patient was obtained for treatment procedures. The patient understood the discussion of treatment and procedures well. All questions were answered thoroughly reviewed. Debridement of mycotic and hypertrophic toenails, 1 through 5 bilateral and clearing of subungual debris. No ulceration, no infection noted.  Return Visit-Office Procedure: Patient instructed to return to the office for a follow up visit 3 months for continued evaluation and treatment.  Boneta Lucks, DPM    No follow-ups on file.

## 2021-01-26 ENCOUNTER — Encounter: Payer: Medicare Other | Attending: Physical Medicine & Rehabilitation | Admitting: Physical Medicine and Rehabilitation

## 2021-01-26 ENCOUNTER — Other Ambulatory Visit: Payer: Self-pay

## 2021-01-26 ENCOUNTER — Encounter: Payer: Self-pay | Admitting: Physical Medicine and Rehabilitation

## 2021-01-26 VITALS — BP 140/77 | HR 64 | Ht 76.0 in | Wt 330.0 lb

## 2021-01-26 DIAGNOSIS — M5440 Lumbago with sciatica, unspecified side: Secondary | ICD-10-CM | POA: Diagnosis present

## 2021-01-26 DIAGNOSIS — S50311A Abrasion of right elbow, initial encounter: Secondary | ICD-10-CM | POA: Diagnosis present

## 2021-01-26 DIAGNOSIS — G8929 Other chronic pain: Secondary | ICD-10-CM | POA: Diagnosis present

## 2021-01-26 DIAGNOSIS — M5441 Lumbago with sciatica, right side: Secondary | ICD-10-CM

## 2021-01-26 MED ORDER — DICLOFENAC SODIUM 1 % EX GEL: 2.0000 g | Freq: Four times a day (QID) | CUTANEOUS | 1 refills | Status: AC

## 2021-01-26 MED ORDER — TRAMADOL HCL 50 MG PO TABS: 25.0000 mg | ORAL_TABLET | Freq: Three times a day (TID) | ORAL | 0 refills | Status: DC | PRN

## 2021-01-26 MED ORDER — TRAMADOL HCL 50 MG PO TABS: 25.0000 mg | ORAL_TABLET | Freq: Three times a day (TID) | ORAL | 0 refills | Status: AC | PRN

## 2021-01-26 NOTE — Patient Instructions (Signed)

## 2021-01-26 NOTE — Progress Notes (Signed)
Subjective:    Patient ID: Trevor [name], male    DOB: 06/11/1955, 67 y.o.   MRN: [id number]  HPI  Right-handed male with history of diabetes mellitus, hypertension, legally blind, and CKD presents for follow-up of right BKA and low back pain.   1) Low back pain Currently has pain in his lower back It hurts mostly when he is walking He gets winded standing and walking He finds the Tramadol helpful He needs a refill of the Tramadol today -he has never had Xrs.   2) Right elbow pain -fell on concrete. -Xrs ordered at [facility].    Last clinic visit 05/30/19.  Since that time, pt states he completed therapies. He is wearing his new prosthesis without issue. Denies falls, uses cane for safety. He complains of elbow pain and was evaluated.    Pain Inventory Average Pain 8 Pain Right Now 2 My pain is intermittent, sharp, burning, dull, and tingling  In the last 24 hours, has pain interfered with the following? General activity 6 Relation with others 0 Enjoyment of life 0 What TIME of day is your pain at its worst?  varies Sleep (in general) Good  Pain is worse with: walking and standing Pain improves with: rest and medication Relief from Meds: 10  Mobility walk with assistance use a cane ability to climb steps?  yes do you drive?  yes  Function disabled: date disabled . retired I need assistance with the following:  bathing and household duties  Neuro/Psych trouble walking  Physicians involved in your care Any changes since last visit?  no   No family history on file. Social History   Socioeconomic History   Marital status: Single    Spouse name: Not on file   Number of children: Not on file   Years of education: Not on file   Highest education level: Not on file  Occupational History   Not on file  Tobacco Use   Smoking status: Never   Smokeless tobacco: Never  Vaping Use   Vaping Use: Never used  Substance and Sexual Activity   Alcohol use: No    Drug use: Never   Sexual activity: Not on file  Other Topics Concern   Not on file  Social History Narrative   Not on file   Social Determinants of Health   Financial Resource Strain: Not on file  Food Insecurity: Not on file  Transportation Needs: Not on file  Physical Activity: Not on file  Stress: Not on file  Social Connections: Not on file   Past Surgical History:  Procedure Laterality Date   AMPUTATION Right 03/31/2017   Procedure: RIGHT FOOT 1ST AND 2ND RAY AMPUTATION;  Surgeon: Newt Minion, MD;  Location: [place];  Service: Orthopedics;  Laterality: Right;   AMPUTATION Right 04/01/2017   Procedure: AMPUTATION BELOW KNEE;  Surgeon: Newt Minion, MD;  Location: [place];  Service: Orthopedics;  Laterality: Right;   COLONOSCOPY WITH PROPOFOL N/A 10/02/2017   Procedure: COLONOSCOPY WITH PROPOFOL;  Surgeon: [name], MD;  Location: [facility] ENDOSCOPY;  Service: Gastroenterology;  Laterality: N/A;   COLONOSCOPY WITH PROPOFOL N/A 11/06/2020   Procedure: COLONOSCOPY WITH PROPOFOL;  Surgeon: [name], MD;  Location: [facility] ENDOSCOPY;  Service: Gastroenterology;  Laterality: N/A;   CORNEAL TRANSPLANT     EYE SURGERY     Past Medical History:  Diagnosis Date   Complication of anesthesia    COVID-29 Dec 2018   Diabetes mellitus without  complication (HCC)    Heart murmur    Hyperlipidemia    Hypertension    Obesity (BMI 30-39.9) 06/22/2020   Sleep apnea    There were no vitals taken for this visit.  Opioid Risk Score:   Fall Risk Score:  `1  Depression screen PHQ 2/9  No flowsheet data found.  Review of Systems  Constitutional: Negative.   HENT: Negative.    Eyes: Negative.   Respiratory: Negative.    Cardiovascular: Negative.   Gastrointestinal: Negative.   Endocrine: Negative.        High blood sugar  Genitourinary: Negative.   Musculoskeletal:  Positive for arthralgias, back pain and gait problem.  Skin: Negative.   Allergic/Immunologic: Negative.    Neurological:        Tingling   Hematological: Negative.   Psychiatric/Behavioral: Negative.    All other systems reviewed and are negative.    Objective:   Physical Exam  Constitutional: No distress . Vital signs reviewed. HENT: Normocephalic.  Atraumatic. Eyes: EOMI. No discharge. Cardiovascular: No JVD.   Respiratory: Normal effort.  No stridor.   GI: Non-distended.   Skin: Skin tear to elbow.  Intact. BKA healed. Psych: Normal mood.  Normal behavior. Musc: No edema in extremities.  No tenderness in extremities. Neuro: Alert Motor:  LLE: 5/5 proximal to distal     Assessment & Plan:  Right-handed male with history of diabetes mellitus, hypertension, legally blind, and CKD presents for follow up for right BKA.     1.  Right transtibial amputation              Continue HEP             Released by Ortho             New prosthesis obtained ~06/2018, good function after prosthesis   2. Gait abnormality             Continue HEP             Cont cane for safety  3. B/l elbow pain  Refilled Volaren gel  4.   Patient would like to RTC in 1 year

## 2021-01-26 NOTE — Addendum Note (Signed)
Addended by: Izora Ribas on: 01/26/2021 03:47 PM   Modules accepted: Orders

## 2021-02-10 ENCOUNTER — Ambulatory Visit
Admission: RE | Admit: 2021-02-10 | Discharge: 2021-02-10 | Disposition: A | Discharge status: Home / Self Care | Payer: Medicare Other | Attending: Family Medicine | Admitting: Family Medicine

## 2021-02-10 ENCOUNTER — Ambulatory Visit
Admission: RE | Admit: 2021-02-10 | Discharge: 2021-02-10 | Disposition: A | Discharge status: Home / Self Care | Payer: Medicare Other | Source: Ambulatory Visit | Attending: Family Medicine | Admitting: Family Medicine

## 2021-02-10 ENCOUNTER — Ambulatory Visit
Admission: RE | Admit: 2021-02-10 | Discharge: 2021-02-10 | Disposition: A | Discharge status: Home / Self Care | Payer: Medicare Other | Attending: Physical Medicine and Rehabilitation | Admitting: Physical Medicine and Rehabilitation

## 2021-02-10 ENCOUNTER — Other Ambulatory Visit: Payer: Self-pay | Admitting: Family Medicine

## 2021-02-10 DIAGNOSIS — M79644 Pain in right finger(s): Secondary | ICD-10-CM

## 2021-02-10 DIAGNOSIS — M79645 Pain in left finger(s): Secondary | ICD-10-CM

## 2021-02-10 DIAGNOSIS — G8929 Other chronic pain: Secondary | ICD-10-CM

## 2021-02-10 DIAGNOSIS — X58XXXA Exposure to other specified factors, initial encounter: Secondary | ICD-10-CM | POA: Diagnosis not present

## 2021-02-10 DIAGNOSIS — W19XXXA Unspecified fall, initial encounter: Secondary | ICD-10-CM

## 2021-02-10 DIAGNOSIS — M5440 Lumbago with sciatica, unspecified side: Secondary | ICD-10-CM

## 2021-02-10 DIAGNOSIS — M5441 Lumbago with sciatica, right side: Secondary | ICD-10-CM

## 2021-02-18 ENCOUNTER — Encounter: Payer: Medicare Other | Attending: Physical Medicine & Rehabilitation | Admitting: Physical Medicine and Rehabilitation

## 2021-02-18 ENCOUNTER — Other Ambulatory Visit: Payer: Self-pay

## 2021-02-18 DIAGNOSIS — G8929 Other chronic pain: Secondary | ICD-10-CM | POA: Insufficient documentation

## 2021-02-18 DIAGNOSIS — M5440 Lumbago with sciatica, unspecified side: Secondary | ICD-10-CM | POA: Insufficient documentation

## 2021-02-18 NOTE — Progress Notes (Signed)
Left message with XR results

## 2021-03-01 NOTE — Progress Notes (Signed)
[facility] [place], [place] [address]  Pulmonary Sleep Medicine   Office Visit Note  Patient Name: Trevor [name] DOB: Oct 05, 1955 MRN [id number]    Chief Complaint: Obstructive Sleep Apnea visit  Brief History:  Jamarion is seen today for a follow up visit for CPAP@ 10 cmH2O. The patient has a 14 year history of sleep apnea. Patient is using PAP nightly.  The patient feels rested after sleeping with PAP.  The patient reports benefiting from PAP use. Reported sleepiness is improved and the Epworth Sleepiness Score is 6 out of 24. The patient does not take naps. The patient complains of the following: None.  The compliance download shows 77% compliance with an average use time of 6 hours 39 minutes. The AHI is 14.2.  The patient does not complain of limb movements disrupting sleep. Patient does have BKA on right which limits exercise but is working on diet.  ROS  General: (-) fever, (-) chills, (-) night sweat Nose and Sinuses: (-) nasal stuffiness or itchiness, (-) postnasal drip, (-) nosebleeds, (-) sinus trouble. Mouth and Throat: (-) sore throat, (-) hoarseness. Neck: (-) swollen glands, (-) enlarged thyroid, (-) neck pain. Respiratory: + cough, - shortness of breath, - wheezing. Neurologic: - numbness, - tingling. Psychiatric: - anxiety, - depression   Current Medication: Outpatient Encounter Medications as of 03/02/2021  Medication Sig Note   ACCU-CHEK AVIVA PLUS test strip USE AS DIRECTED TO CHECK BLOOD SUGAR THREE TIMES A DAY    acetaminophen (TYLENOL) 325 MG tablet Take 2 tablets (650 mg total) by mouth every 6 (six) hours as needed for mild pain (or Fever >/= 101).    albuterol (VENTOLIN HFA) 108 (90 Base) MCG/ACT inhaler Inhale 2 puffs into the lungs every 6 (six) hours as needed for wheezing or shortness of breath.    ammonium lactate (AMLACTIN) 12 % lotion Apply 1 application topically as needed for dry skin.    ammonium lactate (LAC-HYDRIN)  12 % cream Apply topically as needed for dry skin.    aspirin EC 81 MG tablet Take 81 mg by mouth daily.    atenolol (TENORMIN) 100 MG tablet Take 100 mg by mouth daily.     atorvastatin (LIPITOR) 40 MG tablet Take 40 mg by mouth daily.    BD INSULIN SYRINGE U/F 31G X 5/16" 1 ML MISC     ciclopirox (PENLAC) 8 % solution Apply topically at bedtime. Apply over nail and surrounding skin. Apply daily over previous coat. After seven (7) days, may remove with alcohol and continue cycle.    clobetasol ointment (TEMOVATE) 2.69 % Apply 1 application topically 2 (two) times daily.    clotrimazole (LOTRIMIN) 1 % cream Apply topically.    cyclobenzaprine (FLEXERIL) 5 MG tablet Take 5 mg by mouth at bedtime as needed.    diclofenac Sodium (VOLTAREN) 1 % GEL Apply 2 g topically 4 (four) times daily.    fluticasone (FLONASE) 50 MCG/ACT nasal spray Place 1 spray into both nostrils daily.    glipiZIDE (GLUCOTROL) 10 MG tablet Take 10 mg by mouth 2 (two) times daily before a meal.    insulin aspart (NOVOLOG) 100 UNIT/ML FlexPen Inject 10 Units into the skin 3 (three) times daily with meals. (Patient not taking: Reported on 01/26/2021) 05/03/2018: As needed   insulin detemir (LEVEMIR) 100 unit/ml SOLN Inject 0.34 mLs (34 Units total) into the skin at bedtime.    insulin lispro (HUMALOG) 100 UNIT/ML injection     JARDIANCE 10  MG TABS tablet Take 10 mg by mouth daily. (Patient not taking: Reported on 01/26/2021)    lactulose (CHRONULAC) 10 GM/15ML solution Take 30 mLs (20 g total) by mouth daily as needed for mild constipation.    LEVEMIR 100 UNIT/ML injection Inject into the skin.    losartan (COZAAR) 100 MG tablet Take 100 mg by mouth daily.    losartan (COZAAR) 25 MG tablet Take by mouth.    metFORMIN (GLUCOPHAGE) 1000 MG tablet Take 1,000 mg by mouth 2 (two) times daily.    mupirocin ointment (BACTROBAN) 2 % SMARTSIG:1 Application Topical 2-3 Times Daily    NIFEdipine (PROCARDIA XL/NIFEDICAL XL) 60 MG 24 hr tablet      NIFEdipine (PROCARDIA-XL/NIFEDICAL-XL) 30 MG 24 hr tablet Take 120 mg by mouth daily.    OneTouch Delica Lancets 71I MISC USE 1 LANCET AS DIRECTED THREE TIMES DAILY    polyethylene glycol (MIRALAX / GLYCOLAX) packet Take 17 g by mouth daily as needed for mild constipation.    polyethylene glycol-electrolytes (NULYTELY) 420 g solution Prepare according to package instructions. Starting at 5:00 PM: Drink one 8 oz glass of mixture every 15 minutes until you finish half of the jug. Five hours prior to procedure, drink 8 oz glass of mixture every 15 minutes until it is all gone. Make sure you do not drink anything 4 hours prior to your procedure.    potassium chloride 20 MEQ TBCR Take 40 mEq by mouth 2 (two) times daily for 3 days.    potassium chloride SA (KLOR-CON M20) 20 MEQ tablet Take 1 tablet (20 mEq total) by mouth daily. (Patient not taking: Reported on 01/26/2021)    spironolactone (ALDACTONE) 25 MG tablet Take 25 mg by mouth daily.    traMADol (ULTRAM) 50 MG tablet Take 0.5-1 tablets (25-50 mg total) by mouth every 8 (eight) hours as needed for severe pain.    No facility-administered encounter medications on file as of 03/02/2021.    Surgical History: Past Surgical History:  Procedure Laterality Date   AMPUTATION Right 03/31/2017   Procedure: RIGHT FOOT 1ST AND 2ND RAY AMPUTATION;  Surgeon: Newt Minion, MD;  Location: [place];  Service: Orthopedics;  Laterality: Right;   AMPUTATION Right 04/01/2017   Procedure: AMPUTATION BELOW KNEE;  Surgeon: Newt Minion, MD;  Location: [place];  Service: Orthopedics;  Laterality: Right;   COLONOSCOPY WITH PROPOFOL N/A 10/02/2017   Procedure: COLONOSCOPY WITH PROPOFOL;  Surgeon: [name], MD;  Location: [facility] ENDOSCOPY;  Service: Gastroenterology;  Laterality: N/A;   COLONOSCOPY WITH PROPOFOL N/A 11/06/2020   Procedure: COLONOSCOPY WITH PROPOFOL;  Surgeon: [name], MD;  Location: [facility] ENDOSCOPY;  Service: Gastroenterology;  Laterality: N/A;    CORNEAL TRANSPLANT     EYE SURGERY      Medical History: Past Medical History:  Diagnosis Date   Complication of anesthesia    COVID-29 Dec 2018   Diabetes mellitus without complication ([place])    Heart murmur    Hyperlipidemia    Hypertension    Obesity (BMI 30-39.9) 06/22/2020   Sleep apnea     Family History: Non contributory to the present illness  Social History: Social History   Socioeconomic History   Marital status: Single    Spouse name: Not on file   Number of children: Not on file   Years of education: Not on file   Highest education level: Not on file  Occupational History   Not on file  Tobacco Use  Smoking status: Never   Smokeless tobacco: Never  Vaping Use   Vaping Use: Never used  Substance and Sexual Activity   Alcohol use: No   Drug use: Never   Sexual activity: Not on file  Other Topics Concern   Not on file  Social History Narrative   Not on file   Social Determinants of Health   Financial Resource Strain: Not on file  Food Insecurity: Not on file  Transportation Needs: Not on file  Physical Activity: Not on file  Stress: Not on file  Social Connections: Not on file  Intimate Partner Violence: Not on file    Vital Signs: There were no vitals taken for this visit. There is no height or weight on file to calculate BMI.    Examination: General Appearance: The patient is well-developed, well-nourished, and in no distress. Neck Circumference: 47 cm Skin: Gross inspection of skin unremarkable. Head: normocephalic, no gross deformities. Eyes: no gross deformities noted. ENT: ears appear grossly normal Neurologic: Alert and oriented. No involuntary movements.    EPWORTH SLEEPINESS SCALE:  Scale:  (0)= no chance of dozing; (1)= slight chance of dozing; (2)= moderate chance of dozing; (3)= high chance of dozing  Chance  Situtation    Sitting and reading: 2    Watching TV: 0    Sitting Inactive in public: 0    As a  passenger in car: 0      Lying down to rest: 3    Sitting and talking: 0    Sitting quielty after lunch: 1    In a car, stopped in traffic: 0   TOTAL SCORE:   6 out of 24    SLEEP STUDIES:  PSG (11/2007) AHI 77/hr, min SpO2 85% Titration (12/2007) CPAP@ 12 cmH2O, CFlex 3 Titration (07/2020) CPAP@ 10 cmH2O   CPAP COMPLIANCE DATA:  Date Range: 01/27/2021-02/25/2021  Average Daily Use: 6 hours 39 minutes  Median Use: 6 hours 42 minutes  Compliance for > 4 Hours: 77%  AHI: 14.2 respiratory events per hour  Days Used: 27/30 days  Mask Leak: 43.9  95th Percentile Pressure: 10         LABS: No results found for this or any previous visit (from the past 2160 hour(s)).  Radiology: DG Lumbar Spine 2-3 Views  Result Date: 02/12/2021 CLINICAL DATA:  Low back pain EXAM: LUMBAR SPINE - 2-3 VIEW COMPARISON:  05/31/2004 FINDINGS: No recent fracture is seen. Degenerative changes are noted with disc space narrowing, bony spurs and facet hypertrophy. If it is clinically necessary to evaluate the spinal contents, follow-up MRI may be considered. There is interval worsening of degenerative changes. Alignment of posterior margins of vertebral bodies is unremarkable. IMPRESSION: No recent fracture is seen in the lumbar spine. Degenerative changes are noted in the lumbar spine and lower thoracic spine with interval worsening. Electronically Signed   By: Elmer Picker M.D.   On: 02/12/2021 14:21   DG ELBOW COMPLETE RIGHT (3+VIEW)  Result Date: 02/11/2021 CLINICAL DATA:  Fall.  Right elbow pain. EXAM: RIGHT ELBOW - COMPLETE 3+ VIEW COMPARISON:  None. FINDINGS: There is moderate medial elbow joint space narrowing and peripheral degenerative spurring at the coronoid process and trochlea. Moderate tip of the coronoid process degenerative spurring on lateral view. No definite joint effusion. Mild radial head-neck junction degenerative spurring. No acute fracture or dislocation.  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 Electronically Signed   By: Yvonne Kendall M.D.   On: 02/11/2021 19:22  DG Hand Complete Left  Result Date: 02/11/2021 CLINICAL DATA:  Bilateral hand pain. EXAM: RIGHT HAND - COMPLETE 3+ VIEW; LEFT HAND - COMPLETE 3+ VIEW COMPARISON:  None. FINDINGS: Right hand: Moderate thumb carpometacarpal joint space narrowing, subchondral sclerosis and peripheral osteophytosis degenerative changes. Mild interphalangeal joint space narrowing diffusely. There is a 4 mm lucent cyst versus erosion within the plantar lateral aspect of the distal head of the proximal phalanx of the index finger with surrounding mild soft tissue swelling. Mild triscaphe joint space narrowing and subchondral sclerosis degenerative change. No acute fracture or dislocation. Left hand: Mild-to-moderate thumb carpometacarpal joint space narrowing and subchondral cystic change degenerative change. Mild joint space narrowing of the second through fifth digit DIP joints. Mild triscaphe joint space narrowing and degenerative cortical irregularity. No acute fracture or dislocation. IMPRESSION: Bilateral hand arthritis in a distribution suggesting osteoarthritis, greatest at the bilateral thumb carpometacarpal joints. There is a peripheral erosion at the right index finger which raises the question of an inflammatory arthropathy such as gout. Recommend clinical correlation. Electronically Signed   By: Yvonne Kendall M.D.   On: 02/11/2021 18:59   DG Hand Complete Right  Result Date: 02/11/2021 CLINICAL DATA:  Bilateral hand pain. EXAM: RIGHT HAND - COMPLETE 3+ VIEW; LEFT HAND - COMPLETE 3+ VIEW COMPARISON:  None. FINDINGS: Right hand: Moderate thumb carpometacarpal joint space narrowing, subchondral sclerosis and peripheral osteophytosis degenerative changes. Mild interphalangeal joint space narrowing diffusely. There is a 4 mm lucent cyst versus erosion within the plantar lateral aspect of  the distal head of the proximal phalanx of the index finger with surrounding mild soft tissue swelling. Mild triscaphe joint space narrowing and subchondral sclerosis degenerative change. No acute fracture or dislocation. Left hand: Mild-to-moderate thumb carpometacarpal joint space narrowing and subchondral cystic change degenerative change. Mild joint space narrowing of the second through fifth digit DIP joints. Mild triscaphe joint space narrowing and degenerative cortical irregularity. No acute fracture or dislocation. IMPRESSION: Bilateral hand arthritis in a distribution suggesting osteoarthritis, greatest at the bilateral thumb carpometacarpal joints. There is a peripheral erosion at the right index finger which raises the question of an inflammatory arthropathy such as gout. Recommend clinical correlation. Electronically Signed   By: Yvonne Kendall M.D.   On: 02/11/2021 18:59    No results found.  DG Lumbar Spine 2-3 Views  Result Date: 02/12/2021 CLINICAL DATA:  Low back pain EXAM: LUMBAR SPINE - 2-3 VIEW COMPARISON:  05/31/2004 FINDINGS: No recent fracture is seen. Degenerative changes are noted with disc space narrowing, bony spurs and facet hypertrophy. If it is clinically necessary to evaluate the spinal contents, follow-up MRI may be considered. There is interval worsening of degenerative changes. Alignment of posterior margins of vertebral bodies is unremarkable. IMPRESSION: No recent fracture is seen in the lumbar spine. Degenerative changes are noted in the lumbar spine and lower thoracic spine with interval worsening. Electronically Signed   By: Elmer Picker M.D.   On: 02/12/2021 14:21   DG ELBOW COMPLETE RIGHT (3+VIEW)  Result Date: 02/11/2021 CLINICAL DATA:  Fall.  Right elbow pain. EXAM: RIGHT ELBOW - COMPLETE 3+ VIEW COMPARISON:  None. FINDINGS: There is moderate medial elbow joint space narrowing and peripheral degenerative spurring at the coronoid process and trochlea. Moderate  tip of the coronoid process degenerative spurring on lateral view. No definite joint effusion. Mild radial head-neck junction degenerative spurring. No acute fracture or dislocation. 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 Electronically Signed   By: Yvonne Kendall  M.D.   On: 02/11/2021 19:22   DG Hand Complete Left  Result Date: 02/11/2021 CLINICAL DATA:  Bilateral hand pain. EXAM: RIGHT HAND - COMPLETE 3+ VIEW; LEFT HAND - COMPLETE 3+ VIEW COMPARISON:  None. FINDINGS: Right hand: Moderate thumb carpometacarpal joint space narrowing, subchondral sclerosis and peripheral osteophytosis degenerative changes. Mild interphalangeal joint space narrowing diffusely. There is a 4 mm lucent cyst versus erosion within the plantar lateral aspect of the distal head of the proximal phalanx of the index finger with surrounding mild soft tissue swelling. Mild triscaphe joint space narrowing and subchondral sclerosis degenerative change. No acute fracture or dislocation. Left hand: Mild-to-moderate thumb carpometacarpal joint space narrowing and subchondral cystic change degenerative change. Mild joint space narrowing of the second through fifth digit DIP joints. Mild triscaphe joint space narrowing and degenerative cortical irregularity. No acute fracture or dislocation. IMPRESSION: Bilateral hand arthritis in a distribution suggesting osteoarthritis, greatest at the bilateral thumb carpometacarpal joints. There is a peripheral erosion at the right index finger which raises the question of an inflammatory arthropathy such as gout. Recommend clinical correlation. Electronically Signed   By: Yvonne Kendall M.D.   On: 02/11/2021 18:59   DG Hand Complete Right  Result Date: 02/11/2021 CLINICAL DATA:  Bilateral hand pain. EXAM: RIGHT HAND - COMPLETE 3+ VIEW; LEFT HAND - COMPLETE 3+ VIEW COMPARISON:  None. FINDINGS: Right hand: Moderate thumb carpometacarpal joint space narrowing, subchondral sclerosis and  peripheral osteophytosis degenerative changes. Mild interphalangeal joint space narrowing diffusely. There is a 4 mm lucent cyst versus erosion within the plantar lateral aspect of the distal head of the proximal phalanx of the index finger with surrounding mild soft tissue swelling. Mild triscaphe joint space narrowing and subchondral sclerosis degenerative change. No acute fracture or dislocation. Left hand: Mild-to-moderate thumb carpometacarpal joint space narrowing and subchondral cystic change degenerative change. Mild joint space narrowing of the second through fifth digit DIP joints. Mild triscaphe joint space narrowing and degenerative cortical irregularity. No acute fracture or dislocation. IMPRESSION: Bilateral hand arthritis in a distribution suggesting osteoarthritis, greatest at the bilateral thumb carpometacarpal joints. There is a peripheral erosion at the right index finger which raises the question of an inflammatory arthropathy such as gout. Recommend clinical correlation. Electronically Signed   By: Yvonne Kendall M.D.   On: 02/11/2021 18:59      Assessment and Plan: Patient Active Problem List   Diagnosis Date Noted   OSA (obstructive sleep apnea) 06/22/2020   Obesity (BMI 30-39.9) 06/22/2020   Pain of both elbows 12/23/2019   Elevated lactic acid level    Diabetic ketoacidosis without coma associated with type 2 diabetes mellitus ([place])    Cellulitis of left foot 12/17/2018   Pain due to onychomycosis of toenail of right foot 07/05/2018   Abnormality of gait 10/12/2017   Type 2 diabetes mellitus with hyperglycemia, with long-term current use of insulin (HCC)    Flatulence    Hypomagnesemia    Unilateral complete BKA, right, sequela (HCC)    Benign essential HTN    Hypoalbuminemia due to protein-calorie malnutrition ([place])    S/P below knee amputation, right ([place]) 04/12/2017   Acute blood loss anemia    Other encephalopathy 04/08/2017   Encephalopathy 04/08/2017   Altered  mental status 04/08/2017   Labile blood pressure    Labile blood glucose    Drug induced constipation    Stage 3 chronic kidney disease ([place])    Bacteremia    S/P unilateral BKA (below knee amputation), right (  [place]) 04/04/2017   PAD (peripheral artery disease) ([place])    Type 2 diabetes mellitus with right diabetic foot ulcer (HCC)    Post-operative pain    Legally blind    Upper GI bleed    Streptococcal bacteremia 04/01/2017   Hypokalemia 03/30/2017   Uncontrolled type 2 diabetes mellitus with hyperglycemia, with long-term current use of insulin ([place]) 03/30/2017   Type 2 diabetes mellitus with peripheral neuropathy ([place]) 03/30/2017   AKI (acute kidney injury) ([place]) 03/30/2017   CKD stage 3 due to type 2 diabetes mellitus ([place]) 03/30/2017   Sepsis ([place]) 03/30/2017   Heart murmur 11/22/2016   Hyperlipidemia associated with type 2 diabetes mellitus ([place]) 11/22/2016   Obstructive sleep apnea syndrome 11/22/2016   Essential hypertension 12/17/2012      The patient does tolerate PAP and reports benefit from PAP use. The patient was reminded how to adjust mask fit and advised to change supplies regularly. The patient was also counselled on nightly use. The compliance is fair. The AHI is 14.2 with high leak. Will work on improving mask fit and schedule mask fit appt if needed. Will adjust to APAP 5-20cm H2O to see if this helps improve AHI.   1. OSA (obstructive sleep apnea) Continue nightly use  2. CPAP use counseling CPAP couseling-Discussed importance of adequate CPAP use as well as proper care and cleaning techniques of machine and all supplies.  3. Essential hypertension Continue current medication and f/u with PCP.    General Counseling: I have discussed the findings of the evaluation and examination with Janeann Forehand.  I have also discussed any further diagnostic evaluation thatmay be needed or ordered today. Justis verbalizes understanding of the findings of todays visit. We also  reviewed his medications today and discussed drug interactions and side effects including but not limited excessive drowsiness and altered mental states. We also discussed that there is always a risk not just to him but also people around him. he has been encouraged to call the office with any questions or concerns that should arise related to todays visit.  No orders of the defined types were placed in this encounter.       I have personally obtained a history, examined the patient, evaluated laboratory and imaging results, formulated the assessment and plan and placed orders.  This patient was seen by Drema Dallas, PA-C in collaboration with Dr. Devona Konig as a part of collaborative care agreement.  Allyne Gee, MD [facility] Diplomate ABMS Pulmonary Critical Care Medicine and Sleep Medicine

## 2021-03-02 ENCOUNTER — Ambulatory Visit (INDEPENDENT_AMBULATORY_CARE_PROVIDER_SITE_OTHER): Payer: Medicare Other | Admitting: Internal Medicine

## 2021-03-02 DIAGNOSIS — I10 Essential (primary) hypertension: Secondary | ICD-10-CM | POA: Diagnosis not present

## 2021-03-02 DIAGNOSIS — Z7189 Other specified counseling: Secondary | ICD-10-CM | POA: Diagnosis not present

## 2021-03-02 DIAGNOSIS — G4733 Obstructive sleep apnea (adult) (pediatric): Secondary | ICD-10-CM | POA: Diagnosis not present

## 2021-03-02 NOTE — Patient Instructions (Signed)

## 2021-04-06 ENCOUNTER — Ambulatory Visit (INDEPENDENT_AMBULATORY_CARE_PROVIDER_SITE_OTHER): Payer: Medicare Other

## 2021-04-06 ENCOUNTER — Ambulatory Visit (INDEPENDENT_AMBULATORY_CARE_PROVIDER_SITE_OTHER): Payer: Medicare Other | Admitting: Podiatry

## 2021-04-06 ENCOUNTER — Other Ambulatory Visit: Payer: Self-pay

## 2021-04-06 DIAGNOSIS — R601 Generalized edema: Secondary | ICD-10-CM

## 2021-04-06 DIAGNOSIS — B351 Tinea unguium: Secondary | ICD-10-CM

## 2021-04-06 DIAGNOSIS — M79675 Pain in left toe(s): Secondary | ICD-10-CM

## 2021-04-06 DIAGNOSIS — E1142 Type 2 diabetes mellitus with diabetic polyneuropathy: Secondary | ICD-10-CM | POA: Diagnosis not present

## 2021-04-06 DIAGNOSIS — M79674 Pain in right toe(s): Secondary | ICD-10-CM

## 2021-04-07 NOTE — Progress Notes (Signed)
?Subjective:  ?Patient ID: Trevor [name], male    DOB: 12-13-1955,  MRN: [id number] ? ?Chief Complaint  ?Patient presents with  ? Nail Problem  ?  Nail trim   ? ? ?66 y.o. male returns for the above complaint.  Patient presents with thickened elongated mycotic toenails of the left lower extremity x5.  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  Patient states that they have been painful when ambulating.  He is not able to debride down himself.  He is a type II diabetic with last A1c of 9.6.  He has a secondary complaint of left dorsal foot swelling with pain associated with it.  He would like to get an x-ray just to make sure that there is nothing going on.  He is a diabetic. ? ?Objective:  ?There were no vitals filed for this visit. ?Podiatric Exam: ?Vascular: dorsalis pedis and posterior tibial pulses are palpable left capillary return is immediate. Temperature gradient is WNL. Skin turgor WNL  ?Sensorium: Normal Semmes Weinstein monofilament test. Normal tactile sensation left ?Nail Exam: Pt has thick disfigured discolored nails with subungual debris noted bilateral entire nail hallux through fifth toenails ?Ulcer Exam: There is no evidence of ulcer or pre-ulcerative changes or infection. ?Orthopedic Exam: Muscle tone and strength are WNL. No limitations in general ROM. No crepitus or effusions noted. HAV   left.  Hammer toes 2-5   left.  Right below the knee amputation with prosthesis ?Skin: No Porokeratosis. No infection or ulcers.  Generalized swelling noted to left lower extremity causing him some pain.  Pain on palpation to entire dorsal foot. ? 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  Generalized increase in soft tissue swelling noted to the dorsal foot.  No fractures noted no Charcot deformity noted no complication noted. ? ?Assessment & Plan:  ?Patient was evaluated and treated and all questions  answered. ? ?Hammertoes second and third ?-I explained to patient the etiology of hammertoe measurement options were discussed.  I educated him on taking the pressure off of the hammertoe by getting wider shoes and making shoe gear modification.  Toe protectors were also dispensed to take the pressure off of the hammertoe.  Ultimately patient is not an ideal surgical candidate for hammertoe correction. ?-Toe protectors were dispensed ? ?Xerosis/fissuring plantar foot ?-I explained to the patient the etiology of xerosis and various treatment options were extensively discussed.  I explained to the patient the importance of maintaining moisturization of the skin with application of over-the-counter lotion such as Eucerin or Luciderm.  Given that patient has failed over-the-counter therapy I believe patient will benefit from ammonium lactate cream.  Ammonium lactate was sent to the pharmacy. ? ? ?Swelling to the left lower extremity ?-Given the amount of pain and swelling that he was experienced to the left lower extremity I encouraged him to get an x-ray.  X-ray was obtained which did not show any type of fractures.  The pain that he is experiencing may be due to high swelling.  I encouraged compression socks and elevation.  He states understanding. ? ?Onychomycosis with pain x5 ?-Nails palliatively debrided as below. ?-Educated on self-care ?-Penlac was also dispensed to help with topical application decrease the fungal growth.  I have asked him to apply twice a day for 6 months.  Patient states understanding ? ?Procedure: Nail Debridement ?Rationale: pain  ?Type of Debridement: manual, sharp debridement. ?Instrumentation: Nail nipper,  rotary burr. ?Number of Nails: 5  ? ?Procedures and Treatment: Consent by patient was obtained for treatment procedures. The patient understood the discussion of treatment and procedures well. All questions were answered thoroughly reviewed. Debridement of mycotic and hypertrophic  toenails, 1 through 5 bilateral and clearing of subungual debris. No ulceration, no infection noted.  ?Return Visit-Office Procedure: Patient instructed to return to the office for a follow up visit 3 months for continued evaluation and treatment. ? ?Boneta Lucks, DPM ?  ? ?No follow-ups on file. ? ?

## 2021-04-08 ENCOUNTER — Ambulatory Visit: Payer: Medicare Other | Admitting: Podiatry

## 2021-05-31 ENCOUNTER — Ambulatory Visit: Payer: Medicare Other | Admitting: Internal Medicine

## 2021-05-31 NOTE — Progress Notes (Signed)
Pt did not log in for his scheduled appointment. Attempts were made to reach him by phone and messages were left.

## 2021-07-08 ENCOUNTER — Ambulatory Visit (INDEPENDENT_AMBULATORY_CARE_PROVIDER_SITE_OTHER): Payer: Medicare Other | Admitting: Podiatry

## 2021-07-08 DIAGNOSIS — B351 Tinea unguium: Secondary | ICD-10-CM

## 2021-07-08 DIAGNOSIS — M79675 Pain in left toe(s): Secondary | ICD-10-CM | POA: Diagnosis not present

## 2021-07-08 DIAGNOSIS — E1142 Type 2 diabetes mellitus with diabetic polyneuropathy: Secondary | ICD-10-CM

## 2021-07-15 NOTE — Progress Notes (Signed)
  Subjective:  Patient ID: Trevor [name], male    DOB: 02/20/1955,  MRN: [id number]  Chief Complaint  Patient presents with   Nail Problem    66 y.o. male returns for the above complaint.  Patient presents with thickened elongated mycotic toenails of the left lower extremity x5.  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  Patient states that they have been painful when ambulating.  He is not able to debride down himself.  He is a type II diabetic with last A1c of 9.6.  He does not have any secondary complaints today.    Objective:  There were no vitals filed for this visit. Podiatric Exam: Vascular: dorsalis pedis and posterior tibial pulses are palpable left capillary return is immediate. Temperature gradient is WNL. Skin turgor WNL  Sensorium: Normal Semmes Weinstein monofilament test. Normal tactile sensation left Nail Exam: Pt has thick disfigured discolored nails with subungual debris noted bilateral entire nail hallux through fifth toenails Ulcer Exam: There is no evidence of ulcer or pre-ulcerative changes or infection. Orthopedic Exam: Muscle tone and strength are WNL. No limitations in general ROM. No crepitus or effusions noted. HAV   left.  Hammer toes 2-5   left.  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 No infection or ulcers.  Severe dryness noted to both lower extremity plantar  Assessment & Plan:  Patient was evaluated and treated and all questions answered.  Hammertoes second and third -I explained to patient the etiology of hammertoe measurement options were discussed.  I educated him on taking the pressure off of the hammertoe by getting wider shoes and making shoe gear modification.  Toe protectors were also dispensed to take the pressure off of the hammertoe.  Ultimately patient is not an ideal surgical candidate for hammertoe correction. -Toe protectors were dispensed  Xerosis/fissuring  plantar foot -I explained to the patient the etiology of xerosis and various treatment options were extensively discussed.  I explained to the patient the importance of maintaining moisturization of the skin with application of over-the-counter lotion such as Eucerin or Luciderm.  Given that patient has failed over-the-counter therapy I believe patient will benefit from ammonium lactate cream.  Ammonium lactate was sent to the pharmacy.   Swelling to the left lower extremity -Improving.  Onychomycosis with pain x5 -Nails palliatively debrided as below. -Educated on self-care -Penlac was also dispensed to help with topical application decrease the fungal growth.  I have asked him to apply twice a day for 6 months.  Patient states understanding  Procedure: Nail Debridement Rationale: pain  Type of Debridement: manual, sharp debridement. Instrumentation: Nail nipper, rotary burr. Number of Nails: 5   Procedures and Treatment: Consent by patient was obtained for treatment procedures. The patient understood the discussion of treatment and procedures well. All questions were answered thoroughly reviewed. Debridement of mycotic and hypertrophic toenails, 1 through 5 bilateral and clearing of subungual debris. No ulceration, no infection noted.  Return Visit-Office Procedure: Patient instructed to return to the office for a follow up visit 3 months for continued evaluation and treatment.  Boneta Lucks, DPM    No follow-ups on file.

## 2021-07-26 ENCOUNTER — Ambulatory Visit (INDEPENDENT_AMBULATORY_CARE_PROVIDER_SITE_OTHER): Payer: Medicare Other | Admitting: Internal Medicine

## 2021-07-26 VITALS — BP 142/79 | HR 63 | Resp 16 | Ht 76.0 in | Wt 340.7 lb

## 2021-07-26 DIAGNOSIS — I10 Essential (primary) hypertension: Secondary | ICD-10-CM | POA: Diagnosis not present

## 2021-07-26 DIAGNOSIS — Z7189 Other specified counseling: Secondary | ICD-10-CM | POA: Insufficient documentation

## 2021-07-26 DIAGNOSIS — G4733 Obstructive sleep apnea (adult) (pediatric): Secondary | ICD-10-CM

## 2021-07-26 NOTE — Progress Notes (Unsigned)
[facility] [place], [place] [address]  Pulmonary Sleep Medicine   Office Visit Note  Patient Name: Trevor [name] DOB: Feb 23, 1955 MRN [id number]    Chief Complaint: Obstructive Sleep Apnea visit  Brief History:  Trevor [name] is seen today for a follow up visit for APAP@ 5-20 cmH2O. The patient has a 14 year history of sleep apnea. Patient is using PAP nightly.  The patient feels somewhat rested after sleeping with PAP.  The patient reports benefiting from PAP use. Reported sleepiness is  improved and the Epworth Sleepiness Score is 5 out of 24. The patient continues to require PAP therapy in order to eliminate his sleep apnea. The patient does not take naps. The patient complains of the following: mask leak he can't seem to control.  The compliance download shows 75% compliance with an average use time of 6 hours 16 minutes. The AHI is 10.0.  The patient does not complain of limb movements disrupting sleep.  ROS  General: (-) fever, (-) chills, (-) night sweat Nose and Sinuses: (-) nasal stuffiness or itchiness, (-) postnasal drip, (-) nosebleeds, (-) sinus trouble. Mouth and Throat: (-) sore throat, (-) hoarseness. Neck: (-) swollen glands, (-) enlarged thyroid, (-) neck pain. Respiratory: - cough, - shortness of breath, - wheezing. Neurologic: - numbness, - tingling. Psychiatric: - anxiety, - depression   Current Medication: Outpatient Encounter Medications as of 07/26/2021  Medication Sig Note   ACCU-CHEK AVIVA PLUS test strip USE AS DIRECTED TO CHECK BLOOD SUGAR THREE TIMES A DAY    acetaminophen (TYLENOL) 325 MG tablet Take 2 tablets (650 mg total) by mouth every 6 (six) hours as needed for mild pain (or Fever >/= 101).    albuterol (VENTOLIN HFA) 108 (90 Base) MCG/ACT inhaler Inhale 2 puffs into the lungs every 6 (six) hours as needed for wheezing or shortness of breath.    ammonium lactate (AMLACTIN) 12 % lotion Apply 1 application topically as needed  for dry skin.    ammonium lactate (LAC-HYDRIN) 12 % cream Apply topically as needed for dry skin.    aspirin EC 81 MG tablet Take 81 mg by mouth daily.    atenolol (TENORMIN) 100 MG tablet Take 100 mg by mouth daily.     atorvastatin (LIPITOR) 40 MG tablet Take 40 mg by mouth daily.    BD INSULIN SYRINGE U/F 31G X 5/16" 1 ML MISC     ciclopirox (PENLAC) 8 % solution Apply topically at bedtime. Apply over nail and surrounding skin. Apply daily over previous coat. After seven (7) days, may remove with alcohol and continue cycle.    clobetasol ointment (TEMOVATE) 9.32 % Apply 1 application topically 2 (two) times daily.    clotrimazole (LOTRIMIN) 1 % cream Apply topically.    cyclobenzaprine (FLEXERIL) 5 MG tablet Take 5 mg by mouth at bedtime as needed.    diclofenac Sodium (VOLTAREN) 1 % GEL Apply 2 g topically 4 (four) times daily.    fluticasone (FLONASE) 50 MCG/ACT nasal spray Place 1 spray into both nostrils daily.    glipiZIDE (GLUCOTROL) 10 MG tablet Take 10 mg by mouth 2 (two) times daily before a meal.    insulin aspart (NOVOLOG) 100 UNIT/ML FlexPen Inject 10 Units into the skin 3 (three) times daily with meals. (Patient not taking: Reported on 01/26/2021) 05/03/2018: As needed   insulin detemir (LEVEMIR) 100 unit/ml SOLN Inject 0.34 mLs (34 Units total) into the skin at bedtime.    insulin lispro (HUMALOG) 100  UNIT/ML injection     JARDIANCE 10 MG TABS tablet Take 10 mg by mouth daily. (Patient not taking: Reported on 01/26/2021)    lactulose (CHRONULAC) 10 GM/15ML solution Take 30 mLs (20 g total) by mouth daily as needed for mild constipation.    LEVEMIR 100 UNIT/ML injection Inject into the skin.    losartan (COZAAR) 100 MG tablet Take 100 mg by mouth daily.    losartan (COZAAR) 25 MG tablet Take by mouth.    metFORMIN (GLUCOPHAGE) 1000 MG tablet Take 1,000 mg by mouth 2 (two) times daily.    mupirocin ointment (BACTROBAN) 2 % SMARTSIG:1 Application Topical 2-3 Times Daily     NIFEdipine (PROCARDIA XL/NIFEDICAL XL) 60 MG 24 hr tablet     NIFEdipine (PROCARDIA-XL/NIFEDICAL-XL) 30 MG 24 hr tablet Take 120 mg by mouth daily.    OneTouch Delica Lancets 82U MISC USE 1 LANCET AS DIRECTED THREE TIMES DAILY    polyethylene glycol (MIRALAX / GLYCOLAX) packet Take 17 g by mouth daily as needed for mild constipation.    polyethylene glycol-electrolytes (NULYTELY) 420 g solution Prepare according to package instructions. Starting at 5:00 PM: Drink one 8 oz glass of mixture every 15 minutes until you finish half of the jug. Five hours prior to procedure, drink 8 oz glass of mixture every 15 minutes until it is all gone. Make sure you do not drink anything 4 hours prior to your procedure.    potassium chloride 20 MEQ TBCR Take 40 mEq by mouth 2 (two) times daily for 3 days.    potassium chloride SA (KLOR-CON M20) 20 MEQ tablet Take 1 tablet (20 mEq total) by mouth daily. (Patient not taking: Reported on 01/26/2021)    spironolactone (ALDACTONE) 25 MG tablet Take 25 mg by mouth daily.    traMADol (ULTRAM) 50 MG tablet Take 0.5-1 tablets (25-50 mg total) by mouth every 8 (eight) hours as needed for severe pain.    No facility-administered encounter medications on file as of 07/26/2021.    Surgical History: Past Surgical History:  Procedure Laterality Date   AMPUTATION Right 03/31/2017   Procedure: RIGHT FOOT 1ST AND 2ND RAY AMPUTATION;  Surgeon: Newt Minion, MD;  Location: [facility];  Service: Orthopedics;  Laterality: Right;   AMPUTATION Right 04/01/2017   Procedure: AMPUTATION BELOW KNEE;  Surgeon: Newt Minion, MD;  Location: [facility];  Service: Orthopedics;  Laterality: Right;   COLONOSCOPY WITH PROPOFOL N/A 10/02/2017   Procedure: COLONOSCOPY WITH PROPOFOL;  Surgeon: [name], MD;  Location: [facility] ENDOSCOPY;  Service: Gastroenterology;  Laterality: N/A;   COLONOSCOPY WITH PROPOFOL N/A 11/06/2020   Procedure: COLONOSCOPY WITH PROPOFOL;  Surgeon: [name], MD;  Location: [facility]  ENDOSCOPY;  Service: Gastroenterology;  Laterality: N/A;   CORNEAL TRANSPLANT     EYE SURGERY      Medical History: Past Medical History:  Diagnosis Date   Complication of anesthesia    COVID-29 Dec 2018   Diabetes mellitus without complication ([place])    Heart murmur    Hyperlipidemia    Hypertension    Obesity (BMI 30-39.9) 06/22/2020   Sleep apnea     Family History: Non contributory to the present illness  Social History: Social History   Socioeconomic History   Marital status: Single    Spouse name: Not on file   Number of children: Not on file   Years of education: Not on file   Highest education level: Not on file  Occupational History  Not on file  Tobacco Use   Smoking status: Never   Smokeless tobacco: Never  Vaping Use   Vaping Use: Never used  Substance and Sexual Activity   Alcohol use: No   Drug use: Never   Sexual activity: Not on file  Other Topics Concern   Not on file  Social History Narrative   Not on file   Social Determinants of Health   Financial Resource Strain: Not on file  Food Insecurity: Not on file  Transportation Needs: Not on file  Physical Activity: Not on file  Stress: Not on file  Social Connections: Not on file  Intimate Partner Violence: Not on file    Vital Signs: Blood pressure (!) 142/79, pulse 63, resp. rate 16, height '6\' 4"'$  (1.93 m), weight (!) 340 lb 11.2 oz (154.5 kg), SpO2 97 %. Body mass index is 41.47 kg/m.    Examination: General Appearance: The patient is well-developed, well-nourished, and in no distress. Neck Circumference: 47 cm Skin: Gross inspection of skin unremarkable. Head: normocephalic, no gross deformities. Eyes: no gross deformities noted. ENT: ears appear grossly normal Neurologic: Alert and oriented. No involuntary movements.    EPWORTH SLEEPINESS SCALE:  Scale:  (0)= no chance of dozing; (1)= slight chance of dozing; (2)= moderate chance of dozing; (3)= high chance of  dozing  Chance  Situtation    Sitting and reading: 2    Watching TV: 0    Sitting Inactive in public: 0    As a passenger in car: 0      Lying down to rest: 3    Sitting and talking: 0    Sitting quielty after lunch: 0    In a car, stopped in traffic: 0   TOTAL SCORE:   5 out of 24    SLEEP STUDIES:  PSG (11/2007) AHI 77/hr, min SpO2 85% Titration (12/2007) CPAP@ 12 cmH2O, CFlex 3 Titration (07/2020) CPAP@ 10 cmH2O   CPAP COMPLIANCE DATA:  Date Range: 05/24/2021-07/22/2021  Average Daily Use: 6 hours 16 minutes  Median Use: 6 hours 12 minutes  Compliance for > 4 Hours: 75%  AHI: 10.0 respiratory events per hour  Days Used: 49/60 days  Mask Leak: 47.9  95th Percentile Pressure: 15.4         LABS: No results found for this or any previous visit (from the past 2160 hour(s)).  Radiology: DG Lumbar Spine 2-3 Views  Result Date: 02/12/2021 CLINICAL DATA:  Low back pain EXAM: LUMBAR SPINE - 2-3 VIEW COMPARISON:  05/31/2004 FINDINGS: No recent fracture is seen. Degenerative changes are noted with disc space narrowing, bony spurs and facet hypertrophy. If it is clinically necessary to evaluate the spinal contents, follow-up MRI may be considered. There is interval worsening of degenerative changes. Alignment of posterior margins of vertebral bodies is unremarkable. IMPRESSION: No recent fracture is seen in the lumbar spine. Degenerative changes are noted in the lumbar spine and lower thoracic spine with interval worsening. Electronically Signed   By: Elmer Picker M.D.   On: 02/12/2021 14:21   DG ELBOW COMPLETE RIGHT (3+VIEW)  Result Date: 02/11/2021 CLINICAL DATA:  Fall.  Right elbow pain. EXAM: RIGHT ELBOW - COMPLETE 3+ VIEW COMPARISON:  None. FINDINGS: There is moderate medial elbow joint space narrowing and peripheral degenerative spurring at the coronoid process and trochlea. Moderate tip of the coronoid process degenerative spurring on lateral view.  No definite joint effusion. Mild radial head-neck junction degenerative spurring. No acute fracture or dislocation. 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 Electronically Signed   By: Yvonne Kendall M.D.   On: 02/11/2021 19:22   DG Hand Complete Right  Result Date: 02/11/2021 CLINICAL DATA:  Bilateral hand pain. EXAM: RIGHT HAND - COMPLETE 3+ VIEW; LEFT HAND - COMPLETE 3+ VIEW COMPARISON:  None. FINDINGS: Right hand: Moderate thumb carpometacarpal joint space narrowing, subchondral sclerosis and peripheral osteophytosis degenerative changes. Mild interphalangeal joint space narrowing diffusely. There is a 4 mm lucent cyst versus erosion within the plantar lateral aspect of the distal head of the proximal phalanx of the index finger with surrounding mild soft tissue swelling. Mild triscaphe joint space narrowing and subchondral sclerosis degenerative change. No acute fracture or dislocation. Left hand: Mild-to-moderate thumb carpometacarpal joint space narrowing and subchondral cystic change degenerative change. Mild joint space narrowing of the second through fifth digit DIP joints. Mild triscaphe joint space narrowing and degenerative cortical irregularity. No acute fracture or dislocation. IMPRESSION: Bilateral hand arthritis in a distribution suggesting osteoarthritis, greatest at the bilateral thumb carpometacarpal joints. There is a peripheral erosion at the right index finger which raises the question of an inflammatory arthropathy such as gout. Recommend clinical correlation. Electronically Signed   By: Yvonne Kendall M.D.   On: 02/11/2021 18:59   DG Hand Complete Left  Result Date: 02/11/2021 CLINICAL DATA:  Bilateral hand pain. EXAM: RIGHT HAND - COMPLETE 3+ VIEW; LEFT HAND - COMPLETE 3+ VIEW COMPARISON:  None. FINDINGS: Right hand: Moderate thumb carpometacarpal joint space narrowing, subchondral sclerosis and peripheral osteophytosis degenerative changes. Mild  interphalangeal joint space narrowing diffusely. There is a 4 mm lucent cyst versus erosion within the plantar lateral aspect of the distal head of the proximal phalanx of the index finger with surrounding mild soft tissue swelling. Mild triscaphe joint space narrowing and subchondral sclerosis degenerative change. No acute fracture or dislocation. Left hand: Mild-to-moderate thumb carpometacarpal joint space narrowing and subchondral cystic change degenerative change. Mild joint space narrowing of the second through fifth digit DIP joints. Mild triscaphe joint space narrowing and degenerative cortical irregularity. No acute fracture or dislocation. IMPRESSION: Bilateral hand arthritis in a distribution suggesting osteoarthritis, greatest at the bilateral thumb carpometacarpal joints. There is a peripheral erosion at the right index finger which raises the question of an inflammatory arthropathy such as gout. Recommend clinical correlation. Electronically Signed   By: Yvonne Kendall M.D.   On: 02/11/2021 18:59    No results found.  No results found.    Assessment and Plan: Patient Active Problem List   Diagnosis Date Noted   OSA (obstructive sleep apnea) 06/22/2020   Obesity (BMI 30-39.9) 06/22/2020   Pain of both elbows 12/23/2019   Elevated lactic acid level    Diabetic ketoacidosis without coma associated with type 2 diabetes mellitus ([place])    Cellulitis of left foot 12/17/2018   Pain due to onychomycosis of toenail of right foot 07/05/2018   Abnormality of gait 10/12/2017   Type 2 diabetes mellitus with hyperglycemia, with long-term current use of insulin (HCC)    Flatulence    Hypomagnesemia    Unilateral complete BKA, right, sequela (HCC)    Benign essential HTN    Hypoalbuminemia due to protein-calorie malnutrition ([place])    S/P below knee amputation, right ([place]) 04/12/2017   Acute blood loss anemia    Other encephalopathy 04/08/2017   Encephalopathy 04/08/2017   Altered mental  status 04/08/2017   Labile blood pressure    Labile blood glucose    Drug induced constipation  Stage 3 chronic kidney disease (HCC)    Bacteremia    S/P unilateral BKA (below knee amputation), right ([place]) 04/04/2017   PAD (peripheral artery disease) ([place])    Type 2 diabetes mellitus with right diabetic foot ulcer ([place])    Post-operative pain    Legally blind    Upper GI bleed    Streptococcal bacteremia 04/01/2017   Hypokalemia 03/30/2017   Uncontrolled type 2 diabetes mellitus with hyperglycemia, with long-term current use of insulin ([place]) 03/30/2017   Type 2 diabetes mellitus with peripheral neuropathy ([place]) 03/30/2017   AKI (acute kidney injury) ([place]) 03/30/2017   CKD stage 3 due to type 2 diabetes mellitus ([place]) 03/30/2017   Sepsis ([place]) 03/30/2017   Heart murmur 11/22/2016   Hyperlipidemia associated with type 2 diabetes mellitus ([place]) 11/22/2016   Obstructive sleep apnea syndrome 11/22/2016   Essential hypertension 12/17/2012    1. OSA (obstructive sleep apnea) The patient does tolerate PAP and reports  benefit from PAP use.His apnea is not controlled and he has a large leak. He will be set up for a titration- it is likely he will require bipap therapy.  The patient was reminded how to clean equipment and advised to replace supplies routinely. Advised the airtouch seal.  The patient was also counselled on weight loss. The compliance is good. The AHI is 10,0. The patient continues to medically require PAP therapy, but it is as of this time unclear of the optimal treatment pressure, and this will be determine by the titration study.   OSA on cpap- not controlled. Cpap/bipap titration study recommended. F/u 30d after set up.   2. CPAP use counseling CPAP Counseling: had a lengthy discussion with the patient regarding the importance of PAP therapy in management of the sleep apnea. Patient appears to understand the risk factor reduction and also understands the risks associated  with untreated sleep apnea. Patient will try to make a good faith effort to remain compliant with therapy. Also instructed the patient on proper cleaning of the device including the water must be changed daily if possible and use of distilled water is preferred. Patient understands that the machine should be regularly cleaned with appropriate recommended cleaning solutions that do not damage the PAP machine for example given white vinegar and water rinses. Other methods such as ozone treatment may not be as good as these simple methods to achieve cleaning.   3. Essential hypertension Hypertension Counseling:   The following hypertensive lifestyle modification were recommended and discussed:  1. Limiting alcohol intake to less than 1 oz/day of ethanol:(24 oz of beer or 8 oz of wine or 2 oz of 100-proof whiskey). 2. Take baby ASA 81 mg daily. 3. Importance of regular aerobic exercise and losing weight. 4. Reduce dietary saturated fat and cholesterol intake for overall cardiovascular health. 5. Maintaining adequate dietary potassium, calcium, and magnesium intake. 6. Regular monitoring of the blood pressure. 7. Reduce sodium intake to less than 100 mmol/day (less than 2.3 gm of sodium or less than 6 gm of sodium choride)    4. Morbid obesity Obesity Counseling: Had a lengthy discussion regarding patients BMI and weight issues. Patient was instructed on portion control as well as increased activity. Also discussed caloric restrictions with trying to maintain intake less than 2000 Kcal. Discussions were made in accordance with the 5As of weight management. Simple actions such as not eating late and if able to, taking a walk is suggested.   General Counseling: I have discussed the findings  of the evaluation and examination with Janeann Forehand.  I have also discussed any further diagnostic evaluation thatmay be needed or ordered today. Cheveyo verbalizes understanding of the findings of todays visit. We also  reviewed his medications today and discussed drug interactions and side effects including but not limited excessive drowsiness and altered mental states. We also discussed that there is always a risk not just to him but also people around him. he has been encouraged to call the office with any questions or concerns that should arise related to todays visit.  No orders of the defined types were placed in this encounter.       I have personally obtained a history, examined the patient, evaluated laboratory and imaging results, formulated the assessment and plan and placed orders. This patient was seen today by Tressie Ellis, PA-C in collaboration with Dr. Devona Konig.   Allyne Gee, MD [facility] ABMS Pulmonary Critical Care Medicine and Sleep Medicine

## 2021-07-26 NOTE — Patient Instructions (Signed)

## 2021-08-31 ENCOUNTER — Ambulatory Visit: Payer: Medicare Other | Admitting: Physical Medicine and Rehabilitation

## 2021-09-06 ENCOUNTER — Ambulatory Visit (INDEPENDENT_AMBULATORY_CARE_PROVIDER_SITE_OTHER): Payer: Medicare Other | Admitting: Internal Medicine

## 2021-09-06 VITALS — BP 179/91 | HR 62 | Resp 16 | Ht 76.0 in | Wt 340.0 lb

## 2021-09-06 DIAGNOSIS — G4731 Primary central sleep apnea: Secondary | ICD-10-CM

## 2021-09-06 DIAGNOSIS — I10 Essential (primary) hypertension: Secondary | ICD-10-CM

## 2021-09-06 DIAGNOSIS — Z7189 Other specified counseling: Secondary | ICD-10-CM

## 2021-09-06 NOTE — Progress Notes (Unsigned)
[facility] [place], [place] [address]  Pulmonary Sleep Medicine   Office Visit Note  Patient Name: Trevor [name] DOB: 03-17-1955 MRN [id number]    Chief Complaint: Obstructive Sleep Apnea visit  Brief History:  Trevor [name] is seen today for a follow up to discuss his recent sleep study results. He is currently using an APAP at 5-20 cmh20. The patient has a 14 year history of sleep apnea. Patient is using PAP nightly.  The patient feels not rested after sleeping with PAP.  The patient reports benefiting some from PAP use. Reported sleepiness is improved and the Epworth Sleepiness Score is 10 out of 24. The patient does take naps, about 3 times per week for an hour or 2 hours. The patient complains of the following: No complaints.  The compliance download shows 83% compliance with an average use time of 5 hours 43 minutes. The AHI is 9.1.  The patient does not complain of limb movements disrupting sleep.  ROS  General: (-) fever, (-) chills, (-) night sweat Nose and Sinuses: (-) nasal stuffiness or itchiness, (-) postnasal drip, (-) nosebleeds, (-) sinus trouble. Mouth and Throat: (-) sore throat, (-) hoarseness. Neck: (-) swollen glands, (-) enlarged thyroid, (-) neck pain. Respiratory: - cough, - shortness of breath, - wheezing. Neurologic: - numbness, - tingling. Psychiatric: - anxiety, - depression   Current Medication: Outpatient Encounter Medications as of 09/06/2021  Medication Sig Note   ACCU-CHEK AVIVA PLUS test strip USE AS DIRECTED TO CHECK BLOOD SUGAR THREE TIMES A DAY    acetaminophen (TYLENOL) 325 MG tablet Take 2 tablets (650 mg total) by mouth every 6 (six) hours as needed for mild pain (or Fever >/= 101).    albuterol (VENTOLIN HFA) 108 (90 Base) MCG/ACT inhaler Inhale 2 puffs into the lungs every 6 (six) hours as needed for wheezing or shortness of breath.    ammonium lactate (AMLACTIN) 12 % lotion Apply 1 application topically as needed  for dry skin.    ammonium lactate (LAC-HYDRIN) 12 % cream Apply topically as needed for dry skin.    aspirin EC 81 MG tablet Take 81 mg by mouth daily.    atenolol (TENORMIN) 100 MG tablet Take 100 mg by mouth daily.     atorvastatin (LIPITOR) 40 MG tablet Take 40 mg by mouth daily.    BD INSULIN SYRINGE U/F 31G X 5/16" 1 ML MISC     ciclopirox (PENLAC) 8 % solution Apply topically at bedtime. Apply over nail and surrounding skin. Apply daily over previous coat. After seven (7) days, may remove with alcohol and continue cycle.    clobetasol ointment (TEMOVATE) 4.09 % Apply 1 application topically 2 (two) times daily.    clotrimazole (LOTRIMIN) 1 % cream Apply topically.    cyclobenzaprine (FLEXERIL) 5 MG tablet Take 5 mg by mouth at bedtime as needed.    diclofenac Sodium (VOLTAREN) 1 % GEL Apply 2 g topically 4 (four) times daily.    fluticasone (FLONASE) 50 MCG/ACT nasal spray Place 1 spray into both nostrils daily.    furosemide (LASIX) 40 MG tablet Take 40 mg by mouth daily.    glipiZIDE (GLUCOTROL) 10 MG tablet Take 10 mg by mouth 2 (two) times daily before a meal.    insulin aspart (NOVOLOG) 100 UNIT/ML FlexPen Inject 10 Units into the skin 3 (three) times daily with meals. (Patient not taking: Reported on 01/26/2021) 05/03/2018: As needed   insulin detemir (LEVEMIR) 100 unit/ml SOLN Inject 0.34  mLs (34 Units total) into the skin at bedtime.    insulin lispro (HUMALOG) 100 UNIT/ML injection     JARDIANCE 10 MG TABS tablet Take 10 mg by mouth daily. (Patient not taking: Reported on 01/26/2021)    lactulose (CHRONULAC) 10 GM/15ML solution Take 30 mLs (20 g total) by mouth daily as needed for mild constipation.    LEVEMIR 100 UNIT/ML injection Inject into the skin.    losartan (COZAAR) 100 MG tablet Take 100 mg by mouth daily.    metFORMIN (GLUCOPHAGE) 1000 MG tablet Take 1,000 mg by mouth 2 (two) times daily.    mupirocin ointment (BACTROBAN) 2 % SMARTSIG:1 Application Topical 2-3 Times Daily     NIFEdipine (PROCARDIA XL/NIFEDICAL XL) 60 MG 24 hr tablet     OneTouch Delica Lancets 40J MISC USE 1 LANCET AS DIRECTED THREE TIMES DAILY    polyethylene glycol (MIRALAX / GLYCOLAX) packet Take 17 g by mouth daily as needed for mild constipation.    polyethylene glycol-electrolytes (NULYTELY) 420 g solution Prepare according to package instructions. Starting at 5:00 PM: Drink one 8 oz glass of mixture every 15 minutes until you finish half of the jug. Five hours prior to procedure, drink 8 oz glass of mixture every 15 minutes until it is all gone. Make sure you do not drink anything 4 hours prior to your procedure.    potassium chloride 20 MEQ TBCR Take 40 mEq by mouth 2 (two) times daily for 3 days.    potassium chloride SA (KLOR-CON M20) 20 MEQ tablet Take 1 tablet (20 mEq total) by mouth daily. (Patient not taking: Reported on 01/26/2021)    spironolactone (ALDACTONE) 25 MG tablet Take 25 mg by mouth daily.    traMADol (ULTRAM) 50 MG tablet Take 0.5-1 tablets (25-50 mg total) by mouth every 8 (eight) hours as needed for severe pain.    [DISCONTINUED] losartan (COZAAR) 25 MG tablet Take by mouth.    [DISCONTINUED] NIFEdipine (PROCARDIA-XL/NIFEDICAL-XL) 30 MG 24 hr tablet Take 120 mg by mouth daily.    No facility-administered encounter medications on file as of 09/06/2021.    Surgical History: Past Surgical History:  Procedure Laterality Date   AMPUTATION Right 03/31/2017   Procedure: RIGHT FOOT 1ST AND 2ND RAY AMPUTATION;  Surgeon: Newt Minion, MD;  Location: [place];  Service: Orthopedics;  Laterality: Right;   AMPUTATION Right 04/01/2017   Procedure: AMPUTATION BELOW KNEE;  Surgeon: Newt Minion, MD;  Location: [place];  Service: Orthopedics;  Laterality: Right;   COLONOSCOPY WITH PROPOFOL N/A 10/02/2017   Procedure: COLONOSCOPY WITH PROPOFOL;  Surgeon: [name], MD;  Location: [facility] END[facility]COPY;  Service: Gastroenterology;  Laterality: N/A;   COLONOSCOPY WITH PROPOFOL N/A 11/06/2020    Procedure: COLONOSCOPY WITH PROPOFOL;  Surgeon: [name], MD;  Location: [facility] ENDOSCOPY;  Service: Gastroenterology;  Laterality: N/A;   CORNEAL TRANSPLANT     EYE SURGERY      Medical History: Past Medical History:  Diagnosis Date   Complication of anesthesia    COVID-29 Dec 2018   Diabetes mellitus without complication ([place])    Heart murmur    Hyperlipidemia    Hypertension    Obesity (BMI 30-39.9) 06/22/2020   Sleep apnea     Family History: Non contributory to the present illness  Social History: Social History   Socioeconomic History   Marital status: Single    Spouse name: Not on file   Number of children: Not on file   Years  of education: Not on file   Highest education level: Not on file  Occupational History   Not on file  Tobacco Use   Smoking status: Never   Smokeless tobacco: Never  Vaping Use   Vaping Use: Never used  Substance and Sexual Activity   Alcohol use: No   Drug use: Never   Sexual activity: Not on file  Other Topics Concern   Not on file  Social History Narrative   Not on file   Social Determinants of Health   Financial Resource Strain: Not on file  Food Insecurity: Not on file  Transportation Needs: Not on file  Physical Activity: Not on file  Stress: Not on file  Social Connections: Not on file  Intimate Partner Violence: Not on file    Vital Signs: Blood pressure (!) 179/91, pulse 62, resp. rate 16, height '6\' 4"'$  (1.93 m), weight (!) 340 lb (154.2 kg), SpO2 95 %. Body mass index is 41.39 kg/m.    Examination: General Appearance: The patient is well-developed, well-nourished, and in no distress. Neck Circumference: 48 cm Skin: Gross inspection of skin unremarkable. Head: normocephalic, no gross deformities. Eyes: no gross deformities noted. ENT: ears appear grossly normal Neurologic: Alert and oriented. No involuntary movements.  STOP BANG RISK ASSESSMENT S (snore) Have you been told that you snore?     NO   T  (tired) Are you often tired, fatigued, or sleepy during the day?   NO  O (obstruction) Do you stop breathing, choke, or gasp during sleep? NO   P (pressure) Do you have or are you being treated for high blood pressure? YES   B (BMI) Is your body index greater than 35 kg/m? YES   A (age) Are you 21 years old or older? YES   N (neck) Do you have a neck circumference greater than 16 inches?   YES   G (gender) Are you a male? YES   TOTAL STOP/BANG "YES" ANSWERS 5       A STOP-Bang score of 2 or less is considered low risk, and a score of 5 or more is high risk for having either moderate or severe OSA. For people who score 3 or 4, doctors may need to perform further assessment to determine how likely they are to have OSA.         EPWORTH SLEEPINESS SCALE:  Scale:  (0)= no chance of dozing; (1)= slight chance of dozing; (2)= moderate chance of dozing; (3)= high chance of dozing  Chance  Situtation    Sitting and reading: 2    Watching TV: 2    Sitting Inactive in public: 1    As a passenger in car: 1      Lying down to rest: 3    Sitting and talking: 0    Sitting quielty after lunch: 1    In a car, stopped in traffic: 0   TOTAL SCORE:   10 out of 24    SLEEP STUDIES:  PSG (11/14/2007)  AHI 77, SPO2 85% TITRATION (12/12/2007)  CPAP at 12 cmh20 TITRATION (08/03/2020)  CPAP at 10 cmh20 TITRATION (09/01/2021)  2 week trial of auto BIPAP EPAP min 8 cm, PS 6 cm, IPAP max 22 cm   CPAP COMPLIANCE DATA:  Date Range: 08/06/21 - 09/04/21  Average Daily Use: 5 hours 43 minutes  Median Use: 6 hours 13 minutes  Compliance for > 4 Hours: 25 days  AHI: 9.1 respiratory events per hour  Days Used:  28/30  Mask Leak: 47  95th Percentile Pressure: 16.1 cmh20         LABS: No results found for this or any previous visit (from the past 2160 hour(s)).  Radiology: DG Lumbar Spine 2-3 Views  Result Date: 02/12/2021 CLINICAL DATA:  Low back pain EXAM: LUMBAR SPINE  - 2-3 VIEW COMPARISON:  05/31/2004 FINDINGS: No recent fracture is seen. Degenerative changes are noted with disc space narrowing, bony spurs and facet hypertrophy. If it is clinically necessary to evaluate the spinal contents, follow-up MRI may be considered. There is interval worsening of degenerative changes. Alignment of posterior margins of vertebral bodies is unremarkable. IMPRESSION: No recent fracture is seen in the lumbar spine. Degenerative changes are noted in the lumbar spine and lower thoracic spine with interval worsening. Electronically Signed   By: Elmer Picker M.D.   On: 02/12/2021 14:21   DG ELBOW COMPLETE RIGHT (3+VIEW)  Result Date: 02/11/2021 CLINICAL DATA:  Fall.  Right elbow pain. EXAM: RIGHT ELBOW - COMPLETE 3+ VIEW COMPARISON:  None. FINDINGS: There is moderate medial elbow joint space narrowing and peripheral degenerative spurring at the coronoid process and trochlea. Moderate tip of the coronoid process degenerative spurring on lateral view. No definite joint effusion. Mild radial head-neck junction degenerative spurring. No acute fracture or dislocation. 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 Electronically Signed   By: Yvonne Kendall M.D.   On: 02/11/2021 19:22   DG Hand Complete Right  Result Date: 02/11/2021 CLINICAL DATA:  Bilateral hand pain. EXAM: RIGHT HAND - COMPLETE 3+ VIEW; LEFT HAND - COMPLETE 3+ VIEW COMPARISON:  None. FINDINGS: Right hand: Moderate thumb carpometacarpal joint space narrowing, subchondral sclerosis and peripheral osteophytosis degenerative changes. Mild interphalangeal joint space narrowing diffusely. There is a 4 mm lucent cyst versus erosion within the plantar lateral aspect of the distal head of the proximal phalanx of the index finger with surrounding mild soft tissue swelling. Mild triscaphe joint space narrowing and subchondral sclerosis degenerative change. No acute fracture or dislocation. Left hand:  Mild-to-moderate thumb carpometacarpal joint space narrowing and subchondral cystic change degenerative change. Mild joint space narrowing of the second through fifth digit DIP joints. Mild triscaphe joint space narrowing and degenerative cortical irregularity. No acute fracture or dislocation. IMPRESSION: Bilateral hand arthritis in a distribution suggesting osteoarthritis, greatest at the bilateral thumb carpometacarpal joints. There is a peripheral erosion at the right index finger which raises the question of an inflammatory arthropathy such as gout. Recommend clinical correlation. Electronically Signed   By: Yvonne Kendall M.D.   On: 02/11/2021 18:59   DG Hand Complete Left  Result Date: 02/11/2021 CLINICAL DATA:  Bilateral hand pain. EXAM: RIGHT HAND - COMPLETE 3+ VIEW; LEFT HAND - COMPLETE 3+ VIEW COMPARISON:  None. FINDINGS: Right hand: Moderate thumb carpometacarpal joint space narrowing, subchondral sclerosis and peripheral osteophytosis degenerative changes. Mild interphalangeal joint space narrowing diffusely. There is a 4 mm lucent cyst versus erosion within the plantar lateral aspect of the distal head of the proximal phalanx of the index finger with surrounding mild soft tissue swelling. Mild triscaphe joint space narrowing and subchondral sclerosis degenerative change. No acute fracture or dislocation. Left hand: Mild-to-moderate thumb carpometacarpal joint space narrowing and subchondral cystic change degenerative change. Mild joint space narrowing of the second through fifth digit DIP joints. Mild triscaphe joint space narrowing and degenerative cortical irregularity. No acute fracture or dislocation. IMPRESSION: Bilateral hand arthritis in a distribution suggesting osteoarthritis, greatest at the bilateral thumb carpometacarpal joints. There  is a peripheral erosion at the right index finger which raises the question of an inflammatory arthropathy such as gout. Recommend clinical correlation.  Electronically Signed   By: Yvonne Kendall M.D.   On: 02/11/2021 18:59    No results found.  No results found.    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   CPAP use counseling 07/26/2021   Morbid obesity ([place]) 07/26/2021   OSA (obstructive sleep apnea) 06/22/2020   Obesity (BMI 30-39.9) 06/22/2020   Pain of both elbows 12/23/2019   Elevated lactic acid level    Diabetic ketoacidosis without coma associated with type 2 diabetes mellitus ([place])    Cellulitis of left foot 12/17/2018   Pain due to onychomycosis of toenail of right foot 07/05/2018   Abnormality of gait 10/12/2017   Type 2 diabetes mellitus with hyperglycemia, with long-term current use of insulin (HCC)    Flatulence    Hypomagnesemia    Unilateral complete BKA, right, sequela (HCC)    Benign essential HTN    Hypoalbuminemia due to protein-calorie malnutrition ([place])    S/P below knee amputation, right ([place]) 04/12/2017   Acute blood loss anemia    Other encephalopathy 04/08/2017   Encephalopathy 04/08/2017   Altered mental status 04/08/2017   Labile blood pressure    Labile blood glucose    Drug induced constipation    Stage 3 chronic kidney disease ([place])    Bacteremia    S/P unilateral BKA (below knee amputation), right ([place]) 04/04/2017   PAD (peripheral artery disease) ([place])    Type 2 diabetes mellitus with right diabetic foot ulcer ([place])    Post-operative pain    Legally blind    Upper GI bleed    Streptococcal bacteremia 04/01/2017   Hypokalemia 03/30/2017   Uncontrolled type 2 diabetes mellitus with hyperglycemia, with long-term current use of insulin ([place]) 03/30/2017   Type 2 diabetes mellitus with peripheral neuropathy ([place]) 03/30/2017   AKI (acute kidney injury) ([place]) 03/30/2017   CKD stage 3 due to type 2 diabetes mellitus ([place]) 03/30/2017   Sepsis ([place]) 03/30/2017   Heart murmur 11/22/2016   Hyperlipidemia associated with  type 2 diabetes mellitus ([place]) 11/22/2016   Obstructive sleep apnea syndrome 11/22/2016   Essential hypertension 12/17/2012    1. Complex sleep apnea syndrome The patient does tolerate PAP and reports no benefit from PAP use. His apnea is not controlled.He underwent a bipap titration and he has significant central apneas. Recommendation is to have  a 2 week trial of auto bipap. If this is ineffective then he will require bipap asv. We will plan to see him back after his 2 week trial.  The patient was reminded how to clean equipment and advised to replace supplies routinely. The patient was also counselled on weight loss. The compliance is good. The AHI is 9.1.  Complex sleep apnea syndrome- not controlled. Auto bipap 2 week trial then he will follow up.    2. CPAP use counseling  Counseling: had a lengthy discussion with the patient regarding the importance of PAP therapy in management of the sleep apnea. Patient appears to understand the risk factor reduction and also understands the risks associated with untreated sleep apnea. Patient will try to make a good faith effort to remain compliant with therapy. Also instructed the patient on proper cleaning of the device including the water must be changed daily if possible and use of distilled water is  preferred. Patient understands that the machine should be regularly cleaned with appropriate recommended cleaning solutions that do not damage the PAP machine for example given white vinegar and water rinses. Other methods such as ozone treatment may not be as good as these simple methods to achieve cleaning.   3. Essential hypertension Hypertension Counseling:   The following hypertensive lifestyle modification were recommended and discussed:  1. Limiting alcohol intake to less than 1 oz/day of ethanol:(24 oz of beer or 8 oz of wine or 2 oz of 100-proof whiskey). 2. Take baby ASA 81 mg daily. 3. Importance of regular aerobic exercise and losing  weight. 4. Reduce dietary saturated fat and cholesterol intake for overall cardiovascular health. 5. Maintaining adequate dietary potassium, calcium, and magnesium intake. 6. Regular monitoring of the blood pressure. 7. Reduce sodium intake to less than 100 mmol/day (less than 2.3 gm of sodium or less than 6 gm of sodium choride)     General Counseling: I have discussed the findings of the evaluation and examination with Janeann Forehand.  I have also discussed any further diagnostic evaluation thatmay be needed or ordered today. Jesse verbalizes understanding of the findings of todays visit. We also reviewed his medications today and discussed drug interactions and side effects including but not limited excessive drowsiness and altered mental states. We also discussed that there is always a risk not just to him but also people around him. he has been encouraged to call the office with any questions or concerns that should arise related to todays visit.  No orders of the defined types were placed in this encounter.       I have personally obtained a history, examined the patient, evaluated laboratory and imaging results, formulated the assessment and plan and placed orders. This patient was seen today by Tressie Ellis, PA-C in collaboration with Dr. Devona Konig.   Allyne Gee, MD [facility] Diplomate ABMS Pulmonary Critical Care Medicine and Sleep Medicine

## 2021-09-06 NOTE — Patient Instructions (Signed)

## 2021-09-23 NOTE — Progress Notes (Signed)
Pt's auto bipap trial shows improvement in AHI. He will be set up with bipap and scheduled for follow up 30d after set up.

## 2021-09-27 ENCOUNTER — Ambulatory Visit: Payer: Medicare Other | Admitting: Physical Medicine and Rehabilitation

## 2021-09-27 ENCOUNTER — Ambulatory Visit: Payer: Medicare Other | Admitting: Internal Medicine

## 2021-10-01 ENCOUNTER — Encounter: Payer: Self-pay | Admitting: Physical Medicine and Rehabilitation

## 2021-10-01 ENCOUNTER — Encounter
Payer: Medicare Other | Attending: Physical Medicine and Rehabilitation | Admitting: Physical Medicine and Rehabilitation

## 2021-10-01 VITALS — BP 144/84 | HR 63 | Ht 77.0 in | Wt 355.2 lb

## 2021-10-01 DIAGNOSIS — M7989 Other specified soft tissue disorders: Secondary | ICD-10-CM | POA: Insufficient documentation

## 2021-10-01 DIAGNOSIS — M5137 Other intervertebral disc degeneration, lumbosacral region: Secondary | ICD-10-CM | POA: Insufficient documentation

## 2021-10-01 DIAGNOSIS — R790 Abnormal level of blood mineral: Secondary | ICD-10-CM | POA: Diagnosis present

## 2021-10-01 DIAGNOSIS — Z794 Long term (current) use of insulin: Secondary | ICD-10-CM | POA: Insufficient documentation

## 2021-10-01 DIAGNOSIS — E1165 Type 2 diabetes mellitus with hyperglycemia: Secondary | ICD-10-CM | POA: Diagnosis present

## 2021-10-01 DIAGNOSIS — E559 Vitamin D deficiency, unspecified: Secondary | ICD-10-CM | POA: Insufficient documentation

## 2021-10-01 NOTE — Progress Notes (Signed)
Subjective:    Patient ID: Trevor [name], male    DOB: 10-15-1955, 66 y.o.   MRN: [id number]  HPI  Right-handed male with history of diabetes mellitus, hypertension, legally blind, and CKD presents for follow-up of right BKA and low back pain.   1) Low back pain Currently has pain in his lower back It hurts mostly when he is walking He gets winded standing and walking He finds the Tramadol helpful, take intermediately.  He needs a refill of the Tramadol today -he has never had Xrs.  -sitting relieves his pain -not doing heating pad -does not feel he can afford extracorporeal shockwave therapy -he cannot apply lidocaine patch very well, he has nurse to help.  -last time he worked with PT he felt that PT was dismissive and he was discharged due to lack of progressive.   2) Right elbow pain -fell on concrete. -Xrs ordered at [facility].    3) Lower extremity swelling: - limited in his walking due to his back pain -lasix seems to be ineffective.   Last clinic visit 05/30/19.  Since that time, pt states he completed therapies. He is wearing his new prosthesis without issue. Denies falls, uses cane for safety. He complains of elbow pain and was evaluated.    Pain Inventory Average Pain 2 Pain Right Now 0 My pain is dull  In the last 24 hours, has pain interfered with the following? General activity 7 Relation with others 10 Enjoyment of life 10 What TIME of day is your pain at its worst? night Sleep (in general) Fair  Pain is worse with: walking and standing Pain improves with: rest and medication Relief from Meds: 8  Mobility walk with assistance use a cane ability to climb steps?  yes do you drive?  yes  Function disabled: date disabled . retired I need assistance with the following:  bathing and household duties  Neuro/Psych trouble walking  Physicians involved in your care Any changes since last visit?  no   History reviewed. No pertinent family  history. Social History   Socioeconomic History   Marital status: Single    Spouse name: Not on file   Number of children: Not on file   Years of education: Not on file   Highest education level: Not on file  Occupational History   Not on file  Tobacco Use   Smoking status: Never   Smokeless tobacco: Never  Vaping Use   Vaping Use: Never used  Substance and Sexual Activity   Alcohol use: No   Drug use: Never   Sexual activity: Not on file  Other Topics Concern   Not on file  Social History Narrative   Not on file   Social Determinants of Health   Financial Resource Strain: Not on file  Food Insecurity: Not on file  Transportation Needs: Not on file  Physical Activity: Not on file  Stress: Not on file  Social Connections: Not on file   Past Surgical History:  Procedure Laterality Date   AMPUTATION Right 03/31/2017   Procedure: RIGHT FOOT 1ST AND 2ND RAY AMPUTATION;  Surgeon: Newt Minion, MD;  Location: [place];  Service: Orthopedics;  Laterality: Right;   AMPUTATION Right 04/01/2017   Procedure: AMPUTATION BELOW KNEE;  Surgeon: Newt Minion, MD;  Location: [place];  Service: Orthopedics;  Laterality: Right;   COLONOSCOPY WITH PROPOFOL N/A 10/02/2017   Procedure: COLONOSCOPY WITH PROPOFOL;  Surgeon: [name], MD;  Location: [facility] ENDOSCOPY;  Service:  Gastroenterology;  Laterality: N/A;   COLONOSCOPY WITH PROPOFOL N/A 11/06/2020   Procedure: COLONOSCOPY WITH PROPOFOL;  Surgeon: [name], MD;  Location: [facility] ENDOSCOPY;  Service: Gastroenterology;  Laterality: N/A;   CORNEAL TRANSPLANT     EYE SURGERY     Past Medical History:  Diagnosis Date   Complication of anesthesia    COVID-29 Dec 2018   Diabetes mellitus without complication (HCC)    Heart murmur    Hyperlipidemia    Hypertension    Obesity (BMI 30-39.9) 06/22/2020   Sleep apnea    Ht '6\' 5"'$  (1.956 m)   Wt (!) 355 lb 3.2 oz (161.1 kg)   BMI 42.12 kg/m   Opioid Risk Score:   Fall Risk Score:   `1  Depression screen [facility] 2/9     10/01/2021   10:45 AM 10/12/2017    9:45 AM 08/10/2017   10:03 AM 06/26/2017   10:43 AM 05/11/2017    1:00 PM 03/29/2017    3:44 PM 11/22/2016   10:57 AM  Depression screen PHQ 2/9  Decreased Interest 0 0 0 0 0 2 0  Down, Depressed, Hopeless 0 0 0 0 0 0 0  PHQ - 2 Score 0 0 0 0 0 2 0  Altered sleeping    1 1 0 1  Tired, decreased energy    1 0 3 1  Change in appetite     0 1 0  Feeling bad or failure about yourself     0 0 0 0  Trouble concentrating    0 0 0 0  Moving slowly or fidgety/restless    0 0 1 1  Suicidal thoughts    0 0 0 0  PHQ-9 Score    '2 1 7 3   '$ Review of Systems  Constitutional: Negative.   HENT: Negative.    Eyes: Negative.   Respiratory: Negative.    Cardiovascular: Negative.   Gastrointestinal: Negative.   Endocrine: Negative.        High blood sugar  Genitourinary: Negative.   Musculoskeletal:  Positive for arthralgias, back pain and gait problem.  Skin: Negative.   Allergic/Immunologic: Negative.   Neurological:        Tingling   Hematological: Negative.   Psychiatric/Behavioral: Negative.    All other systems reviewed and are negative.    Objective:   Physical Exam  Constitutional: No distress . Vital signs reviewed. BMI 355lbs HENT: Normocephalic.  Atraumatic. Eyes: EOMI. No discharge. Cardiovascular: No JVD.   Respiratory: Normal effort.  No stridor.   GI: Non-distended.   Skin: Skin tear to elbow.  Intact. BKA healed. Psych: Normal mood.  Normal behavior. Musc: No edema in extremities.  No tenderness in extremities. Neuro: Alert Motor:  LLE: 5/5 proximal to distal, RLE prosthesis in place. Can ambulate well with prosthesis.    Assessment & Plan:  Right-handed male with history of diabetes mellitus, hypertension, legally blind, and CKD presents for follow up for right BKA.     1.  Right transtibial amputation              Continue HEP             Released by Ortho             New prosthesis obtained  ~06/2018, good function after prosthesis   2. Gait abnormality             Continue HEP  Cont cane for safety  3. B/l elbow pain  Refilled Volaren gel  4. Low back pain -Provided with a pain relief journal and discussed that it contains foods and lifestyle tips to naturally help to improve pain. Discussed that these lifestyle strategies are also very good for health unlike some medications which can have negative side effects. Discussed that the act of keeping a journal can be therapeutic and helpful to realize patterns what helps to trigger and alleviate pain.   -MRI lumbar spine ordered to assess for facet arthropathy -discussed joining a gym -discussed that it would be great to join gym.   5. Leg swelling -check BNP -will call with results on Monday -discussed plan if BNP is elevated or normal  6. Screening for vitamin D deficiency: -check vitamin D level

## 2021-10-02 LAB — VITAMIN D 25 HYDROXY (VIT D DEFICIENCY, FRACTURES): Vit D, 25-Hydroxy: 13.3 ng/mL — ABNORMAL LOW (ref 30.0–100.0)

## 2021-10-02 LAB — B12 AND FOLATE PANEL
Folate: 5.4 ng/mL (ref >3.0)
Vitamin B-12: 645 pg/mL (ref 232–1245)

## 2021-10-02 LAB — BASIC METABOLIC PANEL
BUN/Creatinine Ratio: 15 (ref 10–24)
BUN: 20 mg/dL (ref 8–27)
CO2: 23 mmol/L (ref 20–29)
Calcium: 9.3 mg/dL (ref 8.6–10.2)
Chloride: 102 mmol/L (ref 96–106)
Creatinine, Ser: 1.37 mg/dL — ABNORMAL HIGH (ref 0.76–1.27)
Glucose: 140 mg/dL — ABNORMAL HIGH (ref 70–99)
Potassium: 3.8 mmol/L (ref 3.5–5.2)
Sodium: 140 mmol/L (ref 134–144)
eGFR: 57 mL/min/{1.73_m2} — ABNORMAL LOW (ref >59)

## 2021-10-02 LAB — MAGNESIUM: Magnesium: 1.7 mg/dL (ref 1.6–2.3)

## 2021-10-02 LAB — BRAIN NATRIURETIC PEPTIDE: BNP: 55.1 pg/mL (ref 0.0–100.0)

## 2021-10-04 ENCOUNTER — Encounter (HOSPITAL_BASED_OUTPATIENT_CLINIC_OR_DEPARTMENT_OTHER): Payer: Medicare Other | Admitting: Physical Medicine and Rehabilitation

## 2021-10-04 DIAGNOSIS — E559 Vitamin D deficiency, unspecified: Secondary | ICD-10-CM | POA: Diagnosis not present

## 2021-10-04 DIAGNOSIS — E1165 Type 2 diabetes mellitus with hyperglycemia: Secondary | ICD-10-CM

## 2021-10-04 DIAGNOSIS — R790 Abnormal level of blood mineral: Secondary | ICD-10-CM | POA: Diagnosis not present

## 2021-10-04 DIAGNOSIS — M7989 Other specified soft tissue disorders: Secondary | ICD-10-CM | POA: Diagnosis not present

## 2021-10-04 DIAGNOSIS — Z794 Long term (current) use of insulin: Secondary | ICD-10-CM

## 2021-10-04 MED ORDER — VITAMIN D (ERGOCALCIFEROL) 1.25 MG (50000 UNIT) PO CAPS: 50000.0000 [IU] | ORAL_CAPSULE | ORAL | 0 refills | Status: DC

## 2021-10-04 NOTE — Progress Notes (Unsigned)
Subjective:    Patient ID: Trevor [name], male    DOB: 1955-09-14, 66 y.o.   MRN: [id number]  HPI  Right-handed male with history of diabetes mellitus, hypertension, legally blind, and CKD presents for follow-up of right BKA and low back pain.   1) Low back pain Currently has pain in his lower back It hurts mostly when he is walking He gets winded standing and walking He finds the Tramadol helpful, take intermediately.  He needs a refill of the Tramadol today -he has never had Xrs.  -sitting relieves his pain -not doing heating pad -does not feel he can afford extracorporeal shockwave therapy -he cannot apply lidocaine patch very well, he has nurse to help.  -last time he worked with PT he felt that PT was dismissive and he was discharged due to lack of progressive.   2) Right elbow pain -fell on concrete. -Xrs ordered at [facility].    3) Lower extremity swelling: - limited in his walking due to his back pain -lasix seems to be ineffective- he has been taking '40mg'$    Last clinic visit 05/30/19.  Since that time, pt states he completed therapies. He is wearing his new prosthesis without issue. Denies falls, uses cane for safety. He complains of elbow pain and was evaluated.    Pain Inventory Average Pain 2 Pain Right Now 0 My pain is dull  In the last 24 hours, has pain interfered with the following? General activity 7 Relation with others 10 Enjoyment of life 10 What TIME of day is your pain at its worst? night Sleep (in general) Fair  Pain is worse with: walking and standing Pain improves with: rest and medication Relief from Meds: 8  Mobility walk with assistance use a cane ability to climb steps?  yes do you drive?  yes  Function disabled: date disabled . retired I need assistance with the following:  bathing and household duties  Neuro/Psych trouble walking  Physicians involved in your care Any changes since last visit?  no   No family history on  file. Social History   Socioeconomic History   Marital status: Single    Spouse name: Not on file   Number of children: Not on file   Years of education: Not on file   Highest education level: Not on file  Occupational History   Not on file  Tobacco Use   Smoking status: Never   Smokeless tobacco: Never  Vaping Use   Vaping Use: Never used  Substance and Sexual Activity   Alcohol use: No   Drug use: Never   Sexual activity: Not on file  Other Topics Concern   Not on file  Social History Narrative   Not on file   Social Determinants of Health   Financial Resource Strain: Not on file  Food Insecurity: Not on file  Transportation Needs: Not on file  Physical Activity: Not on file  Stress: Not on file  Social Connections: Not on file   Past Surgical History:  Procedure Laterality Date   AMPUTATION Right 03/31/2017   Procedure: RIGHT FOOT 1ST AND 2ND RAY AMPUTATION;  Surgeon: Newt Minion, MD;  Location: [place];  Service: Orthopedics;  Laterality: Right;   AMPUTATION Right 04/01/2017   Procedure: AMPUTATION BELOW KNEE;  Surgeon: Newt Minion, MD;  Location: [place];  Service: Orthopedics;  Laterality: Right;   COLONOSCOPY WITH PROPOFOL N/A 10/02/2017   Procedure: COLONOSCOPY WITH PROPOFOL;  Surgeon: [name], MD;  Location:  [facility] ENDOSCOPY;  Service: Gastroenterology;  Laterality: N/A;   COLONOSCOPY WITH PROPOFOL N/A 11/06/2020   Procedure: COLONOSCOPY WITH PROPOFOL;  Surgeon: [name], MD;  Location: [facility] ENDOSCOPY;  Service: Gastroenterology;  Laterality: N/A;   CORNEAL TRANSPLANT     EYE SURGERY     Past Medical History:  Diagnosis Date   Complication of anesthesia    COVID-29 Dec 2018   Diabetes mellitus without complication ([place])    Heart murmur    Hyperlipidemia    Hypertension    Obesity (BMI 30-39.9) 06/22/2020   Sleep apnea    There were no vitals taken for this visit.  Opioid Risk Score:   Fall Risk Score:  `1  Depression screen [facility]  2/9     10/01/2021   10:45 AM 10/12/2017    9:45 AM 08/10/2017   10:03 AM 06/26/2017   10:43 AM 05/11/2017    1:00 PM 03/29/2017    3:44 PM 11/22/2016   10:57 AM  Depression screen PHQ 2/9  Decreased Interest 0 0 0 0 0 2 0  Down, Depressed, Hopeless 0 0 0 0 0 0 0  PHQ - 2 Score 0 0 0 0 0 2 0  Altered sleeping    1 1 0 1  Tired, decreased energy    1 0 3 1  Change in appetite     0 1 0  Feeling bad or failure about yourself     0 0 0 0  Trouble concentrating    0 0 0 0  Moving slowly or fidgety/restless    0 0 1 1  Suicidal thoughts    0 0 0 0  PHQ-9 Score    '2 1 7 3   '$ Review of Systems  Constitutional: Negative.   HENT: Negative.    Eyes: Negative.   Respiratory: Negative.    Cardiovascular: Negative.   Gastrointestinal: Negative.   Endocrine: Negative.        High blood sugar  Genitourinary: Negative.   Musculoskeletal:  Positive for arthralgias, back pain and gait problem.  Skin: Negative.   Allergic/Immunologic: Negative.   Neurological:        Tingling   Hematological: Negative.   Psychiatric/Behavioral: Negative.    All other systems reviewed and are negative.     Objective:   Physical Exam  Constitutional: No distress . Vital signs reviewed. BMI 355lbs HENT: Normocephalic.  Atraumatic. Eyes: EOMI. No discharge. Cardiovascular: No JVD.   Respiratory: Normal effort.  No stridor.   GI: Non-distended.   Skin: Skin tear to elbow.  Intact. BKA healed. Psych: Normal mood.  Normal behavior. Musc: No edema in extremities.  No tenderness in extremities. Neuro: Alert Motor:  LLE: 5/5 proximal to distal, RLE prosthesis in place. Can ambulate well with prosthesis.    Assessment & Plan:  Right-handed male with history of diabetes mellitus, hypertension, legally blind, and CKD presents for follow up for right BKA.     1.  Right transtibial amputation              Continue HEP             Released by Ortho             New prosthesis obtained ~06/2018, good function  after prosthesis   2. Gait abnormality             Continue HEP             Cont cane for  safety  3. B/l elbow pain  Refilled Volaren gel  4. Low back pain -Provided with a pain relief journal and discussed that it contains foods and lifestyle tips to naturally help to improve pain. Discussed that these lifestyle strategies are also very good for health unlike some medications which can have negative side effects. Discussed that the act of keeping a journal can be therapeutic and helpful to realize patterns what helps to trigger and alleviate pain.   -MRI lumbar spine ordered to assess for facet arthropathy -discussed joining a gym -discussed that it would be great to join gym.   5. Leg swelling -check BNP -will call with results on Monday -discussed plan if BNP is elevated or normal  6. Screening for vitamin D deficiency: -check vitamin D level

## 2021-10-12 ENCOUNTER — Ambulatory Visit: Payer: Medicare Other | Admitting: Podiatry

## 2021-10-19 ENCOUNTER — Encounter: Payer: Self-pay | Admitting: Podiatry

## 2021-10-19 ENCOUNTER — Ambulatory Visit (INDEPENDENT_AMBULATORY_CARE_PROVIDER_SITE_OTHER): Payer: Medicare Other | Admitting: Podiatry

## 2021-10-19 DIAGNOSIS — E1142 Type 2 diabetes mellitus with diabetic polyneuropathy: Secondary | ICD-10-CM

## 2021-10-19 DIAGNOSIS — M79675 Pain in left toe(s): Secondary | ICD-10-CM

## 2021-10-19 DIAGNOSIS — B351 Tinea unguium: Secondary | ICD-10-CM

## 2021-10-19 NOTE — Progress Notes (Signed)
  Subjective:  Patient ID: Trevor [name], male    DOB: Nov 25, 1955,  MRN: [id number]  Chief Complaint  Patient presents with   Nail Problem    "Cut my toenails."    66 y.o. male returns for the above complaint.  Patient presents with thickened elongated mycotic toenails of the left lower extremity x5.  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  Patient states that they have been painful when ambulating.  He is not able to debride down himself.  He is a type II diabetic with last A1c of 9.6.  He does not have any secondary complaints today.    Objective:  There were no vitals filed for this visit. Podiatric Exam: Vascular: dorsalis pedis and posterior tibial pulses are palpable left capillary return is immediate. Temperature gradient is WNL. Skin turgor WNL  Sensorium: Normal Semmes Weinstein monofilament test. Normal tactile sensation left Nail Exam: Pt has thick disfigured discolored nails with subungual debris noted bilateral entire nail hallux through fifth toenails Ulcer Exam: There is no evidence of ulcer or pre-ulcerative changes or infection. Orthopedic Exam: Muscle tone and strength are WNL. No limitations in general ROM. No crepitus or effusions noted. HAV   left.  Hammer toes 2-5   left.  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 No infection or ulcers.  Severe dryness noted to both lower extremity plantar  Assessment & Plan:  Patient was evaluated and treated and all questions answered.  Hammertoes second and third -I explained to patient the etiology of hammertoe measurement options were discussed.  I educated him on taking the pressure off of the hammertoe by getting wider shoes and making shoe gear modification.  Toe protectors were also dispensed to take the pressure off of the hammertoe.  Ultimately patient is not an ideal surgical candidate for hammertoe correction. -Toe protectors were  dispensed  Xerosis/fissuring plantar foot -I explained to the patient the etiology of xerosis and various treatment options were extensively discussed.  I explained to the patient the importance of maintaining moisturization of the skin with application of over-the-counter lotion such as Eucerin or Luciderm.  Given that patient has failed over-the-counter therapy I believe patient will benefit from ammonium lactate cream.  Ammonium lactate was sent to the pharmacy.   Swelling to the left lower extremity -Improving.  Onychomycosis with pain x5 -Nails palliatively debrided as below. -Educated on self-care -Penlac was also dispensed to help with topical application decrease the fungal growth.  I have asked him to apply twice a day for 6 months.  Patient states understanding  Procedure: Nail Debridement Rationale: pain  Type of Debridement: manual, sharp debridement. Instrumentation: Nail nipper, rotary burr. Number of Nails: 5   Procedures and Treatment: Consent by patient was obtained for treatment procedures. The patient understood the discussion of treatment and procedures well. All questions were answered thoroughly reviewed. Debridement of mycotic and hypertrophic toenails, 1 through 5 bilateral and clearing of subungual debris. No ulceration, no infection noted.  Return Visit-Office Procedure: Patient instructed to return to the office for a follow up visit 3 months for continued evaluation and treatment.  Boneta Lucks, DPM    No follow-ups on file.

## 2021-11-11 ENCOUNTER — Ambulatory Visit
Admission: RE | Admit: 2021-11-11 | Discharge: 2021-11-11 | Disposition: A | Discharge status: Home / Self Care | Payer: Medicare Other | Source: Ambulatory Visit | Attending: Physical Medicine and Rehabilitation | Admitting: Physical Medicine and Rehabilitation

## 2021-11-11 DIAGNOSIS — M5137 Other intervertebral disc degeneration, lumbosacral region: Secondary | ICD-10-CM | POA: Insufficient documentation

## 2021-11-15 ENCOUNTER — Encounter
Payer: Medicare Other | Attending: Physical Medicine and Rehabilitation | Admitting: Physical Medicine and Rehabilitation

## 2021-11-15 DIAGNOSIS — S88111S Complete traumatic amputation at level between knee and ankle, right lower leg, sequela: Secondary | ICD-10-CM | POA: Insufficient documentation

## 2021-11-15 DIAGNOSIS — M48061 Spinal stenosis, lumbar region without neurogenic claudication: Secondary | ICD-10-CM | POA: Diagnosis not present

## 2021-11-15 NOTE — Progress Notes (Signed)
Subjective:    Patient ID: Trevor [name], male    DOB: [birth year], 66 y.o.   MRN: [id number]  HPI  An audio/video tele-health visit is felt to be the most appropriate encounter for this patient at this time. This is a follow up tele-visit via phone. The patient is at home. MD is at office. Prior to scheduling this appointment, our staff discussed the limitations of evaluation and management by telemedicine and the availability of in-person appointments. The patient expressed understanding and agreed to proceed.   Right-handed male with history of diabetes mellitus, hypertension, legally blind, and CKD presents for f/u of right BKA,  low back pain, kidney injury, vitamin D deficiency, suboptimal magnesium levels, lower extremity swelling.  1) Low back pain Currently has pain in his lower back He asks about the results of his most recent Lumbar MRI He asked whether a more advanced prosthesis would be him to better ambulate.  It hurts mostly when he is walking He gets winded standing and walking He finds the Tramadol helpful, takes intermediately, does not need a refill today -sitting relieves his pain -not doing heating pad -does not feel he can afford extracorporeal shockwave therapy -he cannot apply lidocaine patch very well, he has nurse to help.  -last time he worked with PT he felt that PT was dismissive and he was discharged due to lack of progressive.   2) Right elbow pain -fell on concrete. -Xrs ordered at [facility].    3) Lower extremity swelling: - limited in his walking due to his back pain -lasix seems to be ineffective- he has been taking '40mg'$    Last clinic visit 05/30/19.  Since that time, pt states he completed therapies. He is wearing his new prosthesis without issue. Denies falls, uses cane for safety. He complains of elbow pain and was evaluated.    Pain Inventory Average Pain 2 Pain Right Now 0 My pain is dull  In the last 24 hours, has pain interfered with  the following? General activity 7 Relation with others 10 Enjoyment of life 10 What TIME of day is your pain at its worst? night Sleep (in general) Fair  Pain is worse with: walking and standing Pain improves with: rest and medication Relief from Meds: 8  Mobility walk with assistance use a cane ability to climb steps?  yes do you drive?  yes  Function disabled: date disabled . retired I need assistance with the following:  bathing and household duties  Neuro/Psych trouble walking  Physicians involved in your care Any changes since last visit?  no   No family history on file. Social History   Socioeconomic History   Marital status: Single    Spouse name: Not on file   Number of children: Not on file   Years of education: Not on file   Highest education level: Not on file  Occupational History   Not on file  Tobacco Use   Smoking status: Never   Smokeless tobacco: Never  Vaping Use   Vaping Use: Never used  Substance and Sexual Activity   Alcohol use: No   Drug use: Never   Sexual activity: Not on file  Other Topics Concern   Not on file  Social History Narrative   Not on file   Social Determinants of Health   Financial Resource Strain: Not on file  Food Insecurity: Not on file  Transportation Needs: Not on file  Physical Activity: Not on file  Stress: Not on file  Social Connections: Not on file   Past Surgical History:  Procedure Laterality Date   AMPUTATION Right 03/31/2017   Procedure: RIGHT FOOT 1ST AND 2ND RAY AMPUTATION;  Surgeon: Newt Minion, MD;  Location: [place];  Service: Orthopedics;  Laterality: Right;   AMPUTATION Right 04/01/2017   Procedure: AMPUTATION BELOW KNEE;  Surgeon: Newt Minion, MD;  Location: [place];  Service: Orthopedics;  Laterality: Right;   COLONOSCOPY WITH PROPOFOL N/A 10/02/2017   Procedure: COLONOSCOPY WITH PROPOFOL;  Surgeon: [name], MD;  Location: [facility] ENDOSCOPY;  Service: Gastroenterology;  Laterality:  N/A;   COLONOSCOPY WITH PROPOFOL N/A 11/06/2020   Procedure: COLONOSCOPY WITH PROPOFOL;  Surgeon: [name], MD;  Location: [facility] ENDOSCOPY;  Service: Gastroenterology;  Laterality: N/A;   CORNEAL TRANSPLANT     EYE SURGERY     Past Medical History:  Diagnosis Date   Complication of anesthesia    COVID-29 Dec 2018   Diabetes mellitus without complication ([place])    Heart murmur    Hyperlipidemia    Hypertension    Obesity (BMI 30-39.9) 06/22/2020   Sleep apnea    There were no vitals taken for this visit.  Opioid Risk Score:   Fall Risk Score:  `1  Depression screen [facility] 2/9     10/01/2021   10:45 AM 10/12/2017    9:45 AM 08/10/2017   10:03 AM 06/26/2017   10:43 AM 05/11/2017    1:00 PM 03/29/2017    3:44 PM 11/22/2016   10:57 AM  Depression screen PHQ 2/9  Decreased Interest 0 0 0 0 0 2 0  Down, Depressed, Hopeless 0 0 0 0 0 0 0  PHQ - 2 Score 0 0 0 0 0 2 0  Altered sleeping    1 1 0 1  Tired, decreased energy    1 0 3 1  Change in appetite     0 1 0  Feeling bad or failure about yourself     0 0 0 0  Trouble concentrating    0 0 0 0  Moving slowly or fidgety/restless    0 0 1 1  Suicidal thoughts    0 0 0 0  PHQ-9 Score    '2 1 7 3   '$ Review of Systems  Constitutional: Negative.   HENT: Negative.    Eyes: Negative.   Respiratory: Negative.    Cardiovascular: Negative.   Gastrointestinal: Negative.   Endocrine: Negative.        High blood sugar  Genitourinary: Negative.   Musculoskeletal:  Positive for arthralgias, back pain and gait problem.  Skin: Negative.   Allergic/Immunologic: Negative.   Neurological:        Tingling   Hematological: Negative.   Psychiatric/Behavioral: Negative.    All other systems reviewed and are negative.     Objective:   Physical Exam  Not performed since patient was seen via phone    Assessment & Plan:  Right-handed male with history of diabetes mellitus, hypertension, legally blind, and CKD presents for follow-up up for  right BKA, vitamin D deficiency, and type 2 diabetes   1.  Right transtibial amputation              Continue HEP             Released by Ortho             New prosthesis obtained ~06/2018, good function after prosthesis  Will order [employer] for  more functional prosthetic given that mechanical imbalance could be contributing to his low back pain   2. Gait abnormality             Continue HEP             Cont cane for safety  3. B/l elbow pain  Refilled Volaren gel  4. Low back pain -Provided with a pain relief journal and discussed that it contains foods and lifestyle tips to naturally help to improve pain. Discussed that these lifestyle strategies are also very good for health unlike some medications which can have negative side effects. Discussed that the act of keeping a journal can be therapeutic and helpful to realize patterns what helps to trigger and alleviate pain.   -MRI lumbar spine reviewed and discussed with patient: shows lumbar spinal stenosis and facet arthropathy -discussed joining a gym -discussed that it would be great to join gym.    5. Leg swelling -discussed BNP is normal, decrease lasix to '20mg'$  and monitor for any change in swelling  6. Severe vitamin D deficiency -prescribed ergocalciferol 50,000U once per week for 7 weeks  7) CKD:  -discussed most recent kidney injury  8) Type 2 diabetes -discussed benefits of intermittent fasting -discussed risks and benefits of Trulicity  9 minutes spent in discussing his lumbar MRI results which show lumbar spinal stenosis and facet athropathy

## 2021-11-26 ENCOUNTER — Encounter: Payer: Self-pay | Admitting: Physical Medicine and Rehabilitation

## 2021-11-26 ENCOUNTER — Encounter (HOSPITAL_BASED_OUTPATIENT_CLINIC_OR_DEPARTMENT_OTHER): Payer: Medicare Other | Admitting: Physical Medicine and Rehabilitation

## 2021-11-26 VITALS — BP 133/75 | HR 61 | Ht 77.0 in | Wt 352.0 lb

## 2021-11-26 DIAGNOSIS — S88111S Complete traumatic amputation at level between knee and ankle, right lower leg, sequela: Secondary | ICD-10-CM | POA: Diagnosis present

## 2021-11-26 DIAGNOSIS — M48061 Spinal stenosis, lumbar region without neurogenic claudication: Secondary | ICD-10-CM | POA: Diagnosis present

## 2021-11-26 NOTE — Progress Notes (Signed)
Subjective:    Patient ID: Trevor [name], male    DOB: 12/07/1955, 66 y.o.   MRN: [id number]  HPI   Pain Inventory Average Pain 4 Pain Right Now 4 My pain is intermittent and dull  In the last 24 hours, has pain interfered with the following? General activity 4 Relation with others 0 Enjoyment of life 10 What TIME of day is your pain at its worst? daytime Sleep (in general) Fair  Pain is worse with: walking, standing, and some activites Pain improves with: rest and medication Relief from Meds: 9  History reviewed. No pertinent family history. Social History   Socioeconomic History  . Marital status: Single    Spouse name: Not on file  . Number of children: Not on file  . Years of education: Not on file  . Highest education level: Not on file  Occupational History  . Not on file  Tobacco Use  . Smoking status: Never  . Smokeless tobacco: Never  Vaping Use  . Vaping Use: Never used  Substance and Sexual Activity  . Alcohol use: No  . Drug use: Never  . Sexual activity: Not on file  Other Topics Concern  . Not on file  Social History Narrative  . Not on file   Social Determinants of Health   Financial Resource Strain: Not on file  Food Insecurity: Not on file  Transportation Needs: Not on file  Physical Activity: Not on file  Stress: Not on file  Social Connections: Not on file   Past Surgical History:  Procedure Laterality Date  . AMPUTATION Right 03/31/2017   Procedure: RIGHT FOOT 1ST AND 2ND RAY AMPUTATION;  Surgeon: Newt Minion, MD;  Location: [place];  Service: Orthopedics;  Laterality: Right;  . AMPUTATION Right 04/01/2017   Procedure: AMPUTATION BELOW KNEE;  Surgeon: Newt Minion, MD;  Location: [place];  Service: Orthopedics;  Laterality: Right;  . COLONOSCOPY WITH PROPOFOL N/A 10/02/2017   Procedure: COLONOSCOPY WITH PROPOFOL;  Surgeon: [name], MD;  Location: [facility] ENDOSCOPY;  Service: Gastroenterology;  Laterality: N/A;  . COLONOSCOPY  WITH PROPOFOL N/A 11/06/2020   Procedure: COLONOSCOPY WITH PROPOFOL;  Surgeon: [name], MD;  Location: [facility] ENDOSCOPY;  Service: Gastroenterology;  Laterality: N/A;  . CORNEAL TRANSPLANT    . EYE SURGERY     Past Surgical History:  Procedure Laterality Date  . AMPUTATION Right 03/31/2017   Procedure: RIGHT FOOT 1ST AND 2ND RAY AMPUTATION;  Surgeon: Newt Minion, MD;  Location: [place];  Service: Orthopedics;  Laterality: Right;  . AMPUTATION Right 04/01/2017   Procedure: AMPUTATION BELOW KNEE;  Surgeon: Newt Minion, MD;  Location: [place];  Service: Orthopedics;  Laterality: Right;  . COLONOSCOPY WITH PROPOFOL N/A 10/02/2017   Procedure: COLONOSCOPY WITH PROPOFOL;  Surgeon: [name], MD;  Location: [facility] ENDOSCOPY;  Service: Gastroenterology;  Laterality: N/A;  . COLONOSCOPY WITH PROPOFOL N/A 11/06/2020   Procedure: COLONOSCOPY WITH PROPOFOL;  Surgeon: [name], MD;  Location: [facility] ENDOSCOPY;  Service: Gastroenterology;  Laterality: N/A;  . CORNEAL TRANSPLANT    . EYE SURGERY     Past Medical History:  Diagnosis Date  . Complication of anesthesia   . COVID-29 Dec 2018  . Diabetes mellitus without complication ([place])   . Heart murmur   . Hyperlipidemia   . Hypertension   . Obesity (BMI 30-39.9) 06/22/2020  . Sleep apnea    Ht '6\' 5"'$  (1.956 m)   Wt (!) 352 lb (  159.7 kg)   BMI 41.74 kg/m   Opioid Risk Score:   Fall Risk Score:  `1  Depression screen [facility] 2/9     11/26/2021   11:07 AM 10/01/2021   10:45 AM 10/12/2017    9:45 AM 08/10/2017   10:03 AM 06/26/2017   10:43 AM 05/11/2017    1:00 PM 03/29/2017    3:44 PM  Depression screen PHQ 2/9  Decreased Interest 0 0 0 0 0 0 2  Down, Depressed, Hopeless 0 0 0 0 0 0 0  PHQ - 2 Score 0 0 0 0 0 0 2  Altered sleeping     1 1 0  Tired, decreased energy     1 0 3  Change in appetite      0 1  Feeling bad or failure about yourself      0 0 0  Trouble concentrating     0 0 0  Moving slowly or fidgety/restless     0 0 1   Suicidal thoughts     0 0 0  PHQ-9 Score     '2 1 7    '$ Review of Systems  Musculoskeletal:  Positive for back pain and gait problem.  All other systems reviewed and are negative.      Objective:   Physical Exam        Assessment & Plan:

## 2021-11-26 NOTE — Progress Notes (Signed)
Subjective:    Patient ID: Trevor [name], male    DOB: Sep 08, 1955, 66 y.o.   MRN: [id number]  Trevor [name] is a 66 year old man Right-handed male who presents for follow-up of diabetes mellitus, hypertension, legally blind, and CKD presents for f/u of right BKA,  low back pain, kidney injury, vitamin D deficiency, suboptimal magnesium levels, lower extremity swelling.  1) Low back pain Currently has pain in his lower back -he feels that this is what most limits his walking -he is not interested in injections -he feels that the lack of mobility in his prosthesis is what most contributes to his back pain.  He asks about the results of his most recent Lumbar MRI He asked whether a more advanced prosthesis would be him to better ambulate.  It hurts mostly when he is walking He gets winded standing and walking He finds the Tramadol helpful, takes intermediately, does not need a refill today -sitting relieves his pain -not doing heating pad -does not feel he can afford extracorporeal shockwave therapy -he cannot apply lidocaine patch very well, he has nurse to help.  -last time he worked with PT he felt that PT was dismissive and he was discharged due to lack of progressive.   2) Right elbow pain -fell on concrete. -Xrs ordered at [facility].    3) Lower extremity swelling: - limited in his walking due to his back pain -lasix seems to be ineffective- he has been taking '40mg'$    4) Right BKA -he received a call from [place] to receive a new prosthetic -he would like to do PT after getting his new prosthetics   Pain Inventory Average Pain 2 Pain Right Now 0 My pain is dull  In the last 24 hours, has pain interfered with the following? General activity 7 Relation with others 10 Enjoyment of life 10 What TIME of day is your pain at its worst? night Sleep (in general) Fair  Pain is worse with: walking and standing Pain improves with: rest and medication Relief from Meds:  8  Mobility walk with assistance use a cane ability to climb steps?  yes do you drive?  yes  Function disabled: date disabled . retired I need assistance with the following:  bathing and household duties  Neuro/Psych trouble walking  Physicians involved in your care Any changes since last visit?  no   No family history on file. Social History   Socioeconomic History   Marital status: Single    Spouse name: Not on file   Number of children: Not on file   Years of education: Not on file   Highest education level: Not on file  Occupational History   Not on file  Tobacco Use   Smoking status: Never   Smokeless tobacco: Never  Vaping Use   Vaping Use: Never used  Substance and Sexual Activity   Alcohol use: No   Drug use: Never   Sexual activity: Not on file  Other Topics Concern   Not on file  Social History Narrative   Not on file   Social Determinants of Health   Financial Resource Strain: Not on file  Food Insecurity: Not on file  Transportation Needs: Not on file  Physical Activity: Not on file  Stress: Not on file  Social Connections: Not on file   Past Surgical History:  Procedure Laterality Date   AMPUTATION Right 03/31/2017   Procedure: RIGHT FOOT 1ST AND 2ND RAY AMPUTATION;  Surgeon: Newt Minion,  MD;  Location: [place];  Service: Orthopedics;  Laterality: Right;   AMPUTATION Right 04/01/2017   Procedure: AMPUTATION BELOW KNEE;  Surgeon: Newt Minion, MD;  Location: [place];  Service: Orthopedics;  Laterality: Right;   COLONOSCOPY WITH PROPOFOL N/A 10/02/2017   Procedure: COLONOSCOPY WITH PROPOFOL;  Surgeon: [name], MD;  Location: [facility] ENDOSCOPY;  Service: Gastroenterology;  Laterality: N/A;   COLONOSCOPY WITH PROPOFOL N/A 11/06/2020   Procedure: COLONOSCOPY WITH PROPOFOL;  Surgeon: [name], MD;  Location: [facility] ENDOSCOPY;  Service: Gastroenterology;  Laterality: N/A;   CORNEAL TRANSPLANT     EYE SURGERY     Past Medical History:   Diagnosis Date   Complication of anesthesia    COVID-29 Dec 2018   Diabetes mellitus without complication ([facility])    Heart murmur    Hyperlipidemia    Hypertension    Obesity (BMI 30-39.9) 06/22/2020   Sleep apnea    Ht '6\' 5"'$  (1.956 m)   Wt (!) 352 lb (159.7 kg)   BMI 41.74 kg/m   Opioid Risk Score:   Fall Risk Score:  `1  Depression screen [facility] 2/9     10/01/2021   10:45 AM 10/12/2017    9:45 AM 08/10/2017   10:03 AM 06/26/2017   10:43 AM 05/11/2017    1:00 PM 03/29/2017    3:44 PM 11/22/2016   10:57 AM  Depression screen PHQ 2/9  Decreased Interest 0 0 0 0 0 2 0  Down, Depressed, Hopeless 0 0 0 0 0 0 0  PHQ - 2 Score 0 0 0 0 0 2 0  Altered sleeping    1 1 0 1  Tired, decreased energy    1 0 3 1  Change in appetite     0 1 0  Feeling bad or failure about yourself     0 0 0 0  Trouble concentrating    0 0 0 0  Moving slowly or fidgety/restless    0 0 1 1  Suicidal thoughts    0 0 0 0  PHQ-9 Score    '2 1 7 3   '$ Review of Systems  Constitutional: Negative.   HENT: Negative.    Eyes: Negative.   Respiratory: Negative.    Cardiovascular: Negative.   Gastrointestinal: Negative.   Endocrine: Negative.        High blood sugar  Genitourinary: Negative.   Musculoskeletal:  Positive for arthralgias, back pain and gait problem.  Skin: Negative.   Allergic/Immunologic: Negative.   Neurological:        Tingling   Hematological: Negative.   Psychiatric/Behavioral: Negative.    All other systems reviewed and are negative.     Objective:   Physical Exam  Gen: no distress, normal appearing HEENT: oral mucosa pink and moist, NCAT Cardio: Reg rate Chest: normal effort, normal rate of breathing Abd: soft, non-distended Ext: no edema Psych: pleasant, normal affect Skin: intact Neuro: Alert and oriented x3 Musculoskeletal: Right BKA, antalgic gait    Assessment & Plan:  Right-handed male with history of diabetes mellitus, hypertension, legally blind, and CKD presents  for follow-up up for right BKA, vitamin D deficiency, and type 2 diabetes   1.  Right transtibial amputation              Continue HEP             Released by Ortho             New  prosthesis obtained ~06/2018, good function after prosthesis  Will order [employer] for more functional prosthetic given that mechanical imbalance could be contributing to his low back pain.  Ordered PT for him to start after he gets his new prosthetic   2. Gait abnormality             Continue HEP             Cont cane for safety  3. B/l elbow pain  Refilled Volaren gel  4. Low back pain -Provided with a pain relief journal and discussed that it contains foods and lifestyle tips to naturally help to improve pain. Discussed that these lifestyle strategies are also very good for health unlike some medications which can have negative side effects. Discussed that the act of keeping a journal can be therapeutic and helpful to realize patterns what helps to trigger and alleviate pain.   -MRI lumbar spine reviewed and discussed with patient: shows lumbar spinal stenosis and facet arthropathy -discussed joining a gym -discussed that it would be great to join gym.  -encouraged use of heating pad to improve blood flow to back -discussed medial branch block but he would prefer to see if his pain improves after he gets a more functional prosthetic   5. Leg swelling -discussed BNP is normal, decrease lasix to '20mg'$  and monitor for any change in swelling  6. Severe vitamin D deficiency -continue ergocalciferol 50,000U once per week for 7 weeks  7) CKD:  -discussed most recent kidney injury  8) Type 2 diabetes -discussed benefits of intermittent fasting -discussed risks and benefits of Trulicity

## 2022-01-20 ENCOUNTER — Ambulatory Visit: Payer: Medicare Other | Admitting: Podiatry

## 2022-01-21 ENCOUNTER — Ambulatory Visit: Payer: Medicare Other | Admitting: Podiatry

## 2022-02-01 ENCOUNTER — Encounter: Payer: Self-pay | Admitting: Physical Medicine and Rehabilitation

## 2022-02-01 ENCOUNTER — Encounter: Payer: 59 | Attending: Physical Medicine and Rehabilitation | Admitting: Physical Medicine and Rehabilitation

## 2022-02-01 VITALS — BP 155/84 | HR 64 | Ht 77.0 in

## 2022-02-01 DIAGNOSIS — R269 Unspecified abnormalities of gait and mobility: Secondary | ICD-10-CM | POA: Insufficient documentation

## 2022-02-01 DIAGNOSIS — M5440 Lumbago with sciatica, unspecified side: Secondary | ICD-10-CM | POA: Insufficient documentation

## 2022-02-01 DIAGNOSIS — S88111S Complete traumatic amputation at level between knee and ankle, right lower leg, sequela: Secondary | ICD-10-CM | POA: Diagnosis present

## 2022-02-01 DIAGNOSIS — G8929 Other chronic pain: Secondary | ICD-10-CM | POA: Diagnosis not present

## 2022-02-01 NOTE — Progress Notes (Signed)
Subjective:    Patient ID: Trevor [name], male    DOB: 12-04-1955, 67 y.o.   MRN: [id number]  Trevor [name] is a 67 year old man Right-handed male who presents for follow-up of diabetes mellitus, hypertension, legally blind, and CKD presents for f/u of right BKA,  low back pain, kidney injury, vitamin D deficiency, suboptimal magnesium levels, lower extremity swelling.  1) Low back pain -resting helps the most -pain has been stable.  Currently has pain in his lower back -he feels that this is what most limits his walking -he is not interested in injections -he feels that the lack of mobility in his prosthesis is what most contributes to his back pain.  He asks about the results of his most recent Lumbar MRI He asked whether a more advanced prosthesis would be him to better ambulate.  It hurts mostly when he is walking He gets winded standing and walking He finds the Tramadol helpful, takes intermediately, does not need a refill today, he tries to limit use as is afraid of addiction, it does help when he takes it -sitting relieves his pain -not doing heating pad -does not feel he can afford extracorporeal shockwave therapy -he cannot apply lidocaine patch very well, he has nurse to help.  -last time he worked with PT he felt that PT was dismissive and he was discharged due to lack of progressive.  -joined the gym  2) Right elbow pain -fell on concrete. -Xrs ordered at [employer].    3) Lower extremity swelling: - limited in his walking due to his back pain -lasix seems to be ineffective- he has been taking '40mg'$    4) Right BKA -he received a call from [facility] to receive a new prosthetic -he would like to do PT after getting his new prosthetics -still swelling   Pain Inventory Average Pain 3 Pain Right Now 0 My pain is dull  In the last 24 hours, has pain interfered with the following? General activity 3 Relation with others 0 Enjoyment of life 0 What TIME of day is your  pain at its worst? daytime Sleep (in general) Fair  Pain is worse with: walking and standing Pain improves with: rest and medication Relief from Meds: 10     No family history on file. Social History   Socioeconomic History   Marital status: Single    Spouse name: Not on file   Number of children: Not on file   Years of education: Not on file   Highest education level: Not on file  Occupational History   Not on file  Tobacco Use   Smoking status: Never   Smokeless tobacco: Never  Vaping Use   Vaping Use: Never used  Substance and Sexual Activity   Alcohol use: No   Drug use: Never   Sexual activity: Not on file  Other Topics Concern   Not on file  Social History Narrative   Not on file   Social Determinants of Health   Financial Resource Strain: Not on file  Food Insecurity: Not on file  Transportation Needs: Not on file  Physical Activity: Not on file  Stress: Not on file  Social Connections: Not on file   Past Surgical History:  Procedure Laterality Date   AMPUTATION Right 03/31/2017   Procedure: RIGHT FOOT 1ST AND 2ND RAY AMPUTATION;  Surgeon: Newt Minion, MD;  Location: [place];  Service: Orthopedics;  Laterality: Right;   AMPUTATION Right 04/01/2017   Procedure: AMPUTATION BELOW  KNEE;  Surgeon: Newt Minion, MD;  Location: [place];  Service: Orthopedics;  Laterality: Right;   COLONOSCOPY WITH PROPOFOL N/A 10/02/2017   Procedure: COLONOSCOPY WITH PROPOFOL;  Surgeon: [name], MD;  Location: [facility] ENDOSCOPY;  Service: Gastroenterology;  Laterality: N/A;   COLONOSCOPY WITH PROPOFOL N/A 11/06/2020   Procedure: COLONOSCOPY WITH PROPOFOL;  Surgeon: [name], MD;  Location: [facility] ENDOSCOPY;  Service: Gastroenterology;  Laterality: N/A;   CORNEAL TRANSPLANT     EYE SURGERY     Past Medical History:  Diagnosis Date   Complication of anesthesia    COVID-29 Dec 2018   Diabetes mellitus without complication ([place])    Heart murmur    Hyperlipidemia     Hypertension    Obesity (BMI 30-39.9) 06/22/2020   Sleep apnea    There were no vitals taken for this visit.  Opioid Risk Score:   Fall Risk Score:  `1  Depression screen [facility] 2/9     11/26/2021   11:07 AM 10/01/2021   10:45 AM 10/12/2017    9:45 AM 08/10/2017   10:03 AM 06/26/2017   10:43 AM 05/11/2017    1:00 PM 03/29/2017    3:44 PM  Depression screen PHQ 2/9  Decreased Interest 0 0 0 0 0 0 2  Down, Depressed, Hopeless 0 0 0 0 0 0 0  PHQ - 2 Score 0 0 0 0 0 0 2  Altered sleeping     1 1 0  Tired, decreased energy     1 0 3  Change in appetite      0 1  Feeling bad or failure about yourself      0 0 0  Trouble concentrating     0 0 0  Moving slowly or fidgety/restless     0 0 1  Suicidal thoughts     0 0 0  PHQ-9 Score     '2 1 7   '$ Review of Systems  Constitutional: Negative.   HENT: Negative.    Eyes: Negative.   Respiratory: Negative.    Cardiovascular: Negative.   Gastrointestinal: Negative.   Endocrine: Negative.        High blood sugar  Genitourinary: Negative.   Musculoskeletal:  Positive for arthralgias, back pain and gait problem.  Skin: Negative.   Allergic/Immunologic: Negative.   Neurological:        Tingling   Hematological: Negative.   Psychiatric/Behavioral: Negative.    All other systems reviewed and are negative.     Objective:   Physical Exam  Gen: no distress, normal appearing HEENT: oral mucosa pink and moist, NCAT Cardio: Reg rate Chest: normal effort, normal rate of breathing Abd: soft, non-distended Ext: no edema Psych: pleasant, normal affect Skin: intact Neuro: Alert and oriented x3 Musculoskeletal: Right BKA, antalgic gait    Assessment & Plan:  Right-handed male with history of diabetes mellitus, hypertension, legally blind, and CKD presents for follow-up up for right BKA, vitamin D deficiency, and type 2 diabetes   1.  Right transtibial amputation             Continue HEP             Released by Ortho             New  prosthesis obtained ~06/2018, good function after prosthesis  Will order [employer] for more functional prosthetic given that mechanical imbalance could be contributing to his low back pain.  Ordered PT for him  to start after he gets his new prosthetic  Will sent script for water cover for his prosthesis so he can get into the pool   2. Gait abnormality             Continue HEP             Cont cane for safety  Continue WC PRN  3. B/l elbow pain  Refilled Volaren gel  4. Low back pain -Provided with a pain relief journal and discussed that it contains foods and lifestyle tips to naturally help to improve pain. Discussed that these lifestyle strategies are also very good for health unlike some medications which can have negative side effects. Discussed that the act of keeping a journal can be therapeutic and helpful to realize patterns what helps to trigger and alleviate pain.    -MRI lumbar spine reviewed and discussed with patient: shows lumbar spinal stenosis and facet arthropathy -discussed starting to go to the gym tomorrow.  -discussed benefits of going to the gym with someone.  -encouraged use of heating pad to improve blood flow to back -discussed medial branch block but he would prefer to see if his pain improves after he gets a more functional prosthetic   5. Leg swelling -discussed BNP is normal, decrease lasix to '20mg'$  and monitor for any change in swelling  6. Severe vitamin D deficiency -continue ergocalciferol 50,000U once per week for 7 weeks  7) CKD:  -discussed most recent kidney injury  8) Type 2 diabetes -discussed benefits of intermittent fasting -discussed risks and benefits of Trulicity

## 2022-02-01 NOTE — Patient Instructions (Signed)
Waterproof Prosthetic Leg Cover DryPro

## 2022-02-25 ENCOUNTER — Ambulatory Visit (INDEPENDENT_AMBULATORY_CARE_PROVIDER_SITE_OTHER): Payer: 59 | Admitting: Podiatry

## 2022-02-25 ENCOUNTER — Encounter: Payer: Self-pay | Admitting: Podiatry

## 2022-02-25 VITALS — BP 157/78 | HR 57

## 2022-02-25 DIAGNOSIS — B351 Tinea unguium: Secondary | ICD-10-CM

## 2022-02-25 DIAGNOSIS — E1142 Type 2 diabetes mellitus with diabetic polyneuropathy: Secondary | ICD-10-CM | POA: Diagnosis not present

## 2022-02-25 DIAGNOSIS — M79675 Pain in left toe(s): Secondary | ICD-10-CM

## 2022-02-25 NOTE — Progress Notes (Signed)
  Subjective:  Patient ID: Trevor [name], male    DOB: 1955/09/24,  MRN: [id number]  Chief Complaint  Patient presents with   Nail Problem    "Cut  my toenails."    67 y.o. male returns for the above complaint.  Patient presents with thickened elongated mycotic toenails of the left lower extremity x5.  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  Patient states that they have been painful when ambulating.  He is not able to debride down himself.  He is a type II diabetic with last A1c of 9.6.  He does not have any secondary complaints today.    Objective:   Vitals:   02/25/22 1411  BP: (!) 157/78  Pulse: (!) 57   Podiatric Exam: Vascular: dorsalis pedis and posterior tibial pulses are palpable left capillary return is immediate. Temperature gradient is WNL. Skin turgor WNL  Sensorium: Normal Semmes Weinstein monofilament test. Normal tactile sensation left Nail Exam: Pt has thick disfigured discolored nails with subungual debris noted bilateral entire nail hallux through fifth toenails Ulcer Exam: There is no evidence of ulcer or pre-ulcerative changes or infection. Orthopedic Exam: Muscle tone and strength are WNL. No limitations in general ROM. No crepitus or effusions noted. HAV   left.  Hammer toes 2-5   left.  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 No infection or ulcers.  Severe dryness noted to both lower extremity plantar  Assessment & Plan:  Patient was evaluated and treated and all questions answered.  Hammertoes second and third -I explained to patient the etiology of hammertoe measurement options were discussed.  I educated him on taking the pressure off of the hammertoe by getting wider shoes and making shoe gear modification.  Toe protectors were also dispensed to take the pressure off of the hammertoe.  Ultimately patient is not an ideal surgical candidate for hammertoe correction. -Toe  protectors were dispensed  Xerosis/fissuring plantar foot -I explained to the patient the etiology of xerosis and various treatment options were extensively discussed.  I explained to the patient the importance of maintaining moisturization of the skin with application of over-the-counter lotion such as Eucerin or Luciderm.  Given that patient has failed over-the-counter therapy I believe patient will benefit from ammonium lactate cream.  Ammonium lactate was sent to the pharmacy.   Swelling to the left lower extremity -Improving.  Onychomycosis with pain x5 -Nails palliatively debrided as below. -Educated on self-care -Penlac was also dispensed to help with topical application decrease the fungal growth.  I have asked him to apply twice a day for 6 months.  Patient states understanding  Procedure: Nail Debridement Rationale: pain  Type of Debridement: manual, sharp debridement. Instrumentation: Nail nipper, rotary burr. Number of Nails: 5   Procedures and Treatment: Consent by patient was obtained for treatment procedures. The patient understood the discussion of treatment and procedures well. All questions were answered thoroughly reviewed. Debridement of mycotic and hypertrophic toenails, 1 through 5 bilateral and clearing of subungual debris. No ulceration, no infection noted.  Return Visit-Office Procedure: Patient instructed to return to the office for a follow up visit 3 months for continued evaluation and treatment.  Boneta Lucks, DPM    No follow-ups on file.

## 2022-05-02 ENCOUNTER — Encounter: Payer: 59 | Attending: Physical Medicine and Rehabilitation | Admitting: Physical Medicine and Rehabilitation

## 2022-05-02 ENCOUNTER — Encounter: Payer: Self-pay | Admitting: Physical Medicine and Rehabilitation

## 2022-05-02 VITALS — BP 150/80 | HR 60 | Ht 77.0 in | Wt 351.0 lb

## 2022-05-02 DIAGNOSIS — S88111S Complete traumatic amputation at level between knee and ankle, right lower leg, sequela: Secondary | ICD-10-CM | POA: Diagnosis not present

## 2022-05-02 DIAGNOSIS — S88111D Complete traumatic amputation at level between knee and ankle, right lower leg, subsequent encounter: Secondary | ICD-10-CM | POA: Diagnosis not present

## 2022-05-02 DIAGNOSIS — I10 Essential (primary) hypertension: Secondary | ICD-10-CM | POA: Insufficient documentation

## 2022-05-02 NOTE — Progress Notes (Signed)
Subjective:    Patient ID: Trevor [name], male    DOB: 07-09-1955, 67 y.o.   MRN: [id number]  Trevor [name] is a 67 year old [name] Right-handed male who presents for follow-up of diabetes mellitus, hypertension, legally blind, and CKD presents for f/u of right BKA,  low back pain, kidney injury, vitamin D deficiency, suboptimal magnesium levels, lower extremity swelling.  1) Low back pain -resting helps the most -pain has been stable.  Currently has pain in his lower back -he feels that this is what most limits his walking -he is not interested in injections -he feels that the lack of mobility in his prosthesis is what most contributes to his back pain.  He asks about the results of his most recent Lumbar MRI He asked whether a more advanced prosthesis would be him to better ambulate.  It hurts mostly when he is walking He gets winded standing and walking He finds the Tramadol helpful, takes intermediately, does not need a refill today, he tries to limit use as is afraid of addiction, it does help when he takes it -sitting relieves his pain -not doing heating pad -does not feel he can afford extracorporeal shockwave therapy -he cannot apply lidocaine patch very well, he has nurse to help.  -last time he worked with PT he felt that PT was dismissive and he was discharged due to lack of progressive.  -joined the gym  2) Right elbow pain -fell on concrete. -Xrs ordered at [employer].    3) Lower extremity swelling: - limited in his walking due to his back pain -lasix seems to be ineffective- he has been taking 40mg    4) Right BKA -he received a call from [employer] to receive a new prosthetic -he would like to do PT after getting his new prosthetics -still swelling -asks about the prosthetic for use in the water   Pain Inventory Average Pain 4 Pain Right Now 2 My pain is dull  In the last 24 hours, has pain interfered with the following? General activity 7 Relation with others  8 Enjoyment of life 10 What TIME of day is your pain at its worst? evening Sleep (in general) Fair  Pain is worse with: walking and standing Pain improves with: rest and medication Relief from Meds: 10     No family history on file. Social History   Socioeconomic History   Marital status: Single    Spouse name: Not on file   Number of children: Not on file   Years of education: Not on file   Highest education level: Not on file  Occupational History   Not on file  Tobacco Use   Smoking status: Never   Smokeless tobacco: Never  Vaping Use   Vaping Use: Never used  Substance and Sexual Activity   Alcohol use: No   Drug use: Never   Sexual activity: Not on file  Other Topics Concern   Not on file  Social History Narrative   Not on file   Social Determinants of Health   Financial Resource Strain: Not on file  Food Insecurity: Not on file  Transportation Needs: Not on file  Physical Activity: Not on file  Stress: Not on file  Social Connections: Not on file   Past Surgical History:  Procedure Laterality Date   AMPUTATION Right 03/31/2017   Procedure: RIGHT FOOT 1ST AND 2ND RAY AMPUTATION;  Surgeon: Nadara Mustard, MD;  Location: [facility] OR;  Service: Orthopedics;  Laterality: Right;  AMPUTATION Right 04/01/2017   Procedure: AMPUTATION BELOW KNEE;  Surgeon: Nadara Mustard, MD;  Location: [facility] OR;  Service: Orthopedics;  Laterality: Right;   COLONOSCOPY WITH PROPOFOL N/A 10/02/2017   Procedure: COLONOSCOPY WITH PROPOFOL;  Surgeon: Wyline Mood, MD;  Location: [facility] ENDOSCOPY;  Service: Gastroenterology;  Laterality: N/A;   COLONOSCOPY WITH PROPOFOL N/A 11/06/2020   Procedure: COLONOSCOPY WITH PROPOFOL;  Surgeon: Wyline Mood, MD;  Location: [facility] ENDOSCOPY;  Service: Gastroenterology;  Laterality: N/A;   CORNEAL TRANSPLANT     EYE SURGERY     Past Medical History:  Diagnosis Date   Complication of anesthesia    COVID-29 Dec 2018   Diabetes mellitus without  complication ([facility])    Heart murmur    Hyperlipidemia    Hypertension    Obesity (BMI 30-39.9) 06/22/2020   Sleep apnea    BP (!) 150/80   Pulse 60   Ht  (1.956 m)   Wt (!) 351 lb (159.2 kg)   SpO2 96%   BMI 41.62 kg/m   Opioid Risk Score:   Fall Risk Score:  `1  Depression screen PHQ 2/9     05/02/2022    2:50 PM 02/01/2022    1:39 PM 11/26/2021   11:07 AM 10/01/2021   10:45 AM 10/12/2017    9:45 AM 08/10/2017   10:03 AM 06/26/2017   10:43 AM  Depression screen PHQ 2/9  Decreased Interest 0  0 0 0 0 0  Down, Depressed, Hopeless 0 0 0 0 0 0 0  PHQ - 2 Score 0 0 0 0 0 0 0  Altered sleeping       1  Tired, decreased energy       1  Feeling bad or failure about yourself        0  Trouble concentrating       0  Moving slowly or fidgety/restless       0  Suicidal thoughts       0  PHQ-9 Score       2   Review of Systems  Constitutional: Negative.   HENT: Negative.    Eyes: Negative.   Respiratory: Negative.    Cardiovascular: Negative.   Gastrointestinal: Negative.   Endocrine: Negative.        High blood sugar  Genitourinary: Negative.   Musculoskeletal:  Positive for arthralgias, back pain and gait problem.  Skin: Negative.   Allergic/Immunologic: Negative.   Neurological:        Tingling   Hematological: Negative.   Psychiatric/Behavioral: Negative.    All other systems reviewed and are negative.     Objective:   Physical Exam  Gen: no distress, normal appearing HEENT: oral mucosa pink and moist, NCAT Cardio: Reg rate Chest: normal effort, normal rate of breathing Abd: soft, non-distended Ext: no edema Psych: pleasant, normal affect Skin: intact Neuro: Alert and oriented x3 Musculoskeletal: Right BKA, antalgic gait, arrived in wheelchair    Assessment & Plan:  Right-handed male with history of diabetes mellitus, hypertension, legally blind, and CKD presents for follow-up up for right BKA, vitamin D deficiency, and type 2 diabetes   1.  Right  transtibial amputation             Continue HEP             Released by Ortho             New prosthesis obtained ~06/2018, good function after prosthesis  Will  order [employer] [facility] for more functional prosthetic given that mechanical imbalance could be contributing to his low back pain.  Ordered PT for him to start after he gets his new prosthetic  Will sent script for water cover for his prosthesis so he can get into the pool, called [employer] to see if this is something that could be covered for him   2. Gait abnormality             Continue HEP             Cont cane for safety  Continue WC PRN  3. B/l elbow pain  Refilled Volaren gel  4. Low back pain -Provided with a pain relief journal and discussed that it contains foods and lifestyle tips to naturally help to improve pain. Discussed that these lifestyle strategies are also very good for health unlike some medications which can have negative side effects. Discussed that the act of keeping a journal can be therapeutic and helpful to realize patterns what helps to trigger and alleviate pain.    -MRI lumbar spine reviewed and discussed with patient: shows lumbar spinal stenosis and facet arthropathy -discussed starting to go to the gym tomorrow.  -discussed benefits of going to the gym with someone.  -encouraged use of heating pad to improve blood flow to back -discussed medial branch block but he would prefer to see if his pain improves after he gets a more functional prosthetic   5. Leg swelling -discussed BNP is normal, decrease lasix to  and monitor for any change in swelling  6. Severe vitamin D deficiency -continue ergocalciferol 50,000U once per week for 7 weeks  7) CKD:  -discussed most recent kidney injury  8) Type 2 diabetes -discussed benefits of intermittent fasting -discussed risks and benefits of Trulicity  9) HTN: -BP is 150/80 today.  -Advised checking BP daily at home and logging results to bring into  follow-up appointment with PCP and myself. -Reviewed BP meds today.  -Advised regarding healthy foods that can help lower blood pressure and provided with a list: 1) citrus foods- high in vitamins and minerals 2) salmon and other fatty fish - reduces inflammation and oxylipins 3) swiss chard (leafy green)- high level of nitrates 4) pumpkin seeds- one of the best natural sources of magnesium 5) Beans and lentils- high in fiber, magnesium, and potassium 6) Berries- high in flavonoids 7) Amaranth (whole grain, can be cooked similarly to rice and oats)- high in magnesium and fiber 8) Pistachios- even more effective at reducing BP than other nuts 9) Carrots- high in phenolic compounds that relax blood vessels and reduce inflammation 10) Celery- contain phthalides that relax tissues of arterial walls 11) Tomatoes- can also improve cholesterol and reduce risk of heart disease 12) Broccoli- good source of magnesium, calcium, and potassium 13) Greek yogurt: high in potassium and calcium 14) Herbs and spices: Celery seed, cilantro, saffron, lemongrass, black cumin, ginseng, cinnamon, cardamom, sweet basil, and ginger 15) Chia and flax seeds- also help to lower cholesterol and blood sugar 16) Beets- high levels of nitrates that relax blood vessels  17) spinach and bananas- high in potassium  -Provided lise of supplements that can help with hypertension:  1) magnesium: one high quality brand is Bioptemizers since it contains all 7 types of magnesium, otherwise over the counter magnesium gluconate  is a good option 2) B vitamins 3) vitamin D 4) potassium 5) CoQ10 6) L-arginine 7) Vitamin C 8) Beetroot -Educated that goal BP is 120/80. -  Made goal to incorporate some of the above foods into diet.

## 2022-05-02 NOTE — Patient Instructions (Addendum)
Coconut oil Manuka honey  HTN: -Advised regarding healthy foods that can help lower blood pressure and provided with a list: 1) citrus foods- high in vitamins and minerals 2) salmon and other fatty fish - reduces inflammation and oxylipins 3) swiss chard (leafy green)- high level of nitrates 4) pumpkin seeds- one of the best natural sources of magnesium 5) Beans and lentils- high in fiber, magnesium, and potassium 6) Berries- high in flavonoids 7) Amaranth (whole grain, can be cooked similarly to rice and oats)- high in magnesium and fiber 8) Pistachios- even more effective at reducing BP than other nuts 9) Carrots- high in phenolic compounds that relax blood vessels and reduce inflammation 10) Celery- contain phthalides that relax tissues of arterial walls 11) Tomatoes- can also improve cholesterol and reduce risk of heart disease 12) Broccoli- good source of magnesium, calcium, and potassium 13) Greek yogurt: high in potassium and calcium 14) Herbs and spices: Celery seed, cilantro, saffron, lemongrass, black cumin, ginseng, cinnamon, cardamom, sweet basil, and ginger 15) Chia and flax seeds- also help to lower cholesterol and blood sugar 16) Beets- high levels of nitrates that relax blood vessels  17) spinach and bananas- high in potassium  -Provided lise of supplements that can help with hypertension:  1) magnesium: one high quality brand is Bioptemizers since it contains all 7 types of magnesium, otherwise over the counter magnesium gluconate  is a good option 2) B vitamins 3) vitamin D 4) potassium 5) CoQ10 6) L-arginine 7) Vitamin C 8) Beetroot -Educated that goal BP is 120/80. -Made goal to incorporate some of the above foods into diet.

## 2022-05-10 NOTE — Progress Notes (Signed)
[facility] [address] [place], [place] [address]  Pulmonary Sleep Medicine   Office Visit Note  Patient Name: Trevor [name] DOB: [birth year] MRN [id number]    Chief Complaint: Obstructive Sleep Apnea visit  Brief History:  Trevor [name] is seen today for a follow up visit for BiPAP@ IPAP max 22, EPAP min 8, PS 6 cmH2O. The patient has a 15 year history of sleep apnea. Patient is using PAP nightly.  The patient feels rested after sleeping with PAP.  The patient reports  benefiting from PAP use. Reported sleepiness is improved and the Epworth Sleepiness Score is 7 out of 24. The patient does not take naps. The patient complains of the following: none.  The compliance download shows  72% compliance with an average use time of 5 hours 35 minutes. The AHI is 3.9.  Patient reports he has limited use recently from sinus congestion, but he reports he is unable to wear a FF mask as suggested. The patient does not complain of limb movements disrupting sleep. The patient continues to require PAP therapy in order to eliminate sleep apnea.   ROS  General: (-) fever, (-) chills, (-) night sweat Nose and Sinuses: (-) nasal stuffiness or itchiness, (-) postnasal drip, (-) nosebleeds, (-) sinus trouble. Mouth and Throat: (-) sore throat, (-) hoarseness. Neck: (-) swollen glands, (-) enlarged thyroid, (-) neck pain. Respiratory: - cough, + shortness of breath, - wheezing. Neurologic: - numbness, - tingling. Psychiatric: - anxiety, - depression   Current Medication: Outpatient Encounter Medications as of 05/11/2022  Medication Sig Note   ACCU-CHEK AVIVA PLUS test strip USE AS DIRECTED TO CHECK BLOOD SUGAR THREE TIMES A DAY    acetaminophen (TYLENOL) 325 MG tablet Take 2 tablets (650 mg total) by mouth every 6 (six) hours as needed for mild pain (or Fever >/= 101).    ammonium lactate (AMLACTIN) 12 % lotion Apply 1 application topically as needed for dry skin.    aspirin EC 81 MG tablet  Take 81 mg by mouth daily.    atenolol (TENORMIN) 100 MG tablet Take 100 mg by mouth daily.     atorvastatin (LIPITOR) 40 MG tablet Take 40 mg by mouth daily.    BD INSULIN SYRINGE U/F 31G X 5/16" 1 ML MISC     ciclopirox (PENLAC) 8 % solution Apply topically at bedtime. Apply over nail and surrounding skin. Apply daily over previous coat. After seven (7) days, may remove with alcohol and continue cycle.    clobetasol cream (TEMOVATE) 0.05 % Apply topically 2 (two) times daily.    clotrimazole (LOTRIMIN) 1 % cream Apply topically.    diclofenac Sodium (VOLTAREN) 1 % GEL Apply 2 g topically 4 (four) times daily.    fluticasone (FLONASE) 50 MCG/ACT nasal spray Place 1 spray into both nostrils daily.    furosemide (LASIX) 40 MG tablet Take 40 mg by mouth daily.    glipiZIDE (GLUCOTROL) 10 MG tablet Take 10 mg by mouth 2 (two) times daily before a meal.    insulin aspart (NOVOLOG) 100 UNIT/ML FlexPen Inject 10 Units into the skin 3 (three) times daily with meals. 05/03/2018: As needed   insulin detemir (LEVEMIR) 100 unit/ml SOLN Inject 0.34 mLs (34 Units total) into the skin at bedtime.    insulin lispro (HUMALOG) 100 UNIT/ML injection     JARDIANCE 10 MG TABS tablet Take 10 mg by mouth daily.    lactulose (CHRONULAC) 10 GM/15ML solution Take 30 mLs (20 g total) by  mouth daily as needed for mild constipation.    LEVEMIR 100 UNIT/ML injection Inject into the skin.    losartan (COZAAR) 100 MG tablet Take 100 mg by mouth daily.    metFORMIN (GLUCOPHAGE) 1000 MG tablet Take 1,000 mg by mouth 2 (two) times daily.    mupirocin ointment (BACTROBAN) 2 %     NIFEdipine (PROCARDIA XL/NIFEDICAL XL) 60 MG 24 hr tablet     OneTouch Delica Lancets 30G MISC USE 1 LANCET AS DIRECTED THREE TIMES DAILY    polyethylene glycol (MIRALAX / GLYCOLAX) packet Take 17 g by mouth daily as needed for mild constipation.    polyethylene glycol-electrolytes (NULYTELY) 420 g solution Prepare according to package instructions.  Starting at 5:00 PM: Drink one 8 oz glass of mixture every 15 minutes until you finish half of the jug. Five hours prior to procedure, drink 8 oz glass of mixture every 15 minutes until it is all gone. Make sure you do not drink anything 4 hours prior to your procedure.    potassium chloride 20 MEQ TBCR Take 40 mEq by mouth 2 (two) times daily for 3 days.    potassium chloride SA (KLOR-CON M20) 20 MEQ tablet Take 1 tablet (20 mEq total) by mouth daily.    spironolactone (ALDACTONE) 25 MG tablet Take 25 mg by mouth daily.    traMADol (ULTRAM) 50 MG tablet Take 0.5-1 tablets (25-50 mg total) by mouth every 8 (eight) hours as needed for severe pain. 02/01/2022: Taken about 2 months ago. Not here for count.   TRULICITY 0.75 MG/0.5ML SOPN SMARTSIG:0.5 Milliliter(s) SUB-Q Once a Week    Vitamin D, Ergocalciferol, (DRISDOL) 1.25 MG (50000 UNIT) CAPS capsule Take 1 capsule (50,000 Units total) by mouth every 7 (seven) days.    No facility-administered encounter medications on file as of 05/11/2022.    Surgical History: Past Surgical History:  Procedure Laterality Date   AMPUTATION Right 03/31/2017   Procedure: RIGHT FOOT 1ST AND 2ND RAY AMPUTATION;  Surgeon: Nadara Mustard, MD;  Location: [facility] OR;  Service: Orthopedics;  Laterality: Right;   AMPUTATION Right 04/01/2017   Procedure: AMPUTATION BELOW KNEE;  Surgeon: Nadara Mustard, MD;  Location: [facility] OR;  Service: Orthopedics;  Laterality: Right;   COLONOSCOPY WITH PROPOFOL N/A 10/02/2017   Procedure: COLONOSCOPY WITH PROPOFOL;  Surgeon: Wyline Mood, MD;  Location: [facility] ENDOSCOPY;  Service: Gastroenterology;  Laterality: N/A;   COLONOSCOPY WITH PROPOFOL N/A 11/06/2020   Procedure: COLONOSCOPY WITH PROPOFOL;  Surgeon: Wyline Mood, MD;  Location: [facility] ENDOSCOPY;  Service: Gastroenterology;  Laterality: N/A;   CORNEAL TRANSPLANT     EYE SURGERY      Medical History: Past Medical History:  Diagnosis Date   Complication of anesthesia    COVID-29 Dec 2018   Diabetes mellitus without complication (HCC)    Heart murmur    Hyperlipidemia    Hypertension    Obesity (BMI 30-39.9) 06/22/2020   Sleep apnea     Family History: Non contributory to the present illness  Social History: Social History   Socioeconomic History   Marital status: Single    Spouse name: Not on file   Number of children: Not on file   Years of education: Not on file   Highest education level: Not on file  Occupational History   Not on file  Tobacco Use   Smoking status: Never   Smokeless tobacco: Never  Vaping Use   Vaping Use: Never used  Substance and Sexual  Activity   Alcohol use: No   Drug use: Never   Sexual activity: Not on file  Other Topics Concern   Not on file  Social History Narrative   Not on file   Social Determinants of Health   Financial Resource Strain: Not on file  Food Insecurity: Not on file  Transportation Needs: Not on file  Physical Activity: Not on file  Stress: Not on file  Social Connections: Not on file  Intimate Partner Violence: Not on file    Vital Signs: Blood pressure (!) 164/94, pulse (!) 55, resp. rate 20, height 6\' 4"  (1.93 m), weight (!) 357 lb (161.9 kg), SpO2 94 %. Body mass index is 43.46 kg/m.    Examination: General Appearance: The patient is well-developed, well-nourished, and in no distress. Neck Circumference: 48 cm Skin: Gross inspection of skin unremarkable. Head: normocephalic, no gross deformities. Eyes: no gross deformities noted. ENT: ears appear grossly normal Neurologic: Alert and oriented. No involuntary movements.  STOP BANG RISK ASSESSMENT S (snore) Have you been told that you snore?     NO   T (tired) Are you often tired, fatigued, or sleepy during the day?   YES  O (obstruction) Do you stop breathing, choke, or gasp during sleep? NO   P (pressure) Do you have or are you being treated for high blood pressure? YES   B (BMI) Is your body index greater than 35 kg/m? YES    A (age) Are you 66 years old or older? YES   N (neck) Do you have a neck circumference greater than 16 inches?   YES   G (gender) Are you a male? YES   TOTAL STOP/BANG "YES" ANSWERS 6       A STOP-Bang score of 2 or less is considered low risk, and a score of 5 or more is high risk for having either moderate or severe OSA. For people who score 3 or 4, doctors may need to perform further assessment to determine how likely they are to have OSA.         EPWORTH SLEEPINESS SCALE:  Scale:  (0)= no chance of dozing; (1)= slight chance of dozing; (2)= moderate chance of dozing; (3)= high chance of dozing  Chance  Situtation    Sitting and reading: 1    Watching TV: 2    Sitting Inactive in public: 0    As a passenger in car: 0      Lying down to rest: 3    Sitting and talking: 0    Sitting quielty after lunch: 1    In a car, stopped in traffic: 0   TOTAL SCORE:   7 out of 24    SLEEP STUDIES:  PSG (11/14/07)  AHI 77, min SPO2 85% TITRATION (12/12/07) CPAP at 12 cmh20 TITRATION (09/01/21) 2 week trial of auto BIPAP    CPAP COMPLIANCE DATA:  Date Range: 02/08/2022-05/08/2022  Average Daily Use: 5 hours 35 mintues  Median Use: 5 hours 30 minutes  Compliance for > 4 Hours: 72%  AHI: 3.9 respiratory events per hour  Days Used: 81/90 days  Mask Leak: 27  95th Percentile Pressure: 22/8, 6         LABS: No results found for this or any previous visit (from the past 2160 hour(s)).  Radiology: MR LUMBAR SPINE WO CONTRAST  Result Date: 11/15/2021 CLINICAL DATA:  Low back pain for over 6 weeks. Worsening over the past 6 months. EXAM: MRI LUMBAR SPINE WITHOUT  CONTRAST TECHNIQUE: Multiplanar, multisequence MR imaging of the lumbar spine was performed. No intravenous contrast was administered. COMPARISON:  None Available. FINDINGS: Segmentation:  Standard. Alignment:  Minimal retrolisthesis of L5 on S1. Vertebrae: No acute fracture, evidence of discitis, or  aggressive bone lesion. Conus medullaris and cauda equina: Conus extends to the L1 level. Conus and cauda equina appear normal. Paraspinal and other soft tissues: No acute paraspinal abnormality. Disc levels: Disc spaces: Degenerative disease with disc height loss throughout the lumbar spine most severe at L5-S1. T12-L1: No significant disc bulge. No neural foraminal stenosis. No central canal stenosis. Mild bilateral facet arthropathy. L1-L2: Large left lateral disc osteophyte complex. No foraminal or central canal stenosis. L2-L3: Broad-based disc bulge eccentric towards the left. Mild bilateral facet arthropathy. Moderate-severe spinal stenosis. Mild right and moderate left foraminal stenosis. L3-L4: Mild broad-based disc bulge. Mild left foraminal stenosis. No right foraminal stenosis. No spinal stenosis. L4-L5: Broad-based disc bulge. Mild bilateral facet arthropathy. Mild spinal stenosis. Bilateral lateral recess stenosis. Severe left foraminal stenosis. Mild right foraminal stenosis. L5-S1: Broad-based disc bulge with a broad central disc protrusion. Severe bilateral foraminal stenosis. Bilateral subarticular recess stenosis. Mild spinal stenosis. Mild bilateral facet arthropathy. IMPRESSION: 1. At L2-3 there is a broad-based disc bulge eccentric towards the left. Mild bilateral facet arthropathy. Moderate-severe spinal stenosis. Mild right and moderate left foraminal stenosis. 2. At L4-5 there is a broad-based disc bulge. Mild bilateral facet arthropathy. Mild spinal stenosis. Bilateral lateral recess stenosis. Severe left foraminal stenosis. Mild right foraminal stenosis. 3. At L5-S1 there is a broad-based disc bulge with a broad central disc protrusion. Severe bilateral foraminal stenosis. Bilateral subarticular recess stenosis. Mild spinal stenosis. Mild bilateral facet arthropathy. 4. No acute osseous injury of the lumbar spine. Electronically Signed   By: Elige Ko M.D.   On: 11/15/2021 09:53     No results found.  No results found.    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   CPAP use counseling 07/26/2021   Morbid obesity (HCC) 07/26/2021   OSA (obstructive sleep apnea) 06/22/2020   Obesity (BMI 30-39.9) 06/22/2020   Pain of both elbows 12/23/2019   Elevated lactic acid level    Diabetic ketoacidosis without coma associated with type 2 diabetes mellitus (HCC)    Cellulitis of left foot 12/17/2018   Pain due to onychomycosis of toenail of right foot 07/05/2018   Abnormality of gait 10/12/2017   Type 2 diabetes mellitus with hyperglycemia, with long-term current use of insulin (HCC)    Flatulence    Hypomagnesemia    Unilateral complete BKA, right, sequela (HCC)    Benign essential HTN    Hypoalbuminemia due to protein-calorie malnutrition (HCC)    S/P below knee amputation, right (HCC) 04/12/2017   Acute blood loss anemia    Other encephalopathy 04/08/2017   Encephalopathy 04/08/2017   Altered mental status 04/08/2017   Labile blood pressure    Labile blood glucose    Drug induced constipation    Stage 3 chronic kidney disease (HCC)    Bacteremia    S/P unilateral BKA (below knee amputation), right (HCC) 04/04/2017   PAD (peripheral artery disease) (HCC)    Type 2 diabetes mellitus with right diabetic foot ulcer (HCC)    Post-operative pain    Legally blind    Upper GI bleed    Streptococcal bacteremia 04/01/2017   Hypokalemia 03/30/2017   Uncontrolled type 2 diabetes mellitus with  hyperglycemia, with long-term current use of insulin (HCC) 03/30/2017   Type 2 diabetes mellitus with peripheral neuropathy (HCC) 03/30/2017   AKI (acute kidney injury) (HCC) 03/30/2017   CKD stage 3 due to type 2 diabetes mellitus (HCC) 03/30/2017   Sepsis (HCC) 03/30/2017   Heart murmur 11/22/2016   Hyperlipidemia associated with type 2 diabetes mellitus (HCC) 11/22/2016   Obstructive sleep apnea  syndrome 11/22/2016   Essential hypertension 12/17/2012      The patient does tolerate PAP and reports benefit from PAP use. The patient was reminded how to adjust mask fit and advised to change supplies regularly. The patient was also counselled on nightly use. The compliance is fair. The AHI is 3.9. Patient continues to require PAP to treat their apnea and is medically necessary.   1. Complex sleep apnea syndrome Continue nightly use  2. CPAP use counseling CPAP couseling-Discussed importance of adequate CPAP use as well as proper care and cleaning techniques of machine and all supplies.  3. Essential hypertension Continue current medication and f/u with PCP.  4. Morbid obesity with BMI of 40.0-44.9, adult (HCC) Obesity Counseling: Had a lengthy discussion regarding patients BMI and weight issues. Patient was instructed on portion control as well as increased activity. Also discussed caloric restrictions with trying to maintain intake less than 2000 Kcal. Discussions were made in accordance with the 5As of weight management. Simple actions such as not eating late and if able to, taking a walk is suggested.    General Counseling: I have discussed the findings of the evaluation and examination with Cory Munch.  I have also discussed any further diagnostic evaluation thatmay be needed or ordered today. Sayvon verbalizes understanding of the findings of todays visit. We also reviewed his medications today and discussed drug interactions and side effects including but not limited excessive drowsiness and altered mental states. We also discussed that there is always a risk not just to him but also people around him. he has been encouraged to call the office with any questions or concerns that should arise related to todays visit.  No orders of the defined types were placed in this encounter.       I have personally obtained a history, examined the patient, evaluated laboratory and imaging  results, formulated the assessment and plan and placed orders.  This patient was seen by Lynn Ito, PA-C in collaboration with Dr. Freda Munro as a part of collaborative care agreement.  Yevonne Pax, MD [facility] Diplomate ABMS Pulmonary Critical Care Medicine and Sleep Medicine

## 2022-05-11 ENCOUNTER — Ambulatory Visit (INDEPENDENT_AMBULATORY_CARE_PROVIDER_SITE_OTHER): Payer: 59 | Admitting: Internal Medicine

## 2022-05-11 VITALS — BP 164/94 | HR 55 | Resp 20 | Ht 76.0 in | Wt 357.0 lb

## 2022-05-11 DIAGNOSIS — I10 Essential (primary) hypertension: Secondary | ICD-10-CM

## 2022-05-11 DIAGNOSIS — Z7189 Other specified counseling: Secondary | ICD-10-CM

## 2022-05-11 DIAGNOSIS — G4731 Primary central sleep apnea: Secondary | ICD-10-CM

## 2022-05-11 DIAGNOSIS — Z6841 Body Mass Index (BMI) 40.0 and over, adult: Secondary | ICD-10-CM

## 2022-05-11 NOTE — Patient Instructions (Signed)

## 2022-05-26 ENCOUNTER — Ambulatory Visit (INDEPENDENT_AMBULATORY_CARE_PROVIDER_SITE_OTHER): Payer: 59 | Admitting: Podiatry

## 2022-05-26 DIAGNOSIS — B351 Tinea unguium: Secondary | ICD-10-CM

## 2022-05-26 DIAGNOSIS — M79675 Pain in left toe(s): Secondary | ICD-10-CM | POA: Diagnosis not present

## 2022-05-26 NOTE — Progress Notes (Signed)
  Subjective:  Patient ID: Trevor [name], male    DOB: 12/18/1955,  MRN: [id number]  Chief Complaint  Patient presents with   Nail Problem    "Cut  my toenails."    66 y.o. male returns for the above complaint.  Patient presents with thickened elongated mycotic toenails of the left lower extremity x5.  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  Patient states that they have been painful when ambulating.  He is not able to debride down himself.  He is a type II diabetic with last A1c of 9.6.  He does not have any secondary complaints today.    Objective:   Vitals:   02/25/22 1411  BP: (!) 157/78  Pulse: (!) 57   Podiatric Exam: Vascular: dorsalis pedis and posterior tibial pulses are palpable left capillary return is immediate. Temperature gradient is WNL. Skin turgor WNL  Sensorium: Normal Semmes Weinstein monofilament test. Normal tactile sensation left Nail Exam: Pt has thick disfigured discolored nails with subungual debris noted bilateral entire nail hallux through fifth toenails Ulcer Exam: There is no evidence of ulcer or pre-ulcerative changes or infection. Orthopedic Exam: Muscle tone and strength are WNL. No limitations in general ROM. No crepitus or effusions noted. HAV   left.  Hammer toes 2-5   left.  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 No infection or ulcers.  Severe dryness noted to both lower extremity plantar  Assessment & Plan:  Patient was evaluated and treated and all questions answered.  Hammertoes second and third -I explained to patient the etiology of hammertoe measurement options were discussed.  I educated him on taking the pressure off of the hammertoe by getting wider shoes and making shoe gear modification.  Toe protectors were also dispensed to take the pressure off of the hammertoe.  Ultimately patient is not an ideal surgical candidate for hammertoe correction. -Toe  protectors were dispensed  Xerosis/fissuring plantar foot -I explained to the patient the etiology of xerosis and various treatment options were extensively discussed.  I explained to the patient the importance of maintaining moisturization of the skin with application of over-the-counter lotion such as Eucerin or Luciderm.  Given that patient has failed over-the-counter therapy I believe patient will benefit from ammonium lactate cream.  Ammonium lactate was sent to the pharmacy.   Swelling to the left lower extremity -Improving.  Onychomycosis with pain x5 -Nails palliatively debrided as below. -Educated on self-care -Penlac was also dispensed to help with topical application decrease the fungal growth.  I have asked him to apply twice a day for 6 months.  Patient states understanding  Procedure: Nail Debridement Rationale: pain  Type of Debridement: manual, sharp debridement. Instrumentation: Nail nipper, rotary burr. Number of Nails: 5   Procedures and Treatment: Consent by patient was obtained for treatment procedures. The patient understood the discussion of treatment and procedures well. All questions were answered thoroughly reviewed. Debridement of mycotic and hypertrophic toenails, 1 through 5 bilateral and clearing of subungual debris. No ulceration, no infection noted.  Return Visit-Office Procedure: Patient instructed to return to the office for a follow up visit 3 months for continued evaluation and treatment.  Amie Cowens, DPM    No follow-ups on file. 

## 2022-06-13 ENCOUNTER — Encounter: Payer: 59 | Attending: Physical Medicine and Rehabilitation | Admitting: Physical Medicine and Rehabilitation

## 2022-06-13 DIAGNOSIS — S88111S Complete traumatic amputation at level between knee and ankle, right lower leg, sequela: Secondary | ICD-10-CM | POA: Insufficient documentation

## 2022-06-13 DIAGNOSIS — I10 Essential (primary) hypertension: Secondary | ICD-10-CM | POA: Insufficient documentation

## 2022-07-11 ENCOUNTER — Encounter: Payer: 59 | Admitting: Physical Medicine and Rehabilitation

## 2022-08-09 ENCOUNTER — Encounter: Payer: 59 | Attending: Physical Medicine and Rehabilitation | Admitting: Physical Medicine and Rehabilitation

## 2022-08-09 ENCOUNTER — Encounter: Payer: Self-pay | Admitting: Physical Medicine and Rehabilitation

## 2022-08-09 VITALS — BP 141/79 | HR 59 | Ht 76.0 in | Wt 354.0 lb

## 2022-08-09 DIAGNOSIS — S88111S Complete traumatic amputation at level between knee and ankle, right lower leg, sequela: Secondary | ICD-10-CM

## 2022-08-09 DIAGNOSIS — M5440 Lumbago with sciatica, unspecified side: Secondary | ICD-10-CM

## 2022-08-09 DIAGNOSIS — Z89511 Acquired absence of right leg below knee: Secondary | ICD-10-CM | POA: Insufficient documentation

## 2022-08-09 DIAGNOSIS — I129 Hypertensive chronic kidney disease with stage 1 through stage 4 chronic kidney disease, or unspecified chronic kidney disease: Secondary | ICD-10-CM | POA: Diagnosis not present

## 2022-08-09 DIAGNOSIS — M25521 Pain in right elbow: Secondary | ICD-10-CM | POA: Diagnosis not present

## 2022-08-09 DIAGNOSIS — E1122 Type 2 diabetes mellitus with diabetic chronic kidney disease: Secondary | ICD-10-CM | POA: Insufficient documentation

## 2022-08-09 DIAGNOSIS — W19XXXD Unspecified fall, subsequent encounter: Secondary | ICD-10-CM | POA: Diagnosis not present

## 2022-08-09 DIAGNOSIS — G8929 Other chronic pain: Secondary | ICD-10-CM | POA: Insufficient documentation

## 2022-08-09 DIAGNOSIS — W19XXXS Unspecified fall, sequela: Secondary | ICD-10-CM

## 2022-08-09 DIAGNOSIS — M5459 Other low back pain: Secondary | ICD-10-CM | POA: Insufficient documentation

## 2022-08-09 DIAGNOSIS — R2689 Other abnormalities of gait and mobility: Secondary | ICD-10-CM | POA: Diagnosis not present

## 2022-08-09 DIAGNOSIS — N189 Chronic kidney disease, unspecified: Secondary | ICD-10-CM | POA: Diagnosis not present

## 2022-08-09 NOTE — Patient Instructions (Signed)
[phone] 

## 2022-08-09 NOTE — Progress Notes (Signed)
   Subjective:    Patient ID: Trevor [name], male    DOB: 01-26-1955, 67 y.o.   MRN: [id number]  HPI    Review of Systems     Objective:   Physical Exam        Assessment & Plan:

## 2022-08-09 NOTE — Progress Notes (Signed)
Subjective:    Patient ID: Trevor [name], male    DOB: 1955/08/10, 67 y.o.   MRN: [id number]  Trevor [name] is a 67 year old [name] right-handed male who presents for follow-up of diabetes mellitus, hypertension, legally blind, and CKD presents for f/u of right BKA,  low back pain, kidney injury, vitamin D deficiency, suboptimal magnesium levels, lower extremity swelling.  1) Low back pain -worst when standing -Interested in tens unit -resting helps the most -pain has been stable.  Currently has pain in his lower back -he feels that this is what most limits his walking -he is not interested in injections -he feels that the lack of mobility in his prosthesis is what most contributes to his back pain.  He asks about the results of his most recent Lumbar MRI He asked whether a more advanced prosthesis would be him to better ambulate.  It hurts mostly when he is walking He gets winded standing and walking He finds the Tramadol helpful, takes intermediately, does not need a refill today, he tries to limit use as is afraid of addiction, it does help when he takes it -sitting relieves his pain -not doing heating pad -does not feel he can afford extracorporeal shockwave therapy -he cannot apply lidocaine patch very well, he has nurse to help.  -last time he worked with PT he felt that PT was dismissive and he was discharged due to lack of progressive.  -joined the gym  2) Right elbow pain -fell on concrete. -Xrs ordered at [facility].    3) Lower extremity swelling: - limited in his walking due to his back pain -lasix seems to be ineffective- he has been taking 40mg    4) Right BKA -he received a call from [employer] to receive a new prosthetic -he would like to do PT after getting his new prosthetics -still swelling -asks about the prosthetic for use in the water  5) Fall -he had no lasting deficits but was embarrassed -he hurt his elbow and there was a lesion   Pain  Inventory Average Pain 3 Pain Right Now 2 My pain is dull  In the last 24 hours, has pain interfered with the following? General activity 1 Relation with others 0 Enjoyment of life 0 What TIME of day is your pain at its worst? Morning daytime evening Sleep (in general) Fair  Pain is worse with: walking and standing some activities Pain improves with: rest and medication Relief from Meds: 10     No family history on file. Social History   Socioeconomic History   Marital status: Single    Spouse name: Not on file   Number of children: Not on file   Years of education: Not on file   Highest education level: Not on file  Occupational History   Not on file  Tobacco Use   Smoking status: Never   Smokeless tobacco: Never  Vaping Use   Vaping status: Never Used  Substance and Sexual Activity   Alcohol use: No   Drug use: Never   Sexual activity: Not on file  Other Topics Concern   Not on file  Social History Narrative   Not on file   Social Determinants of Health   Financial Resource Strain: Not on file  Food Insecurity: Not on file  Transportation Needs: Not on file  Physical Activity: Not on file  Stress: Not on file  Social Connections: Not on file   Past Surgical History:  Procedure Laterality Date  AMPUTATION Right 03/31/2017   Procedure: RIGHT FOOT 1ST AND 2ND RAY AMPUTATION;  Surgeon: Nadara Mustard, MD;  Location: [facility] OR;  Service: Orthopedics;  Laterality: Right;   AMPUTATION Right 04/01/2017   Procedure: AMPUTATION BELOW KNEE;  Surgeon: Nadara Mustard, MD;  Location: [facility] OR;  Service: Orthopedics;  Laterality: Right;   COLONOSCOPY WITH PROPOFOL N/A 10/02/2017   Procedure: COLONOSCOPY WITH PROPOFOL;  Surgeon: Wyline Mood, MD;  Location: [facility] ENDOSCOPY;  Service: Gastroenterology;  Laterality: N/A;   COLONOSCOPY WITH PROPOFOL N/A 11/06/2020   Procedure: COLONOSCOPY WITH PROPOFOL;  Surgeon: Wyline Mood, MD;  Location: [facility] ENDOSCOPY;  Service:  Gastroenterology;  Laterality: N/A;   CORNEAL TRANSPLANT     EYE SURGERY     Past Medical History:  Diagnosis Date   Complication of anesthesia    COVID-29 Dec 2018   Diabetes mellitus without complication (HCC)    Heart murmur    Hyperlipidemia    Hypertension    Obesity (BMI 30-39.9) 06/22/2020   Sleep apnea    Ht 6\' 4"  (1.93 m)   Wt (!) 354 lb (160.6 kg)   BMI 43.09 kg/m   Opioid Risk Score:   Fall Risk Score:  `1  Depression screen PHQ 2/9     08/09/2022    1:11 PM 05/02/2022    2:50 PM 02/01/2022    1:39 PM 11/26/2021   11:07 AM 10/01/2021   10:45 AM 10/12/2017    9:45 AM 08/10/2017   10:03 AM  Depression screen PHQ 2/9  Decreased Interest 0 0  0 0 0 0  Down, Depressed, Hopeless 0 0 0 0 0 0 0  PHQ - 2 Score 0 0 0 0 0 0 0   Review of Systems  Constitutional: Negative.   HENT: Negative.    Eyes: Negative.   Respiratory: Negative.    Cardiovascular: Negative.   Gastrointestinal: Negative.   Endocrine: Negative.        High blood sugar  Genitourinary: Negative.   Musculoskeletal:  Positive for arthralgias, back pain and gait problem.       RT arm  Skin: Negative.   Allergic/Immunologic: Negative.   Neurological:        Tingling   Hematological: Negative.   Psychiatric/Behavioral: Negative.    All other systems reviewed and are negative.     Objective:   Physical Exam  Gen: no distress, normal appearing, weight is 354 lbs HEENT: oral mucosa pink and moist, NCAT Cardio: Reg rate Chest: normal effort, normal rate of breathing Abd: soft, non-distended Ext: no edema Psych: pleasant, normal affect Skin: intact Neuro:  oriented x3 Musculoskeletal: Right BKA, antalgic gait, arrived in wheelchair    Assessment & Plan:  Right-handed male with history of diabetes mellitus, hypertension, legally blind, and CKD presents for follow-up up for right BKA, vitamin D deficiency, and type 2 diabetes   1.  Right transtibial amputation            Continue HEP              Released by Ortho             New prosthesis obtained ~06/2018, good function after prosthesis  Will order [employer] Eval for more functional prosthetic given that mechanical imbalance could be contributing to his low back pain.  Ordered PT for him to start after he gets his new prosthetic  Will sent script for water cover for his prosthesis so he can get into the pool,  called [employer] to see if this is something that could be covered for him   2. Gait abnormality             Continue HEP             Cont cane for safety  Continue WC PRN  3. B/l elbow pain  Refilled Volaren gel  4. Low back pain -continue aspirin -Provided with a pain relief journal and discussed that it contains foods and lifestyle tips to naturally help to improve pain. Discussed that these lifestyle strategies are also very good for health unlike some medications which can have negative side effects. Discussed that the act of keeping a journal can be therapeutic and helpful to realize patterns what helps to trigger and alleviate pain.    -MRI lumbar spine reviewed and discussed with patient: shows lumbar spinal stenosis and facet arthropathy -discussed starting to go to the gym tomorrow.  -discussed benefits of going to the gym with someone.  -encouraged use of heating pad to improve blood flow to back -discussed medial branch block but he would prefer to see if his pain improves after he gets a more functional prosthetic -Prescribed Zynex Nexwave   5. Leg swelling -discussed BNP is normal, decrease lasix to 20mg  and monitor for any change in swelling  6. Severe vitamin D deficiency -continue ergocalciferol 50,000U once per week for 7 weeks  7) CKD:  -discussed most recent kidney injury  8) Type 2 diabetes -discussed benefits of intermittent fasting -discussed risks and benefits of Trulicity  9) HTN: -BP is 141/79 today.  HTN: -BP is 141/79 today.  -Advised checking BP daily at home and logging results  to bring into follow-up appointment with PCP and myself. -Reviewed BP meds today.  -Advised regarding healthy foods that can help lower blood pressure and provided with a list: 1) citrus foods- high in vitamins and minerals 2) salmon and other fatty fish - reduces inflammation and oxylipins 3) swiss chard (leafy green)- high level of nitrates 4) pumpkin seeds- one of the best natural sources of magnesium 5) Beans and lentils- high in fiber, magnesium, and potassium 6) Berries- high in flavonoids 7) Amaranth (whole grain, can be cooked similarly to rice and oats)- high in magnesium and fiber 8) Pistachios- even more effective at reducing BP than other nuts 9) Carrots- high in phenolic compounds that relax blood vessels and reduce inflammation 10) Celery- contain phthalides that relax tissues of arterial walls 11) Tomatoes- can also improve cholesterol and reduce risk of heart disease 12) Broccoli- good source of magnesium, calcium, and potassium 13) Greek yogurt: high in potassium and calcium 14) Herbs and spices: Celery seed, cilantro, saffron, lemongrass, black cumin, ginseng, cinnamon, cardamom, sweet basil, and ginger 15) Chia and flax seeds- also help to lower cholesterol and blood sugar 16) Beets- high levels of nitrates that relax blood vessels  17) spinach and bananas- high in potassium  -Provided lise of supplements that can help with hypertension:  1) magnesium: one high quality brand is Bioptemizers since it contains all 7 types of magnesium, otherwise over the counter magnesium gluconate 400mg  is a good option 2) B vitamins 3) vitamin D 4) potassium 5) CoQ10 6) L-arginine 7) Vitamin C 8) Beetroot -Educated that goal BP is 120/80. -Made goal to incorporate some of the above foods into diet.

## 2022-08-25 ENCOUNTER — Ambulatory Visit: Payer: 59 | Admitting: Podiatry

## 2022-08-30 ENCOUNTER — Ambulatory Visit (INDEPENDENT_AMBULATORY_CARE_PROVIDER_SITE_OTHER): Payer: 59 | Admitting: Podiatry

## 2022-08-30 DIAGNOSIS — B351 Tinea unguium: Secondary | ICD-10-CM

## 2022-08-30 DIAGNOSIS — M79675 Pain in left toe(s): Secondary | ICD-10-CM

## 2022-08-30 NOTE — Progress Notes (Signed)
  Subjective:  Patient ID: Trevor [name], male    DOB: Jun 15, 1955,  MRN: [id number]  Chief Complaint  Patient presents with   Nail Problem    Nail trim    67 y.o. male returns for the above complaint.  Patient presents with thickened elongated mycotic toenails of the left lower extremity x5.  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  Patient states that they have been painful when ambulating.  He is not able to debride down himself.  He is a type II diabetic with last A1c of 9.6.  He does not have any secondary complaints today.    Objective:   There were no vitals filed for this visit.  Podiatric Exam: Vascular: dorsalis pedis and posterior tibial pulses are palpable left capillary return is immediate. Temperature gradient is WNL. Skin turgor WNL  Sensorium: Normal Semmes Weinstein monofilament test. Normal tactile sensation left Nail Exam: Pt has thick disfigured discolored nails with subungual debris noted bilateral entire nail hallux through fifth toenails Ulcer Exam: There is no evidence of ulcer or pre-ulcerative changes or infection. Orthopedic Exam: Muscle tone and strength are WNL. No limitations in general ROM. No crepitus or effusions noted. HAV   left.  Hammer toes 2-5   left.  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 No infection or ulcers.  Severe dryness noted to both lower extremity plantar  Assessment & Plan:  Patient was evaluated and treated and all questions answered.  Hammertoes second and third -I explained to patient the etiology of hammertoe measurement options were discussed.  I educated him on taking the pressure off of the hammertoe by getting wider shoes and making shoe gear modification.  Toe protectors were also dispensed to take the pressure off of the hammertoe.  Ultimately patient is not an ideal surgical candidate for hammertoe correction. -Toe protectors were  dispensed  Xerosis/fissuring plantar foot -I explained to the patient the etiology of xerosis and various treatment options were extensively discussed.  I explained to the patient the importance of maintaining moisturization of the skin with application of over-the-counter lotion such as Eucerin or Luciderm.  Given that patient has failed over-the-counter therapy I believe patient will benefit from ammonium lactate cream.  Ammonium lactate was sent to the pharmacy.   Swelling to the left lower extremity -Improving.  Onychomycosis with pain x5 -Nails palliatively debrided as below. -Educated on self-care -Penlac was also dispensed to help with topical application decrease the fungal growth.  I have asked him to apply twice a day for 6 months.  Patient states understanding  Procedure: Nail Debridement Rationale: pain  Type of Debridement: manual, sharp debridement. Instrumentation: Nail nipper, rotary burr. Number of Nails: 5   Procedures and Treatment: Consent by patient was obtained for treatment procedures. The patient understood the discussion of treatment and procedures well. All questions were answered thoroughly reviewed. Debridement of mycotic and hypertrophic toenails, 1 through 5 bilateral and clearing of subungual debris. No ulceration, no infection noted.  Return Visit-Office Procedure: Patient instructed to return to the office for a follow up visit 3 months for continued evaluation and treatment.  Nicholes Rough, DPM    No follow-ups on file.

## 2022-11-14 ENCOUNTER — Encounter: Payer: 59 | Attending: Physical Medicine and Rehabilitation | Admitting: Physical Medicine and Rehabilitation

## 2022-11-14 DIAGNOSIS — I10 Essential (primary) hypertension: Secondary | ICD-10-CM | POA: Insufficient documentation

## 2022-11-14 DIAGNOSIS — M5440 Lumbago with sciatica, unspecified side: Secondary | ICD-10-CM | POA: Diagnosis not present

## 2022-11-14 DIAGNOSIS — Z89511 Acquired absence of right leg below knee: Secondary | ICD-10-CM | POA: Insufficient documentation

## 2022-11-14 DIAGNOSIS — G8929 Other chronic pain: Secondary | ICD-10-CM | POA: Diagnosis not present

## 2022-11-14 NOTE — Progress Notes (Signed)
Subjective:    Patient ID: Trevor [name], male    DOB: 06/15/1955, 67 y.o.   MRN: [id number]  Trevor [name] is a 67 year old [name] right-handed male who presents for follow-up of diabetes mellitus, hypertension, legally blind, and CKD presents for f/u of right BKA,  low back pain, kidney injury, vitamin D deficiency, suboptimal magnesium levels, lower extremity swelling.  1) Low back pain -worst when standing -pain has been stable -Interested in tens unit, but he did not get it as it he has no one to put it on for him -resting helps the most -pain has been stable.  Currently has pain in his lower back -he feels that this is what most limits his walking -he is not interested in injections -he feels that the lack of mobility in his prosthesis is what most contributes to his back pain.  He asks about the results of his most recent Lumbar MRI He asked whether a more advanced prosthesis would be him to better ambulate.  It hurts mostly when he is walking He gets winded standing and walking He finds the Tramadol helpful, takes intermediately, does not need a refill today, he tries to limit use as is afraid of addiction, it does help when he takes it -sitting relieves his pain -not doing heating pad -does not feel he can afford extracorporeal shockwave therapy -he cannot apply lidocaine patch very well, he has nurse to help.  -last time he worked with PT he felt that PT was dismissive and he was discharged due to lack of progressive.  -joined the gym  2) Right elbow pain -fell on concrete. -Xrs ordered at [facility] [employer].    3) Lower extremity swelling: - limited in his walking due to his back pain -lasix seems to be ineffective- he has been taking 40mg    4) Right BKA -he received a call from [employer] to receive a new prosthetic -he would like to do PT after getting his new prosthetics -still swelling -asks about the prosthetic for use in the water -his prosthesis is uneven, he believes  this is contributing to his back pain  5) Fall -he had no lasting deficits but was embarrassed -he hurt his elbow and there was a lesion   Pain Inventory Average Pain 3 Pain Right Now 2 My pain is dull  In the last 24 hours, has pain interfered with the following? General activity 6 Relation with others 10 Enjoyment of life 10 What TIME of day is your pain at its worst? Morning daytime evening Sleep (in general) Fair  Pain is worse with: walking and standing some activities Pain improves with: rest and medication Relief from Meds: 10     No family history on file. Social History   Socioeconomic History   Marital status: Single    Spouse name: Not on file   Number of children: Not on file   Years of education: Not on file   Highest education level: Not on file  Occupational History   Not on file  Tobacco Use   Smoking status: Never   Smokeless tobacco: Never  Vaping Use   Vaping status: Never Used  Substance and Sexual Activity   Alcohol use: No   Drug use: Never   Sexual activity: Not on file  Other Topics Concern   Not on file  Social History Narrative   Not on file   Social Determinants of Health   Financial Resource Strain: Not on file  Food Insecurity: Not  on file  Transportation Needs: Not on file  Physical Activity: Not on file  Stress: Not on file  Social Connections: Not on file   Past Surgical History:  Procedure Laterality Date   AMPUTATION Right 03/31/2017   Procedure: RIGHT FOOT 1ST AND 2ND RAY AMPUTATION;  Surgeon: Nadara Mustard, MD;  Location: [facility] OR;  Service: Orthopedics;  Laterality: Right;   AMPUTATION Right 04/01/2017   Procedure: AMPUTATION BELOW KNEE;  Surgeon: Nadara Mustard, MD;  Location: [facility] OR;  Service: Orthopedics;  Laterality: Right;   COLONOSCOPY WITH PROPOFOL N/A 10/02/2017   Procedure: COLONOSCOPY WITH PROPOFOL;  Surgeon: Wyline Mood, MD;  Location: [facility] ENDOSCOPY;  Service: Gastroenterology;  Laterality: N/A;    COLONOSCOPY WITH PROPOFOL N/A 11/06/2020   Procedure: COLONOSCOPY WITH PROPOFOL;  Surgeon: Wyline Mood, MD;  Location: [facility] ENDOSCOPY;  Service: Gastroenterology;  Laterality: N/A;   CORNEAL TRANSPLANT     EYE SURGERY     Past Medical History:  Diagnosis Date   Complication of anesthesia    COVID-29 Dec 2018   Diabetes mellitus without complication (HCC)    Heart murmur    Hyperlipidemia    Hypertension    Obesity (BMI 30-39.9) 06/22/2020   Sleep apnea    There were no vitals taken for this visit.  Opioid Risk Score:   Fall Risk Score:  `1  Depression screen PHQ 2/9     11/14/2022    2:48 PM 08/09/2022    1:11 PM 05/02/2022    2:50 PM 02/01/2022    1:39 PM 11/26/2021   11:07 AM 10/01/2021   10:45 AM 10/12/2017    9:45 AM  Depression screen PHQ 2/9  Decreased Interest 0 0 0  0 0 0  Down, Depressed, Hopeless 0 0 0 0 0 0 0  PHQ - 2 Score 0 0 0 0 0 0 0   Review of Systems  Constitutional: Negative.   HENT: Negative.    Eyes: Negative.   Respiratory: Negative.    Cardiovascular: Negative.   Gastrointestinal: Negative.   Endocrine: Negative.        High blood sugar  Genitourinary: Negative.   Musculoskeletal:  Positive for arthralgias, back pain and gait problem.       RT arm  Skin: Negative.   Allergic/Immunologic: Negative.   Neurological:        Tingling   Hematological: Negative.   Psychiatric/Behavioral: Negative.    All other systems reviewed and are negative.     Objective:   Physical Exam  Gen: no distress, normal appearing, weight is 354 lbs HEENT: oral mucosa pink and moist, NCAT Cardio: Reg rate Chest: normal effort, normal rate of breathing Abd: soft, non-distended Ext: no edema Psych: pleasant, normal affect Skin: intact Neuro:  oriented x3 Musculoskeletal: Right BKA, antalgic gait, ambulating with cane    Assessment & Plan:  Right-handed male with history of diabetes mellitus, hypertension, legally blind, and CKD presents for follow-up up  for right BKA, vitamin D deficiency, and type 2 diabetes   1.  Right transtibial amputation            Continue HEP             Released by Ortho             New prosthesis obtained ~06/2018, good function after prosthesis  Will order [employer] Eval for more functional prosthetic given that mechanical imbalance could be contributing to his low back pain.  Ordered PT for  him to start after he gets his new prosthetic  Will sent script for water cover for his prosthesis so he can get into the pool, called [employer] to see if this is something that could be covered for him  Called [employer] to set hip up for an appointment for readjustment of his prosthesis.    2. Gait abnormality             Continue HEP             Cont cane for safety  Continue WC PRN  3. B/l elbow pain  Refilled Volaren gel  4. Low back pain -Discussed current symptoms of pain and history of pain.  -Discussed benefits of exercise in reducing pain. -Discussed following foods that may reduce pain: 1) Ginger (especially studied for arthritis)- reduce leukotriene production to decrease inflammation 2) Blueberries- high in phytonutrients that decrease inflammation 3) Salmon- marine omega-3s reduce joint swelling and pain 4) Pumpkin seeds- reduce inflammation 5) dark chocolate- reduces inflammation 6) turmeric- reduces inflammation 7) tart cherries - reduce pain and stiffness 8) extra virgin olive oil - its compound olecanthal helps to block prostaglandins  9) chili peppers- can be eaten or applied topically via capsaicin 10) mint- helpful for headache, muscle aches, joint pain, and itching 11) garlic- reduces inflammation  Link to further information on diet for chronic pain: [email or web address].[email or web address]/  -continue aspirin -discussed that uneven prosthetic height could be contributing to back pain -Provided with a pain relief journal and discussed that it contains  foods and lifestyle tips to naturally help to improve pain. Discussed that these lifestyle strategies are also very good for health unlike some medications which can have negative side effects. Discussed that the act of keeping a journal can be therapeutic and helpful to realize patterns what helps to trigger and alleviate pain.    -MRI lumbar spine reviewed and discussed with patient: shows lumbar spinal stenosis and facet arthropathy -discussed starting to go to the gym tomorrow.  -discussed benefits of going to the gym with someone.  -encouraged use of heating pad to improve blood flow to back -discussed medial branch block but he would prefer to see if his pain improves after he gets a more functional prosthetic -Prescribed Zynex Nexwave  5. Leg swelling -discussed BNP is normal, decrease lasix to 20mg  and monitor for any change in swelling  6. Severe vitamin D deficiency -continue ergocalciferol 50,000U once per week for 7 weeks  7) CKD:  -discussed most recent kidney injury  8) Type 2 diabetes -discussed benefits of intermittent fasting -discussed risks and benefits of Trulicity  9) HTN: -BP is currently 158/79 -Advised checking BP daily at home and logging results to bring into follow-up appointment with PCP and myself. -Reviewed BP meds today.  -Advised regarding healthy foods that can help lower blood pressure and provided with a list: 1) citrus foods- high in vitamins and minerals 2) salmon and other fatty fish - reduces inflammation and oxylipins 3) swiss chard (leafy green)- high level of nitrates 4) pumpkin seeds- one of the best natural sources of magnesium 5) Beans and lentils- high in fiber, magnesium, and potassium 6) Berries- high in flavonoids 7) Amaranth (whole grain, can be cooked similarly to rice and oats)- high in magnesium and fiber 8) Pistachios- even more effective at reducing BP than other nuts 9) Carrots- high in phenolic compounds that relax blood  vessels and reduce inflammation 10) Celery- contain phthalides that relax tissues of arterial walls 11) Tomatoes- can also  improve cholesterol and reduce risk of heart disease 12) Broccoli- good source of magnesium, calcium, and potassium 13) Greek yogurt: high in potassium and calcium 14) Herbs and spices: Celery seed, cilantro, saffron, lemongrass, black cumin, ginseng, cinnamon, cardamom, sweet basil, and ginger 15) Chia and flax seeds- also help to lower cholesterol and blood sugar 16) Beets- high levels of nitrates that relax blood vessels  17) spinach and bananas- high in potassium  -Provided lise of supplements that can help with hypertension:  1) magnesium: one high quality brand is Bioptemizers since it contains all 7 types of magnesium, otherwise over the counter magnesium gluconate 400mg  is a good option 2) B vitamins 3) vitamin D 4) potassium 5) CoQ10 6) L-arginine 7) Vitamin C 8) Beetroot -Educated that goal BP is 120/80. -Made goal to incorporate some of the above foods into diet.

## 2022-11-14 NOTE — Patient Instructions (Addendum)
HTN: -Advised checking BP daily at home and logging results to bring into follow-up appointment with PCP and myself. -Reviewed BP meds today.  -Advised regarding healthy foods that can help lower blood pressure and provided with a list: 1) citrus foods- high in vitamins and minerals 2) salmon and other fatty fish - reduces inflammation and oxylipins 3) swiss chard (leafy green)- high level of nitrates 4) pumpkin seeds- one of the best natural sources of magnesium 5) Beans and lentils- high in fiber, magnesium, and potassium 6) Berries- high in flavonoids 7) Amaranth (whole grain, can be cooked similarly to rice and oats)- high in magnesium and fiber 8) Pistachios- even more effective at reducing BP than other nuts 9) Carrots- high in phenolic compounds that relax blood vessels and reduce inflammation 10) Celery- contain phthalides that relax tissues of arterial walls 11) Tomatoes- can also improve cholesterol and reduce risk of heart disease 12) Broccoli- good source of magnesium, calcium, and potassium 13) Greek yogurt: high in potassium and calcium 14) Herbs and spices: Celery seed, cilantro, saffron, lemongrass, black cumin, ginseng, cinnamon, cardamom, sweet basil, and ginger 15) Chia and flax seeds- also help to lower cholesterol and blood sugar 16) Beets- high levels of nitrates that relax blood vessels  17) spinach and bananas- high in potassium  -Provided lise of supplements that can help with hypertension:  1) magnesium: one high quality brand is Bioptemizers since it contains all 7 types of magnesium, otherwise over the counter magnesium gluconate 400mg is a good option 2) B vitamins 3) vitamin D 4) potassium 5) CoQ10 6) L-arginine 7) Vitamin C 8) Beetroot -Educated that goal BP is 120/80. -Made goal to incorporate some of the above foods into diet.    Foods that may reduce pain: 1) Ginger (especially studied for arthritis)- reduce leukotriene production to decrease  inflammation 2) Blueberries- high in phytonutrients that decrease inflammation 3) Salmon- marine omega-3s reduce joint swelling and pain 4) Pumpkin seeds- reduce inflammation 5) dark chocolate- reduces inflammation 6) turmeric- reduces inflammation 7) tart cherries - reduce pain and stiffness 8) extra virgin olive oil - its compound olecanthal helps to block prostaglandins  9) chili peppers- can be eaten or applied topically via capsaicin 10) mint- helpful for headache, muscle aches, joint pain, and itching 11) garlic- reduces inflammation  Link to further information on diet for chronic pain: [email or web address]  

## 2022-11-29 ENCOUNTER — Ambulatory Visit (INDEPENDENT_AMBULATORY_CARE_PROVIDER_SITE_OTHER): Payer: 59 | Admitting: Podiatry

## 2022-11-29 ENCOUNTER — Encounter: Payer: Self-pay | Admitting: Podiatry

## 2022-11-29 VITALS — Ht 76.0 in | Wt 354.0 lb

## 2022-11-29 DIAGNOSIS — M79675 Pain in left toe(s): Secondary | ICD-10-CM | POA: Diagnosis not present

## 2022-11-29 DIAGNOSIS — B351 Tinea unguium: Secondary | ICD-10-CM

## 2022-11-29 DIAGNOSIS — E1142 Type 2 diabetes mellitus with diabetic polyneuropathy: Secondary | ICD-10-CM

## 2022-11-29 NOTE — Progress Notes (Signed)
  Subjective:  Patient ID: Trevor [name], male    DOB: 1955/02/13,  MRN: [id number]  Chief Complaint  Patient presents with   Nail Problem    dfc    67 y.o. male returns for the above complaint.  Patient presents with thickened elongated mycotic toenails of the left lower extremity x5.  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  Patient states that they have been painful when ambulating.  He is not able to debride down himself.  He is a type II diabetic with last A1c of 9.6.  He does not have any secondary complaints today.    Objective:   There were no vitals filed for this visit.  Podiatric Exam: Vascular: dorsalis pedis and posterior tibial pulses are palpable left capillary return is immediate. Temperature gradient is WNL. Skin turgor WNL  Sensorium: Normal Semmes Weinstein monofilament test. Normal tactile sensation left Nail Exam: Pt has thick disfigured discolored nails with subungual debris noted bilateral entire nail hallux through fifth toenails Ulcer Exam: There is no evidence of ulcer or pre-ulcerative changes or infection. Orthopedic Exam: Muscle tone and strength are WNL. No limitations in general ROM. No crepitus or effusions noted. HAV   left.  Hammer toes 2-5   left.  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 No infection or ulcers.  Severe dryness noted to both lower extremity plantar  Assessment & Plan:  Patient was evaluated and treated and all questions answered.  Hammertoes second and third -I explained to patient the etiology of hammertoe measurement options were discussed.  I educated him on taking the pressure off of the hammertoe by getting wider shoes and making shoe gear modification.  Toe protectors were also dispensed to take the pressure off of the hammertoe.  Ultimately patient is not an ideal surgical candidate for hammertoe correction. -Toe protectors were  dispensed  Xerosis/fissuring plantar foot -I explained to the patient the etiology of xerosis and various treatment options were extensively discussed.  I explained to the patient the importance of maintaining moisturization of the skin with application of over-the-counter lotion such as Eucerin or Luciderm.  Given that patient has failed over-the-counter therapy I believe patient will benefit from ammonium lactate cream.  Ammonium lactate was sent to the pharmacy.   Swelling to the left lower extremity -Improving.  Onychomycosis with pain x5 -Nails palliatively debrided as below. -Educated on self-care -Penlac was also dispensed to help with topical application decrease the fungal growth.  I have asked him to apply twice a day for 6 months.  Patient states understanding  Procedure: Nail Debridement Rationale: pain  Type of Debridement: manual, sharp debridement. Instrumentation: Nail nipper, rotary burr. Number of Nails: 5   Procedures and Treatment: Consent by patient was obtained for treatment procedures. The patient understood the discussion of treatment and procedures well. All questions were answered thoroughly reviewed. Debridement of mycotic and hypertrophic toenails, 1 through 5 bilateral and clearing of subungual debris. No ulceration, no infection noted.  Return Visit-Office Procedure: Patient instructed to return to the office for a follow up visit 3 months for continued evaluation and treatment.  Nicholes Rough, DPM    No follow-ups on file.

## 2022-12-12 ENCOUNTER — Telehealth: Payer: Self-pay | Admitting: Physical Medicine and Rehabilitation

## 2022-12-12 NOTE — Telephone Encounter (Signed)
Patient called 11/29 and left a voicemail requesting a new prosthetic leg script , patient states he needs to be fitted into a new one due to weight gain

## 2023-02-14 ENCOUNTER — Encounter: Payer: 59 | Admitting: Physical Medicine and Rehabilitation

## 2023-02-28 ENCOUNTER — Ambulatory Visit: Payer: 59 | Admitting: Podiatry

## 2023-03-02 ENCOUNTER — Ambulatory Visit: Payer: 59 | Admitting: Podiatry

## 2023-03-09 ENCOUNTER — Ambulatory Visit: Payer: 59 | Admitting: Podiatry

## 2023-03-16 ENCOUNTER — Ambulatory Visit (INDEPENDENT_AMBULATORY_CARE_PROVIDER_SITE_OTHER): Payer: 59 | Admitting: Podiatry

## 2023-03-16 DIAGNOSIS — B351 Tinea unguium: Secondary | ICD-10-CM | POA: Diagnosis not present

## 2023-03-16 DIAGNOSIS — M79675 Pain in left toe(s): Secondary | ICD-10-CM

## 2023-03-16 DIAGNOSIS — E1142 Type 2 diabetes mellitus with diabetic polyneuropathy: Secondary | ICD-10-CM | POA: Diagnosis not present

## 2023-03-16 NOTE — Progress Notes (Signed)
  Subjective:  Patient ID: Trevor [name], male    DOB: July 03, 1955,  MRN: [id number]  Chief Complaint  Patient presents with   Nail Problem    68 y.o. male returns for the above complaint.  Patient presents with thickened elongated mycotic toenails of the left lower extremity x5.  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  Patient states that they have been painful when ambulating.  He is not able to debride down himself.  He is a type II diabetic with last A1c of 9.6.  He does not have any secondary complaints today.    Objective:   There were no vitals filed for this visit.  Podiatric Exam: Vascular: dorsalis pedis and posterior tibial pulses are palpable left capillary return is immediate. Temperature gradient is WNL. Skin turgor WNL  Sensorium: Normal Semmes Weinstein monofilament test. Normal tactile sensation left Nail Exam: Pt has thick disfigured discolored nails with subungual debris noted bilateral entire nail hallux through fifth toenails Ulcer Exam: There is no evidence of ulcer or pre-ulcerative changes or infection. Orthopedic Exam: Muscle tone and strength are WNL. No limitations in general ROM. No crepitus or effusions noted. HAV   left.  Hammer toes 2-5   left.  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 No infection or ulcers.  Severe dryness noted to both lower extremity plantar  Assessment & Plan:  Patient was evaluated and treated and all questions answered.  Hammertoes second and third -I explained to patient the etiology of hammertoe measurement options were discussed.  I educated him on taking the pressure off of the hammertoe by getting wider shoes and making shoe gear modification.  Toe protectors were also dispensed to take the pressure off of the hammertoe.  Ultimately patient is not an ideal surgical candidate for hammertoe correction. -Toe protectors were dispensed  Xerosis/fissuring  plantar foot -I explained to the patient the etiology of xerosis and various treatment options were extensively discussed.  I explained to the patient the importance of maintaining moisturization of the skin with application of over-the-counter lotion such as Eucerin or Luciderm.  Given that patient has failed over-the-counter therapy I believe patient will benefit from ammonium lactate cream.  Ammonium lactate was sent to the pharmacy.   Swelling to the left lower extremity -Improving.  Onychomycosis with pain x5 -Nails palliatively debrided as below. -Educated on self-care -Penlac was also dispensed to help with topical application decrease the fungal growth.  I have asked him to apply twice a day for 6 months.  Patient states understanding  Procedure: Nail Debridement Rationale: pain  Type of Debridement: manual, sharp debridement. Instrumentation: Nail nipper, rotary burr. Number of Nails: 5   Procedures and Treatment: Consent by patient was obtained for treatment procedures. The patient understood the discussion of treatment and procedures well. All questions were answered thoroughly reviewed. Debridement of mycotic and hypertrophic toenails, 1 through 5 bilateral and clearing of subungual debris. No ulceration, no infection noted.  Return Visit-Office Procedure: Patient instructed to return to the office for a follow up visit 3 months for continued evaluation and treatment.  Nicholes Rough, DPM    No follow-ups on file.

## 2023-03-21 ENCOUNTER — Encounter: Payer: 59 | Attending: Physical Medicine and Rehabilitation | Admitting: Physical Medicine and Rehabilitation

## 2023-03-21 ENCOUNTER — Encounter: Payer: Self-pay | Admitting: Physical Medicine and Rehabilitation

## 2023-03-21 VITALS — BP 138/74 | HR 52 | Ht 76.0 in

## 2023-03-21 DIAGNOSIS — G8929 Other chronic pain: Secondary | ICD-10-CM | POA: Diagnosis present

## 2023-03-21 DIAGNOSIS — M25522 Pain in left elbow: Secondary | ICD-10-CM | POA: Insufficient documentation

## 2023-03-21 DIAGNOSIS — M545 Low back pain, unspecified: Secondary | ICD-10-CM | POA: Diagnosis present

## 2023-03-21 DIAGNOSIS — R269 Unspecified abnormalities of gait and mobility: Secondary | ICD-10-CM

## 2023-03-21 DIAGNOSIS — M5440 Lumbago with sciatica, unspecified side: Secondary | ICD-10-CM | POA: Insufficient documentation

## 2023-03-21 DIAGNOSIS — Z89511 Acquired absence of right leg below knee: Secondary | ICD-10-CM | POA: Diagnosis present

## 2023-03-21 DIAGNOSIS — M25521 Pain in right elbow: Secondary | ICD-10-CM | POA: Diagnosis not present

## 2023-03-21 DIAGNOSIS — M5441 Lumbago with sciatica, right side: Secondary | ICD-10-CM

## 2023-03-21 NOTE — Progress Notes (Signed)
 Subjective:    Patient ID: Trevor [name], male    DOB: [birth year], 68 y.o.   MRN: [id number]  Trevor [name] is a 68 year old [name] right-handed male who presents for follow-up of diabetes mellitus, hypertension, legally blind, and CKD presents for f/u of right BKA,  low back pain, kidney injury, vitamin D deficiency, suboptimal magnesium levels, lower extremity swelling.  1) Low back pain -worst when standing -does not want lidocaine patch -Interested in tens unit -resting helps the most -pain has been stable.  Currently has pain in his lower back -he feels that this is what most limits his walking -he is not interested in injections -he feels that the lack of mobility in his prosthesis is what most contributes to his back pain.  He asks about the results of his most recent Lumbar MRI He asked whether a more advanced prosthesis would be him to better ambulate.  It hurts mostly when he is walking He gets winded standing and walking He finds the Tramadol helpful, takes intermediately, does not need a refill today, he tries to limit use as is afraid of addiction, it does help when he takes it -sitting relieves his pain -not doing heating pad -does not feel he can afford extracorporeal shockwave therapy -he cannot apply lidocaine patch very well, he has nurse to help.  -last time he worked with PT he felt that PT was dismissive and he was discharged due to lack of progressive.  -joined the gym  2) Right elbow pain -fell on concrete. -Xrs ordered at [facility].    3) Lower extremity swelling: - limited in his walking due to his back pain -lasix seems to be ineffective- he has been taking 40mg    4) Right BKA -he would like to do PT after getting his new prosthetics -still swelling- uses shrinker -his prosthetist told him about a sleeve for water -getting prosthesis tomorrow   5) Fall -he had no lasting deficits but was embarrassed -he hurt his elbow and there was a  lesion   Pain Inventory Average Pain 3 Pain Right Now 1 My pain is intermittent and dull  In the last 24 hours, has pain interfered with the following? General activity 1 Relation with others 0 Enjoyment of life 0 What TIME of day is your pain at its worst? Morning daytime evening Sleep (in general) Fair  Pain is worse with: walking and standing some activities Pain improves with: rest and medication Relief from Meds: 10     No family history on file. Social History   Socioeconomic History   Marital status: Single    Spouse name: Not on file   Number of children: Not on file   Years of education: Not on file   Highest education level: Not on file  Occupational History   Not on file  Tobacco Use   Smoking status: Never   Smokeless tobacco: Never  Vaping Use   Vaping status: Never Used  Substance and Sexual Activity   Alcohol use: No   Drug use: Never   Sexual activity: Not on file  Other Topics Concern   Not on file  Social History Narrative   Not on file   Social Drivers of Health   Financial Resource Strain: Not on file  Food Insecurity: Not on file  Transportation Needs: Not on file  Physical Activity: Not on file  Stress: Not on file  Social Connections: Not on file   Past Surgical History:  Procedure Laterality  Date   AMPUTATION Right 03/31/2017   Procedure: RIGHT FOOT 1ST AND 2ND RAY AMPUTATION;  Surgeon: Nadara Mustard, MD;  Location: [facility] OR;  Service: Orthopedics;  Laterality: Right;   AMPUTATION Right 04/01/2017   Procedure: AMPUTATION BELOW KNEE;  Surgeon: Nadara Mustard, MD;  Location: [facility] OR;  Service: Orthopedics;  Laterality: Right;   COLONOSCOPY WITH PROPOFOL N/A 10/02/2017   Procedure: COLONOSCOPY WITH PROPOFOL;  Surgeon: Wyline Mood, MD;  Location: [facility] ENDOSCOPY;  Service: Gastroenterology;  Laterality: N/A;   COLONOSCOPY WITH PROPOFOL N/A 11/06/2020   Procedure: COLONOSCOPY WITH PROPOFOL;  Surgeon: Wyline Mood, MD;  Location: [facility]  ENDOSCOPY;  Service: Gastroenterology;  Laterality: N/A;   CORNEAL TRANSPLANT     EYE SURGERY     Past Medical History:  Diagnosis Date   Complication of anesthesia    COVID-29 Dec 2018   Diabetes mellitus without complication (HCC)    Heart murmur    Hyperlipidemia    Hypertension    Obesity (BMI 30-39.9) 06/22/2020   Sleep apnea    There were no vitals taken for this visit.  Opioid Risk Score:   Fall Risk Score:  `1  Depression screen PHQ 2/9     11/14/2022    2:48 PM 08/09/2022    1:11 PM 05/02/2022    2:50 PM 02/01/2022    1:39 PM 11/26/2021   11:07 AM 10/01/2021   10:45 AM 10/12/2017    9:45 AM  Depression screen PHQ 2/9  Decreased Interest 0 0 0  0 0 0  Down, Depressed, Hopeless 0 0 0 0 0 0 0  PHQ - 2 Score 0 0 0 0 0 0 0   Review of Systems  Constitutional: Negative.   HENT: Negative.    Eyes: Negative.   Respiratory: Negative.    Cardiovascular: Negative.   Gastrointestinal: Negative.   Endocrine: Negative.        High blood sugar  Genitourinary: Negative.   Musculoskeletal:  Positive for arthralgias, back pain and gait problem.       RT arm  Skin: Negative.   Allergic/Immunologic: Negative.   Neurological:        Tingling   Hematological: Negative.   Psychiatric/Behavioral: Negative.    All other systems reviewed and are negative.     Objective:   Physical Exam  Gen: no distress, normal appearing, weight is 354 lbs HEENT: oral mucosa pink and moist, NCAT Cardio: Reg rate Chest: normal effort, normal rate of breathing Abd: soft, non-distended Ext: no edema Psych: pleasant, normal affect Skin: intact Neuro:  oriented x3 Musculoskeletal: Right BKA, antalgic gait, arrived in wheelchair, stable 03/21/23    Assessment & Plan:  Right-handed male with history of diabetes mellitus, hypertension, legally blind, and CKD presents for follow-up up for right BKA, vitamin D deficiency, and type 2 diabetes   1.  Right transtibial amputation            Discussed plan for prosthesis eval tomorrow          continue PT  Will sent script for water cover for his prosthesis so he can get into the pool, called [employer] to see if this is something that could be covered for him   2. Gait abnormality             Continue HEP             Cont cane for safety  Continue WC PRN  3. B/l elbow pain  continue  Volaren gel  4. Low back pain Continue aspirin -Provided with a pain relief journal and discussed that it contains foods and lifestyle tips to naturally help to improve pain. Discussed that these lifestyle strategies are also very good for health unlike some medications which can have negative side effects. Discussed that the act of keeping a journal can be therapeutic and helpful to realize patterns what helps to trigger and alleviate pain.    -MRI lumbar spine reviewed and discussed with patient: shows lumbar spinal stenosis and facet arthropathy -discussed starting to go to the gym tomorrow.  -discussed benefits of going to the gym with someone.  -encouraged use of heating pad to improve blood flow to back -discussed medial branch block but he would prefer to see if his pain improves after he gets a more functional prosthetic -Prescribed Zynex Nexwave   5. Leg swelling -discussed BNP is normal, decrease lasix to 20mg  and monitor for any change in swelling  6. Severe vitamin D deficiency -continue ergocalciferol 50,000U once per week for 7 weeks  7) CKD:  -discussed most recent kidney injury  8) Type 2 diabetes -discussed benefits of intermittent fasting -discussed risks and benefits of Trulicity  9) HTN: -BP is 141/79 today.  HTN: -BP is 141/79 today.  -Advised checking BP daily at home and logging results to bring into follow-up appointment with PCP and myself. -Reviewed BP meds today.  -Advised regarding healthy foods that can help lower blood pressure and provided with a list: 1) citrus foods- high in vitamins and minerals 2)  salmon and other fatty fish - reduces inflammation and oxylipins 3) swiss chard (leafy green)- high level of nitrates 4) pumpkin seeds- one of the best natural sources of magnesium 5) Beans and lentils- high in fiber, magnesium, and potassium 6) Berries- high in flavonoids 7) Amaranth (whole grain, can be cooked similarly to rice and oats)- high in magnesium and fiber 8) Pistachios- even more effective at reducing BP than other nuts 9) Carrots- high in phenolic compounds that relax blood vessels and reduce inflammation 10) Celery- contain phthalides that relax tissues of arterial walls 11) Tomatoes- can also improve cholesterol and reduce risk of heart disease 12) Broccoli- good source of magnesium, calcium, and potassium 13) Greek yogurt: high in potassium and calcium 14) Herbs and spices: Celery seed, cilantro, saffron, lemongrass, black cumin, ginseng, cinnamon, cardamom, sweet basil, and ginger 15) Chia and flax seeds- also help to lower cholesterol and blood sugar 16) Beets- high levels of nitrates that relax blood vessels  17) spinach and bananas- high in potassium  -Provided lise of supplements that can help with hypertension:  1) magnesium: one high quality brand is Bioptemizers since it contains all 7 types of magnesium, otherwise over the counter magnesium gluconate 400mg  is a good option 2) B vitamins 3) vitamin D 4) potassium 5) CoQ10 6) L-arginine 7) Vitamin C 8) Beetroot -Educated that goal BP is 120/80. -Made goal to incorporate some of the above foods into diet.

## 2023-04-06 ENCOUNTER — Ambulatory Visit: Payer: 59 | Admitting: Podiatry

## 2023-06-15 ENCOUNTER — Ambulatory Visit: Admitting: Podiatry

## 2023-06-19 ENCOUNTER — Encounter: Admitting: Physical Medicine and Rehabilitation

## 2023-06-27 ENCOUNTER — Ambulatory Visit (INDEPENDENT_AMBULATORY_CARE_PROVIDER_SITE_OTHER): Admitting: Podiatry

## 2023-06-27 DIAGNOSIS — B351 Tinea unguium: Secondary | ICD-10-CM

## 2023-06-27 DIAGNOSIS — M79675 Pain in left toe(s): Secondary | ICD-10-CM

## 2023-06-27 NOTE — Progress Notes (Signed)
  Subjective:  Patient ID: Trevor [name], male    DOB: 1955-06-07,  MRN: [id number]  Chief Complaint  Patient presents with   Nail Problem    Nail trim    68 y.o. male returns for the above complaint.  Patient presents with thickened elongated mycotic toenails of the left lower extremity x5.  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  Patient states that they have been painful when ambulating.  He is not able to debride down himself.  He is a type II diabetic with last A1c of 9.6.  He does not have any secondary complaints today.    Objective:   There were no vitals filed for this visit.  Podiatric Exam: Vascular: dorsalis pedis and posterior tibial pulses are palpable left capillary return is immediate. Temperature gradient is WNL. Skin turgor WNL  Sensorium: Normal Semmes Weinstein monofilament test. Normal tactile sensation left Nail Exam: Pt has thick disfigured discolored nails with subungual debris noted bilateral entire nail hallux through fifth toenails Ulcer Exam: There is no evidence of ulcer or pre-ulcerative changes or infection. Orthopedic Exam: Muscle tone and strength are WNL. No limitations in general ROM. No crepitus or effusions noted. HAV   left.  Hammer toes 2-5   left.  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 No infection or ulcers.  Severe dryness noted to both lower extremity plantar  Assessment & Plan:  Patient was evaluated and treated and all questions answered.  Hammertoes second and third -I explained to patient the etiology of hammertoe measurement options were discussed.  I educated him on taking the pressure off of the hammertoe by getting wider shoes and making shoe gear modification.  Toe protectors were also dispensed to take the pressure off of the hammertoe.  Ultimately patient is not an ideal surgical candidate for hammertoe correction. -Toe protectors were  dispensed  Xerosis/fissuring plantar foot -I explained to the patient the etiology of xerosis and various treatment options were extensively discussed.  I explained to the patient the importance of maintaining moisturization of the skin with application of over-the-counter lotion such as Eucerin or Luciderm.  Given that patient has failed over-the-counter therapy I believe patient will benefit from ammonium lactate  cream.  Ammonium lactate  was sent to the pharmacy.   Swelling to the left lower extremity -Improving.  Onychomycosis with pain x5 -Nails palliatively debrided as below. -Educated on self-care -Penlac  was also dispensed to help with topical application decrease the fungal growth.  I have asked him to apply twice a day for 6 months.  Patient states understanding  Procedure: Nail Debridement Rationale: pain  Type of Debridement: manual, sharp debridement. Instrumentation: Nail nipper, rotary burr. Number of Nails: 5   Procedures and Treatment: Consent by patient was obtained for treatment procedures. The patient understood the discussion of treatment and procedures well. All questions were answered thoroughly reviewed. Debridement of mycotic and hypertrophic toenails, 1 through 5 bilateral and clearing of subungual debris. No ulceration, no infection noted.  Return Visit-Office Procedure: Patient instructed to return to the office for a follow up visit 3 months for continued evaluation and treatment.  Tinnie Forehand, DPM    No follow-ups on file.

## 2023-07-17 ENCOUNTER — Other Ambulatory Visit: Payer: Self-pay

## 2023-07-17 ENCOUNTER — Telehealth: Payer: Self-pay

## 2023-07-17 DIAGNOSIS — Z860100 Personal history of colon polyps, unspecified: Secondary | ICD-10-CM

## 2023-07-17 MED ORDER — NA SULFATE-K SULFATE-MG SULF 17.5-3.13-1.6 GM/177ML PO SOLN: 1.0000 | Freq: Once | ORAL | 0 refills | Status: AC

## 2023-07-17 NOTE — Telephone Encounter (Signed)
 Gastroenterology Pre-Procedure Review  Request Date: 10/10/23 Requesting Physician: Dr. jinny  PATIENT REVIEW QUESTIONS: The patient responded to the following health history questions as indicated:    1. Are you having any GI issues? no 2. Do you have a personal history of Polyps? yes (last colonoscopy performed by dr. Therisa 11/06/20) 3. Do you have a family history of Colon Cancer or Polyps? no 4. Diabetes Mellitus?  IMORTANT MEDICATION REMINDERS: -(7) DAYS PRIOR STOP TRULICITY INJECTION -(2) DAYS PRIOR STOP METFORMIN -(1) DAY PRIOR STOP GLIPIZIDE  -TAKE 1/2 OF YOU USUAL AMOUNT OF INSULIN  THE DAY BEFORE 5. Joint replacements in the past 12 months?no 6. Major health problems in the past 3 months?no 7. Any artificial heart valves, MVP, or defibrillator?no    MEDICATIONS & ALLERGIES:    Patient reports the following regarding taking any anticoagulation/antiplatelet therapy:   Plavix, Coumadin, Eliquis, Xarelto, Lovenox , Pradaxa, Brilinta, or Effient? no Aspirin ? yes (81 mg daily)  Patient confirms/reports the following medications:  Current Outpatient Medications  Medication Sig Dispense Refill   ACCU-CHEK AVIVA PLUS test strip USE AS DIRECTED TO CHECK BLOOD SUGAR THREE TIMES A DAY     acetaminophen  (TYLENOL ) 325 MG tablet Take 2 tablets (650 mg total) by mouth every 6 (six) hours as needed for mild pain (or Fever >/= 101).     ammonium lactate  (AMLACTIN) 12 % lotion Apply 1 application topically as needed for dry skin. 400 g 1   aspirin  EC 81 MG tablet Take 81 mg by mouth daily.     atenolol  (TENORMIN ) 100 MG tablet Take 100 mg by mouth daily.      atorvastatin  (LIPITOR) 40 MG tablet Take 40 mg by mouth daily.     BD INSULIN  SYRINGE U/F 31G X 5/16 1 ML MISC      ciclopirox  (PENLAC ) 8 % solution Apply topically at bedtime. Apply over nail and surrounding skin. Apply daily over previous coat. After seven (7) days, may remove with alcohol and continue cycle. 6.6 mL 1   clobetasol cream  (TEMOVATE) 0.05 % Apply topically 2 (two) times daily.     clotrimazole (LOTRIMIN) 1 % cream Apply topically.     diclofenac  Sodium (VOLTAREN ) 1 % GEL Apply 2 g topically 4 (four) times daily. 350 g 1   fluticasone  (FLONASE ) 50 MCG/ACT nasal spray Place 1 spray into both nostrils daily. 30 mL 0   furosemide  (LASIX ) 40 MG tablet Take 40 mg by mouth daily.     glipiZIDE  (GLUCOTROL ) 10 MG tablet Take 10 mg by mouth 2 (two) times daily before a meal.     insulin  lispro (HUMALOG) 100 UNIT/ML injection      lactulose  (CHRONULAC ) 10 GM/15ML solution Take 30 mLs (20 g total) by mouth daily as needed for mild constipation. 120 mL 0   LANTUS  100 UNIT/ML injection Inject 70 Units into the skin daily.     losartan  (COZAAR ) 100 MG tablet Take 100 mg by mouth daily.     metFORMIN (GLUCOPHAGE) 1000 MG tablet Take 1,000 mg by mouth 2 (two) times daily.     mupirocin ointment (BACTROBAN) 2 %      NIFEdipine  (PROCARDIA  XL/NIFEDICAL XL) 60 MG 24 hr tablet      OneTouch Delica Lancets 30G MISC USE 1 LANCET AS DIRECTED THREE TIMES DAILY     polyethylene glycol (MIRALAX  / GLYCOLAX ) packet Take 17 g by mouth daily as needed for mild constipation. 14 each 0   polyethylene glycol-electrolytes (NULYTELY ) 420 g solution Prepare according to  package instructions. Starting at 5:00 PM: Drink one 8 oz glass of mixture every 15 minutes until you finish half of the jug. Five hours prior to procedure, drink 8 oz glass of mixture every 15 minutes until it is all gone. Make sure you do not drink anything 4 hours prior to your procedure. 4000 mL 0   spironolactone  (ALDACTONE ) 25 MG tablet Take 25 mg by mouth daily.     traMADol  (ULTRAM ) 50 MG tablet Take 0.5-1 tablets (25-50 mg total) by mouth every 8 (eight) hours as needed for severe pain. 30 tablet 0   TRULICITY 0.75 MG/0.5ML SOPN SMARTSIG:0.5 Milliliter(s) SUB-Q Once a Week     JARDIANCE 10 MG TABS tablet Take 10 mg by mouth daily. (Patient not taking: Reported on 07/17/2023)      potassium chloride  20 MEQ TBCR Take 40 mEq by mouth 2 (two) times daily for 3 days. (Patient not taking: Reported on 07/17/2023) 12 tablet 0   No current facility-administered medications for this visit.    Patient confirms/reports the following allergies:  Allergies  Allergen Reactions   Enalapril Cough    No orders of the defined types were placed in this encounter.   AUTHORIZATION INFORMATION Primary Insurance: 1D#: Group #:  Secondary Insurance: 1D#: Group #:  SCHEDULE INFORMATION: Date: 10/10/23 Time: Location: [facility]

## 2023-07-21 NOTE — Progress Notes (Signed)
 [facility] [address] [place], [place] [address]  Pulmonary Sleep Medicine   Office Visit Note  Patient Name: Trevor [name] DOB: 06/07/1955 MRN [id number]    Chief Complaint: Obstructive Sleep Apnea visit  Brief History:  Trevor [name] is seen today for an annual follow up visit for BiPAP@ IPAP max 22, EPAP min 8, PS 6 cmH2O. The patient has a 16 year history of sleep apnea. Patient is using PAP nightly.  The patient feels rested after sleeping with PAP.  The patient reports benefiting from PAP use. Reported sleepiness is improved and the Epworth Sleepiness Score is 6 out of 24. The patient does not take naps. The patient complains of the following: some fatigue.  The compliance download shows 82% compliance with an average use time of 6 hours 16 minutes. The AHI is 4.4.  The patient does not complain of limb movements disrupting sleep. The patient continues to require PAP therapy in order to eliminate sleep apnea.  The patient admits to staying up late some nights binge watching tv shows.   ROS  General: (-) fever, (-) chills, (-) night sweat Nose and Sinuses: (-) nasal stuffiness or itchiness, (-) postnasal drip, (-) nosebleeds, (-) sinus trouble. Mouth and Throat: (-) sore throat, (-) hoarseness. Neck: (-) swollen glands, (-) enlarged thyroid, (-) neck pain. Respiratory: - cough, - shortness of breath, - wheezing. Neurologic: - numbness, - tingling. Psychiatric: - anxiety, - depression   Current Medication: Outpatient Encounter Medications as of 07/24/2023  Medication Sig Note   spironolactone  (ALDACTONE ) 50 MG tablet Take 50 mg by mouth daily.    ACCU-CHEK AVIVA PLUS test strip USE AS DIRECTED TO CHECK BLOOD SUGAR THREE TIMES A DAY    acetaminophen  (TYLENOL ) 325 MG tablet Take 2 tablets (650 mg total) by mouth every 6 (six) hours as needed for mild pain (or Fever >/= 101).    ammonium lactate  (AMLACTIN) 12 % lotion Apply 1 application topically as needed for dry  skin.    aspirin  EC 81 MG tablet Take 81 mg by mouth daily.    atenolol  (TENORMIN ) 100 MG tablet Take 100 mg by mouth daily.     atorvastatin  (LIPITOR) 40 MG tablet Take 40 mg by mouth daily.    BD INSULIN  SYRINGE U/F 31G X 5/16 1 ML MISC     ciclopirox  (PENLAC ) 8 % solution Apply topically at bedtime. Apply over nail and surrounding skin. Apply daily over previous coat. After seven (7) days, may remove with alcohol and continue cycle.    clobetasol cream (TEMOVATE) 0.05 % Apply topically 2 (two) times daily.    clotrimazole (LOTRIMIN) 1 % cream Apply topically.    diclofenac  Sodium (VOLTAREN ) 1 % GEL Apply 2 g topically 4 (four) times daily.    fluticasone  (FLONASE ) 50 MCG/ACT nasal spray Place 1 spray into both nostrils daily.    furosemide  (LASIX ) 40 MG tablet Take 40 mg by mouth daily.    glipiZIDE  (GLUCOTROL ) 10 MG tablet Take 10 mg by mouth 2 (two) times daily before a meal.    insulin  lispro (HUMALOG) 100 UNIT/ML injection     lactulose  (CHRONULAC ) 10 GM/15ML solution Take 30 mLs (20 g total) by mouth daily as needed for mild constipation.    LANTUS  100 UNIT/ML injection Inject 70 Units into the skin daily.    losartan  (COZAAR ) 100 MG tablet Take 100 mg by mouth daily.    metFORMIN (GLUCOPHAGE) 1000 MG tablet Take 1,000 mg by mouth 2 (two) times daily.  mupirocin ointment (BACTROBAN) 2 %     NIFEdipine  (PROCARDIA  XL/NIFEDICAL XL) 60 MG 24 hr tablet     OneTouch Delica Lancets 30G MISC USE 1 LANCET AS DIRECTED THREE TIMES DAILY    polyethylene glycol (MIRALAX  / GLYCOLAX ) packet Take 17 g by mouth daily as needed for mild constipation.    polyethylene glycol-electrolytes (NULYTELY ) 420 g solution Prepare according to package instructions. Starting at 5:00 PM: Drink one 8 oz glass of mixture every 15 minutes until you finish half of the jug. Five hours prior to procedure, drink 8 oz glass of mixture every 15 minutes until it is all gone. Make sure you do not drink anything 4 hours prior  to your procedure.    traMADol  (ULTRAM ) 50 MG tablet Take 0.5-1 tablets (25-50 mg total) by mouth every 8 (eight) hours as needed for severe pain. 02/01/2022: Taken about 2 months ago. Not here for count.   TRULICITY 0.75 MG/0.5ML SOPN SMARTSIG:0.5 Milliliter(s) SUB-Q Once a Week    [DISCONTINUED] JARDIANCE 10 MG TABS tablet Take 10 mg by mouth daily. (Patient not taking: Reported on 07/17/2023)    [DISCONTINUED] potassium chloride  20 MEQ TBCR Take 40 mEq by mouth 2 (two) times daily for 3 days. (Patient not taking: Reported on 07/17/2023)    [DISCONTINUED] spironolactone  (ALDACTONE ) 25 MG tablet Take 25 mg by mouth daily.    No facility-administered encounter medications on file as of 07/24/2023.    Surgical History: Past Surgical History:  Procedure Laterality Date   AMPUTATION Right 03/31/2017   Procedure: RIGHT FOOT 1ST AND 2ND RAY AMPUTATION;  Surgeon: Harden Jerona GAILS, MD;  Location: [facility] OR;  Service: Orthopedics;  Laterality: Right;   AMPUTATION Right 04/01/2017   Procedure: AMPUTATION BELOW KNEE;  Surgeon: Harden Jerona GAILS, MD;  Location: [facility] OR;  Service: Orthopedics;  Laterality: Right;   COLONOSCOPY WITH PROPOFOL  N/A 10/02/2017   Procedure: COLONOSCOPY WITH PROPOFOL ;  Surgeon: Therisa Bi, MD;  Location: [facility] ENDOSCOPY;  Service: Gastroenterology;  Laterality: N/A;   COLONOSCOPY WITH PROPOFOL  N/A 11/06/2020   Procedure: COLONOSCOPY WITH PROPOFOL ;  Surgeon: Therisa Bi, MD;  Location: [facility] ENDOSCOPY;  Service: Gastroenterology;  Laterality: N/A;   CORNEAL TRANSPLANT     EYE SURGERY      Medical History: Past Medical History:  Diagnosis Date   Complication of anesthesia    COVID-29 Dec 2018   Diabetes mellitus without complication (HCC)    Heart murmur    Hyperlipidemia    Hypertension    Obesity (BMI 30-39.9) 06/22/2020   Sleep apnea     Family History: Non contributory to the present illness  Social History: Social History   Socioeconomic History   Marital status:  Single    Spouse name: Not on file   Number of children: Not on file   Years of education: Not on file   Highest education level: Not on file  Occupational History   Not on file  Tobacco Use   Smoking status: Never   Smokeless tobacco: Never  Vaping Use   Vaping status: Never Used  Substance and Sexual Activity   Alcohol use: No   Drug use: Never   Sexual activity: Not on file  Other Topics Concern   Not on file  Social History Narrative   Not on file   Social Drivers of Health   Financial Resource Strain: Not on file  Food Insecurity: Not on file  Transportation Needs: Not on file  Physical Activity: Not on file  Stress: Not on file  Social Connections: Not on file  Intimate Partner Violence: Not on file    Vital Signs: Blood pressure (!) 202/107, pulse 65. There is no height or weight on file to calculate BMI.    Examination: General Appearance: The patient is well-developed, well-nourished, and in no distress. Neck Circumference: 47 cm Skin: Gross inspection of skin unremarkable. Head: normocephalic, no gross deformities. Eyes: no gross deformities noted. ENT: ears appear grossly normal Neurologic: Alert and oriented. No involuntary movements.  STOP BANG RISK ASSESSMENT S (snore) Have you been told that you snore?     NO   T (tired) Are you often tired, fatigued, or sleepy during the day?   YES  O (obstruction) Do you stop breathing, choke, or gasp during sleep? NO   P (pressure) Do you have or are you being treated for high blood pressure? YES   B (BMI) Is your body index greater than 35 kg/m? YES   A (age) Are you 16 years old or older? YES   N (neck) Do you have a neck circumference greater than 16 inches?   YES   G (gender) Are you a male? YES   TOTAL STOP/BANG "YES" ANSWERS 6       A STOP-Bang score of 2 or less is considered low risk, and a score of 5 or more is high risk for having either moderate or severe OSA. For people who score 3 or 4,  doctors may need to perform further assessment to determine how likely they are to have OSA.         EPWORTH SLEEPINESS SCALE:  Scale:  (0)= no chance of dozing; (1)= slight chance of dozing; (2)= moderate chance of dozing; (3)= high chance of dozing  Chance  Situtation    Sitting and reading: 1    Watching TV: 2    Sitting Inactive in public: 0    As a passenger in car: 0      Lying down to rest: 2    Sitting and talking: 0    Sitting quielty after lunch: 1    In a car, stopped in traffic: 0   TOTAL SCORE:   6 out of 24    SLEEP STUDIES:  PSG (11/14/07)  AHI 77, min SPO2 85% TITRATION (12/12/07) CPAP at 12 cmh20 TITRATION (09/01/21) 2 week trial of auto BIPAP    CPAP COMPLIANCE DATA:  Date Range: 07/21/2022-07/20/2023  Average Daily Use: 6 hours 16 minutes  Median Use: 6 hours 5 minutes  Compliance for > 4 Hours: 82%  AHI: 4.4 respiratory events per hour  Days Used: 351/365 days  Mask Leak: 27.6  95th Percentile Pressure: 17.1/11.1         LABS: No results found for this or any previous visit (from the past 2160 hours).  Radiology: MR LUMBAR SPINE WO CONTRAST Result Date: 11/15/2021 CLINICAL DATA:  Low back pain for over 6 weeks. Worsening over the past 6 months. EXAM: MRI LUMBAR SPINE WITHOUT CONTRAST TECHNIQUE: Multiplanar, multisequence MR imaging of the lumbar spine was performed. No intravenous contrast was administered. COMPARISON:  None Available. FINDINGS: Segmentation:  Standard. Alignment:  Minimal retrolisthesis of L5 on S1. Vertebrae: No acute fracture, evidence of discitis, or aggressive bone lesion. Conus medullaris and cauda equina: Conus extends to the L1 level. Conus and cauda equina appear normal. Paraspinal and other soft tissues: No acute paraspinal abnormality. Disc levels: Disc spaces: Degenerative disease with disc height loss throughout the lumbar spine  most severe at L5-S1. T12-L1: No significant disc bulge. No neural  foraminal stenosis. No central canal stenosis. Mild bilateral facet arthropathy. L1-L2: Large left lateral disc osteophyte complex. No foraminal or central canal stenosis. L2-L3: Broad-based disc bulge eccentric towards the left. Mild bilateral facet arthropathy. Moderate-severe spinal stenosis. Mild right and moderate left foraminal stenosis. L3-L4: Mild broad-based disc bulge. Mild left foraminal stenosis. No right foraminal stenosis. No spinal stenosis. L4-L5: Broad-based disc bulge. Mild bilateral facet arthropathy. Mild spinal stenosis. Bilateral lateral recess stenosis. Severe left foraminal stenosis. Mild right foraminal stenosis. L5-S1: Broad-based disc bulge with a broad central disc protrusion. Severe bilateral foraminal stenosis. Bilateral subarticular recess stenosis. Mild spinal stenosis. Mild bilateral facet arthropathy. IMPRESSION: 1. At L2-3 there is a broad-based disc bulge eccentric towards the left. Mild bilateral facet arthropathy. Moderate-severe spinal stenosis. Mild right and moderate left foraminal stenosis. 2. At L4-5 there is a broad-based disc bulge. Mild bilateral facet arthropathy. Mild spinal stenosis. Bilateral lateral recess stenosis. Severe left foraminal stenosis. Mild right foraminal stenosis. 3. At L5-S1 there is a broad-based disc bulge with a broad central disc protrusion. Severe bilateral foraminal stenosis. Bilateral subarticular recess stenosis. Mild spinal stenosis. Mild bilateral facet arthropathy. 4. No acute osseous injury of the lumbar spine. Electronically Signed   By: Julaine Blanch M.D.   On: 11/15/2021 09:53    No results found.  No results found.    Assessment and Plan: Patient Active Problem List   Diagnosis Date Noted   Encounter for BiPAP use counseling 07/24/2023   Morbid obesity with BMI of 40.0-44.9, adult (HCC) 07/24/2023   Complex sleep apnea syndrome 09/06/2021   CPAP use counseling 07/26/2021   Morbid obesity (HCC) 07/26/2021   OSA  (obstructive sleep apnea) 06/22/2020   Obesity (BMI 30-39.9) 06/22/2020   Pain of both elbows 12/23/2019   Elevated lactic acid level    Diabetic ketoacidosis without coma associated with type 2 diabetes mellitus (HCC)    Cellulitis of left foot 12/17/2018   Pain due to onychomycosis of toenail of right foot 07/05/2018   Abnormality of gait 10/12/2017   Type 2 diabetes mellitus with hyperglycemia, with long-term current use of insulin  (HCC)    Flatulence    Hypomagnesemia    Unilateral complete BKA, right, sequela (HCC)    Benign essential HTN    Hypoalbuminemia due to protein-calorie malnutrition (HCC)    S/P below knee amputation, right (HCC) 04/12/2017   Acute blood loss anemia    Other encephalopathy 04/08/2017   Encephalopathy 04/08/2017   Altered mental status 04/08/2017   Labile blood pressure    Labile blood glucose    Drug induced constipation    Stage 3 chronic kidney disease (HCC)    Bacteremia    S/P unilateral BKA (below knee amputation), right (HCC) 04/04/2017   PAD (peripheral artery disease) (HCC)    Type 2 diabetes mellitus with right diabetic foot ulcer (HCC)    Post-operative pain    Legally blind    Upper GI bleed    Streptococcal bacteremia 04/01/2017   Hypokalemia 03/30/2017   Uncontrolled type 2 diabetes mellitus with hyperglycemia, with long-term current use of insulin  (HCC) 03/30/2017   Type 2 diabetes mellitus with peripheral neuropathy (HCC) 03/30/2017   AKI (acute kidney injury) (HCC) 03/30/2017   CKD stage 3 due to type 2 diabetes mellitus (HCC) 03/30/2017   Sepsis (HCC) 03/30/2017   Heart murmur 11/22/2016   Hyperlipidemia associated with type 2 diabetes mellitus (HCC) 11/22/2016   Obstructive  sleep apnea syndrome 11/22/2016   Essential hypertension 12/17/2012  1. Complex sleep apnea syndrome (Primary) The patient does tolerate PAP and reports  benefit from PAP use. His apnea  control is somewhat variable night to night but overall averaging  controlled with ahi of 4.4. The patient was reminded how to clean equipment and advised to replace supplies routinely. The patient was also counselled on weight loss. The compliance is good.    Complex sleep apnea syndrome- continue with compliance with bipap. PAP continues to be medically necessary to control this patient's complex apnea. F/u one year.   2. Encounter for BiPAP use counseling PAP Counseling: had a lengthy discussion with the patient regarding the importance of PAP therapy in management of the sleep apnea. Patient appears to understand the risk factor reduction and also understands the risks associated with untreated sleep apnea. Patient will try to make a good faith effort to remain compliant with therapy. Also instructed the patient on proper cleaning of the device including the water must be changed daily if possible and use of distilled water is preferred. Patient understands that the machine should be regularly cleaned with appropriate recommended cleaning solutions that do not damage the PAP machine for example given white vinegar and water rinses. Other methods such as ozone treatment may not be as good as these simple methods to achieve cleaning.   3. Morbid obesity with BMI of 40.0-44.9, adult (HCC) Obesity Counseling: Had a lengthy discussion regarding patients BMI and weight issues. Patient was instructed on portion control as well as increased activity. Also discussed caloric restrictions with trying to maintain intake less than 2000 Kcal. Discussions were made in accordance with the 5As of weight management. Simple actions such as not eating late and if able to, taking a walk is suggested.   4. Essential hypertension Elevated today. He will check it at home and f/u with his doctor. Denies symptoms.       General Counseling: I have discussed the findings of the evaluation and examination with Trevor [name].  I have also discussed any further diagnostic evaluation thatmay be  needed or ordered today. Trevor [name] verbalizes understanding of the findings of todays visit. We also reviewed his medications today and discussed drug interactions and side effects including but not limited excessive drowsiness and altered mental states. We also discussed that there is always a risk not just to him but also people around him. he has been encouraged to call the office with any questions or concerns that should arise related to todays visit.  No orders of the defined types were placed in this encounter.       I have personally obtained a history, examined the patient, evaluated laboratory and imaging results, formulated the assessment and plan and placed orders. This patient was seen today by Lauraine Lay, PA-C in collaboration with Dr. Elfreda Bathe.   Elfreda DELENA Bathe, MD [facility] Diplomate ABMS Pulmonary Critical Care Medicine and Sleep Medicine

## 2023-07-24 ENCOUNTER — Ambulatory Visit (INDEPENDENT_AMBULATORY_CARE_PROVIDER_SITE_OTHER): Admitting: Internal Medicine

## 2023-07-24 VITALS — BP 202/107 | HR 65

## 2023-07-24 DIAGNOSIS — Z7189 Other specified counseling: Secondary | ICD-10-CM | POA: Diagnosis not present

## 2023-07-24 DIAGNOSIS — Z6841 Body Mass Index (BMI) 40.0 and over, adult: Secondary | ICD-10-CM

## 2023-07-24 DIAGNOSIS — G4739 Other sleep apnea: Secondary | ICD-10-CM | POA: Diagnosis not present

## 2023-07-24 DIAGNOSIS — I10 Essential (primary) hypertension: Secondary | ICD-10-CM | POA: Diagnosis not present

## 2023-07-24 NOTE — Patient Instructions (Signed)

## 2023-08-11 ENCOUNTER — Encounter: Attending: Physical Medicine and Rehabilitation | Admitting: Physical Medicine and Rehabilitation

## 2023-08-11 ENCOUNTER — Encounter: Payer: Self-pay | Admitting: Physical Medicine and Rehabilitation

## 2023-08-11 VITALS — BP 135/81 | HR 56 | Ht 76.0 in

## 2023-08-11 DIAGNOSIS — M5441 Lumbago with sciatica, right side: Secondary | ICD-10-CM | POA: Diagnosis present

## 2023-08-11 DIAGNOSIS — M5442 Lumbago with sciatica, left side: Secondary | ICD-10-CM | POA: Insufficient documentation

## 2023-08-11 DIAGNOSIS — I10 Essential (primary) hypertension: Secondary | ICD-10-CM | POA: Diagnosis not present

## 2023-08-11 DIAGNOSIS — Z89511 Acquired absence of right leg below knee: Secondary | ICD-10-CM | POA: Diagnosis not present

## 2023-08-11 DIAGNOSIS — G8929 Other chronic pain: Secondary | ICD-10-CM | POA: Insufficient documentation

## 2023-08-11 NOTE — Progress Notes (Signed)
 Subjective:    Patient ID: Trevor [name], male    DOB: 11/29/1955, 68 y.o.   MRN: [id number]  Trevor [name] is a 68 year old man right-handed male who presents for follow-up of diabetes mellitus, hypertension, legally blind, and CKD presents for f/u of right BKA,  low back pain, kidney injury, vitamin D  deficiency, suboptimal magnesium  levels, lower extremity swelling.  1) Low back pain -pain has been ok -worst when standing -does not want lidocaine  patch -Interested in tens unit -resting helps the most -pain has been stable.  Currently has pain in his lower back -he feels that this is what most limits his walking -he is not interested in injections -he feels that the lack of mobility in his prosthesis is what most contributes to his back pain.  He asks about the results of his most recent Lumbar MRI He asked whether a more advanced prosthesis would be him to better ambulate.  It hurts mostly when he is walking He gets winded standing and walking He finds the Tramadol  helpful, takes intermediately, does not need a refill today, he tries to limit use as is afraid of addiction, it does help when he takes it -sitting relieves his pain -not doing heating pad -does not feel he can afford extracorporeal shockwave therapy -he cannot apply lidocaine  patch very well, he has nurse to help.  -last time he worked with PT he felt that PT was dismissive and he was discharged due to lack of progressive.  -joined the gym  2) Right elbow pain -fell on concrete. -Xrs ordered at [facility].    3) Lower extremity swelling: - limited in his walking due to his back pain -lasix  seems to be ineffective- he has been taking 40mg    4) Right BKA -he would like to do PT after getting his new prosthetics -still swelling- uses shrinker -his prosthetist told him about a sleeve for water -getting prosthesis tomorrow -got a new prosthesis and feels like he went from good to bad -when he stands it hurts in  the residual limb -he followed with [facility] but they were not able to help   5) Fall -he had no lasting deficits but was embarrassed -he hurt his elbow and there was a lesion   Pain Inventory Average Pain 3 Pain Right Now 1 My pain is intermittent and dull  In the last 24 hours, has pain interfered with the following? General activity 2 Relation with others 0 Enjoyment of life 0 What TIME of day is your pain at its worst? daytime  Sleep (in general) Fair  Pain is worse with: standing some activities Pain improves with: rest and medication Relief from Meds: 10     No family history on file. Social History   Socioeconomic History   Marital status: Single    Spouse name: Not on file   Number of children: Not on file   Years of education: Not on file   Highest education level: Not on file  Occupational History   Not on file  Tobacco Use   Smoking status: Never   Smokeless tobacco: Never  Vaping Use   Vaping status: Never Used  Substance and Sexual Activity   Alcohol use: No   Drug use: Never   Sexual activity: Not on file  Other Topics Concern   Not on file  Social History Narrative   Not on file   Social Drivers of Health   Financial Resource Strain: Not on file  Food Insecurity:  Not on file  Transportation Needs: Not on file  Physical Activity: Not on file  Stress: Not on file  Social Connections: Not on file   Past Surgical History:  Procedure Laterality Date   AMPUTATION Right 03/31/2017   Procedure: RIGHT FOOT 1ST AND 2ND RAY AMPUTATION;  Surgeon: Trevor Jerona GAILS, MD;  Location: [facility] OR;  Service: Orthopedics;  Laterality: Right;   AMPUTATION Right 04/01/2017   Procedure: AMPUTATION BELOW KNEE;  Surgeon: Trevor Jerona GAILS, MD;  Location: [facility] OR;  Service: Orthopedics;  Laterality: Right;   COLONOSCOPY WITH PROPOFOL  N/A 10/02/2017   Procedure: COLONOSCOPY WITH PROPOFOL ;  Surgeon: Trevor Bi, MD;  Location: [facility] ENDOSCOPY;  Service: Gastroenterology;   Laterality: N/A;   COLONOSCOPY WITH PROPOFOL  N/A 11/06/2020   Procedure: COLONOSCOPY WITH PROPOFOL ;  Surgeon: Trevor Bi, MD;  Location[facility] ENDOSCOPY;  Service: Gastroenterology;  Laterality: N/A;   CORNEAL TRANSPLANT     EYE SURGERY     Past Medical History:  Diagnosis Date   Complication of anesthesia    COVID-29 Dec 2018   Diabetes mellitus without complication (HCC)    Heart murmur    Hyperlipidemia    Hypertension    Obesity (BMI 30-39.9) 06/22/2020   Sleep apnea    BP 135/81   Pulse (!) 56   Ht 6' 4 (1.93 m)   SpO2 94%   BMI 43.09 kg/m   Opioid Risk Score:   Fall Risk Score:  `1  Depression screen [facility] 2/9     08/11/2023   10:23 AM 03/21/2023    1:57 PM 11/14/2022    2:48 PM 08/09/2022    1:11 PM 05/02/2022    2:50 PM 02/01/2022    1:39 PM 11/26/2021   11:07 AM  Depression screen PH[facility] 2/9  Decreased Interest 0 0 0 0 0  0  Down, Depressed, Hopeless 0 0 0 0 0 0 0  PHQ - 2 Score 0 0 0 0 0 0 0   Review of Systems  Constitutional: Negative.   HENT: Negative.    Eyes: Negative.   Respiratory: Negative.    Cardiovascular: Negative.   Gastrointestinal: Negative.   Endocrine: Negative.        High blood sugar  Genitourinary: Negative.   Musculoskeletal:  Positive for arthralgias, back pain and gait problem.       RT arm  Skin: Negative.   Allergic/Immunologic: Negative.   Neurological:        Tingling   Hematological: Negative.   Psychiatric/Behavioral: Negative.    All other systems reviewed and are negative.     Objective:   Physical Exam  Gen: no distress, normal appearing, weight is 354 lbs HEENT: oral mucosa pink and moist, NCAT Cardio: Reg rate Chest: normal effort, normal rate of breathing Abd: soft, non-distended Ext: no edema Psych: pleasant, normal affect Skin: intact Neuro:  oriented x3 Musculoskeletal: Right BKA, antalgic gait, arrived in wheelchair, stable 03/21/23    Assessment & Plan:  Right-handed male with history of diabetes  mellitus, hypertension, legally blind, and CKD presents for follow-up up for right BKA, vitamin D  deficiency, and type 2 diabetes   1.  Right transtibial amputation           Discussed plan for prosthesis eval tomorrow          Prescribed PT  Will sent script for water cover for his prosthesis so he can get into the pool, called [employer] to see if this is something that could  be covered for him     2. Gait abnormality             Continue HEP             Cont cane for safety  Continue WC PRN  3. B/l elbow pain  continue Volaren gel  4. Low back pain 2/2 facet arthropathy/spinal stenosis --Discussed Qutenza as an option for neuropathic pain control. Discussed that this is a capsaicin patch, stronger than capsaicin cream. Discussed that it is currently approved for diabetic peripheral neuropathy and post-herpetic neuralgia, but that it has also shown benefit in treating other forms of neuropathy. Provided patient with link to site to learn more about the patch: https://www.clark.biz/. Discussed that the patch would be placed in office and benefits usually last 3 months. Discussed that unintended exposure to capsaicin can cause severe irritation of eyes, mucous membranes, respiratory tract, and skin, but that Qutenza is a local treatment and does not have the systemic side effects of other nerve medications. Discussed that there may be pain, itching, erythema, and decreased sensory function associated with the application of Qutenza. Side effects usually subside within 1 week. A cold pack of analgesic medications can help with these side effects. Blood pressure can also be increased due to pain associated with administration of the patch.  Continue aspirin  -Provided with a pain relief journal and discussed that it contains foods and lifestyle tips to naturally help to improve pain. Discussed that these lifestyle strategies are also very good for health unlike some medications which can have negative  side effects. Discussed that the act of keeping a journal can be therapeutic and helpful to realize patterns what helps to trigger and alleviate pain.    -MRI lumbar spine reviewed and discussed with patient: shows lumbar spinal stenosis and facet arthropathy -discussed starting to go to the gym tomorrow.  -discussed benefits of going to the gym with someone.  -encouraged use of heating pad to improve blood flow to back -discussed medial branch block but he would prefer to see if his pain improves after he gets a more functional prosthetic -Prescribed Zynex Nexwave   5. Leg swelling -discussed BNP is normal, decrease lasix  to 20mg  and monitor for any change in swelling  6. Severe vitamin D  deficiency -continue ergocalciferol  50,000U once per week for 7 weeks  7) CKD:  -discussed most recent kidney injury  8) Type 2 diabetes -discussed benefits of intermittent fasting -discussed risks and benefits of Trulicity  9) HTN: BP reviewed and is stable -Advised checking BP daily at home and logging results to bring into follow-up appointment with PCP and myself. -Reviewed BP meds today.  -Advised regarding healthy foods that can help lower blood pressure and provided with a list: 1) citrus foods- high in vitamins and minerals 2) salmon and other fatty fish - reduces inflammation and oxylipins 3) swiss chard (leafy green)- high level of nitrates 4) pumpkin seeds- one of the best natural sources of magnesium  5) Beans and lentils- high in fiber, magnesium , and potassium 6) Berries- high in flavonoids 7) Amaranth (whole grain, can be cooked similarly to rice and oats)- high in magnesium  and fiber 8) Pistachios- even more effective at reducing BP than other nuts 9) Carrots- high in phenolic compounds that relax blood vessels and reduce inflammation 10) Celery- contain phthalides that relax tissues of arterial walls 11) Tomatoes- can also improve cholesterol and reduce risk of heart  disease 12) Broccoli- good source of magnesium , calcium , and potassium 13)  [place] yogurt: high in potassium and calcium  14) Herbs and spices: Celery seed, cilantro, saffron, lemongrass, black cumin, ginseng, cinnamon, cardamom, sweet basil, and ginger 15) Chia and flax seeds- also help to lower cholesterol and blood sugar 16) Beets- high levels of nitrates that relax blood vessels  17) spinach and bananas- high in potassium  -Provided lise of supplements that can help with hypertension:  1) magnesium : one high quality brand is Bioptemizers since it contains all 7 types of magnesium , otherwise over the counter magnesium  gluconate 400mg  is a good option 2) B vitamins 3) vitamin D  4) potassium 5) CoQ10 6) L-arginine 7) Vitamin C 8) Beetroot -Educated that goal BP is 120/80. -Made goal to incorporate some of the above foods into diet.

## 2023-08-24 ENCOUNTER — Telehealth: Payer: Self-pay | Admitting: Physical Medicine and Rehabilitation

## 2023-08-24 NOTE — Progress Notes (Unsigned)
 MRN : [id number]  Trevor [name] is a 68 y.o. (1955/02/19) male who presents with chief complaint of legs hurt and swell.  History of Present Illness: ***  No outpatient medications have been marked as taking for the 08/28/23 encounter (Appointment) with Jama, Cordella MATSU, MD.    Past Medical History:  Diagnosis Date   Complication of anesthesia    COVID-29 Dec 2018   Diabetes mellitus without complication (HCC)    Heart murmur    Hyperlipidemia    Hypertension    Obesity (BMI 30-39.9) 06/22/2020   Sleep apnea     Past Surgical History:  Procedure Laterality Date   AMPUTATION Right 03/31/2017   Procedure: RIGHT FOOT 1ST AND 2ND RAY AMPUTATION;  Surgeon: Harden Jerona GAILS, MD;  Location: [facility] OR;  Service: Orthopedics;  Laterality: Right;   AMPUTATION Right 04/01/2017   Procedure: AMPUTATION BELOW KNEE;  Surgeon: Harden Jerona GAILS, MD;  Location: [facility] OR;  Service: Orthopedics;  Laterality: Right;   COLONOSCOPY WITH PROPOFOL N/A 10/02/2017   Procedure: COLONOSCOPY WITH PROPOFOL;  Surgeon: Therisa Bi, MD;  Location: [facility] ENDOSCOPY;  Service: Gastroenterology;  Laterality: N/A;   COLONOSCOPY WITH PROPOFOL N/A 11/06/2020   Procedure: COLONOSCOPY WITH PROPOFOL;  Surgeon: Therisa Bi, MD;  Location: [facility] ENDOSCOPY;  Service: Gastroenterology;  Laterality: N/A;   CORNEAL TRANSPLANT     EYE SURGERY      Social History Social History   Tobacco Use   Smoking status: Never   Smokeless tobacco: Never  Vaping Use   Vaping status: Never Used  Substance Use Topics   Alcohol use: No   Drug use: Never    Family History No family history on file.  Allergies  Allergen Reactions   Enalapril Cough     REVIEW OF SYSTEMS (Negative unless checked)  Constitutional: [] Weight loss  [] Fever  [] Chills Cardiac: [] Chest pain   [] Chest pressure   [] Palpitations   [] Shortness of breath when laying flat   [] Shortness of breath with exertion. Vascular:  [] Pain in legs with walking    [x] Pain in legs at rest  [] History of DVT   [] Phlebitis   [x] Swelling in legs   [] Varicose veins   [] Non-healing ulcers Pulmonary:   [] Uses home oxygen   [] Productive cough   [] Hemoptysis   [] Wheeze  [] COPD   [] Asthma Neurologic:  [] Dizziness   [] Seizures   [] History of stroke   [] History of TIA  [] Aphasia   [] Vissual changes   [] Weakness or numbness in arm   [x] Weakness or numbness in leg Musculoskeletal:   [] Joint swelling   [x] Joint pain   [] Low back pain Hematologic:  [] Easy bruising  [] Easy bleeding   [] Hypercoagulable state   [] Anemic Gastrointestinal:  [] Diarrhea   [] Vomiting  [] Gastroesophageal reflux/heartburn   [] Difficulty swallowing. Genitourinary:  [x] Chronic kidney disease   [] Difficult urination  [] Frequent urination   [] Blood in urine Skin:  [] Rashes   [] Ulcers  Psychological:  [] History of anxiety   []  History of major depression.  Physical Examination  There were no vitals filed for this visit. There is no height or weight on file to calculate BMI. Gen: WD/WN, NAD Head: [place]/AT, No temporalis wasting.  Ear/Nose/Throat: Hearing grossly intact, nares w/o erythema or drainage, pinna without lesions Eyes: PER, EOMI, sclera nonicteric.  Neck: Supple, no gross masses.  No JVD.  Pulmonary:  Good air movement, no audible wheezing, no use of accessory muscles.  Cardiac: RRR, precordium not hyperdynamic. Vascular:  scattered varicosities present bilaterally.  Moderate venous stasis changes to the legs bilaterally.  2+ soft pitting edema. CEAP [id number]   Vessel Right Left  Radial Palpable Palpable  Gastrointestinal: soft, non-distended. No guarding/no peritoneal signs.  Musculoskeletal: M/S 5/5 throughout.  No deformity.  Neurologic: CN 2-12 intact. Pain and light touch intact in extremities.  Symmetrical.  Speech is fluent. Motor exam as listed above. Psychiatric: Judgment intact, Mood & affect appropriate for pt's clinical situation. Dermatologic: Venous rashes no ulcers noted.   No changes consistent with cellulitis. Lymph : No lichenification or skin changes of chronic lymphedema.  CBC Lab Results  Component Value Date   WBC 5.3 12/28/2018   HGB 13.5 12/28/2018   HCT 39.6 12/28/2018   MCV 79.8 (L) 12/28/2018   PLT 201 12/28/2018    BMET    Component Value Date/Time   NA 140 10/01/2021 1148   NA 132 (L) 12/11/2012 0836   K 3.8 10/01/2021 1148   K 3.7 12/11/2012 0836   CL 102 10/01/2021 1148   CL 100 12/11/2012 0836   CO2 23 10/01/2021 1148   CO2 25 12/11/2012 0836   GLUCOSE 140 (H) 10/01/2021 1148   GLUCOSE 186 (H) 12/28/2018 1557   GLUCOSE 396 (H) 12/11/2012 0836   BUN 20 10/01/2021 1148   BUN 28 (H) 12/11/2012 0836   CREATININE 1.37 (H) 10/01/2021 1148   CREATININE 1.58 (H) 12/11/2012 0836   CALCIUM 9.3 10/01/2021 1148   CALCIUM 9.2 12/11/2012 0836   GFRNONAA 51 (L) 12/28/2018 1557   GFRNONAA 48 (L) 12/11/2012 0836   GFRAA 59 (L) 12/28/2018 1557   GFRAA 56 (L) 12/11/2012 0836   CrCl cannot be calculated (Patient's most recent lab result is older than the maximum 21 days allowed.).  COAG No results found for: INR, PROTIME  Radiology No results found.   Assessment/Plan There are no diagnoses linked to this encounter.   Cordella Shawl, MD  08/24/2023 1:09 PM

## 2023-08-24 NOTE — Telephone Encounter (Signed)
 Patient would like a referral for therapy sent to [facility] in [place].

## 2023-08-25 ENCOUNTER — Ambulatory Visit: Admitting: Physical Therapy

## 2023-08-28 ENCOUNTER — Ambulatory Visit (INDEPENDENT_AMBULATORY_CARE_PROVIDER_SITE_OTHER): Admitting: Vascular Surgery

## 2023-08-28 ENCOUNTER — Encounter (INDEPENDENT_AMBULATORY_CARE_PROVIDER_SITE_OTHER): Payer: Self-pay | Admitting: Vascular Surgery

## 2023-08-28 ENCOUNTER — Other Ambulatory Visit: Payer: Self-pay | Admitting: Physical Medicine and Rehabilitation

## 2023-08-28 VITALS — BP 147/73 | HR 49 | Ht 76.0 in

## 2023-08-28 DIAGNOSIS — E1169 Type 2 diabetes mellitus with other specified complication: Secondary | ICD-10-CM

## 2023-08-28 DIAGNOSIS — Z89511 Acquired absence of right leg below knee: Secondary | ICD-10-CM | POA: Diagnosis not present

## 2023-08-28 DIAGNOSIS — I890 Lymphedema, not elsewhere classified: Secondary | ICD-10-CM | POA: Diagnosis not present

## 2023-08-28 DIAGNOSIS — E1122 Type 2 diabetes mellitus with diabetic chronic kidney disease: Secondary | ICD-10-CM

## 2023-08-28 DIAGNOSIS — N183 Chronic kidney disease, stage 3 unspecified: Secondary | ICD-10-CM

## 2023-08-28 DIAGNOSIS — G8929 Other chronic pain: Secondary | ICD-10-CM

## 2023-08-28 DIAGNOSIS — E1142 Type 2 diabetes mellitus with diabetic polyneuropathy: Secondary | ICD-10-CM

## 2023-08-28 DIAGNOSIS — E785 Hyperlipidemia, unspecified: Secondary | ICD-10-CM

## 2023-09-01 ENCOUNTER — Encounter (INDEPENDENT_AMBULATORY_CARE_PROVIDER_SITE_OTHER): Payer: Self-pay | Admitting: Vascular Surgery

## 2023-09-01 DIAGNOSIS — I890 Lymphedema, not elsewhere classified: Secondary | ICD-10-CM | POA: Insufficient documentation

## 2023-09-20 DIAGNOSIS — R319 Hematuria, unspecified: Secondary | ICD-10-CM | POA: Insufficient documentation

## 2023-09-28 ENCOUNTER — Ambulatory Visit: Admitting: Podiatry

## 2023-10-04 ENCOUNTER — Telehealth: Payer: Self-pay

## 2023-10-04 DIAGNOSIS — Z860100 Personal history of colon polyps, unspecified: Secondary | ICD-10-CM

## 2023-10-04 NOTE — Telephone Encounter (Signed)
 Pt has requested to reschedule his colonoscopy from 10/10/23.  Informed him that the procedure schedule at [facility] is currently full up until Jan.  I offered to call him back in January but as early as November once the scope schedule for [facility] has been opened for next year to reschedule.  He is okay with any provider performing his colonoscopy.    Procedure cancellation conveyed to Kristin in Endo.  I will call back to reschedule when [facility] Scope schedule is available.  Thanks,  [place], CMA

## 2023-10-04 NOTE — Telephone Encounter (Signed)
 Pt requesting call back to reschedule colonoscopy

## 2023-10-05 ENCOUNTER — Ambulatory Visit: Admitting: Podiatry

## 2023-10-10 ENCOUNTER — Ambulatory Visit: Admission: RE | Admit: 2023-10-10 | Source: Home / Self Care | Admitting: Gastroenterology

## 2023-10-10 SURGERY — COLONOSCOPY
Anesthesia: General

## 2023-10-20 ENCOUNTER — Ambulatory Visit: Admitting: Physical Medicine and Rehabilitation

## 2023-10-26 ENCOUNTER — Ambulatory Visit: Admitting: Podiatry

## 2023-10-26 DIAGNOSIS — M79675 Pain in left toe(s): Secondary | ICD-10-CM

## 2023-10-26 DIAGNOSIS — B351 Tinea unguium: Secondary | ICD-10-CM

## 2023-10-30 ENCOUNTER — Encounter: Admitting: Physical Medicine and Rehabilitation

## 2023-10-31 ENCOUNTER — Encounter: Attending: Physical Medicine and Rehabilitation | Admitting: Physical Medicine and Rehabilitation

## 2023-10-31 DIAGNOSIS — M5441 Lumbago with sciatica, right side: Secondary | ICD-10-CM | POA: Diagnosis not present

## 2023-10-31 DIAGNOSIS — M5442 Lumbago with sciatica, left side: Secondary | ICD-10-CM | POA: Insufficient documentation

## 2023-10-31 DIAGNOSIS — G8929 Other chronic pain: Secondary | ICD-10-CM | POA: Insufficient documentation

## 2023-10-31 NOTE — Progress Notes (Addendum)
 Subjective:    Patient ID: ASIEL CHROSTOWSKI, male    DOB: 01/17/1955, 68 y.o.   MRN: [id number]  An audio/video tele-health visit is felt to be the most appropriate encounter for this patient at this time. This is a follow up tele-visit via phone. The patient is at home. MD is at office. Prior to scheduling this appointment, our staff discussed the limitations of evaluation and management by telemedicine and the availability of in-person appointments. The patient expressed understanding and agreed to proceed.   [name] is a 68 year old man right-handed male who presents for follow-up of diabetes mellitus, hypertension, legally blind, and CKD presents for f/u of right BKA,  low back pain, kidney injury, vitamin D  deficiency, suboptimal magnesium  levels, lower extremity swelling.  1) Low back pain -when he stands too long he feels pain in his pain -he does not want to try amitriptyline as he has heart rate abnormalities -he does take tramadol  and this provides relief -pain has been ok -worst when standing -does not want lidocaine  patch -Interested in tens unit -resting helps the most -pain has been stable.  Currently has pain in his lower back -he feels that this is what most limits his walking -he is not interested in injections -he feels that the lack of mobility in his prosthesis is what most contributes to his back pain.  He asks about the results of his most recent Lumbar MRI He asked whether a more advanced prosthesis would be him to better ambulate.  It hurts mostly when he is walking He gets winded standing and walking He finds the Tramadol  helpful, takes intermediately, does not need a refill today, he tries to limit use as is afraid of addiction, it does help when he takes it -sitting relieves his pain -not doing heating pad -does not feel he can afford extracorporeal shockwave therapy -he cannot apply lidocaine  patch very well, he has nurse to help.  -last time he  worked with PT he felt that PT was dismissive and he was discharged due to lack of progressive.  -joined the gym  2) Right elbow pain -fell on concrete. -Xrs ordered at [facility].    3) Lower extremity swelling: - limited in his walking due to his back pain -lasix  seems to be ineffective- he has been taking 40mg    4) Right BKA -he would like to do PT after getting his new prosthetics -still swelling- uses shrinker -his prosthetist told him about a sleeve for water -getting prosthesis tomorrow -got a new prosthesis and feels like he went from good to bad -when he stands it hurts in the residual limb -he followed with [facility] but they were not able to help   5) Fall -he had no lasting deficits but was embarrassed -he hurt his elbow and there was a lesion   Pain Inventory Average Pain 3 Pain Right Now 1 My pain is intermittent and dull  In the last 24 hours, has pain interfered with the following? General activity 2 Relation with others 0 Enjoyment of life 0 What TIME of day is your pain at its worst? daytime  Sleep (in general) Fair  Pain is worse with: standing some activities Pain improves with: rest and medication Relief from Meds: 10     No family history on file. Social History   Socioeconomic History   Marital status: Single    Spouse name: Not on file   Number of children: Not on file   Years of  education: Not on file   Highest education level: Not on file  Occupational History   Not on file  Tobacco Use   Smoking status: Never   Smokeless tobacco: Never  Vaping Use   Vaping status: Never Used  Substance and Sexual Activity   Alcohol use: No   Drug use: Never   Sexual activity: Not on file  Other Topics Concern   Not on file  Social History Narrative   Not on file   Social Drivers of Health   Financial Resource Strain: Not on file  Food Insecurity: Not on file  Transportation Needs: Not on file  Physical Activity: Not on file  Stress: Not  on file  Social Connections: Not on file   Past Surgical History:  Procedure Laterality Date   AMPUTATION Right 03/31/2017   Procedure: RIGHT FOOT 1ST AND 2ND RAY AMPUTATION;  Surgeon: Harden Jerona GAILS, MD;  Location: [facility] OR;  Service: Orthopedics;  Laterality: Right;   AMPUTATION Right 04/01/2017   Procedure: AMPUTATION BELOW KNEE;  Surgeon: Harden Jerona GAILS, MD;  Location: [facility] OR;  Service: Orthopedics;  Laterality: Right;   COLONOSCOPY WITH PROPOFOL  N/A 10/02/2017   Procedure: COLONOSCOPY WITH PROPOFOL ;  Surgeon: Therisa Bi, MD;  Location: [facility] ENDOSCOPY;  Service: Gastroenterology;  Laterality: N/A;   COLONOSCOPY WITH PROPOFOL  N/A 11/06/2020   Procedure: COLONOSCOPY WITH PROPOFOL ;  Surgeon: Therisa Bi, MD;  Location: [facility] ENDOSCOPY;  Service: Gastroenterology;  Laterality: N/A;   CORNEAL TRANSPLANT     EYE SURGERY     Past Medical History:  Diagnosis Date   Complication of anesthesia    COVID-29 Dec 2018   Diabetes mellitus without complication (HCC)    Heart murmur    Hyperlipidemia    Hypertension    Obesity (BMI 30-39.9) 06/22/2020   Sleep apnea    There were no vitals taken for this visit.  Opioid Risk Score:   Fall Risk Score:  `1  Depression screen [facility] 2/9     08/11/2023   10:23 AM 03/21/2023    1:57 PM 11/14/2022    2:48 PM 08/09/2022    1:11 PM 05/02/2022    2:50 PM 02/01/2022    1:39 PM 11/26/2021   11:07 AM  Depression screen PHQ 2/9  Decreased Interest 0 0 0 0 0  0  Down, Depressed, Hopeless 0 0 0 0 0 0 0  PHQ - 2 Score 0 0 0 0 0 0 0   Review of Systems  Constitutional: Negative.   HENT: Negative.    Eyes: Negative.   Respiratory: Negative.    Cardiovascular: Negative.   Gastrointestinal: Negative.   Endocrine: Negative.        High blood sugar  Genitourinary: Negative.   Musculoskeletal:  Positive for arthralgias, back pain and gait problem.       RT arm  Skin: Negative.   Allergic/Immunologic: Negative.   Neurological:        Tingling    Hematological: Negative.   Psychiatric/Behavioral: Negative.    All other systems reviewed and are negative.     Objective:   Physical Exam  Gen: no distress, normal appearing, weight is 354 lbs HEENT: oral mucosa pink and moist, NCAT Cardio: Reg rate Chest: normal effort, normal rate of breathing Abd: soft, non-distended Ext: no edema Psych: pleasant, normal affect Skin: intact Neuro:  oriented x3 Musculoskeletal: Right BKA, antalgic gait, arrived in wheelchair, stable 03/21/23    Assessment & Plan:  Right-handed male with history  of diabetes mellitus, hypertension, legally blind, and CKD presents for follow-up up for right BKA, vitamin D  deficiency, and type 2 diabetes   1.  Right transtibial amputation           Discussed plan for prosthesis eval tomorrow          Prescribed PT  Will sent script for water cover for his prosthesis so he can get into the pool, called [employer] to see if this is something that could be covered for him     2. Gait abnormality             Continue HEP             Cont cane for safety  Continue WC PRN  3. B/l elbow pain  continue Volaren gel  4. Low back pain 2/2 facet arthropathy/spinal stenosis  discussion of his low back pain, discussed that his pain does not radiate into his legs, discussed that he never heard back from his PT and is interested in doing this  --Discussed Qutenza as an option for neuropathic pain control. Discussed that this is a capsaicin patch, stronger than capsaicin cream. Discussed that it is currently approved for diabetic peripheral neuropathy and post-herpetic neuralgia, but that it has also shown benefit in treating other forms of neuropathy. Provided patient with link to site to learn more about the patch: https://www.clark.biz/. Discussed that the patch would be placed in office and benefits usually last 3 months. Discussed that unintended exposure to capsaicin can cause severe irritation of eyes, mucous membranes,  respiratory tract, and skin, but that Qutenza is a local treatment and does not have the systemic side effects of other nerve medications. Discussed that there may be pain, itching, erythema, and decreased sensory function associated with the application of Qutenza. Side effects usually subside within 1 week. A cold pack of analgesic medications can help with these side effects. Blood pressure can also be increased due to pain associated with administration of the patch.  Continue aspirin  -Provided with a pain relief journal and discussed that it contains foods and lifestyle tips to naturally help to improve pain. Discussed that these lifestyle strategies are also very good for health unlike some medications which can have negative side effects. Discussed that the act of keeping a journal can be therapeutic and helpful to realize patterns what helps to trigger and alleviate pain.    -MRI lumbar spine reviewed and discussed with patient: shows lumbar spinal stenosis and facet arthropathy -discussed starting to go to the gym tomorrow.  -discussed benefits of going to the gym with someone.  -encouraged use of heating pad to improve blood flow to back -discussed medial branch block but he would prefer to see if his pain improves after he gets a more functional prosthetic -Prescribed Zynex Nexwave   5. Leg swelling -discussed BNP is normal, decrease lasix  to 20mg  and monitor for any change in swelling  6. Severe vitamin D  deficiency -continue ergocalciferol  50,000U once per week for 7 weeks  7) CKD:  -discussed most recent kidney injury -discussed that he has kidney disease and wants to avoid Jardiance and Farxiga  8) Type 2 diabetes -discussed benefits of intermittent fasting -discussed risks and benefits of Trulicity  9) HTN: BP reviewed and is stable -Advised checking BP daily at home and logging results to bring into follow-up appointment with PCP and myself. -Reviewed BP meds today.   -Advised regarding healthy foods that can help lower blood pressure and provided with  a list: 1) citrus foods- high in vitamins and minerals 2) salmon and other fatty fish - reduces inflammation and oxylipins 3) swiss chard (leafy green)- high level of nitrates 4) pumpkin seeds- one of the best natural sources of magnesium  5) Beans and lentils- high in fiber, magnesium , and potassium 6) Berries- high in flavonoids 7) Amaranth (whole grain, can be cooked similarly to rice and oats)- high in magnesium  and fiber 8) Pistachios- even more effective at reducing BP than other nuts 9) Carrots- high in phenolic compounds that relax blood vessels and reduce inflammation 10) Celery- contain phthalides that relax tissues of arterial walls 11) Tomatoes- can also improve cholesterol and reduce risk of heart disease 12) Broccoli- good source of magnesium , calcium , and potassium 13) Greek yogurt: high in potassium and calcium  14) Herbs and spices: Celery seed, cilantro, saffron, lemongrass, black cumin, ginseng, cinnamon, cardamom, sweet basil, and ginger 15) Chia and flax seeds- also help to lower cholesterol and blood sugar 16) Beets- high levels of nitrates that relax blood vessels  17) spinach and bananas- high in potassium  -Provided lise of supplements that can help with hypertension:  1) magnesium : one high quality brand is Bioptemizers since it contains all 7 types of magnesium , otherwise over the counter magnesium  gluconate 400mg  is a good option 2) B vitamins 3) vitamin D  4) potassium 5) CoQ10 6) L-arginine 7) Vitamin C 8) Beetroot -Educated that goal BP is 120/80. -Made goal to incorporate some of the above foods into diet.    7 minutes spent in discussion of his low back pain, discussed that his pain does not radiate into his legs, discussed that he never heard back from his PT and is interested in doing this, discussed that he has kidney disease and wants to avoid [place] and  Farxiga

## 2023-10-31 NOTE — Addendum Note (Signed)
 Addended by: LORILEE SVEN SQUIBB on: 10/31/2023 03:15 PM   Modules accepted: Orders

## 2023-11-01 NOTE — Progress Notes (Signed)
  Subjective:  Patient ID: Trevor [name], male    DOB: 1955-06-07,  MRN: [id number]  Chief Complaint  Patient presents with   Nail Problem    Nail trim    68 y.o. male returns for the above complaint.  Patient presents with thickened elongated mycotic toenails of the left lower extremity x5.  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  Patient states that they have been painful when ambulating.  He is not able to debride down himself.  He is a type II diabetic with last A1c of 9.6.  He does not have any secondary complaints today.    Objective:   There were no vitals filed for this visit.  Podiatric Exam: Vascular: dorsalis pedis and posterior tibial pulses are palpable left capillary return is immediate. Temperature gradient is WNL. Skin turgor WNL  Sensorium: Normal Semmes Weinstein monofilament test. Normal tactile sensation left Nail Exam: Pt has thick disfigured discolored nails with subungual debris noted bilateral entire nail hallux through fifth toenails Ulcer Exam: There is no evidence of ulcer or pre-ulcerative changes or infection. Orthopedic Exam: Muscle tone and strength are WNL. No limitations in general ROM. No crepitus or effusions noted. HAV   left.  Hammer toes 2-5   left.  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 No infection or ulcers.  Severe dryness noted to both lower extremity plantar  Assessment & Plan:  Patient was evaluated and treated and all questions answered.  Hammertoes second and third -I explained to patient the etiology of hammertoe measurement options were discussed.  I educated him on taking the pressure off of the hammertoe by getting wider shoes and making shoe gear modification.  Toe protectors were also dispensed to take the pressure off of the hammertoe.  Ultimately patient is not an ideal surgical candidate for hammertoe correction. -Toe protectors were  dispensed  Xerosis/fissuring plantar foot -I explained to the patient the etiology of xerosis and various treatment options were extensively discussed.  I explained to the patient the importance of maintaining moisturization of the skin with application of over-the-counter lotion such as Eucerin or Luciderm.  Given that patient has failed over-the-counter therapy I believe patient will benefit from ammonium lactate  cream.  Ammonium lactate  was sent to the pharmacy.   Swelling to the left lower extremity -Improving.  Onychomycosis with pain x5 -Nails palliatively debrided as below. -Educated on self-care -Penlac  was also dispensed to help with topical application decrease the fungal growth.  I have asked him to apply twice a day for 6 months.  Patient states understanding  Procedure: Nail Debridement Rationale: pain  Type of Debridement: manual, sharp debridement. Instrumentation: Nail nipper, rotary burr. Number of Nails: 5   Procedures and Treatment: Consent by patient was obtained for treatment procedures. The patient understood the discussion of treatment and procedures well. All questions were answered thoroughly reviewed. Debridement of mycotic and hypertrophic toenails, 1 through 5 bilateral and clearing of subungual debris. No ulceration, no infection noted.  Return Visit-Office Procedure: Patient instructed to return to the office for a follow up visit 3 months for continued evaluation and treatment.  Tinnie Forehand, DPM    No follow-ups on file.

## 2023-11-13 ENCOUNTER — Other Ambulatory Visit: Payer: Self-pay

## 2023-11-13 DIAGNOSIS — Z860100 Personal history of colon polyps, unspecified: Secondary | ICD-10-CM

## 2023-11-13 NOTE — Telephone Encounter (Signed)
 Gastroenterology Pre-Procedure Review   Request Date: 10/10/23 Requesting Physician: Dr. jinny   PATIENT REVIEW QUESTIONS: The patient responded to the following health history questions as indicated:     1. Are you having any GI issues? no 2. Do you have a personal history of Polyps? yes (last colonoscopy performed by dr. Therisa 11/06/20) 3. Do you have a family history of Colon Cancer or Polyps? no 4. Diabetes Mellitus?  IMORTANT MEDICATION REMINDERS: -(7) DAYS PRIOR STOP TRULICITY INJECTION -(2) DAYS PRIOR STOP METFORMIN -(1) DAY PRIOR STOP GLIPIZIDE  -TAKE 1/2 OF YOU USUAL AMOUNT OF INSULIN  THE DAY BEFORE 5. Joint replacements in the past 12 months?no 6. Major health problems in the past 3 months?no 7. Any artificial heart valves, MVP, or defibrillator?no    MEDICATIONS & ALLERGIES:    Patient reports the following regarding taking any anticoagulation/antiplatelet therapy:   Plavix, Coumadin, Eliquis, Xarelto, Lovenox , Pradaxa, Brilinta, or Effient? no Aspirin ? yes (81 mg daily)   Patient confirms/reports the following medications:  Current Outpatient Medications  Medication Sig Dispense Refill   ACCU-CHEK AVIVA PLUS test strip USE AS DIRECTED TO CHECK BLOOD SUGAR THREE TIMES A DAY     acetaminophen  (TYLENOL ) 325 MG tablet Take 2 tablets (650 mg total) by mouth every 6 (six) hours as needed for mild pain (or Fever >/= 101).     albuterol  (VENTOLIN  HFA) 108 (90 Base) MCG/ACT inhaler Inhale into the lungs.     ammonium lactate  (AMLACTIN) 12 % lotion Apply 1 application topically as needed for dry skin. 400 g 1   aspirin  EC 81 MG tablet Take 81 mg by mouth daily.     atenolol  (TENORMIN ) 100 MG tablet Take 100 mg by mouth daily.      atorvastatin  (LIPITOR) 40 MG tablet Take 40 mg by mouth daily.     BD INSULIN  SYRINGE U/F 31G X 5/16 1 ML MISC      Blood Glucose Monitoring Suppl (ACCU-CHEK GUIDE) w/Device KIT SMARTSIG:1 device 3 Times Daily     chlorpheniramine-HYDROcodone  (TUSSIONEX) 10-8 MG/5ML Take 5 mLs by mouth 2 (two) times daily as needed.     ciclopirox  (PENLAC ) 8 % solution Apply topically at bedtime. Apply over nail and surrounding skin. Apply daily over previous coat. After seven (7) days, may remove with alcohol and continue cycle. 6.6 mL 1   clobetasol cream (TEMOVATE) 0.05 % Apply topically 2 (two) times daily.     clotrimazole (LOTRIMIN) 1 % cream Apply topically.     diclofenac  Sodium (VOLTAREN ) 1 % GEL Apply 2 g topically 4 (four) times daily. 350 g 1   fluticasone  (FLONASE ) 50 MCG/ACT nasal spray Place 1 spray into both nostrils daily. 30 mL 0   furosemide  (LASIX ) 40 MG tablet Take 40 mg by mouth daily.     glipiZIDE  (GLUCOTROL ) 10 MG tablet Take 10 mg by mouth 2 (two) times daily before a meal.     insulin  lispro (HUMALOG) 100 UNIT/ML injection      lactulose  (CHRONULAC ) 10 GM/15ML solution Take 30 mLs (20 g total) by mouth daily as needed for mild constipation. 120 mL 0   LANTUS  100 UNIT/ML injection Inject 70 Units into the skin daily.     losartan  (COZAAR ) 100 MG tablet Take 100 mg by mouth daily.     meloxicam (MOBIC) 15 MG tablet Take 1 tablet every day by oral route.     metFORMIN (GLUCOPHAGE) 1000 MG tablet Take 1,000 mg by mouth 2 (two) times daily.  mupirocin ointment (BACTROBAN) 2 %      NIFEdipine  (PROCARDIA  XL/NIFEDICAL XL) 60 MG 24 hr tablet      OneTouch Delica Lancets 30G MISC USE 1 LANCET AS DIRECTED THREE TIMES DAILY     polyethylene glycol (MIRALAX  / GLYCOLAX ) packet Take 17 g by mouth daily as needed for mild constipation. 14 each 0   polyethylene glycol-electrolytes (NULYTELY ) 420 g solution Prepare according to package instructions. Starting at 5:00 PM: Drink one 8 oz glass of mixture every 15 minutes until you finish half of the jug. Five hours prior to procedure, drink 8 oz glass of mixture every 15 minutes until it is all gone. Make sure you do not drink anything 4 hours prior to your procedure. 4000 mL 0    spironolactone  (ALDACTONE ) 50 MG tablet Take 50 mg by mouth daily.     traMADol  (ULTRAM ) 50 MG tablet Take 0.5-1 tablets (25-50 mg total) by mouth every 8 (eight) hours as needed for severe pain. 30 tablet 0   TRULICITY 0.75 MG/0.5ML SOPN SMARTSIG:0.5 Milliliter(s) SUB-Q Once a Week     No current facility-administered medications for this visit.    Patient confirms/reports the following allergies:  Allergies  Allergen Reactions   Enalapril Cough    No orders of the defined types were placed in this encounter.   AUTHORIZATION INFORMATION Primary Insurance: 1D#: Group #:  Secondary Insurance: 1D#: Group #:  SCHEDULE INFORMATION: Date:  Time: Location:

## 2023-12-19 ENCOUNTER — Encounter: Payer: Self-pay | Admitting: Physical Medicine and Rehabilitation

## 2023-12-19 ENCOUNTER — Encounter: Attending: Physical Medicine and Rehabilitation | Admitting: Physical Medicine and Rehabilitation

## 2023-12-19 VITALS — BP 136/70 | HR 72 | Ht 76.0 in | Wt 354.0 lb

## 2023-12-19 DIAGNOSIS — G8929 Other chronic pain: Secondary | ICD-10-CM | POA: Insufficient documentation

## 2023-12-19 DIAGNOSIS — E11628 Type 2 diabetes mellitus with other skin complications: Secondary | ICD-10-CM | POA: Diagnosis present

## 2023-12-19 DIAGNOSIS — M5442 Lumbago with sciatica, left side: Secondary | ICD-10-CM | POA: Insufficient documentation

## 2023-12-19 DIAGNOSIS — M25521 Pain in right elbow: Secondary | ICD-10-CM | POA: Insufficient documentation

## 2023-12-19 DIAGNOSIS — M5441 Lumbago with sciatica, right side: Secondary | ICD-10-CM | POA: Insufficient documentation

## 2023-12-19 MED ORDER — LIDOCAINE 5 % EX PTCH: 1.0000 | MEDICATED_PATCH | CUTANEOUS | 0 refills | Status: AC

## 2023-12-19 NOTE — Progress Notes (Signed)
 Subjective:    Patient ID: Trevor [name], male    DOB: 02-Aug-1955, 68 y.o.   MRN: [id number]   Trevor [name] is a 68 year old man right-handed male who presents for follow-up of diabetes mellitus, hypertension, legally blind, and CKD presents for f/u of right BKA,  low back pain, kidney injury, vitamin D  deficiency, suboptimal magnesium  levels, lower extremity swelling.  1) Low back pain -has not tried lidocaine  patch -has not done any PT -when he stands too long he feels pain in his pain -he does not want to try amitriptyline as he has heart rate abnormalities -he does take tramadol  and this provides relief -pain has been ok -worst when standing -does not want lidocaine  patch -Interested in tens unit -resting helps the most -pain has been stable.  Currently has pain in his lower back -he feels that this is what most limits his walking -he is not interested in injections -he feels that the lack of mobility in his prosthesis is what most contributes to his back pain.  He asks about the results of his most recent Lumbar MRI He asked whether a more advanced prosthesis would be him to better ambulate.  It hurts mostly when he is walking He gets winded standing and walking He finds the Tramadol  helpful, takes intermediately, does not need a refill today, he tries to limit use as is afraid of addiction, it does help when he takes it -sitting relieves his pain -not doing heating pad -does not feel he can afford extracorporeal shockwave therapy -he cannot apply lidocaine  patch very well, he has nurse to help.  -last time he worked with PT he felt that PT was dismissive and he was discharged due to lack of progressive.  -joined the gym  2) Right elbow pain -fell on concrete. -Xrs ordered at [facility].    3) Lower extremity swelling: - limited in his walking due to his back pain -lasix  seems to be ineffective- he has been taking 40mg    4) Right BKA -he would like to do PT after  getting his new prosthetics -still swelling- uses shrinker -his prosthetist told him about a sleeve for water -getting prosthesis tomorrow -got a new prosthesis and feels like he went from good to bad -when he stands it hurts in the residual limb -he followed with [facility] but they were not able to help   5) Fall -he had no lasting deficits but was embarrassed -he hurt his elbow and there was a lesion   Pain Inventory Average Pain 3 Pain Right Now 2 My pain is intermittent and dull, burning  In the last 24 hours, has pain interfered with the following? General activity 0 Relation with others 0 Enjoyment of life 0 What TIME of day is your pain at its worst? daytime  Sleep (in general) Fair  Pain is worse with: standing, walking Pain improves with: rest Relief from Meds: N/A    No family history on file. Social History   Socioeconomic History   Marital status: Single    Spouse name: Not on file   Number of children: Not on file   Years of education: Not on file   Highest education level: Not on file  Occupational History   Not on file  Tobacco Use   Smoking status: Never   Smokeless tobacco: Never  Vaping Use   Vaping status: Never Used  Substance and Sexual Activity   Alcohol use: No   Drug use: Never  Sexual activity: Not on file  Other Topics Concern   Not on file  Social History Narrative   Not on file   Social Drivers of Health   Financial Resource Strain: Not on file  Food Insecurity: Not on file  Transportation Needs: Not on file  Physical Activity: Not on file  Stress: Not on file  Social Connections: Not on file   Past Surgical History:  Procedure Laterality Date   AMPUTATION Right 03/31/2017   Procedure: RIGHT FOOT 1ST AND 2ND RAY AMPUTATION;  Surgeon: Harden Jerona GAILS, MD;  Location: [facility] OR;  Service: Orthopedics;  Laterality: Right;   AMPUTATION Right 04/01/2017   Procedure: AMPUTATION BELOW KNEE;  Surgeon: Harden Jerona GAILS, MD;  Location: [facility]  OR;  Service: Orthopedics;  Laterality: Right;   COLONOSCOPY WITH PROPOFOL  N/A 10/02/2017   Procedure: COLONOSCOPY WITH PROPOFOL ;  Surgeon: Therisa Bi, MD;  Location: [facility] ENDOSCOPY;  Service: Gastroenterology;  Laterality: N/A;   COLONOSCOPY WITH PROPOFOL  N/A 11/06/2020   Procedure: COLONOSCOPY WITH PROPOFOL ;  Surgeon: Therisa Bi, MD;  Location: [facility] ENDOSCOPY;  Service: Gastroenterology;  Laterality: N/A;   CORNEAL TRANSPLANT     EYE SURGERY     Past Medical History:  Diagnosis Date   Complication of anesthesia    COVID-29 Dec 2018   Diabetes mellitus without complication (HCC)    Heart murmur    Hyperlipidemia    Hypertension    Obesity (BMI 30-39.9) 06/22/2020   Sleep apnea    BP 136/70 (BP Location: Left Arm, Patient Position: Sitting, Cuff Size: Large)   Pulse 72   Ht 6' 4 (1.93 m)   Wt (!) 354 lb (160.6 kg)   SpO2 95%   BMI 43.09 kg/m   Opioid Risk Score:   Fall Risk Score:  `1  Depression screen [facility] 2/9     08/11/2023   10:23 AM 03/21/2023    1:57 PM 11/14/2022    2:48 PM 08/09/2022    1:11 PM 05/02/2022    2:50 PM 02/01/2022    1:39 PM 11/26/2021   11:07 AM  Depression screen PHQ 2/9  Decreased Interest 0 0 0 0 0  0  Down, Depressed, Hopeless 0 0 0 0 0 0 0  PHQ - 2 Score 0 0 0 0 0 0 0   Review of Systems  Constitutional: Negative.   HENT: Negative.    Eyes: Negative.   Respiratory: Negative.    Cardiovascular: Negative.   Gastrointestinal: Negative.   Endocrine: Negative.        High blood sugar  Genitourinary: Negative.   Musculoskeletal:  Positive for arthralgias, back pain and gait problem.       RT arm  Skin: Negative.   Allergic/Immunologic: Negative.   Neurological:        Tingling   Hematological: Negative.   Psychiatric/Behavioral: Negative.    All other systems reviewed and are negative.     Objective:   Physical Exam  Gen: no distress, normal appearing, weight is 354 lbs HEENT: oral mucosa pink and moist, NCAT Cardio: Reg  rate Chest: normal effort, normal rate of breathing Abd: soft, non-distended Ext: no edema Psych: pleasant, normal affect Skin: intact Neuro:  oriented x3 Musculoskeletal: Right BKA, antalgic gait, arrived in wheelchair, stable 12/19/23    Assessment & Plan:  Right-handed male with history of diabetes mellitus, hypertension, legally blind, and CKD presents for follow-up up for right BKA, vitamin D  deficiency, and type 2 diabetes   1.  Right transtibial amputation           Discussed plan for prosthesis eval tomorrow          Prescribed PT  Will sent script for water cover for his prosthesis so he can get into the pool, called [employer] to see if this is something that could be covered for him     2. Gait abnormality             Continue HEP             Cont cane for safety  Continue WC PRN  3. B/l elbow pain  Discussed that this has improved and he does not need voltaren  gel anymore  4. Low back pain 2/2 facet arthropathy/spinal stenosis  -discussed PT  -lidocaine  5% ordered  discussion of his low back pain, discussed that his pain does not radiate into his legs, discussed that he never heard back from his PT and is interested in doing this  -Discussed Qutenza as an option for neuropathic pain control. Discussed that this is a capsaicin patch, stronger than capsaicin cream. Discussed that it is currently approved for diabetic peripheral neuropathy and post-herpetic neuralgia, but that it has also shown benefit in treating other forms of neuropathy. Provided patient with link to site to learn more about the patch: https://www.clark.biz/. Discussed that the patch would be placed in office and benefits usually last 3 months. Discussed that unintended exposure to capsaicin can cause severe irritation of eyes, mucous membranes, respiratory tract, and skin, but that Qutenza is a local treatment and does not have the systemic side effects of other nerve medications. Discussed that there may  be pain, itching, erythema, and decreased sensory function associated with the application of Qutenza. Side effects usually subside within 1 week. A cold pack of analgesic medications can help with these side effects. Blood pressure can also be increased due to pain associated with administration of the patch.  We are recommending Qutenza 8% capsaicin to treat this patient's pain. Qutenza is the first-line treatment option recommended for diabetic peripheral neuropathy by the AACE and ADA.   Qutenza is a safer option for this patient due to the following: Gabapentin use has been associated with increased risk of dementia Lyrica use has been associated with increased risk of heart failure Cymblata, Venlafaxine, and other SSRIs/SNRIs are associated with increased weight gain Lidocaine  5% has been prescribed/tried   Continue aspirin  -Provided with a pain relief journal and discussed that it contains foods and lifestyle tips to naturally help to improve pain. Discussed that these lifestyle strategies are also very good for health unlike some medications which can have negative side effects. Discussed that the act of keeping a journal can be therapeutic and helpful to realize patterns what helps to trigger and alleviate pain.    -MRI lumbar spine reviewed and discussed with patient: shows lumbar spinal stenosis and facet arthropathy -discussed starting to go to the gym tomorrow.  -discussed benefits of going to the gym with someone.  -encouraged use of heating pad to improve blood flow to back -discussed medial branch block but he would prefer to see if his pain improves after he gets a more functional prosthetic -Prescribed Zynex Nexwave   5. Leg swelling -discussed BNP is normal, decrease lasix  to 20mg  and monitor for any change in swelling  6. Severe vitamin D  deficiency -continue ergocalciferol  50,000U once per week for 7 weeks  7) CKD:  -discussed most recent kidney injury -discussed that  he has kidney disease and wants to avoid Jardiance and Farxiga  8) Type 2 diabetes -discussed benefits of intermittent fasting -discussed risks and benefits of Trulicity -discussed the benefits of vagal nerve stimulation  9) HTN: BP reviewed and is stable -Advised checking BP daily at home and logging results to bring into follow-up appointment with PCP and myself. -Reviewed BP meds today.  -Advised regarding healthy foods that can help lower blood pressure and provided with a list: 1) citrus foods- high in vitamins and minerals 2) salmon and other fatty fish - reduces inflammation and oxylipins 3) swiss chard (leafy green)- high level of nitrates 4) pumpkin seeds- one of the best natural sources of magnesium  5) Beans and lentils- high in fiber, magnesium , and potassium 6) Berries- high in flavonoids 7) Amaranth (whole grain, can be cooked similarly to rice and oats)- high in magnesium  and fiber 8) Pistachios- even more effective at reducing BP than other nuts 9) Carrots- high in phenolic compounds that relax blood vessels and reduce inflammation 10) Celery- contain phthalides that relax tissues of arterial walls 11) Tomatoes- can also improve cholesterol and reduce risk of heart disease 12) Broccoli- good source of magnesium , calcium , and potassium 13) Greek yogurt: high in potassium and calcium  14) Herbs and spices: Celery seed, cilantro, saffron, lemongrass, black cumin, ginseng, cinnamon, cardamom, sweet basil, and ginger 15) Chia and flax seeds- also help to lower cholesterol and blood sugar 16) Beets- high levels of nitrates that relax blood vessels  17) spinach and bananas- high in potassium  -Provided lise of supplements that can help with hypertension:  1) magnesium : one high quality brand is Bioptemizers since it contains all 7 types of magnesium , otherwise over the counter magnesium  gluconate 400mg  is a good option 2) B vitamins 3) vitamin D  4) potassium 5) CoQ10 6)  L-arginine 7) Vitamin C 8) Beetroot -Educated that goal BP is 120/80. -Made goal to incorporate some of the above foods into diet.

## 2024-01-30 ENCOUNTER — Ambulatory Visit: Admitting: Podiatry

## 2024-02-02 ENCOUNTER — Telehealth: Payer: Self-pay
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
## 2024-02-08 ENCOUNTER — Ambulatory Visit (INDEPENDENT_AMBULATORY_CARE_PROVIDER_SITE_OTHER): Admitting: Podiatry

## 2024-02-08 DIAGNOSIS — M79675 Pain in left toe(s): Secondary | ICD-10-CM

## 2024-02-08 DIAGNOSIS — B353 Tinea pedis: Secondary | ICD-10-CM | POA: Diagnosis not present

## 2024-02-08 DIAGNOSIS — B351 Tinea unguium: Secondary | ICD-10-CM | POA: Diagnosis not present

## 2024-02-08 MED ORDER — CLOTRIMAZOLE-BETAMETHASONE 1-0.05 % EX CREA: 1.0000 | TOPICAL_CREAM | Freq: Every day | CUTANEOUS | 0 refills | Status: AC

## 2024-02-08 NOTE — Progress Notes (Signed)
"  °  Subjective:  Patient ID: Trevor [name], male    DOB: [birth year],  MRN: [id number]  Chief Complaint  Patient presents with   Nail Problem    Nail trim     69 y.o. male returns for the above complaint.  Patient presents with thickened elongated mycotic toenails of the left lower extremity x5.  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  Patient states he has secondary complaint of athlete's foot to the left side he would like to discuss treatment options for this pain scale is 0 out of 10.  There is some itching associated with the Objective:   There were no vitals filed for this visit.  Podiatric Exam: Vascular: dorsalis pedis and posterior tibial pulses are palpable left capillary return is immediate. Temperature gradient is WNL. Skin turgor WNL  Sensorium: Normal Semmes Weinstein monofilament test. Normal tactile sensation left Nail Exam: Pt has thick disfigured discolored nails with subungual debris noted bilateral entire nail hallux through fifth toenails Ulcer Exam: There is no evidence of ulcer or pre-ulcerative changes or infection. Orthopedic Exam: Muscle tone and strength are WNL. No limitations in general ROM. No crepitus or effusions noted. HAV   left.  Hammer toes 2-5   left.  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 No infection or ulcers.  Moderate athlete's foot noted to the left foot subjective component of itching noted no open wounds or lesion noted  Assessment & Plan:  Patient was evaluated and treated and all questions answered.  Left athlete's foot - Given the amount of athlete's foot that is present patient will benefit from Lotrisone  cream Lotrisone  cream was sent to the pharmacy encouraged him to comply continue applying twice a day he states understand will do so immediately  Onychomycosis with pain x5 -Nails palliatively debrided as below. -Educated on self-care -Penlac  was also  dispensed to help with topical application decrease the fungal growth.  I have asked him to apply twice a day for 6 months.  Patient states understanding  Procedure: Nail Debridement Rationale: pain  Type of Debridement: manual, sharp debridement. Instrumentation: Nail nipper, rotary burr. Number of Nails: 5   Procedures and Treatment: Consent by patient was obtained for treatment procedures. The patient understood the discussion of treatment and procedures well. All questions were answered thoroughly reviewed. Debridement of mycotic and hypertrophic toenails, 1 through 5 bilateral and clearing of subungual debris. No ulceration, no infection noted.  Return Visit-Office Procedure: Patient instructed to return to the office for a follow up visit 3 months for continued evaluation and treatment.  Franky Blanch, DPM    No follow-ups on file. "

## 2024-02-21 ENCOUNTER — Ambulatory Visit: Admission: RE | Admit: 2024-02-21 | Source: Home / Self Care

## 2024-02-21 ENCOUNTER — Encounter: Admission: RE | Payer: Self-pay | Source: Home / Self Care

## 2024-02-26 ENCOUNTER — Ambulatory Visit (INDEPENDENT_AMBULATORY_CARE_PROVIDER_SITE_OTHER): Admitting: Vascular Surgery

## 2024-03-26 ENCOUNTER — Encounter: Admitting: Physical Medicine and Rehabilitation

## 2024-05-09 ENCOUNTER — Ambulatory Visit: Admitting: Podiatry
